# Patient Record
Sex: Female | Born: 1945
Health system: Southern US, Community
[De-identification: ages and names within clinical notes are randomized; demographics above are authoritative.]

## PROBLEM LIST (undated history)

## (undated) DIAGNOSIS — K648 Other hemorrhoids: Secondary | ICD-10-CM

## (undated) DIAGNOSIS — I739 Peripheral vascular disease, unspecified: Secondary | ICD-10-CM

## (undated) DIAGNOSIS — Z87442 Personal history of urinary calculi: Secondary | ICD-10-CM

## (undated) DIAGNOSIS — Z86010 Personal history of colonic polyps: Secondary | ICD-10-CM

## (undated) DIAGNOSIS — K644 Residual hemorrhoidal skin tags: Secondary | ICD-10-CM

## (undated) DIAGNOSIS — N390 Urinary tract infection, site not specified: Secondary | ICD-10-CM

## (undated) DIAGNOSIS — K6389 Other specified diseases of intestine: Secondary | ICD-10-CM

## (undated) DIAGNOSIS — F4310 Post-traumatic stress disorder, unspecified: Secondary | ICD-10-CM

## (undated) DIAGNOSIS — F0390 Unspecified dementia without behavioral disturbance: Secondary | ICD-10-CM

## (undated) DIAGNOSIS — K589 Irritable bowel syndrome without diarrhea: Secondary | ICD-10-CM

## (undated) DIAGNOSIS — K219 Gastro-esophageal reflux disease without esophagitis: Secondary | ICD-10-CM

## (undated) DIAGNOSIS — I209 Angina pectoris, unspecified: Secondary | ICD-10-CM

## (undated) DIAGNOSIS — T4145XA Adverse effect of unspecified anesthetic, initial encounter: Secondary | ICD-10-CM

## (undated) DIAGNOSIS — M1990 Unspecified osteoarthritis, unspecified site: Secondary | ICD-10-CM

## (undated) DIAGNOSIS — Q782 Osteopetrosis: Secondary | ICD-10-CM

## (undated) DIAGNOSIS — K602 Anal fissure, unspecified: Secondary | ICD-10-CM

## (undated) DIAGNOSIS — G43909 Migraine, unspecified, not intractable, without status migrainosus: Secondary | ICD-10-CM

## (undated) DIAGNOSIS — M797 Fibromyalgia: Secondary | ICD-10-CM

## (undated) DIAGNOSIS — F329 Major depressive disorder, single episode, unspecified: Secondary | ICD-10-CM

## (undated) DIAGNOSIS — Z8639 Personal history of other endocrine, nutritional and metabolic disease: Secondary | ICD-10-CM

## (undated) DIAGNOSIS — T7840XA Allergy, unspecified, initial encounter: Secondary | ICD-10-CM

## (undated) DIAGNOSIS — I499 Cardiac arrhythmia, unspecified: Secondary | ICD-10-CM

## (undated) DIAGNOSIS — I10 Essential (primary) hypertension: Secondary | ICD-10-CM

## (undated) DIAGNOSIS — H269 Unspecified cataract: Secondary | ICD-10-CM

## (undated) DIAGNOSIS — E161 Other hypoglycemia: Secondary | ICD-10-CM

## (undated) DIAGNOSIS — IMO0002 Reserved for concepts with insufficient information to code with codable children: Secondary | ICD-10-CM

## (undated) DIAGNOSIS — F419 Anxiety disorder, unspecified: Secondary | ICD-10-CM

## (undated) DIAGNOSIS — D649 Anemia, unspecified: Secondary | ICD-10-CM

## (undated) DIAGNOSIS — N200 Calculus of kidney: Secondary | ICD-10-CM

## (undated) DIAGNOSIS — T8859XA Other complications of anesthesia, initial encounter: Secondary | ICD-10-CM

## (undated) DIAGNOSIS — K449 Diaphragmatic hernia without obstruction or gangrene: Secondary | ICD-10-CM

## (undated) DIAGNOSIS — F32A Depression, unspecified: Secondary | ICD-10-CM

## (undated) DIAGNOSIS — R51 Headache: Secondary | ICD-10-CM

## (undated) DIAGNOSIS — I340 Nonrheumatic mitral (valve) insufficiency: Secondary | ICD-10-CM

## (undated) DIAGNOSIS — E162 Hypoglycemia, unspecified: Secondary | ICD-10-CM

## (undated) DIAGNOSIS — R011 Cardiac murmur, unspecified: Secondary | ICD-10-CM

## (undated) DIAGNOSIS — H3530 Unspecified macular degeneration: Secondary | ICD-10-CM

## (undated) DIAGNOSIS — K2270 Barrett's esophagus without dysplasia: Secondary | ICD-10-CM

## (undated) DIAGNOSIS — N189 Chronic kidney disease, unspecified: Secondary | ICD-10-CM

## (undated) DIAGNOSIS — E119 Type 2 diabetes mellitus without complications: Secondary | ICD-10-CM

## (undated) DIAGNOSIS — R5383 Other fatigue: Secondary | ICD-10-CM

## (undated) HISTORY — DX: Anxiety disorder, unspecified: F41.9

## (undated) HISTORY — DX: Irritable bowel syndrome, unspecified: K58.9

## (undated) HISTORY — DX: Calculus of kidney: N20.0

## (undated) HISTORY — PX: CHOLECYSTECTOMY: SHX55

## (undated) HISTORY — DX: Unspecified macular degeneration: H35.30

## (undated) HISTORY — DX: Allergy, unspecified, initial encounter: T78.40XA

## (undated) HISTORY — PX: APPENDECTOMY: SHX54

## (undated) HISTORY — DX: Osteopetrosis: Q78.2

## (undated) HISTORY — DX: Depression, unspecified: F32.A

## (undated) HISTORY — DX: Hypoglycemia, unspecified: E16.2

## (undated) HISTORY — PX: ABDOMINAL HYSTERECTOMY: SHX81

## (undated) HISTORY — PX: EYE SURGERY: SHX253

## (undated) HISTORY — PX: LASIK: SHX215

## (undated) HISTORY — DX: Unspecified osteoarthritis, unspecified site: M19.90

## (undated) HISTORY — DX: Fibromyalgia: M79.7

## (undated) HISTORY — DX: Unspecified cataract: H26.9

## (undated) HISTORY — PX: OTHER SURGICAL HISTORY: SHX169

## (undated) HISTORY — DX: Personal history of colonic polyps: Z86.010

## (undated) HISTORY — PX: TUBAL LIGATION: SHX77

## (undated) HISTORY — DX: Diaphragmatic hernia without obstruction or gangrene: K44.9

## (undated) HISTORY — DX: Unspecified dementia, unspecified severity, without behavioral disturbance, psychotic disturbance, mood disturbance, and anxiety: F03.90

## (undated) HISTORY — DX: Essential (primary) hypertension: I10

## (undated) HISTORY — DX: Barrett's esophagus without dysplasia: K22.70

## (undated) HISTORY — DX: Other specified diseases of intestine: K63.89

## (undated) HISTORY — DX: Reserved for concepts with insufficient information to code with codable children: IMO0002

## (undated) HISTORY — DX: Personal history of urinary calculi: Z87.442

## (undated) HISTORY — DX: Gastro-esophageal reflux disease without esophagitis: K21.9

## (undated) HISTORY — DX: Nonrheumatic mitral (valve) insufficiency: I34.0

## (undated) HISTORY — PX: ESOPHAGUS SURGERY: SHX626

## (undated) HISTORY — DX: Other hemorrhoids: K64.8

## (undated) HISTORY — DX: Major depressive disorder, single episode, unspecified: F32.9

## (undated) HISTORY — DX: Urinary tract infection, site not specified: N39.0

## (undated) HISTORY — DX: Anal fissure, unspecified: K60.2

## (undated) HISTORY — DX: Residual hemorrhoidal skin tags: K64.4

## (undated) HISTORY — PX: DILATION AND CURETTAGE OF UTERUS: SHX78

## (undated) HISTORY — DX: Type 2 diabetes mellitus without complications: E11.9

## (undated) HISTORY — PX: TONSILLECTOMY: SUR1361

## (undated) HISTORY — DX: Cardiac murmur, unspecified: R01.1

## (undated) HISTORY — PX: SPINAL CORD STIMULATOR IMPLANT: SHX2422

## (undated) HISTORY — DX: Migraine, unspecified, not intractable, without status migrainosus: G43.909

## (undated) HISTORY — DX: Other fatigue: R53.83

## (undated) HISTORY — DX: Post-traumatic stress disorder, unspecified: F43.10

---

## 1989-01-14 HISTORY — PX: GASTRIC RESTRICTION SURGERY: SHX653

## 1989-01-14 HISTORY — PX: OTHER SURGICAL HISTORY: SHX169

## 1991-01-15 HISTORY — PX: EXPLORATORY LAPAROTOMY: SUR591

## 1991-03-22 HISTORY — PX: CARDIAC CATHETERIZATION: SHX172

## 1997-01-14 HISTORY — PX: GASTRIC BYPASS: SHX52

## 1997-08-07 ENCOUNTER — Emergency Department (HOSPITAL_COMMUNITY): Admission: EM | Admit: 1997-08-07 | Discharge: 1997-08-07 | Payer: Self-pay | Admitting: Emergency Medicine

## 1998-03-22 ENCOUNTER — Encounter: Payer: Self-pay | Admitting: Internal Medicine

## 1998-03-22 ENCOUNTER — Ambulatory Visit (HOSPITAL_COMMUNITY): Admission: RE | Admit: 1998-03-22 | Discharge: 1998-03-22 | Payer: Self-pay | Admitting: Internal Medicine

## 1999-02-21 ENCOUNTER — Emergency Department (HOSPITAL_COMMUNITY): Admission: EM | Admit: 1999-02-21 | Discharge: 1999-02-21 | Payer: Self-pay | Admitting: Emergency Medicine

## 1999-05-31 ENCOUNTER — Other Ambulatory Visit: Admission: RE | Admit: 1999-05-31 | Discharge: 1999-05-31 | Payer: Self-pay | Admitting: Obstetrics and Gynecology

## 1999-06-11 ENCOUNTER — Ambulatory Visit (HOSPITAL_COMMUNITY): Admission: RE | Admit: 1999-06-11 | Discharge: 1999-06-11 | Payer: Self-pay | Admitting: Gastroenterology

## 1999-06-11 ENCOUNTER — Encounter: Payer: Self-pay | Admitting: Gastroenterology

## 1999-08-09 ENCOUNTER — Other Ambulatory Visit: Admission: RE | Admit: 1999-08-09 | Discharge: 1999-08-09 | Payer: Self-pay | Admitting: Gastroenterology

## 1999-08-09 ENCOUNTER — Encounter (INDEPENDENT_AMBULATORY_CARE_PROVIDER_SITE_OTHER): Payer: Self-pay | Admitting: Specialist

## 1999-08-10 ENCOUNTER — Encounter: Payer: Self-pay | Admitting: Gastroenterology

## 1999-08-10 ENCOUNTER — Ambulatory Visit (HOSPITAL_COMMUNITY): Admission: RE | Admit: 1999-08-10 | Discharge: 1999-08-10 | Payer: Self-pay | Admitting: Gastroenterology

## 1999-12-14 ENCOUNTER — Ambulatory Visit (HOSPITAL_COMMUNITY): Admission: RE | Admit: 1999-12-14 | Discharge: 1999-12-14 | Payer: Self-pay | Admitting: Gastroenterology

## 1999-12-14 ENCOUNTER — Encounter: Payer: Self-pay | Admitting: Gastroenterology

## 2000-04-21 ENCOUNTER — Encounter: Admission: RE | Admit: 2000-04-21 | Discharge: 2000-07-20 | Payer: Self-pay | Admitting: Internal Medicine

## 2000-05-13 ENCOUNTER — Ambulatory Visit (HOSPITAL_COMMUNITY): Admission: RE | Admit: 2000-05-13 | Discharge: 2000-05-13 | Payer: Self-pay | Admitting: Internal Medicine

## 2000-05-13 ENCOUNTER — Encounter: Payer: Self-pay | Admitting: Internal Medicine

## 2001-03-18 ENCOUNTER — Other Ambulatory Visit: Admission: RE | Admit: 2001-03-18 | Discharge: 2001-03-18 | Payer: Self-pay | Admitting: Obstetrics and Gynecology

## 2001-04-16 ENCOUNTER — Encounter: Payer: Self-pay | Admitting: Internal Medicine

## 2001-04-16 ENCOUNTER — Encounter (INDEPENDENT_AMBULATORY_CARE_PROVIDER_SITE_OTHER): Payer: Self-pay | Admitting: *Deleted

## 2001-04-16 ENCOUNTER — Ambulatory Visit (HOSPITAL_COMMUNITY): Admission: RE | Admit: 2001-04-16 | Discharge: 2001-04-16 | Payer: Self-pay | Admitting: Gastroenterology

## 2002-12-08 ENCOUNTER — Ambulatory Visit (HOSPITAL_COMMUNITY): Admission: RE | Admit: 2002-12-08 | Discharge: 2002-12-08 | Payer: Self-pay | Admitting: Obstetrics and Gynecology

## 2002-12-31 ENCOUNTER — Encounter: Payer: Self-pay | Admitting: Internal Medicine

## 2003-01-27 ENCOUNTER — Encounter (INDEPENDENT_AMBULATORY_CARE_PROVIDER_SITE_OTHER): Payer: Self-pay | Admitting: Gastroenterology

## 2003-04-03 ENCOUNTER — Emergency Department (HOSPITAL_COMMUNITY): Admission: EM | Admit: 2003-04-03 | Discharge: 2003-04-03 | Payer: Self-pay | Admitting: Emergency Medicine

## 2003-08-30 HISTORY — PX: US ECHOCARDIOGRAPHY: HXRAD669

## 2003-10-28 ENCOUNTER — Encounter: Admission: RE | Admit: 2003-10-28 | Discharge: 2004-01-26 | Payer: Self-pay | Admitting: Neurology

## 2003-10-28 ENCOUNTER — Ambulatory Visit: Payer: Self-pay | Admitting: Psychology

## 2004-04-19 ENCOUNTER — Ambulatory Visit (HOSPITAL_COMMUNITY): Admission: RE | Admit: 2004-04-19 | Discharge: 2004-04-19 | Payer: Self-pay | Admitting: Internal Medicine

## 2004-05-01 ENCOUNTER — Encounter: Admission: RE | Admit: 2004-05-01 | Discharge: 2004-05-01 | Payer: Self-pay | Admitting: Internal Medicine

## 2005-03-27 ENCOUNTER — Ambulatory Visit: Payer: Self-pay | Admitting: Gastroenterology

## 2005-04-22 ENCOUNTER — Encounter: Admission: RE | Admit: 2005-04-22 | Discharge: 2005-04-22 | Payer: Self-pay | Admitting: Internal Medicine

## 2005-04-22 ENCOUNTER — Ambulatory Visit (HOSPITAL_COMMUNITY): Admission: RE | Admit: 2005-04-22 | Discharge: 2005-04-22 | Payer: Self-pay | Admitting: Gastroenterology

## 2005-04-25 ENCOUNTER — Ambulatory Visit: Payer: Self-pay | Admitting: Gastroenterology

## 2005-04-25 ENCOUNTER — Encounter: Payer: Self-pay | Admitting: Internal Medicine

## 2005-04-25 ENCOUNTER — Encounter (INDEPENDENT_AMBULATORY_CARE_PROVIDER_SITE_OTHER): Payer: Self-pay | Admitting: Specialist

## 2005-05-08 ENCOUNTER — Ambulatory Visit (HOSPITAL_COMMUNITY): Admission: RE | Admit: 2005-05-08 | Discharge: 2005-05-08 | Payer: Self-pay | Admitting: Internal Medicine

## 2006-05-08 ENCOUNTER — Encounter: Admission: RE | Admit: 2006-05-08 | Discharge: 2006-05-08 | Payer: Self-pay | Admitting: Internal Medicine

## 2006-06-13 ENCOUNTER — Ambulatory Visit: Payer: Self-pay | Admitting: Gastroenterology

## 2006-06-13 LAB — CONVERTED CEMR LAB
BUN: 17 mg/dL
Creatinine, Ser: 0.9 mg/dL

## 2006-06-16 ENCOUNTER — Ambulatory Visit (HOSPITAL_COMMUNITY): Admission: RE | Admit: 2006-06-16 | Discharge: 2006-06-16 | Payer: Self-pay | Admitting: Gastroenterology

## 2006-06-24 ENCOUNTER — Ambulatory Visit: Payer: Self-pay | Admitting: Internal Medicine

## 2006-09-26 ENCOUNTER — Emergency Department (HOSPITAL_COMMUNITY): Admission: EM | Admit: 2006-09-26 | Discharge: 2006-09-26 | Payer: Self-pay | Admitting: Emergency Medicine

## 2006-10-03 HISTORY — PX: CARDIOVASCULAR STRESS TEST: SHX262

## 2006-10-08 ENCOUNTER — Ambulatory Visit (HOSPITAL_COMMUNITY): Admission: RE | Admit: 2006-10-08 | Discharge: 2006-10-08 | Payer: Self-pay | Admitting: Orthopaedic Surgery

## 2006-10-23 ENCOUNTER — Encounter
Admission: RE | Admit: 2006-10-23 | Discharge: 2006-10-23 | Payer: Self-pay | Admitting: Physical Medicine and Rehabilitation

## 2007-01-15 HISTORY — PX: OTHER SURGICAL HISTORY: SHX169

## 2007-01-21 HISTORY — PX: US ECHOCARDIOGRAPHY: HXRAD669

## 2007-02-04 ENCOUNTER — Ambulatory Visit: Payer: Self-pay | Admitting: Internal Medicine

## 2007-03-02 ENCOUNTER — Ambulatory Visit (HOSPITAL_COMMUNITY): Admission: RE | Admit: 2007-03-02 | Discharge: 2007-03-02 | Payer: Self-pay | Admitting: Orthopaedic Surgery

## 2007-03-10 ENCOUNTER — Ambulatory Visit (HOSPITAL_BASED_OUTPATIENT_CLINIC_OR_DEPARTMENT_OTHER): Admission: RE | Admit: 2007-03-10 | Discharge: 2007-03-10 | Payer: Self-pay | Admitting: Orthopaedic Surgery

## 2007-03-18 DIAGNOSIS — M1990 Unspecified osteoarthritis, unspecified site: Secondary | ICD-10-CM | POA: Insufficient documentation

## 2007-03-18 DIAGNOSIS — F329 Major depressive disorder, single episode, unspecified: Secondary | ICD-10-CM | POA: Insufficient documentation

## 2007-03-18 DIAGNOSIS — IMO0001 Reserved for inherently not codable concepts without codable children: Secondary | ICD-10-CM | POA: Insufficient documentation

## 2007-03-18 DIAGNOSIS — M5137 Other intervertebral disc degeneration, lumbosacral region: Secondary | ICD-10-CM | POA: Insufficient documentation

## 2007-03-18 DIAGNOSIS — M5030 Other cervical disc degeneration, unspecified cervical region: Secondary | ICD-10-CM

## 2007-03-18 DIAGNOSIS — K449 Diaphragmatic hernia without obstruction or gangrene: Secondary | ICD-10-CM | POA: Insufficient documentation

## 2007-03-18 DIAGNOSIS — M791 Myalgia, unspecified site: Secondary | ICD-10-CM

## 2007-03-18 DIAGNOSIS — F411 Generalized anxiety disorder: Secondary | ICD-10-CM | POA: Insufficient documentation

## 2007-03-18 DIAGNOSIS — M51379 Other intervertebral disc degeneration, lumbosacral region without mention of lumbar back pain or lower extremity pain: Secondary | ICD-10-CM

## 2007-03-18 DIAGNOSIS — F4321 Adjustment disorder with depressed mood: Secondary | ICD-10-CM

## 2007-03-18 DIAGNOSIS — K219 Gastro-esophageal reflux disease without esophagitis: Secondary | ICD-10-CM

## 2007-03-18 HISTORY — DX: Diaphragmatic hernia without obstruction or gangrene: K44.9

## 2007-03-18 HISTORY — DX: Adjustment disorder with depressed mood: F43.21

## 2007-03-18 HISTORY — DX: Myalgia, unspecified site: M79.10

## 2007-03-18 HISTORY — DX: Other intervertebral disc degeneration, lumbosacral region without mention of lumbar back pain or lower extremity pain: M51.379

## 2007-03-18 HISTORY — DX: Gastro-esophageal reflux disease without esophagitis: K21.9

## 2007-03-18 HISTORY — DX: Generalized anxiety disorder: F41.1

## 2007-03-18 HISTORY — DX: Other cervical disc degeneration, unspecified cervical region: M50.30

## 2007-04-06 ENCOUNTER — Ambulatory Visit: Payer: Self-pay | Admitting: Internal Medicine

## 2007-06-10 ENCOUNTER — Encounter: Admission: RE | Admit: 2007-06-10 | Discharge: 2007-06-10 | Payer: Self-pay | Admitting: Internal Medicine

## 2007-06-26 ENCOUNTER — Telehealth: Payer: Self-pay | Admitting: Internal Medicine

## 2007-07-01 ENCOUNTER — Ambulatory Visit (HOSPITAL_COMMUNITY): Admission: RE | Admit: 2007-07-01 | Discharge: 2007-07-01 | Payer: Self-pay | Admitting: Internal Medicine

## 2007-07-01 ENCOUNTER — Telehealth: Payer: Self-pay | Admitting: Internal Medicine

## 2007-07-03 ENCOUNTER — Ambulatory Visit (HOSPITAL_COMMUNITY): Admission: RE | Admit: 2007-07-03 | Discharge: 2007-07-03 | Payer: Self-pay | Admitting: Internal Medicine

## 2007-08-14 ENCOUNTER — Ambulatory Visit: Payer: Self-pay | Admitting: Internal Medicine

## 2007-08-21 ENCOUNTER — Telehealth: Payer: Self-pay | Admitting: Internal Medicine

## 2007-10-30 ENCOUNTER — Telehealth: Payer: Self-pay | Admitting: Internal Medicine

## 2007-10-30 ENCOUNTER — Emergency Department (HOSPITAL_COMMUNITY): Admission: EM | Admit: 2007-10-30 | Discharge: 2007-10-30 | Payer: Self-pay | Admitting: Emergency Medicine

## 2007-11-04 ENCOUNTER — Encounter: Payer: Self-pay | Admitting: Internal Medicine

## 2007-11-04 ENCOUNTER — Ambulatory Visit: Payer: Self-pay | Admitting: Hematology

## 2007-11-13 ENCOUNTER — Telehealth: Payer: Self-pay | Admitting: Internal Medicine

## 2007-11-13 ENCOUNTER — Encounter: Payer: Self-pay | Admitting: Internal Medicine

## 2007-12-28 ENCOUNTER — Encounter: Payer: Self-pay | Admitting: Internal Medicine

## 2008-01-03 ENCOUNTER — Emergency Department (HOSPITAL_COMMUNITY): Admission: EM | Admit: 2008-01-03 | Discharge: 2008-01-03 | Payer: Self-pay | Admitting: Emergency Medicine

## 2008-01-06 ENCOUNTER — Other Ambulatory Visit: Payer: Self-pay | Admitting: Orthopedic Surgery

## 2008-01-06 ENCOUNTER — Inpatient Hospital Stay (HOSPITAL_COMMUNITY): Admission: AD | Admit: 2008-01-06 | Discharge: 2008-01-07 | Payer: Self-pay | Admitting: Orthopedic Surgery

## 2008-01-06 ENCOUNTER — Ambulatory Visit: Payer: Self-pay | Admitting: Family Medicine

## 2008-04-13 ENCOUNTER — Encounter (INDEPENDENT_AMBULATORY_CARE_PROVIDER_SITE_OTHER): Payer: Self-pay | Admitting: *Deleted

## 2008-05-30 ENCOUNTER — Ambulatory Visit: Payer: Self-pay | Admitting: Internal Medicine

## 2008-06-01 ENCOUNTER — Ambulatory Visit: Payer: Self-pay | Admitting: Internal Medicine

## 2008-06-01 DIAGNOSIS — Z9283 Personal history of failed moderate sedation: Secondary | ICD-10-CM | POA: Insufficient documentation

## 2008-06-02 ENCOUNTER — Telehealth: Payer: Self-pay | Admitting: Internal Medicine

## 2008-06-10 ENCOUNTER — Encounter: Admission: RE | Admit: 2008-06-10 | Discharge: 2008-06-10 | Payer: Self-pay | Admitting: Internal Medicine

## 2008-06-23 ENCOUNTER — Ambulatory Visit: Payer: Self-pay | Admitting: Internal Medicine

## 2008-06-23 ENCOUNTER — Encounter: Payer: Self-pay | Admitting: Internal Medicine

## 2008-06-23 ENCOUNTER — Ambulatory Visit (HOSPITAL_COMMUNITY): Admission: RE | Admit: 2008-06-23 | Discharge: 2008-06-23 | Payer: Self-pay | Admitting: Internal Medicine

## 2008-06-23 HISTORY — PX: UPPER GASTROINTESTINAL ENDOSCOPY: SHX188

## 2008-06-23 HISTORY — PX: COLONOSCOPY: SHX5424

## 2008-06-24 ENCOUNTER — Ambulatory Visit (HOSPITAL_BASED_OUTPATIENT_CLINIC_OR_DEPARTMENT_OTHER): Admission: RE | Admit: 2008-06-24 | Discharge: 2008-06-24 | Payer: Self-pay | Admitting: Urology

## 2008-06-24 ENCOUNTER — Encounter (INDEPENDENT_AMBULATORY_CARE_PROVIDER_SITE_OTHER): Payer: Self-pay | Admitting: Urology

## 2008-07-05 ENCOUNTER — Encounter: Payer: Self-pay | Admitting: Internal Medicine

## 2008-08-19 ENCOUNTER — Telehealth: Payer: Self-pay | Admitting: Internal Medicine

## 2008-09-16 ENCOUNTER — Emergency Department (HOSPITAL_COMMUNITY): Admission: EM | Admit: 2008-09-16 | Discharge: 2008-09-16 | Payer: Self-pay | Admitting: Emergency Medicine

## 2009-03-27 ENCOUNTER — Encounter: Admission: RE | Admit: 2009-03-27 | Discharge: 2009-03-27 | Payer: Self-pay | Admitting: Internal Medicine

## 2009-07-03 ENCOUNTER — Encounter: Payer: Self-pay | Admitting: Internal Medicine

## 2009-07-03 ENCOUNTER — Emergency Department (HOSPITAL_COMMUNITY): Admission: EM | Admit: 2009-07-03 | Discharge: 2009-07-03 | Payer: Self-pay | Admitting: Emergency Medicine

## 2009-07-19 ENCOUNTER — Observation Stay (HOSPITAL_COMMUNITY): Admission: AD | Admit: 2009-07-19 | Discharge: 2009-07-20 | Payer: Self-pay | Admitting: Urology

## 2009-07-20 ENCOUNTER — Encounter: Payer: Self-pay | Admitting: Internal Medicine

## 2009-09-15 ENCOUNTER — Ambulatory Visit: Payer: Self-pay | Admitting: Cardiovascular Disease

## 2009-11-07 ENCOUNTER — Encounter: Payer: Self-pay | Admitting: Internal Medicine

## 2009-11-23 ENCOUNTER — Encounter: Payer: Self-pay | Admitting: Internal Medicine

## 2009-11-27 ENCOUNTER — Ambulatory Visit: Payer: Self-pay | Admitting: Internal Medicine

## 2009-11-27 DIAGNOSIS — R634 Abnormal weight loss: Secondary | ICD-10-CM | POA: Insufficient documentation

## 2009-11-27 DIAGNOSIS — R110 Nausea: Secondary | ICD-10-CM | POA: Insufficient documentation

## 2010-01-14 HISTORY — PX: STONE EXTRACTION WITH BASKET: SHX5318

## 2010-02-03 ENCOUNTER — Encounter: Payer: Self-pay | Admitting: Obstetrics and Gynecology

## 2010-02-04 ENCOUNTER — Encounter: Payer: Self-pay | Admitting: Internal Medicine

## 2010-02-04 ENCOUNTER — Encounter: Payer: Self-pay | Admitting: Gastroenterology

## 2010-02-06 ENCOUNTER — Encounter: Payer: Self-pay | Admitting: Internal Medicine

## 2010-02-07 ENCOUNTER — Telehealth: Payer: Self-pay | Admitting: Internal Medicine

## 2010-02-09 ENCOUNTER — Ambulatory Visit: Admit: 2010-02-09 | Payer: Self-pay | Admitting: Internal Medicine

## 2010-02-15 NOTE — Letter (Signed)
Summary: Bunnie Pion MD/[facility]  Bunnie Pion MD/[facility]   Imported By: Bubba Hales 12/14/2009 09:45:19  _____________________________________________________________________  External Attachment:    Type:   Image     Comment:   External Document

## 2010-02-15 NOTE — Letter (Signed)
Summary: Colonoscopy Date Change Letter  [facility]  [address]   [place], [place] [address]   Phone: [phone]  Fax: [phone]      February 06, 2010 MRN: [id number]   [facility] [place], [place]  [address]   Dear Ms. Beegle,   Previously you were recommended to have a repeat colonoscopy around this time. Your chart was recently reviewed by Dr. Carlean Purl of [facility]. Follow up colonoscopy is now recommended in June 2015. This revised recommendation is based on current, nationally recognized guidelines for colorectal cancer screening and polyp surveillance. These guidelines are endorsed by the [place] Task Force on Colorectal Cancer as well as numerous other major medical organizations.  Please understand that our recommendation assumes that you do not have any new symptoms such as bleeding, a change in bowel habits, anemia, or significant abdominal discomfort. If you do have any concerning GI symptoms or want to discuss the guideline recommendations, please call to arrange an office visit at your earliest convenience. Otherwise we will keep you in our reminder system and contact you 1-2 months prior to the date listed above to schedule your next colonoscopy.  Thank you,   Silvano Rusk, M.D.  [facility] [phone]

## 2010-02-15 NOTE — Assessment & Plan Note (Signed)
Summary: weight loss--ch.    History of Present Illness Visit Type: Follow-up Visit Primary GI MD: Silvano Rusk MD [facility] Primary Provider:  Tami Lin, MD Chief Complaint: Pt c/o weight loss and nausea x several months. History of Present Illness:   65 yo ww with 4 months of intermittent nausea, urgent post-prandial stools and weight loss. This began after she had interventions for kidney stones. She has recently began to increase carbohydrates to help with weight but her blood sugars have been high at times due to this diet change. "I think this is pushing ne over into diabetes"  She was 120# but is regaining some.    GI Review of Systems    Reports abdominal pain, acid reflux, belching, bloating, dysphagia with liquids, dysphagia with solids, heartburn, loss of appetite, nausea, and  weight loss.     Location of  Abdominal pain: generalized. Weight loss of 35  pounds over 4 months.   Denies chest pain, vomiting, vomiting blood, and  weight gain.      Reports constipation, diarrhea, hemorrhoids, and  rectal pain.     Denies anal fissure, black tarry stools, change in bowel habit, diverticulosis, fecal incontinence, heme positive stool, irritable bowel syndrome, jaundice, light color stool, liver problems, and  rectal bleeding. Preventive Screening-Counseling & Management  Caffeine-Diet-Exercise     Does Patient Exercise: no    Current Medications (verified): 1)  Clonazepam 1 Mg  Tabs (Clonazepam) .... Take 1 Tablet By Mouth Two-Three Times A Day 2)  Imitrex 100 Mg  Tabs (Sumatriptan Succinate) .... Take 1 Tab Twice Weely As Needed 3)  Lidoderm 5 % Ptch (Lidocaine) .... Apply 1 Patch Transdermally Daily 4)  Nasacort Aq 55 Mcg/act  Aers (Triamcinolone Acetonide(Nasal)) .... 2 Sprays Each Nostril Daily 5)  Neurontin 300 Mg  Caps (Gabapentin) .... Take 1 Tablet By Mouth Three Times A Day 6)  Nexium 40 Mg  Cpdr (Esomeprazole Magnesium) .... Take 1 Tablet By Mouth Once A Day 7)   Propranolol-Hctz 40-25 Mg  Tabs (Propranolol-Hctz) .... Take 1 Tablet By Mouth As Needed 8)  Methocarbamol 750 Mg Tabs (Methocarbamol) .... Take 1 Tablet By Mouth At Bedtime As Needed 9)  Vicodin 5-500 Mg Tabs (Hydrocodone-Acetaminophen) .... Take 1-2 Tablets By Mouth Every 4-6 Hours As Needed 10)  Thorazine 25 Mg .... Take 1 Tablet By Mouth As Needed Migraine 11)  Fentanyl 50 Mcg/hr Pt72 (Fentanyl) .... Apply 1 Patch Transdermally Every 3 Days 12)  Vitamin D3 1000 Unit Caps (Cholecalciferol) .... Take 1 Capsule By Mouth Two Times A Day 13)  Miralax   Powd (Polyethylene Glycol 3350) .Marland KitchenMarland KitchenMarland Kitchen 17 G Dissolved in 8oz of Liquid Once Daily 14)  Trazodone Hcl 50 Mg Tabs (Trazodone Hcl) .... Take 1 Tablet By Mouth At Bedtime 15)  Estrace 0.1 Mg/gm Crea (Estradiol) .... Apply Vaginally Once Daily 16)  Pyridium 200 Mg Tabs (Phenazopyridine Hcl) .... Take 1 Tablet By Mouth Three Times A Day As Needed  Allergies (verified): 1)  ! * Ambien 2)  ! Tylox 3)  ! * Tylenol #3 4)  ! * Loricet Plus 5)  ! * Percidan 6)  ! Percocet  Past History:  Past Medical History: Reviewed history from 08/14/2007 and no changes required. DISC DISEASE, LUMBAR (ICD-722.52) DISC DISEASE, CERVICAL (ICD-722.4) FIBROMYALGIA (ICD-729.1) DEPRESSION (ICD-311) MITRAL VALVE PROLAPSE (ICD-424.0) OSTEOARTHRITIS (ICD-715.90) HYPERTENSION (ICD-401.9) ANXIETY (ICD-300.00) IRRITABLE BOWEL SYNDROME (ICD-564.1) GERD (ICD-530.81) HIATAL HERNIA (ICD-553.3) REFLUX ESOPHAGITIS (ICD-530.11) BARRETTS ESOPHAGUS (ICD-530.85) Kidney Stones Urinary Tract Infection  Past Surgical History:  Appendectomy Cholecystectomy Hysterectomy Gastric stapling 1991 Gastrojejunostomy (Roux-En-Y Procedure) 1999, reoperation for obstruction Tubal Ligation Right Knee Arthoscopy Arm surgery-right arm Kidney Stone Removed  Family History: Reviewed history from 06/01/2008 and no changes required. Says 73 immediate (parents, siblings, half-sibs 1 sister  pancreatic cancer mother, father, sister lung cancer multiple half-sibs had cancers grandparents, aunts and uncles all with cancers, total of 67 family members with cancer 84 had colon cancer (2 half-sisters, 3 aunts, 4 uncles, 3 cousins, 2 grandmothers, 1 grandfather) Family History of Uterine Cancer: Aunt Family History of Clotting disorder: Sister Family History of Heart Disease: Sister x 2, Mother,Father, Grandmother Family History of Kidney Disease: Father, Sister x 2 Family History of Breast Cancer: Sister  Social History: Occupation: Medically Retired Patient has never smoked.  Alcohol Use - no Daily Caffeine Use- 1 cup daily Illicit Drug Use - no Patient does not get regular exercise.  Does Patient Exercise:  no  Vital Signs:  Patient profile:   65 year old female Height:      65 inches Weight:      125.38 pounds BMI:     20.94 BSA:     1.62 Pulse rate:   84 / minute Pulse rhythm:   regular BP sitting:   98 / 68  (right arm)  Vitals Entered By: Madlyn Frankel CMA Deborra Medina) (November 27, 2009 11:43 AM)  Physical Exam  General:  thin, NAD Lungs:  Clear throughout to auscultation. Heart:  Regular rate and rhythm; no murmurs, rubs,  or bruits. Abdomen:  soft and nontender without mass BS+ surgical scars present   Impression & Recommendations:  Problem # 1:  WEIGHT LOSS (ICD-783.21) Assessment New Possible causes include hyperglycemia, diarrhea, anorexia/altered eating (most likely). Will see waht metronidazole does.  Problem # 2:  DIARRHEA (ICD-787.91) Assessment: New ? Small intestinal bacterial ovrgrowth ? dumpng try metronidazole for possible bacterial overgrowth - her anatomy (post-gastrectomy) and recent antibiotics increase the likelihood of this  Problem # 3:  NAUSEA (ICD-787.02) Assessment: New could be Calcium, PTH but with normalization does not seem likely functional, small bowel overgrowth all possible.  Problem # 4:  HYPERGLYCEMIA  (ICD-790.29) Assessment: Unchanged #s indicate DM but per Dr. Raye Sorrow she claims swings from 60-400 but not substantiaed  Problem # 5:  ? of HYPERPARATHYROIDISM UNSPECIFIED (ICD-252.00) Assessment: New will confer with [name] - have done so and he indicates PTH studies came back to normal He has told her he doubted an endocrinologist would be able to do anything more but that she could see one if desired  Problem # 6:  ? of CHRONIC FACTITIOUS ILLNESS W/PHYSICAL SYMPTOMS (ICD-301.51) Assessment: Comment Only Over the years she has claimed problems that have not been substantiated by testing or other methods of documenting (family history issues, blood sugars, memory disturbances) this makes it difficult to interpret her problems   Patient Instructions: 1)  Please pick up your medications at your pharmacy.  2)  Let us know if the Metronidazole is not working.  It will take about two weeks for you to see benefits. 3)  We will request labs from Dr. [name] and call you with further follow up after reviewing. 4)  Please follow up with Dr. [name] and your endocrinologist regarding your blood sugars and calcium issues. 5)  Copy sent to : Tami Lin, MD 6)  The medication list was reviewed and reconciled.  All changed / newly prescribed medications were explained.  A complete medication list was provided to the patient /  caregiver. Prescriptions: METRONIDAZOLE 250 MG TABS (METRONIDAZOLE) 1 by mouth three times a day for 10 days  #30 x 0   Entered and Authorized by:   Gatha Mayer MD, [facility]   Signed by:   Gatha Mayer MD, FACG on 11/27/2009   Method used:   Electronically to        [id number] (retail)       [address]       [place], [place]  [address]       Ph: [phone]       Fax: [phone]   RxID:   [id number]   Appended Document: weight loss--ch. cc Dr. Irine Seal and Dr. Armandina Gemma

## 2010-02-15 NOTE — Letter (Signed)
Summary: Nani Skillern PAC/[facility]  Nani Skillern PAC/[facility]   Imported By: Bubba Hales 12/14/2009 09:54:06  _____________________________________________________________________  External Attachment:    Type:   Image     Comment:   External Document

## 2010-02-15 NOTE — Progress Notes (Signed)
Summary: Received Recall letter   Phone Note Call from Patient Call back at Home Phone [phone]   Call For: Dr Carlean Purl Reason for Call: Talk to Nurse Summary of Call: Received a Recall letter and feels that her Procedure should not have been changed to 06-2013. All her problems have come back with a vengance. Her throat is closing up and is having trouble swallowing. Has constipation. Upper & lower Abd Pain & cramping. Severe weight loss, went from 156 to 120pounds.Her Rectum doesnt close so when she has a BM she ends up with a big mess. Feels that it would be benefical to her health if she were to have an ECL Initial call taken by: Irwin Brakeman [facility],  February 07, 2010 11:03 AM  Follow-up for Phone Call        patient will come in on Friday at 4:00 to further discuss. Follow-up by: Barb Merino RN, [place],  February 07, 2010 3:27 PM

## 2010-02-15 NOTE — Discharge Summary (Signed)
Summary: 7/6-07/20/09    NAME:  Kayla [name], Kayla [name] NO.:  [id number]      MEDICAL RECORD NO.:  [id number]          PATIENT TYPE:  OBV      LOCATION:  [place]                         FACILITY:  [facility]      PHYSICIAN:  Marshall Cork. Jeffie Pollock, M.D.    DATE OF BIRTH:  [birth year]      DATE OF ADMISSION:  07/19/2009   DATE OF DISCHARGE:  07/20/2009                                  DISCHARGE SUMMARY      Briefly, Kayla [name] is a 65 year old female who has had a several-day history   of right flank pain.  CT urogram demonstrated multiple ureteral stones,   including possible 3-mm right ureteral stone.  She also has an elevated   PTH with a calcium of 9.3 and is to have a surgical consultation.      PAST HISTORY:  Her past history is pertinent for:   1. Anxiety.   2. Arthritis.   3. Barrett esophagitis.   4. Cerebral artery aneurysm.   5. Fibromyalgia.   6. Heartburn.   7. Migraine headache.   8. Mitral valve disorder.   9. Osteoporosis.   10.Peptic ulcer.   11.Post-traumatic stress disorder.   12.Rhythm disorders.      SURGICAL HISTORY:  Surgical history is significant for an appendectomy,   prior cystoscopy with biopsy, prior cystoscopy with hydrodistention,   cholecystectomy, hysterectomy, tonsillectomy, and tubal ligation.      ADMISSION MEDICATIONS:  Include Celebrex, chlorpromazine, clindamycin,   clonazepam, fentanyl, Fioricet , Fosamax, hydrocodone, ibuprofen,   Imitrex, Lidoderm, Nasonex, Neurontin, Nexium, propranolol, Robaxin, and   trazodone.      ALLERGIES:  She has allergies to:   1. AMBIEN.   2. CODEINE.   3. LORCET.   4. PERCOCET.   5. TYLENOL.   6. TYLOX.      For additional details of the history and physical, see the office H and   P.  Her urinalysis was negative.  The CT scan in our office on the day   of admission revealed 2 obstructing right ureteral calculi with moderate   right hydroureter and hydronephrosis as well as right nephrolithiasis.   Because  of her pain, felt to require ureteroscopy and stent placement.      HOSPITAL COURSE:  On the day of admission, she was taken to the   operating room, where she underwent left retrograde pyelogram with   interpretation left ureteroscopic stone extraction, placement of a left   double-J stent, only the single stone was found during the procedure,   the 2 additional stones were not noted despite ureteroscopy to the   kidney.  Postoperatively, she did well, and on July 7 she was felt to be   ready for discharge home with final diagnosis for right ureteral stone.   There were no complications.  Her discharge medications include Vicodin,   Pyridium, and home medications.  She was instructed to follow up in 1   week.  She has a stent, and cystoscopy will be performed at followup.  DISPOSITION:  To home.      CONDITION:  Improved.               Marshall Cork. Jeffie Pollock, M.D.            JJW/MEDQ  D:  08/07/2009  T:  08/07/2009  Job:  [id number]      Electronically Signed by Irine Seal M.D. on 08/17/2009 12:52:19 PM

## 2010-02-21 ENCOUNTER — Other Ambulatory Visit: Payer: Self-pay | Admitting: Internal Medicine

## 2010-02-21 DIAGNOSIS — Z1231 Encounter for screening mammogram for malignant neoplasm of breast: Secondary | ICD-10-CM

## 2010-03-29 ENCOUNTER — Ambulatory Visit: Payer: Self-pay

## 2010-04-01 LAB — URINALYSIS, ROUTINE W REFLEX MICROSCOPIC
Nitrite: NEGATIVE
Protein, ur: NEGATIVE mg/dL
Specific Gravity, Urine: 1.012 (ref 1.005–1.030)
Urobilinogen, UA: 0.2 mg/dL (ref 0.0–1.0)

## 2010-04-09 ENCOUNTER — Ambulatory Visit
Admission: RE | Admit: 2010-04-09 | Discharge: 2010-04-09 | Disposition: A | Discharge status: Home / Self Care | Payer: Medicare Other | Source: Ambulatory Visit | Attending: Internal Medicine | Admitting: Internal Medicine

## 2010-04-09 DIAGNOSIS — Z1231 Encounter for screening mammogram for malignant neoplasm of breast: Secondary | ICD-10-CM

## 2010-04-23 LAB — URINALYSIS, ROUTINE W REFLEX MICROSCOPIC
Bilirubin Urine: NEGATIVE
Ketones, ur: NEGATIVE mg/dL
Nitrite: NEGATIVE
Protein, ur: NEGATIVE mg/dL
pH: 5 (ref 5.0–8.0)

## 2010-04-23 LAB — DIFFERENTIAL
Basophils Absolute: 0 10*3/uL (ref 0.0–0.1)
Basophils Relative: 0 % (ref 0–1)
Eosinophils Absolute: 0.5 10*3/uL (ref 0.0–0.7)
Eosinophils Relative: 7 % — ABNORMAL HIGH (ref 0–5)
Neutrophils Relative %: 56 % (ref 43–77)

## 2010-04-23 LAB — CBC
HCT: 40.1 % (ref 36.0–46.0)
MCV: 91.7 fL (ref 78.0–100.0)
Platelets: 226 10*3/uL (ref 150–400)
RDW: 13.1 % (ref 11.5–15.5)
WBC: 6.9 10*3/uL (ref 4.0–10.5)

## 2010-04-23 LAB — BASIC METABOLIC PANEL
BUN: 6 mg/dL (ref 6–23)
Chloride: 107 mEq/L (ref 96–112)
Creatinine, Ser: 0.56 mg/dL (ref 0.4–1.2)
Glucose, Bld: 102 mg/dL — ABNORMAL HIGH (ref 70–99)
Potassium: 3.7 mEq/L (ref 3.5–5.1)

## 2010-04-23 LAB — GLUCOSE, CAPILLARY: Glucose-Capillary: 99 mg/dL (ref 70–99)

## 2010-05-16 ENCOUNTER — Ambulatory Visit: Payer: Self-pay

## 2010-05-29 NOTE — Op Note (Signed)
Kayla [name], Kayla [name]              ACCOUNT NO.:  [id number]   MEDICAL RECORD NO.:  [id number]          PATIENT TYPE:  AMB   LOCATION:  ENDO                         FACILITY:  [facility]   PHYSICIAN:  Ronald L. Rosana Hoes, M.D.  DATE OF BIRTH:  12/10/1945   DATE OF PROCEDURE:  06/24/2008  DATE OF DISCHARGE:                               OPERATIVE REPORT   DIAGNOSIS:  Questionable interstitial cystitis.   OPERATIVE PROCEDURE:  Cystourethroscopy, hydraulic bladder distention  and bladder biopsy.   SURGEON:  Dr. Tresa Endo.   ANESTHESIA:  LMA.   BLOOD LOSS:  Negligible.   TUBES:  None.   COMPLICATIONS:  None.   FINDINGS:  A 1000 mL capacity bladder at 80 cm of water pressure and no  glomerulations were noted.   INDICATIONS FOR PROCEDURE:  [name] is a lovely 65 year old white  female who has had chronic recurrent urinary tract infections.  She  notes that the infections are better while she is on prophylaxis,  although it does not completely eliminate them.  She also has chronic  urgency, frequency, pelvic pain and burning.  With this symptomatology,  we felt that she could possibly have interstitial cystitis and felt like  this procedure was indicated from both a diagnostic and therapeutic  approach.  She understands the risks, benefits and alternatives and  wishes to proceed.   PROCEDURE IN DETAIL:  The patient was placed in supine position after  proper LMA anesthesia and was placed in the dorsal lithotomy position  and prepped and draped with Betadine in a sterile fashion.  Cystourethroscopy was performed with a 22.5 [place] Olympus panendoscope.  Utilizing the 12 and 70 lenses, the bladder was carefully inspected and  noted be without lesions.  Efflux of clear urine was noted from the  normally placed ureteral orifices bilaterally.  Hydraulic bladder  distention was then performed to 80 cm of water pressure with sterile  water.  She was noted to have exactly 1000 mL  capacity bladder and it  was relieved, there were really no cracks and no glomerulations and  certainly no Hunter's ulcers noted.  A biopsy was then obtained from the  posterior midline with  the cold cup biopsy forceps, submitted to pathology and the base was  cauterized with Bugbee coagulation cautery.  There were no other lesions  noted.  The bladder was drained.  The panendoscope was removed and the  patient was taken to the recovery room stable.      [place] Rosana Hoes, M.D.  Electronically Signed     RLD/MEDQ  D:  06/24/2008  T:  06/24/2008  Job:  [id number]

## 2010-05-29 NOTE — Assessment & Plan Note (Signed)
[place] OFFICE NOTE   NAME:Kayla [name], Kayla [name]                     MRN:          [id number]  DATE:06/13/2006                            DOB:          [birth year]    Kayla [name] comes in on May 30, says she needs her annual endo for Barrett's  esophagus, and she is having more dysphagia, having increased reflux,  right upper quadrant pain occasionally, as well.  She also noted some  odynophagia, some pain in the same place she had prior to her  cholecystectomy.  She also describes some problems with her  hypoglycemias.  She is very anxious because she says 30 family members  have died in the past several years, and the majority of these were  secondary to malignancies.   Ivalene is on a number of medications including clorazepan, Nexium 1 a  day, Effexor 75, Fioricet, Darvocet, Soma compound, Imitrex, DayPro,  __________, propranolol, Topamax, Relpax, Lidoderm patches, Nasacort,  Risperdal.   Kayla [name] has known IBS with a history of megacolon.  Has GERD and is status  post Roux-en-Y, cholecystectomy, has diabetes, short segment Barrett's,  has mild osteoarthritis, mitral valve prolapse, anxiety/depression,  especially anxiety.  It is obvious today.  Her last endoscopy was in  2007, revealed a 4-cm hiatal hernia with a short segment Barrett's, and  she was dilated with a #54 Maloney dilator.  Last colonoscopic  examination was in 2005 and was essentially normal.  She is scheduled  for a routine followup in 7 years which would be 2012.   PHYSICAL EXAMINATION:  Weight was 149, blood pressure 106/60, pulse 60  and regular.  Oropharynx was negative.  Neck was negative.  Heart revealed a regular rhythm, there was slightly decreased heart  sounds.  Lungs were clear to auscultation.  Supraclavicular area and thyroid were normal to palpation.  Inguinal  area was negative to palpation.  Abdomen was soft, no masses or  organomegaly, nontender except possibly  in the right upper quadrant epigastric area.   IMPRESSION:  As above plus abdominal pains.   RECOMMENDATIONS:  1. Get CT scan of the abdomen and pelvis, and a barium swallow with a      tablet to be complete.  2. I suspect this is somewhat of a motility disorder.  She does have a      significant hiatal hernia which is post Roux-en-Y, and she has this      abdominal pain which is difficult to define.  That      is why I recommended that she have a CT scan which has not been      done in quite some      time, and she is so anxious about cancer, most likely cancer phobia      because of family members.  I thought this would certainly be      indicated.     Clarene Reamer, MD  Electronically Signed    SML/MedQ  DD: 06/13/2006  DT: 06/13/2006  Job #: [id number]

## 2010-05-29 NOTE — Assessment & Plan Note (Signed)
[facility] OFFICE NOTE   NAME:Kayla [name], Kayla [name]                     MRN:          [id number]  DATE:02/04/2007                            DOB:          28-Mar-1945    REQUESTING PHYSICIAN:  Fenton Malling. Linna Darner, MD.  Note that the patient wrote  Dr. [name], who was her former physician, but Dr. Linna Darner referred  her.  Dr. [name] is no longer with [facility].   REASON FOR CONSULTATION:  Abdominal pain.   ASSESSMENT:  63. A 65 year old, white woman, with known irritable bowel syndrome and      chronic, recurrent abdominal pain some of which could be from      adhesions.  I think that that is likely what is going on.  She had      a computerized tomography scan last summer, she had a colonoscopy      in 2005.  I think she has a multifactorial, chronic abdominal pain      problem.  She is not tender on abdominal exam today.  2. She also has Barrett's esophagus and a previous gastrojejunostomy      procedure, which probably predisposes her to persistent bile      reflux.  Barrett's esophagus was biopsied in 2007 and showed no      dysplasia.   PLANS:  1. I do not think any endoscopic evaluation or other investigations      are needed right now.  2. A trial of Align daily.  3. MiraLax daily, she says this is too expensive and I will prescribe      it to see if that helps as she gets her medicines for free through      [facility].  4. Postgastrectomy syndrome diet handout was given to the patient.  5. She is reassured at this time.  6. Consider antibiotic therapy for possible small bowel bacterial      overgrowth.  7. Regarding her reflux, she complains of aspiration and persistent      reflux problems where she chokes and strangles on fluid and has to      sit up at night.  She says it burns quite a bit.  I do not think      she has had pneumonia from this.  This is a real problem it sounds  like, and I think if she moves her bowels somewhat better perhaps      she would have less of this, i.e., less pressure phenomenon.  We      will see how she responds to the daily MiraLax.  Repeat surgery for      her known hiatal hernia could have some effect here, though.  We      should try to use that as a last resort given her overall      constellation of problems.  8. Cancer phobia, she has had numerous relatives die of cancer, which      has spooked her into thinking she could have cancer.  She is      reassured about this today.  9. Repeat the EGD in 2010.  10.Repeat a colonoscopy perhaps in 2012.  She has a long, redundant      colon and a CT colonoscopy may be appropriate.  Propofol sedation      may be necessary as well.  Investigate signs or symptoms if there      is a real change sooner than that, but I really think she is having      an exacerbation of chronic problems.   Please see my medical history form for full details of her symptoms at  this time.   PAST MEDICAL HISTORY:  1. Barrett's esophagus.  2. Hiatal hernia.  3. Prior gastrojejunostomy procedure.  4. Gastroesophageal reflux disease.  5. Recent UTI, December 2008, treated.  6. Irritable bowel syndrome with chronic constipation.  7. Anxiety.  8. Hypertension.  9. Osteoarthritis.  10.Mitral valve prolapse.  11.Depression.  12.Prior cholecystectomy.  13.Note, her gastrojejunostomy is a Roux-En-Y procedure.  14.History of megacolon.  Dr. Velora Heckler thought she had a motility      disturbance.  15.Chronic reflux and aspiration problems, but I do not think that      this has led to pneumonia problems.  16.Fibromyalgia.  17.She does suffer from some hypoglycemia at times.  18.Prior tubal ligation.  19.Prior appendectomy.  20.Prior hysterectomy.  21.Cervical spine disease.  22.Lumbar spine disease.   FAMILY HISTORY:  Numerous relatives have died of ovarian cancers,  pancreatic cancer, breast cancer, lung  cancer, and this has left a  profound effect on her and scared her quite a bit.  No colon cancer  reported in first-degree relatives though apparently aunts and uncles  did.   SOCIAL HISTORY:  She is married, she is medically retired from  [employer], she has two adopted granddaughters she is  caring for, and she has her husband at home, and finances are tight.  No  alcohol, tobacco, or drugs.   REVIEW OF SYSTEMS:  See my medical history form for full details.   PHYSICAL EXAMINATION:  GENERAL:  An overtly anxious white woman, who  perseverates and talks in circles.  VITAL SIGNS:  Weight 158 pounds, pulse 82, blood pressure 94/62.  EYES:  Anicteric.  LUNGS:  Clear.  HEART:  S1 S2, no murmurs, rubs, or gallops, I do not hear a click  today.  ABDOMEN:  Soft and nontender without organomegaly or mass, there are  surgical scars present.  PSYCH:  She is alert and oriented x3, she is easy to reassure though she  is talkative and anxious.   I appreciate the opportunity to care for this patient.   Please see my handwritten worksheet in the office chart for full  details.     Gatha Mayer, MD,FACG  Electronically Signed    CEG/MedQ  DD: 02/04/2007  DT: 02/04/2007  Job #: [id number]   cc:   Posey Boyer, MD

## 2010-05-29 NOTE — Op Note (Signed)
Kayla [name], EID NO.:  [id number]   MEDICAL RECORD NO.:  [id number]          PATIENT TYPE:  INP   LOCATION:  5034                         FACILITY:  [facility]   PHYSICIAN:  Daryll Brod, M.D.       DATE OF BIRTH:  19-Jul-1945   DATE OF PROCEDURE:  01/06/2008  DATE OF DISCHARGE:                               OPERATIVE REPORT   PREOPERATIVE DIAGNOSIS:  Comminuted intra-articular fracture, right  distal radius.   POSTOPERATIVE DIAGNOSIS:  Comminuted intra-articular fracture, right  distal radius.   OPERATION:  Open reduction and internal fixation, right distal radius  fracture with distal volar radius plate, hand innovation, standard right  plate.   SURGEON:  Daryll Brod, MD   ASSISTANT:  Odessa Fleming, RN   ANESTHESIA:  Axillary general.   ANESTHESIOLOGIST:  Glynda Jaeger, MD   HISTORY:  The patient is a 65 year old female who suffered a fall,  suffering with comminuted intra-articular fracture of her right distal  radius.  She was seen in emergency room where this was splinted,  referred to Dr. [name], who has referred her for continued care.  X-  rays reveal a comminuted intra-articular fracture and displaced right  distal radius.  Plan is for open reduction and internal fixation.  She  is aware of risks and complications including infection, recurrence of  injury to arteries, nerves, tendons, incomplete relief of symptoms,  dystrophy, possibility of carpal tunnel syndrome, and possibility of  nonunion.  The patient is seen in the preoperative area, the extremity  was marked by both the patient and surgeon, the antibiotic given.   PROCEDURE:  The patient was brought to the operating room where an  axillary block was carried out without difficulty.  She was also given a  LMA under the direction of Dr. Linna Caprice, general anesthetic was then  instituted.  She was prepped using a DuraPrep, supine position, right  arm free.  A time-out was taken.  A  manipulation of the distal radius  was performed.  X-rays reveal reduction in both AP and lateral direction  with reconstitution of normal length, tilt, and inclination.  The limb  was exsanguinated with an Esmarch bandage.  Tourniquet placed high and  the arm was inflated to 250 mmHg, a volar radial incision was made for  application of distal volar radial plate.  This was carried down through  subcutaneous tissue.  The dissection carried through the flexor carpi  radialis tendon sheath.  The radial artery was identified and protected.  The FPL muscle and pronator quadratus were identified.  The pronator  quadratus was then incised on its radial aspect.  The periosteum was  then elevated along with the pronator quadratus and fracture was  immediately apparent.  The brachial radialis was then step-cut.  The  origin of the flexor pollicis longus was then elevated off from the  radius, this allowed visualization of the plate.  A standard right DVR  plate was then selected.  This was placed, positioned just proximal to  the watershed area on the distal radius and fixed with a single 12-mm  standard screw in the gliding hole after drilling with 2.5-mm drill bit,  this fixed it proximally.  The distal margin was then fixed distally  with a K-wire.  X-rays confirmed that this was position well, but  slightly proximal.  This was then relieved and pushed slightly distally.  Again, pinned x-rays revealed the plate and pin lied in good position.  The 4/5 K-wire was placed through the radial styloid pin tract fixing  the fracture in both AP and lateral direction.  The ulnar screws were  then placed, these measured 20 mm.  These were fully threaded to fix the  ulnar fragments of the distal radius.  This firmly fixed, these x-rays  are confirmed that these were not in the joint.  The remaining central  fixed angled components were placed.  These all measured approximately  20-22 mm and were placed as  non-threaded pegs.  The radial 2 screws were  placed.  These were fully threaded.  All were locking.  X-rays, AP,  lateral, and oblique confirmed that none were in the joint.  The  fracture was firmly fixed distally with restoration of length, tilt, and  inclination.  The remaining proximal fixation screws were placed into  the radial diaphysis.  These, each measured 14-12 mm.  This firmly fixed  the fracture in position, confirmed with OEC image intensification, the  wound was copiously irrigated with saline.  The pronator quadratus was  then repaired with figure-of-eight 4-0 Vicryl sutures as was the FPL.  The subcutaneous tissue was closed with interrupted 4-0 Vicryl and the  skin with interrupted 5-0 Vicryl Rapide.  Hemostasis was assured with  bipolar cauterization, the radial artery was protected throughout the  procedure.  A dorsal palmar splint was applied.  The patient tolerated  the procedure well.  On deflation of the tourniquet, all fingers  immediately pinked.  She was taken to the recovery room for observation  in satisfactory condition.  She will be admitted for overnight stay for  pain control and that she has a long history of pain problems.  She will  be discharged to home on Vicodin and her standard medicines.           ______________________________  Daryll Brod, M.D.     GK/MEDQ  D:  01/06/2008  T:  01/07/2008  Job:  [id number]   cc:   Linton Ham. [name], M.D.

## 2010-05-29 NOTE — Consult Note (Signed)
Kayla [name], Kayla [name] NO.:  [id number]   MEDICAL RECORD NO.:  [id number]          PATIENT TYPE:  INP   LOCATION:  5034                         FACILITY:  [facility]   PHYSICIAN:  Cherene Altes, M.D.DATE OF BIRTH:  Nov 18, 1945   DATE OF CONSULTATION:  DATE OF DISCHARGE:                                 CONSULTATION   REFERRING PHYSICIAN:  Dr. Fredna Dow.   PRIMARY CARE PHYSICIAN:  Robert P. [name], M.D.   CHIEF COMPLAINT:  Status post wrist fracture with corrective surgery  today in the setting of multiple chronic medical problems.   HISTORY OF PRESENT ILLNESS:  Ms. Kayla [name] is a pleasant 65-year-  old female with a multitude of chronic medical problems.  She underwent  a surgery correction for a distal radial and ulnar fracture today per  Dr. Fredna Dow.  I was contacted by Dr. Levell July office to assist with  perioperative medical management of her chronic medical problems during  her hospital stay.  At the time of my evaluation, the patient has been  transferred from the surgical center over to [facility] [address].  She  is resting comfortably in her bed with her right arm suspended and  dressed in her postoperative dressings.  At the present time, the  patient has no specific complaints.  She confirms that her fall on  January 03, 2008, which resulted in her fractures was actually a fall  and was not a presyncopal spell or a full episode of syncope.   REVIEW OF SYSTEMS:  At the present time, the patient has no specific  complaints.  She does relate multiple chronic medical problems which are  detailed in the past medical history below.   PAST MEDICAL HISTORY:  1. Mitral valve prolapse with only mild trace mitral valve regurg via      echocardiogram in January 2009, per Dr. Liam Rogers.  2. Hypertension.  3. Barrett's esophagus with severe gastroesophageal reflux disease.  4. Fibromyalgia.  5. History of cholelithiasis, status post cholecystectomy.  6.  Obesity, status post gastric bypass with a Roux-en-Y in 1999.  7. Mild diastolic dysfunction via echocardiogram in January 2009.  8. Normal nuclear medicine stress test in September 2008.  9. Migraine headaches - reported as debilitating.  10.Short and long-term memory deficits per patient's report.  11.Chronic constipation.  12.Impaired glucose tolerance with propensity for severe hypoglycemia.  13.Anxiety disorder.  14.Status post total abdominal hysterectomy/bilateral salpingo-      oophorectomy.  15.Status post tonsillectomy and adenoidectomy.  16.Status post appendectomy.  17.PTSD.  18.Osteoarthritis with osteoporosis.   OUTPATIENT MEDICATIONS:  1. Inderal p.r.n. chest pain.  2. Klonopin 1 mg b.i.d. p.r.n.  3. Fentanyl 50 mcg q.72 h.  4. Drisdol vitamin D q. week.  5. Estrace cream q. week directly to vaginal wall.  6. Fioricet 40 mg t.i.d. p.r.n.  7. Macrodantin nightly.  8. Fosamax 70 mg q. Sunday.  9. Neurontin 40 mg daily.  10.Imitrex 100 mg two times a week.  11.Neurontin 300 mg t.i.d.  12.Lidoderm patch 5% strength to be used as needed.  13.Nasacort 2 sprays in each  nostril p.r.n.   ALLERGIES:  1. CODEINE.  2. AMBIEN.   FAMILY HISTORY:  The patient's father had carotid disease and  hypertension.  The patient's mother had lung cancer.   SOCIAL HISTORY:  The patient does not smoke, she does not drink.  She  lives in the [place] area.  She is retired.   DATA REVIEW:  No labs are presently available.   PHYSICAL EXAMINATION:  VITAL SIGNS:  Temperature 97.5, blood pressure  128/78, heart rate 101, respiratory rate 20.  O2 sats 96% on room air.  GENERAL:  A well-developed, well-nourished female in no acute  respiratory distress.  LUNGS:  Clear to auscultation bilaterally with exception of very mild  bibasilar crackles without wheezes or rhonchi.  CARDIOVASCULAR:  Regular rate and rhythm without gallop or rub with  normal S1-S2.  ABDOMEN:  Soft.  Bowel sounds  hypoactive but present.  No organomegaly,  no rebound, no ascites.  EXTREMITIES:  Trace bilateral lower extremity edema, but no significant  cyanosis or clubbing.  NEUROLOGIC:  Alert and oriented x4.  Cranial nerves II through XII  intact bilaterally.  Intact sensation and touch throughout.   RECOMMENDATIONS:  1. Hypertension - the patient carries a diagnosis of hypertension, but      presently her blood pressure is well controlled.  She does not      appear to be on any standing antihypertensives in the outpatient      setting.  We will follow her blood pressure trend and titrate      medication as indicated.  2. Barrett's esophagus - we will continue proton pump inhibitor      therapy and follow the patient symptomatically.  3. Migraines - Fioricet will be ordered and continued on a t.i.d.      basis p.r.n.  4. Impaired glucose tolerance with hyperglycemia - we will check CBGs      on a q.a.c. basis.  We will avoid sliding scale insulin as the      patient reports that she develops severe hypoglycemia with even the      slightest medication administration in regard to hyperglycemia.      If, however, persistent hyperglycemia is noted, we will readdress      this issue.   Thank you very much for your consultation on this pleasant and complex  patient.  After my full review of records and evaluation of the patient,  I have discovered that her primary care physician is in fact Dr. Tami Lin.  The [facility] provides  ongoing inpatient care for patients of Dr. [name].  I have taken to  the liberty to contact the [facility], who will  come by to see the patient tonight on a social visit and for  introductory reasons and will assume ongoing consultative medical care  with the patient in the morning.      Cherene Altes, M.D.  Electronically Signed     JTM/MEDQ  D:  01/06/2008  T:  01/06/2008  Job:  [id number]    cc:   Linton Ham. [name], M.D.

## 2010-05-29 NOTE — Op Note (Signed)
Kayla [name], Kayla [name]              ACCOUNT NO.:  [id number]   MEDICAL RECORD NO.:  [id number]          PATIENT TYPE:  AMB   LOCATION:  DSC                          FACILITY:  [facility]   PHYSICIAN:  Monico Blitz. Dalldorf, M.D.DATE OF BIRTH:  [birth year]   DATE OF PROCEDURE:  03/10/2007  DATE OF DISCHARGE:                               OPERATIVE REPORT   PREOPERATIVE DIAGNOSES:  1. Right knee torn lateral meniscus.  2. Left knee chondromalacia of the patella.  3. Left knee chondromalacia.   POSTOPERATIVE DIAGNOSES:  1. Right knee torn medial and torn lateral meniscus.  2. Right knee chondromalacia of the patella.  3. Left knee chondromalacia.   PROCEDURES:  1. Right knee partial medial and partial lateral meniscectomies.  2. Right knee abrasion chondroplasty of patellofemoral.  3. Left knee intra-articular injection.   ANESTHESIA:  General.   ATTENDING SURGEON:  Monico Blitz. Rhona Raider, M.D.   INDICATIONS FOR PROCEDURE:  The patient is a 65 year old woman with many  months of right greater than left knee pain.  This has persisted despite  oral anti-inflammatories and activity restriction.  By MRI scan she has  things suspicious for meniscus injury.  She has pain which limits her  ability to rest and remain active, and she is offered an arthroscopy.  At her request we are also going to perform an intra-articular injection  of the opposite knee under anesthesia.   SUMMARY FINDINGS AND PROCEDURE:  Under general anesthesia, an  arthroscopy of the right knee was performed.  The suprapatellar pouch  was benign while the patellofemoral joint exhibited some focal  chondromalacia at the apex of the patella and the intertrochlear groove.  A thorough chondroplasty was done, with abrasion to bleeding bone in one  tiny location in the intertrochlear groove.  The medial compartment  showed a small posterior horn medial meniscus tear, addressed with about  a 5% partial medial meniscectomy.  There were  no significant  degenerative changes in this compartment.  ACL appeared intact.  Lateral  compartment did exhibit a radial tear of the middle horn, which required  about a 10% partial lateral meniscectomy (done with basket and shavers);  there were no degenerative changes here.  We then injected the left knee  with Depo-Medrol and lidocaine, and placed a Band-Aid.   DESCRIPTION OF PROCEDURE:  The patient went to the operating suite,  where general anesthetic was applied without difficulty.  She was  positioned supine, and prepped and draped in normal sterile fashion.  After the administration with  IV Kefzol, an arthroscopy of the right  knee was performed through a total of 2 portals.  Findings were as noted  above.  The procedure consisted of the partial medial and partial  lateral meniscectomies, followed by abrasion chondroplasty.  Adaptic was  applied, followed by dry gauze and a loose Ace wrap.  We then performed  an injection on the opposite knee with Depo-Medrol and lidocaine after  sterile prep.  A Band-Aid was applied post injection. Estimated blood  loss and fluids can be obtained from anesthesia records.   DISPOSITION:  The patient  was extubated in the operating room and taken  to the recovery room in stable addition.  She was good to go home the  same-day.  Will follow up in the office in one week.  I will contact her  by phone tonight.      Monico Blitz Rhona Raider, M.D.  Electronically Signed     PGD/MEDQ  D:  03/10/2007  T:  03/11/2007  Job:  [id number]

## 2010-07-26 ENCOUNTER — Other Ambulatory Visit: Payer: Self-pay | Admitting: Internal Medicine

## 2010-07-26 MED ORDER — POLYETHYLENE GLYCOL 3350 17 GM/SCOOP PO POWD: 17.0000 g | Freq: Every day | ORAL | Status: DC

## 2010-07-26 NOTE — Telephone Encounter (Signed)
Refilled medication

## 2010-08-22 ENCOUNTER — Telehealth: Payer: Self-pay | Admitting: Internal Medicine

## 2010-08-22 NOTE — Telephone Encounter (Signed)
Patient calling to report she is having constipation so bad that sometimes she may go 3 weeks without having a bowel movement. States she then takes Exlax and uses enemas and still has to use her fingers to "dig out the stool."  States her abdomen is distended, she has nausea, stomach cramps and her hemorrhoids get so bad the stool cannot pass. Patient states she is having surgery tomorrow at [facility]. She reports they are going to inject cortisone in her neck and shoulder. When questioned patient states she is taking pain medication for the neck pain. She states she is not using Miralax because it causes her to have gas and she does not want to have gas when having her surgery tomorrow. Explained to patient the need to get on a bowel regimen to help regulate her bowels since she is taking pain medication. She agreed to start stool softeners in AM and PM and Miralax daily after her surgery and Prep H for hemorrhoids.  She will also notify her pain management MD about these symptoms. She will call us back if these measures do not help.

## 2010-08-22 NOTE — Telephone Encounter (Signed)
Agree 

## 2010-09-04 ENCOUNTER — Encounter: Payer: Self-pay | Admitting: Cardiovascular Disease

## 2010-09-05 ENCOUNTER — Encounter: Payer: Self-pay | Admitting: Cardiovascular Disease

## 2010-09-05 ENCOUNTER — Ambulatory Visit: Payer: Medicare Other | Admitting: Nurse Practitioner

## 2010-10-03 ENCOUNTER — Telehealth: Payer: Self-pay | Admitting: Internal Medicine

## 2010-10-03 NOTE — Telephone Encounter (Signed)
Patient reports she is still having severe problems with constipation.  She reports weeks between BM.  She reports using enemas every few weeks to help with BM.  She is taking a stool softener BID and Miralax daily.  She is till taking narcotic pain meds due to shoulder and neck pain.  She is asked to increase her Miralax up to TID.  She will come in and see Tye Savoy RNP tomorrow at 1:30

## 2010-10-04 ENCOUNTER — Ambulatory Visit (INDEPENDENT_AMBULATORY_CARE_PROVIDER_SITE_OTHER): Payer: Medicare Other | Admitting: Nurse Practitioner

## 2010-10-04 ENCOUNTER — Encounter: Payer: Self-pay | Admitting: Nurse Practitioner

## 2010-10-04 DIAGNOSIS — K5909 Other constipation: Secondary | ICD-10-CM

## 2010-10-04 DIAGNOSIS — K5900 Constipation, unspecified: Secondary | ICD-10-CM

## 2010-10-04 DIAGNOSIS — F411 Generalized anxiety disorder: Secondary | ICD-10-CM

## 2010-10-04 DIAGNOSIS — K2270 Barrett's esophagus without dysplasia: Secondary | ICD-10-CM

## 2010-10-04 DIAGNOSIS — R634 Abnormal weight loss: Secondary | ICD-10-CM

## 2010-10-04 HISTORY — DX: Other constipation: K59.09

## 2010-10-04 NOTE — Progress Notes (Addendum)
Kayla [name] [id number] Jul 01, 1945   HISTORY OR PRESENT ILLNESS :  Patient is a 65 year old female known to Dr. Carlean Purl for history of Barrett's esophagus, chronic abdominal pain, family history of colon cancer. Patient comes in today for evaluation of constipation. She hasn't had a bowel movement in a month and feels impacted.  Yesterday we increased her Miralax over the telephone to three times a day. Patient is due for her repeat screening colonoscopy in 2015 but feels having one sooner would help her feel better. Her weight is down 9 more pounds since November 2011. No significant nausea, no dysphagia.   Current Medications, Allergies, Past Medical History, Past Surgical History, Family History and Social History were reviewed in [employer] record.  PHYSICAL EXAMINATION : General: Thin white female in no acute distress Head: Normocephalic and atraumatic Eyes:  sclerae anicteric,conjunctive pink. Ears: Normal auditory acuity Mouth: No deformity or lesions Neck: Supple, no masses.  Lungs: Clear throughout to auscultation Heart: Regular rate and rhythm; no murmurs heard Abdomen: Soft, nondistended, nontender. No masses or hepatomegaly noted. Normal bowel sounds Rectal: small amount of formed, heme negative stool in vault Musculoskeletal: Symmetrical with no gross deformities  Skin: No lesions on visible extremities Extremities: No edema or deformities noted Neurological: Alert oriented x 4, grossly nonfocal Cervical Nodes:  No significant cervical adenopathy Psychological:  Alert and cooperative. Normal mood and affect  ASSESSMENT AND PLAN :   Reviewed and agree with management.  Pt with constipation and family hx of colon cancer.  Pt up to date of CRC screening with normal colonoscopy in 2010. CT scan from 06/2009 showed possible rectal fecal impaction, which also fits with chronic constipation. No impaction by exam at this visit.  If unexplained wt loss  continues, could consider repeat imaging.

## 2010-10-04 NOTE — Patient Instructions (Signed)
Take 17 grams of Miralax  In 8 oz if water daily, do 3 times daily until you feel your bowels moving regularly.  Get yourself some dulcolax suppositories or  any Over the Counter brand and use 1 daily for now.  Drink 7-8 glasses of water daily. Call us back in a week with a progress report. [phone].  Make an appointment to see Dr. Carlean Purl in one month.

## 2010-10-08 LAB — POCT HEMOGLOBIN-HEMACUE: Hemoglobin: 13.2

## 2010-10-09 ENCOUNTER — Encounter: Payer: Self-pay | Admitting: Nurse Practitioner

## 2010-10-09 NOTE — Assessment & Plan Note (Signed)
Significant anxiety around possibility of ever being diagnosed with cancer. She has had several family members pass away with various cancers. She is up to date on colon cancer screening. Reassurance given.

## 2010-10-09 NOTE — Assessment & Plan Note (Addendum)
Formed but not hard stool in vault, no impaction. Continue Miralax three times a day until several good bowel movements then decrease to twice daily or once daily depending on need. May use Dulcolax suppositories as needed to facilitate bowel movements. If no improvement may consider Amitiza.

## 2010-10-09 NOTE — Assessment & Plan Note (Addendum)
Endoscopically appeared to have Barrett's on June 2010 EGD but biopsies negative. In 2004 and 2007 biopsies showed intestinal metaplasia so she is on for recall EGD in 2013. Continue daily PPI.

## 2010-10-09 NOTE — Assessment & Plan Note (Signed)
In Nov. 2011 she was 125 pounds, today 116. No significant nausea, dysphagia or other GI symptoms except constipation. Is this possibly related to anxiety / depression?

## 2010-10-19 LAB — GLUCOSE, CAPILLARY

## 2010-10-19 LAB — POCT HEMOGLOBIN-HEMACUE: Hemoglobin: 13 g/dL (ref 12.0–15.0)

## 2010-11-12 ENCOUNTER — Ambulatory Visit: Payer: Medicare Other | Admitting: Internal Medicine

## 2010-11-22 ENCOUNTER — Ambulatory Visit: Payer: Medicare Other | Admitting: Cardiovascular Disease

## 2010-11-22 ENCOUNTER — Other Ambulatory Visit: Payer: Self-pay | Admitting: Urology

## 2010-11-29 ENCOUNTER — Encounter (HOSPITAL_COMMUNITY): Payer: Self-pay | Admitting: *Deleted

## 2010-11-29 NOTE — Pre-Procedure Instructions (Signed)
Coming in for an EKG 12-04-10 at 11 am

## 2010-11-29 NOTE — Pre-Procedure Instructions (Signed)
Asked to take laxative,eat light dinner 12-09-10. No Aspirin,Advil or Toradol 72 hours prior to surgery

## 2010-12-04 ENCOUNTER — Other Ambulatory Visit: Payer: Self-pay

## 2010-12-04 ENCOUNTER — Ambulatory Visit (HOSPITAL_COMMUNITY)
Admission: RE | Admit: 2010-12-04 | Discharge: 2010-12-04 | Disposition: A | Discharge status: Home / Self Care | Payer: Medicare Other | Source: Ambulatory Visit | Attending: Urology | Admitting: Urology

## 2010-12-04 DIAGNOSIS — Z01810 Encounter for preprocedural cardiovascular examination: Secondary | ICD-10-CM | POA: Insufficient documentation

## 2010-12-04 DIAGNOSIS — N200 Calculus of kidney: Secondary | ICD-10-CM | POA: Insufficient documentation

## 2010-12-10 ENCOUNTER — Encounter (HOSPITAL_COMMUNITY): Admission: RE | Payer: Self-pay | Source: Ambulatory Visit

## 2010-12-10 ENCOUNTER — Ambulatory Visit (HOSPITAL_COMMUNITY): Admission: RE | Admit: 2010-12-10 | Payer: Medicare Other | Source: Ambulatory Visit | Admitting: Urology

## 2010-12-10 SURGERY — LITHOTRIPSY, ESWL
Anesthesia: Monitor Anesthesia Care

## 2010-12-11 ENCOUNTER — Other Ambulatory Visit: Payer: Self-pay | Admitting: Urology

## 2010-12-12 ENCOUNTER — Other Ambulatory Visit: Payer: Self-pay | Admitting: Urology

## 2010-12-20 ENCOUNTER — Encounter: Payer: Self-pay | Admitting: Cardiovascular Disease

## 2010-12-20 ENCOUNTER — Ambulatory Visit (INDEPENDENT_AMBULATORY_CARE_PROVIDER_SITE_OTHER): Payer: Medicare Other | Admitting: Cardiovascular Disease

## 2010-12-20 DIAGNOSIS — R079 Chest pain, unspecified: Secondary | ICD-10-CM

## 2010-12-20 NOTE — Patient Instructions (Addendum)
I recommend Lucita Ferrara as a Teacher, early years/pre.  [phone].   Your physician wants you to follow-up in: 6 months  You will receive a reminder letter in the mail two months in advance. If you don't receive a letter, please call our office to schedule the follow-up appointment.

## 2010-12-20 NOTE — Progress Notes (Signed)
[name] Date of Birth  Oct 07, 1945 [facility] [address]    [place], [place]  [address] [phone]  Fax  [phone]  History of Present Illness:  Kayla [name] is a 65 year old female with a history of mitral valve prolapse syndrome. She has a history of chest pains in the past. She's had a negative cardiac catheterization and has had several negative stress test.  She presents today for further worsening of these episodes of chest pain.  These episodes of CP occur with stressful situations and not with exercise.    Current Outpatient Prescriptions on File Prior to Visit  Medication Sig Dispense Refill  . buPROPion (WELLBUTRIN) 75 MG tablet Take 75 mg by mouth daily.        . butalbital-acetaminophen-caffeine (FIORICET, ESGIC) 50-325-40 MG per tablet Take 1 tablet by mouth 3 (three) times daily.        . Cholecalciferol (VITAMIN D) 2000 UNITS tablet Take 2,000 Units by mouth daily.        . clonazePAM (KLONOPIN) 1 MG tablet Take 1 mg by mouth 3 (three) times daily.        Marland Kitchen esomeprazole (NEXIUM) 40 MG capsule Take 40 mg by mouth daily before breakfast.        . fentaNYL (DURAGESIC - DOSED MCG/HR) 75 MCG/HR Place 1 patch onto the skin every 3 (three) days. Taking 25mg . Due to change wednesday      . HYDROcodone-acetaminophen (VICODIN) 5-500 MG per tablet Take 1 tablet by mouth every 6 (six) hours as needed.       . lidocaine (LIDODERM) 5 % Place 1 patch onto the skin daily. Remove & Discard patch within 12 hours or as directed by MD       . propranolol (INDERAL) 20 MG tablet Take 20 mg by mouth as needed. For angina       . SUMAtriptan (IMITREX) 100 MG tablet Take 100 mg by mouth every 3 (three) months.       . traZODone (DESYREL) 100 MG tablet Take 100 mg by mouth at bedtime.          Allergies  Allergen Reactions  . Codeine Anaphylaxis  . Oxycodone-Acetaminophen Shortness Of Breath  . Food Swelling    Peanuts-wheat-  . Tylenol-Codeine Other (See  Comments)    Shaky-chest feels heavy  . Zolpidem Tartrate Other (See Comments)    Makes her go crazy    Past Medical History  Diagnosis Date  . Hypertension   . MVP (mitral valve prolapse)   . Barrett esophagus   . Fibromyalgia   . History of kidney stones   . DDD (degenerative disc disease)     cervical and lumbar  . Depression   . Osteoarthritis   . Anxiety   . IBS (irritable bowel syndrome)   . GERD (gastroesophageal reflux disease)   . Hiatal hernia     Past Surgical History  Procedure Date  . Cardiac catheterization 03/22/1991    EF 61%  . US echocardiography 01/21/2007    EF 55-60%  . US echocardiography 08/30/2003    EF 55-60%  . Cardiovascular stress test 10/03/2006  . Esophagus surgery   . Gastric bypass 1999  . Appendectomy   . Cholecystectomy   . Abdominal hysterectomy   . Gastric restriction surgery 1991  . Tubal ligation   . Right knee arthroscopy   . Right arm surgery   . Upper gastrointestinal endoscopy 06/23/2008    Barrett's esophagus  .  Colonoscopy 06/23/2008    normal  . Tonsillectomy     History  Smoking status  . Never Smoker   Smokeless tobacco  . Never Used    History  Alcohol Use No    Family History  Problem Relation Age of Onset  . Stroke Mother   . Hypertension Father   . Stroke Father   . Pancreatic cancer Sister   . Lung cancer Sister   . Lung cancer Father   . Lung cancer Mother   . Colon cancer Maternal Aunt     12 relatives  . Uterine cancer      aunt  . Clotting disorder Sister   . Heart disease      grandmother  . Heart disease Father   . Heart disease Mother   . Heart disease Sister     x 2  . Kidney disease Sister     x 2  . Kidney disease Father   . Breast cancer Sister   . Diabetes Mother   . Seizures Father     epilepsy, and sisters x 2    Reviw of Systems:  Reviewed in the HPI.  All other systems are negative.  Physical Exam: BP 100/80  Pulse 88  Ht 5\' 5"  (1.651 m)  Wt 113 lb 12.8 oz (51.619  kg)  BMI 18.94 kg/m2 The patient is alert and oriented x 3.  The mood and affect are normal.   Skin: warm and dry.  Color is normal.    HEENT:   Normocephalic/atraumatic.  Neck is supple. Her mucous membranes are moist.  Lungs: Lungs are clear to auscultation.   Heart: RR, soft systolic murmur    Abdomen: Abdominal exam is soft. She has good bowel sounds.  Extremities:  No clubbing cyanosis or edema.  Neuro:  There exam is nonfocal. Her gait is normal.    ECG:   Assessment / Plan:

## 2010-12-21 ENCOUNTER — Encounter: Payer: Self-pay | Admitting: Cardiovascular Disease

## 2010-12-21 NOTE — Assessment & Plan Note (Signed)
The patient presents with some episodes of chest pain. These typically occur when she's under a lot of stress dealing with her family. I don't think that any of these are due to coronary artery disease. She's had a negative heart catheterization in the past and has had several negative stress test.  I've given her the name of a counselor Lucita Ferrara) he is to talk to about dealing with her adopted teenage grandson who is causing her a lot of problems.  LC her again in several months.

## 2011-01-16 ENCOUNTER — Encounter (HOSPITAL_COMMUNITY): Payer: Self-pay | Admitting: Pharmacy Technician

## 2011-01-17 ENCOUNTER — Telehealth: Payer: Self-pay | Admitting: Internal Medicine

## 2011-01-17 ENCOUNTER — Ambulatory Visit: Payer: Medicare Other | Admitting: Internal Medicine

## 2011-01-17 ENCOUNTER — Encounter (HOSPITAL_COMMUNITY): Payer: Self-pay | Admitting: *Deleted

## 2011-01-17 NOTE — Telephone Encounter (Signed)
No Charge per Dr. Carlean Purl.

## 2011-01-17 NOTE — Progress Notes (Signed)
Instructed no aspirin products 3 days prior to procedure and to take laxative 01/23/11 pm. She will call Dr, Junious Silk office re when and if to remove  Fentanyl patch

## 2011-01-22 ENCOUNTER — Ambulatory Visit (INDEPENDENT_AMBULATORY_CARE_PROVIDER_SITE_OTHER): Payer: Medicare Other | Admitting: Internal Medicine

## 2011-01-22 ENCOUNTER — Encounter: Payer: Self-pay | Admitting: Internal Medicine

## 2011-01-22 VITALS — BP 110/64 | HR 80 | Ht 65.0 in | Wt 109.0 lb

## 2011-01-22 DIAGNOSIS — R634 Abnormal weight loss: Secondary | ICD-10-CM | POA: Diagnosis not present

## 2011-01-22 DIAGNOSIS — K5909 Other constipation: Secondary | ICD-10-CM

## 2011-01-22 DIAGNOSIS — R0789 Other chest pain: Secondary | ICD-10-CM | POA: Diagnosis not present

## 2011-01-22 DIAGNOSIS — K2270 Barrett's esophagus without dysplasia: Secondary | ICD-10-CM | POA: Diagnosis not present

## 2011-01-22 DIAGNOSIS — R109 Unspecified abdominal pain: Secondary | ICD-10-CM

## 2011-01-22 DIAGNOSIS — R1319 Other dysphagia: Secondary | ICD-10-CM | POA: Diagnosis not present

## 2011-01-22 DIAGNOSIS — K5900 Constipation, unspecified: Secondary | ICD-10-CM

## 2011-01-22 DIAGNOSIS — R1030 Lower abdominal pain, unspecified: Secondary | ICD-10-CM

## 2011-01-22 DIAGNOSIS — Z9283 Personal history of failed moderate sedation: Secondary | ICD-10-CM

## 2011-01-22 NOTE — Progress Notes (Signed)
Subjective:    Patient ID: Kayla [name], female    DOB: May 25, 1945, 66 y.o.   MRN: [id number]  HPI this 66 year old white woman is known to me from previous evaluations. She has findings of Barrett's esophagus status post Roux-en-Y gastric surgery for obesity. She's had chronic recurrent abdominal symptoms. She saw the nurse practitioner 66  months ago with chronic constipation and weight loss. MiraLax every 3 days to provide a good regular bowel movements at this time. She continues to lose weight. She is due for lithotripsy of a very large right renal stone, and she has had recurrent urinary tract infections. She is trying to eat a special 10 but she regurgitates and spits up and she is frequently nauseous after she eats. She has intermittent chest pain, often at night. She saw cardiology who felt this was not cardiac. There may be some dysphagia at times. She is having recurrent lower quadrant abdominal pain as well. Bilateral lower quadrants. Episodic and sporadic she's also had some anal or rectal pain at times. She's not had any recent rectal bleeding. She manually disimpacted [facility] and some blood earlier in the year but none since then.  Social situation and history is notable for significant strife with her daughter and himself as well as her husband and she says that's much better now. She also says that she's had for cousins Diane last few months and a half brother is dying.  Allergies  Allergen Reactions  . Codeine Anaphylaxis  . Oxycodone-Acetaminophen Shortness Of Breath  . Food Swelling    Peanuts-wheat-  . Tylenol-Codeine Other (See Comments)    Shaky-chest feels heavy  . Zolpidem Tartrate Other (See Comments)    Makes her go crazy   Outpatient Prescriptions Prior to Visit  Medication Sig Dispense Refill  . buPROPion (WELLBUTRIN) 75 MG tablet Take 75 mg by mouth daily after breakfast.       . butalbital-acetaminophen-caffeine (FIORICET, ESGIC) 50-325-40 MG per tablet  Take 1 tablet by mouth every 6 (six) hours as needed. Headache       . clonazePAM (KLONOPIN) 1 MG tablet Take 0.5-1 mg by mouth 2 (two) times daily as needed.       Marland Kitchen erythromycin ophthalmic ointment Place 0.5 application into both eyes at bedtime.        Marland Kitchen esomeprazole (NEXIUM) 40 MG capsule Take 40 mg by mouth daily before breakfast.        . fentaNYL (DURAGESIC - DOSED MCG/HR) 25 MCG/HR Place 1 patch onto the skin every 3 (three) days.        Marland Kitchen HYDROcodone-acetaminophen (NORCO) 7.5-325 MG per tablet Take 1 tablet by mouth every 6 (six) hours as needed. Pain        . lidocaine (LIDODERM) 5 % Place 1 patch onto the skin as needed. Remove & Discard patch within 12 hours or as directed by MD. Pain       . polyethylene glycol powder (MIRALAX) powder Take 17 g by mouth every other day.       . propranolol (INDERAL) 20 MG tablet Take 20 mg by mouth as needed. For angina       . SUMAtriptan (IMITREX) 100 MG tablet Take 100 mg by mouth every 3 (three) days as needed. Headache       . traZODone (DESYREL) 100 MG tablet Take 50 mg by mouth at bedtime.       . triamcinolone (NASACORT) 55 MCG/ACT nasal inhaler Place 2 sprays into the nose daily as needed.  Allergies        Past Medical History  Diagnosis Date  . MVP (mitral valve prolapse)   . Barrett esophagus   . History of kidney stones   . Anxiety   . IBS (irritable bowel syndrome)   . Complication of anesthesia     either  BP or pulse dropped with last surgery  . Angina     takes Propanolol prn and it stops her chest  . Chronic kidney disease     kidney stones and UTI  . Cancer 1999    pre cancer in esophagus gone now  . Depression     post traumatic stress  disorder  . Hypertension     3 small brain aneurysms  . GERD (gastroesophageal reflux disease)   . DDD (degenerative disc disease)     cervical and lumbar  . Osteoarthritis     all over  . Headache     migraines  . Dysrhythmia     brought on by stress  . Peripheral vascular  disease     superficial phlebitis in left calf  . Diabetes mellitus     sugar goes up and down diet controlled  . Anemia     anemia in the past  . Fibromyalgia     neuropathy knees ankles and toes  . Hiatal hernia    Past Surgical History  Procedure Date  . US echocardiography 01/21/2007    EF 55-60%  . US echocardiography 08/30/2003    EF 55-60%  . Cardiovascular stress test 10/03/2006  . Esophagus surgery   . Gastric bypass 1999  . Appendectomy   . Cholecystectomy   . Abdominal hysterectomy   . Gastric restriction surgery 1991  . Tubal ligation   . Right knee arthroscopy   . Right arm surgery   . Upper gastrointestinal endoscopy 06/23/2008    w/Dil, Barrett's esophagus  . Colonoscopy 06/23/2008    normal  . Tonsillectomy   . Cardiac catheterization 03/22/1991    EF 61%  . Stomach stappeling 1991  . Dilation and curettage of uterus     x2  . Exploratory laparotomy 1993  . Stone extraction with basket 2012  . Rt wrist fx 2009   History   Social History  . Marital Status: Married    Spouse Name: N/A    Number of Children: 2  . Years of Education: N/A   Occupational History  . Retired    Social History Main Topics  . Smoking status: Never Smoker   . Smokeless tobacco: Never Used  . Alcohol Use: No  . Drug Use: No  . Sexually Active: None   Other Topics Concern  . None   Social History Narrative  . None   Family History  Problem Relation Age of Onset  . Stroke Mother   . Hypertension Father   . Stroke Father   . Pancreatic cancer Sister   . Lung cancer Sister   . Lung cancer Father   . Lung cancer Mother   . Colon cancer Maternal Aunt     12 relatives  . Uterine cancer      aunt  . Clotting disorder Sister   . Heart disease      grandmother  . Heart disease Father   . Heart disease Mother   . Heart disease Sister     x 2  . Kidney disease Sister     x 2  . Kidney disease Father   . Breast  cancer Sister   . Diabetes Mother   . Seizures  Father     epilepsy, and sisters x 2          Review of Systems As above    Objective:   Physical Exam General:  NAD - thin somewhat gaunt Eyes:   anicteric Lungs:  clear Heart:  S1S2 no rubs, murmurs or gallops Abdomen:  soft, BS+, mildly tender in the lower quadrants. There is loose skin. There is no hepatosplenomegaly. The ribs    are not tender. Ext:   no edema    Data Reviewed: CT scan from urology shows post cholecystectomy biliary dilation, a 1.2 x 6 cm right renal pelvis stone, granulomas in the spleen   Wt Readings from Last 3 Encounters:  01/22/11 109 lb (49.442 kg)  12/20/10 113 lb 12.8 oz (51.619 kg)  10/04/10 116 lb (52.617 kg)         Assessment & Plan:   1. Loss of weight   2. Chest pain, atypical   3. Other dysphagia   4. Barrett's esophagus   5. Personal history of failed moderate sedation   6. Constipation, chronic   7. Lower abdominal pain     She has multiple chronic recurrent symptoms. These are not all new. The weight loss is overly multifactorial. She has had significant family strife and anxiety. She is not eating well. She is regurgitating. I suspect most of her problems are functional.  Given the overall history, I think an EGD with possible esophageal dilation as appropriate. She has a personal history of failed moderate sedation so propofol will be used for her procedure. I did explain the risks benefits and indications she understands and agrees to proceed. Followup on her Barrett's esophagus at that time as well.  She was asking about a colonoscopy. I don't think that which show [place] anything, and I would prefer to wait until her lithotripsy is accomplished before we pursue that. He responded nicely to MiraLax, she had a colonoscopy in the past few years I think it's unlikely would learn anything new. This lady clearly has health-related anxiety issues and is frequently concerned about possible problems that are unlikely. Note that when  she was sent for genetic counselor in the past, we could not completely verify her family history of multiple cancers. I'm not convinced this is accurate.

## 2011-01-22 NOTE — Patient Instructions (Signed)
You have been scheduled for an Endoscopy with separate instructions given.

## 2011-01-23 ENCOUNTER — Encounter: Payer: Self-pay | Admitting: Cardiovascular Disease

## 2011-01-23 NOTE — H&P (Addendum)
History of Present Illness                          Today she is well. She continues to have frequency q 1-2 hrs, severe urgency and incontinence. She wears a pad every day which is not wet, but about 3 days a week she will wet her pants. She also leaks with cough sneeze and strain. Nocturia x 5-6. She has had a Hx and NSVD x 5. She had urethra "dilated" in past. No dysuria or hematuria. She has no flank pain. The patient states she was diagnosed with a rectocele and a cystocele in the past. She has fenatnyl patch.   She is s/p cystoscopy with ureteroscopy and ureteral stone extraction of a 74mm stone 07/2009 -- cystoscopy reports no tumors in bladder. Pt also had cystoscopy in 2009 which was negative.  She underwent CT Hematuria Nov 2012 - I reviewed all images. This shows a 1.2 cm right UPJ stone with no hydronephrosis. There are two other calcification near the  right ureter. Delayed images confirms these are outside the ureter (this was also cofirmed on right URS/RGP note July 2011).    Past Medical History Problems  1. History of  Anxiety (Symptom) 300.00 2. History of  Arthritis V13.4 3. History of  Barrett's Esophagus 530.85 4. History of  Cerebral Artery Aneurysm 437.3 5. History of  Fibromyalgia 729.1 6. History of  Heartburn 787.1 7. History of  Migraine Headache 346.90 8. History of  Mitral Valve Disorder 424.0 9. History of  Osteoporosis 733.00 10. History of  Peptic Ulcer V12.71 11. History of  Post-traumatic Stress Disorder 309.81 12. History of  Rhythm Disorder 427.9  Surgical History Problems  1. History of  Appendectomy 2. History of  Cystoscopy With Biopsy 3. History of  Cystoscopy With Dilation Of Bladder 4. History of  Cystoscopy With Insertion Of Ureteral Stent Left 5. History of  Cystoscopy With Ureteroscopy With Removal Of Calculus 6. History of  Gallbladder Surgery 7. History of  Hysterectomy V45.77 8. History of  Tonsillectomy 9. History of  Tubal Ligation  V25.2  Current Meds 1. ClonazePAM 1 MG Oral Tablet; Therapy: (Recorded:19Dec2008) to 2. FentaNYL 25 MCG/HR Transdermal Patch 72 Hour; APPLY 1 PATCH EVER 3 DAYS; Therapy:  20Nov2012 to 3. Fioricet TABS; Therapy: (Recorded:19Dec2008) to 4. Flaxseed Oil 1000 MG Oral Capsule; Therapy: (Recorded:15Oct2012) to 5. Hydrocodone-Acetaminophen 7.5-325 MG Oral Tablet; Therapy: (Recorded:27Dec2012) to 6. Imitrex 100 MG Oral Tablet; Therapy: (Recorded:15Oct2012) to 7. Lidoderm PTCH; Therapy: (Recorded:06Aug2009) to 8. MiraLax Oral Powder; Therapy: (Recorded:15Oct2012) to 9. Nasonex 50 MCG/ACT Nasal Suspension; Therapy: (Recorded:06Aug2009) to 10. Neurontin 300 MG Oral Capsule; Therapy: (Recorded:19Dec2008) to 11. NexIUM 40 MG Oral Capsule Delayed Release; Therapy: (Recorded:19Dec2008) to 12. Nitrofurantoin Macrocrystal 50 MG Oral Capsule; TAKE  ONE CAPSULE BY MOUTH   NIGHTLY AT   BEDTIME; Therapy: [id number] to (Last Rx:28Nov2012)  Requested for: [id number] 13. Phenazopyridine HCl 200 MG Oral Tablet; Take one tab bid to tid prn burning on urination;   Therapy: 06Sep2011 to (Last Rx:06Sep2011) 14. Propranolol HCl 20 MG Oral Tablet; Therapy: (Recorded:15Oct2012) to 15. Robaxin 500 MG Oral Tablet; Therapy: (Recorded:29Apr2010) to 57. Sertraline HCl 50 MG Oral Tablet; TAKE 1 TABLET BY MOUTH DAILY; Therapy: 07Sep2012 to 17. TraZODone HCl 50 MG Oral Tablet; Therapy: (Recorded:15Oct2012) to 18. Valerian Root 450 MG Oral Capsule; Therapy: (Recorded:15Oct2012) to 19. Vitamin B-6 100 MG Oral Tablet; Therapy: (Recorded:15Oct2012) to 20. Vitamin D3 2000 UNIT Oral Tablet; Therapy: (  Recorded:15Oct2012) to  Allergies Medication  1. Ambien TABS 2. Codeine Derivatives 3. Lorcet Plus TABS 4. Percocet TABS 5. Tylenol TABS 6. Tylox CAPS  Family History Problems  1. Paternal history of  Death In The Family Father age 13; kidneys 2. Maternal history of  Death In The Family Mother age 71; lung cancer 3. Family  history of  Family Health Status Number Of Children 1 son; 1 daughter  Social History Problems  1. Caffeine Use 1 a day 2. Marital History - Currently Married 3. Never A Smoker 4. Occupation: retired Producer, television/film/video  5. Alcohol Use 6. Tobacco Use  Review of Systems Genitourinary, constitutional, skin, eye, otolaryngeal, hematologic/lymphatic, cardiovascular, pulmonary, endocrine, musculoskeletal, gastrointestinal, neurological and psychiatric system(s) were reviewed and pertinent findings if present are noted.  Musculoskeletal: back pain, joint pain and limb pain.    Vitals Vital Signs [Data Includes: Last 1 Day]  27Dec2012 10:28AM  Blood Pressure: 104 / 63 Temperature: 98.2 F Heart Rate: 82  Physical Exam Constitutional: Well nourished and well developed . No acute distress.  Pulmonary: No respiratory distress and normal respiratory rhythm and effort.  Cardiovascular: Heart rate and rhythm are normal . No peripheral edema.  Neuro/Psych:. Mood and affect are appropriate.    Results/Data Urine [Data Includes: Last 1 Day]   27Dec2012  COLOR YELLOW   APPEARANCE CLEAR   SPECIFIC GRAVITY 1.025   pH 5.0   GLUCOSE NEG mg/dL  BILIRUBIN NEG   KETONE NEG mg/dL  BLOOD TRACE   PROTEIN NEG mg/dL  UROBILINOGEN 0.2 mg/dL  NITRITE NEG   LEUKOCYTE ESTERASE NEG   SQUAMOUS EPITHELIAL/HPF RARE   WBC NONE SEEN WBC/hpf  RBC 0-3 RBC/hpf  BACTERIA NONE SEEN   CRYSTALS NONE SEEN   CASTS NONE SEEN    Assessment Assessed  1. Nephrolithiasis 592.0 2. Urge And Stress Incontinence 788.33   Increasing right UPJ stone. Mixed incontinence   Plan Health Maintenance (V70.0)  1. UA With REFLEX  Done: [id number] 10:08AM Nephrolithiasis (592.0)  2. Follow-up Schedule Surgery Office  Follow-up  Requested for: 27Dec2012   Right ESWL Cysto and UDS once stone clear   Discussion/Summary  Discussed plan with patient. We discussed nature, R/B of ESWL including alternatives such as continued  surveillance or URS/laser/stent. All questions answered. She elects to proceed with ESWL. We discussed risks of bleeding (requiring transfusion or embolization), renal and ureteral trauma (and long term consequences), infection/severe infection, failure to fragment stone, failure to pass fragments and obstruction, need for multiple procedures/staged approach/possible stent among others. I discussed with the patient the likelihood of achieving the goals of the procedure and potential problems that might occur during the procedure or recuperation.  H&P update - pt seen and examined. No change in H&P.

## 2011-01-24 ENCOUNTER — Ambulatory Visit (HOSPITAL_COMMUNITY)
Admission: RE | Admit: 2011-01-24 | Discharge: 2011-01-24 | Disposition: A | Discharge status: Home / Self Care | Payer: Medicare Other | Source: Ambulatory Visit | Attending: Urology | Admitting: Urology

## 2011-01-24 ENCOUNTER — Encounter (HOSPITAL_COMMUNITY): Payer: Self-pay | Admitting: Anesthesiology

## 2011-01-24 ENCOUNTER — Ambulatory Visit (HOSPITAL_COMMUNITY): Payer: Medicare Other

## 2011-01-24 ENCOUNTER — Encounter (HOSPITAL_COMMUNITY)
Admission: RE | Disposition: A | Discharge status: Home / Self Care | Payer: Self-pay | Source: Ambulatory Visit | Attending: Urology

## 2011-01-24 ENCOUNTER — Ambulatory Visit (HOSPITAL_COMMUNITY): Payer: Medicare Other | Admitting: Anesthesiology

## 2011-01-24 DIAGNOSIS — N200 Calculus of kidney: Secondary | ICD-10-CM | POA: Insufficient documentation

## 2011-01-24 DIAGNOSIS — N189 Chronic kidney disease, unspecified: Secondary | ICD-10-CM | POA: Diagnosis not present

## 2011-01-24 DIAGNOSIS — K219 Gastro-esophageal reflux disease without esophagitis: Secondary | ICD-10-CM | POA: Diagnosis not present

## 2011-01-24 DIAGNOSIS — F4310 Post-traumatic stress disorder, unspecified: Secondary | ICD-10-CM | POA: Insufficient documentation

## 2011-01-24 DIAGNOSIS — IMO0001 Reserved for inherently not codable concepts without codable children: Secondary | ICD-10-CM | POA: Insufficient documentation

## 2011-01-24 DIAGNOSIS — I798 Other disorders of arteries, arterioles and capillaries in diseases classified elsewhere: Secondary | ICD-10-CM | POA: Diagnosis not present

## 2011-01-24 DIAGNOSIS — N135 Crossing vessel and stricture of ureter without hydronephrosis: Secondary | ICD-10-CM | POA: Diagnosis not present

## 2011-01-24 HISTORY — DX: Chronic kidney disease, unspecified: N18.9

## 2011-01-24 HISTORY — DX: Peripheral vascular disease, unspecified: I73.9

## 2011-01-24 HISTORY — DX: Other complications of anesthesia, initial encounter: T88.59XA

## 2011-01-24 HISTORY — DX: Angina pectoris, unspecified: I20.9

## 2011-01-24 HISTORY — DX: Adverse effect of unspecified anesthetic, initial encounter: T41.45XA

## 2011-01-24 HISTORY — DX: Headache: R51

## 2011-01-24 HISTORY — DX: Cardiac arrhythmia, unspecified: I49.9

## 2011-01-24 HISTORY — DX: Anemia, unspecified: D64.9

## 2011-01-24 SURGERY — LITHOTRIPSY, ESWL
Anesthesia: Monitor Anesthesia Care | Laterality: Right

## 2011-01-24 MED ORDER — CIPROFLOXACIN HCL 500 MG PO TABS
500.0000 mg | ORAL_TABLET | ORAL | Status: AC
  Administered 2011-01-24: 500 mg via ORAL

## 2011-01-24 MED ORDER — DEXTROSE-NACL 5-0.45 % IV SOLN: INTRAVENOUS | Status: DC

## 2011-01-24 MED ORDER — LACTATED RINGERS IV SOLN
INTRAVENOUS | Status: DC
  Administered 2011-01-24: 07:00:00 via INTRAVENOUS

## 2011-01-24 MED ORDER — CIPROFLOXACIN HCL 250 MG PO TABS: 250.0000 mg | ORAL_TABLET | Freq: Two times a day (BID) | ORAL | Status: AC

## 2011-01-24 MED ORDER — HYDROCODONE-ACETAMINOPHEN 7.5-500 MG PO TABS: 1.0000 | ORAL_TABLET | Freq: Four times a day (QID) | ORAL | Status: AC | PRN

## 2011-01-24 NOTE — Preoperative (Signed)
Beta Blockers   Reason not to administer Beta Blockers:Not Applicable 

## 2011-01-24 NOTE — Brief Op Note (Signed)
01/24/2011  8:10 AM  PATIENT:  Kayla [name]  66 y.o. female  PRE-OPERATIVE DIAGNOSIS:  Right Ureteral Pelvic Junction Stone  POST-OPERATIVE DIAGNOSIS: Right Nephrolithiasis  PROCEDURE:  Procedure(s): EXTRACORPOREAL SHOCK WAVE LITHOTRIPSY (ESWL) RIGHT  SURGEON:  Surgeon(s): Fredricka Bonine, MD   ANESTHESIA:   IV sedation  EBL:   0  BLOOD ADMINISTERED:none  DRAINS: none   LOCAL MEDICATIONS USED:  NONE  SPECIMEN:  No Specimen  DISPOSITION OF SPECIMEN:  N/A  PLAN OF CARE: Discharge to home after PACU  PATIENT DISPOSITION:  PACU - hemodynamically stable.  Dictation: Please see scanned ESWL OP NOTE

## 2011-01-24 NOTE — Progress Notes (Signed)
[facility] CRNA at pts side for pre litho assessment.  [facility] CRNA is aware that pt has fentenyl patch on rt lower abd and that she took vicodin and fiorcet at 04:30 this am.    Eulas Post CRNA is aware pt is not receiving benadryl and valium pre-op d/t her his given her MAC anesthesia for lithotripsy.

## 2011-01-24 NOTE — Progress Notes (Signed)
Pt. Arrived from [place] truck A& ) x 3. Vital signs stable. See doc flowsheets for documentation.  Rosario Adie, CRNA, returned with pt. Kayla [name] confirmed all information about pt.

## 2011-01-24 NOTE — Progress Notes (Signed)
Pt states she took two pink ex-lax tablets with no results.  Pt denies taking aspirin, ibuprofen or toradol products in the last 3 days.

## 2011-01-24 NOTE — Anesthesia Preprocedure Evaluation (Deleted)
Anesthesia Evaluation  Patient identified by MRN, date of birth, ID band Patient awake    Reviewed: Allergy & Precautions, H&P , NPO status , Patient's Chart, lab work & pertinent test results  Airway Mallampati: II TM Distance: >3 FB Neck ROM: Full    Dental No notable dental hx.    Pulmonary neg pulmonary ROS,  clear to auscultation  Pulmonary exam normal       Cardiovascular hypertension, + angina neg cardio ROS + dysrhythmias Regular Normal    Neuro/Psych  Headaches, PSYCHIATRIC DISORDERS Anxiety Depression  Neuromuscular disease Negative Neurological ROS  Negative Psych ROS   GI/Hepatic negative GI ROS, Neg liver ROS, hiatal hernia, GERD-  ,  Endo/Other  Negative Endocrine ROSDiabetes mellitus-  Renal/GU negative Renal ROS  Genitourinary negative   Musculoskeletal negative musculoskeletal ROS (+) Fibromyalgia -  Abdominal   Peds negative pediatric ROS (+)  Hematology negative hematology ROS (+)   Anesthesia Other Findings   Reproductive/Obstetrics negative OB ROS                           Anesthesia Physical Anesthesia Plan  ASA: III  Anesthesia Plan: MAC   Post-op Pain Management:    Induction: Intravenous  Airway Management Planned: Mask  Additional Equipment:   Intra-op Plan:   Post-operative Plan: Extubation in OR  Informed Consent: I have reviewed the patients History and Physical, chart, labs and discussed the procedure including the risks, benefits and alternatives for the proposed anesthesia with the patient or authorized representative who has indicated his/her understanding and acceptance.   Dental advisory given  Plan Discussed with: CRNA  Anesthesia Plan Comments:         Anesthesia Quick Evaluation

## 2011-01-28 DIAGNOSIS — IMO0001 Reserved for inherently not codable concepts without codable children: Secondary | ICD-10-CM | POA: Diagnosis not present

## 2011-01-28 DIAGNOSIS — M542 Cervicalgia: Secondary | ICD-10-CM | POA: Diagnosis not present

## 2011-01-28 DIAGNOSIS — G8929 Other chronic pain: Secondary | ICD-10-CM | POA: Diagnosis not present

## 2011-01-28 DIAGNOSIS — M5030 Other cervical disc degeneration, unspecified cervical region: Secondary | ICD-10-CM | POA: Diagnosis not present

## 2011-02-07 ENCOUNTER — Encounter: Payer: Self-pay | Admitting: Internal Medicine

## 2011-02-07 ENCOUNTER — Ambulatory Visit (AMBULATORY_SURGERY_CENTER): Payer: Medicare Other | Admitting: Internal Medicine

## 2011-02-07 VITALS — BP 117/68 | HR 73 | Temp 99.4°F | Resp 20 | Ht 65.0 in | Wt 109.0 lb

## 2011-02-07 DIAGNOSIS — R634 Abnormal weight loss: Secondary | ICD-10-CM

## 2011-02-07 DIAGNOSIS — K2100 Gastro-esophageal reflux disease with esophagitis, without bleeding: Secondary | ICD-10-CM

## 2011-02-07 DIAGNOSIS — R0789 Other chest pain: Secondary | ICD-10-CM

## 2011-02-07 DIAGNOSIS — R1319 Other dysphagia: Secondary | ICD-10-CM | POA: Diagnosis not present

## 2011-02-07 DIAGNOSIS — K2270 Barrett's esophagus without dysplasia: Secondary | ICD-10-CM

## 2011-02-07 MED ORDER — SODIUM CHLORIDE 0.9 % IV SOLN: 500.0000 mL | INTRAVENOUS | Status: DC

## 2011-02-07 NOTE — Progress Notes (Signed)
No complaints noted in the recovery room. Maw  Patient did not experience any of the following events: a burn prior to discharge; a fall within the facility; wrong site/side/patient/procedure/implant event; or a hospital transfer or hospital admission upon discharge from the facility. (G8907) Patient did not have preoperative order for IV antibiotic SSI prophylaxis. (G8918)  

## 2011-02-07 NOTE — Progress Notes (Signed)
Propofol administered per s camp crna. See scanned intra procedure report. ewm

## 2011-02-07 NOTE — Op Note (Signed)
[facility] [employer] [place], [place]  [id number]  ENDOSCOPY PROCEDURE REPORT  PATIENT:  Kayla [name], Kayla [name]  MR#:  [id number] BIRTHDATE:  27-Dec-1945, 65 yrs. old  GENDER:  female  ENDOSCOPIST:  Gatha Mayer, MD, [facility]  PROCEDURE DATE:  02/07/2011 PROCEDURE:  EGD with biopsy, 43239, Venia Minks Dilation of Esophagus ASA CLASS:  Class III INDICATIONS:  dysphagia, weight loss, chest pain  MEDICATIONS:   MAC sedation, administered by CRNA, propofol (Diprivan) 200 mg IV TOPICAL ANESTHETIC:  none  DESCRIPTION OF PROCEDURE:   After the risks benefits and alternatives of the procedure were thoroughly explained, informed consent was obtained.  The LB [id number]-[id number] endoscope was introduced through the mouth and advanced to the proximal jejunum, without limitations.  The instrument was slowly withdrawn as the mucosa was fully examined. <<PROCEDUREIMAGES>>  The examination showed some columnar islands in the distal esophagus. Biopsies were taken. Otherwise looked normal s/p gastric bypass and roux-en-Y revision.  Retroflexed views revealed no abnormalities.    The scope was then withdrawn from the patient, a Dotsero passed without much resistance and no heme,  and the procedure completed.  COMPLICATIONS:  None  ENDOSCOPIC IMPRESSION:  1) Columnar changes in distal esophagus- biopsied (? Barrett's) 2) Otherwise normal s/p gastric bypass and Roux-en-Y 3) Maloney dilation performed for dysphagia (has helped in past)  RECOMMENDATIONS: post-dilation diet follow-up as needed will notify re: biopsies  Gatha Mayer, MD, [facility]  CC:  Tami Lin, MD The Patient  n. eSIGNED:   Gatha Mayer at 02/07/2011 03:23 PM  Dwyane Dee, [id number]

## 2011-02-07 NOTE — Patient Instructions (Addendum)
The exam looked the same as before. I biopsied the esophagus to see if you do have Barrett's (last biopsies were ok) and dilated the esophagus like you have had done in the past to help the swallowing. Please follow the special diet instructions. See me as needed. Gatha Mayer, MD, [facility]   See the picture page for your findings from your exam today.  Follow the green and blue discharge instruction sheets the rest of the day.  Resume your prior medications today. Please call if any questions or concerns.

## 2011-02-08 ENCOUNTER — Telehealth: Payer: Self-pay | Admitting: *Deleted

## 2011-02-08 NOTE — Telephone Encounter (Signed)
NO ANSWER, MESSAGE LEFT FOR THE PATIENT. 

## 2011-02-11 DIAGNOSIS — N200 Calculus of kidney: Secondary | ICD-10-CM | POA: Diagnosis not present

## 2011-02-12 ENCOUNTER — Encounter: Payer: Self-pay | Admitting: Internal Medicine

## 2011-02-12 NOTE — Progress Notes (Signed)
Quick Note:  No Barrett's No recall ______

## 2011-02-25 ENCOUNTER — Other Ambulatory Visit: Payer: Self-pay | Admitting: Internal Medicine

## 2011-03-25 ENCOUNTER — Telehealth: Payer: Self-pay

## 2011-03-25 NOTE — Telephone Encounter (Signed)
.  [facility] PT WOULD LIKE [place] TO LOOK IN HER CHART AND SEND ALL HER MEDICINE FOR REFILLS TO THE [facility]. WILL MAKE AN APPT NEXT WEEK WITH DR Beverly Sessions CALL PT AT [phone]

## 2011-03-26 DIAGNOSIS — IMO0001 Reserved for inherently not codable concepts without codable children: Secondary | ICD-10-CM | POA: Diagnosis not present

## 2011-03-26 DIAGNOSIS — M542 Cervicalgia: Secondary | ICD-10-CM | POA: Diagnosis not present

## 2011-03-26 DIAGNOSIS — G8929 Other chronic pain: Secondary | ICD-10-CM | POA: Diagnosis not present

## 2011-03-26 MED ORDER — BUTALBITAL-APAP-CAFFEINE 50-325-40 MG PO TABS: 1.0000 | ORAL_TABLET | Freq: Four times a day (QID) | ORAL | Status: DC | PRN

## 2011-03-26 NOTE — Telephone Encounter (Signed)
Patient notified msg and was put on Kayla [name] schedule for 3/27.  Patient will run out of Fiorecet before then, and asks that we rx locally to [facility].  To MD..Marland Kitchen

## 2011-03-26 NOTE — Telephone Encounter (Signed)
Patient notified rx sent in.

## 2011-03-26 NOTE — Telephone Encounter (Signed)
Too numerous to count meds???? this will be very difficult and will need to wait until she comes in and we can see what she is still taking and what needs changing/she may need help getting in at 104, but could put her in a PA slot on wed aft 27th to help me get it done

## 2011-03-26 NOTE — Telephone Encounter (Signed)
Can we rx?

## 2011-04-01 DIAGNOSIS — R3915 Urgency of urination: Secondary | ICD-10-CM | POA: Diagnosis not present

## 2011-04-01 DIAGNOSIS — N3946 Mixed incontinence: Secondary | ICD-10-CM | POA: Diagnosis not present

## 2011-04-01 DIAGNOSIS — R350 Frequency of micturition: Secondary | ICD-10-CM | POA: Diagnosis not present

## 2011-04-01 DIAGNOSIS — N200 Calculus of kidney: Secondary | ICD-10-CM | POA: Diagnosis not present

## 2011-04-02 DIAGNOSIS — R599 Enlarged lymph nodes, unspecified: Secondary | ICD-10-CM | POA: Diagnosis not present

## 2011-04-02 DIAGNOSIS — N200 Calculus of kidney: Secondary | ICD-10-CM | POA: Diagnosis not present

## 2011-04-10 ENCOUNTER — Ambulatory Visit (INDEPENDENT_AMBULATORY_CARE_PROVIDER_SITE_OTHER): Payer: Medicare Other | Admitting: Internal Medicine

## 2011-04-10 ENCOUNTER — Encounter: Payer: Self-pay | Admitting: Physician Assistant

## 2011-04-10 VITALS — BP 93/53 | HR 77 | Temp 98.2°F | Resp 16 | Ht 65.0 in | Wt 115.0 lb

## 2011-04-10 DIAGNOSIS — G43009 Migraine without aura, not intractable, without status migrainosus: Secondary | ICD-10-CM | POA: Diagnosis not present

## 2011-04-10 DIAGNOSIS — G629 Polyneuropathy, unspecified: Secondary | ICD-10-CM

## 2011-04-10 DIAGNOSIS — F411 Generalized anxiety disorder: Secondary | ICD-10-CM

## 2011-04-10 DIAGNOSIS — G43909 Migraine, unspecified, not intractable, without status migrainosus: Secondary | ICD-10-CM

## 2011-04-10 DIAGNOSIS — F410 Panic disorder [episodic paroxysmal anxiety] without agoraphobia: Secondary | ICD-10-CM

## 2011-04-10 DIAGNOSIS — G479 Sleep disorder, unspecified: Secondary | ICD-10-CM

## 2011-04-10 MED ORDER — CLONAZEPAM 1 MG PO TABS: ORAL_TABLET | ORAL | Status: DC

## 2011-04-10 MED ORDER — GABAPENTIN 100 MG PO CAPS
300.0000 mg | ORAL_CAPSULE | Freq: Three times a day (TID) | ORAL | Status: DC
Start: 2011-04-10 — End: 2011-05-13

## 2011-04-10 MED ORDER — TRAZODONE HCL 100 MG PO TABS: 50.0000 mg | ORAL_TABLET | Freq: Every day | ORAL | Status: DC

## 2011-04-10 MED ORDER — TRIAMCINOLONE ACETONIDE(NASAL) 55 MCG/ACT NA INHA: 2.0000 | Freq: Every day | NASAL | Status: DC | PRN

## 2011-04-10 MED ORDER — ALENDRONATE SODIUM 70 MG PO TABS: 70.0000 mg | ORAL_TABLET | ORAL | Status: DC

## 2011-04-10 MED ORDER — SUMATRIPTAN SUCCINATE 100 MG PO TABS: 100.0000 mg | ORAL_TABLET | ORAL | Status: DC | PRN

## 2011-04-10 MED ORDER — BUTALBITAL-APAP-CAFFEINE 50-325-40 MG PO TABS: 1.0000 | ORAL_TABLET | Freq: Four times a day (QID) | ORAL | Status: DC | PRN

## 2011-04-10 MED ORDER — LIDOCAINE 5 % EX PTCH: 1.0000 | MEDICATED_PATCH | CUTANEOUS | Status: DC | PRN

## 2011-04-10 MED ORDER — PROPRANOLOL HCL 20 MG PO TABS: 20.0000 mg | ORAL_TABLET | ORAL | Status: DC | PRN

## 2011-04-10 MED ORDER — ESOMEPRAZOLE MAGNESIUM 40 MG PO CPDR: 40.0000 mg | DELAYED_RELEASE_CAPSULE | Freq: Every day | ORAL | Status: DC

## 2011-04-10 NOTE — Progress Notes (Signed)
  Subjective:    Patient ID: Kayla [name], female    DOB: [birth year], 66 y.o.   MRN: [id number]  HPI  Dr [name] scheduled patient to see me for a medication review and update med list.  She is enlisted in pain management w/ Dr Vilinda Flake.  He prescribes her hydrocodone and fentanyl.  Kayla [name] has a list of her meds.  Also multiple issues. 1) had lithotripsy last month for a 1.2cm kidney stone.  The urologist has recommended she have surgery for bladder tack/urinary incontinence.  She wishes to hold off on this for now. 2)chronic constipation-usu takes miralax qod 3)wants to know if Dr [name] thinks she should have cervical fusion and spur removal. 4)wants to try osteoporosis meds 5)Stopped her Wellbutrin about 3 months ago.  She has had less anxiety bc she has made her 56 yr old daughter move out. 6)She has been experiencing peripheral neuropathy again and restarted neurontin.  Needs rx  Review of Systems     Objective:   Physical Exam  Constitutional: She is oriented to person, place, and time.       Thin female  HENT:  Head: Normocephalic and atraumatic.  Cardiovascular: Normal rate and regular rhythm.   Pulmonary/Chest: Effort normal and breath sounds normal.  Neurological: She is alert and oriented to person, place, and time.  Skin: Skin is warm and dry.          Assessment & Plan:  Anxiety, depression, migraines, high risk medications,osteoporosis, neuropathy- all meds refilled.  Patient will schedule her MMG which she is due for and she is UTD on her colonoscopy.  Rxs mailed w/ cover sheet  Spent >50 mins face to face with patient.

## 2011-04-23 ENCOUNTER — Encounter: Payer: Self-pay | Admitting: Internal Medicine

## 2011-04-25 ENCOUNTER — Other Ambulatory Visit: Payer: Self-pay | Admitting: Internal Medicine

## 2011-04-25 DIAGNOSIS — Z1231 Encounter for screening mammogram for malignant neoplasm of breast: Secondary | ICD-10-CM

## 2011-04-30 ENCOUNTER — Ambulatory Visit (HOSPITAL_COMMUNITY)
Admission: RE | Admit: 2011-04-30 | Discharge: 2011-04-30 | Disposition: A | Discharge status: Home / Self Care | Payer: Medicare Other | Source: Ambulatory Visit | Attending: Internal Medicine | Admitting: Internal Medicine

## 2011-04-30 DIAGNOSIS — Z1231 Encounter for screening mammogram for malignant neoplasm of breast: Secondary | ICD-10-CM

## 2011-05-08 ENCOUNTER — Ambulatory Visit (INDEPENDENT_AMBULATORY_CARE_PROVIDER_SITE_OTHER): Payer: Medicare Other | Admitting: Internal Medicine

## 2011-05-08 ENCOUNTER — Encounter: Payer: Self-pay | Admitting: Internal Medicine

## 2011-05-08 VITALS — BP 93/58 | HR 79 | Temp 97.1°F | Resp 16 | Ht 65.5 in | Wt 114.4 lb

## 2011-05-08 DIAGNOSIS — G4700 Insomnia, unspecified: Secondary | ICD-10-CM | POA: Diagnosis not present

## 2011-05-08 DIAGNOSIS — R141 Gas pain: Secondary | ICD-10-CM | POA: Diagnosis not present

## 2011-05-08 DIAGNOSIS — F329 Major depressive disorder, single episode, unspecified: Secondary | ICD-10-CM | POA: Diagnosis not present

## 2011-05-08 DIAGNOSIS — R51 Headache: Secondary | ICD-10-CM

## 2011-05-08 DIAGNOSIS — R143 Flatulence: Secondary | ICD-10-CM | POA: Diagnosis not present

## 2011-05-08 DIAGNOSIS — F411 Generalized anxiety disorder: Secondary | ICD-10-CM

## 2011-05-08 DIAGNOSIS — I10 Essential (primary) hypertension: Secondary | ICD-10-CM

## 2011-05-08 DIAGNOSIS — R140 Abdominal distension (gaseous): Secondary | ICD-10-CM

## 2011-05-08 DIAGNOSIS — R413 Other amnesia: Secondary | ICD-10-CM

## 2011-05-08 DIAGNOSIS — R202 Paresthesia of skin: Secondary | ICD-10-CM

## 2011-05-08 DIAGNOSIS — F3289 Other specified depressive episodes: Secondary | ICD-10-CM

## 2011-05-08 NOTE — Patient Instructions (Signed)
Add magnesium around 300mg  a day to help avoid problems from nexium Gluten Free Trial for 3-4 weeks

## 2011-05-08 NOTE — Progress Notes (Signed)
Subjective:    Patient ID: Kayla [name], female    DOB: 1945-06-08, 66 y.o.   MRN: [id number]  HPIHere for followup of multiple problems/her main complaints today relate to abdominal bloating pain with swelling, a recent change in her headaches/concern about her memory loss/and fear that she is developing a MS  She has recently started experiencing sharp shooting firey pains in L frontal area that is unaffecterd by all pain meds/Will occur on and off throughout the day/No visual changes/no nausea and vomiting This headache is in contrast with past migraines and with past occipital headaches secondary to her neck arthritis She also describes recent memory issues-comments by family/Loss of organization/still able to drive/describes being able to remember things later  See hx abn MRI 2005-Eventually felt by neurologist Dr. Glennon Mac and Dr. Catalina Gravel to reveal nothing of concern/She still worries about whether this was an early sign of a ms-in recent months she describes paresthesias of the lower extremities that seem improved when she is on Neurontin/she tried to discontinue Neurontin and felt like she had increased pain in her lower extremities and some curling of her toes  Dr Tananis-injections/pain meds(Fentanyl 50 patch)--cerv arthritis/constant pain  Trying to stay active/working round the house/tired at the end of day-sore--increases pain in arthritic areas  Dr Clovia Cuff gone! But She has bloating after meals/cramps/then diarrhea/staying hungry All the time-complains that hypoglycemia is inactive problem and can be precipitated by various foods including one fourth glass of wine  Off wellbutrin Tried to decrease the number of medicines she takes and=OK Tried to d/c neurontin but lower extrem pain plus paresthesias returned/  Her anxiety is improved and she is down to one Klonopin a day because of changes in her living arrangements  Soc Hx: greatly improved-She finally convinced  her husband to send all of his children and their children to live somewhere else/she no longer has to care for multiple grandchildren many of whom are hooked on drugs and totally disrespectful   Review of SystemsRecent urology workup revealed kidney stones in the renal pelvis but no disease to be concerned about Recent cardiac evaluation is stable Dr. Carlean Purl saw no need to repeat her colonoscopy She has had no further weight loss Her constipation is stable     Objective:   Physical Exam Vital signs are stable with blood pressure 93/58 Pupils equal round reactive to light and accommodation/EOMs conjugate No thyromegaly or nodules Neurological is intact Affect is good Judgment is somewhat overwhelmed by all her illnesses and their potential liabilities        Assessment & Plan:  Problem #1 postprandial bloating, diarrhea, and a long history of symptoms in the gastrointestinal with no clear etiology-- Handout given for gluten-free diet to try for the next 3-4 weeks She is post cholecystectomy and it is important to note that on her CT of the abdomen and pelvis done by urology in November 2012 a mild to moderate intrahepatic biliary dilation of 1.1 cm was noted  Problem #2 concerns about potential memory loss/headaches of 2 types/prior inconclusive workup/paresthesias in the extremities/family history of MS She'll like to repeat her MRI because of the question of small aneurysms in 2005 Review of those scans by Nani Skillern, PAC During a headache evaluation reveals no reason for repeating the study at that point. Since that was 2 years ago and she has new complaints I will schedule a neurological reevaluation  Problem #3 chronic pain management per Dr. Vilinda Flake She has chronic neck arthritis, has been diagnosed  with degenerative disease of the cervical spine by MRI in 2008, has osteoporosis by DEXA scan.  Problem #4 depression with anxiety-more stable over recent weeks with a  decreasing need for anti-anxiety medication  Plan-as above  Her medicines  Were recently refilled/She has decreased trazodone to 25 mg successfully  Current outpatient prescriptions:alendronate (FOSAMAX) 70 MG tablet, Take 1 tablet (70 mg total) by mouth every 7 (seven) days. Take with a full glass of water on an empty stomach., Disp: 12 tablet, Rfl: 3;  butalbital-acetaminophen-caffeine (FIORICET, ESGIC) 50-325-40 MG per tablet, Take 1 tablet by mouth every 6 (six) hours as needed. Headache, Disp: 270 tablet, Rfl: 1 clonazePAM (KLONOPIN) 1 MG tablet, 1/2-1 tabs bid prn anxiety/panic attacks, Disp: 180 tablet, Rfl: 1;  erythromycin ophthalmic ointment, Place 0.5 application into both eyes at bedtime.  , Disp: , Rfl: ;  esomeprazole (NEXIUM) 40 MG capsule, Take 1 capsule (40 mg total) by mouth daily before breakfast., Disp: 90 capsule, Rfl: 3;  fentaNYL (DURAGESIC - DOSED MCG/HR) 25 MCG/HR, 1 patch every 3 (three) days. , Disp: , Rfl:  gabapentin (NEURONTIN) 100 MG capsule, Take 3 capsules (300 mg total) by mouth 3 (three) times daily., Disp: 270 capsule, Rfl: 3;  HYDROcodone-acetaminophen (NORCO) 7.5-325 MG per tablet, Take 1 tablet by mouth every 6 (six) hours as needed. Pain , Disp: , Rfl: ;  lidocaine (LIDODERM) 5 %, Place 1 patch onto the skin as needed. Remove & Discard patch within 12 hours or as directed by MD. Pain, Disp: 90 patch, Rfl: 3 polyethylene glycol powder (GLYCOLAX/MIRALAX) powder, TAKE 17 GRAMS BY MOUTH DAILY IN JUICE, Disp: 527 g, Rfl: 2;  propranolol (INDERAL) 20 MG tablet, Take 1 tablet (20 mg total) by mouth as needed. For angina, Disp: 90 tablet, Rfl: 3;  SUMAtriptan (IMITREX) 100 MG tablet, Take 1 tablet (100 mg total) by mouth every 3 (three) days as needed. Headache, Disp: 48 tablet, Rfl: 3 traZODone (DESYREL) 100 MG tablet, Take 0.5 tablets (50 mg total) by mouth at bedtime., Disp: 90 tablet, Rfl: 3;  triamcinolone (NASACORT) 55 MCG/ACT nasal inhaler, Place 2 sprays into the  nose daily as needed. Allergies, Disp: 3 Inhaler, Rfl: 3;  DISCONTD: buPROPion (WELLBUTRIN) 75 MG tablet, Take 75 mg by mouth daily after breakfast. , Disp: , Rfl:

## 2011-05-09 ENCOUNTER — Telehealth: Payer: Self-pay

## 2011-05-09 NOTE — Telephone Encounter (Signed)
Patient called regarding medication issues.  Patient states her Rx's for Fioricet,Estravidal,Trazadone, and [name] are incorrect dosages, etc.  Please call patient at [phone].  Patient also would like to know what types or brands of bread are gluten free.

## 2011-05-09 NOTE — Telephone Encounter (Signed)
Patient will have to look at the labels of bread themselves. Please get detail of the doses of her meds.

## 2011-05-10 NOTE — Telephone Encounter (Signed)
LMOM to call back

## 2011-05-13 MED ORDER — GABAPENTIN 300 MG PO CAPS: 300.0000 mg | ORAL_CAPSULE | Freq: Three times a day (TID) | ORAL | Status: DC

## 2011-05-13 MED ORDER — CARISOPRODOL 350 MG PO TABS: 350.0000 mg | ORAL_TABLET | Freq: Three times a day (TID) | ORAL | Status: AC | PRN

## 2011-05-13 NOTE — Telephone Encounter (Signed)
Pt rx's for Neurontin and Soma faxed to [facility].  Pt states that she would like her Fiorcet written correctly and sent to [facility].  Pt states that she is taking it 3 times a day.  Tried to advise pt that she should have enough for 3 months but she would like it written correctly.  [facility] [phone].

## 2011-05-13 NOTE — Telephone Encounter (Signed)
Neurontin was changed to 300 mg tablets so she can take 13 times a day and prescription was updated Fioricet was in fact written as if she was taking 3 a day and she was given 270 tablets to cover a 3 month period with 3 refills She has enough trazodone 100 mg tablets to take half a tablet at bedtime and have plenty left over Kayla [name] was written as one 3 times a day when necessary for spasm/This cannot be sent and has to be picked up

## 2011-05-13 NOTE — Telephone Encounter (Signed)
Patient stated that trazadone 25mg  did not work so she went back to 50mg . Her Neurontin was for 100mg  and she states that she takes 300mg .  Her firocet was written for 2 a day and she takes 3 a day and she only got a two month supply.  She did not get a prescription for her estravidal.  And she needs her soma refilled if possible.

## 2011-05-17 MED ORDER — BUTALBITAL-APAP-CAFFEINE 50-325-40 MG PO TABS: 1.0000 | ORAL_TABLET | Freq: Three times a day (TID) | ORAL | Status: DC

## 2011-05-17 NOTE — Telephone Encounter (Signed)
LMOM that Rx was corrected and faxed to [facility]

## 2011-05-17 NOTE — Telephone Encounter (Signed)
order given to fax to [facility]

## 2011-05-24 DIAGNOSIS — M5030 Other cervical disc degeneration, unspecified cervical region: Secondary | ICD-10-CM | POA: Diagnosis not present

## 2011-05-24 DIAGNOSIS — M542 Cervicalgia: Secondary | ICD-10-CM | POA: Diagnosis not present

## 2011-05-24 DIAGNOSIS — Z5181 Encounter for therapeutic drug level monitoring: Secondary | ICD-10-CM | POA: Diagnosis not present

## 2011-05-24 DIAGNOSIS — IMO0001 Reserved for inherently not codable concepts without codable children: Secondary | ICD-10-CM | POA: Diagnosis not present

## 2011-05-24 DIAGNOSIS — Z79899 Other long term (current) drug therapy: Secondary | ICD-10-CM | POA: Diagnosis not present

## 2011-06-17 DIAGNOSIS — R82998 Other abnormal findings in urine: Secondary | ICD-10-CM | POA: Diagnosis not present

## 2011-06-20 DIAGNOSIS — R350 Frequency of micturition: Secondary | ICD-10-CM | POA: Diagnosis not present

## 2011-06-20 DIAGNOSIS — M2550 Pain in unspecified joint: Secondary | ICD-10-CM | POA: Diagnosis not present

## 2011-06-20 DIAGNOSIS — R3915 Urgency of urination: Secondary | ICD-10-CM | POA: Diagnosis not present

## 2011-06-20 DIAGNOSIS — N200 Calculus of kidney: Secondary | ICD-10-CM | POA: Diagnosis not present

## 2011-06-20 DIAGNOSIS — M542 Cervicalgia: Secondary | ICD-10-CM | POA: Diagnosis not present

## 2011-07-03 DIAGNOSIS — N3010 Interstitial cystitis (chronic) without hematuria: Secondary | ICD-10-CM | POA: Diagnosis not present

## 2011-07-03 DIAGNOSIS — N3020 Other chronic cystitis without hematuria: Secondary | ICD-10-CM | POA: Diagnosis not present

## 2011-07-03 DIAGNOSIS — N200 Calculus of kidney: Secondary | ICD-10-CM | POA: Diagnosis not present

## 2011-07-03 DIAGNOSIS — R319 Hematuria, unspecified: Secondary | ICD-10-CM | POA: Diagnosis not present

## 2011-07-03 DIAGNOSIS — R310 Gross hematuria: Secondary | ICD-10-CM | POA: Diagnosis not present

## 2011-07-12 DIAGNOSIS — R413 Other amnesia: Secondary | ICD-10-CM | POA: Diagnosis not present

## 2011-07-12 DIAGNOSIS — G43119 Migraine with aura, intractable, without status migrainosus: Secondary | ICD-10-CM | POA: Diagnosis not present

## 2011-07-12 DIAGNOSIS — R930 Abnormal findings on diagnostic imaging of skull and head, not elsewhere classified: Secondary | ICD-10-CM | POA: Diagnosis not present

## 2011-07-22 ENCOUNTER — Encounter: Payer: Self-pay | Admitting: Cardiovascular Disease

## 2011-07-22 ENCOUNTER — Ambulatory Visit (INDEPENDENT_AMBULATORY_CARE_PROVIDER_SITE_OTHER): Payer: Medicare Other | Admitting: Cardiovascular Disease

## 2011-07-22 VITALS — BP 90/61 | HR 81 | Ht 65.5 in | Wt 119.8 lb

## 2011-07-22 DIAGNOSIS — R55 Syncope and collapse: Secondary | ICD-10-CM

## 2011-07-22 NOTE — Patient Instructions (Addendum)
Your physician has requested that you have an echocardiogram. Echocardiography is a painless test that uses sound waves to create images of your heart. It provides your doctor with information about the size and shape of your heart and how well your heart's chambers and valves are working. This procedure takes approximately one hour. There are no restrictions for this procedure.  Your physician wants you to follow-up in: 6 months unless something found on echo,  You will receive a reminder letter in the mail two months in advance. If you don't receive a letter, please call our office to schedule the follow-up appointment.

## 2011-07-22 NOTE — Assessment & Plan Note (Signed)
[place] continues to have episodes of chest discomfort. We've performed multiple stress test on her in the past and she is not been found to have any coronary artery disease. We'll continue with her current plan. I'll see her again in 6 months for followup office visit.

## 2011-07-22 NOTE — Progress Notes (Signed)
Kayla [name] Date of Birth  [birth year]       [facility] Office [address], [place], [place]  [address]   [place],   [address] [phone]     [phone]   Fax  [phone]    Fax [phone]  Problem List: 1. Mitral valve prolapse-mitral regurgitation 2. Hypertension 3. Barrett's esophagus 4. Fibromyalgia 5. History chest pain-she had a normal heart catheterization in 1993. Stress Myoview study in 2008 was normal. 6. Brain aneurism -  7. S/p Roux-en-Y surgery  for Barretts esophagus  History of Present Illness: Kayla [name] continues to have chest pain.  She takes PRN propranolol which seems to help.  It typically occurs in the afternoon.  Almost always with rest.  She does not get any regular exercise.  She has had lots of bladder infections due to kidney stones recently that have not permitted her to exercise.  She's had lots of problems with anxiety.   Current Outpatient Prescriptions on File Prior to Visit  Medication Sig Dispense Refill  . butalbital-acetaminophen-caffeine (FIORICET, ESGIC) 50-325-40 MG per tablet Take 1 tablet by mouth 3 (three) times daily. Headache  270 tablet  1  . clonazePAM (KLONOPIN) 1 MG tablet 1/2-1 tabs bid prn anxiety/panic attacks  180 tablet  1  . erythromycin ophthalmic ointment Place 0.5 application into both eyes at bedtime.       Marland Kitchen esomeprazole (NEXIUM) 40 MG capsule Take 1 capsule (40 mg total) by mouth daily before breakfast.  90 capsule  3  . fentaNYL (DURAGESIC - DOSED MCG/HR) 25 MCG/HR 1 patch every 3 (three) days.       Marland Kitchen gabapentin (NEURONTIN) 300 MG capsule Take 1 capsule (300 mg total) by mouth 3 (three) times daily.  270 capsule  3  . HYDROcodone-acetaminophen (NORCO) 7.5-325 MG per tablet Take 1 tablet by mouth every 6 (six) hours as needed. Pain       . lidocaine (LIDODERM) 5 % Place 1 patch onto the skin as needed. Remove & Discard patch within 12 hours  or as directed by MD. Pain  90 patch  3  . polyethylene glycol powder (GLYCOLAX/MIRALAX) powder TAKE 17 GRAMS BY MOUTH DAILY IN JUICE  527 g  2  . propranolol (INDERAL) 20 MG tablet Take 1 tablet (20 mg total) by mouth as needed. For angina  90 tablet  3  . solifenacin (VESICARE) 5 MG tablet Take 5 mg by mouth daily.      . SUMAtriptan (IMITREX) 100 MG tablet Take 1 tablet (100 mg total) by mouth every 3 (three) days as needed. Headache  48 tablet  3  . triamcinolone (NASACORT) 55 MCG/ACT nasal inhaler Place 2 sprays into the nose daily as needed. Allergies  3 Inhaler  3  . DISCONTD: traZODone (DESYREL) 100 MG tablet Take 0.5 tablets (50 mg total) by mouth at bedtime.  90 tablet  3  . alendronate (FOSAMAX) 70 MG tablet Take 1 tablet (70 mg total) by mouth every 7 (seven) days. Take with a full glass of water on an empty stomach.  12 tablet  3  . DISCONTD: buPROPion (WELLBUTRIN) 75 MG tablet Take 75 mg by mouth daily after breakfast.         Allergies  Allergen Reactions  . Codeine Anaphylaxis  . Oxycodone-Acetaminophen Shortness Of Breath  . Acetaminophen-Codeine Other (See Comments)    Shaky-chest feels heavy  . Darvocet (Propoxyphene-Acetaminophen)   .  Food Swelling    Peanuts-wheat-  . Lorcet (Hydrocodone-Acetaminophen)   . Opium     hallucinations  . Oxycodone-Acetaminophen Other (See Comments)    Chest pain , nausea , pass out  . Zolpidem Tartrate Other (See Comments)    Makes her go crazy  . Zolpidem Tartrate     hallucinations    Past Medical History  Diagnosis Date  . MVP (mitral valve prolapse)   . Barrett esophagus     not consistently present  . History of kidney stones   . Anxiety   . IBS (irritable bowel syndrome)   . Complication of anesthesia     either  BP or pulse dropped with last surgery  . Angina     takes Propanolol prn and it stops her chest  . Chronic kidney disease     kidney stones and UTI  . Depression     post traumatic stress  disorder  .  Hypertension     3 small brain aneurysms  . GERD (gastroesophageal reflux disease)   . DDD (degenerative disc disease)     cervical and lumbar  . Osteoarthritis     all over  . Headache     migraines  . Dysrhythmia     brought on by stress  . Peripheral vascular disease     superficial phlebitis in left calf  . Diabetes mellitus     sugar goes up and down diet controlled  . Anemia     anemia in the past  . Fibromyalgia     neuropathy knees ankles and toes  . Hiatal hernia     Past Surgical History  Procedure Date  . US echocardiography 01/21/2007    EF 55-60%  . US echocardiography 08/30/2003    EF 55-60%  . Cardiovascular stress test 10/03/2006  . Esophagus surgery   . Gastric bypass 1999  . Appendectomy   . Cholecystectomy   . Abdominal hysterectomy   . Gastric restriction surgery 1991  . Tubal ligation   . Right knee arthroscopy   . Right arm surgery   . Upper gastrointestinal endoscopy 06/23/2008    w/Dil, Barrett's esophagus  . Colonoscopy 06/23/2008    normal  . Tonsillectomy   . Cardiac catheterization 03/22/1991    EF 61%  . Stomach stappeling 1991  . Dilation and curettage of uterus     x2  . Exploratory laparotomy 1993  . Stone extraction with basket 2012  . Rt wrist fx 2009    History  Smoking status  . Never Smoker   Smokeless tobacco  . Never Used    History  Alcohol Use No    Family History  Problem Relation Age of Onset  . Stroke Mother   . Lung cancer Mother   . Heart disease Mother   . Diabetes Mother   . Hypertension Father   . Stroke Father   . Lung cancer Father   . Heart disease Father   . Kidney disease Father   . Seizures Father     epilepsy, and sisters x 2  . Pancreatic cancer Sister   . Lung cancer Sister   . Colon cancer Maternal Aunt     12 relatives  . Uterine cancer      aunt  . Clotting disorder Sister   . Heart disease      grandmother  . Heart disease Sister     x 2  . Kidney disease Sister     x  2  .  Breast cancer Sister   . Colon cancer Paternal Aunt     Reviw of Systems:  Reviewed in the HPI.  All other systems are negative.  Physical Exam: Blood pressure 90/61, pulse 81, height 5' 5.5" (1.664 m), weight 119 lb 12.8 oz (54.341 kg). General: Well developed, well nourished, in no acute distress.  Head: Normocephalic, atraumatic, sclera non-icteric, mucus membranes are moist,   Neck: Supple. Carotids are 2 + without bruits. No JVD  Lungs: Clear bilaterally to auscultation.  Heart: regular rate.  normal  S1 S2. She has a very soft systolic murmur.   Abdomen: Soft, non-tender, non-distended with normal bowel sounds. No hepatomegaly. No rebound/guarding. No masses.  Msk:  Strength and tone are normal  Extremities: No clubbing or cyanosis. No edema.  Distal pedal pulses are 2+ and equal bilaterally.  Neuro: Alert and oriented X 3. Moves all extremities spontaneously.  Psych:  Responds to questions appropriately with a normal affect.  ECG:  Assessment / Plan:

## 2011-07-23 DIAGNOSIS — R930 Abnormal findings on diagnostic imaging of skull and head, not elsewhere classified: Secondary | ICD-10-CM | POA: Diagnosis not present

## 2011-07-23 DIAGNOSIS — G43909 Migraine, unspecified, not intractable, without status migrainosus: Secondary | ICD-10-CM | POA: Diagnosis not present

## 2011-07-23 DIAGNOSIS — R413 Other amnesia: Secondary | ICD-10-CM | POA: Diagnosis not present

## 2011-07-25 ENCOUNTER — Ambulatory Visit (HOSPITAL_COMMUNITY): Payer: Medicare Other | Attending: Internal Medicine

## 2011-07-25 DIAGNOSIS — I739 Peripheral vascular disease, unspecified: Secondary | ICD-10-CM | POA: Insufficient documentation

## 2011-07-25 DIAGNOSIS — I059 Rheumatic mitral valve disease, unspecified: Secondary | ICD-10-CM | POA: Diagnosis not present

## 2011-07-25 DIAGNOSIS — I10 Essential (primary) hypertension: Secondary | ICD-10-CM | POA: Diagnosis not present

## 2011-07-25 DIAGNOSIS — R072 Precordial pain: Secondary | ICD-10-CM | POA: Diagnosis not present

## 2011-07-25 DIAGNOSIS — E119 Type 2 diabetes mellitus without complications: Secondary | ICD-10-CM | POA: Insufficient documentation

## 2011-07-25 DIAGNOSIS — R55 Syncope and collapse: Secondary | ICD-10-CM

## 2011-07-25 DIAGNOSIS — K2270 Barrett's esophagus without dysplasia: Secondary | ICD-10-CM | POA: Diagnosis not present

## 2011-07-25 DIAGNOSIS — I671 Cerebral aneurysm, nonruptured: Secondary | ICD-10-CM | POA: Insufficient documentation

## 2011-07-25 NOTE — Progress Notes (Signed)
Echocardiogram performed.  

## 2011-07-26 ENCOUNTER — Telehealth: Payer: Self-pay | Admitting: Cardiovascular Disease

## 2011-07-26 NOTE — Telephone Encounter (Signed)
Pt called with results

## 2011-07-26 NOTE — Telephone Encounter (Signed)
Pt calling for echo results, pls call

## 2011-07-31 DIAGNOSIS — F39 Unspecified mood [affective] disorder: Secondary | ICD-10-CM | POA: Diagnosis not present

## 2011-07-31 DIAGNOSIS — F339 Major depressive disorder, recurrent, unspecified: Secondary | ICD-10-CM | POA: Diagnosis not present

## 2011-08-07 ENCOUNTER — Ambulatory Visit: Payer: Medicare Other | Admitting: Internal Medicine

## 2011-08-22 DIAGNOSIS — F39 Unspecified mood [affective] disorder: Secondary | ICD-10-CM | POA: Diagnosis not present

## 2011-08-29 DIAGNOSIS — M2550 Pain in unspecified joint: Secondary | ICD-10-CM | POA: Diagnosis not present

## 2011-08-29 DIAGNOSIS — F329 Major depressive disorder, single episode, unspecified: Secondary | ICD-10-CM | POA: Diagnosis not present

## 2011-08-29 DIAGNOSIS — M542 Cervicalgia: Secondary | ICD-10-CM | POA: Diagnosis not present

## 2011-08-29 DIAGNOSIS — IMO0001 Reserved for inherently not codable concepts without codable children: Secondary | ICD-10-CM | POA: Diagnosis not present

## 2011-09-03 ENCOUNTER — Telehealth: Payer: Self-pay

## 2011-09-03 NOTE — Telephone Encounter (Signed)
Pt states that she needs all of her med refills faxed to [facility] and that the butalbital acetaminophen she needs it to be faxed as well but since it takes a month to get meds she needs [facility] [address]  Because she will be out on 09/05/11.  Best number [phone]

## 2011-09-04 ENCOUNTER — Other Ambulatory Visit: Payer: Self-pay | Admitting: Internal Medicine

## 2011-09-04 DIAGNOSIS — F339 Major depressive disorder, recurrent, unspecified: Secondary | ICD-10-CM | POA: Diagnosis not present

## 2011-09-04 DIAGNOSIS — F39 Unspecified mood [affective] disorder: Secondary | ICD-10-CM | POA: Diagnosis not present

## 2011-09-04 NOTE — Telephone Encounter (Signed)
Go to medications in 1st bar and see many meds with refills X3 ? Why refills now /which ones are actually due?

## 2011-09-04 NOTE — Telephone Encounter (Signed)
Please advise patient to contact her pharmacy and have them send [place] a refill request. That is how we handle refills.

## 2011-09-04 NOTE — Telephone Encounter (Signed)
Have left message for her to call me back about this.

## 2011-09-04 NOTE — Telephone Encounter (Signed)
Pt CB and asked for 1 mos of [facility] and [facility] mos to [facility]. I have asked [facility] to have [facility] 30 days of butal acet per Ryan's request. All of her other Rxs she wants sent for 3 mos to [place]. Pt requests that Dr [name] increase her Trazadone from 100 mg to 150 mg Qhs d/t her waking up 3 -5 x night and getting awake at 3-4:00 and not being able to get back to sleep. Dr [name], do you want to make this change? I have checked strengths/doses of all meds w/pt and have put them into meds and orders in phone message for Dr [name] to review. I have them all set to PRINT so that we can mail the to [facility] (order form is in pt's chart [id number])  Dr [name], I have not put in Trazadone bc I wasn't sure what strength you wanted to RX. Also when going over list w/pt I asked her if she takes Volaren QD and she said "yes", but EPIC shows last time filled was for BID, so I put it in as BID, but please check that that is what you want pt on. Also, I didn't give her any RFs since she was here 3 mos ago, so you may want to add RFs if appropriate.

## 2011-09-05 ENCOUNTER — Other Ambulatory Visit: Payer: Self-pay | Admitting: Internal Medicine

## 2011-09-05 NOTE — Telephone Encounter (Signed)
LMOM for pt to CB. Has she checked w/[facility] for RFs? She should have RFs on file for most because the [facility] Rxs [facility] (I called and checked and the only Rx they have RFs on is Vesicare which was Rxd by hospitalist), so I assume the lastest Rxs went to [facility] w/ RFs left on all except 1. Klonopin which she should need 10/11/11, 2. Miralax needed now, 3. Trazadone (at new dose?), 4.Vesicare (pt has 8 RFs left at [facility]), 5.Voltaren ? - hasn't been ordered in EPIC.

## 2011-09-05 NOTE — Telephone Encounter (Signed)
Patient still has 1 refill left.  Deny.

## 2011-09-06 MED ORDER — CLONAZEPAM 1 MG PO TABS: ORAL_TABLET | ORAL | Status: DC

## 2011-09-06 MED ORDER — TRAZODONE HCL 150 MG PO TABS: 150.0000 mg | ORAL_TABLET | Freq: Every day | ORAL | Status: DC

## 2011-09-06 MED ORDER — GABAPENTIN 300 MG PO CAPS: 300.0000 mg | ORAL_CAPSULE | Freq: Three times a day (TID) | ORAL | Status: DC

## 2011-09-06 MED ORDER — BUTALBITAL-APAP-CAFFEINE 50-325-40 MG PO TABS: 1.0000 | ORAL_TABLET | Freq: Three times a day (TID) | ORAL | Status: DC

## 2011-09-06 MED ORDER — DICLOFENAC SODIUM 25 MG PO TBEC: 25.0000 mg | DELAYED_RELEASE_TABLET | Freq: Two times a day (BID) | ORAL | Status: DC

## 2011-09-06 MED ORDER — SOLIFENACIN SUCCINATE 5 MG PO TABS: 5.0000 mg | ORAL_TABLET | Freq: Every day | ORAL | Status: DC

## 2011-09-06 NOTE — Addendum Note (Signed)
Addended by: Leandrew Koyanagi on: 09/06/2011 12:18 PM   Modules accepted: Orders

## 2011-09-06 NOTE — Telephone Encounter (Signed)
I called [facility] and got a list of meds pt needs RFs on (all others she has two 90 day RFs left). Pt needs: Fioricet, klonopin, voltaren, vesicare, and she only has one 30 day RF for gabapentin on file. I will take others off list. [facility] and [facility]Fs, spoke w/pt again and gave her the list of meds that she already has RFs on file so that she can get those ordered.  Pt still would like for a new Rx for Trazadone to be sent in to [facility] if possible at increased strength of 150 mg if Dr [name] agrees.

## 2011-09-06 NOTE — Telephone Encounter (Addendum)
Meds ordered this encounter  Medications  . butalbital-acetaminophen-caffeine (FIORICET, ESGIC) 50-325-40 MG per tablet    Sig: Take 1 tablet by mouth 3 (three) times daily. Headache    Dispense:  270 tablet    Refill:  1    Fax to [facility] [phone]  . clonazePAM (KLONOPIN) 1 MG tablet    Sig: 1/2-1 tabs bid prn anxiety/panic attacks    Dispense:  180 tablet    Refill:  1  . diclofenac (VOLTAREN) 25 MG EC tablet    Sig: Take 1 tablet (25 mg total) by mouth 2 (two) times daily.    Dispense:  180 tablet    Refill:  3  . gabapentin (NEURONTIN) 300 MG capsule    Sig: Take 1 capsule (300 mg total) by mouth 3 (three) times daily.    Dispense:  270 capsule    Refill:  3  . solifenacin (VESICARE) 5 MG tablet    Sig: Take 1 tablet (5 mg total) by mouth daily.    Dispense:  90 tablet    Refill:  3    -  trazadone increased to 150mg  #90 1 hs All of these were faxed to [facility]

## 2011-09-24 DIAGNOSIS — F339 Major depressive disorder, recurrent, unspecified: Secondary | ICD-10-CM | POA: Diagnosis not present

## 2011-09-24 DIAGNOSIS — F39 Unspecified mood [affective] disorder: Secondary | ICD-10-CM | POA: Diagnosis not present

## 2011-10-02 ENCOUNTER — Ambulatory Visit (INDEPENDENT_AMBULATORY_CARE_PROVIDER_SITE_OTHER): Payer: Medicare Other | Admitting: Internal Medicine

## 2011-10-02 ENCOUNTER — Encounter: Payer: Self-pay | Admitting: Internal Medicine

## 2011-10-02 VITALS — BP 96/62 | HR 95 | Temp 98.8°F | Resp 16 | Ht 65.0 in | Wt 114.4 lb

## 2011-10-02 DIAGNOSIS — N3941 Urge incontinence: Secondary | ICD-10-CM

## 2011-10-02 DIAGNOSIS — R82998 Other abnormal findings in urine: Secondary | ICD-10-CM | POA: Diagnosis not present

## 2011-10-02 DIAGNOSIS — R8281 Pyuria: Secondary | ICD-10-CM

## 2011-10-02 DIAGNOSIS — Z23 Encounter for immunization: Secondary | ICD-10-CM

## 2011-10-02 LAB — POCT UA - MICROSCOPIC ONLY
Casts, Ur, LPF, POC: NEGATIVE
Crystals, Ur, HPF, POC: NEGATIVE
Epithelial cells, urine per micros: NEGATIVE

## 2011-10-02 LAB — POCT URINALYSIS DIPSTICK
Protein, UA: NEGATIVE
Spec Grav, UA: 1.02
Urobilinogen, UA: 0.2
pH, UA: 5

## 2011-10-02 MED ORDER — NITROFURANTOIN MONOHYD MACRO 100 MG PO CAPS: 100.0000 mg | ORAL_CAPSULE | Freq: Two times a day (BID) | ORAL | Status: DC
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
## 2011-10-03 ENCOUNTER — Telehealth: Payer: Self-pay | Admitting: Radiology

## 2011-10-03 NOTE — Telephone Encounter (Signed)
I have called patient to advise and she is not feeling better yet, but feels like she will improve once she gets more antibiotics on board for her kidneys. She will let us know if she needs anything else.

## 2011-10-03 NOTE — Telephone Encounter (Signed)
Message copied by Candice Camp on Thu Oct 03, 2011  8:41 AM ------      Message from: Leandrew Koyanagi      Created: Wed Oct 02, 2011 10:53 PM       Please call and tell her that the brain MRI MRA was all normal

## 2011-10-09 MED ORDER — CEPHALEXIN 500 MG PO CAPS: 500.0000 mg | ORAL_CAPSULE | Freq: Three times a day (TID) | ORAL | Status: DC

## 2011-10-09 NOTE — Addendum Note (Signed)
Addended by: Leandrew Koyanagi on: 10/09/2011 07:07 PM   Modules accepted: Orders

## 2011-10-10 ENCOUNTER — Telehealth: Payer: Self-pay

## 2011-10-10 NOTE — Telephone Encounter (Signed)
Pt c/o sore throat for couple weeks now feels swollen. She is asking if her husbands Keflex would help her throat. I advised Keflex does not cover strep throat, and she really needs to be evaluated if she has sore throat she will try to come in tomorrow for this.
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
## 2011-10-16 ENCOUNTER — Encounter: Payer: Self-pay | Admitting: *Deleted

## 2011-10-16 DIAGNOSIS — N3010 Interstitial cystitis (chronic) without hematuria: Secondary | ICD-10-CM

## 2011-10-16 HISTORY — DX: Interstitial cystitis (chronic) without hematuria: N30.10

## 2011-10-17 ENCOUNTER — Encounter: Payer: Self-pay | Admitting: *Deleted

## 2011-10-17 ENCOUNTER — Telehealth: Payer: Self-pay | Admitting: Cardiovascular Disease

## 2011-10-17 NOTE — Telephone Encounter (Signed)
See letter,

## 2011-10-17 NOTE — Telephone Encounter (Signed)
Call pt Monday regarding insurance and dx of htn. Pt informed I will call her then, she agreed to plan.

## 2011-10-17 NOTE — Telephone Encounter (Signed)
Pt return call to patient at hm# regarding ltr for life insurance company saying she doesn't have mitro valve prolapse.

## 2011-10-22 NOTE — Telephone Encounter (Signed)
Told pt we can not remove her dx of htn even though she has never been hypertensive in the office, she came to use from pcp with that dx and is on her pt questionnaire, told her to taslk with pcp, she agreed to plan. Will mail out letter to resolve MVP.

## 2011-10-24 DIAGNOSIS — F329 Major depressive disorder, single episode, unspecified: Secondary | ICD-10-CM | POA: Diagnosis not present

## 2011-10-24 DIAGNOSIS — F339 Major depressive disorder, recurrent, unspecified: Secondary | ICD-10-CM | POA: Diagnosis not present

## 2011-10-24 DIAGNOSIS — F39 Unspecified mood [affective] disorder: Secondary | ICD-10-CM | POA: Diagnosis not present

## 2011-10-24 DIAGNOSIS — M2550 Pain in unspecified joint: Secondary | ICD-10-CM | POA: Diagnosis not present

## 2011-11-29 ENCOUNTER — Encounter: Payer: Self-pay | Admitting: Cardiovascular Disease

## 2011-12-02 ENCOUNTER — Ambulatory Visit (INDEPENDENT_AMBULATORY_CARE_PROVIDER_SITE_OTHER): Payer: Medicare Other | Admitting: Internal Medicine

## 2011-12-02 ENCOUNTER — Encounter: Payer: Self-pay | Admitting: Cardiovascular Disease

## 2011-12-02 VITALS — BP 97/61 | HR 87 | Temp 98.2°F | Resp 16 | Ht 65.3 in | Wt 116.0 lb

## 2011-12-02 DIAGNOSIS — R32 Unspecified urinary incontinence: Secondary | ICD-10-CM

## 2011-12-02 DIAGNOSIS — R350 Frequency of micturition: Secondary | ICD-10-CM

## 2011-12-02 DIAGNOSIS — R109 Unspecified abdominal pain: Secondary | ICD-10-CM

## 2011-12-02 DIAGNOSIS — G4700 Insomnia, unspecified: Secondary | ICD-10-CM

## 2011-12-02 DIAGNOSIS — R6884 Jaw pain: Secondary | ICD-10-CM

## 2011-12-02 DIAGNOSIS — J019 Acute sinusitis, unspecified: Secondary | ICD-10-CM | POA: Diagnosis not present

## 2011-12-02 DIAGNOSIS — IMO0001 Reserved for inherently not codable concepts without codable children: Secondary | ICD-10-CM

## 2011-12-02 DIAGNOSIS — R110 Nausea: Secondary | ICD-10-CM

## 2011-12-02 LAB — POCT UA - MICROSCOPIC ONLY
Casts, Ur, LPF, POC: NEGATIVE
Mucus, UA: NEGATIVE

## 2011-12-02 LAB — POCT URINALYSIS DIPSTICK
Bilirubin, UA: NEGATIVE
Ketones, UA: NEGATIVE
Leukocytes, UA: NEGATIVE
Spec Grav, UA: 1.02
pH, UA: 5.5

## 2011-12-02 MED ORDER — AMOXICILLIN 875 MG PO TABS: 875.0000 mg | ORAL_TABLET | Freq: Two times a day (BID) | ORAL | Status: DC

## 2011-12-02 MED ORDER — DICYCLOMINE HCL 10 MG PO CAPS: ORAL_CAPSULE | ORAL | Status: DC

## 2011-12-02 NOTE — Patient Instructions (Signed)
Use amoxicillin for sinusitis Take probiotics with the antibiotics See your dentist about the facial pain

## 2011-12-02 NOTE — Progress Notes (Signed)
Subjective:    Patient ID: Kayla [name], female    DOB: Jun 27, 1945, 66 y.o.   MRN: [id number]  HPI Patient presents with multiple complaints, has continued urinary frequency, but is better. Continues w/ Kayla Urol eval. Nocturia x 3.   also has sore throat nasal congestion, states "always has sinus problems" pain across sinuses, pressure.Present 3-4 weeks and getting worse Yellow d/c in AM   States has had ?cyst near left TMJ, painful present for about a year.No pain w/ chew, just w/ pressure   She states she would like to discontinue the Trazodone, does not feel like this medication is effective for insomnia anymore, just feels drugged when wakes for urination. ....... She goes to sleep within 5 minutes of taking it, but wakes up several hours later, states she feels as if her body needs more and more of this medication. She asks for advise of the best way to discontinue this medication.   Also with complaints of abdominal cramping after meals, worse after a "greasy" meal. States she has seen Dr Kayla [name] for this in the past. Wants a referral back to him. She denies any fever with this cramping. She does have some nausea at times with the cramping. States cramping worse in the evening, but occurs with any meal. Last endoscopy by Dr. Carlean [name] ruled out Barrett's esophagus which she thought she had for years.   Patient states she has noticed memory problems recently also. She is being evaluated by [name] neurological and has had a recent repeat MRI of the brain which is normal. She continues with sharp shooting pains throughout the head frequently and no known precipitators no associated vision changes or syncope. She states she is happy her neurology work up did not show any severe findings, but she does have some complaints of headaches, and is very concerned about her memory, has noticed getting worse recently. We have discussed this in the past several times and she will get mental status  exam as part of her followup with neurology.  PMH Patient Active Problem List  Diagnosis  . ANXIETY  . DEPRESSION  . GERD  . HIATAL HERNIA  . OSTEOARTHRITIS  . DISC DISEASE, CERVICAL  . DISC DISEASE, LUMBAR  . FIBROMYALGIA  . WEIGHT LOSS  . PERSONAL HISTORY OF FAILED MODERATE SEDATION  . Chronic constipation  . Chest pain  . Chronic interstitial cystitis   although her relationship with husband is better she continues with marked family dysfunction particularly with her daughter  Review of Systems Weight continues to vary between 100 and 115 pounds    Often has night sweats but no fever No vision changes  No chest pain or palpitations  No diarrhea particularly no hematochezia or melena  Objective:   Physical Exam Filed Vitals:   12/02/11 1641  BP: 97/61  Pulse: 87  Temp: 98.2 F (36.8 C)  Resp: 16   weight 116 pounds Pupils equal round reactive to light and accommodation/EOMs conjugate TMs clear Nares with purulent discharge Tender maxillary and frontal sinuses to percussion Throat clear No nodes or thyromegaly Has tenderness over her left maxilla but not in TMJ, does not appear swollen, and no cyst or lymphadenopathy in this area .there is no parotid gland hypertrophy . No abscess molar  No wheeze, lungs clear Heart rate regular without murmur or gallop Abdomen is soft and nondistended/bowel sounds are present /no organomegaly Liver is nontender/generally tender in the peri Umbilical and right lower quadrant areas without mass  Cranial  nerves II through XII intact Gait normal Oriented to person place Remembers both near and far advanced Language is appropriate Mood stable/affect is not depressed    Results for orders placed in visit on 12/02/11  POCT URINALYSIS DIPSTICK      Component Value Range   Color, UA yellow     Clarity, UA clear     Glucose, UA neg     Bilirubin, UA neg     Ketones, UA neg     Spec Grav, UA 1.020     Blood, UA trace-intact      pH, UA 5.5     Protein, UA neg     Urobilinogen, UA 0.2     Nitrite, UA neg     Leukocytes, UA Negative    POCT UA - MICROSCOPIC ONLY      Component Value Range   WBC, Ur, HPF, POC 0-2     RBC, urine, microscopic 0-1     Bacteria, U Microscopic trace     Mucus, UA neg     Epithelial cells, urine per micros 0-2     Crystals, Ur, HPF, POC neg     Casts, Ur, LPF, POC neg     Yeast, UA neg        Assessment & Plan:   problem #1 chronic sinusitisAmoxicillin875 mg  for 10 days for sinusitis  problem #2 insomnia-Tapering schedule given for the Trazodone take 100 mg for 1 week, then take 50 mg for 1  week, then discontinue this medication/resolution of insomnia may relate to resolving her nocturia   problem #3 pain in the left malar area without swelling -To dentist for  evaluation problem #4 abdominal pain of uncertain etiology /? IBS versus other pathologic diagnosis /she would like to return  To Dr Kayla [name] for cramping of abdomen. Bowel suggest a trial of Bentyl 10 mgat 5 PM in the afternoon   when her symptoms seem to be most pronounced (prior to his evaluation ) ,Rx #10  problem #5 urgency and frequency --Reassurance given about urinary tract, does not appear to be urinary tract  infection Has follow up scheduled with Kayla [name] for bladder appears to be from interstitial cystitis, will  discuss with [name].  P#6 neurological complaints-to continue [name] - both headaches and memory Kayla [name]    her other problems continued to present trouble throat pain and mobility standpoint and these include cervical and lumbar disc disease as well as fibromyalgia. She is in chronic pain management and on fentanyl  Meds ordered this encounter  . amoxicillin (AMOXIL) 875 MG tablet    Sig: Take 1 tablet (875 mg total) by mouth 2 (two) times daily.    Dispense:  20 tablet    Refill:  0  . dicyclomine (BENTYL) 10 MG capsule    Sig: One tablet at 5pm, daily to help with  abdominal cramping    Dispense:  10 capsule    Refill:  0   45 minute office visit

## 2011-12-04 DIAGNOSIS — F339 Major depressive disorder, recurrent, unspecified: Secondary | ICD-10-CM | POA: Diagnosis not present

## 2011-12-04 DIAGNOSIS — F39 Unspecified mood [affective] disorder: Secondary | ICD-10-CM | POA: Diagnosis not present

## 2011-12-06 DIAGNOSIS — G43909 Migraine, unspecified, not intractable, without status migrainosus: Secondary | ICD-10-CM | POA: Diagnosis not present

## 2011-12-06 DIAGNOSIS — G43119 Migraine with aura, intractable, without status migrainosus: Secondary | ICD-10-CM | POA: Diagnosis not present

## 2011-12-06 DIAGNOSIS — R51 Headache: Secondary | ICD-10-CM | POA: Diagnosis not present

## 2011-12-06 DIAGNOSIS — R413 Other amnesia: Secondary | ICD-10-CM | POA: Diagnosis not present

## 2011-12-10 ENCOUNTER — Telehealth: Payer: Self-pay

## 2011-12-10 MED ORDER — LEVOFLOXACIN 500 MG PO TABS: 500.0000 mg | ORAL_TABLET | Freq: Every day | ORAL | Status: DC

## 2011-12-10 NOTE — Telephone Encounter (Signed)
Patient advised.

## 2011-12-10 NOTE — Telephone Encounter (Signed)
Spoke with pt, she states her head and face is hurting and she has been in bed all day. She is taking the amoxicillin but she states she doesn't feel better. She would like to know hat we could do for her. I advised her Dr [name] will not be in until next week. She would like someone else to review her chart and advise.

## 2011-12-10 NOTE — Telephone Encounter (Signed)
I have sent in another abx.  She should use mucinex.

## 2011-12-24 ENCOUNTER — Ambulatory Visit: Payer: Medicare Other | Admitting: Internal Medicine

## 2012-01-01 DIAGNOSIS — F339 Major depressive disorder, recurrent, unspecified: Secondary | ICD-10-CM | POA: Diagnosis not present

## 2012-01-01 DIAGNOSIS — M2550 Pain in unspecified joint: Secondary | ICD-10-CM | POA: Diagnosis not present

## 2012-01-01 DIAGNOSIS — F39 Unspecified mood [affective] disorder: Secondary | ICD-10-CM | POA: Diagnosis not present

## 2012-01-03 DIAGNOSIS — S93609A Unspecified sprain of unspecified foot, initial encounter: Secondary | ICD-10-CM | POA: Diagnosis not present

## 2012-01-16 DIAGNOSIS — F339 Major depressive disorder, recurrent, unspecified: Secondary | ICD-10-CM | POA: Diagnosis not present

## 2012-01-16 DIAGNOSIS — F39 Unspecified mood [affective] disorder: Secondary | ICD-10-CM | POA: Diagnosis not present

## 2012-01-19 ENCOUNTER — Telehealth: Payer: Self-pay

## 2012-01-19 NOTE — Telephone Encounter (Signed)
Ms. Kreuz would like someone to review the medications that need to be refilled now.  She is out of the Tylenol - Hydrocodone  [phone] (H

## 2012-01-21 NOTE — Telephone Encounter (Signed)
because she gets her chronic pain medications from a chronic pain clinic, refilling her hydrocodone may be a violation of their policy Ask her why she needs this/call the clinic for Dr. Joaquin Courts point, to be sure it is ok My review of her other medicines suggest nothing runs out til February, but she should check the bottles that she has to see the ones that have no refill on them for February

## 2012-01-22 ENCOUNTER — Other Ambulatory Visit: Payer: Self-pay | Admitting: Physician Assistant

## 2012-01-22 NOTE — Telephone Encounter (Signed)
I called patient to advise, her request may be a violation of her pain contract. I have left message for her to call me back. I have been unable to locate a phone number for Dr Vilinda Flake, do you know which office he is in?

## 2012-01-22 NOTE — Telephone Encounter (Signed)
Hollice Espy tananis [phone]

## 2012-01-22 NOTE — Telephone Encounter (Signed)
PT WOULD LIKE TO SPEAK WITH SOMEONE REGARDING HER MEDS THAT SHE GET FROM THE [facility]. STATES THEY HAVEN'T HEARD FROM ANYONE HERE PLEASE CALL [phone] OR HER CELL AT [phone]

## 2012-01-22 NOTE — Telephone Encounter (Signed)
Tabitha CMA for DR Vilinda Flake has advised she does get the Hydrocodone from them at [facility]. She is not due for a refill until Feb 15th. She will advise the Dr of the requested medication from here. Laney has still not called me back. FYI

## 2012-01-22 NOTE — Telephone Encounter (Signed)
Thanks. I have called his office to advise. I have left message for Tabitha to advise.

## 2012-01-23 ENCOUNTER — Other Ambulatory Visit: Payer: Self-pay | Admitting: Physician Assistant

## 2012-01-23 DIAGNOSIS — R519 Headache, unspecified: Secondary | ICD-10-CM

## 2012-01-23 NOTE — Telephone Encounter (Signed)
Now have gotten request for Fioricet. Have already called her pain management about Vicodin. FYI only

## 2012-01-23 NOTE — Telephone Encounter (Signed)
PATIENT CALLED IN REGARDS TO REFILL ON ANTIBIOTIC AND FURASET (FOR HEADACHES ) BECAUSE HER MUCUS IS WORSE AND WOULD LIKE TO EITHER GET THIS REFILLED IF POSSIBLE BY DR. [name]. PATIENT SAYS  SHE HAS PUT IN THIS REQUEST EARLIER THIS WEEK AND SPOKE TO AMY. PATIENT SAYS SHE WAS MISUNDERSTOOD AND DOES NOT WANT A REFILL ON HYDROCODONE BUT FOR BOTH RX'S ABOVE. [place] YOU! BEST NUMBER IS [phone].Marland Kitchen

## 2012-01-23 NOTE — Telephone Encounter (Signed)
I have called patient about her meds. She is requesting Rx for her Fioricet. She is also requesting a refill for her Levaquin, for her sinuses, states she is still having headaches from her sinuses. Please advise.

## 2012-01-24 ENCOUNTER — Other Ambulatory Visit: Payer: Self-pay | Admitting: Internal Medicine

## 2012-01-24 ENCOUNTER — Telehealth: Payer: Self-pay | Admitting: Radiology

## 2012-01-24 DIAGNOSIS — J329 Chronic sinusitis, unspecified: Secondary | ICD-10-CM

## 2012-01-24 MED ORDER — BUTALBITAL-APAP-CAFFEINE 50-325-40 MG PO TABS: 1.0000 | ORAL_TABLET | Freq: Three times a day (TID) | ORAL | Status: DC

## 2012-01-24 MED ORDER — AMOXICILLIN-POT CLAVULANATE 875-125 MG PO TABS: 1.0000 | ORAL_TABLET | Freq: Two times a day (BID) | ORAL | Status: DC

## 2012-01-24 NOTE — Telephone Encounter (Signed)
Requesting fiorcet///does she need refill thru mailorder or at pharmacy or both?? Meds ordered this encounter  Medications  . butalbital-acetaminophen-caffeine (FIORICET, ESGIC) 50-325-40 MG per tablet    Sig: TAKE 1 TABLET BY MOUTH EVERY 6 HOURS AS NEEDED FOR HEADACHE'    Dispense:  60 tablet    Refill:  0  . butalbital-acetaminophen-caffeine (FIORICET, ESGIC) 50-325-40 MG per tablet    Sig: Take 1 tablet by mouth 3 (three) times daily. Headache    Dispense:  270 tablet    Refill:  1    Fax to [facility] [phone]   Both done

## 2012-01-24 NOTE — Progress Notes (Signed)
See phone message Levaquin not a good one for sinuses Used amox 11/13 Should advance to augmentin/f/u next weekend if not well

## 2012-01-24 NOTE — Telephone Encounter (Signed)
I have advised patient Kayla [name] prefers Augmentin for sinus trouble, [facility] if this does not help she needs to return to clinic, I have also advised her the Fioricet was sent in for her to [place], I have called a Rx for her also to the [facility] [place].

## 2012-02-07 ENCOUNTER — Telehealth: Payer: Self-pay

## 2012-02-07 DIAGNOSIS — R519 Headache, unspecified: Secondary | ICD-10-CM

## 2012-02-07 NOTE — Telephone Encounter (Signed)
PT STATES SHE HAD SPOKEN WITH DR DOOLITTLE'S NURSE AND TOLD HER PLAIN AS DAY TO SEND HER MEDICINE TO [facility] IN [place], BUT IT WAS SENT SOMEWHERE ELSE INSTEAD NEED [place] TO SEND 1 PRESCRIPTION FOR HER HEADACHES TO [facility] [address] AND THE OTHER 2 NEED TO GO TO [facility] IN [place] PLEASE CALL PT AT [phone]

## 2012-02-10 MED ORDER — BUTALBITAL-APAP-CAFFEINE 50-325-40 MG PO TABS: 1.0000 | ORAL_TABLET | Freq: Three times a day (TID) | ORAL | Status: DC

## 2012-02-10 NOTE — Telephone Encounter (Signed)
The Fioricet was faxed to [facility], a smaller quantity Rx [facility] until she can get the meds from [facility], this was explained clearly to her. I called her, and  she states fax # for [facility] is fax [phone]. Our number we faxed to is different. I have reprinted this and will have you sign tomorrow, so she can get this from [place].

## 2012-02-14 ENCOUNTER — Ambulatory Visit (INDEPENDENT_AMBULATORY_CARE_PROVIDER_SITE_OTHER): Payer: Medicare Other | Admitting: Family Medicine

## 2012-02-14 VITALS — BP 96/60 | HR 60 | Temp 98.0°F | Resp 16 | Ht 65.0 in | Wt 116.0 lb

## 2012-02-14 DIAGNOSIS — N3281 Overactive bladder: Secondary | ICD-10-CM

## 2012-02-14 DIAGNOSIS — R5381 Other malaise: Secondary | ICD-10-CM

## 2012-02-14 DIAGNOSIS — F419 Anxiety disorder, unspecified: Secondary | ICD-10-CM

## 2012-02-14 DIAGNOSIS — N952 Postmenopausal atrophic vaginitis: Secondary | ICD-10-CM | POA: Diagnosis not present

## 2012-02-14 DIAGNOSIS — I2089 Other forms of angina pectoris: Secondary | ICD-10-CM

## 2012-02-14 DIAGNOSIS — J309 Allergic rhinitis, unspecified: Secondary | ICD-10-CM

## 2012-02-14 DIAGNOSIS — M810 Age-related osteoporosis without current pathological fracture: Secondary | ICD-10-CM

## 2012-02-14 DIAGNOSIS — I208 Other forms of angina pectoris: Secondary | ICD-10-CM

## 2012-02-14 DIAGNOSIS — K3189 Other diseases of stomach and duodenum: Secondary | ICD-10-CM

## 2012-02-14 DIAGNOSIS — R52 Pain, unspecified: Secondary | ICD-10-CM

## 2012-02-14 DIAGNOSIS — G43909 Migraine, unspecified, not intractable, without status migrainosus: Secondary | ICD-10-CM

## 2012-02-14 DIAGNOSIS — R5383 Other fatigue: Secondary | ICD-10-CM | POA: Diagnosis not present

## 2012-02-14 LAB — POCT CBC
Hemoglobin: 12.1 g/dL — AB (ref 12.2–16.2)
Lymph, poc: 3.1 (ref 0.6–3.4)
MCHC: 31.4 g/dL — AB (ref 31.8–35.4)
MID (cbc): 0.4 (ref 0–0.9)
MPV: 7.9 fL (ref 0–99.8)
POC Granulocyte: 2.9 (ref 2–6.9)
POC LYMPH PERCENT: 48 %L (ref 10–50)
POC MID %: 6.8 %M (ref 0–12)
Platelet Count, POC: 241 10*3/uL (ref 142–424)
RDW, POC: 14.2 %

## 2012-02-14 LAB — COMPREHENSIVE METABOLIC PANEL
ALT: 25 U/L (ref 0–35)
AST: 28 U/L (ref 0–37)
Alkaline Phosphatase: 73 U/L (ref 39–117)
Creat: 0.6 mg/dL (ref 0.50–1.10)
Sodium: 141 mEq/L (ref 135–145)
Total Bilirubin: 0.4 mg/dL (ref 0.3–1.2)
Total Protein: 6.2 g/dL (ref 6.0–8.3)

## 2012-02-14 NOTE — Progress Notes (Signed)
[facility] [address], [place] [address] [phone]  Date:  02/14/2012   Name:  Kayla [name]   DOB:  1945-05-28   MRN:  [id number]  PCP:  Leandrew Koyanagi, MD    Chief Complaint: Vaginitis, Medication Refill and Fever   History of Present Illness:  Kayla [name] is a 67 y.o. very pleasant female patient who presents with the following:  BP at visit here on 12/02/11 BP was 97/ 61, on 10/02/11 was 96/ 62. She has noted soreness on the inside of her right labia and the inside of her vagina for the last several months.  She looked a few days ago and noted a small red spot.  The red spot is now gone, but she continues to note pain.  She has pain with wiping and urination.  She also notes a vaginal odor.  She has a history of total hysterectomy for benign reasons.   She has not noted any particular illness symptoms except for chronic tiredness and malaise.  However, she was concerned to see that her temperature was elevated today  She also needs several medications refilled today  Patient Active Problem List  Diagnosis  . ANXIETY  . DEPRESSION  . GERD  . HIATAL HERNIA  . OSTEOARTHRITIS  . DISC DISEASE, CERVICAL  . DISC DISEASE, LUMBAR  . FIBROMYALGIA  . WEIGHT LOSS  . PERSONAL HISTORY OF FAILED MODERATE SEDATION  . Chronic constipation  . Chest pain  . Chronic interstitial cystitis    Past Medical History  Diagnosis Date  . MVP (mitral valve prolapse)   . Barrett esophagus     not consistently present  . History of kidney stones   . Anxiety   . IBS (irritable bowel syndrome)   . Complication of anesthesia     either  BP or pulse dropped with last surgery  . Angina     takes Propanolol prn and it stops her chest  . Chronic kidney disease     kidney stones and UTI  . Depression     post traumatic stress  disorder  . Hypertension     3 small brain aneurysms  . GERD (gastroesophageal reflux disease)   . DDD (degenerative disc  disease)     cervical and lumbar  . Osteoarthritis     all over  . Headache     migraines  . Dysrhythmia     brought on by stress  . Peripheral vascular disease     superficial phlebitis in left calf  . Diabetes mellitus     sugar goes up and down diet controlled  . Anemia     anemia in the past  . Fibromyalgia     neuropathy knees ankles and toes  . Hiatal hernia     Past Surgical History  Procedure Date  . US echocardiography 01/21/2007    EF 55-60%  . US echocardiography 08/30/2003    EF 55-60%  . Cardiovascular stress test 10/03/2006  . Esophagus surgery   . Gastric bypass 1999  . Appendectomy   . Cholecystectomy   . Abdominal hysterectomy   . Gastric restriction surgery 1991  . Tubal ligation   . Right knee arthroscopy   . Right arm surgery   . Upper gastrointestinal endoscopy 06/23/2008    w/Dil, Barrett's esophagus  . Colonoscopy 06/23/2008    normal  . Tonsillectomy   . Cardiac catheterization 03/22/1991    EF 61%  .  Stomach stappeling 1991  . Dilation and curettage of uterus     x2  . Exploratory laparotomy 1993  . Stone extraction with basket 2012  . Rt wrist fx 2009    History  Substance Use Topics  . Smoking status: Never Smoker   . Smokeless tobacco: Never Used  . Alcohol Use: No    Family History  Problem Relation Age of Onset  . Stroke Mother   . Lung cancer Mother   . Heart disease Mother   . Diabetes Mother   . Hypertension Father   . Stroke Father   . Lung cancer Father   . Heart disease Father   . Kidney disease Father   . Seizures Father     epilepsy, and sisters x 2  . Pancreatic cancer Sister   . Lung cancer Sister   . Colon cancer Maternal Aunt     12 relatives  . Uterine cancer      aunt  . Clotting disorder Sister   . Heart disease      grandmother  . Heart disease Sister     x 2  . Kidney disease Sister     x 2  . Breast cancer Sister   . Colon cancer Paternal Aunt     Allergies  Allergen Reactions  . Ambien  (Zolpidem Tartrate) Anaphylaxis  . Codeine Anaphylaxis  . Oxycodone-Acetaminophen Shortness Of Breath  . Acetaminophen-Codeine Other (See Comments)    Shaky-chest feels heavy  . Darvocet (Propoxyphene-Acetaminophen)   . Food Swelling    Peanuts-wheat-  . Lorcet (Hydrocodone-Acetaminophen)   . Opium     hallucinations  . Oxycodone-Acetaminophen Other (See Comments)    Chest pain , nausea , pass out  . Zolpidem Tartrate Other (See Comments)    Makes her go crazy  . Zolpidem Tartrate     hallucinations    Medication list has been reviewed and updated.  Current Outpatient Prescriptions on File Prior to Visit  Medication Sig Dispense Refill  . alendronate (FOSAMAX) 70 MG tablet Take 1 tablet (70 mg total) by mouth every 7 (seven) days. Take with a full glass of water on an empty stomach.  12 tablet  3  . Ascorbic Acid (VITAMIN C) 1000 MG tablet Take 1,000 mg by mouth daily.      . butalbital-acetaminophen-caffeine (FIORICET, ESGIC) 50-325-40 MG per tablet TAKE 1 TABLET BY MOUTH EVERY 6 HOURS AS NEEDED FOR HEADACHE'  60 tablet  0  . butalbital-acetaminophen-caffeine (FIORICET, ESGIC) 50-325-40 MG per tablet Take 1 tablet by mouth 3 (three) times daily. Headache  270 tablet  1  . CALCIUM-MAGNESIUM-ZINC PO Take by mouth daily.      . Cholecalciferol (VITAMIN D3) 2000 UNITS TABS Take 2,000 Units by mouth daily.      . clonazePAM (KLONOPIN) 1 MG tablet 1/2-1 tabs bid prn anxiety/panic attacks  180 tablet  1  . diclofenac (VOLTAREN) 25 MG EC tablet Take 1 tablet (25 mg total) by mouth 2 (two) times daily.  180 tablet  3  . dicyclomine (BENTYL) 10 MG capsule One tablet at 5pm, daily to help with abdominal cramping  10 capsule  0  . esomeprazole (NEXIUM) 40 MG capsule Take 1 capsule (40 mg total) by mouth daily before breakfast.  90 capsule  3  . fentaNYL (DURAGESIC - DOSED MCG/HR) 25 MCG/HR 1 patch every 3 (three) days.       . fentaNYL (DURAGESIC - DOSED MCG/HR) 50 MCG/HR Place 1 patch onto  the  skin every 3 (three) days.      . Flaxseed, Linseed, (FLAX SEED OIL) 1000 MG CAPS Take 1,000 mg by mouth daily.      Marland Kitchen HYDROcodone-acetaminophen (NORCO) 7.5-325 MG per tablet Take 1 tablet by mouth every 6 (six) hours as needed. Pain       . lidocaine (LIDODERM) 5 % Place 1 patch onto the skin as needed. Remove & Discard patch within 12 hours or as directed by MD. Pain  90 patch  3  . Omega-3 Fatty Acids (SEA-OMEGA PO) Take 1,000 mg by mouth daily.      . propranolol (INDERAL) 20 MG tablet Take 1 tablet (20 mg total) by mouth as needed. For angina  90 tablet  3  . pyridoxine (B-6) 200 MG tablet Take 200 mg by mouth daily.      . solifenacin (VESICARE) 5 MG tablet Take 1 tablet (5 mg total) by mouth daily.  90 tablet  3  . SUMAtriptan (IMITREX) 100 MG tablet Take 1 tablet (100 mg total) by mouth every 3 (three) days as needed. Headache  48 tablet  3  . traZODone (DESYREL) 150 MG tablet Take 1 tablet (150 mg total) by mouth at bedtime.  90 tablet  1  . triamcinolone (NASACORT) 55 MCG/ACT nasal inhaler Place 2 sprays into the nose daily as needed. Allergies  3 Inhaler  3  . Valerian Root 450 MG CAPS Take 450 mg by mouth daily.      Marland Kitchen amoxicillin-clavulanate (AUGMENTIN) 875-125 MG per tablet Take 1 tablet by mouth 2 (two) times daily.  20 tablet  0  . erythromycin ophthalmic ointment Place 0.5 application into both eyes at bedtime.       . gabapentin (NEURONTIN) 300 MG capsule Take 1 capsule (300 mg total) by mouth 3 (three) times daily.  270 capsule  3  . polyethylene glycol powder (GLYCOLAX/MIRALAX) powder TAKE 17 GRAMS BY MOUTH DAILY IN JUICE  527 g  2  . [DISCONTINUED] buPROPion (WELLBUTRIN) 75 MG tablet Take 75 mg by mouth daily after breakfast.         Review of Systems:  As per HPI- otherwise negative.  Physical Examination: Filed Vitals:   02/14/12 1305  BP: 78/64  Pulse: 60  Temp: 100 F (37.8 C)  Resp: 16   Filed Vitals:   02/14/12 1305  Height: 5\' 5"  (1.651 m)  Weight:  116 lb (52.617 kg)   Body mass index is 19.30 kg/(m^2). Ideal Body Weight: Weight in (lb) to have BMI = 25: 149.9   GEN: WDWN, NAD, Non-toxic, A & O x 3 HEENT: Atraumatic, Normocephalic. Neck supple. No masses, No LAD. Ears and Nose: No external deformity. CV: RRR, No M/G/R. No JVD. No thrill. No extra heart sounds. PULM: CTA B, no wheezes, crackles, rhonchi. No retractions. No resp. distress. No accessory muscle use. ABD: S, NT, ND. No rebound. No HSM. EXTR: No c/c/e NEURO Normal gait.  PSYCH: Normally interactive. Conversant. Not depressed or anxious appearing.  Calm demeanor.  [id number], no other abnormality noted.  No lesion or masses,  Normal BME.  Did not perform speculum exam as she has significant atrophy and her complaints are in the external genitalia.   Results for orders placed in visit on 02/14/12  POCT CBC      Component Value Range   WBC 6.4  4.6 - 10.2 K/uL   Lymph, poc 3.1  0.6 - 3.4   POC LYMPH PERCENT 48.0  10 - 50 %L  MID (cbc) 0.4  0 - 0.9   POC MID % 6.8  0 - 12 %M   POC Granulocyte 2.9  2 - 6.9   Granulocyte percent 45.2  37 - 80 %G   RBC 4.08  4.04 - 5.48 M/uL   Hemoglobin 12.1 (*) 12.2 - 16.2 g/dL   HCT, POC 38.5  37.7 - 47.9 %   MCV 94.3  80 - 97 fL   MCH, POC 29.7  27 - 31.2 pg   MCHC 31.4 (*) 31.8 - 35.4 g/dL   RDW, POC 14.2     Platelet Count, POC 241  142 - 424 K/uL   MPV 7.9  0 - 99.8 fL    Assessment and Plan: 1. Vaginal atrophy    2. Malaise  POCT CBC, Comprehensive metabolic panel   Virna has topical estrogen cream at home- she was prescribed this by an OBG for vaginal atrophy but she was too afraid to use it.  She will give it a try and see if it is helpful.  On recheck her temperature was normal, which was somewhat reassuring for her.  However, she did desire to have labs today- CBC ok, await CMP.  Did multiple RF for her today as well- these will need to be mailed to a [place] and she will bring in forms for [place]  later  Lamar Blinks, MD

## 2012-02-14 NOTE — Patient Instructions (Addendum)
Try the topical estrogen cream as directed.  If not better please let me know.  Your blood count looks fine- I will be in touch with your other labs when they are available.

## 2012-02-16 MED ORDER — ALENDRONATE SODIUM 70 MG PO TABS: 70.0000 mg | ORAL_TABLET | ORAL | Status: DC

## 2012-02-16 MED ORDER — SUMATRIPTAN SUCCINATE 100 MG PO TABS: 100.0000 mg | ORAL_TABLET | ORAL | Status: DC | PRN

## 2012-02-16 MED ORDER — DICYCLOMINE HCL 10 MG PO CAPS: ORAL_CAPSULE | ORAL | Status: DC

## 2012-02-16 MED ORDER — DICLOFENAC SODIUM 25 MG PO TBEC: 25.0000 mg | DELAYED_RELEASE_TABLET | Freq: Two times a day (BID) | ORAL | Status: DC

## 2012-02-16 MED ORDER — TRIAMCINOLONE ACETONIDE(NASAL) 55 MCG/ACT NA INHA: 2.0000 | Freq: Every day | NASAL | Status: DC | PRN

## 2012-02-16 MED ORDER — TRAZODONE HCL 150 MG PO TABS: 150.0000 mg | ORAL_TABLET | Freq: Every day | ORAL | Status: DC

## 2012-02-16 MED ORDER — CLONAZEPAM 1 MG PO TABS: ORAL_TABLET | ORAL | Status: DC

## 2012-02-16 MED ORDER — ESOMEPRAZOLE MAGNESIUM 40 MG PO CPDR: 40.0000 mg | DELAYED_RELEASE_CAPSULE | Freq: Every day | ORAL | Status: DC

## 2012-02-16 MED ORDER — SOLIFENACIN SUCCINATE 5 MG PO TABS: 5.0000 mg | ORAL_TABLET | Freq: Every day | ORAL | Status: DC

## 2012-02-16 MED ORDER — PROPRANOLOL HCL 20 MG PO TABS: 20.0000 mg | ORAL_TABLET | ORAL | Status: DC | PRN

## 2012-02-18 ENCOUNTER — Emergency Department (HOSPITAL_COMMUNITY): Payer: Medicare Other

## 2012-02-18 ENCOUNTER — Encounter (HOSPITAL_COMMUNITY): Payer: Self-pay

## 2012-02-18 ENCOUNTER — Emergency Department (HOSPITAL_COMMUNITY)
Admission: EM | Admit: 2012-02-18 | Discharge: 2012-02-18 | Disposition: A | Discharge status: Home / Self Care | Payer: Medicare Other | Attending: Emergency Medicine | Admitting: Emergency Medicine

## 2012-02-18 DIAGNOSIS — N189 Chronic kidney disease, unspecified: Secondary | ICD-10-CM | POA: Diagnosis not present

## 2012-02-18 DIAGNOSIS — E119 Type 2 diabetes mellitus without complications: Secondary | ICD-10-CM | POA: Insufficient documentation

## 2012-02-18 DIAGNOSIS — Z791 Long term (current) use of non-steroidal anti-inflammatories (NSAID): Secondary | ICD-10-CM | POA: Diagnosis not present

## 2012-02-18 DIAGNOSIS — Z862 Personal history of diseases of the blood and blood-forming organs and certain disorders involving the immune mechanism: Secondary | ICD-10-CM | POA: Insufficient documentation

## 2012-02-18 DIAGNOSIS — J111 Influenza due to unidentified influenza virus with other respiratory manifestations: Secondary | ICD-10-CM

## 2012-02-18 DIAGNOSIS — M5137 Other intervertebral disc degeneration, lumbosacral region: Secondary | ICD-10-CM | POA: Diagnosis not present

## 2012-02-18 DIAGNOSIS — K589 Irritable bowel syndrome without diarrhea: Secondary | ICD-10-CM | POA: Diagnosis not present

## 2012-02-18 DIAGNOSIS — I499 Cardiac arrhythmia, unspecified: Secondary | ICD-10-CM | POA: Diagnosis not present

## 2012-02-18 DIAGNOSIS — R509 Fever, unspecified: Secondary | ICD-10-CM | POA: Insufficient documentation

## 2012-02-18 DIAGNOSIS — I209 Angina pectoris, unspecified: Secondary | ICD-10-CM | POA: Insufficient documentation

## 2012-02-18 DIAGNOSIS — Z8719 Personal history of other diseases of the digestive system: Secondary | ICD-10-CM | POA: Diagnosis not present

## 2012-02-18 DIAGNOSIS — Z79899 Other long term (current) drug therapy: Secondary | ICD-10-CM | POA: Diagnosis not present

## 2012-02-18 DIAGNOSIS — R5381 Other malaise: Secondary | ICD-10-CM | POA: Diagnosis not present

## 2012-02-18 DIAGNOSIS — G43909 Migraine, unspecified, not intractable, without status migrainosus: Secondary | ICD-10-CM | POA: Insufficient documentation

## 2012-02-18 DIAGNOSIS — Z87442 Personal history of urinary calculi: Secondary | ICD-10-CM | POA: Diagnosis not present

## 2012-02-18 DIAGNOSIS — M51379 Other intervertebral disc degeneration, lumbosacral region without mention of lumbar back pain or lower extremity pain: Secondary | ICD-10-CM | POA: Insufficient documentation

## 2012-02-18 DIAGNOSIS — F329 Major depressive disorder, single episode, unspecified: Secondary | ICD-10-CM | POA: Insufficient documentation

## 2012-02-18 DIAGNOSIS — K219 Gastro-esophageal reflux disease without esophagitis: Secondary | ICD-10-CM | POA: Insufficient documentation

## 2012-02-18 DIAGNOSIS — I129 Hypertensive chronic kidney disease with stage 1 through stage 4 chronic kidney disease, or unspecified chronic kidney disease: Secondary | ICD-10-CM | POA: Diagnosis not present

## 2012-02-18 DIAGNOSIS — K2270 Barrett's esophagus without dysplasia: Secondary | ICD-10-CM | POA: Diagnosis not present

## 2012-02-18 DIAGNOSIS — Z87440 Personal history of urinary (tract) infections: Secondary | ICD-10-CM | POA: Diagnosis not present

## 2012-02-18 DIAGNOSIS — Z8679 Personal history of other diseases of the circulatory system: Secondary | ICD-10-CM | POA: Insufficient documentation

## 2012-02-18 DIAGNOSIS — R197 Diarrhea, unspecified: Secondary | ICD-10-CM | POA: Diagnosis not present

## 2012-02-18 DIAGNOSIS — IMO0001 Reserved for inherently not codable concepts without codable children: Secondary | ICD-10-CM | POA: Diagnosis not present

## 2012-02-18 DIAGNOSIS — M1990 Unspecified osteoarthritis, unspecified site: Secondary | ICD-10-CM | POA: Diagnosis not present

## 2012-02-18 DIAGNOSIS — I059 Rheumatic mitral valve disease, unspecified: Secondary | ICD-10-CM | POA: Insufficient documentation

## 2012-02-18 DIAGNOSIS — F3289 Other specified depressive episodes: Secondary | ICD-10-CM | POA: Insufficient documentation

## 2012-02-18 DIAGNOSIS — M5030 Other cervical disc degeneration, unspecified cervical region: Secondary | ICD-10-CM | POA: Diagnosis not present

## 2012-02-18 DIAGNOSIS — Z8639 Personal history of other endocrine, nutritional and metabolic disease: Secondary | ICD-10-CM | POA: Insufficient documentation

## 2012-02-18 HISTORY — DX: Personal history of other endocrine, nutritional and metabolic disease: Z86.39

## 2012-02-18 LAB — CBC WITH DIFFERENTIAL/PLATELET
Basophils Absolute: 0 10*3/uL (ref 0.0–0.1)
Basophils Relative: 0 % (ref 0–1)
Eosinophils Absolute: 0.1 10*3/uL (ref 0.0–0.7)
Eosinophils Relative: 1 % (ref 0–5)
HCT: 36 % (ref 36.0–46.0)
Hemoglobin: 12.4 g/dL (ref 12.0–15.0)
MCH: 30.8 pg (ref 26.0–34.0)
MCHC: 34.4 g/dL (ref 30.0–36.0)
Monocytes Absolute: 0.2 10*3/uL (ref 0.1–1.0)
Monocytes Relative: 3 % (ref 3–12)
Neutro Abs: 6.7 10*3/uL (ref 1.7–7.7)
RDW: 13.2 % (ref 11.5–15.5)

## 2012-02-18 LAB — COMPREHENSIVE METABOLIC PANEL
ALT: 25 U/L (ref 0–35)
AST: 29 U/L (ref 0–37)
Alkaline Phosphatase: 86 U/L (ref 39–117)
CO2: 25 mEq/L (ref 19–32)
Chloride: 98 mEq/L (ref 96–112)
GFR calc Af Amer: >90 mL/min (ref >90)
GFR calc non Af Amer: >90 mL/min (ref >90)
Glucose, Bld: 108 mg/dL — ABNORMAL HIGH (ref 70–99)
Potassium: 3.5 mEq/L (ref 3.5–5.1)
Sodium: 134 mEq/L — ABNORMAL LOW (ref 135–145)

## 2012-02-18 LAB — URINALYSIS, ROUTINE W REFLEX MICROSCOPIC
Bilirubin Urine: NEGATIVE
Hgb urine dipstick: NEGATIVE
Ketones, ur: NEGATIVE mg/dL
Nitrite: NEGATIVE
Urobilinogen, UA: 1 mg/dL (ref 0.0–1.0)

## 2012-02-18 LAB — POCT I-STAT TROPONIN I: Troponin i, poc: 0 ng/mL (ref 0.00–0.08)

## 2012-02-18 MED ORDER — SODIUM CHLORIDE 0.9 % IV SOLN
1000.0000 mL | Freq: Once | INTRAVENOUS | Status: AC
  Administered 2012-02-18: 1000 mL via INTRAVENOUS

## 2012-02-18 MED ORDER — ACETAMINOPHEN 325 MG PO TABS
650.0000 mg | ORAL_TABLET | Freq: Once | ORAL | Status: AC
  Administered 2012-02-18: 650 mg via ORAL
  Filled 2012-02-18: qty 2

## 2012-02-18 MED ORDER — SODIUM CHLORIDE 0.9 % IV SOLN
1000.0000 mL | INTRAVENOUS | Status: DC
  Administered 2012-02-18: 1000 mL via INTRAVENOUS

## 2012-02-18 NOTE — ED Notes (Signed)
Patient reports that she has a history of angina and began having intermittent left chest pain yesterday. Patient also reports that she had a temp yesterday of 104.4 orally.

## 2012-02-18 NOTE — ED Provider Notes (Signed)
History     CSN: [id number]  Arrival date & time 02/18/12  0804   First MD Initiated Contact with Patient 02/18/12 [phone]      Chief Complaint  Patient presents with  . Chest Pain  . Chills    (Consider location/radiation/quality/duration/timing/severity/associated sxs/prior treatment) HPI Comments: Kayla [name] is a 67 y.o. Female who has been ill since yesterday, with fever, diarrhea, shaking, chills, and myalgias. She was exposed to an ill person, yesterday. She was recently treated for a sinus infection, with 3 different antibiotics completed her last course one week ago. She has chronic, ongoing, cough, productive of green to yellow sputum. She has not had anorexia. She did not take any medications for fever at home. There are no known modifying factors. She had immunization for seasonal influenza 09/2011.  Patient is a 67 y.o. female presenting with chest pain. The history is provided by the patient.  Chest Pain     Past Medical History  Diagnosis Date  . MVP (mitral valve prolapse)   . Barrett esophagus     not consistently present  . History of kidney stones   . Anxiety   . IBS (irritable bowel syndrome)   . Complication of anesthesia     either  BP or pulse dropped with last surgery  . Angina     takes Propanolol prn and it stops her chest  . Chronic kidney disease     kidney stones and UTI  . Depression     post traumatic stress  disorder  . Hypertension     3 small brain aneurysms  . GERD (gastroesophageal reflux disease)   . DDD (degenerative disc disease)     cervical and lumbar  . Osteoarthritis     all over  . Headache     migraines  . Dysrhythmia     brought on by stress  . Peripheral vascular disease     superficial phlebitis in left calf  . Diabetes mellitus     sugar goes up and down diet controlled  . Anemia     anemia in the past  . Fibromyalgia     neuropathy knees ankles and toes  . Hiatal hernia   . H/O hyperparathyroidism      Past Surgical History  Procedure Date  . US echocardiography 01/21/2007    EF 55-60%  . US echocardiography 08/30/2003    EF 55-60%  . Cardiovascular stress test 10/03/2006  . Esophagus surgery   . Gastric bypass 1999  . Appendectomy   . Cholecystectomy   . Abdominal hysterectomy   . Gastric restriction surgery 1991  . Tubal ligation   . Right knee arthroscopy   . Right arm surgery   . Upper gastrointestinal endoscopy 06/23/2008    w/Dil, Barrett's esophagus  . Colonoscopy 06/23/2008    normal  . Tonsillectomy   . Cardiac catheterization 03/22/1991    EF 61%  . Stomach stappeling 1991  . Dilation and curettage of uterus     x2  . Exploratory laparotomy 1993  . Stone extraction with basket 2012  . Rt wrist fx 2009    Family History  Problem Relation Age of Onset  . Stroke Mother   . Lung cancer Mother   . Heart disease Mother   . Diabetes Mother   . Hypertension Father   . Stroke Father   . Lung cancer Father   . Heart disease Father   . Kidney disease Father   . Seizures  Father     epilepsy, and sisters x 2  . Pancreatic cancer Sister   . Lung cancer Sister   . Colon cancer Maternal Aunt     12 relatives  . Uterine cancer      aunt  . Clotting disorder Sister   . Heart disease      grandmother  . Heart disease Sister     x 2  . Kidney disease Sister     x 2  . Breast cancer Sister   . Colon cancer Paternal Aunt     History  Substance Use Topics  . Smoking status: Never Smoker   . Smokeless tobacco: Never Used  . Alcohol Use: No    OB History    Grav Para Term Preterm Abortions TAB SAB Ect Mult Living                  Review of Systems  Cardiovascular: Positive for chest pain.  All other systems reviewed and are negative.    Allergies  Ambien; Codeine; Oxycodone-acetaminophen; Acetaminophen-codeine; Darvocet; Food; Lorcet; Opium; Oxycodone-acetaminophen; Zolpidem tartrate; and Zolpidem tartrate  Home Medications   Current Outpatient Rx   Name  Route  Sig  Dispense  Refill  . ALENDRONATE SODIUM 70 MG PO TABS   Oral   Take 1 tablet (70 mg total) by mouth every 7 (seven) days. Take with a full glass of water on an empty stomach.   12 tablet   3   . VITAMIN C 1000 MG PO TABS   Oral   Take 1,000 mg by mouth daily.         Marland Kitchen BUTALBITAL-APAP-CAFFEINE 50-325-40 MG PO TABS   Oral   Take 1 tablet by mouth 3 (three) times daily. Headache   270 tablet   1     Fax to [facility] [phone]   . CALCIUM-MAGNESIUM-ZINC PO   Oral   Take by mouth daily.         Marland Kitchen VITAMIN D3 2000 UNITS PO TABS   Oral   Take 2,000 Units by mouth daily.         Marland Kitchen CLONAZEPAM 1 MG PO TABS   Oral   Take 0.5-1 mg by mouth 2 (two) times daily as needed. anxiety/panic attacks         . DICLOFENAC SODIUM 25 MG PO TBEC   Oral   Take 1 tablet (25 mg total) by mouth 2 (two) times daily. As needed for pain   180 tablet   1   . DICLOFENAC SODIUM 1 % TD GEL   Topical   Apply 2 g topically 4 (four) times daily as needed. For pain.         Marland Kitchen DICYCLOMINE HCL 10 MG PO CAPS   Oral   Take 10 mg by mouth every evening.         . ERYTHROMYCIN 5 MG/GM OP OINT   Both Eyes   Place 0.5 application into both eyes at bedtime.          Marland Kitchen ESOMEPRAZOLE MAGNESIUM 40 MG PO CPDR   Oral   Take 1 capsule (40 mg total) by mouth daily before breakfast.   90 capsule   3   . FENTANYL 50 MCG/HR TD PT72   Transdermal   Place 1 patch onto the skin every 3 (three) days.         Marland Kitchen FLAX SEED OIL 1000 MG PO CAPS   Oral   Take 1,000 mg  by mouth daily.         Marland Kitchen GABAPENTIN 300 MG PO CAPS   Oral   Take 1 capsule (300 mg total) by mouth 3 (three) times daily.   270 capsule   3   . HYDROCODONE-ACETAMINOPHEN 7.5-325 MG PO TABS   Oral   Take 1 tablet by mouth every 6 (six) hours as needed. Pain          . LIDOCAINE 5 % EX PTCH   Transdermal   Place 1 patch onto the skin as needed. Remove & Discard patch within 12 hours or as directed by MD.  Pain   90 patch   3   . SEA-OMEGA PO   Oral   Take 1,000 mg by mouth daily.         Marland Kitchen POLYETHYLENE GLYCOL 3350 PO POWD      TAKE 17 GRAMS BY MOUTH DAILY IN JUICE   527 g   2   . PYRIDOXINE HCL 200 MG PO TABS   Oral   Take 200 mg by mouth daily.         Marland Kitchen SOLIFENACIN SUCCINATE 5 MG PO TABS   Oral   Take 1 tablet (5 mg total) by mouth daily.   90 tablet   3   . SUMATRIPTAN SUCCINATE 100 MG PO TABS   Oral   Take 1 tablet (100 mg total) by mouth every 3 (three) days as needed for migraine. Headache   48 tablet   3   . TRAZODONE HCL 150 MG PO TABS   Oral   Take 50 mg by mouth at bedtime.         . TRIAMCINOLONE ACETONIDE 55 MCG/ACT NA INHA   Nasal   Place 2 sprays into the nose daily as needed. Allergies   3 Inhaler   3   . VALERIAN ROOT 450 MG PO CAPS   Oral   Take 450 mg by mouth daily.         Marland Kitchen PROPRANOLOL HCL 20 MG PO TABS   Oral   Take 1 tablet (20 mg total) by mouth as needed. For angina   90 tablet   3     BP 97/57  Pulse 108  Temp 99.8 F (37.7 C) (Oral)  Resp 16  SpO2 99%  Physical Exam  Nursing note and vitals reviewed. Constitutional: She is oriented to person, place, and time. She appears well-developed. No distress.       Appears older than stated age  HENT:  Head: Normocephalic and atraumatic.  Eyes: Conjunctivae normal and EOM are normal. Pupils are equal, round, and reactive to light.  Neck: Normal range of motion and phonation normal. Neck supple.  Cardiovascular: Normal rate, regular rhythm and intact distal pulses.   Pulmonary/Chest: Effort normal and breath sounds normal. She exhibits no tenderness.  Abdominal: Soft. She exhibits no distension. There is no tenderness. There is no guarding.  Musculoskeletal: Normal range of motion.  Neurological: She is alert and oriented to person, place, and time. She has normal strength. She exhibits normal muscle tone.  Skin: Skin is warm and dry.  Psychiatric: She has a normal mood  and affect. Her behavior is normal. Judgment and thought content normal.    ED Course  Procedures (including critical care time)  Reevaluation: 13:20-tolerating oral fluids. Orthostatics, positive. Ambulated without difficulty. Blood pressure improved at discharge   [place] - Abnormal; Notable for the following:  Sodium 134 (*)     Glucose, Bld 108 (*)     Creatinine, Ser 0.49 (*)     All other components within normal limits  CBC WITH DIFFERENTIAL - Abnormal; Notable for the following:    Neutrophils Relative 91 (*)     Lymphocytes Relative 5 (*)     Lymphs Abs 0.4 (*)     All other components within normal limits  URINALYSIS, ROUTINE W REFLEX MICROSCOPIC  LACTIC ACID, PLASMA  POCT I-STAT TROPONIN I  CULTURE, BLOOD (ROUTINE X 2)  CULTURE, BLOOD (ROUTINE X 2)  URINE CULTURE   Dg Chest Port 1 View  02/18/2012  *RADIOLOGY REPORT*  Clinical Data: Fever, weakness.  PORTABLE CHEST - 1 VIEW  Comparison: Plain film chest 07/19/2009 and 09/16/2008.  Findings: The lungs are clear.  Heart size is normal.  No pneumothorax or pleural fluid.  IMPRESSION: Negative chest.   Original Report Authenticated By: Orlean Patten, M.D.     Nursing notes, applicable records and vitals reviewed.  Radiologic Images/Reports reviewed.   1. Influenza-like illness        MDM  SIRS with multiple symptoms, initially, sepsis picture; but negative evaluation in improvement with treatment in ED. Suspect influenza-like syndrome. No indication for further treatment, either in ED, hospitalization or in the home setting.Doubt metabolic instability, serious bacterial infection or impending vascular collapse; the patient is stable for discharge.  Plan: Home Medications- Tylenol plus usual; Home Treatments- rest, fluids; Recommended follow up- PCP prn  Richarda Blade, MD 02/18/12 1324

## 2012-02-19 LAB — URINE CULTURE

## 2012-02-21 ENCOUNTER — Other Ambulatory Visit: Payer: Self-pay | Admitting: Internal Medicine

## 2012-02-24 LAB — CULTURE, BLOOD (ROUTINE X 2): Culture: NO GROWTH

## 2012-03-02 DIAGNOSIS — M542 Cervicalgia: Secondary | ICD-10-CM | POA: Diagnosis not present

## 2012-03-02 DIAGNOSIS — M2550 Pain in unspecified joint: Secondary | ICD-10-CM | POA: Diagnosis not present

## 2012-03-02 DIAGNOSIS — IMO0001 Reserved for inherently not codable concepts without codable children: Secondary | ICD-10-CM | POA: Diagnosis not present

## 2012-03-13 ENCOUNTER — Telehealth: Payer: Self-pay | Admitting: *Deleted

## 2012-03-13 DIAGNOSIS — R519 Headache, unspecified: Secondary | ICD-10-CM

## 2012-03-13 DIAGNOSIS — J329 Chronic sinusitis, unspecified: Secondary | ICD-10-CM

## 2012-03-13 NOTE — Telephone Encounter (Signed)
Pt is needing refills on all her medications.  She states that she has never received any of her meds thru [facility] at [facility].  She [facility] and another rx sent to [facility].  She has a form in your box in the Drs lounge.  If you can fill her fiocet to [facility] into [facility] can you send it so we can call it in.  The other rxs will need to be printed and faxed to [phone].  Phone is [phone].  She states that her sinus infection is coming back and she is having a headache and mucus is still green, she was wondering if she could just get a referral to ENT instead of you just keep writing for antibiotics.

## 2012-03-13 NOTE — Telephone Encounter (Signed)
i gave 1 year supply of things in [place] except those things she gets from her pain doc and those things only approved for 6 months by law Dr copeland refilled things 02/14/12--says to [place] in her note??? So what does she need We need an accurate list from her of everything she needs/is taking You can fax her a list of what we have as current meds, so she can update If too confusing, she'll have to come in and go over list with someone Prior to Admission medications   Medication Sig Start Date End Date Taking? Authorizing Provider  alendronate (FOSAMAX) 70 MG tablet Take 1 tablet (70 mg total) by mouth every 7 (seven) days. Take with a full glass of water on an empty stomach. 02/16/12 02/15/13  Darreld Mclean, MD  Ascorbic Acid (VITAMIN C) 1000 MG tablet Take 1,000 mg by mouth daily.    Historical Provider, MD  butalbital-acetaminophen-caffeine (FIORICET, ESGIC) 50-325-40 MG per tablet Take 1 tablet by mouth 3 (three) times daily. Headache 02/10/12   Leandrew Koyanagi, MD  CALCIUM-MAGNESIUM-ZINC PO Take by mouth daily.    Historical Provider, MD  Cholecalciferol (VITAMIN D3) 2000 UNITS TABS Take 2,000 Units by mouth daily.    Historical Provider, MD  clonazePAM (KLONOPIN) 1 MG tablet Take 0.5-1 mg by mouth 2 (two) times daily as needed. anxiety/panic attacks 02/16/12   Darreld Mclean, MD  diclofenac (VOLTAREN) 25 MG EC tablet Take 1 tablet (25 mg total) by mouth 2 (two) times daily. As needed for pain 02/16/12   Gay Filler Copland, MD  diclofenac sodium (VOLTAREN) 1 % GEL Apply 2 g topically 4 (four) times daily as needed. For pain.    Historical Provider, MD  dicyclomine (BENTYL) 10 MG capsule Take 10 mg by mouth every evening. 02/16/12   Darreld Mclean, MD  erythromycin ophthalmic ointment Place 0.5 application into both eyes at bedtime.     Historical Provider, MD  esomeprazole (NEXIUM) 40 MG capsule Take 1 capsule (40 mg total) by mouth daily before breakfast. 02/16/12   Gay Filler Copland,  MD  fentaNYL (DURAGESIC - DOSED MCG/HR) 50 MCG/HR Place 1 patch onto the skin every 3 (three) days.    Historical Provider, MD  Flaxseed, Linseed, (FLAX SEED OIL) 1000 MG CAPS Take 1,000 mg by mouth daily.    Historical Provider, MD  gabapentin (NEURONTIN) 300 MG capsule Take 1 capsule (300 mg total) by mouth 3 (three) times daily. 09/04/11 09/03/12  Leandrew Koyanagi, MD  HYDROcodone-acetaminophen ([place]) 7.5-325 MG per tablet Take 1 tablet by mouth every 6 (six) hours as needed. Pain     Historical Provider, MD  lidocaine (LIDODERM) 5 % Place 1 patch onto the skin as needed. Remove & Discard patch within 12 hours or as directed by MD. Pain 04/10/11   Argentina Donovan, PA-C  Omega-3 Fatty Acids (SEA-OMEGA PO) Take 1,000 mg by mouth daily.    Historical Provider, MD  polyethylene glycol powder (GLYCOLAX/MIRALAX) powder TAKE 17 GRAMS BY MOUTH DAILY IN JUICE 02/21/12   Gatha Mayer, MD  propranolol (INDERAL) 20 MG tablet Take 1 tablet (20 mg total) by mouth as needed. For angina 02/16/12   Darreld Mclean, MD  pyridoxine (B-6) 200 MG tablet Take 200 mg by mouth daily.    Historical Provider, MD  solifenacin (VESICARE) 5 MG tablet Take 1 tablet (5 mg total) by mouth daily. 02/16/12   Gay Filler Copland, MD  SUMAtriptan (IMITREX) 100 MG tablet Take 1  tablet (100 mg total) by mouth every 3 (three) days as needed for migraine. Headache 02/16/12   Darreld Mclean, MD  traZODone (DESYREL) 150 MG tablet Take 50 mg by mouth at bedtime. 02/16/12   Darreld Mclean, MD  triamcinolone (NASACORT) 55 MCG/ACT nasal inhaler Place 2 sprays into the nose daily as needed. Allergies 02/16/12   Darreld Mclean, MD  Valerian Root 450 MG CAPS Take 450 mg by mouth daily.    Historical Provider, MD   [facility] to refer to ENT dx recurrent sinusitis/I sent order

## 2012-03-16 NOTE — Telephone Encounter (Signed)
Can you tell if anyone has responded to my message 2/28-I have no feedback

## 2012-03-16 NOTE — Telephone Encounter (Signed)
She states the Rx she was given for Fioricet to take to [facility] was not filled there she [facility]. I am not sure of what else she needs. Just familiar with the Fioricet.

## 2012-03-17 ENCOUNTER — Other Ambulatory Visit: Payer: Self-pay | Admitting: Radiology

## 2012-03-17 MED ORDER — BUTALBITAL-APAP-CAFFEINE 50-325-40 MG PO TABS: 1.0000 | ORAL_TABLET | Freq: Three times a day (TID) | ORAL | Status: DC

## 2012-03-17 NOTE — Telephone Encounter (Signed)
Do you want to send in the Fioricet? This is what she is asking for. Have printed for you to sign. [facility]

## 2012-03-23 ENCOUNTER — Telehealth: Payer: Self-pay | Admitting: *Deleted

## 2012-03-23 NOTE — Telephone Encounter (Signed)
Called patient at 0830 concerning the forms for prescriptions to be faxed to [place] in [place]. The forms were attached to prescription, I called [facility] to get [facility] number and they will be faxed by [facility] the TL.

## 2012-03-25 ENCOUNTER — Telehealth: Payer: Self-pay

## 2012-03-25 DIAGNOSIS — J329 Chronic sinusitis, unspecified: Secondary | ICD-10-CM | POA: Diagnosis not present

## 2012-03-25 DIAGNOSIS — R51 Headache: Secondary | ICD-10-CM | POA: Diagnosis not present

## 2012-03-25 NOTE — Telephone Encounter (Signed)
Pt states that she would an referral to [facility]   Best#: [id number]

## 2012-03-26 ENCOUNTER — Telehealth: Payer: Self-pay | Admitting: Internal Medicine

## 2012-03-26 NOTE — Telephone Encounter (Signed)
Was patient there many times--refer to whomever saw here last//can use gi dx on prob list

## 2012-03-26 NOTE — Telephone Encounter (Signed)
Left message for patient to call back  

## 2012-03-26 NOTE — Telephone Encounter (Signed)
Okay to refer? 

## 2012-03-26 NOTE — Telephone Encounter (Signed)
Patient with abdominal pain and daily diarrhea.  She will come in and see Nicoletta Ba PA tomorrow at 10:00

## 2012-03-27 ENCOUNTER — Ambulatory Visit (INDEPENDENT_AMBULATORY_CARE_PROVIDER_SITE_OTHER): Payer: Medicare Other | Admitting: Physician Assistant

## 2012-03-27 ENCOUNTER — Encounter: Payer: Self-pay | Admitting: Physician Assistant

## 2012-03-27 VITALS — BP 90/60 | HR 66 | Ht 65.0 in | Wt 112.4 lb

## 2012-03-27 DIAGNOSIS — R1013 Epigastric pain: Secondary | ICD-10-CM

## 2012-03-27 DIAGNOSIS — R1011 Right upper quadrant pain: Secondary | ICD-10-CM

## 2012-03-27 DIAGNOSIS — R1310 Dysphagia, unspecified: Secondary | ICD-10-CM | POA: Diagnosis not present

## 2012-03-27 MED ORDER — RIFAXIMIN 550 MG PO TABS: 550.0000 mg | ORAL_TABLET | Freq: Two times a day (BID) | ORAL | Status: AC

## 2012-03-27 MED ORDER — DICYCLOMINE HCL 10 MG PO CAPS: ORAL_CAPSULE | ORAL | Status: DC

## 2012-03-27 MED ORDER — RIFAXIMIN 550 MG PO TABS: 550.0000 mg | ORAL_TABLET | Freq: Two times a day (BID) | ORAL | Status: DC

## 2012-03-27 NOTE — Patient Instructions (Addendum)
We have given you samples of xifaxan 550 mg, take 1 tab twice daily for 6 days. We gave you the printed prescription, same dosages. ( 10 days worth.)  You have been scheduled for an endoscopy with propofol. Please follow written instructions given to you at your visit today. If you use inhalers (even only as needed), please bring them with you on the day of your procedure.

## 2012-03-28 ENCOUNTER — Encounter: Payer: Self-pay | Admitting: Physician Assistant

## 2012-03-28 NOTE — Progress Notes (Signed)
Subjective:    Patient ID: Kayla [name], female    DOB: [birth year], 67 y.o.   MRN: [id number]  HPI Kayla [name] is a 67 year old white female known to Dr. Carlean [name] who has history of Barrett's esophagus and is status post Roux-en-Y gastric bypass done for obesity. She also is status post cholecystectomy, hysterectomy, and appendectomy has had lithotripsy for stones and history of fibromyalgia and depression. She had had a very long history of constipation but now over the past 6 months she has had problems with diarrhea. Last EGD was done in January of 2013 did show Barrett's esophagus but no dysplasia and an otherwise negative exam. She comes in today stating that she's been having severe abdominal pain over the past year primarily in the right upper quadrant and states that it is constant. She also has intermittent lower Donald cramping which seems to be exacerbated by eating. She also complains of dysphagia and states she often can't finish her meal and has to get up and regurgitate. This happens if she eats too fast in more often with meat or rice. She relates that she's had multiple infections over the past 5 years. Also that she has no energy. She says generally she'll have 3 or 4 bowel movements each morning and then may have smaller amounts of diarrhea throughout the day. She is currently taking been still just at bedtime for bladder spasms had recently started this. She is on Nexium chronically as well.    Review of Systems  Constitutional: Positive for appetite change, fatigue and unexpected weight change.  HENT: Positive for trouble swallowing.   Eyes: Negative.   Respiratory: Negative.   Cardiovascular: Negative.   Gastrointestinal: Positive for nausea, abdominal pain and diarrhea.  Endocrine: Negative.   Genitourinary: Negative.   Allergic/Immunologic: Negative.   Neurological: Negative.   Hematological: Negative.   Psychiatric/Behavioral: Negative.    Outpatient Prescriptions Prior  to Visit  Medication Sig Dispense Refill  . alendronate (FOSAMAX) 70 MG tablet Take 1 tablet (70 mg total) by mouth every 7 (seven) days. Take with a full glass of water on an empty stomach.  12 tablet  3  . Ascorbic Acid (VITAMIN C) 1000 MG tablet Take 1,000 mg by mouth daily.      . butalbital-acetaminophen-caffeine (FIORICET, ESGIC) 50-325-40 MG per tablet Take 1 tablet by mouth 3 (three) times daily. Headache  270 tablet  1  . CALCIUM-MAGNESIUM-ZINC PO Take by mouth daily.      . Cholecalciferol (VITAMIN D3) 2000 UNITS TABS Take 2,000 Units by mouth daily.      . clonazePAM (KLONOPIN) 1 MG tablet Take 0.5-1 mg by mouth 2 (two) times daily as needed. anxiety/panic attacks      . diclofenac (VOLTAREN) 25 MG EC tablet Take 1 tablet (25 mg total) by mouth 2 (two) times daily. As needed for pain  180 tablet  1  . diclofenac sodium (VOLTAREN) 1 % GEL Apply 2 g topically 4 (four) times daily as needed. For pain.      Marland Kitchen erythromycin ophthalmic ointment Place 0.5 application into both eyes at bedtime.       Marland Kitchen esomeprazole (NEXIUM) 40 MG capsule Take 1 capsule (40 mg total) by mouth daily before breakfast.  90 capsule  3  . fentaNYL (DURAGESIC - DOSED MCG/HR) 50 MCG/HR Place 1 patch onto the skin every 3 (three) days.      . Flaxseed, Linseed, (FLAX SEED OIL) 1000 MG CAPS Take 1,000 mg by mouth daily.      Marland Kitchen  gabapentin (NEURONTIN) 300 MG capsule Take 1 capsule (300 mg total) by mouth 3 (three) times daily.  270 capsule  3  . HYDROcodone-acetaminophen (NORCO) 7.5-325 MG per tablet Take 1 tablet by mouth every 6 (six) hours as needed. Pain       . lidocaine (LIDODERM) 5 % Place 1 patch onto the skin as needed. Remove & Discard patch within 12 hours or as directed by MD. Pain  90 patch  3  . Omega-3 Fatty Acids (SEA-OMEGA PO) Take 1,000 mg by mouth daily.      . polyethylene glycol powder (GLYCOLAX/MIRALAX) powder TAKE 17 GRAMS BY MOUTH DAILY IN JUICE  527 g  2  . propranolol (INDERAL) 20 MG tablet Take 1  tablet (20 mg total) by mouth as needed. For angina  90 tablet  3  . pyridoxine (B-6) 200 MG tablet Take 200 mg by mouth daily.      . solifenacin (VESICARE) 5 MG tablet Take 1 tablet (5 mg total) by mouth daily.  90 tablet  3  . SUMAtriptan (IMITREX) 100 MG tablet Take 1 tablet (100 mg total) by mouth every 3 (three) days as needed for migraine. Headache  48 tablet  3  . triamcinolone (NASACORT) 55 MCG/ACT nasal inhaler Place 2 sprays into the nose daily as needed. Allergies  3 Inhaler  3  . Valerian Root 450 MG CAPS Take 450 mg by mouth daily.      Marland Kitchen dicyclomine (BENTYL) 10 MG capsule Take 10 mg by mouth every evening.      . traZODone (DESYREL) 150 MG tablet Take 50 mg by mouth at bedtime.       No facility-administered medications prior to visit.   Allergies  Allergen Reactions  . Ambien (Zolpidem Tartrate) Anaphylaxis  . Codeine Anaphylaxis  . Oxycodone-Acetaminophen Shortness Of Breath  . Acetaminophen-Codeine Other (See Comments)    Shaky-chest feels heavy  . Darvocet (Propoxyphene-Acetaminophen)   . Food Swelling    Peanuts-wheat-  . Lorcet (Hydrocodone-Acetaminophen)   . Opium     hallucinations  . Oxycodone-Acetaminophen Other (See Comments)    Chest pain , nausea , pass out  . Tylox (Oxycodone-Acetaminophen)     Chest pain  . Zolpidem Tartrate Other (See Comments)    Makes her go crazy  . Zolpidem Tartrate     hallucinations   Patient Active Problem List  Diagnosis  . ANXIETY  . DEPRESSION  . GERD  . HIATAL HERNIA  . OSTEOARTHRITIS  . DISC DISEASE, CERVICAL  . DISC DISEASE, LUMBAR  . FIBROMYALGIA  . WEIGHT LOSS  . PERSONAL HISTORY OF FAILED MODERATE SEDATION  . Chronic constipation  . Chest pain  . Chronic interstitial cystitis   History  Substance Use Topics  . Smoking status: Never Smoker   . Smokeless tobacco: Never Used  . Alcohol Use: No   family history includes Breast cancer in her sister; Clotting disorder in her sister; Colon cancer in her  maternal aunt and paternal aunt; Diabetes in her mother; Heart disease in her father, mother, sister, and unspecified family member; Hypertension in her father; Kidney disease in her father and sister; Lung cancer in her father, mother, and sister; Pancreatic cancer in her sister; Seizures in her father; Stroke in her father and mother; and Uterine cancer in an unspecified family member.     Objective:   Physical Exam well-developed thin chronically ill-appearing white female in no acute distress but pressure 90/60 pulse 66 height 5 foot 5 weight 112  BMI of 18. HEENT, nontraumatic normocephalic EOMI PERRLA sclera anicteric, Supple no JVD, Cardiovascular, regular rate and rhythm with S1-S2 no murmur or gallop, Pulmonary, clear bilaterally, Abdomen, flat soft mildly tender in the right upper quadrant multiple incisional scars no palpable mass or hepatosplenomegaly no succussion splash bowel sounds are present, Rectal, exam not done, Extremities ,no clubbing cyanosis or edema skin warm and dry, Psych, mood and affect flat, appropriate.        Assessment & Plan:  #46  67 year old female with Barrett's esophagus-last screening January 2013 no dysplasia #2 one-year history of right upper quadrant pain and postprandial lower abdominal cramping and bloating as well as persistent diarrhea. She is status post cholecystectomy. Inpatient status post Roux-en-Y wonder if she may have a component of bacterial over growth. Also need to rule out ulcer disease. #3 solid food dysphasia-rule out stricture #4 status post Roux-en-Y gastric bypass for obesity #5 interstitial cystitis #6 depression  Plan; for now will continue Nexium 40 mg by mouth daily Will give her a trial of Xifaxan 550 mg by mouth twice daily x10 days-we'll use a 550 mg at as we have samples. Increase Bentyl to 10 mg by mouth 3 times daily Schedule for upper endoscopy with possible esophageal dilation with Dr. Harold Barban was discussed in  detail with the patient and she is agreeable to proceed

## 2012-03-31 NOTE — Progress Notes (Signed)
Agree with Ms. Esterwood's assessment and plan. Carl E. Gessner, MD, FACG   

## 2012-04-06 ENCOUNTER — Other Ambulatory Visit: Payer: Self-pay | Admitting: Family Medicine

## 2012-04-06 DIAGNOSIS — I208 Other forms of angina pectoris: Secondary | ICD-10-CM

## 2012-04-06 MED ORDER — PROPRANOLOL HCL 20 MG PO TABS: 20.0000 mg | ORAL_TABLET | Freq: Every day | ORAL | Status: DC | PRN

## 2012-04-07 ENCOUNTER — Encounter: Payer: Self-pay | Admitting: *Deleted

## 2012-04-07 ENCOUNTER — Other Ambulatory Visit: Payer: Self-pay | Admitting: Internal Medicine

## 2012-04-07 ENCOUNTER — Encounter: Payer: Self-pay | Admitting: Internal Medicine

## 2012-04-07 ENCOUNTER — Ambulatory Visit (AMBULATORY_SURGERY_CENTER): Payer: Medicare Other | Admitting: Internal Medicine

## 2012-04-07 VITALS — BP 114/75 | HR 83 | Temp 97.1°F | Resp 65 | Ht 65.0 in | Wt 112.0 lb

## 2012-04-07 DIAGNOSIS — R1013 Epigastric pain: Secondary | ICD-10-CM

## 2012-04-07 DIAGNOSIS — R1310 Dysphagia, unspecified: Secondary | ICD-10-CM | POA: Diagnosis not present

## 2012-04-07 DIAGNOSIS — Z79899 Other long term (current) drug therapy: Secondary | ICD-10-CM | POA: Diagnosis not present

## 2012-04-07 DIAGNOSIS — R1011 Right upper quadrant pain: Secondary | ICD-10-CM | POA: Diagnosis not present

## 2012-04-07 DIAGNOSIS — K2270 Barrett's esophagus without dysplasia: Secondary | ICD-10-CM | POA: Diagnosis not present

## 2012-04-07 DIAGNOSIS — R634 Abnormal weight loss: Secondary | ICD-10-CM | POA: Diagnosis not present

## 2012-04-07 MED ORDER — SODIUM CHLORIDE 0.9 % IV SOLN: 500.0000 mL | INTRAVENOUS | Status: DC

## 2012-04-07 NOTE — Progress Notes (Signed)
Lidocaine-40mg IV prior to Propofol InductionPropofol given over incremental dosages 

## 2012-04-07 NOTE — Patient Instructions (Addendum)
The exam looked ok - given that dilation helps your swallowing I dilated the esophagus again today. There was some irritation to the mouth and throat that caused a little bleeding from this - so you will see some bleeding from your mouth today but that should resove in 1-2 days. Please follow the special diet instructions after the dilation.  Please schedule a follow-up appointment by calling the office and we can review your situation and see what can be done to make you feel better.  Make appointment for 1-2 weeks with Dr. Carlean Purl.  Thank you for choosing me and [facility].  Gatha Mayer, MD, FACG    YOU HAD AN ENDOSCOPIC PROCEDURE TODAY AT [facility]: Refer to the procedure report that was given to you for any specific questions about what was found during the examination.  If the procedure report does not answer your questions, please call your gastroenterologist to clarify.  If you requested that your care partner not be given the details of your procedure findings, then the procedure report has been included in a sealed envelope for you to review at your convenience later.  YOU SHOULD EXPECT: Some feelings of bloating in the abdomen. Passage of more gas than usual.  Walking can help get rid of the air that was put into your GI tract during the procedure and reduce the bloating. If you had a lower endoscopy (such as a colonoscopy or flexible sigmoidoscopy) you may notice spotting of blood in your stool or on the toilet paper. If you underwent a bowel prep for your procedure, then you may not have a normal bowel movement for a few days.  DIET:    Drink plenty of fluids but you should avoid alcoholic beverages for 24 hours.  Please follow the dilatation diet the rest of the day.  ACTIVITY: Your care partner should take you home directly after the procedure.  You should plan to take it easy, moving slowly for the rest of the day.  You can resume normal activity  the day after the procedure however you should NOT DRIVE or use heavy machinery for 24 hours (because of the sedation medicines used during the test).    SYMPTOMS TO REPORT IMMEDIATELY: A gastroenterologist can be reached at any hour.  During normal business hours, 8:30 AM to 5:00 PM Monday through Friday, call [phone].  After hours and on weekends, please call the GI answering service at [phone] who will take a message and have the physician on call contact you.     Following upper endoscopy (EGD)  Vomiting of blood or coffee ground material  New chest pain or pain under the shoulder blades  Painful or persistently difficult swallowing  New shortness of breath  Fever of 100F or higher  Black, tarry-looking stools  FOLLOW UP: If any biopsies were taken you will be contacted by phone or by letter within the next 1-3 weeks.  Call your gastroenterologist if you have not heard about the biopsies in 3 weeks.  Our staff will call the home number listed on your records the next business day following your procedure to check on you and address any questions or concerns that you may have at that time regarding the information given to you following your procedure. This is a courtesy call and so if there is no answer at the home number and we have not heard from you through the emergency physician on call, we will assume that you  have returned to your regular daily activities without incident.  SIGNATURES/CONFIDENTIALITY: You and/or your care partner have signed paperwork which will be entered into your electronic medical record.  These signatures attest to the fact that that the information above on your After Visit Summary has been reviewed and is understood.  Full responsibility of the confidentiality of this discharge information lies with you and/or your care-partner.

## 2012-04-07 NOTE — Progress Notes (Signed)
Patient did not experience any of the following events: a burn prior to discharge; a fall within the facility; wrong site/side/patient/procedure/implant event; or a hospital transfer or hospital admission upon discharge from the facility. (G8907) Patient did not have preoperative order for IV antibiotic SSI prophylaxis. (G8918)  

## 2012-04-07 NOTE — Op Note (Signed)
[place]  [address] [place], [address]   ENDOSCOPY PROCEDURE REPORT  PATIENT: Deserie, Tong  MR#: [id number] BIRTHDATE: 1945/10/29 , 36  yrs. old GENDER: Female ENDOSCOPIST: Gatha Mayer, MD, [facility] PROCEDURE DATE:  04/07/2012 PROCEDURE:  EGD, diagnostic and Maloney dilation of esophagus ASA CLASS:     Class III INDICATIONS:  Epigastric pain.   Dysphagia. MEDICATIONS: propofol (Diprivan) 200mg  IV, MAC sedation, administered by CRNA, and These medications were titrated to patient response per physician's verbal order TOPICAL ANESTHETIC: none  DESCRIPTION OF PROCEDURE: After the risks benefits and alternatives of the procedure were thoroughly explained, informed consent was obtained.  The LB-GIF Q180 [id number] endoscope was introduced through the mouth and advanced to the proximal jejunum. Without limitations.  The instrument was slowly withdrawn as the mucosa was fully examined.        ESOPHAGUS: The mucosa of the esophagus appeared normal.   Z-line at 36 cm (irregular)  STOMACH: There was evidence of a gastro-entrostomy (Roux-en-Y) with gastric puch. The anastomosis was at 42 cm.  JEJUNUM: The exam showed no abnormalities in the jejunum - Roux limbs.  Retroflexed views in gastric pouch revealed no abnormalities.     The scope was then withdrawn from the patient, a 4 [place] passed with mild esophageal resistance (some difficulty advancing from pharynx to esophagus produced some heme - inspected w/ gastroscope and no significant trauma) and the procedure completed.  COMPLICATIONS: There were no complications. ENDOSCOPIC IMPRESSION: 1.   The mucosa of the esophagus appeared normal 2.   There was a prior Roux-en-Y gastroenterostomy - normal. 3.   The exam showed no abnormalities otherwise - a 81 [place] Maloney dilation was performed.  RECOMMENDATIONS: 1.  Clear liquids until 130 PM, then soft foods rest of day.  Resume prior diet  tomorrow. 2.  Call for office visit to be seen in 1-2weeks eSigned:  Gatha Mayer, MD, [facility] 04/07/2012 11:27 AM CC:The Patient

## 2012-04-07 NOTE — Progress Notes (Signed)
No complaints noted in the recovery room. Maw   

## 2012-04-07 NOTE — Progress Notes (Signed)
Called to room to assist during endoscopic procedure.  Patient ID and intended procedure confirmed with present staff. Received instructions for my participation in the procedure from the performing physician.  

## 2012-04-08 ENCOUNTER — Telehealth: Payer: Self-pay

## 2012-04-08 NOTE — Telephone Encounter (Signed)
Left message

## 2012-04-14 ENCOUNTER — Telehealth: Payer: Self-pay | Admitting: Internal Medicine

## 2012-04-14 NOTE — Telephone Encounter (Signed)
Spoke with patient and she states after her procedure last week, she had some bright red blood when she coughed up mucous. She continued to have mucous with blood that gradually changed to brown in color. This stopped yesterday. Today she states she is having bad throat pain. It had not hurt at all until today. Denies fever. Please, advise.

## 2012-04-14 NOTE — Telephone Encounter (Signed)
I spoke to her - pain is in neck and throat. Sore with swallowing. Has been around grandchildren with URI, etc. No fever.  This does not sound like a problem from EGD and dilation - 1 week out. She was ok immediately after that.  I have advised her to see PCP/[facility] for evaluation of suspected URI problem.

## 2012-04-21 ENCOUNTER — Ambulatory Visit (INDEPENDENT_AMBULATORY_CARE_PROVIDER_SITE_OTHER): Payer: Medicare Other | Admitting: Internal Medicine

## 2012-04-21 ENCOUNTER — Encounter: Payer: Self-pay | Admitting: Internal Medicine

## 2012-04-21 VITALS — BP 110/70 | HR 86 | Ht 65.0 in | Wt 117.0 lb

## 2012-04-21 DIAGNOSIS — R1319 Other dysphagia: Secondary | ICD-10-CM

## 2012-04-21 DIAGNOSIS — K589 Irritable bowel syndrome without diarrhea: Secondary | ICD-10-CM | POA: Diagnosis not present

## 2012-04-21 DIAGNOSIS — J029 Acute pharyngitis, unspecified: Secondary | ICD-10-CM

## 2012-04-21 DIAGNOSIS — R1310 Dysphagia, unspecified: Secondary | ICD-10-CM

## 2012-04-21 DIAGNOSIS — R1314 Dysphagia, pharyngoesophageal phase: Secondary | ICD-10-CM | POA: Diagnosis not present

## 2012-04-21 NOTE — Patient Instructions (Addendum)
Today you have been given a Dysphagia diet handout to read and follow level 5.  If you have problems with that level move down to level 4 and then level 3.  If your unable to do level 3 call us back.  Continue the dicyclomine.  Follow up with [place] as needed.  Your colonoscopy is due next year and we will notify you of that.  Thank you for choosing me and [facility].  Gatha Mayer, M.D., [facility]

## 2012-04-21 NOTE — Progress Notes (Signed)
  Subjective:    Patient ID: Kayla [name], female    DOB: [birth year], 67 y.o.   MRN: [id number]  HPI Had EGD and esophageal dilation late March. A few days after developed throat soreness on right side. That is getting better. No fever but sick great-grandchildren contacts. Dysphagia is improved but has had 1-2 episodes of problems with chicken.  No diarrhea and less abdominal pain with dicyclomine, 1 each day.  Overall better. Medications, allergies, past medical history, past surgical history, family history and social history are reviewed and updated in the EMR.  Review of Systems As above    Objective:   Physical Exam General:  NAD Pharynx: Clear Neck:  Supple, no mass and nontender     Assessment & Plan:  Esophageal dysphagia-improved  IBS (irritable bowel syndrome)-improved  Sore throat-improving  1. Diet modification fo dysphagia - try step 5 first - handout given 2. Continue dicyclomine - it helps at qd 3. Observe for continued improvement in sore throat 4. Routine screening colonoscopy 2015 5. See me prn otherwise

## 2012-05-18 ENCOUNTER — Telehealth: Payer: Self-pay

## 2012-05-18 DIAGNOSIS — R519 Headache, unspecified: Secondary | ICD-10-CM

## 2012-05-18 NOTE — Telephone Encounter (Signed)
Patient would like a refill on her trazodone and her headache medication if possible to [facility] please call patient at [phone]

## 2012-05-20 DIAGNOSIS — G8929 Other chronic pain: Secondary | ICD-10-CM | POA: Diagnosis not present

## 2012-05-20 DIAGNOSIS — IMO0001 Reserved for inherently not codable concepts without codable children: Secondary | ICD-10-CM | POA: Diagnosis not present

## 2012-05-20 DIAGNOSIS — M2550 Pain in unspecified joint: Secondary | ICD-10-CM | POA: Diagnosis not present

## 2012-05-20 DIAGNOSIS — M5380 Other specified dorsopathies, site unspecified: Secondary | ICD-10-CM | POA: Diagnosis not present

## 2012-05-20 NOTE — Telephone Encounter (Signed)
Last traz apparently Dr Edilia Bo 1/my last ov says down to 25mg  at hs//is this correct dose? Which Ha med???

## 2012-05-21 NOTE — Telephone Encounter (Signed)
Called pt, she is currently back on 150 dose, tried to decrease this but was unable. She is requesting Fioricet.

## 2012-05-31 MED ORDER — BUTALBITAL-APAP-CAFFEINE 50-325-40 MG PO TABS: 1.0000 | ORAL_TABLET | Freq: Three times a day (TID) | ORAL | Status: DC

## 2012-05-31 NOTE — Telephone Encounter (Signed)
Meds ordered this encounter  Medications  . butalbital-acetaminophen-caffeine (FIORICET, ESGIC) 50-325-40 MG per tablet    Sig: Take 1 tablet by mouth 3 (three) times daily. Headache    Dispense:  270 tablet    Refill:  1    [facility] [address]   I cannot find a prescription for trazodone on her active medication list I see a prescription refill in January by Dr. Edilia Bo for 90 tablets with one or 2 refills which should be plenty -although I'm not sure why this does not show up on her active medication list I am at a loss for what to do

## 2012-06-01 MED ORDER — TRAZODONE HCL 150 MG PO TABS: 150.0000 mg | ORAL_TABLET | Freq: Every day | ORAL | Status: DC

## 2012-06-01 MED ORDER — BUTALBITAL-APAP-CAFFEINE 50-325-40 MG PO TABS: 1.0000 | ORAL_TABLET | Freq: Three times a day (TID) | ORAL | Status: DC

## 2012-06-01 NOTE — Telephone Encounter (Signed)
Thanks. Called her to advise. Rx sent in for her.

## 2012-06-01 NOTE — Telephone Encounter (Signed)
[facility] Can call in 1 mo supply and have the other sent to mail order

## 2012-06-01 NOTE — Telephone Encounter (Signed)
Pt will call us back when she get back in town about trazodone.  She would like a month rx of Fioricet sent into local pharmacy [facility] [address] since it will take her a month to get the 3 month rx from [place].    Rx for Fioricet for 63mos faxed to [facility].

## 2012-06-18 ENCOUNTER — Telehealth: Payer: Self-pay | Admitting: Internal Medicine

## 2012-06-18 NOTE — Telephone Encounter (Signed)
Patient notes continued weight loss and nausea.  I have scheduled her for an office visit for 06/29/12

## 2012-06-29 ENCOUNTER — Encounter: Payer: Self-pay | Admitting: Internal Medicine

## 2012-06-29 ENCOUNTER — Ambulatory Visit (INDEPENDENT_AMBULATORY_CARE_PROVIDER_SITE_OTHER): Payer: Medicare Other | Admitting: Internal Medicine

## 2012-06-29 VITALS — BP 90/48 | HR 84 | Ht 65.0 in | Wt 112.2 lb

## 2012-06-29 DIAGNOSIS — R110 Nausea: Secondary | ICD-10-CM

## 2012-06-29 DIAGNOSIS — K911 Postgastric surgery syndromes: Secondary | ICD-10-CM | POA: Diagnosis not present

## 2012-06-29 MED ORDER — ONDANSETRON 8 MG PO TBDP: 8.0000 mg | ORAL_TABLET | Freq: Three times a day (TID) | ORAL | Status: DC | PRN

## 2012-06-29 NOTE — Progress Notes (Signed)
  Subjective:    Patient ID: Kayla [name], female    DOB: 1945/06/13, 67 y.o.   MRN: [id number]  HPI Patient presents in followup. She is complaining of fatigue, fluctuating weight and fluctuating blood sugars low or high. She eats about 2 meals a day and says she cannot follow a dumping syndrome diet at least in terms of small frequent meals because she's chronically nauseated. She is able to do some work and then has to go back to bed because of severe fatigue. She tells me she is not consistently taking her medications because she is concerned about side effects were some of them. She keeps a headache. Basically these are symptoms and she's had chronically for years. I last saw her a few months ago at which point she underwent an EGD with dilation for complaints of dysphagia after having that procedure arranged by one of our physician assistants. She does report that dicyclomine helps some of her abdominal cramps when she uses it. Medications, allergies, past medical history, past surgical history, family history and social history are reviewed and updated in the EMR.   Review of Systems As above.    Objective:   Physical Exam General:  NAD Eyes:   anicteric Lungs:  clear Heart:  S1S2 no rubs, murmurs or gallops Abdomen:  soft and nontender, BS+ Ext:  Palmar creases are red, she has good capillary refill and pink nailbeds  Data Reviewed:   Wt Readings from Last 3 Encounters:  06/29/12 112 lb 4 oz (50.916 kg)  04/21/12 117 lb (53.071 kg)  04/07/12 112 lb (50.803 kg)      Assessment & Plan:   1. Postsurgical dumping syndrome   2. Chronic nausea    Is a complicated patient with chronic problem status post more than one abdominal surgery she now has a gastroenterostomy. At least some if not most of her symptoms could be related to dumping syndrome but early in late.  I believe we didn't do this before but I am giving her a dumping syndrome diet again today and asking her to  follow that and to eat several small meals a day. I don't think there is anything seriously objectively wrong here her weight does fluctuate but she's had multiple investigations over the years and I don't think further imaging or endoscopic evaluation is necessary right now.  I've also prescribed Zofran 8 mg centigrade tablet to use for her nausea to see if that provides some relief.  I've asked her to followup with Dr. [name] regarding her indication side effect concerns. Some of her symptoms could also be from medications, she has many complaints at times and an effort to help her I know medications are prescribed as as I did that for Zofran today, hopefully that will cause any new problems for her.   Would not be surprised there is not some element of depression or dysthymic disturbance associated.  Is instructed to see me as needed.   CC: DOOLITTLE, Linton Ham, MD

## 2012-06-29 NOTE — Patient Instructions (Addendum)
Today you have been given information to read and follow on Dumping Syndrome.  Try to eat several small meals a day.  We have sent medications to your pharmacy for you to pick up at your convenience.   I appreciate the opportunity to care for you.

## 2012-07-13 ENCOUNTER — Telehealth: Payer: Self-pay | Admitting: Internal Medicine

## 2012-07-13 DIAGNOSIS — R110 Nausea: Secondary | ICD-10-CM

## 2012-07-13 NOTE — Telephone Encounter (Signed)
Called the [facility] at [phone] and per Simona Huh in customer service we can now e-scribe rx's to the [facility] by putting in [facility] as the pharmacy name.  We are not to fax them the form the patient faxed me.  That form according to Simona Huh is to be mailed by the patient if she is mailing in the rx's herself.  Either way it can take up to 21 days for patient to get their rx.

## 2012-07-13 NOTE — Telephone Encounter (Signed)
Spoke to patient and she is going to fax a form to me to get her medicine thru [facility].  She is now requesting the zofran and the generic Bentyl come from there since it is free.  She can get 90 day per rx with up to a year refills and then they send her one month at a time.  Please advise how much you want me to rx, thank you Sir.

## 2012-07-13 NOTE — Telephone Encounter (Signed)
Please advise , don't think we can call in to [facility].  May we give her last office note so they may rx?

## 2012-07-13 NOTE — Telephone Encounter (Signed)
Can print an rx she may take

## 2012-07-14 MED ORDER — DICYCLOMINE HCL 10 MG PO CAPS: ORAL_CAPSULE | ORAL | Status: DC

## 2012-07-14 MED ORDER — ONDANSETRON 8 MG PO TBDP: 8.0000 mg | ORAL_TABLET | Freq: Three times a day (TID) | ORAL | Status: DC | PRN

## 2012-07-14 NOTE — Telephone Encounter (Signed)
Per a verbal ok from Dr. Carlean Purl the zofran and generic bentyl have been refilled thru [facility].  Left message on patient's voice mail that these were sent in and that I was mailing her the forms back to her that she faxed me yesterday.

## 2012-07-15 ENCOUNTER — Other Ambulatory Visit: Payer: Self-pay | Admitting: Internal Medicine

## 2012-07-15 DIAGNOSIS — M542 Cervicalgia: Secondary | ICD-10-CM | POA: Diagnosis not present

## 2012-07-15 DIAGNOSIS — M5380 Other specified dorsopathies, site unspecified: Secondary | ICD-10-CM | POA: Diagnosis not present

## 2012-07-15 DIAGNOSIS — M2550 Pain in unspecified joint: Secondary | ICD-10-CM | POA: Diagnosis not present

## 2012-07-15 DIAGNOSIS — R52 Pain, unspecified: Secondary | ICD-10-CM | POA: Diagnosis not present

## 2012-07-21 ENCOUNTER — Telehealth: Payer: Self-pay

## 2012-07-21 NOTE — Telephone Encounter (Signed)
Pt is needing to get a rx for trazodone please call patient at [phone] she is needing a few called into a pharmacy and the rest sent to mail order

## 2012-07-22 MED ORDER — TRAZODONE HCL 150 MG PO TABS: 150.0000 mg | ORAL_TABLET | Freq: Every day | ORAL | Status: DC

## 2012-07-22 NOTE — Telephone Encounter (Signed)
Ok to send 1 month supply to pharmacy of choice then pt due for OV as last seen Jan 2014

## 2012-07-22 NOTE — Telephone Encounter (Signed)
Left message for her to call back with pharmacy information.

## 2012-07-26 ENCOUNTER — Ambulatory Visit (INDEPENDENT_AMBULATORY_CARE_PROVIDER_SITE_OTHER): Payer: Medicare Other | Admitting: Internal Medicine

## 2012-07-26 VITALS — BP 96/60 | HR 73 | Temp 98.0°F | Resp 18 | Wt 112.0 lb

## 2012-07-26 DIAGNOSIS — R634 Abnormal weight loss: Secondary | ICD-10-CM

## 2012-07-26 DIAGNOSIS — M25559 Pain in unspecified hip: Secondary | ICD-10-CM

## 2012-07-26 DIAGNOSIS — J019 Acute sinusitis, unspecified: Secondary | ICD-10-CM

## 2012-07-26 DIAGNOSIS — R531 Weakness: Secondary | ICD-10-CM

## 2012-07-26 DIAGNOSIS — M6250 Muscle wasting and atrophy, not elsewhere classified, unspecified site: Secondary | ICD-10-CM

## 2012-07-26 DIAGNOSIS — R5381 Other malaise: Secondary | ICD-10-CM | POA: Diagnosis not present

## 2012-07-26 DIAGNOSIS — IMO0001 Reserved for inherently not codable concepts without codable children: Secondary | ICD-10-CM

## 2012-07-26 DIAGNOSIS — M810 Age-related osteoporosis without current pathological fracture: Secondary | ICD-10-CM

## 2012-07-26 DIAGNOSIS — R519 Headache, unspecified: Secondary | ICD-10-CM

## 2012-07-26 DIAGNOSIS — N200 Calculus of kidney: Secondary | ICD-10-CM

## 2012-07-26 DIAGNOSIS — M797 Fibromyalgia: Secondary | ICD-10-CM | POA: Insufficient documentation

## 2012-07-26 DIAGNOSIS — R5383 Other fatigue: Secondary | ICD-10-CM | POA: Diagnosis not present

## 2012-07-26 DIAGNOSIS — E559 Vitamin D deficiency, unspecified: Secondary | ICD-10-CM

## 2012-07-26 DIAGNOSIS — R29898 Other symptoms and signs involving the musculoskeletal system: Secondary | ICD-10-CM

## 2012-07-26 MED ORDER — TRAZODONE HCL 150 MG PO TABS: 150.0000 mg | ORAL_TABLET | Freq: Every day | ORAL | Status: DC

## 2012-07-26 MED ORDER — AMOXICILLIN 875 MG PO TABS: 875.0000 mg | ORAL_TABLET | Freq: Two times a day (BID) | ORAL | Status: DC

## 2012-07-26 NOTE — Progress Notes (Deleted)
  Subjective:    Patient ID: Kayla [name], female    DOB: [birth year], 67 y.o.   MRN: [id number]  HPI    Review of Systems     Objective:   Physical Exam        Assessment & Plan:

## 2012-07-26 NOTE — Progress Notes (Signed)
Subjective:    Patient ID: Kayla [name], female    DOB: Feb 04, 1945, 67 y.o.   MRN: [id number]  HPI Kayla [name] is a 67 y.o. female presenting for:  1)  Wants diagnostic mammogram to include ultrasound - left breast & lymph nodes have been painful x 15 years.  FH of breast cancer - sister, aunt, cousin and more.    2)  Weight loss - 102-111 lbs, 111lb = constipation, 102 lb = cleaned out.  Unable to eat without being nauseated.  Tried gluten free diet and "dumping" diet but nothing seems to help---bad appetite as well  3)  6 month hx of sinus pain, pressure, congestion; nasal congestion; productive cough with green sputum; ear pain; - fever, sneezing, SOB, chest pain.  Gotten worse.  Hx of deviated septum.  4)  Pressure point spots.  "boney."  Painful.  Wants rx for honeycomb foam pads from medical supply. Mainly both hip points and knees/elbows . She thinks rubbing sores  5)  Fatigue - Only leaves house for doctor, church and grocery shopping.  In bed 90% of her day due to fatigue.  Wants rx for wheelchair ramp from [facility].    Other issues= Patient Active Problem List   Diagnosis Date Noted  . Fibromyalgia 07/26/2012  . Chronic interstitial cystitis 10/16/2011  . Chest pain 12/21/2010  . Chronic constipation 10/04/2010  . WEIGHT LOSS 11/27/2009  . PERSONAL HISTORY OF FAILED MODERATE SEDATION 06/01/2008  . ANXIETY 03/18/2007  . DEPRESSION 03/18/2007  . GERD 03/18/2007  . HIATAL HERNIA 03/18/2007  . OSTEOARTHRITIS 03/18/2007  . [place] DISEASE, CERVICAL 03/18/2007  . [place] DISEASE, LUMBAR 03/18/2007  . xhronic pain treatment 03/18/2007  Current outpatient prescriptions:alendronate (FOSAMAX) 70 MG tablet, Take 1 tablet (70 mg total) by mouth every 7 (seven) days.  Ascorbic Acid (VITAMIN C) 1000 MG tablet, Take 1,000 mg by mouth daily., Disp: , Rfl: ;  butalbital-acetaminophen-caffeine (FIORICET, ESGIC) 50-325-40 MG per tablet, Take 1 tablet by mouth 3 (three) times  daily. Headache, Disp: 90 tablet, Rfl: 0 CALCIUM-MAGNESIUM-ZINC PO, Take by mouth daily., Disp: , Rfl: ;   clonazePAM (KLONOPIN) 1 MG tablet, Take 0.5-1 mg by mouth 2 (two) times daily as needed. anxiety/panic attacks, Disp: , Rfl: ;   diclofenac (VOLTAREN) 25 MG EC tablet, Take 1 tablet (25 mg total) by mouth 2 (two) times daily. As needed for pain, Disp: 180 tablet, Rfl: 1;   dicyclomine (BENTYL) 10 MG capsule, Take 1 tab 3 times daily as needed for spasms and cramping., Disp: 270 capsule, Rfl: 1;   erythromycin ophthalmic ointment, Place 0.5 application into both eyes at bedtime. , Disp: , Rfl: ;  esomeprazole (NEXIUM) 40 MG capsule, Take 1 capsule (40 mg total) by mouth daily before breakfast., Disp: 90 capsule, Rfl: 3 fentaNYL (DURAGESIC - DOSED MCG/HR) 75 MCG/HR, Place 1 patch onto the skin every 3 (three) days., Disp: , Rfl: ;   Flaxseed, Linseed, (FLAX SEED OIL) 1000 MG CAPS, Take 1,000 mg by mouth daily., Disp: , Rfl: ;  gabapentin (NEURONTIN) 300 MG capsule, Take 1 capsule (300 mg total) by mouth 3 (three) times daily., Disp: 270 capsule, Rfl: 3 HYDROcodone-acetaminophen (NORCO) 7.5-325 MG per tablet, Take 1 tablet by mouth every 6 (six) hours as needed. Pain , Disp: , Rfl: ;  lidocaine (LIDODERM) 5 %, Place 1 patch onto the skin as needed. Remove & Discard patch within 12 hours or as directed by MD. Pain, Disp: 90 patch, Rfl: 3;  Omega-3 Fatty Acids (SEA-OMEGA PO), Take 1,000 mg by mouth daily., Disp: , Rfl:  ondansetron (ZOFRAN-ODT) 8 MG disintegrating tablet, Take 1 tablet (8 mg total) by mouth every 8 (eight) hours as needed for nausea., Disp: 90 tablet, Rfl: 1;  polyethylene glycol powder (GLYCOLAX/MIRALAX) powder, TAKE 17 GRAMS BY MOUTH DAILY IN JUICE, Disp: 527 g, Rfl: 2;  propranolol (INDERAL) 20 MG tablet, Take 1 tablet (20 mg total) by mouth daily as needed. For angina, Disp: 90 tablet, Rfl: 3 pyridoxine (B-6) 200 MG tablet, Take 200 mg by mouth daily., Disp: , Rfl: ;   solifenacin  (VESICARE) 5 MG tablet, Take 1 tablet (5 mg total) by mouth daily., Disp: 90 tablet, Rfl: 3;  SUMAtriptan (IMITREX) 100 MG tablet, Take 1 tablet (100 mg total) by mouth every 3 (three) days as needed for migraine. Headache, Disp: 48 tablet, Rfl: 3;  traZODone (DESYREL) 150 MG tablet, Take 1 tablet (150 mg total) by mouth at bedtime., Disp: 90 tablet, Rfl: 3 triamcinolone (NASACORT) 55 MCG/ACT nasal inhaler, Place 2 sprays into the nose daily as needed. Allergies, Disp: 3 Inhaler, Rfl: 3;   Cholecalciferol (VITAMIN D3) 2000 UNITS TABS, Take 2,000 Units by mouth daily., Disp: , Rfl:   Needs some refills No chg in pain protocol No change in psych ststus   Review of Systems As stated in HPI - otherwise negative. No fever chills sweats Note recent full eval per Kayla [name]    Objective:   Physical Exam Filed Vitals:   07/26/12 1317  BP: 96/60  Pulse: 73  Temp: 98 F (36.7 C)  TempSrc: Oral  Resp: 18  Weight: 112 lb (50.803 kg)   General:  emaciated  female in no acute distress. Skin:  Soft, warm skin with good turgor.  No bruising or lesions present. No decubitii HEENT:   Head - normocephalic, no visible or palpable masses.  Eyes - conjunctivae clear, sclera white, PERRL.    Ears - EACs clear.  TMs obscured by wax.  Hearing intact.   Nose - Patent.  Nasal mucosa boggy w/ purulence.  Septum midline.   Mouth/Throat - Mucous membranes are dry and pink.  No obvious periodontal disease.   No tonsillar hypertrophy or exudate.   Neck - supple without lymphadenopathy or thyromegaly . Cardiovascular: S1 and S2 distinct with no murmurs, rubs or gallops. Respiratory:  CTA with no adventitial sounds. Hip rom ok with no pain on palp No muscle weakness asymetrical--weak in general     67min w/ OV Assessment & Plan:  1. Sinusitis, acute Prescribed Amoxicillin (AMOXIL) 875 MG tablet; Take 1 tablet (875 mg total) by mouth 2 (two) times daily.  Dispense: 20 tablet; Refill: 0.  Call office if  symptoms do not steadily improve or any acute worsening.  2. Fibromyalgia 3. Loss of weight 4. Weakness 5. Muscular deconditioning Diagnoses 2 through 5 are all related.  Referral for nutritionist and physical therapy. We need to prevent move to wheelchair and total isolation/giving up   6. Pain, hip, unspecified laterality--both due to bony prominence pressure Rx for Honeycomb Mattress Pad given.    Diagnostic mammogram not necessary--done 3 yr ago.  Continue yearly screening.    PAS [facility] [place] involved for educ reasons

## 2012-07-27 ENCOUNTER — Telehealth: Payer: Self-pay

## 2012-07-27 NOTE — Telephone Encounter (Signed)
Do you want her to have a hospital bed? I am not sure she qualifies.

## 2012-07-27 NOTE — Telephone Encounter (Signed)
PT STATES MEDICARE WON'T PAY FOR THE HONEYCONE MATTRESS PAD BECAUSE OF THE CODES. WOULD LIKE DR DOOLITTLE TO CALL MEDICARE TO SEE IF THEY WOULD AUTHORIZED A HOSPITAL BED BECAUSE SHE IS GETTING PRESSURE POINTS ON HER BODY. THEY NEED HER SSN WHICH IS [id number] WHEN WE CALL MEDICARE PLEASE CALL PT AT [phone]

## 2012-07-28 DIAGNOSIS — M810 Age-related osteoporosis without current pathological fracture: Secondary | ICD-10-CM

## 2012-07-28 DIAGNOSIS — E559 Vitamin D deficiency, unspecified: Secondary | ICD-10-CM | POA: Insufficient documentation

## 2012-07-28 DIAGNOSIS — R519 Headache, unspecified: Secondary | ICD-10-CM

## 2012-07-28 DIAGNOSIS — N200 Calculus of kidney: Secondary | ICD-10-CM | POA: Insufficient documentation

## 2012-07-28 HISTORY — DX: Headache, unspecified: R51.9

## 2012-07-28 HISTORY — DX: Age-related osteoporosis without current pathological fracture: M81.0

## 2012-07-28 HISTORY — DX: Calculus of kidney: N20.0

## 2012-07-28 NOTE — Telephone Encounter (Signed)
Patient advised she does not qualify for the bed nor the pad. She is advised to purchase the foam support at a retail store.

## 2012-07-28 NOTE — Telephone Encounter (Signed)
I guess she would need active decub ulcer to qualify for pad///won't qualify for Bed at this point either(no neuro impairment)-would advise getting some foam support of some type but may need to buy without medicare

## 2012-07-30 ENCOUNTER — Ambulatory Visit: Payer: Medicare Other | Attending: Internal Medicine

## 2012-07-30 DIAGNOSIS — M6281 Muscle weakness (generalized): Secondary | ICD-10-CM | POA: Diagnosis not present

## 2012-07-30 DIAGNOSIS — R5381 Other malaise: Secondary | ICD-10-CM | POA: Insufficient documentation

## 2012-07-30 DIAGNOSIS — IMO0001 Reserved for inherently not codable concepts without codable children: Secondary | ICD-10-CM | POA: Insufficient documentation

## 2012-07-31 ENCOUNTER — Telehealth: Payer: Self-pay

## 2012-07-31 NOTE — Telephone Encounter (Signed)
Pt is calling to see if anyone has had any luck with finding out if Medicare will cover hospital bed Call back number is [phone]

## 2012-07-31 NOTE — Telephone Encounter (Signed)
Pt advised again she does not qualify for a hospital bed. There was a bad connection and I could not hear her . I am unsure if she has heard me.

## 2012-08-05 ENCOUNTER — Ambulatory Visit: Payer: Medicare Other

## 2012-08-05 DIAGNOSIS — R5381 Other malaise: Secondary | ICD-10-CM | POA: Diagnosis not present

## 2012-08-05 DIAGNOSIS — M6281 Muscle weakness (generalized): Secondary | ICD-10-CM | POA: Diagnosis not present

## 2012-08-05 DIAGNOSIS — IMO0001 Reserved for inherently not codable concepts without codable children: Secondary | ICD-10-CM | POA: Diagnosis not present

## 2012-08-07 ENCOUNTER — Ambulatory Visit: Payer: Medicare Other

## 2012-08-10 ENCOUNTER — Ambulatory Visit: Payer: Medicare Other | Admitting: Physical Therapy

## 2012-08-11 ENCOUNTER — Ambulatory Visit: Payer: Medicare Other | Admitting: Physical Therapy

## 2012-08-13 ENCOUNTER — Ambulatory Visit: Payer: Medicare Other

## 2012-08-14 ENCOUNTER — Ambulatory Visit: Payer: Medicare Other | Admitting: Physical Therapy

## 2012-08-17 ENCOUNTER — Ambulatory Visit: Payer: Medicare Other | Attending: Internal Medicine

## 2012-08-17 ENCOUNTER — Other Ambulatory Visit: Payer: Self-pay | Admitting: Internal Medicine

## 2012-08-17 DIAGNOSIS — R5381 Other malaise: Secondary | ICD-10-CM | POA: Diagnosis not present

## 2012-08-17 DIAGNOSIS — IMO0001 Reserved for inherently not codable concepts without codable children: Secondary | ICD-10-CM | POA: Insufficient documentation

## 2012-08-17 DIAGNOSIS — M6281 Muscle weakness (generalized): Secondary | ICD-10-CM | POA: Diagnosis not present

## 2012-08-17 DIAGNOSIS — Z1231 Encounter for screening mammogram for malignant neoplasm of breast: Secondary | ICD-10-CM

## 2012-08-18 ENCOUNTER — Ambulatory Visit: Payer: Medicare Other

## 2012-08-24 ENCOUNTER — Ambulatory Visit: Payer: Medicare Other

## 2012-08-25 ENCOUNTER — Ambulatory Visit: Payer: Medicare Other | Admitting: Occupational Therapy

## 2012-08-25 ENCOUNTER — Ambulatory Visit: Payer: Medicare Other

## 2012-08-27 ENCOUNTER — Ambulatory Visit: Payer: Medicare Other | Admitting: *Deleted

## 2012-08-27 ENCOUNTER — Ambulatory Visit: Payer: Medicare Other

## 2012-09-01 ENCOUNTER — Ambulatory Visit (HOSPITAL_COMMUNITY): Payer: Medicare Other

## 2012-09-01 ENCOUNTER — Telehealth: Payer: Self-pay

## 2012-09-01 NOTE — Telephone Encounter (Signed)
PT STATES SHE WOULD LIKE DR DOOLITTLE TO SEND HER SOMEWHERE SO SHE CAN GET OFF THE DRUGS SHE IS, STATES ALL SHE DOES IS LAY IN BED AND SLEEP PLEASE CALL [phone] AND SHE ONLY WANT DR DOOLITTLE TO CALL HER BACK

## 2012-09-02 ENCOUNTER — Ambulatory Visit: Payer: Medicare Other

## 2012-09-02 ENCOUNTER — Telehealth: Payer: Self-pay

## 2012-09-02 NOTE — Telephone Encounter (Signed)
Patient states that her husband will be helping to ween her off of her meds on a weekly basis as discussed with Dr. [name] in a previous visit. Patient states that she and her husband would like to come in and speak with Dr. [name] through a scheduled appt. If possible. [phone]

## 2012-09-04 ENCOUNTER — Ambulatory Visit: Payer: Medicare Other

## 2012-09-04 NOTE — Telephone Encounter (Signed)
i could add them at 10 am on 9/3

## 2012-09-04 NOTE — Telephone Encounter (Signed)
Dr [name] your next available appointment is not until October.  I was wanting to know if you would like her to come to the walk-in clinic or if you want me to over book you.

## 2012-09-07 ENCOUNTER — Ambulatory Visit: Payer: Medicare Other

## 2012-09-07 ENCOUNTER — Ambulatory Visit (HOSPITAL_COMMUNITY)
Admission: RE | Admit: 2012-09-07 | Discharge: 2012-09-07 | Disposition: A | Discharge status: Home / Self Care | Payer: Medicare Other | Source: Ambulatory Visit | Attending: Internal Medicine | Admitting: Internal Medicine

## 2012-09-07 ENCOUNTER — Ambulatory Visit: Payer: Medicare Other | Admitting: Occupational Therapy

## 2012-09-07 DIAGNOSIS — Z1231 Encounter for screening mammogram for malignant neoplasm of breast: Secondary | ICD-10-CM | POA: Insufficient documentation

## 2012-09-07 NOTE — Telephone Encounter (Signed)
I have scheduled appointment with patient and made her aware of the appointment date and time.

## 2012-09-08 ENCOUNTER — Ambulatory Visit (HOSPITAL_COMMUNITY): Payer: Medicare Other

## 2012-09-09 ENCOUNTER — Ambulatory Visit: Payer: Medicare Other

## 2012-09-09 ENCOUNTER — Encounter: Payer: Medicare Other | Admitting: Occupational Therapy

## 2012-09-10 ENCOUNTER — Ambulatory Visit: Payer: Medicare Other | Admitting: Physical Therapy

## 2012-09-15 ENCOUNTER — Ambulatory Visit: Payer: Medicare Other

## 2012-09-15 ENCOUNTER — Encounter: Payer: Medicare Other | Admitting: Occupational Therapy

## 2012-09-16 ENCOUNTER — Encounter: Payer: Self-pay | Admitting: Internal Medicine

## 2012-09-16 ENCOUNTER — Ambulatory Visit (INDEPENDENT_AMBULATORY_CARE_PROVIDER_SITE_OTHER): Payer: Medicare Other | Admitting: Internal Medicine

## 2012-09-16 ENCOUNTER — Telehealth: Payer: Self-pay | Admitting: *Deleted

## 2012-09-16 ENCOUNTER — Ambulatory Visit (HOSPITAL_COMMUNITY): Admission: RE | Admit: 2012-09-16 | Payer: Medicare Other | Source: Ambulatory Visit

## 2012-09-16 VITALS — BP 95/63 | HR 78 | Temp 98.6°F | Resp 16 | Ht 65.5 in | Wt 112.4 lb

## 2012-09-16 DIAGNOSIS — F3289 Other specified depressive episodes: Secondary | ICD-10-CM

## 2012-09-16 DIAGNOSIS — M5137 Other intervertebral disc degeneration, lumbosacral region: Secondary | ICD-10-CM

## 2012-09-16 DIAGNOSIS — F411 Generalized anxiety disorder: Secondary | ICD-10-CM

## 2012-09-16 DIAGNOSIS — M51379 Other intervertebral disc degeneration, lumbosacral region without mention of lumbar back pain or lower extremity pain: Secondary | ICD-10-CM

## 2012-09-16 DIAGNOSIS — M810 Age-related osteoporosis without current pathological fracture: Secondary | ICD-10-CM

## 2012-09-16 DIAGNOSIS — R51 Headache: Secondary | ICD-10-CM

## 2012-09-16 DIAGNOSIS — K219 Gastro-esophageal reflux disease without esophagitis: Secondary | ICD-10-CM

## 2012-09-16 DIAGNOSIS — F329 Major depressive disorder, single episode, unspecified: Secondary | ICD-10-CM

## 2012-09-16 DIAGNOSIS — M5030 Other cervical disc degeneration, unspecified cervical region: Secondary | ICD-10-CM | POA: Diagnosis not present

## 2012-09-16 DIAGNOSIS — IMO0001 Reserved for inherently not codable concepts without codable children: Secondary | ICD-10-CM

## 2012-09-16 DIAGNOSIS — R519 Headache, unspecified: Secondary | ICD-10-CM

## 2012-09-16 MED ORDER — TRAZODONE HCL 150 MG PO TABS: 150.0000 mg | ORAL_TABLET | Freq: Every day | ORAL | Status: DC

## 2012-09-16 MED ORDER — CLONAZEPAM 1 MG PO TABS: 0.5000 mg | ORAL_TABLET | Freq: Two times a day (BID) | ORAL | Status: DC | PRN

## 2012-09-16 MED ORDER — DULOXETINE HCL 30 MG PO CPEP: ORAL_CAPSULE | ORAL | Status: DC

## 2012-09-16 MED ORDER — GABAPENTIN 300 MG PO CAPS: 300.0000 mg | ORAL_CAPSULE | Freq: Three times a day (TID) | ORAL | Status: DC

## 2012-09-16 MED ORDER — BUTALBITAL-APAP-CAFFEINE 50-325-40 MG PO TABS: 1.0000 | ORAL_TABLET | Freq: Three times a day (TID) | ORAL | Status: DC

## 2012-09-16 NOTE — Progress Notes (Signed)
Subjective:    Patient ID: Kayla [name], female    DOB: [birth year], 67 y.o.   MRN: [id number]  HPIhere w/ husband to make serious life changes--is tired of being obtunded all the time. Living from med to med. In bed most of the year-pain and depression stays so depressed/has weaned antidepr out of fear for kidneys(family deaths)---but now crying all the time Sleeping to avoid feelings for last 3 years Still terrible relat w/ kids---great stress//has improved relat w/ husband Decided not to go to detox for withdrawal, but wants to with draw pain meds   Patient Active Problem List   Diagnosis Date Noted  . HA (headache)-chronic/tension type---continues c/ daily meds 07/28/2012  . Osteoporosis, unspecified 07/28/2012  . Unspecified vitamin D deficiency-2009 07/28/2012  . Nephrolithiasis--none recent 07/28/2012  . Fibromyalgia---continues to be in constant pain--was ref to PT recently but too deconditioned to do workouts so now on walking program 07/26/2012  . Chronic interstitial cystitis 10/16/2011  . Chest pain--see card eval 12/21/2010  . Chronic constipation 10/04/2010  . WEIGHT LOSS 11/27/2009  . PERSONAL HISTORY OF FAILED MODERATE SEDATION 06/01/2008  . ANXIETY 03/18/2007  . DEPRESSION 03/18/2007  . GERD--note recent endo no barretts 03/18/2007  . HIATAL HERNIA--symptomatic 03/18/2007  . OSTEOARTHRITIS 03/18/2007  . [place] DISEASE, CERVICAL 03/18/2007  . DISC DISEASE, LUMBAR 03/18/2007   Dr Donato Schultz Therapist moved Dr Betsey Amen 3/14/believes dumping syndr possible  Current outpatient prescriptions:Ascorbic Acid (VITAMIN C) 1000 MG tablet, Take 1,000 mg by mouth daily., Disp: , Rfl: ;   butalbital-acetaminophen-caffeine (FIORICET, ESGIC) 50-325-40 MG per tablet, Take 1 tablet by mouth 3 (three) times daily. Headache-is taking 4 a day not 3  CALCIUM-MAGNESIUM-ZINC PO, Take by mouth daily., Disp: , Rfl: ;   Cholecalciferol (VITAMIN D3) 2000 UNITS TABS, Take 2,000 Units  by mouth daily., Disp: , Rfl:  clonazePAM (KLONOPIN) 1 MG tablet, Take 0.5-1 mg by mouth 2 (two) times daily as needed. anxiety/panic attacks,    diclofenac sodium (VOLTAREN) 1 % GEL, Apply 2 g topically 4 (four) times daily as needed. For pain OFF., Disp: , Rfl: ;   dicyclomine (BENTYL) 10 MG capsule, Take 1 tab 3 times daily as needed for spasms and cramping., Disp: 270 capsule, Rfl: 1 esomeprazole (NEXIUM) 40 MG capsule, Take 1 capsule (40 mg total) by mouth daily before breakfast., Disp: 90 capsule, Rfl: 3;   fentaNYL (DURAGESIC - DOSED MCG/HR) 75 MCG/HR, Place 1 patch onto the skin every 3 (three) days.---worst pain neck and L shoulder/all over from fibromyalgia--cancelled appt for injections cause last ones hurt Dr Darene Lamer.  Flaxseed, Linseed, (FLAX SEED OIL) 1000 MG CAPS, Take 1,000 mg by mouth daily., Disp: , Rfl:  HYDROcodone-acetaminophen (NORCO) 7.5-325 MG per tablet, Take 1 tablet by mouth every 6 (six) hours as needed.- down to 2 a day ;   lidocaine (LIDODERM) 5 %, Place 1 patch onto the skin as needed. Remove & Discard patch within 12 hours or as directed by MD. Pain---only one or less per day   Omega-3 Fatty Acids (SEA-OMEGA PO), Take 1,000 mg by mouth daily., Disp: , Rfl:  ondansetron (ZOFRAN-ODT) 8 MG disintegrating tablet, Take 1 tablet (8 mg total) by mouth every 8 (eight) hours as needed for nausea., Disp: 90 tablet, Rfl: 1;   propranolol (INDERAL) 20 MG tablet, Take 1 tablet (20 mg total) by mouth daily as needed. For angina, Disp: 90 tablet, Rfl: 3;   pyridoxine (B-6) 200 MG tablet, Take 200 mg by mouth daily., Disp: , Rfl:  SUMAtriptan (IMITREX) 100 MG tablet, Take 1 tablet (100 mg total) by mouth every 3 (three) days as needed for migraine. Headache-migr only once every 2 mos  traZODone (DESYREL) 150 MG tablet, Take 1 tablet (150 mg total) by mouth at bedtime.,---may use more cause can't sleep til 5am then can't wake up  triamcinolone (NASACORT) 55 MCG/ACT nasal inhaler, Place 2  sprays into the nose daily as needed. Allergies, Disp: 3 Inhaler, Rfl: 3 alendronate (FOSAMAX) 70 MG tablet, Take 1 tablet (70 mg total) by mouth every 7 (seven) days. Take with a full glass of water on an empty stomach., Disp: 12 tablet, Rfl: 3;  erythromycin ophthalmic ointment, Place 0.5 application into both eyes at bedtime. , Disp: , Rfl: ;   gabapentin (NEURONTIN) 300 MG capsule, Take 1 capsule (300 mg total) by mouth 3 (three) times daily., Disp: 270 capsule, Rfl: 3----off of this polyethylene glycol powder (GLYCOLAX/MIRALAX) powder, TAKE 17 GRAMS BY MOUTH DAILY IN JUICE, Disp: 527 g, Rfl: 2;  solifenacin (VESICARE) 5 MG tablet, Take 1 tablet (5 mg total) by mouth daily., Disp: 90 tablet, Rfl: 3 [DISCONTINUED] buPROPion (WELLBUTRIN) 75 MG tablet, Take 75 mg by mouth daily after breakfast. , Disp: , Rfl: ;  [DISCONTINUED] solifenacin (VESICARE) 5 MG tablet, Take 1 tablet (5 mg total) by mouth daily., Disp: 90 tablet, Rfl: 3  Review of Systems  Constitutional: Positive for appetite change and fatigue. Negative for fever.       Has gained weight with better diet  HENT: Positive for neck pain, neck stiffness and tinnitus. Negative for hearing loss, ear pain, congestion, facial swelling, trouble swallowing and voice change.        Continues w/ intermitt tin  Eyes: Negative for photophobia and visual disturbance.  Respiratory: Negative for cough, chest tightness and shortness of breath.   Cardiovascular: Negative for chest pain, palpitations and leg swelling.  Gastrointestinal: Negative for blood in stool and rectal pain.  Genitourinary:       Urinary sxt stable recently  Musculoskeletal: Negative for joint swelling.  Skin: Negative for rash.  Neurological: Positive for weakness. Negative for tremors, speech difficulty and light-headedness.  Hematological: Negative for adenopathy. Does not bruise/bleed easily.  Psychiatric/Behavioral: Positive for sleep disturbance, dysphoric mood, decreased  concentration and agitation. Negative for suicidal ideas. The patient is nervous/anxious.        Sleeps is interupted by pain/trouble at initiation req meds incr traz       Objective:   Physical Exam  Constitutional: She is oriented to person, place, and time. No distress.  Frail appearing  Eyes: Conjunctivae and EOM are normal. Pupils are equal, round, and reactive to light.  Neck: No thyromegaly present.  Cardiovascular: Normal rate and regular rhythm.   Pulmonary/Chest: Effort normal.  Lymphadenopathy:    She has no cervical adenopathy.  Neurological: She is alert and oriented to person, place, and time. No cranial nerve deficit.  Skin: No rash noted.  Psychiatric:  Mood is improved over usual total pessimism          Assessment & Plan:  HA (headache)   ANXIETY  DEPRESSION  DISC DISEASE, CERVICAL  DISC DISEASE, LUMBAR  FIBROMYALGIA  GERD  Osteoporosis, unspecified  PLAN: Refer to [facility] Add cymbalta Restart neurontin Cont traz but take w/ klonopin hs Stay off wellb Dr T--?fentanyl withdrawal Wean fioricet Wean HC Walk-reume PT when better nutr when available Ortho and possibly rheum support needed traz plus clono at hs  60 min ov w/  she and husband  Meds ordered this encounter  Medications  . butalbital-acetaminophen-caffeine (FIORICET, ESGIC) 50-325-40 MG per tablet    Sig: Take 1 tablet by mouth 3 (three) times daily. Headache    Dispense:  90 tablet    Refill:  0    To [facility] [address]  . gabapentin (NEURONTIN) 300 MG capsule    Sig: Take 1 capsule (300 mg total) by mouth 3 (three) times daily.    Dispense:  270 capsule    Refill:  3  . clonazePAM (KLONOPIN) 1 MG tablet    Sig: Take 0.5-1 tablets (0.5-1 mg total) by mouth 2 (two) times daily as needed. anxiety/panic attacks    Dispense:  180 tablet    Refill:  1  . traZODone (DESYREL) 150 MG tablet    Sig: Take 1 tablet (150 mg total) by mouth at bedtime.    Dispense:  90 tablet     Refill:  3  . butalbital-acetaminophen-caffeine (FIORICET) 50-325-40 MG per tablet    Sig: Take 1 tablet by mouth 3 (three) times daily. Prn HA    Dispense:  270 tablet    Refill:  0    To [facility]  . traZODone (DESYREL) 150 MG tablet    Sig: Take 1 tablet (150 mg total) by mouth at bedtime.    Dispense:  30 tablet    Refill:  0    -  cymbalta 30 daily F/u 6 w ?immuniz-esp Pnvax

## 2012-09-16 NOTE — Telephone Encounter (Signed)
Called prescriptions to pharmacy [phone]) left RX FIORICET,ESGIC and trazodone on voice mail.

## 2012-09-17 ENCOUNTER — Ambulatory Visit: Payer: Medicare Other

## 2012-09-17 ENCOUNTER — Encounter: Payer: Medicare Other | Admitting: Occupational Therapy

## 2012-09-17 ENCOUNTER — Other Ambulatory Visit: Payer: Self-pay

## 2012-09-17 DIAGNOSIS — R519 Headache, unspecified: Secondary | ICD-10-CM

## 2012-09-17 MED ORDER — CLONAZEPAM 1 MG PO TABS: 0.5000 mg | ORAL_TABLET | Freq: Two times a day (BID) | ORAL | Status: DC | PRN

## 2012-09-17 MED ORDER — TRAZODONE HCL 150 MG PO TABS: 150.0000 mg | ORAL_TABLET | Freq: Every day | ORAL | Status: DC

## 2012-09-17 MED ORDER — GABAPENTIN 300 MG PO CAPS: 300.0000 mg | ORAL_CAPSULE | Freq: Three times a day (TID) | ORAL | Status: DC

## 2012-09-17 MED ORDER — BUTALBITAL-APAP-CAFFEINE 50-325-40 MG PO TABS: 1.0000 | ORAL_TABLET | Freq: Three times a day (TID) | ORAL | Status: DC

## 2012-09-17 NOTE — Telephone Encounter (Signed)
Rxs faxed yesterday but fax failed. Reprinted Rxs for Dr [name] to sign and refax.

## 2012-09-22 ENCOUNTER — Ambulatory Visit: Payer: Medicare Other

## 2012-09-22 ENCOUNTER — Encounter: Payer: Medicare Other | Admitting: Occupational Therapy

## 2012-09-24 ENCOUNTER — Encounter: Payer: Medicare Other | Admitting: Occupational Therapy

## 2012-09-24 ENCOUNTER — Ambulatory Visit: Payer: Medicare Other

## 2012-09-24 DIAGNOSIS — IMO0001 Reserved for inherently not codable concepts without codable children: Secondary | ICD-10-CM | POA: Diagnosis not present

## 2012-09-24 DIAGNOSIS — F329 Major depressive disorder, single episode, unspecified: Secondary | ICD-10-CM | POA: Diagnosis not present

## 2012-09-24 DIAGNOSIS — M542 Cervicalgia: Secondary | ICD-10-CM | POA: Diagnosis not present

## 2012-09-25 DIAGNOSIS — H16219 Exposure keratoconjunctivitis, unspecified eye: Secondary | ICD-10-CM | POA: Diagnosis not present

## 2012-09-29 ENCOUNTER — Ambulatory Visit: Payer: Medicare Other

## 2012-10-01 ENCOUNTER — Ambulatory Visit: Payer: Medicare Other

## 2012-10-06 ENCOUNTER — Ambulatory Visit: Payer: Medicare Other

## 2012-10-08 ENCOUNTER — Ambulatory Visit: Payer: Medicare Other

## 2012-10-12 ENCOUNTER — Ambulatory Visit: Payer: Medicare Other | Admitting: Dietician

## 2012-10-13 ENCOUNTER — Telehealth: Payer: Self-pay

## 2012-10-13 NOTE — Telephone Encounter (Signed)
PT WANTED DR DOOLITTLE TO KNOW SHE FELL GETTING OUT OF THE BATHTUB AND HURT HERSELF. WILL BE IN TO SEE HIM ON Friday AND WANTED TO MAKE SURE HE KNEW THAT IT WILL TAKE MORE THAN 15 MINUTES SINCE SHE HAVE TO HAVE A LOT OF XRAYS. YOU MAY REACH HER AT [phone] IF NEEDED

## 2012-10-22 DIAGNOSIS — M25539 Pain in unspecified wrist: Secondary | ICD-10-CM | POA: Diagnosis not present

## 2012-10-22 DIAGNOSIS — R079 Chest pain, unspecified: Secondary | ICD-10-CM | POA: Diagnosis not present

## 2012-10-22 DIAGNOSIS — S7000XA Contusion of unspecified hip, initial encounter: Secondary | ICD-10-CM | POA: Diagnosis not present

## 2012-10-29 DIAGNOSIS — M25539 Pain in unspecified wrist: Secondary | ICD-10-CM | POA: Diagnosis not present

## 2012-11-04 ENCOUNTER — Ambulatory Visit: Admitting: Internal Medicine

## 2012-11-06 ENCOUNTER — Telehealth: Payer: Self-pay

## 2012-11-06 MED ORDER — LIDOCAINE 5 % EX PTCH: 1.0000 | MEDICATED_PATCH | CUTANEOUS | Status: DC | PRN

## 2012-11-06 NOTE — Telephone Encounter (Signed)
lidocaine (LIDODERM) 5 % Refills  [facility] in [place]

## 2012-11-06 NOTE — Telephone Encounter (Signed)
[facility]ants [facility] remaining [facility] to [place]  [phone]

## 2012-11-06 NOTE — Telephone Encounter (Signed)
Sent!

## 2012-11-10 ENCOUNTER — Encounter: Payer: Medicare Other | Attending: Internal Medicine | Admitting: Dietician

## 2012-11-10 ENCOUNTER — Encounter: Payer: Self-pay | Admitting: Dietician

## 2012-11-10 VITALS — Ht 65.0 in | Wt 110.7 lb

## 2012-11-10 DIAGNOSIS — R634 Abnormal weight loss: Secondary | ICD-10-CM | POA: Insufficient documentation

## 2012-11-10 DIAGNOSIS — Z713 Dietary counseling and surveillance: Secondary | ICD-10-CM | POA: Insufficient documentation

## 2012-11-10 NOTE — Patient Instructions (Addendum)
Try to eat or drink a protein drink every 3-5 hours that you are awake.  Look for Glucerna or Boost Glucose Control (or any other protein drink you can tolerate with out affecting your blood sugar) and have about 3 per day. Add as much fat to foods that you can tolerate such as butter, whole milk, cream, oil, salad dressing, mayonannaise, nuts, peanut butter. Eat protein foods first, then add vegetables. Try proteins such as eggs, any kind of fish, chicken, [place], deli meat, pork, ground meat, whole milk milk yogurt, and beans. Try to cook meats with a lot of moisture or sauces to help with swallowing.  Try to focus on chewing really thoroughly, about 30 chews per bite of food. Avoid drinking anything 15 minutes before eating, during a meal, or 30 minutes a meal.

## 2012-11-10 NOTE — Progress Notes (Signed)
Medical Nutrition Therapy:  Appt start time: 1130 end time:  1230.   Assessment:  Primary concerns today: Kayla [name] is here today since she has a lot of chronic health problems and weight loss as a result of RYGB in 1999. Appears very thin and has noticeable muscle wasting. Had a hx of fibromyalgia and GI problems before RYGB. Diagnosed with osteoporosis after hysterectomy in early 1990s. 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   Had her stomach stapled in 1990 when she weighed 230 lbs and lost weight but regained it. States she only had the RYGB because she was told that it would prevent esophageal cancer. States she has a strong fear of cancer d/t family history.  Weight around 170 pounds before the RYGB and went down to 112 lbs within 2 years and then gained weight back until 156 lbs in another year. In the past year she has lost a lot of weight (typical weight has been around 100 pounds). States she is very weak and has trouble hold up her body weight, is falling down, and breaking bones. Has very low energy level. Stomach cramps have been worse in the past year, need "lots" of laxatives to have bowel movement, and needs to have esophagus dilated every few years. Has trouble swallowing big pieces of food. Has struggled with hypoglycemia in the past and avoids having too many carbs since makes blood sugar go up high. Not officially diagnosed with diabetes. Tests bloods sugar at home if suspects high or low.   Husband Laverna Peace primarily prepares food. Medically retired in 2005. Weight goal is 135 pounds.   Preferred Learning Style:   No preference indicated   Learning Readiness:   Ready  MEDICATIONS: see list   DIETARY INTAKE:  Avoided foods include big pieces of food, sweets, limits carbs, meats that are are hard to digest, high fat hurts stomach   24-hr recall:  B ( AM): egg and toast (doesn't finish it) with bacon or tomato  with water and 1/2 cup coffee Snk ( AM): none L ( 2-3PM): cheese toast with ham with ice water or Gatorade  Snk ( PM): none D ( PM): TV dinner or loaded baked potato or [employer] or steak house with water Snk ( PM): nuts with water or Gatorade and fruit Beverages: water or Gatorade  Usual physical activity: stretches in bed  Estimated energy needs: 1600 calories 180 g carbohydrates 120 g protein 44 g fat  Progress Towards Goal(s):  In progress.   Nutritional Diagnosis:  [place]-3.1 Underweight As related to hx of RYGB and inadequate protein and energy intake.  As evidenced by BMI of 18.4.    Intervention:  Nutrition counseling provided. Recommended that Mai increase her protein to at least 60 g per day with foods and nutrition supplements (Ensure or Boost). Recommended adding fats to foods as tolerated to increase calories. Recommended small frequent meals. Discussed the importance of thoroughly chewing foods to help with tolerance and avoiding drinking beverages around meal times so she doesn't fill up on liquids.   Plan:  Try to eat or drink a protein drink every 3-5 hours that you are awake.  Look for Glucerna or Boost Glucose Control (or any other protein drink you can tolerate with out affecting your blood sugar) and have about 3 per day. Add as much fat to foods that you can tolerate such as butter, whole milk, cream, oil, salad dressing, mayonannaise, nuts,  peanut butter. Eat protein foods first, then add vegetables. Try proteins such as eggs, any kind of fish, chicken, [place], deli meat, pork, ground meat, whole milk milk yogurt, and beans. Try to cook meats with a lot of moisture or sauces to help with swallowing.  Try to focus on chewing really thoroughly, about 30 chews per bite of food. Avoid drinking anything 15 minutes before eating, during a meal, or 30 minutes a meal.    Teaching Method Utilized:  Visual Auditory   Handouts given during visit include:  Phase IIIA  Protein List  Pre-Op goals  Barriers to learning/adherence to lifestyle change: low energy and many chronic health conditions  Demonstrated degree of understanding via:  Teach Back   Monitoring/Evaluation:  Dietary intake, exercise, and body weight in 4 week(s).

## 2012-11-19 ENCOUNTER — Other Ambulatory Visit: Payer: Self-pay

## 2012-11-21 ENCOUNTER — Telehealth: Payer: Self-pay

## 2012-11-21 DIAGNOSIS — J019 Acute sinusitis, unspecified: Secondary | ICD-10-CM

## 2012-11-21 NOTE — Telephone Encounter (Signed)
PATIENT WOULD LIKE DR. DOOLITTLE TO CALL HER SOMETHING INTO THE PHARMACY FOR A SINUS INFECTION. SHE HAS HAD IT FOR OVER 1 WEEK. SHE SAID IT IS WELL DOCUMENTED THAT SHE HAS A HISTORY OF GETTING THEM. SHE IS OUT OF TOWN IN [place]. SHE HAS TRIED MUCINEX, BUT IT IS NOT HELPING. SHE SAID SHE HAS A DEVIATED SEPTUM ALSO. BEST PHONE [phone] (CELL)  OR  [phone] (THIS IS HER MOTHER-N-LAWS PHONE) JUST ASK FOR Kayla [name].   [place]

## 2012-11-22 NOTE — Telephone Encounter (Signed)
Ok to call in amox [phone] bid #20 if not allergic and if we have # for pharmacy

## 2012-11-23 MED ORDER — AMOXICILLIN 875 MG PO TABS: 875.0000 mg | ORAL_TABLET | Freq: Two times a day (BID) | ORAL | Status: DC

## 2012-11-23 NOTE — Telephone Encounter (Signed)
Patient advised, if not better with this, have told her to return to clinic

## 2012-12-01 DIAGNOSIS — M542 Cervicalgia: Secondary | ICD-10-CM | POA: Diagnosis not present

## 2012-12-01 DIAGNOSIS — M2550 Pain in unspecified joint: Secondary | ICD-10-CM | POA: Diagnosis not present

## 2012-12-01 DIAGNOSIS — M5380 Other specified dorsopathies, site unspecified: Secondary | ICD-10-CM | POA: Diagnosis not present

## 2012-12-01 DIAGNOSIS — IMO0001 Reserved for inherently not codable concepts without codable children: Secondary | ICD-10-CM | POA: Diagnosis not present

## 2012-12-21 ENCOUNTER — Ambulatory Visit: Payer: Medicare Other | Admitting: Dietician

## 2013-02-02 ENCOUNTER — Other Ambulatory Visit: Payer: Self-pay

## 2013-02-02 DIAGNOSIS — G43909 Migraine, unspecified, not intractable, without status migrainosus: Secondary | ICD-10-CM

## 2013-02-02 DIAGNOSIS — J309 Allergic rhinitis, unspecified: Secondary | ICD-10-CM

## 2013-02-02 MED ORDER — DULOXETINE HCL 30 MG PO CPEP: ORAL_CAPSULE | ORAL | Status: DC

## 2013-02-02 MED ORDER — SUMATRIPTAN SUCCINATE 100 MG PO TABS
100.0000 mg | ORAL_TABLET | ORAL | Status: DC | PRN
Start: 2013-02-02 — End: 2014-11-04

## 2013-02-02 MED ORDER — TRIAMCINOLONE ACETONIDE 55 MCG/ACT NA AERO: 2.0000 | INHALATION_SPRAY | Freq: Every day | NASAL | Status: DC

## 2013-02-02 MED ORDER — DICYCLOMINE HCL 10 MG PO CAPS: ORAL_CAPSULE | ORAL | Status: DC

## 2013-02-02 NOTE — Telephone Encounter (Signed)
Ready to be faxed.

## 2013-02-02 NOTE — Telephone Encounter (Signed)
Dr [name], pt brought by Rxs she wants RFs for. Fax to [facility] [phone]. I have pended them. Please review because only one was specifically addressed at last OV, and wanted to make sure you wanted to RF.

## 2013-02-02 NOTE — Telephone Encounter (Signed)
Faxed Rxs to [facility].

## 2013-02-08 ENCOUNTER — Ambulatory Visit (INDEPENDENT_AMBULATORY_CARE_PROVIDER_SITE_OTHER): Payer: Medicare Other | Admitting: Internal Medicine

## 2013-02-08 VITALS — BP 112/60 | HR 99 | Temp 98.5°F | Resp 20 | Ht 65.0 in | Wt 108.8 lb

## 2013-02-08 DIAGNOSIS — F411 Generalized anxiety disorder: Secondary | ICD-10-CM

## 2013-02-08 DIAGNOSIS — Z189 Retained foreign body fragments, unspecified material: Secondary | ICD-10-CM

## 2013-02-08 DIAGNOSIS — Z23 Encounter for immunization: Secondary | ICD-10-CM

## 2013-02-08 DIAGNOSIS — M1990 Unspecified osteoarthritis, unspecified site: Secondary | ICD-10-CM | POA: Diagnosis not present

## 2013-02-08 DIAGNOSIS — R51 Headache: Secondary | ICD-10-CM | POA: Diagnosis not present

## 2013-02-08 DIAGNOSIS — M5137 Other intervertebral disc degeneration, lumbosacral region: Secondary | ICD-10-CM

## 2013-02-08 DIAGNOSIS — M797 Fibromyalgia: Secondary | ICD-10-CM

## 2013-02-08 DIAGNOSIS — K5909 Other constipation: Secondary | ICD-10-CM

## 2013-02-08 DIAGNOSIS — K319 Disease of stomach and duodenum, unspecified: Secondary | ICD-10-CM

## 2013-02-08 DIAGNOSIS — R519 Headache, unspecified: Secondary | ICD-10-CM

## 2013-02-08 DIAGNOSIS — K3189 Other diseases of stomach and duodenum: Secondary | ICD-10-CM

## 2013-02-08 DIAGNOSIS — K219 Gastro-esophageal reflux disease without esophagitis: Secondary | ICD-10-CM

## 2013-02-08 DIAGNOSIS — T8189XA Other complications of procedures, not elsewhere classified, initial encounter: Secondary | ICD-10-CM

## 2013-02-08 DIAGNOSIS — IMO0001 Reserved for inherently not codable concepts without codable children: Secondary | ICD-10-CM

## 2013-02-08 DIAGNOSIS — K449 Diaphragmatic hernia without obstruction or gangrene: Secondary | ICD-10-CM | POA: Diagnosis not present

## 2013-02-08 DIAGNOSIS — F329 Major depressive disorder, single episode, unspecified: Secondary | ICD-10-CM

## 2013-02-08 DIAGNOSIS — J309 Allergic rhinitis, unspecified: Secondary | ICD-10-CM

## 2013-02-08 DIAGNOSIS — IMO0002 Reserved for concepts with insufficient information to code with codable children: Secondary | ICD-10-CM

## 2013-02-08 DIAGNOSIS — M5030 Other cervical disc degeneration, unspecified cervical region: Secondary | ICD-10-CM

## 2013-02-08 DIAGNOSIS — K5900 Constipation, unspecified: Secondary | ICD-10-CM

## 2013-02-08 DIAGNOSIS — F3289 Other specified depressive episodes: Secondary | ICD-10-CM

## 2013-02-08 DIAGNOSIS — M51379 Other intervertebral disc degeneration, lumbosacral region without mention of lumbar back pain or lower extremity pain: Secondary | ICD-10-CM

## 2013-02-08 MED ORDER — FLUTICASONE PROPIONATE 50 MCG/ACT NA SUSP: NASAL | Status: DC

## 2013-02-08 MED ORDER — CLONAZEPAM 1 MG PO TABS: 0.5000 mg | ORAL_TABLET | Freq: Two times a day (BID) | ORAL | Status: DC | PRN

## 2013-02-08 MED ORDER — TRAZODONE HCL 150 MG PO TABS: 150.0000 mg | ORAL_TABLET | Freq: Every day | ORAL | Status: DC

## 2013-02-08 MED ORDER — DICYCLOMINE HCL 10 MG PO CAPS: ORAL_CAPSULE | ORAL | Status: DC

## 2013-02-08 MED ORDER — ESOMEPRAZOLE MAGNESIUM 40 MG PO CPDR: 40.0000 mg | DELAYED_RELEASE_CAPSULE | Freq: Every day | ORAL | Status: DC

## 2013-02-08 MED ORDER — DICLOFENAC SODIUM 1 % TD GEL: 2.0000 g | Freq: Four times a day (QID) | TRANSDERMAL | Status: DC | PRN

## 2013-02-08 MED ORDER — ZOSTER VACCINE LIVE 19400 UNT/0.65ML ~~LOC~~ SOLR: 0.6500 mL | Freq: Once | SUBCUTANEOUS | Status: DC

## 2013-02-08 MED ORDER — BUTALBITAL-APAP-CAFFEINE 50-325-40 MG PO TABS: 1.0000 | ORAL_TABLET | Freq: Three times a day (TID) | ORAL | Status: DC

## 2013-02-08 MED ORDER — LIDOCAINE 5 % EX PTCH: 1.0000 | MEDICATED_PATCH | CUTANEOUS | Status: DC | PRN

## 2013-02-08 NOTE — Progress Notes (Addendum)
Subjective:    Patient ID: Kayla [name], female    DOB: 05/20/1945, 68 y.o.   MRN: [id number] This chart was scribed for Kayla Lin, MD by Vernell Barrier, Medical Scribe. This patient's care was started at 7:05 PM.  HPI HPI Comments: [name] is a 68 y.o. female who presents to the [facility] for medication refill.   Patient Active Problem List   Diagnosis Date Noted  . HA (headache)-chronic/tension type 07/28/2012  . Osteoporosis, unspecified 07/28/2012  . Unspecified vitamin D deficiency-2009 07/28/2012  . Nephrolithiasis 07/28/2012  . Fibromyalgia 07/26/2012  . Chronic interstitial cystitis 10/16/2011  . Chest pain 12/21/2010  . Chronic constipation 10/04/2010  . WEIGHT LOSS 11/27/2009  . PERSONAL HISTORY OF FAILED MODERATE SEDATION 06/01/2008  . ANXIETY 03/18/2007  . DEPRESSION 03/18/2007  . GERD 03/18/2007  . HIATAL HERNIA- pus and odor coming from new navel 03/18/2007  . OSTEOARTHRITIS 03/18/2007  . [place] DISEASE, CERVICAL 03/18/2007  . [place] DISEASE, LUMBAR 03/18/2007  . FIBROMYALGIA 03/18/2007   HIATAL HERNIA: Pt had surgery in 1999 that resulted in the creation of a new navel. States there is now pus coming out of new naval with melodious odorous discharge/off-and-on for several years but she forgets to mention.  States she still chokes on rice, dried meats, and raw vegetables. Lots of GERD off of medication  Also reports intermittent sharp pain in RUQ. Random but mostly postprandial. Does not have a gallbladder.  Pt had a flu shot this year. Was inquiring whether it was too late to receive a pneumonia and shingles shot. States she believes she received some correspondence from [facility] letting her know to come back in and get pneumonia shot.   MEDICATION FIORICET: Was told Firoicet causes rebound HA in which she has been experiencing. Stopped taking a month ago and states the HA's have not gone away. Pt states she is no longer  nauseous when she eats and has no real use for Zofran.  KLONOPIN: Still taking .5 pill 2x/day at 1 mg  VESICARE: Not taking  CYMBALTA: Not taking  TRAZODONE: Taking at bedtime  VOLTAREN CREAM: States this is really helping  FENTANYL PATCHES: Still taking  VICODIN: Cut way down but still using. 1-2 per day. States if its a really bad day she will take 2-4 per day.   NEXIUM: Takes from time to take. Says she was going to discontinue but reflux has returned worse than before.  LIDODERM PATCHES: Still taking  FOSOMAX: Not taking  PROPRANOLOL: Not taking  BENTYL: Takes once in a while. States it does work. Says within 5 minutes of eating she begins having abdominal cramps and cannot finish her meal.  NEURONTIN: Stopped taking  IMITREX: States she is still taking and it works but does make her nauseous. Last time taking was this morning.   NASACORT: Still taking for dry nose along with humidifier.  Review of Systems  Constitutional: Positive for fatigue. Negative for fever, activity change, appetite change and unexpected weight change.  Eyes: Negative.   Respiratory: Negative.   Cardiovascular: Negative.   Gastrointestinal:       Occasional sharp right upper quadrant pains that may be related to postprandial processes  Endocrine: Negative.   Genitourinary: Negative.   Neurological: Positive for headaches.   All systems negative except those listed above in the HPI.  Objective:   Physical Exam  Nursing note and vitals reviewed. Constitutional: She is oriented to person, place, and time. She appears  well-developed and well-nourished. No distress.  HENT:  Head: Normocephalic and atraumatic.  Eyes: Conjunctivae and EOM are normal. Pupils are equal, round, and reactive to light.  Neck: Neck supple. No thyromegaly present.  Cardiovascular: Normal rate and regular rhythm.   Pulmonary/Chest: Effort normal. No respiratory distress.  Abdominal: She exhibits no mass. There is no  guarding.  In the umbilicus is a small white stitch Protruding from the center lower aspect with a slight wetness but not specifically pus. This area is tender but there is little inflammation.  Musculoskeletal: Normal range of motion. She exhibits no edema.  Lymphadenopathy:    She has no cervical adenopathy.  Neurological: She is alert and oriented to person, place, and time. No cranial nerve deficit.  Skin: Skin is warm and dry.  Psychiatric: She has a normal mood and affect. Her behavior is normal. Judgment and thought content normal.  This is as positive as she has been in many months despite her chronic problems   Assessment & Plan:  Pt will have her medications refilled. Switching from Nasacort to Flonase.   I have completed the patient encounter in its entirety as documented by the scribe, with editing by me where necessary. Rowen Wilmer P. [name], M.D.  HA (headache) - Plan: butalbital-acetaminophen-caffeine (FIORICET, ESGIC) 50-325-40 MG per tablet//  She was off this medication for over a month with no appreciable difference in the frequency of her headaches and so it's less likely that she has analgesic rebound headaches from this drug  This seems to be the only medicine that gives her some relief from headache so we'll restart it until she has her evaluation with neurology  Spasm of GI tract - Plan: esomeprazole (NEXIUM) 40 MG capsule, dicyclomine (BENTYL) 10 MG capsule  OSTEOARTHRITIS - Plan: diclofenac sodium (VOLTAREN) 1 % GEL  HIATAL HERNIA - Plan: esomeprazole (NEXIUM) 40 MG capsule  GERD - Plan: esomeprazole (NEXIUM) 40 MG capsule  Fibromyalgia--- to continue chronic pain management  DISC DISEASE, LUMBAR - Plan: lidocaine (LIDODERM) 5 %  DISC DISEASE, CERVICAL  DEPRESSION--- no change in medications  Chronic constipation  ANXIETY - Plan: clonazePAM (KLONOPIN) 1 MG tablet, traZODone (DESYREL) 150 MG tablet  Allergic rhinitis  Retained suture - Plan: Ambulatory  referral to General Surgery  Need for shingles vaccine-rx given  Pneumovax given   Meds ordered this encounter  Medications  . butalbital-acetaminophen-caffeine (FIORICET, ESGIC) 50-325-40 MG per tablet    Sig: Take 1 tablet by mouth 3 (three) times daily. Headache    Dispense:  90 tablet    Refill:  2    To [facility] [address]  . clonazePAM (KLONOPIN) 1 MG tablet    Sig: Take 0.5-1 tablets (0.5-1 mg total) by mouth 2 (two) times daily as needed. anxiety/panic attacks    Dispense:  180 tablet    Refill:  1  . traZODone (DESYREL) 150 MG tablet    Sig: Take 1 tablet (150 mg total) by mouth at bedtime.    Dispense:  90 tablet    Refill:  3  . diclofenac sodium (VOLTAREN) 1 % GEL    Sig: Apply 2 g topically 4 (four) times daily as needed. For pain.    Dispense:  100 g    Refill:  6  . esomeprazole (NEXIUM) 40 MG capsule    Sig: Take 1 capsule (40 mg total) by mouth daily before breakfast.    Dispense:  90 capsule    Refill:  3  . lidocaine (LIDODERM) 5 %  Sig: Place 1 patch onto the skin as needed. Remove & Discard patch within 12 hours. May dispense as 3 month supply    Dispense:  30 patch    Refill:  11  . dicyclomine (BENTYL) 10 MG capsule    Sig: Take 1-2 tab 3 times daily as needed for spasms and cramping.    Dispense:  270 capsule    Refill:  3  . fluticasone (FLONASE) 50 MCG/ACT nasal spray    Sig: 2 sprays each nostril hs. May do 3 months at a time    Dispense:  16 g    Refill:  11  . zoster vaccine live, PF, (ZOSTAVAX) 29562 UNT/0.65ML injection    Sig: Inject 19,400 Units into the skin once.    Dispense:  1 each    Refill:  0   Use  [employer] mail order and sent for Fioricet Referral to neurology from pain management to evaluate better options for headache

## 2013-02-09 NOTE — Progress Notes (Signed)
Faxed in Rxs to [facility].

## 2013-02-26 ENCOUNTER — Ambulatory Visit (INDEPENDENT_AMBULATORY_CARE_PROVIDER_SITE_OTHER): Payer: Medicare Other | Admitting: Family Medicine

## 2013-02-26 VITALS — BP 90/68 | HR 83 | Temp 98.5°F | Resp 16 | Ht 65.5 in | Wt 109.2 lb

## 2013-02-26 DIAGNOSIS — R300 Dysuria: Secondary | ICD-10-CM

## 2013-02-26 DIAGNOSIS — R350 Frequency of micturition: Secondary | ICD-10-CM | POA: Diagnosis not present

## 2013-02-26 DIAGNOSIS — R309 Painful micturition, unspecified: Secondary | ICD-10-CM

## 2013-02-26 LAB — POCT WET PREP WITH KOH
Clue Cells Wet Prep HPF POC: NEGATIVE
KOH Prep POC: NEGATIVE
TRICHOMONAS UA: NEGATIVE
WBC Wet Prep HPF POC: NEGATIVE
Yeast Wet Prep HPF POC: NEGATIVE

## 2013-02-26 LAB — POCT URINALYSIS DIPSTICK
Blood, UA: NEGATIVE
Ketones, UA: 15
Leukocytes, UA: NEGATIVE
Nitrite, UA: NEGATIVE
Protein, UA: NEGATIVE
Spec Grav, UA: 1.025
Urobilinogen, UA: 0.2
pH, UA: 5

## 2013-02-26 LAB — POCT UA - MICROSCOPIC ONLY
Casts, Ur, LPF, POC: NEGATIVE
Crystals, Ur, HPF, POC: NEGATIVE
Mucus, UA: POSITIVE
RBC, urine, microscopic: NEGATIVE
Yeast, UA: NEGATIVE

## 2013-02-26 MED ORDER — CIPROFLOXACIN HCL 500 MG PO TABS: 500.0000 mg | ORAL_TABLET | Freq: Two times a day (BID) | ORAL | Status: DC

## 2013-02-26 NOTE — Patient Instructions (Signed)
Urinary Tract Infection  Urinary tract infections (UTIs) can develop anywhere along your urinary tract. Your urinary tract is your body's drainage system for removing wastes and extra water. Your urinary tract includes two kidneys, two ureters, a bladder, and a urethra. Your kidneys are a pair of bean-shaped organs. Each kidney is about the size of your fist. They are located below your ribs, one on each side of your spine.  CAUSES  Infections are caused by microbes, which are microscopic organisms, including fungi, viruses, and bacteria. These organisms are so small that they can only be seen through a microscope. Bacteria are the microbes that most commonly cause UTIs.  SYMPTOMS   Symptoms of UTIs may vary by age and gender of the patient and by the location of the infection. Symptoms in young women typically include a frequent and intense urge to urinate and a painful, burning feeling in the bladder or urethra during urination. Older women and men are more likely to be tired, shaky, and weak and have muscle aches and abdominal pain. A fever may mean the infection is in your kidneys. Other symptoms of a kidney infection include pain in your back or sides below the ribs, nausea, and vomiting.  DIAGNOSIS  To diagnose a UTI, your caregiver will ask you about your symptoms. Your caregiver also will ask to provide a urine sample. The urine sample will be tested for bacteria and white blood cells. White blood cells are made by your body to help fight infection.  TREATMENT   Typically, UTIs can be treated with medication. Because most UTIs are caused by a bacterial infection, they usually can be treated with the use of antibiotics. The choice of antibiotic and length of treatment depend on your symptoms and the type of bacteria causing your infection.  HOME CARE INSTRUCTIONS   If you were prescribed antibiotics, take them exactly as your caregiver instructs you. Finish the medication even if you feel better after you  have only taken some of the medication.   Drink enough water and fluids to keep your urine clear or pale yellow.   Avoid caffeine, tea, and carbonated beverages. They tend to irritate your bladder.   Empty your bladder often. Avoid holding urine for long periods of time.   Empty your bladder before and after sexual intercourse.   After a bowel movement, women should cleanse from front to back. Use each tissue only once.  SEEK MEDICAL CARE IF:    You have back pain.   You develop a fever.   Your symptoms do not begin to resolve within 3 days.  SEEK IMMEDIATE MEDICAL CARE IF:    You have severe back pain or lower abdominal pain.   You develop chills.   You have nausea or vomiting.   You have continued burning or discomfort with urination.  MAKE SURE YOU:    Understand these instructions.   Will watch your condition.   Will get help right away if you are not doing well or get worse.  Document Released: 10/10/2004 Document Revised: 07/02/2011 Document Reviewed: 02/08/2011  [employer] Patient Information 2014 [employer]

## 2013-02-26 NOTE — Progress Notes (Addendum)
Subjective:    Patient ID: Kayla [name], female    DOB: 27-May-1945, 68 y.o.   MRN: [id number] This chart was scribed for Wardell Honour, MD by Anastasia Pall, ED Scribe. This patient was seen in room 10 and the patient's care was started at 6:35 PM.  Chief Complaint  Patient presents with  . Painful Urination    X 1 week  . Increased frequency urination    X 1 week  . Back Pain    Lower, X 1 week  . Vaginal Odor    X 2 weeks   HPI Kayla [name] is a 68 y.o. female Pt reports intermittent, bilateral pelvic pain, lower back pain, and urinary frequency, onset 1 week ago. She also reports a vaginal odor onset 2 weeks ago. She reports there have been a few times where she noticed her urine was pink, but denies seeing blood. She denies burning with urination, vaginal discharge. She does not know if her symptoms are from renal problem, her h/o UTI, or her h/o multiple surgeries. She reports having 22 surgeries over her abdomen. Pt had surgery in 1999 for new navel, had complications with surgery. Pt has h/o cholecystectomy, hysterectomy, hiatal hernia surgery. Pt states she saw Dr. [name] week or 2 ago for medication refill. She sees Dr. Darylene Price.  PCP - Leandrew Koyanagi, MD  Patient Active Problem List   Diagnosis Date Noted  . Chronic maxillary sinusitis 09/15/2013  . Personal history of colonic polyps 07/22/2013  . HA (headache)-chronic/tension type 07/28/2012  . Osteoporosis, unspecified 07/28/2012  . Unspecified vitamin D deficiency-2009 07/28/2012  . Nephrolithiasis 07/28/2012  . Fibromyalgia 07/26/2012  . Chronic interstitial cystitis 10/16/2011  . Chest pain 12/21/2010  . Chronic constipation 10/04/2010  . WEIGHT LOSS 11/27/2009  . PERSONAL HISTORY OF FAILED MODERATE SEDATION 06/01/2008  . ANXIETY 03/18/2007  . DEPRESSION 03/18/2007  . GERD 03/18/2007  . HIATAL HERNIA 03/18/2007  . OSTEOARTHRITIS 03/18/2007  . [place] DISEASE, CERVICAL 03/18/2007  . [place] DISEASE,  LUMBAR 03/18/2007  . FIBROMYALGIA 03/18/2007   Past Medical History  Diagnosis Date  . MVP (mitral valve prolapse)   . Barrett esophagus     not consistently present  . History of kidney stones   . Anxiety   . IBS (irritable bowel syndrome)   . Complication of anesthesia     either  BP or pulse dropped with last surgery  . Angina     takes Propanolol prn and it stops her chest  . Chronic kidney disease     kidney stones and UTI  . Depression     post traumatic stress  disorder  . Hypertension     3 small brain aneurysms  . GERD (gastroesophageal reflux disease)   . DDD (degenerative disc disease)     cervical and lumbar  . Osteoarthritis     all over  . Headache(784.0)     migraines  . Dysrhythmia     brought on by stress  . Peripheral vascular disease     superficial phlebitis in left calf  . Diabetes mellitus     sugar goes up and down diet controlled  . Anemia     anemia in the past  . Fibromyalgia     neuropathy knees ankles and toes, bladder  . Hiatal hernia   . H/O hyperparathyroidism   . Osteopetrosis   . Allergy   . Osteoporosis   . Dementia   . Macular degeneration   .  Fatigue   . Headache   . Migraines   . Tachycardia   . Personal history of colonic polyps 07/22/2013    07/2013 - 3 small adenomas - repeat colonoscopy 2018   Past Surgical History  Procedure Laterality Date  . US echocardiography  01/21/2007    EF 55-60%  . US echocardiography  08/30/2003    EF 55-60%  . Cardiovascular stress test  10/03/2006  . Esophagus surgery    . Gastric bypass  1999  . Appendectomy    . Cholecystectomy    . Abdominal hysterectomy    . Gastric restriction surgery  1991  . Tubal ligation    . Right knee arthroscopy    . Right arm surgery    . Upper gastrointestinal endoscopy  06/23/2008    w/Dil, Barrett's esophagus  . Colonoscopy  06/23/2008    normal  . Tonsillectomy    . Cardiac catheterization  03/22/1991    EF 61%  . Stomach stappeling  1991  .  Dilation and curettage of uterus      x2  . Exploratory laparotomy  1993  . Stone extraction with basket  2012  . Rt wrist fx  2009   Allergies  Allergen Reactions  . Codeine Anaphylaxis  . Oxycodone-Acetaminophen Shortness Of Breath  . Tylox [Oxycodone-Acetaminophen] Anaphylaxis    Chest pain  . Darvocet [Propoxyphene N-Acetaminophen]     Chest pain  . Darvon [Propoxyphene] Itching  . Food Swelling    Peanuts-wheat-  . Lorcet [Hydrocodone-Acetaminophen]     Chest pain  . Opium     hallucinations  . Wheat Bran   . Zolpidem Tartrate Other (See Comments)    Makes her go crazy  . Zolpidem Tartrate     hallucinations   Prior to Admission medications   Medication Sig Start Date End Date Taking? Authorizing Provider  Ascorbic Acid (VITAMIN C) 1000 MG tablet Take 1,000 mg by mouth daily.   Yes Historical Provider, MD  butalbital-acetaminophen-caffeine (FIORICET, ESGIC) 50-325-40 MG per tablet Take 1 tablet by mouth 3 (three) times daily. Headache 02/08/13  Yes Leandrew Koyanagi, MD  CALCIUM-MAGNESIUM-ZINC PO Take by mouth daily.   Yes Historical Provider, MD  Cholecalciferol (VITAMIN D3) 2000 UNITS TABS Take 2,000 Units by mouth daily.   Yes Historical Provider, MD  clonazePAM (KLONOPIN) 1 MG tablet Take 0.5-1 tablets (0.5-1 mg total) by mouth 2 (two) times daily as needed. anxiety/panic attacks 02/08/13  Yes Leandrew Koyanagi, MD  diclofenac (VOLTAREN) 25 MG EC tablet Take 1 tablet (25 mg total) by mouth 2 (two) times daily. As needed for pain 02/16/12  Yes Gay Filler Copland, MD  diclofenac sodium (VOLTAREN) 1 % GEL Apply 2 g topically 4 (four) times daily as needed. For pain. 02/08/13  Yes Leandrew Koyanagi, MD  dicyclomine (BENTYL) 10 MG capsule Take 1-2 tab 3 times daily as needed for spasms and cramping. 02/08/13  Yes Leandrew Koyanagi, MD  erythromycin ophthalmic ointment Place 0.5 application into both eyes at bedtime.    Yes Historical Provider, MD  esomeprazole (NEXIUM) 40 MG  capsule Take 1 capsule (40 mg total) by mouth daily before breakfast. 02/08/13  Yes Leandrew Koyanagi, MD  fentaNYL (DURAGESIC - DOSED MCG/HR) 75 MCG/HR Place 1 patch onto the skin every 3 (three) days.   Yes Historical Provider, MD  Flaxseed, Linseed, (FLAX SEED OIL) 1000 MG CAPS Take 1,000 mg by mouth daily.   Yes Historical Provider, MD  fluticasone (FLONASE) 50 MCG/ACT  nasal spray 2 sprays each nostril hs. May do 3 months at a time 02/08/13  Yes Leandrew Koyanagi, MD  HYDROcodone-acetaminophen ([place]) 7.5-325 MG per tablet Take 1 tablet by mouth every 6 (six) hours as needed. Pain    Yes Historical Provider, MD  lidocaine (LIDODERM) 5 % Place 1 patch onto the skin as needed. Remove & Discard patch within 12 hours. May dispense as 3 month supply 02/08/13  Yes Leandrew Koyanagi, MD  Omega-3 Fatty Acids (SEA-OMEGA PO) Take 1,000 mg by mouth daily.   Yes Historical Provider, MD  polyethylene glycol powder (GLYCOLAX/MIRALAX) powder TAKE 17 GRAMS BY MOUTH DAILY IN JUICE 02/21/12  Yes Gatha Mayer, MD  pyridoxine (B-6) 200 MG tablet Take 200 mg by mouth daily.   Yes Historical Provider, MD  traZODone (DESYREL) 150 MG tablet Take 1 tablet (150 mg total) by mouth at bedtime. 02/08/13  Yes Leandrew Koyanagi, MD  zoster vaccine live, PF, (ZOSTAVAX) 09811 UNT/0.65ML injection Inject 19,400 Units into the skin once. 02/08/13  Yes Leandrew Koyanagi, MD   History   Social History  . Marital Status: Married    Spouse Name: Rushie Chestnut.    Number of Children: 2  . Years of Education: N/A   Occupational History  . Retired   .     Social History Main Topics  . Smoking status: Never Smoker   . Smokeless tobacco: Never Used  . Alcohol Use: No  . Drug Use: No  . Sexual Activity: Not Currently   Other Topics Concern  . Not on file   Social History Narrative  . No narrative on file    Review of Systems  Constitutional: Negative for fever, chills, diaphoresis and fatigue.  Gastrointestinal: Negative  for nausea, vomiting, abdominal pain, diarrhea, constipation, blood in stool, abdominal distention, anal bleeding and rectal pain.  Genitourinary: Positive for frequency, hematuria and pelvic pain (bilateral). Negative for dysuria, urgency, flank pain, vaginal bleeding, vaginal discharge and vaginal pain.       + for vaginal odor  Musculoskeletal: Positive for back pain (lower).      Objective:   Physical Exam  Constitutional: She is oriented to person, place, and time. She appears well-developed and well-nourished. No distress.  HENT:  Head: Normocephalic and atraumatic.  Eyes: Conjunctivae and EOM are normal. Pupils are equal, round, and reactive to light.  Neck: Normal range of motion. Neck supple.  Cardiovascular: Normal rate, regular rhythm and normal heart sounds.  Exam reveals no gallop and no friction rub.   No murmur heard. Pulmonary/Chest: Effort normal and breath sounds normal. No respiratory distress. She has no wheezes. She has no rales.  Abdominal: Soft. Bowel sounds are normal. She exhibits no distension and no mass. There is no tenderness. There is no rebound and no guarding.  Genitourinary: There is no rash, tenderness or lesion on the right labia. There is no rash, tenderness or lesion on the left labia. No erythema or bleeding in the vagina. No foreign body around the vagina. No signs of injury around the vagina. Vaginal discharge found.  Musculoskeletal: Normal range of motion.  Neurological: She is alert and oriented to person, place, and time.  Skin: Skin is warm and dry. She is not diaphoretic.  Psychiatric: She has a normal mood and affect. Her behavior is normal.  Nursing note and vitals reviewed.  BP 90/68  Pulse 83  Temp(Src) 98.5 F (36.9 C) (Oral)  Resp 16  Ht 5' 5.5" (1.664 m)  Wt 109 lb 3.2 oz (49.533 kg)  BMI 17.89 kg/m2  SpO2 98%   Results for orders placed or performed in visit on 02/26/13  Urine culture  Result Value Ref Range   Colony Count NO  GROWTH    Organism ID, Bacteria NO GROWTH   POCT UA - Microscopic Only  Result Value Ref Range   WBC, Ur, HPF, POC 0-4    RBC, urine, microscopic Neg    Bacteria, U Microscopic 4+    Mucus, UA Pos    Epithelial cells, urine per micros TNTC    Crystals, Ur, HPF, POC neg    Casts, Ur, LPF, POC neg    Yeast, UA neg   POCT urinalysis dipstick  Result Value Ref Range   Color, UA yellow    Clarity, UA hazy    Glucose, UA eng    Bilirubin, UA small    Ketones, UA 15    Spec Grav, UA 1.025    Blood, UA neg    pH, UA 5.0    Protein, UA neg    Urobilinogen, UA 0.2    Nitrite, UA neg    Leukocytes, UA Negative   POCT Wet Prep with KOH  Result Value Ref Range   Trichomonas, UA Negative    Clue Cells Wet Prep HPF POC neg    Epithelial Wet Prep HPF POC 0-4    Yeast Wet Prep HPF POC neg    Bacteria Wet Prep HPF POC 1+    RBC Wet Prep HPF POC 2-4    WBC Wet Prep HPF POC neg    KOH Prep POC Negative        Assessment & Plan:   1. Painful urination   2. Increased frequency of urination     1. Dysuria: New.  Send urine culture; treat empirically with Cipro.  RTC fever, vomiting, flank pain.   Meds ordered this encounter  Medications  . DISCONTD: ciprofloxacin (CIPRO) 500 MG tablet    Sig: Take 1 tablet (500 mg total) by mouth 2 (two) times daily.    Dispense:  14 tablet    Refill:  0    I personally performed the services described in this documentation, which was scribed in my presence.  The recorded information has been reviewed and is accurate.  Reginia Forts, M.D.  [facility] [address] [place], [place]  [address] [phone] phone [phone] fax

## 2013-02-28 LAB — URINE CULTURE
Colony Count: NO GROWTH
Organism ID, Bacteria: NO GROWTH

## 2013-03-09 ENCOUNTER — Encounter: Payer: Self-pay | Admitting: Family Medicine

## 2013-03-29 ENCOUNTER — Ambulatory Visit (INDEPENDENT_AMBULATORY_CARE_PROVIDER_SITE_OTHER): Payer: Medicare Other | Admitting: Surgery

## 2013-04-07 DIAGNOSIS — M5380 Other specified dorsopathies, site unspecified: Secondary | ICD-10-CM | POA: Diagnosis not present

## 2013-04-07 DIAGNOSIS — IMO0001 Reserved for inherently not codable concepts without codable children: Secondary | ICD-10-CM | POA: Diagnosis not present

## 2013-04-07 DIAGNOSIS — Z5181 Encounter for therapeutic drug level monitoring: Secondary | ICD-10-CM | POA: Diagnosis not present

## 2013-04-07 DIAGNOSIS — Z79899 Other long term (current) drug therapy: Secondary | ICD-10-CM | POA: Diagnosis not present

## 2013-04-20 ENCOUNTER — Telehealth: Payer: Self-pay

## 2013-04-20 DIAGNOSIS — R519 Headache, unspecified: Secondary | ICD-10-CM

## 2013-04-20 DIAGNOSIS — R51 Headache: Principal | ICD-10-CM

## 2013-04-20 NOTE — Telephone Encounter (Signed)
Pt requesting refill on headache meds and for any other refills that might be due?? Please send to [facility] phone [phone]

## 2013-04-20 NOTE — Telephone Encounter (Signed)
Pt has an appt with Dr. [name] 5/6 Pt needs Fiorcet for sure, but as far as everything else, she will call us in the morning to let us know for sure what she doesn't have enough of

## 2013-04-21 ENCOUNTER — Telehealth: Payer: Self-pay

## 2013-04-21 DIAGNOSIS — R519 Headache, unspecified: Secondary | ICD-10-CM

## 2013-04-21 DIAGNOSIS — R51 Headache: Principal | ICD-10-CM

## 2013-04-21 MED ORDER — BUTALBITAL-APAP-CAFFEINE 50-325-40 MG PO TABS: 1.0000 | ORAL_TABLET | Freq: Three times a day (TID) | ORAL | Status: DC

## 2013-04-21 NOTE — Telephone Encounter (Signed)
6mo supply ok

## 2013-04-21 NOTE — Telephone Encounter (Signed)
Patient called stated she need urgent medical to send her refills to [facility], Fosamax, Vesacre, Fioricet 50-325 40 mg

## 2013-04-23 ENCOUNTER — Telehealth: Payer: Self-pay

## 2013-04-23 DIAGNOSIS — G43909 Migraine, unspecified, not intractable, without status migrainosus: Secondary | ICD-10-CM | POA: Diagnosis not present

## 2013-04-23 DIAGNOSIS — G43119 Migraine with aura, intractable, without status migrainosus: Secondary | ICD-10-CM | POA: Diagnosis not present

## 2013-04-23 DIAGNOSIS — R413 Other amnesia: Secondary | ICD-10-CM | POA: Diagnosis not present

## 2013-04-23 DIAGNOSIS — R930 Abnormal findings on diagnostic imaging of skull and head, not elsewhere classified: Secondary | ICD-10-CM | POA: Diagnosis not present

## 2013-04-23 NOTE — Telephone Encounter (Signed)
Patient states that her glucose levels are "going crazy" again. She states that a couple of nights ago her glucose dropped down into the 40's. She wants to know if it will be ok for her to get an epi-pen from her pharmacy to use in case of an emergency. Please return call and advise. She also wants to know if Dr. [name] can prescribe diet pills for her daughter. Patient states she will be bringing her daughter in to see him in a few weeks and if he cannot prescribe the diet pills she would like to find another doctor to take her daughter to.

## 2013-04-23 NOTE — Telephone Encounter (Signed)
I do not see any prev Rx for Fosamax. I have pended Rx as normally dosed but LMOM for CB to verify w/pt before sending Rx.

## 2013-04-25 NOTE — Telephone Encounter (Signed)
1) If sugars are going that low patient needs to be evaluated.   2) Epi Pen has nothing to do with blood sugars. Patient needs to discuss this with her PCP. Please give them ER precautions.   3) I cannot speak for Dr. [name], but I do not believe any of the providers here write for diet pills. I suggest the patient look into healthy ways of losing weight such as eating right and exercise. We can set them up with a nutritionist.

## 2013-04-25 NOTE — Telephone Encounter (Signed)
Pt understand she does not need an Epi Pen. She is going to come in on Tuesday to see Dr. [name].  She states she is having trouble with the diet control for her "diabetes" She eats and her sugar is around 460 then a few hours later it will drop to 40-60. She becomes lethargic and dizzy. She sometimes does not have memory of where she is or memory of what had happened after.    Her daughter is going to come in to see Dr. [name] on Friday. She is actually looking for medication to help with her compulsive disorder.

## 2013-04-26 NOTE — Telephone Encounter (Signed)
LMOM again for pt CB.

## 2013-04-30 ENCOUNTER — Ambulatory Visit (INDEPENDENT_AMBULATORY_CARE_PROVIDER_SITE_OTHER): Payer: Medicare Other | Admitting: Internal Medicine

## 2013-04-30 VITALS — BP 104/60 | HR 83 | Temp 99.3°F | Resp 16 | Wt 108.0 lb

## 2013-04-30 DIAGNOSIS — R51 Headache: Principal | ICD-10-CM

## 2013-04-30 DIAGNOSIS — F3289 Other specified depressive episodes: Secondary | ICD-10-CM | POA: Diagnosis not present

## 2013-04-30 DIAGNOSIS — R634 Abnormal weight loss: Secondary | ICD-10-CM | POA: Diagnosis not present

## 2013-04-30 DIAGNOSIS — IMO0001 Reserved for inherently not codable concepts without codable children: Secondary | ICD-10-CM | POA: Diagnosis not present

## 2013-04-30 DIAGNOSIS — R519 Headache, unspecified: Secondary | ICD-10-CM

## 2013-04-30 DIAGNOSIS — F329 Major depressive disorder, single episode, unspecified: Secondary | ICD-10-CM

## 2013-04-30 DIAGNOSIS — F411 Generalized anxiety disorder: Secondary | ICD-10-CM | POA: Diagnosis not present

## 2013-04-30 MED ORDER — BUTALBITAL-APAP-CAFFEINE 50-325-40 MG PO TABS: 1.0000 | ORAL_TABLET | Freq: Three times a day (TID) | ORAL | Status: DC

## 2013-04-30 MED ORDER — CICLOPIROX 8 % EX SOLN: Freq: Every day | CUTANEOUS | Status: DC

## 2013-04-30 MED ORDER — DESIPRAMINE HCL 75 MG PO TABS: ORAL_TABLET | ORAL | Status: DC

## 2013-04-30 MED ORDER — TRAZODONE HCL 150 MG PO TABS: ORAL_TABLET | ORAL | Status: DC

## 2013-04-30 NOTE — Progress Notes (Signed)
Subjective:     Patient ID: Kayla [name], female   DOB: 1945-02-02, 68 y.o.   MRN: [id number]  Headache  Pertinent negatives include no coughing, fever or seizures.  Hyperglycemia Associated symptoms include arthralgias, congestion, fatigue and headaches. Pertinent negatives include no coughing or fever.   actually her sugar related problems have never been specifically identified. Certainly no diabetes. No evidence for insulinoma. Perhaps reactive hypoglycemia is a possibility. Hyperglycemia has never been identified with Labs except with her home glucose checks.  68 yr female here for follow-up on memory problems and headaches.  States last week she tried to buy her own car at a lot the other week.  Did not remember her own car.  Is constantly getting lost.  Does not remember conversations with people.   Sees [name] in neurology did not change medications  MRI is waiting to be scheduled .  Needs refill of fiorecet.  Dr. Kerry Hough will no longer see her because she has canceled so many apts.  Was scared to go incase she was dx with alzhemiers and didn't want to be put in a home.   Dr. [name] sent her to see nutritionist.  Still continues to lose weight and cannot afford the diet suggested.    Daily glucose measurements range from 400-40.  Diabetic doctor did not offer must. Now has glucose gel and tablets incase of lows due to pharmacist advice.   Would like to go over medications.   Trazadone is not lasting through the night. waking in the middle of the night and needs   Stopped taking antidepressants (wellbutrin) 4 mths ago because it was making her cry all the time.    Has a fungal infection on her left pinky fingernail.      Review of Systems  Constitutional: Positive for appetite change, fatigue and unexpected weight change. Negative for fever.  HENT: Positive for congestion. Negative for nosebleeds and sneezing.   Eyes: Positive for itching.  Respiratory: Negative for cough  and shortness of breath.   Musculoskeletal: Positive for arthralgias. Negative for gait problem.  Neurological: Positive for headaches. Negative for seizures and syncope.  Psychiatric/Behavioral: Positive for confusion and sleep disturbance.       Objective:   Physical Exam  Constitutional: She is oriented to person, place, and time. She appears cachectic. She has a sickly appearance ( But this is unchanged from the past). She appears distressed.  HENT:  Head: Normocephalic and atraumatic.  Neck: Normal range of motion.  Cardiovascular: Normal rate and regular rhythm.  Exam reveals no gallop and no friction rub.   No murmur heard. Pulmonary/Chest: Effort normal. No respiratory distress. She has no wheezes. She has no rales.  Abdominal: Soft. Bowel sounds are normal.  Lymphadenopathy:    She has no cervical adenopathy.  Neurological: She is alert and oriented to person, place, and time. No cranial nerve deficit.  Skin: Skin is warm and dry.  Left fifth fingernail with extensive fungus       Assessment:  HA (headache) - Plan: butalbital-acetaminophen-caffeine (FIORICET, ESGIC) 50-325-40 MG per tablet  WEIGHT LOSS  FIBROMYALGIA  DEPRESSION  ANXIETY  Meds ordered this encounter  Medications  . ciclopirox (PENLAC) 8 % solution    Sig: Apply topically at bedtime. Apply over nail and surrounding skin. Apply daily over previous coat. After seven (7) days, may remove with alcohol and continue cycle.    Dispense:  6.6 mL    Refill:  2    Send  3 mo supply  . butalbital-acetaminophen-caffeine (FIORICET, ESGIC) 50-325-40 MG per tablet    Sig: Take 1 tablet by mouth 3 (three) times daily. Headache    Dispense:  90 tablet    Refill:  2    To [facility] [address]  . DISCONTD: desipramine (NORPRAMIN) 75 MG tablet    Sig: 1/2 tab at 4am to add to regular dose at bedtime    Dispense:  45 tablet    Refill:  3  . traZODone (DESYREL) 150 MG tablet    Sig: 1/2 tab at 4am prn    Dispense:  45  tablet    Refill:  3       Plan:  1. meds are refilled that needed refilling by mail-order and locally  2. she will continue followup with her specialists

## 2013-05-10 ENCOUNTER — Telehealth: Payer: Self-pay

## 2013-05-10 NOTE — Telephone Encounter (Signed)
Pt LM on my VM reporting that she has been using Penlac solution as prescribed and the area surrounding skin has become quite red and a little swollen. Pt just wanted to check to see if this is normal or if she should stop application. Checked w/Heather who advised that it is listed as a common reaction. LMOM for pt that I will send message to Dr [name] for instr's and advised that she may stop application until she hears back from [place].

## 2013-05-11 ENCOUNTER — Encounter: Payer: Self-pay | Admitting: Internal Medicine

## 2013-05-11 NOTE — Telephone Encounter (Signed)
PT CALLED AND STATED SHE MISSED OUR CALL. PLEASE CALL BACK AT [phone]

## 2013-05-11 NOTE — Telephone Encounter (Signed)
correct 

## 2013-05-12 NOTE — Telephone Encounter (Signed)
Spoke with patient.  Advised her that some redness and swelling is normal but should not be painful.  She states that it is not hurting her today and that she will come in to see him if symptoms worsen.

## 2013-05-12 NOTE — Telephone Encounter (Signed)
Verified w/Dr [name] that he advises that some redness/swelling normal w/treatment, but if worsens or if it is painful she should DC and let us know.

## 2013-05-19 ENCOUNTER — Ambulatory Visit: Payer: Medicare Other | Admitting: Internal Medicine

## 2013-05-20 ENCOUNTER — Ambulatory Visit (INDEPENDENT_AMBULATORY_CARE_PROVIDER_SITE_OTHER): Payer: Medicare Other | Admitting: Emergency Medicine

## 2013-05-20 VITALS — BP 100/68 | HR 91 | Temp 98.6°F | Resp 16 | Ht 65.0 in | Wt 106.4 lb

## 2013-05-20 DIAGNOSIS — N3000 Acute cystitis without hematuria: Secondary | ICD-10-CM | POA: Diagnosis not present

## 2013-05-20 DIAGNOSIS — R350 Frequency of micturition: Secondary | ICD-10-CM | POA: Diagnosis not present

## 2013-05-20 LAB — POCT URINALYSIS DIPSTICK
BILIRUBIN UA: NEGATIVE
Glucose, UA: NEGATIVE
Nitrite, UA: NEGATIVE
SPEC GRAV UA: 1.02
UROBILINOGEN UA: 0.2
pH, UA: 5

## 2013-05-20 LAB — POCT UA - MICROSCOPIC ONLY
Casts, Ur, LPF, POC: NEGATIVE
MUCUS UA: NEGATIVE
Yeast, UA: NEGATIVE

## 2013-05-20 MED ORDER — CIPROFLOXACIN HCL 500 MG PO TABS: 500.0000 mg | ORAL_TABLET | Freq: Two times a day (BID) | ORAL | Status: DC

## 2013-05-20 MED ORDER — PHENAZOPYRIDINE HCL 200 MG PO TABS: 200.0000 mg | ORAL_TABLET | Freq: Three times a day (TID) | ORAL | Status: DC | PRN

## 2013-05-20 NOTE — Patient Instructions (Signed)
Urinary Tract Infection  Urinary tract infections (UTIs) can develop anywhere along your urinary tract. Your urinary tract is your body's drainage system for removing wastes and extra water. Your urinary tract includes two kidneys, two ureters, a bladder, and a urethra. Your kidneys are a pair of bean-shaped organs. Each kidney is about the size of your fist. They are located below your ribs, one on each side of your spine.  CAUSES  Infections are caused by microbes, which are microscopic organisms, including fungi, viruses, and bacteria. These organisms are so small that they can only be seen through a microscope. Bacteria are the microbes that most commonly cause UTIs.  SYMPTOMS   Symptoms of UTIs may vary by age and gender of the patient and by the location of the infection. Symptoms in young women typically include a frequent and intense urge to urinate and a painful, burning feeling in the bladder or urethra during urination. Older women and men are more likely to be tired, shaky, and weak and have muscle aches and abdominal pain. A fever may mean the infection is in your kidneys. Other symptoms of a kidney infection include pain in your back or sides below the ribs, nausea, and vomiting.  DIAGNOSIS  To diagnose a UTI, your caregiver will ask you about your symptoms. Your caregiver also will ask to provide a urine sample. The urine sample will be tested for bacteria and white blood cells. White blood cells are made by your body to help fight infection.  TREATMENT   Typically, UTIs can be treated with medication. Because most UTIs are caused by a bacterial infection, they usually can be treated with the use of antibiotics. The choice of antibiotic and length of treatment depend on your symptoms and the type of bacteria causing your infection.  HOME CARE INSTRUCTIONS   If you were prescribed antibiotics, take them exactly as your caregiver instructs you. Finish the medication even if you feel better after you  have only taken some of the medication.   Drink enough water and fluids to keep your urine clear or pale yellow.   Avoid caffeine, tea, and carbonated beverages. They tend to irritate your bladder.   Empty your bladder often. Avoid holding urine for long periods of time.   Empty your bladder before and after sexual intercourse.   After a bowel movement, women should cleanse from front to back. Use each tissue only once.  SEEK MEDICAL CARE IF:    You have back pain.   You develop a fever.   Your symptoms do not begin to resolve within 3 days.  SEEK IMMEDIATE MEDICAL CARE IF:    You have severe back pain or lower abdominal pain.   You develop chills.   You have nausea or vomiting.   You have continued burning or discomfort with urination.  MAKE SURE YOU:    Understand these instructions.   Will watch your condition.   Will get help right away if you are not doing well or get worse.  Document Released: 10/10/2004 Document Revised: 07/02/2011 Document Reviewed: 02/08/2011  [employer] Patient Information 2014 [employer]

## 2013-05-20 NOTE — Progress Notes (Signed)
[facility] [address], [place] [address] [phone]  Date:  05/20/2013   Name:  Kayla [name]   DOB:  09/10/1945   MRN:  [id number]  PCP:  Leandrew Koyanagi, MD    Chief Complaint: Urinary Frequency   History of Present Illness:  Kayla [name] is a 68 y.o. very pleasant female patient who presents with the following:  History of dysuria, urgency and frequency.  Has hematuria last day or so. No fever or chills.  No abdominal pain or flank pain.  Frequent UTI"s in past.  No improvement with over the counter medications or other home remedies. Denies other complaint or health concern today.   Patient Active Problem List   Diagnosis Date Noted  . HA (headache)-chronic/tension type 07/28/2012  . Osteoporosis, unspecified 07/28/2012  . Unspecified vitamin D deficiency-2009 07/28/2012  . Nephrolithiasis 07/28/2012  . Fibromyalgia 07/26/2012  . Chronic interstitial cystitis 10/16/2011  . Chest pain 12/21/2010  . Chronic constipation 10/04/2010  . WEIGHT LOSS 11/27/2009  . PERSONAL HISTORY OF FAILED MODERATE SEDATION 06/01/2008  . ANXIETY 03/18/2007  . DEPRESSION 03/18/2007  . GERD 03/18/2007  . HIATAL HERNIA 03/18/2007  . OSTEOARTHRITIS 03/18/2007  . [place] DISEASE, CERVICAL 03/18/2007  . [place] DISEASE, LUMBAR 03/18/2007  . FIBROMYALGIA 03/18/2007    Past Medical History  Diagnosis Date  . MVP (mitral valve prolapse)   . Barrett esophagus     not consistently present  . History of kidney stones   . Anxiety   . IBS (irritable bowel syndrome)   . Complication of anesthesia     either  BP or pulse dropped with last surgery  . Angina     takes Propanolol prn and it stops her chest  . Chronic kidney disease     kidney stones and UTI  . Depression     post traumatic stress  disorder  . Hypertension     3 small brain aneurysms  . GERD (gastroesophageal reflux disease)   . DDD (degenerative disc disease)     cervical and lumbar  .  Osteoarthritis     all over  . Headache(784.0)     migraines  . Dysrhythmia     brought on by stress  . Peripheral vascular disease     superficial phlebitis in left calf  . Diabetes mellitus     sugar goes up and down diet controlled  . Anemia     anemia in the past  . Fibromyalgia     neuropathy knees ankles and toes, bladder  . Hiatal hernia   . H/O hyperparathyroidism   . Osteopetrosis     Past Surgical History  Procedure Laterality Date  . US echocardiography  01/21/2007    EF 55-60%  . US echocardiography  08/30/2003    EF 55-60%  . Cardiovascular stress test  10/03/2006  . Esophagus surgery    . Gastric bypass  1999  . Appendectomy    . Cholecystectomy    . Abdominal hysterectomy    . Gastric restriction surgery  1991  . Tubal ligation    . Right knee arthroscopy    . Right arm surgery    . Upper gastrointestinal endoscopy  06/23/2008    w/Dil, Barrett's esophagus  . Colonoscopy  06/23/2008    normal  . Tonsillectomy    . Cardiac catheterization  03/22/1991    EF 61%  . Stomach stappeling  1991  . Dilation and curettage of uterus  x2  . Exploratory laparotomy  1993  . Stone extraction with basket  2012  . Rt wrist fx  2009    History  Substance Use Topics  . Smoking status: Never Smoker   . Smokeless tobacco: Never Used  . Alcohol Use: No    Family History  Problem Relation Age of Onset  . Stroke Mother   . Lung cancer Mother   . Heart disease Mother   . Diabetes Mother   . Hypertension Father   . Stroke Father   . Lung cancer Father   . Heart disease Father   . Kidney disease Father   . Seizures Father     epilepsy, and sisters x 2  . Pancreatic cancer Sister   . Lung cancer Sister   . Colon cancer Maternal Aunt     12 relatives  . Uterine cancer      aunt  . Heart disease      grandmother  . Clotting disorder Sister   . Heart disease Sister     x 2  . Kidney disease Sister     x 2  . Breast cancer Sister   . Colon cancer  Paternal Aunt     Allergies  Allergen Reactions  . Ambien [Zolpidem Tartrate] Anaphylaxis  . Codeine Anaphylaxis  . Oxycodone-Acetaminophen Shortness Of Breath  . Acetaminophen-Codeine Other (See Comments)    Shaky-chest feels heavy  . Darvocet [Propoxyphene N-Acetaminophen]   . Food Swelling    Peanuts-wheat-  . Lorcet [Hydrocodone-Acetaminophen]   . Opium     hallucinations  . Oxycodone-Acetaminophen Other (See Comments)    Chest pain , nausea , pass out  . Tylox [Oxycodone-Acetaminophen]     Chest pain  . Zolpidem Tartrate Other (See Comments)    Makes her go crazy  . Zolpidem Tartrate     hallucinations    Medication list has been reviewed and updated.  Current Outpatient Prescriptions on File Prior to Visit  Medication Sig Dispense Refill  . Ascorbic Acid (VITAMIN C) 1000 MG tablet Take 1,000 mg by mouth daily.      . butalbital-acetaminophen-caffeine (FIORICET, ESGIC) 50-325-40 MG per tablet Take 1 tablet by mouth 3 (three) times daily. Headache  90 tablet  2  . CALCIUM-MAGNESIUM-ZINC PO Take by mouth daily.      . Cholecalciferol (VITAMIN D3) 2000 UNITS TABS Take 2,000 Units by mouth daily.      . ciclopirox (PENLAC) 8 % solution Apply topically at bedtime. Apply over nail and surrounding skin. Apply daily over previous coat. After seven (7) days, may remove with alcohol and continue cycle.  6.6 mL  2  . ciprofloxacin (CIPRO) 500 MG tablet Take 1 tablet (500 mg total) by mouth 2 (two) times daily.  14 tablet  0  . clonazePAM (KLONOPIN) 1 MG tablet Take 0.5-1 tablets (0.5-1 mg total) by mouth 2 (two) times daily as needed. anxiety/panic attacks  180 tablet  1  . diclofenac (VOLTAREN) 25 MG EC tablet Take 1 tablet (25 mg total) by mouth 2 (two) times daily. As needed for pain  180 tablet  1  . diclofenac sodium (VOLTAREN) 1 % GEL Apply 2 g topically 4 (four) times daily as needed. For pain.  100 g  6  . dicyclomine (BENTYL) 10 MG capsule Take 1-2 tab 3 times daily as  needed for spasms and cramping.  270 capsule  3  . erythromycin ophthalmic ointment Place 0.5 application into both eyes at bedtime.       Marland Kitchen  esomeprazole (NEXIUM) 40 MG capsule Take 1 capsule (40 mg total) by mouth daily before breakfast.  90 capsule  3  . fentaNYL (DURAGESIC - DOSED MCG/HR) 75 MCG/HR Place 1 patch onto the skin every 3 (three) days.      . Flaxseed, Linseed, (FLAX SEED OIL) 1000 MG CAPS Take 1,000 mg by mouth daily.      . fluticasone (FLONASE) 50 MCG/ACT nasal spray 2 sprays each nostril hs. May do 3 months at a time  16 g  11  . HYDROcodone-acetaminophen (NORCO) 7.5-325 MG per tablet Take 1 tablet by mouth every 6 (six) hours as needed. Pain       . lidocaine (LIDODERM) 5 % Place 1 patch onto the skin as needed. Remove & Discard patch within 12 hours. May dispense as 3 month supply  30 patch  11  . Omega-3 Fatty Acids (SEA-OMEGA PO) Take 1,000 mg by mouth daily.      . polyethylene glycol powder (GLYCOLAX/MIRALAX) powder TAKE 17 GRAMS BY MOUTH DAILY IN JUICE  527 g  2  . pyridoxine (B-6) 200 MG tablet Take 200 mg by mouth daily.      . traZODone (DESYREL) 150 MG tablet Take 1 tablet (150 mg total) by mouth at bedtime.  90 tablet  3  . traZODone (DESYREL) 150 MG tablet 1/2 tab at 4am prn  45 tablet  3  . zoster vaccine live, PF, (ZOSTAVAX) [address] UNT/0.65ML injection Inject 19,400 Units into the skin once.  1 each  0  . [DISCONTINUED] buPROPion (WELLBUTRIN) 75 MG tablet Take 75 mg by mouth daily after breakfast.       . [DISCONTINUED] solifenacin (VESICARE) 5 MG tablet Take 1 tablet (5 mg total) by mouth daily.  90 tablet  3   No current facility-administered medications on file prior to visit.    Review of Systems:  As per HPI, otherwise negative.    Physical Examination: Filed Vitals:   05/20/13 1552  BP: 100/68  Pulse: 91  Temp: 98.6 F (37 C)  Resp: 16   Filed Vitals:   05/20/13 1552  Height: 5\' 5"  (1.651 m)  Weight: 106 lb 6.4 oz (48.263 kg)   Body mass  index is 17.71 kg/(m^2). Ideal Body Weight: Weight in (lb) to have BMI = 25: 149.9  GEN: WDWN, NAD, Non-toxic, A & O x 3 HEENT: Atraumatic, Normocephalic. Neck supple. No masses, No LAD. Ears and Nose: No external deformity. CV: RRR, No M/G/R. No JVD. No thrill. No extra heart sounds. PULM: CTA B, no wheezes, crackles, rhonchi. No retractions. No resp. distress. No accessory muscle use. ABD: S, NT, ND, +BS. No rebound. No HSM. EXTR: No c/c/e NEURO Normal gait.  PSYCH: Normally interactive. Conversant. Not depressed or anxious appearing.  Calm demeanor.    Assessment and Plan: Acute cystitis  Pyridium cipro  Signed,  Ellison Carwin, MD  Results for orders placed in visit on 05/20/13  POCT URINALYSIS DIPSTICK      Result Value Ref Range   Color, UA yellow     Clarity, UA cloudy     Glucose, UA neg     Bilirubin, UA neg     Ketones, UA trace     Spec Grav, UA 1.020     Blood, UA large     pH, UA 5.0     Protein, UA trace     Urobilinogen, UA 0.2     Nitrite, UA neg     Leukocytes, UA moderate (2+)  POCT UA - MICROSCOPIC ONLY      Result Value Ref Range   WBC, Ur, HPF, POC tntc     RBC, urine, microscopic 10-15     Bacteria, U Microscopic trace     Mucus, UA neg     Epithelial cells, urine per micros 0-1     Crystals, Ur, HPF, POC calcium oxalate     Casts, Ur, LPF, POC neg     Yeast, UA neg

## 2013-05-25 DIAGNOSIS — F4310 Post-traumatic stress disorder, unspecified: Secondary | ICD-10-CM | POA: Diagnosis not present

## 2013-05-25 DIAGNOSIS — J329 Chronic sinusitis, unspecified: Secondary | ICD-10-CM | POA: Diagnosis not present

## 2013-05-25 DIAGNOSIS — G3184 Mild cognitive impairment, so stated: Secondary | ICD-10-CM | POA: Diagnosis not present

## 2013-05-25 DIAGNOSIS — R413 Other amnesia: Secondary | ICD-10-CM | POA: Diagnosis not present

## 2013-06-16 DIAGNOSIS — G3184 Mild cognitive impairment, so stated: Secondary | ICD-10-CM | POA: Diagnosis not present

## 2013-06-22 DIAGNOSIS — G3184 Mild cognitive impairment, so stated: Secondary | ICD-10-CM | POA: Diagnosis not present

## 2013-06-23 ENCOUNTER — Encounter: Payer: Medicare Other | Admitting: Internal Medicine

## 2013-06-25 ENCOUNTER — Telehealth: Payer: Self-pay

## 2013-06-25 NOTE — Telephone Encounter (Signed)
Correct. She has 150 mg tablets to take one at bedtime and half tablet when necessary if she wakes early. If she were to take one tablet at bedtime and 1 tablet at 4 AM this would be too much with this medication

## 2013-06-25 NOTE — Telephone Encounter (Signed)
Fax from [facility] asking for new Rx w/new directions and qty to "take 1 at 4 am and 1 tab at bedtime" #180. Dr [name], the only mention I see of this med in EPIC is at last OV 04/30/13 you Rxd it for pt at 1/2 tab at 4am to add to regular dose at bedtime. Please advise.

## 2013-06-28 ENCOUNTER — Telehealth: Payer: Self-pay

## 2013-06-28 ENCOUNTER — Encounter: Payer: Self-pay | Admitting: Internal Medicine

## 2013-06-28 ENCOUNTER — Ambulatory Visit (INDEPENDENT_AMBULATORY_CARE_PROVIDER_SITE_OTHER): Payer: Medicare Other | Admitting: Internal Medicine

## 2013-06-28 VITALS — HR 92 | Temp 98.1°F | Resp 18 | Ht 64.5 in | Wt 105.0 lb

## 2013-06-28 DIAGNOSIS — K449 Diaphragmatic hernia without obstruction or gangrene: Secondary | ICD-10-CM

## 2013-06-28 DIAGNOSIS — H5710 Ocular pain, unspecified eye: Secondary | ICD-10-CM

## 2013-06-28 DIAGNOSIS — R634 Abnormal weight loss: Secondary | ICD-10-CM

## 2013-06-28 DIAGNOSIS — IMO0001 Reserved for inherently not codable concepts without codable children: Secondary | ICD-10-CM

## 2013-06-28 DIAGNOSIS — R519 Headache, unspecified: Secondary | ICD-10-CM

## 2013-06-28 DIAGNOSIS — B351 Tinea unguium: Secondary | ICD-10-CM

## 2013-06-28 DIAGNOSIS — M5137 Other intervertebral disc degeneration, lumbosacral region: Secondary | ICD-10-CM

## 2013-06-28 DIAGNOSIS — R300 Dysuria: Secondary | ICD-10-CM

## 2013-06-28 DIAGNOSIS — F411 Generalized anxiety disorder: Secondary | ICD-10-CM

## 2013-06-28 DIAGNOSIS — M810 Age-related osteoporosis without current pathological fracture: Secondary | ICD-10-CM

## 2013-06-28 DIAGNOSIS — M5030 Other cervical disc degeneration, unspecified cervical region: Secondary | ICD-10-CM

## 2013-06-28 DIAGNOSIS — F3289 Other specified depressive episodes: Secondary | ICD-10-CM

## 2013-06-28 DIAGNOSIS — R51 Headache: Secondary | ICD-10-CM

## 2013-06-28 DIAGNOSIS — F329 Major depressive disorder, single episode, unspecified: Secondary | ICD-10-CM

## 2013-06-28 DIAGNOSIS — K219 Gastro-esophageal reflux disease without esophagitis: Secondary | ICD-10-CM

## 2013-06-28 DIAGNOSIS — N3010 Interstitial cystitis (chronic) without hematuria: Secondary | ICD-10-CM

## 2013-06-28 DIAGNOSIS — N390 Urinary tract infection, site not specified: Secondary | ICD-10-CM

## 2013-06-28 DIAGNOSIS — J329 Chronic sinusitis, unspecified: Secondary | ICD-10-CM

## 2013-06-28 DIAGNOSIS — M1990 Unspecified osteoarthritis, unspecified site: Secondary | ICD-10-CM

## 2013-06-28 DIAGNOSIS — R413 Other amnesia: Secondary | ICD-10-CM

## 2013-06-28 LAB — POCT UA - MICROSCOPIC ONLY
CASTS, UR, LPF, POC: NEGATIVE
CRYSTALS, UR, HPF, POC: NEGATIVE
Epithelial cells, urine per micros: NEGATIVE
Mucus, UA: NEGATIVE
Yeast, UA: NEGATIVE

## 2013-06-28 LAB — POCT URINALYSIS DIPSTICK
Glucose, UA: NEGATIVE
Leukocytes, UA: NEGATIVE
Nitrite, UA: NEGATIVE
Protein, UA: NEGATIVE
Spec Grav, UA: 1.015
UROBILINOGEN UA: 1
pH, UA: 5.5

## 2013-06-28 MED ORDER — CIPROFLOXACIN HCL 500 MG PO TABS: 500.0000 mg | ORAL_TABLET | Freq: Two times a day (BID) | ORAL | Status: DC

## 2013-06-28 NOTE — Telephone Encounter (Signed)
Pt returned call from [facility], I read message left for her, Pt said she understands, and taking 1/2 a tab as needed will be fine for her, she will be in to see Dr.Doolittle today
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
## 2013-06-28 NOTE — Telephone Encounter (Signed)
Notified pharm of the correct dosage. Also LMOM for Pt that she is not to take another whole tablet at 4 am in addition to the one at bedtime, may only take another 1/2 tab if needed.

## 2013-06-28 NOTE — Patient Instructions (Signed)
[facility] [place], [place] [address]  Dr Yehuda Budd   [phone]

## 2013-06-29 ENCOUNTER — Telehealth: Payer: Self-pay

## 2013-06-29 DIAGNOSIS — H04129 Dry eye syndrome of unspecified lacrimal gland: Secondary | ICD-10-CM | POA: Diagnosis not present

## 2013-06-29 DIAGNOSIS — H02059 Trichiasis without entropian unspecified eye, unspecified eyelid: Secondary | ICD-10-CM | POA: Diagnosis not present

## 2013-06-29 MED ORDER — CLONAZEPAM 1 MG PO TABS: 0.5000 mg | ORAL_TABLET | Freq: Two times a day (BID) | ORAL | Status: DC | PRN

## 2013-06-29 MED ORDER — TRAZODONE HCL 150 MG PO TABS: 150.0000 mg | ORAL_TABLET | Freq: Every day | ORAL | Status: DC

## 2013-06-29 NOTE — Telephone Encounter (Signed)
DOOLITTLE - Pt saw you yesterday and you referred her.  The number on the referral sheet is a private number for the doctor and she will call again tomorrow after she finds the number on the intrenet.  Pt found out at eye doctor that she has macular degeneration so bad that she will eventually go blind.  She also has to have cataract surgery due to the cataracts having grown so large.  Her colonoscopy is scheduled next week.  She just wanted to update you

## 2013-06-30 ENCOUNTER — Telehealth: Payer: Self-pay

## 2013-06-30 DIAGNOSIS — F3289 Other specified depressive episodes: Secondary | ICD-10-CM | POA: Diagnosis not present

## 2013-06-30 DIAGNOSIS — R519 Headache, unspecified: Secondary | ICD-10-CM

## 2013-06-30 DIAGNOSIS — F329 Major depressive disorder, single episode, unspecified: Secondary | ICD-10-CM | POA: Diagnosis not present

## 2013-06-30 DIAGNOSIS — M5030 Other cervical disc degeneration, unspecified cervical region: Secondary | ICD-10-CM | POA: Diagnosis not present

## 2013-06-30 DIAGNOSIS — R51 Headache: Principal | ICD-10-CM

## 2013-06-30 DIAGNOSIS — IMO0001 Reserved for inherently not codable concepts without codable children: Secondary | ICD-10-CM | POA: Diagnosis not present

## 2013-06-30 NOTE — Telephone Encounter (Signed)
Message copied by Dallas Schimke on Wed Jun 30, 2013  4:46 PM ------      Message from: Leandrew Koyanagi      Created: Tue Jun 29, 2013  1:16 PM       Please call--she requested fioricet refill and [facility] be [facility] ------

## 2013-06-30 NOTE — Telephone Encounter (Signed)
Pt stated she wants the fioricet also sent to [place]. She only gets one sent locally if she doesn't have [place] to get through [facility].   Pt wanted to update Dr [name]: pt saw her pain MD, Dr Joellen Jersey (sp?) and he doesn't feel it's safe for her to drive anymore. He suspended license for 2 mos and will recheck, may take permanently at that time. He also Rxd a shower chair and walker to prevent falls. Dietician he referred to set up appt for end of July, but has her on waiting list for sooner appt if possible. Pt will have colonoscopy next week and will have those results and MRI results sent to you.

## 2013-07-01 ENCOUNTER — Ambulatory Visit (AMBULATORY_SURGERY_CENTER): Payer: Self-pay | Admitting: *Deleted

## 2013-07-01 ENCOUNTER — Telehealth: Payer: Self-pay | Admitting: *Deleted

## 2013-07-01 VITALS — Ht 65.0 in | Wt 107.0 lb

## 2013-07-01 DIAGNOSIS — Z1211 Encounter for screening for malignant neoplasm of colon: Secondary | ICD-10-CM

## 2013-07-01 MED ORDER — NA SULFATE-K SULFATE-MG SULF 17.5-3.13-1.6 GM/177ML PO SOLN: 1.0000 | Freq: Once | ORAL | Status: DC

## 2013-07-01 MED ORDER — BUTALBITAL-APAP-CAFFEINE 50-325-40 MG PO TABS: 1.0000 | ORAL_TABLET | Freq: Three times a day (TID) | ORAL | Status: DC

## 2013-07-01 NOTE — Telephone Encounter (Signed)
OK for direct  Dysphagia is chronic  Have her take 4 dulcolax and 4 glasses of MiraLax day before prep day  Thanks

## 2013-07-01 NOTE — Telephone Encounter (Signed)
Dr Carlean Purl:  Pt is scheduled for direct screening colonoscopy 07/13/13.  Last colonoscopy 2005 with Dr Inocente Salles.  Several problems were talked about in [place]:  1.  Pt is having dysphagia with food and pills daily; does she need EGD? 2.  Pt says that she is having rectal pain and abdominal pain.  (May have been addressed in [place] with you 06/18/12.) 3.  Chronic constipation: has to manually remove stool with BM.  (Does she need 2 day prep?) 4.  New diagnosis of dementia  (husband present at [facility])  Is she okay for direct colonoscopy or do you want OV before scheduling colonoscopy?  Thanks, Juliann Pulse

## 2013-07-01 NOTE — Progress Notes (Signed)
No allergies to eggs or soy. No problems with anesthesia.  Pt given Emmi instructions for colonoscopy  No oxygen use  No diet drug use  

## 2013-07-01 NOTE — Telephone Encounter (Signed)
Ordered Meds ordered this encounter  Medications  . butalbital-acetaminophen-caffeine (FIORICET, ESGIC) 50-325-40 MG per tablet    Sig: Take 1 tablet by mouth 3 (three) times daily. Headache    Dispense:  90 tablet    Refill:  3

## 2013-07-02 NOTE — Telephone Encounter (Signed)
Rx faxed

## 2013-07-05 NOTE — Telephone Encounter (Signed)
Pt instructed to take 4 Dulcolax tablets and 4 glasses of Miralax 2 days before procedure. Pt also aware that only colonoscopy needed at this time.

## 2013-07-07 ENCOUNTER — Ambulatory Visit: Payer: Medicare Other | Admitting: Dietician

## 2013-07-12 ENCOUNTER — Telehealth: Payer: Self-pay | Admitting: Internal Medicine

## 2013-07-12 NOTE — Telephone Encounter (Signed)
Follow through with Suprep today as planned

## 2013-07-12 NOTE — Telephone Encounter (Addendum)
Patient has been doing mirilax and dulculax prep as ordered.  Patient has only gone once during the night.  Patient stated that there was not a lot.  States that she only eats one meal per day.  She stated that she had a gastric bypass that didn't work.  Stated that she could only eat a little bit, and then she got cramps.    She is having a colonoscopy tomorrow.  She finished her first prep and will take the next at 6pm tonight and then in the am.  Dr. Carlean Purl stated to do the Suprep as directed.   I called the patient and she agreed. I told her that if she hadn't gone by 10pm to call the doctor on call to see what he/she wanted her to do at that time just in case.  She agreed.

## 2013-07-13 ENCOUNTER — Encounter: Payer: Self-pay | Admitting: Internal Medicine

## 2013-07-13 ENCOUNTER — Ambulatory Visit (AMBULATORY_SURGERY_CENTER): Payer: Medicare Other | Admitting: Internal Medicine

## 2013-07-13 VITALS — BP 137/67 | HR 87 | Temp 98.7°F | Resp 16 | Ht 65.0 in | Wt 107.0 lb

## 2013-07-13 DIAGNOSIS — Z1211 Encounter for screening for malignant neoplasm of colon: Secondary | ICD-10-CM | POA: Diagnosis not present

## 2013-07-13 DIAGNOSIS — Z800 Family history of malignant neoplasm of digestive organs: Secondary | ICD-10-CM | POA: Diagnosis not present

## 2013-07-13 DIAGNOSIS — D126 Benign neoplasm of colon, unspecified: Secondary | ICD-10-CM

## 2013-07-13 LAB — GLUCOSE, CAPILLARY: Glucose-Capillary: 105 mg/dL — ABNORMAL HIGH (ref 70–99)

## 2013-07-13 MED ORDER — SODIUM CHLORIDE 0.9 % IV SOLN: 500.0000 mL | INTRAVENOUS | Status: DC

## 2013-07-13 NOTE — Progress Notes (Signed)
A/ox3 pleased with MAC, report to Karen RN 

## 2013-07-13 NOTE — Op Note (Signed)
[place]  [address] [place], [address]   COLONOSCOPY PROCEDURE REPORT  PATIENT: Kayla [name], Kayla [name]  MR#: [id number] BIRTHDATE: [birth year] , 65  yrs. old GENDER: Female ENDOSCOPIST: Gatha Mayer, MD, [facility] PROCEDURE DATE:  07/13/2013 PROCEDURE:   Colonoscopy with snare polypectomy First Screening Colonoscopy - Avg.  risk and is 50 yrs.  old or older - No.  Prior Negative Screening - Now for repeat screening. Above average risk  History of Adenoma - Now for follow-up colonoscopy & has been > or = to 3 yrs.  N/A  Polyps Removed Today? Yes. ASA CLASS:   Class II INDICATIONS:elevated risk screening and Patient's immediate family history of colon cancer. MEDICATIONS: propofol (Diprivan) 300mg  IV, MAC sedation, administered by CRNA, and These medications were titrated to patient response per physician's verbal order  DESCRIPTION OF PROCEDURE:   After the risks benefits and alternatives of the procedure were thoroughly explained, informed consent was obtained.  A digital rectal exam revealed no abnormalities of the rectum.   The LB [id number]-H190 [id number]  endoscope was introduced through the anus and advanced to the cecum, which was identified by both the appendix and ileocecal valve. No adverse events experienced.   The quality of the prep was 2 day prep Suprep good  The instrument was then slowly withdrawn as the colon was fully examined.  COLON FINDINGS: Three sessile polyps measuring 4, 5 and 6 mm in size were found at the cecum, in the transverse colon, and sigmoid colon.  A polypectomy was performed with a cold snare.  The resection was complete and the polyp tissue was completely retrieved.   The colon was redundant.  Manual abdominal counter-pressure was used to reach the cecum.   The colon mucosa was otherwise normal.  Retroflexed views revealed internal hemorrhoids. The time to cecum=5 minutes 22 seconds.  Withdrawal time=13 minutes 26 seconds.  The scope  was withdrawn and the procedure completed. COMPLICATIONS: There were no complications.  ENDOSCOPIC IMPRESSION: 1.   Three sessile polyps measuring 4, 5 and 6 mm in size were found at the cecum, in the transverse colon, and sigmoid colon; polypectomy was performed with a cold snare 2.   The colon was redundant 3.   The colon mucosa was otherwise normal - good prep  RECOMMENDATIONS: Timing of repeat colonoscopy will be determined by pathology findings.   eSigned:  Gatha Mayer, MD, [facility] 07/13/2013 3:04 PM   cc: The Patient

## 2013-07-13 NOTE — Patient Instructions (Addendum)
I found and removed 3 polyps that look benign.  I will let you know pathology results and when to have another routine colonoscopy by mail.  I could not find a high protein diet but suggest you try protein powder with your foods and drinking Boost/Ensure or Instant Breakfast  I appreciate the opportunity to care for you. Gatha Mayer, MD, FACG  YOU HAD AN ENDOSCOPIC PROCEDURE TODAY AT [facility]: Refer to the procedure report that was given to you for any specific questions about what was found during the examination.  If the procedure report does not answer your questions, please call your gastroenterologist to clarify.  If you requested that your care partner not be given the details of your procedure findings, then the procedure report has been included in a sealed envelope for you to review at your convenience later.  YOU SHOULD EXPECT: Some feelings of bloating in the abdomen. Passage of more gas than usual.  Walking can help get rid of the air that was put into your GI tract during the procedure and reduce the bloating. If you had a lower endoscopy (such as a colonoscopy or flexible sigmoidoscopy) you may notice spotting of blood in your stool or on the toilet paper. If you underwent a bowel prep for your procedure, then you may not have a normal bowel movement for a few days.  DIET: Your first meal following the procedure should be a light meal and then it is ok to progress to your normal diet.  A half-sandwich or bowl of soup is an example of a good first meal.  Heavy or fried foods are harder to digest and may make you feel nauseous or bloated.  Likewise meals heavy in dairy and vegetables can cause extra gas to form and this can also increase the bloating.  Drink plenty of fluids but you should avoid alcoholic beverages for 24 hours.  ACTIVITY: Your care partner should take you home directly after the procedure.  You should plan to take it easy, moving slowly for the  rest of the day.  You can resume normal activity the day after the procedure however you should NOT DRIVE or use heavy machinery for 24 hours (because of the sedation medicines used during the test).    SYMPTOMS TO REPORT IMMEDIATELY: A gastroenterologist can be reached at any hour.  During normal business hours, 8:30 AM to 5:00 PM Monday through Friday, call [phone].  After hours and on weekends, please call the GI answering service at [phone] who will take a message and have the physician on call contact you.   Following lower endoscopy (colonoscopy or flexible sigmoidoscopy):  Excessive amounts of blood in the stool  Significant tenderness or worsening of abdominal pains  Swelling of the abdomen that is new, acute  Fever of 100F or higher  FOLLOW UP: If any biopsies were taken you will be contacted by phone or by letter within the next 1-3 weeks.  Call your gastroenterologist if you have not heard about the biopsies in 3 weeks.  Our staff will call the home number listed on your records the next business day following your procedure to check on you and address any questions or concerns that you may have at that time regarding the information given to you following your procedure. This is a courtesy call and so if there is no answer at the home number and we have not heard from you through the emergency physician on  call, we will assume that you have returned to your regular daily activities without incident.  SIGNATURES/CONFIDENTIALITY: You and/or your care partner have signed paperwork which will be entered into your electronic medical record.  These signatures attest to the fact that that the information above on your After Visit Summary has been reviewed and is understood.  Full responsibility of the confidentiality of this discharge information lies with you and/or your care-partner.

## 2013-07-13 NOTE — Progress Notes (Signed)
Called to room to assist during endoscopic procedure.  Patient ID and intended procedure confirmed with present staff. Received instructions for my participation in the procedure from the performing physician.  

## 2013-07-14 ENCOUNTER — Telehealth: Payer: Self-pay | Admitting: *Deleted

## 2013-07-14 NOTE — Telephone Encounter (Signed)
No answer, left message to call if questions or concerns. 

## 2013-07-15 ENCOUNTER — Telehealth: Payer: Self-pay

## 2013-07-15 NOTE — Telephone Encounter (Signed)
Patient called and wants Dr. [name] to know 1) after her last appt with him, she went to her pain managament Doctor in [facility] and he took her license from her and made her give it to her husband. 2) She also went to her eye doctor and was diagnosed with moderate macular degeneration 3) She was also diagnosed with pseduodimensia. She has several issues to speak with Dr. [name] about and wants to know if there is another extensive center where she can receive psychiatric counseling and testing for ADD/ADHD, etc that accepts insurance. Please return call and advise. Thank you.

## 2013-07-16 NOTE — Telephone Encounter (Signed)
Pt has already called Dr. Barbie Banner and they do not accept her insurance. She is going to call [facility] today to find out who she may go to.

## 2013-07-16 NOTE — Telephone Encounter (Signed)
Dr Tamala Ser for his office in [place]---he also has one in [place] If they don't take her insurance she can call [facility] for directions about who in community would do this for her

## 2013-07-20 DIAGNOSIS — H04129 Dry eye syndrome of unspecified lacrimal gland: Secondary | ICD-10-CM | POA: Diagnosis not present

## 2013-07-22 ENCOUNTER — Encounter: Payer: Self-pay | Admitting: Internal Medicine

## 2013-07-22 ENCOUNTER — Other Ambulatory Visit: Payer: Self-pay | Admitting: Internal Medicine

## 2013-07-22 DIAGNOSIS — Z860100 Personal history of colon polyps, unspecified: Secondary | ICD-10-CM

## 2013-07-22 HISTORY — DX: Personal history of colonic polyps: Z86.010

## 2013-07-22 HISTORY — DX: Personal history of colon polyps, unspecified: Z86.0100

## 2013-07-22 NOTE — Progress Notes (Signed)
Quick Note:  3 small adenomas Repeat colonoscopy 2018 ______

## 2013-08-02 ENCOUNTER — Telehealth: Payer: Self-pay

## 2013-08-02 DIAGNOSIS — Z1211 Encounter for screening for malignant neoplasm of colon: Secondary | ICD-10-CM

## 2013-08-02 NOTE — Telephone Encounter (Signed)
Lost her license  And [facility] is to far away.  Husband won't take her .   Needs a female Proofreader.   Colonoscopy took off three pileups - precancerous  Repeat three years - don't want to wait - she wants another one sooner. (Every year)  [phone]  Dr [name]

## 2013-08-03 NOTE — Telephone Encounter (Signed)
LM for rtn call-  Reviewed path report- states the polyps were benign. She should follow up per Dr. Celesta Aver advice. (3 years)   Names of counselors to give patient: Joelene Millin- [phone] [facility] [id number] Darra Lis [phone] Marya Amsler- [phone]

## 2013-08-04 NOTE — Telephone Encounter (Signed)
lmom for pt to cb

## 2013-08-04 NOTE — Telephone Encounter (Signed)
Pt CB and stated that she is just not comfortable w/waiting 3 yrs for f/up colonoscopy w/her hx of precancerous polyps and extensive fam hx of cancer. She would like a referral for a second opinion to another GI, Dr Gracelyn Nurse in [phone], fx [phone] to verify whether they agree that she doesn't need more often. I am starting referral for pt. Gave pt names of counselors and she agreed to call them. Dr [name], Jul[facility] Rainier.

## 2013-08-05 ENCOUNTER — Other Ambulatory Visit: Payer: Self-pay | Admitting: Internal Medicine

## 2013-08-05 DIAGNOSIS — Z803 Family history of malignant neoplasm of breast: Secondary | ICD-10-CM

## 2013-08-05 DIAGNOSIS — Z1231 Encounter for screening mammogram for malignant neoplasm of breast: Secondary | ICD-10-CM

## 2013-08-06 NOTE — Telephone Encounter (Signed)
Ok to refer for second opinion to her location of choice.

## 2013-08-11 ENCOUNTER — Ambulatory Visit: Payer: Medicare Other | Admitting: Dietician

## 2013-08-11 NOTE — Telephone Encounter (Signed)
Pt called in and wants to let Dr [name] know that she has a second opinion consultation scheduled for Aug 25 with Dr Liane Comber in [place]. She will update [place] after appt. She was also wanting [place] to call and get her an appt for a counselor due to the fact that she called four different ones and no one has gotten back to her. She needs to speak with someone and since she has not had any luck thought we could call and make the appt. She wants it to see someone who is compassionate. She also wants to know if she can have a script for a laxative to take on a regular basis. The Mirilax in not working she is still only going once a month. She has a relative that takes on on a regular basis and she thinks it starts with an A. Thank you

## 2013-08-13 MED ORDER — POLYETHYLENE GLYCOL 3350 17 GM/SCOOP PO POWD: ORAL | Status: DC

## 2013-08-13 NOTE — Telephone Encounter (Signed)
Pt called back to say the Miralax is finally working! Please send in a refill to the [facility]

## 2013-08-13 NOTE — Telephone Encounter (Signed)
She should call [facility]--Julie Whitt altho they may prefer for her to call, we can call to be sure insurance fits

## 2013-08-13 NOTE — Telephone Encounter (Signed)
Refill has been sent in.  

## 2013-08-16 ENCOUNTER — Telehealth: Payer: Self-pay

## 2013-08-16 MED ORDER — POLYETHYLENE GLYCOL 3350 17 GM/SCOOP PO POWD: ORAL | Status: DC

## 2013-08-16 NOTE — Telephone Encounter (Signed)
Pt called to have prescription for polyethylene glycol sent to [facility]

## 2013-08-16 NOTE — Addendum Note (Signed)
Addended by: Jethro Bolus A on: 08/16/2013 02:27 PM   Modules accepted: Orders

## 2013-08-16 NOTE — Telephone Encounter (Signed)
Advised pt we could put coupons in the mail for Mega Red. I have mailed these. Transferred pt to scheduling to make an appt.

## 2013-08-16 NOTE — Telephone Encounter (Signed)
Dr. [name],   Patient weights 98 to 100 pounds, has no energy.  Stays in bed most of the day.  Wants to know if she can put [facility] (salesclerk at mall suggested) in her protein drink.  Also, looking for coupons for fish oil Mega Red Kell (not sure of spelling)  It was advertised on TV over the weekend.  [phone]

## 2013-08-17 DIAGNOSIS — H04129 Dry eye syndrome of unspecified lacrimal gland: Secondary | ICD-10-CM | POA: Diagnosis not present

## 2013-08-17 DIAGNOSIS — R51 Headache: Secondary | ICD-10-CM | POA: Diagnosis not present

## 2013-08-17 DIAGNOSIS — H532 Diplopia: Secondary | ICD-10-CM | POA: Diagnosis not present

## 2013-08-17 DIAGNOSIS — H2510 Age-related nuclear cataract, unspecified eye: Secondary | ICD-10-CM | POA: Diagnosis not present

## 2013-08-17 DIAGNOSIS — H02059 Trichiasis without entropian unspecified eye, unspecified eyelid: Secondary | ICD-10-CM | POA: Diagnosis not present

## 2013-08-18 ENCOUNTER — Telehealth: Payer: Self-pay

## 2013-08-18 DIAGNOSIS — F411 Generalized anxiety disorder: Secondary | ICD-10-CM

## 2013-08-18 DIAGNOSIS — K2100 Gastro-esophageal reflux disease with esophagitis, without bleeding: Secondary | ICD-10-CM

## 2013-08-18 DIAGNOSIS — G44009 Cluster headache syndrome, unspecified, not intractable: Secondary | ICD-10-CM

## 2013-08-18 DIAGNOSIS — K449 Diaphragmatic hernia without obstruction or gangrene: Secondary | ICD-10-CM

## 2013-08-18 DIAGNOSIS — K3189 Other diseases of stomach and duodenum: Secondary | ICD-10-CM

## 2013-08-18 DIAGNOSIS — M1990 Unspecified osteoarthritis, unspecified site: Secondary | ICD-10-CM

## 2013-08-18 NOTE — Telephone Encounter (Signed)
PT STATES SHE NEED HER MEDS TO BE SENT TO [facility], NEED A 90 DAY OR 1 YEAR SUPPLY ONE IS THE CYMBALTA 30MG S AND VOLTAREN 1% GEL AND 2 MORE MEDS PLEASE CALL [phone]

## 2013-08-19 MED ORDER — BUTALBITAL-APAP-CAFFEINE 50-325-40 MG PO TABS: 1.0000 | ORAL_TABLET | Freq: Three times a day (TID) | ORAL | Status: DC

## 2013-08-19 MED ORDER — ESOMEPRAZOLE MAGNESIUM 40 MG PO CPDR: 40.0000 mg | DELAYED_RELEASE_CAPSULE | Freq: Every day | ORAL | Status: DC

## 2013-08-19 MED ORDER — DICYCLOMINE HCL 10 MG PO CAPS: ORAL_CAPSULE | ORAL | Status: DC

## 2013-08-19 MED ORDER — FLUTICASONE PROPIONATE 50 MCG/ACT NA SUSP: NASAL | Status: DC

## 2013-08-19 MED ORDER — DULOXETINE HCL 30 MG PO CPEP: 30.0000 mg | ORAL_CAPSULE | Freq: Every day | ORAL | Status: DC

## 2013-08-19 MED ORDER — GABAPENTIN 300 MG PO CAPS: 300.0000 mg | ORAL_CAPSULE | Freq: Three times a day (TID) | ORAL | Status: DC

## 2013-08-19 MED ORDER — TRAZODONE HCL 150 MG PO TABS: 150.0000 mg | ORAL_TABLET | Freq: Every day | ORAL | Status: DC

## 2013-08-19 MED ORDER — DICLOFENAC SODIUM 1 % TD GEL: 2.0000 g | Freq: Four times a day (QID) | TRANSDERMAL | Status: DC | PRN

## 2013-08-19 NOTE — Telephone Encounter (Signed)
Notified pt Rxs were ready to be faxed and verified Rxs being sent. Pt stated that she also needs a RF of her Voltaren gel. She also reported that Dr Derwood Kaplan wanted her back on the citalopram and gave her 1 mos Rx to cover her until Dr [name] could send in for the year to [facility]. We have Rxd as recently as earlier this year, but wasn't sure if OK to refill? Pended both.

## 2013-08-19 NOTE — Telephone Encounter (Signed)
Pt needs 90 day supply with 3 refills sent to her new pharamcy- [facility]. Pt states she has lost her drivers license due to new dx of macular degeneration and glaucoma.  Pt has an appt in September for follow up with Dr. [name].

## 2013-08-19 NOTE — Telephone Encounter (Signed)
Sent in meds 

## 2013-08-20 MED ORDER — DULOXETINE HCL 30 MG PO CPEP: 30.0000 mg | ORAL_CAPSULE | Freq: Every day | ORAL | Status: DC

## 2013-08-20 MED ORDER — DICLOFENAC SODIUM 1 % TD GEL: 2.0000 g | Freq: Four times a day (QID) | TRANSDERMAL | Status: DC | PRN

## 2013-08-20 NOTE — Addendum Note (Signed)
Addended by: Elwyn Reach A on: 08/20/2013 12:16 PM   Modules accepted: Orders

## 2013-08-20 NOTE — Telephone Encounter (Signed)
Ok for both

## 2013-08-20 NOTE — Telephone Encounter (Signed)
Reprinted both cymbalta and voltaren gel in Heather's name so they can be faxed to [facility].

## 2013-08-20 NOTE — Telephone Encounter (Signed)
Heather signed and I faxed to [facility]

## 2013-08-23 ENCOUNTER — Encounter: Payer: Medicare Other | Attending: Internal Medicine | Admitting: Dietician

## 2013-08-23 ENCOUNTER — Encounter: Payer: Self-pay | Admitting: Dietician

## 2013-08-23 VITALS — Ht 65.0 in | Wt 105.7 lb

## 2013-08-23 DIAGNOSIS — R634 Abnormal weight loss: Secondary | ICD-10-CM | POA: Diagnosis not present

## 2013-08-23 DIAGNOSIS — Z681 Body mass index (BMI) 19 or less, adult: Secondary | ICD-10-CM | POA: Diagnosis not present

## 2013-08-23 DIAGNOSIS — Z713 Dietary counseling and surveillance: Secondary | ICD-10-CM | POA: Diagnosis not present

## 2013-08-23 DIAGNOSIS — K912 Postsurgical malabsorption, not elsewhere classified: Secondary | ICD-10-CM | POA: Insufficient documentation

## 2013-08-23 NOTE — Patient Instructions (Addendum)
-  Continue to keep glucose tablets or juice box on hand at all times  -Take 2 tablets, wait 15 minutes. If you still feel shaky take 2 more tablets.  -When you feel better, have a snack or meal with protein  -Have a protein food with every meal and snack  -Meat, boiled eggs, peanut butter, nuts and seeds, milk, full-fat yogurt   -Avoid skipping meals  -Try to have something to eat every 3 hours  -Healthy fats  -Nuts, seeds, nut butters, olive oil, salmon, canola oil, hummus  -Foods to help with Macular Degeneration  -Sweet potato, pumpkin, orange bell pepper, carrots, cantaloupe, squash (have these foods with some type of fat)  -Dark green leafy veggies  -Get a sublingual form of vitamin B12

## 2013-08-23 NOTE — Progress Notes (Signed)
  Medical Nutrition Therapy:  Appt start time: 1400 end time:  1500.   Assessment:  Primary concerns today: Kayla [name] is here today as she was referred for weight loss. She had a RYGB in 1999 (per patient, for Barrett's esophagus) and it "tangled her intestines." Was sent home with a feeding tube and drainage tube. She later underwent emergency surgery to repair her intestines. She reports that her MD wants her to be 135 lbs. She currently weighs 105 lbs. Kayla [name] reports that she has a very supportive husband. In the past she has tried Ensure, and it did not work. Just started whey protein shakes 2 weeks ago, cannot remember the name. Reports very little energy and frequently has difficulty getting out of bed. Does not have dumping syndrome or diarrhea. However, sometimes can go 1 month without a bowel movement. Severe hypoglycemia, per patient due to too many carbs at one time. "Chronically dehydrated." Sometimes skips meals.   Preferred Learning Style:  No preference indicated   Learning Readiness:   Ready  MEDICATIONS: see list   DIETARY INTAKE:  Avoided foods include red meat, most cheese, alcohol, artificial sweeteners, shellfish.    24-hr recall:  B ( AM): bagel with bacon and scrambled eggs  Snk ( AM):   L ( PM): sometimes skips Snk ( PM): 2 protein shakes with fruit and whole milk D ( PM): loaded baked potato or salad with chicken and boiled eggs or TV dinner Snk ( PM): pretzels or peanuts or almonds  Beverages: coffee, Gatorade, 2 Pepsis a day, Sprite, V8 juice, milk, water  Usual physical activity: none  Estimated energy needs: 1800 calories 200 g carbohydrates 135 g protein 50 g fat  Progress Towards Goal(s):  In progress.   Nutritional Diagnosis:  Unintentional weight loss As related to "botched" RYGB in 1999 causing malabsortion as evidenced by BMI 17.6 and difficulty gaining weight per patient report.     Intervention:  Nutrition counseling provided. -Continue to  keep glucose tablets or juice box on hand at all times  -Take 2 tablets, wait 15 minutes. If you still feel shaky take 2 more tablets.  -When you feel better, have a snack or meal with protein -Have a protein food with every meal and snack  -Meat, boiled eggs, peanut butter, nuts and seeds, milk, full-fat yogurt  -Avoid skipping meals  -Try to have something to eat every 3 hours -Healthy fats  -Nuts, seeds, nut butters, olive oil, salmon, canola oil, hummus -Foods to help with Macular Degeneration  -Sweet potato, pumpkin, orange bell pepper, carrots, cantaloupe, squash (have these foods with some type of fat)  -Dark green leafy veggies -Get a sublingual form of vitamin B12  Teaching Method Utilized:  Visual Auditory  Handouts given during visit include:  15g CHO + protein snacks  Barriers to learning/adherence to lifestyle change: extensive medical history   Demonstrated degree of understanding via:  Teach Back   Monitoring/Evaluation:  Dietary intake, exercise, and body weight in 6 week(s).

## 2013-08-24 DIAGNOSIS — M5030 Other cervical disc degeneration, unspecified cervical region: Secondary | ICD-10-CM | POA: Diagnosis not present

## 2013-08-24 DIAGNOSIS — M1990 Unspecified osteoarthritis, unspecified site: Secondary | ICD-10-CM | POA: Diagnosis not present

## 2013-08-24 DIAGNOSIS — IMO0001 Reserved for inherently not codable concepts without codable children: Secondary | ICD-10-CM | POA: Diagnosis not present

## 2013-08-26 ENCOUNTER — Ambulatory Visit (INDEPENDENT_AMBULATORY_CARE_PROVIDER_SITE_OTHER): Payer: Medicare Other | Admitting: Psychiatry

## 2013-08-26 DIAGNOSIS — F331 Major depressive disorder, recurrent, moderate: Secondary | ICD-10-CM

## 2013-08-30 DIAGNOSIS — G3184 Mild cognitive impairment, so stated: Secondary | ICD-10-CM | POA: Diagnosis not present

## 2013-08-30 DIAGNOSIS — G43009 Migraine without aura, not intractable, without status migrainosus: Secondary | ICD-10-CM | POA: Diagnosis not present

## 2013-08-30 DIAGNOSIS — G43109 Migraine with aura, not intractable, without status migrainosus: Secondary | ICD-10-CM | POA: Diagnosis not present

## 2013-08-30 DIAGNOSIS — R413 Other amnesia: Secondary | ICD-10-CM | POA: Diagnosis not present

## 2013-08-31 ENCOUNTER — Ambulatory Visit: Payer: Medicare Other | Admitting: Psychiatry

## 2013-09-06 DIAGNOSIS — H5210 Myopia, unspecified eye: Secondary | ICD-10-CM | POA: Diagnosis not present

## 2013-09-06 DIAGNOSIS — H2510 Age-related nuclear cataract, unspecified eye: Secondary | ICD-10-CM | POA: Diagnosis not present

## 2013-09-07 DIAGNOSIS — Z86010 Personal history of colonic polyps: Secondary | ICD-10-CM | POA: Diagnosis not present

## 2013-09-07 DIAGNOSIS — K2100 Gastro-esophageal reflux disease with esophagitis, without bleeding: Secondary | ICD-10-CM | POA: Diagnosis not present

## 2013-09-07 DIAGNOSIS — K2270 Barrett's esophagus without dysplasia: Secondary | ICD-10-CM | POA: Diagnosis not present

## 2013-09-07 DIAGNOSIS — R1310 Dysphagia, unspecified: Secondary | ICD-10-CM | POA: Diagnosis not present

## 2013-09-08 ENCOUNTER — Ambulatory Visit (INDEPENDENT_AMBULATORY_CARE_PROVIDER_SITE_OTHER): Payer: Medicare Other | Admitting: Psychiatry

## 2013-09-08 DIAGNOSIS — F331 Major depressive disorder, recurrent, moderate: Secondary | ICD-10-CM

## 2013-09-13 ENCOUNTER — Ambulatory Visit (HOSPITAL_COMMUNITY): Payer: Medicare Other

## 2013-09-13 DIAGNOSIS — K222 Esophageal obstruction: Secondary | ICD-10-CM | POA: Diagnosis not present

## 2013-09-13 DIAGNOSIS — K208 Other esophagitis without bleeding: Secondary | ICD-10-CM | POA: Diagnosis not present

## 2013-09-13 DIAGNOSIS — K449 Diaphragmatic hernia without obstruction or gangrene: Secondary | ICD-10-CM | POA: Diagnosis not present

## 2013-09-13 DIAGNOSIS — K2100 Gastro-esophageal reflux disease with esophagitis, without bleeding: Secondary | ICD-10-CM | POA: Diagnosis not present

## 2013-09-13 DIAGNOSIS — Z9884 Bariatric surgery status: Secondary | ICD-10-CM | POA: Diagnosis not present

## 2013-09-13 DIAGNOSIS — R1310 Dysphagia, unspecified: Secondary | ICD-10-CM | POA: Diagnosis not present

## 2013-09-13 DIAGNOSIS — R1013 Epigastric pain: Secondary | ICD-10-CM | POA: Diagnosis not present

## 2013-09-15 ENCOUNTER — Encounter: Payer: Self-pay | Admitting: Internal Medicine

## 2013-09-15 ENCOUNTER — Ambulatory Visit (INDEPENDENT_AMBULATORY_CARE_PROVIDER_SITE_OTHER): Payer: Medicare Other | Admitting: Internal Medicine

## 2013-09-15 VITALS — BP 113/65 | HR 89 | Temp 98.4°F | Resp 16 | Ht 65.5 in | Wt 108.0 lb

## 2013-09-15 DIAGNOSIS — F411 Generalized anxiety disorder: Secondary | ICD-10-CM

## 2013-09-15 DIAGNOSIS — J320 Chronic maxillary sinusitis: Secondary | ICD-10-CM | POA: Insufficient documentation

## 2013-09-15 DIAGNOSIS — F329 Major depressive disorder, single episode, unspecified: Secondary | ICD-10-CM

## 2013-09-15 DIAGNOSIS — F3289 Other specified depressive episodes: Secondary | ICD-10-CM

## 2013-09-15 HISTORY — DX: Chronic maxillary sinusitis: J32.0

## 2013-09-15 NOTE — Progress Notes (Signed)
   Subjective:    Patient ID: Kayla [name], female    DOB: 1945-10-27, 68 y.o.   MRN: [id number]  HPIhere for f/u Feels better in general  Neurology sent to psychiatry ---and now w/ Kinnie Scales at [facility] who will consid ADD(will test eventually do testing)/Mood disorders/?dementia--doing therapy and sent to chair yoga for deconditioning  Opthal-Patel=cataracts and blepharitis/corneal scratches/?macular degen(mom,gm,4 aunts also have this) so will start braille---Dr Leonarda Salon will do cataracts  Dr Lorine Bears mgmt--restricted driving til eyes fixed and gave cane to prevent falls//shower chair  Dr Zenia Resides- agreed w/ freq colonos q 44mos//endosc--did bx to r/o recur of barrett's//started linzess mammo next mo  Sports drinks plus milk and fruit helping energy and ? Weight(saw nutr- says eat 6 timesaday and add B vits--she can't manage the volume so will try ensure)  MRI Neuro [facility]=nonspecific///chronic sinusitis---see frequent treatments here--cont to have sxt with pain, pressure and green discharge///hx of this since childhood and she now feels this is causing some of her chronic HAs Started her on Vayacog for brain food  Dr Walden Field for many yrs--? Says HTN as well but we have never documented this here--never HTN here  Review of Systems  Constitutional: Negative for unexpected weight change.  HENT: Positive for postnasal drip, rhinorrhea and sinus pressure. Negative for hearing loss.   Eyes: Positive for visual disturbance.  Respiratory: Negative for shortness of breath.   Cardiovascular: Negative for chest pain, palpitations and leg swelling.  Gastrointestinal: Positive for constipation.   Extensive fam turmoil continues tho better with recent changes    Objective:   Physical Exam Wt Readings from Last 3 Encounters:  09/15/13 108 lb (48.988 kg)  08/23/13 105 lb 11.2 oz (47.945 kg)  07/13/13 107 lb (48.535 kg)   BP 113/65  Pulse 89  Temp(Src) 98.4 F (36.9 C)   Resp 16  Ht 5' 5.5" (1.664 m)  Wt 108 lb (48.988 kg)  BMI 17.69 kg/m2  SpO2 99% PERRLA/EOMs conjugate Tender maxillary to percussion No thyromegaly Heart regular Good peripheral pulses/no edema Mood is good/thought content normal/responds easily to questions       Assessment & Plan:  ANXIETY/DEPRESSION/PTSD etc  Beginning to respond to therapy  Chronic headaches--not much change  Degenerative spine disease/fibromyalgia-chronic pain management   Chronic maxillary sinusitis by CT and history - Plan: Ambulatory referral to ENT--? Would surgery possibly have an effect on chronic headaches  Weight loss-emaciation  Stable at this point and possibly improving  Hiatal hernia/esophagitis/chronic constipation/reflux--tolerable on medication  No medication changes at this point and all meds should be current for her mail order Followup 3 months

## 2013-09-16 ENCOUNTER — Ambulatory Visit (INDEPENDENT_AMBULATORY_CARE_PROVIDER_SITE_OTHER): Payer: Medicare Other | Admitting: Psychiatry

## 2013-09-16 DIAGNOSIS — F331 Major depressive disorder, recurrent, moderate: Secondary | ICD-10-CM

## 2013-09-21 ENCOUNTER — Ambulatory Visit: Payer: Medicare Other | Admitting: Psychiatry

## 2013-09-24 ENCOUNTER — Ambulatory Visit (HOSPITAL_COMMUNITY)
Admission: RE | Admit: 2013-09-24 | Discharge: 2013-09-24 | Disposition: A | Discharge status: Home / Self Care | Payer: Medicare Other | Source: Ambulatory Visit | Attending: Internal Medicine | Admitting: Internal Medicine

## 2013-09-24 DIAGNOSIS — Z1231 Encounter for screening mammogram for malignant neoplasm of breast: Secondary | ICD-10-CM | POA: Insufficient documentation

## 2013-09-27 ENCOUNTER — Telehealth: Payer: Self-pay | Admitting: *Deleted

## 2013-09-27 NOTE — Telephone Encounter (Signed)
Pt called back and asked me to mail her PPW to her. Verified address and put in mail outbox.

## 2013-09-27 NOTE — Telephone Encounter (Signed)
Left a message. Patient's forms are available for to pick up per Dr. [name]. Forms are left with the front office.

## 2013-09-28 ENCOUNTER — Other Ambulatory Visit: Payer: Self-pay | Admitting: Internal Medicine

## 2013-09-28 DIAGNOSIS — R928 Other abnormal and inconclusive findings on diagnostic imaging of breast: Secondary | ICD-10-CM

## 2013-09-30 ENCOUNTER — Other Ambulatory Visit: Payer: Self-pay | Admitting: Internal Medicine

## 2013-09-30 DIAGNOSIS — R928 Other abnormal and inconclusive findings on diagnostic imaging of breast: Secondary | ICD-10-CM

## 2013-10-01 ENCOUNTER — Other Ambulatory Visit: Payer: Self-pay

## 2013-10-01 ENCOUNTER — Other Ambulatory Visit: Payer: Self-pay | Admitting: Internal Medicine

## 2013-10-01 DIAGNOSIS — R928 Other abnormal and inconclusive findings on diagnostic imaging of breast: Secondary | ICD-10-CM

## 2013-10-02 ENCOUNTER — Telehealth: Payer: Self-pay

## 2013-10-02 NOTE — Telephone Encounter (Signed)
Kayla [name] received a call with her results from her mammogram. She says they found something in her left breast and wants her to have another mammogram and echogram on 10/06/13. Kayla [name] wants to ask [name] if she is strong enough to undergo surgery if necessary.

## 2013-10-04 ENCOUNTER — Ambulatory Visit: Payer: Medicare Other | Admitting: Dietician

## 2013-10-04 NOTE — Telephone Encounter (Signed)
Spoke with pt. Let her know that Dr. [name] didn't think she'd need surgery.

## 2013-10-04 NOTE — Telephone Encounter (Signed)
Tried to call pt. No VM no answer.

## 2013-10-04 NOTE — Telephone Encounter (Signed)
Yes tho I thi nk she won't need surgery

## 2013-10-06 ENCOUNTER — Ambulatory Visit
Admission: RE | Admit: 2013-10-06 | Discharge: 2013-10-06 | Disposition: A | Discharge status: Home / Self Care | Payer: Medicare Other | Source: Ambulatory Visit | Attending: Internal Medicine | Admitting: Internal Medicine

## 2013-10-06 DIAGNOSIS — N6459 Other signs and symptoms in breast: Secondary | ICD-10-CM | POA: Diagnosis not present

## 2013-10-06 DIAGNOSIS — R928 Other abnormal and inconclusive findings on diagnostic imaging of breast: Secondary | ICD-10-CM

## 2013-10-14 DIAGNOSIS — M150 Primary generalized (osteo)arthritis: Secondary | ICD-10-CM | POA: Diagnosis not present

## 2013-10-14 DIAGNOSIS — M797 Fibromyalgia: Secondary | ICD-10-CM | POA: Diagnosis not present

## 2013-10-14 DIAGNOSIS — M542 Cervicalgia: Secondary | ICD-10-CM | POA: Diagnosis not present

## 2013-10-19 ENCOUNTER — Ambulatory Visit: Payer: Medicare Other | Admitting: Psychiatry

## 2013-10-21 ENCOUNTER — Ambulatory Visit: Payer: Medicare Other | Admitting: Dietician

## 2013-10-21 ENCOUNTER — Other Ambulatory Visit: Payer: Self-pay | Admitting: Internal Medicine

## 2013-10-28 ENCOUNTER — Ambulatory Visit (INDEPENDENT_AMBULATORY_CARE_PROVIDER_SITE_OTHER): Payer: Medicare Other | Admitting: Psychiatry

## 2013-10-28 DIAGNOSIS — F332 Major depressive disorder, recurrent severe without psychotic features: Secondary | ICD-10-CM | POA: Diagnosis not present

## 2013-11-03 ENCOUNTER — Ambulatory Visit: Payer: Medicare Other | Admitting: Psychiatry

## 2013-11-05 DIAGNOSIS — H40013 Open angle with borderline findings, low risk, bilateral: Secondary | ICD-10-CM | POA: Diagnosis not present

## 2013-11-05 DIAGNOSIS — H04123 Dry eye syndrome of bilateral lacrimal glands: Secondary | ICD-10-CM | POA: Diagnosis not present

## 2013-11-05 DIAGNOSIS — D2311 Other benign neoplasm of skin of right eyelid, including canthus: Secondary | ICD-10-CM | POA: Diagnosis not present

## 2013-11-05 DIAGNOSIS — H2512 Age-related nuclear cataract, left eye: Secondary | ICD-10-CM | POA: Diagnosis not present

## 2013-11-05 DIAGNOSIS — H02052 Trichiasis without entropian right lower eyelid: Secondary | ICD-10-CM | POA: Diagnosis not present

## 2013-11-05 DIAGNOSIS — H35363 Drusen (degenerative) of macula, bilateral: Secondary | ICD-10-CM | POA: Diagnosis not present

## 2013-11-05 DIAGNOSIS — H2513 Age-related nuclear cataract, bilateral: Secondary | ICD-10-CM | POA: Diagnosis not present

## 2013-11-18 ENCOUNTER — Ambulatory Visit (INDEPENDENT_AMBULATORY_CARE_PROVIDER_SITE_OTHER): Payer: Medicare Other | Admitting: Psychiatry

## 2013-11-18 DIAGNOSIS — F332 Major depressive disorder, recurrent severe without psychotic features: Secondary | ICD-10-CM | POA: Diagnosis not present

## 2013-11-29 DIAGNOSIS — G43009 Migraine without aura, not intractable, without status migrainosus: Secondary | ICD-10-CM | POA: Diagnosis not present

## 2013-11-29 DIAGNOSIS — G3184 Mild cognitive impairment, so stated: Secondary | ICD-10-CM | POA: Diagnosis not present

## 2013-11-29 DIAGNOSIS — G43109 Migraine with aura, not intractable, without status migrainosus: Secondary | ICD-10-CM | POA: Diagnosis not present

## 2013-11-29 DIAGNOSIS — R413 Other amnesia: Secondary | ICD-10-CM | POA: Diagnosis not present

## 2013-11-30 ENCOUNTER — Ambulatory Visit (INDEPENDENT_AMBULATORY_CARE_PROVIDER_SITE_OTHER): Payer: Medicare Other | Admitting: Psychiatry

## 2013-11-30 ENCOUNTER — Ambulatory Visit: Payer: Medicare Other | Admitting: Psychiatry

## 2013-11-30 DIAGNOSIS — F332 Major depressive disorder, recurrent severe without psychotic features: Secondary | ICD-10-CM | POA: Diagnosis not present

## 2013-12-07 ENCOUNTER — Ambulatory Visit: Payer: Medicare Other | Admitting: Psychiatry

## 2013-12-15 ENCOUNTER — Ambulatory Visit (INDEPENDENT_AMBULATORY_CARE_PROVIDER_SITE_OTHER): Payer: Medicare Other | Admitting: Internal Medicine

## 2013-12-15 ENCOUNTER — Encounter: Payer: Self-pay | Admitting: Internal Medicine

## 2013-12-15 VITALS — BP 110/60 | HR 97 | Temp 98.5°F | Resp 16 | Ht 65.0 in | Wt 105.0 lb

## 2013-12-15 DIAGNOSIS — M47812 Spondylosis without myelopathy or radiculopathy, cervical region: Secondary | ICD-10-CM

## 2013-12-15 DIAGNOSIS — Z23 Encounter for immunization: Secondary | ICD-10-CM | POA: Diagnosis not present

## 2013-12-15 DIAGNOSIS — H0289 Other specified disorders of eyelid: Secondary | ICD-10-CM | POA: Diagnosis not present

## 2013-12-15 DIAGNOSIS — H35363 Drusen (degenerative) of macula, bilateral: Secondary | ICD-10-CM | POA: Diagnosis not present

## 2013-12-15 DIAGNOSIS — H2513 Age-related nuclear cataract, bilateral: Secondary | ICD-10-CM | POA: Diagnosis not present

## 2013-12-15 DIAGNOSIS — R490 Dysphonia: Secondary | ICD-10-CM | POA: Diagnosis not present

## 2013-12-15 DIAGNOSIS — M545 Low back pain, unspecified: Secondary | ICD-10-CM

## 2013-12-15 DIAGNOSIS — H40013 Open angle with borderline findings, low risk, bilateral: Secondary | ICD-10-CM | POA: Diagnosis not present

## 2013-12-15 MED ORDER — DICLOFENAC SODIUM 1 % TD GEL: 2.0000 g | Freq: Four times a day (QID) | TRANSDERMAL | Status: DC | PRN

## 2013-12-15 MED ORDER — IPRATROPIUM BROMIDE 0.06 % NA SOLN: 2.0000 | Freq: Two times a day (BID) | NASAL | Status: DC

## 2013-12-15 MED ORDER — ZOSTER VACCINE LIVE 19400 UNT/0.65ML ~~LOC~~ SOLR: 0.6500 mL | Freq: Once | SUBCUTANEOUS | Status: DC

## 2013-12-15 MED ORDER — GABAPENTIN 300 MG PO CAPS: 300.0000 mg | ORAL_CAPSULE | Freq: Three times a day (TID) | ORAL | Status: DC

## 2013-12-15 MED ORDER — DICLOFENAC SODIUM 1 % TD GEL
2.0000 g | Freq: Four times a day (QID) | TRANSDERMAL | Status: DC | PRN
Start: 2013-12-15 — End: 2013-12-16

## 2013-12-15 NOTE — Progress Notes (Signed)
Subjective:    Patient ID: Kayla [name], female    DOB: Aug 09, 1945, 68 y.o.   MRN: [id number]  HPI Prior to Admission medications   Medication Sig Start Date End Date Taking? Authorizing Provider  Ascorbic Acid (VITAMIN C) 1000 MG tablet Take 1,000 mg by mouth daily.   Yes Historical Provider, MD  beta carotene w/minerals (OCUVITE) tablet Take 1 tablet by mouth 2 (two) times daily.   Yes Historical Provider, MD  butalbital-acetaminophen-caffeine (FIORICET, ESGIC) 50-325-40 MG per tablet Take 1 tablet by mouth 3 (three) times daily. Headache 08/19/13  Yes Mancel Bale, PA-C  CALCIUM-MAGNESIUM-ZINC PO Take by mouth daily.   Yes Historical Provider, MD  Cholecalciferol (VITAMIN D3) 2000 UNITS TABS Take 2,000 Units by mouth daily.   Yes Historical Provider, MD  clonazePAM (KLONOPIN) 1 MG tablet Take 0.5-1 tablets (0.5-1 mg total) by mouth 2 (two) times daily as needed. anxiety/panic attacks 06/29/13  Yes Leandrew Koyanagi, MD  cycloSPORINE (RESTASIS) 0.05 % ophthalmic emulsion 1 drop 2 (two) times daily.   Yes Historical Provider, MD  diclofenac sodium (VOLTAREN) 1 % GEL Apply 2 g topically 4 (four) times daily as needed. For pain. 08/20/13  Yes Heather M Marte, PA-C  dicyclomine (BENTYL) 10 MG capsule Take 1-2 tab 3 times daily as needed for spasms and cramping. 08/19/13  Yes Mancel Bale, PA-C  docusate sodium (COLACE) 250 MG capsule Take 250 mg by mouth daily.   Yes Historical Provider, MD  DULoxetine (CYMBALTA) 30 MG capsule Take 1 capsule (30 mg total) by mouth daily. 08/20/13  Yes Heather M Marte, PA-C  esomeprazole (NEXIUM) 40 MG capsule Take 1 capsule (40 mg total) by mouth daily before breakfast. 08/19/13  Yes Mancel Bale, PA-C  fentaNYL (DURAGESIC - DOSED MCG/HR) 75 MCG/HR Place 1 patch onto the skin every 3 (three) days.   Yes Historical Provider, MD  Flaxseed, Linseed, (FLAX SEED OIL) 1000 MG CAPS Take 1,000 mg by mouth daily.   Yes Historical Provider, MD  gabapentin (NEURONTIN) 300 MG  capsule Take 1 capsule (300 mg total) by mouth 3 (three) times daily. 08/19/13  Yes Mancel Bale, PA-C  HYDROcodone-acetaminophen (NORCO) 7.5-325 MG per tablet Take 1 tablet by mouth every 6 (six) hours as needed. Pain    Yes Historical Provider, MD  lidocaine (LIDODERM) 5 % Place 1 patch onto the skin as needed. Remove & Discard patch within 12 hours. May dispense as 3 month supply 02/08/13  Yes Leandrew Koyanagi, MD  Linaclotide [facility]) 145 MCG CAPS capsule Take 145 mcg by mouth daily.   Yes Historical Provider, MD  Omega-3 Fatty Acids (SEA-OMEGA PO) Take 1,000 mg by mouth daily.   Yes Historical Provider, MD  phenazopyridine (PYRIDIUM) 200 MG tablet Take 1 tablet (200 mg total) by mouth 3 (three) times daily as needed for pain. 05/20/13  Yes Roselee Culver, MD  Phosphatidylserine-DHA-EPA (VAYACOG PO) Take 310 mg by mouth.   Yes Historical Provider, MD  Polyethyl Glycol-Propyl Glycol (SYSTANE) 0.4-0.3 % GEL Apply to eye.   Yes Historical Provider, MD  polyethylene glycol powder (GLYCOLAX/MIRALAX) powder TAKE 17 GRAMS BY MOUTH DAILY IN JUICE 08/16/13  Yes Mancel Bale, PA-C  pyridoxine (B-6) 200 MG tablet Take 200 mg by mouth daily.   Yes Historical Provider, MD  sucralfate (CARAFATE) 1 GM/10ML suspension Take 1 g by mouth 4 (four) times daily -  with meals and at bedtime.   Yes Historical Provider, MD  traZODone (DESYREL) 150  MG tablet Take 1 tablet (150 mg total) by mouth at bedtime. And 1/2 -1 if wakes at 4am. 08/19/13  Yes Mancel Bale, PA-C  zoster vaccine live, PF, (ZOSTAVAX) 29562 UNT/0.65ML injection Inject 19,400 Units into the skin once. 12/15/13  Yes Leandrew Koyanagi, MD  fluticasone (FLONASE) 50 MCG/ACT nasal spray 2 sprays each nostril hs. May do 3 months at a time 08/19/13   Mancel Bale, PA-C   Pain cspine-rad lshoulder and now into L carotid--volta gel only help Dr Vilinda Flake wants to inject [facility]-neuro consult--HAs maybe med related but saw nothing else to do except what we are  doing Sinus issues frequent Waking with palpitations reflux/PND/Larynx----see extensive GI evaluation  She is afraid she is injured her larynx with vigorous cleaning of the debris that catches in her tonsils from postnasal draining  She is noted hoarseness increased recently Review of Systems No fever chills or night sweats No further weight loss Appetite is much improved after starting liquid supplements No chest pain No syncope She has noticed skin changes again with a slight scaly rash around her nose and mouth    Objective:   Physical Exam BP 110/60 mmHg  Pulse 97  Temp(Src) 98.5 F (36.9 C)  Resp 16  Ht 5\' 5"  (1.651 m)  Wt 105 lb (47.628 kg)  BMI 17.47 kg/m2  SpO2 98% Wt Readings from Last 3 Encounters:  12/15/13 105 lb (47.628 kg)  09/15/13 108 lb (48.988 kg)  08/23/13 105 lb 11.2 oz (47.945 kg)   in good spirits HEENT clear include no thyromegaly or nodules Heart regular without murmur Lungs clear Good peripheral pulses No edema Straight leg raise negative to 90 bilaterally  Mildly tender around paraspinal muscles in the thoracic and lumbar area  Tender paracervical muscles as well  Neck range of motion fairly good  Cranial nerves II through XII intact Skin-changes of perioral dermatitis mild       Assessment & Plan:  Perioral dermatitis  Topica;l treatment first Hoarseness Frequent sinus problems  ENT consult next  Midline low back pain without sciatica - Plan: gabapentin (NEURONTIN) 300 MG capsule, exercises  Osteoarthritis cervical spine - Plan: Range of motion/massage/full tearing gel  Flu vaccine Meds ordered this encounter  Medications  . zoster vaccine live, PF, (ZOSTAVAX) 13086 UNT/0.65ML injection    Sig: Inject 19,400 Units into the skin once.    Dispense:  1 each    Refill:  0  . gabapentin (NEURONTIN) 300 MG capsule    Sig: Take 1 capsule (300 mg total) by mouth 3 (three) times daily.    Dispense:  270 capsule    Refill:  0  .  ipratropium (ATROVENT) 0.06 % nasal spray    Sig: Place 2 sprays into both nostrils 2 (two) times daily.    Dispense:  15 mL    Refill:  12  Diclofenac written for gel at local pharmacy and for mail-order Follow-up 3 months

## 2013-12-16 ENCOUNTER — Ambulatory Visit (INDEPENDENT_AMBULATORY_CARE_PROVIDER_SITE_OTHER): Payer: Medicare Other | Admitting: Psychiatry

## 2013-12-16 ENCOUNTER — Telehealth: Payer: Self-pay | Admitting: Radiology

## 2013-12-16 DIAGNOSIS — M545 Low back pain, unspecified: Secondary | ICD-10-CM

## 2013-12-16 DIAGNOSIS — F332 Major depressive disorder, recurrent severe without psychotic features: Secondary | ICD-10-CM

## 2013-12-16 MED ORDER — DICLOFENAC SODIUM 1 % TD GEL: 2.0000 g | Freq: Four times a day (QID) | TRANSDERMAL | Status: DC | PRN

## 2013-12-16 NOTE — Telephone Encounter (Signed)
Patient called me she wants the Voltaren Gel sent to [facility]/ done

## 2013-12-20 ENCOUNTER — Telehealth: Payer: Self-pay

## 2013-12-20 DIAGNOSIS — F411 Generalized anxiety disorder: Secondary | ICD-10-CM

## 2013-12-20 DIAGNOSIS — G44009 Cluster headache syndrome, unspecified, not intractable: Secondary | ICD-10-CM

## 2013-12-20 NOTE — Telephone Encounter (Signed)
Patient states someone did in fact call her today and she was disconnected. She says it was someone clinical. Patient is needing a rx [facility]ill [facility] They are no longer electronic and patient has to call every month requesting refills to be mailed to her house through [place]. She would like: klonozapam, and headache medication, flonase and amatrex.   Lurena Joiner fax: [phone] Best: [phone]

## 2013-12-21 ENCOUNTER — Ambulatory Visit: Payer: Medicare Other | Admitting: Psychiatry

## 2013-12-21 MED ORDER — BUTALBITAL-APAP-CAFFEINE 50-325-40 MG PO TABS: 1.0000 | ORAL_TABLET | Freq: Three times a day (TID) | ORAL | Status: DC

## 2013-12-21 MED ORDER — CLONAZEPAM 1 MG PO TABS: 0.5000 mg | ORAL_TABLET | Freq: Two times a day (BID) | ORAL | Status: DC | PRN

## 2013-12-21 MED ORDER — FLUTICASONE PROPIONATE 50 MCG/ACT NA SUSP: NASAL | Status: DC

## 2013-12-22 MED ORDER — FLUTICASONE PROPIONATE 50 MCG/ACT NA SUSP: NASAL | Status: DC

## 2013-12-22 NOTE — Addendum Note (Signed)
Addended by: Elwyn Reach A on: 12/22/2013 09:59 AM   Modules accepted: Orders

## 2013-12-22 NOTE — Telephone Encounter (Signed)
Faxed in clonazepam, fioicet and flonase. Neither Dr [name] or I could find Imitrex on pt's med list. [facility] for pt to [facility] for details.

## 2013-12-22 NOTE — Addendum Note (Signed)
Addended by: Elwyn Reach A on: 12/22/2013 10:42 AM   Modules accepted: Orders

## 2013-12-22 NOTE — Telephone Encounter (Addendum)
Pt CB and reported that Dr [name] Rxd Imitrex years ago (before EPIC) to use only for breakthrough migraines that Fioricet doesn't work for. She stated that she rarely uses it bc it makes her sick in her stomach, but would like to have it on hand for emergencies. Rx was for 100mg , take 1 tablet Q 3 days prn migraines (this is to go to [facility]) I didn't know what # you would want to Rx. Pended. She also asked if a Rx for clonazepam could be faxed to [facility] on [address] for her to use this month bc it takes [facility] a month to get her Rxs to her. Pended this also.  Pt reported that her husband has been Dxd w/PNA and she has gotten sick now as well. She reported that she has had a lot of diarrhea, congestion and now has moved to her chest. Reported she feels very sick and weak and that she has lost down to 98 lbs this morning. She asked for an Abx to be sent in bc she said she is too sick to come in. I advised her that she needs eval to check for PNA also, and that she is probably dehydrated and, if so, getting fluids would make her feel better quickly. Pt agreed to come in later if she can get here.

## 2013-12-24 ENCOUNTER — Telehealth: Payer: Self-pay

## 2013-12-24 NOTE — Telephone Encounter (Signed)
Pt calling back to speak with Elwyn Reach. Cb # [phone]

## 2013-12-25 ENCOUNTER — Ambulatory Visit (INDEPENDENT_AMBULATORY_CARE_PROVIDER_SITE_OTHER): Payer: Medicare Other

## 2013-12-25 ENCOUNTER — Ambulatory Visit (INDEPENDENT_AMBULATORY_CARE_PROVIDER_SITE_OTHER): Payer: Medicare Other | Admitting: Family Medicine

## 2013-12-25 VITALS — BP 100/70 | HR 88 | Temp 98.5°F | Resp 16 | Ht 65.5 in | Wt 100.5 lb

## 2013-12-25 DIAGNOSIS — R059 Cough, unspecified: Secondary | ICD-10-CM

## 2013-12-25 DIAGNOSIS — J0110 Acute frontal sinusitis, unspecified: Secondary | ICD-10-CM

## 2013-12-25 DIAGNOSIS — R05 Cough: Secondary | ICD-10-CM | POA: Diagnosis not present

## 2013-12-25 DIAGNOSIS — F411 Generalized anxiety disorder: Secondary | ICD-10-CM

## 2013-12-25 MED ORDER — CLONAZEPAM 1 MG PO TABS: 0.5000 mg | ORAL_TABLET | Freq: Two times a day (BID) | ORAL | Status: DC | PRN

## 2013-12-25 MED ORDER — AMOXICILLIN 875 MG PO TABS: 875.0000 mg | ORAL_TABLET | Freq: Two times a day (BID) | ORAL | Status: DC

## 2013-12-25 NOTE — Progress Notes (Signed)
[facility] [address], [place] [address] [phone]  Date:  12/25/2013   Name:  Kayla [name]   DOB:  October 01, 1945   MRN:  [id number]  PCP:  Leandrew Koyanagi, MD    Chief Complaint: Anorexia; Cough; Chest Pain; and Sinus Problem   History of Present Illness:  Kayla [name] is a 68 y.o. very pleasant female patient who presents with the following:  Here today with illness.  She has been sick for about 3 weeks.  She was in to see Dr. [name] on 12/2.  However at that time her sx were non specific.    Her husband has been ill as well but he is getting better with abx.   "I know that my sinueses are infected, they stay infected. I was supposed to be seen at [facility] but I canceled the appointment because I was so sick."  States that she is having "dumping syndrome" and having diarrhea, and that she has lost 5 lbs She states that she "may have damaged the back of my throat by taking a long tweezer to pull out clumps of mucus and I scratched my throat.'   She would like the "rule out pneumoia,and hopefully get antibiotics for whatever infection."   Also, she is out of her klonpin which she uses for panic attacks. She had her mail away pharm rx done, but needs a local rx as well while she waits on the mail order supply She has not measured a fever at home- she has checked her temp and been ok  BP Readings from Last 3 Encounters:  12/25/13 100/70  12/15/13 110/60  09/15/13 113/65    Wt Readings from Last 3 Encounters:  12/25/13 100 lb 8 oz (45.587 kg)  12/15/13 105 lb (47.628 kg)  09/15/13 108 lb (48.988 kg)     Patient Active Problem List   Diagnosis Date Noted  . Chronic maxillary sinusitis 09/15/2013  . Personal history of colonic polyps 07/22/2013  . HA (headache)-chronic/tension type 07/28/2012  . Osteoporosis, unspecified 07/28/2012  . Unspecified vitamin D deficiency-2009 07/28/2012  . Nephrolithiasis 07/28/2012  .  Fibromyalgia 07/26/2012  . Chronic interstitial cystitis 10/16/2011  . Chest pain 12/21/2010  . Chronic constipation 10/04/2010  . WEIGHT LOSS 11/27/2009  . PERSONAL HISTORY OF FAILED MODERATE SEDATION 06/01/2008  . ANXIETY 03/18/2007  . DEPRESSION 03/18/2007  . GERD 03/18/2007  . HIATAL HERNIA 03/18/2007  . OSTEOARTHRITIS 03/18/2007  . [place] DISEASE, CERVICAL 03/18/2007  . [place] DISEASE, LUMBAR 03/18/2007  . FIBROMYALGIA 03/18/2007    Past Medical History  Diagnosis Date  . MVP (mitral valve prolapse)   . Barrett esophagus     not consistently present  . History of kidney stones   . Anxiety   . IBS (irritable bowel syndrome)   . Complication of anesthesia     either  BP or pulse dropped with last surgery  . Angina     takes Propanolol prn and it stops her chest  . Chronic kidney disease     kidney stones and UTI  . Depression     post traumatic stress  disorder  . Hypertension     3 small brain aneurysms  . GERD (gastroesophageal reflux disease)   . DDD (degenerative disc disease)     cervical and lumbar  . Osteoarthritis     all over  . Headache(784.0)     migraines  . Dysrhythmia     brought on  by stress  . Peripheral vascular disease     superficial phlebitis in left calf  . Diabetes mellitus     sugar goes up and down diet controlled  . Anemia     anemia in the past  . Fibromyalgia     neuropathy knees ankles and toes, bladder  . Hiatal hernia   . H/O hyperparathyroidism   . Osteopetrosis   . Allergy   . Osteoporosis   . Dementia   . Macular degeneration   . Fatigue   . Headache   . Migraines   . Tachycardia   . Personal history of colonic polyps 07/22/2013    07/2013 - 3 small adenomas - repeat colonoscopy 2018    Past Surgical History  Procedure Laterality Date  . US echocardiography  01/21/2007    EF 55-60%  . US echocardiography  08/30/2003    EF 55-60%  . Cardiovascular stress test  10/03/2006  . Esophagus surgery    . Gastric bypass  1999   . Appendectomy    . Cholecystectomy    . Abdominal hysterectomy    . Gastric restriction surgery  1991  . Tubal ligation    . Right knee arthroscopy    . Right arm surgery    . Upper gastrointestinal endoscopy  06/23/2008    w/Dil, Barrett's esophagus  . Colonoscopy  06/23/2008    normal  . Tonsillectomy    . Cardiac catheterization  03/22/1991    EF 61%  . Stomach stappeling  1991  . Dilation and curettage of uterus      x2  . Exploratory laparotomy  1993  . Stone extraction with basket  2012  . Rt wrist fx  2009    History  Substance Use Topics  . Smoking status: Never Smoker   . Smokeless tobacco: Never Used  . Alcohol Use: No    Family History  Problem Relation Age of Onset  . Stroke Mother   . Lung cancer Mother   . Heart disease Mother   . Diabetes Mother   . Hypertension Father   . Stroke Father   . Lung cancer Father   . Heart disease Father   . Kidney disease Father   . Seizures Father     epilepsy, and sisters x 2  . Pancreatic cancer Sister   . Lung cancer Sister   . Colon cancer Maternal Aunt     12 relatives  . Uterine cancer      aunt  . Heart disease      grandmother  . Clotting disorder Sister   . Heart disease Sister     x 2  . Kidney disease Sister     x 2  . Breast cancer Sister   . Colon cancer Paternal Aunt   . Colon cancer Paternal Aunt     Allergies  Allergen Reactions  . Codeine Anaphylaxis  . Oxycodone-Acetaminophen Shortness Of Breath  . Tylox [Oxycodone-Acetaminophen] Anaphylaxis    Chest pain  . Darvocet [Propoxyphene N-Acetaminophen]     Chest pain  . Darvon [Propoxyphene] Itching  . Food Swelling    Peanuts-wheat-  . Lorcet [Hydrocodone-Acetaminophen]     Chest pain  . Opium     hallucinations  . Wheat Bran   . Zolpidem Tartrate Other (See Comments)    Makes her go crazy  . Zolpidem Tartrate     hallucinations    Medication list has been reviewed and updated.  Current Outpatient Prescriptions on File  Prior  to Visit  Medication Sig Dispense Refill  . Ascorbic Acid (VITAMIN C) 1000 MG tablet Take 1,000 mg by mouth daily.    . beta carotene w/minerals (OCUVITE) tablet Take 1 tablet by mouth 2 (two) times daily.    . butalbital-acetaminophen-caffeine (FIORICET, ESGIC) 50-325-40 MG per tablet Take 1 tablet by mouth 3 (three) times daily. Headache 270 tablet 3  . CALCIUM-MAGNESIUM-ZINC PO Take by mouth daily.    . Cholecalciferol (VITAMIN D3) 2000 UNITS TABS Take 2,000 Units by mouth daily.    . clonazePAM (KLONOPIN) 1 MG tablet Take 0.5-1 tablets (0.5-1 mg total) by mouth 2 (two) times daily as needed. anxiety/panic attacks 180 tablet 1  . cycloSPORINE (RESTASIS) 0.05 % ophthalmic emulsion 1 drop 2 (two) times daily.    . diclofenac sodium (VOLTAREN) 1 % GEL Apply 2 g topically 4 (four) times daily as needed. For pain. 100 g 3  . dicyclomine (BENTYL) 10 MG capsule Take 1-2 tab 3 times daily as needed for spasms and cramping. 270 capsule 3  . docusate sodium (COLACE) 250 MG capsule Take 250 mg by mouth daily.    . DULoxetine (CYMBALTA) 30 MG capsule Take 1 capsule (30 mg total) by mouth daily. 90 capsule 3  . esomeprazole (NEXIUM) 40 MG capsule Take 1 capsule (40 mg total) by mouth daily before breakfast. 90 capsule 3  . fentaNYL (DURAGESIC - DOSED MCG/HR) 75 MCG/HR Place 1 patch onto the skin every 3 (three) days.    . Flaxseed, Linseed, (FLAX SEED OIL) 1000 MG CAPS Take 1,000 mg by mouth daily.    . fluticasone (FLONASE) 50 MCG/ACT nasal spray 2 sprays each nostril hs. May do 3 months at a time 16 g 11  . gabapentin (NEURONTIN) 300 MG capsule Take 1 capsule (300 mg total) by mouth 3 (three) times daily. 270 capsule 0  . HYDROcodone-acetaminophen (NORCO) 7.5-325 MG per tablet Take 1 tablet by mouth every 6 (six) hours as needed. Pain     . ipratropium (ATROVENT) 0.06 % nasal spray Place 2 sprays into both nostrils 2 (two) times daily. 15 mL 12  . lidocaine (LIDODERM) 5 % Place 1 patch onto the skin as  needed. Remove & Discard patch within 12 hours. May dispense as 3 month supply 30 patch 11  . Linaclotide (LINZESS) 145 MCG CAPS capsule Take 145 mcg by mouth daily.    . Omega-3 Fatty Acids (SEA-OMEGA PO) Take 1,000 mg by mouth daily.    . Phosphatidylserine-DHA-EPA (VAYACOG PO) Take 310 mg by mouth.    . polyethylene glycol powder (GLYCOLAX/MIRALAX) powder TAKE 17 GRAMS BY MOUTH DAILY IN JUICE 527 g 2  . pyridoxine (B-6) 200 MG tablet Take 200 mg by mouth daily.    . sucralfate (CARAFATE) 1 GM/10ML suspension Take 1 g by mouth 4 (four) times daily -  with meals and at bedtime.    . traZODone (DESYREL) 150 MG tablet Take 1 tablet (150 mg total) by mouth at bedtime. And 1/2 -1 if wakes at 4am. 180 tablet 3  . zoster vaccine live, PF, (ZOSTAVAX) 09811 UNT/0.65ML injection Inject 19,400 Units into the skin once. 1 each 0  . [DISCONTINUED] buPROPion (WELLBUTRIN) 75 MG tablet Take 75 mg by mouth daily after breakfast.     . [DISCONTINUED] solifenacin (VESICARE) 5 MG tablet Take 1 tablet (5 mg total) by mouth daily. 90 tablet 3   No current facility-administered medications on file prior to visit.    Review of Systems:  As  per HPI- otherwise negative.   Physical Examination: Filed Vitals:   12/25/13 0915  BP: 100/70  Pulse: 88  Temp: 98.5 F (36.9 C)  Resp: 16   Filed Vitals:   12/25/13 0915  Height: 5' 5.5" (1.664 m)  Weight: 100 lb 8 oz (45.587 kg)   Body mass index is 16.46 kg/(m^2). Ideal Body Weight: Weight in (lb) to have BMI = 25: 152.2  GEN: WDWN, NAD, Non-toxic, A & O x 3, very thin, anxious HEENT: Atraumatic, Normocephalic. Neck supple. No masses, No LAD.  Bilateral TM wnl, oropharynx normal.  PEERL,EOMI.  No visible injury to her throat Ears and Nose: No external deformity. CV: RRR, No M/G/R. No JVD. No thrill. No extra heart sounds. PULM: CTA B, no wheezes, crackles, rhonchi. No retractions. No resp. distress. No accessory muscle use. ABD: S, NT, ND, +BS. No rebound.  No HSM. EXTR: No c/c/e NEURO Normal gait.  PSYCH: Normally interactive. Conversant. Not depressed or anxious appearing.  Calm demeanor.   [facility] reading (PRIMARY) by  Dr. Lorelei Pont. CXR:  Negative CHEST 2 VIEW  COMPARISON: None.  FINDINGS: Cardiac and mediastinal contours are within normal limits. There is no focal airspace consolidation, pulmonary edema, pleural effusion or pneumothorax. No suspicious pulmonary mass or nodule. Mild hyperinflation. Surgical clips noted in the region of the gastroesophageal junction. Osseous structures are intact and unremarkable.  IMPRESSION: 1. Mild pulmonary hyperinflation. Otherwise, negative chest x-ray.   Assessment and Plan: Acute frontal sinusitis, recurrence not specified - Plan: amoxicillin (AMOXIL) 875 MG tablet  Anxiety state - Plan: clonazePAM (KLONOPIN) 1 MG tablet  Cough - Plan: DG Chest 2 View  Amoxicillin for what she thinks is a recurrent sinus infection.  She will follow-up with ENT Called in an rx for her klonopin to use as needed for anxiety  See patient instructions for more details.    Signed Lamar Blinks, MD

## 2013-12-25 NOTE — Patient Instructions (Signed)
I do not see any sign of pneumonia.  Use the amoxicillin as directed for your sinuses. Be sure to follow-up with ENT when you can Let me know if you do not feel better soon and please work on gaining back the weight that you have lost.  Let's recheck your weight in 6-8 weeks  I also refilled your klonopin to use as needed for anxiety

## 2013-12-25 NOTE — Telephone Encounter (Signed)
Called in Rx for clonazepam as directed by Dr. Lorelei Pont.

## 2013-12-27 DIAGNOSIS — M5382 Other specified dorsopathies, cervical region: Secondary | ICD-10-CM | POA: Diagnosis not present

## 2013-12-27 DIAGNOSIS — M797 Fibromyalgia: Secondary | ICD-10-CM | POA: Diagnosis not present

## 2013-12-28 ENCOUNTER — Ambulatory Visit (INDEPENDENT_AMBULATORY_CARE_PROVIDER_SITE_OTHER): Payer: Medicare Other | Admitting: Psychiatry

## 2013-12-28 DIAGNOSIS — F332 Major depressive disorder, recurrent severe without psychotic features: Secondary | ICD-10-CM | POA: Diagnosis not present

## 2014-01-04 ENCOUNTER — Ambulatory Visit (INDEPENDENT_AMBULATORY_CARE_PROVIDER_SITE_OTHER): Payer: Medicare Other | Admitting: Psychiatry

## 2014-01-04 DIAGNOSIS — F332 Major depressive disorder, recurrent severe without psychotic features: Secondary | ICD-10-CM | POA: Diagnosis not present

## 2014-01-11 ENCOUNTER — Ambulatory Visit (INDEPENDENT_AMBULATORY_CARE_PROVIDER_SITE_OTHER): Payer: Medicare Other | Admitting: Psychiatry

## 2014-01-11 DIAGNOSIS — F332 Major depressive disorder, recurrent severe without psychotic features: Secondary | ICD-10-CM

## 2014-01-17 DIAGNOSIS — G43909 Migraine, unspecified, not intractable, without status migrainosus: Secondary | ICD-10-CM | POA: Diagnosis not present

## 2014-01-17 DIAGNOSIS — J329 Chronic sinusitis, unspecified: Secondary | ICD-10-CM | POA: Diagnosis not present

## 2014-01-18 ENCOUNTER — Ambulatory Visit (INDEPENDENT_AMBULATORY_CARE_PROVIDER_SITE_OTHER): Payer: Medicare Other | Admitting: Psychiatry

## 2014-01-18 DIAGNOSIS — F332 Major depressive disorder, recurrent severe without psychotic features: Secondary | ICD-10-CM | POA: Diagnosis not present

## 2014-01-19 ENCOUNTER — Telehealth: Payer: Self-pay

## 2014-01-19 DIAGNOSIS — F411 Generalized anxiety disorder: Secondary | ICD-10-CM

## 2014-01-19 NOTE — Telephone Encounter (Signed)
This she mean neurosurgical evaluation to do something about her neck orders she mean further neurological evaluation to decide what the diagnosis actually is---[facility] does not do neurosurgery

## 2014-01-19 NOTE — Telephone Encounter (Signed)
Patient was referred to [facility] in [place] for her severe neck pain. Patient has recently spoken to her phycologist and they had recommended [facility]. Patient is requesting Dr [name] to refer her to [facility] for a 2nd opinion and then she will decide which place to have the procedure done at. Patients call back number is [phone]

## 2014-01-22 ENCOUNTER — Telehealth: Payer: Self-pay

## 2014-01-22 NOTE — Telephone Encounter (Signed)
Pt was calling back after she missed her call. Her aunt is in ICU at [facility], that's why she missed her call. Please advise at [phone]

## 2014-01-22 NOTE — Telephone Encounter (Signed)
Spoke with pt. She is just wanting to know if you could write her a rx for Xanax in a higher dose. Her neuro only gave her 0.5mg  which she doesn't think is going to be enough to get her through the procedures that she has to have done.

## 2014-01-22 NOTE — Telephone Encounter (Signed)
Left message on machine to call back  

## 2014-01-22 NOTE — Telephone Encounter (Signed)
See other phone message  

## 2014-01-24 MED ORDER — ALPRAZOLAM 1 MG PO TABS: ORAL_TABLET | ORAL | Status: DC

## 2014-01-24 NOTE — Telephone Encounter (Signed)
Fine--let me know date of procedures and I can arrange premedication

## 2014-01-24 NOTE — Telephone Encounter (Signed)
Spoke to pt she is going to have her procedures on 1/27 2pm she has to take a pill 30 minutes before the procedure.

## 2014-01-24 NOTE — Telephone Encounter (Signed)
Faxed and notified pt

## 2014-01-24 NOTE — Telephone Encounter (Signed)
Realized Rx wasn't signed yet, so called it in instead.

## 2014-01-25 ENCOUNTER — Ambulatory Visit: Payer: Medicare Other | Admitting: Psychiatry

## 2014-02-01 ENCOUNTER — Ambulatory Visit (INDEPENDENT_AMBULATORY_CARE_PROVIDER_SITE_OTHER): Payer: Medicare Other | Admitting: Psychiatry

## 2014-02-01 DIAGNOSIS — F332 Major depressive disorder, recurrent severe without psychotic features: Secondary | ICD-10-CM | POA: Diagnosis not present

## 2014-02-08 ENCOUNTER — Ambulatory Visit (INDEPENDENT_AMBULATORY_CARE_PROVIDER_SITE_OTHER): Payer: Medicare Other | Admitting: Psychiatry

## 2014-02-08 DIAGNOSIS — F332 Major depressive disorder, recurrent severe without psychotic features: Secondary | ICD-10-CM | POA: Diagnosis not present

## 2014-02-09 DIAGNOSIS — M5382 Other specified dorsopathies, cervical region: Secondary | ICD-10-CM | POA: Diagnosis not present

## 2014-02-11 ENCOUNTER — Ambulatory Visit (INDEPENDENT_AMBULATORY_CARE_PROVIDER_SITE_OTHER): Payer: Medicare Other | Admitting: Physician Assistant

## 2014-02-11 VITALS — BP 100/60 | HR 83 | Temp 98.7°F | Resp 18 | Ht 65.0 in | Wt 106.0 lb

## 2014-02-11 DIAGNOSIS — N3000 Acute cystitis without hematuria: Secondary | ICD-10-CM

## 2014-02-11 DIAGNOSIS — N898 Other specified noninflammatory disorders of vagina: Secondary | ICD-10-CM | POA: Diagnosis not present

## 2014-02-11 DIAGNOSIS — Z8719 Personal history of other diseases of the digestive system: Secondary | ICD-10-CM | POA: Diagnosis not present

## 2014-02-11 DIAGNOSIS — K602 Anal fissure, unspecified: Secondary | ICD-10-CM | POA: Diagnosis not present

## 2014-02-11 DIAGNOSIS — Z9884 Bariatric surgery status: Secondary | ICD-10-CM | POA: Diagnosis not present

## 2014-02-11 DIAGNOSIS — N23 Unspecified renal colic: Secondary | ICD-10-CM | POA: Diagnosis not present

## 2014-02-11 DIAGNOSIS — R300 Dysuria: Secondary | ICD-10-CM | POA: Diagnosis not present

## 2014-02-11 HISTORY — DX: Bariatric surgery status: Z98.84

## 2014-02-11 LAB — POCT URINALYSIS DIPSTICK
BILIRUBIN UA: NEGATIVE
Glucose, UA: NEGATIVE
NITRITE UA: NEGATIVE
PH UA: 5
SPEC GRAV UA: 1.025
Urobilinogen, UA: 0.2

## 2014-02-11 LAB — POCT WET PREP WITH KOH
Clue Cells Wet Prep HPF POC: NEGATIVE
KOH PREP POC: NEGATIVE
Trichomonas, UA: NEGATIVE
Yeast Wet Prep HPF POC: NEGATIVE

## 2014-02-11 LAB — POCT UA - MICROSCOPIC ONLY
CASTS, UR, LPF, POC: NEGATIVE
CRYSTALS, UR, HPF, POC: NEGATIVE
Mucus, UA: NEGATIVE
Yeast, UA: NEGATIVE

## 2014-02-11 MED ORDER — LIDOCAINE (ANORECTAL) 5 % EX GEL: 1.0000 "application " | Freq: Three times a day (TID) | CUTANEOUS | Status: DC

## 2014-02-11 MED ORDER — DILTIAZEM GEL 2 %: 1.0000 "application " | Freq: Two times a day (BID) | CUTANEOUS | Status: DC

## 2014-02-11 MED ORDER — SULFAMETHOXAZOLE-TRIMETHOPRIM 800-160 MG PO TABS: 1.0000 | ORAL_TABLET | Freq: Two times a day (BID) | ORAL | Status: DC

## 2014-02-11 NOTE — Progress Notes (Signed)
Subjective:    Patient ID: Kayla [name], female    DOB: 1945/02/26, 69 y.o.   MRN: [id number]  HPI Pt presents to clinic with vaginal irritation for the last several months to years.  She is worried about cancer even though she has hyst years ago.  She is having some hot flashes at night but is unsure why because she was told her ovaries were removed with the hyst.  She has estrace cream but does not use it because she is afraid of getting cancer.  She feels like she has a tear in her vagina or anus (she has to push near her perineum to have a BM due to enlarged and decrease muscle tone in her rectum.  She wonders if she cut herself by accident.  She has not had sex since 1999.  She feels like there is a vaginal odor which bothers her but she has stopped douching because she was told it was bad for her.  She is having dysuria and urinary urgency and frequency.  Her urine has an odor. She is having some bilateral back pain and abd pressure.  Review of Systems  Constitutional: Negative for fever and chills.  Gastrointestinal: Negative for nausea and vomiting.  Genitourinary: Positive for dysuria, urgency, frequency, vaginal discharge (thick white in her vagina but not on her underwear) and vaginal pain (dryness). Negative for hematuria and pelvic pain.  Musculoskeletal: Positive for back pain (R>L).   Patient Active Problem List   Diagnosis Date Noted  . History of Barrett's esophagus 02/11/2014  . H/O gastric bypass 02/11/2014  . Chronic maxillary sinusitis 09/15/2013  . Personal history of colonic polyps 07/22/2013  . HA (headache)-chronic/tension type 07/28/2012  . Osteoporosis, unspecified 07/28/2012  . Unspecified vitamin D deficiency-2009 07/28/2012  . Nephrolithiasis 07/28/2012  . Fibromyalgia 07/26/2012  . Chronic interstitial cystitis 10/16/2011  . Chest pain 12/21/2010  . Chronic constipation 10/04/2010  . WEIGHT LOSS 11/27/2009  . PERSONAL HISTORY OF FAILED MODERATE  SEDATION 06/01/2008  . ANXIETY 03/18/2007  . DEPRESSION 03/18/2007  . GERD 03/18/2007  . HIATAL HERNIA 03/18/2007  . OSTEOARTHRITIS 03/18/2007  . [place] DISEASE, CERVICAL 03/18/2007  . [place] DISEASE, LUMBAR 03/18/2007  . FIBROMYALGIA 03/18/2007   Prior to Admission medications   Medication Sig Start Date End Date Taking? Authorizing Provider  ALPRAZolam Duanne Moron) 1 MG tablet Take 1 tablet before procedure 01/24/14  Yes Leandrew Koyanagi, MD  Ascorbic Acid (VITAMIN C) 1000 MG tablet Take 1,000 mg by mouth daily.   Yes Historical Provider, MD  beta carotene w/minerals (OCUVITE) tablet Take 1 tablet by mouth 2 (two) times daily.   Yes Historical Provider, MD  butalbital-acetaminophen-caffeine (FIORICET, ESGIC) 50-325-40 MG per tablet Take 1 tablet by mouth 3 (three) times daily. Headache 12/21/13  Yes Leandrew Koyanagi, MD  CALCIUM-MAGNESIUM-ZINC PO Take by mouth daily.   Yes Historical Provider, MD  Cholecalciferol (VITAMIN D3) 2000 UNITS TABS Take 2,000 Units by mouth daily.   Yes Historical Provider, MD  clonazePAM (KLONOPIN) 1 MG tablet Take 0.5-1 tablets (0.5-1 mg total) by mouth 2 (two) times daily as needed. anxiety/panic attacks 12/25/13  Yes Gay Filler Copland, MD  cycloSPORINE (RESTASIS) 0.05 % ophthalmic emulsion 1 drop 2 (two) times daily.   Yes Historical Provider, MD  diclofenac sodium (VOLTAREN) 1 % GEL Apply 2 g topically 4 (four) times daily as needed. For pain. 12/16/13  Yes Leandrew Koyanagi, MD  dicyclomine (BENTYL) 10 MG capsule Take 1-2 tab 3 times daily  as needed for spasms and cramping. 08/19/13  Yes Mancel Bale, PA-C  docusate sodium (COLACE) 250 MG capsule Take 250 mg by mouth daily.   Yes Historical Provider, MD  DULoxetine (CYMBALTA) 30 MG capsule Take 1 capsule (30 mg total) by mouth daily. 08/20/13  Yes Heather M Marte, PA-C  esomeprazole (NEXIUM) 40 MG capsule Take 1 capsule (40 mg total) by mouth daily before breakfast. 08/19/13  Yes Mancel Bale, PA-C  fentaNYL  (DURAGESIC - DOSED MCG/HR) 75 MCG/HR Place 1 patch onto the skin every 3 (three) days.   Yes Historical Provider, MD  Flaxseed, Linseed, (FLAX SEED OIL) 1000 MG CAPS Take 1,000 mg by mouth daily.   Yes Historical Provider, MD  fluticasone (FLONASE) 50 MCG/ACT nasal spray 2 sprays each nostril hs. May do 3 months at a time 12/22/13  Yes Leandrew Koyanagi, MD  gabapentin (NEURONTIN) 300 MG capsule Take 1 capsule (300 mg total) by mouth 3 (three) times daily. 12/15/13  Yes Leandrew Koyanagi, MD  HYDROcodone-acetaminophen ([place]) 7.5-325 MG per tablet Take 1 tablet by mouth every 6 (six) hours as needed. Pain    Yes Historical Provider, MD  ipratropium (ATROVENT) 0.06 % nasal spray Place 2 sprays into both nostrils 2 (two) times daily. 12/15/13  Yes Leandrew Koyanagi, MD  lidocaine (LIDODERM) 5 % Place 1 patch onto the skin as needed. Remove & Discard patch within 12 hours. May dispense as 3 month supply 02/08/13  Yes Leandrew Koyanagi, MD  Linaclotide [facility]) 145 MCG CAPS capsule Take 145 mcg by mouth daily.   Yes Historical Provider, MD  Omega-3 Fatty Acids (SEA-OMEGA PO) Take 1,000 mg by mouth daily.   Yes Historical Provider, MD  Phosphatidylserine-DHA-EPA (VAYACOG PO) Take 310 mg by mouth.   Yes Historical Provider, MD  polyethylene glycol powder (GLYCOLAX/MIRALAX) powder TAKE 17 GRAMS BY MOUTH DAILY IN JUICE 08/16/13  Yes Mancel Bale, PA-C  pyridoxine (B-6) 200 MG tablet Take 200 mg by mouth daily.   Yes Historical Provider, MD  sucralfate (CARAFATE) 1 GM/10ML suspension Take 1 g by mouth 4 (four) times daily -  with meals and at bedtime.   Yes Historical Provider, MD  traZODone (DESYREL) 150 MG tablet Take 1 tablet (150 mg total) by mouth at bedtime. And 1/2 -1 if wakes at 4am. 08/19/13  Yes Mancel Bale, PA-C  zoster vaccine live, PF, (ZOSTAVAX) 57846 UNT/0.65ML injection Inject 19,400 Units into the skin once. 12/15/13  Yes Leandrew Koyanagi, MD   Allergies  Allergen Reactions  . Codeine  Anaphylaxis  . Oxycodone-Acetaminophen Shortness Of Breath  . Tylox [Oxycodone-Acetaminophen] Anaphylaxis    Chest pain  . Darvocet [Propoxyphene N-Acetaminophen]     Chest pain  . Darvon [Propoxyphene] Itching  . Food Swelling    Peanuts-wheat-  . Lorcet [Hydrocodone-Acetaminophen]     Chest pain  . Opium     hallucinations  . Wheat Bran   . Zolpidem Tartrate Other (See Comments)    Makes her go crazy  . Zolpidem Tartrate     hallucinations    Medications, allergies, past medical history, surgical history, family history, social history and problem list reviewed and updated.     Objective:   Physical Exam  Constitutional: She is oriented to person, place, and time. She appears well-developed and well-nourished.  BP 100/60 mmHg  Pulse 83  Temp(Src) 98.7 F (37.1 C) (Oral)  Resp 18  Ht 5\' 5"  (1.651 m)  Wt 106 lb (48.081 kg)  BMI 17.64 kg/m2  SpO2 97%   HENT:  Head: Normocephalic and atraumatic.  Right Ear: External ear normal.  Left Ear: External ear normal.  Eyes: Conjunctivae are normal.  Cardiovascular: Normal rate, regular rhythm and normal heart sounds.   No murmur heard. Pulmonary/Chest: Effort normal and breath sounds normal. She has no wheezes.  Abdominal: Soft.  Genitourinary: Rectal exam shows fissure. Pelvic exam was performed with patient supine. No erythema, tenderness or bleeding in the vagina. No foreign body around the vagina. There are signs of injury in the vagina. No vaginal discharge found.  Atrophic vaginal mucosa - very pale  Erythematous macular area with central ulcerated appears on the right inner aspect of her labia minor Small fissure at 12 o'clock of anus. No cervix present.   Neurological: She is alert and oriented to person, place, and time.  Skin: Skin is warm and dry.  Psychiatric: She has a normal mood and affect. Her behavior is normal. Judgment and thought content normal.   Results for orders placed or performed in visit on  02/11/14  POCT urinalysis dipstick  Result Value Ref Range   Color, UA yellow    Clarity, UA clear    Glucose, UA neg    Bilirubin, UA neg    Ketones, UA trace    Spec Grav, UA 1.025    Blood, UA trace-lysed    pH, UA 5.0    Protein, UA trace    Urobilinogen, UA 0.2    Nitrite, UA neg    Leukocytes, UA small (1+)   POCT UA - Microscopic Only  Result Value Ref Range   WBC, Ur, HPF, POC 6-8    RBC, urine, microscopic 3-5    Bacteria, U Microscopic trace    Mucus, UA neg    Epithelial cells, urine per micros 0-1    Crystals, Ur, HPF, POC neg    Casts, Ur, LPF, POC neg    Yeast, UA neg    Renal tubular cells 1-3   POCT Wet Prep with KOH  Result Value Ref Range   Trichomonas, UA Negative    Clue Cells Wet Prep HPF POC neg    Epithelial Wet Prep HPF POC 0-3    Yeast Wet Prep HPF POC neg    Bacteria Wet Prep HPF POC 1+    RBC Wet Prep HPF POC 0-2    WBC Wet Prep HPF POC 4-6    KOH Prep POC Negative        Assessment & Plan:  Burning with urination - Plan: POCT urinalysis dipstick, POCT UA - Microscopic Only, Urine culture  Vaginal atrophy - d/w pt ways to lubricate and decrease the dryness without use of estrogen cream  Vaginal irritation - Plan: POCT Wet Prep with KOH  Acute cystitis without hematuria - increase water intake as best as possible after gastric bypass - Plan: sulfamethoxazole-trimethoprim (BACTRIM DS,SEPTRA DS) 800-160 MG per tablet  Anal fissure - Plan: diltiazem 2 % GEL, Lidocaine, Anorectal, 5 % GEL  Patient Instructions  Some type of lubricant in your vagina like KY jelly that can sometimes be thin but it will help with the friction feeling that you are feeling -- you can also use aquaphor on the outside which is thicker or a diaper rash ointment will also do the same thing  Windell Hummingbird PA-C  [facility] 02/11/2014 5:04 PM

## 2014-02-11 NOTE — Patient Instructions (Signed)
Some type of lubricant in your vagina like KY jelly that can sometimes be thin but it will help with the friction feeling that you are feeling -- you can also use aquaphor on the outside which is thicker or a diaper rash ointment will also do the same thing

## 2014-02-14 LAB — URINE CULTURE

## 2014-02-15 ENCOUNTER — Ambulatory Visit (INDEPENDENT_AMBULATORY_CARE_PROVIDER_SITE_OTHER): Payer: Medicare Other | Admitting: Psychiatry

## 2014-02-15 DIAGNOSIS — F332 Major depressive disorder, recurrent severe without psychotic features: Secondary | ICD-10-CM | POA: Diagnosis not present

## 2014-02-16 DIAGNOSIS — M5382 Other specified dorsopathies, cervical region: Secondary | ICD-10-CM | POA: Diagnosis not present

## 2014-02-16 DIAGNOSIS — M797 Fibromyalgia: Secondary | ICD-10-CM | POA: Diagnosis not present

## 2014-02-17 DIAGNOSIS — H25812 Combined forms of age-related cataract, left eye: Secondary | ICD-10-CM | POA: Diagnosis not present

## 2014-02-17 DIAGNOSIS — H2512 Age-related nuclear cataract, left eye: Secondary | ICD-10-CM | POA: Diagnosis not present

## 2014-02-19 ENCOUNTER — Telehealth: Payer: Self-pay

## 2014-02-19 NOTE — Telephone Encounter (Signed)
Patient says she is on the last pill and still experiencing urinary incontinence and vaginal issues. She wants to know if anything else can be rx

## 2014-02-21 NOTE — Telephone Encounter (Signed)
Kayla [name] did you want to have pt RTC?

## 2014-02-22 ENCOUNTER — Ambulatory Visit (INDEPENDENT_AMBULATORY_CARE_PROVIDER_SITE_OTHER): Payer: Medicare Other | Admitting: Psychiatry

## 2014-02-22 DIAGNOSIS — F332 Major depressive disorder, recurrent severe without psychotic features: Secondary | ICD-10-CM | POA: Diagnosis not present

## 2014-02-22 NOTE — Telephone Encounter (Signed)
I think a referral might be best either to a gynourologist.  If she has a gyn getting an appt with them would be a good start - if she does not have one I am more than happy to refer her but I am not sure what else I can do for the patient.

## 2014-02-23 DIAGNOSIS — M5382 Other specified dorsopathies, cervical region: Secondary | ICD-10-CM | POA: Diagnosis not present

## 2014-02-23 DIAGNOSIS — M542 Cervicalgia: Secondary | ICD-10-CM | POA: Diagnosis not present

## 2014-02-23 NOTE — Telephone Encounter (Signed)
Left detailed message on machine explaining message from Judson Roch. Left message for pt to call back.

## 2014-03-01 ENCOUNTER — Ambulatory Visit (INDEPENDENT_AMBULATORY_CARE_PROVIDER_SITE_OTHER): Payer: Medicare Other | Admitting: Psychiatry

## 2014-03-01 DIAGNOSIS — F332 Major depressive disorder, recurrent severe without psychotic features: Secondary | ICD-10-CM | POA: Diagnosis not present

## 2014-03-02 DIAGNOSIS — H2511 Age-related nuclear cataract, right eye: Secondary | ICD-10-CM | POA: Diagnosis not present

## 2014-03-08 ENCOUNTER — Ambulatory Visit (INDEPENDENT_AMBULATORY_CARE_PROVIDER_SITE_OTHER): Payer: Medicare Other | Admitting: Psychiatry

## 2014-03-08 DIAGNOSIS — F332 Major depressive disorder, recurrent severe without psychotic features: Secondary | ICD-10-CM | POA: Diagnosis not present

## 2014-03-09 ENCOUNTER — Ambulatory Visit (INDEPENDENT_AMBULATORY_CARE_PROVIDER_SITE_OTHER): Payer: Medicare Other | Admitting: Psychology

## 2014-03-09 DIAGNOSIS — F4323 Adjustment disorder with mixed anxiety and depressed mood: Secondary | ICD-10-CM | POA: Diagnosis not present

## 2014-03-15 ENCOUNTER — Ambulatory Visit: Payer: Medicare Other | Admitting: Psychiatry

## 2014-03-16 ENCOUNTER — Encounter: Payer: Self-pay | Admitting: Internal Medicine

## 2014-03-16 ENCOUNTER — Ambulatory Visit (INDEPENDENT_AMBULATORY_CARE_PROVIDER_SITE_OTHER): Payer: Medicare Other | Admitting: Internal Medicine

## 2014-03-16 DIAGNOSIS — R490 Dysphonia: Secondary | ICD-10-CM

## 2014-03-16 DIAGNOSIS — R1310 Dysphagia, unspecified: Secondary | ICD-10-CM

## 2014-03-16 DIAGNOSIS — N189 Chronic kidney disease, unspecified: Secondary | ICD-10-CM | POA: Insufficient documentation

## 2014-03-16 DIAGNOSIS — M545 Low back pain, unspecified: Secondary | ICD-10-CM

## 2014-03-16 MED ORDER — DICLOFENAC SODIUM 1 % TD GEL: 2.0000 g | Freq: Four times a day (QID) | TRANSDERMAL | Status: DC

## 2014-03-16 MED ORDER — DICLOFENAC SODIUM 1 % TD GEL: 2.0000 g | Freq: Four times a day (QID) | TRANSDERMAL | Status: DC | PRN

## 2014-03-16 MED ORDER — GABAPENTIN 300 MG PO CAPS: 300.0000 mg | ORAL_CAPSULE | Freq: Three times a day (TID) | ORAL | Status: DC

## 2014-03-16 NOTE — Progress Notes (Signed)
Subjective:    Patient ID: Kayla [name], female    DOB: May 23, 1945, 69 y.o.   MRN: [id number]  HPI here for follow-up----- Cataract R 21 and L next week--better but not 20/20 Nerve blocks--ablation cspine next--Dr reiter Dr Lorine Bears mgmt took away license w dx pseudodementia neuropsych June 2015 [facility] says early dementia not pseudo--- dr Lurline Hare to retest--dr lisc is in the balance Neck pain is driving pain treatment--if ablation works then may get off fentanyl lidoderm and voltaren help Lots of stress resolved--is in a very good phase in her relationship with her husband. Issues surrounding his children all resolved. His prior substance use is resolved  Still with intermittent hoarseness and trouble swallowing--husband notes voice change worsening See eval Dr Anthonette Legato [place]   Patient Active Problem List   Diagnosis Date Noted  . History of Barrett's esophagus-- 02/11/2014  . H/O gastric bypass 02/11/2014  . Personal history of colonic polyps-----she is very worried about the possibility of cancer and is compared to different gastroenterology interpretations of her current conditions. She prefers to follow-up with Dr. Liane Comber in [place] who wants to do yearly colonoscopies at this point  07/22/2013  . HA (headache)-chronic/tension type 07/28/2012  . Osteoporosis, unspecified 07/28/2012  . Nephrolithiasis-hx 07/28/2012  . Fibromyalgia---energy much impr w/ protein drinks BUT can't gain weight 07/26/2012  . Chronic interstitial cystitis 10/16/2011  . Chest pain 12/21/2010  . Chronic constipation/polyps precancer plus fam hx-Dr Liane Comber wants yearly F/U while Dr Darnell Level wants 5 yr f/u so she'll decide 10/04/2010  . WEIGHT LOSS----stabilized  11/27/2009  . ANXIETY 03/18/2007  . DEPRESSION 03/18/2007  . GERD 03/18/2007  . HIATAL HERNIA 03/18/2007  . OSTEOARTHRITIS 03/18/2007  . [place] DISEASE, CERVICAL 03/18/2007  . [place] DISEASE, LUMBAR 03/18/2007  ---   ?memory issues  Prior to Admission medications   Medication Sig Start Date End Date Taking? Authorizing Provider  ALPRAZolam Duanne Moron) 1 MG tablet Take 1 tablet before procedure 01/24/14  Yes Leandrew Koyanagi, MD  Ascorbic Acid (VITAMIN C) 1000 MG tablet Take 1,000 mg by mouth daily.   Yes Historical Provider, MD  beta carotene w/minerals (OCUVITE) tablet Take 1 tablet by mouth 2 (two) times daily.   Yes Historical Provider, MD  butalbital-acetaminophen-caffeine (FIORICET, ESGIC) 50-325-40 MG per tablet Take 1 tablet by mouth 3 (three) times daily. Headache 12/21/13  Yes Leandrew Koyanagi, MD  CALCIUM-MAGNESIUM-ZINC PO Take by mouth daily.   Yes Historical Provider, MD  Cholecalciferol (VITAMIN D3) 2000 UNITS TABS Take 2,000 Units by mouth daily.   Yes Historical Provider, MD  clonazePAM (KLONOPIN) 1 MG tablet Take 0.5-1 tablets (0.5-1 mg total) by mouth 2 (two) times daily as needed. anxiety/panic attacks 12/25/13  Yes Gay Filler Copland, MD  cycloSPORINE (RESTASIS) 0.05 % ophthalmic emulsion 1 drop 2 (two) times daily.   Yes Historical Provider, MD  diclofenac sodium (VOLTAREN) 1 % GEL Apply 2 g topically 4 (four) times daily as needed. For pain. 12/16/13  Yes Leandrew Koyanagi, MD  dicyclomine (BENTYL) 10 MG capsule Take 1-2 tab 3 times daily as needed for spasms and cramping. 08/19/13  Yes Mancel Bale, PA-C  diltiazem 2 % GEL Apply 1 application topically 2 (two) times daily. 02/11/14  Yes Mancel Bale, PA-C  docusate sodium (COLACE) 250 MG capsule Take 250 mg by mouth daily.   Yes Historical Provider, MD  DULoxetine (CYMBALTA) 30 MG capsule Take 1 capsule (30 mg total) by mouth daily. 08/20/13  Yes Heather  M Marte, PA-C  esomeprazole (NEXIUM) 40 MG capsule Take 1 capsule (40 mg total) by mouth daily before breakfast. 08/19/13  Yes Mancel Bale, PA-C  fentaNYL (DURAGESIC - DOSED MCG/HR) 75 MCG/HR Place 1 patch onto the skin every 3 (three) days.   Yes Historical Provider, MD  Flaxseed, Linseed,  (FLAX SEED OIL) 1000 MG CAPS Take 1,000 mg by mouth daily.   Yes Historical Provider, MD  fluticasone (FLONASE) 50 MCG/ACT nasal spray 2 sprays each nostril hs. May do 3 months at a time 12/22/13  Yes Leandrew Koyanagi, MD  gabapentin (NEURONTIN) 300 MG capsule Take 1 capsule (300 mg total) by mouth 3 (three) times daily. 12/15/13  Yes Leandrew Koyanagi, MD  HYDROcodone-acetaminophen ([place]) 7.5-325 MG per tablet Take 1 tablet by mouth every 6 (six) hours as needed. Pain    Yes Historical Provider, MD  ipratropium (ATROVENT) 0.06 % nasal spray Place 2 sprays into both nostrils 2 (two) times daily. 12/15/13  Yes Leandrew Koyanagi, MD  lidocaine (LIDODERM) 5 % Place 1 patch onto the skin as needed. Remove & Discard patch within 12 hours. May dispense as 3 month supply 02/08/13  Yes Leandrew Koyanagi, MD  Lidocaine, Anorectal, 5 % GEL Apply 1 application topically 3 (three) times daily. 02/11/14  Yes Sarah Alleen Borne, PA-C  Linaclotide (LINZESS) 145 MCG CAPS capsule Take 145 mcg by mouth daily.   Yes Historical Provider, MD  Omega-3 Fatty Acids (SEA-OMEGA PO) Take 1,000 mg by mouth daily.   Yes Historical Provider, MD  Phosphatidylserine-DHA-EPA (VAYACOG PO) Take 310 mg by mouth.   Yes Historical Provider, MD  polyethylene glycol powder (GLYCOLAX/MIRALAX) powder TAKE 17 GRAMS BY MOUTH DAILY IN JUICE 08/16/13  Yes Mancel Bale, PA-C  pyridoxine (B-6) 200 MG tablet Take 200 mg by mouth daily.   Yes Historical Provider, MD  sucralfate (CARAFATE) 1 GM/10ML suspension Take 1 g by mouth 4 (four) times daily -  with meals and at bedtime.   Yes Historical Provider, MD  traZODone (DESYREL) 150 MG tablet Take 1 tablet (150 mg total) by mouth at bedtime. And 1/2 -1 if wakes at 4am. 08/19/13  Yes Mancel Bale, PA-C  zoster vaccine live, PF, (ZOSTAVAX) 09811 UNT/0.65ML injection Inject 19,400 Units into the skin once. 12/15/13  Yes Leandrew Koyanagi, MD     Review of Systems No fever chills or night sweats No recent  weight loss No active reflux recently No postnasal drip No shortness of breath or wheezing No chest pain or palpitations No peripheral edema Recent GU problems resolved with treatment    Objective:   Physical Exam BP 92/56 mmHg  Pulse 80  Temp(Src) 98.3 F (36.8 C) (Oral)  Resp 16  Ht 5' 5.6" (1.666 m)  Wt 101 lb 3.2 oz (45.904 kg)  BMI 16.54 kg/m2  SpO2 97% HEENT clear No thyromegaly or lymphadenopathy Heart regular No peripheral edema Mood good and affect appropriate Thought content normal       Assessment & Plan:  Hoarseness of voice - Plan: Ambulatory referral to ENT  Dysphagia - Plan: Ambulatory referral to ENT  Midline low back pain without sciatica - Plan: diclofenac sodium (VOLTAREN) 1 % GEL, gabapentin (NEURONTIN) 300 MG capsule  Continue other follow-ups with specialist/return here 3 months  Meds ordered this encounter  Medications  . diclofenac sodium (VOLTAREN) 1 % GEL    Sig: Apply 2 g topically 4 (four) times daily.    Dispense:  100 g  Refill:  3  . diclofenac sodium (VOLTAREN) 1 % GEL    Sig: Apply 2 g topically 4 (four) times daily as needed. For pain.    Dispense:  100 g    Refill:  3  . gabapentin (NEURONTIN) 300 MG capsule    Sig: Take 1 capsule (300 mg total) by mouth 3 (three) times daily.    Dispense:  270 capsule    Refill:  0

## 2014-03-17 DIAGNOSIS — H25811 Combined forms of age-related cataract, right eye: Secondary | ICD-10-CM | POA: Diagnosis not present

## 2014-03-17 DIAGNOSIS — T8131XA Disruption of external operation (surgical) wound, not elsewhere classified, initial encounter: Secondary | ICD-10-CM | POA: Diagnosis not present

## 2014-03-17 DIAGNOSIS — H2511 Age-related nuclear cataract, right eye: Secondary | ICD-10-CM | POA: Diagnosis not present

## 2014-03-22 ENCOUNTER — Ambulatory Visit: Payer: Medicare Other | Admitting: Psychology

## 2014-03-22 ENCOUNTER — Ambulatory Visit (INDEPENDENT_AMBULATORY_CARE_PROVIDER_SITE_OTHER): Payer: Medicare Other | Admitting: Psychiatry

## 2014-03-22 DIAGNOSIS — F332 Major depressive disorder, recurrent severe without psychotic features: Secondary | ICD-10-CM | POA: Diagnosis not present

## 2014-03-23 DIAGNOSIS — H59021 Cataract (lens) fragments in eye following cataract surgery, right eye: Secondary | ICD-10-CM | POA: Diagnosis not present

## 2014-03-25 ENCOUNTER — Telehealth: Payer: Self-pay

## 2014-03-25 DIAGNOSIS — R490 Dysphonia: Secondary | ICD-10-CM | POA: Diagnosis not present

## 2014-03-25 DIAGNOSIS — R1313 Dysphagia, pharyngeal phase: Secondary | ICD-10-CM | POA: Diagnosis not present

## 2014-03-25 NOTE — Telephone Encounter (Signed)
Pt wants Dr. Sonia Baller to know-her L cataract surgery went well. Her R cataract surgery went bad, so bad she is being sent to a retinal surgeon. She saw her nasal specialist and there is no polyps. She is being sent to a speech therapist because of her problems with swallowing and her raspy voice. She would also like her AVS from 03/16/14 mailed to her. Please advise at  [phone]

## 2014-03-28 DIAGNOSIS — M47817 Spondylosis without myelopathy or radiculopathy, lumbosacral region: Secondary | ICD-10-CM | POA: Diagnosis not present

## 2014-03-28 NOTE — Telephone Encounter (Signed)
AVS mailed Dr. [name], [facility]

## 2014-03-29 ENCOUNTER — Ambulatory Visit (INDEPENDENT_AMBULATORY_CARE_PROVIDER_SITE_OTHER): Payer: Medicare Other | Admitting: Psychiatry

## 2014-03-29 ENCOUNTER — Other Ambulatory Visit (HOSPITAL_COMMUNITY): Payer: Self-pay | Admitting: Otolaryngology

## 2014-03-29 DIAGNOSIS — F332 Major depressive disorder, recurrent severe without psychotic features: Secondary | ICD-10-CM | POA: Diagnosis not present

## 2014-03-29 DIAGNOSIS — R1310 Dysphagia, unspecified: Secondary | ICD-10-CM

## 2014-03-30 DIAGNOSIS — H43813 Vitreous degeneration, bilateral: Secondary | ICD-10-CM | POA: Diagnosis not present

## 2014-03-30 DIAGNOSIS — H43391 Other vitreous opacities, right eye: Secondary | ICD-10-CM | POA: Diagnosis not present

## 2014-03-31 ENCOUNTER — Ambulatory Visit (HOSPITAL_COMMUNITY): Admission: RE | Admit: 2014-03-31 | Payer: Self-pay | Source: Ambulatory Visit

## 2014-03-31 ENCOUNTER — Ambulatory Visit (HOSPITAL_COMMUNITY): Admission: RE | Admit: 2014-03-31 | Payer: Medicare Other | Source: Ambulatory Visit

## 2014-03-31 DIAGNOSIS — H59021 Cataract (lens) fragments in eye following cataract surgery, right eye: Secondary | ICD-10-CM | POA: Diagnosis not present

## 2014-04-05 ENCOUNTER — Ambulatory Visit (INDEPENDENT_AMBULATORY_CARE_PROVIDER_SITE_OTHER): Payer: Medicare Other | Admitting: Psychiatry

## 2014-04-05 DIAGNOSIS — F332 Major depressive disorder, recurrent severe without psychotic features: Secondary | ICD-10-CM

## 2014-04-06 ENCOUNTER — Ambulatory Visit (INDEPENDENT_AMBULATORY_CARE_PROVIDER_SITE_OTHER): Payer: Medicare Other | Admitting: Psychology

## 2014-04-06 DIAGNOSIS — F438 Other reactions to severe stress: Secondary | ICD-10-CM

## 2014-04-12 ENCOUNTER — Ambulatory Visit (INDEPENDENT_AMBULATORY_CARE_PROVIDER_SITE_OTHER): Payer: Medicare Other | Admitting: Psychiatry

## 2014-04-12 DIAGNOSIS — F332 Major depressive disorder, recurrent severe without psychotic features: Secondary | ICD-10-CM

## 2014-04-18 DIAGNOSIS — M797 Fibromyalgia: Secondary | ICD-10-CM | POA: Diagnosis not present

## 2014-04-18 DIAGNOSIS — M542 Cervicalgia: Secondary | ICD-10-CM | POA: Diagnosis not present

## 2014-04-19 ENCOUNTER — Ambulatory Visit (INDEPENDENT_AMBULATORY_CARE_PROVIDER_SITE_OTHER): Payer: Medicare Other | Admitting: Psychiatry

## 2014-04-19 DIAGNOSIS — F332 Major depressive disorder, recurrent severe without psychotic features: Secondary | ICD-10-CM

## 2014-04-20 ENCOUNTER — Encounter: Payer: Self-pay | Admitting: Internal Medicine

## 2014-04-20 ENCOUNTER — Ambulatory Visit (INDEPENDENT_AMBULATORY_CARE_PROVIDER_SITE_OTHER): Payer: Medicare Other | Admitting: Internal Medicine

## 2014-04-20 VITALS — BP 101/64 | HR 85 | Temp 98.2°F | Resp 16 | Ht 65.0 in | Wt 101.0 lb

## 2014-04-20 DIAGNOSIS — G894 Chronic pain syndrome: Secondary | ICD-10-CM | POA: Diagnosis not present

## 2014-04-20 DIAGNOSIS — M5137 Other intervertebral disc degeneration, lumbosacral region: Secondary | ICD-10-CM | POA: Diagnosis not present

## 2014-04-20 DIAGNOSIS — R3919 Other difficulties with micturition: Secondary | ICD-10-CM

## 2014-04-20 DIAGNOSIS — R39198 Other difficulties with micturition: Secondary | ICD-10-CM

## 2014-04-20 DIAGNOSIS — N3941 Urge incontinence: Secondary | ICD-10-CM

## 2014-04-20 DIAGNOSIS — M5030 Other cervical disc degeneration, unspecified cervical region: Secondary | ICD-10-CM

## 2014-04-20 DIAGNOSIS — R5382 Chronic fatigue, unspecified: Secondary | ICD-10-CM

## 2014-04-20 LAB — POCT URINALYSIS DIPSTICK
Bilirubin, UA: NEGATIVE
Blood, UA: NEGATIVE
Glucose, UA: NEGATIVE
LEUKOCYTES UA: NEGATIVE
NITRITE UA: NEGATIVE
PH UA: 5.5
PROTEIN UA: NEGATIVE
Spec Grav, UA: 1.025
Urobilinogen, UA: 0.2

## 2014-04-20 LAB — POCT UA - MICROSCOPIC ONLY
Casts, Ur, LPF, POC: NEGATIVE
Crystals, Ur, HPF, POC: NEGATIVE
Mucus, UA: POSITIVE
Yeast, UA: NEGATIVE

## 2014-04-20 NOTE — Progress Notes (Signed)
Subjective:    Patient ID: Kayla [name], female    DOB: 22-Apr-1945, 69 y.o.   MRN: [id number]  HPIsuprapubic pressure, flank tenderness, nocturia x2-3, incontinence occasionally 2-3 months Hx rec utis//has seen urol  Neuro cog eval dr altabet recent=Great!!!!-no cog decline--should be able to resume driving  Dr Randye Lobo GI--lots of polyps --wants to repeat at 3 yrs Dr Carlean Purl has seeen as well  L cataract perfect after surgery but then R catac surg dr Leonarda Salon at [facility] 3-4 w ago--got upset with "her moving"--implant didn't fit and then ? Disrupted globe--  Now has seen [facility]--sent to ret surg ctr to see dr Tye Savoy for immed surgery=vitrectomy the next  day >2weeks ago--now slow improvement-to f/u next week  Chr pain lum/cerv--unhappy with fentanyl and Dr Brett Fairy won't change--frustrated---wants new eval  Review of Systems HAs every am still//also a little hungover from [place] but much more active!!    Dr Redmond Baseman sched [facility]--hoarseness and hearing investig No other positives today  Objective:   Physical Exam  Constitutional: She is oriented to person, place, and time. She appears well-developed and well-nourished. No distress.  Much better than last yr  HENT:  Head: Normocephalic and atraumatic.  Eyes: Pupils are equal, round, and reactive to light.  Neck: Normal range of motion.  Cardiovascular: Normal rate and regular rhythm.   Pulmonary/Chest: Effort normal. No respiratory distress.  Abdominal: There is no tenderness.  No cva tend  Musculoskeletal: Normal range of motion. She exhibits no edema.  Neurological: She is alert and oriented to person, place, and time. No cranial nerve deficit.  Skin: Skin is warm and dry.  Psychiatric: She has a normal mood and affect. Her behavior is normal. Thought content normal.  Nursing note and vitals reviewed.  BP 101/64 mmHg  Pulse 85  Temp(Src) 98.2 F (36.8 C)  Resp 16  Ht _0  (1.651 m)  Wt 101 lb (45.813 kg)   BMI 16.81 kg/m2  SpO2 97%  Addend 4/8 Results for orders placed or performed in visit on 04/20/14  Urine culture  Result Value Ref Range   Preliminary Report Culture reincubated for better growth   CBC with Differential/Platelet  Result Value Ref Range   WBC 6.4 4.0 - 10.5 K/uL   RBC 4.31 3.87 - 5.11 MIL/uL   Hemoglobin 12.6 12.0 - 15.0 g/dL   HCT 38.0 36.0 - 46.0 %   MCV 88.2 78.0 - 100.0 fL   MCH 29.2 26.0 - 34.0 pg   MCHC 33.2 30.0 - 36.0 g/dL   RDW 14.5 11.5 - 15.5 %   Platelets 222 150 - 400 K/uL   MPV 10.0 8.6 - 12.4 fL   Neutrophils Relative % 45 43 - 77 %   Neutro Abs 2.9 1.7 - 7.7 K/uL   Lymphocytes Relative 44 12 - 46 %   Lymphs Abs 2.8 0.7 - 4.0 K/uL   Monocytes Relative 7 3 - 12 %   Monocytes Absolute 0.4 0.1 - 1.0 K/uL   Eosinophils Relative 4 0 - 5 %   Eosinophils Absolute 0.3 0.0 - 0.7 K/uL   Basophils Relative 0 0 - 1 %   Basophils Absolute 0.0 0.0 - 0.1 K/uL   Smear Review Criteria for review not met   Comprehensive metabolic panel  Result Value Ref Range   Sodium 140 135 - 145 mEq/L   Potassium 4.0 3.5 - 5.3 mEq/L   Chloride 103 96 - 112 mEq/L   CO2 29 19 -  32 mEq/L   Glucose, Bld 82 70 - 99 mg/dL   BUN 15 6 - 23 mg/dL   Creat 0.62 0.50 - 1.10 mg/dL   Total Bilirubin 0.4 0.2 - 1.2 mg/dL   Alkaline Phosphatase 100 39 - 117 U/L   AST 23 0 - 37 U/L   ALT 19 0 - 35 U/L   Total Protein 6.7 6.0 - 8.3 g/dL   Albumin 4.1 3.5 - 5.2 g/dL   Calcium 9.1 8.4 - 10.5 mg/dL  TSH  Result Value Ref Range   TSH 1.410 0.350 - 4.500 uIU/mL  POCT UA - Microscopic Only  Result Value Ref Range   WBC, Ur, HPF, POC 0-1    RBC, urine, microscopic 10-14  Hx stones    Bacteria, U Microscopic 1+    Mucus, UA pos    Epithelial cells, urine per micros 0-1    Crystals, Ur, HPF, POC neg    Casts, Ur, LPF, POC neg    Yeast, UA neg   POCT urinalysis dipstick  Result Value Ref Range   Color, UA yellow    Clarity, UA clear    Glucose, UA neg    Bilirubin, UA neg     Ketones, UA trace    Spec Grav, UA 1.025    Blood, UA neg    pH, UA 5.5    Protein, UA neg    Urobilinogen, UA 0.2    Nitrite, UA neg    Leukocytes, UA Negative          Assessment & Plan:  Chronic fatigue - Plan: CBC with Differential/Platelet, Comprehensive metabolic panel, TSH  Likely deconditioning--will slowly incr exer  Abnormal urination - Plan: POCT UA - Microscopic Only, POCT urinalysis dipstick, Urine culture  Due to incont will have urol eval  Chronic pain--DDD lumbar and Cervical  requesting change in pain management as she would like to get off fentanyl  No meds needed today At next OV will need tadp,zostavax--review of paper chart re Pvax F/u dexa

## 2014-04-21 LAB — CBC WITH DIFFERENTIAL/PLATELET
BASOS ABS: 0 10*3/uL (ref 0.0–0.1)
BASOS PCT: 0 % (ref 0–1)
Eosinophils Absolute: 0.3 10*3/uL (ref 0.0–0.7)
Eosinophils Relative: 4 % (ref 0–5)
HCT: 38 % (ref 36.0–46.0)
Hemoglobin: 12.6 g/dL (ref 12.0–15.0)
Lymphocytes Relative: 44 % (ref 12–46)
Lymphs Abs: 2.8 10*3/uL (ref 0.7–4.0)
MCH: 29.2 pg (ref 26.0–34.0)
MCHC: 33.2 g/dL (ref 30.0–36.0)
MCV: 88.2 fL (ref 78.0–100.0)
MONO ABS: 0.4 10*3/uL (ref 0.1–1.0)
MPV: 10 fL (ref 8.6–12.4)
Monocytes Relative: 7 % (ref 3–12)
NEUTROS ABS: 2.9 10*3/uL (ref 1.7–7.7)
Neutrophils Relative %: 45 % (ref 43–77)
PLATELETS: 222 10*3/uL (ref 150–400)
RBC: 4.31 MIL/uL (ref 3.87–5.11)
RDW: 14.5 % (ref 11.5–15.5)
WBC: 6.4 10*3/uL (ref 4.0–10.5)

## 2014-04-21 LAB — COMPREHENSIVE METABOLIC PANEL
ALK PHOS: 100 U/L (ref 39–117)
ALT: 19 U/L (ref 0–35)
AST: 23 U/L (ref 0–37)
Albumin: 4.1 g/dL (ref 3.5–5.2)
BUN: 15 mg/dL (ref 6–23)
CHLORIDE: 103 meq/L (ref 96–112)
CO2: 29 meq/L (ref 19–32)
CREATININE: 0.62 mg/dL (ref 0.50–1.10)
Calcium: 9.1 mg/dL (ref 8.4–10.5)
Glucose, Bld: 82 mg/dL (ref 70–99)
Potassium: 4 mEq/L (ref 3.5–5.3)
SODIUM: 140 meq/L (ref 135–145)
Total Bilirubin: 0.4 mg/dL (ref 0.2–1.2)
Total Protein: 6.7 g/dL (ref 6.0–8.3)

## 2014-04-21 LAB — TSH: TSH: 1.41 u[IU]/mL (ref 0.350–4.500)

## 2014-04-23 LAB — URINE CULTURE: Colony Count: >=100000

## 2014-04-25 ENCOUNTER — Other Ambulatory Visit (HOSPITAL_COMMUNITY): Payer: Self-pay | Admitting: Otolaryngology

## 2014-04-25 DIAGNOSIS — R1310 Dysphagia, unspecified: Secondary | ICD-10-CM

## 2014-04-26 ENCOUNTER — Ambulatory Visit (INDEPENDENT_AMBULATORY_CARE_PROVIDER_SITE_OTHER): Payer: Medicare Other | Admitting: Psychiatry

## 2014-04-26 DIAGNOSIS — F332 Major depressive disorder, recurrent severe without psychotic features: Secondary | ICD-10-CM | POA: Diagnosis not present

## 2014-04-27 ENCOUNTER — Ambulatory Visit (HOSPITAL_COMMUNITY)
Admission: RE | Admit: 2014-04-27 | Discharge: 2014-04-27 | Disposition: A | Discharge status: Home / Self Care | Payer: Medicare Other | Source: Ambulatory Visit | Attending: Otolaryngology | Admitting: Otolaryngology

## 2014-04-27 DIAGNOSIS — R1310 Dysphagia, unspecified: Secondary | ICD-10-CM

## 2014-04-27 DIAGNOSIS — N189 Chronic kidney disease, unspecified: Secondary | ICD-10-CM | POA: Diagnosis not present

## 2014-04-27 DIAGNOSIS — R1312 Dysphagia, oropharyngeal phase: Secondary | ICD-10-CM | POA: Diagnosis not present

## 2014-04-27 DIAGNOSIS — M810 Age-related osteoporosis without current pathological fracture: Secondary | ICD-10-CM | POA: Diagnosis not present

## 2014-04-27 DIAGNOSIS — E119 Type 2 diabetes mellitus without complications: Secondary | ICD-10-CM | POA: Insufficient documentation

## 2014-04-27 DIAGNOSIS — K219 Gastro-esophageal reflux disease without esophagitis: Secondary | ICD-10-CM | POA: Insufficient documentation

## 2014-04-27 DIAGNOSIS — H3530 Unspecified macular degeneration: Secondary | ICD-10-CM | POA: Insufficient documentation

## 2014-04-27 DIAGNOSIS — I129 Hypertensive chronic kidney disease with stage 1 through stage 4 chronic kidney disease, or unspecified chronic kidney disease: Secondary | ICD-10-CM | POA: Diagnosis not present

## 2014-04-27 DIAGNOSIS — I739 Peripheral vascular disease, unspecified: Secondary | ICD-10-CM | POA: Insufficient documentation

## 2014-04-27 DIAGNOSIS — F0390 Unspecified dementia without behavioral disturbance: Secondary | ICD-10-CM | POA: Insufficient documentation

## 2014-04-27 DIAGNOSIS — T17900A Unspecified foreign body in respiratory tract, part unspecified causing asphyxiation, initial encounter: Secondary | ICD-10-CM | POA: Diagnosis not present

## 2014-04-27 NOTE — Progress Notes (Signed)
MBSS complete. Full report located under chart review in imaging section.  Shaneya Taketa Paiewonsky, M.A. CCC-SLP [phone]  

## 2014-05-03 ENCOUNTER — Ambulatory Visit: Payer: Medicare Other | Admitting: Psychiatry

## 2014-05-04 ENCOUNTER — Other Ambulatory Visit: Payer: Self-pay

## 2014-05-04 DIAGNOSIS — M545 Low back pain, unspecified: Secondary | ICD-10-CM

## 2014-05-04 DIAGNOSIS — M5137 Other intervertebral disc degeneration, lumbosacral region: Secondary | ICD-10-CM

## 2014-05-04 DIAGNOSIS — F411 Generalized anxiety disorder: Secondary | ICD-10-CM

## 2014-05-04 NOTE — Telephone Encounter (Signed)
Needs refills sent to [facility] these are the Rx she needs clonazePAM (KLONOPIN) 1 MG tablet, lidocaine (LIDODERM) 5 %, gabapentin (NEURONTIN) 300 MG capsule, Linaclotide (LINZESS) 145 MCG CAPS capsule, and sucralfate (CARAFATE) 1 GM/10ML suspension.  She also wanted to give Dr. [name] an update on her health:  Had catacrotic on left eye it went good the one on the right side didn't not go well, doctor found a lot of debris and everything, and may have to have it again, is having to wait 3 weeks to see if it fixes itself first. Also had 2nd neuro-eval and she does not have dementia just an anxiety disorder,and that it is ok for her to drive again. Also wanted to touch base about getting her into a new pain management clinic. Also wanted to know about some type of medication for her rash on her face, stated she brought it up to him in her last visit and she thought he was going to give her something for it but he didn't.

## 2014-05-05 NOTE — Telephone Encounter (Signed)
Patient is calling because she forgot to mention that she has a rash under both breast and middle of upper back. She wonder if she has shingles

## 2014-05-05 NOTE — Telephone Encounter (Signed)
Dr [name] please review. I'm not sure that you have Rxd all of these meds for pt before (carafate, Linzess?). I pended voltaren gel w/2 tubes per 90 days (don't know if ins will cover or not?).

## 2014-05-05 NOTE — Telephone Encounter (Signed)
Rx's pended. 

## 2014-05-05 NOTE — Telephone Encounter (Signed)
Patient is calling to let Dr. [name] know that she needs 2 tubes of voltran. Patient states that she as told 1 tube was supposed to last 3 months but it doesn't last nearly that long.

## 2014-05-06 ENCOUNTER — Ambulatory Visit (INDEPENDENT_AMBULATORY_CARE_PROVIDER_SITE_OTHER): Payer: Medicare Other | Admitting: Internal Medicine

## 2014-05-06 VITALS — BP 120/70 | HR 78 | Temp 98.1°F | Resp 15 | Ht 65.0 in | Wt 101.0 lb

## 2014-05-06 DIAGNOSIS — M5030 Other cervical disc degeneration, unspecified cervical region: Secondary | ICD-10-CM

## 2014-05-06 DIAGNOSIS — M791 Myalgia: Secondary | ICD-10-CM

## 2014-05-06 DIAGNOSIS — K2100 Gastro-esophageal reflux disease with esophagitis, without bleeding: Secondary | ICD-10-CM

## 2014-05-06 DIAGNOSIS — F411 Generalized anxiety disorder: Secondary | ICD-10-CM

## 2014-05-06 DIAGNOSIS — L304 Erythema intertrigo: Secondary | ICD-10-CM | POA: Diagnosis not present

## 2014-05-06 DIAGNOSIS — K5900 Constipation, unspecified: Secondary | ICD-10-CM | POA: Diagnosis not present

## 2014-05-06 DIAGNOSIS — M609 Myositis, unspecified: Secondary | ICD-10-CM | POA: Diagnosis not present

## 2014-05-06 DIAGNOSIS — IMO0001 Reserved for inherently not codable concepts without codable children: Secondary | ICD-10-CM

## 2014-05-06 DIAGNOSIS — K5909 Other constipation: Secondary | ICD-10-CM

## 2014-05-06 DIAGNOSIS — H359 Unspecified retinal disorder: Secondary | ICD-10-CM

## 2014-05-06 DIAGNOSIS — L219 Seborrheic dermatitis, unspecified: Secondary | ICD-10-CM | POA: Diagnosis not present

## 2014-05-06 MED ORDER — LIDOCAINE 5 % EX PTCH: 1.0000 | MEDICATED_PATCH | CUTANEOUS | Status: DC | PRN

## 2014-05-06 MED ORDER — DICLOFENAC SODIUM 1 % TD GEL: 2.0000 g | Freq: Four times a day (QID) | TRANSDERMAL | Status: DC

## 2014-05-06 MED ORDER — GABAPENTIN 300 MG PO CAPS: 300.0000 mg | ORAL_CAPSULE | Freq: Three times a day (TID) | ORAL | Status: DC

## 2014-05-06 MED ORDER — CLONAZEPAM 1 MG PO TABS: 0.5000 mg | ORAL_TABLET | Freq: Two times a day (BID) | ORAL | Status: DC | PRN

## 2014-05-06 MED ORDER — ZOSTER VACCINE LIVE 19400 UNT/0.65ML ~~LOC~~ SOLR: 0.6500 mL | Freq: Once | SUBCUTANEOUS | Status: DC

## 2014-05-06 MED ORDER — KETOCONAZOLE 2 % EX CREA: 1.0000 "application " | TOPICAL_CREAM | Freq: Every day | CUTANEOUS | Status: DC

## 2014-05-06 MED ORDER — LINACLOTIDE 145 MCG PO CAPS: 145.0000 ug | ORAL_CAPSULE | Freq: Every day | ORAL | Status: DC

## 2014-05-06 MED ORDER — SUCRALFATE 1 GM/10ML PO SUSP: 1.0000 g | Freq: Three times a day (TID) | ORAL | Status: DC

## 2014-05-06 NOTE — Progress Notes (Addendum)
Subjective:    Patient ID: Kayla [name], female    DOB: 1945/06/05, 69 y.o.   MRN: [id number]   This chart was scribed for Leandrew Koyanagi, MD by Stephania Fragmin, ED Scribe. This patient was seen in room 12 and the patient's care was started at 3:31 PM.   HPI .  Chief Complaint  Patient presents with  . Medication Refill    HPI Comments: Kayla [name] is a 69 y.o. female who presents to the [facility] for a medication refill.  Patient has been using Voltaren gel, which has been effective in alleviating her chronic pain (see last note by me).  Patient thinks she may have shingles as she has pruritic rashes underneath her bilateral breasts, on her left thigh, and on her back--the itchiest area is on her back. She has been using hydrocortisone cream with some relief.  Patient has gotten a referral to [facility]. See last office visit  Patient also complains of brown, stringy looking floaters in her eye. She had already had a cataract surgery that removed the first floater, but on Friday after, 2 floaters had re-appeared. Patient has already had a vitrectomy, but her ophthalmologist Dr. Tye Savoy recommends that she gets a second vitrectomy. She prefers not to do this, but she has not yet gotten a second opinion. Patient is unhappy because her first 2 eye surgeries have been ineffective in alleviating her problems, and she is distressed by the appearance of the 2 floaters, as she feels like she has "bugs in [her] eye."  Review of Systems  Constitutional: Negative for appetite change and unexpected weight change.  Eyes: Positive for visual disturbance.  Respiratory: Negative for shortness of breath.   Cardiovascular: Negative for chest pain and palpitations.  Gastrointestinal:       GERD symptoms controlled by Carafate  Musculoskeletal: Positive for neck pain.  Skin: Positive for rash.  Psychiatric/Behavioral: Negative for behavioral problems and sleep  disturbance. The patient is not nervous/anxious.        Objective:   Physical Exam  Constitutional: She is oriented to person, place, and time. She appears well-developed and well-nourished. No distress.  Eyes: Conjunctivae and EOM are normal. Pupils are equal, round, and reactive to light.  The left eyelid appears normal while the right has a slight droop There seems to be less volume about the right globe compared to the left but pupils are equal round reactive to light and accommodation extraocular movements conjugate  Neck:  Stiff movement tender left scapula and trapezius  Cardiovascular: Normal rate.   Pulmonary/Chest: Effort normal.  Neurological: She is alert and oriented to person, place, and time. No cranial nerve deficit.  Skin:  She has areas of dry skin on the back and left inner thigh. She has erythematous macular changes underneath her breasts.   Psychiatric: She has a normal mood and affect. Thought content normal.  Nursing note and vitals reviewed. BP 120/70 mmHg  Pulse 78  Temp(Src) 98.1 F (36.7 C) (Oral)  Resp 15  Ht 5\' 5"  (1.651 m)  Wt 101 lb (45.813 kg)  BMI 16.81 kg/m2  SpO2 98%      Assessment & Plan:  Retina disorder, right (complications of vitrectomy and recent cataract surgery)- Plan: Ambulatory referral to Ophthalmology [facility]  Anxiety state----stable for now also can refill Klonopin  Gastroesophageal reflux disease with esophagitis----Carafate has made a big difference so we'll continue this  DISC DISEASE, CERVICAL----recent injection didn't work so we'll  continue topical Voltaren which gives her significant relief  Myalgia and myositis  Chronic constipation---linzess working well  Seborrheic dermatitis face----ketoconazole  Intertrigo--[place] is all  Meds ordered this encounter  Medications  . ketoconazole (NIZORAL) 2 % cream    Sig: Apply 1 application topically daily. Under breasts    Dispense:  15 g    Refill:  0  . zoster vaccine  live, PF, (ZOSTAVAX) 91478 UNT/0.65ML injection    Sig: Inject 19,400 Units into the skin once. Administer at pharmacy    Dispense:  1 each    Refill:  0  . diclofenac sodium (VOLTAREN) 1 % GEL    Sig: Apply 2 g topically 4 (four) times daily.    Dispense:  100 g    Refill:  1-----go ahead and fill now   . diclofenac sodium (VOLTAREN) 1 % GEL    Sig: Apply 2 g topically 4 (four) times daily.    Dispense:  200 g    Refill:  3--- for mail order   For mail order prescriptions we also reordered: -Sucralfate lenziss Lidoderm Clonopin and gabapentin  I have completed the patient encounter in its entirety as documented by the scribe, with editing by me where necessary. Blythe Hartshorn P. [name], M.D.

## 2014-05-07 ENCOUNTER — Telehealth: Payer: Self-pay

## 2014-05-07 NOTE — Telephone Encounter (Signed)
RX REFILL SOLIFENACIN SUCCINATE 5 MG

## 2014-05-09 MED ORDER — SOLIFENACIN SUCCINATE 5 MG PO TABS: 5.0000 mg | ORAL_TABLET | Freq: Every day | ORAL | Status: DC

## 2014-05-09 NOTE — Telephone Encounter (Signed)
Pt notified that rx called in.

## 2014-05-09 NOTE — Telephone Encounter (Signed)
Meds ordered this encounter  Medications  . solifenacin (VESICARE) 5 MG tablet    Sig: Take 1 tablet (5 mg total) by mouth daily.    Dispense:  90 tablet    Refill:  3

## 2014-05-09 NOTE — Telephone Encounter (Signed)
Im not sure what this medication is.

## 2014-05-10 ENCOUNTER — Other Ambulatory Visit: Payer: Self-pay | Admitting: Radiology

## 2014-05-10 ENCOUNTER — Ambulatory Visit (INDEPENDENT_AMBULATORY_CARE_PROVIDER_SITE_OTHER): Payer: Medicare Other | Admitting: Psychiatry

## 2014-05-10 DIAGNOSIS — F332 Major depressive disorder, recurrent severe without psychotic features: Secondary | ICD-10-CM | POA: Diagnosis not present

## 2014-05-10 MED ORDER — SOLIFENACIN SUCCINATE 5 MG PO TABS: 5.0000 mg | ORAL_TABLET | Freq: Every day | ORAL | Status: DC

## 2014-05-17 ENCOUNTER — Ambulatory Visit (INDEPENDENT_AMBULATORY_CARE_PROVIDER_SITE_OTHER): Payer: Medicare Other | Admitting: Psychiatry

## 2014-05-17 DIAGNOSIS — F332 Major depressive disorder, recurrent severe without psychotic features: Secondary | ICD-10-CM | POA: Diagnosis not present

## 2014-05-24 ENCOUNTER — Ambulatory Visit (INDEPENDENT_AMBULATORY_CARE_PROVIDER_SITE_OTHER): Payer: Medicare Other | Admitting: Psychiatry

## 2014-05-24 DIAGNOSIS — F332 Major depressive disorder, recurrent severe without psychotic features: Secondary | ICD-10-CM | POA: Diagnosis not present

## 2014-05-27 ENCOUNTER — Encounter: Payer: Self-pay | Admitting: Gastroenterology

## 2014-05-31 ENCOUNTER — Ambulatory Visit (INDEPENDENT_AMBULATORY_CARE_PROVIDER_SITE_OTHER): Payer: Medicare Other | Admitting: Psychiatry

## 2014-05-31 DIAGNOSIS — F332 Major depressive disorder, recurrent severe without psychotic features: Secondary | ICD-10-CM

## 2014-06-09 ENCOUNTER — Ambulatory Visit: Payer: Medicare Other | Admitting: Psychiatry

## 2014-06-14 ENCOUNTER — Ambulatory Visit (INDEPENDENT_AMBULATORY_CARE_PROVIDER_SITE_OTHER): Payer: Medicare Other | Admitting: Psychiatry

## 2014-06-14 DIAGNOSIS — F332 Major depressive disorder, recurrent severe without psychotic features: Secondary | ICD-10-CM

## 2014-06-15 DIAGNOSIS — M542 Cervicalgia: Secondary | ICD-10-CM | POA: Diagnosis not present

## 2014-06-15 DIAGNOSIS — M797 Fibromyalgia: Secondary | ICD-10-CM | POA: Diagnosis not present

## 2014-06-21 ENCOUNTER — Encounter: Payer: Self-pay | Admitting: Physical Medicine & Rehabilitation

## 2014-06-21 ENCOUNTER — Ambulatory Visit (INDEPENDENT_AMBULATORY_CARE_PROVIDER_SITE_OTHER): Payer: Medicare Other | Admitting: Psychiatry

## 2014-06-21 DIAGNOSIS — F332 Major depressive disorder, recurrent severe without psychotic features: Secondary | ICD-10-CM | POA: Diagnosis not present

## 2014-06-28 ENCOUNTER — Ambulatory Visit (INDEPENDENT_AMBULATORY_CARE_PROVIDER_SITE_OTHER): Payer: Medicare Other | Admitting: Psychiatry

## 2014-06-28 DIAGNOSIS — F332 Major depressive disorder, recurrent severe without psychotic features: Secondary | ICD-10-CM

## 2014-07-05 ENCOUNTER — Ambulatory Visit: Payer: Medicare Other | Admitting: Psychiatry

## 2014-07-08 ENCOUNTER — Ambulatory Visit (INDEPENDENT_AMBULATORY_CARE_PROVIDER_SITE_OTHER): Payer: Medicare Other | Admitting: Internal Medicine

## 2014-07-08 ENCOUNTER — Other Ambulatory Visit: Payer: Self-pay

## 2014-07-08 VITALS — BP 90/62 | HR 78 | Temp 98.7°F | Resp 16 | Ht 65.0 in | Wt 103.0 lb

## 2014-07-08 DIAGNOSIS — K319 Disease of stomach and duodenum, unspecified: Secondary | ICD-10-CM | POA: Diagnosis not present

## 2014-07-08 DIAGNOSIS — K3189 Other diseases of stomach and duodenum: Secondary | ICD-10-CM

## 2014-07-08 DIAGNOSIS — R079 Chest pain, unspecified: Secondary | ICD-10-CM

## 2014-07-08 DIAGNOSIS — F411 Generalized anxiety disorder: Secondary | ICD-10-CM

## 2014-07-08 DIAGNOSIS — K2100 Gastro-esophageal reflux disease with esophagitis, without bleeding: Secondary | ICD-10-CM

## 2014-07-08 DIAGNOSIS — K449 Diaphragmatic hernia without obstruction or gangrene: Secondary | ICD-10-CM | POA: Diagnosis not present

## 2014-07-08 DIAGNOSIS — R002 Palpitations: Secondary | ICD-10-CM | POA: Diagnosis not present

## 2014-07-08 MED ORDER — DICLOFENAC SODIUM 1 % TD GEL: 2.0000 g | Freq: Four times a day (QID) | TRANSDERMAL | Status: DC

## 2014-07-08 MED ORDER — CELECOXIB 200 MG PO CAPS: 200.0000 mg | ORAL_CAPSULE | Freq: Every day | ORAL | Status: DC

## 2014-07-08 MED ORDER — DULOXETINE HCL 30 MG PO CPEP: 30.0000 mg | ORAL_CAPSULE | Freq: Every day | ORAL | Status: DC

## 2014-07-08 MED ORDER — TRAZODONE HCL 150 MG PO TABS: 150.0000 mg | ORAL_TABLET | Freq: Every day | ORAL | Status: DC

## 2014-07-08 MED ORDER — ATENOLOL 50 MG PO TABS: 50.0000 mg | ORAL_TABLET | Freq: Every day | ORAL | Status: DC

## 2014-07-08 MED ORDER — ESOMEPRAZOLE MAGNESIUM 40 MG PO CPDR: 40.0000 mg | DELAYED_RELEASE_CAPSULE | Freq: Every day | ORAL | Status: DC

## 2014-07-08 MED ORDER — CYCLOBENZAPRINE HCL 10 MG PO TABS: 10.0000 mg | ORAL_TABLET | Freq: Three times a day (TID) | ORAL | Status: DC | PRN

## 2014-07-08 NOTE — Progress Notes (Signed)
Subjective:  This chart was scribed for Kayla Lin, MD by Leandra Kern, Medical Scribe. This patient was seen in Room 9 and the patient's care was started at 1:27 PM.   Patient ID: Kayla [name], female    DOB: 07/30/1945, 69 y.o.   MRN: [id number]  HPI HPI Comments: Kayla [name] is a 69 y.o. female who presents to [facility] complaining of chest pain. She reports that she has been having chest pain for a while now, however, it has been getting worse recently. Pt notes that she has a waxing and waning cardiac palpitations episodes that last for a while. She indicates that these episodes may wake her up at night. She notes associative symptoms of tremors (worse than normal), and shortness of breath in addition to the chest pain.   She has a history of Roger valve prolapse identified 40+ years ago. She is no longer on beta blockers but used this medication for a number of years. She denies having diaphoresis or  Nausea.  Pt notes taking fentanyl patch per Dr Vilinda Flake for her neck pain. The pt does reports interest in stopping using the patches, however she is concerned that the pain would become even more severe without the patch. She had a problem with her patch today as it got wet and she is concerned about over absorption of medicine if she uses it.   Pt expressed the need for a few med refills for her ongoing problems.  Nexium, trazodone, Cymbalta, Celebrex, and diclofenac gel.   Review of Systems  Constitutional: Negative for diaphoresis.  Respiratory: Positive for shortness of breath.   Cardiovascular: Positive for chest pain and palpitations.  Gastrointestinal: Negative for nausea.  Neurological: Positive for tremors.       Objective:   Physical Exam  Constitutional: She is oriented to person, place, and time. She appears well-developed. No distress.  Very thin as in the past  HENT:  Head: Normocephalic and atraumatic.  Mouth/Throat: Oropharynx  is clear and moist.  Eyes: EOM are normal. Pupils are equal, round, and reactive to light.  Neck: Neck supple. No thyromegaly present.  Cardiovascular: Normal rate, regular rhythm, normal heart sounds and intact distal pulses.   No murmur heard. No click  Pulmonary/Chest: Effort normal and breath sounds normal. No respiratory distress. She has no wheezes. She exhibits no tenderness.  Abdominal: Bowel sounds are normal. There is no tenderness.  Lymphadenopathy:    She has no cervical adenopathy.  Neurological: She is alert and oriented to person, place, and time. No cranial nerve deficit.  Skin: Skin is warm and dry.  Psychiatric: She has a normal mood and affect. Her behavior is normal.  Nursing note and vitals reviewed.  BP 90/62 mmHg  Pulse 78  Temp(Src) 98.7 F (37.1 C) (Oral)  Resp 16  Ht 5\' 5"  (1.651 m)  Wt 103 lb (46.72 kg)  BMI 17.14 kg/m2  SpO2 98%   EKG within normal limits    Assessment & Plan:  I have completed the patient encounter in its entirety as documented by the scribe, with editing by me where necessary. Boykin Baetz P. [name], M.D.  Problem #1 palpitations with episodic chest pain sounding like paroxysmal atrial tachycardia She has been evaluated in the past by Dr.Nahser--will establish referral so she can have Holter monitor and further workup as indicated. Start atenolol for now.  Problem #2 chronic pain-neck and other areas -Question about use of fentanyl patch////I advised her there  will be no over absorption and that she can use this patch. If she has problems with underabsorption then she can take  Flexeril over the next 72 hours for additional pain control. At the advice of Dr Vilinda Flake we will restart Cymbalta and trazodone which she had chosen to discontinue. Also diclofenac gel refilled and Celebrex reordered.  Problem #3 GERD-fill Nexium  Meds ordered this encounter  Medications  . traZODone (DESYREL) 150 MG tablet    Sig: Take 1 tablet (150 mg  total) by mouth at bedtime. And 1/2 -1 if wakes at 4am.    Dispense:  180 tablet    Refill:  3  . esomeprazole (NEXIUM) 40 MG capsule    Sig: Take 1 capsule (40 mg total) by mouth daily before breakfast.    Dispense:  90 capsule    Refill:  3  . diclofenac sodium (VOLTAREN) 1 % GEL    Sig: Apply 2 g topically 4 (four) times daily.    Dispense:  200 g    Refill:  3  . DULoxetine (CYMBALTA) 30 MG capsule    Sig: Take 1 capsule (30 mg total) by mouth daily.    Dispense:  90 capsule    Refill:  3  . celecoxib (CELEBREX) 200 MG capsule    Sig: Take 1 capsule (200 mg total) by mouth daily.    Dispense:  90 capsule    Refill:  3  . atenolol (TENORMIN) 50 MG tablet    Sig: Take 1 tablet (50 mg total) by mouth daily.    Dispense:  30 tablet    Refill:  1  . cyclobenzaprine (FLEXERIL) 10 MG tablet    Sig: Take 1 tablet (10 mg total) by mouth 3 (three) times daily as needed for muscle spasms.    Dispense:  21 tablet    Refill:  0

## 2014-07-11 ENCOUNTER — Other Ambulatory Visit: Payer: Self-pay

## 2014-07-12 ENCOUNTER — Ambulatory Visit: Payer: Self-pay | Admitting: Cardiovascular Disease

## 2014-07-12 DIAGNOSIS — H524 Presbyopia: Secondary | ICD-10-CM | POA: Diagnosis not present

## 2014-07-12 DIAGNOSIS — H26492 Other secondary cataract, left eye: Secondary | ICD-10-CM | POA: Diagnosis not present

## 2014-07-13 DIAGNOSIS — M542 Cervicalgia: Secondary | ICD-10-CM | POA: Diagnosis not present

## 2014-07-13 DIAGNOSIS — M797 Fibromyalgia: Secondary | ICD-10-CM | POA: Diagnosis not present

## 2014-07-15 ENCOUNTER — Ambulatory Visit (INDEPENDENT_AMBULATORY_CARE_PROVIDER_SITE_OTHER): Payer: Medicare Other

## 2014-07-15 ENCOUNTER — Encounter: Payer: Self-pay | Admitting: Cardiovascular Disease

## 2014-07-15 ENCOUNTER — Telehealth: Payer: Self-pay

## 2014-07-15 ENCOUNTER — Ambulatory Visit (INDEPENDENT_AMBULATORY_CARE_PROVIDER_SITE_OTHER): Payer: Medicare Other | Admitting: Cardiovascular Disease

## 2014-07-15 ENCOUNTER — Other Ambulatory Visit: Payer: Self-pay

## 2014-07-15 ENCOUNTER — Ambulatory Visit (HOSPITAL_COMMUNITY): Payer: Medicare Other | Attending: Internal Medicine

## 2014-07-15 VITALS — BP 74/56 | HR 71 | Ht 65.0 in | Wt 101.8 lb

## 2014-07-15 DIAGNOSIS — R079 Chest pain, unspecified: Secondary | ICD-10-CM

## 2014-07-15 DIAGNOSIS — E440 Moderate protein-calorie malnutrition: Secondary | ICD-10-CM

## 2014-07-15 DIAGNOSIS — R002 Palpitations: Secondary | ICD-10-CM

## 2014-07-15 NOTE — Telephone Encounter (Signed)
Ok to ref to nutrition

## 2014-07-15 NOTE — Progress Notes (Signed)
Kayla [name] Date of Birth  [birth year]       [facility] Office [address], [address], [place], [place]  [address]   [place], [place]  [address] [phone]     [phone]   Fax  [phone]    Fax [phone]  Problem List: 1. Mitral valve prolapse-mitral regurgitation 2. Hypertension 3. Barrett's esophagus 4. Fibromyalgia 5. History chest pain-she had a normal heart catheterization in 1993. Stress Myoview study in 2008 was normal. 6. Brain aneurism -  7. S/p Roux-en-Y surgery  for Barretts esophagus  History of Present Illness: Kayla [name] continues to have chest pain.  She takes PRN propranolol which seems to help.  It typically occurs in the afternoon.  Almost always with rest.  She does not get any regular exercise.  She has had lots of bladder infections due to kidney stones recently that have not permitted her to exercise.  She's had lots of problems with anxiety.  July 15, 2014:  Kayla [name] presents for evaluation Her BP has been very low - possibly due to the various medications .  She has had left sided chest pain for years.  Has had a normal cath in the remote past and had a normal myoview  Several years ago .   She was recently seen by Dr. Newman Pies for some palpitations The palpitations would come and go .  Off and on for hours.  Was started on atenolol which has helped the palpitations. She had taken atenolol for years ( many years ago) but was off atenolol for several years.  Her palpitations have resolved after starting the atenolol .    These episodes of tachycardia are at times associated with episodes of CP . Has lost a lot of weight over the past several years. She had a Roux -in - Y gastric bypass ( supposedly to prevent esophageal cancer from Barretts esophagutis. Is not able to eat much.  Can eat 1/4 cup of food at a time .     Current Outpatient Prescriptions on File Prior to Visit  Medication Sig  Dispense Refill  . Ascorbic Acid (VITAMIN C) 1000 MG tablet Take 1,000 mg by mouth daily.    Marland Kitchen atenolol (TENORMIN) 50 MG tablet Take 1 tablet (50 mg total) by mouth daily. 30 tablet 1  . beta carotene w/minerals (OCUVITE) tablet Take 1 tablet by mouth 2 (two) times daily.    . butalbital-acetaminophen-caffeine (FIORICET, ESGIC) 50-325-40 MG per tablet Take 1 tablet by mouth 3 (three) times daily. Headache 270 tablet 3  . CALCIUM-MAGNESIUM-ZINC PO Take by mouth daily.    . celecoxib (CELEBREX) 200 MG capsule Take 1 capsule (200 mg total) by mouth daily. 90 capsule 3  . Cholecalciferol (VITAMIN D3) 2000 UNITS TABS Take 2,000 Units by mouth daily.    . clonazePAM (KLONOPIN) 1 MG tablet Take 0.5-1 tablets (0.5-1 mg total) by mouth 2 (two) times daily as needed. anxiety/panic attacks 180 tablet 3  . cyclobenzaprine (FLEXERIL) 10 MG tablet Take 1 tablet (10 mg total) by mouth 3 (three) times daily as needed for muscle spasms. 21 tablet 0  . diclofenac sodium (VOLTAREN) 1 % GEL Apply 2 g topically 4 (four) times daily. 100 g 1  . dicyclomine (BENTYL) 10 MG capsule Take 1-2 tab 3 times daily as needed for spasms and cramping. 270 capsule 3  . diltiazem 2 % GEL Apply 1 application topically 2 (two) times daily.  30 g 0  . docusate sodium (COLACE) 250 MG capsule Take 250 mg by mouth daily.    . DULoxetine (CYMBALTA) 30 MG capsule Take 1 capsule (30 mg total) by mouth daily. 90 capsule 3  . esomeprazole (NEXIUM) 40 MG capsule Take 1 capsule (40 mg total) by mouth daily before breakfast. 90 capsule 3  . fentaNYL (DURAGESIC - DOSED MCG/HR) 75 MCG/HR Place 1 patch onto the skin every 3 (three) days.    . Flaxseed, Linseed, (FLAX SEED OIL) 1000 MG CAPS Take 1,000 mg by mouth daily.    Marland Kitchen gabapentin (NEURONTIN) 300 MG capsule Take 1 capsule (300 mg total) by mouth 3 (three) times daily. 270 capsule 3  . HYDROcodone-acetaminophen (NORCO) 7.5-325 MG per tablet Take 1 tablet by mouth every 6 (six) hours as needed.  Pain     . ipratropium (ATROVENT) 0.06 % nasal spray Place 2 sprays into both nostrils 2 (two) times daily. 15 mL 12  . ketoconazole (NIZORAL) 2 % cream Apply 1 application topically daily. Under breasts 15 g 0  . lidocaine (LIDODERM) 5 % Place 1 patch onto the skin as needed. Remove & Discard patch within 12 hours. May dispense as 3 month supply 90 patch 3  . Linaclotide (LINZESS) 145 MCG CAPS capsule Take 1 capsule (145 mcg total) by mouth daily. 90 capsule 3  . Omega-3 Fatty Acids (SEA-OMEGA PO) Take 1,000 mg by mouth daily.    Marland Kitchen pyridoxine (B-6) 200 MG tablet Take 200 mg by mouth daily.    . solifenacin (VESICARE) 5 MG tablet Take 1 tablet (5 mg total) by mouth daily. 90 tablet 3  . sucralfate (CARAFATE) 1 GM/10ML suspension Take 10 mLs (1 g total) by mouth 4 (four) times daily -  with meals and at bedtime. 420 mL 3  . traZODone (DESYREL) 150 MG tablet Take 1 tablet (150 mg total) by mouth at bedtime. And 1/2 -1 if wakes at 4am. 180 tablet 3  . zoster vaccine live, PF, (ZOSTAVAX) 36644 UNT/0.65ML injection Inject 19,400 Units into the skin once. Administer at pharmacy 1 each 0  . [DISCONTINUED] buPROPion (WELLBUTRIN) 75 MG tablet Take 75 mg by mouth daily after breakfast.      No current facility-administered medications on file prior to visit.    Allergies  Allergen Reactions  . Codeine Anaphylaxis  . Oxycodone-Acetaminophen Shortness Of Breath  . Tylox [Oxycodone-Acetaminophen] Anaphylaxis    Chest pain  . Darvocet [Propoxyphene N-Acetaminophen]     Chest pain  . Darvon [Propoxyphene] Itching  . Food Swelling    Peanuts-wheat-  . Lorcet [Hydrocodone-Acetaminophen]     Chest pain  . Opium     hallucinations  . Wheat Bran   . Zolpidem Tartrate Other (See Comments)    Makes her go crazy  . Zolpidem Tartrate     hallucinations  . Amoxicillin Nausea And Vomiting    Reaction:  Migraine headache  . Penicillins Nausea And Vomiting    Migraine Headaches    Past Medical History   Diagnosis Date  . Barrett esophagus     not consistently present  . History of kidney stones   . Anxiety   . IBS (irritable bowel syndrome)   . Complication of anesthesia     either  BP or pulse dropped with last surgery  . Angina     takes Propanolol prn and it stops her chest  . Chronic kidney disease     kidney stones and UTI  . Depression  post traumatic stress  disorder  . Hypertension     3 small brain aneurysms  . GERD (gastroesophageal reflux disease)   . DDD (degenerative disc disease)     cervical and lumbar  . Osteoarthritis     all over  . Headache(784.0)     migraines  . Dysrhythmia     brought on by stress  . Peripheral vascular disease     superficial phlebitis in left calf  . Diabetes mellitus     sugar goes up and down diet controlled  . Anemia     anemia in the past  . Fibromyalgia     neuropathy knees ankles and toes, bladder  . Hiatal hernia   . H/O hyperparathyroidism   . Osteopetrosis   . Allergy   . Osteoporosis   . Dementia   . Macular degeneration   . Fatigue   . Headache   . Migraines   . Personal history of colonic polyps 07/22/2013    07/2013 - 3 small adenomas - repeat colonoscopy 2018    Past Surgical History  Procedure Laterality Date  . US echocardiography  01/21/2007    EF 55-60%  . US echocardiography  08/30/2003    EF 55-60%  . Cardiovascular stress test  10/03/2006  . Esophagus surgery    . Gastric bypass  1999  . Appendectomy    . Cholecystectomy    . Abdominal hysterectomy    . Gastric restriction surgery  1991  . Tubal ligation    . Right knee arthroscopy    . Right arm surgery    . Upper gastrointestinal endoscopy  06/23/2008    w/Dil, Barrett's esophagus  . Colonoscopy  06/23/2008    normal  . Tonsillectomy    . Cardiac catheterization  03/22/1991    EF 61%  . Stomach stappeling  1991  . Dilation and curettage of uterus      x2  . Exploratory laparotomy  1993  . Stone extraction with basket  2012  . Rt wrist  fx  2009    History  Smoking status  . Never Smoker   Smokeless tobacco  . Never Used    History  Alcohol Use No    Family History  Problem Relation Age of Onset  . Stroke Mother   . Lung cancer Mother   . Heart disease Mother   . Diabetes Mother   . Hypertension Father   . Stroke Father   . Lung cancer Father   . Heart disease Father   . Kidney disease Father   . Seizures Father     epilepsy, and sisters x 2  . Pancreatic cancer Sister   . Lung cancer Sister   . Colon cancer Maternal Aunt     12 relatives  . Uterine cancer      aunt  . Heart disease      grandmother  . Clotting disorder Sister   . Heart disease Sister     x 2  . Kidney disease Sister     x 2  . Breast cancer Sister   . Colon cancer Paternal Aunt   . Colon cancer Paternal Aunt     Reviw of Systems:  Reviewed in the HPI.  All other systems are negative.  Physical Exam: Blood pressure 74/56, pulse 71, height 5\' 5"  (1.651 m), weight 46.176 kg (101 lb 12.8 oz). General: Well developed, well nourished, in no acute distress.  Head: Normocephalic, atraumatic, sclera non-icteric, mucus membranes  are moist,   Neck: Supple. Carotids are 2 + without bruits. No JVD  Lungs: Clear bilaterally to auscultation.  Heart: regular rate.  normal  S1 S2. She has a very soft systolic murmur.   Abdomen: Soft, non-tender, non-distended with normal bowel sounds. No hepatomegaly. No rebound/guarding. No masses.  Msk:  Strength and tone are normal  Extremities: No clubbing or cyanosis. No edema.  Distal pedal pulses are 2+ and equal bilaterally.  Neuro: Alert and oriented X 3. Moves all extremities spontaneously.  Psych:  Responds to questions appropriately with a normal affect.  ECG: 07/08/14:   NSR , no ST or T Wave changes.   Assessment / Plan:   1.  Chest pain: The patient is a episodes of chest pain that are associated with her palpitations.  She had an echocardiogram that reveals: Left ventricle:  The cavity size was normal. Wall thickness was normal. Systolic function was normal. The estimated ejection fraction was in the range of 55% to 60%. Wall motion was normal; there were no regional wall motion abnormalities. Left ventricular diastolic function parameters were normal. - Mitral valve: Mildly thickened leaflets . There was trivial regurgitation. - Left atrium: The atrium was normal in size  Her symptoms do not sound like angina.  These episodes of pain seem to occur when she has palpitations. I think that her primary issues severe malnutrition and volume depletion.  2. Hypotension: I suspect that she is volume  depleted. She admits that her dietary intake is fairly poor.  3. Palpitations:  Will place a 30 day event monitor on her.   Her echo is normal .     Annalina Needles, Wonda Cheng, MD  07/15/2014 5:10 PM    [address],  [place], [place]  [address] Pager [phone] Phone: [phone]; Fax: [phone]   [facility]  [address] [place], [place]  [address] [phone]    Fax [phone]

## 2014-07-15 NOTE — Patient Instructions (Signed)
Medication Instructions:  Your physician recommends that you continue on your current medications as directed. Please refer to the Current Medication list given to you today.   Labwork: None Ordered   Testing/Procedures: Your physician has recommended that you wear an event monitor. Event monitors are medical devices that record the heart's electrical activity. Doctors most often [place] these monitors to diagnose arrhythmias. Arrhythmias are problems with the speed or rhythm of the heartbeat. The monitor is a small, portable device. You can wear one while you do your normal daily activities. This is usually used to diagnose what is causing palpitations/syncope (passing out).  Your physician has requested that you have an echocardiogram. Echocardiography is a painless test that uses sound waves to create images of your heart. It provides your doctor with information about the size and shape of your heart and how well your heart's chambers and valves are working. This procedure takes approximately one hour. There are no restrictions for this procedure.   Follow-Up: Your physician recommends that you schedule a follow-up appointment in: 3 months with Dr. Acie Fredrickson.

## 2014-07-15 NOTE — Telephone Encounter (Signed)
Pt. Would like to let Dr. [name] know that Dr. Acie Fredrickson thinks that the pt's heart problems are in relation to her malnutrition; he wants her to see a nutritionist again.

## 2014-07-18 NOTE — Telephone Encounter (Signed)
Put in referral/ called patient to advise. Left message.

## 2014-07-26 ENCOUNTER — Ambulatory Visit (INDEPENDENT_AMBULATORY_CARE_PROVIDER_SITE_OTHER): Payer: Medicare Other | Admitting: Psychiatry

## 2014-07-26 ENCOUNTER — Encounter: Payer: Self-pay | Admitting: Genetic Counselor

## 2014-07-26 DIAGNOSIS — F332 Major depressive disorder, recurrent severe without psychotic features: Secondary | ICD-10-CM

## 2014-08-01 ENCOUNTER — Encounter: Payer: Self-pay | Admitting: Genetic Counselor

## 2014-08-02 ENCOUNTER — Ambulatory Visit (INDEPENDENT_AMBULATORY_CARE_PROVIDER_SITE_OTHER): Payer: Medicare Other | Admitting: Psychiatry

## 2014-08-02 DIAGNOSIS — F332 Major depressive disorder, recurrent severe without psychotic features: Secondary | ICD-10-CM

## 2014-08-04 ENCOUNTER — Encounter: Payer: Self-pay | Admitting: Physical Medicine & Rehabilitation

## 2014-08-04 ENCOUNTER — Encounter: Payer: Self-pay | Admitting: Skilled Nursing Facility1

## 2014-08-04 ENCOUNTER — Encounter: Payer: Medicare Other | Attending: Internal Medicine | Admitting: Skilled Nursing Facility1

## 2014-08-04 VITALS — Ht 65.0 in | Wt 101.0 lb

## 2014-08-04 DIAGNOSIS — Z713 Dietary counseling and surveillance: Secondary | ICD-10-CM | POA: Insufficient documentation

## 2014-08-04 DIAGNOSIS — R634 Abnormal weight loss: Secondary | ICD-10-CM | POA: Diagnosis not present

## 2014-08-04 NOTE — Patient Instructions (Addendum)
-  Make an appointment with your GI doctor; discuss Nexium and the fact it gives you headaches -Moisten foods: gravies, whole milk, creames, olive oil, and sauces -Set an alarm every 3 hours to remind you to eat -Forget Diabetes! -Make your food pretty like when you catered  -Think about the energy you used to have; the energy neccessary to do the things you love to do -Try Kefir-in the yogurt

## 2014-08-04 NOTE — Progress Notes (Signed)
  Medical Nutrition Therapy:  Appt start time: 2:00 end time: 3:00  Assessment:  Primary concerns today referred for malnutrition: Previously worked with Roxan Hockey RD,LDN (2015) and Duffy Rhody MS, RD,LDN (2014). Gastric bypass in 1999, botched according to the pt which caused twisted intestines. Medically retired in 2005. Pt states due to a hyatal hernia her food gets stuck (points to her chest): rice, some meats, spagetti,  dry foods so she forces herself to vomit so she can breath again. Pt states she saw a GI specialist one year ago. Pt states within the first few bites her stomach destends like she is 4 months pregnant with stabbing pains throughout her intestines and lower stomach; goes a month without a bowel movement so she does enemas. Pt states she has a "mega colon." Pt states stains in her underwear from anus not closing. Pt states her psychologist is the only physicaian that has spoken about surgery for her hernias and anus. Pt states she Pulls out her own stool and has ripped her own vagina attempting this. Pt states she gets Migrans from nexium. Pt states she Does not eat carbhohrdates due to her diabetes and states she fears them and is afraid of them. Pt states she is afraid of eating because she fears gaining wt. Pts Apetite: none, can go all day without eating. Pt narrows in on amount of protein. Whey protein shake with protein powder, 2 cups of strawberries, 2 cups of whole milk-35 grams of protein Pt was severely wasted and emaciated (fat and muscle wasting).   Preferred Learning Style:   No preference indicated   Learning Readiness:  Not ready  MEDICATIONS: See List   DIETARY INTAKE:  24-hr recall:  B ( AM): shake Snk ( AM): sandwhich-mustard and 1 piece of bread-mostly a half L ( PM): shake Snk ( PM): small potato, meat, and vegetable D ( PM): shake Snk ( PM): none Beverages: water  Usual physical activity: none  Estimated energy needs: 1600  calories 180 g carbohydrates 120 g protein 44 g fat  Progress Towards Goal(s):  In progress.   Nutritional Diagnosis:  [place]-3.1 Underweight As related to lack of consumption of calories.  As evidenced by fear of haveing out of control diabetes, gaining wt, and 16.81.    Intervention:  Nutrition counseling for malnutrition. Dietitian focused the appointment around eating in general and not focusing on carbohydrates or protein. Dietitian also educated the pt on the importance of eating and her bodies reaction to a lack of calories/nutrients.   Goals:  -Make an appointment with your GI doctor; discuss Nexium and the fact it gives you headaches -Moisten foods: gravies, whole milk, creames, olive oil, and sauces -Set an alarm every 3 hours to remind you to eat -Forget Diabetes! -Make your food pretty like when you catered  -Think about the energy you used to have; the energy neccessary to do the things you love to do -Try Kefir-in the yogurt   Barriers to learning/adherence to lifestyle change: mental barriers  Demonstrated degree of understanding via:  Teach Back   Monitoring/Evaluation:  Dietary intake and body weight in 1 month(s).

## 2014-08-09 ENCOUNTER — Ambulatory Visit: Payer: Medicare Other | Admitting: Psychiatry

## 2014-08-18 ENCOUNTER — Telehealth: Payer: Self-pay | Admitting: Nurse Practitioner

## 2014-08-18 MED ORDER — METOPROLOL TARTRATE 25 MG PO TABS: 25.0000 mg | ORAL_TABLET | Freq: Two times a day (BID) | ORAL | Status: DC

## 2014-08-18 MED ORDER — PROPRANOLOL HCL 10 MG PO TABS: 10.0000 mg | ORAL_TABLET | Freq: Four times a day (QID) | ORAL | Status: DC

## 2014-08-18 NOTE — Telephone Encounter (Signed)
-----   Message from Thayer Headings, MD sent at 08/18/2014 11:14 AM EDT ----- NSR ,  No significant arrhythmias

## 2014-08-18 NOTE — Telephone Encounter (Signed)
Called patient to review cardiac monitor results.  Patient reports she continues to have palpitations and fast heart rate.  Reports heart rate has been >110 bpm on occasion and she notices the increased palpitations and not feeling well during these times.  States her BP has been [id number] mmHg systolic and [id number] mmHg diastolic which is high for her.  I reviewed the monitor report and advised her of average heart rate primarily in the 80's bpm; only 10% of time was heart rate > 100 bpm throughout the 30 days.  I reviewed her medications and advised that Dr. Acie Fredrickson may want to have her d/c Atenolol and take a different beta blocker.  I spoke with Dr. Acie Fredrickson and he advised that patient may stop Atenolol and start Lopressor 25 mg BID and may take Propranolol 10 mg up to 4 times per day as needed for palpitations; he recommends she decrease Lopressor to 12.5 mg BID if she experiences dizziness.  I advised that I will send a 30 day supply to her local pharmacy and if it is determined at her follow-up appointment on 9/14, that 90 day supply will be sent to [facility] per her request.  She verbalized understanding and agreement with plan.

## 2014-08-23 ENCOUNTER — Ambulatory Visit: Payer: Medicare Other | Admitting: Psychiatry

## 2014-08-29 ENCOUNTER — Encounter: Payer: Medicare Other | Admitting: Physical Medicine & Rehabilitation

## 2014-08-31 ENCOUNTER — Ambulatory Visit: Payer: Self-pay | Admitting: Skilled Nursing Facility1

## 2014-09-05 ENCOUNTER — Ambulatory Visit (INDEPENDENT_AMBULATORY_CARE_PROVIDER_SITE_OTHER): Payer: Medicare Other | Admitting: Internal Medicine

## 2014-09-05 VITALS — BP 94/62 | HR 71 | Temp 98.4°F | Resp 14 | Ht 65.0 in | Wt 102.6 lb

## 2014-09-05 DIAGNOSIS — F411 Generalized anxiety disorder: Secondary | ICD-10-CM | POA: Diagnosis not present

## 2014-09-05 DIAGNOSIS — F329 Major depressive disorder, single episode, unspecified: Secondary | ICD-10-CM

## 2014-09-05 DIAGNOSIS — K2100 Gastro-esophageal reflux disease with esophagitis, without bleeding: Secondary | ICD-10-CM

## 2014-09-05 DIAGNOSIS — R634 Abnormal weight loss: Secondary | ICD-10-CM

## 2014-09-05 DIAGNOSIS — Z23 Encounter for immunization: Secondary | ICD-10-CM

## 2014-09-05 DIAGNOSIS — M797 Fibromyalgia: Secondary | ICD-10-CM

## 2014-09-05 DIAGNOSIS — F341 Dysthymic disorder: Secondary | ICD-10-CM

## 2014-09-05 MED ORDER — ZOSTER VACCINE LIVE 19400 UNT/0.65ML ~~LOC~~ SOLR: 0.6500 mL | Freq: Once | SUBCUTANEOUS | Status: DC

## 2014-09-05 NOTE — Progress Notes (Signed)
   Subjective:    Patient ID: Kayla [name], female    DOB: 04/26/1945, 69 y.o.   MRN: [id number]  HPI  Chief Complaint  Patient presents with  . Follow-up    Cardiology report, Nutrition,  . Medication Refill    Cetrizine, efudex cream,     Needs jury duty letter Nutr eval--see chart-still no wt gain---and sick from all the cargs and sugars--has both hypo and hypergly episodes with this eating Dr Rachael Darby wnl will use Bblocker Dr Emelda Brothers bout same stuff all the time ?immuniz--didn't get zostavax Dr Austin-09/07/13:   1.Chronic gastroesophageal reflux disease-on Nexium 2.Barrett's esophagus 3. Dysphagia-possible recurrent esophageal stricture 4. Positive family history for colon cancer 5. History of multiple adenomatous colon polyps 6. Severe chronic constipation 7. Status post sleeve gastrectomy for treatment of obesity  Dr Lonia Farber mgmt questioning her 2nd opinion  Review of Systems [place]    Objective:   Physical Exam  Constitutional: She is oriented to person, place, and time. She appears well-developed and well-nourished. No distress.  HENT:  Head: Normocephalic and atraumatic.  Eyes: Pupils are equal, round, and reactive to light.  Neck: Normal range of motion.  Cardiovascular: Normal rate and regular rhythm.   Pulmonary/Chest: Effort normal. No respiratory distress.  Musculoskeletal: Normal range of motion.  Neurological: She is alert and oriented to person, place, and time.  Skin: Skin is warm and dry.  Psychiatric: She has a normal mood and affect. Her behavior is normal.  Nursing note and vitals reviewed.  BP 94/62 mmHg  Pulse 71  Temp(Src) 98.4 F (36.9 C) (Oral)  Resp 14  Ht 5\' 5"  (1.651 m)  Wt 102 lb 9.6 oz (46.539 kg)  BMI 17.07 kg/m2  SpO2 98%        Assessment & Plan:  Need for prophylactic vaccination against Streptococcus pneumoniae (pneumococcus) - Plan: Pneumococcal conjugate vaccine 13-valent IM  WEIGHT  LOSS  Gastroesophageal reflux disease with esophagitis  Fibromyalgia  Reactive depression  Anxiety state  Meds ordered this encounter  Medications  . zoster vaccine live, PF, (ZOSTAVAX) 29562 UNT/0.65ML injection    Sig: Inject 19,400 Units into the skin once. Administer at pharmacy    Dispense:  1 each    Refill:  0   She'll call about other refills  Whole 30 Letter to Dr Liane Comber Walking program(? Add water aerobics) 36yr plan!!!! Look to the future--?stop counsel for now

## 2014-09-06 ENCOUNTER — Ambulatory Visit (INDEPENDENT_AMBULATORY_CARE_PROVIDER_SITE_OTHER): Payer: Medicare Other | Admitting: Psychiatry

## 2014-09-06 DIAGNOSIS — F332 Major depressive disorder, recurrent severe without psychotic features: Secondary | ICD-10-CM

## 2014-09-12 ENCOUNTER — Other Ambulatory Visit: Payer: Self-pay | Admitting: Internal Medicine

## 2014-09-12 DIAGNOSIS — Z1231 Encounter for screening mammogram for malignant neoplasm of breast: Secondary | ICD-10-CM

## 2014-09-13 ENCOUNTER — Ambulatory Visit: Payer: Medicare Other | Admitting: Psychiatry

## 2014-09-14 DIAGNOSIS — G894 Chronic pain syndrome: Secondary | ICD-10-CM | POA: Diagnosis not present

## 2014-09-26 ENCOUNTER — Ambulatory Visit (HOSPITAL_COMMUNITY)
Admission: RE | Admit: 2014-09-26 | Discharge: 2014-09-26 | Disposition: A | Discharge status: Home / Self Care | Payer: Medicare Other | Source: Ambulatory Visit | Attending: Internal Medicine | Admitting: Internal Medicine

## 2014-09-26 DIAGNOSIS — Z1231 Encounter for screening mammogram for malignant neoplasm of breast: Secondary | ICD-10-CM | POA: Insufficient documentation

## 2014-09-28 ENCOUNTER — Ambulatory Visit: Payer: Self-pay | Admitting: Cardiovascular Disease

## 2014-10-03 ENCOUNTER — Encounter: Payer: Self-pay | Admitting: Cardiovascular Disease

## 2014-10-03 ENCOUNTER — Ambulatory Visit (INDEPENDENT_AMBULATORY_CARE_PROVIDER_SITE_OTHER): Payer: Medicare Other | Admitting: Cardiovascular Disease

## 2014-10-03 VITALS — BP 90/66 | HR 81 | Ht 65.0 in | Wt 104.6 lb

## 2014-10-03 DIAGNOSIS — R002 Palpitations: Secondary | ICD-10-CM | POA: Diagnosis not present

## 2014-10-03 MED ORDER — PROPRANOLOL HCL 10 MG PO TABS: 10.0000 mg | ORAL_TABLET | Freq: Four times a day (QID) | ORAL | Status: DC

## 2014-10-03 NOTE — Progress Notes (Signed)
Kayla [name] Date of Birth  [birth year]       [facility] Office [address], [place], [place]  [address]   [place], [place]  [address] [phone]     [phone]   Fax  [phone]    Fax [phone]  Problem List: 1. Mitral valve prolapse-mitral regurgitation 2. Hypertension 3. Barrett's esophagus 4. Fibromyalgia 5. History chest pain-she had a normal heart catheterization in 1993. Stress Myoview study in 2008 was normal. 6. Brain aneurism -  7. S/p Roux-en-Y surgery  for Barretts esophagus  History of Present Illness: Kayla [name] continues to have chest pain.  She takes PRN propranolol which seems to help.  It typically occurs in the afternoon.  Almost always with rest.  She does not get any regular exercise.  She has had lots of bladder infections due to kidney stones recently that have not permitted her to exercise.  She's had lots of problems with anxiety.  July 15, 2014:  Kayla [name] presents for evaluation Her BP has been very low - possibly due to the various medications .  She has had left sided chest pain for years.  Has had a normal cath in the remote past and had a normal myoview  Several years ago .   She was recently seen by Dr. Newman Pies for some palpitations The palpitations would come and go .  Off and on for hours.  Was started on atenolol which has helped the palpitations. She had taken atenolol for years ( many years ago) but was off atenolol for several years.  Her palpitations have resolved after starting the atenolol .    These episodes of tachycardia are at times associated with episodes of CP . Has lost a lot of weight over the past several years. She had a Roux -in - Y gastric bypass ( supposedly to prevent esophageal cancer from Barretts esophagutis. Is not able to eat much.  Can eat 1/4 cup of food at a time .    Sept. 19, 2016:  Continues to have palpitations  - improve with  propranolol Continues to have poor appetite.  Is generally very weak Continues to see her primary medical doctor and her pain management doctor .    Current Outpatient Prescriptions on File Prior to Visit  Medication Sig Dispense Refill  . Ascorbic Acid (VITAMIN C) 1000 MG tablet Take 1,000 mg by mouth daily.    . beta carotene w/minerals (OCUVITE) tablet Take 1 tablet by mouth 2 (two) times daily.    . butalbital-acetaminophen-caffeine (FIORICET, ESGIC) 50-325-40 MG per tablet Take 1 tablet by mouth 3 (three) times daily. Headache 270 tablet 3  . CALCIUM-MAGNESIUM-ZINC PO Take by mouth daily.    . celecoxib (CELEBREX) 200 MG capsule Take 1 capsule (200 mg total) by mouth daily. 90 capsule 3  . cetirizine (ZYRTEC) 10 MG tablet Take 10 mg by mouth daily.    . Cholecalciferol (VITAMIN D3) 2000 UNITS TABS Take 2,000 Units by mouth daily.    . clonazePAM (KLONOPIN) 1 MG tablet Take 0.5-1 tablets (0.5-1 mg total) by mouth 2 (two) times daily as needed. anxiety/panic attacks 180 tablet 3  . Cyanocobalamin (VITAMIN B-12) 1000 MCG SUBL Place 1 drop under the tongue daily.    . cyclobenzaprine (FLEXERIL) 10 MG tablet Take 1 tablet (10 mg total) by mouth 3 (three) times daily as needed for muscle spasms. 21 tablet 0  . diclofenac sodium (  VOLTAREN) 1 % GEL Apply 2 g topically 4 (four) times daily. 100 g 1  . dicyclomine (BENTYL) 10 MG capsule Take 1-2 tab 3 times daily as needed for spasms and cramping. 270 capsule 3  . diltiazem 2 % GEL Apply 1 application topically 2 (two) times daily. 30 g 0  . docusate sodium (COLACE) 250 MG capsule Take 250 mg by mouth daily.    . DULoxetine (CYMBALTA) 30 MG capsule Take 1 capsule (30 mg total) by mouth daily. 90 capsule 3  . esomeprazole (NEXIUM) 40 MG capsule Take 1 capsule (40 mg total) by mouth daily before breakfast. 90 capsule 3  . fentaNYL (DURAGESIC - DOSED MCG/HR) 75 MCG/HR Place 1 patch onto the skin every 3 (three) days.    . Flaxseed, Linseed, (FLAX  SEED OIL) 1000 MG CAPS Take 1,000 mg by mouth daily.    . fluorouracil (EFUDEX) 5 % cream Apply 1 application topically 2 (two) times daily.    Kayla [name] gabapentin (NEURONTIN) 300 MG capsule Take 1 capsule (300 mg total) by mouth 3 (three) times daily. 270 capsule 3  . HYDROcodone-acetaminophen (NORCO) 7.5-325 MG per tablet Take 1 tablet by mouth every 6 (six) hours as needed. Pain     . hypromellose (SYSTANE OVERNIGHT THERAPY) 0.3 % GEL ophthalmic ointment Place 1 drop into both eyes at bedtime.    Kayla [name] ipratropium (ATROVENT) 0.06 % nasal spray Place 2 sprays into both nostrils 2 (two) times daily. 15 mL 12  . ketoconazole (NIZORAL) 2 % cream Apply 1 application topically daily. Under breasts 15 g 0  . lidocaine (LIDODERM) 5 % Place 1 patch onto the skin as needed. Remove & Discard patch within 12 hours. May dispense as 3 month supply 90 patch 3  . Linaclotide (LINZESS) 145 MCG CAPS capsule Take 1 capsule (145 mcg total) by mouth daily. 90 capsule 3  . Loteprednol Etabonate (LOTEMAX) 0.5 % GEL Place 1 drop into both eyes 2 (two) times daily.    . metoprolol tartrate (LOPRESSOR) 25 MG tablet Take 1 tablet (25 mg total) by mouth 2 (two) times daily. 60 tablet 1  . Omega-3 Fatty Acids (SEA-OMEGA PO) Take 1,000 mg by mouth daily.    Kayla [name] omeprazole (PRILOSEC OTC) 20 MG tablet Take 20 mg by mouth daily.    . polyethylene glycol powder (GLYCOLAX/MIRALAX) powder Take 17 g by mouth 2 (two) times daily.    . propranolol (INDERAL) 10 MG tablet Take 1 tablet (10 mg total) by mouth 4 (four) times daily. 60 tablet 1  . pyridoxine (B-6) 200 MG tablet Take 200 mg by mouth daily.    . solifenacin (VESICARE) 5 MG tablet Take 1 tablet (5 mg total) by mouth daily. 90 tablet 3  . sucralfate (CARAFATE) 1 GM/10ML suspension Take 10 mLs (1 g total) by mouth 4 (four) times daily -  with meals and at bedtime. 420 mL 3  . traZODone (DESYREL) 150 MG tablet Take 1 tablet (150 mg total) by mouth at bedtime. And 1/2 -1 if wakes at 4am.  180 tablet 3  . triamcinolone (NASACORT) 55 MCG/ACT AERO nasal inhaler Place 2 sprays into the nose daily.    Kayla [name] Root ([facility] VAL[facility]IAN) 450 MG CAPS Take 1 capsule by mouth daily.    Kayla [name] zoster vaccine live, PF, (ZOSTAVAX) 36644 UNT/0.65ML injection Inject 19,400 Units into the skin once. Administer at pharmacy 1 each 0  . [DISCONTINUED] buPROPion (WELLBUTRIN) 75 MG tablet Take 75 mg by mouth daily after breakfast.  No current facility-administered medications on file prior to visit.    Allergies  Allergen Reactions  . Codeine Anaphylaxis  . Oxycodone-Acetaminophen Shortness Of Breath  . Tylox [Oxycodone-Acetaminophen] Anaphylaxis    Chest pain  . Darvocet [Propoxyphene N-Acetaminophen]     Chest pain  . Darvon [Propoxyphene] Itching  . Food Swelling    Peanuts-wheat-  . Lorcet [Hydrocodone-Acetaminophen]     Chest pain  . Opium     hallucinations  . Wheat Bran   . Zolpidem Tartrate Other (See Comments)    Makes her go crazy  . Zolpidem Tartrate     hallucinations  . Amoxicillin Nausea And Vomiting    Reaction:  Migraine headache  . Penicillins Nausea And Vomiting    Migraine Headaches    Past Medical History  Diagnosis Date  . Barrett esophagus     not consistently present  . History of kidney stones   . Anxiety   . IBS (irritable bowel syndrome)   . Complication of anesthesia     either  BP or pulse dropped with last surgery  . Angina     takes Propanolol prn and it stops her chest  . Chronic kidney disease     kidney stones and UTI  . Depression     post traumatic stress  disorder  . Hypertension     3 small brain aneurysms  . GERD (gastroesophageal reflux disease)   . DDD (degenerative disc disease)     cervical and lumbar  . Osteoarthritis     all over  . Headache(784.0)     migraines  . Dysrhythmia     brought on by stress  . Peripheral vascular disease     superficial phlebitis in left calf  . Diabetes mellitus     sugar goes up and down  diet controlled  . Anemia     anemia in the past  . Fibromyalgia     neuropathy knees ankles and toes, bladder  . Hiatal hernia   . H/O hyperparathyroidism   . Osteopetrosis   . Allergy   . Osteoporosis   . Dementia   . Macular degeneration   . Fatigue   . Headache   . Migraines   . Personal history of colonic polyps 07/22/2013    07/2013 - 3 small adenomas - repeat colonoscopy 2018    Past Surgical History  Procedure Laterality Date  . US echocardiography  01/21/2007    EF 55-60%  . US echocardiography  08/30/2003    EF 55-60%  . Cardiovascular stress test  10/03/2006  . Esophagus surgery    . Gastric bypass  1999  . Appendectomy    . Cholecystectomy    . Abdominal hysterectomy    . Gastric restriction surgery  1991  . Tubal ligation    . Right knee arthroscopy    . Right arm surgery    . Upper gastrointestinal endoscopy  06/23/2008    w/Dil, Barrett's esophagus  . Colonoscopy  06/23/2008    normal  . Tonsillectomy    . Cardiac catheterization  03/22/1991    EF 61%  . Stomach stappeling  1991  . Dilation and curettage of uterus      x2  . Exploratory laparotomy  1993  . Stone extraction with basket  2012  . Rt wrist fx  2009    History  Smoking status  . Never Smoker   Smokeless tobacco  . Never Used    History  Alcohol Use  No    Family History  Problem Relation Age of Onset  . Stroke Mother   . Lung cancer Mother   . Heart disease Mother   . Diabetes Mother   . Hypertension Father   . Stroke Father   . Lung cancer Father   . Heart disease Father   . Kidney disease Father   . Seizures Father     epilepsy, and sisters x 2  . Pancreatic cancer Sister   . Lung cancer Sister   . Colon cancer Maternal Aunt     12 relatives  . Uterine cancer      aunt  . Heart disease      grandmother  . Clotting disorder Sister   . Heart disease Sister     x 2  . Kidney disease Sister     x 2  . Breast cancer Sister   . Colon cancer Paternal Aunt   . Colon  cancer Paternal Aunt     Reviw of Systems:  Reviewed in the HPI.  All other systems are negative.  Physical Exam: Blood pressure 90/66, pulse 81, height 5\' 5"  (1.651 m), weight 47.446 kg (104 lb 9.6 oz). General: Well developed, well nourished, in no acute distress.  Head: Normocephalic, atraumatic, sclera non-icteric, mucus membranes are moist,   Neck: Supple. Carotids are 2 + without bruits. No JVD  Lungs: Clear bilaterally to auscultation.  Heart: regular rate.  normal  S1 S2. She has a very soft systolic murmur.   Abdomen: Soft, non-tender, non-distended with normal bowel sounds. No hepatomegaly. No rebound/guarding. No masses.  Msk:  Strength and tone are normal  Extremities: No clubbing or cyanosis. No edema.  Distal pedal pulses are 2+ and equal bilaterally.  Neuro: Alert and oriented X 3. Moves all extremities spontaneously.  Psych:  Responds to questions appropriately with a normal affect.  ECG: 07/08/14:   NSR , no ST or T Wave changes.   Assessment / Plan:   1.  Chest pain: The patient is a episodes of chest pain that are associated with her palpitations.  She had an echocardiogram that reveals: Left ventricle: The cavity size was normal. Wall thickness was normal. Systolic function was normal. The estimated ejection fraction was in the range of 55% to 60%. Wall motion was normal; there were no regional wall motion abnormalities. Left ventricular diastolic function parameters were normal. - Mitral valve: Mildly thickened leaflets . There was trivial regurgitation. - Left atrium: The atrium was normal in size  Her symptoms do not sound like angina.  These episodes of pain seem to occur when she has palpitations. I think that her primary issues severe malnutrition and volume depletion.  2. Hypotension: I suspect that she is volume  depleted. She admits that her dietary intake is fairly poor.  3. Palpitations:  Improved with propranolol. Continue to  use PRN.     Nahser, Wonda Cheng, MD  10/03/2014 12:12 PM    [address],  [place], [place]  [address] Pager [phone] Phone: [phone]; Fax: [phone]   [facility]  [address] [place], [place]  [address] [phone]    Fax [phone]

## 2014-10-03 NOTE — Patient Instructions (Signed)
Medication Instructions:  Your physician recommends that you continue on your current medications as directed. Please refer to the Current Medication list given to you today.   Labwork: None Ordered   Testing/Procedures: None Ordered   Follow-Up: Your physician wants you to follow-up in: 1 year with Dr. Nahser.  You will receive a reminder letter in the mail two months in advance. If you don't receive a letter, please call our office to schedule the follow-up appointment.      

## 2014-10-04 ENCOUNTER — Ambulatory Visit: Payer: Medicare Other | Admitting: Psychiatry

## 2014-10-11 ENCOUNTER — Ambulatory Visit: Payer: Medicare Other | Admitting: Psychiatry

## 2014-10-14 ENCOUNTER — Ambulatory Visit: Payer: Self-pay | Admitting: Cardiovascular Disease

## 2014-10-14 ENCOUNTER — Encounter: Payer: Medicare Other | Admitting: Physical Medicine & Rehabilitation

## 2014-10-25 ENCOUNTER — Ambulatory Visit (INDEPENDENT_AMBULATORY_CARE_PROVIDER_SITE_OTHER): Payer: Medicare Other | Admitting: Psychiatry

## 2014-10-25 DIAGNOSIS — F332 Major depressive disorder, recurrent severe without psychotic features: Secondary | ICD-10-CM

## 2014-11-01 ENCOUNTER — Telehealth: Payer: Self-pay

## 2014-11-01 DIAGNOSIS — F411 Generalized anxiety disorder: Secondary | ICD-10-CM

## 2014-11-01 DIAGNOSIS — G43C0 Periodic headache syndromes in child or adult, not intractable: Secondary | ICD-10-CM

## 2014-11-01 NOTE — Telephone Encounter (Signed)
[facility] - Patient needs refills on :  NASAL INHALER, DICLOFENAC, SUCRAFATE, [place], IMITREX

## 2014-11-02 NOTE — Telephone Encounter (Signed)
Advise on Clonazepam.

## 2014-11-04 MED ORDER — TRIAMCINOLONE ACETONIDE 55 MCG/ACT NA AERO: 2.0000 | INHALATION_SPRAY | Freq: Every day | NASAL | Status: DC

## 2014-11-04 MED ORDER — SUMATRIPTAN SUCCINATE 100 MG PO TABS: 100.0000 mg | ORAL_TABLET | ORAL | Status: DC | PRN

## 2014-11-04 MED ORDER — CLONAZEPAM 1 MG PO TABS: 0.5000 mg | ORAL_TABLET | Freq: Two times a day (BID) | ORAL | Status: DC | PRN

## 2014-11-04 MED ORDER — DICLOFENAC SODIUM 1 % TD GEL: 2.0000 g | Freq: Four times a day (QID) | TRANSDERMAL | Status: DC

## 2014-11-04 MED ORDER — SUCRALFATE 1 GM/10ML PO SUSP: 1.0000 g | Freq: Three times a day (TID) | ORAL | Status: DC

## 2014-11-04 NOTE — Telephone Encounter (Signed)
Meds ordered this encounter  Medications  . SUMAtriptan (IMITREX) 100 MG tablet    Sig: Take 1 tablet (100 mg total) by mouth every 3 (three) days as needed for migraine. Headache    Dispense:  10 tablet    Refill:  5  . clonazePAM (KLONOPIN) 1 MG tablet    Sig: Take 0.5-1 tablets (0.5-1 mg total) by mouth 2 (two) times daily as needed. anxiety/panic attacks    Dispense:  180 tablet    Refill:  3  . triamcinolone (NASACORT) 55 MCG/ACT AERO nasal inhaler    Sig: Place 2 sprays into the nose daily.    Dispense:  1 Inhaler    Refill:  10  . sucralfate (CARAFATE) 1 GM/10ML suspension    Sig: Take 10 mLs (1 g total) by mouth 4 (four) times daily -  with meals and at bedtime.    Dispense:  420 mL    Refill:  3

## 2014-11-04 NOTE — Telephone Encounter (Signed)
The gel

## 2014-11-10 ENCOUNTER — Ambulatory Visit (INDEPENDENT_AMBULATORY_CARE_PROVIDER_SITE_OTHER): Payer: Medicare Other | Admitting: Psychiatry

## 2014-11-10 DIAGNOSIS — F332 Major depressive disorder, recurrent severe without psychotic features: Secondary | ICD-10-CM | POA: Diagnosis not present

## 2014-11-17 DIAGNOSIS — M797 Fibromyalgia: Secondary | ICD-10-CM | POA: Diagnosis not present

## 2014-11-17 DIAGNOSIS — M542 Cervicalgia: Secondary | ICD-10-CM | POA: Diagnosis not present

## 2014-11-20 ENCOUNTER — Other Ambulatory Visit: Payer: Self-pay | Admitting: Physician Assistant

## 2014-11-20 ENCOUNTER — Other Ambulatory Visit: Payer: Self-pay | Admitting: Cardiovascular Disease

## 2014-11-22 ENCOUNTER — Ambulatory Visit: Payer: Medicare Other | Admitting: Psychiatry

## 2014-11-23 ENCOUNTER — Ambulatory Visit (INDEPENDENT_AMBULATORY_CARE_PROVIDER_SITE_OTHER): Payer: Medicare Other | Admitting: Internal Medicine

## 2014-11-23 ENCOUNTER — Encounter: Payer: Self-pay | Admitting: Internal Medicine

## 2014-11-23 VITALS — BP 97/60 | HR 80 | Temp 98.3°F | Resp 16 | Ht 65.0 in | Wt 105.0 lb

## 2014-11-23 DIAGNOSIS — Z23 Encounter for immunization: Secondary | ICD-10-CM | POA: Diagnosis not present

## 2014-11-23 DIAGNOSIS — F411 Generalized anxiety disorder: Secondary | ICD-10-CM

## 2014-11-23 DIAGNOSIS — K3189 Other diseases of stomach and duodenum: Secondary | ICD-10-CM

## 2014-11-23 DIAGNOSIS — G43C0 Periodic headache syndromes in child or adult, not intractable: Secondary | ICD-10-CM

## 2014-11-23 DIAGNOSIS — K319 Disease of stomach and duodenum, unspecified: Secondary | ICD-10-CM | POA: Diagnosis not present

## 2014-11-23 DIAGNOSIS — R350 Frequency of micturition: Secondary | ICD-10-CM | POA: Diagnosis not present

## 2014-11-23 LAB — POC MICROSCOPIC URINALYSIS (UMFC): Mucus: ABSENT

## 2014-11-23 LAB — POCT URINALYSIS DIP (MANUAL ENTRY)
BILIRUBIN UA: NEGATIVE
Blood, UA: NEGATIVE
Glucose, UA: NEGATIVE
Ketones, POC UA: NEGATIVE
LEUKOCYTES UA: NEGATIVE
NITRITE UA: NEGATIVE
PH UA: 7
Protein Ur, POC: NEGATIVE
Spec Grav, UA: 1.015
Urobilinogen, UA: 2

## 2014-11-23 MED ORDER — SUMATRIPTAN SUCCINATE 100 MG PO TABS: 100.0000 mg | ORAL_TABLET | ORAL | Status: DC | PRN

## 2014-11-23 MED ORDER — CLONAZEPAM 1 MG PO TABS: 0.5000 mg | ORAL_TABLET | Freq: Two times a day (BID) | ORAL | Status: DC | PRN

## 2014-11-23 MED ORDER — DICLOFENAC SODIUM 1 % TD GEL: 2.0000 g | Freq: Four times a day (QID) | TRANSDERMAL | Status: DC

## 2014-11-23 MED ORDER — TRIAMCINOLONE ACETONIDE 55 MCG/ACT NA AERO: 2.0000 | INHALATION_SPRAY | Freq: Every day | NASAL | Status: DC

## 2014-11-23 MED ORDER — DICYCLOMINE HCL 10 MG PO CAPS: ORAL_CAPSULE | ORAL | Status: DC

## 2014-11-23 NOTE — Patient Instructions (Signed)
Patient Active Problem List   Diagnosis Date Noted  . H/O gastric bypass 02/11/2014  . Chronic maxillary sinusitis 09/15/2013  . Personal history of colonic polyps 07/22/2013  . HA (headache)-chronic/tension type 07/28/2012  . Osteoporosis, unspecified 07/28/2012  . Unspecified vitamin D deficiency-2009 07/28/2012  . Nephrolithiasis 07/28/2012  . Fibromyalgia 07/26/2012  . Chronic interstitial cystitis 10/16/2011  . Chronic constipation 10/04/2010  . WEIGHT LOSS 11/27/2009  . PERSONAL HISTORY OF FAILED MODERATE SEDATION 06/01/2008  . Anxiety state 03/18/2007  . Reactive depression 03/18/2007  . GERD 03/18/2007  . Diaphragmatic hernia 03/18/2007  . OSTEOARTHRITIS 03/18/2007  . [place] DISEASE, CERVICAL 03/18/2007  . [place] DISEASE, LUMBAR 03/18/2007  . Myalgia and myositis 03/18/2007

## 2014-11-25 NOTE — Progress Notes (Signed)
Fu Chief Complaint  Patient presents with  . Urinary Frequency--occas stress incont/hx IC  . Medication Refill  . Hot Flashes--daily and nightly/hyst 30y ago//gaining weight not losing finally No fever/nl tsh recent  . Referral    Colonoscopy--Dr Carlean Purl ///Last in [place]   Patient Active Problem List   Diagnosis Date Noted  . H/O gastric bypass 02/11/2014  . Personal history of colonic polyps 07/22/2013  . HA (headache)-chronic/tension type---still helpful Fioricet 07/28/2012  . Osteoporosis, unspecified 07/28/2012  . Unspecified vitamin D deficiency-2009 07/28/2012  . Nephrolithiasis 07/28/2012  . Fibromyalgia 07/26/2012  . Chronic interstitial cystitis 10/16/2011  . Chronic constipation 10/04/2010  . Anxiety state---better/family more stable 03/18/2007  . Reactive depression 03/18/2007  . GERD and IBS sxt helped with bentyl 03/18/2007  . Diaphragmatic hernia 03/18/2007  . OSTEOARTHRITIS 03/18/2007  . [place] DISEASE, CERVICAL 03/18/2007  . DISC DISEASE, LUMBAR 03/18/2007   Chr pain management  dr Derwood Kaplan  EXAM BP 97/60 mmHg  Pulse 80  Temp(Src) 98.3 F (36.8 C)  Resp 16  Ht 5\' 5"  (1.651 m)  Wt 105 lb (47.628 kg)  BMI 17.47 kg/m2 HEENT clear Ht Reg Mood good today  Results for orders placed or performed in visit on 11/23/14  POCT urinalysis dipstick  Result Value Ref Range   Color, UA other (A) yellow   Clarity, UA hazy (A) clear   Glucose, UA negative negative   Bilirubin, UA negative negative   Ketones, POC UA negative negative   Spec Grav, UA 1.015    Blood, UA negative negative   pH, UA 7.0    Protein Ur, POC negative negative   Urobilinogen, UA 2.0    Nitrite, UA Negative Negative   Leukocytes, UA Negative Negative  POCT Microscopic Urinalysis ([facility])  Result Value Ref Range   WBC,UR,HPF,POC None None WBC/hpf   RBC,UR,HPF,POC None None RBC/hpf   Bacteria None None, Too numerous to count   Mucus Absent Absent   Epithelial Cells, UR Per Microscopy  None None, Too numerous to count cells/hpf   IMP Need for prophylactic vaccination and inoculation against influenza - Plan: Flu Vaccine QUAD 36+ mos IM  Frequency of urination - Plan: POCT urinalysis dipstick, POCT Microscopic Urinalysis ([facility]), CANCELED: POCT Microscopic Urinalysis ([facility]), CANCELED: POCT urinalysis dipstick  Spasm of GI tract - Plan: dicyclomine (BENTYL) 10 MG capsule  Periodic headache syndrome, not intractable - Plan: SUMAtriptan (IMITREX) 100 MG tablet  Anxiety state - Plan: clonazePAM (KLONOPIN) 1 MG tablet  Meds ordered this encounter  Medications  . fluticasone (FLONASE) 50 MCG/ACT nasal spray    Sig: Place into both nostrils daily.  Marland Kitchen dicyclomine (BENTYL) 10 MG capsule    Sig: Take 1-2 tab 3 times daily AC as needed for spasms and cramping.    Dispense:  270 capsule    Refill:  3  . SUMAtriptan (IMITREX) 100 MG tablet    Sig: Take 1 tablet (100 mg total) by mouth every 3 (three) days as needed for migraine. Headache    Dispense:  10 tablet    Refill:  5  . clonazePAM (KLONOPIN) 1 MG tablet    Sig: Take 0.5-1 tablets (0.5-1 mg total) by mouth 2 (two) times daily as needed. anxiety/panic attacks    Dispense:  180 tablet    Refill:  3  . diclofenac sodium (VOLTAREN) 1 % GEL    Sig: Apply 2 g topically 4 (four) times daily.    Dispense:  100 g    Refill:  1  .  triamcinolone (NASACORT) 55 MCG/ACT AERO nasal inhaler    Sig: Place 2 sprays into the nose daily.    Dispense:  1 Inhaler    Refill:  10   ?urology next

## 2014-12-01 ENCOUNTER — Telehealth: Payer: Self-pay

## 2014-12-01 DIAGNOSIS — G44009 Cluster headache syndrome, unspecified, not intractable: Secondary | ICD-10-CM

## 2014-12-01 NOTE — Telephone Encounter (Signed)
Marcellus needs a refill on medication sent  in to [facility]. butalbital-acetaminophen-caffeine (FIORICET, ESGIC) 50-325-40 MG per tablet [[id number]] and the diclofenac sodium (VOLTAREN) 1 % GEL [id number]

## 2014-12-06 ENCOUNTER — Ambulatory Visit: Payer: Medicare Other | Admitting: Psychiatry

## 2014-12-09 MED ORDER — BUTALBITAL-APAP-CAFFEINE 50-325-40 MG PO TABS: 1.0000 | ORAL_TABLET | Freq: Three times a day (TID) | ORAL | Status: DC

## 2014-12-09 NOTE — Telephone Encounter (Signed)
Gel done 11/6??? Sent fioicet Meds ordered this encounter  Medications  . butalbital-acetaminophen-caffeine (FIORICET, ESGIC) 50-325-40 MG tablet    Sig: Take 1 tablet by mouth 3 (three) times daily. Headache    Dispense:  270 tablet    Refill:  3

## 2014-12-10 NOTE — Telephone Encounter (Signed)
Faxed

## 2014-12-12 ENCOUNTER — Encounter: Payer: Self-pay | Admitting: Internal Medicine

## 2014-12-14 DIAGNOSIS — H01001 Unspecified blepharitis right upper eyelid: Secondary | ICD-10-CM | POA: Diagnosis not present

## 2014-12-14 DIAGNOSIS — H04123 Dry eye syndrome of bilateral lacrimal glands: Secondary | ICD-10-CM | POA: Diagnosis not present

## 2014-12-14 DIAGNOSIS — H01004 Unspecified blepharitis left upper eyelid: Secondary | ICD-10-CM | POA: Diagnosis not present

## 2014-12-14 DIAGNOSIS — H01002 Unspecified blepharitis right lower eyelid: Secondary | ICD-10-CM | POA: Diagnosis not present

## 2014-12-14 DIAGNOSIS — H01005 Unspecified blepharitis left lower eyelid: Secondary | ICD-10-CM | POA: Diagnosis not present

## 2014-12-16 ENCOUNTER — Other Ambulatory Visit: Payer: Self-pay | Admitting: Physician Assistant

## 2014-12-16 DIAGNOSIS — K602 Anal fissure, unspecified: Secondary | ICD-10-CM

## 2014-12-19 NOTE — Telephone Encounter (Signed)
Dr [name], [facility] wrote this for pt 02/11/14 for anal fissure w/out RFs. You saw pt for check up recently but don't see this discussed. OK to RF?

## 2014-12-20 ENCOUNTER — Ambulatory Visit: Payer: Medicare Other | Admitting: Psychiatry

## 2015-01-16 ENCOUNTER — Other Ambulatory Visit: Payer: Self-pay

## 2015-01-16 DIAGNOSIS — G44009 Cluster headache syndrome, unspecified, not intractable: Secondary | ICD-10-CM

## 2015-01-16 MED ORDER — CYCLOBENZAPRINE HCL 10 MG PO TABS: 10.0000 mg | ORAL_TABLET | Freq: Three times a day (TID) | ORAL | Status: DC | PRN

## 2015-01-16 NOTE — Telephone Encounter (Signed)
Pt needs refills sent to [facility] for her Cyclobenazprine 10 mg and her Acetaminophen 325(she wants an increase of Acetaminophen up to 6 daily)   Best phone for pt is [phone]

## 2015-01-16 NOTE — Telephone Encounter (Signed)
Meds ordered this encounter  Medications  . cyclobenzaprine (FLEXERIL) 10 MG tablet    Sig: Take 1 tablet (10 mg total) by mouth 3 (three) times daily as needed for muscle spasms.    Dispense:  90 tablet    Refill:  1   ??acetomenophen??

## 2015-01-17 NOTE — Telephone Encounter (Signed)
Called pt who clarified that she is asking for the generic Fioricet which has the tylenol in it. She stated that she read the info w/ the med and it says that it can be taken up to 6 tabs daily and not cause injury to the liver. Reported that sometimes when she gets her HAs, she would like to be able to use more than the 3 currently Rxd. Pt realizes that Vicodin also has tylenol in it, and she could either not take the Vicodin when she is needing the Fioricet, or is willing to drop the Vicodin altogether if Dr [name] is more comfortable with that. Please advise. Pended current Rx.

## 2015-01-17 NOTE — Telephone Encounter (Signed)
Ok to take up to 6 a day but not everyday--would need another med instead if having frequent hAs requiring daily fioricet 6 a day She could use up to 15 a week and still be safe Would that be enough for her or do we need to find an additional way to treat HAs??

## 2015-01-18 MED ORDER — BUTALBITAL-APAP-CAFFEINE 50-325-40 MG PO TABS: 1.0000 | ORAL_TABLET | Freq: Four times a day (QID) | ORAL | Status: DC | PRN

## 2015-01-18 NOTE — Telephone Encounter (Signed)
Faxed flexeril to [facility] because pt wants the fioricet sent locally anyway, so will not wait for that. Dr. [name], pls see below.

## 2015-01-18 NOTE — Telephone Encounter (Signed)
Pt stated that #15 a week will not be enough to treat her HAs. Reported that she wakes up during the night and in the mornings with HAs, and the fioricet will give her a couple of hours reprieve, but that's it. Pt wonders if she needs another MRI? I advised she will need to discuss new treatment possibilities with Dr [name] and she agreed and plans to come in this weekend. Pt requested a regular RF of fioricet to be sent to [place] to use until OV and I have pended it. Not sure whether you want to give her 90 days as usual, or just 30 days if you plan to change therapy?

## 2015-01-18 NOTE — Telephone Encounter (Signed)
[place] Call in Meds ordered this encounter  Medications  . butalbital-acetaminophen-caffeine (FIORICET, ESGIC) 50-325-40 MG tablet    Sig: Take 1 tablet by mouth every 6 (six) hours as needed for headache. Headache    Dispense:  120 tablet    Refill:  0  And I'll see her this weekend

## 2015-01-19 DIAGNOSIS — H524 Presbyopia: Secondary | ICD-10-CM | POA: Diagnosis not present

## 2015-01-19 DIAGNOSIS — H26491 Other secondary cataract, right eye: Secondary | ICD-10-CM | POA: Diagnosis not present

## 2015-01-19 DIAGNOSIS — H59033 Cystoid macular edema following cataract surgery, bilateral: Secondary | ICD-10-CM | POA: Diagnosis not present

## 2015-01-20 NOTE — Telephone Encounter (Signed)
Called in and notified pt. Pt asked me to move RF to [facility] because it is snowing and [facility] now. Done.

## 2015-01-26 DIAGNOSIS — H26491 Other secondary cataract, right eye: Secondary | ICD-10-CM | POA: Diagnosis not present

## 2015-02-09 ENCOUNTER — Telehealth: Payer: Self-pay | Admitting: Family Medicine

## 2015-02-09 NOTE — Telephone Encounter (Signed)
Left a message for patient to return call.  Need to schedule patient an annual exam

## 2015-02-09 NOTE — Telephone Encounter (Signed)
Patient returned phone call and she will be coming in on March 08, 2015 at 11:45 to have a CPE with Dr. [name].

## 2015-02-11 ENCOUNTER — Ambulatory Visit (INDEPENDENT_AMBULATORY_CARE_PROVIDER_SITE_OTHER): Payer: Medicare Other | Admitting: Urgent Care

## 2015-02-11 VITALS — BP 110/68 | HR 72 | Temp 98.7°F | Resp 20 | Ht 66.0 in | Wt 107.6 lb

## 2015-02-11 DIAGNOSIS — G894 Chronic pain syndrome: Secondary | ICD-10-CM

## 2015-02-11 DIAGNOSIS — Z86010 Personal history of colonic polyps: Secondary | ICD-10-CM | POA: Diagnosis not present

## 2015-02-11 DIAGNOSIS — G8929 Other chronic pain: Secondary | ICD-10-CM

## 2015-02-11 DIAGNOSIS — N3090 Cystitis, unspecified without hematuria: Secondary | ICD-10-CM

## 2015-02-11 DIAGNOSIS — R350 Frequency of micturition: Secondary | ICD-10-CM

## 2015-02-11 DIAGNOSIS — R519 Headache, unspecified: Secondary | ICD-10-CM

## 2015-02-11 DIAGNOSIS — R51 Headache: Secondary | ICD-10-CM | POA: Diagnosis not present

## 2015-02-11 DIAGNOSIS — M5030 Other cervical disc degeneration, unspecified cervical region: Secondary | ICD-10-CM | POA: Diagnosis not present

## 2015-02-11 LAB — POCT URINALYSIS DIP (MANUAL ENTRY)
Blood, UA: NEGATIVE
Glucose, UA: NEGATIVE
NITRITE UA: NEGATIVE
PH UA: 5
Spec Grav, UA: 1.02
Urobilinogen, UA: 1

## 2015-02-11 LAB — POC MICROSCOPIC URINALYSIS (UMFC)

## 2015-02-11 MED ORDER — AMOXICILLIN-POT CLAVULANATE 875-125 MG PO TABS: 1.0000 | ORAL_TABLET | Freq: Two times a day (BID) | ORAL | Status: DC

## 2015-02-11 MED ORDER — BUTALBITAL-APAP-CAFFEINE 50-325-40 MG PO TABS: 1.0000 | ORAL_TABLET | Freq: Four times a day (QID) | ORAL | Status: DC | PRN

## 2015-02-11 NOTE — Patient Instructions (Addendum)
Urinary Tract Infection Urinary tract infections (UTIs) can develop anywhere along your urinary tract. Your urinary tract is your body's drainage system for removing wastes and extra water. Your urinary tract includes two kidneys, two ureters, a bladder, and a urethra. Your kidneys are a pair of bean-shaped organs. Each kidney is about the size of your fist. They are located below your ribs, one on each side of your spine. CAUSES Infections are caused by microbes, which are microscopic organisms, including fungi, viruses, and bacteria. These organisms are so small that they can only be seen through a microscope. Bacteria are the microbes that most commonly cause UTIs. SYMPTOMS  Symptoms of UTIs may vary by age and gender of the patient and by the location of the infection. Symptoms in young women typically include a frequent and intense urge to urinate and a painful, burning feeling in the bladder or urethra during urination. Older women and men are more likely to be tired, shaky, and weak and have muscle aches and abdominal pain. A fever may mean the infection is in your kidneys. Other symptoms of a kidney infection include pain in your back or sides below the ribs, nausea, and vomiting. DIAGNOSIS To diagnose a UTI, your caregiver will ask you about your symptoms. Your caregiver will also ask you to provide a urine sample. The urine sample will be tested for bacteria and white blood cells. White blood cells are made by your body to help fight infection. TREATMENT  Typically, UTIs can be treated with medication. Because most UTIs are caused by a bacterial infection, they usually can be treated with the use of antibiotics. The choice of antibiotic and length of treatment depend on your symptoms and the type of bacteria causing your infection. HOME CARE INSTRUCTIONS  If you were prescribed antibiotics, take them exactly as your caregiver instructs you. Finish the medication even if you feel better after  you have only taken some of the medication.  Drink enough water and fluids to keep your urine clear or pale yellow.  Avoid caffeine, tea, and carbonated beverages. They tend to irritate your bladder.  Empty your bladder often. Avoid holding urine for long periods of time.  Empty your bladder before and after sexual intercourse.  After a bowel movement, women should cleanse from front to back. Use each tissue only once. SEEK MEDICAL CARE IF:   You have back pain.  You develop a fever.  Your symptoms do not begin to resolve within 3 days. SEEK IMMEDIATE MEDICAL CARE IF:   You have severe back pain or lower abdominal pain.  You develop chills.  You have nausea or vomiting.  You have continued burning or discomfort with urination. MAKE SURE YOU:   Understand these instructions.  Will watch your condition.  Will get help right away if you are not doing well or get worse.   This information is not intended to replace advice given to you by your health care provider. Make sure you discuss any questions you have with your health care provider.   Document Released: 10/10/2004 Document Revised: 09/21/2014 Document Reviewed: 02/08/2011 Elsevier Interactive Patient Education 2016 Elsevier Inc.   Acetaminophen; Butalbital; Caffeine tablets or capsules What is this medicine? ACETAMINOPHEN; BUTALBITAL; CAFFEINE (a set a MEE noe fen; byoo TAL bi tal; KAF een) is a pain reliever. It is used to treat tension headaches. This medicine may be used for other purposes; ask your health care provider or pharmacist if you have questions. What should I tell  my health care provider before I take this medicine? They need to know if you have any of these conditions: -drug abuse or addiction -heart or circulation problems -if you often drink alcohol -kidney disease or problems going to the bathroom -liver disease -lung disease, asthma, or breathing problems -porphyria -an unusual or allergic  reaction to acetaminophen, butalbital or other barbiturates, caffeine, other medicines, foods, dyes, or preservatives -pregnant or trying to get pregnant -breast-feeding How should I use this medicine? Take this medicine by mouth with a full glass of water. Follow the directions on the prescription label. If the medicine upsets your stomach, take the medicine with food or milk. Do not take more than you are told to take. Talk to your pediatrician regarding the use of this medicine in children. Special care may be needed. Overdosage: If you think you have taken too much of this medicine contact a poison control center or emergency room at once. NOTE: This medicine is only for you. Do not share this medicine with others. What if I miss a dose? If you miss a dose, take it as soon as you can. If it is almost time for your next dose, take only that dose. Do not take double or extra doses. What may interact with this medicine? -alcohol or medicines that contain alcohol -antidepressants, especially MAOIs like isocarboxazid, phenelzine, tranylcypromine, and selegiline -antihistamines -benzodiazepines -carbamazepine -isoniazid -medicines for pain like pentazocine, buprenorphine, butorphanol, nalbuphine, tramadol, and propoxyphene -muscle relaxants -naltrexone -phenobarbital, phenytoin, and fosphenytoin -phenothiazines like perphenazine, thioridazine, chlorpromazine, mesoridazine, fluphenazine, prochlorperazine, promazine, and trifluoperazine -voriconazole This list may not describe all possible interactions. Give your health care provider a list of all the medicines, herbs, non-prescription drugs, or dietary supplements you use. Also tell them if you smoke, drink alcohol, or use illegal drugs. Some items may interact with your medicine. What should I watch for while using this medicine? Tell your doctor or health care professional if your pain does not go away, if it gets worse, or if you have new or  a different type of pain. You may develop tolerance to the medicine. Tolerance means that you will need a higher dose of the medicine for pain relief. Tolerance is normal and is expected if you take the medicine for a long time. Do not suddenly stop taking your medicine because you may develop a severe reaction. Your body becomes used to the medicine. This does NOT mean you are addicted. Addiction is a behavior related to getting and using a drug for a non-medical reason. If you have pain, you have a medical reason to take pain medicine. Your doctor will tell you how much medicine to take. If your doctor wants you to stop the medicine, the dose will be slowly lowered over time to avoid any side effects. You may get drowsy or dizzy when you first start taking the medicine or change doses. Do not drive, use machinery, or do anything that may be dangerous until you know how the medicine affects you. Stand or sit up slowly. Do not take other medicines that contain acetaminophen with this medicine. Always read labels carefully. If you have questions, ask your doctor or pharmacist. If you take too much acetaminophen get medical help right away. Too much acetaminophen can be very dangerous and cause liver damage. Even if you do not have symptoms, it is important to get help right away. What side effects may I notice from receiving this medicine? Side effects that you should report to your doctor  or health care professional as soon as possible: -allergic reactions like skin rash, itching or hives, swelling of the face, lips, or tongue -breathing problems -confusion -feeling faint or lightheaded, falls -redness, blistering, peeling or loosening of the skin, including inside the mouth -seizure -stomach pain -yellowing of the eyes or skin Side effects that usually do not require medical attention (report to your doctor or health care professional if they continue or are bothersome): -constipation -nausea,  vomiting This list may not describe all possible side effects. Call your doctor for medical advice about side effects. You may report side effects to FDA at 1-800-FDA-1088. Where should I keep my medicine? Keep out of the reach of children. This medicine can be abused. Keep your medicine in a safe place to protect it from theft. Do not share this medicine with anyone. Selling or giving away this medicine is dangerous and against the law. This medicine may cause accidental overdose and death if it taken by other adults, children, or pets. Mix any unused medicine with a substance like cat litter or coffee grounds. Then throw the medicine away in a sealed container like a sealed bag or a coffee can with a lid. Do not use the medicine after the expiration date. Store at room temperature between 15 and 30 degrees C (59 and 86 degrees F). NOTE: This sheet is a summary. It may not cover all possible information. If you have questions about this medicine, talk to your doctor, pharmacist, or health care provider.    2016, Elsevier/Gold Standard. (2013-02-26 15:00:25)

## 2015-02-11 NOTE — Progress Notes (Signed)
MRN: [id number] DOB: 12/28/1945  Subjective:   Kayla [name] is a 70 y.o. female presenting for chief complaint of Urinary Tract Infection; Abrasion; Headache; and Medication Refill  UTI - Reports ~2 week history of urinary frequency, urinary urgency, pelvic pain, left flank pain. Has a history of renal stones, has had to undergo shockwave lithotripsy. Patient has taken Vesicare with minimal relief. Denies fever, dysuria, hematuria, cloudy urine, genital rashes.  Migraines - reports daily headaches, typically occur in the morning. Headaches are frontal and temporal. They are throbbing in nature, sharp, feels pressure behind her eyes. She is very concerned that 2 weeks ago, she had a severe "thunderous" headache while bearing down on the toilet. States that she felt/heard "something pop". Denies loss of consciousness, dizziness, confusion, difficulty speaking, slurred speech. She has a longstanding history of weakness due to her malnutrition which is secondary to her multiple abdominal surgeries. She reports that she has undergone multiple MRIs and CTs and one physician might have said that she has an aneurysm. She does have a neurologist and has not been back to see them. Currently, she takes Fioricet which helps with her headaches and would like a refill.  [name] has a current medication list which includes the following prescription(s): vitamin c, beta carotene w/minerals, butalbital-acetaminophen-caffeine, calcium-magnesium-zinc, celecoxib, cetirizine, vitamin d3, clonazepam, vitamin b-12, cyclobenzaprine, diclofenac sodium, dicyclomine, diltiazem, diltiazem hcl, docusate sodium, duloxetine, esomeprazole, fentanyl, fluorouracil, fluticasone, gabapentin, hydrocodone-acetaminophen, lidocaine, linaclotide, omega-3 fatty acids, polyethylene glycol powder, propranolol, pyridoxine, solifenacin, sucralfate, sumatriptan, triamcinolone, zoster vaccine live (pf), hypromellose, ketoconazole, loteprednol  etabonate, and trazodone. Also is allergic to codeine; oxycodone-acetaminophen; tylox; darvocet; darvon; food; lorcet; opium; wheat bran; zolpidem tartrate; zolpidem tartrate; amoxicillin; and penicillins.  Kayla [name]  has a past medical history of Barrett esophagus; History of kidney stones; Anxiety; IBS (irritable bowel syndrome); Complication of anesthesia; Angina; Chronic kidney disease; Depression; Hypertension; GERD (gastroesophageal reflux disease); DDD (degenerative disc disease); Osteoarthritis; Headache(784.0); Dysrhythmia; Peripheral vascular disease ([place]); Diabetes mellitus; Anemia; Fibromyalgia; Hiatal hernia; H/O hyperparathyroidism; Osteopetrosis; Allergy; Osteoporosis; Dementia; Macular degeneration; Fatigue; Headache; Migraines; Personal history of colonic polyps (07/22/2013); and Cataract. Also  has past surgical history that includes US ECHOCARDIOGRAPHY (01/21/2007); US ECHOCARDIOGRAPHY (08/30/2003); Cardiovascular stress test (10/03/2006); Esophagus surgery; Gastric bypass (1999); Appendectomy; Cholecystectomy; Abdominal hysterectomy; Gastric restriction surgery (1991); Tubal ligation; Right Knee Arthroscopy; Right Arm Surgery; Upper gastrointestinal endoscopy (06/23/2008); Colonoscopy (06/23/2008); Tonsillectomy; Cardiac catheterization (03/22/1991); stomach stappeling (1991); Dilation and curettage of uterus; Exploratory laparotomy [id number]); Stone extraction with basket (2012); and Rt wrist fx (2009).  Objective:   Vitals: BP 110/68 mmHg  Pulse 72  Temp(Src) 98.7 F (37.1 C) (Oral)  Resp 20  Ht 5\' 6"  (1.676 m)  Wt 107 lb 9.6 oz (48.807 kg)  BMI 17.38 kg/m2  SpO2 98%  Physical Exam  Constitutional: She is oriented to person, place, and time. She appears well-developed and well-nourished.  Patient appears cachectic.   HENT:  Mouth/Throat: Oropharynx is clear and moist.  Eyes: No scleral icterus.  Cardiovascular: Normal rate, regular rhythm and intact distal pulses.  Exam reveals no gallop  and no friction rub.   No murmur heard. Pulmonary/Chest: No respiratory distress. She has no wheezes. She has no rales.  Abdominal: Soft. Bowel sounds are normal. She exhibits no distension and no mass. There is no tenderness.  No CVA tenderness.  Musculoskeletal: She exhibits no edema.  Neurological: She is alert and oriented to person, place, and time. She has normal reflexes. No cranial nerve deficit. Coordination normal.  Skin:  Skin is warm and dry. There is pallor.  Psychiatric: She has a normal mood and affect.   Results for orders placed or performed in visit on 02/11/15 (from the past 24 hour(s))  POCT urinalysis dipstick     Status: Abnormal   Collection Time: 02/11/15  4:35 PM  Result Value Ref Range   Color, UA yellow yellow   Clarity, UA clear clear   Glucose, UA negative negative   Bilirubin, UA small (A) negative   Ketones, POC UA small (15) (A) negative   Spec Grav, UA 1.020    Blood, UA negative negative   pH, UA 5.0    Protein Ur, POC trace (A) negative   Urobilinogen, UA 1.0    Nitrite, UA Negative Negative   Leukocytes, UA small (1+) (A) Negative  POCT Microscopic Urinalysis ([facility])     Status: Abnormal   Collection Time: 02/11/15  4:35 PM  Result Value Ref Range   WBC,UR,HPF,POC Few (A) None WBC/hpf   RBC,UR,HPF,POC None None RBC/hpf   Bacteria Few (A) None, Too numerous to count   Mucus Present (A) Absent   Epithelial Cells, UR Per Microscopy Few (A) None, Too numerous to count cells/hpf   Assessment and Plan :   This case was precepted with Dr. [name].  1. Cystitis 2. Urinary frequency - Will start Augmentin for her UTI based off previous results.  - Urine culture pending  3. DISC DISEASE, CERVICAL 4. Chronic pain syndrome 5. Chronic nonintractable headache, unspecified headache type - Multifactorial, refilled her Fioricet, counseled her on the risks of using this medication. Referral back to neuro pending. I am reassured with her Neuro exam  today. Discussed case with Dr. [name] who has seen this patient for the past 20 years.  6. Personal history of colonic polyps - Requested referral for follow up with her new GI doctor who recommended she get yearly f/u. - Ambulatory referral to Gastroenterology   Jaynee Eagles, PA-C [facility] [phone] 02/11/2015 4:29 PM

## 2015-02-13 ENCOUNTER — Telehealth: Payer: Self-pay | Admitting: Urgent Care

## 2015-02-13 DIAGNOSIS — N390 Urinary tract infection, site not specified: Secondary | ICD-10-CM

## 2015-02-13 DIAGNOSIS — R32 Unspecified urinary incontinence: Secondary | ICD-10-CM

## 2015-02-13 LAB — URINE CULTURE

## 2015-02-13 NOTE — Telephone Encounter (Signed)
Discussed results with patient, she is improved. Will continue antibiotic course to completion. If she does not achieve complete resolution of symptoms, consider recheck or Keflex. Patient is also requesting referral to [facility] for consult of frequent UTIs and incontinence.

## 2015-02-18 ENCOUNTER — Telehealth: Payer: Self-pay

## 2015-02-18 NOTE — Telephone Encounter (Signed)
Pt states that she is going to need a stronger antibodic   Please call

## 2015-02-19 NOTE — Telephone Encounter (Signed)
Please check phone call  from 02/18/15. Pt was seen on 1/28 by [facility] for Cystitis - Primary. She has finished her amoxicillin-clavulanate (AUGMENTIN) 875-125 MG tablet [id number]. She still has lots of pain. She would like to be put on a stronger antibiotic. She said she spoke with Dr. [name] about this issue? Pharmacy:  [id number] [place], [place] - [address]. CB # [phone]

## 2015-02-20 ENCOUNTER — Telehealth: Payer: Self-pay

## 2015-02-20 MED ORDER — CEPHALEXIN 500 MG PO CAPS: 500.0000 mg | ORAL_CAPSULE | Freq: Two times a day (BID) | ORAL | Status: DC

## 2015-02-20 MED ORDER — FLUCONAZOLE 150 MG PO TABS: 150.0000 mg | ORAL_TABLET | Freq: Once | ORAL | Status: DC

## 2015-02-20 NOTE — Telephone Encounter (Signed)
Patient has done well with Keflex in the past. She plans on coming in for recheck and urine recollection tomorrow. She also has redness and itching of the vagina. Will send script for diflucan to cover for antibiotic-associated yeast infection.

## 2015-02-20 NOTE — Telephone Encounter (Signed)
Pt is now needing a rx for a yeast infection due to the first round of antibodics   Best number [phone]

## 2015-02-20 NOTE — Telephone Encounter (Signed)
Spoke to patient.  She said that [facility] told her that he would call her in a stronger antibiotic if her sx persisted.  She states that she is still having frequency and discomfort and may also have a yeast infection from the abx.  She states that she will come in tomorrow to have a female doctor check this but would like the abx called in today by 1:00 if possible.  I told her that may not happen because there is a 24 urn around on phone calls but we would do the best that we can.  She was very grateful.

## 2015-02-20 NOTE — Telephone Encounter (Signed)
This script was sent already. Please review previous telephone encounter.

## 2015-02-20 NOTE — Telephone Encounter (Signed)
Defer to Kayla [name], as he is in the office today.

## 2015-02-23 ENCOUNTER — Other Ambulatory Visit: Payer: Self-pay

## 2015-02-23 DIAGNOSIS — F411 Generalized anxiety disorder: Secondary | ICD-10-CM

## 2015-02-23 DIAGNOSIS — M542 Cervicalgia: Secondary | ICD-10-CM | POA: Diagnosis not present

## 2015-02-23 DIAGNOSIS — M797 Fibromyalgia: Secondary | ICD-10-CM | POA: Diagnosis not present

## 2015-02-23 NOTE — Telephone Encounter (Signed)
Pt called requesting refills for   Original Order:  diclofenac sodium (VOLTAREN) 1 % GEL [id number] 2 request       #1 local Pharmacy: [facility]  #2 Pharmacy:  [place], [place] Order:  clonazePAM (KLONOPIN) 1 MG tablet [id number]         Pharmacy:  [place], [place]          [phone] VM okay

## 2015-02-24 MED ORDER — DICLOFENAC SODIUM 1 % TD GEL: 2.0000 g | Freq: Four times a day (QID) | TRANSDERMAL | Status: DC

## 2015-02-24 MED ORDER — CLONAZEPAM 1 MG PO TABS: 0.5000 mg | ORAL_TABLET | Freq: Two times a day (BID) | ORAL | Status: DC | PRN

## 2015-02-24 NOTE — Telephone Encounter (Signed)
Dr [name], I spoke to pt who stated that [facility] sent her refills of these recently, but she needs to get the next refills on file there because it takes them a while to get them to her. She stated that they will not send refills out until they are due. Discussed with pt that she would like her RFs in 90 day supplies and asked if she would like 3 tubes of voltaren gel ordered at a time. She stated she uses this several places on her body and really uses more like 2 tubes a month. I calculated quantity for dosage Rxd and it is actually 240 g /month. I will pend for 700 g (for 3 mos supply which will be 7 tubes).

## 2015-02-27 NOTE — Telephone Encounter (Signed)
These need to be faxed. I will put in Dr Doolittle's box for signature.

## 2015-02-28 ENCOUNTER — Ambulatory Visit: Payer: Medicare Other | Admitting: Psychiatry

## 2015-03-01 NOTE — Telephone Encounter (Signed)
Faxed Rxs to [facility] and notified pt.

## 2015-03-07 DIAGNOSIS — K648 Other hemorrhoids: Secondary | ICD-10-CM | POA: Diagnosis not present

## 2015-03-07 DIAGNOSIS — Z1211 Encounter for screening for malignant neoplasm of colon: Secondary | ICD-10-CM | POA: Diagnosis not present

## 2015-03-07 DIAGNOSIS — R1010 Upper abdominal pain, unspecified: Secondary | ICD-10-CM | POA: Diagnosis not present

## 2015-03-07 DIAGNOSIS — Z800 Family history of malignant neoplasm of digestive organs: Secondary | ICD-10-CM | POA: Diagnosis not present

## 2015-03-07 DIAGNOSIS — D122 Benign neoplasm of ascending colon: Secondary | ICD-10-CM | POA: Diagnosis not present

## 2015-03-07 DIAGNOSIS — R634 Abnormal weight loss: Secondary | ICD-10-CM | POA: Diagnosis not present

## 2015-03-08 ENCOUNTER — Ambulatory Visit (INDEPENDENT_AMBULATORY_CARE_PROVIDER_SITE_OTHER): Payer: Medicare Other | Admitting: Internal Medicine

## 2015-03-08 ENCOUNTER — Encounter: Payer: Self-pay | Admitting: Internal Medicine

## 2015-03-08 VITALS — BP 102/56 | HR 82 | Temp 98.5°F | Resp 16 | Ht 65.5 in | Wt 102.0 lb

## 2015-03-08 DIAGNOSIS — N390 Urinary tract infection, site not specified: Secondary | ICD-10-CM

## 2015-03-08 DIAGNOSIS — K5900 Constipation, unspecified: Secondary | ICD-10-CM | POA: Diagnosis not present

## 2015-03-08 DIAGNOSIS — R634 Abnormal weight loss: Secondary | ICD-10-CM | POA: Diagnosis not present

## 2015-03-08 DIAGNOSIS — L989 Disorder of the skin and subcutaneous tissue, unspecified: Secondary | ICD-10-CM

## 2015-03-08 DIAGNOSIS — F329 Major depressive disorder, single episode, unspecified: Secondary | ICD-10-CM

## 2015-03-08 DIAGNOSIS — K2100 Gastro-esophageal reflux disease with esophagitis, without bleeding: Secondary | ICD-10-CM

## 2015-03-08 DIAGNOSIS — F411 Generalized anxiety disorder: Secondary | ICD-10-CM | POA: Diagnosis not present

## 2015-03-08 DIAGNOSIS — Z87442 Personal history of urinary calculi: Secondary | ICD-10-CM | POA: Diagnosis not present

## 2015-03-08 DIAGNOSIS — N3010 Interstitial cystitis (chronic) without hematuria: Secondary | ICD-10-CM

## 2015-03-08 DIAGNOSIS — K5909 Other constipation: Secondary | ICD-10-CM

## 2015-03-08 DIAGNOSIS — E559 Vitamin D deficiency, unspecified: Secondary | ICD-10-CM | POA: Insufficient documentation

## 2015-03-08 LAB — CBC WITH DIFFERENTIAL/PLATELET
BASOS ABS: 0.1 10*3/uL (ref 0.0–0.1)
BASOS PCT: 1 % (ref 0–1)
EOS ABS: 0.3 10*3/uL (ref 0.0–0.7)
EOS PCT: 6 % — AB (ref 0–5)
HCT: 40.1 % (ref 36.0–46.0)
Hemoglobin: 13.4 g/dL (ref 12.0–15.0)
Lymphocytes Relative: 36 % (ref 12–46)
Lymphs Abs: 1.9 10*3/uL (ref 0.7–4.0)
MCH: 29.1 pg (ref 26.0–34.0)
MCHC: 33.4 g/dL (ref 30.0–36.0)
MCV: 87.2 fL (ref 78.0–100.0)
MPV: 10 fL (ref 8.6–12.4)
Monocytes Absolute: 0.4 10*3/uL (ref 0.1–1.0)
Monocytes Relative: 8 % (ref 3–12)
NEUTROS PCT: 49 % (ref 43–77)
Neutro Abs: 2.6 10*3/uL (ref 1.7–7.7)
PLATELETS: 207 10*3/uL (ref 150–400)
RBC: 4.6 MIL/uL (ref 3.87–5.11)
RDW: 14.2 % (ref 11.5–15.5)
WBC: 5.3 10*3/uL (ref 4.0–10.5)

## 2015-03-08 MED ORDER — DULOXETINE HCL 30 MG PO CPEP: 30.0000 mg | ORAL_CAPSULE | Freq: Every day | ORAL | Status: DC

## 2015-03-08 NOTE — Patient Instructions (Addendum)
Current outpatient prescriptions:  .  Ascorbic Acid (VITAMIN C) 1000 MG tablet, Take 1,000 mg by mouth daily., Disp: , Rfl:  .  beta carotene w/minerals (OCUVITE) tablet, Take 1 tablet by mouth 2 (two) times daily., Disp: , Rfl:   .  butalbital-acetaminophen-caffeine (FIORICET, ESGIC) 50-325-40 MG tablet, Take 1 tablet by mouth every 6 (six) hours as needed for headache. Headache, Disp: 120 tablet, Rfl: 0////actually uses qid and this seems to work well to prevent full blown migraines///long term use///also works for daily tension HAs  .  CALCIUM-MAGNESIUM-ZINC PO, Take by mouth daily  .  celecoxib (CELEBREX) 200 MG capsule, Take 1 capsule (200 mg total) by mouth daily., Disp: 90 capsule, Rfl: 3: ---hurts so much all over she can't tell if this is working///will stop as test  .  cetirizine (ZYRTEC) 10 MG tablet, Take 10 mg by mouth daily., Disp: , Rfl  .  Cholecalciferol (VITAMIN D3) 2000 UNITS TABS, Take 2,000 Units by mouth daily., Disp: , Rfl:   .  clonazePAM (KLONOPIN) 1 MG tablet, Take 0.5-1 tablets (0.5-1 mg total) by mouth 2 (two) times daily as needed. anxiety/panic attacks,--uses twice a day for agitation and it works  .  Cyanocobalamin (VITAMIN B-12) 1000 MCG SUBL, Place 5,000 mcg under the tongue daily  .  cyclobenzaprine (FLEXERIL) 10 MG tablet, Take 1 tablet (10 mg total) by mouth 3 (three) times daily as needed for muscle spasms.--Not taking now  .  diclofenac sodium (VOLTAREN) 1 % GEL, Apply 2 g topically 4 (four) times daily., Disp: 700 g, Rfl: 1  .  dicyclomine (BENTYL) 10 MG capsule, Take 1-2 tab 3 times daily AC as needed for spasms and cramping., Disp: 270 capsule, Rfl: 3 uses daily and helpful  .  diltiazem 2 % GEL, Apply 1 application topically 2 (two) times daily., Disp: 30 g, Rfl: 0  .  docusate sodium (COLACE) 250 MG capsule, Take 250 mg by mouth daily., Disp: , Rfl:   .  DULoxetine (CYMBALTA) 30 MG capsule, Take 1 capsule (30 mg total) by mouth daily.---quit  cause was better and now is crying all the time and wants to restart//worries about kin all dying//saw Ala Dach cousel but took break--now needs restart  .  esomeprazole (NEXIUM) 40 MG capsule, Take 1 capsule (40 mg total) by mouth daily before breakfast.---daily but may stop to see effect on HA  .  fentaNYL (DURAGESIC - DOSED MCG/HR) 75 MCG/HR, Place 1 patch onto the skin every 3 (three) days., Disp: , Rfl:   .  gabapentin (NEURONTIN) 300 MG capsule, Take 1 capsule (300 mg total) by mouth 3 (three) times daily., Disp: 270 capsule, Rfl: 3---uses prn for occas inflamm in legs .  HYDROcodone-acetaminophen (NORCO) 7.5-325 MG per tablet, Take 1 tablet by mouth every 6 (six) hours as needed. Pain , Disp: , Rfl: ----uses 1-2 per daily///dr Tananis  .  lidocaine (LIDODERM) 5 %, Place 1 patch onto the skin as needed. Remove & Discard patch within 12 hours. May dispense as 3 month supply, Disp: 90 patch, Rfl: 3---knees/post surgery--helpful .  Linaclotide (LINZESS) 145 MCG CAPS capsule, Take 1 capsule (145 mcg total) by mouth daily., Disp: 90 capsule, Rfl: 3  .  Loteprednol Etabonate (LOTEMAX) 0.5 % GEL, Place 1 drop into both eyes 2 (two) times daily. Reported on 02/11/2015,OFF now  .  Omega-3 Fatty Acids (SEA-OMEGA PO), Take 1,000 mg by mouth daily., Disp: , Rfl:   .  propranolol (INDERAL) 10 MG tablet, TAKE  1 TABLET BY MOUTH 4 TIMES A DAY, Disp: 60 tablet, Rfl: 10  .  pyridoxine (B-6) 200 MG tablet, Take 200 mg by mouth daily., Disp: , Rfl:  .  solifenacin (VESICARE) 5 MG tablet, Take 1 tablet (5 mg total) by mouth daily.,reduces nocturia to once from 3  To stop all vitamins for 1 month  .  SUMAtriptan (IMITREX) 100 MG tablet, Take 1 tablet (100 mg total) by mouth every 3 (three) days as needed for migraine. Headache, Disp: 10 tablet, Rfl: 5////works for migraines happening 1-2 per week///has appt with [facility] in march to consider further w/u--has been seen by C Rose at [facility] neuro in past and [facility]  .  traZODone (DESYREL) 150 MG tablet, Take 1 tablet (150 mg total) by mouth at bedtime. And 1/2 -1 if wakes at 4am. Taking 1/2 hs and works but wakes early///feels a bit drugged in am so will cut to 1/4 and try to quit  .  polyethylene glycol powder (GLYCOLAX/MIRALAX) powder, Take 17 g by mouth 2 (two) times daily. Constip due to meds? .  zoster vaccine live, PF, (ZOSTAVAX) 44034 UNT/0.65ML injection, Inject 19,400 Units into the skin once. Administer at pharmacy (Patient not taking: Reported on 03/08/2015), Disp: 1 each, Rfl: 0    Dr Margaretmary Eddy

## 2015-03-08 NOTE — Progress Notes (Signed)
Subjective:    Patient ID: Kayla [name], female    DOB: [birth year], 70 y.o.   MRN: [id number]  HPI she is here for medication refills, and evaluations of her current problems rather than her annual exam. Patient Active Problem List   Diagnosis Date Noted  . H/O gastric bypass--many yrs ago 02/11/2014  . Chronic maxillary sinusitis 09/15/2013  . Personal history of colonic polyps 07/22/2013  . HA (headache)-chronic/tension type 07/28/2012  . Osteoporosis, unspecified 07/28/2012  . Unspecified vitamin D deficiency-2009 07/28/2012  . Nephrolithiasis---last one  long ago---lithotrip 07/28/2012  . Fibromyalgia 07/26/2012  . Chronic interstitial cystitis---recent pains unclear etio--see rec utis///would like to go back to urology for eval 10/16/2011  . Chronic constipation 10/04/2010  . Anxiety state 03/18/2007  . Reactive depression 03/18/2007  . GERD 03/18/2007  . OSTEOARTHRITIS 03/18/2007  . [place] DISEASE, CERVICAL 03/18/2007  . DISC DISEASE, LUMBAR 03/18/2007   See endo/col[facility] Dr Liane Comber this week--OK--did bx intest to r/o sprue anesthesio said heart irregular at times Dr Acie Fredrickson says OK--cont propran  Dr Vilinda Flake pain mgmt= has agreed to decrease the fentanyl and possibly stop//voltaren gel is best of her meds   Current outpatient prescriptions:  .  Ascorbic Acid (VITAMIN C) 1000 MG tablet, Take 1,000 mg by mouth daily., Disp: , Rfl:  .  beta carotene w/minerals (OCUVITE) tablet, Take 1 tablet by mouth 2 (two) times daily., Disp: , Rfl:   .  butalbital-acetaminophen-caffeine (FIORICET, ESGIC) 50-325-40 MG tablet, Take 1 tablet by mouth every 6 (six) hours as needed for headache. Headache, Disp: 120 tablet, Rfl: 0////actually uses qid and this seems to work well to prevent full blown migraines///long term use///also works for daily tension HAs  .  CALCIUM-MAGNESIUM-ZINC PO, Take by mouth daily  .  celecoxib (CELEBREX) 200 MG capsule, Take 1 capsule (200 mg total) by mouth daily.,  Disp: 90 capsule, Rfl: 3: ---hurts so much all over she can't tell if this is working///will stop as test  .  cetirizine (ZYRTEC) 10 MG tablet, Take 10 mg by mouth daily., Disp: , Rfl  .  Cholecalciferol (VITAMIN D3) 2000 UNITS TABS, Take 2,000 Units by mouth daily., Disp: , Rfl:   .  clonazePAM (KLONOPIN) 1 MG tablet, Take 0.5-1 tablets (0.5-1 mg total) by mouth 2 (two) times daily as needed. anxiety/panic attacks,--uses twice a day for agitation and it works  .  Cyanocobalamin (VITAMIN B-12) 1000 MCG SUBL, Place 5,000 mcg under the tongue daily  .  cyclobenzaprine (FLEXERIL) 10 MG tablet, Take 1 tablet (10 mg total) by mouth 3 (three) times daily as needed for muscle spasms.--Not taking now  .  diclofenac sodium (VOLTAREN) 1 % GEL, Apply 2 g topically 4 (four) times daily., Disp: 700 g, Rfl: 1  .  dicyclomine (BENTYL) 10 MG capsule, Take 1-2 tab 3 times daily AC as needed for spasms and cramping., Disp: 270 capsule, Rfl: 3 uses daily and helpful  .  diltiazem 2 % GEL, Apply 1 application topically 2 (two) times daily., Disp: 30 g, Rfl: 0  .  docusate sodium (COLACE) 250 MG capsule, Take 250 mg by mouth daily., Disp: , Rfl:   .  DULoxetine (CYMBALTA) 30 MG capsule, Take 1 capsule (30 mg total) by mouth daily.---quit cause was better and now is crying all the time and wants to restart//worries about kin all dying//saw Ala Dach cousel but took break--now needs restart  .  esomeprazole (NEXIUM) 40 MG capsule, Take 1 capsule (40 mg total) by  mouth daily before breakfast.---daily but may stop to see effect on HA  .  fentaNYL (DURAGESIC - DOSED MCG/HR) 75 MCG/HR, Place 1 patch onto the skin every 3 (three) days., Disp: , Rfl:   .  gabapentin (NEURONTIN) 300 MG capsule, Take 1 capsule (300 mg total) by mouth 3 (three) times daily., Disp: 270 capsule, Rfl: 3---uses prn for occas inflamm in legs .  HYDROcodone-acetaminophen (NORCO) 7.5-325 MG per tablet, Take 1 tablet by mouth every 6 (six) hours  as needed. Pain , Disp: , Rfl: ----uses 1-2 per daily///dr Tananis  .  lidocaine (LIDODERM) 5 %, Place 1 patch onto the skin as needed. Remove & Discard patch within 12 hours. May dispense as 3 month supply, Disp: 90 patch, Rfl: 3---knees/post surgery--helpful .  Linaclotide (LINZESS) 145 MCG CAPS capsule, Take 1 capsule (145 mcg total) by mouth daily., Disp: 90 capsule, Rfl: 3  .  Loteprednol Etabonate (LOTEMAX) 0.5 % GEL, Place 1 drop into both eyes 2 (two) times daily. Reported on 02/11/2015,OFF now  .  Omega-3 Fatty Acids (SEA-OMEGA PO), Take 1,000 mg by mouth daily., Disp: , Rfl:   .  propranolol (INDERAL) 10 MG tablet, TAKE 1 TABLET BY MOUTH 4 TIMES A DAY, Disp: 60 tablet, Rfl: 10  .  pyridoxine (B-6) 200 MG tablet, Take 200 mg by mouth daily., Disp: , Rfl:  .  solifenacin (VESICARE) 5 MG tablet, Take 1 tablet (5 mg total) by mouth daily.,reduces nocturia to once from 3  To stop all vitamins for 1 month  .  SUMAtriptan (IMITREX) 100 MG tablet, Take 1 tablet (100 mg total) by mouth every 3 (three) days as needed for migraine. Headache, Disp: 10 tablet, Rfl: 5////works for migraines happening 1-2 per week///has appt with [facility] in march to consider further w/u--has been seen by C Rose at [facility] neuro in past and [facility]  .  traZODone (DESYREL) 150 MG tablet, Take 1 tablet (150 mg total) by mouth at bedtime. And 1/2 -1 if wakes at 4am. Taking 1/2 hs and works but wakes early///feels a bit drugged in am so will cut to 1/4 and try to quit  .  polyethylene glycol powder (GLYCOLAX/MIRALAX) powder, Take 17 g by mouth 2 (two) times daily. Constip due to meds? .  zoster vaccine live, PF, (ZOSTAVAX) 37342 UNT/0.65ML injection, Inject 19,400 Units into the skin once. Administer at pharmacy (Patient not taking: Reported on 03/08/2015), Disp: 1 each, Rfl: 0  Doesn't leave home often/socially isolated/feels bad most of time--worries constantly  Wants to stop as many meds as possible  Review of  Systems  Constitutional: Positive for fatigue. Negative for activity change and appetite change.       Slight wt loss?  HENT: Negative for hearing loss and voice change.   Eyes: Negative for photophobia and visual disturbance.  Respiratory: Negative for cough, chest tightness and wheezing.   Cardiovascular: Negative for chest pain, palpitations and leg swelling.  Gastrointestinal:       Frequent GI complaints under eval Dr Liane Comber  Genitourinary: Positive for dysuria, urgency, frequency and difficulty urinating.  Musculoskeletal:       Hurts all over in muscles bones and joints without swelling or redness or paresthesias  Neurological: Positive for tremors, weakness, light-headedness and headaches. Negative for syncope and speech difficulty.  Hematological: Negative for adenopathy. Does not bruise/bleed easily.  Psychiatric/Behavioral:       Sleep irregular   No energy and little appetite    Objective:   Physical  Exam  Constitutional: She is oriented to person, place, and time. She appears well-developed and well-nourished. No distress.  HENT:  Head: Normocephalic and atraumatic.  Eyes: Pupils are equal, round, and reactive to light.  Neck: Normal range of motion.  Cardiovascular: Normal rate and regular rhythm.   Pulmonary/Chest: Effort normal. No respiratory distress.  Musculoskeletal: Normal range of motion.  Neurological: She is alert and oriented to person, place, and time.  Skin: Skin is warm and dry.  R cheek with small scab in center of macular blush  Psychiatric: She has a normal mood and affect. Her behavior is normal.  Nursing note and vitals reviewed.  BP 102/56 mmHg  Pulse 82  Temp(Src) 98.5 F (36.9 C) (Oral)  Resp 16  Ht 5' 5.5" (1.664 m)  Wt 102 lb (46.267 kg)  BMI 16.71 kg/m2  SpO2 97% Wt Readings from Last 3 Encounters:  03/08/15 102 lb (46.267 kg)  02/11/15 107 lb 9.6 oz (48.807 kg)  11/23/14 105 lb (47.628 kg)       Assessment & Plan:  Recurrent  UTI - Plan: Urine culture Chronic interstitial cystitis Hx of urinary stone  --, Ambulatory referral to Urology  Reactive depression Anxiety state  Gastroesophageal reflux disease with esophagitis - WEIGHT LOSS - Chronic constipation  Chronic headache syndrome --She is currently involved in a new neurological evaluation with upcoming scan  Chronic pain syndrome --Dr Derwood Kaplan in [facility]  Skin lesion of face - Plan: Ambulatory referral to Dermatology  Vitamin D deficiency -continue supplements   Now that I am retiring in May, she would like to change to Dr Dennard Schaumann right near her house --so will try to establish there  Meds ordered this encounter  Medications  . DULoxetine (CYMBALTA) 30 MG capsule    Sig: Take 1 capsule (30 mg total) by mouth daily.    Dispense:  90 capsule    Refill:  3  she wanted to restart this for depression anxiety and chronic pain  Labs this visit Results for orders placed or performed in visit on 03/08/15  Urine culture  Result Value Ref Range   Colony Count NO GROWTH    Organism ID, Bacteria NO GROWTH   CBC with Differential/Platelet  Result Value Ref Range   WBC 5.3 4.0 - 10.5 K/uL   RBC 4.60 3.87 - 5.11 MIL/uL   Hemoglobin 13.4 12.0 - 15.0 g/dL   HCT 40.1 36.0 - 46.0 %   MCV 87.2 78.0 - 100.0 fL   MCH 29.1 26.0 - 34.0 pg   MCHC 33.4 30.0 - 36.0 g/dL   RDW 14.2 11.5 - 15.5 %   Platelets 207 150 - 400 K/uL   MPV 10.0 8.6 - 12.4 fL   Neutrophils Relative % 49 43 - 77 %   Neutro Abs 2.6 1.7 - 7.7 K/uL   Lymphocytes Relative 36 12 - 46 %   Lymphs Abs 1.9 0.7 - 4.0 K/uL   Monocytes Relative 8 3 - 12 %   Monocytes Absolute 0.4 0.1 - 1.0 K/uL   Eosinophils Relative 6 (H) 0 - 5 %   Eosinophils Absolute 0.3 0.0 - 0.7 K/uL   Basophils Relative 1 0 - 1 %   Basophils Absolute 0.1 0.0 - 0.1 K/uL   Smear Review Criteria for review not met   Comprehensive metabolic panel  Result Value Ref Range   Sodium 142 135 - 146 mmol/L   Potassium 4.3  3.5 - 5.3 mmol/L   Chloride 102 98 -  110 mmol/L   CO2 26 20 - 31 mmol/L   Glucose, Bld 97 65 - 99 mg/dL   BUN 10 7 - 25 mg/dL   Creat 0.61 0.50 - 0.99 mg/dL   Total Bilirubin 0.4 0.2 - 1.2 mg/dL   Alkaline Phosphatase 94 33 - 130 U/L   AST 24 10 - 35 U/L   ALT 19 6 - 29 U/L   Total Protein 6.9 6.1 - 8.1 g/dL   Albumin 4.2 3.6 - 5.1 g/dL   Calcium 9.4 8.6 - 10.4 mg/dL  TSH  Result Value Ref Range   TSH 1.47 mIU/L  Hepatitis C antibody  Result Value Ref Range   HCV Ab NEGATIVE NEGATIVE

## 2015-03-09 LAB — COMPREHENSIVE METABOLIC PANEL
ALT: 19 U/L (ref 6–29)
AST: 24 U/L (ref 10–35)
Albumin: 4.2 g/dL (ref 3.6–5.1)
Alkaline Phosphatase: 94 U/L (ref 33–130)
BILIRUBIN TOTAL: 0.4 mg/dL (ref 0.2–1.2)
BUN: 10 mg/dL (ref 7–25)
CO2: 26 mmol/L (ref 20–31)
CREATININE: 0.61 mg/dL (ref 0.50–0.99)
Calcium: 9.4 mg/dL (ref 8.6–10.4)
Chloride: 102 mmol/L (ref 98–110)
Glucose, Bld: 97 mg/dL (ref 65–99)
POTASSIUM: 4.3 mmol/L (ref 3.5–5.3)
SODIUM: 142 mmol/L (ref 135–146)
TOTAL PROTEIN: 6.9 g/dL (ref 6.1–8.1)

## 2015-03-09 LAB — HEPATITIS C ANTIBODY: HCV AB: NEGATIVE

## 2015-03-09 LAB — URINE CULTURE
COLONY COUNT: NO GROWTH
ORGANISM ID, BACTERIA: NO GROWTH

## 2015-03-09 LAB — TSH: TSH: 1.47 m[IU]/L

## 2015-03-13 ENCOUNTER — Ambulatory Visit: Payer: Medicare Other | Admitting: Neurology

## 2015-03-13 ENCOUNTER — Telehealth: Payer: Self-pay

## 2015-03-13 DIAGNOSIS — K602 Anal fissure, unspecified: Secondary | ICD-10-CM

## 2015-03-13 NOTE — Telephone Encounter (Signed)
PT IS REQUESTING A REFILL OF diltiazem 2 % GEL. WE PRESCRIBED THIS TO HER ONE YEAR AGO, YET THE PROBLEM HAS REOCCURRED.

## 2015-03-14 NOTE — Telephone Encounter (Signed)
Anal fissure - Plan: diltiazem 2 % GEL, Lidocaine, Anorectal, 5 % GEL  Seen by Windell Hummingbird 02/11/2014

## 2015-03-15 ENCOUNTER — Telehealth: Payer: Self-pay

## 2015-03-15 NOTE — Telephone Encounter (Signed)
Dr. Doolittle  Please see previous message 

## 2015-03-15 NOTE — Telephone Encounter (Signed)
Pt wants Dr. [name] to know that [place] doesn't accept Medicare so she will need to be referred to another practice please. Dr. Gracelyn Nurse in [place] states that she doesn't have Celiac disease So Dr. [name] is just going to have to work harder on helping her gain weight.

## 2015-03-15 NOTE — Telephone Encounter (Signed)
Pt would like to know results of her lab results because she is still using the restroom more frequently as before. Please call [phone]

## 2015-03-15 NOTE — Telephone Encounter (Signed)
LM to RTC for RX.

## 2015-03-15 NOTE — Telephone Encounter (Signed)
Patient needs to RTC since we have not seen her for this issue in 1 year.

## 2015-03-23 DIAGNOSIS — M797 Fibromyalgia: Secondary | ICD-10-CM | POA: Diagnosis not present

## 2015-03-23 DIAGNOSIS — M542 Cervicalgia: Secondary | ICD-10-CM | POA: Diagnosis not present

## 2015-04-06 ENCOUNTER — Telehealth: Payer: Self-pay

## 2015-04-06 ENCOUNTER — Ambulatory Visit: Payer: Medicare Other | Admitting: Neurology

## 2015-04-06 NOTE — Telephone Encounter (Signed)
Pt did not show for their appt with Dr. Dohmeier today.  

## 2015-04-06 NOTE — Addendum Note (Signed)
Addended by: Wyatt Haste on: 04/06/2015 09:31 AM   Modules accepted: Miquel Dunn

## 2015-04-07 ENCOUNTER — Encounter: Payer: Self-pay | Admitting: Neurology

## 2015-05-05 DIAGNOSIS — L821 Other seborrheic keratosis: Secondary | ICD-10-CM | POA: Diagnosis not present

## 2015-05-05 DIAGNOSIS — I789 Disease of capillaries, unspecified: Secondary | ICD-10-CM | POA: Diagnosis not present

## 2015-05-05 DIAGNOSIS — L814 Other melanin hyperpigmentation: Secondary | ICD-10-CM | POA: Diagnosis not present

## 2015-05-08 NOTE — Telephone Encounter (Signed)
Which you please search for practicing accepting Medicare patients that is not too far from her home which I believe is out near [facility]. Kayla [name] is an option but at last check was slow to accept Medicare If close enough you might try [facility] [place] Or the [facility] in [place]

## 2015-05-09 ENCOUNTER — Telehealth: Payer: Self-pay

## 2015-05-09 NOTE — Telephone Encounter (Signed)
I checked on the practices listed.  The closest for the patient is [facility], and they are accepting new Medicare patients.  They said they could get her in with a nurse practitioner as soon as two weeks, if desired.  I spoke to the patient to give her the information for this facility, and she seemed agreeable to this location.

## 2015-05-09 NOTE — Telephone Encounter (Signed)
Excellent

## 2015-05-11 NOTE — Telephone Encounter (Signed)
error 

## 2015-05-18 ENCOUNTER — Other Ambulatory Visit: Payer: Self-pay

## 2015-05-18 DIAGNOSIS — K3189 Other diseases of stomach and duodenum: Secondary | ICD-10-CM

## 2015-05-18 DIAGNOSIS — G43C0 Periodic headache syndromes in child or adult, not intractable: Secondary | ICD-10-CM

## 2015-05-18 DIAGNOSIS — M545 Low back pain, unspecified: Secondary | ICD-10-CM

## 2015-05-18 DIAGNOSIS — K2100 Gastro-esophageal reflux disease with esophagitis, without bleeding: Secondary | ICD-10-CM

## 2015-05-18 DIAGNOSIS — K449 Diaphragmatic hernia without obstruction or gangrene: Secondary | ICD-10-CM

## 2015-05-18 NOTE — Telephone Encounter (Signed)
Pt is checking on status of her refill request of meds that should have been sent to her mail order last week   She needs vasacort, anti depressant , voltaren , lidocaine patches, neurontin cymbalta, nexium, migraine meds, flonase or nasacort she was not sure which one she is currently using   Please call [phone]

## 2015-05-18 NOTE — Telephone Encounter (Signed)
Dr [name], do you want to OK 6 mos of RFs of these meds for pt from [place]? She has contacted the new PCP office you recommended and they can not see pt until some time in Aug, and one 90 day RF will not be enough. You have not Rxd Miralax for pt before, but I didn't think you would mind Rxing it so I pended it also.

## 2015-05-20 MED ORDER — PROPRANOLOL HCL 10 MG PO TABS: 10.0000 mg | ORAL_TABLET | Freq: Four times a day (QID) | ORAL | Status: DC

## 2015-05-20 MED ORDER — LINACLOTIDE 145 MCG PO CAPS: 145.0000 ug | ORAL_CAPSULE | Freq: Every day | ORAL | Status: DC

## 2015-05-20 MED ORDER — POLYETHYLENE GLYCOL 3350 17 GM/SCOOP PO POWD: 17.0000 g | Freq: Two times a day (BID) | ORAL | Status: DC

## 2015-05-20 MED ORDER — GABAPENTIN 300 MG PO CAPS: 300.0000 mg | ORAL_CAPSULE | Freq: Three times a day (TID) | ORAL | Status: DC

## 2015-05-20 MED ORDER — DULOXETINE HCL 30 MG PO CPEP: 30.0000 mg | ORAL_CAPSULE | Freq: Every day | ORAL | Status: DC

## 2015-05-20 MED ORDER — SUMATRIPTAN SUCCINATE 100 MG PO TABS: 100.0000 mg | ORAL_TABLET | ORAL | Status: DC | PRN

## 2015-05-20 MED ORDER — DICLOFENAC SODIUM 1 % TD GEL: 2.0000 g | Freq: Four times a day (QID) | TRANSDERMAL | Status: DC

## 2015-05-20 MED ORDER — TRIAMCINOLONE ACETONIDE 55 MCG/ACT NA AERO: 2.0000 | INHALATION_SPRAY | Freq: Every day | NASAL | Status: DC

## 2015-05-20 MED ORDER — SOLIFENACIN SUCCINATE 5 MG PO TABS: 5.0000 mg | ORAL_TABLET | Freq: Every day | ORAL | Status: DC

## 2015-05-20 MED ORDER — ESOMEPRAZOLE MAGNESIUM 40 MG PO CPDR: 40.0000 mg | DELAYED_RELEASE_CAPSULE | Freq: Every day | ORAL | Status: DC

## 2015-05-22 NOTE — Telephone Encounter (Signed)
Faxed Rxs. Pt aware that we are sending in RFs.

## 2015-06-16 DIAGNOSIS — G894 Chronic pain syndrome: Secondary | ICD-10-CM | POA: Diagnosis not present

## 2015-07-07 ENCOUNTER — Encounter: Payer: Self-pay | Admitting: Genetic Counselor

## 2015-07-26 ENCOUNTER — Telehealth: Payer: Self-pay | Admitting: Neurology

## 2015-07-26 ENCOUNTER — Ambulatory Visit: Payer: Medicare Other | Admitting: Neurology

## 2015-07-26 NOTE — Telephone Encounter (Signed)
Patient called 7/12 9:49am to cancel same day New Patient appointment with Dr. Brett Fairy, states husband is at [facility]. And won't be back in time. (patient cx's < 24 hrs for 04/06/15 New Patient appointment, patient cancelled 03/13/15 New Patient appointment).

## 2015-07-26 NOTE — Telephone Encounter (Signed)
Dismiss

## 2015-07-26 NOTE — Telephone Encounter (Signed)
This constitutes 2 no shows for this pt. She is a new patient. Per [facility] policy, this is grounds for dismissal.  Ok to dismiss?  Same day cancel/no show-04/06/2015 Same day cancel/no show- 07/26/2015

## 2015-07-27 ENCOUNTER — Encounter: Payer: Self-pay | Admitting: Neurology

## 2015-08-16 DIAGNOSIS — M797 Fibromyalgia: Secondary | ICD-10-CM | POA: Diagnosis not present

## 2015-08-16 DIAGNOSIS — M542 Cervicalgia: Secondary | ICD-10-CM | POA: Diagnosis not present

## 2015-08-19 ENCOUNTER — Ambulatory Visit (HOSPITAL_COMMUNITY)
Admission: EM | Admit: 2015-08-19 | Discharge: 2015-08-19 | Disposition: A | Discharge status: Home / Self Care | Payer: Medicare Other | Attending: Surgery | Admitting: Surgery

## 2015-08-19 ENCOUNTER — Encounter (HOSPITAL_COMMUNITY): Payer: Self-pay | Admitting: *Deleted

## 2015-08-19 DIAGNOSIS — Z880 Allergy status to penicillin: Secondary | ICD-10-CM | POA: Insufficient documentation

## 2015-08-19 DIAGNOSIS — N390 Urinary tract infection, site not specified: Secondary | ICD-10-CM | POA: Diagnosis present

## 2015-08-19 DIAGNOSIS — N3000 Acute cystitis without hematuria: Secondary | ICD-10-CM | POA: Insufficient documentation

## 2015-08-19 DIAGNOSIS — Z79899 Other long term (current) drug therapy: Secondary | ICD-10-CM | POA: Insufficient documentation

## 2015-08-19 DIAGNOSIS — Z888 Allergy status to other drugs, medicaments and biological substances status: Secondary | ICD-10-CM | POA: Insufficient documentation

## 2015-08-19 DIAGNOSIS — Z885 Allergy status to narcotic agent status: Secondary | ICD-10-CM | POA: Diagnosis not present

## 2015-08-19 LAB — POCT URINALYSIS DIP (DEVICE)
Glucose, UA: 500 mg/dL — AB
KETONES UR: 15 mg/dL — AB
Nitrite: POSITIVE — AB
PH: 5 (ref 5.0–8.0)
Protein, ur: 100 mg/dL — AB
Specific Gravity, Urine: 1.01 (ref 1.005–1.030)
Urobilinogen, UA: >=8 mg/dL (ref 0.0–1.0)

## 2015-08-19 MED ORDER — CEPHALEXIN 500 MG PO CAPS: 500.0000 mg | ORAL_CAPSULE | Freq: Two times a day (BID) | ORAL | 0 refills | Status: DC

## 2015-08-19 NOTE — ED Notes (Signed)
Updated on wait.  Comfort measures provided.

## 2015-08-19 NOTE — ED Provider Notes (Signed)
[place]    CSN: [id number] Arrival date & time: 08/19/15  1312  First Provider Contact:  First MD Initiated Contact with Patient 08/19/15 1535        History   Chief Complaint Chief Complaint  Patient presents with  . Urinary Tract Infection  . Medication Refill    HPI Kayla [name] is a 70 y.o. female.   HPI Patient comes in today with c/o increased urinary incontinence x 1 week and dysuria that started today.  Hx of chronic interstitial cystitis and has been seen by [facility].  Last treated for uti January.  Denies hematuria.  Some nausea today.  No vomiting.  Some abd cramping.  Denies constipation, diarrhea, bloody stools, vaginal discharge. Has had bladder tack procedure and was told that she will likely need another procedure for her incontinence.  Past Medical History:  Diagnosis Date  . Allergy   . Anemia    anemia in the past  . Angina    takes Propanolol prn and it stops her chest  . Anxiety   . Barrett esophagus    not consistently present  . Cataract   . Chronic kidney disease    kidney stones and UTI  . Complication of anesthesia    either  BP or pulse dropped with last surgery  . DDD (degenerative disc disease)    cervical and lumbar  . Dementia   . Depression    post traumatic stress  disorder  . Diabetes mellitus    sugar goes up and down diet controlled  . Dysrhythmia    brought on by stress  . Fatigue   . Fibromyalgia    neuropathy knees ankles and toes, bladder  . GERD (gastroesophageal reflux disease)   . H/O hyperparathyroidism   . Headache   . Headache(784.0)    migraines  . Hiatal hernia   . History of kidney stones   . Hypertension    3 small brain aneurysms  . IBS (irritable bowel syndrome)   . Macular degeneration   . Migraines   . Osteoarthritis    all over  . Osteopetrosis   . Osteoporosis   . Peripheral vascular disease (HCC)    superficial phlebitis in left calf  . Personal history of colonic  polyps 07/22/2013   07/2013 - 3 small adenomas - repeat colonoscopy 2018    Patient Active Problem List   Diagnosis Date Noted  . Vitamin D deficiency 03/08/2015  . H/O gastric bypass 02/11/2014  . Chronic maxillary sinusitis 09/15/2013  . Personal history of colonic polyps 07/22/2013  . HA (headache)-chronic/tension type 07/28/2012  . Osteoporosis, unspecified 07/28/2012  . Unspecified vitamin D deficiency-2009 07/28/2012  . Nephrolithiasis 07/28/2012  . Fibromyalgia 07/26/2012  . Chronic interstitial cystitis 10/16/2011  . Chronic constipation 10/04/2010  . WEIGHT LOSS 11/27/2009  . PERSONAL HISTORY OF FAILED MODERATE SEDATION 06/01/2008  . Anxiety state 03/18/2007  . Reactive depression 03/18/2007  . GERD 03/18/2007  . Diaphragmatic hernia 03/18/2007  . Osteoarthritis 03/18/2007  . [place] DISEASE, CERVICAL 03/18/2007  . [place] DISEASE, LUMBAR 03/18/2007  . Myalgia and myositis 03/18/2007    Past Surgical History:  Procedure Laterality Date  . ABDOMINAL HYSTERECTOMY    . APPENDECTOMY    . CARDIAC CATHETERIZATION  03/22/1991   EF 61%  . CARDIOVASCULAR STRESS TEST  10/03/2006  . CHOLECYSTECTOMY    . COLONOSCOPY  06/23/2008   normal  . DILATION AND CURETTAGE OF UTERUS     x2  .  ESOPHAGUS SURGERY    . EXPLORATORY LAPAROTOMY  1993  . GASTRIC BYPASS  1999  . GASTRIC RESTRICTION SURGERY  1991  . Right Arm Surgery    . Right Knee Arthroscopy    . Rt wrist fx  2009  . stomach stappeling  1991  . STONE EXTRACTION WITH BASKET  2012  . TONSILLECTOMY    . TUBAL LIGATION    . UPPER GASTROINTESTINAL ENDOSCOPY  06/23/2008   w/Dil, Barrett's esophagus  . US ECHOCARDIOGRAPHY  01/21/2007   EF 55-60%  . US ECHOCARDIOGRAPHY  08/30/2003   EF 55-60%    OB History    No data available       Home Medications    Prior to Admission medications   Medication Sig Start Date End Date Taking? Authorizing Provider  Ascorbic Acid (VITAMIN C) 1000 MG tablet Take 1,000 mg by mouth daily.   Yes  Historical Provider, MD  beta carotene w/minerals (OCUVITE) tablet Take 1 tablet by mouth 2 (two) times daily.   Yes Historical Provider, MD  butalbital-acetaminophen-caffeine (FIORICET, ESGIC) 50-325-40 MG tablet Take 1 tablet by mouth every 6 (six) hours as needed for headache. Headache 02/11/15  Yes Jaynee Eagles, PA-C  CALCIUM-MAGNESIUM-ZINC PO Take by mouth daily.   Yes Historical Provider, MD  celecoxib (CELEBREX) 200 MG capsule Take 1 capsule (200 mg total) by mouth daily. 07/08/14  Yes Leandrew Koyanagi, MD  cetirizine (ZYRTEC) 10 MG tablet Take 10 mg by mouth daily.   Yes Historical Provider, MD  Cholecalciferol (VITAMIN D3) 2000 UNITS TABS Take 2,000 Units by mouth daily.   Yes Historical Provider, MD  clonazePAM (KLONOPIN) 1 MG tablet Take 0.5-1 tablets (0.5-1 mg total) by mouth 2 (two) times daily as needed. anxiety/panic attacks 02/24/15  Yes Leandrew Koyanagi, MD  Cyanocobalamin (VITAMIN B-12) 1000 MCG SUBL Place 5,000 mcg under the tongue daily.    Yes Historical Provider, MD  cyclobenzaprine (FLEXERIL) 10 MG tablet Take 1 tablet (10 mg total) by mouth 3 (three) times daily as needed for muscle spasms. 01/16/15  Yes Leandrew Koyanagi, MD  diclofenac sodium (VOLTAREN) 1 % GEL Apply 2 g topically 4 (four) times daily. 05/20/15  Yes Leandrew Koyanagi, MD  dicyclomine (BENTYL) 10 MG capsule Take 1-2 tab 3 times daily AC as needed for spasms and cramping. 11/23/14  Yes Leandrew Koyanagi, MD  diltiazem 2 % GEL Apply 1 application topically 2 (two) times daily. 02/11/14  Yes Mancel Bale, PA-C  docusate sodium (COLACE) 250 MG capsule Take 250 mg by mouth daily.   Yes Historical Provider, MD  DULoxetine (CYMBALTA) 30 MG capsule Take 1 capsule (30 mg total) by mouth daily. 05/20/15  Yes Leandrew Koyanagi, MD  esomeprazole (NEXIUM) 40 MG capsule Take 1 capsule (40 mg total) by mouth daily before breakfast. 05/20/15  Yes Leandrew Koyanagi, MD  fentaNYL (DURAGESIC - DOSED MCG/HR) 75 MCG/HR Place 1 patch  onto the skin every 3 (three) days.   Yes Historical Provider, MD  gabapentin (NEURONTIN) 300 MG capsule Take 1 capsule (300 mg total) by mouth 3 (three) times daily. 05/20/15  Yes Leandrew Koyanagi, MD  hypromellose (SYSTANE OVERNIGHT THERAPY) 0.3 % GEL ophthalmic ointment Place 1 drop into both eyes at bedtime. Reported on 02/11/2015   Yes Historical Provider, MD  lidocaine (LIDODERM) 5 % Place 1 patch onto the skin as needed. Remove & Discard patch within 12 hours. May dispense as 3 month supply 05/06/14  Yes Robert P  [name], MD  Loteprednol Etabonate (LOTEMAX) 0.5 % GEL Place 1 drop into both eyes 2 (two) times daily. Reported on 02/11/2015 07/21/13  Yes Historical Provider, MD  Omega-3 Fatty Acids (SEA-OMEGA PO) Take 1,000 mg by mouth daily.   Yes Historical Provider, MD  polyethylene glycol powder (GLYCOLAX/MIRALAX) powder Take 17 g by mouth 2 (two) times daily. Reported on 03/08/2015 05/20/15  Yes Leandrew Koyanagi, MD  propranolol (INDERAL) 10 MG tablet Take 1 tablet (10 mg total) by mouth 4 (four) times daily. 05/20/15  Yes Leandrew Koyanagi, MD  pyridoxine (B-6) 200 MG tablet Take 200 mg by mouth daily.   Yes Historical Provider, MD  solifenacin (VESICARE) 5 MG tablet Take 1 tablet (5 mg total) by mouth daily. 05/20/15  Yes Leandrew Koyanagi, MD  sucralfate (CARAFATE) 1 GM/10ML suspension Take 10 mLs (1 g total) by mouth 4 (four) times daily -  with meals and at bedtime. 11/04/14  Yes Leandrew Koyanagi, MD  SUMAtriptan (IMITREX) 100 MG tablet Take 1 tablet (100 mg total) by mouth every 3 (three) days as needed for migraine. Headache 05/20/15  Yes Leandrew Koyanagi, MD  traZODone (DESYREL) 150 MG tablet Take 1 tablet (150 mg total) by mouth at bedtime. And 1/2 -1 if wakes at 4am. 07/08/14  Yes Leandrew Koyanagi, MD  triamcinolone (NASACORT) 55 MCG/ACT AERO nasal inhaler Place 2 sprays into the nose daily. 05/20/15  Yes Leandrew Koyanagi, MD  cephALEXin (KEFLEX) 500 MG capsule Take 1 capsule (500 mg  total) by mouth 2 (two) times daily. 08/19/15   Lanae Crumbly, PA-C  fluconazole (DIFLUCAN) 150 MG tablet Take 1 tablet (150 mg total) by mouth once. Repeat in 72 hours if needed 02/20/15   Jaynee Eagles, PA-C  fluorouracil (EFUDEX) 5 % cream Apply 1 application topically 2 (two) times daily. Reported on 03/08/2015 02/11/14   Historical Provider, MD  HYDROcodone-acetaminophen (NORCO) 7.5-325 MG per tablet Take 1 tablet by mouth every 6 (six) hours as needed. Pain     Historical Provider, MD  ketoconazole (NIZORAL) 2 % cream Apply 1 application topically daily. Under breasts Patient not taking: Reported on 02/11/2015 05/06/14   Leandrew Koyanagi, MD  linaclotide [facility]) 145 MCG CAPS capsule Take 1 capsule (145 mcg total) by mouth daily. 05/20/15   Leandrew Koyanagi, MD  zoster vaccine live, PF, (ZOSTAVAX) 16109 UNT/0.65ML injection Inject 19,400 Units into the skin once. Administer at pharmacy Patient not taking: Reported on 03/08/2015 09/05/14   Leandrew Koyanagi, MD    Family History Family History  Problem Relation Age of Onset  . Stroke Mother   . Lung cancer Mother   . Heart disease Mother   . Diabetes Mother   . Hypertension Father   . Stroke Father   . Lung cancer Father   . Heart disease Father   . Kidney disease Father   . Seizures Father     epilepsy, and sisters x 2  . Pancreatic cancer Sister   . Lung cancer Sister   . Colon cancer Maternal Aunt     12 relatives  . Uterine cancer      aunt  . Heart disease      grandmother  . Clotting disorder Sister   . Heart disease Sister     x 2  . Kidney disease Sister     x 2  . Breast cancer Sister   . Colon cancer Paternal Aunt   . Colon cancer Paternal Aunt  Social History Social History  Substance Use Topics  . Smoking status: Never Smoker  . Smokeless tobacco: Never Used  . Alcohol use No     Allergies   Ambien [zolpidem tartrate]; Codeine; Oxycodone-acetaminophen; Tylox [oxycodone-acetaminophen]; Darvocet  [propoxyphene n-acetaminophen]; Darvon [propoxyphene]; Food; Lorcet [hydrocodone-acetaminophen]; Opium; Wheat bran; Zolpidem tartrate; Zolpidem tartrate; Amoxicillin; and Penicillins   Review of Systems Review of Systems  Constitutional: Negative.   HENT: Negative.   Respiratory: Negative.   Cardiovascular: Negative.   Gastrointestinal: Negative.   Genitourinary: Positive for dysuria, pelvic pain and urgency. Negative for vaginal bleeding, vaginal discharge and vaginal pain.  Musculoskeletal: Negative.   Skin: Negative.   Neurological: Negative.   Hematological: Negative.   Psychiatric/Behavioral: Negative.      Physical Exam Triage Vital Signs ED Triage Vitals  Enc Vitals Group     BP 08/19/15 1451 106/69     Pulse Rate 08/19/15 1451 77     Resp 08/19/15 1451 18     Temp 08/19/15 1451 97.9 F (36.6 C)     Temp Source 08/19/15 1451 Oral     SpO2 08/19/15 1451 97 %     Weight --      Height --      Head Circumference --      Peak Flow --      Pain Score 08/19/15 1453 4     Pain Loc --      Pain Edu? --      Excl. in [place]? --    No data found.   Updated Vital Signs BP 106/69 (BP Location: Right Arm)   Pulse 77   Temp 97.9 F (36.6 C) (Oral)   Resp 18   SpO2 97%   Visual Acuity Right Eye Distance:   Left Eye Distance:   Bilateral Distance:    Right Eye Near:   Left Eye Near:    Bilateral Near:     Physical Exam  Constitutional: She is oriented to person, place, and time. No distress.  HENT:  Head: Normocephalic and atraumatic.  Eyes: EOM are normal.  Neck: Normal range of motion.  Cardiovascular: Normal rate.   Pulmonary/Chest: Effort normal and breath sounds normal.  Abdominal: Soft. Bowel sounds are normal. She exhibits no distension. There is no tenderness.  Musculoskeletal: Normal range of motion.  Neurological: She is alert and oriented to person, place, and time.  Skin: Skin is warm and dry.  Psychiatric: She has a normal mood and affect.      UC Treatments / Results  Labs (all labs ordered are listed, but only abnormal results are displayed) Labs Reviewed  POCT URINALYSIS DIP (DEVICE) - Abnormal; Notable for the following:       Result Value   Glucose, UA 500 (*)    Bilirubin Urine MODERATE (*)    Ketones, ur 15 (*)    Hgb urine dipstick MODERATE (*)    Protein, ur 100 (*)    Nitrite POSITIVE (*)    Leukocytes, UA LARGE (*)    All other components within normal limits  URINE CULTURE    EKG  EKG Interpretation None       Radiology No results found.  Procedures Procedures (including critical care time)  Medications Ordered in UC Medications - No data to display   Initial Impression / Assessment and Plan / UC Course  I have reviewed the triage vital signs and the nursing notes.  Pertinent labs & imaging results that were available during my care of the  patient were reviewed by me and considered in my medical decision making (see chart for details).  Clinical Course     Final Clinical Impressions(s) / UC Diagnoses   Final diagnoses:  Acute cystitis without hematuria    New Prescriptions New Prescriptions   CEPHALEXIN (KEFLEX) 500 MG CAPSULE    Take 1 capsule (500 mg total) by mouth 2 (two) times daily.  treatment discussed with patient.  Stressed to her that she needs to follow up with [facility] as we discussed today.  This was also recommended by Dr Sonia Baller several months ago.  All questions answered.     Lanae Crumbly, PA-C 08/19/15 1616

## 2015-08-19 NOTE — ED Triage Notes (Signed)
Also requesting multiple med refills.

## 2015-08-19 NOTE — ED Triage Notes (Signed)
C/O dysuria x 2 wks.  Now having bilat flank pain, abd bloating.  Unsure if fevers.  Has been taking AZO.

## 2015-08-21 LAB — URINE CULTURE

## 2015-09-19 ENCOUNTER — Telehealth: Payer: Self-pay

## 2015-09-19 NOTE — Telephone Encounter (Signed)
Patient request a referral to [facility]. Patient also need a referral to Dr.Zachery Tessa Lerner at pain management. Patient also need a refill on all medications. Butalbital-Acetamiopen 50-325, Clonzepam 1 MG, Voltaren 1%, Bentyl 1 MG, Vocusate Sodium 200 MG, dULOXEXINE. Nexum 40 MG, Neurontin 300 MG, Lidocaine 5%, Trazodone 150 MG

## 2015-09-20 NOTE — Telephone Encounter (Signed)
The last time this patient was seen was 2/27.  Do you want her to RTC?

## 2015-09-20 NOTE — Telephone Encounter (Signed)
Please review this message with her in detail. She does need to come in.  Please advise that Dr. Naaman Plummer will most likely not accept her case if she is taking Klonopin due to the interaction with opioids and the risk of respiratory depression.  This will need to be stopped.  We can give her those referrals and all of her other meds, however per Dr. Ninfa Meeker last note she was going to seek care elsewhere after he retired. Philis Fendt, MS, PA-C 1:12 PM, 09/20/2015

## 2015-09-21 ENCOUNTER — Ambulatory Visit (HOSPITAL_COMMUNITY)
Admission: EM | Admit: 2015-09-21 | Discharge: 2015-09-21 | Disposition: A | Discharge status: Home / Self Care | Payer: Medicare Other | Attending: Internal Medicine | Admitting: Internal Medicine

## 2015-09-21 ENCOUNTER — Telehealth: Payer: Self-pay | Admitting: Emergency Medicine

## 2015-09-21 ENCOUNTER — Encounter (HOSPITAL_COMMUNITY): Payer: Self-pay | Admitting: Emergency Medicine

## 2015-09-21 DIAGNOSIS — N390 Urinary tract infection, site not specified: Secondary | ICD-10-CM | POA: Diagnosis not present

## 2015-09-21 LAB — POCT URINALYSIS DIP (DEVICE)
GLUCOSE, UA: 250 mg/dL — AB
Nitrite: POSITIVE — AB
PROTEIN: 100 mg/dL — AB
SPECIFIC GRAVITY, URINE: 1.02 (ref 1.005–1.030)
Urobilinogen, UA: 4 mg/dL — ABNORMAL HIGH (ref 0.0–1.0)
pH: 5 (ref 5.0–8.0)

## 2015-09-21 MED ORDER — SULFAMETHOXAZOLE-TRIMETHOPRIM 800-160 MG PO TABS: 1.0000 | ORAL_TABLET | Freq: Two times a day (BID) | ORAL | 0 refills | Status: AC

## 2015-09-21 NOTE — Discharge Instructions (Signed)
Recommend start Bactrim (antibiotic) twice a day for 10 days. We sent your urine for a culture and will notify you regarding culture results. Please follow-up with your Urologist for further evaluation.

## 2015-09-21 NOTE — ED Notes (Signed)
Provided warm blankets and readjusted thermostat in treatment room

## 2015-09-21 NOTE — Telephone Encounter (Signed)
Left message to return call regarding med refill

## 2015-09-21 NOTE — ED Triage Notes (Signed)
receintly treated for uti 8/5.  Reports symptoms reoccurred last night.  Getting up multiple times during the night.  Urgency, frequency and burning.  Patient reviewed paper work from previous visit and saw where she needed to contact urology.  Patient has her pcp working on this-getting an appointment

## 2015-09-22 LAB — URINE CULTURE
Culture: NO GROWTH
Special Requests: NORMAL

## 2015-09-22 NOTE — ED Provider Notes (Signed)
CSN: [id number]     Arrival date & time 09/21/15  1253 History   First MD Initiated Contact with Patient 09/21/15 1423     Chief Complaint  Patient presents with  . Urinary Tract Infection   (Consider location/radiation/quality/duration/timing/severity/associated sxs/prior Treatment) 70 year old female presents with dysuria, frequency, urgency and incontinence that started last evening. She denies any fever, nausea or vomiting. She does have abdominal pain but has chronic gastrointestinal issues with occasional anal leakage and urinary incontinence that can lead to frequent UTI's. Her last UTI was about 1 month ago. She was seen here and placed on Keflex. Her urine culture grew E.coli and was resistant to Cipro, Ampicillin and Nitrofurantoin but sensitive to Bactrim and Rocephin. Her symptoms completely resolved with Keflex but returned last night. She is in the process of contacting her Urologist for an appointment for further evaluation.       Past Medical History:  Diagnosis Date  . Allergy   . Anemia    anemia in the past  . Angina    takes Propanolol prn and it stops her chest  . Anxiety   . Barrett esophagus    not consistently present  . Cataract   . Chronic kidney disease    kidney stones and UTI  . Complication of anesthesia    either  BP or pulse dropped with last surgery  . DDD (degenerative disc disease)    cervical and lumbar  . Dementia   . Depression    post traumatic stress  disorder  . Diabetes mellitus    sugar goes up and down diet controlled  . Dysrhythmia    brought on by stress  . Fatigue   . Fibromyalgia    neuropathy knees ankles and toes, bladder  . GERD (gastroesophageal reflux disease)   . H/O hyperparathyroidism   . Headache   . Headache(784.0)    migraines  . Hiatal hernia   . History of kidney stones   . Hypertension    3 small brain aneurysms  . IBS (irritable bowel syndrome)   . Macular degeneration   . Migraines   . Osteoarthritis     all over  . Osteopetrosis   . Osteoporosis   . Peripheral vascular disease (HCC)    superficial phlebitis in left calf  . Personal history of colonic polyps 07/22/2013   07/2013 - 3 small adenomas - repeat colonoscopy 2018   Past Surgical History:  Procedure Laterality Date  . ABDOMINAL HYSTERECTOMY    . APPENDECTOMY    . CARDIAC CATHETERIZATION  03/22/1991   EF 61%  . CARDIOVASCULAR STRESS TEST  10/03/2006  . CHOLECYSTECTOMY    . COLONOSCOPY  06/23/2008   normal  . DILATION AND CURETTAGE OF UTERUS     x2  . ESOPHAGUS SURGERY    . EXPLORATORY LAPAROTOMY  1993  . GASTRIC BYPASS  1999  . GASTRIC RESTRICTION SURGERY  1991  . Right Arm Surgery    . Right Knee Arthroscopy    . Rt wrist fx  2009  . stomach stappeling  1991  . STONE EXTRACTION WITH BASKET  2012  . TONSILLECTOMY    . TUBAL LIGATION    . UPPER GASTROINTESTINAL ENDOSCOPY  06/23/2008   w/Dil, Barrett's esophagus  . US ECHOCARDIOGRAPHY  01/21/2007   EF 55-60%  . US ECHOCARDIOGRAPHY  08/30/2003   EF 55-60%   Family History  Problem Relation Age of Onset  . Stroke Mother   . Lung cancer Mother   .  Heart disease Mother   . Diabetes Mother   . Hypertension Father   . Stroke Father   . Lung cancer Father   . Heart disease Father   . Kidney disease Father   . Seizures Father     epilepsy, and sisters x 2  . Pancreatic cancer Sister   . Lung cancer Sister   . Colon cancer Maternal Aunt     12 relatives  . Uterine cancer      aunt  . Heart disease      grandmother  . Clotting disorder Sister   . Heart disease Sister     x 2  . Kidney disease Sister     x 2  . Breast cancer Sister   . Colon cancer Paternal Aunt   . Colon cancer Paternal Aunt    Social History  Substance Use Topics  . Smoking status: Never Smoker  . Smokeless tobacco: Never Used  . Alcohol use No   OB History    No data available     Review of Systems  Constitutional: Positive for fatigue. Negative for chills and fever.   Gastrointestinal: Positive for abdominal pain (lower ). Negative for nausea and vomiting. Diarrhea: intermittent.  Genitourinary: Positive for decreased urine volume, dysuria, frequency and urgency. Negative for flank pain, hematuria, pelvic pain, vaginal discharge and vaginal pain.  Neurological: Negative for headaches.    Allergies  Ambien [zolpidem tartrate]; Codeine; Oxycodone-acetaminophen; Tylox [oxycodone-acetaminophen]; Darvocet [propoxyphene n-acetaminophen]; Darvon [propoxyphene]; Food; Lorcet [hydrocodone-acetaminophen]; Opium; Wheat bran; Zolpidem tartrate; Zolpidem tartrate; Amoxicillin; and Penicillins  Home Medications   Prior to Admission medications   Medication Sig Start Date End Date Taking? Authorizing Provider  Ascorbic Acid (VITAMIN C) 1000 MG tablet Take 1,000 mg by mouth daily.    Historical Provider, MD  beta carotene w/minerals (OCUVITE) tablet Take 1 tablet by mouth 2 (two) times daily.    Historical Provider, MD  butalbital-acetaminophen-caffeine (FIORICET, ESGIC) 50-325-40 MG tablet Take 1 tablet by mouth every 6 (six) hours as needed for headache. Headache 02/11/15   Jaynee Eagles, PA-C  CALCIUM-MAGNESIUM-ZINC PO Take by mouth daily.    Historical Provider, MD  celecoxib (CELEBREX) 200 MG capsule Take 1 capsule (200 mg total) by mouth daily. 07/08/14   Leandrew Koyanagi, MD  cetirizine (ZYRTEC) 10 MG tablet Take 10 mg by mouth daily.    Historical Provider, MD  Cholecalciferol (VITAMIN D3) 2000 UNITS TABS Take 2,000 Units by mouth daily.    Historical Provider, MD  clonazePAM (KLONOPIN) 1 MG tablet Take 0.5-1 tablets (0.5-1 mg total) by mouth 2 (two) times daily as needed. anxiety/panic attacks 02/24/15   Leandrew Koyanagi, MD  Cyanocobalamin (VITAMIN B-12) 1000 MCG SUBL Place 5,000 mcg under the tongue daily.     Historical Provider, MD  cyclobenzaprine (FLEXERIL) 10 MG tablet Take 1 tablet (10 mg total) by mouth 3 (three) times daily as needed for muscle spasms.  01/16/15   Leandrew Koyanagi, MD  diclofenac sodium (VOLTAREN) 1 % GEL Apply 2 g topically 4 (four) times daily. 05/20/15   Leandrew Koyanagi, MD  dicyclomine (BENTYL) 10 MG capsule Take 1-2 tab 3 times daily AC as needed for spasms and cramping. 11/23/14   Leandrew Koyanagi, MD  diltiazem 2 % GEL Apply 1 application topically 2 (two) times daily. 02/11/14   Mancel Bale, PA-C  docusate sodium (COLACE) 250 MG capsule Take 250 mg by mouth daily.    Historical Provider, MD  DULoxetine (CYMBALTA) 30  MG capsule Take 1 capsule (30 mg total) by mouth daily. 05/20/15   Leandrew Koyanagi, MD  esomeprazole (NEXIUM) 40 MG capsule Take 1 capsule (40 mg total) by mouth daily before breakfast. 05/20/15   Leandrew Koyanagi, MD  fentaNYL (DURAGESIC - DOSED MCG/HR) 75 MCG/HR Place 1 patch onto the skin every 3 (three) days.    Historical Provider, MD  fluconazole (DIFLUCAN) 150 MG tablet Take 1 tablet (150 mg total) by mouth once. Repeat in 72 hours if needed 02/20/15   Jaynee Eagles, PA-C  fluorouracil (EFUDEX) 5 % cream Apply 1 application topically 2 (two) times daily. Reported on 03/08/2015 02/11/14   Historical Provider, MD  gabapentin (NEURONTIN) 300 MG capsule Take 1 capsule (300 mg total) by mouth 3 (three) times daily. 05/20/15   Leandrew Koyanagi, MD  HYDROcodone-acetaminophen (Maybee) 7.5-325 MG per tablet Take 1 tablet by mouth every 6 (six) hours as needed. Pain     Historical Provider, MD  hypromellose (SYSTANE OVERNIGHT THERAPY) 0.3 % GEL ophthalmic ointment Place 1 drop into both eyes at bedtime. Reported on 02/11/2015    Historical Provider, MD  ketoconazole (NIZORAL) 2 % cream Apply 1 application topically daily. Under breasts Patient not taking: Reported on 02/11/2015 05/06/14   Leandrew Koyanagi, MD  lidocaine (LIDODERM) 5 % Place 1 patch onto the skin as needed. Remove & Discard patch within 12 hours. May dispense as 3 month supply 05/06/14   Leandrew Koyanagi, MD  linaclotide [facility]) 145 MCG CAPS capsule  Take 1 capsule (145 mcg total) by mouth daily. 05/20/15   Leandrew Koyanagi, MD  Loteprednol Etabonate (LOTEMAX) 0.5 % GEL Place 1 drop into both eyes 2 (two) times daily. Reported on 02/11/2015 07/21/13   Historical Provider, MD  Omega-3 Fatty Acids (SEA-OMEGA PO) Take 1,000 mg by mouth daily.    Historical Provider, MD  polyethylene glycol powder (GLYCOLAX/MIRALAX) powder Take 17 g by mouth 2 (two) times daily. Reported on 03/08/2015 05/20/15   Leandrew Koyanagi, MD  propranolol (INDERAL) 10 MG tablet Take 1 tablet (10 mg total) by mouth 4 (four) times daily. 05/20/15   Leandrew Koyanagi, MD  pyridoxine (B-6) 200 MG tablet Take 200 mg by mouth daily.    Historical Provider, MD  solifenacin (VESICARE) 5 MG tablet Take 1 tablet (5 mg total) by mouth daily. 05/20/15   Leandrew Koyanagi, MD  sucralfate (CARAFATE) 1 GM/10ML suspension Take 10 mLs (1 g total) by mouth 4 (four) times daily -  with meals and at bedtime. 11/04/14   Leandrew Koyanagi, MD  sulfamethoxazole-trimethoprim (BACTRIM DS,SEPTRA DS) 800-160 MG tablet Take 1 tablet by mouth 2 (two) times daily. 09/21/15 10/01/15  Katy Apo, NP  SUMAtriptan (IMITREX) 100 MG tablet Take 1 tablet (100 mg total) by mouth every 3 (three) days as needed for migraine. Headache 05/20/15   Leandrew Koyanagi, MD  traZODone (DESYREL) 150 MG tablet Take 1 tablet (150 mg total) by mouth at bedtime. And 1/2 -1 if wakes at 4am. 07/08/14   Leandrew Koyanagi, MD  triamcinolone (NASACORT) 55 MCG/ACT AERO nasal inhaler Place 2 sprays into the nose daily. 05/20/15   Leandrew Koyanagi, MD  zoster vaccine live, PF, (ZOSTAVAX) 25852 UNT/0.65ML injection Inject 19,400 Units into the skin once. Administer at pharmacy Patient not taking: Reported on 03/08/2015 09/05/14   Leandrew Koyanagi, MD   Meds Ordered and Administered this Visit  Medications - No data to display  BP 104/62 (BP  Location: Left Arm)   Pulse 76   Temp 97.9 F (36.6 C) (Oral)   Resp 14   SpO2 98%  No data  found.   Physical Exam  Constitutional: She is oriented to person, place, and time. She appears well-developed. She appears cachectic. She is cooperative. No distress.  Cardiovascular: Normal rate, regular rhythm and normal heart sounds.   Pulmonary/Chest: Effort normal and breath sounds normal.  Abdominal: Soft. Bowel sounds are normal. She exhibits no mass. There is tenderness in the suprapubic area. There is no rigidity, no rebound, no guarding and no CVA tenderness.  Neurological: She is alert and oriented to person, place, and time.  Skin: Skin is warm and dry. Capillary refill takes less than 2 seconds.  Psychiatric: She has a normal mood and affect. Her speech is normal and behavior is normal. Judgment and thought content normal.    Urgent Care Course   Clinical Course    Procedures (including critical care time)  Labs Review Labs Reviewed  POCT URINALYSIS DIP (DEVICE) - Abnormal; Notable for the following:       Result Value   Glucose, UA 250 (*)    Bilirubin Urine SMALL (*)    Ketones, ur TRACE (*)    Hgb urine dipstick LARGE (*)    Protein, ur 100 (*)    Urobilinogen, UA 4.0 (*)    Nitrite POSITIVE (*)    Leukocytes, UA LARGE (*)    All other components within normal limits  URINE CULTURE    Imaging Review No results found.   Visual Acuity Review  Right Eye Distance:   Left Eye Distance:   Bilateral Distance:    Right Eye Near:   Left Eye Near:    Bilateral Near:         MDM   1. UTI (lower urinary tract infection)    Reviewed current urinalysis results which support a dx of UTI. Discussed previous urine culture results 1 month ago. Recommend trial Bactrim DS twice a day for 10 days. Urine sent for culture. Discussed that patient needs to follow-up with her Urologist for further evaluation, especially due to her other urinary issues. Will notify patient regarding urine culture results. Follow up as planned with her Urologist.     Katy Apo,  NP 09/22/15 [phone]

## 2015-09-22 NOTE — Telephone Encounter (Signed)
Pt CB and I gave her message. She advised that she really doesn't want to change practices and agreed to RTC to est care w/new provider and discuss meds and referrals. Transferred to operator to sch appt.

## 2015-09-27 ENCOUNTER — Telehealth: Payer: Self-pay | Admitting: *Deleted

## 2015-09-27 ENCOUNTER — Ambulatory Visit (INDEPENDENT_AMBULATORY_CARE_PROVIDER_SITE_OTHER): Payer: Medicare Other | Admitting: Family Medicine

## 2015-09-27 VITALS — BP 112/64 | HR 77 | Temp 98.0°F | Resp 18 | Ht 65.5 in | Wt 108.2 lb

## 2015-09-27 DIAGNOSIS — F329 Major depressive disorder, single episode, unspecified: Secondary | ICD-10-CM | POA: Diagnosis not present

## 2015-09-27 DIAGNOSIS — M5137 Other intervertebral disc degeneration, lumbosacral region: Secondary | ICD-10-CM

## 2015-09-27 DIAGNOSIS — M810 Age-related osteoporosis without current pathological fracture: Secondary | ICD-10-CM

## 2015-09-27 DIAGNOSIS — K319 Disease of stomach and duodenum, unspecified: Secondary | ICD-10-CM

## 2015-09-27 DIAGNOSIS — K2100 Gastro-esophageal reflux disease with esophagitis, without bleeding: Secondary | ICD-10-CM

## 2015-09-27 DIAGNOSIS — R51 Headache: Secondary | ICD-10-CM | POA: Diagnosis not present

## 2015-09-27 DIAGNOSIS — K449 Diaphragmatic hernia without obstruction or gangrene: Secondary | ICD-10-CM

## 2015-09-27 DIAGNOSIS — M545 Low back pain, unspecified: Secondary | ICD-10-CM

## 2015-09-27 DIAGNOSIS — Z23 Encounter for immunization: Secondary | ICD-10-CM

## 2015-09-27 DIAGNOSIS — K3189 Other diseases of stomach and duodenum: Secondary | ICD-10-CM

## 2015-09-27 DIAGNOSIS — R519 Headache, unspecified: Secondary | ICD-10-CM

## 2015-09-27 DIAGNOSIS — N390 Urinary tract infection, site not specified: Secondary | ICD-10-CM

## 2015-09-27 DIAGNOSIS — F411 Generalized anxiety disorder: Secondary | ICD-10-CM | POA: Diagnosis not present

## 2015-09-27 DIAGNOSIS — M797 Fibromyalgia: Secondary | ICD-10-CM

## 2015-09-27 LAB — POCT URINALYSIS DIP (MANUAL ENTRY)
Bilirubin, UA: NEGATIVE
Blood, UA: NEGATIVE
GLUCOSE UA: NEGATIVE
Ketones, POC UA: NEGATIVE
LEUKOCYTES UA: NEGATIVE
NITRITE UA: NEGATIVE
PROTEIN UA: NEGATIVE
SPEC GRAV UA: 1.015
UROBILINOGEN UA: 1
pH, UA: 6

## 2015-09-27 LAB — POC MICROSCOPIC URINALYSIS (UMFC): MUCUS RE: ABSENT

## 2015-09-27 MED ORDER — DULOXETINE HCL 60 MG PO CPEP: 60.0000 mg | ORAL_CAPSULE | Freq: Every day | ORAL | 1 refills | Status: DC

## 2015-09-27 MED ORDER — DICYCLOMINE HCL 10 MG PO CAPS: ORAL_CAPSULE | ORAL | 1 refills | Status: DC

## 2015-09-27 MED ORDER — CELECOXIB 200 MG PO CAPS: 200.0000 mg | ORAL_CAPSULE | Freq: Every day | ORAL | 1 refills | Status: DC | PRN

## 2015-09-27 MED ORDER — TRAZODONE HCL 100 MG PO TABS: 50.0000 mg | ORAL_TABLET | Freq: Every evening | ORAL | 1 refills | Status: DC | PRN

## 2015-09-27 MED ORDER — BUTALBITAL-APAP-CAFFEINE 50-325-40 MG PO TABS: 1.0000 | ORAL_TABLET | Freq: Four times a day (QID) | ORAL | 0 refills | Status: DC | PRN

## 2015-09-27 MED ORDER — ESOMEPRAZOLE MAGNESIUM 40 MG PO CPDR: 40.0000 mg | DELAYED_RELEASE_CAPSULE | Freq: Every day | ORAL | 1 refills | Status: DC

## 2015-09-27 MED ORDER — DULOXETINE HCL 30 MG PO CPEP: 30.0000 mg | ORAL_CAPSULE | Freq: Every day | ORAL | 1 refills | Status: DC

## 2015-09-27 MED ORDER — CLONAZEPAM 1 MG PO TABS: 0.5000 mg | ORAL_TABLET | Freq: Two times a day (BID) | ORAL | 1 refills | Status: DC | PRN

## 2015-09-27 MED ORDER — CYCLOBENZAPRINE HCL 10 MG PO TABS: 10.0000 mg | ORAL_TABLET | Freq: Three times a day (TID) | ORAL | 1 refills | Status: DC | PRN

## 2015-09-27 MED ORDER — LIDOCAINE 5 % EX PTCH: 1.0000 | MEDICATED_PATCH | CUTANEOUS | 3 refills | Status: DC | PRN

## 2015-09-27 MED ORDER — GABAPENTIN 300 MG PO CAPS: 300.0000 mg | ORAL_CAPSULE | Freq: Three times a day (TID) | ORAL | 1 refills | Status: DC

## 2015-09-27 MED ORDER — PROPRANOLOL HCL 10 MG PO TABS: 10.0000 mg | ORAL_TABLET | Freq: Four times a day (QID) | ORAL | 1 refills | Status: DC

## 2015-09-27 MED ORDER — DICLOFENAC SODIUM 1 % TD GEL: 2.0000 g | Freq: Four times a day (QID) | TRANSDERMAL | 1 refills | Status: DC

## 2015-09-27 NOTE — Progress Notes (Signed)
By signing my name below I, Kayla [name], attest that this documentation has been prepared under the direction and in the presence of Kayla Cheadle, MD. Electonically Signed. Kayla [name], Scribe 09/27/2015 at 3:28 PM   Subjective:    Patient ID: Kayla [name], female    DOB: 1945/02/23, 70 y.o.   MRN: [id number]  Chief Complaint  Patient presents with  . Medication Refill    all meds     HPI [name] is a 70 y.o. female who presents to the [facility] to establish Kayla Kayla [name] as her PCP. Pt is prior pt of Kayla Kayla [name]. Kayla Kayla [name] has been treating mood disorder with clonopin 0.5 to 1mg  bid. At last visit 7 months ago pt was restarted on zymbalta 30mg  at last visit. Pt was weening herself off trazodone at the time.   Pt reports that she has been seeing a therapist off and on since 1980. Pt states that recently she has had a lot of breakthroughs with her recent therapist at [name]. Pt reports that her anxiety and panic attacks came from working with her daughter due to her daughter having anger management issues. Pt reports a lot of increased stress when her daughter is staying with her. Pt states she was not using her mood medications often until 6 months ago when her daughter moved back in with her. Pt states that she has been weaning herself off of trazodone and only takes 100mg  trazodone. Pt reports that her depression has been from death of siblings and parents in the past and is doing better. Pt states that her marriage is also doing much better after working with Kayla [name]. Pt is on Cymbalta which she would like doubled in order to help control pain.  Seen several times in ED for UTIs. Last visit at [facility] was 7 months prior. Pt reports that she was taking keflex recently for UTI and because her sharp stabbing pain with urination had not cleared, she was reevaluated and then started on a sulfa antibiotic which she is still  taking. Pt has 2 more days of her current antibiotic. Pt states that her dysuria has resolved. Pt also reports having urinary incontinence from history of multiple abd surgeries, pregnancies, urethral dilations, and UTIs. Pt has next appointment with urology October 31st to evaluate incontinence.   Pt has history of anxiety and social isolation and agoraphobia in addition to depression and fibromyalgia and irregular sleep.   Pt sees Kayla. Vilinda [name] for pain management. Pt is being weaned off fentanyl and is using Voltaren gel. Pt  States that Kayla Kayla [name]' office has closed. Pt reports she stopped taking her Vicodin because it was causing CP, so she was put on 50 mcg/hr patches. Pt states she wants to get completely off her fentanyl patches. Was suggested to go to Kayla Kayla [name].   Pt reports she was straining during a BM 6 months ago and had sudden onset of a sharp HA. Pt followed up with neurology at the time and was scheduled for an MRI that she scheduled due to already having had multiple head MRIs. Last MRI of her brain was in 2013. Radiology read is in [place]. Pt stopped going to see Neurology after that. Pt still reports she has intermittent sharp HAs daily. Pt wants a new neurology appointment because she has no showed for neurology appointment. Pt reports that she has been taking Fioricet every 6 hrs to control HAs. Pt reports that  she still has HAs while on the Fioricet. Pt stopped taking the Fioricet for 3 months and HAs were unchanged.   Pt does not take her Celebrex for her neck pain everyday because she is also on flexeril and pt was not sure if they interacted or if they were the same drug.  Pt reports that she also does not take her flexeril everyday because it makes her tired and she is worried about taking to many medications. Flexeril is prescribed for neck pain and fibromyalgia.   Pt is seen by urology for chronic interstitial cystitis kidney stones and recurrent UTIs. Pt follows with neurology  for chronic HAs. And [name] for IBS and takes bentyl to control her symptoms. Pt also takes nexium to control GERD.   Pt sees Kayla [name] for cardiology and is prescribed propranolol to take 4 times daily. Pt states that she only takes it as needed whenever she has episodes of CP which occurs twice a day. Pt has an appointment with Kayla [name] next week.   Pt quit taking the zyrtec and started taking mucinex as needed instead.   Patient Active Problem List   Diagnosis Date Noted  . Vitamin D deficiency 03/08/2015  . H/O gastric bypass 02/11/2014  . Chronic maxillary sinusitis 09/15/2013  . Personal history of colonic polyps 07/22/2013  . HA (headache)-chronic/tension type 07/28/2012  . Osteoporosis, unspecified 07/28/2012  . Unspecified vitamin D deficiency-2009 07/28/2012  . Nephrolithiasis 07/28/2012  . Fibromyalgia 07/26/2012  . Chronic interstitial cystitis 10/16/2011  . Chronic constipation 10/04/2010  . WEIGHT LOSS 11/27/2009  . PERSONAL HISTORY OF FAILED MODERATE SEDATION 06/01/2008  . Anxiety state 03/18/2007  . Reactive depression 03/18/2007  . GERD 03/18/2007  . Diaphragmatic hernia 03/18/2007  . Osteoarthritis 03/18/2007  . [place] DISEASE, CERVICAL 03/18/2007  . [place] DISEASE, LUMBAR 03/18/2007  . Myalgia and myositis 03/18/2007    Current Outpatient Prescriptions on File Prior to Visit  Medication Sig Dispense Refill  . Ascorbic Acid (VITAMIN C) 1000 MG tablet Take 1,000 mg by mouth daily.    . beta carotene w/minerals (OCUVITE) tablet Take 1 tablet by mouth 2 (two) times daily.    . butalbital-acetaminophen-caffeine (FIORICET, ESGIC) 50-325-40 MG tablet Take 1 tablet by mouth every 6 (six) hours as needed for headache. Headache 120 tablet 0  . CALCIUM-MAGNESIUM-ZINC PO Take by mouth daily.    . celecoxib (CELEBREX) 200 MG capsule Take 1 capsule (200 mg total) by mouth daily. 90 capsule 3  . cetirizine (ZYRTEC) 10 MG tablet Take 10 mg by mouth daily.    .  Cholecalciferol (VITAMIN D3) 2000 UNITS TABS Take 2,000 Units by mouth daily.    . clonazePAM (KLONOPIN) 1 MG tablet Take 0.5-1 tablets (0.5-1 mg total) by mouth 2 (two) times daily as needed. anxiety/panic attacks 180 tablet 1  . Cyanocobalamin (VITAMIN B-12) 1000 MCG SUBL Place 5,000 mcg under the tongue daily.     . cyclobenzaprine (FLEXERIL) 10 MG tablet Take 1 tablet (10 mg total) by mouth 3 (three) times daily as needed for muscle spasms. 90 tablet 1  . diclofenac sodium (VOLTAREN) 1 % GEL Apply 2 g topically 4 (four) times daily. 700 g 1  . dicyclomine (BENTYL) 10 MG capsule Take 1-2 tab 3 times daily AC as needed for spasms and cramping. 270 capsule 3  . diltiazem 2 % GEL Apply 1 application topically 2 (two) times daily. 30 g 0  . docusate sodium (COLACE) 250 MG capsule Take 250 mg by  mouth daily.    . DULoxetine (CYMBALTA) 30 MG capsule Take 1 capsule (30 mg total) by mouth daily. 90 capsule 1  . esomeprazole (NEXIUM) 40 MG capsule Take 1 capsule (40 mg total) by mouth daily before breakfast. 90 capsule 1  . fluorouracil (EFUDEX) 5 % cream Apply 1 application topically 2 (two) times daily. Reported on 03/08/2015    . gabapentin (NEURONTIN) 300 MG capsule Take 1 capsule (300 mg total) by mouth 3 (three) times daily. 270 capsule 1  . hypromellose (SYSTANE OVERNIGHT THERAPY) 0.3 % GEL ophthalmic ointment Place 1 drop into both eyes at bedtime. Reported on 02/11/2015    . lidocaine (LIDODERM) 5 % Place 1 patch onto the skin as needed. Remove & Discard patch within 12 hours. May dispense as 3 month supply 90 patch 3  . Loteprednol Etabonate (LOTEMAX) 0.5 % GEL Place 1 drop into both eyes 2 (two) times daily. Reported on 02/11/2015    . Omega-3 Fatty Acids (SEA-OMEGA PO) Take 1,000 mg by mouth daily.    . polyethylene glycol powder (GLYCOLAX/MIRALAX) powder Take 17 g by mouth 2 (two) times daily. Reported on 03/08/2015 850 g 1  . propranolol (INDERAL) 10 MG tablet Take 1 tablet (10 mg total) by  mouth 4 (four) times daily. 360 tablet 1  . pyridoxine (B-6) 200 MG tablet Take 200 mg by mouth daily.    . solifenacin (VESICARE) 5 MG tablet Take 1 tablet (5 mg total) by mouth daily. 90 tablet 1  . sucralfate (CARAFATE) 1 GM/10ML suspension Take 10 mLs (1 g total) by mouth 4 (four) times daily -  with meals and at bedtime. 420 mL 3  . sulfamethoxazole-trimethoprim (BACTRIM DS,SEPTRA DS) 800-160 MG tablet Take 1 tablet by mouth 2 (two) times daily. 20 tablet 0  . SUMAtriptan (IMITREX) 100 MG tablet Take 1 tablet (100 mg total) by mouth every 3 (three) days as needed for migraine. Headache 30 tablet 1  . traZODone (DESYREL) 150 MG tablet Take 1 tablet (150 mg total) by mouth at bedtime. And 1/2 -1 if wakes at 4am. 180 tablet 3  . triamcinolone (NASACORT) 55 MCG/ACT AERO nasal inhaler Place 2 sprays into the nose daily. 3 Inhaler 1  . fentaNYL (DURAGESIC - DOSED MCG/HR) 75 MCG/HR Place 1 patch onto the skin every 3 (three) days.    . fluconazole (DIFLUCAN) 150 MG tablet Take 1 tablet (150 mg total) by mouth once. Repeat in 72 hours if needed (Patient not taking: Reported on 09/27/2015) 2 tablet 0  . HYDROcodone-acetaminophen (NORCO) 7.5-325 MG per tablet Take 1 tablet by mouth every 6 (six) hours as needed. Pain     . ketoconazole (NIZORAL) 2 % cream Apply 1 application topically daily. Under breasts (Patient not taking: Reported on 09/27/2015) 15 g 0  . linaclotide (LINZESS) 145 MCG CAPS capsule Take 1 capsule (145 mcg total) by mouth daily. (Patient not taking: Reported on 09/27/2015) 90 capsule 1  . zoster vaccine live, PF, (ZOSTAVAX) 85277 UNT/0.65ML injection Inject 19,400 Units into the skin once. Administer at pharmacy (Patient not taking: Reported on 09/27/2015) 1 each 0  . [DISCONTINUED] buPROPion (WELLBUTRIN) 75 MG tablet Take 75 mg by mouth daily after breakfast.      No current facility-administered medications on file prior to visit.     Allergies  Allergen Reactions  . Ambien  [Zolpidem Tartrate]     Hallucinations  . Codeine Anaphylaxis  . Oxycodone-Acetaminophen Shortness Of Breath  . Tylox [Oxycodone-Acetaminophen] Anaphylaxis  Chest pain  . Darvocet [Propoxyphene N-Acetaminophen]     Chest pain  . Darvon [Propoxyphene] Itching  . Food Swelling    Peanuts-wheat-  . Lorcet [Hydrocodone-Acetaminophen]     Chest pain  . Opium     hallucinations  . Wheat Bran   . Zolpidem Tartrate Other (See Comments)    Makes her go crazy  . Zolpidem Tartrate     hallucinations  . Amoxicillin Nausea And Vomiting    Reaction:  Migraine headache  . Penicillins Nausea And Vomiting    Migraine Headaches    Depression screen [facility] 2/9 09/27/2015 03/08/2015 02/11/2015 11/23/2014 09/05/2014  Decreased Interest 0 3 0 0 0  Down, Depressed, Hopeless 0 3 0 0 0  PHQ - 2 Score 0 6 0 0 0  Altered sleeping - 3 - - -  Tired, decreased energy - 3 - - -  Change in appetite - 3 - - -  Feeling bad or failure about yourself  - 3 - - -  Trouble concentrating - 3 - - -  Moving slowly or fidgety/restless - 3 - - -  Suicidal thoughts - 0 - - -  PHQ-9 Score - 24 - - -  Difficult doing work/chores - Very difficult - - -  Some recent data might be hidden    Past Medical History:  Diagnosis Date  . Allergy   . Anemia    anemia in the past  . Angina    takes Propanolol prn and it stops her chest  . Anxiety   . Barrett esophagus    not consistently present  . Cataract   . Chronic kidney disease    kidney stones and UTI  . Complication of anesthesia    either  BP or [name] dropped with last surgery  . DDD (degenerative disc disease)    cervical and lumbar  . Dementia   . Depression    post traumatic stress  disorder  . Diabetes mellitus    sugar goes up and down diet controlled  . Dysrhythmia    brought on by stress  . Fatigue   . Fibromyalgia    neuropathy knees ankles and toes, bladder  . GERD (gastroesophageal reflux disease)   . H/O hyperparathyroidism   . Headache     . Headache(784.0)    migraines  . Hiatal hernia   . History of kidney stones   . Hypertension    3 small brain aneurysms  . IBS (irritable bowel syndrome)   . Macular degeneration   . Migraines   . Osteoarthritis    all over  . Osteopetrosis   . Osteoporosis   . Peripheral vascular disease (HCC)    superficial phlebitis in left calf  . Personal history of colonic polyps 07/22/2013   07/2013 - 3 small adenomas - repeat colonoscopy 2018   Past Surgical History:  Procedure Laterality Date  . ABDOMINAL HYSTERECTOMY    . APPENDECTOMY    . CARDIAC CATHETERIZATION  03/22/1991   EF 61%  . CARDIOVASCULAR STRESS TEST  10/03/2006  . CHOLECYSTECTOMY    . COLONOSCOPY  06/23/2008   normal  . DILATION AND CURETTAGE OF UTERUS     x2  . ESOPHAGUS SURGERY    . EXPLORATORY LAPAROTOMY  1993  . GASTRIC BYPASS  1999  . GASTRIC RESTRICTION SURGERY  1991  . Right Arm Surgery    . Right Knee Arthroscopy    . Rt wrist fx  2009  . stomach stappeling  [place] EXTRACTION WITH BASKET  2012  . TONSILLECTOMY    . TUBAL LIGATION    . UPPER GASTROINTESTINAL ENDOSCOPY  06/23/2008   w/Dil, Barrett's esophagus  . US ECHOCARDIOGRAPHY  01/21/2007   EF 55-60%  . US ECHOCARDIOGRAPHY  08/30/2003   EF 55-60%   Family History  Problem Relation Age of Onset  . Stroke Mother   . Lung cancer Mother   . Heart disease Mother   . Diabetes Mother   . Hypertension Father   . Stroke Father   . Lung cancer Father   . Heart disease Father   . Kidney disease Father   . Seizures Father     epilepsy, and sisters x 2  . Pancreatic cancer Sister   . Lung cancer Sister   . Colon cancer Maternal Aunt     12 relatives  . Uterine cancer      aunt  . Heart disease      grandmother  . Clotting disorder Sister   . Heart disease Sister     x 2  . Kidney disease Sister     x 2  . Breast cancer Sister   . Colon cancer Paternal Aunt   . Colon cancer Paternal Aunt    Social History   Social History  . Marital  status: Married    Spouse name: Rushie Chestnut.  . Number of children: 2  . Years of education: N/A   Occupational History  . Retired   .  Retired   Social History Main Topics  . Smoking status: Never Smoker  . Smokeless tobacco: Never Used  . Alcohol use No  . Drug use: No  . Sexual activity: Not Currently   Other Topics Concern  . None   Social History Narrative  . None      Review of Systems  Constitutional: Negative for fever.  HENT: Negative for congestion.   Eyes: Negative for visual disturbance.  Respiratory: Negative for shortness of breath.   Cardiovascular: Positive for chest pain (resolves with propranolol).  Gastrointestinal: Negative for abdominal pain.  Genitourinary: Negative for dysuria (resolved).  Musculoskeletal: Positive for myalgias (chronic fibromyalgias) and neck pain (chronic).  Skin: Negative for color change.  Neurological: Positive for headaches.  Hematological: Negative for adenopathy.  Psychiatric/Behavioral: The patient is nervous/anxious (from daughter living with her).        Objective:   Physical Exam  Constitutional: She is oriented to person, place, and time. She appears well-developed and well-nourished. No distress.  HENT:  Head: Normocephalic and atraumatic.  Eyes: Conjunctivae are normal. Pupils are equal, round, and reactive to light.  Neck: Neck supple.  Cardiovascular: Normal rate, regular rhythm, S1 normal and S2 normal.  Exam reveals no gallop and no friction rub.   No murmur heard. Pulmonary/Chest: Effort normal and breath sounds normal. She has no decreased breath sounds. She has no wheezes. She has no rhonchi. She has no rales.  Musculoskeletal: Normal range of motion.  Neurological: She is alert and oriented to person, place, and time.  Skin: Skin is warm and dry.  Psychiatric: Her behavior is normal. Her mood appears anxious.  Perseverates on her medical issues.   Nursing note and vitals reviewed.   BP 112/64    [name] 77   Temp 98 F (36.7 C) (Oral)   Resp 18   Ht 5' 5.5" (1.664 m)   Wt 108 lb 3.2 oz (49.1 kg)   SpO2 99%   BMI 17.73  kg/m        Assessment & Plan:  Will hold on refilling pt's prescription for vesicare due to upcoming appointment with urology.   1. Fibromyalgia   2. Osteoporosis   3. Anxiety state   4. Reactive depression   5. Chronic daily headache   6. Spasm of GI tract   7. Diaphragmatic hernia without obstruction and without gangrene   8. Gastroesophageal reflux disease with esophagitis   9. DISC DISEASE, LUMBAR   10. Midline low back pain without sciatica   11. Chronic nonintractable headache, unspecified headache type   12. Recurrent UTI   13. Need for prophylactic vaccination and inoculation against influenza     Orders Placed This Encounter  Procedures  . Urine culture  . Flu Vaccine QUAD 36+ mos IM  . Ambulatory referral to Neurology    Referral Priority:   Routine    Referral Type:   Consultation    Referral Reason:   Specialty Services Required    Requested Specialty:   Neurology    Number of Visits Requested:   1  . Care order/instruction:    AVS - please print after vaccine is given  . POCT urinalysis dipstick  . POCT Microscopic Urinalysis ([facility])   Make appt for wellness visit in 3 mos - fasting labs then.  Meds ordered this encounter  Medications  . fentaNYL (DURAGESIC - DOSED MCG/HR) 50 MCG/HR    Sig: Place 50 mcg onto the skin every 3 (three) days.  . fentaNYL (DURAGESIC - DOSED MCG/HR) 12 MCG/HR    Sig: Place 12.5 mcg onto the skin every 3 (three) days.  . celecoxib (CELEBREX) 200 MG capsule    Sig: Take 1 capsule (200 mg total) by mouth daily as needed.    Dispense:  90 capsule    Refill:  1  . clonazePAM (KLONOPIN) 1 MG tablet    Sig: Take 0.5-1 tablets (0.5-1 mg total) by mouth 2 (two) times daily as needed. anxiety/panic attacks    Dispense:  180 tablet    Refill:  1  . cyclobenzaprine (FLEXERIL) 10 MG tablet    Sig: Take 1  tablet (10 mg total) by mouth 3 (three) times daily as needed for muscle spasms.    Dispense:  90 tablet    Refill:  1  . diclofenac sodium (VOLTAREN) 1 % GEL    Sig: Apply 2 g topically 4 (four) times daily.    Dispense:  700 g    Refill:  1    700 grams for 3 mos supply  . dicyclomine (BENTYL) 10 MG capsule    Sig: Take 1-2 tab 3 times daily AC as needed for spasms and cramping.    Dispense:  270 capsule    Refill:  1  . DISCONTD: DULoxetine (CYMBALTA) 30 MG capsule    Sig: Take 1 capsule (30 mg total) by mouth daily.    Dispense:  90 capsule    Refill:  1  . esomeprazole (NEXIUM) 40 MG capsule    Sig: Take 1 capsule (40 mg total) by mouth daily before breakfast.    Dispense:  90 capsule    Refill:  1  . lidocaine (LIDODERM) 5 %    Sig: Place 1 patch onto the skin as needed. Remove & Discard patch within 12 hours. May dispense as 3 month supply    Dispense:  90 patch    Refill:  3  . propranolol (INDERAL) 10 MG tablet    Sig:  Take 1 tablet (10 mg total) by mouth 4 (four) times daily.    Dispense:  360 tablet    Refill:  1  . traZODone (DESYREL) 100 MG tablet    Sig: Take 0.5-1 tablets (50-100 mg total) by mouth at bedtime as needed for sleep. And 1/2 -1 if wakes at 4am.    Dispense:  90 tablet    Refill:  1  . gabapentin (NEURONTIN) 300 MG capsule    Sig: Take 1 capsule (300 mg total) by mouth 3 (three) times daily.    Dispense:  270 capsule    Refill:  1  . butalbital-acetaminophen-caffeine (FIORICET, ESGIC) 50-325-40 MG tablet    Sig: Take 1 tablet by mouth every 6 (six) hours as needed for headache. Headache    Dispense:  360 tablet    Refill:  0  . DULoxetine (CYMBALTA) 60 MG capsule    Sig: Take 1 capsule (60 mg total) by mouth daily.    Dispense:  90 capsule    Refill:  1   Over 40 min spent in face-to-face evaluation of and consultation with patient and coordination of care.  Over 50% of this time was spent counseling this patient.  I personally performed the  services described in this documentation, which was scribed in my presence. The recorded information has been reviewed and considered, and addended by me as needed.   Kayla [name], M.D.  [facility] [address] [place], [place] [address] [phone] phone [phone] fax  10/14/15 9:58 PM

## 2015-09-27 NOTE — Telephone Encounter (Signed)
[facility] in [place] for fax number, RXs Klonopin and Fioricet were faxed to pharmacy. Confirmation page was received at 4:05 pm.

## 2015-09-27 NOTE — Patient Instructions (Addendum)
Please make an appointment for a wellness visit.  In about 3 months - we can get all your labs then and have more time to get to know your history.   IF you received an x-ray today, you will receive an invoice from [facility]. Please contact [facility] at [phone] with questions or concerns regarding your invoice.   IF you received labwork today, you will receive an invoice from [employer]. Please contact [facility] at [phone] with questions or concerns regarding your invoice.   Our billing staff will not be able to assist you with questions regarding bills from these companies.  You will be contacted with the lab results as soon as they are available. The fastest way to get your results is to activate your My Chart account. Instructions are located on the last page of this paperwork. If you have not heard from [place] regarding the results in 2 weeks, please contact this office.    [facility] Policy for Prescribing Controlled Substances (Revised 11/2011) 1. Prescriptions for controlled substances will be filled by ONE provider at [facility] with whom you have established and developed a plan for your care, including follow-up. 2. You are encouraged to schedule an appointment with your prescriber at our appointment center for follow-up visits whenever possible. 3. If you request a prescription for the controlled substance while at [facility] for an acute problem (with someone other than your regular prescriber), you MAY be given a ONE-TIME prescription for a 30-day supply of the controlled substance, to allow time for you to return to see your regular prescriber for additional prescriptions.  Analgesic Rebound Headaches An analgesic rebound headache is a headache that returns after pain medicine (analgesic) that was taken to treat the initial headache wears off. People who suffer from tension, migraine, or cluster headaches are at risk for developing rebound  headaches. Any type of primary headache can return as a rebound headache if you regularly take analgesics more than three times a week. If the cycle of rebound headaches continues, they become chronic daily headaches.  CAUSES Analgesics frequently associated with this problem include common over-the-counter medicines like aspirin, ibuprofen, acetaminophen, sinus relief medicines, and other medicines that contain caffeine. Narcotic pain medicines are also a common cause of rebound headaches.  SIGNS AND SYMPTOMS The symptoms of rebound headaches are the same as the symptoms of your initial headache. Symptoms of specific types of headaches include: Tension headache  Pressure around the head.  Dull, aching head pain.  Pain felt over the front and sides of the head.  Tenderness in the muscles of the head, neck and shoulders. Migraine Headache  Pulsing or throbbing pain on one or both sides of the head.  Severe pain that interferes with daily activities.  Pain that is worsened by physical activity.  Nausea, vomiting, or both.  Pain with exposure to bright light, loud noises, or strong smells.  General sensitivity to bright light, loud noises, or strong smells.  Visual changes.  Numbness of one or both arms. Cluster Headaches  Severe pain that begins in or around one eye or temple.  Redness in the eye on the same side as the pain.  Droopy or swollen eyelid.  One-sided head pain.  Nausea.  Runny nose.  Sweaty, pale facial skin.  Restlessness. DIAGNOSIS  Analgesic rebound headaches are diagnosed by reviewing your medical history. This includes the nature of your initial headaches, as well as the type of pain medicines you have been using to treat  your headaches and how often you take them. TREATMENT Discontinuing frequent use of the analgesic medicine will typically reduce the frequency of the rebound episodes. This may initially worsen your headaches but eventually the  pain should become more manageable, less frequent, and less severe.  Seeing a headache specialists may helpful. He or she may be able to help you manage your headaches and to make sure there is not another cause of the headaches. Alternative methods of stress relief such as acupuncture, counseling, biofeedback, and massage may also be helpful. Talk with your health care provider about which alternative treatments might be good for you. HOME CARE INSTRUCTIONS Stopping the regular use of pain medicine can be difficult. Follow your health care provider's instructions carefully. Keep all of your appointments. Avoid triggers that are known to cause your primary headaches. SEEK MEDICAL CARE IF: You continue to experience headaches after following your health care provider's recommended treatments. SEEK IMMEDIATE MEDICAL CARE IF:  You develop new headache pain.  You develop headache pain that is different than what you have experienced in the past.  You develop numbness or tingling in your arms or legs.  You develop changes in your speech or vision. MAKE SURE YOU:  Understand these instructions.  Will watch your child's condition.  Will get help right away if your child is not doing well or gets worse.   This information is not intended to replace advice given to you by your health care provider. Make sure you discuss any questions you have with your health care provider.   Document Released: 03/23/2003 Document Revised: 01/21/2014 Document Reviewed: 07/16/2012 Elsevier Interactive Patient Education [employer].

## 2015-09-27 NOTE — Telephone Encounter (Signed)
Entered in error

## 2015-09-28 LAB — URINE CULTURE: ORGANISM ID, BACTERIA: NO GROWTH

## 2015-10-17 DIAGNOSIS — R51 Headache: Secondary | ICD-10-CM | POA: Diagnosis not present

## 2015-10-17 DIAGNOSIS — M1288 Other specific arthropathies, not elsewhere classified, other specified site: Secondary | ICD-10-CM | POA: Diagnosis not present

## 2015-10-17 DIAGNOSIS — G894 Chronic pain syndrome: Secondary | ICD-10-CM | POA: Diagnosis not present

## 2015-11-07 ENCOUNTER — Ambulatory Visit: Payer: Medicare Other | Admitting: Family Medicine

## 2015-11-07 DIAGNOSIS — Z0289 Encounter for other administrative examinations: Secondary | ICD-10-CM

## 2015-11-08 ENCOUNTER — Ambulatory Visit: Payer: Self-pay | Admitting: Cardiovascular Disease

## 2015-11-22 ENCOUNTER — Ambulatory Visit: Payer: Medicare Other

## 2015-11-24 ENCOUNTER — Ambulatory Visit (INDEPENDENT_AMBULATORY_CARE_PROVIDER_SITE_OTHER): Payer: Medicare Other | Admitting: Family Medicine

## 2015-11-24 VITALS — BP 98/70 | HR 90 | Temp 98.6°F | Ht 65.5 in | Wt 108.0 lb

## 2015-11-24 DIAGNOSIS — R300 Dysuria: Secondary | ICD-10-CM | POA: Diagnosis not present

## 2015-11-24 DIAGNOSIS — N390 Urinary tract infection, site not specified: Secondary | ICD-10-CM

## 2015-11-24 DIAGNOSIS — R319 Hematuria, unspecified: Secondary | ICD-10-CM | POA: Diagnosis not present

## 2015-11-24 DIAGNOSIS — N8110 Cystocele, unspecified: Secondary | ICD-10-CM

## 2015-11-24 DIAGNOSIS — N816 Rectocele: Secondary | ICD-10-CM | POA: Diagnosis not present

## 2015-11-24 DIAGNOSIS — R197 Diarrhea, unspecified: Secondary | ICD-10-CM | POA: Diagnosis not present

## 2015-11-24 DIAGNOSIS — N3011 Interstitial cystitis (chronic) with hematuria: Secondary | ICD-10-CM | POA: Diagnosis not present

## 2015-11-24 LAB — POC MICROSCOPIC URINALYSIS (UMFC): Mucus: ABSENT

## 2015-11-24 LAB — POCT URINALYSIS DIP (MANUAL ENTRY)
GLUCOSE UA: NEGATIVE
NITRITE UA: POSITIVE — AB
Protein Ur, POC: 100 — AB
Spec Grav, UA: 1.02
Urobilinogen, UA: 1
pH, UA: 5

## 2015-11-24 MED ORDER — URIBEL 118 MG PO CAPS: 1.0000 | ORAL_CAPSULE | Freq: Four times a day (QID) | ORAL | 0 refills | Status: DC | PRN

## 2015-11-24 MED ORDER — SOLIFENACIN SUCCINATE 5 MG PO TABS: 10.0000 mg | ORAL_TABLET | Freq: Every day | ORAL | 1 refills | Status: DC

## 2015-11-24 MED ORDER — MIRABEGRON ER 25 MG PO TB24: 25.0000 mg | ORAL_TABLET | Freq: Every day | ORAL | 2 refills | Status: DC

## 2015-11-24 NOTE — Patient Instructions (Addendum)
Meds ordered this encounter  Medications  . DISCONTD: solifenacin (VESICARE) 5 MG tablet    Sig: Take 2 tablets (10 mg total) by mouth daily.    Dispense:  90 tablet    Refill:  1  . mirabegron ER (MYRBETRIQ) 25 MG TB24 tablet    Sig: Take 1 tablet (25 mg total) by mouth daily.    Dispense:  30 tablet    Refill:  2  . Meth-Hyo-M Bl-Na Phos-Ph Sal (URIBEL) 118 MG CAPS    Sig: Take 1 capsule (118 mg total) by mouth 4 (four) times daily as needed. For irritative bladder symptoms    Dispense:  120 capsule    Refill:  0    Double up on your vesicare until you get the myrbetriq sent to you - then stop the vesicare and try that instead. Try the uribel rather than the AZO (pyridium) bot the urethral stabbing pain symptoms.  I have referred you back to Dr. Liane Comber for evaluation of your new 6 months of diarrhea and rectocele. I have referred you to urology in [facility] - they will call you to schedule an appointment.   IF you received an x-ray today, you will receive an invoice from [facility]. Please contact [facility] at [phone] with questions or concerns regarding your invoice.   IF you received labwork today, you will receive an invoice from [employer]. Please contact [facility] at [phone] with questions or concerns regarding your invoice.   Our billing staff will not be able to assist you with questions regarding bills from these companies.  You will be contacted with the lab results as soon as they are available. The fastest way to get your results is to activate your My Chart account. Instructions are located on the last page of this paperwork. If you have not heard from [place] regarding the results in 2 weeks, please contact this office.     Interstitial Cystitis Interstitial cystitis is a condition that causes inflammation of the bladder. The bladder is a hollow organ in the lower part of your abdomen. It stores urine after the  urine is made by your kidneys. With interstitial cystitis, you may have pain in the bladder area. You may also have a frequent and urgent need to urinate. The severity of interstitial cystitis can vary from person to person. You may have flare-ups of the condition, and then it may go away for a while. For many people who have this condition, it becomes a long-term problem. CAUSES The cause of this condition is not known. RISK FACTORS This condition is more likely to develop in women. SYMPTOMS Symptoms of interstitial cystitis vary, and they can change over time. Symptoms may include:  Discomfort or pain in the bladder area. This can range from mild to severe. The pain may change in intensity as the bladder fills with urine or as it empties.  Pelvic pain.  An urgent need to urinate.  Frequent urination.  Pain during sexual intercourse.  Pinpoint bleeding on the bladder wall. For women, the symptoms often get worse during menstruation. DIAGNOSIS This condition is diagnosed by evaluating your symptoms and ruling out other causes. A physical exam will be done. Various tests may be done to rule out other conditions. Common tests include:  Urine tests.  Cystoscopy. In this test, a tool that is like a very thin telescope is used to look into your bladder.  Biopsy. This involves taking a sample of tissue from the  bladder wall to be examined under a microscope. TREATMENT There is no cure for interstitial cystitis, but treatment methods are available to control your symptoms. Work closely with your health care provider to find the treatments that will be most effective for you. Treatment options may include:  Medicines to relieve pain and to help reduce the number of times that you feel the need to urinate.  Bladder training. This involves learning ways to control when you urinate, such as:  Urinating at scheduled times.  Training yourself to delay urination.  Doing exercises (Kegel  exercises) to strengthen the muscles that control urine flow.  Lifestyle changes, such as changing your diet or taking steps to control stress.  Use of a device that provides electrical stimulation in order to reduce pain.  A procedure that stretches your bladder by filling it with air or fluid.  Surgery. This is rare. It is only done for extreme cases if other treatments do not help. HOME CARE INSTRUCTIONS  Take medicines only as directed by your health care provider.  Use bladder training techniques as directed.  Keep a bladder diary to find out which foods, liquids, or activities make your symptoms worse.  Use your bladder diary to schedule bathroom trips. If you are away from home, plan to be near a bathroom at each of your scheduled times.  Make sure you urinate just before you leave the house and just before you go to bed.  Do Kegel exercises as directed by your health care provider.  Do not drink alcohol.  Do not use any tobacco products, including cigarettes, chewing tobacco, or electronic cigarettes. If you need help quitting, ask your health care provider.  Make dietary changes as directed by your health care provider. You may need to avoid spicy foods and foods that contain a high amount of potassium.  Limit your drinking of beverages that stimulate urination. These include soda, coffee, and tea.  Keep all follow-up visits as directed by your health care provider. This is important. SEEK MEDICAL CARE IF:  Your symptoms do not get better after treatment.  Your pain and discomfort are getting worse.  You have more frequent urges to urinate.  You have a fever. SEEK IMMEDIATE MEDICAL CARE IF:  You are not able to control your bladder at all.   This information is not intended to replace advice given to you by your health care provider. Make sure you discuss any questions you have with your health care provider.   Document Released: 09/01/2003 Document Revised:  01/21/2014 Document Reviewed: 09/07/2013 Elsevier Interactive Patient Education [employer].

## 2015-11-24 NOTE — Progress Notes (Signed)
Subjective:  By signing my name below, I, [name], attest that this documentation has been prepared under the direction and in the presence of [name], MD Electronically Signed: [name], ED Scribe 11/24/2015 at 12:04 PM.   Patient ID: Kayla [name], female    DOB: 1945-11-05, 70 y.o.   MRN: [id number]  Chief Complaint  Patient presents with  . Dysuria    x 1 week   . Hematuria   HPI HPI Comments: Kayla [name] is a 70 y.o. female who presents to the [facility] complaining of UTI symptoms for the past week. Pt has a h/o recurrent UTIs. Last saw pt 2 months prior. At that time she had been on Keflex for sharp, stabbing pain while urinating followed by a sulfa antibiotics. She has a baseline of urinary incontinence for multiple abdominal surgeries, pregnancies and UTI. Had an appointment with [facility] on 10/31. H/o chronic interstitial cystitis and kidney stones. Also has IBS and fibromyagia.   Today, pt reports ongoing dysuria for the past week. Pt reports associated hematuria and pelvic pain as well. She states that symptoms never completley resolved from her last UTI 2 months ago. Pt also reports similar pelvic pain in the past when she had a kidney stone. She states that [facility] cancelled and never sent her a letter. She can not see her until December. Pt states that she used to be grossly constipated and not experience a BM in approximately 1 month, but now she is experiencing diarrhea. She denies fevers. Pt has pyridium at home which she has been taking with very mild relief. She wants to increase her Vesicare which she takes prior to bed; states that she is still getting up 5-6 times at night to urinate.   Pt was sent to dermatology by Dr. [name] earlier in 2017 for a rash on her right face. Pt states that the rash gets worse when she becomes upset. She states that dermatology, Dr. Garrison Columbus at [place], ruled out skin CA and states  that the dilated blood vessels could use laser treatments. Pt has stopped wearing makeup do to cataracts and has not tried any treatments.   Past Medical History:  Diagnosis Date  . Allergy   . Anemia    anemia in the past  . Angina    takes Propanolol prn and it stops her chest  . Anxiety   . Barrett esophagus    not consistently present  . Cataract   . Chronic kidney disease    kidney stones and UTI  . Complication of anesthesia    either  BP or pulse dropped with last surgery  . DDD (degenerative disc disease)    cervical and lumbar  . Dementia   . Depression    post traumatic stress  disorder  . Diabetes mellitus    sugar goes up and down diet controlled  . Dysrhythmia    brought on by stress  . Fatigue   . Fibromyalgia    neuropathy knees ankles and toes, bladder  . GERD (gastroesophageal reflux disease)   . H/O hyperparathyroidism   . Headache   . Headache(784.0)    migraines  . Hiatal hernia   . History of kidney stones   . Hypertension    3 small brain aneurysms  . IBS (irritable bowel syndrome)   . Macular degeneration   . Migraines   . Osteoarthritis    all over  . Osteopetrosis   .  Osteoporosis   . Peripheral vascular disease (HCC)    superficial phlebitis in left calf  . Personal history of colonic polyps 07/22/2013   07/2013 - 3 small adenomas - repeat colonoscopy 2018   Current Outpatient Prescriptions on File Prior to Visit  Medication Sig Dispense Refill  . Ascorbic Acid (VITAMIN C) 1000 MG tablet Take 1,000 mg by mouth daily.    . beta carotene w/minerals (OCUVITE) tablet Take 1 tablet by mouth 2 (two) times daily.    . butalbital-acetaminophen-caffeine (FIORICET, ESGIC) 50-325-40 MG tablet Take 1 tablet by mouth every 6 (six) hours as needed for headache. Headache 360 tablet 0  . CALCIUM-MAGNESIUM-ZINC PO Take by mouth daily.    . celecoxib (CELEBREX) 200 MG capsule Take 1 capsule (200 mg total) by mouth daily as needed. 90 capsule 1  .  cetirizine (ZYRTEC) 10 MG tablet Take 10 mg by mouth daily.    . Cholecalciferol (VITAMIN D3) 2000 UNITS TABS Take 2,000 Units by mouth daily.    . clonazePAM (KLONOPIN) 1 MG tablet Take 0.5-1 tablets (0.5-1 mg total) by mouth 2 (two) times daily as needed. anxiety/panic attacks 180 tablet 1  . Cyanocobalamin (VITAMIN B-12) 1000 MCG SUBL Place 5,000 mcg under the tongue daily.     . cyclobenzaprine (FLEXERIL) 10 MG tablet Take 1 tablet (10 mg total) by mouth 3 (three) times daily as needed for muscle spasms. 90 tablet 1  . diclofenac sodium (VOLTAREN) 1 % GEL Apply 2 g topically 4 (four) times daily. 700 g 1  . dicyclomine (BENTYL) 10 MG capsule Take 1-2 tab 3 times daily AC as needed for spasms and cramping. 270 capsule 1  . diltiazem 2 % GEL Apply 1 application topically 2 (two) times daily. 30 g 0  . docusate sodium (COLACE) 250 MG capsule Take 250 mg by mouth daily.    . DULoxetine (CYMBALTA) 60 MG capsule Take 1 capsule (60 mg total) by mouth daily. 90 capsule 1  . esomeprazole (NEXIUM) 40 MG capsule Take 1 capsule (40 mg total) by mouth daily before breakfast. 90 capsule 1  . fentaNYL (DURAGESIC - DOSED MCG/HR) 12 MCG/HR Place 12.5 mcg onto the skin every 3 (three) days.    . fentaNYL (DURAGESIC - DOSED MCG/HR) 50 MCG/HR Place 50 mcg onto the skin every 3 (three) days.    . fluconazole (DIFLUCAN) 150 MG tablet Take 1 tablet (150 mg total) by mouth once. Repeat in 72 hours if needed 2 tablet 0  . fluorouracil (EFUDEX) 5 % cream Apply 1 application topically 2 (two) times daily. Reported on 03/08/2015    . gabapentin (NEURONTIN) 300 MG capsule Take 1 capsule (300 mg total) by mouth 3 (three) times daily. 270 capsule 1  . hypromellose (SYSTANE OVERNIGHT THERAPY) 0.3 % GEL ophthalmic ointment Place 1 drop into both eyes at bedtime. Reported on 02/11/2015    . ketoconazole (NIZORAL) 2 % cream Apply 1 application topically daily. Under breasts 15 g 0  . lidocaine (LIDODERM) 5 % Place 1 patch onto the  skin as needed. Remove & Discard patch within 12 hours. May dispense as 3 month supply 90 patch 3  . Loteprednol Etabonate (LOTEMAX) 0.5 % GEL Place 1 drop into both eyes 2 (two) times daily. Reported on 02/11/2015    . Omega-3 Fatty Acids (SEA-OMEGA PO) Take 1,000 mg by mouth daily.    . polyethylene glycol powder (GLYCOLAX/MIRALAX) powder Take 17 g by mouth 2 (two) times daily. Reported on 03/08/2015 850 g 1  .  propranolol (INDERAL) 10 MG tablet Take 1 tablet (10 mg total) by mouth 4 (four) times daily. 360 tablet 1  . pyridoxine (B-6) 200 MG tablet Take 200 mg by mouth daily.    . solifenacin (VESICARE) 5 MG tablet Take 1 tablet (5 mg total) by mouth daily. 90 tablet 1  . sucralfate (CARAFATE) 1 GM/10ML suspension Take 10 mLs (1 g total) by mouth 4 (four) times daily -  with meals and at bedtime. 420 mL 3  . SUMAtriptan (IMITREX) 100 MG tablet Take 1 tablet (100 mg total) by mouth every 3 (three) days as needed for migraine. Headache 30 tablet 1  . traZODone (DESYREL) 100 MG tablet Take 0.5-1 tablets (50-100 mg total) by mouth at bedtime as needed for sleep. And 1/2 -1 if wakes at 4am. 90 tablet 1  . triamcinolone (NASACORT) 55 MCG/ACT AERO nasal inhaler Place 2 sprays into the nose daily. 3 Inhaler 1  . zoster vaccine live, PF, (ZOSTAVAX) 16109 UNT/0.65ML injection Inject 19,400 Units into the skin once. Administer at pharmacy 1 each 0  . fentaNYL (DURAGESIC - DOSED MCG/HR) 75 MCG/HR Place 1 patch onto the skin every 3 (three) days.    Marland Kitchen HYDROcodone-acetaminophen (NORCO) 7.5-325 MG per tablet Take 1 tablet by mouth every 6 (six) hours as needed. Pain     . [DISCONTINUED] buPROPion (WELLBUTRIN) 75 MG tablet Take 75 mg by mouth daily after breakfast.      No current facility-administered medications on file prior to visit.    Allergies  Allergen Reactions  . Ambien [Zolpidem Tartrate]     Hallucinations  . Codeine Anaphylaxis  . Oxycodone-Acetaminophen Shortness Of Breath  . Tylox  [Oxycodone-Acetaminophen] Anaphylaxis    Chest pain  . Darvocet [Propoxyphene N-Acetaminophen]     Chest pain  . Darvon [Propoxyphene] Itching  . Food Swelling    Peanuts-wheat-  . Lorcet [Hydrocodone-Acetaminophen]     Chest pain  . Opium     hallucinations  . Vicodin [Hydrocodone-Acetaminophen] Other (See Comments)    Chest Pain   . Wheat Bran   . Zolpidem Tartrate Other (See Comments)    Makes her go crazy  . Zolpidem Tartrate     hallucinations  . Amoxicillin Nausea And Vomiting    Reaction:  Migraine headache  . Penicillins Nausea And Vomiting    Migraine Headaches   Review of Systems  Genitourinary: Positive for dysuria, hematuria and pelvic pain.  Skin: Positive for rash.   BP 98/70   Pulse 90   Temp 98.6 F (37 C) (Oral)   Ht 5' 5.5" (1.664 m)   Wt 108 lb (49 kg)   SpO2 98%   BMI 17.70 kg/m     Objective:   Physical Exam  Constitutional: She is oriented to person, place, and time. She appears well-developed and well-nourished. No distress.  HENT:  Head: Normocephalic and atraumatic.  Eyes: Conjunctivae and EOM are normal.  Neck: Neck supple. No tracheal deviation present.  Cardiovascular: Normal rate.   Pulmonary/Chest: Effort normal. No respiratory distress.  Musculoskeletal: Normal range of motion.  Neurological: She is alert and oriented to person, place, and time.  Skin: Skin is warm and dry.  Psychiatric: She has a normal mood and affect. Her behavior is normal.  Nursing note and vitals reviewed.  Results for orders placed or performed in visit on 11/24/15  POCT urinalysis dipstick  Result Value Ref Range   Color, UA yellow yellow   Clarity, UA clear clear   Glucose, UA  negative negative   Bilirubin, UA small (A) negative   Ketones, POC UA trace (5) (A) negative   Spec Grav, UA 1.020    Blood, UA small (A) negative   pH, UA 5.0    Protein Ur, POC =100 (A) negative   Urobilinogen, UA 1.0    Nitrite, UA Positive (A) Negative   Leukocytes,  UA small (1+) (A) Negative  POCT Microscopic Urinalysis ([facility])  Result Value Ref Range   WBC,UR,HPF,POC Too numerous to count  (A) None WBC/hpf   RBC,UR,HPF,POC Many (A) None RBC/hpf   Bacteria None None, Too numerous to count   Mucus Absent Absent   Epithelial Cells, UR Per Microscopy Few (A) None, Too numerous to count cells/hpf      Assessment & Plan:   1. Dysuria   2. Cystocele with rectocele - pt would like referral to urology in [facility]  3. Diarrhea, unspecified type - needs to f/u with her GI Dr Liane Comber due to 6 mos of diarrhea w/o prob prior  4. Urinary tract infection with hematuria, site unspecified   5. Interstitial cystitis (chronic) with hematuria    Try to increase vesicare from 5 to 10mg . If sxs not controlled, stop vesicare and try myrbetriq instead. Azo not helpful so try uribel instead.  Orders Placed This Encounter  Procedures  . Urine culture  . Ambulatory referral to Gastroenterology    Referral Priority:   Routine    Referral Type:   Consultation    Referral Reason:   Specialty Services Required    Number of Visits Requested:   1  . Ambulatory referral to Urology    Referral Priority:   Routine    Referral Type:   Consultation    Referral Reason:   Specialty Services Required    Requested Specialty:   Urology    Number of Visits Requested:   1  . POCT urinalysis dipstick  . POCT Microscopic Urinalysis ([facility])    Meds ordered this encounter  Medications  . DISCONTD: solifenacin (VESICARE) 5 MG tablet    Sig: Take 2 tablets (10 mg total) by mouth daily.    Dispense:  90 tablet    Refill:  1  . mirabegron ER (MYRBETRIQ) 25 MG TB24 tablet    Sig: Take 1 tablet (25 mg total) by mouth daily.    Dispense:  30 tablet    Refill:  2  . Meth-Hyo-M Bl-Na Phos-Ph Sal (URIBEL) 118 MG CAPS    Sig: Take 1 capsule (118 mg total) by mouth 4 (four) times daily as needed. For irritative bladder symptoms    Dispense:  120 capsule    Refill:  0    I personally  performed the services described in this documentation, which was scribed in my presence. The recorded information has been reviewed and considered, and addended by me as needed.   [name], M.D.  [facility] [address] [place], [place] [address] [phone] phone [phone] fax  12/03/15 1:45 AM

## 2015-11-26 LAB — URINE CULTURE: ORGANISM ID, BACTERIA: NO GROWTH

## 2015-11-27 ENCOUNTER — Ambulatory Visit (INDEPENDENT_AMBULATORY_CARE_PROVIDER_SITE_OTHER): Payer: Medicare Other | Admitting: Neurology

## 2015-11-27 ENCOUNTER — Encounter: Payer: Self-pay | Admitting: Neurology

## 2015-11-27 ENCOUNTER — Telehealth: Payer: Self-pay | Admitting: Neurology

## 2015-11-27 VITALS — BP 104/62 | HR 96 | Ht 65.0 in | Wt 110.0 lb

## 2015-11-27 DIAGNOSIS — G43709 Chronic migraine without aura, not intractable, without status migrainosus: Secondary | ICD-10-CM | POA: Diagnosis not present

## 2015-11-27 DIAGNOSIS — F32A Depression, unspecified: Secondary | ICD-10-CM

## 2015-11-27 DIAGNOSIS — F329 Major depressive disorder, single episode, unspecified: Secondary | ICD-10-CM | POA: Diagnosis not present

## 2015-11-27 DIAGNOSIS — G4440 Drug-induced headache, not elsewhere classified, not intractable: Secondary | ICD-10-CM

## 2015-11-27 MED ORDER — GABAPENTIN 300 MG PO CAPS: 300.0000 mg | ORAL_CAPSULE | Freq: Two times a day (BID) | ORAL | 2 refills | Status: DC

## 2015-11-27 MED ORDER — PREDNISONE 10 MG PO TABS: ORAL_TABLET | ORAL | 0 refills | Status: DC

## 2015-11-27 NOTE — Telephone Encounter (Signed)
[facility] called and they do not take Medicare.  The patient would like to go some place that takes her insurance, she can't afford to pay out of pocket.

## 2015-11-27 NOTE — Telephone Encounter (Signed)
I would recommend referral to Dr. Hulan Saas for neck pain and headache

## 2015-11-27 NOTE — Progress Notes (Signed)
NEUROLOGY CONSULTATION NOTE  Kayla [name] MRN: [id number] DOB: Nov 13, 1945  Referring provider: Dr. Brigitte Pulse Primary care provider: Dr. Brigitte Pulse  Reason for consult:  headache  HISTORY OF PRESENT ILLNESS: Kayla [name] is a 70 year old woman with fibromyalgia, anxiety, degenerative disc disease of cervical and lumbar spine, and history of nephrolithiasis who presents for headache.  Onset:  Since 1970, after her twin newborns passed away.  She has lived with life-long family-related stress Location:  Migraines are unilateral/parietal or bilateral retro-orbital, chronic tension type (bifrontal) Quality:  Severe crushing Intensity:  10/10 Aura:  no Prodrome:  no Associated symptoms:  Photophobia, phonophobia.  No nausea, vomiting or visual disturbance Duration:  Migraines 1 hour with sumatriptan, other headaches 15 minutes with Fioricet Frequency:  Daily (3 to 4 days a month are severe migraine) Triggers/exacerbating factors:  Stress, Nexium Relieving factors:  Clonazepam, Fioricet, sumatriptan Activity:  Cannot function when severe (3-4 days per month)  Past NSAIDS:  Ibuprofen, naproxen Past analgesics:  Cafergot, Excedrin, Tylenol, Tramadol Past abortive triptans:  no Past muscle relaxants:  no Past anti-emetic:  Zofran ODT 8mg  Past antihypertensive medications:  Lopressor Past antidepressant medications:  Elavil, Effexor, Zoloft Past anticonvulsant medications:  Topiramate Past vitamins/Herbal/Supplements:  no Other past therapies:  yoga  Current NSAIDS:  Celebrex Current analgesics:  Fioricet (daily), Lidoderm, voltaren 1% gel, Fentanyl patch Current triptans:  sumatriptan 100mg  Current anti-emetic:  no Current muscle relaxants:  Flexeril Current anti-anxiolytic:  clonazepam Current sleep aide:  trazodone Current Antihypertensive medications:  propranolol 10mg  four times daily Current Antidepressant medications:  Cymbalta 60mg  Current Anticonvulsant medications:   gabapentin 300mg  once daily Current Vitamins/Herbal/Supplements:  Coenzyme q10 Other therapy:  no  Caffeine:  2 cups of coffee daily Alcohol:  no Smoker:  no Diet:  poor Exercise:  no Depression/stress:  Family-related stress Sleep hygiene:  poor Family history of headache:  Father, sister, brother  MRI and MRA of brain from 07/23/11 were personally reviewed and unremarkable.  PAST MEDICAL HISTORY: Past Medical History:  Diagnosis Date  . Allergy   . Anemia    anemia in the past  . Angina    takes Propanolol prn and it stops her chest  . Anxiety   . Barrett esophagus    not consistently present  . Cataract   . Chronic kidney disease    kidney stones and UTI  . Complication of anesthesia    either  BP or pulse dropped with last surgery  . DDD (degenerative disc disease)    cervical and lumbar  . Dementia   . Depression    post traumatic stress  disorder  . Diabetes mellitus    sugar goes up and down diet controlled  . Dysrhythmia    brought on by stress  . Fatigue   . Fibromyalgia    neuropathy knees ankles and toes, bladder  . GERD (gastroesophageal reflux disease)   . H/O hyperparathyroidism   . Headache   . Headache(784.0)    migraines  . Hiatal hernia   . History of kidney stones   . Hypertension    3 small brain aneurysms  . IBS (irritable bowel syndrome)   . Macular degeneration   . Migraines   . Osteoarthritis    all over  . Osteopetrosis   . Osteoporosis   . Peripheral vascular disease (HCC)    superficial phlebitis in left calf  . Personal history of colonic polyps 07/22/2013   07/2013 - 3 small adenomas - repeat colonoscopy 2018  PAST SURGICAL HISTORY: Past Surgical History:  Procedure Laterality Date  . ABDOMINAL HYSTERECTOMY    . APPENDECTOMY    . CARDIAC CATHETERIZATION  03/22/1991   EF 61%  . CARDIOVASCULAR STRESS TEST  10/03/2006  . CHOLECYSTECTOMY    . COLONOSCOPY  06/23/2008   normal  . DILATION AND CURETTAGE OF UTERUS     x2  .  ESOPHAGUS SURGERY    . EXPLORATORY LAPAROTOMY  1993  . GASTRIC BYPASS  1999  . GASTRIC RESTRICTION SURGERY  1991  . Right Arm Surgery    . Right Knee Arthroscopy    . Rt wrist fx  2009  . stomach stappeling  1991  . STONE EXTRACTION WITH BASKET  2012  . TONSILLECTOMY    . TUBAL LIGATION    . UPPER GASTROINTESTINAL ENDOSCOPY  06/23/2008   w/Dil, Barrett's esophagus  . US ECHOCARDIOGRAPHY  01/21/2007   EF 55-60%  . US ECHOCARDIOGRAPHY  08/30/2003   EF 55-60%    MEDICATIONS: Current Outpatient Prescriptions on File Prior to Visit  Medication Sig Dispense Refill  . Ascorbic Acid (VITAMIN C) 1000 MG tablet Take 1,000 mg by mouth daily.    . beta carotene w/minerals (OCUVITE) tablet Take 1 tablet by mouth 2 (two) times daily.    Marland Kitchen CALCIUM-MAGNESIUM-ZINC PO Take by mouth daily.    . celecoxib (CELEBREX) 200 MG capsule Take 1 capsule (200 mg total) by mouth daily as needed. 90 capsule 1  . cetirizine (ZYRTEC) 10 MG tablet Take 10 mg by mouth daily.    . Cholecalciferol (VITAMIN D3) 2000 UNITS TABS Take 2,000 Units by mouth daily.    . clonazePAM (KLONOPIN) 1 MG tablet Take 0.5-1 tablets (0.5-1 mg total) by mouth 2 (two) times daily as needed. anxiety/panic attacks 180 tablet 1  . Cyanocobalamin (VITAMIN B-12) 1000 MCG SUBL Place 5,000 mcg under the tongue daily.     . cyclobenzaprine (FLEXERIL) 10 MG tablet Take 1 tablet (10 mg total) by mouth 3 (three) times daily as needed for muscle spasms. 90 tablet 1  . diclofenac sodium (VOLTAREN) 1 % GEL Apply 2 g topically 4 (four) times daily. 700 g 1  . dicyclomine (BENTYL) 10 MG capsule Take 1-2 tab 3 times daily AC as needed for spasms and cramping. 270 capsule 1  . diltiazem 2 % GEL Apply 1 application topically 2 (two) times daily. 30 g 0  . docusate sodium (COLACE) 250 MG capsule Take 250 mg by mouth daily.    . DULoxetine (CYMBALTA) 60 MG capsule Take 1 capsule (60 mg total) by mouth daily. 90 capsule 1  . esomeprazole (NEXIUM) 40 MG capsule  Take 1 capsule (40 mg total) by mouth daily before breakfast. 90 capsule 1  . fentaNYL (DURAGESIC - DOSED MCG/HR) 12 MCG/HR Place 12.5 mcg onto the skin every 3 (three) days.    . fentaNYL (DURAGESIC - DOSED MCG/HR) 50 MCG/HR Place 50 mcg onto the skin every 3 (three) days.    . fluconazole (DIFLUCAN) 150 MG tablet Take 1 tablet (150 mg total) by mouth once. Repeat in 72 hours if needed 2 tablet 0  . fluorouracil (EFUDEX) 5 % cream Apply 1 application topically 2 (two) times daily. Reported on 03/08/2015    . hypromellose (SYSTANE OVERNIGHT THERAPY) 0.3 % GEL ophthalmic ointment Place 1 drop into both eyes at bedtime. Reported on 02/11/2015    . ketoconazole (NIZORAL) 2 % cream Apply 1 application topically daily. Under breasts 15 g 0  . lidocaine (LIDODERM)  5 % Place 1 patch onto the skin as needed. Remove & Discard patch within 12 hours. May dispense as 3 month supply 90 patch 3  . Loteprednol Etabonate (LOTEMAX) 0.5 % GEL Place 1 drop into both eyes 2 (two) times daily. Reported on 02/11/2015    . Meth-Hyo-M Bl-Na Phos-Ph Sal (URIBEL) 118 MG CAPS Take 1 capsule (118 mg total) by mouth 4 (four) times daily as needed. For irritative bladder symptoms 120 capsule 0  . mirabegron ER (MYRBETRIQ) 25 MG TB24 tablet Take 1 tablet (25 mg total) by mouth daily. 30 tablet 2  . Omega-3 Fatty Acids (SEA-OMEGA PO) Take 1,000 mg by mouth daily.    . polyethylene glycol powder (GLYCOLAX/MIRALAX) powder Take 17 g by mouth 2 (two) times daily. Reported on 03/08/2015 850 g 1  . propranolol (INDERAL) 10 MG tablet Take 1 tablet (10 mg total) by mouth 4 (four) times daily. 360 tablet 1  . pyridoxine (B-6) 200 MG tablet Take 200 mg by mouth daily.    . sucralfate (CARAFATE) 1 GM/10ML suspension Take 10 mLs (1 g total) by mouth 4 (four) times daily -  with meals and at bedtime. 420 mL 3  . SUMAtriptan (IMITREX) 100 MG tablet Take 1 tablet (100 mg total) by mouth every 3 (three) days as needed for migraine. Headache 30 tablet  1  . traZODone (DESYREL) 100 MG tablet Take 0.5-1 tablets (50-100 mg total) by mouth at bedtime as needed for sleep. And 1/2 -1 if wakes at 4am. 90 tablet 1  . triamcinolone (NASACORT) 55 MCG/ACT AERO nasal inhaler Place 2 sprays into the nose daily. 3 Inhaler 1  . zoster vaccine live, PF, (ZOSTAVAX) 24097 UNT/0.65ML injection Inject 19,400 Units into the skin once. Administer at pharmacy 1 each 0  . [DISCONTINUED] buPROPion (WELLBUTRIN) 75 MG tablet Take 75 mg by mouth daily after breakfast.      No current facility-administered medications on file prior to visit.     ALLERGIES: Allergies  Allergen Reactions  . Ambien [Zolpidem Tartrate]     Hallucinations  . Codeine Anaphylaxis  . Oxycodone-Acetaminophen Shortness Of Breath  . Tylox [Oxycodone-Acetaminophen] Anaphylaxis    Chest pain  . Darvocet [Propoxyphene N-Acetaminophen]     Chest pain  . Darvon [Propoxyphene] Itching  . Food Swelling    Peanuts-wheat-  . Lorcet [Hydrocodone-Acetaminophen]     Chest pain  . Opium     hallucinations  . Vicodin [Hydrocodone-Acetaminophen] Other (See Comments)    Chest Pain   . Wheat Bran   . Zolpidem Tartrate Other (See Comments)    Makes her go crazy  . Zolpidem Tartrate     hallucinations  . Amoxicillin Nausea And Vomiting    Reaction:  Migraine headache  . Penicillins Nausea And Vomiting    Migraine Headaches    FAMILY HISTORY: Family History  Problem Relation Age of Onset  . Stroke Mother   . Lung cancer Mother   . Heart disease Mother   . Diabetes Mother   . Hypertension Father   . Stroke Father   . Lung cancer Father   . Heart disease Father   . Kidney disease Father   . Seizures Father     epilepsy, and sisters x 2  . Pancreatic cancer Sister   . Lung cancer Sister   . Colon cancer Maternal Aunt     12 relatives  . Uterine cancer      aunt  . Heart disease  grandmother  . Clotting disorder Sister   . Heart disease Sister     x 2  . Kidney disease Sister      x 2  . Breast cancer Sister   . Colon cancer Paternal Aunt   . Colon cancer Paternal Aunt     SOCIAL HISTORY: Social History   Social History  . Marital status: Married    Spouse name: Rushie Chestnut.  . Number of children: 2  . Years of education: N/A   Occupational History  . Retired   .  Retired   Social History Main Topics  . Smoking status: Never Smoker  . Smokeless tobacco: Never Used  . Alcohol use No  . Drug use: No  . Sexual activity: Not Currently   Other Topics Concern  . Not on file   Social History Narrative  . No narrative on file    REVIEW OF SYSTEMS: Constitutional: No fevers, chills, or sweats, no generalized fatigue, change in appetite Eyes: blurred vision due to complication of cataract surgery Ear, nose and throat: No hearing loss, ear pain, nasal congestion, sore throat Cardiovascular: No chest pain, palpitations Respiratory:  No shortness of breath at rest or with exertion, wheezes GastrointestinaI: No nausea, vomiting, diarrhea, abdominal pain, fecal incontinence Genitourinary:  No dysuria, urinary retention or frequency Musculoskeletal:  Neck pain, back pain Integumentary: No rash, pruritus, skin lesions Neurological: as above Psychiatric: depression, insomnia, anxiety Endocrine: No palpitations, fatigue, diaphoresis, mood swings, change in appetite, change in weight, increased thirst Hematologic/Lymphatic:  No purpura, petechiae. Allergic/Immunologic: no itchy/runny eyes, nasal congestion, recent allergic reactions, rashes  PHYSICAL EXAM: Vitals:   11/27/15 0807  BP: 104/62  Pulse: 96   General: No acute distress.  Patient appears well-groomed.  Head:  Normocephalic/atraumatic Eyes:  fundi examined but not visualized Neck: supple, paraspinal tenderness, full range of motion Heart: regular rate and rhythm Lungs: Clear to auscultation bilaterally. Vascular: No carotid bruits. Neurological Exam: Mental status: alert and oriented to  person, place, and time, recent and remote memory intact, fund of knowledge intact, attention and concentration intact, speech fluent and not dysarthric, language intact. Cranial nerves: CN I: not tested CN II: pupils equal, round and reactive to light, visual fields intact CN III, IV, VI:  full range of motion, no nystagmus, no ptosis CN V: facial sensation intact CN VII: upper and lower face symmetric CN VIII: hearing intact CN IX, X: gag intact, uvula midline CN XI: sternocleidomastoid and trapezius muscles intact CN XII: tongue midline Bulk & Tone: normal, no fasciculations. Motor:  5/5 throughout  Sensation: temperature and vibration sensation intact. Deep Tendon Reflexes:  2+ throughout, toes downgoing.  Finger to nose testing:  Without dysmetria.  Heel to shin:  Without dysmetria.  Gait:  Normal station and stride.  Able to turn and tandem walk. Romberg negative.  IMPRESSION: Chronic migraine Medication overuse headache Depression   PLAN: 1.  Increase gabapentin to 300mg  twice daily 2.  Taper off Fioricet and bridge with prednisone taper 3.  Refer to [facility] (may benefit from massage, dry needling, biofeedback, etc) 4.  Sumatriptan 100mg  for abortive therapy  5.  Stop caffeine 6.  Follow up in 3 months.   Thank you for allowing me to take part in the care of this patient.  Metta Clines, DO  CC:  [name], MD

## 2015-11-27 NOTE — Patient Instructions (Signed)
Migraine Recommendations: 1.  Increase gabapentin to 300mg  twice daily.  Call in 4 weeks with update and we can adjust dose if needed. 2.  Take sumatriptan at earliest onset of headache.  May repeat dose once in 2 hours if needed.  Do not exceed two tablets in 24 hours. 3.  Limit use of pain relievers to no more than 2 days out of the week.  We must slowly get off of Fioricet:  Take Fioricet only for 6 days out of the week for 1 week  Then only 5 days out of the week for 1 week  Then only 4 days out of the week for 1 week  Then only 3 days out of the week for 1 week  Then only 2 days out of the week for 1 week  Then only 1 day out of the week for 1 week  Then STOP. 4.  Be aware of common food triggers such as processed sweets, processed foods with nitrites (such as deli meat, hot dogs, sausages), foods with MSG, alcohol (such as wine), chocolate, certain cheeses, certain fruits (dried fruits, some citrus fruit), vinegar, diet soda. 4.  Avoid caffeine 5.  Routine exercise 6.  Proper sleep hygiene 7.  Stay adequately hydrated with water 8.  Keep a headache diary. 9.  Maintain proper stress management. 10.  Do not skip meals. 11.  Consider supplements:  Magnesium citrate 400mg  to 600mg  daily, riboflavin 400mg , Coenzyme Q 10 100mg  three times daily 12.  We will refer you to [facility] 13.  Follow up in 3 months.   Migraine Headache A migraine headache is an intense, throbbing pain on one or both sides of your head. A migraine can last for 30 minutes to several hours. CAUSES  The exact cause of a migraine headache is not always known. However, a migraine may be caused when nerves in the brain become irritated and release chemicals that cause inflammation. This causes pain. Certain things may also trigger migraines, such as:  Alcohol.  Smoking.  Stress.  Menstruation.  Aged cheeses.  Foods or drinks that contain nitrates, glutamate, aspartame, or tyramine.  Lack of  sleep.  Chocolate.  Caffeine.  Hunger.  Physical exertion.  Fatigue.  Medicines used to treat chest pain (nitroglycerine), birth control pills, estrogen, and some blood pressure medicines. SIGNS AND SYMPTOMS  Pain on one or both sides of your head.  Pulsating or throbbing pain.  Severe pain that prevents daily activities.  Pain that is aggravated by any physical activity.  Nausea, vomiting, or both.  Dizziness.  Pain with exposure to bright lights, loud noises, or activity.  General sensitivity to bright lights, loud noises, or smells. Before you get a migraine, you may get warning signs that a migraine is coming (aura). An aura may include:  Seeing flashing lights.  Seeing bright spots, halos, or zigzag lines.  Having tunnel vision or blurred vision.  Having feelings of numbness or tingling.  Having trouble talking.  Having muscle weakness. DIAGNOSIS  A migraine headache is often diagnosed based on:  Symptoms.  Physical exam.  A CT scan or MRI of your head. These imaging tests cannot diagnose migraines, but they can help rule out other causes of headaches. TREATMENT Medicines may be given for pain and nausea. Medicines can also be given to help prevent recurrent migraines.  HOME CARE INSTRUCTIONS  Only take over-the-counter or prescription medicines for pain or discomfort as directed by your health care provider. The use of long-term  narcotics is not recommended.  Lie down in a dark, quiet room when you have a migraine.  Keep a journal to find out what may trigger your migraine headaches. For example, write down:  What you eat and drink.  How much sleep you get.  Any change to your diet or medicines.  Limit alcohol consumption.  Quit smoking if you smoke.  Get 7-9 hours of sleep, or as recommended by your health care provider.  Limit stress.  Keep lights dim if bright lights bother you and make your migraines worse. SEEK IMMEDIATE MEDICAL  CARE IF:   Your migraine becomes severe.  You have a fever.  You have a stiff neck.  You have vision loss.  You have muscular weakness or loss of muscle control.  You start losing your balance or have trouble walking.  You feel faint or pass out.  You have severe symptoms that are different from your first symptoms. MAKE SURE YOU:   Understand these instructions.  Will watch your condition.  Will get help right away if you are not doing well or get worse.   This information is not intended to replace advice given to you by your health care provider. Make sure you discuss any questions you have with your health care provider.   Document Released: 12/31/2004 Document Revised: 01/21/2014 Document Reviewed: 09/07/2012 Elsevier Interactive Patient Education [employer].

## 2015-11-27 NOTE — Progress Notes (Signed)
Referral faxed to:  [facility] Phone number:           [phone] Fax Number:     [phone] Address:     [address], [place] [address]

## 2015-11-27 NOTE — Telephone Encounter (Signed)
Please advise. Next preferred location for referral?

## 2015-11-27 NOTE — Telephone Encounter (Signed)
Referral placed. Mychart message sent to pt.

## 2015-11-28 ENCOUNTER — Telehealth: Payer: Self-pay | Admitting: Neurology

## 2015-11-28 DIAGNOSIS — G43C0 Periodic headache syndromes in child or adult, not intractable: Secondary | ICD-10-CM

## 2015-11-28 MED ORDER — SUMATRIPTAN SUCCINATE 100 MG PO TABS: ORAL_TABLET | ORAL | 1 refills | Status: DC

## 2015-11-28 NOTE — Telephone Encounter (Signed)
Patient needs to talk to someone about medication and physical therapy Please call [phone]

## 2015-11-28 NOTE — Telephone Encounter (Signed)
Prescribe her sumatriptan 100mg  tablets.  Take 1 tablet at earliest onset of headache and may repeat once after 2 hours if needed (not to exceed 2 tablets in 24 hours)

## 2015-11-28 NOTE — Telephone Encounter (Signed)
RX sent in

## 2015-11-28 NOTE — Telephone Encounter (Signed)
Pt called to ask about a abortive therapy medication. Would like one sent to [facility]. Please advise.

## 2015-12-05 DIAGNOSIS — N3946 Mixed incontinence: Secondary | ICD-10-CM | POA: Diagnosis not present

## 2015-12-05 DIAGNOSIS — R32 Unspecified urinary incontinence: Secondary | ICD-10-CM | POA: Diagnosis not present

## 2015-12-05 DIAGNOSIS — Z87440 Personal history of urinary (tract) infections: Secondary | ICD-10-CM | POA: Diagnosis not present

## 2015-12-11 ENCOUNTER — Encounter: Payer: Self-pay | Admitting: Cardiovascular Disease

## 2015-12-11 ENCOUNTER — Ambulatory Visit (INDEPENDENT_AMBULATORY_CARE_PROVIDER_SITE_OTHER): Payer: Medicare Other | Admitting: Cardiovascular Disease

## 2015-12-11 VITALS — BP 96/60 | HR 68 | Ht 65.0 in | Wt 111.8 lb

## 2015-12-11 DIAGNOSIS — I340 Nonrheumatic mitral (valve) insufficiency: Secondary | ICD-10-CM

## 2015-12-11 DIAGNOSIS — R0789 Other chest pain: Secondary | ICD-10-CM

## 2015-12-11 DIAGNOSIS — R002 Palpitations: Secondary | ICD-10-CM

## 2015-12-11 DIAGNOSIS — R079 Chest pain, unspecified: Secondary | ICD-10-CM | POA: Insufficient documentation

## 2015-12-11 HISTORY — DX: Nonrheumatic mitral (valve) insufficiency: I34.0

## 2015-12-11 NOTE — Patient Instructions (Signed)
Medication Instructions:  Your physician recommends that you continue on your current medications as directed. Please refer to the Current Medication list given to you today.   Labwork: None Ordered   Testing/Procedures: None Ordered   Follow-Up: Your physician wants you to follow-up in: 1 year with Dr. Nahser.  You will receive a reminder letter in the mail two months in advance. If you don't receive a letter, please call our office to schedule the follow-up appointment.   If you need a refill on your cardiac medications before your next appointment, please call your pharmacy.   Thank you for choosing [facility]! Alder Murri, RN [phone]    

## 2015-12-11 NOTE — Progress Notes (Signed)
Kayla [name] Date of Birth  12/25/1945       [facility] Office [address], [address], [place], [place]  [address]   Kayla [name], Kayla [name]  [phone]     [phone]   Fax  [phone]    Fax [phone]  Problem List: 1. Mitral valve prolapse-mitral regurgitation 2. Hypertension 3. Barrett's esophagus 4. Fibromyalgia 5. History chest pain-she had a normal heart catheterization in 1993. Stress Myoview study in 2008 was normal. 6. Brain aneurism -  7. S/p Roux-en-Y surgery  for Barretts esophagus  History of Present Illness: Kayla [name] continues to have chest pain.  She takes PRN propranolol which seems to help.  It typically occurs in the afternoon.  Almost always with rest.  She does not get any regular exercise.  She has had lots of bladder infections due to kidney stones recently that have not permitted her to exercise.  She's had lots of problems with anxiety.  July 15, 2014:  Kayla [name] presents for evaluation Her BP has been very low - possibly due to the various medications .  She has had left sided chest pain for years.  Has had a normal cath in the remote past and had a normal myoview  Several years ago .   She was recently seen by Dr. Newman Pies for some palpitations The palpitations would come and go .  Off and on for hours.  Was started on atenolol which has helped the palpitations. She had taken atenolol for years ( many years ago) but was off atenolol for several years.  Her palpitations have resolved after starting the atenolol .    These episodes of tachycardia are at times associated with episodes of CP . Has lost a lot of weight over the past several years. She had a Roux -in - Y gastric bypass ( supposedly to prevent esophageal cancer from Barretts esophagutis. Is not able to eat much.  Can eat 1/4 cup of food at a time .    Sept. 19, 2016:  Continues to have palpitations  - improve with  propranolol Continues to have poor appetite.  Is generally very weak Continues to see her primary medical doctor and her pain management doctor .   Nov. 27,2017:  Seems to be doing well from a cardiac standpoint. Having urology issues.  May need a bladder tack.  Current Outpatient Prescriptions on File Prior to Visit  Medication Sig Dispense Refill  . Ascorbic Acid (VITAMIN C) 1000 MG tablet Take 1,000 mg by mouth daily.    . beta carotene w/minerals (OCUVITE) tablet Take 1 tablet by mouth 2 (two) times daily.    Marland Kitchen CALCIUM-MAGNESIUM-ZINC PO Take by mouth daily.    . celecoxib (CELEBREX) 200 MG capsule Take 1 capsule (200 mg total) by mouth daily as needed. 90 capsule 1  . cetirizine (ZYRTEC) 10 MG tablet Take 10 mg by mouth daily.    . Cholecalciferol (VITAMIN D3) 2000 UNITS TABS Take 2,000 Units by mouth daily.    . clonazePAM (KLONOPIN) 1 MG tablet Take 0.5-1 tablets (0.5-1 mg total) by mouth 2 (two) times daily as needed. anxiety/panic attacks 180 tablet 1  . Cyanocobalamin (VITAMIN B-12) 1000 MCG SUBL Place 5,000 mcg under the tongue daily.     . cyclobenzaprine (FLEXERIL) 10 MG tablet Take 1 tablet (10 mg total) by mouth 3 (three) times daily as needed for muscle spasms. 90 tablet 1  .  diclofenac sodium (VOLTAREN) 1 % GEL Apply 2 g topically 4 (four) times daily. 700 g 1  . dicyclomine (BENTYL) 10 MG capsule Take 1-2 tab 3 times daily AC as needed for spasms and cramping. 270 capsule 1  . diltiazem 2 % GEL Apply 1 application topically 2 (two) times daily. 30 g 0  . docusate sodium (COLACE) 250 MG capsule Take 250 mg by mouth daily.    . DULoxetine (CYMBALTA) 60 MG capsule Take 1 capsule (60 mg total) by mouth daily. 90 capsule 1  . esomeprazole (NEXIUM) 40 MG capsule Take 1 capsule (40 mg total) by mouth daily before breakfast. 90 capsule 1  . fentaNYL (DURAGESIC - DOSED MCG/HR) 12 MCG/HR Place 12.5 mcg onto the skin every 3 (three) days.    . fentaNYL (DURAGESIC - DOSED MCG/HR) 50  MCG/HR Place 50 mcg onto the skin every 3 (three) days.    . fluconazole (DIFLUCAN) 150 MG tablet Take 1 tablet (150 mg total) by mouth once. Repeat in 72 hours if needed 2 tablet 0  . fluorouracil (EFUDEX) 5 % cream Apply 1 application topically 2 (two) times daily. Reported on 03/08/2015    . gabapentin (NEURONTIN) 300 MG capsule Take 1 capsule (300 mg total) by mouth 2 (two) times daily. 60 capsule 2  . hypromellose (SYSTANE OVERNIGHT THERAPY) 0.3 % GEL ophthalmic ointment Place 1 drop into both eyes at bedtime. Reported on 02/11/2015    . ketoconazole (NIZORAL) 2 % cream Apply 1 application topically daily. Under breasts 15 g 0  . lidocaine (LIDODERM) 5 % Place 1 patch onto the skin as needed. Remove & Discard patch within 12 hours. May dispense as 3 month supply 90 patch 3  . Loteprednol Etabonate (LOTEMAX) 0.5 % GEL Place 1 drop into both eyes 2 (two) times daily. Reported on 02/11/2015    . Meth-Hyo-M Bl-Na Phos-Ph Sal (URIBEL) 118 MG CAPS Take 1 capsule (118 mg total) by mouth 4 (four) times daily as needed. For irritative bladder symptoms 120 capsule 0  . mirabegron ER (MYRBETRIQ) 25 MG TB24 tablet Take 1 tablet (25 mg total) by mouth daily. 30 tablet 2  . Omega-3 Fatty Acids (SEA-OMEGA PO) Take 1,000 mg by mouth daily.    . polyethylene glycol powder (GLYCOLAX/MIRALAX) powder Take 17 g by mouth 2 (two) times daily. Reported on 03/08/2015 850 g 1  . predniSONE (DELTASONE) 10 MG tablet Take 6tabs x1day, then 5tabs x1day, then 4tabs x1day, then 3tabs x1day, then 2tabs x1day, then 1tab x1day, then STOP 21 tablet 0  . propranolol (INDERAL) 10 MG tablet Take 1 tablet (10 mg total) by mouth 4 (four) times daily. 360 tablet 1  . pyridoxine (B-6) 200 MG tablet Take 200 mg by mouth daily.    . sucralfate (CARAFATE) 1 GM/10ML suspension Take 10 mLs (1 g total) by mouth 4 (four) times daily -  with meals and at bedtime. 420 mL 3  . SUMAtriptan (IMITREX) 100 MG tablet Take 1 tablet at earliest onset of  headache, may repeat in 2 hours if headache persists or reoccurs. 30 tablet 1  . traZODone (DESYREL) 100 MG tablet Take 0.5-1 tablets (50-100 mg total) by mouth at bedtime as needed for sleep. And 1/2 -1 if wakes at 4am. 90 tablet 1  . triamcinolone (NASACORT) 55 MCG/ACT AERO nasal inhaler Place 2 sprays into the nose daily. 3 Inhaler 1  . zoster vaccine live, PF, (ZOSTAVAX) 16109 UNT/0.65ML injection Inject 19,400 Units into the skin once. Administer at  pharmacy 1 each 0  . [DISCONTINUED] buPROPion (WELLBUTRIN) 75 MG tablet Take 75 mg by mouth daily after breakfast.      No current facility-administered medications on file prior to visit.     Allergies  Allergen Reactions  . Ambien [Zolpidem Tartrate]     Hallucinations  . Codeine Anaphylaxis  . Oxycodone-Acetaminophen Shortness Of Breath  . Tylox [Oxycodone-Acetaminophen] Anaphylaxis    Chest pain  . Darvocet [Propoxyphene N-Acetaminophen]     Chest pain  . Darvon [Propoxyphene] Itching  . Food Swelling    Peanuts-wheat-  . Lorcet [Hydrocodone-Acetaminophen]     Chest pain  . Opium     hallucinations  . Vicodin [Hydrocodone-Acetaminophen] Other (See Comments)    Chest Pain   . Wheat Bran   . Zolpidem Tartrate Other (See Comments)    Makes her go crazy  . Zolpidem Tartrate     hallucinations  . Amoxicillin Nausea And Vomiting    Reaction:  Migraine headache  . Penicillins Nausea And Vomiting    Migraine Headaches    Past Medical History:  Diagnosis Date  . Allergy   . Anemia    anemia in the past  . Angina    takes Propanolol prn and it stops her chest  . Anxiety   . Barrett esophagus    not consistently present  . Cataract   . Chronic kidney disease    kidney stones and UTI  . Complication of anesthesia    either  BP or pulse dropped with last surgery  . DDD (degenerative disc disease)    cervical and lumbar  . Dementia   . Depression    post traumatic stress  disorder  . Diabetes mellitus    sugar goes  up and down diet controlled  . Dysrhythmia    brought on by stress  . Fatigue   . Fibromyalgia    neuropathy knees ankles and toes, bladder  . GERD (gastroesophageal reflux disease)   . H/O hyperparathyroidism   . Headache   . Headache(784.0)    migraines  . Hiatal hernia   . History of kidney stones   . Hypertension    3 small brain aneurysms  . IBS (irritable bowel syndrome)   . Macular degeneration   . Migraines   . Osteoarthritis    all over  . Osteopetrosis   . Osteoporosis   . Peripheral vascular disease (HCC)    superficial phlebitis in left calf  . Personal history of colonic polyps 07/22/2013   07/2013 - 3 small adenomas - repeat colonoscopy 2018    Past Surgical History:  Procedure Laterality Date  . ABDOMINAL HYSTERECTOMY    . APPENDECTOMY    . CARDIAC CATHETERIZATION  03/22/1991   EF 61%  . CARDIOVASCULAR STRESS TEST  10/03/2006  . CHOLECYSTECTOMY    . COLONOSCOPY  06/23/2008   normal  . DILATION AND CURETTAGE OF UTERUS     x2  . ESOPHAGUS SURGERY    . EXPLORATORY LAPAROTOMY  1993  . GASTRIC BYPASS  1999  . GASTRIC RESTRICTION SURGERY  1991  . Right Arm Surgery    . Right Knee Arthroscopy    . Rt wrist fx  2009  . stomach stappeling  1991  . STONE EXTRACTION WITH BASKET  2012  . TONSILLECTOMY    . TUBAL LIGATION    . UPPER GASTROINTESTINAL ENDOSCOPY  06/23/2008   w/Dil, Barrett's esophagus  . US ECHOCARDIOGRAPHY  01/21/2007   EF 55-60%  . US  ECHOCARDIOGRAPHY  08/30/2003   EF 55-60%    History  Smoking Status  . Never Smoker  Smokeless Tobacco  . Never Used    History  Alcohol Use No    Family History  Problem Relation Age of Onset  . Stroke Mother   . Lung cancer Mother   . Heart disease Mother   . Diabetes Mother   . Hypertension Father   . Stroke Father   . Lung cancer Father   . Heart disease Father   . Kidney disease Father   . Seizures Father     epilepsy, and sisters x 2  . Pancreatic cancer Sister   . Lung cancer Sister   .  Colon cancer Maternal Aunt     12 relatives  . Uterine cancer      aunt  . Heart disease      grandmother  . Clotting disorder Sister   . Heart disease Sister     x 2  . Kidney disease Sister     x 2  . Breast cancer Sister   . Colon cancer Paternal Aunt   . Colon cancer Paternal Aunt     Reviw of Systems:  Reviewed in the HPI.  All other systems are negative.  Physical Exam: Blood pressure 96/60, pulse 68, height 5\' 5"  (1.651 m), weight 111 lb 12.8 oz (50.7 kg). General: Well developed, well nourished, in no acute distress.  Head: Normocephalic, atraumatic, sclera non-icteric, mucus membranes are moist,   Neck: Supple. Carotids are 2 + without bruits. No JVD  Lungs: Clear bilaterally to auscultation.  Heart: regular rate.  normal  S1 S2. She has a very soft systolic murmur.   Abdomen: Soft, non-tender, non-distended with normal bowel sounds. No hepatomegaly. No rebound/guarding. No masses.  Msk:  Strength and tone are normal  Extremities: No clubbing or cyanosis. No edema.  Distal pedal pulses are 2+ and equal bilaterally.  Neuro: Alert and oriented X 3. Moves all extremities spontaneously.  Psych:  Responds to questions appropriately with a normal affect.  ECG: Nov. 27, 2017:   NSR at 70.   Normal ECG    Assessment / Plan:   1.  Chest pain: The patient is a episodes of chest pain that are associated with her palpitations.  Has had a normal cath many years ago and has had 2 normal myoviews.  These episodes of chest pain are unchanged from her previous episodes. I do not think that she needs any additional workup. She's had them for many years. I've reassured her.  2. Palpitations:  Improved with propranolol. Continue to use PRN.   3.  Bladder issues.  May need to have a bladder tack. She is at low risk from a cardiac standpoint .    Mertie Moores, MD  12/11/2015 10:04 AM    [address],  [place], [place]   [address] Pager [phone] Phone: [phone]; Fax: [phone]

## 2015-12-13 DIAGNOSIS — M1288 Other specific arthropathies, not elsewhere classified, other specified site: Secondary | ICD-10-CM | POA: Diagnosis not present

## 2015-12-13 DIAGNOSIS — R51 Headache: Secondary | ICD-10-CM | POA: Diagnosis not present

## 2015-12-13 DIAGNOSIS — G894 Chronic pain syndrome: Secondary | ICD-10-CM | POA: Diagnosis not present

## 2015-12-13 DIAGNOSIS — M797 Fibromyalgia: Secondary | ICD-10-CM | POA: Diagnosis not present

## 2015-12-19 DIAGNOSIS — R5383 Other fatigue: Secondary | ICD-10-CM | POA: Diagnosis not present

## 2015-12-19 DIAGNOSIS — Z800 Family history of malignant neoplasm of digestive organs: Secondary | ICD-10-CM | POA: Diagnosis not present

## 2015-12-19 DIAGNOSIS — R140 Abdominal distension (gaseous): Secondary | ICD-10-CM | POA: Diagnosis not present

## 2015-12-19 DIAGNOSIS — M6289 Other specified disorders of muscle: Secondary | ICD-10-CM | POA: Diagnosis not present

## 2015-12-29 DIAGNOSIS — Z87440 Personal history of urinary (tract) infections: Secondary | ICD-10-CM | POA: Diagnosis not present

## 2015-12-29 DIAGNOSIS — N3946 Mixed incontinence: Secondary | ICD-10-CM | POA: Diagnosis not present

## 2016-01-18 DIAGNOSIS — H16103 Unspecified superficial keratitis, bilateral: Secondary | ICD-10-CM | POA: Diagnosis not present

## 2016-01-18 DIAGNOSIS — H40023 Open angle with borderline findings, high risk, bilateral: Secondary | ICD-10-CM | POA: Diagnosis not present

## 2016-01-18 DIAGNOSIS — Z961 Presence of intraocular lens: Secondary | ICD-10-CM | POA: Diagnosis not present

## 2016-01-18 DIAGNOSIS — H02202 Unspecified lagophthalmos right lower eyelid: Secondary | ICD-10-CM | POA: Diagnosis not present

## 2016-01-18 DIAGNOSIS — H353131 Nonexudative age-related macular degeneration, bilateral, early dry stage: Secondary | ICD-10-CM | POA: Diagnosis not present

## 2016-01-18 DIAGNOSIS — H02204 Unspecified lagophthalmos left upper eyelid: Secondary | ICD-10-CM | POA: Diagnosis not present

## 2016-01-18 DIAGNOSIS — H04123 Dry eye syndrome of bilateral lacrimal glands: Secondary | ICD-10-CM | POA: Diagnosis not present

## 2016-01-18 DIAGNOSIS — H02205 Unspecified lagophthalmos left lower eyelid: Secondary | ICD-10-CM | POA: Diagnosis not present

## 2016-01-18 DIAGNOSIS — H02201 Unspecified lagophthalmos right upper eyelid: Secondary | ICD-10-CM | POA: Diagnosis not present

## 2016-01-22 ENCOUNTER — Encounter: Payer: Self-pay | Admitting: Family Medicine

## 2016-01-22 ENCOUNTER — Ambulatory Visit (INDEPENDENT_AMBULATORY_CARE_PROVIDER_SITE_OTHER): Payer: Medicare Other | Admitting: Family Medicine

## 2016-01-22 VITALS — BP 97/57 | HR 81 | Temp 98.8°F | Resp 16 | Ht 65.5 in | Wt 112.0 lb

## 2016-01-22 DIAGNOSIS — K5909 Other constipation: Secondary | ICD-10-CM | POA: Diagnosis not present

## 2016-01-22 DIAGNOSIS — I10 Essential (primary) hypertension: Secondary | ICD-10-CM | POA: Diagnosis not present

## 2016-01-22 DIAGNOSIS — R7309 Other abnormal glucose: Secondary | ICD-10-CM | POA: Diagnosis not present

## 2016-01-22 DIAGNOSIS — N3010 Interstitial cystitis (chronic) without hematuria: Secondary | ICD-10-CM

## 2016-01-22 DIAGNOSIS — Z130 Encounter for screening for diseases of the blood and blood-forming organs and certain disorders involving the immune mechanism: Secondary | ICD-10-CM

## 2016-01-22 DIAGNOSIS — Z1329 Encounter for screening for other suspected endocrine disorder: Secondary | ICD-10-CM | POA: Diagnosis not present

## 2016-01-22 DIAGNOSIS — Z23 Encounter for immunization: Secondary | ICD-10-CM | POA: Diagnosis not present

## 2016-01-22 DIAGNOSIS — R519 Headache, unspecified: Secondary | ICD-10-CM

## 2016-01-22 DIAGNOSIS — H547 Unspecified visual loss: Secondary | ICD-10-CM

## 2016-01-22 DIAGNOSIS — F411 Generalized anxiety disorder: Secondary | ICD-10-CM

## 2016-01-22 DIAGNOSIS — Z136 Encounter for screening for cardiovascular disorders: Secondary | ICD-10-CM

## 2016-01-22 DIAGNOSIS — G8929 Other chronic pain: Secondary | ICD-10-CM

## 2016-01-22 DIAGNOSIS — Z8639 Personal history of other endocrine, nutritional and metabolic disease: Secondary | ICD-10-CM

## 2016-01-22 DIAGNOSIS — I739 Peripheral vascular disease, unspecified: Secondary | ICD-10-CM | POA: Insufficient documentation

## 2016-01-22 DIAGNOSIS — M1990 Unspecified osteoarthritis, unspecified site: Secondary | ICD-10-CM

## 2016-01-22 DIAGNOSIS — R109 Unspecified abdominal pain: Secondary | ICD-10-CM

## 2016-01-22 DIAGNOSIS — Z1231 Encounter for screening mammogram for malignant neoplasm of breast: Secondary | ICD-10-CM | POA: Diagnosis not present

## 2016-01-22 DIAGNOSIS — Z113 Encounter for screening for infections with a predominantly sexual mode of transmission: Secondary | ICD-10-CM | POA: Diagnosis not present

## 2016-01-22 DIAGNOSIS — L987 Excessive and redundant skin and subcutaneous tissue: Secondary | ICD-10-CM

## 2016-01-22 DIAGNOSIS — Z9889 Other specified postprocedural states: Secondary | ICD-10-CM

## 2016-01-22 DIAGNOSIS — R7303 Prediabetes: Secondary | ICD-10-CM | POA: Insufficient documentation

## 2016-01-22 DIAGNOSIS — R51 Headache: Secondary | ICD-10-CM | POA: Diagnosis not present

## 2016-01-22 DIAGNOSIS — M797 Fibromyalgia: Secondary | ICD-10-CM

## 2016-01-22 DIAGNOSIS — F329 Major depressive disorder, single episode, unspecified: Secondary | ICD-10-CM

## 2016-01-22 DIAGNOSIS — Z9884 Bariatric surgery status: Secondary | ICD-10-CM

## 2016-01-22 DIAGNOSIS — M810 Age-related osteoporosis without current pathological fracture: Secondary | ICD-10-CM | POA: Diagnosis not present

## 2016-01-22 DIAGNOSIS — Z1383 Encounter for screening for respiratory disorder NEC: Secondary | ICD-10-CM

## 2016-01-22 DIAGNOSIS — Z1389 Encounter for screening for other disorder: Secondary | ICD-10-CM

## 2016-01-22 DIAGNOSIS — Z1212 Encounter for screening for malignant neoplasm of rectum: Secondary | ICD-10-CM

## 2016-01-22 DIAGNOSIS — Z1211 Encounter for screening for malignant neoplasm of colon: Secondary | ICD-10-CM

## 2016-01-22 DIAGNOSIS — E559 Vitamin D deficiency, unspecified: Secondary | ICD-10-CM

## 2016-01-22 HISTORY — DX: Personal history of other endocrine, nutritional and metabolic disease: Z86.39

## 2016-01-22 LAB — POCT URINALYSIS DIP (MANUAL ENTRY)
Bilirubin, UA: NEGATIVE
Blood, UA: NEGATIVE
GLUCOSE UA: NEGATIVE
Ketones, POC UA: NEGATIVE
LEUKOCYTES UA: NEGATIVE
Nitrite, UA: NEGATIVE
Protein Ur, POC: NEGATIVE
SPEC GRAV UA: 1.015
UROBILINOGEN UA: 1
pH, UA: 7.5

## 2016-01-22 NOTE — Patient Instructions (Signed)
You need to schedule you mammogram and your bone scan.  Please call the breast center at  [place] at [phone] to schedule

## 2016-01-22 NOTE — Progress Notes (Signed)
Subjective:   Chief Complaint  Patient presents with  . Annual Exam    pt would like some blood work done for lupus, A1C,EFR, hormone levels, and to test for breast cancer     Kayla [name] is a 71 y.o. female who presents for Medicare Annual/Subsequent preventive examination. We were unable to complete her Medicare exam today due to necessary review of her chronic medical conditions.  I will need to see Kayla [name] back in 6 weeks for medication refills. She is welcome to reschedule a date to complete her Medicare wellness exam at her convenience but M unable to do at the same time as a med refill. I met Kayla [name] several months prior.   Preventive Screening-Counseling & Management  Tobacco History  Smoking Status  . Never Smoker  Smokeless Tobacco  . Never Used     Problems Prior to Visit 1. Cystocele with rectocele - had been referred to urology in [facility] after she had prob scheduling with [facility] scheduling but [facility] was sched 3 mo out so tried [facility] and was seen by Dr. Felipa Eth 2.  New onset Diarrhea > 8 mos, IBS - pt was supposed to follow-up with Dr. Cyndi Bender [facility] of the [place]. Prn bentyl. Nexium for GERD 3.  Chronic interstitial cystitis with hematuria - last visit we increased her vesicare from 5 to '10mg'$  but this wasn't helping so we tried myrbetriq instead which didn't help.  Azo did not help so we tried pt on uribel.  Has to wear depends and a Kotex so going to get a bladder shock.  4.  Fibromyalgia: Followed by Dr. ____. She was weaning off fetanyl 50 mcg/hr patches since her prior pain management office with Dr. Vilinda Flake closed. Hydrocodone had caused chest pain. Voltaren gel helps. Also on celebrex and flexeril. 5.  Chronic migraine: Also with intermittent sharp HAs daily requiring fioricet every 6 hours to just dull the HAs, HAs did not improve when she went off of the Fioricet for several months. MRI 2017. She was referred to Dr. Charlann Boxer at [place] for this by [facility] neurologist Dr. Tomi Likens 6.  Chest pain - was caused by hydrocodone. Follows with cardiology Dr. Acie Fredrickson on propranolol qid which pt takes prn (about 2x/d with CP). 7.  Mood d/o With a history of anxiety, social isolation, agoraphobia, insomnia, and depression - klonopin 0.5-'1mg'$  bid, cymbalta 60 (increased from 30 at her last visit), and weaned down on trazodone to '100mg'$ . Following with therapist at [place]. Pt has panic Attacks and anxiety from working with her daughter who has anger management issues. When patient's daughter lives with her, patient's use of mood medication increases substantially.  I rx'ed pt a 6 mo supply of klonopin 09/27/15 so will be due for a refill 03/27/15. 8.  Osteoporosis: Last DEXA scan 2007. I do not see results in chart. Also noted history of hyperparathyroidism. She states she stopped her fosamax at some point as she was trying to get off of that. She thinks that perhaps she restarted it several months ago by her pain physician. 9.  History of diabetes mellitus. I suspect that this predated her gastric bypass. However there is no hemoglobin A1c on chart. Highest CBG was 108 in 2014.  She does check her blood sugar at home and her sugar will vary between 40 and 560 and she has symptomatic episodes where she can't tell if she is to high and to low.  That will happen  3-4x/mo but worked closely with a dietician and endocrine who are pushing her to eat more carbs and sugar to gain weight but this is exacerbating these sudden episodes of hyperglycemia.   10.  Worried about vaginal dryness and dyspareunia.  She had a total complete hysterectomy and was wondering about screening for vaginal cancer.  However, she is unwilling to do any hormonal bloodwork. 11.  She was told to get the macular degeneration and glaucoma was worsening.  SEvere chornic dry eye, seen by Arnell Sieving PA at [place] 3d prior and told to get inflammatory levels.  Sees cancer  geneticist tomorrow.  Refer to rheum Very complimentary of husband.   Current Problems (verified) Patient Active Problem List   Diagnosis Date Noted  . Chest pain 12/11/2015  . Mitral regurgitation 12/11/2015  . Vitamin D deficiency 03/08/2015  . H/O gastric bypass 02/11/2014  . Chronic maxillary sinusitis 09/15/2013  . Personal history of colonic polyps 07/22/2013  . HA (headache)-chronic/tension type 07/28/2012  . Osteoporosis 07/28/2012  . Unspecified vitamin D deficiency-2009 07/28/2012  . Nephrolithiasis 07/28/2012  . Fibromyalgia 07/26/2012  . Chronic interstitial cystitis 10/16/2011  . Chronic constipation 10/04/2010  . WEIGHT LOSS 11/27/2009  . PERSONAL HISTORY OF FAILED MODERATE SEDATION 06/01/2008  . Anxiety state 03/18/2007  . Reactive depression 03/18/2007  . GERD 03/18/2007  . Diaphragmatic hernia 03/18/2007  . Osteoarthritis 03/18/2007  . [place] DISEASE, CERVICAL 03/18/2007  . [place] DISEASE, LUMBAR 03/18/2007  . Myalgia and myositis 03/18/2007    Medications Prior to Visit Current Outpatient Prescriptions on File Prior to Visit  Medication Sig Dispense Refill  . Ascorbic Acid (VITAMIN C) 1000 MG tablet Take 1,000 mg by mouth daily.    . beta carotene w/minerals (OCUVITE) tablet Take 1 tablet by mouth 2 (two) times daily.    Marland Kitchen CALCIUM-MAGNESIUM-ZINC PO Take by mouth daily.    . celecoxib (CELEBREX) 200 MG capsule Take 1 capsule (200 mg total) by mouth daily as needed. 90 capsule 1  . cetirizine (ZYRTEC) 10 MG tablet Take 10 mg by mouth daily.    . Cholecalciferol (VITAMIN D3) 2000 UNITS TABS Take 2,000 Units by mouth daily.    . clonazePAM (KLONOPIN) 1 MG tablet Take 0.5-1 tablets (0.5-1 mg total) by mouth 2 (two) times daily as needed. anxiety/panic attacks 180 tablet 1  . Cyanocobalamin (VITAMIN B-12) 1000 MCG SUBL Place 5,000 mcg under the tongue daily.     . cyclobenzaprine (FLEXERIL) 10 MG tablet Take 1 tablet (10 mg total) by mouth 3 (three) times daily as  needed for muscle spasms. 90 tablet 1  . diclofenac sodium (VOLTAREN) 1 % GEL Apply 2 g topically 4 (four) times daily. 700 g 1  . dicyclomine (BENTYL) 10 MG capsule Take 1-2 tab 3 times daily AC as needed for spasms and cramping. 270 capsule 1  . diltiazem 2 % GEL Apply 1 application topically 2 (two) times daily. 30 g 0  . docusate sodium (COLACE) 250 MG capsule Take 250 mg by mouth daily.    . DULoxetine (CYMBALTA) 60 MG capsule Take 1 capsule (60 mg total) by mouth daily. 90 capsule 1  . esomeprazole (NEXIUM) 40 MG capsule Take 1 capsule (40 mg total) by mouth daily before breakfast. 90 capsule 1  . fentaNYL (DURAGESIC - DOSED MCG/HR) 12 MCG/HR Place 12.5 mcg onto the skin every 3 (three) days.    . fentaNYL (DURAGESIC - DOSED MCG/HR) 50 MCG/HR Place 50 mcg onto the skin every 3 (three) days.    Marland Kitchen  fluconazole (DIFLUCAN) 150 MG tablet Take 1 tablet (150 mg total) by mouth once. Repeat in 72 hours if needed 2 tablet 0  . fluorouracil (EFUDEX) 5 % cream Apply 1 application topically 2 (two) times daily. Reported on 03/08/2015    . gabapentin (NEURONTIN) 300 MG capsule Take 1 capsule (300 mg total) by mouth 2 (two) times daily. 60 capsule 2  . hypromellose (SYSTANE OVERNIGHT THERAPY) 0.3 % GEL ophthalmic ointment Place 1 drop into both eyes at bedtime. Reported on 02/11/2015    . ketoconazole (NIZORAL) 2 % cream Apply 1 application topically daily. Under breasts 15 g 0  . lidocaine (LIDODERM) 5 % Place 1 patch onto the skin as needed. Remove & Discard patch within 12 hours. May dispense as 3 month supply 90 patch 3  . Loteprednol Etabonate (LOTEMAX) 0.5 % GEL Place 1 drop into both eyes 2 (two) times daily. Reported on 02/11/2015    . Meth-Hyo-M Bl-Na Phos-Ph Sal (URIBEL) 118 MG CAPS Take 1 capsule (118 mg total) by mouth 4 (four) times daily as needed. For irritative bladder symptoms 120 capsule 0  . mirabegron ER (MYRBETRIQ) 25 MG TB24 tablet Take 1 tablet (25 mg total) by mouth daily. 30 tablet 2   . Omega-3 Fatty Acids (SEA-OMEGA PO) Take 1,000 mg by mouth daily.    . polyethylene glycol powder (GLYCOLAX/MIRALAX) powder Take 17 g by mouth 2 (two) times daily. Reported on 03/08/2015 850 g 1  . predniSONE (DELTASONE) 10 MG tablet Take 6tabs x1day, then 5tabs x1day, then 4tabs x1day, then 3tabs x1day, then 2tabs x1day, then 1tab x1day, then STOP 21 tablet 0  . propranolol (INDERAL) 10 MG tablet Take 1 tablet (10 mg total) by mouth 4 (four) times daily. 360 tablet 1  . pyridoxine (B-6) 200 MG tablet Take 200 mg by mouth daily.    . sucralfate (CARAFATE) 1 GM/10ML suspension Take 10 mLs (1 g total) by mouth 4 (four) times daily -  with meals and at bedtime. 420 mL 3  . SUMAtriptan (IMITREX) 100 MG tablet Take 1 tablet at earliest onset of headache, may repeat in 2 hours if headache persists or reoccurs. 30 tablet 1  . traZODone (DESYREL) 100 MG tablet Take 0.5-1 tablets (50-100 mg total) by mouth at bedtime as needed for sleep. And 1/2 -1 if wakes at 4am. 90 tablet 1  . triamcinolone (NASACORT) 55 MCG/ACT AERO nasal inhaler Place 2 sprays into the nose daily. 3 Inhaler 1  . zoster vaccine live, PF, (ZOSTAVAX) 53664 UNT/0.65ML injection Inject 19,400 Units into the skin once. Administer at pharmacy 1 each 0  . [DISCONTINUED] buPROPion (WELLBUTRIN) 75 MG tablet Take 75 mg by mouth daily after breakfast.      No current facility-administered medications on file prior to visit.     Current Medications (verified) Current Outpatient Prescriptions  Medication Sig Dispense Refill  . Ascorbic Acid (VITAMIN C) 1000 MG tablet Take 1,000 mg by mouth daily.    . beta carotene w/minerals (OCUVITE) tablet Take 1 tablet by mouth 2 (two) times daily.    Marland Kitchen CALCIUM-MAGNESIUM-ZINC PO Take by mouth daily.    . celecoxib (CELEBREX) 200 MG capsule Take 1 capsule (200 mg total) by mouth daily as needed. 90 capsule 1  . cetirizine (ZYRTEC) 10 MG tablet Take 10 mg by mouth daily.    . Cholecalciferol (VITAMIN D3)  2000 UNITS TABS Take 2,000 Units by mouth daily.    . clonazePAM (KLONOPIN) 1 MG tablet Take 0.5-1 tablets (0.5-1  mg total) by mouth 2 (two) times daily as needed. anxiety/panic attacks 180 tablet 1  . Cyanocobalamin (VITAMIN B-12) 1000 MCG SUBL Place 5,000 mcg under the tongue daily.     . cyclobenzaprine (FLEXERIL) 10 MG tablet Take 1 tablet (10 mg total) by mouth 3 (three) times daily as needed for muscle spasms. 90 tablet 1  . diclofenac sodium (VOLTAREN) 1 % GEL Apply 2 g topically 4 (four) times daily. 700 g 1  . dicyclomine (BENTYL) 10 MG capsule Take 1-2 tab 3 times daily AC as needed for spasms and cramping. 270 capsule 1  . diltiazem 2 % GEL Apply 1 application topically 2 (two) times daily. 30 g 0  . docusate sodium (COLACE) 250 MG capsule Take 250 mg by mouth daily.    . DULoxetine (CYMBALTA) 60 MG capsule Take 1 capsule (60 mg total) by mouth daily. 90 capsule 1  . esomeprazole (NEXIUM) 40 MG capsule Take 1 capsule (40 mg total) by mouth daily before breakfast. 90 capsule 1  . fentaNYL (DURAGESIC - DOSED MCG/HR) 12 MCG/HR Place 12.5 mcg onto the skin every 3 (three) days.    . fentaNYL (DURAGESIC - DOSED MCG/HR) 50 MCG/HR Place 50 mcg onto the skin every 3 (three) days.    . fluconazole (DIFLUCAN) 150 MG tablet Take 1 tablet (150 mg total) by mouth once. Repeat in 72 hours if needed 2 tablet 0  . fluorouracil (EFUDEX) 5 % cream Apply 1 application topically 2 (two) times daily. Reported on 03/08/2015    . gabapentin (NEURONTIN) 300 MG capsule Take 1 capsule (300 mg total) by mouth 2 (two) times daily. 60 capsule 2  . hypromellose (SYSTANE OVERNIGHT THERAPY) 0.3 % GEL ophthalmic ointment Place 1 drop into both eyes at bedtime. Reported on 02/11/2015    . ketoconazole (NIZORAL) 2 % cream Apply 1 application topically daily. Under breasts 15 g 0  . lidocaine (LIDODERM) 5 % Place 1 patch onto the skin as needed. Remove & Discard patch within 12 hours. May dispense as 3 month supply 90 patch 3   . Loteprednol Etabonate (LOTEMAX) 0.5 % GEL Place 1 drop into both eyes 2 (two) times daily. Reported on 02/11/2015    . Meth-Hyo-M Bl-Na Phos-Ph Sal (URIBEL) 118 MG CAPS Take 1 capsule (118 mg total) by mouth 4 (four) times daily as needed. For irritative bladder symptoms 120 capsule 0  . mirabegron ER (MYRBETRIQ) 25 MG TB24 tablet Take 1 tablet (25 mg total) by mouth daily. 30 tablet 2  . Omega-3 Fatty Acids (SEA-OMEGA PO) Take 1,000 mg by mouth daily.    . polyethylene glycol powder (GLYCOLAX/MIRALAX) powder Take 17 g by mouth 2 (two) times daily. Reported on 03/08/2015 850 g 1  . predniSONE (DELTASONE) 10 MG tablet Take 6tabs x1day, then 5tabs x1day, then 4tabs x1day, then 3tabs x1day, then 2tabs x1day, then 1tab x1day, then STOP 21 tablet 0  . propranolol (INDERAL) 10 MG tablet Take 1 tablet (10 mg total) by mouth 4 (four) times daily. 360 tablet 1  . pyridoxine (B-6) 200 MG tablet Take 200 mg by mouth daily.    . sucralfate (CARAFATE) 1 GM/10ML suspension Take 10 mLs (1 g total) by mouth 4 (four) times daily -  with meals and at bedtime. 420 mL 3  . SUMAtriptan (IMITREX) 100 MG tablet Take 1 tablet at earliest onset of headache, may repeat in 2 hours if headache persists or reoccurs. 30 tablet 1  . traZODone (DESYREL) 100 MG tablet Take  0.5-1 tablets (50-100 mg total) by mouth at bedtime as needed for sleep. And 1/2 -1 if wakes at 4am. 90 tablet 1  . triamcinolone (NASACORT) 55 MCG/ACT AERO nasal inhaler Place 2 sprays into the nose daily. 3 Inhaler 1  . zoster vaccine live, PF, (ZOSTAVAX) 09326 UNT/0.65ML injection Inject 19,400 Units into the skin once. Administer at pharmacy 1 each 0   No current facility-administered medications for this visit.      Allergies (verified) Ambien [zolpidem tartrate]; Codeine; Oxycodone-acetaminophen; Tylox [oxycodone-acetaminophen]; Darvocet [propoxyphene n-acetaminophen]; Darvon [propoxyphene]; Food; Lorcet [hydrocodone-acetaminophen]; Opium; Vicodin  [hydrocodone-acetaminophen]; Wheat bran; Zolpidem tartrate; Zolpidem tartrate; Amoxicillin; and Penicillins   PAST HISTORY  Family History Family History  Problem Relation Age of Onset  . Stroke Mother   . Lung cancer Mother   . Heart disease Mother   . Diabetes Mother   . Hypertension Father   . Stroke Father   . Lung cancer Father   . Heart disease Father   . Kidney disease Father   . Seizures Father     epilepsy, and sisters x 2  . Pancreatic cancer Sister   . Lung cancer Sister   . Colon cancer Maternal Aunt     12 relatives  . Uterine cancer      aunt  . Heart disease      grandmother  . Clotting disorder Sister   . Heart disease Sister     x 2  . Kidney disease Sister     x 2  . Breast cancer Sister   . Colon cancer Paternal Aunt   . Colon cancer Paternal Aunt     Social History Social History  Substance Use Topics  . Smoking status: Never Smoker  . Smokeless tobacco: Never Used  . Alcohol use No     Are there smokers in your home (other than you)? No  Risk Factors Current exercise habits: Home exercise routine includes walking.  Dietary issues discussed: heart healthy   Cardiac risk factors: advanced age (older than 80 for men, 3 for women).  Depression Screen Depression screen [facility] 2/9 01/22/2016 11/24/2015 09/27/2015 03/08/2015 02/11/2015  Decreased Interest 0 0 0 3 0  Down, Depressed, Hopeless - 0 0 3 0  PHQ - 2 Score 0 0 0 6 0  Altered sleeping - - - 3 -  Tired, decreased energy - - - 3 -  Change in appetite - - - 3 -  Feeling bad or failure about yourself  - - - 3 -  Trouble concentrating - - - 3 -  Moving slowly or fidgety/restless - - - 3 -  Suicidal thoughts - - - 0 -  PHQ-9 Score - - - 24 -  Difficult doing work/chores - - - Very difficult -  Some recent data might be hidden     Activities of Daily Living In your present state of health, do you have any difficulty performing the following activities?:  Driving? No Managing money?   No Feeding yourself? No Getting from bed to chair? No Climbing a flight of stairs? No Preparing food and eating?: No Bathing or showering? No Getting dressed: No Getting to the toilet? No Using the toilet:No Moving around from place to place: No In the past year have you fallen or had a near fall?:No   Are you sexually active?  No  Do you have more than one partner?  No  Hearing Difficulties: No Do you often ask people to speak up or repeat themselves?  No Do you experience ringing or noises in your ears? No Do you have difficulty understanding soft or whispered voices? No   Do you feel that you have a problem with memory? No  Do you often misplace items? No  Do you feel safe at home?  Yes  Cognitive Testing  Alert? Yes  Normal Appearance?Yes  Oriented to person? Yes  Place? Yes   Time? Yes  Recall of three objects?  Yes  Can perform simple calculations? Yes  Displays appropriate judgment?Yes  Can read the correct time from a watch face?Yes   Advanced Directives have been discussed with the patient? Yes  List the Names of Other Physician/Practitioners you currently use: 1.  Dermatology Kayla [name] at [place] 2.  GI Dr. Liane Comber 3.  Pain - Mr. Rochele Pages 4.  Neuro - Dr. Loretta Plume 5.  Sports Med - Dr. Tamala Julian 6.  Urology - Dr. Felipa Eth 7.  Cardiology Dr. Acie Fredrickson  Indicate any recent Medical Services you may have received from other than [facility] providers in the past year (date may be approximate).  Immunization History  Administered Date(s) Administered  . Influenza Split 10/02/2011  . Influenza Whole 08/21/2007  . Influenza,inj,Quad PF,36+ Mos 12/15/2013, 11/23/2014, 09/27/2015  . Pneumococcal Conjugate-13 09/05/2014  . Pneumococcal Polysaccharide-23 10/02/2011, 02/08/2013  . Td 01/14/2006    Screening Tests Health Maintenance  Topic Date Due  . ZOSTAVAX  08/26/2007  . TETANUS/TDAP  01/15/2016  . MAMMOGRAM  09/25/2016  . COLONOSCOPY  03/06/2018  . INFLUENZA  VACCINE  Completed  . DEXA SCAN  Completed  . Hepatitis C Screening  Completed  . PNA vac Low Risk Adult  Completed    All answers were reviewed with the patient and necessary referrals were made:  SHAW,EVA, MD   01/22/2016   History reviewed: allergies, current medications, past family history, past medical history, past social history, past surgical history and problem list  Review of Systems Pertinent items are noted in HPI.    Objective:  BP (!) 97/57   Pulse 81   Temp 98.8 F (37.1 C) (Oral)   Resp 16   Ht 5' 5.5" (1.664 m)   Wt 112 lb (50.8 kg)   SpO2 98%   BMI 18.35 kg/m   Visual Acuity Screening   Right eye Left eye Both eyes  Without correction:     With correction: 20/20-1 20/20-1 20/20    BP (!) 97/57   Pulse 81   Temp 98.8 F (37.1 C) (Oral)   Resp 16   Ht 5' 5.5" (1.664 m)   Wt 112 lb (50.8 kg)   SpO2 98%   BMI 18.35 kg/m   General Appearance:    Alert, cooperative, no distress, appears stated age  Head:    Normocephalic, without obvious abnormality, atraumatic  Eyes:    PERRL, conjunctiva/corneas clear, EOM's intact, fundi    benign, both eyes  Ears:    Normal TM's and external ear canals, both ears  Nose:   Nares normal, septum midline, mucosa normal, no drainage    or sinus tenderness  Throat:   Lips, mucosa, and tongue normal; teeth and gums normal  Neck:   Supple, symmetrical, trachea midline, no adenopathy;    thyroid:  no enlargement/tenderness/nodules; no carotid   bruit or JVD  Back:     Symmetric, no curvature, ROM normal, no CVA tenderness  Lungs:     Clear to auscultation bilaterally, respirations unlabored  Chest Wall:    No tenderness or  deformity   Heart:    Regular rate and rhythm, S1 and S2 normal, no murmur, rub   or gallop  Breast Exam:    No tenderness, masses, or nipple abnormality  Abdomen:     Soft, non-tender, bowel sounds active all four quadrants,    no masses, no organomegaly  Genitalia:    Normal female without lesion,  discharge or tenderness  Rectal:    Normal tone, normal prostate, no masses or tenderness;   guaiac negative stool  Extremities:   Extremities normal, atraumatic, no cyanosis or edema  Pulses:   2+ and symmetric all extremities  Skin:   Skin color, texture, turgor normal, no rashes or lesions  Lymph nodes:   Cervical, supraclavicular, and axillary nodes normal  Neurologic:   CNII-XII intact, normal strength, sensation and reflexes    throughout       Assessment:     1. Osteoporosis without current pathological fracture, unspecified osteoporosis type   2. Routine screening for STI (sexually transmitted infection)   3. Encounter for screening mammogram for breast cancer   4. Screening for cardiovascular, respiratory, and genitourinary diseases   5. Screening for colorectal cancer   6. Screening for deficiency anemia   7. Screening for thyroid disorder   8. Peripheral vascular disease ([place])   9. Hypertension, unspecified type   10. Chronic constipation   11. Chronic interstitial cystitis   12. Osteoarthritis, unspecified osteoarthritis type, unspecified site   13. Vitamin D deficiency   14. Reactive depression   15. Fibromyalgia   16. H/O gastric bypass   17. H/O hyperparathyroidism   18. Chronic intractable headache, unspecified headache type   19. Anxiety state   20. Elevated glucose   21. Vision loss   22. Abdominal spasms   23. Excess skin         Plan:     During the course of the visit the patient was educated and counseled about appropriate screening and preventive services including:      Diet review for nutrition referral? Yes ____  Not Indicated x____  Need to do DM stuff at next visit: foot exam, optho exam,  Needs shingles vaccine, needs tetanus (2008),  Mammograms done annuallyh at the breast center in Sept but not done this past year (last 09/2014 nml) Colonoscopy done 02/2015 results scanned in (need to view DEXA 2007 - results not visible  Lipid,  ua, cmp, vitamin D (h/o def)  Patient Instructions (the written plan) was given to the patient.  We'll have patient return to clinic within 2 months for refills of her chronic medications. Cautious with the interaction of Cymbalta 60, Flexeril 10, trazodone 100 for serotonin OD.  Does bentyl, gabapentin, fioricet interact?   Orders Placed This Encounter  Procedures  . DG Bone Density    PF:  2009, PT DOESN'T REMEMBER WHERE/ PT TO STOP SUPP/ NO NEEDS/ EPIC ORDER INS:  MEDICARE & [facility] JTB/PT    Standing Status:   Future    Standing Expiration Date:   03/21/2017    Order Specific Question:   Reason for Exam (SYMPTOM  OR DIAGNOSIS REQUIRED)    Answer:   estrogen deficiency, h/o osteoporosis diagnosis    Order Specific Question:   Preferred imaging location?    Answer:   [facility]  . Tdap vaccine greater than or equal to 7yo IM  . CBC  . Comprehensive metabolic panel    Order Specific Question:   Has the patient fasted?  Answer:   Yes  . Lipid panel    Order Specific Question:   Has the patient fasted?    Answer:   Yes  . TSH  . VITAMIN D 25 Hydroxy (Vit-D Deficiency, Fractures)  . Hemoglobin A1c  . Sedimentation Rate  . C-reactive protein  . ANA w/Reflex if Positive  . Insulin and C-Peptide  . POCT urinalysis dipstick    Meds ordered this encounter  Medications  . Lifitegrast (XIIDRA) 5 % SOLN    Sig: Apply to eye.    [name], M.D.  [facility] [address] [place],  [address] [phone] phone [phone] fax  02/16/16 3:14 AM  Medicare Attestation I have personally reviewed: The patient's medical and social history Their use of alcohol, tobacco or illicit drugs Their current medications and supplements The patient's functional ability including ADLs,fall risks, home safety risks, cognitive, and hearing and visual impairment Diet and physical activities Evidence for depression or mood disorders  The  patient's weight, height, BMI, and visual acuity have been recorded in the chart.  I have made referrals, counseling, and provided education to the patient based on review of the above and I have provided the patient with a written personalized care plan for preventive services.     [name], MD   01/22/2016    NEEDS DEXA

## 2016-01-23 ENCOUNTER — Other Ambulatory Visit: Payer: Self-pay | Admitting: Family Medicine

## 2016-01-23 DIAGNOSIS — K635 Polyp of colon: Secondary | ICD-10-CM | POA: Diagnosis not present

## 2016-01-23 DIAGNOSIS — Z1231 Encounter for screening mammogram for malignant neoplasm of breast: Secondary | ICD-10-CM

## 2016-01-23 DIAGNOSIS — Z803 Family history of malignant neoplasm of breast: Secondary | ICD-10-CM | POA: Diagnosis not present

## 2016-01-23 DIAGNOSIS — Z800 Family history of malignant neoplasm of digestive organs: Secondary | ICD-10-CM | POA: Diagnosis not present

## 2016-01-23 LAB — LIPID PANEL
CHOL/HDL RATIO: 2.3 ratio (ref 0.0–4.4)
Cholesterol, Total: 221 mg/dL — ABNORMAL HIGH (ref 100–199)
HDL: 95 mg/dL (ref >39)
LDL Calculated: 108 mg/dL — ABNORMAL HIGH (ref 0–99)
Triglycerides: 88 mg/dL (ref 0–149)
VLDL Cholesterol Cal: 18 mg/dL (ref 5–40)

## 2016-01-23 LAB — HEMOGLOBIN A1C
Est. average glucose Bld gHb Est-mCnc: 105 mg/dL
HEMOGLOBIN A1C: 5.3 % (ref 4.8–5.6)

## 2016-01-23 LAB — COMPREHENSIVE METABOLIC PANEL
A/G RATIO: 2 (ref 1.2–2.2)
ALBUMIN: 4.9 g/dL — AB (ref 3.6–4.8)
ALT: 25 IU/L (ref 0–32)
AST: 27 IU/L (ref 0–40)
Alkaline Phosphatase: 123 IU/L — ABNORMAL HIGH (ref 39–117)
BILIRUBIN TOTAL: 0.3 mg/dL (ref 0.0–1.2)
BUN / CREAT RATIO: 35 — AB (ref 12–28)
BUN: 18 mg/dL (ref 8–27)
CHLORIDE: 98 mmol/L (ref 96–106)
CO2: 28 mmol/L (ref 18–29)
Calcium: 9.5 mg/dL (ref 8.7–10.3)
Creatinine, Ser: 0.52 mg/dL — ABNORMAL LOW (ref 0.57–1.00)
GFR calc Af Amer: 114 mL/min/{1.73_m2} (ref >59)
GFR, EST NON AFRICAN AMERICAN: 98 mL/min/{1.73_m2} (ref >59)
GLOBULIN, TOTAL: 2.4 g/dL (ref 1.5–4.5)
Glucose: 91 mg/dL (ref 65–99)
POTASSIUM: 3.9 mmol/L (ref 3.5–5.2)
SODIUM: 142 mmol/L (ref 134–144)
TOTAL PROTEIN: 7.3 g/dL (ref 6.0–8.5)

## 2016-01-23 LAB — C-REACTIVE PROTEIN: CRP: 0.4 mg/L (ref 0.0–4.9)

## 2016-01-23 LAB — SEDIMENTATION RATE: SED RATE: 2 mm/h (ref 0–40)

## 2016-01-23 LAB — CBC
HEMATOCRIT: 39.4 % (ref 34.0–46.6)
Hemoglobin: 13.1 g/dL (ref 11.1–15.9)
MCH: 30 pg (ref 26.6–33.0)
MCHC: 33.2 g/dL (ref 31.5–35.7)
MCV: 90 fL (ref 79–97)
PLATELETS: 246 10*3/uL (ref 150–379)
RBC: 4.36 x10E6/uL (ref 3.77–5.28)
RDW: 14.4 % (ref 12.3–15.4)
WBC: 7 10*3/uL (ref 3.4–10.8)

## 2016-01-23 LAB — VITAMIN D 25 HYDROXY (VIT D DEFICIENCY, FRACTURES): VIT D 25 HYDROXY: 34.4 ng/mL (ref 30.0–100.0)

## 2016-01-23 LAB — INSULIN AND C-PEPTIDE, SERUM
C PEPTIDE: 1.6 ng/mL (ref 1.1–4.4)
INSULIN: 5.6 u[IU]/mL (ref 2.6–24.9)

## 2016-01-23 LAB — ANA W/REFLEX IF POSITIVE: ANA: NEGATIVE

## 2016-01-23 LAB — TSH: TSH: 1.7 u[IU]/mL (ref 0.450–4.500)

## 2016-01-25 ENCOUNTER — Telehealth: Payer: Self-pay | Admitting: *Deleted

## 2016-01-25 NOTE — Telephone Encounter (Signed)
Patient called stating she needs a 3D mammogram and she needs a bone density done, patient is requesting a referral.

## 2016-01-25 NOTE — Telephone Encounter (Signed)
Left message both orders were placed by Dr. Brigitte Pulse

## 2016-02-02 DIAGNOSIS — Z87898 Personal history of other specified conditions: Secondary | ICD-10-CM | POA: Insufficient documentation

## 2016-02-06 ENCOUNTER — Ambulatory Visit (INDEPENDENT_AMBULATORY_CARE_PROVIDER_SITE_OTHER): Payer: Medicare Other | Admitting: Psychiatry

## 2016-02-06 DIAGNOSIS — F331 Major depressive disorder, recurrent, moderate: Secondary | ICD-10-CM

## 2016-02-16 ENCOUNTER — Ambulatory Visit
Admission: RE | Admit: 2016-02-16 | Discharge: 2016-02-16 | Disposition: A | Discharge status: Home / Self Care | Payer: Medicare Other | Source: Ambulatory Visit | Attending: Family Medicine | Admitting: Family Medicine

## 2016-02-16 DIAGNOSIS — M810 Age-related osteoporosis without current pathological fracture: Secondary | ICD-10-CM

## 2016-02-16 DIAGNOSIS — Z8639 Personal history of other endocrine, nutritional and metabolic disease: Secondary | ICD-10-CM

## 2016-02-16 DIAGNOSIS — Z780 Asymptomatic menopausal state: Secondary | ICD-10-CM | POA: Diagnosis not present

## 2016-02-16 DIAGNOSIS — Z1231 Encounter for screening mammogram for malignant neoplasm of breast: Secondary | ICD-10-CM | POA: Diagnosis not present

## 2016-02-20 ENCOUNTER — Ambulatory Visit (INDEPENDENT_AMBULATORY_CARE_PROVIDER_SITE_OTHER): Payer: Medicare Other | Admitting: Psychiatry

## 2016-02-20 DIAGNOSIS — F331 Major depressive disorder, recurrent, moderate: Secondary | ICD-10-CM

## 2016-02-25 NOTE — Progress Notes (Signed)
Subjective:   No chief complaint on file.    Kayla [name] is a 71 y.o. female who presents for Medicare Annual/Subsequent preventive examination. SHe was scheduled for this last month but we were unable to complete it at that time due to necessary review of her chronic medical conditions.  I will need to see Kayla [name] back in 6 weeks for medication refills. She is welcome to reschedule a date to complete her Medicare wellness exam at her convenience but M unable to do at the same time as a med refill. I met Kayla [name] several months prior.   Preventive Screening-Counseling & Management  Tobacco History  Smoking Status  . Never Smoker  Smokeless Tobacco  . Never Used     Problems Prior to Visit 1. Cystocele with rectocele - had been referred to urology in [facility] after she had prob scheduling with [facility] scheduling but [facility] was sched 3 mo out so tried [facility] and was seen by Dr. Felipa Eth 2.  New onset Diarrhea > 8 mos, IBS - pt was supposed to follow-up with Dr. Cyndi Bender [facility] [place]. Prn bentyl. Nexium for GERD 3.  Chronic interstitial cystitis with hematuria - last visit we increased her vesicare from 5 to 84m but this wasn't helping so we tried myrbetriq instead which didn't help.  Azo did not help so we tried pt on uribel.  Has to wear depends and a Kotex so going to get a bladder shock.  4.  Fibromyalgia: Followed by Dr. ____. She was weaning off fetanyl 50 mcg/hr patches since her prior pain management office with Dr. TVilinda Flakeclosed. Hydrocodone had caused chest pain. Voltaren gel helps. Also on celebrex and flexeril. 5.  Chronic migraine: Also with intermittent sharp HAs daily requiring fioricet every 6 hours to just dull the HAs, HAs did not improve when she went off of the Fioricet for several months. MRI 2017. [place] [facility] J[facility]i Likens6.  Chest pain - was caused by hydrocodone. Follows  with cardiology Dr. NAcie Fredricksonon propranolol qid which pt takes prn (about 2x/d with CP). 7.  Mood d/o With a history of anxiety, social isolation, agoraphobia, insomnia, and depression - klonopin 0.5-110mbid, cymbalta 60 (increased from 30 at her last visit), and weaned down on trazodone to 10082mFollowing with [facility] has panic Attacks and anxiety from working with her daughter who has anger management issues. When patient's daughter lives with her, patient's use of mood medication increases substantially.  I rx'ed pt a 6 mo supply of klonopin 09/27/15 so will be due for a refill 03/27/15. 8.  Osteoporosis: Last DEXA scan 2007. I do not see results in chart. Also noted history of hyperparathyroidism. She states she stopped her fosamax at some point as she was trying to get off of that. She thinks that perhaps she restarted it several months ago by her pain physician. 9.  History of diabetes mellitus. I suspect that this predated her gastric bypass. However there is no hemoglobin A1c on chart. Highest CBG was 108 in 2014.  She does check her blood sugar at home and her sugar will vary between 40 and 560 and she has symptomatic episodes where she can't tell if she is to high and to low.  That will happen 3-4x/mo but worked closely with a dietician and endocrine who are pushing her to eat more carbs and sugar  to gain weight but this is exacerbating these sudden episodes of hyperglycemia.   10.  Worried about vaginal dryness and dyspareunia.  She had a total complete hysterectomy and was wondering about screening for vaginal cancer.  However, she is unwilling to do any hormonal bloodwork. 11.  She was told to get the macular degeneration and glaucoma was worsening.  SEvere chornic dry eye, seen by Arnell Sieving PA at [facility] 3d prior and told to get inflammatory levels.  Sees cancer geneticist tomorrow.  Refer to rheum Very complimentary of husband.   Current Problems  (verified) Patient Active Problem List   Diagnosis Date Noted  . Peripheral vascular disease ([place]) 01/22/2016  . Hypertension 01/22/2016  . Diabetes mellitus 01/22/2016  . H/O hyperparathyroidism 01/22/2016  . Chest pain 12/11/2015  . Mitral regurgitation 12/11/2015  . Vitamin D deficiency 03/08/2015  . H/O gastric bypass 02/11/2014  . Chronic maxillary sinusitis 09/15/2013  . Personal history of colonic polyps 07/22/2013  . HA (headache)-chronic/tension type 07/28/2012  . Osteoporosis 07/28/2012  . Unspecified vitamin D deficiency-2009 07/28/2012  . Nephrolithiasis 07/28/2012  . Fibromyalgia 07/26/2012  . Chronic interstitial cystitis 10/16/2011  . Chronic constipation 10/04/2010  . WEIGHT LOSS 11/27/2009  . PERSONAL HISTORY OF FAILED MODERATE SEDATION 06/01/2008  . Anxiety state 03/18/2007  . Reactive depression 03/18/2007  . GERD 03/18/2007  . Diaphragmatic hernia 03/18/2007  . Osteoarthritis 03/18/2007  . [place] DISEASE, CERVICAL 03/18/2007  . [place] DISEASE, LUMBAR 03/18/2007  . Myalgia and myositis 03/18/2007    Medications Prior to Visit Current Outpatient Prescriptions on File Prior to Visit  Medication Sig Dispense Refill  . Ascorbic Acid (VITAMIN C) 1000 MG tablet Take 1,000 mg by mouth daily.    . beta carotene w/minerals (OCUVITE) tablet Take 1 tablet by mouth 2 (two) times daily.    Marland Kitchen CALCIUM-MAGNESIUM-ZINC PO Take by mouth daily.    . celecoxib (CELEBREX) 200 MG capsule Take 1 capsule (200 mg total) by mouth daily as needed. 90 capsule 1  . cetirizine (ZYRTEC) 10 MG tablet Take 10 mg by mouth daily.    . Cholecalciferol (VITAMIN D3) 2000 UNITS TABS Take 2,000 Units by mouth daily.    . clonazePAM (KLONOPIN) 1 MG tablet Take 0.5-1 tablets (0.5-1 mg total) by mouth 2 (two) times daily as needed. anxiety/panic attacks 180 tablet 1  . Cyanocobalamin (VITAMIN B-12) 1000 MCG SUBL Place 5,000 mcg under the tongue daily.     . cyclobenzaprine (FLEXERIL) 10 MG tablet Take  1 tablet (10 mg total) by mouth 3 (three) times daily as needed for muscle spasms. 90 tablet 1  . diclofenac sodium (VOLTAREN) 1 % GEL Apply 2 g topically 4 (four) times daily. 700 g 1  . dicyclomine (BENTYL) 10 MG capsule Take 1-2 tab 3 times daily AC as needed for spasms and cramping. 270 capsule 1  . diltiazem 2 % GEL Apply 1 application topically 2 (two) times daily. 30 g 0  . docusate sodium (COLACE) 250 MG capsule Take 250 mg by mouth daily.    . DULoxetine (CYMBALTA) 60 MG capsule Take 1 capsule (60 mg total) by mouth daily. 90 capsule 1  . esomeprazole (NEXIUM) 40 MG capsule Take 1 capsule (40 mg total) by mouth daily before breakfast. 90 capsule 1  . fentaNYL (DURAGESIC - DOSED MCG/HR) 12 MCG/HR Place 12.5 mcg onto the skin every 3 (three) days.    . fentaNYL (DURAGESIC - DOSED MCG/HR) 50 MCG/HR Place 50 mcg onto the skin every 3 (three) days.    Marland Kitchen  fluconazole (DIFLUCAN) 150 MG tablet Take 1 tablet (150 mg total) by mouth once. Repeat in 72 hours if needed 2 tablet 0  . fluorouracil (EFUDEX) 5 % cream Apply 1 application topically 2 (two) times daily. Reported on 03/08/2015    . gabapentin (NEURONTIN) 300 MG capsule Take 1 capsule (300 mg total) by mouth 2 (two) times daily. 60 capsule 2  . hypromellose (SYSTANE OVERNIGHT THERAPY) 0.3 % GEL ophthalmic ointment Place 1 drop into both eyes at bedtime. Reported on 02/11/2015    . ketoconazole (NIZORAL) 2 % cream Apply 1 application topically daily. Under breasts 15 g 0  . lidocaine (LIDODERM) 5 % Place 1 patch onto the skin as needed. Remove & Discard patch within 12 hours. May dispense as 3 month supply 90 patch 3  . Lifitegrast (XIIDRA) 5 % SOLN Apply to eye.    . Loteprednol Etabonate (LOTEMAX) 0.5 % GEL Place 1 drop into both eyes 2 (two) times daily. Reported on 02/11/2015    . Meth-Hyo-M Bl-Na Phos-Ph Sal (URIBEL) 118 MG CAPS Take 1 capsule (118 mg total) by mouth 4 (four) times daily as needed. For irritative bladder symptoms 120 capsule 0   . mirabegron ER (MYRBETRIQ) 25 MG TB24 tablet Take 1 tablet (25 mg total) by mouth daily. 30 tablet 2  . Omega-3 Fatty Acids (SEA-OMEGA PO) Take 1,000 mg by mouth daily.    . polyethylene glycol powder (GLYCOLAX/MIRALAX) powder Take 17 g by mouth 2 (two) times daily. Reported on 03/08/2015 850 g 1  . predniSONE (DELTASONE) 10 MG tablet Take 6tabs x1day, then 5tabs x1day, then 4tabs x1day, then 3tabs x1day, then 2tabs x1day, then 1tab x1day, then STOP 21 tablet 0  . propranolol (INDERAL) 10 MG tablet Take 1 tablet (10 mg total) by mouth 4 (four) times daily. 360 tablet 1  . pyridoxine (B-6) 200 MG tablet Take 200 mg by mouth daily.    . sucralfate (CARAFATE) 1 GM/10ML suspension Take 10 mLs (1 g total) by mouth 4 (four) times daily -  with meals and at bedtime. 420 mL 3  . SUMAtriptan (IMITREX) 100 MG tablet Take 1 tablet at earliest onset of headache, may repeat in 2 hours if headache persists or reoccurs. 30 tablet 1  . traZODone (DESYREL) 100 MG tablet Take 0.5-1 tablets (50-100 mg total) by mouth at bedtime as needed for sleep. And 1/2 -1 if wakes at 4am. 90 tablet 1  . triamcinolone (NASACORT) 55 MCG/ACT AERO nasal inhaler Place 2 sprays into the nose daily. 3 Inhaler 1  . zoster vaccine live, PF, (ZOSTAVAX) 38182 UNT/0.65ML injection Inject 19,400 Units into the skin once. Administer at pharmacy 1 each 0  . [DISCONTINUED] buPROPion (WELLBUTRIN) 75 MG tablet Take 75 mg by mouth daily after breakfast.      No current facility-administered medications on file prior to visit.     Current Medications (verified) Current Outpatient Prescriptions  Medication Sig Dispense Refill  . Ascorbic Acid (VITAMIN C) 1000 MG tablet Take 1,000 mg by mouth daily.    . beta carotene w/minerals (OCUVITE) tablet Take 1 tablet by mouth 2 (two) times daily.    Marland Kitchen CALCIUM-MAGNESIUM-ZINC PO Take by mouth daily.    . celecoxib (CELEBREX) 200 MG capsule Take 1 capsule (200 mg total) by mouth daily as needed. 90 capsule  1  . cetirizine (ZYRTEC) 10 MG tablet Take 10 mg by mouth daily.    . Cholecalciferol (VITAMIN D3) 2000 UNITS TABS Take 2,000 Units by mouth daily.    Marland Kitchen  clonazePAM (KLONOPIN) 1 MG tablet Take 0.5-1 tablets (0.5-1 mg total) by mouth 2 (two) times daily as needed. anxiety/panic attacks 180 tablet 1  . Cyanocobalamin (VITAMIN B-12) 1000 MCG SUBL Place 5,000 mcg under the tongue daily.     . cyclobenzaprine (FLEXERIL) 10 MG tablet Take 1 tablet (10 mg total) by mouth 3 (three) times daily as needed for muscle spasms. 90 tablet 1  . diclofenac sodium (VOLTAREN) 1 % GEL Apply 2 g topically 4 (four) times daily. 700 g 1  . dicyclomine (BENTYL) 10 MG capsule Take 1-2 tab 3 times daily AC as needed for spasms and cramping. 270 capsule 1  . diltiazem 2 % GEL Apply 1 application topically 2 (two) times daily. 30 g 0  . docusate sodium (COLACE) 250 MG capsule Take 250 mg by mouth daily.    . DULoxetine (CYMBALTA) 60 MG capsule Take 1 capsule (60 mg total) by mouth daily. 90 capsule 1  . esomeprazole (NEXIUM) 40 MG capsule Take 1 capsule (40 mg total) by mouth daily before breakfast. 90 capsule 1  . fentaNYL (DURAGESIC - DOSED MCG/HR) 12 MCG/HR Place 12.5 mcg onto the skin every 3 (three) days.    . fentaNYL (DURAGESIC - DOSED MCG/HR) 50 MCG/HR Place 50 mcg onto the skin every 3 (three) days.    . fluconazole (DIFLUCAN) 150 MG tablet Take 1 tablet (150 mg total) by mouth once. Repeat in 72 hours if needed 2 tablet 0  . fluorouracil (EFUDEX) 5 % cream Apply 1 application topically 2 (two) times daily. Reported on 03/08/2015    . gabapentin (NEURONTIN) 300 MG capsule Take 1 capsule (300 mg total) by mouth 2 (two) times daily. 60 capsule 2  . hypromellose (SYSTANE OVERNIGHT THERAPY) 0.3 % GEL ophthalmic ointment Place 1 drop into both eyes at bedtime. Reported on 02/11/2015    . ketoconazole (NIZORAL) 2 % cream Apply 1 application topically daily. Under breasts 15 g 0  . lidocaine (LIDODERM) 5 % Place 1 patch onto  the skin as needed. Remove & Discard patch within 12 hours. May dispense as 3 month supply 90 patch 3  . Lifitegrast (XIIDRA) 5 % SOLN Apply to eye.    . Loteprednol Etabonate (LOTEMAX) 0.5 % GEL Place 1 drop into both eyes 2 (two) times daily. Reported on 02/11/2015    . Meth-Hyo-M Bl-Na Phos-Ph Sal (URIBEL) 118 MG CAPS Take 1 capsule (118 mg total) by mouth 4 (four) times daily as needed. For irritative bladder symptoms 120 capsule 0  . mirabegron ER (MYRBETRIQ) 25 MG TB24 tablet Take 1 tablet (25 mg total) by mouth daily. 30 tablet 2  . Omega-3 Fatty Acids (SEA-OMEGA PO) Take 1,000 mg by mouth daily.    . polyethylene glycol powder (GLYCOLAX/MIRALAX) powder Take 17 g by mouth 2 (two) times daily. Reported on 03/08/2015 850 g 1  . predniSONE (DELTASONE) 10 MG tablet Take 6tabs x1day, then 5tabs x1day, then 4tabs x1day, then 3tabs x1day, then 2tabs x1day, then 1tab x1day, then STOP 21 tablet 0  . propranolol (INDERAL) 10 MG tablet Take 1 tablet (10 mg total) by mouth 4 (four) times daily. 360 tablet 1  . pyridoxine (B-6) 200 MG tablet Take 200 mg by mouth daily.    . sucralfate (CARAFATE) 1 GM/10ML suspension Take 10 mLs (1 g total) by mouth 4 (four) times daily -  with meals and at bedtime. 420 mL 3  . SUMAtriptan (IMITREX) 100 MG tablet Take 1 tablet at earliest onset of headache,  may repeat in 2 hours if headache persists or reoccurs. 30 tablet 1  . traZODone (DESYREL) 100 MG tablet Take 0.5-1 tablets (50-100 mg total) by mouth at bedtime as needed for sleep. And 1/2 -1 if wakes at 4am. 90 tablet 1  . triamcinolone (NASACORT) 55 MCG/ACT AERO nasal inhaler Place 2 sprays into the nose daily. 3 Inhaler 1  . zoster vaccine live, PF, (ZOSTAVAX) 56979 UNT/0.65ML injection Inject 19,400 Units into the skin once. Administer at pharmacy 1 each 0   No current facility-administered medications for this visit.      Allergies (verified) Ambien [zolpidem tartrate]; Codeine; Oxycodone-acetaminophen; Tylox  [oxycodone-acetaminophen]; Darvocet [propoxyphene n-acetaminophen]; Darvon [propoxyphene]; Food; Lorcet [hydrocodone-acetaminophen]; Opium; Vicodin [hydrocodone-acetaminophen]; Wheat bran; Zolpidem tartrate; Zolpidem tartrate; Amoxicillin; and Penicillins   PAST HISTORY  Family History Family History  Problem Relation Age of Onset  . Stroke Mother   . Lung cancer Mother   . Heart disease Mother   . Diabetes Mother   . Hypertension Father   . Stroke Father   . Lung cancer Father   . Heart disease Father   . Kidney disease Father   . Seizures Father     epilepsy, and sisters x 2  . Pancreatic cancer Sister   . Lung cancer Sister   . Colon cancer Maternal Aunt     12 relatives  . Uterine cancer      aunt  . Heart disease      grandmother  . Clotting disorder Sister   . Heart disease Sister     x 2  . Kidney disease Sister     x 2  . Breast cancer Sister   . Colon cancer Paternal Aunt   . Colon cancer Paternal Aunt     Social History Social History  Substance Use Topics  . Smoking status: Never Smoker  . Smokeless tobacco: Never Used  . Alcohol use No     Are there smokers in your home (other than you)? No  Risk Factors Current exercise habits: Home exercise routine includes walking.  Dietary issues discussed: heart healthy   Cardiac risk factors: advanced age (older than 8 for men, 21 for women).  Depression Screen Depression screen [facility] 2/9 01/22/2016 11/24/2015 09/27/2015 03/08/2015 02/11/2015  Decreased Interest 0 0 0 3 0  Down, Depressed, Hopeless - 0 0 3 0  PHQ - 2 Score 0 0 0 6 0  Altered sleeping - - - 3 -  Tired, decreased energy - - - 3 -  Change in appetite - - - 3 -  Feeling bad or failure about yourself  - - - 3 -  Trouble concentrating - - - 3 -  Moving slowly or fidgety/restless - - - 3 -  Suicidal thoughts - - - 0 -  PHQ-9 Score - - - 24 -  Difficult doing work/chores - - - Very difficult -  Some recent data might be hidden     Activities  of Daily Living In your present state of health, do you have any difficulty performing the following activities?:  Driving? No Managing money?  No Feeding yourself? No Getting from bed to chair? No Climbing a flight of stairs? No Preparing food and eating?: No Bathing or showering? No Getting dressed: No Getting to the toilet? No Using the toilet:No Moving around from place to place: No In the past year have you fallen or had a near fall?:No   Are you sexually active?  No  Do you have  more than one partner?  No  Hearing Difficulties: No Do you often ask people to speak up or repeat themselves? No Do you experience ringing or noises in your ears? No Do you have difficulty understanding soft or whispered voices? No   Do you feel that you have a problem with memory? No  Do you often misplace items? No  Do you feel safe at home?  Yes  Cognitive Testing  Alert? Yes  Normal Appearance?Yes  Oriented to person? Yes  Place? Yes   Time? Yes  Recall of three objects?  Yes  Can perform simple calculations? Yes  Displays appropriate judgment?Yes  Can read the correct time from a watch face?Yes   Advanced Directives have been discussed with the patient? Yes  List the Names of Other Physician/Practitioners you currently use: 1.  Dermatology Ms. Lamar at [place] 2.  GI Dr. Liane Comber 3.  Pain - Mr. Rochele Pages 4.  Neuro - Dr. Loretta Plume 5.  Sports Med - Dr. Tamala Julian 6.  Urology - Dr. Felipa Eth 7.  Cardiology Dr. Acie Fredrickson  Indicate any recent Medical Services you may have received from other than [facility] providers in the past year (date may be approximate).  Immunization History  Administered Date(s) Administered  . Influenza Split 10/02/2011  . Influenza Whole 08/21/2007  . Influenza,inj,Quad PF,36+ Mos 12/15/2013, 11/23/2014, 09/27/2015  . Pneumococcal Conjugate-13 09/05/2014  . Pneumococcal Polysaccharide-23 10/02/2011, 02/08/2013  . Td 01/14/2006  . Tdap 01/22/2016     Screening Tests Health Maintenance  Topic Date Due  . FOOT EXAM  08/25/1955  . OPHTHALMOLOGY EXAM  [birth year]/25/1955  . URINE MICROALBUMIN  08/25/1957  . ZOSTAVAX  08/26/2007  . HEMOGLOBIN A1C  07/21/2016  . MAMMOGRAM  02/15/2018  . COLONOSCOPY  03/06/2018  . TETANUS/TDAP  01/21/2026  . INFLUENZA VACCINE  Completed  . DEXA SCAN  Completed  . Hepatitis C Screening  Completed  . PNA vac Low Risk Adult  Completed    All answers were reviewed with the patient and necessary referrals were made:  SHAW,EVA, MD   02/25/2016   History reviewed: allergies, current medications, past family history, past medical history, past social history, past surgical history and problem list  Review of Systems Pertinent items are noted in HPI.    Objective:  There were no vitals taken for this visit. No exam data present  There were no vitals taken for this visit.  General Appearance:    Alert, cooperative, no distress, appears stated age  Head:    Normocephalic, without obvious abnormality, atraumatic  Eyes:    PERRL, conjunctiva/corneas clear, EOM's intact, fundi    benign, both eyes  Ears:    Normal TM's and external ear canals, both ears  Nose:   Nares normal, septum midline, mucosa normal, no drainage    or sinus tenderness  Throat:   Lips, mucosa, and tongue normal; teeth and gums normal  Neck:   Supple, symmetrical, trachea midline, no adenopathy;    thyroid:  no enlargement/tenderness/nodules; no carotid   bruit or JVD  Back:     Symmetric, no curvature, ROM normal, no CVA tenderness  Lungs:     Clear to auscultation bilaterally, respirations unlabored  Chest Wall:    No tenderness or deformity   Heart:    Regular rate and rhythm, S1 and S2 normal, no murmur, rub   or gallop  Breast Exam:    No tenderness, masses, or nipple abnormality  Abdomen:     Soft, non-tender, bowel  sounds active all four quadrants,    no masses, no organomegaly  Genitalia:    Normal female without lesion,  discharge or tenderness  Rectal:    Normal tone, normal prostate, no masses or tenderness;   guaiac negative stool  Extremities:   Extremities normal, atraumatic, no cyanosis or edema  Pulses:   2+ and symmetric all extremities  Skin:   Skin color, texture, turgor normal, no rashes or lesions  Lymph nodes:   Cervical, supraclavicular, and axillary nodes normal  Neurologic:   CNII-XII intact, normal strength, sensation and reflexes    throughout       Assessment:     1. Osteoporosis without current pathological fracture, unspecified osteoporosis type   2. Routine screening for STI (sexually transmitted infection)   3. Encounter for screening mammogram for breast cancer   4. Screening for cardiovascular, respiratory, and genitourinary diseases   5. Screening for colorectal cancer   6. Screening for deficiency anemia   7. Screening for thyroid disorder   8. Peripheral vascular disease ([place])   9. Hypertension, unspecified type   10. Chronic constipation   11. Chronic interstitial cystitis   12. Osteoarthritis, unspecified osteoarthritis type, unspecified site   13. Vitamin D deficiency   14. Reactive depression   15. Fibromyalgia   16. H/O gastric bypass   17. H/O hyperparathyroidism   18. Chronic intractable headache, unspecified headache type   19. Anxiety state   20. Elevated glucose   21. Vision loss   22. Abdominal spasms   23. Excess skin         Plan:     During the course of the visit the patient was educated and counseled about appropriate screening and preventive services including:      Diet review for nutrition referral? Yes ____  Not Indicated x____  Need to do DM stuff at next visit: foot exam, optho exam,  Needs shingles vaccine, needs tetanus (2008),  Mammograms done annuallyh at the [facility] center in Sept but not done this past year (last 09/2014 nml) Colonoscopy done 02/2015 results scanned in (need to view DEXA 2007 - results not visible  Lipid,  ua, cmp, vitamin D (h/o def)  Patient Instructions (the written plan) was given to the patient.  We'll have patient return to clinic within 2 months for refills of her chronic medications. Cautious with the interaction of Cymbalta 60, Flexeril 10, trazodone 100 for serotonin OD.  Does bentyl, gabapentin, fioricet interact?   No orders of the defined types were placed in this encounter.   No orders of the defined types were placed in this encounter.   [name], M.D.  [facility] [address] [place], [place] [address] [phone] phone [phone] fax  02/25/16 12:17 PM  Medicare Attestation I have personally reviewed: The patient's medical and social history Their use of alcohol, tobacco or illicit drugs Their current medications and supplements The patient's functional ability including ADLs,fall risks, home safety risks, cognitive, and hearing and visual impairment Diet and physical activities Evidence for depression or mood disorders  The patient's weight, height, BMI, and visual acuity have been recorded in the chart.  I have made referrals, counseling, and provided education to the patient based on review of the above and I have provided the patient with a written personalized care plan for preventive services.     [name], MD   02/25/2016    NEEDS DEXA  Patient ID: [name],  female   DOB: 25-Sep-1945, 71 y.o.   MRN: [id number]

## 2016-02-26 ENCOUNTER — Ambulatory Visit (INDEPENDENT_AMBULATORY_CARE_PROVIDER_SITE_OTHER): Payer: Medicare Other | Admitting: Family Medicine

## 2016-02-26 ENCOUNTER — Encounter: Payer: Self-pay | Admitting: Family Medicine

## 2016-02-26 VITALS — BP 102/62 | HR 99 | Temp 98.6°F | Resp 16 | Ht 65.5 in | Wt 116.4 lb

## 2016-02-26 DIAGNOSIS — F411 Generalized anxiety disorder: Secondary | ICD-10-CM | POA: Diagnosis not present

## 2016-02-26 DIAGNOSIS — K2100 Gastro-esophageal reflux disease with esophagitis, without bleeding: Secondary | ICD-10-CM

## 2016-02-26 DIAGNOSIS — G43C0 Periodic headache syndromes in child or adult, not intractable: Secondary | ICD-10-CM | POA: Diagnosis not present

## 2016-02-26 DIAGNOSIS — K449 Diaphragmatic hernia without obstruction or gangrene: Secondary | ICD-10-CM

## 2016-02-26 DIAGNOSIS — K3189 Other diseases of stomach and duodenum: Secondary | ICD-10-CM

## 2016-02-26 DIAGNOSIS — M5137 Other intervertebral disc degeneration, lumbosacral region: Secondary | ICD-10-CM

## 2016-02-26 DIAGNOSIS — K602 Anal fissure, unspecified: Secondary | ICD-10-CM

## 2016-02-26 DIAGNOSIS — I10 Essential (primary) hypertension: Secondary | ICD-10-CM | POA: Diagnosis not present

## 2016-02-26 MED ORDER — ZOSTER VACCINE LIVE 19400 UNT/0.65ML ~~LOC~~ SUSR: 0.6500 mL | Freq: Once | SUBCUTANEOUS | 0 refills | Status: AC

## 2016-02-26 MED ORDER — SUMATRIPTAN SUCCINATE 100 MG PO TABS: ORAL_TABLET | ORAL | 1 refills | Status: DC

## 2016-02-26 MED ORDER — MIRABEGRON ER 25 MG PO TB24: 25.0000 mg | ORAL_TABLET | Freq: Every day | ORAL | 1 refills | Status: DC

## 2016-02-26 MED ORDER — GABAPENTIN 300 MG PO CAPS: 300.0000 mg | ORAL_CAPSULE | Freq: Three times a day (TID) | ORAL | 1 refills | Status: DC

## 2016-02-26 MED ORDER — LIDOCAINE 5 % EX PTCH: 1.0000 | MEDICATED_PATCH | CUTANEOUS | 3 refills | Status: DC | PRN

## 2016-02-26 MED ORDER — DULOXETINE HCL 60 MG PO CPEP: 60.0000 mg | ORAL_CAPSULE | Freq: Every day | ORAL | 1 refills | Status: DC

## 2016-02-26 MED ORDER — PROPRANOLOL HCL 10 MG PO TABS: 10.0000 mg | ORAL_TABLET | Freq: Four times a day (QID) | ORAL | 1 refills | Status: DC

## 2016-02-26 MED ORDER — CELECOXIB 200 MG PO CAPS: 200.0000 mg | ORAL_CAPSULE | Freq: Every day | ORAL | 1 refills | Status: DC | PRN

## 2016-02-26 MED ORDER — DICLOFENAC SODIUM 1 % TD GEL: 2.0000 g | Freq: Four times a day (QID) | TRANSDERMAL | 1 refills | Status: DC

## 2016-02-26 MED ORDER — DICYCLOMINE HCL 10 MG PO CAPS: ORAL_CAPSULE | ORAL | 1 refills | Status: DC

## 2016-02-26 MED ORDER — TRAZODONE HCL 100 MG PO TABS: 50.0000 mg | ORAL_TABLET | Freq: Every evening | ORAL | 1 refills | Status: DC | PRN

## 2016-02-26 MED ORDER — SUCRALFATE 1 GM/10ML PO SUSP: 1.0000 g | Freq: Three times a day (TID) | ORAL | 3 refills | Status: DC

## 2016-02-26 MED ORDER — CYCLOBENZAPRINE HCL 10 MG PO TABS: 10.0000 mg | ORAL_TABLET | Freq: Three times a day (TID) | ORAL | 1 refills | Status: DC | PRN

## 2016-02-26 MED ORDER — DILTIAZEM GEL 2 %: 1.0000 "application " | Freq: Two times a day (BID) | CUTANEOUS | 0 refills | Status: DC

## 2016-02-26 MED ORDER — CLONAZEPAM 1 MG PO TABS: 0.5000 mg | ORAL_TABLET | Freq: Two times a day (BID) | ORAL | 1 refills | Status: DC | PRN

## 2016-02-26 MED ORDER — ESOMEPRAZOLE MAGNESIUM 40 MG PO CPDR: 40.0000 mg | DELAYED_RELEASE_CAPSULE | Freq: Every day | ORAL | 1 refills | Status: DC

## 2016-02-26 MED ORDER — ZOSTER VACCINE LIVE 19400 UNT/0.65ML ~~LOC~~ SUSR: 0.6500 mL | Freq: Once | SUBCUTANEOUS | 0 refills | Status: DC

## 2016-02-26 NOTE — Progress Notes (Signed)
Subjective:    Patient ID: Kayla [name], female    DOB: [birth year], 71 y.o.   MRN: [id number] Chief Complaint  Patient presents with  . Medication Refill    all her medication  . Shingles vaccine    states she needs it according to the [employer]  . other    both big toes/toenails are curving in.    HPI 1. Cystocele with rectocele - had been referred to urology in [facility] after she had prob scheduling with [facility] scheduling but [facility] was sched 3 mo out so tried [facility] and was seen by Dr. Felipa Eth 2.  New onset Diarrhea > 8 mos, IBS - pt was supposed to follow-up with Dr. Cyndi Bender [facility] of the [place]. Prn bentyl. Nexium for GERD. She had to reschedule her GI appt.  3.  Chronic interstitial cystitis with hematuria - last visit we increased her vesicare from 5 to 10mg  but this wasn't helping so we tried myrbetriq instead which didn't help.  Azo did not help so we tried pt on uribel.  Has to wear depends and a Kotex so going to get a bladder shock. Sees Dr. Garen Lah 4.  Fibromyalgia: Followed by [facility] Rochele [facility]. She was weaning off fetanyl 50 mcg/hr patches since her prior pain management office with Dr. Vilinda Flake closed. Hydrocodone had caused chest pain. Voltaren gel helps. Also on celebrex qd and flexeril. 5.  Chronic migraine: Also with intermittent sharp HAs daily requiring fioricet every 6 hours to just dull the HAs, HAs did not improve when she went off of the Fioricet for several months. MRI 2017. She was referred to Dr. Charlann Boxer at [facility] for this by [facility] neurologist Dr. Tomi Likens 6.  Chest pain - was caused by hydrocodone. Follows with cardiology Dr. Acie Fredrickson on propranolol qid which pt takes prn (about 2x/d with CP). 7.  Mood d/o With a history of anxiety, social isolation, agoraphobia, insomnia, and depression - klonopin 0.5-1mg  bid, cymbalta 60 (increased from 30 at her last visit), and weaned down on trazodone to 100mg . Following with  therapist at [facility]. Pt has panic Attacks and anxiety from working with her daughter who has anger management issues. When patient's daughter lives with her, patient's use of mood medication increases substantially.  I rx'ed pt a 6 mo supply of klonopin 09/27/15 so will be due for a refill 03/27/15.  She uses the klonopin just for panic attacks but she gernally does have a pnic attack about ttwice a day though she keeps hoping her family will move out and she will need it a little less 8.  Osteoporosis: Last DEXA scan 2007. I do not see results in chart. Also noted history of hyperparathyroidism. She states she stopped her fosamax at some point as she was trying to get off of that. She thinks that perhaps she restarted it several months ago by her pain physician.  We referred her to [facility] last wk to treat as 02/16/16 T score -3.3 and with her young age I think she could benefit from bone building treatments (reclast, prolia). 9.  History of diabetes mellitus. I suspect that this predated her gastric bypass. However there is no hemoglobin A1c on chart. Highest CBG was 108 in 2014.  She does check her blood sugar at home and her sugar will vary between 40 and 560 and she has symptomatic episodes where she can't tell if she is to high and to low.  That will  happen 3-4x/mo but worked closely with a dietician and endocrine who are pushing her to eat more carbs and sugar to gain weight but this is exacerbating these sudden episodes of hyperglycemia.  Her last hemoglobina1c 5.3 10.  Worried about vaginal dryness and dyspareunia.  She had a total complete hysterectomy and was wondering about screening for vaginal cancer.  However, she is unwilling to do any hormonal bloodwork. 11.  She was told to get the macular degeneration and glaucoma was worsening.  SEvere chornic dry eye, seen by Arnell Sieving PA at [place] [facility] prior and told to get inflammatory levels.  Saw cancer geneticist   She  resumed seeing therapist - Dr. Kyra Leyland - so she was able to go to the funerals again.  Neurologist Dr. Tomi Likens took her off of the fioricet. She was told to increase her gabapentin if needed for headaches. Ok to take excedrin migraine for headaches.  Pt was asking for refill at trazodone - was on 150mg  initially but she found no matter how low she goes on that that she still wakes up at 4 am - she wants to try trazodone 100mg  1/2 before bed and 1/2 at night and she wants to gradually wean off and  Try other sleep hygeine things. Reminded pt that klonopin is not as safe as trazodone.   Sinuses very dry  She gets frequent vaginal tears as she has to put vaginal pressure and her fingernails tear the vagincal mucoa and the tissue keeps tearing. Severe constipaiton and recurrent hemorrhoids When she eats her lower abdomen descends and gains weight. And now her upper abdomen is distending and cramping as well.  Depression screen [facility] 2/9 02/26/2016 01/22/2016 11/24/2015 09/27/2015 03/08/2015  Decreased Interest 0 0 0 0 3  Down, Depressed, Hopeless 0 - 0 0 3  PHQ - 2 Score 0 0 0 0 6  Altered sleeping - - - - 3  Tired, decreased energy - - - - 3  Change in appetite - - - - 3  Feeling bad or failure about yourself  - - - - 3  Trouble concentrating - - - - 3  Moving slowly or fidgety/restless - - - - 3  Suicidal thoughts - - - - 0  PHQ-9 Score - - - - 24  Difficult doing work/chores - - - - Very difficult  Some recent data might be hidden   Past Medical History:  Diagnosis Date  . Allergy   . Anemia    anemia in the past  . Angina    takes Propanolol prn and it stops her chest  . Anxiety   . Barrett esophagus    not consistently present  . Cataract   . Chronic kidney disease    kidney stones and UTI  . Complication of anesthesia    either  BP or pulse dropped with last surgery  . DDD (degenerative disc disease)    cervical and lumbar  . Dementia   . Depression    post traumatic  stress  disorder  . Diabetes mellitus    sugar goes up and down diet controlled  . Dysrhythmia    brought on by stress  . Fatigue   . Fibromyalgia    neuropathy knees ankles and toes, bladder  . GERD (gastroesophageal reflux disease)   . H/O hyperparathyroidism   . Headache   . Headache(784.0)    migraines  . Hiatal hernia   . History of kidney stones   . Hypertension  3 small brain aneurysms  . IBS (irritable bowel syndrome)   . Macular degeneration   . Migraines   . Osteoarthritis    all over  . Osteopetrosis   . Osteoporosis   . Peripheral vascular disease (HCC)    superficial phlebitis in left calf  . Personal history of colonic polyps 07/22/2013   07/2013 - 3 small adenomas - repeat colonoscopy 2018   Past Surgical History:  Procedure Laterality Date  . ABDOMINAL HYSTERECTOMY    . APPENDECTOMY    . CARDIAC CATHETERIZATION  03/22/1991   EF 61%  . CARDIOVASCULAR STRESS TEST  10/03/2006  . CHOLECYSTECTOMY    . COLONOSCOPY  06/23/2008   normal  . DILATION AND CURETTAGE OF UTERUS     x2  . ESOPHAGUS SURGERY    . EXPLORATORY LAPAROTOMY  1993  . GASTRIC BYPASS  1999  . GASTRIC RESTRICTION SURGERY  1991  . Right Arm Surgery    . Right Knee Arthroscopy    . Rt wrist fx  2009  . stomach stappeling  1991  . STONE EXTRACTION WITH BASKET  2012  . TONSILLECTOMY    . TUBAL LIGATION    . UPPER GASTROINTESTINAL ENDOSCOPY  06/23/2008   w/Dil, Barrett's esophagus  . US ECHOCARDIOGRAPHY  01/21/2007   EF 55-60%  . US ECHOCARDIOGRAPHY  08/30/2003   EF 55-60%   Current Outpatient Prescriptions on File Prior to Visit  Medication Sig Dispense Refill  . Ascorbic Acid (VITAMIN C) 1000 MG tablet Take 1,000 mg by mouth daily.    . beta carotene w/minerals (OCUVITE) tablet Take 1 tablet by mouth 2 (two) times daily.    Marland Kitchen CALCIUM-MAGNESIUM-ZINC PO Take by mouth daily.    . Cholecalciferol (VITAMIN D3) 2000 UNITS TABS Take 2,000 Units by mouth daily.    . Cyanocobalamin (VITAMIN B-12)  1000 MCG SUBL Place 5,000 mcg under the tongue daily.     Marland Kitchen docusate sodium (COLACE) 250 MG capsule Take 250 mg by mouth daily.    . fentaNYL (DURAGESIC - DOSED MCG/HR) 12 MCG/HR Place 12.5 mcg onto the skin every 3 (three) days.    . fentaNYL (DURAGESIC - DOSED MCG/HR) 50 MCG/HR Place 50 mcg onto the skin every 3 (three) days.    . hypromellose (SYSTANE OVERNIGHT THERAPY) 0.3 % GEL ophthalmic ointment Place 1 drop into both eyes at bedtime. Reported on 02/11/2015    . Lifitegrast (XIIDRA) 5 % SOLN Apply to eye.    . Loteprednol Etabonate (LOTEMAX) 0.5 % GEL Place 1 drop into both eyes 2 (two) times daily. Reported on 02/11/2015    . Omega-3 Fatty Acids (SEA-OMEGA PO) Take 1,000 mg by mouth daily.    . polyethylene glycol powder (GLYCOLAX/MIRALAX) powder Take 17 g by mouth 2 (two) times daily. Reported on 03/08/2015 850 g 1  . pyridoxine (B-6) 200 MG tablet Take 200 mg by mouth daily.    . [DISCONTINUED] buPROPion (WELLBUTRIN) 75 MG tablet Take 75 mg by mouth daily after breakfast.      No current facility-administered medications on file prior to visit.    Allergies  Allergen Reactions  . Ambien [Zolpidem Tartrate]     Hallucinations  . Codeine Anaphylaxis  . Oxycodone-Acetaminophen Shortness Of Breath  . Tylox [Oxycodone-Acetaminophen] Anaphylaxis    Chest pain  . Darvocet [Propoxyphene N-Acetaminophen]     Chest pain  . Darvon [Propoxyphene] Itching  . Food Swelling    Peanuts-wheat-  . Lorcet [Hydrocodone-Acetaminophen]     Chest pain  .  Opium     hallucinations  . Vicodin [Hydrocodone-Acetaminophen] Other (See Comments)    Chest Pain   . Wheat Bran   . Zolpidem Tartrate Other (See Comments)    Makes her go crazy  . Zolpidem Tartrate     hallucinations  . Amoxicillin Nausea And Vomiting    Reaction:  Migraine headache  . Penicillins Nausea And Vomiting    Migraine Headaches   Family History  Problem Relation Age of Onset  . Stroke Mother   . Lung cancer Mother   .  Heart disease Mother   . Diabetes Mother   . Hypertension Father   . Stroke Father   . Lung cancer Father   . Heart disease Father   . Kidney disease Father   . Seizures Father     epilepsy, and sisters x 2  . Pancreatic cancer Sister   . Lung cancer Sister   . Colon cancer Maternal Aunt     12 relatives  . Uterine cancer      aunt  . Heart disease      grandmother  . Clotting disorder Sister   . Heart disease Sister     x 2  . Kidney disease Sister     x 2  . Breast cancer Sister   . Colon cancer Paternal Aunt   . Colon cancer Paternal Aunt    Social History   Social History  . Marital status: Married    Spouse name: Rushie Chestnut.  . Number of children: 2  . Years of education: N/A   Occupational History  . Retired   .  Retired   Social History Main Topics  . Smoking status: Never Smoker  . Smokeless tobacco: Never Used  . Alcohol use No  . Drug use: No  . Sexual activity: Not Currently   Other Topics Concern  . None   Social History Narrative  . None    Review of Systems See hpi    Objective:   Physical Exam  Constitutional: She is oriented to person, place, and time. She appears well-developed and well-nourished. No distress.  HENT:  Head: Normocephalic and atraumatic.  Right Ear: External ear normal.  Left Ear: External ear normal.  Eyes: Conjunctivae are normal. No scleral icterus.  Neck: Normal range of motion. Neck supple. No thyromegaly present.  Cardiovascular: Normal rate, regular rhythm, normal heart sounds and intact distal pulses.   Pulmonary/Chest: Effort normal and breath sounds normal. No respiratory distress.  Musculoskeletal: She exhibits no edema.  Lymphadenopathy:    She has no cervical adenopathy.  Neurological: She is alert and oriented to person, place, and time.  Skin: Skin is warm and dry. She is not diaphoretic. No erythema.  Psychiatric: She has a normal mood and affect. Her behavior is normal.     BP 102/62 (BP  Location: Right Arm, Patient Position: Sitting, Cuff Size: Normal)   Pulse 99   Temp 98.6 F (37 C) (Oral)   Resp 16   Ht 5' 5.5" (1.664 m)   Wt 116 lb 6.4 oz (52.8 kg)   SpO2 95%   BMI 19.08 kg/m       Assessment & Plan:   1. Hypertension, unspecified type   2. Periodic headache syndrome, not intractable   3. Spasm of GI tract   4. Diaphragmatic hernia without obstruction and without gangrene   5. Gastroesophageal reflux disease with esophagitis   6. Anxiety state   7. DISC DISEASE, LUMBAR  8. Anal fissure    Needs foot exam at next visit?  Pt is not diabetic - she has pre-diabetes so resmove from health maintanence  Orders Placed This Encounter  Procedures  . Microalbumin/Creatinine Ratio, Urine  . HM DIABETES FOOT EXAM    Meds ordered this encounter  Medications  . DISCONTD: Zoster Vaccine Live, PF, (ZOSTAVAX) 63149 UNT/0.65ML injection    Sig: Inject 19,400 Units into the skin once.    Dispense:  1 vial    Refill:  0  . Zoster Vaccine Live, PF, (ZOSTAVAX) 70263 UNT/0.65ML injection    Sig: Inject 19,400 Units into the skin once.    Dispense:  1 vial    Refill:  0  . SUMAtriptan (IMITREX) 100 MG tablet    Sig: Take 1 tablet at earliest onset of headache, may repeat in 2 hours if headache persists or reoccurs.    Dispense:  30 tablet    Refill:  1    This is a 90 day supply  . esomeprazole (NEXIUM) 40 MG capsule    Sig: Take 1 capsule (40 mg total) by mouth daily before breakfast.    Dispense:  90 capsule    Refill:  1  . traZODone (DESYREL) 100 MG tablet    Sig: Take 0.5-1 tablets (50-100 mg total) by mouth at bedtime as needed for sleep. And 1/2 -1 if wakes at 4am.    Dispense:  90 tablet    Refill:  1  . diclofenac sodium (VOLTAREN) 1 % GEL    Sig: Apply 2 g topically 4 (four) times daily.    Dispense:  700 g    Refill:  1    700 grams for 3 mos supply  . propranolol (INDERAL) 10 MG tablet    Sig: Take 1 tablet (10 mg total) by mouth 4 (four) times  daily.    Dispense:  360 tablet    Refill:  1  . cyclobenzaprine (FLEXERIL) 10 MG tablet    Sig: Take 1 tablet (10 mg total) by mouth 3 (three) times daily as needed for muscle spasms.    Dispense:  270 tablet    Refill:  1  . dicyclomine (BENTYL) 10 MG capsule    Sig: Take 1-2 tab 3 times daily AC as needed for spasms and cramping.    Dispense:  270 capsule    Refill:  1  . gabapentin (NEURONTIN) 300 MG capsule    Sig: Take 1 capsule (300 mg total) by mouth 3 (three) times daily.    Dispense:  270 capsule    Refill:  1  . lidocaine (LIDODERM) 5 %    Sig: Place 1 patch onto the skin as needed. Remove & Discard patch within 12 hours. May dispense as 3 month supply    Dispense:  90 patch    Refill:  3  . DULoxetine (CYMBALTA) 60 MG capsule    Sig: Take 1 capsule (60 mg total) by mouth daily.    Dispense:  90 capsule    Refill:  1  . mirabegron ER (MYRBETRIQ) 25 MG TB24 tablet    Sig: Take 1 tablet (25 mg total) by mouth daily.    Dispense:  90 tablet    Refill:  1  . celecoxib (CELEBREX) 200 MG capsule    Sig: Take 1 capsule (200 mg total) by mouth daily as needed.    Dispense:  90 capsule    Refill:  1  . clonazePAM (KLONOPIN) 1 MG tablet  Sig: Take 0.5-1 tablets (0.5-1 mg total) by mouth 2 (two) times daily as needed. anxiety/panic attacks. May fill on or after March 2018. This is a 3 month supply    Dispense:  180 tablet    Refill:  1  . diltiazem 2 % GEL    Sig: Apply 1 application topically 2 (two) times daily.    Dispense:  30 g    Refill:  0  . sucralfate (CARAFATE) 1 GM/10ML suspension    Sig: Take 10 mLs (1 g total) by mouth 4 (four) times daily -  with meals and at bedtime.    Dispense:  420 mL    Refill:  3    I personally performed the services described in this documentation, which was scribed in my presence. The recorded information has been reviewed and considered, and addended by me as needed.   [name], M.D.  Primary Care at [facility] [place], [place] [address] [phone] phone [phone] fax  03/13/16 7:42 AM  Over 40 min spent in face-to-face evaluation of and consultation with patient and coordination of care.  Over 50% of this time was spent counseling this patient.

## 2016-02-26 NOTE — Patient Instructions (Signed)
     IF you received an x-ray today, you will receive an invoice from [facility]. Please contact [facility] at [phone] with questions or concerns regarding your invoice.   IF you received labwork today, you will receive an invoice from [facility]. Please contact [facility] at [phone] with questions or concerns regarding your invoice.   Our billing staff will not be able to assist you with questions regarding bills from these companies.  You will be contacted with the lab results as soon as they are available. The fastest way to get your results is to activate your My Chart account. Instructions are located on the last page of this paperwork. If you have not heard from us regarding the results in 2 weeks, please contact this office.     

## 2016-02-27 DIAGNOSIS — H02201 Unspecified lagophthalmos right upper eyelid: Secondary | ICD-10-CM | POA: Diagnosis not present

## 2016-02-27 DIAGNOSIS — H40023 Open angle with borderline findings, high risk, bilateral: Secondary | ICD-10-CM | POA: Diagnosis not present

## 2016-02-27 DIAGNOSIS — Z961 Presence of intraocular lens: Secondary | ICD-10-CM | POA: Diagnosis not present

## 2016-02-27 DIAGNOSIS — H02202 Unspecified lagophthalmos right lower eyelid: Secondary | ICD-10-CM | POA: Diagnosis not present

## 2016-02-27 DIAGNOSIS — H353131 Nonexudative age-related macular degeneration, bilateral, early dry stage: Secondary | ICD-10-CM | POA: Diagnosis not present

## 2016-02-27 DIAGNOSIS — D2311 Other benign neoplasm of skin of right eyelid, including canthus: Secondary | ICD-10-CM | POA: Diagnosis not present

## 2016-02-27 DIAGNOSIS — H04123 Dry eye syndrome of bilateral lacrimal glands: Secondary | ICD-10-CM | POA: Diagnosis not present

## 2016-02-27 DIAGNOSIS — H02204 Unspecified lagophthalmos left upper eyelid: Secondary | ICD-10-CM | POA: Diagnosis not present

## 2016-02-27 LAB — MICROALBUMIN / CREATININE URINE RATIO
CREATININE, UR: 126.9 mg/dL
Microalb/Creat Ratio: 4.7 mg/g creat (ref 0.0–30.0)
Microalbumin, Urine: 6 ug/mL

## 2016-03-04 ENCOUNTER — Ambulatory Visit: Payer: Medicare Other | Admitting: Family Medicine

## 2016-03-05 ENCOUNTER — Ambulatory Visit (INDEPENDENT_AMBULATORY_CARE_PROVIDER_SITE_OTHER): Payer: Medicare Other | Admitting: Psychiatry

## 2016-03-05 DIAGNOSIS — F331 Major depressive disorder, recurrent, moderate: Secondary | ICD-10-CM | POA: Diagnosis not present

## 2016-03-07 ENCOUNTER — Ambulatory Visit: Payer: Self-pay | Admitting: Neurology

## 2016-03-19 ENCOUNTER — Ambulatory Visit: Payer: Medicare Other | Admitting: Psychiatry

## 2016-03-20 ENCOUNTER — Telehealth: Payer: Self-pay | Admitting: Family Medicine

## 2016-03-20 MED ORDER — NALOXONE HCL 0.4 MG/0.4ML IJ SOAJ: INTRAMUSCULAR | 0 refills | Status: DC

## 2016-03-20 NOTE — Telephone Encounter (Addendum)
PATIENT WOULD LIKE THIS MESSAGE TO GO TO DR. SHAW: 1. SHE NEEDS A REFILL ON CLONAZEPAM 1 MG. 2. SHE HAS A QUESTION ABOUT TAKING THE CLONAZEPAM WITH TRAZODONE? 3. SHE HAS HAD NAUSEA FOR 4 DAYS - DOES DR. SHAW THINK SHE SHOULD JUST RIDE IT OUT BECAUSE SHE DOES NOT FEEL LIKE COMING INTO THE OFFICE IF SHE DOES NOT NEED TO? BEST PHONE [phone] (CELL) [place] PATIENT CALLED BACK AGAIN TO LET DR. SHAW KNOW THAT SHE RECEIVED A LETTER FROM THE [employer] [employer] [facility] THAT THEY WOULD SEND HER A FREE NALOXONE RESCUE KIT BECAUSE SHE USES [employer] PATCHES IF DR. SHAW WOULD REQUEST IT.

## 2016-03-20 NOTE — Telephone Encounter (Signed)
Pt is due for a refill on clonazepam on 3/13 or after so at her visit last mo we faxed a 6 mo supply of clonazepam to her mail order pharmacy [facility] with the note that it was not to be dispensed until March. I would recommend pt contact her pharmacy - they likely have this rx being held on file for her.  Yes, good idea to have naloxone rescue kit. Does she want to pick up the rx or fax it in somewhere?  She should listen to her body and trust her instincts - if this nausea is atypical for her and persisting without known etiology/source and has not resolved on its own after a few days, then she should likely be seen.   She has been on clonazepam and trazodone for a long time and we have reduced her dose of trazodone already.  If she would like to reduce her dose of clonazepam that would be wonderful as she really needs to worry about the clonazepam/fentanyl interaction - [place] more dangerous than clonazepam/trazodone interaction, esp when we just lowered her trazodone.  However, if she does not think she is getting any benefit from the trazodone then I suppose she can lower her dose further but should definitely NOT do this at the price of being more dependent on the clonazepam instead.

## 2016-03-22 NOTE — Telephone Encounter (Signed)
Narcan sent in

## 2016-03-22 NOTE — Telephone Encounter (Signed)
Pt sees GI Monday for stomach And is back seeing her therapist every other week

## 2016-03-22 NOTE — Telephone Encounter (Signed)
Pt advised of md note.  clonazepam is used 0-2 times per day for panic  Would like narcan rx sent to [facility]

## 2016-04-01 DIAGNOSIS — M1288 Other specific arthropathies, not elsewhere classified, other specified site: Secondary | ICD-10-CM | POA: Diagnosis not present

## 2016-04-01 DIAGNOSIS — G894 Chronic pain syndrome: Secondary | ICD-10-CM | POA: Diagnosis not present

## 2016-04-01 DIAGNOSIS — M797 Fibromyalgia: Secondary | ICD-10-CM | POA: Diagnosis not present

## 2016-04-02 ENCOUNTER — Ambulatory Visit (INDEPENDENT_AMBULATORY_CARE_PROVIDER_SITE_OTHER): Payer: Medicare Other | Admitting: Psychiatry

## 2016-04-02 DIAGNOSIS — F331 Major depressive disorder, recurrent, moderate: Secondary | ICD-10-CM | POA: Diagnosis not present

## 2016-04-09 DIAGNOSIS — H02201 Unspecified lagophthalmos right upper eyelid: Secondary | ICD-10-CM | POA: Diagnosis not present

## 2016-04-09 DIAGNOSIS — H16103 Unspecified superficial keratitis, bilateral: Secondary | ICD-10-CM | POA: Diagnosis not present

## 2016-04-09 DIAGNOSIS — H02202 Unspecified lagophthalmos right lower eyelid: Secondary | ICD-10-CM | POA: Diagnosis not present

## 2016-04-09 DIAGNOSIS — H04123 Dry eye syndrome of bilateral lacrimal glands: Secondary | ICD-10-CM | POA: Diagnosis not present

## 2016-04-09 NOTE — Progress Notes (Deleted)
Office Visit Note  Patient: Kayla [name]             Date of Birth: 09-21-1945           MRN: [id number]             PCP: [name], MD Referring: Shawnee Knapp, MD Visit Date: 04/10/2016 Occupation: '@GUAROCC' @    Subjective:  No chief complaint on file.   History of Present Illness: Kayla [name] is a 71 y.o. female ***   Activities of Daily Living:  Patient reports morning stiffness for *** {minute/hour:19697}.   Patient {ACTIONS;DENIES/REPORTS:21021675::"Denies"} nocturnal pain.  Difficulty dressing/grooming: {ACTIONS;DENIES/REPORTS:21021675::"Denies"} Difficulty climbing stairs: {ACTIONS;DENIES/REPORTS:21021675::"Denies"} Difficulty getting out of chair: {ACTIONS;DENIES/REPORTS:21021675::"Denies"} Difficulty using hands for taps, buttons, cutlery, and/or writing: {ACTIONS;DENIES/REPORTS:21021675::"Denies"}   No Rheumatology ROS completed.   PMFS History:  Patient Active Problem List   Diagnosis Date Noted  . Peripheral vascular disease ([place]) 01/22/2016  . Hypertension 01/22/2016  . Diabetes mellitus 01/22/2016  . H/O hyperparathyroidism 01/22/2016  . Chest pain 12/11/2015  . Mitral regurgitation 12/11/2015  . Vitamin D deficiency 03/08/2015  . H/O gastric bypass 02/11/2014  . Chronic maxillary sinusitis 09/15/2013  . Personal history of colonic polyps 07/22/2013  . HA (headache)-chronic/tension type 07/28/2012  . Osteoporosis 07/28/2012  . Unspecified vitamin D deficiency-2009 07/28/2012  . Nephrolithiasis 07/28/2012  . Fibromyalgia 07/26/2012  . Chronic interstitial cystitis 10/16/2011  . Chronic constipation 10/04/2010  . WEIGHT LOSS 11/27/2009  . PERSONAL HISTORY OF FAILED MODERATE SEDATION 06/01/2008  . Anxiety state 03/18/2007  . Reactive depression 03/18/2007  . GERD 03/18/2007  . Diaphragmatic hernia 03/18/2007  . Osteoarthritis 03/18/2007  . [place] DISEASE, CERVICAL 03/18/2007  . [place] DISEASE, LUMBAR 03/18/2007  . Myalgia and myositis 03/18/2007     Past Medical History:  Diagnosis Date  . Allergy   . Anemia    anemia in the past  . Angina    takes Propanolol prn and it stops her chest  . Anxiety   . Barrett esophagus    not consistently present  . Cataract   . Chronic kidney disease    kidney stones and UTI  . Complication of anesthesia    either  BP or pulse dropped with last surgery  . DDD (degenerative disc disease)    cervical and lumbar  . Dementia   . Depression    post traumatic stress  disorder  . Diabetes mellitus    sugar goes up and down diet controlled  . Dysrhythmia    brought on by stress  . Fatigue   . Fibromyalgia    neuropathy knees ankles and toes, bladder  . GERD (gastroesophageal reflux disease)   . H/O hyperparathyroidism   . Headache   . Headache(784.0)    migraines  . Hiatal hernia   . History of kidney stones   . Hypertension    3 small brain aneurysms  . IBS (irritable bowel syndrome)   . Macular degeneration   . Migraines   . Osteoarthritis    all over  . Osteopetrosis   . Osteoporosis   . Peripheral vascular disease (H[facility])    superficial phlebitis in left calf  . Personal history of colonic polyps 07/22/2013   07/2013 - 3 small adenomas - repeat colonoscopy 2018    Family History  Problem Relation Age of Onset  . Stroke Mother   . Lung cancer Mother   . Heart disease Mother   . Diabetes Mother   . Hypertension Father   .  Stroke Father   . Lung cancer Father   . Heart disease Father   . Kidney disease Father   . Seizures Father     epilepsy, and sisters x 2  . Pancreatic cancer Sister   . Lung cancer Sister   . Colon cancer Maternal Aunt     12 relatives  . Uterine cancer      aunt  . Heart disease      grandmother  . Clotting disorder Sister   . Heart disease Sister     x 2  . Kidney disease Sister     x 2  . Breast cancer Sister   . Colon cancer Paternal Aunt   . Colon cancer Paternal Aunt    Past Surgical History:  Procedure Laterality Date  .  ABDOMINAL HYSTERECTOMY    . APPENDECTOMY    . CARDIAC CATHETERIZATION  03/22/1991   EF 61%  . CARDIOVASCULAR STRESS TEST  10/03/2006  . CHOLECYSTECTOMY    . COLONOSCOPY  06/23/2008   normal  . DILATION AND CURETTAGE OF UTERUS     x2  . ESOPHAGUS SURGERY    . EXPLORATORY LAPAROTOMY  1993  . GASTRIC BYPASS  1999  . GASTRIC RESTRICTION SURGERY  1991  . Right Arm Surgery    . Right Knee Arthroscopy    . Rt wrist fx  2009  . stomach stappeling  1991  . STONE EXTRACTION WITH BASKET  2012  . TONSILLECTOMY    . TUBAL LIGATION    . UPPER GASTROINTESTINAL ENDOSCOPY  06/23/2008   w/Dil, Barrett's esophagus  . US ECHOCARDIOGRAPHY  01/21/2007   EF 55-60%  . US ECHOCARDIOGRAPHY  08/30/2003   EF 55-60%   Social History   Social History Narrative  . No narrative on file     Objective: Vital Signs: There were no vitals taken for this visit.   Physical Exam   Musculoskeletal Exam: ***  CDAI Exam: No CDAI exam completed.    Investigation: Findings:  01/22/2015 CBC normal, CMP normal, UA normal, lipid panel LDL 108, TSH normal, hemoglobin A1c 5.3, vitamin D 34.4, ESR normal, CRP normal, ANA negative, 03/08/2015 hepatitis C antibody negative    Imaging: No results found.  Speciality Comments: No specialty comments available.    Procedures:  No procedures performed Allergies: Ambien [zolpidem tartrate]; Codeine; Oxycodone-acetaminophen; Tylox [oxycodone-acetaminophen]; Darvocet [propoxyphene n-acetaminophen]; Darvon [propoxyphene]; Food; Lorcet [hydrocodone-acetaminophen]; Opium; Vicodin [hydrocodone-acetaminophen]; Wheat bran; Zolpidem tartrate; Zolpidem tartrate; Amoxicillin; and Penicillins   Assessment / Plan:     Visit Diagnoses: Other type of osteoarthritis, unspecified site  DISC DISEASE, CERVICAL  DISC DISEASE, LUMBAR  Age-related osteoporosis without current pathological fracture  Fibromyalgia  H/O gastric bypass  Vitamin D deficiency  Peripheral vascular  disease ([place])  Essential hypertension  History of diabetes mellitus  Anxiety state  Reactive depression    Orders: No orders of the defined types were placed in this encounter.  No orders of the defined types were placed in this encounter.   Face-to-face time spent with patient was *** minutes. 50% of time was spent in counseling and coordination of care.  Follow-Up Instructions: No Follow-up on file.   Bo Merino, MD  Note - This record has been created using Editor, commissioning.  Chart creation errors have been sought, but may not always  have been located. Such creation errors do not reflect on  the standard of medical care.

## 2016-04-10 ENCOUNTER — Ambulatory Visit: Payer: Medicare Other | Admitting: Rheumatology

## 2016-04-16 ENCOUNTER — Ambulatory Visit (INDEPENDENT_AMBULATORY_CARE_PROVIDER_SITE_OTHER): Payer: Medicare Other | Admitting: Psychiatry

## 2016-04-16 DIAGNOSIS — F331 Major depressive disorder, recurrent, moderate: Secondary | ICD-10-CM | POA: Diagnosis not present

## 2016-04-19 ENCOUNTER — Telehealth: Payer: Self-pay | Admitting: Family Medicine

## 2016-04-19 DIAGNOSIS — R519 Headache, unspecified: Secondary | ICD-10-CM

## 2016-04-19 DIAGNOSIS — R51 Headache: Secondary | ICD-10-CM

## 2016-04-19 DIAGNOSIS — M797 Fibromyalgia: Secondary | ICD-10-CM

## 2016-04-19 DIAGNOSIS — G8929 Other chronic pain: Secondary | ICD-10-CM

## 2016-04-19 DIAGNOSIS — M5030 Other cervical disc degeneration, unspecified cervical region: Secondary | ICD-10-CM

## 2016-04-19 NOTE — Telephone Encounter (Signed)
Please place and resend referral if appropriate

## 2016-04-19 NOTE — Telephone Encounter (Signed)
[place] [facility] called pt a few days ago to set up appt from referral but pt mistook this for a plastic surgery appt and canceled it. She realized this was for osteoporsis and did not mean to cancel appt. Pt wondering if she could be referred again or what she could do. Pt also wanted to let Dr. Brigitte Pulse know she saw pain management doctor and she returns to get a knee brace on 4/12. This doctor also referred her to chiropractor for neck and back and sees Dr. Jola Baptist on 4/18. Also wants pt to see physical therapist and needs referral. Best pt callback number [phone].

## 2016-04-19 NOTE — Telephone Encounter (Signed)
Thanks for the update. Does Dr. Jimmye Norman or her pain mngmnt doctor have any recommendations on the type or nature of the PT or who/where/? Does pt have preference?  We do not need to place a new referral to rheum - pt can just call rheum back directly and they will be happy to reschedule her. I'm sure they will understand the miscommunication.

## 2016-04-25 ENCOUNTER — Telehealth: Payer: Self-pay | Admitting: Neurology

## 2016-04-25 NOTE — Telephone Encounter (Signed)
Caller: Kayla [name]  Urgent? No  Reason for the call: Had to cancel MRI that was scheduled due to husbands health. Would like to reschedule it. Also, has a question about a couple messages in her My Chart. Thanks

## 2016-04-25 NOTE — Telephone Encounter (Signed)
Sounds great. Thanks for the info. Referral placed.

## 2016-04-25 NOTE — Telephone Encounter (Signed)
Last ov 02/2016

## 2016-04-25 NOTE — Telephone Encounter (Signed)
Returned call. No answer.  

## 2016-04-25 NOTE — Telephone Encounter (Signed)
Called pt and left vm that she should be able to call and schedule again with Rheum. Also asked pt to give [place] a callback so we could get more information about Physical Therapy.

## 2016-04-25 NOTE — Telephone Encounter (Signed)
Pain Management Doctor Rainwater requested pt to have Physical Therapy. [place] and Medicare will pay for Physical Therapy as long as it is said to be medically necessary by Dr. Brigitte Pulse. Pt used to go to [facility] but they do not accept [facility] or Medicare. Doctor Rainwater at [facility] wanted PT for neck.. Pt also has appt with Rheum on 5/1 at 1:15pm. Pt also says she has an appt on 5/9 to [facility] to consider taking growth off of eyelids and possible surgery for eyes due to pt not being able to close them at night.

## 2016-04-27 ENCOUNTER — Ambulatory Visit: Payer: Medicare Other

## 2016-04-30 ENCOUNTER — Ambulatory Visit (INDEPENDENT_AMBULATORY_CARE_PROVIDER_SITE_OTHER): Payer: Medicare Other | Admitting: Psychiatry

## 2016-04-30 DIAGNOSIS — F331 Major depressive disorder, recurrent, moderate: Secondary | ICD-10-CM

## 2016-04-30 NOTE — Telephone Encounter (Signed)
Spoke to patient. She said she was in [place] and she would call back later.

## 2016-05-06 ENCOUNTER — Ambulatory Visit: Payer: Medicare Other | Attending: Family Medicine

## 2016-05-06 DIAGNOSIS — M436 Torticollis: Secondary | ICD-10-CM | POA: Diagnosis not present

## 2016-05-06 DIAGNOSIS — G8929 Other chronic pain: Secondary | ICD-10-CM | POA: Diagnosis not present

## 2016-05-06 DIAGNOSIS — M5090 Cervical disc disorder, unspecified, unspecified cervical region: Secondary | ICD-10-CM | POA: Insufficient documentation

## 2016-05-06 DIAGNOSIS — M6281 Muscle weakness (generalized): Secondary | ICD-10-CM | POA: Diagnosis not present

## 2016-05-06 DIAGNOSIS — R252 Cramp and spasm: Secondary | ICD-10-CM | POA: Diagnosis not present

## 2016-05-06 DIAGNOSIS — M542 Cervicalgia: Secondary | ICD-10-CM | POA: Diagnosis not present

## 2016-05-06 DIAGNOSIS — R293 Abnormal posture: Secondary | ICD-10-CM | POA: Diagnosis not present

## 2016-05-06 NOTE — Patient Instructions (Signed)
Issued dry needle handout and cross postural syndrome handout and neck retraction and scapula retraction exercises 2-10x/day 2-10 reps with 3-5 sec hold

## 2016-05-06 NOTE — Therapy (Signed)
[place], [place], [address] Phone: [phone]   Fax:  [phone]  Physical Therapy Evaluation  Patient Details  Name: Kayla [name] MRN: [id number] Date of Birth: Jun 23, 1945 Referring Provider: [name], MD  Encounter Date: 05/06/2016      PT End of Session - 05/06/16 1335    Visit Number 1   Number of Visits 12   Date for PT Re-Evaluation 06/14/16   Authorization Type Medicare   PT Start Time 0120   PT Stop Time 0210   PT Time Calculation (min) 50 min   Activity Tolerance Patient tolerated treatment well;No increased pain   Behavior During Therapy WFL for tasks assessed/performed      Past Medical History:  Diagnosis Date  . Allergy   . Anemia    anemia in the past  . Angina    takes Propanolol prn and it stops her chest  . Anxiety   . Barrett esophagus    not consistently present  . Cataract   . Chronic kidney disease    kidney stones and UTI  . Complication of anesthesia    either  BP or pulse dropped with last surgery  . DDD (degenerative disc disease)    cervical and lumbar  . Dementia   . Depression    post traumatic stress  disorder  . Diabetes mellitus    sugar goes up and down diet controlled  . Dysrhythmia    brought on by stress  . Fatigue   . Fibromyalgia    neuropathy knees ankles and toes, bladder  . GERD (gastroesophageal reflux disease)   . H/O hyperparathyroidism   . Headache   . Headache(784.0)    migraines  . Hiatal hernia   . History of kidney stones   . Hypertension    3 small brain aneurysms  . IBS (irritable bowel syndrome)   . Macular degeneration   . Migraines   . Osteoarthritis    all over  . Osteopetrosis   . Osteoporosis   . Peripheral vascular disease (H[facility])    superficial phlebitis in left calf  . Personal history of colonic polyps 07/22/2013   07/2013 - 3 small adenomas - repeat colonoscopy 2018    Past Surgical History:  Procedure  Laterality Date  . ABDOMINAL HYSTERECTOMY    . APPENDECTOMY    . CARDIAC CATHETERIZATION  03/22/1991   EF 61%  . CARDIOVASCULAR STRESS TEST  10/03/2006  . CHOLECYSTECTOMY    . COLONOSCOPY  06/23/2008   normal  . DILATION AND CURETTAGE OF UTERUS     x2  . ESOPHAGUS SURGERY    . EXPLORATORY LAPAROTOMY  1993  . GASTRIC BYPASS  1999  . GASTRIC RESTRICTION SURGERY  1991  . Right Arm Surgery    . Right Knee Arthroscopy    . Rt wrist fx  2009  . stomach stappeling  1991  . STONE EXTRACTION WITH BASKET  2012  . TONSILLECTOMY    . TUBAL LIGATION    . UPPER GASTROINTESTINAL ENDOSCOPY  06/23/2008   w/Dil, Barrett's esophagus  . US ECHOCARDIOGRAPHY  01/21/2007   EF 55-60%  . US ECHOCARDIOGRAPHY  08/30/2003   EF 55-60%    There were no vitals filed for this visit.       Subjective Assessment - 05/06/16 1325    Subjective She reports neck pain with spurring , DDD. Pinched nerves , Treatment with cortisone and nerve ablation . With min to  no benefit. . She has not had treatment  in past with neck. .    She uses TENS and volatren gel help most.     Pertinent History FM   Limitations Lifting;House hold activities  turning neck    Patient Stated Goals Decrease pain and improve motion   Currently in Pain? Yes   Pain Score 6    Pain Location Neck   Pain Orientation Left;Posterior   Pain Descriptors / Indicators Aching;Sharp;Dull   Pain Type Chronic pain   Pain Onset More than a month ago   Pain Frequency Constant   Aggravating Factors  Moving neck,    Pain Relieving Factors TENS, volatren  gel   Multiple Pain Sites --  yes but not addressed in Pt now            [facility] PT Assessment - 05/06/16 0001      Assessment   Medical Diagnosis Cervical DDD   Referring Provider Kayla Cheadle, MD   Onset Date/Surgical Date --  5-10 years ago   Hand Dominance Right   Next MD Visit 05/2016    Prior Therapy Not to neck     Precautions   Precautions None     Restrictions   Weight Bearing  Restrictions No     Balance Screen   Has the patient fallen in the past 6 months No   Has the patient had a decrease in activity level because of a fear of falling?  No   Is the patient reluctant to leave their home because of a fear of falling?  No     Prior Function   Level of Independence --  Asseist from spouse with heavy tasks     Cognition   Overall Cognitive Status Within Functional Limits for tasks assessed     Observation/Other Assessments   Focus on Therapeutic Outcomes (FOTO)  63% limited     Posture/Postural Control   Posture Comments Forward head      ROM / Strength   AROM / PROM / Strength AROM;Strength     AROM   Overall AROM Comments Able to retract scapula and retract neck with tactile cues.  Thoracic rotation  35-40 degres bilaterally     AROM Assessment Site Cervical   Cervical Flexion 40   Cervical Extension 40   Cervical - Right Side Bend 25   Cervical - Left Side Bend 20   Cervical - Right Rotation 48   Cervical - Left Rotation 40     Strength   Overall Strength Comments WFL 4+/5 both shoulders     Palpation   Palpation comment Tender LT > RT but RT increased with palpation . Manual traction felt good but painful.  Ropy muscle tender on LT     Ambulation/Gait   Gait Comments Independent                            PT Education - 05/06/16 1405    Education provided Yes   Education Details POC , HEP and DN info   Person(s) Educated Patient   Methods Explanation;Tactile cues;Verbal cues;Handout   Comprehension Verbalized understanding;Returned demonstration          PT Short Term Goals - 05/06/16 1408      PT SHORT TERM GOAL #1   Title she will be independent with initial HEP   Time 3   Period Weeks   Status New     PT  SHORT TERM GOAL #2   Title She will improve cervical rotation to 55 degree bilaterally   Time 3   Period Weeks   Status New     PT SHORT TERM GOAL #3   Title she will report pain decreased 25% or  more with normal activity during day   Time 3   Period Weeks   Status New     PT SHORT TERM GOAL #4   Title She will be able to demo good sitting posture   Time 3   Period Weeks   Status New           PT Long Term Goals - 05/06/16 1409      PT LONG TERM GOAL #1   Title She will be independent with all HEP issued.    Time 6   Period Weeks   Status New     PT LONG TERM GOAL #2   Title she will report pain decreased 50% during normal daily activity   Time 6   Period Weeks   Status New     PT LONG TERM GOAL #3   Title She will improve cervical rotation to 60 degrees or more bilateral to be able to see traffic better with less pain   Time 6   Period Weeks   Status New     PT LONG TERM GOAL #4   Title FOTO score will improve 10 points to demo functional improvement   Time 6   Period Weeks   Status New               Plan - 05/06/16 1406    Clinical Impression Statement Ms Khiev presents for moderate complexity neck eval with chronic neck pain with decreased ROM , spasm , foreward head posture , UE weakness.    Rehab Potential Fair   PT Frequency 2x / week   PT Duration 6 weeks   PT Treatment/Interventions Iontophoresis 4mg /ml Dexamethasone;Moist Heat;Passive range of motion;Patient/family education;Manual techniques;Taping;Dry needling;Therapeutic exercise   PT Next Visit Plan REview posture exer,  stab exercises, heat , STW , manual traction, posture ed   PT Home Exercise Plan cervical and scapula retraction   Consulted and Agree with Plan of Care Patient      Patient will benefit from skilled therapeutic intervention in order to improve the following deficits and impairments:  Decreased range of motion, Pain, Decreased activity tolerance, Decreased strength, Postural dysfunction, Increased muscle spasms  Visit Diagnosis: Cervical disc disease - Plan: PT plan of care cert/re-cert  Stiffness of cervical spine - Plan: PT plan of care  cert/re-cert  Cramp and spasm - Plan: PT plan of care cert/re-cert  Abnormal posture - Plan: PT plan of care cert/re-cert  Muscle weakness (generalized) - Plan: PT plan of care cert/re-cert  Chronic neck pain - Plan: PT plan of care cert/re-cert      G-Codes - 86/57/84 1441    Functional Assessment Tool Used (Outpatient Only) FOTO 63% limited   Functional Limitation Changing and maintaining body position   Changing and Maintaining Body Position Current Status (O9629) At least 60 percent but less than 80 percent impaired, limited or restricted   Changing and Maintaining Body Position Goal Status (B2841) At least 40 percent but less than 60 percent impaired, limited or restricted       Problem List Patient Active Problem List   Diagnosis Date Noted  . Peripheral vascular disease ([place]) 01/22/2016  . Hypertension 01/22/2016  . Diabetes mellitus 01/22/2016  .  H/O hyperparathyroidism 01/22/2016  . Chest pain 12/11/2015  . Mitral regurgitation 12/11/2015  . Vitamin D deficiency 03/08/2015  . H/O gastric bypass 02/11/2014  . Chronic maxillary sinusitis 09/15/2013  . Personal history of colonic polyps 07/22/2013  . HA (headache)-chronic/tension type 07/28/2012  . Osteoporosis 07/28/2012  . Unspecified vitamin D deficiency-2009 07/28/2012  . Nephrolithiasis 07/28/2012  . Fibromyalgia 07/26/2012  . Chronic interstitial cystitis 10/16/2011  . Chronic constipation 10/04/2010  . WEIGHT LOSS 11/27/2009  . PERSONAL HISTORY OF FAILED MODERATE SEDATION 06/01/2008  . Anxiety state 03/18/2007  . Reactive depression 03/18/2007  . GERD 03/18/2007  . Diaphragmatic hernia 03/18/2007  . Osteoarthritis 03/18/2007  . [place] DISEASE, CERVICAL 03/18/2007  . [place] DISEASE, LUMBAR 03/18/2007  . Myalgia and myositis 03/18/2007    Darrel Hoover  PT 05/06/2016, 2:46 PM  [facility] [address] [place], [place], [address] Phone: [phone]    Fax:  [phone]  Name: Kayla [name] MRN: [id number] Date of Birth: Nov 06, 1945

## 2016-05-08 ENCOUNTER — Ambulatory Visit: Payer: Medicare Other | Admitting: Physical Therapy

## 2016-05-08 DIAGNOSIS — M6281 Muscle weakness (generalized): Secondary | ICD-10-CM | POA: Diagnosis not present

## 2016-05-08 DIAGNOSIS — M436 Torticollis: Secondary | ICD-10-CM | POA: Diagnosis not present

## 2016-05-08 DIAGNOSIS — M5090 Cervical disc disorder, unspecified, unspecified cervical region: Secondary | ICD-10-CM

## 2016-05-08 DIAGNOSIS — R293 Abnormal posture: Secondary | ICD-10-CM | POA: Diagnosis not present

## 2016-05-08 DIAGNOSIS — R252 Cramp and spasm: Secondary | ICD-10-CM | POA: Diagnosis not present

## 2016-05-08 DIAGNOSIS — M542 Cervicalgia: Secondary | ICD-10-CM

## 2016-05-08 DIAGNOSIS — G8929 Other chronic pain: Secondary | ICD-10-CM

## 2016-05-08 NOTE — Therapy (Addendum)
[place], [place], [address] Phone: [phone]   Fax:  [phone]  Physical Therapy Treatment/Discharge  Patient Details  Name: QUANTAVIA FRITH MRN: [id number] Date of Birth: Jan 03, 1946 Referring Provider: [name], MD  Encounter Date: 05/08/2016      PT End of Session - 05/08/16 1056    Visit Number 2   Number of Visits 12   Date for PT Re-Evaluation 06/14/16   Authorization Type Medicare   PT Start Time 1030  15 minutes late   PT Stop Time 1058   PT Time Calculation (min) 28 min   Activity Tolerance Patient tolerated treatment well;No increased pain   Behavior During Therapy WFL for tasks assessed/performed      Past Medical History:  Diagnosis Date  . Allergy   . Anemia    anemia in the past  . Angina    takes Propanolol prn and it stops her chest  . Anxiety   . Barrett esophagus    not consistently present  . Cataract   . Chronic kidney disease    kidney stones and UTI  . Complication of anesthesia    either  BP or pulse dropped with last surgery  . DDD (degenerative disc disease)    cervical and lumbar  . Dementia   . Depression    post traumatic stress  disorder  . Diabetes mellitus    sugar goes up and down diet controlled  . Dysrhythmia    brought on by stress  . Fatigue   . Fibromyalgia    neuropathy knees ankles and toes, bladder  . GERD (gastroesophageal reflux disease)   . H/O hyperparathyroidism   . Headache   . Headache(784.0)    migraines  . Hiatal hernia   . History of kidney stones   . Hypertension    3 small brain aneurysms  . IBS (irritable bowel syndrome)   . Macular degeneration   . Migraines   . Osteoarthritis    all over  . Osteopetrosis   . Osteoporosis   . Peripheral vascular disease (HCC)    superficial phlebitis in left calf  . Personal history of colonic polyps 07/22/2013   07/2013 - 3 small adenomas - repeat colonoscopy 2018    Past  Surgical History:  Procedure Laterality Date  . ABDOMINAL HYSTERECTOMY    . APPENDECTOMY    . CARDIAC CATHETERIZATION  03/22/1991   EF 61%  . CARDIOVASCULAR STRESS TEST  10/03/2006  . CHOLECYSTECTOMY    . COLONOSCOPY  06/23/2008   normal  . DILATION AND CURETTAGE OF UTERUS     x2  . ESOPHAGUS SURGERY    . EXPLORATORY LAPAROTOMY  1993  . GASTRIC BYPASS  1999  . GASTRIC RESTRICTION SURGERY  1991  . Right Arm Surgery    . Right Knee Arthroscopy    . Rt wrist fx  2009  . stomach stappeling  1991  . STONE EXTRACTION WITH BASKET  2012  . TONSILLECTOMY    . TUBAL LIGATION    . UPPER GASTROINTESTINAL ENDOSCOPY  06/23/2008   w/Dil, Barrett's esophagus  . US ECHOCARDIOGRAPHY  01/21/2007   EF 55-60%  . US ECHOCARDIOGRAPHY  08/30/2003   EF 55-60%    There were no vitals filed for this visit.      Subjective Assessment - 05/08/16 1041    Subjective Patient states that her resting pain is always a 6/10. It can get to an 8/10 or 9/10 when  doing things that aggravate it. She stated that she responses to pain in a quiet way; she is not into screaming or yelling.   Currently in Pain? Yes   Pain Score 6    Pain Location Neck   Pain Orientation Left;Posterior   Pain Type Chronic pain                         [facility] Adult PT Treatment/Exercise - 05/08/16 0001      Neck Exercises: Seated   Neck Retraction 10 reps   Neck Retraction Limitations verbal cues for proper execution   Cervical Rotation --   Other Seated Exercise cervical flexion and extension 5 x each   Other Seated Exercise Cervical AROM x 5 reps, 5 seconds each      Shoulder Exercises: Supine   Horizontal ABduction Strengthening;Both;10 reps   Theraband Level (Shoulder Horizontal ABduction) Level 1 (Yellow)   External Rotation Strengthening;Both;10 reps   External Rotation Limitations pt needs pillow under sacrum for comfort.    Flexion --   Flexion Limitations --   Other Supine Exercises Thoracic extension  over towel roll, with shoulder flexion stretch over head, also pec strech with arms at 90/90 1 minute each      Shoulder Exercises: Seated   Retraction Both;10 reps   Retraction Limitations tactile cues required     Neck Exercises: Stretches   Upper Trapezius Stretch 3 reps;10 seconds   Levator Stretch 3 reps;10 seconds   Corner Stretch 3 reps;10 seconds                  PT Short Term Goals - 05/06/16 1408      PT SHORT TERM GOAL #1   Title she will be independent with initial HEP   Time 3   Period Weeks   Status New     PT SHORT TERM GOAL #2   Title She will improve cervical rotation to 55 degree bilaterally   Time 3   Period Weeks   Status New     PT SHORT TERM GOAL #3   Title she will report pain decreased 25% or more with normal activity during day   Time 3   Period Weeks   Status New     PT SHORT TERM GOAL #4   Title She will be able to demo good sitting posture   Time 3   Period Weeks   Status New           PT Long Term Goals - 05/06/16 1409      PT LONG TERM GOAL #1   Title She will be independent with all HEP issued.    Time 6   Period Weeks   Status New     PT LONG TERM GOAL #2   Title she will report pain decreased 50% during normal daily activity   Time 6   Period Weeks   Status New     PT LONG TERM GOAL #3   Title She will improve cervical rotation to 60 degrees or more bilateral to be able to see traffic better with less pain   Time 6   Period Weeks   Status New     PT LONG TERM GOAL #4   Title FOTO score will improve 10 points to demo functional improvement   Time 6   Period Weeks   Status New  Plan - 05/08/16 1040    Clinical Impression Statement Patient was 15 minutes late due to writing down the wrong appointment time. Reviewed scapular retraction and chin tucks. She reports increased pain with scapular retraction along thoracic spine.  Required cues to keep shoulders down and to pull shoulder  blades together.  Patient is ready to get the most out of PT and wants to continue despite pain. Added other exercises from Cross Postural Syndrome HEP. Cues to keep in a comfortable ROM. Pt reports she will have a chiropractor consult soon. She was not referred to Chiropractor for adjustments due to osteoporosis however she thinks it is for deep muscle treatment? She will bring referral for clarification. She has researched dry needling and would like to avoid trigger pont therapy due to the pain she experiences during massage from husband sometimes.    Rehab Potential Fair   PT Frequency 2x / week   PT Duration 6 weeks   PT Treatment/Interventions Iontophoresis 69m/ml Dexamethasone;Moist Heat;Passive range of motion;Patient/family education;Manual techniques;Taping;Dry needling;Therapeutic exercise   PT Next Visit Plan REview posture exer,  stab exercises, heat , STW , manual traction, posture ed; did she bring chiropractor referral (? deep tissue work y cRisk manager)   PT Home Exercise Plan cervical and scapula retraction, upper trap stretch, levator stretch, pec stretch, all in comfortable ROM    Consulted and Agree with Plan of Care Patient      Patient will benefit from skilled therapeutic intervention in order to improve the following deficits and impairments:  Decreased range of motion, Pain, Decreased activity tolerance, Decreased strength, Postural dysfunction, Increased muscle spasms  Visit Diagnosis: Cervical disc disease  Stiffness of cervical spine  Abnormal posture  Cramp and spasm  Muscle weakness (generalized)  Chronic neck pain     Problem List Patient Active Problem List   Diagnosis Date Noted  . Peripheral vascular disease ([place] 01/22/2016  . Hypertension 01/22/2016  . Diabetes mellitus 01/22/2016  . H/O hyperparathyroidism 01/22/2016  . Chest pain 12/11/2015  . Mitral regurgitation 12/11/2015  . Vitamin D deficiency 03/08/2015  . H/O gastric bypass 02/11/2014  .  Chronic maxillary sinusitis 09/15/2013  . Personal history of colonic polyps 07/22/2013  . HA (headache)-chronic/tension type 07/28/2012  . Osteoporosis 07/28/2012  . Unspecified vitamin D deficiency-2009 07/28/2012  . Nephrolithiasis 07/28/2012  . Fibromyalgia 07/26/2012  . Chronic interstitial cystitis 10/16/2011  . Chronic constipation 10/04/2010  . WEIGHT LOSS 11/27/2009  . PERSONAL HISTORY OF FAILED MODERATE SEDATION 06/01/2008  . Anxiety state 03/18/2007  . Reactive depression 03/18/2007  . GERD 03/18/2007  . Diaphragmatic hernia 03/18/2007  . Osteoarthritis 03/18/2007  . [place]EASE, CERVICAL 03/18/2007  . [place]EASE, LUMBAR 03/18/2007  . Myalgia and myositis 03/18/2007    DDorene Ar PDelaware4/25/2018, 11:26 AM  [place] [address]Phone: [phone]  Fax:  [phone] Name: KRICKAYLA WIELANDMRN: [id number] of Birth: 801-12-1945 PHYSICAL THERAPY DISCHARGE SUMMARY  Visits from Start of Care: 2  Current functional level related to goals / functional outcomes: She canceled after this visit reporting possible shingles. She did not return   Remaining deficits: Unknown   Education / Equipment: NA Plan:  Patient goals were not met. Patient is being discharged due to the patient's request.  ?????   Noralee Stain, PT 05/23/16   12:12 PM

## 2016-05-13 ENCOUNTER — Telehealth: Payer: Self-pay

## 2016-05-13 ENCOUNTER — Ambulatory Visit: Payer: Medicare Other

## 2016-05-13 DIAGNOSIS — H04123 Dry eye syndrome of bilateral lacrimal glands: Secondary | ICD-10-CM | POA: Diagnosis not present

## 2016-05-13 NOTE — Telephone Encounter (Signed)
Message left reminding of missed appointment  at 12:30 PM today and next appointment on 05/17/16 and she should call if she is not able to attend

## 2016-05-14 ENCOUNTER — Ambulatory Visit: Payer: Medicare Other | Admitting: Rheumatology

## 2016-05-14 ENCOUNTER — Ambulatory Visit (INDEPENDENT_AMBULATORY_CARE_PROVIDER_SITE_OTHER): Payer: Medicare Other | Admitting: Psychiatry

## 2016-05-14 DIAGNOSIS — F331 Major depressive disorder, recurrent, moderate: Secondary | ICD-10-CM | POA: Diagnosis not present

## 2016-05-15 DIAGNOSIS — Z87440 Personal history of urinary (tract) infections: Secondary | ICD-10-CM | POA: Diagnosis not present

## 2016-05-15 DIAGNOSIS — N3946 Mixed incontinence: Secondary | ICD-10-CM | POA: Diagnosis not present

## 2016-05-17 ENCOUNTER — Ambulatory Visit: Payer: Medicare Other | Attending: Family Medicine | Admitting: Physical Therapy

## 2016-05-17 ENCOUNTER — Telehealth: Payer: Self-pay | Admitting: Physical Therapy

## 2016-05-17 NOTE — Telephone Encounter (Signed)
Attempted to reach patient regarding her second no-show. Left message informing her that all of her appointments have been canceled and that she will need to call to get rescheduled if she would like to continue Physical Therapy.

## 2016-05-20 ENCOUNTER — Ambulatory Visit: Payer: Medicare Other | Admitting: Physical Therapy

## 2016-05-22 DIAGNOSIS — H04129 Dry eye syndrome of unspecified lacrimal gland: Secondary | ICD-10-CM | POA: Diagnosis not present

## 2016-05-22 DIAGNOSIS — H029 Unspecified disorder of eyelid: Secondary | ICD-10-CM | POA: Diagnosis not present

## 2016-05-24 ENCOUNTER — Ambulatory Visit: Payer: Medicare Other | Admitting: Physical Therapy

## 2016-05-27 ENCOUNTER — Ambulatory Visit: Payer: Medicare Other | Admitting: Family Medicine

## 2016-05-29 ENCOUNTER — Ambulatory Visit: Payer: Medicare Other | Admitting: Physical Therapy

## 2016-05-31 ENCOUNTER — Ambulatory Visit: Payer: Medicare Other

## 2016-06-13 ENCOUNTER — Ambulatory Visit: Payer: Self-pay | Admitting: Neurology

## 2016-06-17 ENCOUNTER — Encounter: Payer: Self-pay | Admitting: Neurology

## 2016-06-24 ENCOUNTER — Ambulatory Visit: Payer: Medicare Other | Admitting: Family Medicine

## 2016-06-25 ENCOUNTER — Ambulatory Visit: Payer: Medicare Other | Admitting: Psychiatry

## 2016-06-26 ENCOUNTER — Ambulatory Visit: Payer: Medicare Other | Admitting: Clinical

## 2016-06-26 ENCOUNTER — Ambulatory Visit: Payer: Self-pay | Admitting: Clinical

## 2016-06-26 DIAGNOSIS — M5011 Cervical disc disorder with radiculopathy,  high cervical region: Secondary | ICD-10-CM | POA: Diagnosis not present

## 2016-06-26 DIAGNOSIS — M9971 Connective tissue and disc stenosis of intervertebral foramina of cervical region: Secondary | ICD-10-CM | POA: Diagnosis not present

## 2016-06-26 DIAGNOSIS — M4692 Unspecified inflammatory spondylopathy, cervical region: Secondary | ICD-10-CM | POA: Diagnosis not present

## 2016-06-26 DIAGNOSIS — M5033 Other cervical disc degeneration, cervicothoracic region: Secondary | ICD-10-CM | POA: Diagnosis not present

## 2016-06-26 NOTE — Telephone Encounter (Signed)
error 

## 2016-06-27 ENCOUNTER — Ambulatory Visit (INDEPENDENT_AMBULATORY_CARE_PROVIDER_SITE_OTHER): Payer: Medicare Other | Admitting: Family Medicine

## 2016-06-27 ENCOUNTER — Encounter: Payer: Self-pay | Admitting: Family Medicine

## 2016-06-27 VITALS — BP 100/59 | HR 76 | Temp 98.4°F | Resp 16 | Ht 65.5 in | Wt 109.0 lb

## 2016-06-27 DIAGNOSIS — R197 Diarrhea, unspecified: Secondary | ICD-10-CM | POA: Diagnosis not present

## 2016-06-27 DIAGNOSIS — F1190 Opioid use, unspecified, uncomplicated: Secondary | ICD-10-CM

## 2016-06-27 DIAGNOSIS — R7309 Other abnormal glucose: Secondary | ICD-10-CM | POA: Diagnosis not present

## 2016-06-27 DIAGNOSIS — I10 Essential (primary) hypertension: Secondary | ICD-10-CM | POA: Diagnosis not present

## 2016-06-27 DIAGNOSIS — R110 Nausea: Secondary | ICD-10-CM | POA: Diagnosis not present

## 2016-06-27 DIAGNOSIS — Z9884 Bariatric surgery status: Secondary | ICD-10-CM | POA: Diagnosis not present

## 2016-06-27 LAB — POCT CBC
Granulocyte percent: 50.6 %G (ref 37–80)
HCT, POC: 36.4 % — AB (ref 37.7–47.9)
HEMOGLOBIN: 13 g/dL (ref 12.2–16.2)
Lymph, poc: 2.6 (ref 0.6–3.4)
MCH: 31.3 pg — AB (ref 27–31.2)
MCHC: 35.6 g/dL — AB (ref 31.8–35.4)
MCV: 87.7 fL (ref 80–97)
MID (cbc): 0.4 (ref 0–0.9)
MPV: 6.7 fL (ref 0–99.8)
POC Granulocyte: 3.1 (ref 2–6.9)
POC LYMPH PERCENT: 42.3 %L (ref 10–50)
POC MID %: 7.1 % (ref 0–12)
Platelet Count, POC: 247 10*3/uL (ref 142–424)
RBC: 4.15 M/uL (ref 4.04–5.48)
RDW, POC: 14.1 %
WBC: 6.1 10*3/uL (ref 4.6–10.2)

## 2016-06-27 LAB — COMPREHENSIVE METABOLIC PANEL
A/G RATIO: 1.8 (ref 1.2–2.2)
ALK PHOS: 123 IU/L — AB (ref 39–117)
ALT: 35 IU/L — ABNORMAL HIGH (ref 0–32)
AST: 24 IU/L (ref 0–40)
Albumin: 3.9 g/dL (ref 3.6–4.8)
BUN/Creatinine Ratio: 25 (ref 12–28)
BUN: 16 mg/dL (ref 8–27)
Bilirubin Total: 0.4 mg/dL (ref 0.0–1.2)
CO2: 29 mmol/L (ref 20–29)
Calcium: 9.9 mg/dL (ref 8.7–10.3)
Chloride: 101 mmol/L (ref 96–106)
Creatinine, Ser: 0.63 mg/dL (ref 0.57–1.00)
GFR calc Af Amer: 107 mL/min/{1.73_m2} (ref >59)
GFR calc non Af Amer: 92 mL/min/{1.73_m2} (ref >59)
GLOBULIN, TOTAL: 2.2 g/dL (ref 1.5–4.5)
Glucose: 95 mg/dL (ref 65–99)
POTASSIUM: 5.1 mmol/L (ref 3.5–5.2)
SODIUM: 141 mmol/L (ref 134–144)
Total Protein: 6.1 g/dL (ref 6.0–8.5)

## 2016-06-27 LAB — POCT URINALYSIS DIP (MANUAL ENTRY)
BILIRUBIN UA: NEGATIVE mg/dL
Bilirubin, UA: NEGATIVE
Glucose, UA: NEGATIVE mg/dL
Leukocytes, UA: NEGATIVE
NITRITE UA: NEGATIVE
PH UA: 7 (ref 5.0–8.0)
PROTEIN UA: NEGATIVE mg/dL
RBC UA: NEGATIVE
Spec Grav, UA: 1.02 (ref 1.010–1.025)
Urobilinogen, UA: 2 E.U./dL — AB

## 2016-06-27 LAB — GLUCOSE, POCT (MANUAL RESULT ENTRY): POC Glucose: 111 mg/dl — AB (ref 70–99)

## 2016-06-27 MED ORDER — ONDANSETRON HCL 8 MG PO TABS: 8.0000 mg | ORAL_TABLET | Freq: Three times a day (TID) | ORAL | 0 refills | Status: DC | PRN

## 2016-06-27 NOTE — Patient Instructions (Addendum)
IF you received an x-ray today, you will receive an invoice from [facility]. Please contact [facility] at [phone] with questions or concerns regarding your invoice.   IF you received labwork today, you will receive an invoice from [place]. Please contact [facility] at [phone] with questions or concerns regarding your invoice.   Our billing staff will not be able to assist you with questions regarding bills from these companies.  You will be contacted with the lab results as soon as they are available. The fastest way to get your results is to activate your My Chart account. Instructions are located on the last page of this paperwork. If you have not heard from [place] regarding the results in 2 weeks, please contact this office.      Nausea, Adult Nausea is the feeling of an upset stomach or having to vomit. Nausea on its own is not usually a serious concern, but it may be an early sign of a more serious medical problem. As nausea gets worse, it can lead to vomiting. If vomiting develops, or if you are not able to drink enough fluids, you are at risk of becoming dehydrated. Dehydration can make you tired and thirsty, cause you to have a dry mouth, and decrease how often you urinate. Older adults and people with other diseases or a weak immune system are at higher risk for dehydration. The main goals of treating your nausea are:  To limit repeated nausea episodes.  To prevent vomiting and dehydration.  Follow these instructions at home: Follow instructions from your health care provider about how to care for yourself at home. Eating and drinking Follow these recommendations as told by your health care provider:  Take an oral rehydration solution (ORS). This is a drink that is sold at pharmacies and retail stores.  Drink clear fluids in small amounts as you are able. Clear fluids include water, ice chips, diluted fruit juice, and low-calorie sports  drinks.  Eat bland, easy-to-digest foods in small amounts as you are able. These foods include bananas, applesauce, rice, lean meats, toast, and crackers.  Avoid drinking fluids that contain a lot of sugar or caffeine, such as energy drinks, sports drinks, and soda.  Avoid alcohol.  Avoid spicy or fatty foods.  General instructions  Drink enough fluid to keep your urine clear or pale yellow.  Wash your hands often. If soap and water are not available, use hand sanitizer.  Make sure that all people in your household wash their hands well and often.  Rest at home while you recover.  Take over-the-counter and prescription medicines only as told by your health care provider.  Breathe slowly and deeply when you feel nauseous.  Watch your condition for any changes.  Keep all follow-up visits as told by your health care provider. This is important. Contact a health care provider if:  You have a headache.  You have new symptoms.  Your nausea gets worse.  You have a fever.  You feel light-headed or dizzy.  You vomit.  You cannot keep fluids down. Get help right away if:  You have pain in your chest, neck, arm, or jaw.  You feel extremely weak or you faint.  You have vomit that is bright red or looks like coffee grounds.  You have bloody or black stools or stools that look like tar.  You have a severe headache, a stiff neck, or both.  You have severe pain, cramping, or bloating in your abdomen.  You have a rash.  You have difficulty breathing or are breathing very quickly.  Your heart is beating very quickly.  Your skin feels cold and clammy.  You feel confused.  You have pain when you urinate.  You have signs of dehydration, such as: ? Dark urine, very little, or no urine. ? Cracked lips. ? Dry mouth. ? Sunken eyes. ? Sleepiness. ? Weakness. These symptoms may represent a serious problem that is an emergency. Do not wait to see if the symptoms will go  away. Get medical help right away. Call your local emergency services (911 in the [place] Do not drive yourself to the hospital. This information is not intended to replace advice given to you by your health care provider. Make sure you discuss any questions you have with your health care provider. Document Released: 02/08/2004 Document Revised: 06/05/2015 Document Reviewed: 09/06/2014 Elsevier Interactive Patient Education  2017 [employer].

## 2016-06-27 NOTE — Progress Notes (Signed)
Chief Complaint  Patient presents with  . Nausea    Pt having severe nausea   . Tachycardia    States she has episodes of tachycardia unrelated to activity     HPI  Nausea and Abdominal Distention Pt reports that she has been having a month of nausea and is getting more severe She has a Roux En Y bypass to prevent Barrett's esophagus becoming cancer She reports that she always has issues with her stomach She now reports that everytime she tries to eat she gets even more a nausea This is leading to difficulty with maintenance of weight She reports that she has lost 3 pounds over the past week She reports that with eating carbohydrates she gets bloating of the abdomen.  Tachycardia She was diagnosed with mitral valve prolapse-mitral regurgitation. Her cardiologist is Dr. Acie Fredrickson.  She also has a history of hypertension and angina. She reports that she 139/90s She reports that her pulse was 70-80s. Her bp is 130s/80s. She took her propranolol due to mild angina and some increase in heart rate.   Chronic opioid use She uses fentanyl patches She is wondering if her decreased weight might make her dose too high She typically has constipation and now is having diarrhea  Past Medical History:  Diagnosis Date  . Allergy   . Anemia    anemia in the past  . Angina    takes Propanolol prn and it stops her chest  . Anxiety   . Barrett esophagus    not consistently present  . Cataract   . Chronic kidney disease    kidney stones and UTI  . Complication of anesthesia    either  BP or pulse dropped with last surgery  . DDD (degenerative disc disease)    cervical and lumbar  . Dementia   . Depression    post traumatic stress  disorder  . Diabetes mellitus    sugar goes up and down diet controlled  . Dysrhythmia    brought on by stress  . Fatigue   . Fibromyalgia    neuropathy knees ankles and toes, bladder  . GERD (gastroesophageal reflux disease)   . H/O hyperparathyroidism    . Headache   . Headache(784.0)    migraines  . Hiatal hernia   . History of kidney stones   . Hypertension    3 small brain aneurysms  . IBS (irritable bowel syndrome)   . Macular degeneration   . Migraines   . Osteoarthritis    all over  . Osteopetrosis   . Osteoporosis   . Peripheral vascular disease (HCC)    superficial phlebitis in left calf  . Personal history of colonic polyps 07/22/2013   07/2013 - 3 small adenomas - repeat colonoscopy 2018    Current Outpatient Prescriptions  Medication Sig Dispense Refill  . Ascorbic Acid (VITAMIN C) 1000 MG tablet Take 1,000 mg by mouth daily.    . beta carotene w/minerals (OCUVITE) tablet Take 1 tablet by mouth 2 (two) times daily.    Marland Kitchen CALCIUM-MAGNESIUM-ZINC PO Take by mouth daily.    . celecoxib (CELEBREX) 200 MG capsule Take 1 capsule (200 mg total) by mouth daily as needed. 90 capsule 1  . Cholecalciferol (VITAMIN D3) 2000 UNITS TABS Take 2,000 Units by mouth daily.    . clonazePAM (KLONOPIN) 1 MG tablet Take 0.5-1 tablets (0.5-1 mg total) by mouth 2 (two) times daily as needed. anxiety/panic attacks. May fill on or after March 2018. This is  a 3 month supply 180 tablet 1  . Cyanocobalamin (VITAMIN B-12) 1000 MCG SUBL Place 5,000 mcg under the tongue daily.     . cyclobenzaprine (FLEXERIL) 10 MG tablet Take 1 tablet (10 mg total) by mouth 3 (three) times daily as needed for muscle spasms. 270 tablet 1  . diclofenac sodium (VOLTAREN) 1 % GEL Apply 2 g topically 4 (four) times daily. 700 g 1  . dicyclomine (BENTYL) 10 MG capsule Take 1-2 tab 3 times daily AC as needed for spasms and cramping. 270 capsule 1  . diltiazem 2 % GEL Apply 1 application topically 2 (two) times daily. 30 g 0  . docusate sodium (COLACE) 250 MG capsule Take 250 mg by mouth daily.    . DULoxetine (CYMBALTA) 60 MG capsule Take 1 capsule (60 mg total) by mouth daily. 90 capsule 1  . esomeprazole (NEXIUM) 40 MG capsule Take 1 capsule (40 mg total) by mouth daily  before breakfast. 90 capsule 1  . fentaNYL (DURAGESIC - DOSED MCG/HR) 12 MCG/HR Place 12.5 mcg onto the skin every 3 (three) days.    . fentaNYL (DURAGESIC - DOSED MCG/HR) 50 MCG/HR Place 50 mcg onto the skin every 3 (three) days.    Marland Kitchen gabapentin (NEURONTIN) 300 MG capsule Take 1 capsule (300 mg total) by mouth 3 (three) times daily. 270 capsule 1  . hypromellose (SYSTANE OVERNIGHT THERAPY) 0.3 % GEL ophthalmic ointment Place 1 drop into both eyes at bedtime. Reported on 02/11/2015    . lidocaine (LIDODERM) 5 % Place 1 patch onto the skin as needed. Remove & Discard patch within 12 hours. May dispense as 3 month supply 90 patch 3  . Lifitegrast (XIIDRA) 5 % SOLN Apply to eye.    . Loteprednol Etabonate (LOTEMAX) 0.5 % GEL Place 1 drop into both eyes 2 (two) times daily. Reported on 02/11/2015    . mirabegron ER (MYRBETRIQ) 25 MG TB24 tablet Take 1 tablet (25 mg total) by mouth daily. 90 tablet 1  . Naloxone HCl 0.4 MG/0.4ML SOAJ Administer 0.4mg  [place] at first sign of opioid overdose and repeat every 2 minutes as needed for resuscitation. Call 911 immediately 5 Package 0  . Omega-3 Fatty Acids (SEA-OMEGA PO) Take 1,000 mg by mouth daily.    . polyethylene glycol powder (GLYCOLAX/MIRALAX) powder Take 17 g by mouth 2 (two) times daily. Reported on 03/08/2015 850 g 1  . propranolol (INDERAL) 10 MG tablet Take 1 tablet (10 mg total) by mouth 4 (four) times daily. 360 tablet 1  . pyridoxine (B-6) 200 MG tablet Take 200 mg by mouth daily.    . sucralfate (CARAFATE) 1 GM/10ML suspension Take 10 mLs (1 g total) by mouth 4 (four) times daily -  with meals and at bedtime. 420 mL 3  . SUMAtriptan (IMITREX) 100 MG tablet Take 1 tablet at earliest onset of headache, may repeat in 2 hours if headache persists or reoccurs. 30 tablet 1  . traZODone (DESYREL) 100 MG tablet Take 0.5-1 tablets (50-100 mg total) by mouth at bedtime as needed for sleep. And 1/2 -1 if wakes at 4am. 90 tablet 1  . ondansetron (ZOFRAN) 8 MG  tablet Take 1 tablet (8 mg total) by mouth every 8 (eight) hours as needed for nausea or vomiting. 20 tablet 0   No current facility-administered medications for this visit.     Allergies:  Allergies  Allergen Reactions  . Ambien [Zolpidem Tartrate]     Hallucinations  . Codeine Anaphylaxis  . Oxycodone-Acetaminophen  Shortness Of Breath  . Tylox [Oxycodone-Acetaminophen] Anaphylaxis    Chest pain  . Darvocet [Propoxyphene N-Acetaminophen]     Chest pain  . Darvon [Propoxyphene] Itching  . Food Swelling    Peanuts-wheat-  . Lorcet [Hydrocodone-Acetaminophen]     Chest pain  . Opium     hallucinations  . Vicodin [Hydrocodone-Acetaminophen] Other (See Comments)    Chest Pain   . Wheat Bran   . Zolpidem Tartrate Other (See Comments)    Makes her go crazy  . Zolpidem Tartrate     hallucinations  . Amoxicillin Nausea And Vomiting    Reaction:  Migraine headache  . Penicillins Nausea And Vomiting    Migraine Headaches    Past Surgical History:  Procedure Laterality Date  . ABDOMINAL HYSTERECTOMY    . APPENDECTOMY    . CARDIAC CATHETERIZATION  03/22/1991   EF 61%  . CARDIOVASCULAR STRESS TEST  10/03/2006  . CHOLECYSTECTOMY    . COLONOSCOPY  06/23/2008   normal  . DILATION AND CURETTAGE OF UTERUS     x2  . ESOPHAGUS SURGERY    . EXPLORATORY LAPAROTOMY  1993  . GASTRIC BYPASS  1999  . GASTRIC RESTRICTION SURGERY  1991  . Right Arm Surgery    . Right Knee Arthroscopy    . Rt wrist fx  2009  . stomach stappeling  1991  . STONE EXTRACTION WITH BASKET  2012  . TONSILLECTOMY    . TUBAL LIGATION    . UPPER GASTROINTESTINAL ENDOSCOPY  06/23/2008   w/Dil, Barrett's esophagus  . US ECHOCARDIOGRAPHY  01/21/2007   EF 55-60%  . US ECHOCARDIOGRAPHY  08/30/2003   EF 55-60%    Social History   Social History  . Marital status: Married    Spouse name: Rushie Chestnut.  . Number of children: 2  . Years of education: N/A   Occupational History  . Retired   .  Retired   Social  History Main Topics  . Smoking status: Never Smoker  . Smokeless tobacco: Never Used  . Alcohol use No  . Drug use: No  . Sexual activity: Not Currently   Other Topics Concern  . None   Social History Narrative  . None    Review of Systems  Constitutional: Positive for malaise/fatigue and weight loss. Negative for chills and fever.  HENT: Negative for congestion and tinnitus.   Eyes: Negative for blurred vision and double vision.  Respiratory: Negative for cough, shortness of breath and wheezing.   Cardiovascular: Positive for palpitations.       See hpi  Gastrointestinal:       See hpi   Genitourinary: Negative for dysuria and urgency.  Musculoskeletal: Negative for joint pain and myalgias.  Skin: Negative for itching and rash.  Neurological: Negative for dizziness, tingling and headaches.  Psychiatric/Behavioral: The patient is nervous/anxious and has insomnia.     Objective: Vitals:   06/27/16 1028  BP: (!) 100/59  Pulse: 76  Resp: 16  Temp: 98.4 F (36.9 C)  TempSrc: Oral  SpO2: 98%  Weight: 109 lb (49.4 kg)  Height: 5' 5.5" (1.664 m)    Physical Exam  Constitutional: She is oriented to person, place, and time. She appears cachectic.  HENT:  Head: Normocephalic and atraumatic.  Right Ear: External ear normal.  Left Ear: External ear normal.  Nose: Nose normal.  Mouth/Throat: Oropharynx is clear and moist.  Eyes: Conjunctivae and EOM are normal.  Cardiovascular: Normal rate, regular rhythm and normal heart  sounds.   Pulmonary/Chest: Effort normal and breath sounds normal. No respiratory distress. She has no wheezes.  Abdominal: Soft. Bowel sounds are normal. She exhibits no distension and no mass. There is no tenderness. There is no guarding.  Neurological: She is alert and oriented to person, place, and time.  Skin: Skin is warm. Capillary refill takes less than 2 seconds.     ECG NSR, no st elevation or twi   Assessment and Plan Kayla [name] was seen  today for nausea and tachycardia.  Diagnoses and all orders for this visit:  Nausea without vomiting- no concerning vitals signs or ECG changes, no evidence of dehydration Gave zofran for nausea Pt to discuss her fentanlyl dose with pain clinic Follow up with PCP already scheduled by pt for 4 days from now -     POCT glucose (manual entry) -     POCT CBC -     POCT urinalysis dipstick -     Comprehensive metabolic panel -     EKG 42-LTRV  Diarrhea, unspecified type- unclear etiology, could be anxiety or medications side effect So systemic sign of illness -     POCT CBC -     POCT urinalysis dipstick  H/O gastric bypass- discussed that her nausea could be a consequence of her surgery  Essential hypertension- bp in good range -     Comprehensive metabolic panel -     EKG 20-EBXI  Impaired glucose metabolism- normal glucose today Advised pt to take her carbohydrates with a protein source -     POCT glucose (manual entry)  Chronic, continuous use of opioids- nausea could be a consequence of opiates  Other orders -     ondansetron (ZOFRAN) 8 MG tablet; Take 1 tablet (8 mg total) by mouth every 8 (eight) hours as needed for nausea or vomiting.   A total of 40 minutes were spent face-to-face with the patient during this encounter and over half of that time was spent on counseling and coordination of care.   [place]

## 2016-07-01 ENCOUNTER — Ambulatory Visit (INDEPENDENT_AMBULATORY_CARE_PROVIDER_SITE_OTHER): Payer: Medicare Other | Admitting: Family Medicine

## 2016-07-01 ENCOUNTER — Encounter: Payer: Self-pay | Admitting: Family Medicine

## 2016-07-01 VITALS — BP 109/63 | HR 89 | Temp 98.3°F | Resp 18 | Ht 65.5 in | Wt 109.8 lb

## 2016-07-01 DIAGNOSIS — R233 Spontaneous ecchymoses: Secondary | ICD-10-CM

## 2016-07-01 DIAGNOSIS — R1084 Generalized abdominal pain: Secondary | ICD-10-CM

## 2016-07-01 DIAGNOSIS — D7389 Other diseases of spleen: Secondary | ICD-10-CM

## 2016-07-01 DIAGNOSIS — R238 Other skin changes: Secondary | ICD-10-CM

## 2016-07-01 DIAGNOSIS — R61 Generalized hyperhidrosis: Secondary | ICD-10-CM

## 2016-07-01 DIAGNOSIS — L659 Nonscarring hair loss, unspecified: Secondary | ICD-10-CM

## 2016-07-01 DIAGNOSIS — M810 Age-related osteoporosis without current pathological fracture: Secondary | ICD-10-CM

## 2016-07-01 DIAGNOSIS — R140 Abdominal distension (gaseous): Secondary | ICD-10-CM

## 2016-07-01 DIAGNOSIS — E538 Deficiency of other specified B group vitamins: Secondary | ICD-10-CM

## 2016-07-01 DIAGNOSIS — R7401 Elevation of levels of liver transaminase levels: Secondary | ICD-10-CM

## 2016-07-01 DIAGNOSIS — R740 Nonspecific elevation of levels of transaminase and lactic acid dehydrogenase [LDH]: Secondary | ICD-10-CM | POA: Diagnosis not present

## 2016-07-01 DIAGNOSIS — R5382 Chronic fatigue, unspecified: Secondary | ICD-10-CM

## 2016-07-01 MED ORDER — CYANOCOBALAMIN 1000 MCG/ML IJ SOLN
1000.0000 ug | INTRAMUSCULAR | Status: DC
  Administered 2016-07-01: 1000 ug via INTRAMUSCULAR

## 2016-07-01 NOTE — Patient Instructions (Signed)
     IF you received an x-ray today, you will receive an invoice from [facility]. Please contact [facility] at [phone] with questions or concerns regarding your invoice.   IF you received labwork today, you will receive an invoice from [facility]. Please contact [facility] at [phone] with questions or concerns regarding your invoice.   Our billing staff will not be able to assist you with questions regarding bills from these companies.  You will be contacted with the lab results as soon as they are available. The fastest way to get your results is to activate your My Chart account. Instructions are located on the last page of this paperwork. If you have not heard from us regarding the results in 2 weeks, please contact this office.     

## 2016-07-01 NOTE — Progress Notes (Signed)
Subjective:    Patient ID: Kayla [name], female    DOB: Jul 15, 1945, 71 y.o.   MRN: [id number] Chief Complaint  Patient presents with  . Hormonal problem    HPI 1. Cystocele with rectocele - had been referred to urology in [facility] after she had prob scheduling with [facility] scheduling but [facility] was sched 3 mo out so tried [facility] and was seen by Dr. Felipa Eth 2. New onset Diarrhea >8 mos, IBS - pt was supposed to follow-up with Dr. Cyndi Bender [facility] of the [place]. Prn bentyl uses about tid. She has a Roux En Y bypass to prevent Barrett's esophagus becoming cancer and saw him one of my partners several days ago for worsening nausea and abdominal distention over the past month. She has been having involuntary weight loss due to early satiety and nausea as well as significant bloating with carbohydrates. On Nexium 40 qd for GERD with prn liquid carafate also used frequently. She had to reschedule her GI appt.  When she eats her lower abdomen descends and gains weight. And now her upper abdomen is distending and cramping as well. Severe constipaiton and recurrent hemorrhoids. Chronic anal fissure so trying topical dilt gel rectally 3. Chronic interstitial cystitis with hematuria - last visit we increased her vesicare from 5 to 10mg  but this wasn't helping so we tried myrbetriq instead which didn't help. Azo did not help so we tried pt on uribel. Has to wear depends and a Kotex so going to get a bladder shock. Sees Dr. Garen Lah 4. Fibromyalgia/lumbar DDD: [facility]. She was weaning off fetanyl 50 mcg/hr patches since her prior pain management office with Dr. Vilinda Flake closed. Hydrocodone had caused chest pain. Voltaren gel helps. Also on cymbalta 60 qd, celebrex 200 qd, gabapentin 300 tid, and flexeril 10 tid. Also uses topical lidoderm patch daily. 5. Chronic migraine: Also with intermittent sharp HAs daily requiring fioricet every 6 hours to just dull  the HAs, HAs did not improve when she went off of the Fioricet for several months. MRI 2017. She was referred to Dr. Charlann Boxer at [place] for this by [facility] neurologist Dr. Tomi Likens. Neurologist Dr. Tomi Likens took her off of the fioricet. She was told to increase her gabapentin 300 tid if needed for headaches. Ok to take excedrin migraine for headaches.  Using when necessary Imitrex? 6. Cardiac: History of MVP/MR. History of hypertension. Complains of episodic Chest pain - was caused by hydrocodone. Also complaining of episodic tachycardia at rest. Follows with cardiology Dr. Acie Fredrickson on propranolol qid which pt takes prn (about 2x/d with CP). 7. Mood d/o With a history of anxiety, social isolation, agoraphobia, insomnia,and depression- klonopin 0.5-1mg  bid, cymbalta 60 (increased from 30 at her last visit), and weaned down on trazodone to 100mg . Following with therapist at [place] -Dr. Kyra Leyland - so she was able to go to the funerals again. Pt has panic Attacks and anxiety from working with her daughter who has anger management issues. When patient's daughter lives with her, patient's use of mood medication increases substantially. I rx'ed pt a 6 mo supply of klonopin March 2017 which was sev wks early so should not need another refill for about 3 months.  She uses the klonopin just for panic attacks but she gernally does have a pnic attack about ttwice a day though she keeps hoping her family will move out and she will need it a little less. Pt was asking for refill  at trazodone - was on 150mg  initially but she found no matter how low she goes on that that she still wakes up at 4 am - she wants to try trazodone 100mg  1/2 before bed and 1/2 at night and she wants to gradually wean off and  Try other sleep hygeine things. Reminded pt that klonopin is not as safe as trazodone.  8. Osteoporosis: DEXA scan 2007 was repeated 02/16/2016. We referred her to [facility] - appt is  next wk - to treat as 02/16/16 T score -3.3 and with her young age I think she could benefit from bone building treatments (reclast, prolia).  Also noted history of hyperparathyroidism. She states she stopped her fosamax at some point as she was trying to get off of that. She thinks that perhaps she restarted it several months ago by her pain physician.  9. History of diabetes mellitus. I suspect that this predated her gastric bypass. However there is no hemoglobin A1c on chart. Highest CBG was 108 in 2014. She does check her blood sugar at home and her sugar will vary between 40 and 560 and she has symptomatic episodes where she can't tell if she is to high and to low. That will happen 3-4x/mo but worked closely with a dietician and endocrine who are pushing her to eat more carbs and sugar to gain weight but this is exacerbating these sudden episodes of hyperglycemia. Her last hemoglobina1c 5.3 Lab Results  Component Value Date   HGBA1C 5.3 01/22/2016   10. Worried about vaginal dryness and dyspareunia. She had a total complete hysterectomy and was wondering about screening for vaginal cancer. However, she is unwilling to do any hormonal bloodwork.  She gets frequent vaginal tears as she has to put vaginal pressure and her fingernails tear the vagincal mucoa and the tissue keeps tearing.  11. She was told to get the macular degeneration and glaucoma was worsening. SEvere chornic dry eye, seen by Arnell Sieving PA at [place] 3d prior and told to get inflammatory levels.  Saw cancer geneticist   Sever sweats for the past 2 mos. Having nightsweats where she has to get up and shower and sheets. Skin very dry, hair falling out Cut trazodone down to 75mg  but still wakes up 2-3x/night with pain though then is able to go back to bed.  She had a total hysterectomy with BSO in her 100s and she quickly went off  Severe nausea Bloating  Terrible abdominal pain - does get RUQ abd pain - has had this  intermittently for eyars Unintentional weight loss She can see the distention with 5-6 mos pregnant.  She is due for colonoscopy an dupper endoscopy. She was having -scopies - trying to transfer back to Dr. Nicole Kindred as Started falling asleep driving home in the afternoon and [place] is to far to drive  For the last month she has had diarrhea after many decades of chronic constipation (dumpting syndrome about 5 years after roux-en-y done 1999 and cons since.) Can see ankles swell at night and can't feel 4 toes on each side until she touches them - then they feel tingling - pins and needles  Propranolol 10mg  for angina lowers her pulse and bp to much imitrex causes angina Saw cardiology about 6 mos ago and stable. Feels like food gets stuck in lower esophagus due to stenosis and diaphragmatic hernia. Eating a ton of salt - she was eating a lot of veggies abut was wondering if this was causing to much gas  MRI of the cervical spine as excrutiated IMPRESSION: Disc degeneration and facet degeneration is most prominent on the left at C4-5, C5-6, and C6-7 causing mild left foraminal narrowing at these levels.  Past Medical History:  Diagnosis Date  . Allergy   . Anemia    anemia in the past  . Angina    takes Propanolol prn and it stops her chest  . Anxiety   . Barrett esophagus    not consistently present  . Cataract   . Chronic kidney disease    kidney stones and UTI  . Complication of anesthesia    either  BP or pulse dropped with last surgery  . DDD (degenerative disc disease)    cervical and lumbar  . Dementia   . Depression    post traumatic stress  disorder  . Diabetes mellitus    sugar goes up and down diet controlled  . Dysrhythmia    brought on by stress  . Fatigue   . Fibromyalgia    neuropathy knees ankles and toes, bladder  . GERD (gastroesophageal reflux disease)   . H/O hyperparathyroidism   . Headache   . Headache(784.0)    migraines  . Hiatal hernia   .  History of kidney stones   . Hypertension    3 small brain aneurysms  . IBS (irritable bowel syndrome)   . Macular degeneration   . Migraines   . Osteoarthritis    all over  . Osteopetrosis   . Osteoporosis   . Peripheral vascular disease (HCC)    superficial phlebitis in left calf  . Personal history of colonic polyps 07/22/2013   07/2013 - 3 small adenomas - repeat colonoscopy 2018   Past Surgical History:  Procedure Laterality Date  . ABDOMINAL HYSTERECTOMY    . APPENDECTOMY    . CARDIAC CATHETERIZATION  03/22/1991   EF 61%  . CARDIOVASCULAR STRESS TEST  10/03/2006  . CHOLECYSTECTOMY    . COLONOSCOPY  06/23/2008   normal  . DILATION AND CURETTAGE OF UTERUS     x2  . ESOPHAGUS SURGERY    . EXPLORATORY LAPAROTOMY  1993  . GASTRIC BYPASS  1999  . GASTRIC RESTRICTION SURGERY  1991  . Right Arm Surgery    . Right Knee Arthroscopy    . Rt wrist fx  2009  . stomach stappeling  1991  . STONE EXTRACTION WITH BASKET  2012  . TONSILLECTOMY    . TUBAL LIGATION    . UPPER GASTROINTESTINAL ENDOSCOPY  06/23/2008   w/Dil, Barrett's esophagus  . US ECHOCARDIOGRAPHY  01/21/2007   EF 55-60%  . US ECHOCARDIOGRAPHY  08/30/2003   EF 55-60%   Current Outpatient Prescriptions on File Prior to Visit  Medication Sig Dispense Refill  . Ascorbic Acid (VITAMIN C) 1000 MG tablet Take 1,000 mg by mouth daily.    . beta carotene w/minerals (OCUVITE) tablet Take 1 tablet by mouth 2 (two) times daily.    Marland Kitchen CALCIUM-MAGNESIUM-ZINC PO Take by mouth daily.    . celecoxib (CELEBREX) 200 MG capsule Take 1 capsule (200 mg total) by mouth daily as needed. 90 capsule 1  . Cholecalciferol (VITAMIN D3) 2000 UNITS TABS Take 2,000 Units by mouth daily.    . clonazePAM (KLONOPIN) 1 MG tablet Take 0.5-1 tablets (0.5-1 mg total) by mouth 2 (two) times daily as needed. anxiety/panic attacks. May fill on or after March 2018. This is a 3 month supply 180 tablet 1  . Cyanocobalamin (VITAMIN B-12) 1000 MCG  SUBL Place 5,000  mcg under the tongue daily.     . cyclobenzaprine (FLEXERIL) 10 MG tablet Take 1 tablet (10 mg total) by mouth 3 (three) times daily as needed for muscle spasms. 270 tablet 1  . diclofenac sodium (VOLTAREN) 1 % GEL Apply 2 g topically 4 (four) times daily. 700 g 1  . dicyclomine (BENTYL) 10 MG capsule Take 1-2 tab 3 times daily AC as needed for spasms and cramping. 270 capsule 1  . diltiazem 2 % GEL Apply 1 application topically 2 (two) times daily. 30 g 0  . docusate sodium (COLACE) 250 MG capsule Take 250 mg by mouth daily.    . DULoxetine (CYMBALTA) 60 MG capsule Take 1 capsule (60 mg total) by mouth daily. 90 capsule 1  . esomeprazole (NEXIUM) 40 MG capsule Take 1 capsule (40 mg total) by mouth daily before breakfast. 90 capsule 1  . fentaNYL (DURAGESIC - DOSED MCG/HR) 12 MCG/HR Place 12.5 mcg onto the skin every 3 (three) days.    . fentaNYL (DURAGESIC - DOSED MCG/HR) 50 MCG/HR Place 50 mcg onto the skin every 3 (three) days.    Marland Kitchen gabapentin (NEURONTIN) 300 MG capsule Take 1 capsule (300 mg total) by mouth 3 (three) times daily. 270 capsule 1  . hypromellose (SYSTANE OVERNIGHT THERAPY) 0.3 % GEL ophthalmic ointment Place 1 drop into both eyes at bedtime. Reported on 02/11/2015    . lidocaine (LIDODERM) 5 % Place 1 patch onto the skin as needed. Remove & Discard patch within 12 hours. May dispense as 3 month supply 90 patch 3  . Lifitegrast (XIIDRA) 5 % SOLN Apply to eye.    . Loteprednol Etabonate (LOTEMAX) 0.5 % GEL Place 1 drop into both eyes 2 (two) times daily. Reported on 02/11/2015    . mirabegron ER (MYRBETRIQ) 25 MG TB24 tablet Take 1 tablet (25 mg total) by mouth daily. 90 tablet 1  . Naloxone HCl 0.4 MG/0.4ML SOAJ Administer 0.4mg   at first sign of opioid overdose and repeat every 2 minutes as needed for resuscitation. Call 911 immediately 5 Package 0  . Omega-3 Fatty Acids (SEA-OMEGA PO) Take 1,000 mg by mouth daily.    . ondansetron (ZOFRAN) 8 MG tablet Take 1 tablet (8 mg total)  by mouth every 8 (eight) hours as needed for nausea or vomiting. 20 tablet 0  . polyethylene glycol powder (GLYCOLAX/MIRALAX) powder Take 17 g by mouth 2 (two) times daily. Reported on 03/08/2015 850 g 1  . propranolol (INDERAL) 10 MG tablet Take 1 tablet (10 mg total) by mouth 4 (four) times daily. 360 tablet 1  . pyridoxine (B-6) 200 MG tablet Take 200 mg by mouth daily.    . sucralfate (CARAFATE) 1 GM/10ML suspension Take 10 mLs (1 g total) by mouth 4 (four) times daily -  with meals and at bedtime. 420 mL 3  . SUMAtriptan (IMITREX) 100 MG tablet Take 1 tablet at earliest onset of headache, may repeat in 2 hours if headache persists or reoccurs. 30 tablet 1  . traZODone (DESYREL) 100 MG tablet Take 0.5-1 tablets (50-100 mg total) by mouth at bedtime as needed for sleep. And 1/2 -1 if wakes at 4am. 90 tablet 1  . [DISCONTINUED] buPROPion (WELLBUTRIN) 75 MG tablet Take 75 mg by mouth daily after breakfast.      No current facility-administered medications on file prior to visit.    Allergies  Allergen Reactions  . Ambien [Zolpidem Tartrate]     Hallucinations  . Codeine  Anaphylaxis  . Oxycodone-Acetaminophen Shortness Of Breath  . Tylox [Oxycodone-Acetaminophen] Anaphylaxis    Chest pain  . Darvocet [Propoxyphene N-Acetaminophen]     Chest pain  . Darvon [Propoxyphene] Itching  . Food Swelling    Peanuts-wheat-  . Lorcet [Hydrocodone-Acetaminophen]     Chest pain  . Opium     hallucinations  . Vicodin [Hydrocodone-Acetaminophen] Other (See Comments)    Chest Pain   . Wheat Bran   . Zolpidem Tartrate Other (See Comments)    Makes her go crazy  . Zolpidem Tartrate     hallucinations  . Amoxicillin Nausea And Vomiting    Reaction:  Migraine headache  . Penicillins Nausea And Vomiting    Migraine Headaches   Family History  Problem Relation Age of Onset  . Stroke Mother   . Lung cancer Mother   . Heart disease Mother   . Diabetes Mother   . Hypertension Father   . Stroke  Father   . Lung cancer Father   . Heart disease Father   . Kidney disease Father   . Seizures Father        epilepsy, and sisters x 2  . Pancreatic cancer Sister   . Lung cancer Sister   . Colon cancer Maternal Aunt        12 relatives  . Uterine cancer Unknown        aunt  . Heart disease Unknown        grandmother  . Clotting disorder Sister   . Heart disease Sister        x 2  . Kidney disease Sister        x 2  . Breast cancer Sister   . Colon cancer Paternal Aunt   . Colon cancer Paternal Aunt    Social History   Social History  . Marital status: Married    Spouse name: Rushie Chestnut.  . Number of children: 2  . Years of education: N/A   Occupational History  . Retired   .  Retired   Social History Main Topics  . Smoking status: Never Smoker  . Smokeless tobacco: Never Used  . Alcohol use No  . Drug use: No  . Sexual activity: Not Currently   Other Topics Concern  . None   Social History Narrative  . None   Depression screen [facility] 2/9 07/01/2016 06/27/2016 02/26/2016 01/22/2016 11/24/2015  Decreased Interest 0 0 0 0 0  Down, Depressed, Hopeless 0 0 0 - 0  PHQ - 2 Score 0 0 0 0 0  Altered sleeping - - - - -  Tired, decreased energy - - - - -  Change in appetite - - - - -  Feeling bad or failure about yourself  - - - - -  Trouble concentrating - - - - -  Moving slowly or fidgety/restless - - - - -  Suicidal thoughts - - - - -  PHQ-9 Score - - - - -  Difficult doing work/chores - - - - -  Some recent data might be hidden    Review of Systems See hpi    Objective:   Physical Exam  Constitutional: She is oriented to person, place, and time. She appears well-developed and well-nourished. No distress.  HENT:  Head: Normocephalic and atraumatic.  Right Ear: External ear normal.  Left Ear: External ear normal.  Eyes: Conjunctivae are normal. No scleral icterus.  Neck: Normal range of motion. Neck supple. No thyromegaly present.  Cardiovascular: Normal rate,  regular rhythm, normal heart sounds and intact distal pulses.   Pulmonary/Chest: Effort normal and breath sounds normal. No respiratory distress.  Musculoskeletal: She exhibits no edema.  Lymphadenopathy:    She has no cervical adenopathy.  Neurological: She is alert and oriented to person, place, and time.  Skin: Skin is warm and dry. She is not diaphoretic. No erythema.  Psychiatric: She has a normal mood and affect. Her behavior is normal.          Assessment & Plan:  Did she get Zostavax - did not get. Over 40 min spent in face-to-face evaluation of and consultation with patient and coordination of care.  Over 50% of this time was spent counseling this patient.  1. Diaphoresis   2. Generalized abdominal pain   3. Abdominal distension (gaseous)   4. Calcification of spleen   5. Hair loss   6. Night sweats   7. Transaminitis   8. Chronic fatigue   9. Osteoporosis without current pathological fracture, unspecified osteoporosis type   10. Vitamin B12 deficiency   11. Easy bruising     Orders Placed This Encounter  Procedures  . Thyroid Panel With TSH  . Comprehensive metabolic panel  . Gamma GT  . Lipase  . Sedimentation rate  . C-reactive protein  . Lactate Dehydrogenase  . H. pylori breath test  . Vitamin B12  . CBC with Differential/Platelet  . Pathologist smear review  . Protime-INR  . APTT  . Ferritin  . Pathologist smear review  . Specimen status report    Meds ordered this encounter  Medications  . cyanocobalamin ((VITAMIN B-12)) injection 1,000 mcg    [name], M.D.  Primary Care at [facility] [address] [name],  [address] [phone] phone [phone] fax  07/03/16 10:21 PM

## 2016-07-02 ENCOUNTER — Telehealth: Payer: Self-pay | Admitting: Cardiovascular Disease

## 2016-07-02 LAB — THYROID PANEL WITH TSH
Free Thyroxine Index: 1.5 (ref 1.2–4.9)
T3 Uptake Ratio: 26 % (ref 24–39)
T4 TOTAL: 5.7 ug/dL (ref 4.5–12.0)
TSH: 2.43 u[IU]/mL (ref 0.450–4.500)

## 2016-07-02 LAB — CBC WITH DIFFERENTIAL/PLATELET
BASOS ABS: 0 10*3/uL (ref 0.0–0.2)
Basos: 0 %
EOS (ABSOLUTE): 0.2 10*3/uL (ref 0.0–0.4)
EOS: 3 %
HEMATOCRIT: 40.3 % (ref 34.0–46.6)
HEMOGLOBIN: 13.4 g/dL (ref 11.1–15.9)
IMMATURE GRANULOCYTES: 0 %
Immature Grans (Abs): 0 10*3/uL (ref 0.0–0.1)
LYMPHS ABS: 3.1 10*3/uL (ref 0.7–3.1)
LYMPHS: 42 %
MCH: 29.5 pg (ref 26.6–33.0)
MCHC: 33.3 g/dL (ref 31.5–35.7)
MCV: 89 fL (ref 79–97)
MONOCYTES: 7 %
Monocytes Absolute: 0.5 10*3/uL (ref 0.1–0.9)
NEUTROS PCT: 48 %
Neutrophils Absolute: 3.6 10*3/uL (ref 1.4–7.0)
Platelets: 262 10*3/uL (ref 150–379)
RBC: 4.54 x10E6/uL (ref 3.77–5.28)
RDW: 14.1 % (ref 12.3–15.4)
WBC: 7.4 10*3/uL (ref 3.4–10.8)

## 2016-07-02 LAB — COMPREHENSIVE METABOLIC PANEL
A/G RATIO: 1.8 (ref 1.2–2.2)
ALBUMIN: 4.6 g/dL (ref 3.6–4.8)
ALK PHOS: 123 IU/L — AB (ref 39–117)
ALT: 30 IU/L (ref 0–32)
AST: 33 IU/L (ref 0–40)
BILIRUBIN TOTAL: 0.4 mg/dL (ref 0.0–1.2)
BUN / CREAT RATIO: 33 — AB (ref 12–28)
BUN: 22 mg/dL (ref 8–27)
CHLORIDE: 100 mmol/L (ref 96–106)
CO2: 25 mmol/L (ref 20–29)
Calcium: 9.7 mg/dL (ref 8.7–10.3)
Creatinine, Ser: 0.67 mg/dL (ref 0.57–1.00)
GFR calc non Af Amer: 91 mL/min/{1.73_m2} (ref >59)
GFR, EST AFRICAN AMERICAN: 104 mL/min/{1.73_m2} (ref >59)
GLOBULIN, TOTAL: 2.5 g/dL (ref 1.5–4.5)
GLUCOSE: 93 mg/dL (ref 65–99)
POTASSIUM: 4.4 mmol/L (ref 3.5–5.2)
SODIUM: 140 mmol/L (ref 134–144)
TOTAL PROTEIN: 7.1 g/dL (ref 6.0–8.5)

## 2016-07-02 LAB — C-REACTIVE PROTEIN: CRP: <0.3 mg/L (ref 0.0–4.9)

## 2016-07-02 LAB — PATHOLOGIST SMEAR REVIEW

## 2016-07-02 LAB — GAMMA GT: GGT: 18 IU/L (ref 0–60)

## 2016-07-02 LAB — PROTIME-INR
INR: 1 (ref 0.8–1.2)
Prothrombin Time: 10.3 s (ref 9.1–12.0)

## 2016-07-02 LAB — VITAMIN B12

## 2016-07-02 LAB — APTT: aPTT: 26 s (ref 24–33)

## 2016-07-02 LAB — FERRITIN: FERRITIN: 21 ng/mL (ref 15–150)

## 2016-07-02 LAB — LIPASE: Lipase: 34 U/L (ref 14–72)

## 2016-07-02 LAB — H. PYLORI BREATH TEST: H pylori Breath Test: NEGATIVE

## 2016-07-02 LAB — SEDIMENTATION RATE: Sed Rate: 2 mm/hr (ref 0–40)

## 2016-07-02 LAB — LACTATE DEHYDROGENASE: LDH: 227 IU/L — ABNORMAL HIGH (ref 119–226)

## 2016-07-04 DIAGNOSIS — M797 Fibromyalgia: Secondary | ICD-10-CM | POA: Diagnosis not present

## 2016-07-04 DIAGNOSIS — M4692 Unspecified inflammatory spondylopathy, cervical region: Secondary | ICD-10-CM | POA: Diagnosis not present

## 2016-07-04 DIAGNOSIS — G894 Chronic pain syndrome: Secondary | ICD-10-CM | POA: Diagnosis not present

## 2016-07-04 DIAGNOSIS — R51 Headache: Secondary | ICD-10-CM | POA: Diagnosis not present

## 2016-07-09 ENCOUNTER — Ambulatory Visit (INDEPENDENT_AMBULATORY_CARE_PROVIDER_SITE_OTHER): Payer: Medicare Other | Admitting: Psychiatry

## 2016-07-09 DIAGNOSIS — F331 Major depressive disorder, recurrent, moderate: Secondary | ICD-10-CM

## 2016-07-09 NOTE — Progress Notes (Signed)
Office Visit Note  Patient: Kayla [name]             Date of Birth: August 16, 1945           MRN: [id number]             PCP: Shawnee Knapp, MD Referring: Shawnee Knapp, MD Visit Date: 07/12/2016 Occupation: '@GUAROCC' @    Subjective: Osteoporosis and joint pain   History of Present Illness: Kayla [name] is a 71 y.o. female seen in consultation per request of her PCP for evaluation of osteoporosis and treatment. According to patient she was diagnosed with osteoporosis in her 26s. She had hysterectomy when she was 71 years old. She states she was offered hormonal therapy which she declined. She has had 3 DEXA as scans since then. According to her she's been on Fosamax for last 5 years. She has had DEXA scans on different machines so comparison is not available. She also gives history of fractures and different part of her body including right ankle joint, right knee joint, right wrist joint, refracture all related to falls and injuries. She was also diagnosed with osteoporosis in her 30s per patient. She states all her joints are painful she has lot discomfort in her C-spine. She has had nerve block, ablation without much results. Now she is seeing chiropractor and physical therapist. She's concerned that she might be developing rheumatoid arthritis which she's been having some discomfort in her right first and second MCP joint.  Activities of Daily Living:  Patient reports morning stiffness for 10 minutes.   Patient Reports nocturnal pain.  Difficulty dressing/grooming: Denies Difficulty climbing stairs: Reports Difficulty getting out of chair: Reports Difficulty using hands for taps, buttons, cutlery, and/or writing: Reports   Review of Systems  Constitutional: Positive for fatigue. Negative for night sweats, weight gain, weight loss and weakness.  HENT: Positive for mouth dryness. Negative for mouth sores, trouble swallowing, trouble swallowing and nose dryness.   Eyes: Positive for  dryness. Negative for pain, redness and visual disturbance.  Respiratory: Negative for cough, shortness of breath and difficulty breathing.   Cardiovascular: Positive for palpitations. Negative for chest pain, hypertension, irregular heartbeat and swelling in legs/feet.  Gastrointestinal: Positive for constipation and diarrhea. Negative for blood in stool.       History of IBS  Endocrine: Negative for increased urination.  Genitourinary: Negative for vaginal dryness.  Musculoskeletal: Positive for arthralgias, joint pain, myalgias, morning stiffness and myalgias. Negative for joint swelling, muscle weakness and muscle tenderness.  Skin: Positive for hair loss. Negative for color change, rash, skin tightness, ulcers and sensitivity to sunlight.  Allergic/Immunologic: Negative for susceptible to infections.  Neurological: Negative for dizziness, memory loss and night sweats.  Hematological: Negative for swollen glands.  Psychiatric/Behavioral: Positive for sleep disturbance. Negative for depressed mood. The patient is nervous/anxious.     PMFS History:  Patient Active Problem List   Diagnosis Date Noted  . Peripheral vascular disease ([place]) 01/22/2016  . Hypertension 01/22/2016  . Prediabetes 01/22/2016  . H/O hyperparathyroidism 01/22/2016  . Chest pain 12/11/2015  . Mitral regurgitation 12/11/2015  . Vitamin D deficiency 03/08/2015  . H/O gastric bypass 02/11/2014  . Chronic maxillary sinusitis 09/15/2013  . Personal history of colonic polyps 07/22/2013  . HA (headache)-chronic/tension type 07/28/2012  . Osteoporosis 07/28/2012  . Unspecified vitamin D deficiency-2009 07/28/2012  . Nephrolithiasis 07/28/2012  . Fibromyalgia 07/26/2012  . Chronic interstitial cystitis 10/16/2011  . Chronic constipation 10/04/2010  . WEIGHT LOSS  11/27/2009  . PERSONAL HISTORY OF FAILED MODERATE SEDATION 06/01/2008  . Anxiety state 03/18/2007  . Reactive depression 03/18/2007  . GERD 03/18/2007  .  Diaphragmatic hernia 03/18/2007  . Osteoarthritis 03/18/2007  . [place] DISEASE, CERVICAL 03/18/2007  . [place] DISEASE, LUMBAR 03/18/2007  . Myalgia and myositis 03/18/2007    Past Medical History:  Diagnosis Date  . Allergy   . Anemia    anemia in the past  . Angina    takes Propanolol prn and it stops her chest  . Anxiety   . Barrett esophagus    not consistently present  . Cataract   . Chronic kidney disease    kidney stones and UTI  . Complication of anesthesia    either  BP or pulse dropped with last surgery  . DDD (degenerative disc disease)    cervical and lumbar  . Dementia   . Depression    post traumatic stress  disorder  . Diabetes mellitus    sugar goes up and down diet controlled  . Dysrhythmia    brought on by stress  . Fatigue   . Fibromyalgia    neuropathy knees ankles and toes, bladder  . GERD (gastroesophageal reflux disease)   . H/O hyperparathyroidism   . Headache   . Headache(784.0)    migraines  . Hiatal hernia   . History of kidney stones   . Hypertension    3 small brain aneurysms  . IBS (irritable bowel syndrome)   . Macular degeneration   . Migraines   . Osteoarthritis    all over  . Osteopetrosis   . Osteoporosis   . Peripheral vascular disease ([facility])    superficial phlebitis in left calf  . Personal history of colonic polyps 07/22/2013   07/2013 - 3 small adenomas - repeat colonoscopy 2018    Family History  Problem Relation Age of Onset  . Stroke Mother   . Lung cancer Mother   . Heart disease Mother   . Diabetes Mother   . Hypertension Father   . Stroke Father   . Lung cancer Father   . Heart disease Father   . Kidney disease Father   . Seizures Father        epilepsy, and sisters x 2  . Pancreatic cancer Sister   . Lung cancer Sister   . Colon cancer Maternal Aunt        12 relatives  . Uterine cancer Unknown        aunt  . Heart disease Unknown        grandmother  . Clotting disorder Sister   . Heart disease Sister         x 2  . Kidney disease Sister        x 2  . Breast cancer Sister   . Colon cancer Paternal Aunt   . Colon cancer Paternal Aunt    Past Surgical History:  Procedure Laterality Date  . ABDOMINAL HYSTERECTOMY    . APPENDECTOMY    . CARDIAC CATHETERIZATION  03/22/1991   EF 61%  . CARDIOVASCULAR STRESS TEST  10/03/2006  . CHOLECYSTECTOMY    . COLONOSCOPY  06/23/2008   normal  . DILATION AND CURETTAGE OF UTERUS     x2  . ESOPHAGUS SURGERY    . EXPLORATORY LAPAROTOMY  1993  . GASTRIC BYPASS  1999  . GASTRIC RESTRICTION SURGERY  1991  . Right Arm Surgery    . Right Knee Arthroscopy    .  Rt wrist fx  2009  . stomach stappeling  1991  . STONE EXTRACTION WITH BASKET  2012  . TONSILLECTOMY    . TUBAL LIGATION    . UPPER GASTROINTESTINAL ENDOSCOPY  06/23/2008   w/Dil, Barrett's esophagus  . US ECHOCARDIOGRAPHY  01/21/2007   EF 55-60%  . US ECHOCARDIOGRAPHY  08/30/2003   EF 55-60%   Social History   Social History Narrative  . No narrative on file     Objective: Vital Signs: BP 90/60   Pulse 62   Resp 14   Ht '5\' 5"'  (1.651 m)   Wt 110 lb (49.9 kg)   BMI 18.30 kg/m    Physical Exam  Constitutional: She is oriented to person, place, and time. She appears well-developed and well-nourished.  HENT:  Head: Normocephalic and atraumatic.  Eyes: Conjunctivae and EOM are normal.  Neck: Normal range of motion.  Cardiovascular: Normal rate, regular rhythm, normal heart sounds and intact distal pulses.   Pulmonary/Chest: Effort normal and breath sounds normal.  Abdominal: Soft. Bowel sounds are normal.  Lymphadenopathy:    She has no cervical adenopathy.  Neurological: She is alert and oriented to person, place, and time.  Skin: Skin is warm and dry. Capillary refill takes less than 2 seconds.  Psychiatric: She has a normal mood and affect. Her behavior is normal.  Nursing note and vitals reviewed.    Musculoskeletal Exam: She has limited range of motion of her C-spine with  discomfort on left lateral rotation. Thoracic and lumbar spine good range of motion. Shoulder joints elbow joints wrist joints are good range of motion. She has no MCP PIP/DIP thickening or synovitis. Hip joints knee joints ankles MTPs PIPs DIPs were good range of motion with no synovitis.  CDAI Exam: No CDAI exam completed.    Investigation: Findings:  02/16/16 The BMD measured at Femur Neck Left is 0.585 g/cm2 with a T-score of -3.3.  07/01/2016 CBC normal, CMP normal, TSH normal, ESR 2 01/22/2016 UA negative, vitamin D 34.4, ANA negative  Imaging: No results found.  Speciality Comments: No specialty comments available.    Procedures:  No procedures performed Allergies: Ambien [zolpidem tartrate]; Codeine; Oxycodone-acetaminophen; Tylox [oxycodone-acetaminophen]; Darvocet [propoxyphene n-acetaminophen]; Darvon [propoxyphene]; Food; Lorcet [hydrocodone-acetaminophen]; Opium; Vicodin [hydrocodone-acetaminophen]; Wheat bran; Zolpidem tartrate; Zolpidem tartrate; Amoxicillin; and Penicillins   Assessment / Plan:     Visit Diagnoses: Age-related osteoporosis without current pathological fracture - T -3.3 left femoral neck. We had detailed discussion regarding osteoporosis. She believes she's been on Fosamax for the last 5-10 years. Her most recent bone density does not have any comparison study. It is difficult to assess if there was any improvement in her bone mass density. Different treatment options and their side effects were discussed at length. I believe she will do better with Forteo or Tymlos . Indications side effects contraindications were discussed at length. We will apply for the medication.  Vitamin D deficiency: Need for taking vitamin D supplement on regular basis was discussed.  H/O gastric bypass: Patient states history of chronic weight loss due to that.  DISC DISEASE, CERVICAL: She is chronic discomfort in her C-spine.  DISC DISEASE, LUMBAR: Chronic pain  Pain hands:  I did not notice any synovitis on examination today. I do not think that autoimmune workup is necessary.  H/O hyperparathyroidism: I'm uncertain if it is primary or secondary to vitamin D deficiency.  WEIGHT LOSS: Secondary to gastric bypass  Her other medical problems are listed as follows:  Nephrolithiasis  Chronic interstitial cystitis  Fibromyalgia  Anxiety state  Essential hypertension  Prediabetes  Peripheral vascular disease ([place])    Orders: Orders Placed This Encounter  Procedures  . Serum protein electrophoresis with reflex  . VITAMIN D 25 Hydroxy (Vit-D Deficiency, Fractures)  . PTH, Intact and Calcium  . Magnesium  . Tissue Transglutaminase Abs,IgG,IgA  . Gliadin Antibodies, Serum   No orders of the defined types were placed in this encounter.   Face-to-face time spent with patient was 50 minutes. 50% of time was spent in counseling and coordination of care.  Follow-Up Instructions: Return in about 2 months (around 09/11/2016) for Osteoporosis, Osteoarthritis.   Bo Merino, MD  Note - This record has been created using Editor, commissioning.  Chart creation errors have been sought, but may not always  have been located. Such creation errors do not reflect on  the standard of medical care.

## 2016-07-11 LAB — PATHOLOGIST SMEAR REVIEW
Basophils Absolute: 0 10*3/uL (ref 0.0–0.2)
Basos: 0 %
EOS (ABSOLUTE): 0.2 10*3/uL (ref 0.0–0.4)
Eos: 3 %
HEMOGLOBIN: 13.3 g/dL (ref 11.1–15.9)
Hematocrit: 41.6 % (ref 34.0–46.6)
IMMATURE GRANS (ABS): 0 10*3/uL (ref 0.0–0.1)
IMMATURE GRANULOCYTES: 0 %
LYMPHS: 42 %
Lymphocytes Absolute: 3.2 10*3/uL — ABNORMAL HIGH (ref 0.7–3.1)
MCH: 29.9 pg (ref 26.6–33.0)
MCHC: 32 g/dL (ref 31.5–35.7)
MCV: 94 fL (ref 79–97)
MONOCYTES: 7 %
MONOS ABS: 0.5 10*3/uL (ref 0.1–0.9)
NEUTROS PCT: 48 %
Neutrophils Absolute: 3.6 10*3/uL (ref 1.4–7.0)
Path Rev PLTs: NORMAL
Path Rev RBC: NORMAL
Platelets: 265 10*3/uL (ref 150–379)
RBC: 4.45 x10E6/uL (ref 3.77–5.28)
RDW: 14.2 % (ref 12.3–15.4)
WBC: 7.6 10*3/uL (ref 3.4–10.8)

## 2016-07-11 LAB — SPECIMEN STATUS REPORT

## 2016-07-12 ENCOUNTER — Ambulatory Visit (INDEPENDENT_AMBULATORY_CARE_PROVIDER_SITE_OTHER): Payer: Medicare Other | Admitting: Rheumatology

## 2016-07-12 ENCOUNTER — Encounter: Payer: Self-pay | Admitting: Rheumatology

## 2016-07-12 VITALS — BP 90/60 | HR 62 | Resp 14 | Ht 65.0 in | Wt 110.0 lb

## 2016-07-12 DIAGNOSIS — E559 Vitamin D deficiency, unspecified: Secondary | ICD-10-CM

## 2016-07-12 DIAGNOSIS — M5030 Other cervical disc degeneration, unspecified cervical region: Secondary | ICD-10-CM | POA: Diagnosis not present

## 2016-07-12 DIAGNOSIS — M5137 Other intervertebral disc degeneration, lumbosacral region: Secondary | ICD-10-CM | POA: Diagnosis not present

## 2016-07-12 DIAGNOSIS — M810 Age-related osteoporosis without current pathological fracture: Secondary | ICD-10-CM | POA: Diagnosis not present

## 2016-07-12 DIAGNOSIS — M51379 Other intervertebral disc degeneration, lumbosacral region without mention of lumbar back pain or lower extremity pain: Secondary | ICD-10-CM

## 2016-07-12 DIAGNOSIS — H04123 Dry eye syndrome of bilateral lacrimal glands: Secondary | ICD-10-CM | POA: Diagnosis not present

## 2016-07-12 DIAGNOSIS — M797 Fibromyalgia: Secondary | ICD-10-CM

## 2016-07-12 DIAGNOSIS — I10 Essential (primary) hypertension: Secondary | ICD-10-CM

## 2016-07-12 DIAGNOSIS — R7303 Prediabetes: Secondary | ICD-10-CM

## 2016-07-12 DIAGNOSIS — F411 Generalized anxiety disorder: Secondary | ICD-10-CM | POA: Diagnosis not present

## 2016-07-12 DIAGNOSIS — H02052 Trichiasis without entropian right lower eyelid: Secondary | ICD-10-CM | POA: Diagnosis not present

## 2016-07-12 DIAGNOSIS — R634 Abnormal weight loss: Secondary | ICD-10-CM | POA: Diagnosis not present

## 2016-07-12 DIAGNOSIS — N3010 Interstitial cystitis (chronic) without hematuria: Secondary | ICD-10-CM

## 2016-07-12 DIAGNOSIS — Z9884 Bariatric surgery status: Secondary | ICD-10-CM | POA: Diagnosis not present

## 2016-07-12 DIAGNOSIS — N200 Calculus of kidney: Secondary | ICD-10-CM | POA: Diagnosis not present

## 2016-07-12 DIAGNOSIS — H01001 Unspecified blepharitis right upper eyelid: Secondary | ICD-10-CM | POA: Diagnosis not present

## 2016-07-12 DIAGNOSIS — D2311 Other benign neoplasm of skin of right eyelid, including canthus: Secondary | ICD-10-CM | POA: Diagnosis not present

## 2016-07-12 DIAGNOSIS — Z8639 Personal history of other endocrine, nutritional and metabolic disease: Secondary | ICD-10-CM

## 2016-07-12 DIAGNOSIS — I739 Peripheral vascular disease, unspecified: Secondary | ICD-10-CM

## 2016-07-12 DIAGNOSIS — H01004 Unspecified blepharitis left upper eyelid: Secondary | ICD-10-CM | POA: Diagnosis not present

## 2016-07-12 NOTE — Progress Notes (Signed)
Pharmacy Note  Subjective: Patient presents today to the [facility] to see Dr. [name].  Patient is currently taking alendronate.  I was asked to see the patient regarding initiation of parathyroid hormone analog.  Patient's insurance was reviewed.  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  I called her prescription insurance plan and spoke to [employer].  She ran a test claim for Tymlos and reports it is covered with no prior authorization.  Copay is $8.10.  Patient seen by the pharmacist for counseling on Tymlos.    Objective: CMP     Component Value Date/Time   NA 140 07/01/2016 1711   K 4.4 07/01/2016 1711   CL 100 07/01/2016 1711   CO2 25 07/01/2016 1711   GLUCOSE 93 07/01/2016 1711   GLUCOSE 97 03/08/2015 1328   BUN 22 07/01/2016 1711   CREATININE 0.67 07/01/2016 1711   CREATININE 0.61 03/08/2015 1328   CALCIUM 9.7 07/01/2016 1711   PROT 7.1 07/01/2016 1711   ALBUMIN 4.6 07/01/2016 1711   AST 33 07/01/2016 1711   ALT 30 07/01/2016 1711   ALKPHOS 123 (H) 07/01/2016 1711   BILITOT 0.4 07/01/2016 1711   GFRNONAA 91 07/01/2016 1711   GFRAA 104 07/01/2016 1711   Assessment/Plan: Had detailed discussion of osteoporosis and goals of therapy.  Counseled patient on the purpose, proper use, and adverse effects of Tymlos.  Discussed maximum use of parathyroid hormone analog of two years and then patient would need to start another osteoporosis medication.  Patient voiced understanding.  Patient denies any questions or concerns at this time.  Will plan to start Tymlos after lab results return.  Patient reports [facility] takes about a month to process prescriptoins.  Will try to get sample of Tymlos to start patient on while [facility] processes the prescription.    Elisabeth Most, Pharm.D., BCPS, CPP Clinical Pharmacist Pager: [phone] Phone: [phone] 07/12/2016 10:45 AM

## 2016-07-12 NOTE — Patient Instructions (Signed)
Abaloparatide injection What is this medicine? ABALOPARATIDE (a ball oh PAR a tide) increases bone mass and strength. It helps make healthy bone and to slow bone loss. This medicine is used to prevent bone fracture. This medicine may be used for other purposes; ask your health care provider or pharmacist if you have questions. COMMON BRAND NAME(S): TYMLOS What should I tell my health care provider before I take this medicine? They need to know if you have any of these conditions: -bone disease other than osteoporosis -high levels of calcium in the blood -high levels of an enzyme called alkaline phosphatase in the blood -history of cancer in the bone -kidney stone -Paget's disease -parathyroid disease -receiving radiation therapy -an unusual or allergic reaction to abaloparatide, other medicines, foods, dyes, or preservatives -pregnant or trying to get pregnant -breast-feeding How should I use this medicine? This medicine is for injection under the skin. You will be taught how to prepare and give this medicine. Use exactly as directed. Take your medicine at regular intervals. Do not take your medicine more often than directed. It is important that you put your used needles and pens in a special sharps container. Do not put them in a trash can. If you do not have a sharps container, call your pharmacist or health care provider to get one. A special MedGuide will be given to you by the pharmacist with each prescription and refill. Be sure to read this information carefully each time. Talk to your pediatrician regarding the use of this medicine in children. Special care may be needed. Overdosage: If you think you have taken too much of this medicine contact a poison control center or emergency room at once. NOTE: This medicine is only for you. Do not share this medicine with others. What if I miss a dose? If you miss a dose, take it as soon as you can. If it is almost time for your next dose,  take only that dose. Do not take double or extra doses. What may interact with this medicine? Interactions have not been studied. This list may not describe all possible interactions. Give your health care provider a list of all the medicines, herbs, non-prescription drugs, or dietary supplements you use. Also tell them if you smoke, drink alcohol, or use illegal drugs. Some items may interact with your medicine. What should I watch for while using this medicine? Visit your doctor or health care professional for regular checks on your progress. You may need blood work done while you are taking this medicine. You may get dizzy. Do not drive, use machinery, or do anything that needs mental alertness until you know how this medicine affects you. Do not stand or sit up quickly, especially if you are an older patient. This reduces the risk of dizzy or fainting spells. Avoid alcoholic drinks; they can make you more dizzy. You should make sure that you get enough calcium and vitamin D while you are taking this medicine. Discuss the foods you eat and the vitamins you take with your health care professional. Talk to your doctor about your risk of cancer. You may be more at risk for certain types of cancers if you take this medicine. What side effects may I notice from receiving this medicine? Side effects that you should report to your doctor or health care professional as soon as possible: -allergic reactions like skin rash, itching or hives, swelling of the face, lips, or tongue -blood in the urine; pain in the lower back  or side; pain when urinating -signs and symptoms of low blood pressure like dizziness; feeling faint or lightheaded, falls; unusually weak or tired -signs and symptoms of increased calcium like nausea; vomiting; constipation; low energy; or muscle weakness Side effects that usually do not require medical attention (report these to your doctor or health care professional if they continue or  are bothersome): -headache -fast, irregular heartbeat -nausea -pain, redness, irritation or swelling at the injection site -stomach upset or pain -tiredness This list may not describe all possible side effects. Call your doctor for medical advice about side effects. You may report side effects to FDA at 1-800-FDA-1088. Where should I keep my medicine? Keep out of the reach of children. Store unopened pens in the refrigerator between 2 and 8 degrees C (36 and 46 degrees F). Do not freeze. After first use, store your pen for up to 30 days at room temperature between 20 and 25 degrees C (68 and 77 degrees F). Avoid exposure to heat. Throw away any unused medicine after the expiration date on the label. NOTE: This sheet is a summary. It may not cover all possible information. If you have questions about this medicine, talk to your doctor, pharmacist, or health care provider.  2018 Elsevier/Gold Standard (2015-05-22 10:03:05)

## 2016-07-13 LAB — VITAMIN D 25 HYDROXY (VIT D DEFICIENCY, FRACTURES): Vit D, 25-Hydroxy: 23 ng/mL — ABNORMAL LOW (ref 30–100)

## 2016-07-13 LAB — MAGNESIUM: MAGNESIUM: 2.1 mg/dL (ref 1.5–2.5)

## 2016-07-15 LAB — PTH, INTACT AND CALCIUM
CALCIUM: 9.3 mg/dL (ref 8.6–10.4)
PTH: 83 pg/mL — ABNORMAL HIGH (ref 14–64)

## 2016-07-16 LAB — PROTEIN ELECTROPHORESIS, SERUM, WITH REFLEX
ALBUMIN ELP: 3.9 g/dL (ref 3.8–4.8)
ALPHA-1-GLOBULIN: 0.2 g/dL (ref 0.2–0.3)
ALPHA-2-GLOBULIN: 0.5 g/dL (ref 0.5–0.9)
BETA GLOBULIN: 0.4 g/dL (ref 0.4–0.6)
Beta 2: 0.2 g/dL (ref 0.2–0.5)
Gamma Globulin: 0.9 g/dL (ref 0.8–1.7)
Total Protein, Serum Electrophoresis: 6.1 g/dL (ref 6.1–8.1)

## 2016-07-16 LAB — GLIADIN ANTIBODIES, SERUM
GLIADIN IGG: 3 U (ref <20)
Gliadin IgA: 4 Units (ref <20)

## 2016-07-16 LAB — TISSUE TRANSGLUTAMINASE ABS,IGG,IGA
Tissue Transglut Ab: 3 U/mL (ref <6)
Tissue Transglutaminase Ab, IgA: 1 U/mL (ref <4)

## 2016-07-16 NOTE — Progress Notes (Signed)
Call and vitamin D 50,000 units once a week total 90 days repeat vitamin D level in 3 months. PTH is high because of low vitamin D

## 2016-07-19 ENCOUNTER — Ambulatory Visit (INDEPENDENT_AMBULATORY_CARE_PROVIDER_SITE_OTHER): Payer: Medicare Other | Admitting: Family Medicine

## 2016-07-19 ENCOUNTER — Telehealth: Payer: Self-pay | Admitting: *Deleted

## 2016-07-19 ENCOUNTER — Encounter: Payer: Self-pay | Admitting: Family Medicine

## 2016-07-19 VITALS — BP 99/64 | HR 86 | Temp 98.3°F | Resp 18 | Ht 65.0 in | Wt 111.6 lb

## 2016-07-19 DIAGNOSIS — E559 Vitamin D deficiency, unspecified: Secondary | ICD-10-CM

## 2016-07-19 DIAGNOSIS — F5081 Binge eating disorder: Secondary | ICD-10-CM | POA: Diagnosis not present

## 2016-07-19 DIAGNOSIS — E611 Iron deficiency: Secondary | ICD-10-CM | POA: Diagnosis not present

## 2016-07-19 DIAGNOSIS — I950 Idiopathic hypotension: Secondary | ICD-10-CM

## 2016-07-19 NOTE — Patient Instructions (Addendum)
IF you received an x-ray today, you will receive an invoice from [facility]. Please contact [facility] at [phone] with questions or concerns regarding your invoice.   IF you received labwork today, you will receive an invoice from [place] [facility]. Please contact [facility] at [phone] with questions or concerns regarding your invoice.   Our billing staff will not be able to assist you with questions regarding bills from these companies.  You will be contacted with the lab results as soon as they are available. The fastest way to get your results is to activate your My Chart account. Instructions are located on the last page of this paperwork. If you have not heard from [place] regarding the results in 2 weeks, please contact this office.     Binge-Eating Disorder Binge-eating disorder is an eating disorder that involves repeated episodes of binge eating. Binge eating refers to eating a larger-than-normal amount of food in a short period of time. People with binge-eating disorder feel unable to control their eating. Although they feel bad about overeating, they usually do not try to make up for overeating. They usually do not make themselves vomit, use laxatives, starve themselves, or exercise too much. Binge-eating disorder usually starts in the teenage years or early 62s. It often gets worse with stress. What are the causes? The cause is unknown. What increases the risk? Risk factors include:  Being a teenager or in your early 77s.  Being female. Binge-eating disorder can affect males, but it is more common in females.  Being overweight or obese.  Having a mental health disorder, such as depression or anxiety.  Having a substance use disorder, such as alcohol use disorder.  What are the signs or symptoms? Symptoms may include:  Eating much more quickly than normal.  Eating to the point of feeling physically uncomfortable.  Eating large amounts of food when  you are not hungry.  Eating alone because you are embarrassed by how much you are eating.  Feeling disgusted, depressed, or guilty after overeating.  How is this diagnosed? The diagnosis of binge-eating disorder is made through an assessment by your health care provider. You may be diagnosed with the disorder if you:  Binge eat at least once a week on average for three months or longer.  Have three or more of the symptoms of the disorder.  Once you have been diagnosed, your level of binge-eating disorder will be rated from mild to severe. The rating is based on how often you binge eat. How is this treated? Treatment is usually provided by mental health professionals, such as psychologists, psychiatrists, licensed professional counselors, and clinical social workers. The following treatment options are available:  Cognitive behavioral therapy. This is a form of talk therapy that helps you recognize the thoughts, beliefs, and emotions that contribute to overeating and helps you change them.  Interpersonal psychotherapy. This is a form of talk therapy that focuses on fixing relationship problems that trigger binge-eating episodes.  Dialectical behavioral therapy. This is a form of talk therapy that helps you learn skills to control your emotions and tolerate distress without binge eating.  Medicine.  Weight-loss programs. These can be important if you are overweight. Losing excess weight can improve physical health and the way you feel about yourself.  Follow these instructions at home:  Keep all follow-up visits as directed by your health care provider. This is important.  Take medicines only as directed by your health care provider. Where to find more information:  National Eating Disorders Association (NEDA): www.nationaleatingdisorders.org Contact a health care provider if:  Your symptoms get worse.  You start having new symptoms.  You start compensating for eating binges with  harmful behaviors, such as: ? Making yourself vomit. ? Exercising too much. ? Using laxatives. Get help right away if: You have serious thoughts about hurting yourself or someone else. This information is not intended to replace advice given to you by your health care provider. Make sure you discuss any questions you have with your health care provider. Document Released: 05/23/2010 Document Revised: 06/08/2015 Document Reviewed: 04/12/2013 Elsevier Interactive Patient Education  2018 [employer].

## 2016-07-19 NOTE — Telephone Encounter (Signed)
Attempted to contact the patient and left message for patient to call the office.  

## 2016-07-19 NOTE — Telephone Encounter (Signed)
-----   Message from Bo Merino, MD sent at 07/16/2016  4:51 PM EDT ----- Call and vitamin D 50,000 units once a week total 90 days repeat vitamin D level in 3 months. PTH is high because of low vitamin D

## 2016-07-19 NOTE — Progress Notes (Signed)
   Subjective:    Patient ID: Kayla [name], female    DOB: [birth year], 71 y.o.   MRN: [id number] Chief Complaint  Patient presents with  . Lab results  . Medication Refill    Restasis 0.05%, Bentyl 10 MG, Diltiazem 2 %, Colace 250 MG, Zofran 8 MG    HPI  Sxs are unchanged.  She has not started on the iron supp but will do so today.  She will eat a lot of comfort foods with pasta, potatoes, soups in large quantities until her get abd discomfort. She eats until she is in pain. She gaines 230 lbs. Then 5-10 yrs ago she go tsick and lost her appetite. Now she wants to eat again but now she can' stop and then it causes bloating, and abdominal pain. Wonders if it is emotional. Situation with daughter and son is so stressful and husband gets upset which is her worset stress trigger.  No longer cring all the time which she was so stopped her antidepressant.  She does have the clonazepam which she can't take whenever she gets upset as would be taking it constantly. Keeps the clonazepam to two a day at most and sometimes none. Does not take when she has to leave the house. Does not eat less when she was onit. Has propranaolol for angina.   She needs BP cuff as hypotensive during angina.   Rheumatology wants to put her on daily injections for 2 years Tymos to build bone - Dr. [name].  She was told she was going to die every year. Laser treatment with the chiropractor - $1200 for 3-4 mos of treatment. Medicare will not cover. [place] will consider if medically necessary. She will do this if it gets covered. Dr. Redmond Pulling Husband Laverna Peace.    Review of Systems     Objective:   Physical Exam       BP 99/64 (BP Location: Right Arm, Patient Position: Sitting, Cuff Size: Small)   Pulse 86   Temp 98.3 F (36.8 C) (Oral)   Resp 18   Ht 5\' 5"  (1.651 m)   Wt 111 lb 9.6 oz (50.6 kg)   SpO2 99%   BMI 18.57 kg/m   Assessment & Plan:   1. Idiopathic hypotension   2. Recurrent binge eating   3.  Iron deficiency      [name], M.D.  Primary Care at [facility] [address] [place], [place] [address] [phone] phone [phone] fax  07/22/16 8:04 AM

## 2016-07-22 MED ORDER — VITAMIN D (ERGOCALCIFEROL) 1.25 MG (50000 UNIT) PO CAPS: 50000.0000 [IU] | ORAL_CAPSULE | ORAL | 0 refills | Status: DC

## 2016-07-22 NOTE — Telephone Encounter (Signed)
Patient advised we will start Tymlos after Vitamin D deficiency is treated. Patient will pick up prescription today and start taking. Patient will return in 3 months to recheck level. Patient states she saw her PCP and was advised her Hgb is low and has been placed on iron supplements as well.

## 2016-07-22 NOTE — Telephone Encounter (Signed)
Patient advised of lab results and verbalized understanding.  At visit on 07/12/16 plan was to start patient on Tymlos after labs resulted. Do you still want to continue with this plan?

## 2016-07-22 NOTE — Telephone Encounter (Signed)
Treat vitamin D prior to starting on Tymlos

## 2016-07-23 ENCOUNTER — Telehealth: Payer: Self-pay | Admitting: Family Medicine

## 2016-07-23 ENCOUNTER — Telehealth: Payer: Self-pay

## 2016-07-23 ENCOUNTER — Ambulatory Visit (INDEPENDENT_AMBULATORY_CARE_PROVIDER_SITE_OTHER): Payer: Medicare Other | Admitting: Psychiatry

## 2016-07-23 DIAGNOSIS — F331 Major depressive disorder, recurrent, moderate: Secondary | ICD-10-CM | POA: Diagnosis not present

## 2016-07-23 NOTE — Telephone Encounter (Signed)
Fax sent to pharmacy with dx.

## 2016-07-23 NOTE — Telephone Encounter (Signed)
PA Started for BP monitor  Your PA has been faxed to the plan as a paper copy. Please contact the plan directly if you haven't received a determination in a typical timeframe.  You will be notified of the determination via fax.

## 2016-07-23 NOTE — Telephone Encounter (Signed)
DR Brigitte Pulse PT STATES THAT THE RX THAT YOU WROTE FOR A BLOOD PRESSURE CUFF [facility] [address] WANT FILL IT WITHOUT A DX CODE PLEASE CALL PHARMACY

## 2016-07-25 ENCOUNTER — Telehealth: Payer: Self-pay | Admitting: Internal Medicine

## 2016-07-25 ENCOUNTER — Telehealth: Payer: Self-pay | Admitting: Family Medicine

## 2016-07-25 NOTE — Telephone Encounter (Signed)
ROI faxed to Dr. Gracelyn Nurse [facility]

## 2016-07-25 NOTE — Telephone Encounter (Signed)
Dr. Brigitte Pulse, please see this message. Thanks.

## 2016-07-25 NOTE — Telephone Encounter (Signed)
PATIENT WOULD LIKE TO ASK DR. SHAW IF TOO MUCH SALT CAN CAUSE WATER RETENTION THAT MAKES HER LEGS HURT BADLY? BEST PHONE [phone] (CELL) [place]

## 2016-07-26 NOTE — Telephone Encounter (Signed)
Yes absolutely it can. And happens MUCH more quickly in this type of heat. Treatment is to drink a lot of water to flush the salt out of her system and whenever sitting or laying to have legs elevated (ideally above hip) and whenever upright be walking and moving rather than standing still.  Can wrap legs or wear compression socks during the day if needed.

## 2016-07-29 ENCOUNTER — Telehealth: Payer: Self-pay | Admitting: Internal Medicine

## 2016-07-29 NOTE — Telephone Encounter (Signed)
Patient was given message.  She said thank you very much for your help and she will try those suggestions.

## 2016-08-05 ENCOUNTER — Ambulatory Visit: Payer: Medicare Other | Admitting: Family Medicine

## 2016-08-05 ENCOUNTER — Encounter: Payer: Self-pay | Admitting: Internal Medicine

## 2016-08-06 ENCOUNTER — Ambulatory Visit (INDEPENDENT_AMBULATORY_CARE_PROVIDER_SITE_OTHER): Payer: Medicare Other | Admitting: Psychiatry

## 2016-08-06 DIAGNOSIS — F331 Major depressive disorder, recurrent, moderate: Secondary | ICD-10-CM

## 2016-08-12 ENCOUNTER — Ambulatory Visit: Payer: Medicare Other | Admitting: Rheumatology

## 2016-08-19 ENCOUNTER — Ambulatory Visit (INDEPENDENT_AMBULATORY_CARE_PROVIDER_SITE_OTHER): Payer: Medicare Other | Admitting: Family Medicine

## 2016-08-19 ENCOUNTER — Encounter: Payer: Self-pay | Admitting: Family Medicine

## 2016-08-19 ENCOUNTER — Ambulatory Visit (INDEPENDENT_AMBULATORY_CARE_PROVIDER_SITE_OTHER): Payer: Medicare Other

## 2016-08-19 VITALS — BP 100/57 | HR 84 | Temp 98.6°F | Resp 18 | Ht 65.0 in | Wt 112.8 lb

## 2016-08-19 DIAGNOSIS — R6889 Other general symptoms and signs: Secondary | ICD-10-CM | POA: Diagnosis not present

## 2016-08-19 DIAGNOSIS — R0681 Apnea, not elsewhere classified: Secondary | ICD-10-CM | POA: Diagnosis not present

## 2016-08-19 DIAGNOSIS — R5382 Chronic fatigue, unspecified: Secondary | ICD-10-CM | POA: Diagnosis not present

## 2016-08-19 DIAGNOSIS — R609 Edema, unspecified: Secondary | ICD-10-CM

## 2016-08-19 DIAGNOSIS — R55 Syncope and collapse: Secondary | ICD-10-CM

## 2016-08-19 DIAGNOSIS — I739 Peripheral vascular disease, unspecified: Secondary | ICD-10-CM | POA: Diagnosis not present

## 2016-08-19 DIAGNOSIS — R42 Dizziness and giddiness: Secondary | ICD-10-CM | POA: Diagnosis not present

## 2016-08-19 DIAGNOSIS — R0602 Shortness of breath: Secondary | ICD-10-CM | POA: Diagnosis not present

## 2016-08-19 DIAGNOSIS — R600 Localized edema: Secondary | ICD-10-CM

## 2016-08-19 LAB — POCT URINALYSIS DIP (MANUAL ENTRY)
BILIRUBIN UA: NEGATIVE
GLUCOSE UA: NEGATIVE mg/dL
Ketones, POC UA: NEGATIVE mg/dL
Leukocytes, UA: NEGATIVE
Nitrite, UA: NEGATIVE
PROTEIN UA: NEGATIVE mg/dL
RBC UA: NEGATIVE
SPEC GRAV UA: 1.02 (ref 1.010–1.025)
UROBILINOGEN UA: 0.2 U/dL
pH, UA: 5.5 (ref 5.0–8.0)

## 2016-08-19 MED ORDER — FUROSEMIDE 40 MG PO TABS: 40.0000 mg | ORAL_TABLET | Freq: Every day | ORAL | 0 refills | Status: DC | PRN

## 2016-08-19 MED ORDER — POTASSIUM CHLORIDE CRYS ER 20 MEQ PO TBCR: 20.0000 meq | EXTENDED_RELEASE_TABLET | Freq: Every day | ORAL | 0 refills | Status: DC | PRN

## 2016-08-19 NOTE — Patient Instructions (Addendum)
Please try stopping the gabapentin. You may want to consider weaning down by 1 tablet a every 2-3 days. If you are off the gabapentin for 1-2 weeks and have no improvement in your symptoms, then you may restart it.  Start trying to take one of the fluid pills furosemide along with a prescription potassium pill every morning. He will notice increased urination for the next 3-4 hours. You may stop taking this as soon as the edema resolves. Make sure you are sticking to a very high protein diet and drinking plenty of water. If you have any muscle cramping, please give extra potassium foods in your diet.    IF you received an x-ray today, you will receive an invoice from [facility]. Please contact [facility] at [phone] with questions or concerns regarding your invoice.   IF you received labwork today, you will receive an invoice from [place]. Please contact [facility] at [phone] with questions or concerns regarding your invoice.   Our billing staff will not be able to assist you with questions regarding bills from these companies.  You will be contacted with the lab results as soon as they are available. The fastest way to get your results is to activate your My Chart account. Instructions are located on the last page of this paperwork. If you have not heard from [place] regarding the results in 2 weeks, please contact this office.     Edema Edema is an abnormal buildup of fluids in your bodytissues. Edema is somewhatdependent on gravity to pull the fluid to the lowest place in your body. That makes the condition more common in the legs and thighs (lower extremities). Painless swelling of the feet and ankles is common and becomes more likely as you get older. It is also common in looser tissues, like around your eyes. When the affected area is squeezed, the fluid may move out of that spot and leave a dent for a few moments. This dent is called pitting. What are the  causes? There are many possible causes of edema. Eating too much salt and being on your feet or sitting for a long time can cause edema in your legs and ankles. Hot weather may make edema worse. Common medical causes of edema include:  Heart failure.  Liver disease.  Kidney disease.  Weak blood vessels in your legs.  Cancer.  An injury.  Pregnancy.  Some medications.  Obesity.  What are the signs or symptoms? Edema is usually painless.Your skin may look swollen or shiny. How is this diagnosed? Your health care provider may be able to diagnose edema by asking about your medical history and doing a physical exam. You may need to have tests such as X-rays, an electrocardiogram, or blood tests to check for medical conditions that may cause edema. How is this treated? Edema treatment depends on the cause. If you have heart, liver, or kidney disease, you need the treatment appropriate for these conditions. General treatment may include:  Elevation of the affected body part above the level of your heart.  Compression of the affected body part. Pressure from elastic bandages or support stockings squeezes the tissues and forces fluid back into the blood vessels. This keeps fluid from entering the tissues.  Restriction of fluid and salt intake.  Use of a water pill (diuretic). These medications are appropriate only for some types of edema. They pull fluid out of your body and make you urinate more often. This gets rid of fluid and reduces swelling,  but diuretics can have side effects. Only use diuretics as directed by your health care provider.  Follow these instructions at home:  Keep the affected body part above the level of your heart when you are lying down.  Do not sit still or stand for prolonged periods.  Do not put anything directly under your knees when lying down.  Do not wear constricting clothing or garters on your upper legs.  Exercise your legs to work the fluid  back into your blood vessels. This may help the swelling go down.  Wear elastic bandages or support stockings to reduce ankle swelling as directed by your health care provider.  Eat a low-salt diet to reduce fluid if your health care provider recommends it.  Only take medicines as directed by your health care provider. Contact a health care provider if:  Your edema is not responding to treatment.  You have heart, liver, or kidney disease and notice symptoms of edema.  You have edema in your legs that does not improve after elevating them.  You have sudden and unexplained weight gain. Get help right away if:  You develop shortness of breath or chest pain.  You cannot breathe when you lie down.  You develop pain, redness, or warmth in the swollen areas.  You have heart, liver, or kidney disease and suddenly get edema.  You have a fever and your symptoms suddenly get worse. This information is not intended to replace advice given to you by your health care provider. Make sure you discuss any questions you have with your health care provider. Document Released: 12/31/2004 Document Revised: 06/08/2015 Document Reviewed: 10/23/2012 Elsevier Interactive Patient Education  2017 [place]. Potassium Content of Foods Potassium is a mineral found in many foods and drinks. It helps keep fluids and minerals balanced in your body and affects how steadily your heart beats. Potassium also helps control your blood pressure and keep your muscles and nervous system healthy. Certain health conditions and medicines may change the balance of potassium in your body. When this happens, you can help balance your level of potassium through the foods that you do or do not eat. Your health care provider or dietitian may recommend an amount of potassium that you should have each day. The following lists of foods provide the amount of potassium (in parentheses) per serving in each item. High in potassium The  following foods and beverages have 200 mg or more of potassium per serving:  Apricots, 2 raw or 5 dry (200 mg).  Artichoke, 1 medium (345 mg).  Avocado, raw,  each (245 mg).  Banana, 1 medium (425 mg).  Beans, lima, or baked beans, canned,  cup (280 mg).  Beans, white, canned,  cup (595 mg).  Beef roast, 3 oz (320 mg).  Beef, ground, 3 oz (270 mg).  Beets, raw or cooked,  cup (260 mg).  Bran muffin, 2 oz (300 mg).  Broccoli,  cup (230 mg).  Brussels sprouts,  cup (250 mg).  Cantaloupe,  cup (215 mg).  Cereal, 100% bran,  cup (200-400 mg).  Cheeseburger, single, fast food, 1 each (225-400 mg).  Chicken, 3 oz (220 mg).  Clams, canned, 3 oz (535 mg).  Crab, 3 oz (225 mg).  Dates, 5 each (270 mg).  Dried beans and peas,  cup (300-475 mg).  Figs, dried, 2 each (260 mg).  Fish: halibut, tuna, cod, snapper, 3 oz (480 mg).  Fish: salmon, haddock, swordfish, perch, 3 oz (300 mg).  Fish, tuna,  canned 3 oz (200 mg).  [place] fries, fast food, 3 oz (470 mg).  Granola with fruit and nuts,  cup (200 mg).  Grapefruit juice,  cup (200 mg).  Greens, beet,  cup (655 mg).  Honeydew melon,  cup (200 mg).  Kale, raw, 1 cup (300 mg).  Kiwi, 1 medium (240 mg).  Kohlrabi, rutabaga, parsnips,  cup (280 mg).  Lentils,  cup (365 mg).  Mango, 1 each (325 mg).  Milk, chocolate, 1 cup (420 mg).  Milk: nonfat, low-fat, whole, buttermilk, 1 cup (350-380 mg).  Molasses, 1 Tbsp (295 mg).  Mushrooms,  cup (280) mg.  Nectarine, 1 each (275 mg).  Nuts: almonds, peanuts, hazelnuts, [place], cashew, mixed, 1 oz (200 mg).  Nuts, pistachios, 1 oz (295 mg).  Orange, 1 each (240 mg).  Orange juice,  cup (235 mg).  Papaya, medium,  fruit (390 mg).  Peanut butter, chunky, 2 Tbsp (240 mg).  Peanut butter, smooth, 2 Tbsp (210 mg).  Pear, 1 medium (200 mg).  Pomegranate, 1 whole (400 mg).  Pomegranate juice,  cup (215 mg).  Pork, 3 oz (350  mg).  Potato chips, salted, 1 oz (465 mg).  Potato, baked with skin, 1 medium (925 mg).  Potatoes, boiled,  cup (255 mg).  Potatoes, mashed,  cup (330 mg).  Prune juice,  cup (370 mg).  Prunes, 5 each (305 mg).  Pudding, chocolate,  cup (230 mg).  Pumpkin, canned,  cup (250 mg).  Raisins, seedless,  cup (270 mg).  Seeds, sunflower or pumpkin, 1 oz (240 mg).  Soy milk, 1 cup (300 mg).  Spinach,  cup (420 mg).  Spinach, canned,  cup (370 mg).  Sweet potato, baked with skin, 1 medium (450 mg).  Swiss chard,  cup (480 mg).  Tomato or vegetable juice,  cup (275 mg).  Tomato sauce or puree,  cup (400-550 mg).  Tomato, raw, 1 medium (290 mg).  Tomatoes, canned,  cup (200-300 mg).  [place], 3 oz (250 mg).  Wheat germ, 1 oz (250 mg).  Winter squash,  cup (250 mg).  Yogurt, plain or fruited, 6 oz (260-435 mg).  Zucchini,  cup (220 mg).  Moderate in potassium The following foods and beverages have 50-200 mg of potassium per serving:  Apple, 1 each (150 mg).  Apple juice,  cup (150 mg).  Applesauce,  cup (90 mg).  Apricot nectar,  cup (140 mg).  Asparagus, small spears,  cup or 6 spears (155 mg).  Bagel, cinnamon raisin, 1 each (130 mg).  Bagel, egg or plain, 4 in., 1 each (70 mg).  Beans, green,  cup (90 mg).  Beans, yellow,  cup (190 mg).  Beer, regular, 12 oz (100 mg).  Beets, canned,  cup (125 mg).  Blackberries,  cup (115 mg).  Blueberries,  cup (60 mg).  Bread, whole wheat, 1 slice (70 mg).  Broccoli, raw,  cup (145 mg).  Cabbage,  cup (150 mg).  Carrots, cooked or raw,  cup (180 mg).  Cauliflower, raw,  cup (150 mg).  Celery, raw,  cup (155 mg).  Cereal, bran flakes, cup (120-150 mg).  Cheese, cottage,  cup (110 mg).  Cherries, 10 each (150 mg).  Chocolate, 1 oz bar (165 mg).  Coffee, brewed 6 oz (90 mg).  Corn,  cup or 1 ear (195 mg).  Cucumbers,  cup (80 mg).  Egg, large, 1 each (60  mg).  Eggplant,  cup (60 mg).  Endive, raw, cup (80 mg).  English muffin,  1 each (65 mg).  Fish, orange roughy, 3 oz (150 mg).  Frankfurter, beef or pork, 1 each (75 mg).  Fruit cocktail,  cup (115 mg).  Grape juice,  cup (170 mg).  Grapefruit,  fruit (175 mg).  Grapes,  cup (155 mg).  Greens: kale, turnip, collard,  cup (110-150 mg).  Ice cream or frozen yogurt, chocolate,  cup (175 mg).  Ice cream or frozen yogurt, vanilla,  cup (120-150 mg).  Lemons, limes, 1 each (80 mg).  Lettuce, all types, 1 cup (100 mg).  Mixed vegetables,  cup (150 mg).  Mushrooms, raw,  cup (110 mg).  Nuts: walnuts, pecans, or macadamia, 1 oz (125 mg).  Oatmeal,  cup (80 mg).  Okra,  cup (110 mg).  Onions, raw,  cup (120 mg).  Peach, 1 each (185 mg).  Peaches, canned,  cup (120 mg).  Pears, canned,  cup (120 mg).  Peas, green, frozen,  cup (90 mg).  Peppers, green,  cup (130 mg).  Peppers, red,  cup (160 mg).  Pineapple juice,  cup (165 mg).  Pineapple, fresh or canned,  cup (100 mg).  Plums, 1 each (105 mg).  Pudding, vanilla,  cup (150 mg).  Raspberries,  cup (90 mg).  Rhubarb,  cup (115 mg).  Rice, wild,  cup (80 mg).  Shrimp, 3 oz (155 mg).  Spinach, raw, 1 cup (170 mg).  Strawberries,  cup (125 mg).  Summer squash  cup (175-200 mg).  Swiss chard, raw, 1 cup (135 mg).  Tangerines, 1 each (140 mg).  Tea, brewed, 6 oz (65 mg).  Turnips,  cup (140 mg).  Watermelon,  cup (85 mg).  Wine, red, table, 5 oz (180 mg).  Wine, white, table, 5 oz (100 mg).  Low in potassium The following foods and beverages have less than 50 mg of potassium per serving.  Bread, white, 1 slice (30 mg).  Carbonated beverages, 12 oz (less than 5 mg).  Cheese, 1 oz (20-30 mg).  Cranberries,  cup (45 mg).  Cranberry juice cocktail,  cup (20 mg).  Fats and oils, 1 Tbsp (less than 5 mg).  Hummus, 1 Tbsp (32 mg).  Nectar: papaya, mango,  or pear,  cup (35 mg).  Rice, white or brown,  cup (50 mg).  Spaghetti or macaroni,  cup cooked (30 mg).  Tortilla, flour or corn, 1 each (50 mg).  Waffle, 4 in., 1 each (50 mg).  Water chestnuts,  cup (40 mg).  This information is not intended to replace advice given to you by your health care provider. Make sure you discuss any questions you have with your health care provider. Document Released: 08/14/2004 Document Revised: 06/08/2015 Document Reviewed: 11/27/2012 Elsevier Interactive Patient Education  [employer].

## 2016-08-19 NOTE — Progress Notes (Signed)
Subjective:    Patient ID: Kayla [name], female    DOB: 09/27/1945, 71 y.o.   MRN: [id number] Chief Complaint  Patient presents with  . Edema    bilateral lower extremeties     HPI Kayla [name] is having throbbing, itching, toes aching and tingling.  Her calves and ankles are really hurting while walking - can even radiate up to her thighs.  Ached all night long, getting worse. Having a difficult time putting shoes on.   No SHoB, does have angina worse at night. Does states she might have some slight DOE.  Has a bed that can elevate so has been putting up feet overnight.   + lightheadedness and dizziness more - worse with ambulating. SHe becomes presyncopal while going up the stairs. No falls but she does lose her balance and bounce of the walls due to this.   Dr. Rochele Pages reluctantly started her on hydrocodone 1/2 tab bid last mo for her severe neck pain with bone spurs but was told she would have to come off.   Osteoporosis: Seen by rheum  Husband has noticed that she stops breathing o/n.  Past Medical History:  Diagnosis Date  . Allergy   . Anemia    anemia in the past  . Angina    takes Propanolol prn and it stops her chest  . Anxiety   . Barrett esophagus    not consistently present  . Cataract   . Chronic kidney disease    kidney stones and UTI  . Complication of anesthesia    either  BP or pulse dropped with last surgery  . DDD (degenerative disc disease)    cervical and lumbar  . Dementia   . Depression    post traumatic stress  disorder  . Diabetes mellitus    sugar goes up and down diet controlled  . Dysrhythmia    brought on by stress  . Fatigue   . Fibromyalgia    neuropathy knees ankles and toes, bladder  . GERD (gastroesophageal reflux disease)   . H/O hyperparathyroidism   . Headache   . Headache(784.0)    migraines  . Hiatal hernia   . History of kidney stones   . Hypertension    3 small brain aneurysms  . IBS (irritable bowel syndrome)   .  Macular degeneration   . Migraines   . Osteoarthritis    all over  . Osteopetrosis   . Osteoporosis   . Peripheral vascular disease (HCC)    superficial phlebitis in left calf  . Personal history of colonic polyps 07/22/2013   07/2013 - 3 small adenomas - repeat colonoscopy 2018   Past Surgical History:  Procedure Laterality Date  . ABDOMINAL HYSTERECTOMY    . APPENDECTOMY    . CARDIAC CATHETERIZATION  03/22/1991   EF 61%  . CARDIOVASCULAR STRESS TEST  10/03/2006  . CHOLECYSTECTOMY    . COLONOSCOPY  06/23/2008   normal  . DILATION AND CURETTAGE OF UTERUS     x2  . ESOPHAGUS SURGERY    . EXPLORATORY LAPAROTOMY  1993  . GASTRIC BYPASS  1999  . GASTRIC RESTRICTION SURGERY  1991  . Right Arm Surgery    . Right Knee Arthroscopy    . Rt wrist fx  2009  . stomach stappeling  1991  . STONE EXTRACTION WITH BASKET  2012  . TONSILLECTOMY    . TUBAL LIGATION    . UPPER GASTROINTESTINAL ENDOSCOPY  06/23/2008   w/Dil, Barrett's  esophagus  . US ECHOCARDIOGRAPHY  01/21/2007   EF 55-60%  . US ECHOCARDIOGRAPHY  08/30/2003   EF 55-60%   Current Outpatient Prescriptions on File Prior to Visit  Medication Sig Dispense Refill  . Ascorbic Acid (VITAMIN C) 1000 MG tablet Take 1,000 mg by mouth daily.    . beta carotene w/minerals (OCUVITE) tablet Take 1 tablet by mouth 2 (two) times daily.    Marland Kitchen CALCIUM-MAGNESIUM-ZINC PO Take by mouth daily.    . celecoxib (CELEBREX) 200 MG capsule Take 1 capsule (200 mg total) by mouth daily as needed. 90 capsule 1  . Cholecalciferol (VITAMIN D3) 2000 UNITS TABS Take 2,000 Units by mouth daily.    . clonazePAM (KLONOPIN) 1 MG tablet Take 0.5-1 tablets (0.5-1 mg total) by mouth 2 (two) times daily as needed. anxiety/panic attacks. May fill on or after March 2018. This is a 3 month supply 180 tablet 1  . Cyanocobalamin (VITAMIN B-12) 1000 MCG SUBL Place 5,000 mcg under the tongue daily.     . cyclobenzaprine (FLEXERIL) 10 MG tablet Take 1 tablet (10 mg total) by mouth 3  (three) times daily as needed for muscle spasms. 270 tablet 1  . cycloSPORINE (RESTASIS) 0.05 % ophthalmic emulsion 2 (two) times daily.    . diclofenac sodium (VOLTAREN) 1 % GEL Apply 2 g topically 4 (four) times daily. 700 g 1  . dicyclomine (BENTYL) 10 MG capsule Take 1-2 tab 3 times daily AC as needed for spasms and cramping. 270 capsule 1  . diltiazem 2 % GEL Apply 1 application topically 2 (two) times daily. 30 g 0  . docusate sodium (COLACE) 250 MG capsule Take 250 mg by mouth daily.    . DULoxetine (CYMBALTA) 60 MG capsule Take 1 capsule (60 mg total) by mouth daily. 90 capsule 1  . esomeprazole (NEXIUM) 40 MG capsule Take 1 capsule (40 mg total) by mouth daily before breakfast. 90 capsule 1  . fentaNYL (DURAGESIC - DOSED MCG/HR) 12 MCG/HR Place 12.5 mcg onto the skin every 3 (three) days.    . fentaNYL (DURAGESIC - DOSED MCG/HR) 50 MCG/HR Place 50 mcg onto the skin every 3 (three) days.    Marland Kitchen gabapentin (NEURONTIN) 300 MG capsule Take 1 capsule (300 mg total) by mouth 3 (three) times daily. 270 capsule 1  . hypromellose (SYSTANE OVERNIGHT THERAPY) 0.3 % GEL ophthalmic ointment Place 1 drop into both eyes at bedtime. Reported on 02/11/2015    . lidocaine (LIDODERM) 5 % Place 1 patch onto the skin as needed. Remove & Discard patch within 12 hours. May dispense as 3 month supply 90 patch 3  . Lifitegrast (XIIDRA) 5 % SOLN Apply to eye.    . Loteprednol Etabonate (LOTEMAX) 0.5 % GEL Place 1 drop into both eyes 2 (two) times daily. Reported on 02/11/2015    . mirabegron ER (MYRBETRIQ) 25 MG TB24 tablet Take 1 tablet (25 mg total) by mouth daily. 90 tablet 1  . Naloxone HCl 0.4 MG/0.4ML SOAJ Administer 0.4mg  [place] at first sign of opioid overdose and repeat every 2 minutes as needed for resuscitation. Call 911 immediately 5 Package 0  . Omega-3 Fatty Acids (SEA-OMEGA PO) Take 1,000 mg by mouth daily.    . ondansetron (ZOFRAN) 8 MG tablet Take 1 tablet (8 mg total) by mouth every 8 (eight) hours as  needed for nausea or vomiting. 20 tablet 0  . polyethylene glycol powder (GLYCOLAX/MIRALAX) powder Take 17 g by mouth 2 (two) times daily. Reported on 03/08/2015  850 g 1  . propranolol (INDERAL) 10 MG tablet Take 1 tablet (10 mg total) by mouth 4 (four) times daily. 360 tablet 1  . pyridoxine (B-6) 200 MG tablet Take 200 mg by mouth daily.    . sucralfate (CARAFATE) 1 GM/10ML suspension Take 10 mLs (1 g total) by mouth 4 (four) times daily -  with meals and at bedtime. 420 mL 3  . SUMAtriptan (IMITREX) 100 MG tablet Take 1 tablet at earliest onset of headache, may repeat in 2 hours if headache persists or reoccurs. 30 tablet 1  . traZODone (DESYREL) 100 MG tablet Take 0.5-1 tablets (50-100 mg total) by mouth at bedtime as needed for sleep. And 1/2 -1 if wakes at 4am. 90 tablet 1  . Vitamin D, Ergocalciferol, (DRISDOL) 50000 units CAPS capsule Take 1 capsule (50,000 Units total) by mouth every 7 (seven) days. 12 capsule 0  . [DISCONTINUED] buPROPion (WELLBUTRIN) 75 MG tablet Take 75 mg by mouth daily after breakfast.      Current Facility-Administered Medications on File Prior to Visit  Medication Dose Route Frequency Provider Last Rate Last Dose  . cyanocobalamin ((VITAMIN B-12)) injection 1,000 mcg  1,000 mcg Intramuscular Q30 days Shawnee Knapp, MD   1,000 mcg at 07/01/16 1646   Allergies  Allergen Reactions  . Ambien [Zolpidem Tartrate]     Hallucinations  . Codeine Anaphylaxis  . Oxycodone-Acetaminophen Shortness Of Breath  . Tylox [Oxycodone-Acetaminophen] Anaphylaxis    Chest pain  . Darvocet [Propoxyphene N-Acetaminophen]     Chest pain  . Darvon [Propoxyphene] Itching  . Food Swelling    Peanuts-wheat-  . Lorcet [Hydrocodone-Acetaminophen]     Chest pain  . Opium     hallucinations  . Vicodin [Hydrocodone-Acetaminophen] Other (See Comments)    Chest Pain   . Wheat Bran   . Zolpidem Tartrate Other (See Comments)    Makes her go crazy  . Zolpidem Tartrate     hallucinations    . Amoxicillin Nausea And Vomiting    Reaction:  Migraine headache  . Penicillins Nausea And Vomiting    Migraine Headaches   Family History  Problem Relation Age of Onset  . Stroke Mother   . Lung cancer Mother   . Heart disease Mother   . Diabetes Mother   . Hypertension Father   . Stroke Father   . Lung cancer Father   . Heart disease Father   . Kidney disease Father   . Seizures Father        epilepsy, and sisters x 2  . Pancreatic cancer Sister   . Lung cancer Sister   . Colon cancer Maternal Aunt        12 relatives  . Uterine cancer Unknown        aunt  . Heart disease Unknown        grandmother  . Clotting disorder Sister   . Heart disease Sister        x 2  . Kidney disease Sister        x 2  . Breast cancer Sister   . Colon cancer Paternal Aunt   . Colon cancer Paternal Aunt    Social History   Social History  . Marital status: Married    Spouse name: Rushie Chestnut.  . Number of children: 2  . Years of education: N/A   Occupational History  . Retired   .  Retired   Social History Main Topics  . Smoking status: Never  Smoker  . Smokeless tobacco: Never Used  . Alcohol use No  . Drug use: No  . Sexual activity: Not Currently   Other Topics Concern  . None   Social History Narrative  . None   Depression screen [facility] 2/9 08/19/2016 07/19/2016 07/01/2016 06/27/2016 02/26/2016  Decreased Interest 0 0 0 0 0  Down, Depressed, Hopeless 0 0 0 0 0  PHQ - 2 Score 0 0 0 0 0  Altered sleeping - - - - -  Tired, decreased energy - - - - -  Change in appetite - - - - -  Feeling bad or failure about yourself  - - - - -  Trouble concentrating - - - - -  Moving slowly or fidgety/restless - - - - -  Suicidal thoughts - - - - -  PHQ-9 Score - - - - -  Difficult doing work/chores - - - - -  Some recent data might be hidden     Review of Systems See hpi    Objective:   Physical Exam  Constitutional: She is oriented to person, place, and time. She appears  well-developed and well-nourished. No distress.  HENT:  Head: Normocephalic and atraumatic.  Right Ear: External ear normal.  Left Ear: External ear normal.  Eyes: Conjunctivae are normal. No scleral icterus.  Neck: Normal range of motion. Neck supple. No thyromegaly present.  Cardiovascular: Normal rate, regular rhythm, normal heart sounds and intact distal pulses.   Pulmonary/Chest: Effort normal and breath sounds normal. No respiratory distress.  Musculoskeletal: She exhibits no edema.  Lymphadenopathy:    She has no cervical adenopathy.  Neurological: She is alert and oriented to person, place, and time.  Skin: Skin is warm and dry. She is not diaphoretic. No erythema.  Psychiatric: She has a normal mood and affect. Her behavior is normal.      BP (!) 100/57   Pulse 84   Temp 98.6 F (37 C) (Oral)   Resp 18   Ht 5\' 5"  (1.651 m)   Wt 112 lb 12.8 oz (51.2 kg)   SpO2 98%   BMI 18.77 kg/m     Negative orthostatics [facility] reading (PRIMARY) by  Dr. Brigitte Pulse. EKG: NSR, no ischemic changes  Dg Chest 2 View  Result Date: 08/19/2016 CLINICAL DATA:  Shortness of breath on exertion, angina, worsening pedal edema, presyncope. EXAM: CHEST  2 VIEW COMPARISON:  12/25/2013. FINDINGS: Trachea is midline. Heart size normal. Lungs are hyperinflated but clear. No pleural fluid. Postsurgical changes in the epigastric region. IMPRESSION: Hyperinflation without acute finding. Electronically Signed   By: Lorin Picket M.D.   On: 08/19/2016 15:15    Assessment & Plan:  Would like rx to [place] for bp cuff.   1. Claudication ([place]) - check abi  2. Peripheral edema - start prn lasix w/ k  3. Postural dizziness with presyncope   4. Lightheadedness   5. Exercise intolerance   6. Witnessed episode of apnea   7. Chronic fatigue     Orders Placed This Encounter  Procedures  . DG Chest 2 View    Standing Status:   Future    Number of Occurrences:   1    Standing Expiration Date:    08/19/2017    Order Specific Question:   Reason for Exam (SYMPTOM  OR DIAGNOSIS REQUIRED)    Answer:   DOE, angina, worsening pedal edema, presyncope    Order Specific Question:   Preferred imaging location?  Answer:   External  . Comprehensive metabolic panel  . CBC  . Ambulatory referral to Sleep Studies    Referral Priority:   Routine    Referral Type:   Consultation    Referral Reason:   Specialty Services Required    Number of Visits Requested:   1  . Orthostatic vital signs  . POCT urinalysis dipstick  . EKG 12-Lead    Meds ordered this encounter  Medications  . furosemide (LASIX) 40 MG tablet    Sig: Take 1 tablet (40 mg total) by mouth daily as needed for fluid or edema.    Dispense:  30 tablet    Refill:  0  . potassium chloride SA (K-DUR,KLOR-CON) 20 MEQ tablet    Sig: Take 1 tablet (20 mEq total) by mouth daily as needed (always take along with the lasix).    Dispense:  30 tablet    Refill:  0    [name], M.D.  Primary Care at [facility] [place], [place] [address] [phone] phone [phone] fax  08/21/16 7:16 PM  Results for orders placed or performed in visit on 08/19/16  Comprehensive metabolic panel  Result Value Ref Range   Glucose 78 65 - 99 mg/dL   BUN 20 8 - 27 mg/dL   Creatinine, Ser 0.55 (L) 0.57 - 1.00 mg/dL   GFR calc non Af Amer 97 >59 mL/min/1.73   GFR calc Af Amer 111 >59 mL/min/1.73   BUN/Creatinine Ratio 36 (H) 12 - 28   Sodium 143 134 - 144 mmol/L   Potassium 5.2 3.5 - 5.2 mmol/L   Chloride 102 96 - 106 mmol/L   CO2 27 20 - 29 mmol/L   Calcium 9.2 8.7 - 10.3 mg/dL   Total Protein 5.9 (L) 6.0 - 8.5 g/dL   Albumin 3.9 3.6 - 4.8 g/dL   Globulin, Total 2.0 1.5 - 4.5 g/dL   Albumin/Globulin Ratio 2.0 1.2 - 2.2   Bilirubin Total 0.3 0.0 - 1.2 mg/dL   Alkaline Phosphatase 114 39 - 117 IU/L   AST 30 0 - 40 IU/L   ALT 27 0 - 32 IU/L  CBC  Result Value Ref Range   WBC 5.4 3.4 - 10.8 x10E3/uL   RBC 4.01  3.77 - 5.28 x10E6/uL   Hemoglobin 12.0 11.1 - 15.9 g/dL   Hematocrit 36.9 34.0 - 46.6 %   MCV 92 79 - 97 fL   MCH 29.9 26.6 - 33.0 pg   MCHC 32.5 31.5 - 35.7 g/dL   RDW 14.6 12.3 - 15.4 %   Platelets 234 150 - 379 x10E3/uL  POCT urinalysis dipstick  Result Value Ref Range   Color, UA yellow yellow   Clarity, UA clear clear   Glucose, UA negative negative mg/dL   Bilirubin, UA negative negative   Ketones, POC UA negative negative mg/dL   Spec Grav, UA 1.020 1.010 - 1.025   Blood, UA negative negative   pH, UA 5.5 5.0 - 8.0   Protein Ur, POC negative negative mg/dL   Urobilinogen, UA 0.2 0.2 or 1.0 E.U./dL   Nitrite, UA Negative Negative   Leukocytes, UA Negative Negative

## 2016-08-19 NOTE — Telephone Encounter (Signed)
Telephone call to patient inquiring about whether or not she has received her bp monitor.  She stated that she has not heard anything about it.  I told her I would call them again and find out what the status is. I advised her that I would call her back today or tomorrow. Number for [facility] is [phone].  When I called there was a 24 minute wait time to reach a rep.  I will call back later today or tomorrow.

## 2016-08-19 NOTE — Progress Notes (Deleted)
   Subjective:    Patient ID: Kayla [name], female    DOB: 30-Jan-1945, 71 y.o.   MRN: [id number]  HPI    Osteoporosis: Seen by rheum  Review of Systems     Objective:   Physical Exam        Assessment & Plan:   1. Claudication ([place])   2. Peripheral edema   3. Postural dizziness with presyncope   4. Lightheadedness   5. Exercise intolerance   6. Witnessed episode of apnea   7. Chronic fatigue     Orders Placed This Encounter  Procedures  . DG Chest 2 View    Standing Status:   Future    Number of Occurrences:   1    Standing Expiration Date:   08/19/2017    Order Specific Question:   Reason for Exam (SYMPTOM  OR DIAGNOSIS REQUIRED)    Answer:   DOE, angina, worsening pedal edema, presyncope    Order Specific Question:   Preferred imaging location?    Answer:   External  . Comprehensive metabolic panel  . CBC  . Ambulatory referral to Sleep Studies    Referral Priority:   Routine    Referral Type:   Consultation    Referral Reason:   Specialty Services Required    Number of Visits Requested:   1  . Orthostatic vital signs  . POCT urinalysis dipstick  . EKG 12-Lead    Meds ordered this encounter  Medications  . furosemide (LASIX) 40 MG tablet    Sig: Take 1 tablet (40 mg total) by mouth daily as needed for fluid or edema.    Dispense:  30 tablet    Refill:  0  . potassium chloride SA (K-DUR,KLOR-CON) 20 MEQ tablet    Sig: Take 1 tablet (20 mEq total) by mouth daily as needed (always take along with the lasix).    Dispense:  30 tablet    Refill:  0     [name], M.D.  Primary Care at [facility] [address] [place], [place] [address] [phone] phone [phone] fax  08/21/16 7:15 PM

## 2016-08-20 ENCOUNTER — Ambulatory Visit (HOSPITAL_COMMUNITY)
Admission: RE | Admit: 2016-08-20 | Discharge: 2016-08-20 | Disposition: A | Discharge status: Home / Self Care | Payer: Medicare Other | Source: Ambulatory Visit | Attending: Vascular Surgery | Admitting: Vascular Surgery

## 2016-08-20 ENCOUNTER — Ambulatory Visit (INDEPENDENT_AMBULATORY_CARE_PROVIDER_SITE_OTHER): Payer: Medicare Other | Admitting: Psychiatry

## 2016-08-20 DIAGNOSIS — R609 Edema, unspecified: Secondary | ICD-10-CM | POA: Insufficient documentation

## 2016-08-20 DIAGNOSIS — F331 Major depressive disorder, recurrent, moderate: Secondary | ICD-10-CM

## 2016-08-20 DIAGNOSIS — I739 Peripheral vascular disease, unspecified: Secondary | ICD-10-CM | POA: Insufficient documentation

## 2016-08-20 DIAGNOSIS — R6889 Other general symptoms and signs: Secondary | ICD-10-CM | POA: Diagnosis not present

## 2016-08-20 LAB — CBC
HEMATOCRIT: 36.9 % (ref 34.0–46.6)
HEMOGLOBIN: 12 g/dL (ref 11.1–15.9)
MCH: 29.9 pg (ref 26.6–33.0)
MCHC: 32.5 g/dL (ref 31.5–35.7)
MCV: 92 fL (ref 79–97)
Platelets: 234 10*3/uL (ref 150–379)
RBC: 4.01 x10E6/uL (ref 3.77–5.28)
RDW: 14.6 % (ref 12.3–15.4)
WBC: 5.4 10*3/uL (ref 3.4–10.8)

## 2016-08-20 LAB — COMPREHENSIVE METABOLIC PANEL
A/G RATIO: 2 (ref 1.2–2.2)
ALBUMIN: 3.9 g/dL (ref 3.6–4.8)
ALT: 27 IU/L (ref 0–32)
AST: 30 IU/L (ref 0–40)
Alkaline Phosphatase: 114 IU/L (ref 39–117)
BUN/Creatinine Ratio: 36 — ABNORMAL HIGH (ref 12–28)
BUN: 20 mg/dL (ref 8–27)
Bilirubin Total: 0.3 mg/dL (ref 0.0–1.2)
CALCIUM: 9.2 mg/dL (ref 8.7–10.3)
CHLORIDE: 102 mmol/L (ref 96–106)
CO2: 27 mmol/L (ref 20–29)
Creatinine, Ser: 0.55 mg/dL — ABNORMAL LOW (ref 0.57–1.00)
GFR calc Af Amer: 111 mL/min/{1.73_m2} (ref >59)
GFR calc non Af Amer: 97 mL/min/{1.73_m2} (ref >59)
Globulin, Total: 2 g/dL (ref 1.5–4.5)
Glucose: 78 mg/dL (ref 65–99)
Potassium: 5.2 mmol/L (ref 3.5–5.2)
Sodium: 143 mmol/L (ref 134–144)
TOTAL PROTEIN: 5.9 g/dL — AB (ref 6.0–8.5)

## 2016-08-20 NOTE — Telephone Encounter (Signed)
[place] regarding BP monitor and was told that PA is only required for claims over $2000. I LMVM notifying patient.

## 2016-08-21 ENCOUNTER — Telehealth: Payer: Self-pay | Admitting: Family Medicine

## 2016-08-21 ENCOUNTER — Telehealth: Payer: Self-pay

## 2016-08-21 ENCOUNTER — Encounter: Payer: Self-pay | Admitting: Family Medicine

## 2016-08-21 NOTE — Telephone Encounter (Signed)
Perfect. Thanks.

## 2016-08-21 NOTE — Telephone Encounter (Signed)
Per patient request, order for BP cuff and machine was faxed to [facility] because they will send her one for free.

## 2016-08-21 NOTE — Telephone Encounter (Signed)
i'm glad the medication worked so quickly. She does not need to take  Further if the swelling is resolved. If it recurs, she should try 1/2 tab of lasix and take a second tab of the potassium later in the day if she has the cramping again.  Will refer for a sleep study.

## 2016-08-21 NOTE — Telephone Encounter (Signed)
See note below. Please advise.  

## 2016-08-21 NOTE — Telephone Encounter (Signed)
Pt wanted to give message to Dr. Brigitte Pulse pt Was put on furosemide (LASIX) 40 MG tablet [[id number]] and swelling went down pt stated she cramped all night and pt stated that she is not going to continue to take meds because the swelling is gone.. Pt husband stated that pt stop breathing at night and she needs to know what streps to take.Marland Kitchen  Please advise: [phone]

## 2016-08-22 NOTE — Telephone Encounter (Signed)
Testing

## 2016-08-22 NOTE — Telephone Encounter (Signed)
Pt stated that she has some swelling today but its not as bad as it was the other day. Advised patient to take a half pill of lasix and a second potassium pill later if she had cramping. Patient verbalized understanding. Patient was also advised that if she has any other questions or concerns to give [place] a call back.

## 2016-08-26 ENCOUNTER — Encounter: Payer: Self-pay | Admitting: Family Medicine

## 2016-09-03 ENCOUNTER — Ambulatory Visit: Payer: Medicare Other | Admitting: Psychiatry

## 2016-09-05 NOTE — Progress Notes (Signed)
Office Visit Note  Patient: Kayla [name]             Date of Birth: 1945-01-24           MRN: [id number]             PCP: Shawnee Knapp, MD Referring: Shawnee Knapp, MD Visit Date: 09/10/2016 Occupation: @GUAROCC @    Subjective:  Osteoporosis.   History of Present Illness: Kayla [name] is a 71 y.o. female history of osteoporosis, this disease. She has been taking vitamin D now for vitamin D deficiency. The plan is to start her on Tymlos after her vitamin D levels are normal. Patient states that she's been having increased fluid retention recently. She was taken off the gabapentin and Celebrex by her PCP about a month ago. She has not noticed any improvement in the fluid retention. She's also taking Lasix which is not helping. She continues to have some generalized pain from fibromyalgia syndrome. She also describes nocturnal pain and stiffness lasting all day.  Activities of Daily Living:  Patient reports morning stiffness for all day minutes.   Patient Reports nocturnal pain.  Difficulty dressing/grooming: Denies Difficulty climbing stairs: Reports Difficulty getting out of chair: Reports Difficulty using hands for taps, buttons, cutlery, and/or writing: Denies   Review of Systems  Constitutional: Positive for fatigue. Negative for night sweats, weight gain, weight loss and weakness.  HENT: Positive for mouth dryness. Negative for mouth sores, trouble swallowing, trouble swallowing and nose dryness.   Eyes: Negative for pain, redness, visual disturbance and dryness.  Respiratory: Negative for cough, shortness of breath and difficulty breathing.   Cardiovascular: Negative for chest pain, palpitations, irregular heartbeat and swelling in legs/feet.  Gastrointestinal: Negative for blood in stool, constipation and diarrhea.  Endocrine: Negative for increased urination.  Genitourinary: Negative for vaginal dryness.  Musculoskeletal: Positive for arthralgias, joint pain, myalgias,  morning stiffness and myalgias. Negative for joint swelling, muscle weakness and muscle tenderness.  Skin: Negative for color change, rash, hair loss, skin tightness, ulcers and sensitivity to sunlight.  Allergic/Immunologic: Negative for susceptible to infections.  Neurological: Negative for dizziness, memory loss and night sweats.  Hematological: Negative for swollen glands.  Psychiatric/Behavioral: Positive for sleep disturbance. Negative for depressed mood. The patient is nervous/anxious.     PMFS History:  Patient Active Problem List   Diagnosis Date Noted  . Peripheral vascular disease ([place]) 01/22/2016  . Hypertension 01/22/2016  . Prediabetes 01/22/2016  . H/O hyperparathyroidism 01/22/2016  . Chest pain 12/11/2015  . Mitral regurgitation 12/11/2015  . Vitamin D deficiency 03/08/2015  . H/O gastric bypass 02/11/2014  . Chronic maxillary sinusitis 09/15/2013  . Personal history of colonic polyps 07/22/2013  . HA (headache)-chronic/tension type 07/28/2012  . Osteoporosis 07/28/2012  . Unspecified vitamin D deficiency-2009 07/28/2012  . Nephrolithiasis 07/28/2012  . Fibromyalgia 07/26/2012  . Chronic interstitial cystitis 10/16/2011  . Chronic constipation 10/04/2010  . WEIGHT LOSS 11/27/2009  . PERSONAL HISTORY OF FAILED MODERATE SEDATION 06/01/2008  . Anxiety state 03/18/2007  . Reactive depression 03/18/2007  . GERD 03/18/2007  . Diaphragmatic hernia 03/18/2007  . Osteoarthritis 03/18/2007  . [place] DISEASE, CERVICAL 03/18/2007  . [place] DISEASE, LUMBAR 03/18/2007  . Myalgia and myositis 03/18/2007    Past Medical History:  Diagnosis Date  . Allergy   . Anemia    anemia in the past  . Angina    takes Propanolol prn and it stops her chest  . Anxiety   . Barrett esophagus  not consistently present  . Cataract   . Chronic kidney disease    kidney stones and UTI  . Complication of anesthesia    either  BP or pulse dropped with last surgery  . DDD (degenerative  disc disease)    cervical and lumbar  . Dementia   . Depression    post traumatic stress  disorder  . Diabetes mellitus    sugar goes up and down diet controlled  . Dysrhythmia    brought on by stress  . Fatigue   . Fibromyalgia    neuropathy knees ankles and toes, bladder  . GERD (gastroesophageal reflux disease)   . H/O hyperparathyroidism   . Headache   . Headache(784.0)    migraines  . Hiatal hernia   . History of kidney stones   . Hypertension    3 small brain aneurysms  . IBS (irritable bowel syndrome)   . Macular degeneration   . Migraines   . Osteoarthritis    all over  . Osteopetrosis   . Osteoporosis   . Peripheral vascular disease ([facility])    superficial phlebitis in left calf  . Personal history of colonic polyps 07/22/2013   07/2013 - 3 small adenomas - repeat colonoscopy 2018    Family History  Problem Relation Age of Onset  . Stroke Mother   . Lung cancer Mother   . Heart disease Mother   . Diabetes Mother   . Hypertension Father   . Stroke Father   . Lung cancer Father   . Heart disease Father   . Kidney disease Father   . Seizures Father        epilepsy, and sisters x 2  . Pancreatic cancer Sister   . Lung cancer Sister   . Colon cancer Maternal Aunt        12 relatives  . Uterine cancer Unknown        aunt  . Heart disease Unknown        grandmother  . Clotting disorder Sister   . Heart disease Sister        x 2  . Kidney disease Sister        x 2  . Breast cancer Sister   . Colon cancer Paternal Aunt   . Colon cancer Paternal Aunt    Past Surgical History:  Procedure Laterality Date  . ABDOMINAL HYSTERECTOMY    . APPENDECTOMY    . CARDIAC CATHETERIZATION  03/22/1991   EF 61%  . CARDIOVASCULAR STRESS TEST  10/03/2006  . CHOLECYSTECTOMY    . COLONOSCOPY  06/23/2008   normal  . DILATION AND CURETTAGE OF UTERUS     x2  . ESOPHAGUS SURGERY    . EXPLORATORY LAPAROTOMY  1993  . GASTRIC BYPASS  1999  . GASTRIC RESTRICTION SURGERY  1991    . Right Arm Surgery    . Right Knee Arthroscopy    . Rt wrist fx  2009  . stomach stappeling  1991  . STONE EXTRACTION WITH BASKET  2012  . TONSILLECTOMY    . TUBAL LIGATION    . UPPER GASTROINTESTINAL ENDOSCOPY  06/23/2008   w/Dil, Barrett's esophagus  . US ECHOCARDIOGRAPHY  01/21/2007   EF 55-60%  . US ECHOCARDIOGRAPHY  08/30/2003   EF 55-60%   Social History   Social History Narrative  . No narrative on file     Objective: Vital Signs: BP 128/62   Pulse 84   Resp 14   Ht  5\' 5"  (1.651 m)   Wt 112 lb (50.8 kg)   BMI 18.64 kg/m    Physical Exam  Constitutional: She is oriented to person, place, and time. She appears well-developed and well-nourished.  HENT:  Head: Normocephalic and atraumatic.  Eyes: Conjunctivae and EOM are normal.  Neck: Normal range of motion.  Cardiovascular: Normal rate, regular rhythm, normal heart sounds and intact distal pulses.   Pulmonary/Chest: Effort normal and breath sounds normal.  Abdominal: Soft. Bowel sounds are normal.  Lymphadenopathy:    She has no cervical adenopathy.  Neurological: She is alert and oriented to person, place, and time.  Skin: Skin is warm and dry. Capillary refill takes less than 2 seconds.  Psychiatric: She has a normal mood and affect. Her behavior is normal.  Nursing note and vitals reviewed.    Musculoskeletal Exam: C-spine and thoracic lumbar spine limited range of motion with the stiffness. Shoulder joints elbow joints wrist joint MCPs PIPs DIPs with good range of motion with no synovitis. Hip joints knee joints ankles MTPs PIPs DIPs were good range of motion with no synovitis. Although she had generalized hyperalgesia and positive tender points.  CDAI Exam: No CDAI exam completed.    Investigation: No additional findings.   Imaging: Dg Chest 2 View  Result Date: 08/19/2016 CLINICAL DATA:  Shortness of breath on exertion, angina, worsening pedal edema, presyncope. EXAM: CHEST  2 VIEW COMPARISON:   12/25/2013. FINDINGS: Trachea is midline. Heart size normal. Lungs are hyperinflated but clear. No pleural fluid. Postsurgical changes in the epigastric region. IMPRESSION: Hyperinflation without acute finding. Electronically Signed   By: Lorin Picket M.D.   On: 08/19/2016 15:15    Speciality Comments: No specialty comments available.    Procedures:  No procedures performed Allergies: Ambien [zolpidem tartrate]; Codeine; Oxycodone-acetaminophen; Tylox [oxycodone-acetaminophen]; Darvocet [propoxyphene n-acetaminophen]; Darvon [propoxyphene]; Food; Lorcet [hydrocodone-acetaminophen]; Opium; Vicodin [hydrocodone-acetaminophen]; Wheat bran; Zolpidem tartrate; Zolpidem tartrate; Amoxicillin; and Penicillins   Assessment / Plan:     Visit Diagnoses: Age-related osteoporosis without current pathological fracture - Tymlos consent on file, ready to start  after Vitamin D def is resolved 02/16/16 The BMD measured at [facility] Neck Left is 0.585 g/cm2 with a T-score of -3.3. High plan to start her on Tymlos after vitamin D levels available. We will check her vitamin D level today. We will contact her once the vitamin D results are back to start her on Tymlos.  Vitamin D deficiency - Plan: VITAMIN D 25 Hydroxy (Vit-D Deficiency, Fractures) the colon is to keep her vitamin D between 65 and 60.  H/O hyperparathyroidism - Plan: PTH, Intact and Calcium  Other fatigue - Plan: BASIC METABOLIC PANEL WITH GFR. She's been taking Lasix with potassium. She has no edema on examination today. She wanted to have her BMP checked we'll check that today.  H/O gastric bypass  DDD (degenerative disc disease), cervical: Chronic pain   DDD (degenerative disc disease), lumbar: Chronic pain she's been going to pain management.  History of depression  History of fibromyalgia - On Cymbalta, fentanyl  History of gastroesophageal reflux (GERD)    Orders: Orders Placed This Encounter  Procedures  . PTH, Intact and Calcium    . VITAMIN D 25 Hydroxy (Vit-D Deficiency, Fractures)  . BASIC METABOLIC PANEL WITH GFR   No orders of the defined types were placed in this encounter.     Follow-Up Instructions: Return in about 6 months (around 03/13/2017) for Osteoporosis,DDD, FMS.   Bo Merino, MD  Note - This  record has been created using [employer].  Chart creation errors have been sought, but may not always  have been located. Such creation errors do not reflect on  the standard of medical care.

## 2016-09-08 ENCOUNTER — Encounter: Payer: Self-pay | Admitting: Family Medicine

## 2016-09-10 ENCOUNTER — Ambulatory Visit (INDEPENDENT_AMBULATORY_CARE_PROVIDER_SITE_OTHER): Payer: Medicare Other | Admitting: Rheumatology

## 2016-09-10 ENCOUNTER — Encounter: Payer: Self-pay | Admitting: Rheumatology

## 2016-09-10 ENCOUNTER — Telehealth: Payer: Self-pay | Admitting: Radiology

## 2016-09-10 VITALS — BP 128/62 | HR 84 | Resp 14 | Ht 65.0 in | Wt 112.0 lb

## 2016-09-10 DIAGNOSIS — E559 Vitamin D deficiency, unspecified: Secondary | ICD-10-CM

## 2016-09-10 DIAGNOSIS — Z8719 Personal history of other diseases of the digestive system: Secondary | ICD-10-CM | POA: Diagnosis not present

## 2016-09-10 DIAGNOSIS — Z8739 Personal history of other diseases of the musculoskeletal system and connective tissue: Secondary | ICD-10-CM | POA: Diagnosis not present

## 2016-09-10 DIAGNOSIS — Z8659 Personal history of other mental and behavioral disorders: Secondary | ICD-10-CM

## 2016-09-10 DIAGNOSIS — M5136 Other intervertebral disc degeneration, lumbar region: Secondary | ICD-10-CM | POA: Diagnosis not present

## 2016-09-10 DIAGNOSIS — M5030 Other cervical disc degeneration, unspecified cervical region: Secondary | ICD-10-CM | POA: Diagnosis not present

## 2016-09-10 DIAGNOSIS — Z9884 Bariatric surgery status: Secondary | ICD-10-CM

## 2016-09-10 DIAGNOSIS — R5383 Other fatigue: Secondary | ICD-10-CM | POA: Diagnosis not present

## 2016-09-10 DIAGNOSIS — Z8639 Personal history of other endocrine, nutritional and metabolic disease: Secondary | ICD-10-CM | POA: Diagnosis not present

## 2016-09-10 DIAGNOSIS — M810 Age-related osteoporosis without current pathological fracture: Secondary | ICD-10-CM

## 2016-09-10 NOTE — Telephone Encounter (Signed)
Once Vitamin D is normal plan is to send in [facility] for patient.

## 2016-09-11 ENCOUNTER — Telehealth: Payer: Self-pay | Admitting: Cardiovascular Disease

## 2016-09-11 LAB — BASIC METABOLIC PANEL WITH GFR
BUN: 18 mg/dL (ref 7–25)
CO2: 26 mmol/L (ref 20–32)
Calcium: 9.5 mg/dL (ref 8.6–10.4)
Chloride: 104 mmol/L (ref 98–110)
Creat: 0.74 mg/dL (ref 0.50–0.99)
GFR, EST NON AFRICAN AMERICAN: 83 mL/min (ref >=60)
Glucose, Bld: 110 mg/dL — ABNORMAL HIGH (ref 65–99)
POTASSIUM: 4.8 mmol/L (ref 3.5–5.3)
Sodium: 141 mmol/L (ref 135–146)

## 2016-09-11 LAB — PTH, INTACT AND CALCIUM
CALCIUM: 9.5 mg/dL (ref 8.6–10.4)
PTH: 45 pg/mL (ref 14–64)

## 2016-09-11 LAB — VITAMIN D 25 HYDROXY (VIT D DEFICIENCY, FRACTURES): VIT D 25 HYDROXY: 44 ng/mL (ref 30–100)

## 2016-09-11 NOTE — Telephone Encounter (Signed)
Agree with note from Christen Bame, RN

## 2016-09-11 NOTE — Telephone Encounter (Signed)
New Message  Pt c/o swelling: STAT is pt has developed SOB within 24 hours  1. How long have you been experiencing swelling? Few weeks   2. Where is the swelling located? Feet and ankles   3.  Are you currently taking a "fluid pill"?   4.  Are you currently SOB? no  5.  Have you traveled recently? no

## 2016-09-11 NOTE — Telephone Encounter (Signed)
Sticky note made in patient's chart

## 2016-09-11 NOTE — Progress Notes (Signed)
Ok to start [employer]

## 2016-09-11 NOTE — Telephone Encounter (Signed)
Add epic problem during previous note, please disregard Spoke with patient who states she has had lower extremity swelling over the past few weeks. She states she saw Dr. Brigitte Pulse, her PCP, approximately 6 weeks ago and had a lot of tests done and no abnormalities showed up. She was prescribed 40 mg lasix daily as needed for swelling and she has been taking this on occasion, but has not tolerated the full 40 mg. She states her vascular u/s of her lower extremities was normal. She reports having some chest discomfort which is relieved by propanolol 5 mg (she breaks the 10 mg tablet in half). Reports BP @ 0600 today was 95/50 mmHg and pulse was 60 bpm. She states she is also having problems with her glucose level as well. She admits to eating a high salt diet and states she is working to reduce it. She states she drinks water all day. I advised her to elevate her legs and to try to take the lasix 40 mg daily for a few days to see if this helps. Last EF on echo last year was 55-60%. She states she has an appointment in 1 week with Dr. Acie Fredrickson and is requesting additional advice. She states appointment can be cancelled if he just wants her to work on the things listed above. I advised her I will call back with his advice. She verbalized understanding and agreement and thanked me for the call.

## 2016-09-12 ENCOUNTER — Encounter (HOSPITAL_COMMUNITY): Payer: Self-pay | Admitting: Emergency Medicine

## 2016-09-12 ENCOUNTER — Emergency Department (HOSPITAL_COMMUNITY)
Admission: EM | Admit: 2016-09-12 | Discharge: 2016-09-12 | Disposition: A | Discharge status: Home / Self Care | Payer: Medicare Other | Attending: Emergency Medicine | Admitting: Emergency Medicine

## 2016-09-12 ENCOUNTER — Other Ambulatory Visit: Payer: Self-pay

## 2016-09-12 ENCOUNTER — Emergency Department (HOSPITAL_COMMUNITY): Payer: Medicare Other

## 2016-09-12 DIAGNOSIS — R072 Precordial pain: Secondary | ICD-10-CM

## 2016-09-12 DIAGNOSIS — M79661 Pain in right lower leg: Secondary | ICD-10-CM | POA: Diagnosis not present

## 2016-09-12 DIAGNOSIS — N189 Chronic kidney disease, unspecified: Secondary | ICD-10-CM | POA: Diagnosis not present

## 2016-09-12 DIAGNOSIS — Z79899 Other long term (current) drug therapy: Secondary | ICD-10-CM | POA: Diagnosis not present

## 2016-09-12 DIAGNOSIS — R202 Paresthesia of skin: Secondary | ICD-10-CM

## 2016-09-12 DIAGNOSIS — Z7982 Long term (current) use of aspirin: Secondary | ICD-10-CM | POA: Diagnosis not present

## 2016-09-12 DIAGNOSIS — M79662 Pain in left lower leg: Secondary | ICD-10-CM | POA: Insufficient documentation

## 2016-09-12 DIAGNOSIS — I129 Hypertensive chronic kidney disease with stage 1 through stage 4 chronic kidney disease, or unspecified chronic kidney disease: Secondary | ICD-10-CM | POA: Insufficient documentation

## 2016-09-12 DIAGNOSIS — E119 Type 2 diabetes mellitus without complications: Secondary | ICD-10-CM | POA: Diagnosis not present

## 2016-09-12 DIAGNOSIS — R079 Chest pain, unspecified: Secondary | ICD-10-CM | POA: Diagnosis not present

## 2016-09-12 LAB — BASIC METABOLIC PANEL
ANION GAP: 11 (ref 5–15)
BUN: 26 mg/dL — AB (ref 6–20)
CHLORIDE: 97 mmol/L — AB (ref 101–111)
CO2: 31 mmol/L (ref 22–32)
Calcium: 10.2 mg/dL (ref 8.9–10.3)
Creatinine, Ser: 0.7 mg/dL (ref 0.44–1.00)
GFR calc Af Amer: >60 mL/min (ref >60)
GFR calc non Af Amer: >60 mL/min (ref >60)
GLUCOSE: 101 mg/dL — AB (ref 65–99)
POTASSIUM: 4.8 mmol/L (ref 3.5–5.1)
Sodium: 139 mmol/L (ref 135–145)

## 2016-09-12 LAB — I-STAT TROPONIN, ED
TROPONIN I, POC: 0 ng/mL (ref 0.00–0.08)
Troponin i, poc: 0.01 ng/mL (ref 0.00–0.08)

## 2016-09-12 LAB — CBC
HEMATOCRIT: 41.8 % (ref 36.0–46.0)
HEMOGLOBIN: 13.6 g/dL (ref 12.0–15.0)
MCH: 29.6 pg (ref 26.0–34.0)
MCHC: 32.5 g/dL (ref 30.0–36.0)
MCV: 90.9 fL (ref 78.0–100.0)
Platelets: 231 10*3/uL (ref 150–400)
RBC: 4.6 MIL/uL (ref 3.87–5.11)
RDW: 13.6 % (ref 11.5–15.5)
WBC: 8.7 10*3/uL (ref 4.0–10.5)

## 2016-09-12 LAB — BRAIN NATRIURETIC PEPTIDE: B NATRIURETIC PEPTIDE 5: 22.6 pg/mL (ref 0.0–100.0)

## 2016-09-12 NOTE — ED Triage Notes (Signed)
Pt c/o chest pain that began 1.5 hours PTA. Pt c/o tingling to bilateral hands and feet, hx fluid on her legs, taking lasix and potassium, states fluid has increased, denies shortness of breath, nausea, diaphoresis. C/o headache, states she has prescription for propanolol that she can take for angina, but did not do so today.

## 2016-09-12 NOTE — ED Notes (Signed)
Pt states she took "half a propranalol" (5mg ) after getting the EKG here, and her angina resolved. Chief c/c is now pins & needles pain and swelling to bilat LEs. Pt calm and in NAD.

## 2016-09-12 NOTE — Discharge Instructions (Signed)

## 2016-09-12 NOTE — ED Provider Notes (Signed)
Emergency Department Provider Note   I have reviewed the triage vital signs and the nursing notes.   HISTORY  Chief Complaint Chest Pain and Leg Pain   HPI Kayla [name] is a 71 y.o. female with PMH of CKD, Angina, DM, GERD, presents to the emergency department for evaluation of chest discomfort with associated tingling in bilateral lower extremities. She describes some associated calf pain that is intermittently very severe. Patient took a Propranolol for her CP and symptoms resolved. She continues to have intermittent sharp pain in the legs and feet. No unilateral swelling. No fever or chills. No active CP or SOB. Occasionally leg pain will radiate to the hands.   Past Medical History:  Diagnosis Date  . Allergy   . Anemia    anemia in the past  . Angina    takes Propanolol prn and it stops her chest  . Anxiety   . Barrett esophagus    not consistently present  . Cataract   . Chronic kidney disease    kidney stones and UTI  . Complication of anesthesia    either  BP or pulse dropped with last surgery  . DDD (degenerative disc disease)    cervical and lumbar  . Dementia   . Depression    post traumatic stress  disorder  . Diabetes mellitus    sugar goes up and down diet controlled  . Dysrhythmia    brought on by stress  . Fatigue   . Fibromyalgia    neuropathy knees ankles and toes, bladder  . GERD (gastroesophageal reflux disease)   . H/O hyperparathyroidism   . Headache   . Headache(784.0)    migraines  . Hiatal hernia   . History of kidney stones   . Hypertension    3 small brain aneurysms  . IBS (irritable bowel syndrome)   . Macular degeneration   . Migraines   . Osteoarthritis    all over  . Osteopetrosis   . Osteoporosis   . Peripheral vascular disease (HCC)    superficial phlebitis in left calf  . Personal history of colonic polyps 07/22/2013   07/2013 - 3 small adenomas - repeat colonoscopy 2018    Patient Active Problem List   Diagnosis  Date Noted  . Peripheral vascular disease ([place]) 01/22/2016  . Hypertension 01/22/2016  . Prediabetes 01/22/2016  . H/O hyperparathyroidism 01/22/2016  . Chest pain 12/11/2015  . Mitral regurgitation 12/11/2015  . Vitamin D deficiency 03/08/2015  . H/O gastric bypass 02/11/2014  . Chronic maxillary sinusitis 09/15/2013  . Personal history of colonic polyps 07/22/2013  . HA (headache)-chronic/tension type 07/28/2012  . Osteoporosis 07/28/2012  . Unspecified vitamin D deficiency-2009 07/28/2012  . Nephrolithiasis 07/28/2012  . Fibromyalgia 07/26/2012  . Chronic interstitial cystitis 10/16/2011  . Chronic constipation 10/04/2010  . WEIGHT LOSS 11/27/2009  . PERSONAL HISTORY OF FAILED MODERATE SEDATION 06/01/2008  . Anxiety state 03/18/2007  . Reactive depression 03/18/2007  . GERD 03/18/2007  . Diaphragmatic hernia 03/18/2007  . Osteoarthritis 03/18/2007  . [place] DISEASE, CERVICAL 03/18/2007  . [place] DISEASE, LUMBAR 03/18/2007  . Myalgia and myositis 03/18/2007    Past Surgical History:  Procedure Laterality Date  . ABDOMINAL HYSTERECTOMY    . APPENDECTOMY    . CARDIAC CATHETERIZATION  03/22/1991   EF 61%  . CARDIOVASCULAR STRESS TEST  10/03/2006  . CHOLECYSTECTOMY    . COLONOSCOPY  06/23/2008   normal  . DILATION AND CURETTAGE OF UTERUS     x2  .  ESOPHAGUS SURGERY    . EXPLORATORY LAPAROTOMY  1993  . GASTRIC BYPASS  1999  . GASTRIC RESTRICTION SURGERY  1991  . Right Arm Surgery    . Right Knee Arthroscopy    . Rt wrist fx  2009  . stomach stappeling  1991  . STONE EXTRACTION WITH BASKET  2012  . TONSILLECTOMY    . TUBAL LIGATION    . UPPER GASTROINTESTINAL ENDOSCOPY  06/23/2008   w/Dil, Barrett's esophagus  . US ECHOCARDIOGRAPHY  01/21/2007   EF 55-60%  . US ECHOCARDIOGRAPHY  08/30/2003   EF 55-60%    Current Outpatient Rx  . Order #: [id number] Class: Historical Med  . Order #: [id number] Class: Historical Med  . Order #: [id number] Class: Historical Med  . Order #:  [id number] Class: Historical Med  . Order #: [id number] Class: Historical Med  . Order #: [id number] Class: Print  . Order #: [id number] Class: Historical Med  . Order #: [id number] Class: Historical Med  . Order #: [id number] Class: Normal  . Order #: [id number] Class: Normal  . Order #: [id number] Class: Print  . Order #: [id number] Class: Historical Med  . Order #: [id number] Class: Normal  . Order #: [id number] Class: Historical Med  . Order #: [id number] Class: Historical Med  . Order #: [id number] Class: Normal  . Order #: [id number] Class: Historical Med  . Order #: [id number] Class: Normal  . Order #: [id number] Class: Historical Med  . Order #: [id number] Class: Historical Med  . Order #: [id number] Class: Normal  . Order #: [id number] Class: Historical Med  . Order #: [id number] Class: Normal  . Order #: [id number] Class: Print  . Order #: [id number] Class: Normal  . Order #: [id number] Class: Normal  . Order #: [id number] Class: Historical Med  . Order #: [id number] Class: Print  . Order #: [id number] Class: Normal  . Order #: [id number] Class: Normal  . Order #: [id number] Class: Normal  . Order #: [id number] Class: Normal  . Order #: [id number] Class: Normal  . Order #: [id number] Class: Normal    Allergies Ambien [zolpidem tartrate]; Codeine; Oxycodone-acetaminophen; Tylox [oxycodone-acetaminophen]; Darvocet [propoxyphene n-acetaminophen]; Darvon [propoxyphene]; Food; Lorcet [hydrocodone-acetaminophen]; Opium; Vicodin [hydrocodone-acetaminophen]; Wheat bran; Zolpidem tartrate; Zolpidem tartrate; Amoxicillin; and Penicillins  Family History  Problem Relation Age of Onset  . Stroke Mother   . Lung cancer Mother   . Heart disease Mother   . Diabetes Mother   . Hypertension Father   . Stroke Father   . Lung cancer Father   . Heart disease Father   . Kidney disease Father   . Seizures Father        epilepsy, and sisters x 2  . Pancreatic cancer Sister   . Lung cancer Sister   . Colon cancer Maternal Aunt        12  relatives  . Uterine cancer Unknown        aunt  . Heart disease Unknown        grandmother  . Clotting disorder Sister   . Heart disease Sister        x 2  . Kidney disease Sister        x 2  . Breast cancer Sister   . Colon cancer Paternal Aunt   . Colon cancer Paternal Aunt     Social History Social History  Substance Use Topics  . Smoking status: Never Smoker  . Smokeless tobacco: Never Used  . Alcohol use No    Review of Systems  Constitutional: No fever/chills Eyes: No visual changes. ENT: No sore throat. Cardiovascular: Positive chest pain. Respiratory: Denies shortness of breath. Gastrointestinal: No abdominal pain.  No nausea, no vomiting.  No diarrhea.  No constipation. Genitourinary: Negative for dysuria. Musculoskeletal: Negative for back pain. Positive bilateral LE pain.  Skin: Negative for rash. Neurological: Negative for headaches, focal weakness or numbness.   10-point ROS otherwise negative.  ____________________________________________   PHYSICAL EXAM:  VITAL SIGNS: ED Triage Vitals  Enc Vitals Group     BP 09/12/16 1446 (!) 117/56     Pulse Rate 09/12/16 1446 (!) 118     Resp 09/12/16 1446 18     Temp 09/12/16 1446 98.7 F (37.1 C)     Temp Source 09/12/16 1446 Oral     SpO2 09/12/16 1446 97 %     Weight 09/12/16 1515 111 lb (50.3 kg)     Height 09/12/16 1515 5\' 5"  (1.651 m)     Pain Score 09/12/16 1515 5   Constitutional: Alert and oriented. Well appearing and in no acute distress. Eyes: Conjunctivae are normal. Head: Atraumatic. Nose: No congestion/rhinnorhea. Mouth/Throat: Mucous membranes are moist.  Oropharynx non-erythematous. Neck: No stridor.   Cardiovascular: Normal rate, regular rhythm. Good peripheral circulation. Grossly normal heart sounds.   Respiratory: Normal respiratory effort.  No retractions. Lungs CTAB. Gastrointestinal: Soft and nontender. No distention.  Musculoskeletal: No lower extremity tenderness nor edema.  No gross deformities of extremities. Neurologic:  Normal speech and language. No gross focal neurologic deficits are appreciated.  Skin:  Skin is warm, dry and intact. No rash noted.  ____________________________________________   LABS (all labs ordered are listed, but only abnormal results are displayed)  Labs Reviewed  BASIC METABOLIC PANEL - Abnormal; Notable for the following:       Result Value   Chloride 97 (*)    Glucose, Bld 101 (*)    BUN 26 (*)    All other components within normal limits  CBC  BRAIN NATRIURETIC PEPTIDE  I-STAT TROPONIN, ED  I-STAT TROPONIN, ED   ____________________________________________  EKG   EKG Interpretation  Date/Time:  Thursday September 12 2016 14:55:35 EDT Ventricular Rate:  101 PR Interval:  148 QRS Duration: 72 QT Interval:  330 QTC Calculation: 427 R Axis:   51 Text Interpretation:  Sinus tachycardia Possible Left atrial enlargement Borderline ECG No STEMI.  Confirmed by Nanda Quinton [phone]) on 09/12/2016 7:23:55 PM       ____________________________________________  RADIOLOGY  Dg Chest 2 View  Result Date: 09/12/2016 CLINICAL DATA:  Left-sided chest pain. EXAM: CHEST  2 VIEW COMPARISON:  08/19/2016 FINDINGS: Cardiomediastinal silhouette is normal. Mediastinal contours appear intact. Tortuosity of the aorta. There is no evidence of focal airspace consolidation, pleural effusion or pneumothorax. Mild hyperinflation of the lungs with suspected upper lobe predominant emphysematous changes. Osseous structures are without acute abnormality. Soft tissues are grossly normal. Postsurgical changes in the left upper abdomen. IMPRESSION: Mild hyperinflation of the lungs with suspected upper lobe predominant emphysematous changes. Electronically Signed   By: Fidela Salisbury M.D.   On: 09/12/2016 16:28    ____________________________________________   PROCEDURES  Procedure(s) performed:    Procedures  None ____________________________________________   INITIAL IMPRESSION / ASSESSMENT AND PLAN / ED COURSE  Pertinent labs & imaging results that were available during my care of the patient were reviewed by me and considered in my medical decision making (see chart for details).  Patient presents to the ED for evaluation of chest pain and atypical LE pain. No evidence of DVT on exam. Plan for serial troponin and reassess. No active pain. Patient with similar pain in the past. Followed  by Cardiology.   10:01 PM Repeat troponin negative. BNP normal. Unclear course of migratory sharp pain and tingling. No active CP now. Plan for discharge with Cardiology follow up.   At this time, I do not feel there is any life-threatening condition present. I have reviewed and discussed all results (EKG, imaging, lab, urine as appropriate), exam findings with patient. I have reviewed nursing notes and appropriate previous records.  I feel the patient is safe to be discharged home without further emergent workup. Discussed usual and customary return precautions. Patient and family (if present) verbalize understanding and are comfortable with this plan.  Patient will follow-up with their primary care provider. If they do not have a primary care provider, information for follow-up has been provided to them. All questions have been answered.  ____________________________________________  FINAL CLINICAL IMPRESSION(S) / ED DIAGNOSES  Final diagnoses:  Precordial chest pain  Paresthesias     MEDICATIONS GIVEN DURING THIS VISIT:  None  NEW OUTPATIENT MEDICATIONS STARTED DURING THIS VISIT:  None   Note:  This document was prepared using Dragon voice recognition software and may include unintentional dictation errors.  Nanda Quinton, MD Emergency Medicine    Rotha Cassels, [facility], MD 09/12/16 [phone]

## 2016-09-16 ENCOUNTER — Other Ambulatory Visit: Payer: Self-pay | Admitting: Family Medicine

## 2016-09-17 ENCOUNTER — Ambulatory Visit (INDEPENDENT_AMBULATORY_CARE_PROVIDER_SITE_OTHER): Payer: Medicare Other | Admitting: Psychiatry

## 2016-09-17 DIAGNOSIS — F331 Major depressive disorder, recurrent, moderate: Secondary | ICD-10-CM

## 2016-09-19 ENCOUNTER — Ambulatory Visit: Payer: Medicare Other | Admitting: Family Medicine

## 2016-09-19 NOTE — Progress Notes (Signed)
Cardiology Office Note    Date:  09/20/2016   ID:  Kayla [name], DOB 08-17-1945, MRN [id number]  PCP:  Shawnee Knapp, MD  Cardiologist: Dr. Acie Fredrickson  Chief Complaint: Chest pain and LE pain   History of Present Illness:   Kayla [name] is a 71 y.o. female with hx of mitral valve prolapse, MR, HTN, brain aneurism, fibromyalgia, OA  and s/p Roux-en-Y surgery  for Barretts esophagus presents for evaluation of chest pain and LE pain/edema.   She had a normal heart catheterization in 1993. Stress Myoview study in 2008 was normal. Prior palpitation improved on propranolol.  Last echo 07/2014 showed LVEF of 55-60%, no wm abnormality. Last seen by Dr. Acie Fredrickson 11/2015.  Recently dealing with calf and ankle pain. Seen by PCP 08/19/16. Trial of PRN lasix.  No evidence of PAD on  ABI 08/21/16. Seen in ER 09/12/16 for chest pain and leg pain. Work up reassuring (personally reviewed) and send home.   Here today for follow up. The patient is under a lot of stress taking care of her ill husband and 4 grandchildren. Patient has a intermittent episode of palpitation along with chest pain described as angina. Mostly during stressful situation. Relieves with when necessary propranolol 5 mg. She has diffuse intermittent bilateral lower extremity pain. Workup negative as above. Seen by her rheumatologist and discontinued Lasix. She denies orthopnea, PND, syncope, melena or blood in her stool or urine.  Past Medical History:  Diagnosis Date  . Allergy   . Anemia    anemia in the past  . Angina    takes Propanolol prn and it stops her chest  . Anxiety   . Barrett esophagus    not consistently present  . Cataract   . Chronic kidney disease    kidney stones and UTI  . Complication of anesthesia    either  BP or pulse dropped with last surgery  . DDD (degenerative disc disease)    cervical and lumbar  . Dementia   . Depression    post traumatic stress  disorder  . Diabetes mellitus    sugar goes up and  down diet controlled  . Dysrhythmia    brought on by stress  . External hemorrhoids   . Fatigue   . Fibromyalgia    neuropathy knees ankles and toes, bladder  . GERD (gastroesophageal reflux disease)   . H/O hyperparathyroidism   . Headache   . Headache(784.0)    migraines  . Hiatal hernia   . History of kidney stones   . Hypertension    3 small brain aneurysms  . IBS (irritable bowel syndrome)   . Internal hemorrhoids   . Macular degeneration   . Migraines   . Osteoarthritis    all over  . Osteopetrosis   . Osteoporosis   . Peripheral vascular disease (HCC)    superficial phlebitis in left calf  . Personal history of colonic polyps 07/22/2013   07/2013 - 3 small adenomas - repeat colonoscopy 2018    Past Surgical History:  Procedure Laterality Date  . ABDOMINAL HYSTERECTOMY    . APPENDECTOMY    . CARDIAC CATHETERIZATION  03/22/1991   EF 61%  . CARDIOVASCULAR STRESS TEST  10/03/2006  . CHOLECYSTECTOMY    . COLONOSCOPY  06/23/2008   normal  . DILATION AND CURETTAGE OF UTERUS     x2  . ESOPHAGUS SURGERY    . EXPLORATORY LAPAROTOMY  1993  . GASTRIC BYPASS  1999  .  GASTRIC RESTRICTION SURGERY  1991  . Right Arm Surgery    . Right Knee Arthroscopy    . Rt wrist fx  2009  . stomach stappeling  1991  . STONE EXTRACTION WITH BASKET  2012  . TONSILLECTOMY    . TUBAL LIGATION    . UPPER GASTROINTESTINAL ENDOSCOPY  06/23/2008   w/Dil, Barrett's esophagus  . US ECHOCARDIOGRAPHY  01/21/2007   EF 55-60%  . US ECHOCARDIOGRAPHY  08/30/2003   EF 55-60%    Current Medications:  Prior to Admission medications   Medication Sig Start Date End Date Taking? Authorizing Provider  Ascorbic Acid (VITAMIN C) 1000 MG tablet Take 1,000 mg by mouth daily.    [provider]  aspirin-acetaminophen-caffeine (EXCEDRIN MIGRAINE) [phone] MG tablet Take 2 tablets by mouth 2 (two) times daily.    [provider]  beta carotene w/minerals (OCUVITE) tablet Take 1 tablet by mouth  2 (two) times daily.    [provider]  CALCIUM-MAGNESIUM-ZINC PO Take by mouth daily.    [provider]  Cholecalciferol (VITAMIN D3) 2000 UNITS TABS Take 2,000 Units by mouth daily.    [provider]  clonazePAM (KLONOPIN) 1 MG tablet Take 0.5-1 tablets (0.5-1 mg total) by mouth 2 (two) times daily as needed. anxiety/panic attacks. May fill on or after March 2018. This is a 3 month supply 02/26/16   Shawnee Knapp, MD  Cyanocobalamin (VITAMIN B-12) 1000 MCG SUBL Place 5,000 mcg under the tongue daily.     [provider]  cyclobenzaprine (FLEXERIL) 10 MG tablet Take 1 tablet (10 mg total) by mouth 3 (three) times daily as needed for muscle spasms. Patient not taking: Reported on 09/12/2016 02/26/16   Shawnee Knapp, MD  cycloSPORINE (RESTASIS) 0.05 % ophthalmic emulsion 2 (two) times daily. 07/21/13   [provider]  diclofenac sodium (VOLTAREN) 1 % GEL Apply 2 g topically 4 (four) times daily. Patient taking differently: Apply 2 g topically as needed (pain).  02/26/16   Shawnee Knapp, MD  dicyclomine (BENTYL) 10 MG capsule Take 1-2 tab 3 times daily AC as needed for spasms and cramping. 02/26/16   Shawnee Knapp, MD  diltiazem 2 % GEL Apply 1 application topically 2 (two) times daily. Patient taking differently: Apply 1 application topically as needed (pain).  02/26/16   Shawnee Knapp, MD  docusate sodium (COLACE) 250 MG capsule Take 250 mg by mouth daily.    [provider]  esomeprazole (NEXIUM) 40 MG capsule Take 1 capsule (40 mg total) by mouth daily before breakfast. 02/26/16   Shawnee Knapp, MD  fentaNYL (DURAGESIC - DOSED MCG/HR) 12 MCG/HR Place 12.5 mcg onto the skin every 3 (three) days.    [provider]  fentaNYL (DURAGESIC - DOSED MCG/HR) 50 MCG/HR Place 50 mcg onto the skin every 3 (three) days.    [provider]  hypromellose (SYSTANE OVERNIGHT THERAPY) 0.3 % GEL ophthalmic ointment Place 1 drop into both eyes at bedtime.  Reported on 02/11/2015    [provider]  lidocaine (LIDODERM) 5 % Place 1 patch onto the skin as needed. Remove & Discard patch within 12 hours. May dispense as 3 month supply 02/26/16   Shawnee Knapp, MD  Lifitegrast Shirley [place]ar) 5 % SOLN Place 1 drop into both eyes daily.     [provider]  Loteprednol Etabonate (LOTEMAX) 0.5 % GEL Place 1 drop into both eyes 2 (two) times daily. Reported on 02/11/2015 07/21/13  [provider]  mirabegron ER (MYRBETRIQ) 25 MG TB24 tablet Take 1 tablet (25 mg total) by mouth daily. 02/26/16   Shawnee Knapp, MD  Naloxone HCl 0.4 MG/0.4ML SOAJ Administer 0.4mg  [place] at first sign of opioid overdose and repeat every 2 minutes as needed for resuscitation. Call 911 immediately Patient not taking: Reported on 09/10/2016 03/20/16   Shawnee Knapp, MD  Omega-3 Fatty Acids (SEA-OMEGA PO) Take 1,000 mg by mouth daily.    [provider]  ondansetron (ZOFRAN) 8 MG tablet Take 1 tablet (8 mg total) by mouth every 8 (eight) hours as needed for nausea or vomiting. 06/27/16   Forrest Moron, MD  pyridoxine (B-6) 200 MG tablet Take 200 mg by mouth daily.    [provider]  SUMAtriptan (IMITREX) 100 MG tablet Take 1 tablet at earliest onset of headache, may repeat in 2 hours if headache persists or reoccurs. Patient taking differently: Take 100 mg by mouth every 3 (three) days.  02/26/16   Shawnee Knapp, MD  traZODone (DESYREL) 100 MG tablet Take 0.5-1 tablets (50-100 mg total) by mouth at bedtime as needed for sleep. And 1/2 -1 if wakes at 4am. Patient taking differently: Take 25 mg by mouth at bedtime.  02/26/16   Shawnee Knapp, MD  Vitamin D, Ergocalciferol, (DRISDOL) 50000 units CAPS capsule Take 1 capsule (50,000 Units total) by mouth every 7 (seven) days. 07/22/16   Bo Merino, MD  Allergies:   Ambien [zolpidem tartrate]; Codeine; Oxycodone-acetaminophen; Tylox [oxycodone-acetaminophen]; Darvocet [propoxyphene n-acetaminophen]; Darvon  [propoxyphene]; Food; Lorcet [hydrocodone-acetaminophen]; Opium; Vicodin [hydrocodone-acetaminophen]; Wheat bran; Zolpidem tartrate; Zolpidem tartrate; Amoxicillin; and Penicillins   Social History   Social History  . Marital status: Married    Spouse name: Rushie Chestnut.  . Number of children: 2  . Years of education: N/A   Occupational History  . Retired   .  Retired   Social History Main Topics  . Smoking status: Never Smoker  . Smokeless tobacco: Never Used  . Alcohol use No  . Drug use: No  . Sexual activity: Not Currently   Other Topics Concern  . None   Social History Narrative  . None     Family History:  The patient's family history includes Breast cancer in her sister; Clotting disorder in her sister; Colon cancer in her maternal aunt, paternal aunt, and paternal aunt; Diabetes in her mother; Heart disease in her father, mother, sister, and unknown relative; Hypertension in her father; Kidney disease in her father and sister; Lung cancer in her father, mother, and sister; Pancreatic cancer in her sister; Seizures in her father; Stroke in her father and mother; Uterine cancer in her unknown relative.   ROS:   Please see the history of present illness.    ROS All other systems reviewed and are negative.   PHYSICAL EXAM:   VS:  BP 100/60   Pulse 76   Ht 5\' 5"  (1.651 m)   Wt 110 lb 12.8 oz (50.3 kg)   BMI 18.44 kg/m    GEN: Well nourished, well developed, in no acute distress  HEENT: normal  Neck: no JVD, carotid bruits, or masses Cardiac: RRR; no murmurs, rubs, or gallops,no edema  Respiratory:  clear to auscultation bilaterally, normal work of breathing GI: soft, nontender, nondistended, + BS MS: no deformity or atrophy  Skin: warm and dry, no rash Neuro:  Alert and Oriented x 3, Strength and sensation are intact Psych: euthymic mood, full affect  Wt  Readings from Last 3 Encounters:  09/20/16 110 lb 12.8 oz (50.3 kg)  09/12/16 111 lb (50.3 kg)  09/10/16 112  lb (50.8 kg)      Studies/Labs Reviewed:   EKG:  EKG is not  ordered today.    Recent Labs: 07/01/2016: TSH 2.430 07/12/2016: Magnesium 2.1 08/19/2016: ALT 27 09/12/2016: B Natriuretic Peptide 22.6; BUN 26; Creatinine, Ser 0.70; Hemoglobin 13.6; Platelets 231; Potassium 4.8; Sodium 139   Lipid Panel    Component Value Date/Time   CHOL 221 (H) 01/22/2016 1425   TRIG 88 01/22/2016 1425   HDL 95 01/22/2016 1425   CHOLHDL 2.3 01/22/2016 1425   LDLCALC 108 (H) 01/22/2016 1425    Additional studies/ records that were reviewed today include:   As above   ASSESSMENT & PLAN:    1. Chest pain - Associated with the palpitation. No exertional chest tightness or dyspnea. She had a normal cath and Myoview to be asleep. Reassurance given. If worsening symptoms she will give [place] a call.  2. Palpitation - Related to anxiety and stress. Situation. She does not want to take Klonopin. Trial of low-dose propranolol 2.5 mg daily. She will discuss with her PCP tomorrow during follow-up for other option of anxiety.     Medication Adjustments/Labs and Tests Ordered: Current medicines are reviewed at length with the patient today.  Concerns regarding medicines are outlined above.  Medication changes, Labs and Tests ordered today are listed in the Patient Instructions below. Patient Instructions  Your physician has recommended you make the following change in your medication: DECREASE PROPANOLOL TO 2.5 MG DAILY  Your physician recommends that you schedule a follow-up appointment in: [place], [place]  09/20/2016 12:32 PM    [address], [place], [place]  [address] Phone: [phone]; Fax: [phone]

## 2016-09-20 ENCOUNTER — Telehealth: Payer: Self-pay

## 2016-09-20 ENCOUNTER — Encounter: Payer: Self-pay | Admitting: Physician Assistant

## 2016-09-20 ENCOUNTER — Ambulatory Visit (INDEPENDENT_AMBULATORY_CARE_PROVIDER_SITE_OTHER): Payer: Medicare Other | Admitting: Physician Assistant

## 2016-09-20 VITALS — BP 100/60 | HR 76 | Ht 65.0 in | Wt 110.8 lb

## 2016-09-20 DIAGNOSIS — R0789 Other chest pain: Secondary | ICD-10-CM | POA: Diagnosis not present

## 2016-09-20 DIAGNOSIS — R002 Palpitations: Secondary | ICD-10-CM

## 2016-09-20 MED ORDER — PROPRANOLOL HCL 10 MG PO TABS: 2.5000 mg | ORAL_TABLET | Freq: Every day | ORAL | Status: DC

## 2016-09-20 NOTE — Patient Instructions (Signed)
Your physician has recommended you make the following change in your medication: DECREASE PROPANOLOL TO 2.5 MG DAILY  Your physician recommends that you schedule a follow-up appointment in: [place] Acie Fredrickson

## 2016-09-20 NOTE — Telephone Encounter (Signed)
Called patient and advised that we received her results from her imaging and that the blood flow in your legs is normal and that everything else looked great. No other questions at this. Pt has apt with Brigitte Pulse on 09/21/16

## 2016-09-21 ENCOUNTER — Encounter: Payer: Self-pay | Admitting: Family Medicine

## 2016-09-21 ENCOUNTER — Ambulatory Visit (INDEPENDENT_AMBULATORY_CARE_PROVIDER_SITE_OTHER): Payer: Medicare Other | Admitting: Family Medicine

## 2016-09-21 VITALS — BP 96/56 | HR 78 | Temp 98.5°F | Resp 16 | Ht 65.0 in | Wt 111.2 lb

## 2016-09-21 DIAGNOSIS — G629 Polyneuropathy, unspecified: Secondary | ICD-10-CM

## 2016-09-21 DIAGNOSIS — R7309 Other abnormal glucose: Secondary | ICD-10-CM | POA: Diagnosis not present

## 2016-09-21 DIAGNOSIS — N898 Other specified noninflammatory disorders of vagina: Secondary | ICD-10-CM | POA: Diagnosis not present

## 2016-09-21 DIAGNOSIS — E161 Other hypoglycemia: Secondary | ICD-10-CM

## 2016-09-21 DIAGNOSIS — M2550 Pain in unspecified joint: Secondary | ICD-10-CM

## 2016-09-21 DIAGNOSIS — E538 Deficiency of other specified B group vitamins: Secondary | ICD-10-CM

## 2016-09-21 DIAGNOSIS — E162 Hypoglycemia, unspecified: Secondary | ICD-10-CM

## 2016-09-21 LAB — POC MICROSCOPIC URINALYSIS (UMFC)

## 2016-09-21 LAB — POCT URINALYSIS DIP (MANUAL ENTRY)
BILIRUBIN UA: NEGATIVE
Blood, UA: NEGATIVE
GLUCOSE UA: NEGATIVE mg/dL
Leukocytes, UA: NEGATIVE
NITRITE UA: NEGATIVE
Protein Ur, POC: NEGATIVE mg/dL
Spec Grav, UA: 1.025 (ref 1.010–1.025)
UROBILINOGEN UA: 1 U/dL
pH, UA: 6 (ref 5.0–8.0)

## 2016-09-21 LAB — POCT WET + KOH PREP
Trich by wet prep: ABSENT
YEAST BY KOH: ABSENT
Yeast by wet prep: ABSENT

## 2016-09-21 MED ORDER — CYANOCOBALAMIN 1000 MCG/ML IJ SOLN
1000.0000 ug | INTRAMUSCULAR | Status: DC
  Administered 2016-09-21: 1000 ug via INTRAMUSCULAR

## 2016-09-21 MED ORDER — GABAPENTIN 300 MG PO CAPS: 300.0000 mg | ORAL_CAPSULE | Freq: Two times a day (BID) | ORAL | 0 refills | Status: DC

## 2016-09-21 NOTE — Progress Notes (Signed)
Subjective:  By signing my name below, I, Kayla [name], attest that this documentation has been prepared under the direction and in the presence of Kayla Cheadle, MD Electronically Signed: [name], ED Scribe 09/21/2016 at 12:38 PM.   Patient ID: Kayla [name], female    DOB: [birth year], 71 y.o.   MRN: [id number]  Chief Complaint  Patient presents with  . Edema    bilateral legs  . Gynecologic Exam   HPI Kayla [name] is a 71 y.o. female who presents to Primary Care at [facility] complaining of leg swelling. Pt was seen in the ED for chest pain and leg pain 10 days ago. She had taken Propranolol and symptoms resolved. Troponin was negative, BNP was normal. Pt was advised to follow up with cardiology outpatient which she did yesterday. Cardiology attributed symptoms to anxiety and stress and changed propranolol from 10 to 2.5 start schedule daily.  Pt reports gradually worsening bilateral leg swelling starts right above her ankles. She has also noticed worsened burning, pins and needles sensation in her feet that is exacerbated with standing and has exacerbated since stopping Gabapentin. Pt has noticed that symptoms have also increased depending on her diet; stating her diet fluctuates depending on diet. She checks her blood sugar several times during the day; in the morning and after eating. States she has seen a rheumatologist for symptoms who advised her to stop Lasix.   Anxiety  She states she is still having panic attacks however she stopped Klonopin due to side-effects of "loopiness".   GU Pt also reports malodor and brown vaginal discharge.  Past Medical History:  Diagnosis Date  . Allergy   . Anemia    anemia in the past  . Angina    takes Propanolol prn and it stops her chest  . Anxiety   . Barrett esophagus    not consistently present  . Cataract   . Chronic kidney disease    kidney stones and UTI  . Complication of anesthesia    either  BP or pulse dropped with  last surgery  . DDD (degenerative disc disease)    cervical and lumbar  . Dementia   . Depression    post traumatic stress  disorder  . Diabetes mellitus    sugar goes up and down diet controlled  . Dysrhythmia    brought on by stress  . External hemorrhoids   . Fatigue   . Fibromyalgia    neuropathy knees ankles and toes, bladder  . GERD (gastroesophageal reflux disease)   . H/O hyperparathyroidism   . Headache   . Headache(784.0)    migraines  . Hiatal hernia   . History of kidney stones   . Hypertension    3 small brain aneurysms  . IBS (irritable bowel syndrome)   . Internal hemorrhoids   . Macular degeneration   . Migraines   . Osteoarthritis    all over  . Osteopetrosis   . Osteoporosis   . Peripheral vascular disease (HCC)    superficial phlebitis in left calf  . Personal history of colonic polyps 07/22/2013   07/2013 - 3 small adenomas - repeat colonoscopy 2018   Current Outpatient Prescriptions on File Prior to Visit  Medication Sig Dispense Refill  . Ascorbic Acid (VITAMIN C) 1000 MG tablet Take 1,000 mg by mouth daily.    Marland Kitchen aspirin-acetaminophen-caffeine (EXCEDRIN MIGRAINE) 250-250-65 MG tablet Take 2 tablets by mouth 2 (two) times daily.    . beta carotene  w/minerals (OCUVITE) tablet Take 1 tablet by mouth 2 (two) times daily.    Marland Kitchen CALCIUM-MAGNESIUM-ZINC PO Take by mouth daily.    . Cholecalciferol (VITAMIN D3) 2000 UNITS TABS Take 2,000 Units by mouth daily.    . clonazePAM (KLONOPIN) 1 MG tablet Take 0.5-1 tablets (0.5-1 mg total) by mouth 2 (two) times daily as needed. anxiety/panic attacks. May fill on or after March 2018. This is a 3 month supply 180 tablet 1  . Cyanocobalamin (VITAMIN B-12) 1000 MCG SUBL Place 5,000 mcg under the tongue daily.     . cyclobenzaprine (FLEXERIL) 10 MG tablet Take 1 tablet (10 mg total) by mouth 3 (three) times daily as needed for muscle spasms. 270 tablet 1  . cycloSPORINE (RESTASIS) 0.05 % ophthalmic emulsion 2 (two) times  daily.    . diclofenac sodium (VOLTAREN) 1 % GEL Apply 2 g topically 4 (four) times daily. (Patient taking differently: Apply 2 g topically as needed (pain). ) 700 g 1  . dicyclomine (BENTYL) 10 MG capsule Take 1-2 tab 3 times daily AC as needed for spasms and cramping. 270 capsule 1  . diltiazem 2 % GEL Apply 1 application topically 2 (two) times daily. (Patient taking differently: Apply 1 application topically as needed (pain). ) 30 g 0  . docusate sodium (COLACE) 250 MG capsule Take 250 mg by mouth daily.    Marland Kitchen esomeprazole (NEXIUM) 40 MG capsule Take 1 capsule (40 mg total) by mouth daily before breakfast. 90 capsule 1  . fentaNYL (DURAGESIC - DOSED MCG/HR) 12 MCG/HR Place 12.5 mcg onto the skin every 3 (three) days.    . fentaNYL (DURAGESIC - DOSED MCG/HR) 50 MCG/HR Place 50 mcg onto the skin every 3 (three) days.    . hypromellose (SYSTANE OVERNIGHT THERAPY) 0.3 % GEL ophthalmic ointment Place 1 drop into both eyes at bedtime. Reported on 02/11/2015    . lidocaine (LIDODERM) 5 % Place 1 patch onto the skin as needed. Remove & Discard patch within 12 hours. May dispense as 3 month supply 90 patch 3  . Lifitegrast (XIIDRA) 5 % SOLN Place 1 drop into both eyes daily.     . Loteprednol Etabonate (LOTEMAX) 0.5 % GEL Place 1 drop into both eyes 2 (two) times daily. Reported on 02/11/2015    . mirabegron ER (MYRBETRIQ) 25 MG TB24 tablet Take 1 tablet (25 mg total) by mouth daily. 90 tablet 1  . Naloxone HCl 0.4 MG/0.4ML SOAJ Administer 0.4mg  [place] at first sign of opioid overdose and repeat every 2 minutes as needed for resuscitation. Call 911 immediately 5 Package 0  . Omega-3 Fatty Acids (SEA-OMEGA PO) Take 1,000 mg by mouth daily.    . ondansetron (ZOFRAN) 8 MG tablet Take 1 tablet (8 mg total) by mouth every 8 (eight) hours as needed for nausea or vomiting. 20 tablet 0  . propranolol (INDERAL) 10 MG tablet Take 0.5 tablets (5 mg total) by mouth daily.    Marland Kitchen pyridoxine (B-6) 200 MG tablet Take 200 mg by  mouth daily.    . SUMAtriptan (IMITREX) 100 MG tablet Take 1 tablet at earliest onset of headache, may repeat in 2 hours if headache persists or reoccurs. (Patient taking differently: Take 100 mg by mouth every 3 (three) days. ) 30 tablet 1  . traZODone (DESYREL) 100 MG tablet Take 0.5-1 tablets (50-100 mg total) by mouth at bedtime as needed for sleep. And 1/2 -1 if wakes at 4am. (Patient taking differently: Take 25 mg by mouth at  bedtime. ) 90 tablet 1  . Vitamin D, Ergocalciferol, (DRISDOL) 50000 units CAPS capsule Take 1 capsule (50,000 Units total) by mouth every 7 (seven) days. 12 capsule 0  . [DISCONTINUED] buPROPion (WELLBUTRIN) 75 MG tablet Take 75 mg by mouth daily after breakfast.      Current Facility-Administered Medications on File Prior to Visit  Medication Dose Route Frequency Provider Last Rate Last Dose  . cyanocobalamin ((VITAMIN B-12)) injection 1,000 mcg  1,000 mcg Intramuscular Q30 days Shawnee Knapp, MD   1,000 mcg at 07/01/16 1646   Allergies  Allergen Reactions  . Ambien [Zolpidem Tartrate]     Hallucinations  . Codeine Anaphylaxis  . Oxycodone-Acetaminophen Shortness Of Breath  . Tylox [Oxycodone-Acetaminophen] Anaphylaxis    Chest pain  . Darvocet [Propoxyphene N-Acetaminophen]     Chest pain  . Darvon [Propoxyphene] Itching  . Food Swelling    Peanuts-wheat-  . Lorcet [Hydrocodone-Acetaminophen]     Chest pain  . Opium     hallucinations  . Vicodin [Hydrocodone-Acetaminophen] Other (See Comments)    Chest Pain   . Wheat Bran   . Zolpidem Tartrate Other (See Comments)    Makes her go crazy  . Zolpidem Tartrate     hallucinations  . Amoxicillin Nausea And Vomiting    Reaction:  Migraine headache  . Penicillins Nausea And Vomiting    Migraine Headaches   Past Surgical History:  Procedure Laterality Date  . ABDOMINAL HYSTERECTOMY    . APPENDECTOMY    . CARDIAC CATHETERIZATION  03/22/1991   EF 61%  . CARDIOVASCULAR STRESS TEST  10/03/2006  .  CHOLECYSTECTOMY    . COLONOSCOPY  06/23/2008   normal  . DILATION AND CURETTAGE OF UTERUS     x2  . ESOPHAGUS SURGERY    . EXPLORATORY LAPAROTOMY  1993  . GASTRIC BYPASS  1999  . GASTRIC RESTRICTION SURGERY  1991  . Right Arm Surgery    . Right Knee Arthroscopy    . Rt wrist fx  2009  . stomach stappeling  1991  . STONE EXTRACTION WITH BASKET  2012  . TONSILLECTOMY    . TUBAL LIGATION    . UPPER GASTROINTESTINAL ENDOSCOPY  06/23/2008   w/Dil, Barrett's esophagus  . US ECHOCARDIOGRAPHY  01/21/2007   EF 55-60%  . US ECHOCARDIOGRAPHY  08/30/2003   EF 55-60%   Family History  Problem Relation Age of Onset  . Stroke Mother   . Lung cancer Mother   . Heart disease Mother   . Diabetes Mother   . Hypertension Father   . Stroke Father   . Lung cancer Father   . Heart disease Father   . Kidney disease Father   . Seizures Father        epilepsy, and sisters x 2  . Pancreatic cancer Sister   . Lung cancer Sister   . Colon cancer Maternal Aunt        12 relatives  . Uterine cancer Unknown        aunt  . Heart disease Unknown        grandmother  . Clotting disorder Sister   . Heart disease Sister        x 2  . Kidney disease Sister        x 2  . Breast cancer Sister   . Colon cancer Paternal Aunt   . Colon cancer Paternal Aunt    Social History   Social History  . Marital status: Married  Spouse name: Rushie Chestnut.  . Number of children: 2  . Years of education: N/A   Occupational History  . Retired   .  Retired   Social History Main Topics  . Smoking status: Never Smoker  . Smokeless tobacco: Never Used  . Alcohol use No  . Drug use: No  . Sexual activity: Not Currently   Other Topics Concern  . None   Social History Narrative  . None   Depression screen [facility] 2/9 08/19/2016 07/19/2016 07/01/2016 06/27/2016 02/26/2016  Decreased Interest 0 0 0 0 0  Down, Depressed, Hopeless 0 0 0 0 0  PHQ - 2 Score 0 0 0 0 0  Altered sleeping - - - - -  Tired, decreased energy - -  - - -  Change in appetite - - - - -  Feeling bad or failure about yourself  - - - - -  Trouble concentrating - - - - -  Moving slowly or fidgety/restless - - - - -  Suicidal thoughts - - - - -  PHQ-9 Score - - - - -  Difficult doing work/chores - - - - -  Some recent data might be hidden    Review of Systems  Cardiovascular: Positive for leg swelling.  Genitourinary: Positive for vaginal discharge.  Neurological: Positive for numbness.      Objective:   Physical Exam  Constitutional: She is oriented to person, place, and time. She appears well-developed and well-nourished. No distress.  HENT:  Head: Normocephalic and atraumatic.  Eyes: Conjunctivae and EOM are normal.  Neck: Neck supple. No tracheal deviation present.  Cardiovascular: Normal rate.   Pulmonary/Chest: Effort normal. No respiratory distress.  Musculoskeletal: Normal range of motion.  Neurological: She is alert and oriented to person, place, and time.  Skin: Skin is warm and dry.  Psychiatric: She has a normal mood and affect. Her behavior is normal.  Nursing note and vitals reviewed.  BP (!) 96/56   Pulse 78   Temp 98.5 F (36.9 C) (Oral)   Resp 16   Ht 5\' 5"  (1.651 m)   Wt 111 lb 3.2 oz (50.4 kg)   SpO2 96%   BMI 18.50 kg/m     Results for orders placed or performed in visit on 09/21/16  POCT urinalysis dipstick  Result Value Ref Range   Color, UA yellow yellow   Clarity, UA clear clear   Glucose, UA negative negative mg/dL   Bilirubin, UA negative negative   Ketones, POC UA small (15) (A) negative mg/dL   Spec Grav, UA 1.025 1.010 - 1.025   Blood, UA negative negative   pH, UA 6.0 5.0 - 8.0   Protein Ur, POC negative negative mg/dL   Urobilinogen, UA 1.0 0.2 or 1.0 E.U./dL   Nitrite, UA Negative Negative   Leukocytes, UA Negative Negative  POCT Microscopic Urinalysis ([facility])  Result Value Ref Range   WBC,UR,HPF,POC None None WBC/hpf   RBC,UR,HPF,POC None None RBC/hpf   Bacteria None None,  Too numerous to count   Mucus Present (A) Absent   Epithelial Cells, UR Per Microscopy Few (A) None, Too numerous to count cells/hpf  POCT Wet + KOH Prep  Result Value Ref Range   Yeast by KOH Absent Absent   Yeast by wet prep Absent Absent   WBC by wet prep Few Few   Clue Cells Wet Prep HPF POC None None   Trich by wet prep Absent Absent   Bacteria Wet Prep HPF  POC Few Few   Epithelial Cells By [employer] Pref ([facility]) Moderate (A) None, Few, Too numerous to count   RBC,UR,HPF,POC None None RBC/hpf   Assessment & Plan:   1. Vaginal discharge - wet prep self-collected and nnml, RTC for pelvic if sxs cont  2. Peripheral polyneuropathy - sxs worsening for past sev mos but esp over the past mo since she stopped the gabapentin and celebrex.   3. Vitamin B12 deficiency   4. Labile blood glucose - pt with mult hypoglycemic episodes  5. Hypoglycemia   6. Fasting hypoglycemia   7. Arthralgia, unspecified joint     Orders Placed This Encounter  Procedures  . Vitamin B12  . RPR  . Homocysteine  . Methylmalonic Acid, Serum  . Fructosamine  . Methylmalonic acid, serum  . RPR  . Homocysteine  . Vitamin B12  . Ambulatory referral to Endocrinology    Referral Priority:   Routine    Referral Type:   Consultation    Referral Reason:   Specialty Services Required    Number of Visits Requested:   1  . Ambulatory referral to Rheumatology    Referral Priority:   Routine    Referral Type:   Consultation    Referral Reason:   Specialty Services Required    Referred to Provider:   Bo Merino, MD    Requested Specialty:   Rheumatology    Number of Visits Requested:   1  . Ambulatory referral to Neurology    Referral Priority:   Routine    Referral Type:   Consultation    Referral Reason:   Specialty Services Required    Referred to Provider:   Pieter Partridge, DO    Requested Specialty:   Neurology    Number of Visits Requested:   1  . POCT urinalysis dipstick  . POCT Microscopic  Urinalysis ([facility])  . POCT Wet + KOH Prep    Meds ordered this encounter  Medications  . cyanocobalamin ((VITAMIN B-12)) injection 1,000 mcg  . gabapentin (NEURONTIN) 300 MG capsule    Sig: Take 1 capsule (300 mg total) by mouth 2 (two) times daily.    Dispense:  180 capsule    Refill:  0    I personally performed the services described in this documentation, which was scribed in my presence. The recorded information has been reviewed and considered, and addended by me as needed.   Kayla [name], M.D.  Primary Care at [facility] [address] [place],  [address] [phone] phone [phone] fax  09/23/16 11:58 PM

## 2016-09-21 NOTE — Patient Instructions (Addendum)
IF you received an x-ray today, you will receive an invoice from [facility]. Please contact [facility] at [phone] with questions or concerns regarding your invoice.   IF you received labwork today, you will receive an invoice from [facility]. Please contact [facility] at [phone] with questions or concerns regarding your invoice.   Our billing staff will not be able to assist you with questions regarding bills from these companies.  You will be contacted with the lab results as soon as they are available. The fastest way to get your results is to activate your My Chart account. Instructions are located on the last page of this paperwork. If you have not heard from [place] regarding the results in 2 weeks, please contact this office.     Peripheral Neuropathy Peripheral neuropathy is a type of nerve damage. It affects nerves that carry signals between the spinal cord and other parts of the body. These are called peripheral nerves. With peripheral neuropathy, one nerve or a group of nerves may be damaged. What are the causes? Many things can damage peripheral nerves. For some people with peripheral neuropathy, the cause is unknown. Some causes include:  Diabetes. This is the most common cause of peripheral neuropathy.  Injury to a nerve.  Pressure or stress on a nerve that lasts a long time.  Too little vitamin B. Alcoholism can lead to this.  Infections.  Autoimmune diseases, such as multiple sclerosis and systemic lupus erythematosus.  Inherited nerve diseases.  Some medicines, such as cancer drugs.  Toxic substances, such as lead and mercury.  Too little blood flowing to the legs.  Kidney disease.  Thyroid disease.  What are the signs or symptoms? Different people have different symptoms. The symptoms you have will depend on which of your nerves is damaged. Common symptoms include:  Loss of feeling (numbness) in the feet and hands.  Tingling in  the feet and hands.  Pain that burns.  Very sensitive skin.  Weakness.  Not being able to move a part of the body (paralysis).  Muscle twitching.  Clumsiness or poor coordination.  Loss of balance.  Not being able to control your bladder.  Feeling dizzy.  Sexual problems.  How is this diagnosed? Peripheral neuropathy is a symptom, not a disease. Finding the cause of peripheral neuropathy can be hard. To figure that out, your health care provider will take a medical history and do a physical exam. A neurological exam will also be done. This involves checking things affected by your brain, spinal cord, and nerves (nervous system). For example, your health care provider will check your reflexes, how you move, and what you can feel. Other types of tests may also be ordered, such as:  Blood tests.  A test of the fluid in your spinal cord.  Imaging tests, such as CT scans or an MRI.  Electromyography (EMG). This test checks the nerves that control muscles.  Nerve conduction velocity tests. These tests check how fast messages pass through your nerves.  Nerve biopsy. A small piece of nerve is removed. It is then checked under a microscope.  How is this treated?  Medicine is often used to treat peripheral neuropathy. Medicines may include: ? Pain-relieving medicines. Prescription or over-the-counter medicine may be suggested. ? Antiseizure medicine. This may be used for pain. ? Antidepressants. These also may help ease pain from neuropathy. ? Lidocaine. This is a numbing medicine. You might wear a patch or be given a shot. ? Mexiletine.  This medicine is typically used to help control irregular heart rhythms.  Surgery. Surgery may be needed to relieve pressure on a nerve or to destroy a nerve that is causing pain.  Physical therapy to help movement.  Assistive devices to help movement. Follow these instructions at home:  Only take over-the-counter or prescription medicines  as directed by your health care provider. Follow the instructions carefully for any given medicines. Do not take any other medicines without first getting approval from your health care provider.  If you have diabetes, work closely with your health care provider to keep your blood sugar under control.  If you have numbness in your feet: ? Check every day for signs of injury or infection. Watch for redness, warmth, and swelling. ? Wear padded socks and comfortable shoes. These help protect your feet.  Do not do things that put pressure on your damaged nerve.  Do not smoke. Smoking keeps blood from getting to damaged nerves.  Avoid or limit alcohol. Too much alcohol can cause a lack of B vitamins. These vitamins are needed for healthy nerves.  Develop a good support system. Coping with peripheral neuropathy can be stressful. Talk to a mental health specialist or join a support group if you are struggling.  Follow up with your health care provider as directed. Contact a health care provider if:  You have new signs or symptoms of peripheral neuropathy.  You are struggling emotionally from dealing with peripheral neuropathy.  You have a fever. Get help right away if:  You have an injury or infection that is not healing.  You feel very dizzy or begin vomiting.  You have chest pain.  You have trouble breathing. This information is not intended to replace advice given to you by your health care provider. Make sure you discuss any questions you have with your health care provider. Document Released: 12/21/2001 Document Revised: 06/08/2015 Document Reviewed: 09/07/2012 Elsevier Interactive Patient Education  2017 [employer].

## 2016-09-24 LAB — METHYLMALONIC ACID, SERUM: Methylmalonic Acid: 209 nmol/L (ref 0–378)

## 2016-09-24 LAB — FRUCTOSAMINE: FRUCTOSAMINE: 238 umol/L (ref 0–285)

## 2016-09-24 LAB — RPR: RPR: NONREACTIVE

## 2016-09-24 LAB — HOMOCYSTEINE: HOMOCYSTEINE: 7.8 umol/L (ref 0.0–15.0)

## 2016-09-24 LAB — VITAMIN B12: Vitamin B-12: >2000 pg/mL — ABNORMAL HIGH (ref 232–1245)

## 2016-09-25 ENCOUNTER — Ambulatory Visit (INDEPENDENT_AMBULATORY_CARE_PROVIDER_SITE_OTHER): Payer: Medicare Other | Admitting: Internal Medicine

## 2016-09-25 ENCOUNTER — Encounter: Payer: Self-pay | Admitting: Internal Medicine

## 2016-09-25 VITALS — BP 104/64 | HR 80 | Ht 65.0 in | Wt 116.0 lb

## 2016-09-25 DIAGNOSIS — G478 Other sleep disorders: Secondary | ICD-10-CM | POA: Diagnosis not present

## 2016-09-25 DIAGNOSIS — R1012 Left upper quadrant pain: Secondary | ICD-10-CM | POA: Diagnosis not present

## 2016-09-25 DIAGNOSIS — K588 Other irritable bowel syndrome: Secondary | ICD-10-CM

## 2016-09-25 DIAGNOSIS — R0683 Snoring: Secondary | ICD-10-CM | POA: Diagnosis not present

## 2016-09-25 DIAGNOSIS — G4719 Other hypersomnia: Secondary | ICD-10-CM | POA: Diagnosis not present

## 2016-09-25 DIAGNOSIS — K911 Postgastric surgery syndromes: Secondary | ICD-10-CM | POA: Diagnosis not present

## 2016-09-25 DIAGNOSIS — K594 Anal spasm: Secondary | ICD-10-CM | POA: Diagnosis not present

## 2016-09-25 DIAGNOSIS — R0681 Apnea, not elsewhere classified: Secondary | ICD-10-CM | POA: Diagnosis not present

## 2016-09-25 DIAGNOSIS — G8929 Other chronic pain: Secondary | ICD-10-CM | POA: Diagnosis not present

## 2016-09-25 DIAGNOSIS — G2581 Restless legs syndrome: Secondary | ICD-10-CM | POA: Diagnosis not present

## 2016-09-25 NOTE — Progress Notes (Signed)
Kayla [name] 71 y.o. Apr 09, 1945 [id number]  Assessment & Plan:   Encounter Diagnoses  Name Primary?  . Other irritable bowel syndrome Yes  . Post gastrectomy syndrome   . Proctalgia fugax   . Chronic LUQ pain - burning      Lactulose hydrogen Breath test to test her small intestinal bacterial overgrowth IB Gard treatment for bloating and irritable bowel syndrome F/u after test results are in Brace upper surgical scar when bending Warm soaks - suppository for proctalgia fugax  Her situation is very complicated though I think she has a chronic functional disorder and disturbances related to previous surgery. She has a cancer phobia, I have consented to genetics here and she went to genetics and [facility] as well. I don't know she's never followed through with testing there is a long family history of numerous cancers by report.  At times I have wondered if there is not a Munchhausen's component to her situation. She certainly has at least a heightened concern for serious health problems in the setting of chronic recurrent symptoms that have received multiple evaluations that have not shown serious disease.  I appreciate the opportunity to care for this patient. CC: Shawnee Knapp, MD   Subjective:   Chief Complaint:Bloating I need another colonoscopy and upper endoscopy  HPI The patient returns after not being seen since 2014. She was last seen by GI in 2015 in 2017 in [place], by Dr. Liane Comber.  Records show 3 mm ascending colon polyp removed 03/07/2015 mixed hemorrhoids otherwise normal colonoscopy. Small hiatal hernia and patent Billroth II gastrojejunostomy and a normal exam and jejunum. Same date. EGD. Pathology results were adenoma and no evidence of celiac disease. She has chronic irritable bowel and bloating problems and is status post gastric bypass surgery and a revision with Billroth II anatomy. She has many years of bloating and abdominal distention and altered  bowel habits and abdominal pain.  Today her specific complaints are:  Chronic recurrent market abdominal distention and bloating  Chronic recurrent burning left upper quadrant pain  When she bends over she feels something at the lower sternum or high epigastrium pop and she has to stop because there is pain. There is no clear protrusion.  Intermittent painful crampy severe rectal pain 2 or 3 times a month at night, relieved by placement of a suppository  She is requesting another colonoscopy and endoscopy because she thinks it's been several years since she has had one.  She is also complaining of painful diffuse burning of the lower extremities and thinks they are swelling. Allergies  Allergen Reactions  . Ambien [Zolpidem Tartrate]     Hallucinations  . Codeine Anaphylaxis  . Oxycodone-Acetaminophen Shortness Of Breath  . Tylox [Oxycodone-Acetaminophen] Anaphylaxis    Chest pain  . Darvocet [Propoxyphene N-Acetaminophen]     Chest pain  . Darvon [Propoxyphene] Itching  . Food Swelling    Peanuts-wheat-  . Lorcet [Hydrocodone-Acetaminophen]     Chest pain  . Opium     hallucinations  . Vicodin [Hydrocodone-Acetaminophen] Other (See Comments)    Chest Pain   . Wheat Bran   . Zolpidem Tartrate Other (See Comments)    Makes her go crazy  . Zolpidem Tartrate     hallucinations  . Amoxicillin Nausea And Vomiting    Reaction:  Migraine headache  . Penicillins Nausea And Vomiting    Migraine Headaches   Current Meds  Medication Sig  . Ascorbic Acid (VITAMIN C) 1000 MG tablet Take  1,000 mg by mouth daily.  Marland Kitchen aspirin-acetaminophen-caffeine (EXCEDRIN MIGRAINE) 250-250-65 MG tablet Take 2 tablets by mouth 2 (two) times daily.  . beta carotene w/minerals (OCUVITE) tablet Take 1 tablet by mouth 2 (two) times daily.  Marland Kitchen CALCIUM-MAGNESIUM-ZINC PO Take by mouth daily.  . Cholecalciferol (VITAMIN D3) 2000 UNITS TABS Take 2,000 Units by mouth daily.  . clonazePAM (KLONOPIN) 1 MG  tablet Take 0.5-1 tablets (0.5-1 mg total) by mouth 2 (two) times daily as needed. anxiety/panic attacks. May fill on or after March 2018. This is a 3 month supply  . Cyanocobalamin (VITAMIN B-12) 1000 MCG SUBL Place 5,000 mcg under the tongue daily.   . cyclobenzaprine (FLEXERIL) 10 MG tablet Take 1 tablet (10 mg total) by mouth 3 (three) times daily as needed for muscle spasms.  . cycloSPORINE (RESTASIS) 0.05 % ophthalmic emulsion 2 (two) times daily.  . diclofenac sodium (VOLTAREN) 1 % GEL Apply 2 g topically 4 (four) times daily. (Patient taking differently: Apply 2 g topically as needed (pain). )  . dicyclomine (BENTYL) 10 MG capsule Take 1-2 tab 3 times daily AC as needed for spasms and cramping.  . diltiazem 2 % GEL Apply 1 application topically 2 (two) times daily. (Patient taking differently: Apply 1 application topically as needed (pain). )  . docusate sodium (COLACE) 250 MG capsule Take 250 mg by mouth daily.  Marland Kitchen esomeprazole (NEXIUM) 40 MG capsule Take 1 capsule (40 mg total) by mouth daily before breakfast.  . fentaNYL (DURAGESIC - DOSED MCG/HR) 12 MCG/HR Place 12.5 mcg onto the skin every 3 (three) days.  . fentaNYL (DURAGESIC - DOSED MCG/HR) 50 MCG/HR Place 50 mcg onto the skin every 3 (three) days.  Marland Kitchen gabapentin (NEURONTIN) 300 MG capsule Take 1 capsule (300 mg total) by mouth 2 (two) times daily.  . hypromellose (SYSTANE OVERNIGHT THERAPY) 0.3 % GEL ophthalmic ointment Place 1 drop into both eyes at bedtime. Reported on 02/11/2015  . lidocaine (LIDODERM) 5 % Place 1 patch onto the skin as needed. Remove & Discard patch within 12 hours. May dispense as 3 month supply  . Lifitegrast (XIIDRA) 5 % SOLN Place 1 drop into both eyes daily.   . Loteprednol Etabonate (LOTEMAX) 0.5 % GEL Place 1 drop into both eyes 2 (two) times daily. Reported on 02/11/2015  . mirabegron ER (MYRBETRIQ) 25 MG TB24 tablet Take 1 tablet (25 mg total) by mouth daily.  . Naloxone HCl 0.4 MG/0.4ML SOAJ Administer  0.4mg  [place] at first sign of opioid overdose and repeat every 2 minutes as needed for resuscitation. Call 911 immediately  . Omega-3 Fatty Acids (SEA-OMEGA PO) Take 1,000 mg by mouth daily.  . ondansetron (ZOFRAN) 8 MG tablet Take 1 tablet (8 mg total) by mouth every 8 (eight) hours as needed for nausea or vomiting.  . propranolol (INDERAL) 10 MG tablet Take 0.5 tablets (5 mg total) by mouth daily.  Marland Kitchen pyridoxine (B-6) 200 MG tablet Take 200 mg by mouth daily.  . SUMAtriptan (IMITREX) 100 MG tablet Take 1 tablet at earliest onset of headache, may repeat in 2 hours if headache persists or reoccurs. (Patient taking differently: Take 100 mg by mouth every 3 (three) days. )  . traZODone (DESYREL) 100 MG tablet Take 0.5-1 tablets (50-100 mg total) by mouth at bedtime as needed for sleep. And 1/2 -1 if wakes at 4am. (Patient taking differently: Take 25 mg by mouth at bedtime. )  . Vitamin D, Ergocalciferol, (DRISDOL) 50000 units CAPS capsule Take 1 capsule (50,000  Units total) by mouth every 7 (seven) days.   Current Facility-Administered Medications for the 09/25/16 encounter (Office Visit) with Gatha Mayer, MD  Medication  . cyanocobalamin ((VITAMIN B-12)) injection 1,000 mcg  . cyanocobalamin ((VITAMIN B-12)) injection 1,000 mcg   Past Medical History:  Diagnosis Date  . Allergy   . Anemia    anemia in the past  . Angina    takes Propanolol prn and it stops her chest  . Anxiety   . Barrett esophagus    not consistently present  . Cataract   . Chronic kidney disease    kidney stones and UTI  . Complication of anesthesia    either  BP or pulse dropped with last surgery  . DDD (degenerative disc disease)    cervical and lumbar  . Dementia   . Depression    post traumatic stress  disorder  . Diabetes mellitus    sugar goes up and down diet controlled  . Dysrhythmia    brought on by stress  . External hemorrhoids   . Fatigue   . Fibromyalgia    neuropathy knees ankles and toes,  bladder  . GERD (gastroesophageal reflux disease)   . H/O hyperparathyroidism   . Headache(784.0)    migraines  . Hiatal hernia   . History of kidney stones   . Hypertension    3 small brain aneurysms  . IBS (irritable bowel syndrome)   . Internal hemorrhoids   . Macular degeneration   . Migraines   . Osteoarthritis    all over  . Osteopetrosis   . Peripheral vascular disease (HCC)    superficial phlebitis in left calf  . Personal history of colonic polyps 07/22/2013   07/2013 - 3 small adenomas - repeat colonoscopy 2018   Past Surgical History:  Procedure Laterality Date  . ABDOMINAL HYSTERECTOMY    . APPENDECTOMY    . CARDIAC CATHETERIZATION  03/22/1991   EF 61%  . CARDIOVASCULAR STRESS TEST  10/03/2006  . CHOLECYSTECTOMY    . COLONOSCOPY  06/23/2008   normal  . DILATION AND CURETTAGE OF UTERUS     x2  . ESOPHAGUS SURGERY    . EXPLORATORY LAPAROTOMY  1993  . GASTRIC BYPASS  1999  . GASTRIC RESTRICTION SURGERY  1991  . Right Arm Surgery    . Right Knee Arthroscopy    . Rt wrist fx  2009  . stomach stappeling  1991  . STONE EXTRACTION WITH BASKET  2012  . TONSILLECTOMY    . TUBAL LIGATION    . UPPER GASTROINTESTINAL ENDOSCOPY  06/23/2008   w/Dil, Barrett's esophagus  . US ECHOCARDIOGRAPHY  01/21/2007   EF 55-60%  . US ECHOCARDIOGRAPHY  08/30/2003   EF 55-60%   Social History   Social History  . Marital status: Married    Spouse name: Rushie Chestnut.  . Number of children: 2  . Years of education: N/A   Occupational History  . Disability Retired   Social History Main Topics  . Smoking status: Never Smoker  . Smokeless tobacco: Never Used  . Alcohol use No  . Drug use: No  . Sexual activity: Not Currently    Social History Narrative   She retired on disability   Married   2 Children   family history includes Breast cancer in her sister; Clotting disorder in her sister; Colon cancer in her maternal aunt, paternal aunt, and paternal aunt; Diabetes in her mother;  Heart disease in her father, mother,  sister, and unknown relative; Hypertension in her father; Kidney disease in her father and sister; Lung cancer in her father, mother, and sister; Pancreatic cancer in her sister; Seizures in her father; Stroke in her father and mother; Uterine cancer in her unknown relative.   Review of Systems   Objective:   Physical Exam @BP  104/64 (BP Location: Left Arm, Patient Position: Sitting, Cuff Size: Normal)   Pulse 80   Ht 5\' 5"  (1.651 m) Comment: height measured without shoes  Wt 116 lb (52.6 kg)   BMI 19.30 kg/m @  General:  Well-developed, well-nourished and in no acute distress Eyes:  anicteric. ENT:   Mouth and posterior pharynx free of lesions.  Neck:   supple w/o thyromegaly or mass.  Lungs: Clear to auscultation bilaterally. Heart:  S1S2, no rubs, murmurs, gallops. Abdomen:  soft, non-tender, no hepatosplenomegaly, hernia, or mass and BS+. SCAR midline no hernia but staples present top part Rectal:  Patti [place], [place] present.  Digital rectal exam  NL anoderm, NL tone nontender soft brown stool in vault and no mass  Ansocopy Gr 1 internal hemorrhoids in all positions no inflammation  Lymph:  no cervical or supraclavicular adenopathy. Extremities:   no edema, cyanosis or clubbing Skin   no rash. Neuro:  A&O x 3.  Psych:  appropriate mood and  Affect.   Data Reviewed:  Extensive chart review from the past and recent past. Notably methylmalonic acid homocystine are all normal area B12 level is high. Complete blood counts back to June and longer are normal. Some trivial mild elect to light abnormalities from time to time. I've reviewed primary care and cardiology notes. Emergency room notes. Endoscopic photos are reviewed.

## 2016-09-25 NOTE — Patient Instructions (Signed)
  Brace yourself with bending to help the pain from your surgical scar.   We are giving you an IBgard coupon today. This is over the counter.   We are ordering a Breath test today, they will contact you about shipping this to you.    I appreciate the opportunity to care for you. Silvano Rusk, MD, [facility]

## 2016-09-26 ENCOUNTER — Encounter: Payer: Self-pay | Admitting: Internal Medicine

## 2016-09-30 ENCOUNTER — Ambulatory Visit: Payer: Medicare Other

## 2016-09-30 ENCOUNTER — Other Ambulatory Visit (HOSPITAL_BASED_OUTPATIENT_CLINIC_OR_DEPARTMENT_OTHER): Payer: Self-pay

## 2016-09-30 DIAGNOSIS — G4730 Sleep apnea, unspecified: Secondary | ICD-10-CM

## 2016-09-30 DIAGNOSIS — R5383 Other fatigue: Secondary | ICD-10-CM

## 2016-09-30 DIAGNOSIS — R0683 Snoring: Secondary | ICD-10-CM

## 2016-10-01 ENCOUNTER — Other Ambulatory Visit: Payer: Medicare Other

## 2016-10-01 ENCOUNTER — Encounter: Payer: Self-pay | Admitting: Neurology

## 2016-10-01 ENCOUNTER — Ambulatory Visit (INDEPENDENT_AMBULATORY_CARE_PROVIDER_SITE_OTHER): Payer: Medicare Other | Admitting: Neurology

## 2016-10-01 ENCOUNTER — Ambulatory Visit: Payer: Self-pay | Admitting: Psychiatry

## 2016-10-01 VITALS — BP 96/60 | HR 81 | Ht 65.0 in | Wt 113.7 lb

## 2016-10-01 DIAGNOSIS — G629 Polyneuropathy, unspecified: Secondary | ICD-10-CM | POA: Diagnosis not present

## 2016-10-01 DIAGNOSIS — R202 Paresthesia of skin: Secondary | ICD-10-CM | POA: Diagnosis not present

## 2016-10-01 MED ORDER — PREGABALIN 50 MG PO CAPS: 50.0000 mg | ORAL_CAPSULE | Freq: Three times a day (TID) | ORAL | 2 refills | Status: DC

## 2016-10-01 NOTE — Progress Notes (Addendum)
NEUROLOGY FOLLOW UP OFFICE NOTE  STEPHANE JUNKINS [id number]  HISTORY OF PRESENT ILLNESS: Kayla [name] is a 71 year old woman with fibromyalgia, anxiety, degenerative disc disease of cervical and lumbar spine, and history of nephrolithiasis and gastric bypass (1997) who whom I see for migraines presents today for neuropathy.  Since July, she has had increased swelling and burning numbness and tingling in the legs.  She had lower extremity vascular ABIs on 08/20/16, which was negative.  She has history of B12 deficiency but she takes injections and recent B12 level from 09/21/16 was over 2000.  Methylmalonic acid level was 209, RPR nonreactive, homocysteine 7.8.  Labs from 07/12/16 include normal SPEP, Sed Rate 2, CRP less than 0.3, and TSH 2.430 .  Vitamin D was 23 and she was advised to start supplementation.  Serum glucose has been 70s up to 110.  She also takes B6.  She also has history of weight loss.  She has fibromyalgia and was previously diagnosed with neuropathy by another neurologist.  She has been on gabapentin for many years.  It was briefly discontinued to see if it was contributing to the swelling.  She reported no significant change, so it was restarted (she takes 300mg  4 times daily), although she thinks it helped alittle bit.  PAST MEDICAL HISTORY: Past Medical History:  Diagnosis Date  . Allergy   . Anemia    anemia in the past  . Angina    takes Propanolol prn and it stops her chest  . Anxiety   . Barrett esophagus    not consistently present  . Cataract   . Chronic kidney disease    kidney stones and UTI  . Complication of anesthesia    either  BP or pulse dropped with last surgery  . DDD (degenerative disc disease)    cervical and lumbar  . Dementia   . Depression    post traumatic stress  disorder  . Diabetes mellitus    sugar goes up and down diet controlled  . Dysrhythmia    brought on by stress  . External hemorrhoids   . Fatigue   . Fibromyalgia    neuropathy knees ankles and toes, bladder  . GERD (gastroesophageal reflux disease)   . H/O hyperparathyroidism   . Headache(784.0)    migraines  . Hiatal hernia   . History of kidney stones   . Hypertension    3 small brain aneurysms  . IBS (irritable bowel syndrome)   . Internal hemorrhoids   . Macular degeneration   . Migraines   . Osteoarthritis    all over  . Osteopetrosis   . Peripheral vascular disease (HCC)    superficial phlebitis in left calf  . Personal history of colonic polyps 07/22/2013   07/2013 - 3 small adenomas - repeat colonoscopy 2018    MEDICATIONS: Current Outpatient Prescriptions on File Prior to Visit  Medication Sig Dispense Refill  . Ascorbic Acid (VITAMIN C) 1000 MG tablet Take 1,000 mg by mouth daily.    Marland Kitchen aspirin-acetaminophen-caffeine (EXCEDRIN MIGRAINE) 250-250-65 MG tablet Take 2 tablets by mouth 2 (two) times daily.    . beta carotene w/minerals (OCUVITE) tablet Take 1 tablet by mouth 2 (two) times daily.    Marland Kitchen CALCIUM-MAGNESIUM-ZINC PO Take by mouth daily.    . Cholecalciferol (VITAMIN D3) 2000 UNITS TABS Take 2,000 Units by mouth daily.    . clonazePAM (KLONOPIN) 1 MG tablet Take 0.5-1 tablets (0.5-1 mg total) by mouth 2 (two)  times daily as needed. anxiety/panic attacks. May fill on or after March 2018. This is a 3 month supply 180 tablet 1  . Cyanocobalamin (VITAMIN B-12) 1000 MCG SUBL Place 5,000 mcg under the tongue daily.     . cyclobenzaprine (FLEXERIL) 10 MG tablet Take 1 tablet (10 mg total) by mouth 3 (three) times daily as needed for muscle spasms. (Patient not taking: Reported on 10/01/2016) 270 tablet 1  . cycloSPORINE (RESTASIS) 0.05 % ophthalmic emulsion 2 (two) times daily.    . diclofenac sodium (VOLTAREN) 1 % GEL Apply 2 g topically 4 (four) times daily. (Patient taking differently: Apply 2 g topically as needed (pain). ) 700 g 1  . dicyclomine (BENTYL) 10 MG capsule Take 1-2 tab 3 times daily AC as needed for spasms and cramping. 270  capsule 1  . diltiazem 2 % GEL Apply 1 application topically 2 (two) times daily. (Patient taking differently: Apply 1 application topically as needed (pain). ) 30 g 0  . docusate sodium (COLACE) 250 MG capsule Take 250 mg by mouth daily.    Marland Kitchen esomeprazole (NEXIUM) 40 MG capsule Take 1 capsule (40 mg total) by mouth daily before breakfast. 90 capsule 1  . fentaNYL (DURAGESIC - DOSED MCG/HR) 12 MCG/HR Place 12.5 mcg onto the skin every 3 (three) days.    . fentaNYL (DURAGESIC - DOSED MCG/HR) 50 MCG/HR Place 50 mcg onto the skin every 3 (three) days.    Marland Kitchen gabapentin (NEURONTIN) 300 MG capsule Take 1 capsule (300 mg total) by mouth 2 (two) times daily. 180 capsule 0  . hypromellose (SYSTANE OVERNIGHT THERAPY) 0.3 % GEL ophthalmic ointment Place 1 drop into both eyes at bedtime. Reported on 02/11/2015    . lidocaine (LIDODERM) 5 % Place 1 patch onto the skin as needed. Remove & Discard patch within 12 hours. May dispense as 3 month supply 90 patch 3  . Lifitegrast (XIIDRA) 5 % SOLN Place 1 drop into both eyes daily.     . Loteprednol Etabonate (LOTEMAX) 0.5 % GEL Place 1 drop into both eyes 2 (two) times daily. Reported on 02/11/2015    . mirabegron ER (MYRBETRIQ) 25 MG TB24 tablet Take 1 tablet (25 mg total) by mouth daily. 90 tablet 1  . Naloxone HCl 0.4 MG/0.4ML SOAJ Administer 0.4mg   at first sign of opioid overdose and repeat every 2 minutes as needed for resuscitation. Call 911 immediately 5 Package 0  . Omega-3 Fatty Acids (SEA-OMEGA PO) Take 1,000 mg by mouth daily.    . ondansetron (ZOFRAN) 8 MG tablet Take 1 tablet (8 mg total) by mouth every 8 (eight) hours as needed for nausea or vomiting. 20 tablet 0  . propranolol (INDERAL) 10 MG tablet Take 0.5 tablets (5 mg total) by mouth daily.    Marland Kitchen pyridoxine (B-6) 200 MG tablet Take 200 mg by mouth daily.    . SUMAtriptan (IMITREX) 100 MG tablet Take 1 tablet at earliest onset of headache, may repeat in 2 hours if headache persists or reoccurs.  (Patient taking differently: Take 100 mg by mouth every 3 (three) days. ) 30 tablet 1  . traZODone (DESYREL) 100 MG tablet Take 0.5-1 tablets (50-100 mg total) by mouth at bedtime as needed for sleep. And 1/2 -1 if wakes at 4am. (Patient taking differently: Take 25 mg by mouth at bedtime. ) 90 tablet 1  . Vitamin D, Ergocalciferol, (DRISDOL) 50000 units CAPS capsule Take 1 capsule (50,000 Units total) by mouth every 7 (seven) days. 12 capsule  0  . [DISCONTINUED] buPROPion (WELLBUTRIN) 75 MG tablet Take 75 mg by mouth daily after breakfast.      Current Facility-Administered Medications on File Prior to Visit  Medication Dose Route Frequency Provider Last Rate Last Dose  . cyanocobalamin ((VITAMIN B-12)) injection 1,000 mcg  1,000 mcg Intramuscular Q30 days Shawnee Knapp, MD   1,000 mcg at 07/01/16 1646  . cyanocobalamin ((VITAMIN B-12)) injection 1,000 mcg  1,000 mcg Intramuscular Q30 days Shawnee Knapp, MD   1,000 mcg at 09/21/16 1348    ALLERGIES: Allergies  Allergen Reactions  . Ambien [Zolpidem Tartrate]     Hallucinations  . Codeine Anaphylaxis  . Oxycodone-Acetaminophen Shortness Of Breath  . Tylox [Oxycodone-Acetaminophen] Anaphylaxis    Chest pain  . Darvocet [Propoxyphene N-Acetaminophen]     Chest pain  . Darvon [Propoxyphene] Itching  . Food Swelling    Peanuts-wheat-  . Lorcet [Hydrocodone-Acetaminophen]     Chest pain  . Opium     hallucinations  . Vicodin [Hydrocodone-Acetaminophen] Other (See Comments)    Chest Pain   . Wheat Bran   . Zolpidem Tartrate Other (See Comments)    Makes her go crazy  . Zolpidem Tartrate     hallucinations  . Amoxicillin Nausea And Vomiting    Reaction:  Migraine headache  . Penicillins Nausea And Vomiting    Migraine Headaches    FAMILY HISTORY: Family History  Problem Relation Age of Onset  . Stroke Mother   . Lung cancer Mother   . Heart disease Mother   . Diabetes Mother   . Hypertension Father   . Stroke Father   . Lung  cancer Father   . Heart disease Father   . Kidney disease Father   . Seizures Father        epilepsy, and sisters x 2  . Pancreatic cancer Sister   . Lung cancer Sister   . Colon cancer Maternal Aunt        12 relatives  . Uterine cancer Unknown        aunt  . Heart disease Unknown        grandmother  . Clotting disorder Sister   . Heart disease Sister        x 2  . Kidney disease Sister        x 2  . Breast cancer Sister   . Colon cancer Paternal Aunt   . Colon cancer Paternal Aunt     SOCIAL HISTORY: Social History   Social History  . Marital status: Married    Spouse name: Rushie Chestnut.  . Number of children: 2  . Years of education: N/A   Occupational History  . Disability Retired   Social History Main Topics  . Smoking status: Never Smoker  . Smokeless tobacco: Never Used  . Alcohol use No  . Drug use: No  . Sexual activity: Not Currently   Other Topics Concern  . Not on file   Social History Narrative   She retired on disability   Married   2 Children    REVIEW OF SYSTEMS: Constitutional: No fevers, chills, or sweats, no generalized fatigue, change in appetite Eyes: No visual changes, double vision, eye pain Ear, nose and throat: No hearing loss, ear pain, nasal congestion, sore throat Cardiovascular: No chest pain, palpitations Respiratory:  No shortness of breath at rest or with exertion, wheezes GastrointestinaI: No nausea, vomiting, diarrhea, abdominal pain, fecal incontinence Genitourinary:  No dysuria, urinary retention or frequency  Musculoskeletal:  No neck pain, back pain Integumentary: No rash, pruritus, skin lesions Neurological: as above Psychiatric: No depression, insomnia, anxiety Endocrine: No palpitations, fatigue, diaphoresis, mood swings, change in appetite, change in weight, increased thirst Hematologic/Lymphatic:  No purpura, petechiae. Allergic/Immunologic: no itchy/runny eyes, nasal congestion, recent allergic reactions,  rashes  PHYSICAL EXAM: Vitals:   10/01/16 0937  BP: 96/60  Pulse: 81  SpO2: 98%   General: No acute distress.  Patient appears well-groomed.  normal body habitus. Head:  Normocephalic/atraumatic Eyes:  Fundi examined but not visualized Neck: supple, no paraspinal tenderness, full range of motion Heart:  Regular rate and rhythm Lungs:  Clear to auscultation bilaterally Back: No paraspinal tenderness Neurological Exam: alert and oriented to person, place, and time. Attention span and concentration intact, recent and remote memory intact, fund of knowledge intact.  Speech fluent and not dysarthric, language intact.  CN II-XII intact. Bulk and tone normal, muscle strength 5/5 throughout.  Sensation to pinprick intact, vibration reduced in toes  Deep tendon reflexes 2+ throughout, toes downgoing.  Finger to nose and heel to shin testing intact.  Gait normal, Romberg negative.  IMPRESSION: Neuropathy could explain the pain and paresthesias but not the swelling.  PLAN: 1.  Since she feels that the gabapentin mildly contributed to the swelling, instead of increasing gabapentin, we will start Lyrica 50mg  three times daily 2.  Will check B6 level 3.  Will order NCV-EMG of lower extremities 4.  Follow up after testing.  Metta Clines, DO  CC:  [name], MD

## 2016-10-01 NOTE — Patient Instructions (Signed)
1.  Start Lyrica 50mg  three times daily 2.  Check B6 level 3.  Nerve study of lower extremities 4.  Follow up after testing.

## 2016-10-03 DIAGNOSIS — M4692 Unspecified inflammatory spondylopathy, cervical region: Secondary | ICD-10-CM | POA: Diagnosis not present

## 2016-10-03 DIAGNOSIS — G894 Chronic pain syndrome: Secondary | ICD-10-CM | POA: Diagnosis not present

## 2016-10-03 DIAGNOSIS — M170 Bilateral primary osteoarthritis of knee: Secondary | ICD-10-CM | POA: Diagnosis not present

## 2016-10-03 DIAGNOSIS — M797 Fibromyalgia: Secondary | ICD-10-CM | POA: Diagnosis not present

## 2016-10-03 DIAGNOSIS — R51 Headache: Secondary | ICD-10-CM | POA: Diagnosis not present

## 2016-10-03 DIAGNOSIS — M5412 Radiculopathy, cervical region: Secondary | ICD-10-CM | POA: Diagnosis not present

## 2016-10-04 ENCOUNTER — Ambulatory Visit (INDEPENDENT_AMBULATORY_CARE_PROVIDER_SITE_OTHER): Payer: Medicare Other

## 2016-10-04 VITALS — BP 94/58 | HR 80 | Temp 97.9°F | Ht 65.0 in | Wt 111.0 lb

## 2016-10-04 DIAGNOSIS — Z23 Encounter for immunization: Secondary | ICD-10-CM

## 2016-10-04 DIAGNOSIS — Z0000 Encounter for general adult medical examination without abnormal findings: Secondary | ICD-10-CM

## 2016-10-04 LAB — VITAMIN B6: Vitamin B6: 10.7 ng/mL (ref 2.1–21.7)

## 2016-10-04 NOTE — Progress Notes (Signed)
Subjective:   Kayla [name] is a 71 y.o. female who presents for an Initial Medicare Annual Wellness Visit.  Review of Systems    N/A  Cardiac Risk Factors include: advanced age (>67men, >105 women);hypertension     Objective:    Today's Vitals   10/04/16 1000 10/04/16 1010  BP: (!) 94/58   Pulse: 80   Temp: 97.9 F (36.6 C)   TempSrc: Oral   Weight: 111 lb (50.3 kg)   Height: 5\' 5"  (1.651 m)   PainSc:  9    Body mass index is 18.47 kg/m.   Current Medications (verified) Outpatient Encounter Prescriptions as of 10/04/2016  Medication Sig  . Ascorbic Acid (VITAMIN C) 1000 MG tablet Take 1,000 mg by mouth daily.  Marland Kitchen aspirin-acetaminophen-caffeine (EXCEDRIN MIGRAINE) 250-250-65 MG tablet Take 2 tablets by mouth 2 (two) times daily.  . beta carotene w/minerals (OCUVITE) tablet Take 1 tablet by mouth 2 (two) times daily.  Marland Kitchen CALCIUM-MAGNESIUM-ZINC PO Take by mouth daily.  . Cholecalciferol (VITAMIN D3) 2000 UNITS TABS Take 2,000 Units by mouth daily.  . clonazePAM (KLONOPIN) 1 MG tablet Take 0.5-1 tablets (0.5-1 mg total) by mouth 2 (two) times daily as needed. anxiety/panic attacks. May fill on or after March 2018. This is a 3 month supply  . Cyanocobalamin (VITAMIN B-12) 1000 MCG SUBL Place 5,000 mcg under the tongue daily.   . cyclobenzaprine (FLEXERIL) 10 MG tablet Take 1 tablet (10 mg total) by mouth 3 (three) times daily as needed for muscle spasms.  . cycloSPORINE (RESTASIS) 0.05 % ophthalmic emulsion 2 (two) times daily.  . diclofenac sodium (VOLTAREN) 1 % GEL Apply 2 g topically 4 (four) times daily. (Patient taking differently: Apply 2 g topically as needed (pain). )  . dicyclomine (BENTYL) 10 MG capsule Take 1-2 tab 3 times daily AC as needed for spasms and cramping.  . diltiazem 2 % GEL Apply 1 application topically 2 (two) times daily. (Patient taking differently: Apply 1 application topically as needed (pain). )  . docusate sodium (COLACE) 250 MG capsule Take 250  mg by mouth daily.  Marland Kitchen esomeprazole (NEXIUM) 40 MG capsule Take 1 capsule (40 mg total) by mouth daily before breakfast.  . fentaNYL (DURAGESIC - DOSED MCG/HR) 12 MCG/HR Place 12.5 mcg onto the skin every 3 (three) days.  . fentaNYL (DURAGESIC - DOSED MCG/HR) 50 MCG/HR Place 50 mcg onto the skin every 3 (three) days.  . hypromellose (SYSTANE OVERNIGHT THERAPY) 0.3 % GEL ophthalmic ointment Place 1 drop into both eyes at bedtime. Reported on 02/11/2015  . lidocaine (LIDODERM) 5 % Place 1 patch onto the skin as needed. Remove & Discard patch within 12 hours. May dispense as 3 month supply  . Lifitegrast (XIIDRA) 5 % SOLN Place 1 drop into both eyes daily.   . Loteprednol Etabonate (LOTEMAX) 0.5 % GEL Place 1 drop into both eyes 2 (two) times daily. Reported on 02/11/2015  . mirabegron ER (MYRBETRIQ) 25 MG TB24 tablet Take 1 tablet (25 mg total) by mouth daily.  . Naloxone HCl 0.4 MG/0.4ML SOAJ Administer 0.4mg  [place] at first sign of opioid overdose and repeat every 2 minutes as needed for resuscitation. Call 911 immediately  . Omega-3 Fatty Acids (SEA-OMEGA PO) Take 1,000 mg by mouth daily.  . ondansetron (ZOFRAN) 8 MG tablet Take 1 tablet (8 mg total) by mouth every 8 (eight) hours as needed for nausea or vomiting.  . pregabalin (LYRICA) 50 MG capsule Take 1 capsule (50 mg total) by  mouth 3 (three) times daily.  . propranolol (INDERAL) 10 MG tablet Take 0.5 tablets (5 mg total) by mouth daily.  Marland Kitchen pyridoxine (B-6) 200 MG tablet Take 200 mg by mouth daily.  . SUMAtriptan (IMITREX) 100 MG tablet Take 1 tablet at earliest onset of headache, may repeat in 2 hours if headache persists or reoccurs. (Patient taking differently: Take 100 mg by mouth every 3 (three) days. )  . traZODone (DESYREL) 100 MG tablet Take 0.5-1 tablets (50-100 mg total) by mouth at bedtime as needed for sleep. And 1/2 -1 if wakes at 4am. (Patient taking differently: Take 25 mg by mouth at bedtime. )  . Vitamin D, Ergocalciferol, (DRISDOL)  50000 units CAPS capsule Take 1 capsule (50,000 Units total) by mouth every 7 (seven) days.  . [DISCONTINUED] gabapentin (NEURONTIN) 300 MG capsule Take 1 capsule (300 mg total) by mouth 2 (two) times daily.   Facility-Administered Encounter Medications as of 10/04/2016  Medication  . cyanocobalamin ((VITAMIN B-12)) injection 1,000 mcg  . cyanocobalamin ((VITAMIN B-12)) injection 1,000 mcg    Allergies (verified) Ambien [zolpidem tartrate]; Codeine; Oxycodone-acetaminophen; Tylox [oxycodone-acetaminophen]; Darvocet [propoxyphene n-acetaminophen]; Darvon [propoxyphene]; Food; Lorcet [hydrocodone-acetaminophen]; Opium; Vicodin [hydrocodone-acetaminophen]; Wheat bran; Zolpidem tartrate; Zolpidem tartrate; Amoxicillin; and Penicillins   History: Past Medical History:  Diagnosis Date  . Allergy   . Anemia    anemia in the past  . Angina    takes Propanolol prn and it stops her chest  . Anxiety   . Barrett esophagus    not consistently present  . Cataract   . Chronic kidney disease    kidney stones and UTI  . Complication of anesthesia    either  BP or pulse dropped with last surgery  . DDD (degenerative disc disease)    cervical and lumbar  . Dementia   . Depression    post traumatic stress  disorder  . Diabetes mellitus    sugar goes up and down diet controlled  . Dysrhythmia    brought on by stress  . External hemorrhoids   . Fatigue   . Fibromyalgia    neuropathy knees ankles and toes, bladder  . GERD (gastroesophageal reflux disease)   . H/O hyperparathyroidism   . Headache(784.0)    migraines  . Hiatal hernia   . History of kidney stones   . Hypertension    3 small brain aneurysms  . IBS (irritable bowel syndrome)   . Internal hemorrhoids   . Macular degeneration   . Migraines   . Osteoarthritis    all over  . Osteopetrosis   . Peripheral vascular disease (HCC)    superficial phlebitis in left calf  . Personal history of colonic polyps 07/22/2013   07/2013 - 3  small adenomas - repeat colonoscopy 2018   Past Surgical History:  Procedure Laterality Date  . ABDOMINAL HYSTERECTOMY    . APPENDECTOMY    . CARDIAC CATHETERIZATION  03/22/1991   EF 61%  . CARDIOVASCULAR STRESS TEST  10/03/2006  . CHOLECYSTECTOMY    . COLONOSCOPY  06/23/2008   normal  . DILATION AND CURETTAGE OF UTERUS     x2  . ESOPHAGUS SURGERY    . EXPLORATORY LAPAROTOMY  1993  . GASTRIC BYPASS  1999  . GASTRIC RESTRICTION SURGERY  1991  . Right Arm Surgery    . Right Knee Arthroscopy    . Rt wrist fx  2009  . stomach stappeling  1991  . STONE EXTRACTION WITH BASKET  2012  .  TONSILLECTOMY    . TUBAL LIGATION    . UPPER GASTROINTESTINAL ENDOSCOPY  06/23/2008   w/Dil, Barrett's esophagus  . US ECHOCARDIOGRAPHY  01/21/2007   EF 55-60%  . US ECHOCARDIOGRAPHY  08/30/2003   EF 55-60%   Family History  Problem Relation Age of Onset  . Stroke Mother   . Lung cancer Mother   . Heart disease Mother   . Diabetes Mother   . Hypertension Father   . Stroke Father   . Lung cancer Father   . Heart disease Father   . Kidney disease Father   . Seizures Father        epilepsy, and sisters x 2  . Pancreatic cancer Sister   . Lung cancer Sister   . Colon cancer Maternal Aunt        12 relatives  . Uterine cancer Unknown        aunt  . Heart disease Unknown        grandmother  . Clotting disorder Sister   . Heart disease Sister        x 2  . Kidney disease Sister        x 2  . Breast cancer Sister   . Colon cancer Paternal Aunt   . Colon cancer Paternal Aunt    Social History   Occupational History  . Disability Retired   Social History Main Topics  . Smoking status: Never Smoker  . Smokeless tobacco: Never Used  . Alcohol use No  . Drug use: No  . Sexual activity: Not Currently    Tobacco Counseling Counseling given: Not Answered   Activities of Daily Living In your present state of health, do you have any difficulty performing the following activities:  10/04/2016  Hearing? N  Vision? N  Difficulty concentrating or making decisions? Y  Comment Hard time remembering things  Walking or climbing stairs? Y  Comment patient has chronic pain and fluid retention in her legs and feet.  Dressing or bathing? N  Doing errands, shopping? N  Preparing Food and eating ? N  Using the Toilet? N  In the past six months, have you accidently leaked urine? Y  Comment Patient sees a urologist for this issue  Do you have problems with loss of bowel control? N  Managing your Medications? N  Managing your Finances? N  Housekeeping or managing your Housekeeping? N  Some recent data might be hidden    Immunizations and Health Maintenance Immunization History  Administered Date(s) Administered  . Influenza Split 10/02/2011  . Influenza Whole 08/21/2007  . Influenza,inj,Quad PF,6+ Mos 12/15/2013, 11/23/2014, 09/27/2015, 10/04/2016  . Pneumococcal Conjugate-13 09/05/2014  . Pneumococcal Polysaccharide-23 10/02/2011, 02/08/2013  . Td 01/14/2006  . Tdap 01/22/2016   There are no preventive care reminders to display for this patient.  Patient Care Team: Shawnee Knapp, MD as PCP - General (Family Medicine) Rainwater, Willey Blade, PA-C as Physician Assistant (Physician Assistant) Felipa Eth, Reece Leader., MD as Referring Physician (Urology) Nahser, Wonda Cheng, MD as Consulting Physician (Cardiology) Calvert Cantor, MD as Consulting Physician (Ophthalmology) Gatha Mayer, MD as Consulting Physician (Gastroenterology) Pieter Partridge, DO as Consulting Physician (Neurology) Bo Merino, MD as Consulting Physician (Rheumatology) Yaakov Guthrie, PsyD (Psychiatry) Richard Miu, DMD (Dentistry) Jola Baptist, [place] as Referring Physician (Chiropractic Medicine)  Indicate any recent Medical Services you may have received from other than [facility] providers in the past year (date may be approximate).     Assessment:   This  is a routine wellness examination for  Zonnie.   Hearing/Vision screen Vision Screening Comments: Patient sees Dr. Bing Plume for her regular eye exams every 3-5 months.   Dietary issues and exercise activities discussed: Current Exercise Habits: The patient does not participate in regular exercise at present, Exercise limited by: orthopedic condition(s)  Goals    . Exercise 2x per week (30 min per time)          Patient states that she wants to try yoga classes to help with her health conditions.       Depression Screen PHQ 2/9 Scores 10/04/2016 08/19/2016 07/19/2016 07/01/2016 06/27/2016 02/26/2016 01/22/2016  PHQ - 2 Score 0 0 0 0 0 0 0  PHQ- 9 Score 13 - - - - - -    Fall Risk Fall Risk  10/04/2016 10/01/2016 08/19/2016 07/19/2016 07/01/2016  Falls in the past year? No No No No No  Comment - - - per pt states she has had almost falls but has caught herself. -  Number falls in past yr: - - - - -  Injury with Fall? - - - - -  Risk Factor Category  - - - - -  Risk for fall due to : - - - - -  Risk for fall due to: Comment - - - - -    Cognitive Function:     6CIT Screen 10/04/2016  What Year? 0 points  What month? 0 points  What time? 0 points  Count back from 20 0 points  Months in reverse 0 points  Repeat phrase 0 points  Total Score 0    Screening Tests Health Maintenance  Topic Date Due  . MAMMOGRAM  02/15/2018  . COLONOSCOPY  03/06/2018  . TETANUS/TDAP  01/21/2026  . INFLUENZA VACCINE  Completed  . DEXA SCAN  Completed  . Hepatitis C Screening  Completed  . PNA vac Low Risk Adult  Completed      Plan:   I have personally reviewed and noted the following in the patient's chart:   . Medical and social history . Use of alcohol, tobacco or illicit drugs  . Current medications and supplements . Functional ability and status . Nutritional status . Physical activity . Advanced directives . List of other physicians . Hospitalizations, surgeries, and ER visits in previous 12 months . Vitals . Screenings to  include cognitive, depression, and falls . Referrals and appointments  In addition, I have reviewed and discussed with patient certain preventive protocols, quality metrics, and best practice recommendations. A written personalized care plan for preventive services as well as general preventive health recommendations were provided to patient.     Andrez Grime, LPN   7/37/1062

## 2016-10-04 NOTE — Patient Instructions (Addendum)
Kayla [name] , Thank you for taking time to come for your Medicare Wellness Visit. I appreciate your ongoing commitment to your health goals. Please review the following plan we discussed and let me know if I can assist you in the future.   Screening recommendations/referrals: Colonoscopy: up to date, next due 03/06/2025 Mammogram: up to date, next due 02/19/2018 Bone Density: up to date, next due 02/15/2021 Recommended yearly ophthalmology/optometry visit for glaucoma screening and checkup Recommended yearly dental visit for hygiene and checkup  Vaccinations: Influenza vaccine: administered today  Pneumococcal vaccine: up to date Tdap vaccine: up to date, next due 01/21/2026 Shingles vaccine: due, check with pharmacy about receiving this vaccine.  Advanced directives: copy in chart  Conditions/risks identified: Try yoga classes to help with your overall health conditions.  Next appointment: 10/05/16 @ 1:20 pm with Dr. Brigitte Pulse   Preventive Care 65 Years and Older, Female Preventive care refers to lifestyle choices and visits with your health care provider that can promote health and wellness. What does preventive care include?  A yearly physical exam. This is also called an annual well check.  Dental exams once or twice a year.  Routine eye exams. Ask your health care provider how often you should have your eyes checked.  Personal lifestyle choices, including:  Daily care of your teeth and gums.  Regular physical activity.  Eating a healthy diet.  Avoiding tobacco and drug use.  Limiting alcohol use.  Practicing safe sex.  Taking low-dose aspirin every day.  Taking vitamin and mineral supplements as recommended by your health care provider. What happens during an annual well check? The services and screenings done by your health care provider during your annual well check will depend on your age, overall health, lifestyle risk factors, and family history of  disease. Counseling  Your health care provider may ask you questions about your:  Alcohol use.  Tobacco use.  Drug use.  Emotional well-being.  Home and relationship well-being.  Sexual activity.  Eating habits.  History of falls.  Memory and ability to understand (cognition).  Work and work Statistician.  Reproductive health. Screening  You may have the following tests or measurements:  Height, weight, and BMI.  Blood pressure.  Lipid and cholesterol levels. These may be checked every 5 years, or more frequently if you are over 107 years old.  Skin check.  Lung cancer screening. You may have this screening every year starting at age 5 if you have a 30-pack-year history of smoking and currently smoke or have quit within the past 15 years.  Fecal occult blood test (FOBT) of the stool. You may have this test every year starting at age 27.  Flexible sigmoidoscopy or colonoscopy. You may have a sigmoidoscopy every 5 years or a colonoscopy every 10 years starting at age 81.  Hepatitis C blood test.  Hepatitis B blood test.  Sexually transmitted disease (STD) testing.  Diabetes screening. This is done by checking your blood sugar (glucose) after you have not eaten for a while (fasting). You may have this done every 1-3 years.  Bone density scan. This is done to screen for osteoporosis. You may have this done starting at age 1.  Mammogram. This may be done every 1-2 years. Talk to your health care provider about how often you should have regular mammograms. Talk with your health care provider about your test results, treatment options, and if necessary, the need for more tests. Vaccines  Your health care provider may recommend certain  vaccines, such as:  Influenza vaccine. This is recommended every year.  Tetanus, diphtheria, and acellular pertussis (Tdap, Td) vaccine. You may need a Td booster every 10 years.  Zoster vaccine. You may need this after age  59.  Pneumococcal 13-valent conjugate (PCV13) vaccine. One dose is recommended after age 59.  Pneumococcal polysaccharide (PPSV23) vaccine. One dose is recommended after age 21. Talk to your health care provider about which screenings and vaccines you need and how often you need them. This information is not intended to replace advice given to you by your health care provider. Make sure you discuss any questions you have with your health care provider. Document Released: 01/27/2015 Document Revised: 09/20/2015 Document Reviewed: 11/01/2014 Elsevier Interactive Patient Education  2017 [place] Prevention in the Home Falls can cause injuries. They can happen to people of all ages. There are many things you can do to make your home safe and to help prevent falls. What can I do on the outside of my home?  Regularly fix the edges of walkways and driveways and fix any cracks.  Remove anything that might make you trip as you walk through a door, such as a raised step or threshold.  Trim any bushes or trees on the path to your home.  Use bright outdoor lighting.  Clear any walking paths of anything that might make someone trip, such as rocks or tools.  Regularly check to see if handrails are loose or broken. Make sure that both sides of any steps have handrails.  Any raised decks and porches should have guardrails on the edges.  Have any leaves, snow, or ice cleared regularly.  Use sand or salt on walking paths during winter.  Clean up any spills in your garage right away. This includes oil or grease spills. What can I do in the bathroom?  Use night lights.  Install grab bars by the toilet and in the tub and shower. Do not use towel bars as grab bars.  Use non-skid mats or decals in the tub or shower.  If you need to sit down in the shower, use a plastic, non-slip stool.  Keep the floor dry. Clean up any water that spills on the floor as soon as it happens.  Remove  soap buildup in the tub or shower regularly.  Attach bath mats securely with double-sided non-slip rug tape.  Do not have throw rugs and other things on the floor that can make you trip. What can I do in the bedroom?  Use night lights.  Make sure that you have a light by your bed that is easy to reach.  Do not use any sheets or blankets that are too big for your bed. They should not hang down onto the floor.  Have a firm chair that has side arms. You can use this for support while you get dressed.  Do not have throw rugs and other things on the floor that can make you trip. What can I do in the kitchen?  Clean up any spills right away.  Avoid walking on wet floors.  Keep items that you use a lot in easy-to-reach places.  If you need to reach something above you, use a strong step stool that has a grab bar.  Keep electrical cords out of the way.  Do not use floor polish or wax that makes floors slippery. If you must use wax, use non-skid floor wax.  Do not have throw rugs and other things  on the floor that can make you trip. What can I do with my stairs?  Do not leave any items on the stairs.  Make sure that there are handrails on both sides of the stairs and use them. Fix handrails that are broken or loose. Make sure that handrails are as long as the stairways.  Check any carpeting to make sure that it is firmly attached to the stairs. Fix any carpet that is loose or worn.  Avoid having throw rugs at the top or bottom of the stairs. If you do have throw rugs, attach them to the floor with carpet tape.  Make sure that you have a light switch at the top of the stairs and the bottom of the stairs. If you do not have them, ask someone to add them for you. What else can I do to help prevent falls?  Wear shoes that:  Do not have high heels.  Have rubber bottoms.  Are comfortable and fit you well.  Are closed at the toe. Do not wear sandals.  If you use a  stepladder:  Make sure that it is fully opened. Do not climb a closed stepladder.  Make sure that both sides of the stepladder are locked into place.  Ask someone to hold it for you, if possible.  Clearly mark and make sure that you can see:  Any grab bars or handrails.  First and last steps.  Where the edge of each step is.  Use tools that help you move around (mobility aids) if they are needed. These include:  Canes.  Walkers.  Scooters.  Crutches.  Turn on the lights when you go into a dark area. Replace any light bulbs as soon as they burn out.  Set up your furniture so you have a clear path. Avoid moving your furniture around.  If any of your floors are uneven, fix them.  If there are any pets around you, be aware of where they are.  Review your medicines with your doctor. Some medicines can make you feel dizzy. This can increase your chance of falling. Ask your doctor what other things that you can do to help prevent falls. This information is not intended to replace advice given to you by your health care provider. Make sure you discuss any questions you have with your health care provider. Document Released: 10/27/2008 Document Revised: 06/08/2015 Document Reviewed: 02/04/2014 Elsevier Interactive Patient Education  2017 [employer].

## 2016-10-05 ENCOUNTER — Ambulatory Visit (INDEPENDENT_AMBULATORY_CARE_PROVIDER_SITE_OTHER): Payer: Medicare Other | Admitting: Family Medicine

## 2016-10-05 ENCOUNTER — Encounter: Payer: Self-pay | Admitting: Family Medicine

## 2016-10-05 ENCOUNTER — Ambulatory Visit (INDEPENDENT_AMBULATORY_CARE_PROVIDER_SITE_OTHER): Payer: Medicare Other

## 2016-10-05 VITALS — BP 106/65 | HR 78 | Temp 98.3°F | Resp 17 | Ht 65.0 in | Wt 113.0 lb

## 2016-10-05 DIAGNOSIS — M25532 Pain in left wrist: Secondary | ICD-10-CM | POA: Diagnosis not present

## 2016-10-05 DIAGNOSIS — G5692 Unspecified mononeuropathy of left upper limb: Secondary | ICD-10-CM | POA: Diagnosis not present

## 2016-10-05 DIAGNOSIS — K602 Anal fissure, unspecified: Secondary | ICD-10-CM | POA: Diagnosis not present

## 2016-10-05 DIAGNOSIS — M4692 Unspecified inflammatory spondylopathy, cervical region: Secondary | ICD-10-CM

## 2016-10-05 DIAGNOSIS — L600 Ingrowing nail: Secondary | ICD-10-CM | POA: Diagnosis not present

## 2016-10-05 DIAGNOSIS — M5030 Other cervical disc degeneration, unspecified cervical region: Secondary | ICD-10-CM

## 2016-10-05 DIAGNOSIS — M47812 Spondylosis without myelopathy or radiculopathy, cervical region: Secondary | ICD-10-CM

## 2016-10-05 MED ORDER — DILTIAZEM GEL 2 %: 1.0000 "application " | Freq: Two times a day (BID) | CUTANEOUS | 2 refills | Status: DC | PRN

## 2016-10-05 MED ORDER — MUPIROCIN 2 % EX OINT: 1.0000 "application " | TOPICAL_OINTMENT | Freq: Three times a day (TID) | CUTANEOUS | 1 refills | Status: DC

## 2016-10-05 MED ORDER — CEPHALEXIN 500 MG PO CAPS: 500.0000 mg | ORAL_CAPSULE | Freq: Three times a day (TID) | ORAL | 0 refills | Status: DC

## 2016-10-05 NOTE — Patient Instructions (Addendum)
Stop cutting the edges of your toenails down. But the edges grow out. Twice a day after soaking your foot in warm water peel back the edges from the nail. Once the nail starts to grow out you can wedge a thin wisp of cotton from a cotton ball underneath the corner to prevent it from taking back in. You could try putting tea tree oil into the water for a natural antibacterial effect.   You can massage the mupirocin around the cuticle to help treat the underlying infection. You should be able to stop this within 2 weeks.  If you continue to have any problems, don't hesitate to make an appointment with one of our physician assistants for toenail removal   IF you received an x-ray today, you will receive an invoice from [facility]. Please contact [facility] at [phone] with questions or concerns regarding your invoice.   IF you received labwork today, you will receive an invoice from [place]. Please contact [facility] at [phone] with questions or concerns regarding your invoice.   Our billing staff will not be able to assist you with questions regarding bills from these companies.  You will be contacted with the lab results as soon as they are available. The fastest way to get your results is to activate your My Chart account. Instructions are located on the last page of this paperwork. If you have not heard from [place] regarding the results in 2 weeks, please contact this office.     Ingrown Toenail An ingrown toenail occurs when the corner or sides of your toenail grow into the surrounding skin. The big toe is most commonly affected, but it can happen to any of your toes. If your ingrown toenail is not treated, you will be at risk for infection. What are the causes? This condition may be caused by:  Wearing shoes that are too small or tight.  Injury or trauma, such as stubbing your toe or having your toe stepped on.  Improper cutting or care of your toenails.  Being  born with (congenital) nail or foot abnormalities, such as having a nail that is too big for your toe.  What increases the risk? Risk factors for an ingrown toenail include:  Age. Your nails tend to thicken as you get older, so ingrown nails are more common in older people.  Diabetes.  Cutting your toenails incorrectly.  Blood circulation problems.  What are the signs or symptoms? Symptoms may include:  Pain, soreness, or tenderness.  Redness.  Swelling.  Hardening of the skin surrounding the toe.  Your ingrown toenail may be infected if there is fluid, pus, or drainage. How is this diagnosed? An ingrown toenail may be diagnosed by medical history and physical exam. If your toenail is infected, your health care provider may test a sample of the drainage. How is this treated? Treatment depends on the severity of your ingrown toenail. Some ingrown toenails may be treated at home. More severe or infected ingrown toenails may require surgery to remove all or part of the nail. Infected ingrown toenails may also be treated with antibiotic medicines. Follow these instructions at home:  If you were prescribed an antibiotic medicine, finish all of it even if you start to feel better.  Soak your foot in warm soapy water for 20 minutes, 3 times per day or as directed by your health care provider.  Carefully lift the edge of the nail away from the sore skin by wedging a small piece of  cotton under the corner of the nail. This may help with the pain. Be careful not to cause more injury to the area.  Wear shoes that fit well. If your ingrown toenail is causing you pain, try wearing sandals, if possible.  Trim your toenails regularly and carefully. Do not cut them in a curved shape. Cut your toenails straight across. This prevents injury to the skin at the corners of the toenail.  Keep your feet clean and dry.  If you are having trouble walking and are given crutches by your health care  provider, use them as directed.  Do not pick at your toenail or try to remove it yourself.  Take medicines only as directed by your health care provider.  Keep all follow-up visits as directed by your health care provider. This is important. Contact a health care provider if:  Your symptoms do not improve with treatment. Get help right away if:  You have red streaks that start at your foot and go up your leg.  You have a fever.  You have increased redness, swelling, or pain.  You have fluid, blood, or pus coming from your toenail. This information is not intended to replace advice given to you by your health care provider. Make sure you discuss any questions you have with your health care provider. Document Released: 12/29/1999 Document Revised: 06/02/2015 Document Reviewed: 11/24/2013 Elsevier Interactive Patient Education  [employer].

## 2016-10-05 NOTE — Progress Notes (Signed)
Subjective:    Patient ID: Kayla [name], female    DOB: [birth year], 71 y.o.   MRN: [id number] Chief Complaint  Patient presents with  . Follow-up    L wrist pain-sharp pain, Referral RA,   . Nail Problem    HPI  [facility] EMG/NCV sched for 10/17/16 at 1:30  Used to be just hypoglycemic - never more than 110-120 but for the past several years has been spiking up to 400s at times and then dropping down to 40s. Had a dietician tell her to gain weight and do high carb diet which wouldn't work for her.  Has appt with [facility] next week and sleep study is 24th.  When she gets constipated, has to put pressure above her anus and tears - gets fissures - used gel prior which worked well.  Left ingrown toenail got infected.   Has ingrown toenail on B large toes but worries that right is infected as has become very red, tender, and skin developed a hard dark place at the corner. Has been tryiing to cut the edges of her nails down more. Can't wear close toe shoes do to the pain.  Has not done anything/tried anything yet as didn't know what to do.  Pain mngmnt Dr - Sherilyn Cooter - telling her she needs to restart PT, massage, go to chiropractor (which is out of pocket cost, not ocvered by insurance) and restart yoga and other exercises so is going to do it as she was told she had to but worried about cost - already in debt $96K and still has more going out than coming in - reports he told her that if she is really  Motivated to improve her headache syndrome, than she will figure out a way to pay for this but she feels stuck and lost as to what exactly she is supposed to give up - fresh fruits and veggies? Has done those things prior and sometimes they help but has also had a few bad experiences so would like medical recommendations on who exactly would be good to work w/ her. Happy that her fetanyl patch was decreased - stopped the extra 12 mcg one so now will just be on the 50  mcg.  Restarted some prn hydrocodone which works better for her than the fetanyl. TENS unit isn't working as well - keeps going out. Has had it for a very long time. Did have recent cervical MRI - pain dr rec left arm EMG due to her severe Lt radiculopathy sxs which started 08/2016  Neurology Dr. Tana Felts took to pt off gabapentin and started her on lyrica which does seem to be a little better pt reports.  Review of Systems  Constitutional: Negative for activity change, appetite change, fever and unexpected weight change.  Cardiovascular: Positive for leg swelling.  Gastrointestinal: Positive for anal bleeding, constipation and rectal pain. Negative for blood in stool, diarrhea and vomiting.  Musculoskeletal: Positive for arthralgias, back pain, gait problem, joint swelling, myalgias, neck pain and neck stiffness.  Skin: Positive for color change. Negative for pallor and rash.  Neurological: Positive for headaches. Negative for weakness.   See hpi    Objective:   Physical Exam   Wearing TENS unit in office - attached to neck - appears to be quite old.    BP 106/65   Pulse 78   Temp 98.3 F (36.8 C) (Oral)   Resp 17   Ht '5\' 5"'  (1.651 m)   Wt  113 lb (51.3 kg)   SpO2 97%   BMI 18.80 kg/m   eGFR >60  Dg Chest 2 View  Result Date: 09/12/2016 CLINICAL DATA:  Left-sided chest pain. EXAM: CHEST  2 VIEW COMPARISON:  08/19/2016 FINDINGS: Cardiomediastinal silhouette is normal. Mediastinal contours appear intact. Tortuosity of the aorta. There is no evidence of focal airspace consolidation, pleural effusion or pneumothorax. Mild hyperinflation of the lungs with suspected upper lobe predominant emphysematous changes. Osseous structures are without acute abnormality. Soft tissues are grossly normal. Postsurgical changes in the left upper abdomen. IMPRESSION: Mild hyperinflation of the lungs with suspected upper lobe predominant emphysematous changes. Electronically Signed   By: Fidela Salisbury M.D.   On: 09/12/2016 16:28   Dg Wrist Complete Left  Result Date: 10/05/2016 CLINICAL DATA:  Left wrist pain for several weeks, no known injury, initial encounter EXAM: LEFT WRIST - COMPLETE 3+ VIEW COMPARISON:  None. FINDINGS: There is no evidence of fracture or dislocation. There is no evidence of arthropathy or other focal bone abnormality. Soft tissues are unremarkable. IMPRESSION: No acute abnormality noted. Electronically Signed   By: Inez Catalina M.D.   On: 10/05/2016 14:33    Imaging results from [facility]:   MRI cervical spine without contrast on October 08, 2006:  Normal cervical alignment. No fracture is seen. The cord has normal signal.  C2-3: Small central disc protrusion without cord deformity.  C3-4: Small central disc protrusion without cord deformity.  C4-5: Small central disc protrusion without spinal stenosis or cord deformity.  C5-6: Moderate disc degeneration and spondylosis. Mild spurring is effacing the ventral subarachnoid space but not causing significant cord deformity. There is mild left foraminal encroachment.  C6-7: Disc degeneration and spurring with mild biforaminal narrowing.  C7-T1: Negative.    MRI cervical spine June 2018 C4-5: Bilateral facet degeneration left greater than right. Mild disc degeneration. Left foraminal narrowing due to spurring. C5-6: Disc degeneration and spondylosis with diffuse uncinate spurring left greater than right. Bilateral facet hypertrophy. Mild left foraminal narrowing C6-7: Disc degeneration and spurring on the left causing mild left foraminal narrowing. C7-T1: Mild disc degeneration without stenosis CONCLUSION: Disc degeneration and facet degeneration is most prominent on the left at C4-5, C5-6, and C6-7 causing mild left foraminal narrowing at these levels.   Assessment & Plan:  Advised pt not to undergo chiropractor treatment due to concerns of poss spinal nerve compression.  Can't afford out  of pocket massage at this point so hold off.  Try yoga at home. Gave hard rx for new TENS unit. I think pt would highly benefit from craniosacral therapy but unfortunately the out of pocket cost is again a sig barrier.  EMG scheduled by neurologist for lower extremities and need to add left arm. severe left distal arm pain with onset august 2018 - primarily around radial aspect of Lt wrist - XR nml - will check labs for inflammatory arthropathy per pt request but no sxs of this and no signs of inflammation on exam, exam reveals sig pain but seems to have nml strength. Pt's pain mnmgnt provider who is through [facility] rec she have a EMG/NCV of her left upper ext for her complaints since recent c-spine MRI did show diffuse deg changes/foraminal narrowing/spurring etc most along Lt C4-7 but no definitive area of nerve impingement.     1. Pain in joint of left wrist - xray nml, no inflammatory sxs consistent w/ RA, placed in thumb spica splint due to worsening sxs and ttp  over anatomic snufbox and will check NCV/EMG per pain specialsist rec. If sxs cont after 1-2 wks of immobilization and rheum labs neg, rec referral to ortho for further eval.  2. Anal fissure - pt feels recurrence, refilled dilt get which has worked well for her prior  3. Ingrown toenail of left foot with infection - warm soaks sev x/d, pull back skin around nail, topical mupirocin, stop cutting nail edges. Complete course of oral keflex. If worsens, RTC for appt with PA for wedge resection.  4. DISC DISEASE, CERVICAL   5. Neuropathy, arm, left   6.      Cervical facet joint syndrome - w/ cervicogenic headaches - seeing Sherilyn Cooter at [facility] in [place] This Encounter  Procedures  . DG Wrist Complete Left    Standing Status:   Future    Number of Occurrences:   1    Standing Expiration Date:   10/05/2017    Order Specific Question:   Reason for Exam (SYMPTOM  OR DIAGNOSIS REQUIRED)    Answer:   ttp  over radial aspect and anatomic snuffbox worsening for sev wks, ?RA    Order Specific Question:   Preferred imaging location?    Answer:   External  . Sedimentation Rate  . Uric A+ANA+RA Qn+CRP+ASO  . CYCLIC CITRUL PEPTIDE ANTIBODY, IGG/IGA  . CYCLIC CITRUL PEPTIDE ANTIBODY, IGG/IGA  . NCV with EMG(electromyography)    Standing Status:   Future    Standing Expiration Date:   10/05/2017    Scheduling Instructions:     Please do at same time as the B LExt NCV/EMG pt is sched for at [facility] 10/17/16 at 1:30 pm which was ordered by Dr. Metta Clines, thanks!    Order Specific Question:   Where should this test be performed?    Answer:   [facility]    Meds ordered this encounter  Medications  . IRON PO    Sig: Take by mouth.  . diltiazem 2 % GEL    Sig: Apply 1 application topically 2 (two) times daily as needed (pain from fissure).    Dispense:  30 g    Refill:  2  . mupirocin ointment (BACTROBAN) 2 %    Sig: Apply 1 application topically 3 (three) times daily.    Dispense:  30 g    Refill:  1  . cephALEXin (KEFLEX) 500 MG capsule    Sig: Take 1 capsule (500 mg total) by mouth 3 (three) times daily.    Dispense:  30 capsule    Refill:  0     [name], M.D.  Primary Care at [facility] [address] [place],  [address] [phone] phone [phone] fax  10/08/16 12:01 PM

## 2016-10-07 ENCOUNTER — Telehealth: Payer: Self-pay

## 2016-10-07 LAB — URIC A+ANA+RA QN+CRP+ASO
ANA: NEGATIVE
ASO: 58 [IU]/mL (ref 0.0–200.0)
Uric Acid: 3.8 mg/dL (ref 2.5–7.1)

## 2016-10-07 LAB — CYCLIC CITRUL PEPTIDE ANTIBODY, IGG/IGA: CYCLIC CITRULLIN PEPTIDE AB: 6 U (ref 0–19)

## 2016-10-07 LAB — SEDIMENTATION RATE: Sed Rate: 2 mm/hr (ref 0–40)

## 2016-10-07 NOTE — Telephone Encounter (Signed)
Rec'd from [place] [facility] forwarded 24 pages to Silvano Rusk E MD

## 2016-10-07 NOTE — Telephone Encounter (Signed)
-----   Message from Pieter Partridge, DO sent at 10/04/2016  1:30 PM EDT ----- B6 is normal

## 2016-10-07 NOTE — Telephone Encounter (Signed)
Called and spoke with Pt advising her of normal labs.

## 2016-10-08 DIAGNOSIS — M47812 Spondylosis without myelopathy or radiculopathy, cervical region: Secondary | ICD-10-CM

## 2016-10-08 DIAGNOSIS — K602 Anal fissure, unspecified: Secondary | ICD-10-CM | POA: Insufficient documentation

## 2016-10-08 HISTORY — DX: Anal fissure, unspecified: K60.2

## 2016-10-08 HISTORY — DX: Spondylosis without myelopathy or radiculopathy, cervical region: M47.812

## 2016-10-15 ENCOUNTER — Ambulatory Visit: Payer: Medicare Other | Admitting: Psychiatry

## 2016-10-15 NOTE — Telephone Encounter (Signed)
Done

## 2016-10-17 ENCOUNTER — Ambulatory Visit (INDEPENDENT_AMBULATORY_CARE_PROVIDER_SITE_OTHER): Payer: Medicare Other | Admitting: Neurology

## 2016-10-17 DIAGNOSIS — G629 Polyneuropathy, unspecified: Secondary | ICD-10-CM

## 2016-10-17 NOTE — Procedures (Addendum)
[facility]  [address]  [place], [place] [address] Tel: [phone] Fax:  [phone] Test Date:  10/17/2016  Patient: Kayla [name] DOB: [birth year] Physician: Narda Amber, DO  Sex: Female Height: 5\' 5"  Ref Phys: Metta Clines, DO  ID#: [id number] Temp: 33.0C Technician:    Patient Complaints: This is a 71 year old female referred for evaluation of generalized numbness and tingling.  NCV & EMG Findings: Extensive electrodiagnostic testing of the left upper and lower extremity shows:  1. Left median sensory response shows mildly prolonged latency (4.0 ms) and normal amplitude. Left ulnar sensory response is within normal limits. 2. Left sural and superficial peroneal sensory responses are absent. 3. Left median and ulnar motor responses are within normal limits. 4. Left peroneal and tibial motor responses show mildly reduced amplitude and slowed conduction velocity. The peroneal motor response at the tibialis anterior is within normal limits. 5. Left tibial H reflex study is absent. 6. There is no evidence of active or chronic motor axon loss changes affecting any of the tested muscles. Motor unit configuration and recruitment pattern is within normal limits.  Impression: 1. The electrophysiologic findings are most consistent with a chronic sensorimotor polyneuropathy affecting the left lower extremity, predominantly axon loss in type. 2. Left median neuropathy at or distal to the wrist, consistent with the clinical diagnosis of carpal tunnel syndrome. Overall, these findings are mild in degree electrically.   ___________________________ Narda Amber, DO    Nerve Conduction Studies Anti Sensory Summary Table   Site NR Peak (ms) Norm Peak (ms) P-T Amp (V) Norm P-T Amp  Left Median Anti Sensory (2nd Digit)  Wrist    4.0 <3.8 16.7 >10  Left Sup Peroneal Anti Sensory (Ant Lat Mall)  12 cm NR  <4.6  >3  Left Sural Anti Sensory (Lat Mall)  Calf NR  <4.6   >3  Left Ulnar Anti Sensory (5th Digit)  Wrist    3.1 <3.2 13.5 >5   Motor Summary Table   Site NR Onset (ms) Norm Onset (ms) O-P Amp (mV) Norm O-P Amp Site1 Site2 Delta-0 (ms) Dist (cm) Vel (m/s) Norm Vel (m/s)  Left Median Motor (Abd Poll Brev)  Wrist    3.2 <4.0 9.5 >5 Elbow Wrist 5.6 28.0 50 >50  Elbow    8.8  8.1         Left Peroneal Motor (Ext Dig Brev)  Ankle    4.1 <6.0 1.3 >2.5 B Fib Ankle 9.5 37.0 39 >40  B Fib    13.6  1.2  Poplt B Fib 2.3 9.0 39 >40  Poplt    15.9  1.2         Left Peroneal TA Motor (Tib Ant)  Fib Head    3.8 <4.5 3.6 >3 Poplit Fib Head 1.4 8.0 57 >40  Poplit    5.2  3.5         Left Tibial Motor (Abd Hall Brev)  Ankle    5.1 <6.0 3.3 >4 Knee Ankle 10.3 38.0 37 >40  Knee    15.4  1.6         Left Ulnar Motor (Abd Dig Minimi)  Wrist    2.6 <3.1 7.5 >7 B Elbow Wrist 3.9 23.0 59 >50  B Elbow    6.5  6.9  A Elbow B Elbow 2.0 10.0 50 >50  A Elbow    8.5  6.6          H Reflex Studies  NR H-Lat (ms) Lat Norm (ms) L-R H-Lat (ms)  Left Tibial (Gastroc)  NR  <35    EMG   Side Muscle Ins Act Fibs Psw Fasc Number Recrt Dur Dur. Amp Amp. Poly Poly. Comment  Left AntTibialis Nml Nml Nml Nml Nml Nml Nml Nml Nml Nml Nml Nml N/A  Left Gastroc Nml Nml Nml Nml Nml Nml Nml Nml Nml Nml Nml Nml N/A  Left Flex Dig Long Nml Nml Nml Nml Nml Nml Nml Nml Nml Nml Nml Nml N/A  Left RectFemoris Nml Nml Nml Nml Nml Nml Nml Nml Nml Nml Nml Nml N/A  Left GluteusMed Nml Nml Nml Nml Nml Nml Nml Nml Nml Nml Nml Nml N/A  Left LumbarPSP Nml Nml Nml Nml Nml Nml Nml Nml Nml Nml Nml Nml N/A  Left BicepsFemS Nml Nml Nml Nml Nml Nml Nml Nml Nml Nml Nml Nml N/A  Left 1stDorInt Nml Nml Nml Nml Nml Nml Nml Nml Nml Nml Nml Nml N/A  Left Ext Indicis Nml Nml Nml Nml Nml Nml Nml Nml Nml Nml Nml Nml N/A  Left PronatorTeres Nml Nml Nml Nml Nml Nml Nml Nml Nml Nml Nml Nml N/A  Left Biceps Nml Nml Nml Nml Nml Nml Nml Nml Nml Nml Nml Nml N/A  Left Triceps Nml Nml Nml Nml Nml Nml Nml Nml Nml  Nml Nml Nml N/A  Left Deltoid Nml Nml Nml Nml Nml Nml Nml Nml Nml Nml Nml Nml N/A      Waveforms:

## 2016-10-21 ENCOUNTER — Telehealth: Payer: Self-pay | Admitting: Neurology

## 2016-10-21 NOTE — Telephone Encounter (Signed)
Called [facility], talked to [place], gave her Rx for Lyrica #90 1 TID w/3 refills

## 2016-10-21 NOTE — Telephone Encounter (Signed)
Pt called and said her prescription for [place] has not been called in

## 2016-10-22 ENCOUNTER — Telehealth: Payer: Self-pay | Admitting: Neurology

## 2016-10-22 NOTE — Telephone Encounter (Signed)
Kayla [name] called needing to see if Dr. Tomi Likens could call in a 1 month supply of her medication instead of 3 month. She said the 3 month is too expensive. Also could it be called in through mail order/ electronic trough [place]? Please Advise. Thanks

## 2016-10-23 ENCOUNTER — Other Ambulatory Visit: Payer: Self-pay

## 2016-10-23 MED ORDER — PREGABALIN 50 MG PO CAPS: 50.0000 mg | ORAL_CAPSULE | Freq: Three times a day (TID) | ORAL | 2 refills | Status: DC

## 2016-10-23 NOTE — Telephone Encounter (Signed)
Spoke with Pt, advsd her the Rx I sent in was for a 30 day supply. The amout of #90 was due to her taking TID. Pt rqstd I send further refills to [facility] order

## 2016-10-24 ENCOUNTER — Encounter: Payer: Self-pay | Admitting: Family Medicine

## 2016-10-24 ENCOUNTER — Ambulatory Visit (INDEPENDENT_AMBULATORY_CARE_PROVIDER_SITE_OTHER): Payer: Medicare Other | Admitting: Family Medicine

## 2016-10-24 VITALS — BP 90/70 | HR 80 | Temp 98.5°F | Resp 18 | Ht 65.0 in | Wt 115.0 lb

## 2016-10-24 DIAGNOSIS — G629 Polyneuropathy, unspecified: Secondary | ICD-10-CM

## 2016-10-24 DIAGNOSIS — E611 Iron deficiency: Secondary | ICD-10-CM | POA: Diagnosis not present

## 2016-10-24 DIAGNOSIS — G5602 Carpal tunnel syndrome, left upper limb: Secondary | ICD-10-CM

## 2016-10-24 DIAGNOSIS — I959 Hypotension, unspecified: Secondary | ICD-10-CM

## 2016-10-24 DIAGNOSIS — R110 Nausea: Secondary | ICD-10-CM | POA: Diagnosis not present

## 2016-10-24 DIAGNOSIS — R1110 Vomiting, unspecified: Secondary | ICD-10-CM

## 2016-10-24 MED ORDER — PREGABALIN 50 MG PO CAPS: 50.0000 mg | ORAL_CAPSULE | Freq: Three times a day (TID) | ORAL | 0 refills | Status: DC

## 2016-10-24 NOTE — Progress Notes (Addendum)
Subjective:    Patient ID: Kayla [name], female    DOB: 02-26-1945, 71 y.o.   MRN: [id number] Chief Complaint  Patient presents with  . Hypotension    pt states she her BP once in a while  . Iron  . Other    Pt states her appetite is fine but everytime she eats she feels nausous.  . Follow-up    3 month follow up, Pt would like samples of Lyrica    HPI  Had EMG/[facility] which showed Left lower ext chronic sensorimotor polyneuropathy - axon losstype. CTS in left median neuropathy - mild. She was placed in a thumb spica splint of the left due to ttp over anatomic snufbox/radial aspect.  Dr. Tomi Likens started her on lyrica tid rayther than gabapentin which is working better.  Ferritin checked 4 mos prior was low at 21. Pt was rec to start on an iron supp for 2-3 months.  Husband is a [place] vet and her babies were born premature and past. She went to [place] x 3 wk in April 1996 - she got Dengue fever x3wks after she brushed her teeth w/ the water.  Has been sick almost since.  Wonders if she could get Agent Orange exposure from exposure to her husband, could be exposed to chemical agents through contact.  Laverna Peace does go the [place].  She is wonders about getting skin tissue testing. He has "[employer]" and don't even treat. He does have an "Agent [employer] connection.   Endocrine visit is 10/22  Bilateral ankles, toes, turn with excrutiating pain and tingles/nerves.  Dr. Tomi Likens doesn't know what is causing it.   Thanks she might need to go back on the Dr. Orvan Seen low carb/high protein diet but will wait to after the endocrinology appt.    Ran out of lyrica samples sev d ago so temporarly resumed gabapentin.   Taking 2 tabs a day of iron and taking colon cleanse to keep bowels reg.   Has to get esophagus and UTI dilated every few yrs.  Feels like food is getting caught in it again - has to gag herself to throw up the cause food.     cymbalta stopped in 9/7 by pt preference.  Edema  continuing  Past Medical History:  Diagnosis Date  . Allergy   . Anemia    anemia in the past  . Angina    takes Propanolol prn and it stops her chest  . Anxiety   . Barrett esophagus    not consistently present  . Cataract   . Chronic kidney disease    kidney stones and UTI  . Complication of anesthesia    either  BP or pulse dropped with last surgery  . DDD (degenerative disc disease)    cervical and lumbar  . Dementia   . Depression    post traumatic stress  disorder  . Diabetes mellitus    sugar goes up and down diet controlled  . Dysrhythmia    brought on by stress  . External hemorrhoids   . Fatigue   . Fibromyalgia    neuropathy knees ankles and toes, bladder  . GERD (gastroesophageal reflux disease)   . H/O hyperparathyroidism   . Headache(784.0)    migraines  . Hiatal hernia   . History of kidney stones   . Hypertension    3 small brain aneurysms  . IBS (irritable bowel syndrome)   . Internal hemorrhoids   . Macular degeneration   .  Migraines   . Osteoarthritis    all over  . Osteopetrosis   . Peripheral vascular disease (HCC)    superficial phlebitis in left calf  . Personal history of colonic polyps 07/22/2013   07/2013 - 3 small adenomas - repeat colonoscopy 2018   Past Surgical History:  Procedure Laterality Date  . ABDOMINAL HYSTERECTOMY    . APPENDECTOMY    . CARDIAC CATHETERIZATION  03/22/1991   EF 61%  . CARDIOVASCULAR STRESS TEST  10/03/2006  . CHOLECYSTECTOMY    . COLONOSCOPY  06/23/2008   normal  . DILATION AND CURETTAGE OF UTERUS     x2  . ESOPHAGUS SURGERY    . EXPLORATORY LAPAROTOMY  1993  . GASTRIC BYPASS  1999  . GASTRIC RESTRICTION SURGERY  1991  . Right Arm Surgery    . Right Knee Arthroscopy    . Rt wrist fx  2009  . stomach stappeling  1991  . STONE EXTRACTION WITH BASKET  2012  . TONSILLECTOMY    . TUBAL LIGATION    . UPPER GASTROINTESTINAL ENDOSCOPY  06/23/2008   w/Dil, Barrett's esophagus  . US ECHOCARDIOGRAPHY   01/21/2007   EF 55-60%  . US ECHOCARDIOGRAPHY  08/30/2003   EF 55-60%   Current Outpatient Prescriptions on File Prior to Visit  Medication Sig Dispense Refill  . Ascorbic Acid (VITAMIN C) 1000 MG tablet Take 1,000 mg by mouth daily.    Marland Kitchen aspirin-acetaminophen-caffeine (EXCEDRIN MIGRAINE) 250-250-65 MG tablet Take 2 tablets by mouth 2 (two) times daily.    . beta carotene w/minerals (OCUVITE) tablet Take 1 tablet by mouth 2 (two) times daily.    Marland Kitchen CALCIUM-MAGNESIUM-ZINC PO Take by mouth daily.    . cephALEXin (KEFLEX) 500 MG capsule Take 1 capsule (500 mg total) by mouth 3 (three) times daily. 30 capsule 0  . Cholecalciferol (VITAMIN D3) 2000 UNITS TABS Take 2,000 Units by mouth daily.    . clonazePAM (KLONOPIN) 1 MG tablet Take 0.5-1 tablets (0.5-1 mg total) by mouth 2 (two) times daily as needed. anxiety/panic attacks. May fill on or after March 2018. This is a 3 month supply 180 tablet 1  . Cyanocobalamin (VITAMIN B-12) 1000 MCG SUBL Place 5,000 mcg under the tongue daily.     . cyclobenzaprine (FLEXERIL) 10 MG tablet Take 1 tablet (10 mg total) by mouth 3 (three) times daily as needed for muscle spasms. 270 tablet 1  . cycloSPORINE (RESTASIS) 0.05 % ophthalmic emulsion 2 (two) times daily.    . diclofenac sodium (VOLTAREN) 1 % GEL Apply 2 g topically 4 (four) times daily. (Patient taking differently: Apply 2 g topically as needed (pain). ) 700 g 1  . dicyclomine (BENTYL) 10 MG capsule Take 1-2 tab 3 times daily AC as needed for spasms and cramping. 270 capsule 1  . diltiazem 2 % GEL Apply 1 application topically 2 (two) times daily as needed (pain from fissure). 30 g 2  . docusate sodium (COLACE) 250 MG capsule Take 250 mg by mouth daily.    Marland Kitchen esomeprazole (NEXIUM) 40 MG capsule Take 1 capsule (40 mg total) by mouth daily before breakfast. 90 capsule 1  . fentaNYL (DURAGESIC - DOSED MCG/HR) 50 MCG/HR Place 50 mcg onto the skin every 3 (three) days.    . hypromellose (SYSTANE OVERNIGHT  THERAPY) 0.3 % GEL ophthalmic ointment Place 1 drop into both eyes at bedtime. Reported on 02/11/2015    . IRON PO Take by mouth.    . lidocaine (LIDODERM) 5 %  Place 1 patch onto the skin as needed. Remove & Discard patch within 12 hours. May dispense as 3 month supply 90 patch 3  . Lifitegrast (XIIDRA) 5 % SOLN Place 1 drop into both eyes daily.     . Loteprednol Etabonate (LOTEMAX) 0.5 % GEL Place 1 drop into both eyes 2 (two) times daily. Reported on 02/11/2015    . mirabegron ER (MYRBETRIQ) 25 MG TB24 tablet Take 1 tablet (25 mg total) by mouth daily. 90 tablet 1  . mupirocin ointment (BACTROBAN) 2 % Apply 1 application topically 3 (three) times daily. 30 g 1  . Naloxone HCl 0.4 MG/0.4ML SOAJ Administer 0.4mg  [place] at first sign of opioid overdose and repeat every 2 minutes as needed for resuscitation. Call 911 immediately 5 Package 0  . Omega-3 Fatty Acids (SEA-OMEGA PO) Take 1,000 mg by mouth daily.    . ondansetron (ZOFRAN) 8 MG tablet Take 1 tablet (8 mg total) by mouth every 8 (eight) hours as needed for nausea or vomiting. 20 tablet 0  . pregabalin (LYRICA) 50 MG capsule Take 1 capsule (50 mg total) by mouth 3 (three) times daily. 90 capsule 2  . propranolol (INDERAL) 10 MG tablet Take 0.5 tablets (5 mg total) by mouth daily.    Marland Kitchen pyridoxine (B-6) 200 MG tablet Take 200 mg by mouth daily.    . SUMAtriptan (IMITREX) 100 MG tablet Take 1 tablet at earliest onset of headache, may repeat in 2 hours if headache persists or reoccurs. (Patient taking differently: Take 100 mg by mouth every 3 (three) days. ) 30 tablet 1  . traZODone (DESYREL) 100 MG tablet Take 0.5-1 tablets (50-100 mg total) by mouth at bedtime as needed for sleep. And 1/2 -1 if wakes at 4am. (Patient taking differently: Take 25 mg by mouth at bedtime. ) 90 tablet 1  . Vitamin D, Ergocalciferol, (DRISDOL) 50000 units CAPS capsule Take 1 capsule (50,000 Units total) by mouth every 7 (seven) days. 12 capsule 0  . [DISCONTINUED] buPROPion  (WELLBUTRIN) 75 MG tablet Take 75 mg by mouth daily after breakfast.      No current facility-administered medications on file prior to visit.    Allergies  Allergen Reactions  . Ambien [Zolpidem Tartrate]     Hallucinations  . Codeine Anaphylaxis  . Oxycodone-Acetaminophen Shortness Of Breath  . Tylox [Oxycodone-Acetaminophen] Anaphylaxis    Chest pain  . Darvocet [Propoxyphene N-Acetaminophen]     Chest pain  . Darvon [Propoxyphene] Itching  . Food Swelling    Peanuts-wheat-  . Lorcet [Hydrocodone-Acetaminophen]     Chest pain  . Opium     hallucinations  . Vicodin [Hydrocodone-Acetaminophen] Other (See Comments)    Chest Pain   . Wheat Bran   . Amoxicillin Nausea And Vomiting    Reaction:  Migraine headache  . Penicillins Nausea And Vomiting    Migraine Headaches   Family History  Problem Relation Age of Onset  . Stroke Mother   . Lung cancer Mother   . Heart disease Mother   . Diabetes Mother   . Hypertension Father   . Stroke Father   . Lung cancer Father   . Heart disease Father   . Kidney disease Father   . Seizures Father        epilepsy, and sisters x 2  . Pancreatic cancer Sister   . Lung cancer Sister   . Colon cancer Maternal Aunt        12 relatives  . Uterine cancer Unknown  aunt  . Heart disease Unknown        grandmother  . Clotting disorder Sister   . Heart disease Sister        x 2  . Kidney disease Sister        x 2  . Breast cancer Sister   . Colon cancer Paternal Aunt   . Colon cancer Paternal Aunt    Social History   Social History  . Marital status: Married    Spouse name: Rushie Chestnut.  . Number of children: 2  . Years of education: N/A   Occupational History  . Disability Retired   Social History Main Topics  . Smoking status: Never Smoker  . Smokeless tobacco: Never Used  . Alcohol use No  . Drug use: No  . Sexual activity: Not Currently   Other Topics Concern  . None   Social History Narrative   She retired  on disability   Married   2 Children   Depression screen [facility] 2/9 10/24/2016 10/04/2016 08/19/2016 07/19/2016 07/01/2016  Decreased Interest 0 0 0 0 0  Down, Depressed, Hopeless 0 0 0 0 0  PHQ - 2 Score 0 0 0 0 0  Altered sleeping - 3 - - -  Tired, decreased energy - 3 - - -  Change in appetite - 3 - - -  Feeling bad or failure about yourself  - 1 - - -  Trouble concentrating - 3 - - -  Moving slowly or fidgety/restless - 0 - - -  Suicidal thoughts - 0 - - -  PHQ-9 Score - 13 - - -  Difficult doing work/chores - Somewhat difficult - - -  Some recent data might be hidden     Review of Systems See hpi    Objective:   Physical Exam  Constitutional: She is oriented to person, place, and time. She appears well-developed and well-nourished. No distress.  HENT:  Head: Normocephalic and atraumatic.  Right Ear: External ear normal.  Left Ear: External ear normal.  Eyes: Conjunctivae are normal. No scleral icterus.  Neck: Normal range of motion. Neck supple. No thyromegaly present.  Cardiovascular: Normal rate, regular rhythm, normal heart sounds and intact distal pulses.   Pulmonary/Chest: Effort normal and breath sounds normal. No respiratory distress.  Musculoskeletal: She exhibits no edema.  Lymphadenopathy:    She has no cervical adenopathy.  Neurological: She is alert and oriented to person, place, and time.  Skin: Skin is warm and dry. She is not diaphoretic. No erythema.  Psychiatric: She has a normal mood and affect. Her behavior is normal.      BP 90/70 (BP Location: Left Arm, Patient Position: Sitting, Cuff Size: Normal)   Pulse 80   Temp 98.5 F (36.9 C) (Oral)   Resp 18   Ht 5\' 5"  (1.651 m)   Wt 115 lb (52.2 kg)   SpO2 99%   BMI 19.14 kg/m   Orthostatic VS negative/normal. Lying 104/66 HR75 Sitting 99/64 HR 73 Standing at 0 min 104/66 HR 82 Assessment & Plan:  Exposure to chemical agents - oncology Ferritin w/ next labs.  Consider infectious disease referral  for dengue fever hx.    Greater than 50% of the 40 minute visit was spent in counseling/coordination of care regarding how we might eval potential agent orange etiology and whether it is possible that her wide variety of sxs could be due to agent orange exposure to pt through sexual intercourse with her husband after  he was exposed which pt reported cases of occurring prior.  1. Hypotension, unspecified hypotension type   2. Nausea without vomiting   3. Neuropathy   4. Left carpal tunnel syndrome   5. Regurgitation of food   6. Iron deficiency     Orders Placed This Encounter  Procedures  . DG Esophagus W/Water Sol CM    Standing Status:   Future    Standing Expiration Date:   12/24/2017    Order Specific Question:   Reason for Exam (SYMPTOM  OR DIAGNOSIS REQUIRED)    Answer:   food getting stuck, aspiration, regurgitations, h/o hiatal hernia and esophageal stricture    Order Specific Question:   Preferred imaging location?    Answer:   [id number]    Order Specific Question:   Radiology Contrast Protocol - do NOT remove file path    Answer:   [email or web address]  . Ferritin    Standing Status:   Future    Standing Expiration Date:   10/24/2017  . Orthostatic vital signs    Meds ordered this encounter  Medications  . pregabalin (LYRICA) 50 MG capsule    Sig: Take 1 capsule (50 mg total) by mouth 3 (three) times daily.    Dispense:  270 capsule    Refill:  0    Please fax to [facility]: [phone]    [name], M.D.  [facility] [address] [place], [place] [address] [phone] phone [phone] fax  10/26/16 5:02 AM

## 2016-10-24 NOTE — Patient Instructions (Signed)
     IF you received an x-ray today, you will receive an invoice from [facility]. Please contact [facility] at [phone] with questions or concerns regarding your invoice.   IF you received labwork today, you will receive an invoice from [facility]. Please contact [facility] at [phone] with questions or concerns regarding your invoice.   Our billing staff will not be able to assist you with questions regarding bills from these companies.  You will be contacted with the lab results as soon as they are available. The fastest way to get your results is to activate your My Chart account. Instructions are located on the last page of this paperwork. If you have not heard from us regarding the results in 2 weeks, please contact this office.     

## 2016-10-29 ENCOUNTER — Ambulatory Visit (INDEPENDENT_AMBULATORY_CARE_PROVIDER_SITE_OTHER): Payer: Medicare Other | Admitting: Psychiatry

## 2016-10-29 DIAGNOSIS — F331 Major depressive disorder, recurrent, moderate: Secondary | ICD-10-CM

## 2016-11-04 ENCOUNTER — Encounter: Payer: Self-pay | Admitting: Endocrinology

## 2016-11-04 ENCOUNTER — Ambulatory Visit (INDEPENDENT_AMBULATORY_CARE_PROVIDER_SITE_OTHER): Payer: Medicare Other | Admitting: Endocrinology

## 2016-11-04 DIAGNOSIS — R7303 Prediabetes: Secondary | ICD-10-CM

## 2016-11-04 LAB — POCT GLYCOSYLATED HEMOGLOBIN (HGB A1C): HEMOGLOBIN A1C: 5

## 2016-11-04 MED ORDER — PIOGLITAZONE HCL 15 MG PO TABS: 15.0000 mg | ORAL_TABLET | Freq: Every day | ORAL | 11 refills | Status: DC

## 2016-11-04 NOTE — Patient Instructions (Addendum)
I have sent a prescription to your pharmacy, for a pill to help the blood sugar and symptoms.   Please come back for a follow-up appointment in 1 month.       Hypoglycemia Hypoglycemia occurs when the level of sugar (glucose) in the blood is too low. Glucose is a type of sugar that provides the body's main source of energy. Certain hormones (insulin and glucagon) control the level of glucose in the blood. Insulin lowers blood glucose, and glucagon increases blood glucose. Hypoglycemia can result from having too much insulin in the bloodstream, or from not eating enough food that contains glucose. Hypoglycemia can happen in people who do or do not have diabetes. It can develop quickly, and it can be a medical emergency. What are the causes? Hypoglycemia occurs most often in people who have diabetes. If you have diabetes, hypoglycemia may be caused by:  Diabetes medicine.  Not eating enough, or not eating often enough.  Increased physical activity.  Drinking alcohol, especially when you have not eaten recently.  If you do not have diabetes, hypoglycemia may be caused by:  A tumor in the pancreas. The pancreas is the organ that makes insulin.  Not eating enough, or not eating for long periods at a time (fasting).  Severe infection or illness that affects the liver, heart, or kidneys.  Certain medicines.  You may also have reactive hypoglycemia. This condition causes hypoglycemia within 4 hours of eating a meal. This may occur after having stomach surgery. Sometimes, the cause of reactive hypoglycemia is not known. What increases the risk? Hypoglycemia is more likely to develop in:  People who have diabetes and take medicines to lower blood glucose.  People who abuse alcohol.  People who have a severe illness.  What are the signs or symptoms? Hypoglycemia may not cause any symptoms. If you have symptoms, they may include:  Hunger.  Anxiety.  Sweating and feeling  clammy.  Confusion.  Dizziness or feeling light-headed.  Sleepiness.  Nausea.  Increased heart rate.  Headache.  Blurry vision.  Seizure.  Nightmares.  Tingling or numbness around the mouth, lips, or tongue.  A change in speech.  Decreased ability to concentrate.  A change in coordination.  Restless sleep.  Tremors or shakes.  Fainting.  Irritability.  How is this diagnosed? Hypoglycemia is diagnosed with a blood test to measure your blood glucose level. This blood test is done while you are having symptoms. Your health care provider may also do a physical exam and review your medical history. If you do not have diabetes, other tests may be done to find the cause of your hypoglycemia. How is this treated? This condition can often be treated by immediately eating or drinking something that contains glucose, such as:  3-4 sugar tablets (glucose pills).  Glucose gel, 15-gram tube.  Fruit juice, 4 oz (120 mL).  Regular soda (not diet soda), 4 oz (120 mL).  Low-fat milk, 4 oz (120 mL).  Several pieces of hard candy.  Sugar or honey, 1 Tbsp.  Treating Hypoglycemia If You Have Diabetes  If you are alert and able to swallow safely, follow the 15:15 rule:  Take 15 grams of a rapid-acting carbohydrate. Rapid-acting options include: ? 1 tube of glucose gel. ? 3 glucose pills. ? 6-8 pieces of hard candy. ? 4 oz (120 mL) of fruit juice. ? 4 oz (120 ml) of regular (not diet) soda.  Check your blood glucose 15 minutes after you take the carbohydrate.  If the repeat blood glucose level is still at or below 70 mg/dL (3.9 mmol/L), take 15 grams of a carbohydrate again.  If your blood glucose level does not increase above 70 mg/dL (3.9 mmol/L) after 3 tries, seek emergency medical care.  After your blood glucose level returns to normal, eat a meal or a snack within 1 hour.  Treating Severe Hypoglycemia Severe hypoglycemia is when your blood glucose level is at  or below 54 mg/dL (3 mmol/L). Severe hypoglycemia is an emergency. Do not wait to see if the symptoms will go away. Get medical help right away. Call your local emergency services (911 in the [place] Do not drive yourself to the hospital. If you have severe hypoglycemia and you cannot eat or drink, you may need an injection of glucagon. A family member or close friend should learn how to check your blood glucose and how to give you a glucagon injection. Ask your health care provider if you need to have an emergency glucagon injection kit available. Severe hypoglycemia may need to be treated in a hospital. The treatment may include getting glucose through an IV tube. You may also need treatment for the cause of your hypoglycemia. Follow these instructions at home: General instructions  Avoid any diets that cause you to not eat enough food. Talk with your health care provider before you start any new diet.  Take over-the-counter and prescription medicines only as told by your health care provider.  Limit alcohol intake to no more than 1 drink per day for nonpregnant women and 2 drinks per day for men. One drink equals 12 oz of beer, 5 oz of wine, or 1 oz of hard liquor.  Keep all follow-up visits as told by your health care provider. This is important. If You Have Diabetes:   Make sure you know the symptoms of hypoglycemia.  Always have a rapid-acting carbohydrate snack with you to treat low blood sugar.  Follow your diabetes management plan, as told by your health care provider. Make sure you: ? Take your medicines as directed. ? Follow your exercise plan. ? Follow your meal plan. Eat on time, and do not skip meals. ? Check your blood glucose as often as directed. Make sure to check your blood glucose before and after exercise. If you exercise longer or in a different way than usual, check your blood glucose more often. ? Follow your sick day plan whenever you cannot eat or drink normally.  Make this plan in advance with your health care provider.  Share your diabetes management plan with people in your workplace, school, and household.  Check your urine for ketones when you are ill and as told by your health care provider.  Carry a medical alert card or wear medical alert jewelry. If You Have Reactive Hypoglycemia or Low Blood Sugar From Other Causes:  Monitor your blood glucose as told by your health care provider.  Follow instructions from your health care provider about eating or drinking restrictions. Contact a health care provider if:  You have problems keeping your blood glucose in your target range.  You have frequent episodes of hypoglycemia. Get help right away if:  You continue to have hypoglycemia symptoms after eating or drinking something containing glucose.  Your blood glucose is at or below 54 mg/dL (3 mmol/L).  You have a seizure.  You faint. These symptoms may represent a serious problem that is an emergency. Do not wait to see if the symptoms will go away.  Get medical help right away. Call your local emergency services (911 in the [place] Do not drive yourself to the hospital. This information is not intended to replace advice given to you by your health care provider. Make sure you discuss any questions you have with your health care provider. Document Released: 12/31/2004 Document Revised: 06/14/2015 Document Reviewed: 02/03/2015 Elsevier Interactive Patient Education  [employer].

## 2016-11-04 NOTE — Progress Notes (Signed)
Subjective:    Patient ID: Kayla [name], female    DOB: [birth year], 71 y.o.   MRN: [id number]  HPI Pt is referred by Dr Brigitte Pulse, for hypoglycemia.  Pt had gastric bypass surgery in 1999.  She reports intermittent hypoglycemia since 1979.  She says cbg's are as high as 400 just after eating.  It then goes as low as 40, as soon as 40 minutes after eating. Symptoms are relieved by eating or drinking sugar.  She takes no diabetes medication.  She has never had alcohol intake, adrenal problems, pituitary problems, liver problems, or kidney problems. When cbg is low, she has moderate palpitations in the chest, and assoc difficulty with concentration. Past Medical History:  Diagnosis Date  . Allergy   . Anemia    anemia in the past  . Angina    takes Propanolol prn and it stops her chest  . Anxiety   . Barrett esophagus    not consistently present  . Cataract   . Chronic kidney disease    kidney stones and UTI  . Complication of anesthesia    either  BP or pulse dropped with last surgery  . DDD (degenerative disc disease)    cervical and lumbar  . Dementia   . Depression    post traumatic stress  disorder  . Diabetes mellitus    sugar goes up and down diet controlled  . Dysrhythmia    brought on by stress  . External hemorrhoids   . Fatigue   . Fibromyalgia    neuropathy knees ankles and toes, bladder  . GERD (gastroesophageal reflux disease)   . H/O hyperparathyroidism   . Headache(784.0)    migraines  . Hiatal hernia   . History of kidney stones   . Hypertension    3 small brain aneurysms  . IBS (irritable bowel syndrome)   . Internal hemorrhoids   . Macular degeneration   . Migraines   . Osteoarthritis    all over  . Osteopetrosis   . Peripheral vascular disease (HCC)    superficial phlebitis in left calf  . Personal history of colonic polyps 07/22/2013   07/2013 - 3 small adenomas - repeat colonoscopy 2018    Past Surgical History:  Procedure Laterality Date  .  ABDOMINAL HYSTERECTOMY    . APPENDECTOMY    . CARDIAC CATHETERIZATION  03/22/1991   EF 61%  . CARDIOVASCULAR STRESS TEST  10/03/2006  . CHOLECYSTECTOMY    . COLONOSCOPY  06/23/2008   normal  . DILATION AND CURETTAGE OF UTERUS     x2  . ESOPHAGUS SURGERY    . EXPLORATORY LAPAROTOMY  1993  . GASTRIC BYPASS  1999  . GASTRIC RESTRICTION SURGERY  1991  . Right Arm Surgery    . Right Knee Arthroscopy    . Rt wrist fx  2009  . stomach stappeling  1991  . STONE EXTRACTION WITH BASKET  2012  . TONSILLECTOMY    . TUBAL LIGATION    . UPPER GASTROINTESTINAL ENDOSCOPY  06/23/2008   w/Dil, Barrett's esophagus  . US ECHOCARDIOGRAPHY  01/21/2007   EF 55-60%  . US ECHOCARDIOGRAPHY  08/30/2003   EF 55-60%    Social History   Social History  . Marital status: Married    Spouse name: Rushie Chestnut.  . Number of children: 2  . Years of education: N/A   Occupational History  . Disability Retired   Social History Main Topics  . Smoking status: Never Smoker  .  Smokeless tobacco: Never Used  . Alcohol use No  . Drug use: No  . Sexual activity: Not Currently   Other Topics Concern  . Not on file   Social History Narrative   She retired on disability   Married   2 Children    Current Outpatient Prescriptions on File Prior to Visit  Medication Sig Dispense Refill  . Ascorbic Acid (VITAMIN C) 1000 MG tablet Take 1,000 mg by mouth daily.    Marland Kitchen aspirin-acetaminophen-caffeine (EXCEDRIN MIGRAINE) 250-250-65 MG tablet Take 2 tablets by mouth 2 (two) times daily.    . beta carotene w/minerals (OCUVITE) tablet Take 1 tablet by mouth 2 (two) times daily.    Marland Kitchen CALCIUM-MAGNESIUM-ZINC PO Take by mouth daily.    . Cholecalciferol (VITAMIN D3) 2000 UNITS TABS Take 2,000 Units by mouth daily.    . clonazePAM (KLONOPIN) 1 MG tablet Take 0.5-1 tablets (0.5-1 mg total) by mouth 2 (two) times daily as needed. anxiety/panic attacks. May fill on or after March 2018. This is a 3 month supply 180 tablet 1  .  Cyanocobalamin (VITAMIN B-12) 1000 MCG SUBL Place 5,000 mcg under the tongue daily.     . cyclobenzaprine (FLEXERIL) 10 MG tablet Take 1 tablet (10 mg total) by mouth 3 (three) times daily as needed for muscle spasms. 270 tablet 1  . cycloSPORINE (RESTASIS) 0.05 % ophthalmic emulsion 2 (two) times daily.    . diclofenac sodium (VOLTAREN) 1 % GEL Apply 2 g topically 4 (four) times daily. (Patient taking differently: Apply 2 g topically as needed (pain). ) 700 g 1  . dicyclomine (BENTYL) 10 MG capsule Take 1-2 tab 3 times daily AC as needed for spasms and cramping. 270 capsule 1  . diltiazem 2 % GEL Apply 1 application topically 2 (two) times daily as needed (pain from fissure). 30 g 2  . docusate sodium (COLACE) 250 MG capsule Take 250 mg by mouth daily.    Marland Kitchen esomeprazole (NEXIUM) 40 MG capsule Take 1 capsule (40 mg total) by mouth daily before breakfast. 90 capsule 1  . fentaNYL (DURAGESIC - DOSED MCG/HR) 50 MCG/HR Place 50 mcg onto the skin every 3 (three) days.    . hypromellose (SYSTANE OVERNIGHT THERAPY) 0.3 % GEL ophthalmic ointment Place 1 drop into both eyes at bedtime. Reported on 02/11/2015    . IRON PO Take by mouth.    . lidocaine (LIDODERM) 5 % Place 1 patch onto the skin as needed. Remove & Discard patch within 12 hours. May dispense as 3 month supply 90 patch 3  . Lifitegrast (XIIDRA) 5 % SOLN Place 1 drop into both eyes daily.     . Loteprednol Etabonate (LOTEMAX) 0.5 % GEL Place 1 drop into both eyes 2 (two) times daily. Reported on 02/11/2015    . mirabegron ER (MYRBETRIQ) 25 MG TB24 tablet Take 1 tablet (25 mg total) by mouth daily. 90 tablet 1  . mupirocin ointment (BACTROBAN) 2 % Apply 1 application topically 3 (three) times daily. 30 g 1  . Naloxone HCl 0.4 MG/0.4ML SOAJ Administer 0.94m [place] at first sign of opioid overdose and repeat every 2 minutes as needed for resuscitation. Call 911 immediately 5 Package 0  . Omega-3 Fatty Acids (SEA-OMEGA PO) Take 1,000 mg by mouth daily.      . ondansetron (ZOFRAN) 8 MG tablet Take 1 tablet (8 mg total) by mouth every 8 (eight) hours as needed for nausea or vomiting. 20 tablet 0  . pregabalin (LYRICA) 50 MG  capsule Take 1 capsule (50 mg total) by mouth 3 (three) times daily. 90 capsule 2  . pregabalin (LYRICA) 50 MG capsule Take 1 capsule (50 mg total) by mouth 3 (three) times daily. 270 capsule 0  . propranolol (INDERAL) 10 MG tablet Take 0.5 tablets (5 mg total) by mouth daily.    Marland Kitchen pyridoxine (B-6) 200 MG tablet Take 200 mg by mouth daily.    . SUMAtriptan (IMITREX) 100 MG tablet Take 1 tablet at earliest onset of headache, may repeat in 2 hours if headache persists or reoccurs. (Patient taking differently: Take 100 mg by mouth every 3 (three) days. ) 30 tablet 1  . traZODone (DESYREL) 100 MG tablet Take 0.5-1 tablets (50-100 mg total) by mouth at bedtime as needed for sleep. And 1/2 -1 if wakes at 4am. (Patient taking differently: Take 25 mg by mouth at bedtime. ) 90 tablet 1  . Vitamin D, Ergocalciferol, (DRISDOL) 50000 units CAPS capsule Take 1 capsule (50,000 Units total) by mouth every 7 (seven) days. 12 capsule 0  . [DISCONTINUED] buPROPion (WELLBUTRIN) 75 MG tablet Take 75 mg by mouth daily after breakfast.      No current facility-administered medications on file prior to visit.     Allergies  Allergen Reactions  . Ambien [Zolpidem Tartrate]     Hallucinations  . Codeine Anaphylaxis  . Oxycodone-Acetaminophen Shortness Of Breath  . Tylox [Oxycodone-Acetaminophen] Anaphylaxis    Chest pain  . Darvocet [Propoxyphene N-Acetaminophen]     Chest pain  . Darvon [Propoxyphene] Itching  . Food Swelling    Peanuts-wheat-  . Lorcet [Hydrocodone-Acetaminophen]     Chest pain  . Opium     hallucinations  . Vicodin [Hydrocodone-Acetaminophen] Other (See Comments)    Chest Pain   . Wheat Bran   . Amoxicillin Nausea And Vomiting    Reaction:  Migraine headache  . Penicillins Nausea And Vomiting    Migraine Headaches     Family History  Problem Relation Age of Onset  . Stroke Mother   . Lung cancer Mother   . Heart disease Mother   . Diabetes Mother   . Hypertension Father   . Stroke Father   . Lung cancer Father   . Heart disease Father   . Kidney disease Father   . Seizures Father        epilepsy, and sisters x 2  . Pancreatic cancer Sister   . Lung cancer Sister   . Colon cancer Maternal Aunt        12 relatives  . Uterine cancer Unknown        aunt  . Heart disease Unknown        grandmother  . Clotting disorder Sister   . Heart disease Sister        x 2  . Kidney disease Sister        x 2  . Breast cancer Sister   . Colon cancer Paternal Aunt   . Colon cancer Paternal Aunt     BP 100/68 (BP Location: Left Arm, Patient Position: Sitting, Cuff Size: Normal)   Pulse 67   Temp 98.7 F (37.1 C) (Oral)   Ht '5\' 6"'  (1.676 m)   Wt 112 lb 8 oz (51 kg)   SpO2 97%   BMI 18.16 kg/m    Review of Systems denies recent weight change, fever, diarrhea, rash, visual loss, abdominal pain, sob, n/v, seizure, or LOC.  With hypoglycemia, she also has tremor and severe fatigue.  She has chronic headache, leg edema, foot numbness, urinary frequency, menopausal sxs, leg cramps, excessive diaphoresis, myalgias, muscle weakness, cold intolerance, easy bruising, rhinorrhea, and anxiety.       Objective:   Physical Exam VS: see vs page GEN: no distress HEAD: head: no deformity eyes: no periorbital swelling, no proptosis external nose and ears are normal mouth: no lesion seen NECK: supple, thyroid is not enlarged CHEST WALL: no deformity LUNGS: clear to auscultation CV: reg rate and rhythm, no murmur ABD: abdomen is soft, nontender.  no hepatosplenomegaly.  not distended.  no hernia.  Old healed surgical scars.  MUSCULOSKELETAL: muscle bulk and strength are grossly normal.  no obvious joint swelling.  gait is normal and steady EXTEMITIES: no deformity.  no ulcer on the feet.  feet are of  normal color and temp.  no edema PULSES: dorsalis pedis intact bilat.  no carotid bruit NEURO:  cn 2-12 grossly intact.   readily moves all 4's.  sensation is intact to touch on the feet, but decreased from normal.  No tremor. SKIN:  Normal texture and temperature.  No rash or suspicious lesion is visible.  Not diaphoretic. NODES:  None palpable at the neck PSYCH: alert, well-oriented.  Does not appear anxious nor depressed.     Lab Results  Component Value Date   HGBA1C 5.3 01/22/2016   Lab Results  Component Value Date   CREATININE 0.70 09/12/2016   BUN 26 (H) 09/12/2016   NA 139 09/12/2016   K 4.8 09/12/2016   CL 97 (L) 09/12/2016   CO2 31 09/12/2016   Lab Results  Component Value Date   ALT 27 08/19/2016   AST 30 08/19/2016   GGT 18 07/01/2016   ALKPHOS 114 08/19/2016   BILITOT 0.3 08/19/2016   I have reviewed outside records, and summarized: Pt was noted to have hypoglycemia, and referred here.  She was also noted to have hypotension, neuropathic pain, and several other probs.       Assessment & Plan:  Reactive hypoglycemia, new to me, apparently exac by gastric bypass surgery. Type 2 DM: this is dx'ed by glucose over 400.  Due to her lean body habitus, she is at risk for evolving type 1 DM.     Patient Instructions  I have sent a prescription to your pharmacy, for a pill to help the blood sugar and symptoms.   Please come back for a follow-up appointment in 1 month.       Hypoglycemia Hypoglycemia occurs when the level of sugar (glucose) in the blood is too low. Glucose is a type of sugar that provides the body's main source of energy. Certain hormones (insulin and glucagon) control the level of glucose in the blood. Insulin lowers blood glucose, and glucagon increases blood glucose. Hypoglycemia can result from having too much insulin in the bloodstream, or from not eating enough food that contains glucose. Hypoglycemia can happen in people who do or do not have  diabetes. It can develop quickly, and it can be a medical emergency. What are the causes? Hypoglycemia occurs most often in people who have diabetes. If you have diabetes, hypoglycemia may be caused by:  Diabetes medicine.  Not eating enough, or not eating often enough.  Increased physical activity.  Drinking alcohol, especially when you have not eaten recently.  If you do not have diabetes, hypoglycemia may be caused by:  A tumor in the pancreas. The pancreas is the organ that makes insulin.  Not eating enough, or  not eating for long periods at a time (fasting).  Severe infection or illness that affects the liver, heart, or kidneys.  Certain medicines.  You may also have reactive hypoglycemia. This condition causes hypoglycemia within 4 hours of eating a meal. This may occur after having stomach surgery. Sometimes, the cause of reactive hypoglycemia is not known. What increases the risk? Hypoglycemia is more likely to develop in:  People who have diabetes and take medicines to lower blood glucose.  People who abuse alcohol.  People who have a severe illness.  What are the signs or symptoms? Hypoglycemia may not cause any symptoms. If you have symptoms, they may include:  Hunger.  Anxiety.  Sweating and feeling clammy.  Confusion.  Dizziness or feeling light-headed.  Sleepiness.  Nausea.  Increased heart rate.  Headache.  Blurry vision.  Seizure.  Nightmares.  Tingling or numbness around the mouth, lips, or tongue.  A change in speech.  Decreased ability to concentrate.  A change in coordination.  Restless sleep.  Tremors or shakes.  Fainting.  Irritability.  How is this diagnosed? Hypoglycemia is diagnosed with a blood test to measure your blood glucose level. This blood test is done while you are having symptoms. Your health care provider may also do a physical exam and review your medical history. If you do not have diabetes, other tests  may be done to find the cause of your hypoglycemia. How is this treated? This condition can often be treated by immediately eating or drinking something that contains glucose, such as:  3-4 sugar tablets (glucose pills).  Glucose gel, 15-gram tube.  Fruit juice, 4 oz (120 mL).  Regular soda (not diet soda), 4 oz (120 mL).  Low-fat milk, 4 oz (120 mL).  Several pieces of hard candy.  Sugar or honey, 1 Tbsp.  Treating Hypoglycemia If You Have Diabetes  If you are alert and able to swallow safely, follow the 15:15 rule:  Take 15 grams of a rapid-acting carbohydrate. Rapid-acting options include: ? 1 tube of glucose gel. ? 3 glucose pills. ? 6-8 pieces of hard candy. ? 4 oz (120 mL) of fruit juice. ? 4 oz (120 ml) of regular (not diet) soda.  Check your blood glucose 15 minutes after you take the carbohydrate.  If the repeat blood glucose level is still at or below 70 mg/dL (3.9 mmol/L), take 15 grams of a carbohydrate again.  If your blood glucose level does not increase above 70 mg/dL (3.9 mmol/L) after 3 tries, seek emergency medical care.  After your blood glucose level returns to normal, eat a meal or a snack within 1 hour.  Treating Severe Hypoglycemia Severe hypoglycemia is when your blood glucose level is at or below 54 mg/dL (3 mmol/L). Severe hypoglycemia is an emergency. Do not wait to see if the symptoms will go away. Get medical help right away. Call your local emergency services (911 in the [place] Do not drive yourself to the hospital. If you have severe hypoglycemia and you cannot eat or drink, you may need an injection of glucagon. A family member or close friend should learn how to check your blood glucose and how to give you a glucagon injection. Ask your health care provider if you need to have an emergency glucagon injection kit available. Severe hypoglycemia may need to be treated in a hospital. The treatment may include getting glucose through an IV tube. You  may also need treatment for the cause of your hypoglycemia. Follow these instructions  at home: General instructions  Avoid any diets that cause you to not eat enough food. Talk with your health care provider before you start any new diet.  Take over-the-counter and prescription medicines only as told by your health care provider.  Limit alcohol intake to no more than 1 drink per day for nonpregnant women and 2 drinks per day for men. One drink equals 12 oz of beer, 5 oz of wine, or 1 oz of hard liquor.  Keep all follow-up visits as told by your health care provider. This is important. If You Have Diabetes:   Make sure you know the symptoms of hypoglycemia.  Always have a rapid-acting carbohydrate snack with you to treat low blood sugar.  Follow your diabetes management plan, as told by your health care provider. Make sure you: ? Take your medicines as directed. ? Follow your exercise plan. ? Follow your meal plan. Eat on time, and do not skip meals. ? Check your blood glucose as often as directed. Make sure to check your blood glucose before and after exercise. If you exercise longer or in a different way than usual, check your blood glucose more often. ? Follow your sick day plan whenever you cannot eat or drink normally. Make this plan in advance with your health care provider.  Share your diabetes management plan with people in your workplace, school, and household.  Check your urine for ketones when you are ill and as told by your health care provider.  Carry a medical alert card or wear medical alert jewelry. If You Have Reactive Hypoglycemia or Low Blood Sugar From Other Causes:  Monitor your blood glucose as told by your health care provider.  Follow instructions from your health care provider about eating or drinking restrictions. Contact a health care provider if:  You have problems keeping your blood glucose in your target range.  You have frequent episodes of  hypoglycemia. Get help right away if:  You continue to have hypoglycemia symptoms after eating or drinking something containing glucose.  Your blood glucose is at or below 54 mg/dL (3 mmol/L).  You have a seizure.  You faint. These symptoms may represent a serious problem that is an emergency. Do not wait to see if the symptoms will go away. Get medical help right away. Call your local emergency services (911 in the [place] Do not drive yourself to the hospital. This information is not intended to replace advice given to you by your health care provider. Make sure you discuss any questions you have with your health care provider. Document Released: 12/31/2004 Document Revised: 06/14/2015 Document Reviewed: 02/03/2015 Elsevier Interactive Patient Education  [employer].

## 2016-11-06 ENCOUNTER — Telehealth: Payer: Self-pay | Admitting: Endocrinology

## 2016-11-06 ENCOUNTER — Telehealth: Payer: Self-pay | Admitting: Family Medicine

## 2016-11-06 ENCOUNTER — Encounter (HOSPITAL_BASED_OUTPATIENT_CLINIC_OR_DEPARTMENT_OTHER): Payer: Self-pay

## 2016-11-06 DIAGNOSIS — E162 Hypoglycemia, unspecified: Secondary | ICD-10-CM

## 2016-11-06 DIAGNOSIS — K2100 Gastro-esophageal reflux disease with esophagitis, without bleeding: Secondary | ICD-10-CM

## 2016-11-06 DIAGNOSIS — M5137 Other intervertebral disc degeneration, lumbosacral region: Secondary | ICD-10-CM

## 2016-11-06 DIAGNOSIS — K3189 Other diseases of stomach and duodenum: Secondary | ICD-10-CM

## 2016-11-06 DIAGNOSIS — F411 Generalized anxiety disorder: Secondary | ICD-10-CM

## 2016-11-06 DIAGNOSIS — R7303 Prediabetes: Secondary | ICD-10-CM

## 2016-11-06 DIAGNOSIS — K449 Diaphragmatic hernia without obstruction or gangrene: Secondary | ICD-10-CM

## 2016-11-06 NOTE — Telephone Encounter (Signed)
Pt is wanting to let Dr. Brigitte Pulse know that she has seen the endocrinologist and was given actose to start and that she was supposed to have all her meds from Dr. Brigitte Pulse Refilled and sent to [facility] but the do not have anything and it takes 21 days to get the meds and she states she needs refills on all   Best number [phone]

## 2016-11-06 NOTE — Telephone Encounter (Signed)
Glucose levels have been 80-99 and with the new med it is dropping her bs too low, she is feeling like she needs to speak with a dietician

## 2016-11-07 ENCOUNTER — Telehealth: Payer: Self-pay | Admitting: Endocrinology

## 2016-11-07 MED ORDER — ACARBOSE 25 MG PO TABS: 25.0000 mg | ORAL_TABLET | Freq: Three times a day (TID) | ORAL | 5 refills | Status: DC

## 2016-11-07 NOTE — Telephone Encounter (Signed)
I called patient & she stated that because her blood sugars will constantly drop into the 50-60's that she stoppped taking mediation. Last night after she ate meatloaf & potatoes blood sugar reached 259 then dropped back into the 60's. She really wants to be referred to a dietician & I recommend making at appt. With Mickel Baas but her concern is the cost. She would like to be referred so she would know what to eat. Also patient said that Lyrica doesn't fully help her neuropathy & she can't imagine living this way forever. She was wondering other options? I will call patient tomorrow & she inform her that she can either stay on same medication & give it a chance to take effect, or try other medication you guys discussed.

## 2016-11-07 NOTE — Telephone Encounter (Signed)
please call patient: Madaline Brilliant, please change to the alternative medication.  I have sent a prescription to your pharmacy.  The lyrica is outside the scope of my practice, but I still hope you feel better soon.

## 2016-11-07 NOTE — Telephone Encounter (Signed)
Patient requests for you to call her. She has questions. If she doesn't answer the phone number on chart please call her at [phone]

## 2016-11-07 NOTE — Telephone Encounter (Signed)
This is an Hospital doctor patient.

## 2016-11-07 NOTE — Telephone Encounter (Signed)
Please reduce the pioglitizone to 1/2 pill per day

## 2016-11-08 ENCOUNTER — Encounter: Payer: Self-pay | Admitting: Family Medicine

## 2016-11-08 DIAGNOSIS — E162 Hypoglycemia, unspecified: Secondary | ICD-10-CM

## 2016-11-08 HISTORY — DX: Hypoglycemia, unspecified: E16.2

## 2016-11-08 MED ORDER — PROPRANOLOL HCL 10 MG PO TABS: ORAL_TABLET | ORAL | 1 refills | Status: DC

## 2016-11-08 MED ORDER — GLUCOSE BLOOD VI STRP: ORAL_STRIP | 5 refills | Status: DC

## 2016-11-08 MED ORDER — CYCLOBENZAPRINE HCL 10 MG PO TABS: 10.0000 mg | ORAL_TABLET | Freq: Three times a day (TID) | ORAL | 1 refills | Status: DC | PRN

## 2016-11-08 MED ORDER — DICLOFENAC SODIUM 1 % TD GEL: 2.0000 g | Freq: Four times a day (QID) | TRANSDERMAL | 3 refills | Status: DC

## 2016-11-08 MED ORDER — ONETOUCH LANCETS MISC: 5 refills | Status: DC

## 2016-11-08 MED ORDER — DOCUSATE SODIUM 250 MG PO CAPS: 250.0000 mg | ORAL_CAPSULE | Freq: Every day | ORAL | 3 refills | Status: DC

## 2016-11-08 MED ORDER — ONDANSETRON HCL 8 MG PO TABS: 8.0000 mg | ORAL_TABLET | Freq: Three times a day (TID) | ORAL | 3 refills | Status: DC | PRN

## 2016-11-08 MED ORDER — CYCLOSPORINE 0.05 % OP EMUL: 1.0000 [drp] | Freq: Two times a day (BID) | OPHTHALMIC | 5 refills | Status: AC

## 2016-11-08 MED ORDER — MIRABEGRON ER 25 MG PO TB24: 25.0000 mg | ORAL_TABLET | Freq: Every day | ORAL | 3 refills | Status: DC

## 2016-11-08 MED ORDER — LIDOCAINE 5 % EX PTCH: 1.0000 | MEDICATED_PATCH | CUTANEOUS | 3 refills | Status: DC | PRN

## 2016-11-08 MED ORDER — DICYCLOMINE HCL 10 MG PO CAPS: ORAL_CAPSULE | ORAL | 1 refills | Status: DC

## 2016-11-08 MED ORDER — CLONAZEPAM 1 MG PO TABS: 0.5000 mg | ORAL_TABLET | Freq: Two times a day (BID) | ORAL | 1 refills | Status: DC | PRN

## 2016-11-08 MED ORDER — ESOMEPRAZOLE MAGNESIUM 40 MG PO CPDR: 40.0000 mg | DELAYED_RELEASE_CAPSULE | Freq: Every day | ORAL | 3 refills | Status: DC

## 2016-11-08 MED ORDER — TRAZODONE HCL 100 MG PO TABS: 50.0000 mg | ORAL_TABLET | Freq: Every evening | ORAL | 1 refills | Status: DC | PRN

## 2016-11-08 NOTE — Telephone Encounter (Signed)
Pt advised.

## 2016-11-08 NOTE — Telephone Encounter (Signed)
Sent in

## 2016-11-08 NOTE — Telephone Encounter (Signed)
Pt is requesting refills on the following medications:  Clonazepam Flexeril Restasis Voltaren Gel Bentyl Colace Esomeprazole Lidiocaine 1% Mirabegron Ondansetron Propanolol  Trazodone One touch lifestyle test strips and lancets  She would like them all sent to her [facility] mail order pharmacy.

## 2016-11-12 ENCOUNTER — Ambulatory Visit: Payer: Self-pay | Admitting: Psychiatry

## 2016-11-13 ENCOUNTER — Other Ambulatory Visit: Payer: Self-pay

## 2016-11-13 NOTE — Telephone Encounter (Signed)
I called patient & she had not yet picked up the new diabetic medication. She stated that she would really like a referral to see a dietician, maybe Mickel Baas?

## 2016-11-13 NOTE — Telephone Encounter (Signed)
done

## 2016-11-14 ENCOUNTER — Telehealth: Payer: Self-pay | Admitting: Neurology

## 2016-11-14 DIAGNOSIS — G894 Chronic pain syndrome: Secondary | ICD-10-CM | POA: Diagnosis not present

## 2016-11-14 DIAGNOSIS — G5602 Carpal tunnel syndrome, left upper limb: Secondary | ICD-10-CM | POA: Diagnosis not present

## 2016-11-14 DIAGNOSIS — M47812 Spondylosis without myelopathy or radiculopathy, cervical region: Secondary | ICD-10-CM | POA: Diagnosis not present

## 2016-11-14 DIAGNOSIS — M170 Bilateral primary osteoarthritis of knee: Secondary | ICD-10-CM | POA: Diagnosis not present

## 2016-11-14 DIAGNOSIS — M797 Fibromyalgia: Secondary | ICD-10-CM | POA: Diagnosis not present

## 2016-11-14 DIAGNOSIS — R51 Headache: Secondary | ICD-10-CM | POA: Diagnosis not present

## 2016-11-14 NOTE — Telephone Encounter (Signed)
Patient called needing to have her NCS results faxed over to her Pain Management  Doctor. She is at that office now. They never received them. The fax # is [phone]. Thanks

## 2016-11-14 NOTE — Telephone Encounter (Signed)
Faxed EMG/NCV results

## 2016-11-18 ENCOUNTER — Other Ambulatory Visit: Payer: Self-pay | Admitting: Family Medicine

## 2016-11-18 ENCOUNTER — Other Ambulatory Visit: Payer: Self-pay | Admitting: Endocrinology

## 2016-11-18 DIAGNOSIS — E119 Type 2 diabetes mellitus without complications: Secondary | ICD-10-CM

## 2016-11-18 DIAGNOSIS — G609 Hereditary and idiopathic neuropathy, unspecified: Secondary | ICD-10-CM

## 2016-11-18 DIAGNOSIS — E11649 Type 2 diabetes mellitus with hypoglycemia without coma: Secondary | ICD-10-CM

## 2016-11-18 HISTORY — DX: Type 2 diabetes mellitus without complications: E11.9

## 2016-11-18 NOTE — Telephone Encounter (Signed)
Patient states she needs to get the freestyle test strips and lancets because the ones that were order the company does not have anymore. But they do have these kinds.   She wants to know if Dr Tamala Julian would consider her being put on some kind of meter that she doesn't have stick herself so many times during the day.  Her call back number is [phone]

## 2016-11-20 ENCOUNTER — Ambulatory Visit (INDEPENDENT_AMBULATORY_CARE_PROVIDER_SITE_OTHER): Payer: Medicare Other | Admitting: Cardiovascular Disease

## 2016-11-20 ENCOUNTER — Encounter: Payer: Self-pay | Admitting: Cardiovascular Disease

## 2016-11-20 VITALS — BP 80/52 | HR 76 | Resp 16 | Ht 65.0 in | Wt 116.4 lb

## 2016-11-20 DIAGNOSIS — I951 Orthostatic hypotension: Secondary | ICD-10-CM | POA: Diagnosis not present

## 2016-11-20 DIAGNOSIS — I340 Nonrheumatic mitral (valve) insufficiency: Secondary | ICD-10-CM

## 2016-11-20 HISTORY — DX: Orthostatic hypotension: I95.1

## 2016-11-20 NOTE — Telephone Encounter (Signed)
Please see pt's request below regarding changing diabetic supplies.  Please advise.

## 2016-11-20 NOTE — Patient Instructions (Signed)

## 2016-11-20 NOTE — Progress Notes (Signed)
Kayla [name] Date of Birth  Nov 25, 1945       [facility] Office [address], [address], [place], [place]  [address]   [place], [place]  [address] [phone]     [phone]   Fax  [phone]    Fax [phone]  Problem List: 1. Mitral valve prolapse-mitral regurgitation 2. Hypertension 3. Barrett's esophagus 4. Fibromyalgia 5. History chest pain-she had a normal heart catheterization in 1993. Stress Myoview study in 2008 was normal. 6. Brain aneurism -  7. S/p Roux-en-Y surgery  for Barretts esophagus  History of Present Illness: Kayla [name] continues to have chest pain.  She takes PRN propranolol which seems to help.  It typically occurs in the afternoon.  Almost always with rest.  She does not get any regular exercise.  She has had lots of bladder infections due to kidney stones recently that have not permitted her to exercise.  She's had lots of problems with anxiety.  July 15, 2014:  [name] presents for evaluation Her BP has been very low - possibly due to the various medications .  She has had left sided chest pain for years.  Has had a normal cath in the remote past and had a normal myoview  Several years ago .   She was recently seen by Dr. Newman Pies for some palpitations The palpitations would come and go .  Off and on for hours.  Was started on atenolol which has helped the palpitations. She had taken atenolol for years ( many years ago) but was off atenolol for several years.  Her palpitations have resolved after starting the atenolol .    These episodes of tachycardia are at times associated with episodes of CP . Has lost a lot of weight over the past several years. She had a Roux -in - Y gastric bypass ( supposedly to prevent esophageal cancer from Barretts esophagutis. Is not able to eat much.  Can eat 1/4 cup of food at a time .    Sept. 19, 2016:  Continues to have palpitations  - improve with  propranolol Continues to have poor appetite.  Is generally very weak Continues to see her primary medical doctor and her pain management doctor .   Nov. 27,2017:  Seems to be doing well from a cardiac standpoint. Having urology issues.  May need a bladder tack. Still has   Nov. 7, 2018 Has been diagnosed with DM and has diabetic neuropathy.   Lots of pain in her feet.   Still has some CP Still has some palpittations  .   Has lost 40 lbs over the past 10 years.  Admitted that she is not eating well.  Current Outpatient Medications on File Prior to Visit  Medication Sig Dispense Refill  . acarbose (PRECOSE) 25 MG tablet Take 1 tablet (25 mg total) by mouth 3 (three) times daily with meals. 90 tablet 5  . Ascorbic Acid (VITAMIN C) 1000 MG tablet Take 1,000 mg by mouth daily.    Marland Kitchen aspirin-acetaminophen-caffeine (EXCEDRIN MIGRAINE) 250-250-65 MG tablet Take 2 tablets by mouth 2 (two) times daily.    . beta carotene w/minerals (OCUVITE) tablet Take 1 tablet by mouth 2 (two) times daily.    Marland Kitchen CALCIUM-MAGNESIUM-ZINC PO Take by mouth daily.    . Cholecalciferol (VITAMIN D3) 2000 UNITS TABS Take 2,000 Units by mouth daily.    . clonazePAM (KLONOPIN) 1 MG tablet Take 0.5-1 tablets (0.5-1  mg total) by mouth 2 (two) times daily as needed for anxiety. 180 tablet 1  . Cyanocobalamin (VITAMIN B-12) 1000 MCG SUBL Place 5,000 mcg under the tongue daily.     . cyclobenzaprine (FLEXERIL) 10 MG tablet Take 1 tablet (10 mg total) by mouth 3 (three) times daily as needed for muscle spasms. 270 tablet 1  . cycloSPORINE (RESTASIS) 0.05 % ophthalmic emulsion Place 1 drop into both eyes 2 (two) times daily. 5.5 mL 5  . diclofenac sodium (VOLTAREN) 1 % GEL Apply 2 g topically 4 (four) times daily. 700 g 3  . dicyclomine (BENTYL) 10 MG capsule Take 1-2 tab 3 times daily AC as needed for spasms and cramping. 270 capsule 1  . diltiazem 2 % GEL Apply 1 application topically 2 (two) times daily as needed (pain from  fissure). 30 g 2  . docusate sodium (COLACE) 250 MG capsule Take 1 capsule (250 mg total) by mouth daily. 90 capsule 3  . esomeprazole (NEXIUM) 40 MG capsule Take 1 capsule (40 mg total) by mouth daily before breakfast. 90 capsule 3  . fentaNYL (DURAGESIC - DOSED MCG/HR) 50 MCG/HR Place 50 mcg onto the skin every 3 (three) days.    Marland Kitchen glucose blood test strip Use to check cbgs 6 times daily as directed by physician. Testing freq: hypoglycemia, hyperglycemia. ICD10: R73.03, E 16.2 600 each 5  . HYDROcodone-acetaminophen (NORCO) 7.5-325 MG tablet Take 1 tablet every 6 (six) hours as needed by mouth for moderate pain.    . hypromellose (SYSTANE OVERNIGHT THERAPY) 0.3 % GEL ophthalmic ointment Place 1 drop into both eyes at bedtime. Reported on 02/11/2015    . IRON PO Take by mouth.    Marland Kitchen L-Methylfolate-Algae-B12-B6 (METANX) 3-90.314-2-35 MG CAPS Take 1 capsule 2 (two) times daily by mouth.    . lidocaine (LIDODERM) 5 % Place 1 patch onto the skin as needed. Remove & Discard patch within 12 hours. May dispense as 3 month supply 90 patch 3  . Lifitegrast (XIIDRA) 5 % SOLN Place 1 drop into both eyes daily.     . mirabegron ER (MYRBETRIQ) 25 MG TB24 tablet Take 1 tablet (25 mg total) by mouth daily. 90 tablet 3  . mupirocin ointment (BACTROBAN) 2 % Apply 1 application topically 3 (three) times daily. 30 g 1  . Omega-3 Fatty Acids (SEA-OMEGA PO) Take 1,000 mg by mouth daily.    . ondansetron (ZOFRAN) 8 MG tablet Take 1 tablet (8 mg total) by mouth every 8 (eight) hours as needed for nausea or vomiting. 20 tablet 3  . ONE TOUCH LANCETS MISC Use to check cbgs 6 times daily as directed by physician. Testing freq: hypoglycemia, hyperglycemia. ICD10: R73.03, E 16.2 600 each 5  . pregabalin (LYRICA) 50 MG capsule Take 1 capsule (50 mg total) by mouth 3 (three) times daily. 90 capsule 2  . pregabalin (LYRICA) 50 MG capsule Take 1 capsule (50 mg total) by mouth 3 (three) times daily. 270 capsule 0  . propranolol  (INDERAL) 10 MG tablet Take 2.5 mg (1/4 tab) daily for palpitations and anxiety. 23 tablet 1  . pyridoxine (B-6) 200 MG tablet Take 200 mg by mouth daily.    . SUMAtriptan (IMITREX) 100 MG tablet Take 1 tablet at earliest onset of headache, may repeat in 2 hours if headache persists or reoccurs. (Patient taking differently: Take 100 mg by mouth every 3 (three) days. ) 30 tablet 1  . traZODone (DESYREL) 100 MG tablet Take 0.5-1 tablets (50-100 mg  total) by mouth at bedtime as needed for sleep. And 1/2 -1 if wakes at 4am. 90 tablet 1  . Vitamin D, Ergocalciferol, (DRISDOL) 50000 units CAPS capsule Take 1 capsule (50,000 Units total) by mouth every 7 (seven) days. 12 capsule 0  . Naloxone HCl 0.4 MG/0.4ML SOAJ Administer 0.4mg  [place] at first sign of opioid overdose and repeat every 2 minutes as needed for resuscitation. Call 911 immediately (Patient not taking: Reported on 11/20/2016) 5 Package 0  . [DISCONTINUED] buPROPion (WELLBUTRIN) 75 MG tablet Take 75 mg by mouth daily after breakfast.      No current facility-administered medications on file prior to visit.     Allergies  Allergen Reactions  . Ambien [Zolpidem Tartrate]     Hallucinations  . Codeine Anaphylaxis  . Oxycodone-Acetaminophen Shortness Of Breath  . Tylox [Oxycodone-Acetaminophen] Anaphylaxis    Chest pain  . Darvocet [Propoxyphene N-Acetaminophen]     Chest pain  . Darvon [Propoxyphene] Itching  . Food Swelling    Peanuts-wheat-  . Lorcet [Hydrocodone-Acetaminophen]     Chest pain  . Opium     hallucinations  . Vicodin [Hydrocodone-Acetaminophen] Other (See Comments)    Chest Pain   . Wheat Bran   . Amoxicillin Nausea And Vomiting    Reaction:  Migraine headache  . Penicillins Nausea And Vomiting    Migraine Headaches    Past Medical History:  Diagnosis Date  . Allergy   . Anemia    anemia in the past  . Angina    takes Propanolol prn and it stops her chest  . Anxiety   . Barrett esophagus    not consistently  present  . Cataract   . Chronic kidney disease    kidney stones and UTI  . Complication of anesthesia    either  BP or pulse dropped with last surgery  . DDD (degenerative disc disease)    cervical and lumbar  . Dementia   . Depression    post traumatic stress  disorder  . Diabetes mellitus    sugar goes up and down diet controlled  . Dysrhythmia    brought on by stress  . External hemorrhoids   . Fatigue   . Fibromyalgia    neuropathy knees ankles and toes, bladder  . GERD (gastroesophageal reflux disease)   . H/O hyperparathyroidism   . Headache(784.0)    migraines  . Hiatal hernia   . History of kidney stones   . Hypertension    3 small brain aneurysms  . IBS (irritable bowel syndrome)   . Internal hemorrhoids   . Macular degeneration   . Migraines   . Osteoarthritis    all over  . Osteopetrosis   . Peripheral vascular disease (HCC)    superficial phlebitis in left calf  . Personal history of colonic polyps 07/22/2013   07/2013 - 3 small adenomas - repeat colonoscopy 2018    Past Surgical History:  Procedure Laterality Date  . ABDOMINAL HYSTERECTOMY    . APPENDECTOMY    . CARDIAC CATHETERIZATION  03/22/1991   EF 61%  . CARDIOVASCULAR STRESS TEST  10/03/2006  . CHOLECYSTECTOMY    . COLONOSCOPY  06/23/2008   normal  . DILATION AND CURETTAGE OF UTERUS     x2  . ESOPHAGUS SURGERY    . EXPLORATORY LAPAROTOMY  1993  . GASTRIC BYPASS  1999  . GASTRIC RESTRICTION SURGERY  1991  . Right Arm Surgery    . Right Knee Arthroscopy    .  Rt wrist fx  2009  . stomach stappeling  1991  . STONE EXTRACTION WITH BASKET  2012  . TONSILLECTOMY    . TUBAL LIGATION    . UPPER GASTROINTESTINAL ENDOSCOPY  06/23/2008   w/Dil, Barrett's esophagus  . US ECHOCARDIOGRAPHY  01/21/2007   EF 55-60%  . US ECHOCARDIOGRAPHY  08/30/2003   EF 55-60%    Social History   Tobacco Use  Smoking Status Never Smoker  Smokeless Tobacco Never Used    Social History   Substance and Sexual  Activity  Alcohol Use No    Family History  Problem Relation Age of Onset  . Stroke Mother   . Lung cancer Mother   . Heart disease Mother   . Diabetes Mother   . Hypertension Father   . Stroke Father   . Lung cancer Father   . Heart disease Father   . Kidney disease Father   . Seizures Father        epilepsy, and sisters x 2  . Pancreatic cancer Sister   . Lung cancer Sister   . Colon cancer Maternal Aunt        12 relatives  . Uterine cancer Unknown        aunt  . Heart disease Unknown        grandmother  . Clotting disorder Sister   . Heart disease Sister        x 2  . Kidney disease Sister        x 2  . Breast cancer Sister   . Colon cancer Paternal Aunt   . Colon cancer Paternal Aunt     Reviw of Systems:  Reviewed in the HPI.  All other systems are negative.  Physical Exam: Blood pressure (!) 80/52, pulse 76, resp. rate 16, height 5\' 5"  (1.651 m), weight 116 lb 6.4 oz (52.8 kg), SpO2 97 %.  GEN:  Well nourished, well developed in no acute distress HEENT: Normal NECK: No JVD; No carotid bruits LYMPHATICS: No lymphadenopathy CARDIAC: RR, no murmurs, rubs, gallops RESPIRATORY:  Clear to auscultation without rales, wheezing or rhonchi  ABDOMEN: Soft, non-tender, non-distended MUSCULOSKELETAL:  No edema; No deformity  SKIN: Warm and dry NEUROLOGIC:  Alert and oriented x 3   ECG:   Assessment / Plan:   1.  Orthostatic hypotension: Tye Maryland presents with symptoms of orthostatic hypotension as well as palpitations. She has continued to lose weight and has lost about 40 pounds over the past 10 years.  She is not eating enough protein or salt.  Urged her to eat more protein and more salt.  Her blood pressure is quite low today.  I think this will also help with her palpitations.  2.  Chest pain: She is at episodes of chest discomfort for years.  These do not sound any different.  2. Palpitations: She continues to have occasional palpitations.  There is a  chance that she might have dysautonomia.  I have encouraged her to eat more salt and protein in order to normalize her blood pressure.    Mertie Moores, MD  11/20/2016 11:10 AM    [address],  [place], [place]  [address] Pager [phone] Phone: [phone]; Fax: [phone]

## 2016-11-22 NOTE — Telephone Encounter (Signed)
Dr. Brigitte Pulse patient; will forward.

## 2016-11-22 NOTE — Telephone Encounter (Signed)
There are several different options of continuous blood glucose monitoring - she should check with her insurance to see if they cover any and at what copay - like the [place] - if they do, I'll be happy to send in a rx to her pharmacy.    That's fine to get the freestyle - I just wrote a rx for generic test strips - does not specify type so why can't the company just use that? If not, fine to write rxs with the exact same sig - the reasons for testing, ICD-10 codes are all on the sig - just copy and paste. Dispense: 300 (or 5 billion or however many) with 50 trillion refills.  Or can hand write rx when I am in office next week.  Does she need a new meter sent in as well or already have a freestyle? Fine to rx prn.  Does she want them sent in to [facility]?

## 2016-11-26 ENCOUNTER — Ambulatory Visit: Payer: Self-pay | Admitting: Psychiatry

## 2016-11-29 ENCOUNTER — Encounter: Payer: Self-pay | Admitting: Dietician

## 2016-11-29 ENCOUNTER — Encounter: Payer: Medicare Other | Attending: Endocrinology | Admitting: Dietician

## 2016-11-29 ENCOUNTER — Telehealth: Payer: Self-pay | Admitting: Dietician

## 2016-11-29 DIAGNOSIS — E162 Hypoglycemia, unspecified: Secondary | ICD-10-CM

## 2016-11-29 DIAGNOSIS — Z713 Dietary counseling and surveillance: Secondary | ICD-10-CM | POA: Insufficient documentation

## 2016-11-29 DIAGNOSIS — R634 Abnormal weight loss: Secondary | ICD-10-CM

## 2016-11-29 DIAGNOSIS — E440 Moderate protein-calorie malnutrition: Secondary | ICD-10-CM

## 2016-11-29 DIAGNOSIS — E119 Type 2 diabetes mellitus without complications: Secondary | ICD-10-CM | POA: Diagnosis not present
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
## 2016-11-29 NOTE — Patient Instructions (Addendum)
With Acarbose, if you get a low, treat a low with  Dextrose tablets OR 1 cup of milk OR 1 Tablespoon Honey  Consider [place] or a rehabilitative yoga  Consider the gut brain connection.  Discuss with your counselor.  Eat in a calm environment.  Do things each day to help you relax and bring you job.  Consider alternatives to coffee. Consider Unjury protein shake ([place] by [facility] or online)  Your breakfast is tolerated well. Consider keeping a food journal to note what foods you react to.  Also, note your emotion during that time.  Try to eat every 3 hours (nutrition density and always with protein.). Consider limiting carbohydrates with your meals to 15-30 grams to avoid the spike then drop of blood sugar post meal. Be sure to chew your food very thoroughly.

## 2016-11-29 NOTE — Telephone Encounter (Signed)
Called patient to inform her that a prescription request for the [place] is being forwarded to Dr. Loanne Drilling. She request that this go to [facility].  Antonieta Iba, RD, LDN, CDE

## 2016-12-01 ENCOUNTER — Other Ambulatory Visit: Payer: Self-pay | Admitting: Endocrinology

## 2016-12-01 MED ORDER — FREESTYLE LIBRE 14 DAY READER DEVI: 1.0000 | 3 refills | Status: DC

## 2016-12-01 MED ORDER — FREESTYLE LIBRE READER DEVI: 1.0000 | Freq: Once | 0 refills | Status: DC

## 2016-12-01 MED ORDER — FREESTYLE LIBRE READER DEVI
1.0000 | Freq: Once | 0 refills | Status: DC
Start: 2016-12-01 — End: 2018-10-30

## 2016-12-06 NOTE — Telephone Encounter (Signed)
Pt is requesting you to call her regarding an option for insoles related to her diabetic neuropathy.  Also asking for refill of Zofran but I've advised her to f/u with her pharmacy as there should be refills available.

## 2016-12-06 NOTE — Telephone Encounter (Signed)
Copied from [place] [phone]. Topic: Quick Communication - See Telephone Encounter >> Dec 06, 2016 12:01 PM Patrice Paradise wrote: Patient is requesting a refill on the following Rx, ondansetron (ZOFRAN) 8 MG tablet. Patient is also requesting a referral to get inserts because her pain in her feet is getting worse, she stated that pain meds are not working and she has gone to pain mgmt. She stated that someone suggested getting inserts. Patient would like for Dr. Brigitte Pulse to call her to discuss.      CRM for notification. See Telephone encounter for: 12/06/16.

## 2016-12-06 NOTE — Telephone Encounter (Signed)
I don't know anything about insoles or inserts. When pt have foot pain that I think might be improved by better footware, we refer then to PT or OT who can usually make some or a better bet would be podiatry referral - insurance will often cover a pair of diabetic shoes every 2 yrs for pts w/ diabetic neuropathy so I send pts to the podiatrist for this.  Or she could go to a nice shoe store that offers free foot imaging and recommendations - like Museum/gallery exhibitions officer.   Let me know if she would like referral to PT for inserts/orthotics or podiatry for orthotics/DM shoes

## 2016-12-09 ENCOUNTER — Ambulatory Visit: Payer: Self-pay | Admitting: Endocrinology

## 2016-12-09 NOTE — Telephone Encounter (Signed)
Pt would like referral for podiatrist.

## 2016-12-10 ENCOUNTER — Ambulatory Visit: Payer: Self-pay | Admitting: Psychiatry

## 2016-12-12 NOTE — Telephone Encounter (Signed)
Referral placed.

## 2016-12-12 NOTE — Addendum Note (Signed)
Addended by: Shawnee Knapp on: 12/12/2016 10:38 AM   Modules accepted: Orders

## 2016-12-13 ENCOUNTER — Ambulatory Visit: Payer: Self-pay | Admitting: Dietician

## 2016-12-24 ENCOUNTER — Ambulatory Visit: Payer: Self-pay | Admitting: Psychiatry

## 2016-12-26 ENCOUNTER — Telehealth: Payer: Self-pay | Admitting: Neurology

## 2016-12-26 MED ORDER — PREGABALIN 50 MG PO CAPS: 50.0000 mg | ORAL_CAPSULE | Freq: Three times a day (TID) | ORAL | 3 refills | Status: DC

## 2016-12-26 NOTE — Telephone Encounter (Signed)
Pt called and needs a refill of her lyraca called in

## 2016-12-27 ENCOUNTER — Encounter: Payer: Self-pay | Admitting: Podiatry

## 2016-12-27 ENCOUNTER — Other Ambulatory Visit: Payer: Self-pay | Admitting: Family Medicine

## 2016-12-27 ENCOUNTER — Ambulatory Visit (INDEPENDENT_AMBULATORY_CARE_PROVIDER_SITE_OTHER): Payer: Medicare Other

## 2016-12-27 ENCOUNTER — Ambulatory Visit (INDEPENDENT_AMBULATORY_CARE_PROVIDER_SITE_OTHER): Payer: Medicare Other | Admitting: Podiatry

## 2016-12-27 VITALS — BP 100/59 | HR 81 | Ht 65.0 in | Wt 110.0 lb

## 2016-12-27 DIAGNOSIS — M129 Arthropathy, unspecified: Secondary | ICD-10-CM | POA: Diagnosis not present

## 2016-12-27 DIAGNOSIS — M722 Plantar fascial fibromatosis: Secondary | ICD-10-CM

## 2016-12-27 DIAGNOSIS — M205X9 Other deformities of toe(s) (acquired), unspecified foot: Secondary | ICD-10-CM

## 2016-12-27 DIAGNOSIS — E1342 Other specified diabetes mellitus with diabetic polyneuropathy: Secondary | ICD-10-CM

## 2016-12-27 DIAGNOSIS — M2020 Hallux rigidus, unspecified foot: Secondary | ICD-10-CM

## 2016-12-27 MED ORDER — MELOXICAM 15 MG PO TABS: 15.0000 mg | ORAL_TABLET | Freq: Every day | ORAL | 0 refills | Status: DC

## 2016-12-27 NOTE — Telephone Encounter (Signed)
Please advise 

## 2016-12-27 NOTE — Telephone Encounter (Addendum)
Copied from [place] [phone]. Topic: Quick Communication - See Telephone Encounter >> Dec 06, 2016 12:01 PM Patrice Paradise wrote: Patient is requesting a refill on the following Rx, ondansetron (ZOFRAN) 8 MG tablet. Patient is also requesting a referral to get inserts because her pain in her feet is getting worse, she stated that pain meds are not working and she has gone to pain mgmt. She stated that someone suggested getting inserts. Patient would like for Dr. Brigitte Pulse to call her to discuss.  PS:pt did call 11/23, but I do not see a telephone encounter from 11/23 , only a crm was created)

## 2016-12-27 NOTE — Telephone Encounter (Signed)
Copied from [place] [phone]. Topic: Quick Communication - See Telephone Encounter >> Dec 27, 2016  3:03 PM Kayla, [name] wrote: Pt called back because she had not heard anything. Pt states she never got the Rx for ondansetron (ZOFRAN) 8 MG tablet [facility]. But hoping to get a 30 day of [facility].  Pt states she is so nauseous today and needs asap. Pt will fu with [facility] and see what happened to the Rx

## 2016-12-27 NOTE — Telephone Encounter (Deleted)
Copied from [place] [phone]. Topic: Quick Communication - See Telephone Encounter >> Dec 06, 2016 12:01 PM Patrice Paradise wrote: Patient is requesting a refill on the following Rx, ondansetron (ZOFRAN) 8 MG tablet. Patient is also requesting a referral to get inserts because her pain in her feet is getting worse, she stated that pain meds are not working and she has gone to pain mgmt. She stated that someone suggested getting inserts. Patient would like for Dr. Brigitte Pulse to call her to discuss.      CRM for notification. See Telephone encounter for: 12/06/16.

## 2016-12-27 NOTE — Progress Notes (Signed)
   Subjective:    Patient ID: Kayla [name], female    DOB: 1945/07/03, 71 y.o.   MRN: [id number]  HPI this patient presents the office with chief complaint of severe pain noted in her left foot greater than her right foot.  She says that as the day goes by there is a heaviness that sets in her feet .  She says that it becomes extremely painful to walk without pain by the end of the day .  This patient does admit that she has diabetes and diabetic neuropathy.  She says she has taking Neurontin, Lyrica and is presently taking mentanx.  She says that her pain was under control initially but the pain in her feet has worsened over the last 6 months. She denies any history of trauma or injury to the foot.  Her problem list includes peripheral vascular disease and fibromyalgia.  She says she has not had any treatment nor sought any professional help for this condition.  She presents the office today for an evaluation and treatment of her painful feet. Chief Complaint  Patient presents with  . Diabetes  . Peripheral Neuropathy    controlled well until about 6 months ago. Neurontin stopped working. Takes Lyrica 2 to 3 times daily. Pain is shooting, burning, throbbing, tingling pain       Review of Systems  All other systems reviewed and are negative.      Objective:   Physical Exam General Appearance  Alert, conversant and in no acute stress.  Vascular  Dorsalis pedis and posterior pulses are palpable  bilaterally.  Capillary return is within normal limits  Bilaterally. Temperature is within normal limits  Bilaterally  Neurologic  Senn-Weinstein monofilament wire test within normal limits  bilaterally. Muscle power  Within normal limits bilaterally.  Nails normal nails with no evidence of any fungal or bacterial infection..  Orthopedic  No limitations of motion of motion feet bilaterally.  No crepitus or effusions noted.  A functional hallux limitus noted first MPJ of the left greater than the  right.  Pain along the medial band of the plantar fascia of the left foot.  Pain noted on the dorsum of the midfoot of her left foot. Patient has marked forefoot hypermobility.    Skin  normotropic skin with no porokeratosis noted bilaterally.  No signs of infections or ulcers noted.          Assessment & Plan:  Plantar fascitis secondary to hallux limitus 1st MPJ left greater than right.  Midfoot arthritis left foot.  IE  x-rays taken do reveal calcification at the insertion of the plantar fascia right foot.  Marked arthritic changes noted in the midfoot of the left foot.  Dorsal crowning noted at the first MPJ bilaterally. Discussed this condition with this patient.  Discussed arthritic changes in her left midfoot and her limited motion upon weight-bearing big toe joints both feet.  We are to dispense power step insoles for this patient to wear in her shoes.  Prescribed Mobic for this patient.  Patient to return the office in 2-4 weeks for follow-up with Dr. March Rummage.  . Told patient that if the power steps are effective than orthotics made through this office would be considered.   Gardiner Barefoot DPM

## 2016-12-27 NOTE — Addendum Note (Signed)
Addended byDeidre Ala, Bertine Schlottman L on: 12/27/2016 01:53 PM   Modules accepted: Orders

## 2016-12-28 MED ORDER — ONDANSETRON HCL 8 MG PO TABS: 8.0000 mg | ORAL_TABLET | Freq: Three times a day (TID) | ORAL | 0 refills | Status: DC | PRN

## 2016-12-28 MED ORDER — ONDANSETRON HCL 8 MG PO TABS: 8.0000 mg | ORAL_TABLET | Freq: Three times a day (TID) | ORAL | 1 refills | Status: DC | PRN

## 2016-12-28 NOTE — Telephone Encounter (Signed)
Sent [facility] and resent second zofran rx to [facility]

## 2016-12-30 ENCOUNTER — Telehealth: Payer: Self-pay

## 2016-12-30 ENCOUNTER — Other Ambulatory Visit: Payer: Self-pay

## 2016-12-30 MED ORDER — PREGABALIN 50 MG PO CAPS: 50.0000 mg | ORAL_CAPSULE | Freq: Three times a day (TID) | ORAL | 2 refills | Status: DC

## 2016-12-30 NOTE — Telephone Encounter (Signed)
Rcvd VM from PT, she had rqstd a refill of Lyrica last week, I sent it to th [facility] where she rqstd it sent before; however Pt stated in VM, she is out of Lyrica and needs an Rx sent [facility]. Called and LM on VM for Ot to advise [facility].

## 2017-01-21 ENCOUNTER — Ambulatory Visit: Payer: Self-pay | Admitting: Psychiatry

## 2017-01-22 ENCOUNTER — Ambulatory Visit (INDEPENDENT_AMBULATORY_CARE_PROVIDER_SITE_OTHER): Payer: Medicare Other | Admitting: Podiatry

## 2017-01-22 DIAGNOSIS — M792 Neuralgia and neuritis, unspecified: Secondary | ICD-10-CM

## 2017-01-23 ENCOUNTER — Encounter: Payer: Medicare Other | Attending: Endocrinology | Admitting: Dietician

## 2017-01-23 ENCOUNTER — Encounter: Payer: Self-pay | Admitting: Dietician

## 2017-01-23 DIAGNOSIS — E440 Moderate protein-calorie malnutrition: Secondary | ICD-10-CM

## 2017-01-23 DIAGNOSIS — E119 Type 2 diabetes mellitus without complications: Secondary | ICD-10-CM | POA: Diagnosis not present

## 2017-01-23 DIAGNOSIS — Z713 Dietary counseling and surveillance: Secondary | ICD-10-CM | POA: Insufficient documentation

## 2017-01-23 NOTE — Patient Instructions (Addendum)
Go through your vitamins to throw out what is expired.  Options to continue to consider include:   Metanx (Mthly folate B Vitamin)   Vitamin D   Calcium/magnesium   Iron   Probiotic   Omega-3   Tumeric (can help with inflammation)   Alpha Lipoic Acid (200 mg)  Consider Meal planning  Consider small frequent meals   Eat every 3 hours (density, limit carbohydrates to 15-30 grams per meal or snack)  Small amount of protein with each meal Be sure to chew your food very thoroughly.     Consider trying the [place] With Acarbose, if you get a low, treat a low with             Dextrose tablets OR 1 cup of milk OR 1 Tablespoon Honey  Consider [place] or a rehabilitative yoga  Consider the gut brain connection.  Discuss with your counselor.             Eat in a calm environment.  Consider discussing with your counselor need to have a better relationship with food.             Do things each day to help you relax and bring you joy

## 2017-01-24 ENCOUNTER — Encounter: Payer: Self-pay | Admitting: Dietician

## 2017-01-24 NOTE — Progress Notes (Signed)
Medical Nutrition Therapy:  Appt start time: 1115 end time:  1200.  Assessment:  Primary concerns today: Patient returns today for a follow-up related to diabetes and extreme swings in her blood sugar as well as underweight for age with BMI of 81.97.  She is post RYGB in 1999.    Other history includes GERD, CKD, anxiety and depression, GERD, HTN, IBS, Barrett's esophagus, neuropathy, fibromyalgia, intermittent hypoglycemia since 1979. She states that her esophagus is now narrowed and this has required dilation in the past.  Occasionally she chokes on her food. She states she does not always chew her food thoroughly.   She has a history of malnutrition and does meet criteria by appearance with severe depletion of muscle at temples, bones at shoHer ulders protrude.  She also notes decreased strength and increased pain from neuropathy.  Weight history 230 lbs prior to RYGB.  She had been on multiple diets with weight swings and was a big emotional eater.  She states that she continues to be an emotional eater. 156 lbs in 2005. Weight was as low as 97 lbs in the past and now stable at about 115 lbs.  Labs noted:  Vitamin B6, vitamin B-12 09/2016 are acceptable range  Patient lives with her husband.  She does the shopping and cooking.  See 11/29/16 note for more thorough social history.  Since her appointment 11/19/16, she has gained 2 lbs.  She has also started on the Acarbose which has stopped the extreme swings in her blood sugar.  She has an order for the [employer] but needs a letter of need and at this time is not having the extreme lows and is not checking her blood sugar.   She does not that she still has post meal issues of sweating, extreme exaustion and overall poor feeling.  Her meals are sometimes just protein and is fearful to eat at times.  Discussed needing to improve her relationship of food.  She used to meal plan and enjoyed it but currently has not been.  She cooks one good  meal per day.  Her husband has lost from 200 lbs to 170 lbs including muscle and patient is also concerned about his health although her husband feels that he is well.  Her daughter is still living there and they are going to get a travel trailer to put on the property for her and encourage increased independence.  She is overwhelmed at times and has cancelled several appointments with her counselor Claire Shown recently. She reports feeling that she is too thin and would like to gain weight.  MEDICATIONS: see list to include Metanx, Omega-3, probiotic, iron, vitamin D, vitamin C.  She has been inconsistent with her vitamin intake recently.  She has also been taking the Acarbose.  DIETARY INTAKE: Broccoli and cabbage cause increased gas.  Avoids sweets, sugar, alcohol. Allergy to peanuts and wheat but eats both of these. States that she eats too much at times as she is ravenous. 24-hr recall:  B (AM): 2 leftover pork chops this morning  Snk ( AM) :none  L (PM): leftovers  Snk (PM): none D (PM): meat, peas, vegetables  Snk (PM): almonds or walnuts, occasional chips Beverages: 2-3 cups coffee with 2 creamers, water, gatorade, 1/2 cup Pepsi occasionally per 11/2016 appointment  Recent physical activity: none  Estimated energy needs: 1600 calories 180 g carbohydrates 120 g protein 44 g fat  Progress Towards Goal(s):  In progress.   Nutritional Diagnosis:  NB-1.1  Food and nutrition-related knowledge deficit As related to balance of carbohydrate, protein, and fat.  As evidenced by hypo and hyperglycemia.    Intervention:  Nutrition counseling/education related to nutrition with aim for weight gain and increased comfort with food. Reviewed her supplement use and helped prioritize nutrients of most value that were not overlapping with other products.  Reviewed her eating habits and provided suggestions to create more balance in her meal plans. Suggested smaller more frequent meals to see if  this would help her poor feelings post eating.  Discussed benefits of meal planning which she has liked and found benefit in the past.  Go through your vitamins to throw out what is expired.  Options to continue to consider include:   Metanx (Mthly folate B Vitamin)   Vitamin D   Calcium/magnesium   Iron   Probiotic   Omega-3   Tumeric (can help with inflammation)   Alpha Lipoic Acid (200 mg)  Consider Meal planning  Consider small frequent meals   Eat every 3 hours (density, limit carbohydrates to 15-30 grams per meal or snack)  Small amount of protein with each meal Be sure to chew your food very thoroughly.     Consider trying the Unjury With Acarbose, if you get a low, treat a low with             Dextrose tablets OR 1 cup of milk OR 1 Tablespoon Honey  Consider [place] or a rehabilitative yoga  Consider the gut brain connection.  Discuss with your counselor.             Eat in a calm environment.  Consider discussing with your counselor need to have a better relationship with food.             Do things each day to help you relax and bring you joy   Handouts given during visit include:  Will mail patient no smoking class schedule per her request for her daughter  Monitoring/Evaluation:  Dietary intake, exercise, and body weight in 3 month(s).

## 2017-01-31 ENCOUNTER — Other Ambulatory Visit: Payer: Self-pay | Admitting: Podiatry

## 2017-02-04 ENCOUNTER — Ambulatory Visit: Payer: Self-pay | Admitting: Psychiatry

## 2017-02-07 ENCOUNTER — Other Ambulatory Visit: Payer: Self-pay | Admitting: Family Medicine

## 2017-02-07 NOTE — Telephone Encounter (Signed)
Copied from [place] [id number]. Topic: Quick Communication - Rx Refill/Question >> Feb 07, 2017  9:00 AM Waylan Rocher, Lumin L wrote: Medication: ferosimide 40mg , potassium klor-con m20 20mg  tab, diclofenac sodium (VOLTAREN) 1 % GEL    Has the patient contacted their pharmacy? No. (new pharm)   (Agent: If no, request that the patient contact the pharmacy for the refill.)   Preferred Pharmacy (with phone number or street name): [facility] - [place], [place] - [address] [place] [address] Phone: [phone] Fax: [phone]   Agent: Please be advised that RX refills may take up to 3 business days. We ask that you follow-up with your pharmacy.

## 2017-02-07 NOTE — Telephone Encounter (Signed)
furosemide 40 mg - not listed Potassium klor-con 67meq- not listed Voltaren 1% gel- provider review  LOV 10/24/2016  [place]  [phone]

## 2017-02-08 NOTE — Telephone Encounter (Signed)
Provider, please advise.  

## 2017-02-12 MED ORDER — POTASSIUM CHLORIDE CRYS ER 20 MEQ PO TBCR: 20.0000 meq | EXTENDED_RELEASE_TABLET | Freq: Every day | ORAL | 0 refills | Status: DC | PRN

## 2017-02-12 MED ORDER — FUROSEMIDE 40 MG PO TABS: 40.0000 mg | ORAL_TABLET | Freq: Every day | ORAL | 0 refills | Status: DC | PRN

## 2017-02-12 MED ORDER — DICLOFENAC SODIUM 1 % TD GEL: 2.0000 g | Freq: Four times a day (QID) | TRANSDERMAL | 3 refills | Status: DC

## 2017-02-13 NOTE — Patient Instructions (Addendum)

## 2017-02-13 NOTE — Progress Notes (Signed)
  Subjective:  Patient ID: Kayla [name], female    DOB: Feb 20, 1945,  MRN: [id number]  No chief complaint on file.  72 y.o. female returns for the above complaint.  Reports problems with her feet states that she feels alternating cold hot, tingling, pins and needles.  States that it all over her feet..  Has known diabetic neuropathy and has seen Dr. Prudence Davidson for this issue.  Has been taking Neurontin and Lyrica and Metanx.  States that this is still present.  Objective:  There were no vitals filed for this visit. General AA&O x3. Normal mood and affect.  Vascular Pedal pulses palpable.  Neurologic Epicritic sensation grossly diminished.  Dermatologic No open lesions. Skin normal texture and turgor.  Orthopedic: No pain to palpation either foot.   Assessment & Plan:  Patient was evaluated and treated and all questions answered.  Intractable neuropathy -Discussed limited available treatment options -Rx compound neuropathy pain -Should issues persist would consider referral to neurology  No Follow-up on file.

## 2017-02-14 DIAGNOSIS — M47812 Spondylosis without myelopathy or radiculopathy, cervical region: Secondary | ICD-10-CM | POA: Diagnosis not present

## 2017-02-14 DIAGNOSIS — M170 Bilateral primary osteoarthritis of knee: Secondary | ICD-10-CM | POA: Diagnosis not present

## 2017-02-14 DIAGNOSIS — G894 Chronic pain syndrome: Secondary | ICD-10-CM | POA: Diagnosis not present

## 2017-02-14 DIAGNOSIS — R51 Headache: Secondary | ICD-10-CM | POA: Diagnosis not present

## 2017-02-14 DIAGNOSIS — G5602 Carpal tunnel syndrome, left upper limb: Secondary | ICD-10-CM | POA: Diagnosis not present

## 2017-02-14 DIAGNOSIS — M797 Fibromyalgia: Secondary | ICD-10-CM | POA: Diagnosis not present

## 2017-02-18 ENCOUNTER — Ambulatory Visit: Payer: Self-pay | Admitting: Psychiatry

## 2017-03-04 ENCOUNTER — Ambulatory Visit: Payer: Self-pay | Admitting: Psychiatry

## 2017-03-06 ENCOUNTER — Encounter: Payer: Self-pay | Admitting: Family Medicine

## 2017-03-06 ENCOUNTER — Ambulatory Visit (INDEPENDENT_AMBULATORY_CARE_PROVIDER_SITE_OTHER): Payer: Medicare Other | Admitting: Family Medicine

## 2017-03-06 ENCOUNTER — Other Ambulatory Visit: Payer: Self-pay

## 2017-03-06 ENCOUNTER — Ambulatory Visit (HOSPITAL_COMMUNITY)
Admission: RE | Admit: 2017-03-06 | Discharge: 2017-03-06 | Disposition: A | Discharge status: Home / Self Care | Payer: Medicare Other | Source: Ambulatory Visit | Attending: Family Medicine | Admitting: Family Medicine

## 2017-03-06 VITALS — BP 95/54 | HR 79 | Temp 98.6°F | Resp 16 | Ht 65.0 in | Wt 112.6 lb

## 2017-03-06 DIAGNOSIS — G44329 Chronic post-traumatic headache, not intractable: Secondary | ICD-10-CM

## 2017-03-06 DIAGNOSIS — R4781 Slurred speech: Secondary | ICD-10-CM

## 2017-03-06 DIAGNOSIS — R29818 Other symptoms and signs involving the nervous system: Secondary | ICD-10-CM | POA: Diagnosis not present

## 2017-03-06 DIAGNOSIS — R9089 Other abnormal findings on diagnostic imaging of central nervous system: Secondary | ICD-10-CM | POA: Diagnosis not present

## 2017-03-06 DIAGNOSIS — R251 Tremor, unspecified: Secondary | ICD-10-CM

## 2017-03-06 DIAGNOSIS — E440 Moderate protein-calorie malnutrition: Secondary | ICD-10-CM

## 2017-03-06 DIAGNOSIS — E11649 Type 2 diabetes mellitus with hypoglycemia without coma: Secondary | ICD-10-CM

## 2017-03-06 DIAGNOSIS — I951 Orthostatic hypotension: Secondary | ICD-10-CM | POA: Diagnosis not present

## 2017-03-06 DIAGNOSIS — R42 Dizziness and giddiness: Secondary | ICD-10-CM | POA: Diagnosis not present

## 2017-03-06 DIAGNOSIS — Z820 Family history of epilepsy and other diseases of the nervous system: Secondary | ICD-10-CM

## 2017-03-06 DIAGNOSIS — M6240 Contracture of muscle, unspecified site: Secondary | ICD-10-CM

## 2017-03-06 LAB — POCT CBC
Granulocyte percent: 50.7 %G (ref 37–80)
HCT, POC: 40 % (ref 37.7–47.9)
Hemoglobin: 13.2 g/dL (ref 12.2–16.2)
LYMPH, POC: 3 (ref 0.6–3.4)
MCH, POC: 30.6 pg (ref 27–31.2)
MCHC: 33.1 g/dL (ref 31.8–35.4)
MCV: 92.4 fL (ref 80–97)
MID (cbc): 0.5 (ref 0–0.9)
MPV: 7.7 fL (ref 0–99.8)
PLATELET COUNT, POC: 210 10*3/uL (ref 142–424)
POC Granulocyte: 3.6 (ref 2–6.9)
POC LYMPH PERCENT: 42.1 %L (ref 10–50)
POC MID %: 7.2 %M (ref 0–12)
RBC: 4.33 M/uL (ref 4.04–5.48)
RDW, POC: 13.4 %
WBC: 7.1 10*3/uL (ref 4.6–10.2)

## 2017-03-06 LAB — POCT URINALYSIS DIP (MANUAL ENTRY)
BILIRUBIN UA: NEGATIVE
Blood, UA: NEGATIVE
GLUCOSE UA: NEGATIVE mg/dL
Ketones, POC UA: NEGATIVE mg/dL
LEUKOCYTES UA: NEGATIVE
NITRITE UA: NEGATIVE
Spec Grav, UA: 1.015 (ref 1.010–1.025)
UROBILINOGEN UA: 2 U/dL — AB
pH, UA: 7 (ref 5.0–8.0)

## 2017-03-06 LAB — POC MICROSCOPIC URINALYSIS (UMFC): MUCUS RE: ABSENT

## 2017-03-06 LAB — GLUCOSE, POCT (MANUAL RESULT ENTRY): POC GLUCOSE: 89 mg/dL (ref 70–99)

## 2017-03-06 LAB — POCT SEDIMENTATION RATE: POCT SED RATE: 6 mm/h (ref 0–22)

## 2017-03-06 LAB — POCT GLYCOSYLATED HEMOGLOBIN (HGB A1C): HEMOGLOBIN A1C: 5.9

## 2017-03-06 MED ORDER — GADOBENATE DIMEGLUMINE 529 MG/ML IV SOLN
10.0000 mL | Freq: Once | INTRAVENOUS | Status: AC | PRN
  Administered 2017-03-06: 10 mL via INTRAVENOUS

## 2017-03-06 NOTE — Progress Notes (Addendum)
Subjective:  By signing my name below, I, [name], attest that this documentation has been prepared under the direction and in the presence of [name], MD Electronically Signed: [name], ED Scribe 03/06/2017 at 2:10 PM.   Patient ID: Kayla [name], female    DOB: 18-Sep-1945, 72 y.o.   MRN: [id number]  Chief Complaint  Patient presents with  . Hand Problem    per patient shaky x 6 months   HPI Kayla [name] is a 72 y.o. female who presents to Primary Care at [facility] complaining of tremors in hands x 6 months, worsened over the last wk. Pt states her hands and head involuntary jerk, so it's difficult to type on her computer and her cell phone. She reports that she has dropped silverware, plates, magazines, pen, and a pan of grease this morning. She has also noticed lightheadedness and impaired thinking since yesterday, worse today. States she has had to hold onto walls to kept from falling recently. Pt is no longer taking Wellbutrin. She has not checked her blood glucose. Pt suspects that symptoms are from a recent increase in her Lyrica to 100 mg ~1 month ago for pain in feet, neuropathy in her hands as she occasionally experiences pins/needles sensations in her hands or Parkinson's since her father had it. Pt has only seen Dr. Tomi Likens once.  Pt has not seen an eye doctor in a while. She has noticed blurred vision, even with her glasses. States she has to use a flashlight to see. Denies seeing halos at night.  Past Medical History:  Diagnosis Date  . Allergy   . Anemia    anemia in the past  . Angina    takes Propanolol prn and it stops her chest  . Anxiety   . Barrett esophagus    not consistently present  . Cataract   . Chronic kidney disease    kidney stones and UTI  . Complication of anesthesia    either  BP or pulse dropped with last surgery  . DDD (degenerative disc disease)    cervical and lumbar  . Dementia   . Depression    post traumatic stress  disorder   . Diabetes mellitus    sugar goes up and down diet controlled  . Dysrhythmia    brought on by stress  . External hemorrhoids   . Fatigue   . Fibromyalgia    neuropathy knees ankles and toes, bladder  . GERD (gastroesophageal reflux disease)   . H/O hyperparathyroidism   . Headache(784.0)    migraines  . Hiatal hernia   . History of kidney stones   . Hypertension    3 small brain aneurysms  . IBS (irritable bowel syndrome)   . Internal hemorrhoids   . Macular degeneration   . Migraines   . Osteoarthritis    all over  . Osteopetrosis   . Peripheral vascular disease (HCC)    superficial phlebitis in left calf  . Personal history of colonic polyps 07/22/2013   07/2013 - 3 small adenomas - repeat colonoscopy 2018   Current Outpatient Medications on File Prior to Visit  Medication Sig Dispense Refill  . acarbose (PRECOSE) 25 MG tablet Take 1 tablet (25 mg total) by mouth 3 (three) times daily with meals. 90 tablet 5  . aspirin-acetaminophen-caffeine (EXCEDRIN MIGRAINE) 250-250-65 MG tablet Take 2 tablets by mouth 2 (two) times daily.    . beta carotene w/minerals (OCUVITE) tablet Take 1 tablet by mouth 2 (  two) times daily.    . Cholecalciferol (VITAMIN D3) 2000 UNITS TABS Take 2,000 Units by mouth daily.    . clonazePAM (KLONOPIN) 1 MG tablet Take 0.5-1 tablets (0.5-1 mg total) by mouth 2 (two) times daily as needed for anxiety. 180 tablet 1  . Cyanocobalamin (VITAMIN B-12) 1000 MCG SUBL Place 5,000 mcg under the tongue daily.     . cyclobenzaprine (FLEXERIL) 10 MG tablet Take 1 tablet (10 mg total) by mouth 3 (three) times daily as needed for muscle spasms. 270 tablet 1  . cycloSPORINE (RESTASIS) 0.05 % ophthalmic emulsion Place 1 drop into both eyes 2 (two) times daily. 5.5 mL 5  . diclofenac sodium (VOLTAREN) 1 % GEL Apply 2 g topically 4 (four) times daily. 700 g 3  . dicyclomine (BENTYL) 10 MG capsule Take 1-2 tab 3 times daily AC as needed for spasms and cramping. 270 capsule 1    . diltiazem 2 % GEL Apply 1 application topically 2 (two) times daily as needed (pain from fissure). 30 g 2  . docusate sodium (COLACE) 250 MG capsule Take 1 capsule (250 mg total) by mouth daily. 90 capsule 3  . esomeprazole (NEXIUM) 40 MG capsule Take 1 capsule (40 mg total) by mouth daily before breakfast. 90 capsule 3  . fentaNYL (DURAGESIC - DOSED MCG/HR) 50 MCG/HR Place 50 mcg onto the skin every 3 (three) days.    . furosemide (LASIX) 40 MG tablet Take 1 tablet (40 mg total) by mouth daily as needed for edema. 90 tablet 0  . HYDROcodone-acetaminophen (NORCO) 7.5-325 MG tablet Take 1 tablet every 6 (six) hours as needed by mouth for moderate pain.    . hypromellose (SYSTANE OVERNIGHT THERAPY) 0.3 % GEL ophthalmic ointment Place 1 drop into both eyes at bedtime. Reported on 02/11/2015    . IRON PO Take by mouth.    Marland Kitchen L-Methylfolate-Algae-B12-B6 (METANX) 3-90.314-2-35 MG CAPS Take 1 capsule 2 (two) times daily by mouth.    . lidocaine (LIDODERM) 5 % Place 1 patch onto the skin as needed. Remove & Discard patch within 12 hours. May dispense as 3 month supply 90 patch 3  . mirabegron ER (MYRBETRIQ) 25 MG TB24 tablet Take 1 tablet (25 mg total) by mouth daily. 90 tablet 3  . mupirocin ointment (BACTROBAN) 2 % Apply 1 application topically 3 (three) times daily. 30 g 1  . Naloxone HCl 0.4 MG/0.4ML SOAJ Administer 0.4mg  [place] at first sign of opioid overdose and repeat every 2 minutes as needed for resuscitation. Call 911 immediately 5 Package 0  . NON FORMULARY [facility]  Peripheral Neuropathy Cream- Bupivacaine 1%, Doxepin 3%, Gabapentin 6%, Pentoxifylline 3%, Topiramate 1% Apply 1-2 grams to affected area 3-4 times daily Qty. 120 gm 3 refills    . Omega-3 Fatty Acids (SEA-OMEGA PO) Take 1,000 mg by mouth daily.    . ondansetron (ZOFRAN) 8 MG tablet Take 1 tablet (8 mg total) by mouth every 8 (eight) hours as needed for nausea or vomiting. 60 tablet 1  . potassium chloride SA  (K-DUR,KLOR-CON) 20 MEQ tablet Take 1 tablet (20 mEq total) by mouth daily as needed (along with furosemide whenever it is used for edema). 90 tablet 0  . pregabalin (LYRICA) 50 MG capsule Take 1 capsule (50 mg total) by mouth 3 (three) times daily. 270 capsule 0  . Probiotic Product (PROBIOTIC-10 PO) Take by mouth.    . pyridoxine (B-6) 200 MG tablet Take 200 mg by mouth daily.    Marland Kitchen  SUMAtriptan (IMITREX) 100 MG tablet Take 1 tablet at earliest onset of headache, may repeat in 2 hours if headache persists or reoccurs. (Patient taking differently: Take 100 mg by mouth every 3 (three) days. ) 30 tablet 1  . traZODone (DESYREL) 100 MG tablet Take 0.5-1 tablets (50-100 mg total) by mouth at bedtime as needed for sleep. And 1/2 -1 if wakes at 4am. 90 tablet 1  . Vitamin D, Ergocalciferol, (DRISDOL) 50000 units CAPS capsule Take 1 capsule (50,000 Units total) by mouth every 7 (seven) days. 12 capsule 0  . Ascorbic Acid (VITAMIN C) 1000 MG tablet Take 1,000 mg by mouth daily.    Marland Kitchen CALCIUM-MAGNESIUM-ZINC PO Take by mouth daily.    . Continuous Blood Gluc Receiver (FREESTYLE LIBRE 14 DAY READER) DEVI 1 Device every 14 (fourteen) days by Does not apply route. (Patient not taking: Reported on 01/23/2017) 6 Device 3  . glucose blood test strip Use to check cbgs 6 times daily as directed by physician. Testing freq: hypoglycemia, hyperglycemia. ICD10: R73.03, E 16.2 600 each 5  . Lifitegrast (XIIDRA) 5 % SOLN Place 1 drop into both eyes daily.     . meloxicam (MOBIC) 15 MG tablet TAKE 1 TABLET BY MOUTH EVERY DAY (Patient not taking: Reported on 03/06/2017) 30 tablet 0  . ondansetron (ZOFRAN) 8 MG tablet Take 1 tablet (8 mg total) by mouth every 8 (eight) hours as needed for nausea or vomiting. 20 tablet 0  . ONE TOUCH LANCETS MISC Use to check cbgs 6 times daily as directed by physician. Testing freq: hypoglycemia, hyperglycemia. ICD10: R73.03, E 16.2 600 each 5  . pregabalin (LYRICA) 50 MG capsule Take 1 capsule (50  mg total) by mouth 3 (three) times daily. 90 capsule 2  . pregabalin (LYRICA) 50 MG capsule Take 1 capsule (50 mg total) by mouth 3 (three) times daily. 90 capsule 3  . pregabalin (LYRICA) 50 MG capsule Take 1 capsule (50 mg total) by mouth 3 (three) times daily. 90 capsule 2  . propranolol (INDERAL) 10 MG tablet Take 2.5 mg (1/4 tab) daily for palpitations and anxiety. (Patient not taking: Reported on 11/29/2016) 23 tablet 1  . thiamine (VITAMIN B-1) 100 MG tablet Take 100 mg daily by mouth.    . [DISCONTINUED] buPROPion (WELLBUTRIN) 75 MG tablet Take 75 mg by mouth daily after breakfast.      No current facility-administered medications on file prior to visit.    Allergies  Allergen Reactions  . Ambien [Zolpidem Tartrate]     Hallucinations  . Codeine Anaphylaxis  . Oxycodone-Acetaminophen Shortness Of Breath  . Tylox [Oxycodone-Acetaminophen] Anaphylaxis    Chest pain  . Darvocet [Propoxyphene N-Acetaminophen]     Chest pain  . Darvon [Propoxyphene] Itching  . Food Swelling    wheat-  . Lorcet [Hydrocodone-Acetaminophen]     Chest pain  . Opium     hallucinations  . Vicodin [Hydrocodone-Acetaminophen] Other (See Comments)    Chest Pain   . Wheat Bran   . Amoxicillin Nausea And Vomiting    Reaction:  Migraine headache  . Penicillins Nausea And Vomiting    Migraine Headaches   Past Surgical History:  Procedure Laterality Date  . ABDOMINAL HYSTERECTOMY    . APPENDECTOMY    . CARDIAC CATHETERIZATION  03/22/1991   EF 61%  . CARDIOVASCULAR STRESS TEST  10/03/2006  . CHOLECYSTECTOMY    . COLONOSCOPY  06/23/2008   normal  . DILATION AND CURETTAGE OF UTERUS     x2  .  ESOPHAGUS SURGERY    . EXPLORATORY LAPAROTOMY  1993  . GASTRIC BYPASS  1999  . GASTRIC RESTRICTION SURGERY  1991  . Right Arm Surgery    . Right Knee Arthroscopy    . Rt wrist fx  2009  . stomach stappeling  1991  . STONE EXTRACTION WITH BASKET  2012  . TONSILLECTOMY    . TUBAL LIGATION    . UPPER  GASTROINTESTINAL ENDOSCOPY  06/23/2008   w/Dil, Barrett's esophagus  . US ECHOCARDIOGRAPHY  01/21/2007   EF 55-60%  . US ECHOCARDIOGRAPHY  08/30/2003   EF 55-60%   Family History  Problem Relation Age of Onset  . Stroke Mother   . Lung cancer Mother   . Heart disease Mother   . Diabetes Mother   . Hypertension Father   . Stroke Father   . Lung cancer Father   . Heart disease Father   . Kidney disease Father   . Seizures Father        epilepsy, and sisters x 2  . Pancreatic cancer Sister   . Lung cancer Sister   . Colon cancer Maternal Aunt        12 relatives  . Uterine cancer Unknown        aunt  . Heart disease Unknown        grandmother  . Clotting disorder Sister   . Heart disease Sister        x 2  . Kidney disease Sister        x 2  . Breast cancer Sister   . Colon cancer Paternal Aunt   . Colon cancer Paternal Aunt    Social History   Socioeconomic History  . Marital status: Married    Spouse name: Rushie Chestnut.  . Number of children: 2  . Years of education: None  . Highest education level: None  Social Needs  . Financial resource strain: None  . Food insecurity - worry: None  . Food insecurity - inability: None  . Transportation needs - medical: None  . Transportation needs - non-medical: None  Occupational History  . Occupation: Disability    Employer: RETIRED  Tobacco Use  . Smoking status: Never Smoker  . Smokeless tobacco: Never Used  Substance and Sexual Activity  . Alcohol use: No  . Drug use: No  . Sexual activity: Not Currently  Other Topics Concern  . None  Social History Narrative   She retired on disability   Married   2 Children   Depression screen [facility] 2/9 03/06/2017 01/23/2017 11/29/2016 10/24/2016 10/04/2016  Decreased Interest 0 0 0 0 0  Down, Depressed, Hopeless 0 1 0 0 0  PHQ - 2 Score 0 1 0 0 0  Altered sleeping - - - - 3  Tired, decreased energy - - - - 3  Change in appetite - - - - 3  Feeling bad or failure about yourself  -  - - - 1  Trouble concentrating - - - - 3  Moving slowly or fidgety/restless - - - - 0  Suicidal thoughts - - - - 0  PHQ-9 Score - - - - 13  Difficult doing work/chores - - - - Somewhat difficult  Some recent data might be hidden    Review of Systems  Eyes: Positive for visual disturbance.  Neurological: Positive for tremors and light-headedness.  Psychiatric/Behavioral: Positive for confusion.      Objective:   Physical Exam  Constitutional: She is oriented  to person, place, and time. She appears well-developed and well-nourished. No distress.  HENT:  Head: Normocephalic and atraumatic.  Right Ear: Tympanic membrane normal.  Left Ear: Tympanic membrane normal. Left ear middle ear effusion: small.  Nose: Nose normal.  Mouth/Throat: Oropharynx is clear and moist and mucous membranes are normal.  Eyes: Conjunctivae and EOM are normal.  Normal funduscopic exam.  Neck: Neck supple. No tracheal deviation present.  Cardiovascular: Normal rate, regular rhythm and normal heart sounds.  Pulmonary/Chest: Effort normal and breath sounds normal. No respiratory distress.  Musculoskeletal: Normal range of motion.  Neurological: She is alert and oriented to person, place, and time. No cranial nerve deficit. She displays a negative Romberg sign.  Reflex Scores:      Tricep reflexes are 2+ on the right side and 2+ on the left side.      Bicep reflexes are 2+ on the right side and 2+ on the left side.      Patellar reflexes are 2+ on the right side and 2+ on the left side. Normal finger to nose. Normal rapid alternating movement. Shoulder girdle weakness 4+/5. Very mild pronator drift on the R. Tandem gait imbalance when dependent on R foot. Unable to hold one legged stance.  Skin: Skin is warm and dry.  Psychiatric: She has a normal mood and affect. Her behavior is normal.  Nursing note and vitals reviewed.  BP (!) 95/54 (BP Location: Right Arm, Patient Position: Sitting, Cuff Size: Normal)    Pulse 79   Temp 98.6 F (37 C) (Oral)   Resp 16   Ht 5\' 5"  (1.651 m)   Wt 112 lb 9.6 oz (51.1 kg)   SpO2 96%   BMI 18.74 kg/m     Orthostatic VS for the past 24 hrs:  BP- Lying Pulse- Lying BP- Sitting Pulse- Sitting BP- Standing at 0 minutes Pulse- Standing at 0 minutes  03/06/17 1433 103/65 73 99/63 73 96/62 76   Results for orders placed or performed in visit on 03/06/17  POCT urinalysis dipstick  Result Value Ref Range   Color, UA yellow yellow   Clarity, UA clear clear   Glucose, UA negative negative mg/dL   Bilirubin, UA negative negative   Ketones, POC UA negative negative mg/dL   Spec Grav, UA 1.015 1.010 - 1.025   Blood, UA negative negative   pH, UA 7.0 5.0 - 8.0   Protein Ur, POC trace (A) negative mg/dL   Urobilinogen, UA 2.0 (A) 0.2 or 1.0 E.U./dL   Nitrite, UA Negative Negative   Leukocytes, UA Negative Negative  POCT Microscopic Urinalysis ([facility])  Result Value Ref Range   WBC,UR,HPF,POC None None WBC/hpf   RBC,UR,HPF,POC None None RBC/hpf   Bacteria None None, Too numerous to count   Mucus Absent Absent   Epithelial Cells, UR Per Microscopy Few (A) None, Too numerous to count cells/hpf  POCT glucose (manual entry)  Result Value Ref Range   POC Glucose 89 70 - 99 mg/dl  POCT CBC  Result Value Ref Range   WBC 7.1 4.6 - 10.2 K/uL   Lymph, poc 3.0 0.6 - 3.4   POC LYMPH PERCENT 42.1 10 - 50 %L   MID (cbc) 0.5 0 - 0.9   POC MID % 7.2 0 - 12 %M   POC Granulocyte 3.6 2 - 6.9   Granulocyte percent 50.7 37 - 80 %G   RBC 4.33 4.04 - 5.48 M/uL   Hemoglobin 13.2 12.2 - 16.2 g/dL  HCT, POC 40.0 37.7 - 47.9 %   MCV 92.4 80 - 97 fL   MCH, POC 30.6 27 - 31.2 pg   MCHC 33.1 31.8 - 35.4 g/dL   RDW, POC 13.4 %   Platelet Count, POC 210 142 - 424 K/uL   MPV 7.7 0 - 99.8 fL  POCT glycosylated hemoglobin (Hb A1C)  Result Value Ref Range   Hemoglobin A1C 5.9    Assessment & Plan:  Lyrica can cause impaired coordination, abnormal thinking, dizziness, shaking and  pain.  1. Tremulousness   2. Involuntary muscle contractions   3. Shakiness   4. Slurred speech   5. Lightheaded   6. Type 2 diabetes mellitus with hypoglycemia without coma, without long-term current use of insulin ([place])   7. Chronic post-traumatic headache, not intractable   8. Other symptoms and signs involving the nervous system   9. Family history of Parkinson's disease   10. Orthostatic hypotension   11. Moderate malnutrition ([place])     Orders Placed This Encounter  Procedures  . MR Brain W Wo Contrast    Patient can leave after procedure per provider    Standing Status:   Future    Number of Occurrences:   1    Standing Expiration Date:   05/05/2018    Order Specific Question:   If indicated for the ordered procedure, I authorize the administration of contrast media per Radiology protocol    Answer:   Yes    Order Specific Question:   What is the patient's sedation requirement?    Answer:   No Sedation    Order Specific Question:   Does the patient have a pacemaker or implanted devices?    Answer:   No    Order Specific Question:   Radiology Contrast Protocol - do NOT remove file path    Answer:   [email or web address]    Order Specific Question:   Reason for Exam additional comments    Answer:   [phone]    Order Specific Question:   Preferred imaging location?    Answer:   [facility], [facility] (table limit 350lbs)  . Comprehensive metabolic panel  . TSH  . C-reactive protein  . CK  . Magnesium  . Phosphorus  . Phosphorus  . Magnesium  . Ambulatory referral to Neurology    Referral Priority:   Urgent    Referral Type:   Consultation    Referral Reason:   Specialty Services Required    Requested Specialty:   Neurology    Number of Visits Requested:   1  . Orthostatic vital signs  . POCT urinalysis dipstick  . POCT Microscopic Urinalysis ([facility])  . POCT glucose (manual entry)  . POCT CBC  . POCT glycosylated hemoglobin (Hb A1C)  .  POCT SEDIMENTATION RATE    No orders of the defined types were placed in this encounter.   I personally performed the services described in this documentation, which was scribed in my presence. The recorded information has been reviewed and considered, and addended by me as needed.   Over 40 min spent in face-to-face evaluation of and consultation with patient and coordination of care.  Over 50% of this time was spent counseling this patient regarding potential etiology of sxs - pt with severe concerns of parkinson's, brain aneurysm, stroke, etc that do not fit sxs etiology.  Also reviewed that no sign she had DM neuropathy and no findings on NCV/EMG to explain her peripheral neuropathy - discussed  that it could be from depression and poss of a psychosomatic component which pt is not quite yet ready to accept but was more receptive than I had anticipated.   [name], M.D.  Primary Care at [facility] [address] [place], [place] [address] [phone] phone [phone] fax  03/07/17 3:09 PM

## 2017-03-06 NOTE — Patient Instructions (Addendum)
Your MRI has been scheduled for 9pm tonight at [facility]. It is in the same building as [facility]. You can also follow the signs for MRI once you reach the hospital. The street address is:  [place], [place] [address]  IF you received an x-ray today, you will receive an invoice from [facility]. Please contact [facility] at [phone] with questions or concerns regarding your invoice.   IF you received labwork today, you will receive an invoice from [place]. Please contact [facility] at [phone] with questions or concerns regarding your invoice.   Our billing staff will not be able to assist you with questions regarding bills from these companies.  You will be contacted with the lab results as soon as they are available. The fastest way to get your results is to activate your My Chart account. Instructions are located on the last page of this paperwork. If you have not heard from [place] regarding the results in 2 weeks, please contact this office.     Orthostatic Hypotension Orthostatic hypotension is a sudden drop in blood pressure that happens when you quickly change positions, such as when you get up from a seated or lying position. Blood pressure is a measurement of how strongly, or weakly, your blood is pressing against the walls of your arteries. Arteries are blood vessels that carry blood from your heart throughout your body. When blood pressure is too low, you may not get enough blood to your brain or to the rest of your organs. This can cause weakness, light-headedness, rapid heartbeat, and fainting. This can last for just a few seconds or for up to a few minutes. Orthostatic hypotension is usually not a serious problem. However, if it happens frequently or gets worse, it may be a sign of something more serious. What are the causes? This condition may be caused by:  Sudden changes in posture, such as standing up quickly after you have  been sitting or lying down.  Blood loss.  Loss of body fluids (dehydration).  Heart problems.  Hormone (endocrine) problems.  Pregnancy.  Severe infection.  Lack of certain nutrients.  Severe allergic reactions (anaphylaxis).  Certain medicines, such as blood pressure medicine or medicines that make the body lose excess fluids (diuretics). Sometimes, this condition can be caused by not taking medicine as directed, such as taking too much of a certain medicine.  What increases the risk? Certain factors can make you more likely to develop orthostatic hypotension, including:  Age. Risk increases as you get older.  Conditions that affect the heart or the central nervous system.  Taking certain medicines, such as blood pressure medicine or diuretics.  Being pregnant.  What are the signs or symptoms? Symptoms of this condition may include:  Weakness.  Light-headedness.  Dizziness.  Blurred vision.  Fatigue.  Rapid heartbeat.  Fainting, in severe cases.  How is this diagnosed? This condition is diagnosed based on:  Your medical history.  Your symptoms.  Your blood pressure measurement. Your health care provider will check your blood pressure when you are: ? Lying down. ? Sitting. ? Standing.  A blood pressure reading is recorded as two numbers, such as "120 over 80" (or 120/80). The first ("top") number is called the systolic pressure. It is a measure of the pressure in your arteries as your heart beats. The second ("bottom") number is called the diastolic pressure. It is a measure of the pressure in your arteries when your heart relaxes between  beats. Blood pressure is measured in a unit called mm Hg. Healthy blood pressure for adults is 120/80. If your blood pressure is below 90/60, you may be diagnosed with hypotension. Other information or tests that may be used to diagnose orthostatic hypotension include:  Your other vital signs, such as your heart rate  and temperature.  Blood tests.  Tilt table test. For this test, you will be safely secured to a table that moves you from a lying position to an upright position. Your heart rhythm and blood pressure will be monitored during the test.  How is this treated? Treatment for this condition may include:  Changing your diet. This may involve eating more salt (sodium) or drinking more water.  Taking medicines to raise your blood pressure.  Changing the dosage of certain medicines you are taking that might be lowering your blood pressure.  Wearing compression stockings. These stockings help to prevent blood clots and reduce swelling in your legs.  In some cases, you may need to go to the hospital for:  Fluid replacement. This means you will receive fluids through an IV tube.  Blood replacement. This means you will receive donated blood through an IV tube (transfusion).  Treating an infection or heart problems, if this applies.  Monitoring. You may need to be monitored while medicines that you are taking wear off.  Follow these instructions at home: Eating and drinking   Drink enough fluid to keep your urine clear or pale yellow.  Eat a healthy diet and follow instructions from your health care provider about eating or drinking restrictions. A healthy diet includes: ? Fresh fruits and vegetables. ? Whole grains. ? Lean meats. ? Low-fat dairy products.  Eat extra salt only as directed. Do not add extra salt to your diet unless your health care provider told you to do that.  Eat frequent, small meals.  Avoid standing up suddenly after eating. Medicines  Take over-the-counter and prescription medicines only as told by your health care provider. ? Follow instructions from your health care provider about changing the dosage of your current medicines, if this applies. ? Do not stop or adjust any of your medicines on your own. General instructions  Wear compression stockings as told  by your health care provider.  Get up slowly from lying down or sitting positions. This gives your blood pressure a chance to adjust.  Avoid hot showers and excessive heat as directed by your health care provider.  Return to your normal activities as told by your health care provider. Ask your health care provider what activities are safe for you.  Do not use any products that contain nicotine or tobacco, such as cigarettes and e-cigarettes. If you need help quitting, ask your health care provider.  Keep all follow-up visits as told by your health care provider. This is important. Contact a health care provider if:  You vomit.  You have diarrhea.  You have a fever for more than 2-3 days.  You feel more thirsty than usual.  You feel weak and tired. Get help right away if:  You have chest pain.  You have a fast or irregular heartbeat.  You develop numbness in any part of your body.  You cannot move your arms or your legs.  You have trouble speaking.  You become sweaty or feel lightheaded.  You faint.  You feel short of breath.  You have trouble staying awake.  You feel confused. This information is not intended to replace advice  given to you by your health care provider. Make sure you discuss any questions you have with your health care provider. Document Released: 12/21/2001 Document Revised: 09/19/2015 Document Reviewed: 06/23/2015 Elsevier Interactive Patient Education  2018 [employer].

## 2017-03-07 ENCOUNTER — Telehealth: Payer: Self-pay | Admitting: Family Medicine

## 2017-03-07 LAB — POCT I-STAT CREATININE: Creatinine, Ser: 0.8 mg/dL (ref 0.44–1.00)

## 2017-03-07 LAB — COMPREHENSIVE METABOLIC PANEL
ALBUMIN: 4.4 g/dL (ref 3.6–4.8)
ALT: 33 IU/L — ABNORMAL HIGH (ref 0–32)
AST: 33 IU/L (ref 0–40)
Albumin/Globulin Ratio: 2 (ref 1.2–2.2)
Alkaline Phosphatase: 115 IU/L (ref 39–117)
BUN / CREAT RATIO: 28 (ref 12–28)
BUN: 24 mg/dL (ref 8–27)
Bilirubin Total: 0.3 mg/dL (ref 0.0–1.2)
CALCIUM: 9.5 mg/dL (ref 8.7–10.3)
CHLORIDE: 100 mmol/L (ref 96–106)
CO2: 29 mmol/L (ref 20–29)
Creatinine, Ser: 0.86 mg/dL (ref 0.57–1.00)
GFR, EST AFRICAN AMERICAN: 80 mL/min/{1.73_m2} (ref >59)
GFR, EST NON AFRICAN AMERICAN: 69 mL/min/{1.73_m2} (ref >59)
GLOBULIN, TOTAL: 2.2 g/dL (ref 1.5–4.5)
Glucose: 84 mg/dL (ref 65–99)
POTASSIUM: 4.4 mmol/L (ref 3.5–5.2)
Sodium: 143 mmol/L (ref 134–144)
TOTAL PROTEIN: 6.6 g/dL (ref 6.0–8.5)

## 2017-03-07 LAB — C-REACTIVE PROTEIN: CRP: <0.3 mg/L (ref 0.0–4.9)

## 2017-03-07 LAB — MAGNESIUM: Magnesium: 2.6 mg/dL — ABNORMAL HIGH (ref 1.6–2.3)

## 2017-03-07 LAB — TSH: TSH: 1.78 u[IU]/mL (ref 0.450–4.500)

## 2017-03-07 LAB — PHOSPHORUS: Phosphorus: 3.8 mg/dL (ref 2.5–4.5)

## 2017-03-07 LAB — CK: CK TOTAL: 197 U/L — AB (ref 24–173)

## 2017-03-07 NOTE — Addendum Note (Signed)
Addended by: Shawnee Knapp on: 03/07/2017 03:56 PM   Modules accepted: Orders

## 2017-03-07 NOTE — Telephone Encounter (Signed)
Copied from [place] [id number]. Topic: Quick Communication - See Telephone Encounter >> Mar 07, 2017  8:40 AM Bea Graff, NT wrote: CRM for notification. See Telephone encounter for: Pt would like a call with her MRI results. She is suppose to be leaving to go to [facility] tomorrow and wants to know if it is safe for her to go.   03/07/17.

## 2017-03-10 ENCOUNTER — Telehealth: Payer: Self-pay | Admitting: Family Medicine

## 2017-03-10 NOTE — Telephone Encounter (Signed)
Error in previous message. Appointment is 04/15/17 with Dr. Tomi Likens

## 2017-03-10 NOTE — Telephone Encounter (Signed)
Results were sent to pt on MyChart on 2/22 afternoon.

## 2017-03-10 NOTE — Telephone Encounter (Signed)
That should be fine. THanks.

## 2017-03-10 NOTE — Telephone Encounter (Signed)
Pt has urgent referral in for Neurology and it was routed to [facility] by provider on 2/21. They have scheduled the pt for 04/14/17. Is this appt okay or would you like [place] to try a different office?

## 2017-03-13 ENCOUNTER — Encounter: Payer: Self-pay | Admitting: Family Medicine

## 2017-03-13 ENCOUNTER — Other Ambulatory Visit: Payer: Self-pay

## 2017-03-13 ENCOUNTER — Ambulatory Visit (INDEPENDENT_AMBULATORY_CARE_PROVIDER_SITE_OTHER): Payer: Medicare Other | Admitting: Family Medicine

## 2017-03-13 VITALS — BP 96/58 | HR 93 | Temp 98.6°F | Resp 18 | Ht 65.0 in | Wt 115.2 lb

## 2017-03-13 DIAGNOSIS — R600 Localized edema: Secondary | ICD-10-CM | POA: Diagnosis not present

## 2017-03-13 DIAGNOSIS — R748 Abnormal levels of other serum enzymes: Secondary | ICD-10-CM | POA: Diagnosis not present

## 2017-03-13 DIAGNOSIS — I951 Orthostatic hypotension: Secondary | ICD-10-CM | POA: Diagnosis not present

## 2017-03-13 DIAGNOSIS — E161 Other hypoglycemia: Secondary | ICD-10-CM | POA: Diagnosis not present

## 2017-03-13 DIAGNOSIS — E11649 Type 2 diabetes mellitus with hypoglycemia without coma: Secondary | ICD-10-CM

## 2017-03-13 DIAGNOSIS — R7989 Other specified abnormal findings of blood chemistry: Secondary | ICD-10-CM | POA: Diagnosis not present

## 2017-03-13 DIAGNOSIS — E559 Vitamin D deficiency, unspecified: Secondary | ICD-10-CM

## 2017-03-13 DIAGNOSIS — M6240 Contracture of muscle, unspecified site: Secondary | ICD-10-CM | POA: Diagnosis not present

## 2017-03-13 DIAGNOSIS — R251 Tremor, unspecified: Secondary | ICD-10-CM | POA: Diagnosis not present

## 2017-03-13 DIAGNOSIS — R42 Dizziness and giddiness: Secondary | ICD-10-CM | POA: Diagnosis not present

## 2017-03-13 DIAGNOSIS — E440 Moderate protein-calorie malnutrition: Secondary | ICD-10-CM | POA: Diagnosis not present

## 2017-03-13 NOTE — Addendum Note (Signed)
Addended by: Gari Crown D on: 03/13/2017 09:10 AM   Modules accepted: Orders

## 2017-03-13 NOTE — Patient Instructions (Addendum)
Decrease your lyrica by 1 tab every 3 days until you are off of it. Lets leave you off for several weeks to see what your baseline is.    Lets have you stop the lasix for now. I am worried it is counteracting the high salt diet you are doing to increase your blood pressure.   High protein diet and plenty of water is equally as important or more so than the salt. If you continue to have low blood pressure or abnormal salts in the blood, lets have you see your endocrinologist for further eval.  Look at the [facility] ingredient list but for now I think that would be fine to continue.   Recheck in 1 week to see how you are doing coming off lasix and review lab results, low blood pressure, edema.       IF you received an x-ray today, you will receive an invoice from [facility]. Please contact [facility] at [phone] with questions or concerns regarding your invoice.   IF you received labwork today, you will receive an invoice from [place]. Please contact [facility] at [phone] with questions or concerns regarding your invoice.   Our billing staff will not be able to assist you with questions regarding bills from these companies.  You will be contacted with the lab results as soon as they are available. The fastest way to get your results is to activate your My Chart account. Instructions are located on the last page of this paperwork. If you have not heard from [place] regarding the results in 2 weeks, please contact this office.     Addison Disease Addison disease, which is also called primary adrenal insufficiency, is a condition in which the adrenal glands do not make enough of the hormones cortisol and aldosterone. The disease causes blood pressure to drop and causes potassium to build up to dangerous levels. If Addison disease is not treated, it can suddenly get worse and become a life-threatening condition. A sudden worsening of the disease is called an addisonian  crisis. What are the causes? This condition may be caused by:  A disease in which the body's own immune system damages the adrenal glands.  An infection of the adrenal glands.  Bleeding (hemorrhage) in the adrenal glands.  A tumor.  What are the signs or symptoms? Common symptoms of this condition include:  Severe fatigue.  Muscle weakness.  Loss of appetite.  Weight loss.  Darkening of the skin.  Drops in blood pressure.  Other symptoms include:  Nausea or vomiting.  Diarrhea.  Dizziness or fainting.  Irritability  Depression.  Salt cravings.  Low blood sugar (hypoglycemia).  Irregular or no menstrual periods.  Symptoms usually develop slowly and get worse gradually. How is this diagnosed? This condition may be diagnosed based on your medical history, symptoms, and lab test results. The lab tests include a measurement of your blood cortisol levels. You may also have a CT scan or MRI of the adrenal and pituitary glands. How is this treated? This condition cannot be cured, but it can be managed with medicines that replace cortisol and aldosterone. These medicines may need to be taken several times per day. If you become so sick that you are unable to take these medicines by mouth or you are unable to keep them down, you will need to get them through an injection. Illness, stress, and surgery can increase your body's need for cortisol. It is very important that you talk with your health care  provider and understand how to adjust your medicine dosages if you become ill or stressed or if you are to have surgery. Follow these instructions at home:  Take over-the-counter and prescription medicines only as told by your health care provider.  Know how to increase your medicine dosage during periods of stress or mild illness.  When you travel, carry a needle, a syringe, and an injectable form of cortisol in case of an emergency.  In case of an emergency, wear a  warning bracelet or neck chain so that others understand that you have Addison disease.  Carry an ID card that states that you have Addison disease. The card should include: ? Instructions to inject a certain amount of medicine if you are severely hurt or cannot respond. ? The name and phone number of your health care provider. ? The name and phone number of your closest relative. Contact a health care provider if:  You get sick with another illness.  You develop new symptoms. Get help right away if:  You have a severe infection or other illness.  You have severe vomiting or diarrhea.  You find it necessary to give yourself injectable medicine.  You have symptoms of an addisonian crisis. These include: ? Sudden, severe pain in the lower back, abdomen, or legs. ? Severe vomiting and diarrhea. ? Dehydration. ? Low blood pressure. ? Loss of consciousness. This information is not intended to replace advice given to you by your health care provider. Make sure you discuss any questions you have with your health care provider. Document Released: 12/31/2004 Document Revised: 07/21/2015 Document Reviewed: 02/24/2014 Elsevier Interactive Patient Education  2018 [employer].  Hypotension Hypotension, commonly called low blood pressure, is when the force of blood pumping through your arteries is too weak. Arteries are blood vessels that carry blood from the heart throughout the body. When blood pressure is too low, you may not get enough blood to your brain or to the rest of your organs. This can cause weakness, light-headedness, rapid heartbeat, and fainting. Depending on the cause and severity, hypotension may be harmless (benign) or cause serious problems (critical). What are the causes? Possible causes of hypotension include:  Blood loss.  Loss of body fluids (dehydration).  Heart problems.  Hormone (endocrine) problems.  Pregnancy.  Severe infection.  Lack of certain  nutrients.  Severe allergic reactions (anaphylaxis).  Certain medicines, such as blood pressure medicine or medicines that make the body lose excess fluids (diuretics). Sometimes, hypotension can be caused by not taking medicine as directed, such as taking too much of a certain medicine.  What increases the risk? Certain factors can make you more likely to develop hypotension, including:  Age. Risk increases as you get older.  Conditions that affect the heart or the central nervous system.  Taking certain medicines, such as blood pressure medicine or diuretics.  Being pregnant.  What are the signs or symptoms? Symptoms of this condition may include:  Weakness.  Light-headedness.  Dizziness.  Blurred vision.  Fatigue.  Rapid heartbeat.  Fainting, in severe cases.  How is this diagnosed? This condition is diagnosed based on:  Your medical history.  Your symptoms.  Your blood pressure measurement. Your health care provider will check your blood pressure when you are: ? Lying down. ? Sitting. ? Standing.  A blood pressure reading is recorded as two numbers, such as "120 over 80" (or 120/80). The first ("top") number is called the systolic pressure. It is a measure of the pressure  in your arteries as your heart beats. The second ("bottom") number is called the diastolic pressure. It is a measure of the pressure in your arteries when your heart relaxes between beats. Blood pressure is measured in a unit called mm Hg. Healthy blood pressure for adults is 120/80. If your blood pressure is below 90/60, you may be diagnosed with hypotension. Other information or tests that may be used to diagnose hypotension include:  Your other vital signs, such as your heart rate and temperature.  Blood tests.  Tilt table test. For this test, you will be safely secured to a table that moves you from a lying position to an upright position. Your heart rhythm and blood pressure will be  monitored during the test.  How is this treated? Treatment for this condition may include:  Changing your diet. This may involve eating more salt (sodium) or drinking more water.  Taking medicines to raise your blood pressure.  Changing the dosage of certain medicines you are taking that might be lowering your blood pressure.  Wearing compression stockings. These stockings help to prevent blood clots and reduce swelling in your legs.  In some cases, you may need to go to the hospital for:  Fluid replacement. This means you will receive fluids through an IV tube.  Blood replacement. This means you will receive donated blood through an IV tube (transfusion).  Treating an infection or heart problems, if this applies.  Monitoring. You may need to be monitored while medicines that you are taking wear off.  Follow these instructions at home: Eating and drinking   Drink enough fluid to keep your urine clear or pale yellow.  Eat a healthy diet and follow instructions from your health care provider about eating or drinking restrictions. A healthy diet includes: ? Fresh fruits and vegetables. ? Whole grains. ? Lean meats. ? Low-fat dairy products.  Eat extra salt only as directed. Do not add extra salt to your diet unless your health care provider told you to do that.  Eat frequent, small meals.  Avoid standing up suddenly after eating. Medicines  Take over-the-counter and prescription medicines only as told by your health care provider. ? Follow instructions from your health care provider about changing the dosage of your current medicines, if this applies. ? Do not stop or adjust any of your medicines on your own. General instructions  Wear compression stockings as told by your health care provider.  Get up slowly from lying down or sitting positions. This gives your blood pressure a chance to adjust.  Avoid hot showers and excessive heat as directed by your health care  provider.  Return to your normal activities as told by your health care provider. Ask your health care provider what activities are safe for you.  Do not use any products that contain nicotine or tobacco, such as cigarettes and e-cigarettes. If you need help quitting, ask your health care provider.  Keep all follow-up visits as told by your health care provider. This is important. Contact a health care provider if:  You vomit.  You have diarrhea.  You have a fever for more than 2-3 days.  You feel more thirsty than usual.  You feel weak and tired. Get help right away if:  You have chest pain.  You have a fast or irregular heartbeat.  You develop numbness in any part of your body.  You cannot move your arms or your legs.  You have trouble speaking.  You become sweaty  or feel light-headed.  You faint.  You feel short of breath.  You have trouble staying awake.  You feel confused. This information is not intended to replace advice given to you by your health care provider. Make sure you discuss any questions you have with your health care provider. Document Released: 12/31/2004 Document Revised: 07/21/2015 Document Reviewed: 06/22/2015 Elsevier Interactive Patient Education  2018 [employer].

## 2017-03-13 NOTE — Progress Notes (Signed)
Subjective:  By signing my name below, I, Kayla [name], attest that this documentation has been prepared under the direction and in the presence of Kayla Cheadle, MD Electronically Signed: [name], ED Scribe 03/13/2017 at 9:21 AM.   Patient ID: Kayla [name], female    DOB: 1945-08-07, 72 y.o.   MRN: [id number]  Chief Complaint  Patient presents with  . Tremors    Pt states tremors are better with the decreased Lyrica. Pt states she still has to move her hands back from the keyboard b/c hands/fingers hit random keys. Pt states she still has constant tremors.  . Confusion    Pt states confusion is not has bad with the decreased Lyrica. Pt states is able to make decisions and trusted herself to drive to her appt today.  . Weakness    Pt states weakness is not as bad with the decreased Lyrica but states she is dropping pots and pans.  . Follow-up   HPI Kayla [name] is a 72 y.o. female who presents to Primary Care at [facility] for f/u on tremulousness. H/o PTH due to low Vit D followed by Dr. Rosann Auerbach which resolved 6 months ago after replacement of D.  Pt has noticed improvement in tremors and confusion with decreasing to Lyrica 50 tid. She is still taking folic acid suggested by Dr. Rochele Pages for foot pain but states she has stopped everything else. She typically wears compression socks and support hose that tighten the more that she washes them.  Past Medical History:  Diagnosis Date  . Allergy   . Anemia    anemia in the past  . Angina    takes Propanolol prn and it stops her chest  . Anxiety   . Barrett esophagus    not consistently present  . Cataract   . Chronic kidney disease    kidney stones and UTI  . Complication of anesthesia    either  BP or pulse dropped with last surgery  . DDD (degenerative disc disease)    cervical and lumbar  . Dementia   . Depression    post traumatic stress  disorder  . Diabetes mellitus    sugar goes up and down diet controlled  .  Dysrhythmia    brought on by stress  . External hemorrhoids   . Fatigue   . Fibromyalgia    neuropathy knees ankles and toes, bladder  . GERD (gastroesophageal reflux disease)   . H/O hyperparathyroidism   . Headache(784.0)    migraines  . Hiatal hernia   . History of kidney stones   . Hypertension    3 small brain aneurysms  . IBS (irritable bowel syndrome)   . Internal hemorrhoids   . Macular degeneration   . Migraines   . Osteoarthritis    all over  . Osteopetrosis   . Peripheral vascular disease (HCC)    superficial phlebitis in left calf  . Personal history of colonic polyps 07/22/2013   07/2013 - 3 small adenomas - repeat colonoscopy 2018   Current Outpatient Medications on File Prior to Visit  Medication Sig Dispense Refill  . acarbose (PRECOSE) 25 MG tablet Take 1 tablet (25 mg total) by mouth 3 (three) times daily with meals. 90 tablet 5  . Ascorbic Acid (VITAMIN C) 1000 MG tablet Take 1,000 mg by mouth daily.    Marland Kitchen aspirin-acetaminophen-caffeine (EXCEDRIN MIGRAINE) 250-250-65 MG tablet Take 2 tablets by mouth 2 (two) times daily.    . beta carotene  w/minerals (OCUVITE) tablet Take 1 tablet by mouth 2 (two) times daily.    Marland Kitchen CALCIUM-MAGNESIUM-ZINC PO Take by mouth daily.    . Cholecalciferol (VITAMIN D3) 2000 UNITS TABS Take 2,000 Units by mouth daily.    . clonazePAM (KLONOPIN) 1 MG tablet Take 0.5-1 tablets (0.5-1 mg total) by mouth 2 (two) times daily as needed for anxiety. 180 tablet 1  . Continuous Blood Gluc Receiver (FREESTYLE LIBRE 14 DAY READER) DEVI 1 Device every 14 (fourteen) days by Does not apply route. 6 Device 3  . Cyanocobalamin (VITAMIN B-12) 1000 MCG SUBL Place 5,000 mcg under the tongue daily.     . cyclobenzaprine (FLEXERIL) 10 MG tablet Take 1 tablet (10 mg total) by mouth 3 (three) times daily as needed for muscle spasms. 270 tablet 1  . cycloSPORINE (RESTASIS) 0.05 % ophthalmic emulsion Place 1 drop into both eyes 2 (two) times daily. 5.5 mL 5  .  diclofenac sodium (VOLTAREN) 1 % GEL Apply 2 g topically 4 (four) times daily. 700 g 3  . dicyclomine (BENTYL) 10 MG capsule Take 1-2 tab 3 times daily AC as needed for spasms and cramping. 270 capsule 1  . diltiazem 2 % GEL Apply 1 application topically 2 (two) times daily as needed (pain from fissure). 30 g 2  . docusate sodium (COLACE) 250 MG capsule Take 1 capsule (250 mg total) by mouth daily. 90 capsule 3  . esomeprazole (NEXIUM) 40 MG capsule Take 1 capsule (40 mg total) by mouth daily before breakfast. 90 capsule 3  . fentaNYL (DURAGESIC - DOSED MCG/HR) 50 MCG/HR Place 50 mcg onto the skin every 3 (three) days.    . furosemide (LASIX) 40 MG tablet Take 1 tablet (40 mg total) by mouth daily as needed for edema. 90 tablet 0  . glucose blood test strip Use to check cbgs 6 times daily as directed by physician. Testing freq: hypoglycemia, hyperglycemia. ICD10: R73.03, E 16.2 600 each 5  . HYDROcodone-acetaminophen (NORCO) 7.5-325 MG tablet Take 1 tablet every 6 (six) hours as needed by mouth for moderate pain.    . hypromellose (SYSTANE OVERNIGHT THERAPY) 0.3 % GEL ophthalmic ointment Place 1 drop into both eyes at bedtime. Reported on 02/11/2015    . IRON PO Take by mouth.    Marland Kitchen L-Methylfolate-Algae-B12-B6 (METANX) 3-90.314-2-35 MG CAPS Take 1 capsule 2 (two) times daily by mouth.    . lidocaine (LIDODERM) 5 % Place 1 patch onto the skin as needed. Remove & Discard patch within 12 hours. May dispense as 3 month supply 90 patch 3  . Lifitegrast (XIIDRA) 5 % SOLN Place 1 drop into both eyes daily.     . meloxicam (MOBIC) 15 MG tablet TAKE 1 TABLET BY MOUTH EVERY DAY 30 tablet 0  . mirabegron ER (MYRBETRIQ) 25 MG TB24 tablet Take 1 tablet (25 mg total) by mouth daily. 90 tablet 3  . mupirocin ointment (BACTROBAN) 2 % Apply 1 application topically 3 (three) times daily. 30 g 1  . Naloxone HCl 0.4 MG/0.4ML SOAJ Administer 0.4mg  [place] at first sign of opioid overdose and repeat every 2 minutes as needed  for resuscitation. Call 911 immediately 5 Package 0  . NON FORMULARY [facility]  Peripheral Neuropathy Cream- Bupivacaine 1%, Doxepin 3%, Gabapentin 6%, Pentoxifylline 3%, Topiramate 1% Apply 1-2 grams to affected area 3-4 times daily Qty. 120 gm 3 refills    . Omega-3 Fatty Acids (SEA-OMEGA PO) Take 1,000 mg by mouth daily.    . ondansetron (ZOFRAN)  8 MG tablet Take 1 tablet (8 mg total) by mouth every 8 (eight) hours as needed for nausea or vomiting. 60 tablet 1  . ONE TOUCH LANCETS MISC Use to check cbgs 6 times daily as directed by physician. Testing freq: hypoglycemia, hyperglycemia. ICD10: R73.03, E 16.2 600 each 5  . potassium chloride SA (K-DUR,KLOR-CON) 20 MEQ tablet Take 1 tablet (20 mEq total) by mouth daily as needed (along with furosemide whenever it is used for edema). 90 tablet 0  . pregabalin (LYRICA) 50 MG capsule Take 1 capsule (50 mg total) by mouth 3 (three) times daily. 90 capsule 3  . Probiotic Product (PROBIOTIC-10 PO) Take by mouth.    . pyridoxine (B-6) 200 MG tablet Take 200 mg by mouth daily.    . SUMAtriptan (IMITREX) 100 MG tablet Take 1 tablet at earliest onset of headache, may repeat in 2 hours if headache persists or reoccurs. (Patient taking differently: Take 100 mg by mouth every 3 (three) days. ) 30 tablet 1  . thiamine (VITAMIN B-1) 100 MG tablet Take 100 mg daily by mouth.    . traZODone (DESYREL) 100 MG tablet Take 0.5-1 tablets (50-100 mg total) by mouth at bedtime as needed for sleep. And 1/2 -1 if wakes at 4am. 90 tablet 1  . Vitamin D, Ergocalciferol, (DRISDOL) 50000 units CAPS capsule Take 1 capsule (50,000 Units total) by mouth every 7 (seven) days. 12 capsule 0  . [DISCONTINUED] buPROPion (WELLBUTRIN) 75 MG tablet Take 75 mg by mouth daily after breakfast.      No current facility-administered medications on file prior to visit.    Allergies  Allergen Reactions  . Ambien [Zolpidem Tartrate]     Hallucinations  . Codeine Anaphylaxis  .  Oxycodone-Acetaminophen Shortness Of Breath  . Tylox [Oxycodone-Acetaminophen] Anaphylaxis    Chest pain  . Darvocet [Propoxyphene N-Acetaminophen]     Chest pain  . Darvon [Propoxyphene] Itching  . Food Swelling    wheat-  . Lorcet [Hydrocodone-Acetaminophen]     Chest pain  . Opium     hallucinations  . Vicodin [Hydrocodone-Acetaminophen] Other (See Comments)    Chest Pain   . Wheat Bran   . Amoxicillin Nausea And Vomiting    Reaction:  Migraine headache  . Penicillins Nausea And Vomiting    Migraine Headaches   Past Surgical History:  Procedure Laterality Date  . ABDOMINAL HYSTERECTOMY    . APPENDECTOMY    . CARDIAC CATHETERIZATION  03/22/1991   EF 61%  . CARDIOVASCULAR STRESS TEST  10/03/2006  . CHOLECYSTECTOMY    . COLONOSCOPY  06/23/2008   normal  . DILATION AND CURETTAGE OF UTERUS     x2  . ESOPHAGUS SURGERY    . EXPLORATORY LAPAROTOMY  1993  . GASTRIC BYPASS  1999  . GASTRIC RESTRICTION SURGERY  1991  . Right Arm Surgery    . Right Knee Arthroscopy    . Rt wrist fx  2009  . stomach stappeling  1991  . STONE EXTRACTION WITH BASKET  2012  . TONSILLECTOMY    . TUBAL LIGATION    . UPPER GASTROINTESTINAL ENDOSCOPY  06/23/2008   w/Dil, Barrett's esophagus  . US ECHOCARDIOGRAPHY  01/21/2007   EF 55-60%  . US ECHOCARDIOGRAPHY  08/30/2003   EF 55-60%   Family History  Problem Relation Age of Onset  . Stroke Mother   . Lung cancer Mother   . Heart disease Mother   . Diabetes Mother   . Hypertension Father   .  Stroke Father   . Lung cancer Father   . Heart disease Father   . Kidney disease Father   . Seizures Father        epilepsy, and sisters x 2  . Pancreatic cancer Sister   . Lung cancer Sister   . Colon cancer Maternal Aunt        12 relatives  . Uterine cancer Unknown        aunt  . Heart disease Unknown        grandmother  . Clotting disorder Sister   . Heart disease Sister        x 2  . Kidney disease Sister        x 2  . Breast cancer Sister     . Colon cancer Paternal Aunt   . Colon cancer Paternal Aunt    Social History   Socioeconomic History  . Marital status: Married    Spouse name: Rushie Chestnut.  . Number of children: 2  . Years of education: None  . Highest education level: None  Social Needs  . Financial resource strain: None  . Food insecurity - worry: None  . Food insecurity - inability: None  . Transportation needs - medical: None  . Transportation needs - non-medical: None  Occupational History  . Occupation: Disability    Employer: RETIRED  Tobacco Use  . Smoking status: Never Smoker  . Smokeless tobacco: Never Used  Substance and Sexual Activity  . Alcohol use: No  . Drug use: No  . Sexual activity: Not Currently  Other Topics Concern  . None  Social History Narrative   She retired on disability   Married   2 Children   Depression screen [facility] 2/9 03/13/2017 03/06/2017 01/23/2017 11/29/2016 10/24/2016  Decreased Interest 0 0 0 0 0  Down, Depressed, Hopeless 0 0 1 0 0  PHQ - 2 Score 0 0 1 0 0  Altered sleeping - - - - -  Tired, decreased energy - - - - -  Change in appetite - - - - -  Feeling bad or failure about yourself  - - - - -  Trouble concentrating - - - - -  Moving slowly or fidgety/restless - - - - -  Suicidal thoughts - - - - -  PHQ-9 Score - - - - -  Difficult doing work/chores - - - - -  Some recent data might be hidden    Review of Systems  Neurological: Positive for tremors (improved).  Psychiatric/Behavioral: Positive for confusion (improved).      Objective:   Physical Exam  Constitutional: She is oriented to person, place, and time. She appears well-developed and well-nourished. No distress.  HENT:  Head: Normocephalic and atraumatic.  Eyes: Conjunctivae and EOM are normal.  Neck: Neck supple. No tracheal deviation present.  Cardiovascular: Normal rate.  Pulmonary/Chest: Effort normal. No respiratory distress.  Musculoskeletal: Normal range of motion.  Neurological: She  is alert and oriented to person, place, and time.  Skin: Skin is warm and dry.  Psychiatric: She has a normal mood and affect. Her behavior is normal.  Nursing note and vitals reviewed.  BP (!) 96/58 (BP Location: Left Arm, Patient Position: Sitting, Cuff Size: Normal)   Pulse 93   Temp 98.6 F (37 C) (Oral)   Resp 18   Ht 5\' 5"  (1.651 m)   Wt 115 lb 3.2 oz (52.3 kg)   SpO2 97%   BMI 19.17 kg/m  Diabetic Foot Exam - Simple   Simple Foot Form Diabetic Foot exam was performed with the following findings:  Yes 03/13/2017  8:55 AM  Visual Inspection No deformities, no ulcerations, no other skin breakdown bilaterally:  Yes Sensation Testing Intact to touch and monofilament testing bilaterally:  Yes See comments:  Yes Pulse Check Posterior Tibialis and Dorsalis pulse intact bilaterally:  Yes Comments Pt states she had trouble with feeling sensation. Pt was unable to feel touch sensation on ball of both feet and had difficulty with touch sensation on the heels of both feet.    Assessment & Plan:   1. Type 2 diabetes mellitus with hypoglycemia without coma, without long-term current use of insulin ([place]) - seeing endocrine, consider asking them to eval pt for symptomatic hypotension w/ pedal edema and involuntary weight loss w/u as well though no electrolyte abnml as would almost always see if mineralocorticoid def was cause.  Working w/ nutrition closely.  2. Vitamin D deficiency   3. High serum magnesium - recheck now that she has been off her magnesium supp for sev d.  4. Elevated CK   5. Reactive hypoglycemia  6. Pedal edema - advised against diuretic as will just lower BP more and cause intravascular depletion without mobilizing the 3rd spaced edema as well so stop lasix as she is having to combat the hypotension with high salt diet which may actually be worsening edema - focus on compression, elevation, etc. Focus on increasing protein in diet.   Already seeing some sx  improvement since she has decreased lyrica dose back from 100mg  tid to 50mg  tid ~5d prior.  Now rec continuing to wean off by decreasing daily dose by 1 pill every 3d. Stay off for several weeks so she can get a good baseline picture of her neurologic functioning w/o med impairment - they could consider adding back in a low dose if she thought it was helpful for her lower ext neuropathy sxs (doesn't sound like it is though as she still describes excruciating pain)  Unsure what her lower ext "neuropathy" pain is though as NCV/EMG did not show any nerve damage that would explain her burning pain - she has been assuming it is DM neuropathy but I am doubtful.  I wonder if this could be a atypical complex regional sympathetic dystrophy syndrome?  Or very atypical fibromyalgia?  Encouraged pt to make f/u appt w/ neurology and pain management to discuss.  Could consider referral to PM&R for c/s.    Orders Placed This Encounter  Procedures  . Magnesium  . CK  . VITAMIN D 25 Hydroxy (Vit-D Deficiency, Fractures)  . PTH, Intact and Calcium  . Magnesium, urine  . Calcium, urine, random  . HM DIABETES FOOT EXAM    I personally performed the services described in this documentation, which was scribed in my presence. The recorded information has been reviewed and considered, and addended by me as needed.   Kayla [name], M.D.  Primary Care at [facility] [address] [place], [place] [address] [phone] phone [phone] fax  03/19/17 3:27 AM

## 2017-03-14 DIAGNOSIS — R7989 Other specified abnormal findings of blood chemistry: Secondary | ICD-10-CM | POA: Diagnosis not present

## 2017-03-14 LAB — VITAMIN D 25 HYDROXY (VIT D DEFICIENCY, FRACTURES): VIT D 25 HYDROXY: 21 ng/mL — AB (ref 30.0–100.0)

## 2017-03-14 LAB — PTH, INTACT AND CALCIUM
CALCIUM: 9.3 mg/dL (ref 8.7–10.3)
PTH: 49 pg/mL (ref 15–65)

## 2017-03-14 LAB — MAGNESIUM: Magnesium: 2.1 mg/dL (ref 1.6–2.3)

## 2017-03-14 LAB — CK: Total CK: 133 U/L (ref 24–173)

## 2017-03-15 LAB — MAGNESIUM, URINE: MAGNESIUM, U: 2.4 mg/dL

## 2017-03-15 LAB — CALCIUM, URINE, RANDOM: CALCIUM, URINE: 2.9 mg/dL

## 2017-03-18 ENCOUNTER — Ambulatory Visit: Payer: Self-pay | Admitting: Psychiatry

## 2017-03-18 ENCOUNTER — Ambulatory Visit: Payer: Medicare Other | Admitting: Psychiatry

## 2017-03-18 LAB — CORTISOL, SALIVARY
SALIVARY CORTISOL, MS: 0.317 ug/dL
SALIVARY CORTISOL, MS: 0.528 ug/dL

## 2017-03-19 ENCOUNTER — Ambulatory Visit (INDEPENDENT_AMBULATORY_CARE_PROVIDER_SITE_OTHER): Payer: Medicare Other | Admitting: Family Medicine

## 2017-03-19 ENCOUNTER — Other Ambulatory Visit: Payer: Self-pay

## 2017-03-19 ENCOUNTER — Encounter: Payer: Self-pay | Admitting: Family Medicine

## 2017-03-19 VITALS — BP 94/61 | HR 77 | Temp 98.3°F | Resp 16 | Ht 65.0 in | Wt 118.2 lb

## 2017-03-19 DIAGNOSIS — G894 Chronic pain syndrome: Secondary | ICD-10-CM

## 2017-03-19 DIAGNOSIS — B029 Zoster without complications: Secondary | ICD-10-CM | POA: Diagnosis not present

## 2017-03-19 DIAGNOSIS — R600 Localized edema: Secondary | ICD-10-CM

## 2017-03-19 DIAGNOSIS — G5793 Unspecified mononeuropathy of bilateral lower limbs: Secondary | ICD-10-CM

## 2017-03-19 DIAGNOSIS — L719 Rosacea, unspecified: Secondary | ICD-10-CM | POA: Diagnosis not present

## 2017-03-19 DIAGNOSIS — R5381 Other malaise: Secondary | ICD-10-CM

## 2017-03-19 DIAGNOSIS — R350 Frequency of micturition: Secondary | ICD-10-CM

## 2017-03-19 DIAGNOSIS — I950 Idiopathic hypotension: Secondary | ICD-10-CM

## 2017-03-19 LAB — POCT URINALYSIS DIP (MANUAL ENTRY)
Bilirubin, UA: NEGATIVE
GLUCOSE UA: NEGATIVE mg/dL
LEUKOCYTES UA: NEGATIVE
NITRITE UA: NEGATIVE
PH UA: 5.5 (ref 5.0–8.0)
Protein Ur, POC: NEGATIVE mg/dL
RBC UA: NEGATIVE
Spec Grav, UA: 1.025 (ref 1.010–1.025)
UROBILINOGEN UA: 0.2 U/dL

## 2017-03-19 LAB — POC MICROSCOPIC URINALYSIS (UMFC): MUCUS RE: ABSENT

## 2017-03-19 MED ORDER — VALACYCLOVIR HCL 1 G PO TABS: 1000.0000 mg | ORAL_TABLET | Freq: Three times a day (TID) | ORAL | 0 refills | Status: DC

## 2017-03-19 MED ORDER — METRONIDAZOLE 1 % EX GEL: Freq: Every day | CUTANEOUS | 0 refills | Status: DC

## 2017-03-19 NOTE — Progress Notes (Addendum)
Subjective:  By signing my name below, I, Kayla [name], attest that this documentation has been prepared under the direction and in the presence of [name], MD. Electronically Signed: Moises [name], Eva. 03/19/2017 , 3:24 PM .  Patient was seen in Room 2 .   Patient ID: Kayla [name], female    DOB: July 23, 1945, 72 y.o.   MRN: [id number] Chief Complaint  Patient presents with  . Follow-up    on labs from 03/06/17  . Back Pain    left of spine started today, "shooting pain in urethra"  . Medication Refill    Pt states she isn't sure which meds need refilled.   HPI Kayla [name] is a 72 y.o. female who presents to Primary Care at [facility] for follow up. Patient states she tried weaning off of her Lyrica, but her feet pain was more intense than it had been. She's currently taking 1 tablet at night, which somewhat dulls the pain. She describes her feet can turn to a darkish gray hue, with L>R, as well as feet feeling fiery hot or frostbitten cold. She's always elevating her feet when laying down. She notes legs holding a lot of fluid, feeling distended and swollen. She believes weight gain is from fluid and not necessarily from food. She mentions having regular bowels as long as she's on colon cleanse probiotics and no constipation while on it. She's stopped all vitamins except for iron supplement.   She's currently taking quarter tablet of trazodone at night, but would wake up every 2 hours. So over the last few nights, she's been taking half trazodone at night and would wake up every 4 hours.   She's been using fentanyl patch, as her neck and bones hurt really bad.   She also mentions an itching rash on left over left shoulder that started 3-4 days ago. She initially thought it was from her TENS unit.   She also complains of having left flank pain that radiates around anteriorly, and notes having some pain in her urethra. She mentions history of kidney stones.   Her BP has been  running fairly low. Patient last saw cardiologist, Dr. Acie Fredrickson, about 4-6 months ago. Patient states she was informed to eat more salt to bring her BP back up.  BP Readings from Last 3 Encounters:  03/19/17 94/61  03/13/17 (!) 96/58  03/06/17 (!) 95/54   She has an appointment with Dr. Tomi Likens on April 2nd. She is also followed by Sherilyn Cooter, PA-C for pain management.    Past Medical History:  Diagnosis Date  . Allergy   . Anemia    anemia in the past  . Angina    takes Propanolol prn and it stops her chest  . Anxiety   . Barrett esophagus    not consistently present  . Cataract   . Chronic kidney disease    kidney stones and UTI  . Complication of anesthesia    either  BP or pulse dropped with last surgery  . DDD (degenerative disc disease)    cervical and lumbar  . Dementia   . Depression    post traumatic stress  disorder  . Diabetes mellitus    sugar goes up and down diet controlled  . Dysrhythmia    brought on by stress  . External hemorrhoids   . Fatigue   . Fibromyalgia    neuropathy knees ankles and toes, bladder  . GERD (gastroesophageal reflux disease)   . H/O hyperparathyroidism   .  Headache(784.0)    migraines  . Hiatal hernia   . History of kidney stones   . Hypertension    3 small brain aneurysms  . IBS (irritable bowel syndrome)   . Internal hemorrhoids   . Macular degeneration   . Migraines   . Osteoarthritis    all over  . Osteopetrosis   . Peripheral vascular disease (HCC)    superficial phlebitis in left calf  . Personal history of colonic polyps 07/22/2013   07/2013 - 3 small adenomas - repeat colonoscopy 2018   Past Surgical History:  Procedure Laterality Date  . ABDOMINAL HYSTERECTOMY    . APPENDECTOMY    . CARDIAC CATHETERIZATION  03/22/1991   EF 61%  . CARDIOVASCULAR STRESS TEST  10/03/2006  . CHOLECYSTECTOMY    . COLONOSCOPY  06/23/2008   normal  . DILATION AND CURETTAGE OF UTERUS     x2  . ESOPHAGUS SURGERY    . EXPLORATORY  LAPAROTOMY  1993  . GASTRIC BYPASS  1999  . GASTRIC RESTRICTION SURGERY  1991  . Right Arm Surgery    . Right Knee Arthroscopy    . Rt wrist fx  2009  . stomach stappeling  1991  . STONE EXTRACTION WITH BASKET  2012  . TONSILLECTOMY    . TUBAL LIGATION    . UPPER GASTROINTESTINAL ENDOSCOPY  06/23/2008   w/Dil, Barrett's esophagus  . US ECHOCARDIOGRAPHY  01/21/2007   EF 55-60%  . US ECHOCARDIOGRAPHY  08/30/2003   EF 55-60%   Prior to Admission medications   Medication Sig Start Date End Date Taking? Authorizing Provider  acarbose (PRECOSE) 25 MG tablet Take 1 tablet (25 mg total) by mouth 3 (three) times daily with meals. 11/07/16   Renato Shin, MD  Ascorbic Acid (VITAMIN C) 1000 MG tablet Take 1,000 mg by mouth daily.    [provider]  aspirin-acetaminophen-caffeine (EXCEDRIN MIGRAINE) [phone] MG tablet Take 2 tablets by mouth 2 (two) times daily.    [provider]  beta carotene w/minerals (OCUVITE) tablet Take 1 tablet by mouth 2 (two) times daily.    [provider]  CALCIUM-MAGNESIUM-ZINC PO Take by mouth daily.    [provider]  Cholecalciferol (VITAMIN D3) 2000 UNITS TABS Take 2,000 Units by mouth daily.    [provider]  clonazePAM (KLONOPIN) 1 MG tablet Take 0.5-1 tablets (0.5-1 mg total) by mouth 2 (two) times daily as needed for anxiety. 11/08/16   Shawnee Knapp, MD  Continuous [name] Gluc Receiver (FREESTYLE LIBRE 14 DAY READER) DEVI 1 Device every 14 (fourteen) days by Does not apply route. 12/01/16   Renato Shin, MD  Cyanocobalamin (VITAMIN B-12) 1000 MCG SUBL Place 5,000 mcg under the tongue daily.     [provider]  cyclobenzaprine (FLEXERIL) 10 MG tablet Take 1 tablet (10 mg total) by mouth 3 (three) times daily as needed for muscle spasms. 11/08/16   Shawnee Knapp, MD  cycloSPORINE (RESTASIS) 0.05 % ophthalmic emulsion Place 1 drop into both eyes 2 (two) times daily. 11/08/16   Shawnee Knapp, MD  diclofenac  sodium (VOLTAREN) 1 % GEL Apply 2 g topically 4 (four) times daily. 02/12/17   Shawnee Knapp, MD  dicyclomine (BENTYL) 10 MG capsule Take 1-2 tab 3 times daily AC as needed for spasms and cramping. 11/08/16   Shawnee Knapp, MD  diltiazem 2 % GEL Apply 1 application topically 2 (two) times daily as needed (pain from fissure). 10/05/16   Brigitte Pulse,  Laurey Arrow, MD  docusate sodium (COLACE) 250 MG capsule Take 1 capsule (250 mg total) by mouth daily. 11/08/16   Shawnee Knapp, MD  esomeprazole (NEXIUM) 40 MG capsule Take 1 capsule (40 mg total) by mouth daily before breakfast. 11/08/16   Shawnee Knapp, MD  fentaNYL (DURAGESIC - DOSED MCG/HR) 50 MCG/HR Place 50 mcg onto the skin every 3 (three) days.    [provider]  furosemide (LASIX) 40 MG tablet Take 1 tablet (40 mg total) by mouth daily as needed for edema. 02/12/17   Shawnee Knapp, MD  glucose [name] test strip Use to check cbgs 6 times daily as directed by physician. Testing freq: hypoglycemia, hyperglycemia. ICD10: R73.03, E 16.2 11/08/16   Shawnee Knapp, MD  HYDROcodone-acetaminophen (NORCO) 7.5-325 MG tablet Take 1 tablet every 6 (six) hours as needed by mouth for moderate pain.    [provider]  hypromellose (SYSTANE OVERNIGHT THERAPY) 0.3 % GEL ophthalmic ointment Place 1 drop into both eyes at bedtime. Reported on 02/11/2015    [provider]  IRON PO Take by mouth.    [provider]  L-Methylfolate-Algae-B12-B6 Glade Stanford) 3-90.314-2-35 MG CAPS Take 1 capsule 2 (two) times daily by mouth.    [provider]  lidocaine (LIDODERM) 5 % Place 1 patch onto the skin as needed. Remove & Discard patch within 12 hours. May dispense as 3 month supply 11/08/16   Shawnee Knapp, MD  Lifitegrast Shirley Friar) 5 % SOLN Place 1 drop into both eyes daily.     [provider]  meloxicam (MOBIC) 15 MG tablet TAKE 1 TABLET BY MOUTH EVERY DAY 01/31/17   Gardiner Barefoot, DPM  mirabegron ER (MYRBETRIQ) 25 MG TB24 tablet Take 1 tablet (25 mg  total) by mouth daily. 11/08/16   Shawnee Knapp, MD  mupirocin ointment (BACTROBAN) 2 % Apply 1 application topically 3 (three) times daily. 10/05/16   Shawnee Knapp, MD  Naloxone HCl 0.4 MG/0.4ML SOAJ Administer 0.4mg  [place] at first sign of opioid overdose and repeat every 2 minutes as needed for resuscitation. Call 911 immediately 03/20/16   Shawnee Knapp, MD  NON [facility]  Peripheral Neuropathy Cream- Bupivacaine 1%, Doxepin 3%, Gabapentin 6%, Pentoxifylline 3%, Topiramate 1% Apply 1-2 grams to affected area 3-4 times daily Qty. 120 gm 3 refills    [provider]  Omega-3 Fatty Acids (SEA-OMEGA PO) Take 1,000 mg by mouth daily.    [provider]  ondansetron (ZOFRAN) 8 MG tablet Take 1 tablet (8 mg total) by mouth every 8 (eight) hours as needed for nausea or vomiting. 12/28/16   Shawnee Knapp, MD  ONE TOUCH LANCETS MISC Use to check cbgs 6 times daily as directed by physician. Testing freq: hypoglycemia, hyperglycemia. ICD10: R73.03, E 16.2 11/08/16   Shawnee Knapp, MD  potassium chloride SA (K-DUR,KLOR-CON) 20 MEQ tablet Take 1 tablet (20 mEq total) by mouth daily as needed (along with furosemide whenever it is used for edema). 02/12/17   Shawnee Knapp, MD  pregabalin (LYRICA) 50 MG capsule Take 1 capsule (50 mg total) by mouth 3 (three) times daily. 12/26/16   Pieter Partridge, DO  Probiotic Product (PROBIOTIC-10 PO) Take by mouth.    [provider]  pyridoxine (B-6) 200 MG tablet Take 200 mg by mouth daily.    [provider]  SUMAtriptan (IMITREX) 100 MG tablet Take 1 tablet at earliest onset of headache, may repeat in 2 hours if  headache persists or reoccurs. Patient taking differently: Take 100 mg by mouth every 3 (three) days.  02/26/16   Shawnee Knapp, MD  thiamine (VITAMIN B-1) 100 MG tablet Take 100 mg daily by mouth.    [provider]  traZODone (DESYREL) 100 MG tablet Take 0.5-1 tablets (50-100 mg total) by mouth at bedtime as needed for  sleep. And 1/2 -1 if wakes at 4am. 11/08/16   Shawnee Knapp, MD  Vitamin D, Ergocalciferol, (DRISDOL) 50000 units CAPS capsule Take 1 capsule (50,000 Units total) by mouth every 7 (seven) days. 07/22/16   Bo Merino, MD  buPROPion (WELLBUTRIN) 75 MG tablet Take 75 mg by mouth daily after breakfast.   04/10/11  [provider]   Allergies  Allergen Reactions  . Ambien [Zolpidem Tartrate]     Hallucinations  . Codeine Anaphylaxis  . Oxycodone-Acetaminophen Shortness Of Breath  . Tylox [Oxycodone-Acetaminophen] Anaphylaxis    Chest pain  . Darvocet [Propoxyphene N-Acetaminophen]     Chest pain  . Darvon [Propoxyphene] Itching  . Food Swelling    wheat-  . Lorcet [Hydrocodone-Acetaminophen]     Chest pain  . Opium     hallucinations  . Vicodin [Hydrocodone-Acetaminophen] Other (See Comments)    Chest Pain   . Wheat Bran   . Amoxicillin Nausea And Vomiting    Reaction:  Migraine headache  . Penicillins Nausea And Vomiting    Migraine Headaches   Family History  Problem Relation Age of Onset  . Stroke Mother   . Lung cancer Mother   . Heart disease Mother   . Diabetes Mother   . Hypertension Father   . Stroke Father   . Lung cancer Father   . Heart disease Father   . Kidney disease Father   . Seizures Father        epilepsy, and sisters x 2  . Pancreatic cancer Sister   . Lung cancer Sister   . Colon cancer Maternal Aunt        12 relatives  . Uterine cancer Unknown        aunt  . Heart disease Unknown        grandmother  . Clotting disorder Sister   . Heart disease Sister        x 2  . Kidney disease Sister        x 2  . Breast cancer Sister   . Colon cancer Paternal Aunt   . Colon cancer Paternal Aunt    Social History   Socioeconomic History  . Marital status: Married    Spouse name: Rushie Chestnut.  . Number of children: 2  . Years of education: None  . Highest education level: None  Social Needs  . Financial resource strain: None  . Food  insecurity - worry: None  . Food insecurity - inability: None  . Transportation needs - medical: None  . Transportation needs - non-medical: None  Occupational History  . Occupation: Disability    Employer: RETIRED  Tobacco Use  . Smoking status: Never Smoker  . Smokeless tobacco: Never Used  Substance and Sexual Activity  . Alcohol use: No  . Drug use: No  . Sexual activity: Not Currently  Other Topics Concern  . None  Social History Narrative   She retired on disability   Married   2 Children   Depression screen [facility] 2/9 03/19/2017 03/13/2017 03/06/2017 01/23/2017 11/29/2016  Decreased Interest 0 0 0 0 0  Down, Depressed, Hopeless  0 0 0 1 0  PHQ - 2 Score 0 0 0 1 0  Altered sleeping - - - - -  Tired, decreased energy - - - - -  Change in appetite - - - - -  Feeling bad or failure about yourself  - - - - -  Trouble concentrating - - - - -  Moving slowly or fidgety/restless - - - - -  Suicidal thoughts - - - - -  PHQ-9 Score - - - - -  Difficult doing work/chores - - - - -  Some recent data might be hidden    Review of Systems  Constitutional: Negative for chills, fatigue, fever and unexpected weight change.  Respiratory: Negative for cough.   Gastrointestinal: Negative for constipation, diarrhea, nausea and vomiting.  Genitourinary: Positive for flank pain.  Musculoskeletal: Positive for myalgias.  Skin: Positive for rash. Negative for wound.  Neurological: Negative for dizziness, weakness and headaches.       Objective:   Physical Exam  Constitutional: She is oriented to person, place, and time. She appears well-developed and well-nourished. No distress.  HENT:  Head: Normocephalic and atraumatic.  Eyes: EOM are normal. Pupils are equal, round, and reactive to light.  Neck: Neck supple.  Cardiovascular: Normal rate.  Pulses:      Dorsalis pedis pulses are 2+ on the right side, and 2+ on the left side.       Posterior tibial pulses are 2+ on the right side, and 3+  on the left side.  Pulmonary/Chest: Effort normal and breath sounds normal. No respiratory distress. She has no wheezes.  Abdominal: Bowel sounds are increased.  Musculoskeletal: Normal range of motion. She exhibits no edema (pitting).  Neurological: She is alert and oriented to person, place, and time.  Skin: Skin is warm and dry.  Psychiatric: She has a normal mood and affect. Her behavior is normal.  Nursing note and vitals reviewed.   BP 94/61   Pulse 77   Temp 98.3 F (36.8 C) (Oral)   Resp 16   Ht 5\' 5"  (1.651 m)   Wt 118 lb 3.2 oz (53.6 kg)   SpO2 95%   BMI 19.67 kg/m    Results for orders placed or performed in visit on 03/19/17  POCT Microscopic Urinalysis ([facility])  Result Value Ref Range   WBC,UR,HPF,POC None None WBC/hpf   RBC,UR,HPF,POC None None RBC/hpf   Bacteria None None, Too numerous to count   Mucus Absent Absent   Epithelial Cells, UR Per Microscopy Few (A) None, Too numerous to count cells/hpf  POCT urinalysis dipstick  Result Value Ref Range   Color, UA yellow yellow   Clarity, UA clear clear   Glucose, UA negative negative mg/dL   Bilirubin, UA negative negative   Ketones, POC UA trace (5) (A) negative mg/dL   Spec Grav, UA 1.025 1.010 - 1.025   [name], UA negative negative   pH, UA 5.5 5.0 - 8.0   Protein Ur, POC negative negative mg/dL   Urobilinogen, UA 0.2 0.2 or 1.0 E.U./dL   Nitrite, UA Negative Negative   Leukocytes, UA Negative Negative   Orthostatic VS for the past 24 hrs (Last 3 readings):  BP- Lying Pulse- Lying BP- Sitting Pulse- Sitting BP- Standing at 0 minutes Pulse- Standing at 0 minutes  03/19/17 1538 93/57 66 101/68 77 97/65 75            Assessment & Plan:  ABI's done 08/2016.   1. Urinary  frequency - ua nml  2. Idiopathic hypotension - orthostatic vitals today show that pt is hypotensive but is not orthostatic.  She has been trying to eat more salt in her diet to increase her BP but has just made her retain fluid and c/o  edema so ok to restart her lasix 40 w/ K 20 prn which she had stopped upon my request at last visit to see if that would help increase her BP which it did not.  Wonder if her endocrinologist Dr. Loanne Drilling (who pt sees for atypical pre-DM with reactive hypoglycemia) might be able to help with the w/u/eval of this worsening hypotension which is distressing pt  3. Rosacea - requests cream - try metrogel which has worked well for her in the past  4. Neuropathic pain of both legs - this seems HIGHLY atypical and quite rapidly severe and progressive to be DM neuropathy esp as didn't have any sig sign on EMG/NCV.  Unknown etiology but she does have an appointment w/ her neurologist Dr. Tomi Likens on 4/2 so hopefully can address further w/u for etiology then. Her sxs are so severe and rapidly progressive - I wonder if she could have something different than progressive neuropathy - ?complex regional pain syndrome - would also be very atypical presentation of that as well.  Will be interested in her neurologist Dr. Georgie Chard guidance. Consider referral for eval by PM&R dr. 6 mos prior B12 >2000 with normal homocysteine and methylmalonic acid Ok to cont on lyrica 50 bid - the memory problems, tremor, sudden muscle contractions and weakness, subjective slurred speech, etc with which she presented 2 wks prior 2/21 has sig resolved since she decreased her lyrica dose to this (sxs began shortly after increasing dose).  Increase trazodone back to 100 from 50 as was sleeping better on it.  Could consider changing it to other TCA that we more commonly use to treat chronic pain and nerve pain such as amytriptyline or nortriptyline - I do not know whether trazodone has benefit for pain like some others do.  Encouraged pt to discuss w/ her pain management provider Cottie Banda.   5. Herpes zoster without complication - see pics above - very soft call - more likely to be abrasion from 10u or non-specific passing rash but due to  pt's numerous medical sxs/complications and high stress levels there is a small possibility of zoster which would cause additional sig physical and mental stress to pt whose already very distressed by her health so will air on the side of caution and cover w/ valtrex. RTC if worsening.    Hypermagnesemia - resolved on recheck. Ok to resume the otc supp/vitamins that she wants other than the straight ca-mag-zinc supp - will likely stay off that permanently.  Consider having pt restart a different zinc supp since underweight??    Orders Placed This Encounter  Procedures  . Urine Culture  . Orthostatic vital signs  . POCT Microscopic Urinalysis ([facility])  . POCT urinalysis dipstick   Requests med refills be sent to [place] ordered this encounter  Medications  . valACYclovir (VALTREX) 1000 MG tablet    Sig: Take 1 tablet (1,000 mg total) by mouth 3 (three) times daily.    Dispense:  21 tablet    Refill:  0  . metroNIDAZOLE (METROGEL) 1 % gel    Sig: Apply topically daily.    Dispense:  45 g    Refill:  0    I personally performed the services described  in this documentation, which was scribed in my presence. The recorded information has been reviewed and considered, and addended by me as needed.   [name], M.D.  Primary Care at [facility] [address] [place],  [phone] phone [phone] fax  03/22/17 9:40 AM

## 2017-03-19 NOTE — Patient Instructions (Addendum)
Ok to resume your lasix if you need to do to swelling. Try to decrease salt and increase PROTEIN to hold the fluids in your system.  You can resume all of the supplements that you would like other than the calcium-magnesium-zinc supplement.  Restart the full dose trazodone as hopefully it will help with nerve pain and your body/brain/nerves.  We could consider changing the trazodone to one of its sister medicines amitryptiline and nortryptiline that we typically think of helping more with pain as well as sleep (but typically aren't as heavy as a sedative effect as the trazodone.)  Ok to continue the lyrica at night to help make your symptoms bearable.  I wonder if you could have something more on the spectrum of the complex regional pain syndrome (info included at the bottom). Let me know what you think. If we wanted to look into this more, we would probably ask Dr. Tomi Likens or a "physical medicine and rehabilitation (PM&R)" MD specialist to consult.   We could consider asking Dr. Loanne Drilling to weigh in his opinion as to potential causes/treatment of symptomatic hypotension.    Your urinalysis looks great.  IF you received an x-ray today, you will receive an invoice from [facility]. Please contact [facility] at [phone] with questions or concerns regarding your invoice.   IF you received labwork today, you will receive an invoice from [place]. Please contact [facility] at [phone] with questions or concerns regarding your invoice.   Our billing staff will not be able to assist you with questions regarding bills from these companies.  You will be contacted with the lab results as soon as they are available. The fastest way to get your results is to activate your My Chart account. Instructions are located on the last page of this paperwork. If you have not heard from [place] regarding the results in 2 weeks, please contact this office.     Neuropathic Pain Neuropathic pain is  pain caused by damage to the nerves that are responsible for certain sensations in your body (sensory nerves). The pain can be caused by damage to:  The sensory nerves that send signals to your spinal cord and brain (peripheral nervous system).  The sensory nerves in your brain or spinal cord (central nervous system).  Neuropathic pain can make you more sensitive to pain. What would be a minor sensation for most people may feel very painful if you have neuropathic pain. This is usually a long-term condition that can be difficult to treat. The type of pain can differ from person to person. It may start suddenly (acute), or it may develop slowly and last for a long time (chronic). Neuropathic pain may come and go as damaged nerves heal or may stay at the same level for years. It often causes emotional distress, loss of sleep, and a lower quality of life. What are the causes? The most common cause of damage to a sensory nerve is diabetes. Many other diseases and conditions can also cause neuropathic pain. Causes of neuropathic pain can be classified as:  Toxic. Many drugs and chemicals can cause toxic damage. The most common cause of toxic neuropathic pain is damage from drug treatment for cancer (chemotherapy).  Metabolic. This type of pain can happen when a disease causes imbalances that damage nerves. Diabetes is the most common of these diseases. Vitamin B deficiency caused by long-term alcohol abuse is another common cause.  Traumatic. Any injury that cuts, crushes, or stretches a nerve can cause damage  and pain. A common example is feeling pain after losing an arm or leg (phantom limb pain).  Compression-related. If a sensory nerve gets trapped or compressed for a long period of time, the blood supply to the nerve can be cut off.  Vascular. Many blood vessel diseases can cause neuropathic pain by decreasing blood supply and oxygen to nerves.  Autoimmune. This type of pain results from diseases  in which the body's defense system mistakenly attacks sensory nerves. Examples of autoimmune diseases that can cause neuropathic pain include lupus and multiple sclerosis.  Infectious. Many types of viral infections can damage sensory nerves and cause pain. Shingles infection is a common cause of this type of pain.  Inherited. Neuropathic pain can be a symptom of many diseases that are passed down through families (genetic).  What are the signs or symptoms? The main symptom is pain. Neuropathic pain is often described as:  Burning.  Shock-like.  Stinging.  Hot or cold.  Itching.  How is this diagnosed? No single test can diagnose neuropathic pain. Your health care provider will do a physical exam and ask you about your pain. You may use a pain scale to describe how bad your pain is. You may also have tests to see if you have a high sensitivity to pain and to help find the cause and location of any sensory nerve damage. These tests may include:  Imaging studies, such as: ? X-rays. ? CT scan. ? MRI.  Nerve conduction studies to test how well nerve signals travel through your sensory nerves (electrodiagnostic testing).  Stimulating your sensory nerves through electrodes on your skin and measuring the response in your spinal cord and brain (somatosensory evoked potentials).  How is this treated? Treatment for neuropathic pain may change over time. You may need to try different treatment options or a combination of treatments. Some options include:  Over-the-counter pain relievers.  Prescription medicines. Some medicines used to treat other conditions may also help neuropathic pain. These include medicines to: ? Control seizures (anticonvulsants). ? Relieve depression (antidepressants).  Prescription-strength pain relievers (narcotics). These are usually used when other pain relievers do not help.  Transcutaneous nerve stimulation (TENS). This uses electrical currents to block  painful nerve signals. The treatment is painless.  Topical and local anesthetics. These are medicines that numb the nerves. They can be injected as a nerve block or applied to the skin.  Alternative treatments, such as: ? Acupuncture. ? Meditation. ? Massage. ? Physical therapy. ? Pain management programs. ? Counseling.  Follow these instructions at home:  Learn as much as you can about your condition.  Take medicines only as directed by your health care provider.  Work closely with all your health care providers to find what works best for you.  Have a good support system at home.  Consider joining a chronic pain support group. Contact a health care provider if:  Your pain treatments are not helping.  You are having side effects from your medicines.  You are struggling with fatigue, mood changes, depression, or anxiety. This information is not intended to replace advice given to you by your health care provider. Make sure you discuss any questions you have with your health care provider. Document Released: 09/28/2003 Document Revised: 07/21/2015 Document Reviewed: 06/10/2013 Elsevier Interactive Patient Education  2018 [employer].   Complex Regional Pain Syndrome Complex regional pain syndrome (CRPS) is a nerve disorder that causes long-lasting (chronic) pain, usually in a hand, arm, leg, or foot. CRPS usually  follows an injury or trauma, such as a fracture or sprain. There are two types of CRPS:  Type 1. This type occurs after an injury or trauma with no known damage to a nerve.  Type 2. This type occurs after injury or trauma damages a nerve.  There are three stages of the condition:  Stage 1. This stage, called the acute stage, may last for three months.  Stage 2. This stage, called the dystrophic stage, may last for three to 12 months.  Stage 3. This stage, called the atrophic stage, may start after one year.  CRPS ranges from mild to severe. For most people  CRPS is mild and recovery happens over time. For others, CRPS lasts a very long time and is debilitating. What are the causes? The exact cause of CRPS is not known. What increases the risk? You may be at increased risk if:  You are a woman.  You are approximately 70 years of age.  You have any of the following: ? A family history of CRPS. ? An injury or surgery. ? An infection. ? Cancer. ? Neck problems. ? A stroke. ? A heart attack. ? Asthma.  What are the signs or symptoms? Signs and symptoms in the affected limb are different for each stage. Signs and symptoms of stage 1 include:  Burning pain.  A pins and needles sensation.  Extremely sensitive skin.  Swelling.  Joint stiffness.  Warmth and redness.  Excessive sweating.  Hair and nail growth that is faster than normal.  Signs and symptoms of stage 2 include:  Spreading of pain to the whole limb.  Increased skin sensitivity.  Increased swelling and stiffness.  Coolness of the skin.  Blue discoloration of skin.  Loss of skin wrinkles.  Brittle fingernails.  Signs and symptoms of stage 3 include:  Pain that spreads to other areas of the body but becomes less severe.  More stiffness, leading to loss of motion.  Skin that is pale, dry, shiny, and tightly stretched.  How is this diagnosed? There is no test to diagnose CRPS. Your health care provider will make a diagnosis based on your signs and symptoms and a physical exam. The exam may include tests to rule out other possible causes of your symptoms. Sometimes imaging tests are done, such as an MRI or bone scan. These tests check for bone changes that might indicate CRPS. How is this treated? Early treatment may prevent CRPS from advancing past stage 1. There is no one treatment that works for everyone. Treatment options may include:  Medicines, such as: ? Nonsteroidal-anti-inflammatory drugs (NSAIDS). ? Steroids. ? Blood pressure  drugs. ? Antidepressants. ? Anti-seizure drugs. ? Pain relievers.  Exercise.  Occupational and physical therapy.  Biofeedback.  Mental health counseling.  Numbing injections.  Spinal surgery to implant a spinal cord stimulator or a pain pump.  Follow these instructions at home:  Take medicines only as directed by your health care provider.  Follow an exercise program as directed by your health care provider.  Maintain a healthy weight.  Keep all follow-up visits as directed by your health care provider. This is important. Contact a health care provider if:  Your symptoms change.  Your symptoms get worse.  You develop anxiety or depression. This information is not intended to replace advice given to you by your health care provider. Make sure you discuss any questions you have with your health care provider. Document Released: 12/21/2001 Document Revised: 06/08/2015 Document Reviewed: 09/27/2013 Elsevier Interactive  Patient Education  [employer].

## 2017-03-20 LAB — URINE CULTURE

## 2017-03-21 LAB — CORTISOL, SALIVARY: SALIVARY CORTISOL, MS: 1.27 ug/dL

## 2017-03-24 ENCOUNTER — Telehealth: Payer: Self-pay | Admitting: Family Medicine

## 2017-03-24 DIAGNOSIS — F411 Generalized anxiety disorder: Secondary | ICD-10-CM

## 2017-03-24 NOTE — Telephone Encounter (Signed)
Copied from [place] [id number]. Topic: Quick Communication - Rx Refill/Question >> Mar 24, 2017  2:59 PM Lysle Morales L, NT wrote: Medication: clonazePAM (KLONOPIN) 1 MG tablet, cyclobenzaprine (FLEXERIL) 10 MG tablet,, furosemide (LASIX) 40 MG tablet , acarbose (PRECOSE) 25 MG tablet and also Cymbalta dose unknown ,ondansetron (ZOFRAN) 8 MG tablet, and also a potassium pill  Has the patient contacted their pharmacy?  No will not call in and request a refill PCP's office is aware of this   (Agent: If no, request that the patient contact the pharmacy for the refill. Preferred Pharm[facility] (with phone number or street name [facility] [place], [place] - [address] [phone] (Phone) [phone] (Fax Agent: Please be advised that RX refills may take up to 3 business days. We ask that you follow-up with your pharmacy. If any questions please call the patient at [phone]

## 2017-03-25 ENCOUNTER — Other Ambulatory Visit: Payer: Self-pay

## 2017-03-25 DIAGNOSIS — H524 Presbyopia: Secondary | ICD-10-CM | POA: Diagnosis not present

## 2017-03-25 DIAGNOSIS — D23111 Other benign neoplasm of skin of right upper eyelid, including canthus: Secondary | ICD-10-CM | POA: Diagnosis not present

## 2017-03-25 DIAGNOSIS — H353131 Nonexudative age-related macular degeneration, bilateral, early dry stage: Secondary | ICD-10-CM | POA: Diagnosis not present

## 2017-03-25 DIAGNOSIS — H16223 Keratoconjunctivitis sicca, not specified as Sjogren's, bilateral: Secondary | ICD-10-CM | POA: Diagnosis not present

## 2017-03-25 DIAGNOSIS — H04123 Dry eye syndrome of bilateral lacrimal glands: Secondary | ICD-10-CM | POA: Diagnosis not present

## 2017-03-25 LAB — HM DIABETES EYE EXAM

## 2017-03-25 MED ORDER — ONDANSETRON HCL 8 MG PO TABS: 8.0000 mg | ORAL_TABLET | Freq: Three times a day (TID) | ORAL | 0 refills | Status: DC | PRN

## 2017-03-25 NOTE — Telephone Encounter (Signed)
LOV 03/19/17 Dr. Brigitte Pulse [facility] by mail

## 2017-03-29 MED ORDER — CYCLOBENZAPRINE HCL 10 MG PO TABS: 10.0000 mg | ORAL_TABLET | Freq: Three times a day (TID) | ORAL | 1 refills | Status: DC | PRN

## 2017-03-29 MED ORDER — CLONAZEPAM 1 MG PO TABS: 0.5000 mg | ORAL_TABLET | Freq: Two times a day (BID) | ORAL | 1 refills | Status: DC | PRN

## 2017-03-29 MED ORDER — POTASSIUM CHLORIDE CRYS ER 20 MEQ PO TBCR: 20.0000 meq | EXTENDED_RELEASE_TABLET | Freq: Every day | ORAL | 0 refills | Status: DC | PRN

## 2017-03-29 MED ORDER — FUROSEMIDE 40 MG PO TABS: 40.0000 mg | ORAL_TABLET | Freq: Every day | ORAL | 0 refills | Status: DC | PRN

## 2017-03-29 NOTE — Telephone Encounter (Signed)
Needs to get the acarbose from Dr. Loanne Drilling. I though she went off the cymbalta 6 mos ago- she should have ran out of it 6 mos ago and she told the cardiologist she wasn't taking it so was d/c'd from med list.  Was this an error or does she want to restart it? All other meds sent in.

## 2017-03-31 MED ORDER — DULOXETINE HCL 30 MG PO CPEP: 30.0000 mg | ORAL_CAPSULE | Freq: Every day | ORAL | 0 refills | Status: DC

## 2017-03-31 NOTE — Telephone Encounter (Signed)
Ok to resume cymbalta - rx sent to [facility]

## 2017-03-31 NOTE — Telephone Encounter (Signed)
Patient states she thought you wanted her to go back on cymbalta.  She states she did feel better on it she states she is felling really depressed about her neuropathy.   She states she sent an IChart message today about the tingling in her legs and it is moving up her calf.

## 2017-04-01 ENCOUNTER — Ambulatory Visit: Payer: Medicare Other | Admitting: Psychiatry

## 2017-04-01 DIAGNOSIS — H16223 Keratoconjunctivitis sicca, not specified as Sjogren's, bilateral: Secondary | ICD-10-CM | POA: Diagnosis not present

## 2017-04-01 DIAGNOSIS — H40023 Open angle with borderline findings, high risk, bilateral: Secondary | ICD-10-CM | POA: Diagnosis not present

## 2017-04-01 DIAGNOSIS — H353131 Nonexudative age-related macular degeneration, bilateral, early dry stage: Secondary | ICD-10-CM | POA: Diagnosis not present

## 2017-04-01 DIAGNOSIS — D23111 Other benign neoplasm of skin of right upper eyelid, including canthus: Secondary | ICD-10-CM | POA: Diagnosis not present

## 2017-04-01 DIAGNOSIS — H524 Presbyopia: Secondary | ICD-10-CM | POA: Diagnosis not present

## 2017-04-01 DIAGNOSIS — E119 Type 2 diabetes mellitus without complications: Secondary | ICD-10-CM | POA: Diagnosis not present

## 2017-04-01 DIAGNOSIS — H04123 Dry eye syndrome of bilateral lacrimal glands: Secondary | ICD-10-CM | POA: Diagnosis not present

## 2017-04-03 ENCOUNTER — Other Ambulatory Visit: Payer: Self-pay

## 2017-04-03 ENCOUNTER — Ambulatory Visit (INDEPENDENT_AMBULATORY_CARE_PROVIDER_SITE_OTHER): Payer: Medicare Other | Admitting: Family Medicine

## 2017-04-03 ENCOUNTER — Ambulatory Visit (INDEPENDENT_AMBULATORY_CARE_PROVIDER_SITE_OTHER): Payer: Medicare Other

## 2017-04-03 VITALS — BP 96/60 | HR 86 | Temp 98.5°F | Resp 18 | Ht 65.0 in | Wt 115.8 lb

## 2017-04-03 DIAGNOSIS — M542 Cervicalgia: Secondary | ICD-10-CM

## 2017-04-03 DIAGNOSIS — M545 Low back pain: Secondary | ICD-10-CM | POA: Diagnosis not present

## 2017-04-03 DIAGNOSIS — G609 Hereditary and idiopathic neuropathy, unspecified: Secondary | ICD-10-CM | POA: Diagnosis not present

## 2017-04-03 DIAGNOSIS — G5793 Unspecified mononeuropathy of bilateral lower limbs: Secondary | ICD-10-CM

## 2017-04-03 DIAGNOSIS — M5030 Other cervical disc degeneration, unspecified cervical region: Secondary | ICD-10-CM

## 2017-04-03 DIAGNOSIS — I950 Idiopathic hypotension: Secondary | ICD-10-CM | POA: Diagnosis not present

## 2017-04-03 DIAGNOSIS — E11649 Type 2 diabetes mellitus with hypoglycemia without coma: Secondary | ICD-10-CM

## 2017-04-03 DIAGNOSIS — Z1159 Encounter for screening for other viral diseases: Secondary | ICD-10-CM

## 2017-04-03 DIAGNOSIS — M797 Fibromyalgia: Secondary | ICD-10-CM | POA: Diagnosis not present

## 2017-04-03 DIAGNOSIS — M5137 Other intervertebral disc degeneration, lumbosacral region: Secondary | ICD-10-CM | POA: Diagnosis not present

## 2017-04-03 DIAGNOSIS — M4316 Spondylolisthesis, lumbar region: Secondary | ICD-10-CM | POA: Diagnosis not present

## 2017-04-03 DIAGNOSIS — Z114 Encounter for screening for human immunodeficiency virus [HIV]: Secondary | ICD-10-CM | POA: Diagnosis not present

## 2017-04-03 DIAGNOSIS — E162 Hypoglycemia, unspecified: Secondary | ICD-10-CM

## 2017-04-03 DIAGNOSIS — G8929 Other chronic pain: Secondary | ICD-10-CM

## 2017-04-03 DIAGNOSIS — M47812 Spondylosis without myelopathy or radiculopathy, cervical region: Secondary | ICD-10-CM

## 2017-04-03 DIAGNOSIS — M50323 Other cervical disc degeneration at C6-C7 level: Secondary | ICD-10-CM | POA: Diagnosis not present

## 2017-04-03 DIAGNOSIS — I951 Orthostatic hypotension: Secondary | ICD-10-CM

## 2017-04-03 DIAGNOSIS — M50322 Other cervical disc degeneration at C5-C6 level: Secondary | ICD-10-CM | POA: Diagnosis not present

## 2017-04-03 NOTE — Progress Notes (Signed)
Subjective:  By signing my name below, I, [name], attest that this documentation has been prepared under the direction and in the presence of [name], MD Electronically Signed: [name], ED Scribe 04/03/2017 at 10:21 AM.   Patient ID: Kayla [name], female    DOB: Mar 13, 1945, 72 y.o.   MRN: [id number]  Chief Complaint  Patient presents with  . Peripheral Neuropathy    pt states she has gotten worse since last visit. Pt states she is "trapped" in bed. Pt states she wants to apply for assistance. Pt states she wants heavy duty testing to understand why her nuropathy is pregressing so fast.  . Follow-up   HPI Kayla [name] is a 72 y.o. female who presents to Primary Care at [facility] for f/u. Pt states that neuropathy has worsened significantly since last visit on 3/6. She is now having pain in her feet (L worse than R), calves, mid thighs, mid back pain and hands to her thumbs (R worse than L), which she states have been so severe that she's been in bed since last visit due to pain. Reports burning, tingling sensation with lying down, increased with bearing weight. She reports that pain is tolerable in the morning but worsens as the day progresses. She suspects that symptoms may be attributed to Metanx or Agent Orange exposure x 15 yrs. Pt states that she is bed ridden now due to severity of symptoms and would like assistance, including Meals on Wheels and a prescription for a wheelchair. She is taking Lyrica bid. Pt has not yet restarted Cymbalta.  HAs Pt reports that HAs have not resolved regardless of what she takes. States she is still waking with severe HAs. She does report that her fentanyl was decreased.  Past Medical History:  Diagnosis Date  . Allergy   . Anemia    anemia in the past  . Angina    takes Propanolol prn and it stops her chest  . Anxiety   . Barrett esophagus    not consistently present  . Cataract   . Chronic kidney disease    kidney stones and  UTI  . Complication of anesthesia    either  BP or pulse dropped with last surgery  . DDD (degenerative disc disease)    cervical and lumbar  . Dementia   . Depression    post traumatic stress  disorder  . Diabetes mellitus    sugar goes up and down diet controlled  . Dysrhythmia    brought on by stress  . External hemorrhoids   . Fatigue   . Fibromyalgia    neuropathy knees ankles and toes, bladder  . GERD (gastroesophageal reflux disease)   . H/O hyperparathyroidism   . Headache(784.0)    migraines  . Hiatal hernia   . History of kidney stones   . Hypertension    3 small brain aneurysms  . IBS (irritable bowel syndrome)   . Internal hemorrhoids   . Macular degeneration   . Migraines   . Osteoarthritis    all over  . Osteopetrosis   . Peripheral vascular disease (HCC)    superficial phlebitis in left calf  . Personal history of colonic polyps 07/22/2013   07/2013 - 3 small adenomas - repeat colonoscopy 2018   Current Outpatient Medications on File Prior to Visit  Medication Sig Dispense Refill  . acarbose (PRECOSE) 25 MG tablet Take 1 tablet (25 mg total) by mouth 3 (three) times daily with meals.  90 tablet 5  . Ascorbic Acid (VITAMIN C) 1000 MG tablet Take 1,000 mg by mouth daily.    Marland Kitchen aspirin-acetaminophen-caffeine (EXCEDRIN MIGRAINE) 250-250-65 MG tablet Take 2 tablets by mouth 2 (two) times daily.    . beta carotene w/minerals (OCUVITE) tablet Take 1 tablet by mouth 2 (two) times daily.    Marland Kitchen CALCIUM-MAGNESIUM-ZINC PO Take by mouth daily.    . Cholecalciferol (VITAMIN D3) 2000 UNITS TABS Take 2,000 Units by mouth daily.    . clonazePAM (KLONOPIN) 1 MG tablet Take 0.5-1 tablets (0.5-1 mg total) by mouth 2 (two) times daily as needed for anxiety. 180 tablet 1  . Continuous Blood Gluc Receiver (FREESTYLE LIBRE 14 DAY READER) DEVI 1 Device every 14 (fourteen) days by Does not apply route. 6 Device 3  . Cyanocobalamin (VITAMIN B-12) 1000 MCG SUBL Place 5,000 mcg under the  tongue daily.     . cyclobenzaprine (FLEXERIL) 10 MG tablet Take 1 tablet (10 mg total) by mouth 3 (three) times daily as needed for muscle spasms. 270 tablet 1  . cycloSPORINE (RESTASIS) 0.05 % ophthalmic emulsion Place 1 drop into both eyes 2 (two) times daily. 5.5 mL 5  . diclofenac sodium (VOLTAREN) 1 % GEL Apply 2 g topically 4 (four) times daily. 700 g 3  . dicyclomine (BENTYL) 10 MG capsule Take 1-2 tab 3 times daily AC as needed for spasms and cramping. 270 capsule 1  . diltiazem 2 % GEL Apply 1 application topically 2 (two) times daily as needed (pain from fissure). 30 g 2  . docusate sodium (COLACE) 250 MG capsule Take 1 capsule (250 mg total) by mouth daily. (Patient not taking: Reported on 03/19/2017) 90 capsule 3  . DULoxetine (CYMBALTA) 30 MG capsule Take 1 capsule (30 mg total) by mouth daily. X 2 weeks, then bid 180 capsule 0  . esomeprazole (NEXIUM) 40 MG capsule Take 1 capsule (40 mg total) by mouth daily before breakfast. 90 capsule 3  . fentaNYL (DURAGESIC - DOSED MCG/HR) 50 MCG/HR Place 50 mcg onto the skin every 3 (three) days.    . furosemide (LASIX) 40 MG tablet Take 1 tablet (40 mg total) by mouth daily as needed for edema. 90 tablet 0  . glucose blood test strip Use to check cbgs 6 times daily as directed by physician. Testing freq: hypoglycemia, hyperglycemia. ICD10: R73.03, E 16.2 600 each 5  . HYDROcodone-acetaminophen (NORCO) 7.5-325 MG tablet Take 1 tablet every 6 (six) hours as needed by mouth for moderate pain.    . hypromellose (SYSTANE OVERNIGHT THERAPY) 0.3 % GEL ophthalmic ointment Place 1 drop into both eyes at bedtime. Reported on 02/11/2015    . IRON PO Take by mouth.    Marland Kitchen L-Methylfolate-Algae-B12-B6 (METANX) 3-90.314-2-35 MG CAPS Take 1 capsule 2 (two) times daily by mouth.    . lidocaine (LIDODERM) 5 % Place 1 patch onto the skin as needed. Remove & Discard patch within 12 hours. May dispense as 3 month supply 90 patch 3  . Lifitegrast (XIIDRA) 5 % SOLN Place 1  drop into both eyes daily.     . meloxicam (MOBIC) 15 MG tablet TAKE 1 TABLET BY MOUTH EVERY DAY 30 tablet 0  . metroNIDAZOLE (METROGEL) 1 % gel Apply topically daily. 45 g 0  . mirabegron ER (MYRBETRIQ) 25 MG TB24 tablet Take 1 tablet (25 mg total) by mouth daily. 90 tablet 3  . mupirocin ointment (BACTROBAN) 2 % Apply 1 application topically 3 (three) times daily. 30 g 1  .  Naloxone HCl 0.4 MG/0.4ML SOAJ Administer 0.75m Sedillo at first sign of opioid overdose and repeat every 2 minutes as needed for resuscitation. Call 911 immediately 5 Package 0  . NON FORMULARY [facility]  Peripheral Neuropathy Cream- Bupivacaine 1%, Doxepin 3%, Gabapentin 6%, Pentoxifylline 3%, Topiramate 1% Apply 1-2 grams to affected area 3-4 times daily Qty. 120 gm 3 refills    . ondansetron (ZOFRAN) 8 MG tablet Take 1 tablet (8 mg total) by mouth every 8 (eight) hours as needed for nausea or vomiting. 60 tablet 0  . ONE TOUCH LANCETS MISC Use to check cbgs 6 times daily as directed by physician. Testing freq: hypoglycemia, hyperglycemia. ICD10: R73.03, E 16.2 600 each 5  . potassium chloride SA (K-DUR,KLOR-CON) 20 MEQ tablet Take 1 tablet (20 mEq total) by mouth daily as needed (only take along with the furosemide). 90 tablet 0  . pregabalin (LYRICA) 50 MG capsule Take 1 capsule (50 mg total) by mouth 3 (three) times daily. 90 capsule 3  . Probiotic Product (PROBIOTIC-10 PO) Take by mouth.    . pyridoxine (B-6) 200 MG tablet Take 200 mg by mouth daily.    . SUMAtriptan (IMITREX) 100 MG tablet Take 1 tablet at earliest onset of headache, may repeat in 2 hours if headache persists or reoccurs. (Patient taking differently: Take 100 mg by mouth every 3 (three) days. ) 30 tablet 1  . traZODone (DESYREL) 100 MG tablet Take 0.5-1 tablets (50-100 mg total) by mouth at bedtime as needed for sleep. And 1/2 -1 if wakes at 4am. 90 tablet 1  . valACYclovir (VALTREX) 1000 MG tablet Take 1 tablet (1,000 mg total) by mouth 3 (three)  times daily. 21 tablet 0  . Vitamin D, Ergocalciferol, (DRISDOL) 50000 units CAPS capsule Take 1 capsule (50,000 Units total) by mouth every 7 (seven) days. 12 capsule 0  . [DISCONTINUED] buPROPion (WELLBUTRIN) 75 MG tablet Take 75 mg by mouth daily after breakfast.      No current facility-administered medications on file prior to visit.    Allergies  Allergen Reactions  . Ambien [Zolpidem Tartrate]     Hallucinations  . Codeine Anaphylaxis  . Oxycodone-Acetaminophen Shortness Of Breath  . Tylox [Oxycodone-Acetaminophen] Anaphylaxis    Chest pain  . Darvocet [Propoxyphene N-Acetaminophen]     Chest pain  . Darvon [Propoxyphene] Itching  . Food Swelling    wheat-  . Lorcet [Hydrocodone-Acetaminophen]     Chest pain  . Opium     hallucinations  . Vicodin [Hydrocodone-Acetaminophen] Other (See Comments)    Chest Pain   . Wheat Bran   . Amoxicillin Nausea And Vomiting    Reaction:  Migraine headache  . Penicillins Nausea And Vomiting    Migraine Headaches   Review of Systems  Musculoskeletal: Positive for back pain and myalgias.  Neurological: Positive for headaches.      Objective:   Physical Exam  Constitutional: She is oriented to person, place, and time. She appears well-developed and well-nourished. No distress.  HENT:  Head: Normocephalic and atraumatic.  Eyes: Conjunctivae and EOM are normal.  Neck: Neck supple. No tracheal deviation present.  Cardiovascular: Normal rate.  Pulmonary/Chest: Effort normal. No respiratory distress.  Musculoskeletal: Normal range of motion.  Neurological: She is alert and oriented to person, place, and time.  Skin: Skin is warm and dry.  Psychiatric: She has a normal mood and affect. Her behavior is normal.  Nursing note and vitals reviewed.  Vitals:   04/03/17 1013  BP: 96/60  Pulse: 86  Resp: 18  Temp: 98.5 F (36.9 C)  TempSrc: Oral  SpO2: 98%  Weight: 115 lb 12.8 oz (52.5 kg)  Height: '5\' 5"'  (1.651 m)      Assessment  & Plan:   1. Cervicalgia   2. Idiopathic peripheral neuropathy   3. Chronic midline low back pain, with sciatica presence unspecified   4. Orthostatic hypotension   5. Hypoglycemia   6. DISC DISEASE, CERVICAL   7. DISC DISEASE, LUMBAR   8. Cervical facet joint syndrome   9. Fibromyalgia   10. Idiopathic hypotension   11. Neuropathic pain of both legs   12. Type 2 diabetes mellitus with hypoglycemia without coma, without long-term current use of insulin (HCC)   HGBA1c 2 mos ago Vit B12 >2000 6 mos ago with nomral homocysteine and methylmalonic acid tsh nml 1 mo ago ANA neg 6 mos ago ESR nml 6 mos ago Neg RF 6 mos ago  Consider tsting for urine/blood heavy metals and prophyrins???  Consider testing for Sjogren's syndrome with anti-Ro, anti-La antibodies Consider Anti-Hu antigodies Lyme?    Orders Placed This Encounter  Procedures  . DG Cervical Spine 2 or 3 views    Standing Status:   Future    Number of Occurrences:   1    Standing Expiration Date:   04/03/2018    Order Specific Question:   Reason for Exam (SYMPTOM  OR DIAGNOSIS REQUIRED)    Answer:   neck pain with Rt>Left fingers neuralgia, lumbago w/ neuralgia pain in Lt>Rt lower ext to thighs    Order Specific Question:   Preferred imaging location?    Answer:   External  . DG Lumbar Spine 2-3 Views    Standing Status:   Future    Number of Occurrences:   1    Standing Expiration Date:   04/03/2018    Order Specific Question:   Reason for Exam (SYMPTOM  OR DIAGNOSIS REQUIRED)    Answer:   neck pain with Rt>Left fingers neuralgia, lumbago w/ neuralgia pain in Lt>Rt lower ext to thighs    Order Specific Question:   Preferred imaging location?    Answer:   External  . Ambulatory referral to Home Health    Referral Priority:   Routine    Referral Type:   Home Health Care    Referral Reason:   Specialty Services Required    Requested Specialty:   [facility]    Number of Visits Requested:   1    No orders of  the defined types were placed in this encounter.   I personally performed the services described in this documentation, which was scribed in my presence. The recorded information has been reviewed and considered, and addended by me as needed.   [name], M.D.  Primary Care at [facility] [address] [place], [place] [address] [phone] phone [phone] fax  04/03/17 11:01 AM

## 2017-04-03 NOTE — Patient Instructions (Addendum)
IF you received an x-ray today, you will receive an invoice from [facility]. Please contact [facility] at [phone] with questions or concerns regarding your invoice.   IF you received labwork today, you will receive an invoice from [facility]. Please contact [facility] at [phone] with questions or concerns regarding your invoice.   Our billing staff will not be able to assist you with questions regarding bills from these companies.  You will be contacted with the lab results as soon as they are available. The fastest way to get your results is to activate your My Chart account. Instructions are located on the last page of this paperwork. If you have not heard from [place] regarding the results in 2 weeks, please contact this office.     Neuropathic Pain Neuropathic pain is pain caused by damage to the nerves that are responsible for certain sensations in your body (sensory nerves). The pain can be caused by damage to:  The sensory nerves that send signals to your spinal cord and brain (peripheral nervous system).  The sensory nerves in your brain or spinal cord (central nervous system).  Neuropathic pain can make you more sensitive to pain. What would be a minor sensation for most people may feel very painful if you have neuropathic pain. This is usually a long-term condition that can be difficult to treat. The type of pain can differ from person to person. It may start suddenly (acute), or it may develop slowly and last for a long time (chronic). Neuropathic pain may come and go as damaged nerves heal or may stay at the same level for years. It often causes emotional distress, loss of sleep, and a lower quality of life. What are the causes? The most common cause of damage to a sensory nerve is diabetes. Many other diseases and conditions can also cause neuropathic pain. Causes of neuropathic pain can be classified as:  Toxic. Many drugs and chemicals can cause toxic  damage. The most common cause of toxic neuropathic pain is damage from drug treatment for cancer (chemotherapy).  Metabolic. This type of pain can happen when a disease causes imbalances that damage nerves. Diabetes is the most common of these diseases. Vitamin B deficiency caused by long-term alcohol abuse is another common cause.  Traumatic. Any injury that cuts, crushes, or stretches a nerve can cause damage and pain. A common example is feeling pain after losing an arm or leg (phantom limb pain).  Compression-related. If a sensory nerve gets trapped or compressed for a long period of time, the blood supply to the nerve can be cut off.  Vascular. Many blood vessel diseases can cause neuropathic pain by decreasing blood supply and oxygen to nerves.  Autoimmune. This type of pain results from diseases in which the body's defense system mistakenly attacks sensory nerves. Examples of autoimmune diseases that can cause neuropathic pain include lupus and multiple sclerosis.  Infectious. Many types of viral infections can damage sensory nerves and cause pain. Shingles infection is a common cause of this type of pain.  Inherited. Neuropathic pain can be a symptom of many diseases that are passed down through families (genetic).  What are the signs or symptoms? The main symptom is pain. Neuropathic pain is often described as:  Burning.  Shock-like.  Stinging.  Hot or cold.  Itching.  How is this diagnosed? No single test can diagnose neuropathic pain. Your health care provider will do a physical exam and ask you about your pain.  You may use a pain scale to describe how bad your pain is. You may also have tests to see if you have a high sensitivity to pain and to help find the cause and location of any sensory nerve damage. These tests may include:  Imaging studies, such as: ? X-rays. ? CT scan. ? MRI.  Nerve conduction studies to test how well nerve signals travel through your sensory  nerves (electrodiagnostic testing).  Stimulating your sensory nerves through electrodes on your skin and measuring the response in your spinal cord and brain (somatosensory evoked potentials).  How is this treated? Treatment for neuropathic pain may change over time. You may need to try different treatment options or a combination of treatments. Some options include:  Over-the-counter pain relievers.  Prescription medicines. Some medicines used to treat other conditions may also help neuropathic pain. These include medicines to: ? Control seizures (anticonvulsants). ? Relieve depression (antidepressants).  Prescription-strength pain relievers (narcotics). These are usually used when other pain relievers do not help.  Transcutaneous nerve stimulation (TENS). This uses electrical currents to block painful nerve signals. The treatment is painless.  Topical and local anesthetics. These are medicines that numb the nerves. They can be injected as a nerve block or applied to the skin.  Alternative treatments, such as: ? Acupuncture. ? Meditation. ? Massage. ? Physical therapy. ? Pain management programs. ? Counseling.  Follow these instructions at home:  Learn as much as you can about your condition.  Take medicines only as directed by your health care provider.  Work closely with all your health care providers to find what works best for you.  Have a good support system at home.  Consider joining a chronic pain support group. Contact a health care provider if:  Your pain treatments are not helping.  You are having side effects from your medicines.  You are struggling with fatigue, mood changes, depression, or anxiety. This information is not intended to replace advice given to you by your health care provider. Make sure you discuss any questions you have with your health care provider. Document Released: 09/28/2003 Document Revised: 07/21/2015 Document Reviewed:  06/10/2013 Elsevier Interactive Patient Education  [employer].

## 2017-04-04 LAB — PROTEIN ELECTROPHORESIS, URINE REFLEX
ALBUMIN ELP UR: 23.6 %
ALPHA-1-GLOBULIN, U: 2.4 %
ALPHA-2-GLOBULIN, U: 11.4 %
Beta Globulin, U: 45.2 %
Gamma Globulin, U: 17.5 %
Protein, Ur: 22.4 mg/dL

## 2017-04-07 ENCOUNTER — Ambulatory Visit (INDEPENDENT_AMBULATORY_CARE_PROVIDER_SITE_OTHER): Payer: Medicare Other | Admitting: Neurology

## 2017-04-07 ENCOUNTER — Encounter: Payer: Self-pay | Admitting: Neurology

## 2017-04-07 VITALS — BP 94/54 | HR 88 | Resp 16 | Ht 66.0 in | Wt 117.8 lb

## 2017-04-07 DIAGNOSIS — G44229 Chronic tension-type headache, not intractable: Secondary | ICD-10-CM | POA: Diagnosis not present

## 2017-04-07 DIAGNOSIS — G629 Polyneuropathy, unspecified: Secondary | ICD-10-CM

## 2017-04-07 LAB — HCV INTERPRETATION

## 2017-04-07 LAB — MULTIPLE MYELOMA PANEL, SERUM
Albumin SerPl Elph-Mcnc: 3.8 g/dL (ref 2.9–4.4)
Albumin/Glob SerPl: 1.5 (ref 0.7–1.7)
Alpha 1: 0.1 g/dL (ref 0.0–0.4)
Alpha2 Glob SerPl Elph-Mcnc: 0.6 g/dL (ref 0.4–1.0)
B-Globulin SerPl Elph-Mcnc: 0.9 g/dL (ref 0.7–1.3)
Gamma Glob SerPl Elph-Mcnc: 0.9 g/dL (ref 0.4–1.8)
Globulin, Total: 2.6 g/dL (ref 2.2–3.9)
IGA/IMMUNOGLOBULIN A, SERUM: 143 mg/dL (ref 87–352)
IGG (IMMUNOGLOBIN G), SERUM: 884 mg/dL (ref 700–1600)
IGM (IMMUNOGLOBULIN M), SRM: 53 mg/dL (ref 26–217)
TOTAL PROTEIN: 6.4 g/dL (ref 6.0–8.5)

## 2017-04-07 LAB — HEPATITIS B SURFACE ANTIBODY, QUANTITATIVE

## 2017-04-07 LAB — HEPATITIS B SURFACE ANTIGEN: Hepatitis B Surface Ag: NEGATIVE

## 2017-04-07 LAB — HIV ANTIBODY (ROUTINE TESTING W REFLEX): HIV SCREEN 4TH GENERATION: NONREACTIVE

## 2017-04-07 LAB — HCV AB W/RFLX TO VERIFICATION

## 2017-04-07 LAB — SEDIMENTATION RATE: SED RATE: 2 mm/h (ref 0–40)

## 2017-04-07 LAB — VITAMIN B1: Thiamine: 146.1 nmol/L (ref 66.5–200.0)

## 2017-04-07 NOTE — Patient Instructions (Signed)
1.  Continue Cymbalta for now, since it treats nerve pain and headache 2.  Limit Excedrin to no more than 2 days out of week 3.  Stop coffee 4.  I will write letter for you 5.  I will refer you to [facility] to evaluate the neuropathy 6.  Follow up in 3 months.

## 2017-04-07 NOTE — Progress Notes (Signed)
NEUROLOGY FOLLOW UP OFFICE NOTE  Kayla [name] [id number]  HISTORY OF PRESENT ILLNESS: Kayla [name] is a 72 year old woman with fibromyalgia, anxiety, degenerative disc disease of cervical and lumbar spine, and history of nephrolithiasis and gastric bypass (1997) who follows up for idiopathic polyneuropathy.  UPDATE: NCV-EMG from 10/21/16 demonstrated chronic sensorimotor polyneuropathy of the predominantly axonal type as well as mild left median neuropathy at or distal to the wrist.  Labs since last visit:  B1 146.1, B6 10.7, Sed Rate 2, HIV negative, TSH 1.780, UPEP negative.  She is prescribed Lyrica 50mg  twice daily which helps.  She was recently started on Cymbalta 30mg  twice daily.  She feels that there neuropathy has gotten worse over the past several weeks.  It has spread up her legs.  She now has to ambulate with assistance.  She also notes tingling in her chest.  She does report some anxiety related to her daughter.  She is wondering if her neuropathy could be secondary to [employer] exposure.  Her husband was a [place] veteran and reports cases of spouses exposed to [employer] through their husband's semen.  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  Her neuropathic symptoms started shortly after her return.  She requests a letter for the [place] recommending biopsy testing for Agent Orange.  Migraines are well controlled.  However, for the past 6 weeks she reports a constant moderate non-throbbing headache across her temples and forehead.  She has been taking Excedrin twice daily.  For fibromyalgia, she takes Fentanyl patch.  Sleep is poor.  She drinks 1 cup of coffee daily.  HISTORY: Since July 2018, she has had increased swelling and burning numbness and tingling in the legs.  She had lower extremity vascular ABIs on 08/20/16, which was negative.  She has history of B12 deficiency but she takes injections and recent B12 level from 09/21/16 was over 2000.   Methylmalonic acid level was 209, RPR nonreactive, homocysteine 7.8.  Labs from 07/12/16 include normal SPEP, Sed Rate 2, CRP less than 0.3, and TSH 2.430 .  Vitamin D was 23 and she was advised to start supplementation.  Serum glucose has been 70s up to 110.  She also takes B6.  She also has history of weight loss.  She has fibromyalgia and was previously diagnosed with neuropathy by another neurologist.  She has been on gabapentin for many years.  It was briefly discontinued to see if it was contributing to the swelling.  She reported no significant change, so it was restarted (she takes 300mg  4 times daily), although she thinks it helped a little bit.  PAST MEDICAL HISTORY: Past Medical History:  Diagnosis Date  . Allergy   . Anemia    anemia in the past  . Angina    takes Propanolol prn and it stops her chest  . Anxiety   . Barrett esophagus    not consistently present  . Cataract   . Chronic kidney disease    kidney stones and UTI  . Complication of anesthesia    either  BP or pulse dropped with last surgery  . DDD (degenerative disc disease)    cervical and lumbar  . Dementia   . Depression    post traumatic stress  disorder  . Diabetes mellitus    sugar goes up and down diet controlled  . Dysrhythmia    brought on by stress  . External hemorrhoids   . Fatigue   .  Fibromyalgia    neuropathy knees ankles and toes, bladder  . GERD (gastroesophageal reflux disease)   . H/O hyperparathyroidism   . Headache(784.0)    migraines  . Hiatal hernia   . History of kidney stones   . Hypertension    3 small brain aneurysms  . IBS (irritable bowel syndrome)   . Internal hemorrhoids   . Macular degeneration   . Migraines   . Osteoarthritis    all over  . Osteopetrosis   . Peripheral vascular disease (HCC)    superficial phlebitis in left calf  . Personal history of colonic polyps 07/22/2013   07/2013 - 3 small adenomas - repeat colonoscopy 2018    MEDICATIONS: Current  Outpatient Medications on File Prior to Visit  Medication Sig Dispense Refill  . acarbose (PRECOSE) 25 MG tablet Take 1 tablet (25 mg total) by mouth 3 (three) times daily with meals. 90 tablet 5  . Ascorbic Acid (VITAMIN C) 1000 MG tablet Take 1,000 mg by mouth daily.    Marland Kitchen aspirin-acetaminophen-caffeine (EXCEDRIN MIGRAINE) 250-250-65 MG tablet Take 2 tablets by mouth 2 (two) times daily.    . beta carotene w/minerals (OCUVITE) tablet Take 1 tablet by mouth 2 (two) times daily.    Marland Kitchen CALCIUM-MAGNESIUM-ZINC PO Take by mouth daily.    . Cholecalciferol (VITAMIN D3) 2000 UNITS TABS Take 2,000 Units by mouth daily.    . clonazePAM (KLONOPIN) 1 MG tablet Take 0.5-1 tablets (0.5-1 mg total) by mouth 2 (two) times daily as needed for anxiety. 180 tablet 1  . Continuous Blood Gluc Receiver (FREESTYLE LIBRE 14 DAY READER) DEVI 1 Device every 14 (fourteen) days by Does not apply route. 6 Device 3  . Cyanocobalamin (VITAMIN B-12) 1000 MCG SUBL Place 5,000 mcg under the tongue daily.     . cyclobenzaprine (FLEXERIL) 10 MG tablet Take 1 tablet (10 mg total) by mouth 3 (three) times daily as needed for muscle spasms. 270 tablet 1  . cycloSPORINE (RESTASIS) 0.05 % ophthalmic emulsion Place 1 drop into both eyes 2 (two) times daily. 5.5 mL 5  . diclofenac sodium (VOLTAREN) 1 % GEL Apply 2 g topically 4 (four) times daily. 700 g 3  . dicyclomine (BENTYL) 10 MG capsule Take 1-2 tab 3 times daily AC as needed for spasms and cramping. 270 capsule 1  . diltiazem 2 % GEL Apply 1 application topically 2 (two) times daily as needed (pain from fissure). 30 g 2  . docusate sodium (COLACE) 250 MG capsule Take 1 capsule (250 mg total) by mouth daily. 90 capsule 3  . DULoxetine (CYMBALTA) 30 MG capsule Take 1 capsule (30 mg total) by mouth daily. X 2 weeks, then bid 180 capsule 0  . esomeprazole (NEXIUM) 40 MG capsule Take 1 capsule (40 mg total) by mouth daily before breakfast. 90 capsule 3  . fentaNYL (DURAGESIC - DOSED  MCG/HR) 50 MCG/HR Place 50 mcg onto the skin every 3 (three) days.    . furosemide (LASIX) 40 MG tablet Take 1 tablet (40 mg total) by mouth daily as needed for edema. 90 tablet 0  . glucose blood test strip Use to check cbgs 6 times daily as directed by physician. Testing freq: hypoglycemia, hyperglycemia. ICD10: R73.03, E 16.2 600 each 5  . HYDROcodone-acetaminophen (NORCO) 7.5-325 MG tablet Take 1 tablet every 6 (six) hours as needed by mouth for moderate pain.    . hypromellose (SYSTANE OVERNIGHT THERAPY) 0.3 % GEL ophthalmic ointment Place 1 drop into both eyes at  bedtime. Reported on 02/11/2015    . IRON PO Take by mouth.    Marland Kitchen L-Methylfolate-Algae-B12-B6 (METANX) 3-90.314-2-35 MG CAPS Take 1 capsule 2 (two) times daily by mouth.    . lidocaine (LIDODERM) 5 % Place 1 patch onto the skin as needed. Remove & Discard patch within 12 hours. May dispense as 3 month supply 90 patch 3  . Lifitegrast (XIIDRA) 5 % SOLN Place 1 drop into both eyes daily.     . meloxicam (MOBIC) 15 MG tablet TAKE 1 TABLET BY MOUTH EVERY DAY 30 tablet 0  . metroNIDAZOLE (METROGEL) 1 % gel Apply topically daily. 45 g 0  . mirabegron ER (MYRBETRIQ) 25 MG TB24 tablet Take 1 tablet (25 mg total) by mouth daily. 90 tablet 3  . mupirocin ointment (BACTROBAN) 2 % Apply 1 application topically 3 (three) times daily. 30 g 1  . Naloxone HCl 0.4 MG/0.4ML SOAJ Administer 0.4mg  [place] at first sign of opioid overdose and repeat every 2 minutes as needed for resuscitation. Call 911 immediately 5 Package 0  . NON FORMULARY [facility]  Peripheral Neuropathy Cream- Bupivacaine 1%, Doxepin 3%, Gabapentin 6%, Pentoxifylline 3%, Topiramate 1% Apply 1-2 grams to affected area 3-4 times daily Qty. 120 gm 3 refills    . ondansetron (ZOFRAN) 8 MG tablet Take 1 tablet (8 mg total) by mouth every 8 (eight) hours as needed for nausea or vomiting. 60 tablet 0  . ONE TOUCH LANCETS MISC Use to check cbgs 6 times daily as directed by physician.  Testing freq: hypoglycemia, hyperglycemia. ICD10: R73.03, E 16.2 600 each 5  . potassium chloride SA (K-DUR,KLOR-CON) 20 MEQ tablet Take 1 tablet (20 mEq total) by mouth daily as needed (only take along with the furosemide). 90 tablet 0  . pregabalin (LYRICA) 50 MG capsule Take 1 capsule (50 mg total) by mouth 3 (three) times daily. 90 capsule 3  . Probiotic Product (PROBIOTIC-10 PO) Take by mouth.    . pyridoxine (B-6) 200 MG tablet Take 200 mg by mouth daily.    . SUMAtriptan (IMITREX) 100 MG tablet Take 1 tablet at earliest onset of headache, may repeat in 2 hours if headache persists or reoccurs. (Patient taking differently: Take 100 mg by mouth every 3 (three) days. ) 30 tablet 1  . traZODone (DESYREL) 100 MG tablet Take 0.5-1 tablets (50-100 mg total) by mouth at bedtime as needed for sleep. And 1/2 -1 if wakes at 4am. 90 tablet 1  . valACYclovir (VALTREX) 1000 MG tablet Take 1 tablet (1,000 mg total) by mouth 3 (three) times daily. 21 tablet 0  . Vitamin D, Ergocalciferol, (DRISDOL) 50000 units CAPS capsule Take 1 capsule (50,000 Units total) by mouth every 7 (seven) days. 12 capsule 0  . [DISCONTINUED] buPROPion (WELLBUTRIN) 75 MG tablet Take 75 mg by mouth daily after breakfast.      No current facility-administered medications on file prior to visit.     ALLERGIES: Allergies  Allergen Reactions  . Ambien [Zolpidem Tartrate]     Hallucinations  . Codeine Anaphylaxis  . Oxycodone-Acetaminophen Shortness Of Breath  . Tylox [Oxycodone-Acetaminophen] Anaphylaxis    Chest pain  . Darvocet [Propoxyphene N-Acetaminophen]     Chest pain  . Darvon [Propoxyphene] Itching  . Food Swelling    wheat-  . Lorcet [Hydrocodone-Acetaminophen]     Chest pain  . Opium     hallucinations  . Vicodin [Hydrocodone-Acetaminophen] Other (See Comments)    Chest Pain   . Wheat Bran   .  Amoxicillin Nausea And Vomiting    Reaction:  Migraine headache  . Penicillins Nausea And Vomiting    Migraine  Headaches    FAMILY HISTORY: Family History  Problem Relation Age of Onset  . Stroke Mother   . Lung cancer Mother   . Heart disease Mother   . Diabetes Mother   . Hypertension Father   . Stroke Father   . Lung cancer Father   . Heart disease Father   . Kidney disease Father   . Seizures Father        epilepsy, and sisters x 2  . Pancreatic cancer Sister   . Lung cancer Sister   . Colon cancer Maternal Aunt        12 relatives  . Uterine cancer Unknown        aunt  . Heart disease Unknown        grandmother  . Clotting disorder Sister   . Heart disease Sister        x 2  . Kidney disease Sister        x 2  . Breast cancer Sister   . Colon cancer Paternal Aunt   . Colon cancer Paternal Aunt     SOCIAL HISTORY: Social History   Socioeconomic History  . Marital status: Married    Spouse name: Rushie Chestnut.  . Number of children: 2  . Years of education: Not on file  . Highest education level: Not on file  Occupational History  . Occupation: Disability    Employer: RETIRED  Social Needs  . Financial resource strain: Not on file  . Food insecurity:    Worry: Not on file    Inability: Not on file  . Transportation needs:    Medical: Not on file    Non-medical: Not on file  Tobacco Use  . Smoking status: Never Smoker  . Smokeless tobacco: Never Used  Substance and Sexual Activity  . Alcohol use: No  . Drug use: No  . Sexual activity: Not Currently  Lifestyle  . Physical activity:    Days per week: Not on file    Minutes per session: Not on file  . Stress: Not on file  Relationships  . Social connections:    Talks on phone: Not on file    Gets together: Not on file    Attends religious service: Not on file    Active member of club or organization: Not on file    Attends meetings of clubs or organizations: Not on file    Relationship status: Not on file  . Intimate partner violence:    Fear of current or ex partner: Not on file    Emotionally abused: Not  on file    Physically abused: Not on file    Forced sexual activity: Not on file  Other Topics Concern  . Not on file  Social History Narrative   She retired on disability   Married   2 Children    REVIEW OF SYSTEMS: Constitutional: No fevers, chills, or sweats, no generalized fatigue, change in appetite Eyes: No visual changes, double vision, eye pain Ear, nose and throat: No hearing loss, ear pain, nasal congestion, sore throat Cardiovascular: No chest pain, palpitations Respiratory:  No shortness of breath at rest or with exertion, wheezes GastrointestinaI: No nausea, vomiting, diarrhea, abdominal pain, fecal incontinence Genitourinary:  No dysuria, urinary retention or frequency Musculoskeletal:  No neck pain, back pain Integumentary: No rash, pruritus, skin lesions Neurological: as  above Psychiatric: No depression, insomnia, anxiety Endocrine: No palpitations, fatigue, diaphoresis, mood swings, change in appetite, change in weight, increased thirst Hematologic/Lymphatic:  No purpura, petechiae. Allergic/Immunologic: no itchy/runny eyes, nasal congestion, recent allergic reactions, rashes  PHYSICAL EXAM: Vitals:   04/07/17 1057  BP: (!) 94/54  Pulse: 88  Resp: 16  SpO2: 97%   General: No acute distress.  Patient appears well-groomed.   Head:  Normocephalic/atraumatic Eyes:  Fundi examined but not visualized Neck: supple, no paraspinal tenderness, full range of motion Heart:  Regular rate and rhythm Lungs:  Clear to auscultation bilaterally Back: No paraspinal tenderness Neurological Exam: alert and oriented to person, place, and time. Attention span and concentration intact, recent and remote memory intact, fund of knowledge intact.  Speech fluent and not dysarthric, language intact.  CN II-XII intact. Bulk and tone normal, muscle strength 5/5 throughout.  Sensation to pinprick intact, vibration sensation reduced in feet up to ankles.  Deep tendon reflexes 2+ throughout,  toes downgoing.  Finger to nose and heel to shin testing intact.  Gait normal, Romberg negative.  IMPRESSION: 1.  Idiopathic polyneuropathy.  Unlikely secondary to Agent Orange exposure via her husband, as Agent Orange-related neuropathy develops within a year of exposure.  She reports onset of symptoms shortly after trip to [place] in the late 90s.  I am not familiar enough with Agent Orange to know if exposure is still possible decades later.  If it is possible, then neuropathy may be due to [employer].  However, I don't think  2.  Chronic tension-type headache/medication-overuse  PLAN: 1.  Continue Cymbalta for now, since it treats nerve pain and headache 2.  Limit Excedrin to no more than 2 days out of week 3.  Stop coffee 4.  I consent to her request to provide letter recommending testing by [facility] for Agent Orange 5.  I will refer to [facility] to evaluate the neuropathy 6.  Follow up in 3 months.  25 minutes spent face to face with patient, over 50% spent discussing management.  Metta Clines, DO  CC:  Dr. Brigitte Pulse

## 2017-04-08 DIAGNOSIS — M47812 Spondylosis without myelopathy or radiculopathy, cervical region: Secondary | ICD-10-CM | POA: Diagnosis not present

## 2017-04-08 DIAGNOSIS — R51 Headache: Secondary | ICD-10-CM | POA: Diagnosis not present

## 2017-04-08 DIAGNOSIS — M170 Bilateral primary osteoarthritis of knee: Secondary | ICD-10-CM | POA: Diagnosis not present

## 2017-04-08 DIAGNOSIS — G894 Chronic pain syndrome: Secondary | ICD-10-CM | POA: Diagnosis not present

## 2017-04-08 DIAGNOSIS — I739 Peripheral vascular disease, unspecified: Secondary | ICD-10-CM | POA: Diagnosis not present

## 2017-04-08 DIAGNOSIS — G629 Polyneuropathy, unspecified: Secondary | ICD-10-CM | POA: Diagnosis not present

## 2017-04-09 NOTE — Addendum Note (Signed)
Addended by: Shawnee Knapp on: 04/09/2017 05:19 PM   Modules accepted: Orders

## 2017-04-10 ENCOUNTER — Other Ambulatory Visit: Payer: Self-pay | Admitting: Family Medicine

## 2017-04-10 ENCOUNTER — Telehealth: Payer: Self-pay | Admitting: Neurology

## 2017-04-10 DIAGNOSIS — Z1231 Encounter for screening mammogram for malignant neoplasm of breast: Secondary | ICD-10-CM

## 2017-04-10 DIAGNOSIS — G43709 Chronic migraine without aura, not intractable, without status migrainosus: Secondary | ICD-10-CM

## 2017-04-10 NOTE — Telephone Encounter (Signed)
Patient called and wanted to ask if Dr. Tomi Likens is familiar with CEFALY Electrodes? She said a friend is using one that he received from the [place] for headaches. She used one yesterday for a really bad headache and she said it was gone within 5 Minutes. She said the patch is placed on the forehead. Please Call. Thanks

## 2017-04-10 NOTE — Telephone Encounter (Signed)
Pt wants Rx sent electronically to [facility]

## 2017-04-11 ENCOUNTER — Telehealth: Payer: Self-pay

## 2017-04-11 ENCOUNTER — Telehealth: Payer: Self-pay | Admitting: Family Medicine

## 2017-04-11 MED ORDER — CEFALY KIT DEVI: 20.0000 min | 0 refills | Status: DC

## 2017-04-11 NOTE — Telephone Encounter (Signed)
Spoke with Pt, advsd her of web-site, Pt requests dual unit and to send to [place]. Sent Rx electronically.

## 2017-04-11 NOTE — Telephone Encounter (Signed)
I called Kayla [name] from [facility] back. She states that Kayla [name] declined home services and stated that her daughter was going to help her with the house work. Kayla [name] also stated that Kayla [name] said that she has a walker and wheelchair and she doesn't need further help and that if she does she will call [facility] at a later date if she requires more assistance. Please also see other telephone messages regarding [name].

## 2017-04-11 NOTE — Telephone Encounter (Signed)
Copied from [place] [id number]. Topic: Quick Communication - See Telephone Encounter >> Apr 11, 2017 11:01 AM Ahmed Prima L wrote: CRM for notification. See Telephone encounter for: 04/11/17.  Pt said her friend bought some vitamins at the health store and wants to know if they are ok to take if they with her with her neuropathy   Borage Gamma Linoleic acid 240mg   Alpha lipoic acid with biotin 260mg   Mega B-Stress with vitamin C long lasting veggie caps  Please call pt [phone]

## 2017-04-11 NOTE — Telephone Encounter (Signed)
Call to patient-she is using Freestyle Freedom Lite. She needs strips and lancets sent in for that. Call to her pharmacy and they can not change the order over the phone- they will need new Rx for both.  Please send in new Rx for supplies for patient.

## 2017-04-11 NOTE — Telephone Encounter (Signed)
Pt also made aware.

## 2017-04-11 NOTE — Telephone Encounter (Signed)
Copied from [place]. Topic: Inquiry >> Apr 11, 2017 11:06 AM Kayla [name] wrote: Reason for CRM: Pt said that Dr Brigitte Pulse was going to write her a letter to [employer] [facility] in regard to testing agent orange exposure. She is just following up on this. Please advise.

## 2017-04-11 NOTE — Telephone Encounter (Signed)
I don't know anything about the first one but the other 2 should be fine.  However, I would imaging the she is already getting more than sufficient doses of the super-B complex and C in her other supps so not sure there would be any point of adding in the last one but it likely wouldn't cause harm.

## 2017-04-11 NOTE — Telephone Encounter (Signed)
[place] they are correcting the existing Rx written on 11/08/16 by Brigitte Pulse to reflect Freestyle Freedom Lite.

## 2017-04-11 NOTE — Telephone Encounter (Signed)
She can try the Cefaly.  We can send her the information/website.  There is one indicated for treating acute attacks, one used daily as a preventative or there is one for both.  She should know that it is an out of pocket expense, however I believe there is a refund if not satisfied after 60 to 90 days (not sure exactly).

## 2017-04-11 NOTE — Telephone Encounter (Signed)
Sounds good

## 2017-04-11 NOTE — Telephone Encounter (Signed)
Message re: vitamins sent to Dr. Brigitte Pulse

## 2017-04-11 NOTE — Telephone Encounter (Signed)
Copied from [place]. Topic: Quick Communication - See Telephone Encounter >> Apr 11, 2017  1:36 PM Boyd Kerbs wrote: CRM for notification.   [facility] [phone] regarding referral. Patient has declined services.  See Telephone encounter for: 04/11/17.

## 2017-04-11 NOTE — Telephone Encounter (Signed)
Copied from [place] [phone]. Topic: Quick Communication - See Telephone Encounter >> Apr 11, 2017 11:07 AM Ahmed Prima L wrote: CRM for notification. See Telephone encounter for: 04/11/17.  Pt needs the correct test strips sent to [facility] - [place], [place] - [address]  Freestyle test strips

## 2017-04-12 NOTE — Telephone Encounter (Signed)
Spoke with pt, gave message per Dr. Brigitte Pulse re: vitamins

## 2017-04-15 ENCOUNTER — Ambulatory Visit: Payer: Self-pay | Admitting: Neurology

## 2017-04-16 ENCOUNTER — Ambulatory Visit
Admission: RE | Admit: 2017-04-16 | Discharge: 2017-04-16 | Disposition: A | Discharge status: Home / Self Care | Payer: Medicare Other | Source: Ambulatory Visit | Attending: Family Medicine | Admitting: Family Medicine

## 2017-04-16 DIAGNOSIS — G609 Hereditary and idiopathic neuropathy, unspecified: Secondary | ICD-10-CM

## 2017-04-16 DIAGNOSIS — M5030 Other cervical disc degeneration, unspecified cervical region: Secondary | ICD-10-CM

## 2017-04-16 DIAGNOSIS — M5137 Other intervertebral disc degeneration, lumbosacral region: Secondary | ICD-10-CM

## 2017-04-16 DIAGNOSIS — M542 Cervicalgia: Secondary | ICD-10-CM

## 2017-04-16 DIAGNOSIS — M4802 Spinal stenosis, cervical region: Secondary | ICD-10-CM | POA: Diagnosis not present

## 2017-04-16 DIAGNOSIS — G8929 Other chronic pain: Secondary | ICD-10-CM

## 2017-04-16 DIAGNOSIS — G5793 Unspecified mononeuropathy of bilateral lower limbs: Secondary | ICD-10-CM

## 2017-04-16 DIAGNOSIS — M4726 Other spondylosis with radiculopathy, lumbar region: Secondary | ICD-10-CM | POA: Diagnosis not present

## 2017-04-16 DIAGNOSIS — M545 Low back pain: Secondary | ICD-10-CM

## 2017-04-16 DIAGNOSIS — M47817 Spondylosis without myelopathy or radiculopathy, lumbosacral region: Secondary | ICD-10-CM | POA: Diagnosis not present

## 2017-04-21 ENCOUNTER — Ambulatory Visit (INDEPENDENT_AMBULATORY_CARE_PROVIDER_SITE_OTHER): Payer: Medicare Other | Admitting: Family Medicine

## 2017-04-21 ENCOUNTER — Encounter: Payer: Self-pay | Admitting: Family Medicine

## 2017-04-21 ENCOUNTER — Other Ambulatory Visit: Payer: Self-pay

## 2017-04-21 VITALS — BP 96/56 | HR 92 | Temp 98.0°F | Ht 65.0 in | Wt 116.2 lb

## 2017-04-21 DIAGNOSIS — R9389 Abnormal findings on diagnostic imaging of other specified body structures: Secondary | ICD-10-CM

## 2017-04-21 DIAGNOSIS — G603 Idiopathic progressive neuropathy: Secondary | ICD-10-CM | POA: Diagnosis not present

## 2017-04-21 DIAGNOSIS — Z23 Encounter for immunization: Secondary | ICD-10-CM | POA: Diagnosis not present

## 2017-04-21 MED ORDER — ALBUTEROL SULFATE (2.5 MG/3ML) 0.083% IN NEBU
2.5000 mg | INHALATION_SOLUTION | Freq: Once | RESPIRATORY_TRACT | Status: AC
  Administered 2017-04-21: 2.5 mg via RESPIRATORY_TRACT

## 2017-04-21 NOTE — Progress Notes (Signed)
Subjective:  By signing my name below, I, Kayla [name], attest that this documentation has been prepared under the direction and in the presence of Kayla Cheadle, MD. Electronically Signed: Moises [name], Kayla [name]. 04/21/2017 , 2:23 PM .  Patient was seen in Room 2 .   Patient ID: Kayla [name], female    DOB: [birth year], 72 y.o.   MRN: [id number] Chief Complaint  Patient presents with  . Results    Lab results and MRI results   HPI Kayla [name] is a 72 y.o. female who presents to Primary Care at [facility] for follow up and discussion of lab and MRI results. Patient was seen about 2.5 weeks ago, and did an extensive work up for causes of neuropathy with no abnormalities observed in any testing. Of note, she was not immune to Hep B. She also had MRI's of her lumbar and cervical spine 5 days prior, and both showed limited areas of mild vertebral degeneration and some wear and tear changes, but no cord or nerve impingement to explain her neuropathy. She had NCV-EMG which showed chronic sensorimotor polyneuropathy of the predominantly axonal type. She has been improving on Lyrica 67m BID and recently started on Cymbalta 367mBID. Patient saw neurologist, Dr. JaTomi Likens2 weeks prior who noted polyneuropathy idiopathic but unlikely secondary to Agent Orange. Recommended she continue Cymbalta to treat nerve pain [facility]. She was also prescribed CEFALY to treat migraines, per patient request. She also saw her pain management provider, Kayla [name], who continued patient on fentanyl 5073mith hydrocodone 7.5mg64m0 PRN.   Patient states [facility] [place] [facility] [place] [facility] Her friend used it on the patient and her headache resolved within a few minutes.   Patient's last echo was in 2016, which showed normal atrium size. Last EKG done in office on 08/19/16, showed normal atrium size as well. She has an appointment with cardiology, Dr.  NahsAcie [name] a few weeks. She was exposed to second-hand smoking when she was younger when living with her parents and 2 sisters who, she notes, were "3 pack a day smokers".   She agrees to Hep B immunization today.   Past Medical History:  Diagnosis Date  . Allergy   . Anemia    anemia in the past  . Angina    takes Propanolol prn and it stops her chest  . Anxiety   . Barrett esophagus    not consistently present  . Cataract   . Chronic kidney disease    kidney stones and UTI  . Complication of anesthesia    either  BP or pulse dropped with last surgery  . DDD (degenerative disc disease)    cervical and lumbar  . Dementia   . Depression    post traumatic stress  disorder  . Diabetes mellitus    sugar goes up and down diet controlled  . Dysrhythmia    brought on by stress  . External hemorrhoids   . Fatigue   . Fibromyalgia    neuropathy knees ankles and toes, bladder  . GERD (gastroesophageal reflux disease)   . H/O hyperparathyroidism   . Headache(784.0)    migraines  . Hiatal hernia   . History of kidney stones   . Hypertension    3 small brain aneurysms  . IBS (irritable bowel syndrome)   . Internal hemorrhoids   . Macular degeneration   . Migraines   .  Osteoarthritis    all over  . Osteopetrosis   . Peripheral vascular disease (HCC)    superficial phlebitis in left calf  . Personal history of colonic polyps 07/22/2013   07/2013 - 3 small adenomas - repeat colonoscopy 2018   Past Surgical History:  Procedure Laterality Date  . ABDOMINAL HYSTERECTOMY    . APPENDECTOMY    . CARDIAC CATHETERIZATION  03/22/1991   EF 61%  . CARDIOVASCULAR STRESS TEST  10/03/2006  . CHOLECYSTECTOMY    . COLONOSCOPY  06/23/2008   normal  . DILATION AND CURETTAGE OF UTERUS     x2  . ESOPHAGUS SURGERY    . EXPLORATORY LAPAROTOMY  1993  . GASTRIC BYPASS  1999  . GASTRIC RESTRICTION SURGERY  1991  . Right Arm Surgery    . Right Knee Arthroscopy    . Rt wrist fx  2009  . stomach  stappeling  1991  . STONE EXTRACTION WITH BASKET  2012  . TONSILLECTOMY    . TUBAL LIGATION    . UPPER GASTROINTESTINAL ENDOSCOPY  06/23/2008   w/Dil, Barrett's esophagus  . US ECHOCARDIOGRAPHY  01/21/2007   EF 55-60%  . US ECHOCARDIOGRAPHY  08/30/2003   EF 55-60%   Prior to Admission medications   Medication Sig Start Date End Date Taking? Authorizing Provider  acarbose (PRECOSE) 25 MG tablet Take 1 tablet (25 mg total) by mouth 3 (three) times daily with meals. 11/07/16   Renato Shin, MD  Ascorbic Acid (VITAMIN C) 1000 MG tablet Take 1,000 mg by mouth daily.    [provider]  aspirin-acetaminophen-caffeine (EXCEDRIN MIGRAINE) [phone] MG tablet Take 2 tablets by mouth 2 (two) times daily.    [provider]  beta carotene w/minerals (OCUVITE) tablet Take 1 tablet by mouth 2 (two) times daily.    [provider]  Cholecalciferol (VITAMIN D3) 2000 UNITS TABS Take 2,000 Units by mouth daily.    [provider]  clonazePAM (KLONOPIN) 1 MG tablet Take 0.5-1 tablets (0.5-1 mg total) by mouth 2 (two) times daily as needed for anxiety. 03/29/17   Shawnee Knapp, MD  Continuous [name] Gluc Receiver (FREESTYLE LIBRE 14 DAY READER) DEVI 1 Device every 14 (fourteen) days by Does not apply route. 12/01/16   Renato Shin, MD  Cyanocobalamin (VITAMIN B-12) 1000 MCG SUBL Place 5,000 mcg under the tongue daily.     [provider]  cyclobenzaprine (FLEXERIL) 10 MG tablet Take 1 tablet (10 mg total) by mouth 3 (three) times daily as needed for muscle spasms. Patient not taking: Reported on 04/09/2017 03/29/17   Shawnee Knapp, MD  cycloSPORINE (RESTASIS) 0.05 % ophthalmic emulsion Place 1 drop into both eyes 2 (two) times daily. 11/08/16   Shawnee Knapp, MD  diclofenac sodium (VOLTAREN) 1 % GEL Apply 2 g topically 4 (four) times daily. 02/12/17   Shawnee Knapp, MD  dicyclomine (BENTYL) 10 MG capsule Take 1-2 tab 3 times daily AC as needed for spasms and cramping. 11/08/16    Shawnee Knapp, MD  diltiazem 2 % GEL Apply 1 application topically 2 (two) times daily as needed (pain from fissure). 10/05/16   Shawnee Knapp, MD  docusate sodium (COLACE) 250 MG capsule Take 1 capsule (250 mg total) by mouth daily. 11/08/16   Shawnee Knapp, MD  DULoxetine (CYMBALTA) 30 MG capsule Take 1 capsule (30 mg total) by mouth daily. X 2 weeks, then bid 03/31/17   Shawnee Knapp, MD  esomeprazole ([place]) 40  MG capsule Take 1 capsule (40 mg total) by mouth daily before breakfast. 11/08/16   Shawnee Knapp, MD  fentaNYL (DURAGESIC - DOSED MCG/HR) 50 MCG/HR Place 50 mcg onto the skin every 3 (three) days.    [provider]  furosemide (LASIX) 40 MG tablet Take 1 tablet (40 mg total) by mouth daily as needed for edema. Patient not taking: Reported on 04/09/2017 03/29/17   Shawnee Knapp, MD  glucose [name] test strip Use to check cbgs 6 times daily as directed by physician. Testing freq: hypoglycemia, hyperglycemia. ICD10: R73.03, E 16.2 11/08/16   Shawnee Knapp, MD  HYDROcodone-acetaminophen (NORCO) 7.5-325 MG tablet Take 1 tablet every 6 (six) hours as needed by mouth for moderate pain.    [provider]  hypromellose (SYSTANE OVERNIGHT THERAPY) 0.3 % GEL ophthalmic ointment Place 1 drop into both eyes at bedtime. Reported on 02/11/2015    [provider]  IRON PO Take by mouth.    [provider]  L-Methylfolate-Algae-B12-B6 Glade Stanford) [phone] MG CAPS Take 1 capsule 2 (two) times daily by mouth.    [provider]  lidocaine (LIDODERM) 5 % Place 1 patch onto the skin as needed. Remove & Discard patch within 12 hours. May dispense as 3 month supply 11/08/16   Shawnee Knapp, MD  Lifitegrast Shirley Friar) 5 % SOLN Place 1 drop into both eyes daily.     [provider]  meloxicam (MOBIC) 15 MG tablet TAKE 1 TABLET BY MOUTH EVERY DAY 01/31/17   Gardiner Barefoot, DPM  metroNIDAZOLE (METROGEL) 1 % gel Apply topically daily. 03/19/17   Shawnee Knapp, MD  mirabegron ER  (MYRBETRIQ) 25 MG TB24 tablet Take 1 tablet (25 mg total) by mouth daily. 11/08/16   Shawnee Knapp, MD  mupirocin ointment (BACTROBAN) 2 % Apply 1 application topically 3 (three) times daily. 10/05/16   Shawnee Knapp, MD  Naloxone HCl 0.4 MG/0.4ML SOAJ Administer 0.21m [place] at first sign of opioid overdose and repeat every 2 minutes as needed for resuscitation. Call 911 immediately 03/20/16   SShawnee Knapp MD  Nerve Stimulator (CEFALY KIT) DEVI 20 Minutes by Does not apply route as directed. 04/11/17   JPieter Partridge DO  NON FORMULARY [facility]  Peripheral Neuropathy Cream- Bupivacaine 1%, Doxepin 3%, Gabapentin 6%, Pentoxifylline 3%, Topiramate 1% Apply 1-2 grams to affected area 3-4 times daily Qty. 120 gm 3 refills    [provider]  ondansetron (ZOFRAN) 8 MG tablet Take 1 tablet (8 mg total) by mouth every 8 (eight) hours as needed for nausea or vomiting. 03/25/17   SShawnee Knapp MD  ONE TOUCH LANCETS MISC Use to check cbgs 6 times daily as directed by physician. Testing freq: hypoglycemia, hyperglycemia. ICD10: R73.03, E 16.2 11/08/16   SShawnee Knapp MD  potassium chloride SA (K-DUR,KLOR-CON) 20 MEQ tablet Take 1 tablet (20 mEq total) by mouth daily as needed (only take along with the furosemide). Patient not taking: Reported on 04/09/2017 03/29/17   SShawnee Knapp MD  pregabalin (LYRICA) 50 MG capsule Take 1 capsule (50 mg total) by mouth 3 (three) times daily. Patient taking differently: Take 50 mg by mouth 2 (two) times daily.  12/26/16   JPieter Partridge DO  Probiotic Product (PROBIOTIC-10 PO) Take by mouth.     [provider]  pyridoxine (B-6) 200 MG tablet Take 200 mg by mouth daily.    [provider]  SUMAtriptan (IMITREX) 100 MG tablet Take  1 tablet at earliest onset of headache, may repeat in 2 hours if headache persists or reoccurs. Patient taking differently: Take 100 mg by mouth every 3 (three) days.  02/26/16   Shawnee Knapp, MD  traZODone (DESYREL) 100 MG tablet  Take 0.5-1 tablets (50-100 mg total) by mouth at bedtime as needed for sleep. And 1/2 -1 if wakes at 4am. 11/08/16   Shawnee Knapp, MD  valACYclovir (VALTREX) 1000 MG tablet Take 1 tablet (1,000 mg total) by mouth 3 (three) times daily. 03/19/17   Shawnee Knapp, MD  Vitamin D, Ergocalciferol, (DRISDOL) 50000 units CAPS capsule Take 1 capsule (50,000 Units total) by mouth every 7 (seven) days. Patient not taking: Reported on 04/09/2017 07/22/16   Bo Merino, MD  buPROPion [facility]) 75 MG tablet Take 75 mg by mouth daily after breakfast.   04/10/11  [provider]   Allergies  Allergen Reactions  . Ambien [Zolpidem Tartrate]     Hallucinations  . Codeine Anaphylaxis  . Oxycodone-Acetaminophen Shortness Of Breath  . Tylox [Oxycodone-Acetaminophen] Anaphylaxis    Chest pain  . Darvocet [Propoxyphene N-Acetaminophen]     Chest pain  . Darvon [Propoxyphene] Itching  . Lorcet [Hydrocodone-Acetaminophen]     Chest pain  . Opium     hallucinations  . Vicodin [Hydrocodone-Acetaminophen] Other (See Comments)    Chest Pain   . Wheat Bran   . Amoxicillin Nausea And Vomiting    Reaction:  Migraine headache  . Penicillins Nausea And Vomiting    Migraine Headaches   Family History  Problem Relation Age of Onset  . Stroke Mother   . Lung cancer Mother   . Heart disease Mother   . Diabetes Mother   . Hypertension Father   . Stroke Father   . Lung cancer Father   . Heart disease Father   . Kidney disease Father   . Seizures Father        epilepsy, and sisters x 2  . Pancreatic cancer Sister   . Lung cancer Sister   . Colon cancer Maternal Aunt        12 relatives  . Uterine cancer Unknown        aunt  . Heart disease Unknown        grandmother  . Clotting disorder Sister   . Heart disease Sister        x 2  . Kidney disease Sister        x 2  . Breast cancer Sister   . Colon cancer Paternal Aunt   . Colon cancer Paternal Aunt    Social History   Socioeconomic  History  . Marital status: Married    Spouse name: Rushie Chestnut.  . Number of children: 2  . Years of education: Not on file  . Highest education level: Not on file  Occupational History  . Occupation: Disability    Employer: RETIRED  Social Needs  . Financial resource strain: Not on file  . Food insecurity:    Worry: Not on file    Inability: Not on file  . Transportation needs:    Medical: Not on file    Non-medical: Not on file  Tobacco Use  . Smoking status: Never Smoker  . Smokeless tobacco: Never Used  Substance and Sexual Activity  . Alcohol use: No  . Drug use: No  . Sexual activity: Not Currently  Lifestyle  . Physical activity:    Days per week: Not on file  Minutes per session: Not on file  . Stress: Not on file  Relationships  . Social connections:    Talks on phone: Not on file    Gets together: Not on file    Attends religious service: Not on file    Active member of club or organization: Not on file    Attends meetings of clubs or organizations: Not on file    Relationship status: Not on file  Other Topics Concern  . Not on file  Social History Narrative   She retired on disability   Married   2 Children   Depression screen [facility] 2/9 04/21/2017 03/19/2017 03/13/2017 03/06/2017 01/23/2017  Decreased Interest 0 0 0 0 0  Down, Depressed, Hopeless 0 0 0 0 1  PHQ - 2 Score 0 0 0 0 1  Altered sleeping - - - - -  Tired, decreased energy - - - - -  Change in appetite - - - - -  Feeling bad or failure about yourself  - - - - -  Trouble concentrating - - - - -  Moving slowly or fidgety/restless - - - - -  Suicidal thoughts - - - - -  PHQ-9 Score - - - - -  Difficult doing work/chores - - - - -  Some recent data might be hidden    Review of Systems  Constitutional: Negative for chills, fatigue, fever and unexpected weight change.  Respiratory: Negative for cough.   Gastrointestinal: Negative for constipation, diarrhea, nausea and vomiting.  Skin: Negative for  rash and wound.  Neurological: Negative for dizziness, weakness and headaches.       Objective:   Physical Exam  Constitutional: She is oriented to person, place, and time. She appears well-developed and well-nourished. No distress.  HENT:  Head: Normocephalic and atraumatic.  Eyes: Pupils are equal, round, and reactive to light. EOM are normal.  Neck: Neck supple.  Cardiovascular: Normal rate.  Pulmonary/Chest: Effort normal. No respiratory distress.  Musculoskeletal: Normal range of motion.  Neurological: She is alert and oriented to person, place, and time.  Skin: Skin is warm and dry.  Psychiatric: She has a normal mood and affect. Her behavior is normal.  Nursing note and vitals reviewed.   BP (!) 96/56 (BP Location: Left Arm, Patient Position: Sitting, Cuff Size: Normal)   Pulse 92   Temp 98 F (36.7 C) (Oral)   Ht '5\' 5"'  (1.651 m)   Wt 116 lb 3.2 oz (52.7 kg)   SpO2 98%   BMI 19.34 kg/m      Assessment & Plan:   1. Idiopathic progressive polyneuropathy   2. Abnormal chest x-ray     Orders Placed This Encounter  Procedures  . Hepatitis B vaccine adult IM    #1  . Care order/instruction:    Scheduling Instructions:     Spirometry IF NEB IS ORDERED PLEASE DO A SPIROMETRY BEFORE AND AFTER.    Meds ordered this encounter  Medications  . albuterol (PROVENTIL) (2.5 MG/3ML) 0.083% nebulizer solution 2.5 mg    I personally performed the services described in this documentation, which was scribed in my presence. The recorded information has been reviewed and considered, and addended by me as needed.   Kayla [name], M.D.  Primary Care at [facility] [address] [place], [place] [address] [phone] phone [phone] fax  04/25/17 3:54 AM

## 2017-04-21 NOTE — Patient Instructions (Addendum)
I will write the letter for Agent Orange testing within the next 2d and notify you by MyChart when ready    IF you received an x-ray today, you will receive an invoice from [facility]. Please contact [facility] at [phone] with questions or concerns regarding your invoice.   IF you received labwork today, you will receive an invoice from [facility]. Please contact [facility] at [phone] with questions or concerns regarding your invoice.   Our billing staff will not be able to assist you with questions regarding bills from these companies.  You will be contacted with the lab results as soon as they are available. The fastest way to get your results is to activate your My Chart account. Instructions are located on the last page of this paperwork. If you have not heard from [place] regarding the results in 2 weeks, please contact this office.      Peripheral Neuropathy Peripheral neuropathy is a type of nerve damage. It affects nerves that carry signals between the spinal cord and other parts of the body. These are called peripheral nerves. With peripheral neuropathy, one nerve or a group of nerves may be damaged. What are the causes? Many things can damage peripheral nerves. For some people with peripheral neuropathy, the cause is unknown. Some causes include:  Diabetes. This is the most common cause of peripheral neuropathy.  Injury to a nerve.  Pressure or stress on a nerve that lasts a long time.  Too little vitamin B. Alcoholism can lead to this.  Infections.  Autoimmune diseases, such as multiple sclerosis and systemic lupus erythematosus.  Inherited nerve diseases.  Some medicines, such as cancer drugs.  Toxic substances, such as lead and mercury.  Too little blood flowing to the legs.  Kidney disease.  Thyroid disease.  What are the signs or symptoms? Different people have different symptoms. The symptoms you have will depend on which of your  nerves is damaged. Common symptoms include:  Loss of feeling (numbness) in the feet and hands.  Tingling in the feet and hands.  Pain that burns.  Very sensitive skin.  Weakness.  Not being able to move a part of the body (paralysis).  Muscle twitching.  Clumsiness or poor coordination.  Loss of balance.  Not being able to control your bladder.  Feeling dizzy.  Sexual problems.  How is this diagnosed? Peripheral neuropathy is a symptom, not a disease. Finding the cause of peripheral neuropathy can be hard. To figure that out, your health care provider will take a medical history and do a physical exam. A neurological exam will also be done. This involves checking things affected by your brain, spinal cord, and nerves (nervous system). For example, your health care provider will check your reflexes, how you move, and what you can feel. Other types of tests may also be ordered, such as:  Blood tests.  A test of the fluid in your spinal cord.  Imaging tests, such as CT scans or an MRI.  Electromyography (EMG). This test checks the nerves that control muscles.  Nerve conduction velocity tests. These tests check how fast messages pass through your nerves.  Nerve biopsy. A small piece of nerve is removed. It is then checked under a microscope.  How is this treated?  Medicine is often used to treat peripheral neuropathy. Medicines may include: ? Pain-relieving medicines. Prescription or over-the-counter medicine may be suggested. ? Antiseizure medicine. This may be used for pain. ? Antidepressants. These also may help ease pain  from neuropathy. ? Lidocaine. This is a numbing medicine. You might wear a patch or be given a shot. ? Mexiletine. This medicine is typically used to help control irregular heart rhythms.  Surgery. Surgery may be needed to relieve pressure on a nerve or to destroy a nerve that is causing pain.  Physical therapy to help movement.  Assistive  devices to help movement. Follow these instructions at home:  Only take over-the-counter or prescription medicines as directed by your health care provider. Follow the instructions carefully for any given medicines. Do not take any other medicines without first getting approval from your health care provider.  If you have diabetes, work closely with your health care provider to keep your blood sugar under control.  If you have numbness in your feet: ? Check every day for signs of injury or infection. Watch for redness, warmth, and swelling. ? Wear padded socks and comfortable shoes. These help protect your feet.  Do not do things that put pressure on your damaged nerve.  Do not smoke. Smoking keeps blood from getting to damaged nerves.  Avoid or limit alcohol. Too much alcohol can cause a lack of B vitamins. These vitamins are needed for healthy nerves.  Develop a good support system. Coping with peripheral neuropathy can be stressful. Talk to a mental health specialist or join a support group if you are struggling.  Follow up with your health care provider as directed. Contact a health care provider if:  You have new signs or symptoms of peripheral neuropathy.  You are struggling emotionally from dealing with peripheral neuropathy.  You have a fever. Get help right away if:  You have an injury or infection that is not healing.  You feel very dizzy or begin vomiting.  You have chest pain.  You have trouble breathing. This information is not intended to replace advice given to you by your health care provider. Make sure you discuss any questions you have with your health care provider. Document Released: 12/21/2001 Document Revised: 06/08/2015 Document Reviewed: 09/07/2012 Elsevier Interactive Patient Education  2017 [employer]  Chronic Obstructive Pulmonary Disease Chronic obstructive pulmonary disease (COPD) is a long-term (chronic) condition that affects the lungs. COPD  is a general term that can be used to describe many different lung problems that cause lung swelling (inflammation) and limit airflow, including chronic bronchitis and emphysema. If you have COPD, your lung function will probably never return to normal. In most cases, it gets worse over time. However, there are steps you can take to slow the progression of the disease and improve your quality of life. What are the causes? This condition may be caused by:  Smoking. This is the most common cause.  Certain genes passed down through families.  What increases the risk? The following factors may make you more likely to develop this condition:  Secondhand smoke from cigarettes, pipes, or cigars.  Exposure to chemicals and other irritants such as fumes and dust in the work environment.  Chronic lung conditions or infections.  What are the signs or symptoms? Symptoms of this condition include:  Shortness of breath, especially during physical activity.  Chronic cough with a large amount of thick mucus. Sometimes the cough may not have any mucus (dry cough).  Wheezing.  Rapid breaths.  Gray or bluish discoloration (cyanosis) of the skin, especially in your fingers, toes, or lips.  Feeling tired (fatigue).  Weight loss.  Chest tightness.  Frequent infections.  Episodes when breathing symptoms become much  worse (exacerbations).  Swelling in the ankles, feet, or legs. This may occur in later stages of the disease.  How is this diagnosed? This condition is diagnosed based on:  Your medical history.  A physical exam.  You may also have tests, including:  Lung (pulmonary) function tests. This may include a spirometry test, which measures your ability to exhale properly.  Chest X-ray.  CT scan.  Blood tests.  How is this treated? This condition may be treated with:  Medicines. These may include inhaled rescue medicines to treat acute exacerbations as well as long-term, or  maintenance, medicines to prevent flare-ups of COPD. ? Bronchodilators help treat COPD by dilating the airways to allow increased airflow and make your breathing more comfortable. ? Steroids can reduce airway inflammation and help prevent exacerbations.  Smoking cessation. If you smoke, your health care provider may ask you to quit, and may also recommend therapy or replacement products to help you quit.  Pulmonary rehabilitation. This may involve working with a team of health care providers and specialists, such as respiratory, occupational, and physical therapists.  Exercise and physical activity. These are beneficial for nearly all people with COPD.  Nutrition therapy to gain weight, if you are underweight.  Oxygen. Supplemental oxygen therapy is only helpful if you have a low oxygen level in your blood (hypoxemia).  Lung surgery or transplant.  Palliative care. This is to help people with COPD feel comfortable when treatment is no longer working.  Follow these instructions at home: Medicines  Take over-the-counter and prescription medicines (inhaled or pills) only as told by your health care provider.  Talk to your health care provider before taking any cough or allergy medicines. You may need to avoid certain medicines that dry out your airways. Lifestyle  If you are a smoker, the most important thing that you can do is to stop smoking. Do not use any products that contain nicotine or tobacco, such as cigarettes and e-cigarettes. If you need help quitting, ask your health care provider. Continuing to smoke will cause the disease to progress faster.  Avoid exposure to things that irritate your lungs, such as smoke, chemicals, and fumes.  Stay active, but balance activity with periods of rest. Exercise and physical activity will help you maintain your ability to do things you want to do.  Learn and use relaxation techniques to manage stress and to control your breathing.  Get the  right amount of sleep and get quality sleep. Most adults need 7 or more hours per night.  Eat healthy foods. Eating smaller, more frequent meals and resting before meals may help you maintain your strength. Controlled breathing Learn and use controlled breathing techniques as directed by your health care provider. Controlled breathing techniques include:  Pursed lip breathing. Start by breathing in (inhaling) through your nose for 1 second. Then, purse your lips as if you were going to whistle and breathe out (exhale) through the pursed lips for 2 seconds.  Diaphragmatic breathing. Start by putting one hand on your abdomen just above your waist. Inhale slowly through your nose. The hand on your abdomen should move out. Then purse your lips and exhale slowly. You should be able to feel the hand on your abdomen moving in as you exhale.  Controlled coughing Learn and use controlled coughing to clear mucus from your lungs. Controlled coughing is a series of short, progressive coughs. The steps of controlled coughing are: 1. Lean your head slightly forward. 2. Breathe in deeply using  diaphragmatic breathing. 3. Try to hold your breath for 3 seconds. 4. Keep your mouth slightly open while coughing twice. 5. Spit any mucus out into a tissue. 6. Rest and repeat the steps once or twice as needed.  General instructions  Make sure you receive all the vaccines that your health care provider recommends, especially the pneumococcal and influenza vaccines. Preventing infection and hospitalization is very important when you have COPD.  Use oxygen therapy and pulmonary rehabilitation if directed to by your health care provider. If you require home oxygen therapy, ask your health care provider whether you should purchase a pulse oximeter to measure your oxygen level at home.  Work with your health care provider to develop a COPD action plan. This will help you know what steps to take if your condition gets  worse.  Keep other chronic health conditions under control as told by your health care provider.  Avoid extreme temperature and humidity changes.  Avoid contact with people who have an illness that spreads from person to person (is contagious), such as viral infections or pneumonia.  Keep all follow-up visits as told by your health care provider. This is important. Contact a health care provider if:  You are coughing up more mucus than usual.  There is a change in the color or thickness of your mucus.  Your breathing is more labored than usual.  Your breathing is faster than usual.  You have difficulty sleeping.  You need to use your rescue medicines or inhalers more often than expected.  You have trouble doing routine activities such as getting dressed or walking around the house. Get help right away if:  You have shortness of breath while you are resting.  You have shortness of breath that prevents you from: ? Being able to talk. ? Performing your usual physical activities.  You have chest pain lasting longer than 5 minutes.  Your skin color is more blue (cyanotic) than usual.  You measure low oxygen saturations for longer than 5 minutes with a pulse oximeter.  You have a fever.  You feel too tired to breathe normally. Summary  Chronic obstructive pulmonary disease (COPD) is a long-term (chronic) condition that affects the lungs.  Your lung function will probably never return to normal. In most cases, it gets worse over time. However, there are steps you can take to slow the progression of the disease and improve your quality of life.  Treatment for COPD may include taking medicines, quitting smoking, pulmonary rehabilitation, and changes to diet and exercise. As the disease progresses, you may need oxygen therapy, a lung transplant, or palliative care.  To help manage your condition, do not smoke, avoid exposure to things that irritate your lungs, stay up to date  on all vaccines, and follow your health care provider's instructions for taking medicines. This information is not intended to replace advice given to you by your health care provider. Make sure you discuss any questions you have with your health care provider. Document Released: 10/10/2004 Document Revised: 02/05/2016 Document Reviewed: 02/05/2016 Elsevier Interactive Patient Education  [employer].

## 2017-04-30 ENCOUNTER — Ambulatory Visit
Admission: RE | Admit: 2017-04-30 | Discharge: 2017-04-30 | Disposition: A | Discharge status: Home / Self Care | Payer: Medicare Other | Source: Ambulatory Visit | Attending: Family Medicine | Admitting: Family Medicine

## 2017-04-30 DIAGNOSIS — Z1231 Encounter for screening mammogram for malignant neoplasm of breast: Secondary | ICD-10-CM

## 2017-05-06 ENCOUNTER — Telehealth: Payer: Self-pay | Admitting: Neurology

## 2017-05-06 NOTE — Telephone Encounter (Signed)
Pt left a VM message saying she has not heard anything from the neurology office in  [place] and wants to get in to see them to see if they can do anything for her, referral needed

## 2017-05-08 NOTE — Telephone Encounter (Signed)
[facility], spoke with April. She checked, the information was not rcvd I sent on 04/07/17. Was given another fax number, sending info again.  Called Pt, LMOVM advising of above, along with contact number for her to call them in a few days to check status.

## 2017-05-12 ENCOUNTER — Encounter: Payer: Self-pay | Admitting: Neurology

## 2017-05-15 ENCOUNTER — Encounter: Payer: Self-pay | Admitting: *Deleted

## 2017-05-22 ENCOUNTER — Telehealth: Payer: Self-pay | Admitting: Neurology

## 2017-05-22 NOTE — Telephone Encounter (Signed)
This patient called and is needing a referral Faxed to [facility],  Dr. Rozanna Boer- [place]. Thanks

## 2017-05-22 NOTE — Telephone Encounter (Signed)
Called and spoke with Pt, advsd her this has been faxed, was corrected on 05/08/17, confirmed the fax number. She asked I fax again.

## 2017-05-23 ENCOUNTER — Ambulatory Visit: Payer: Self-pay | Admitting: Dietician

## 2017-06-05 DIAGNOSIS — G629 Polyneuropathy, unspecified: Secondary | ICD-10-CM | POA: Diagnosis not present

## 2017-06-05 DIAGNOSIS — M170 Bilateral primary osteoarthritis of knee: Secondary | ICD-10-CM | POA: Diagnosis not present

## 2017-06-05 DIAGNOSIS — G894 Chronic pain syndrome: Secondary | ICD-10-CM | POA: Diagnosis not present

## 2017-06-05 DIAGNOSIS — I739 Peripheral vascular disease, unspecified: Secondary | ICD-10-CM | POA: Diagnosis not present

## 2017-06-05 DIAGNOSIS — M47812 Spondylosis without myelopathy or radiculopathy, cervical region: Secondary | ICD-10-CM | POA: Diagnosis not present

## 2017-06-05 DIAGNOSIS — G5602 Carpal tunnel syndrome, left upper limb: Secondary | ICD-10-CM | POA: Diagnosis not present

## 2017-06-10 ENCOUNTER — Encounter: Payer: Self-pay | Admitting: Family Medicine

## 2017-06-23 ENCOUNTER — Ambulatory Visit (INDEPENDENT_AMBULATORY_CARE_PROVIDER_SITE_OTHER): Payer: Medicare Other | Admitting: Family Medicine

## 2017-06-23 ENCOUNTER — Other Ambulatory Visit: Payer: Self-pay

## 2017-06-23 ENCOUNTER — Encounter: Payer: Self-pay | Admitting: Family Medicine

## 2017-06-23 VITALS — BP 108/62 | HR 88 | Temp 98.1°F | Resp 16 | Ht 65.0 in | Wt 113.0 lb

## 2017-06-23 DIAGNOSIS — I951 Orthostatic hypotension: Secondary | ICD-10-CM

## 2017-06-23 DIAGNOSIS — M47812 Spondylosis without myelopathy or radiculopathy, cervical region: Secondary | ICD-10-CM | POA: Diagnosis not present

## 2017-06-23 DIAGNOSIS — K5903 Drug induced constipation: Secondary | ICD-10-CM | POA: Diagnosis not present

## 2017-06-23 DIAGNOSIS — E559 Vitamin D deficiency, unspecified: Secondary | ICD-10-CM

## 2017-06-23 DIAGNOSIS — M810 Age-related osteoporosis without current pathological fracture: Secondary | ICD-10-CM | POA: Diagnosis not present

## 2017-06-23 DIAGNOSIS — T402X5A Adverse effect of other opioids, initial encounter: Secondary | ICD-10-CM

## 2017-06-23 DIAGNOSIS — K5909 Other constipation: Secondary | ICD-10-CM | POA: Diagnosis not present

## 2017-06-23 DIAGNOSIS — D509 Iron deficiency anemia, unspecified: Secondary | ICD-10-CM

## 2017-06-23 DIAGNOSIS — M5030 Other cervical disc degeneration, unspecified cervical region: Secondary | ICD-10-CM | POA: Diagnosis not present

## 2017-06-23 MED ORDER — NALOXEGOL OXALATE 25 MG PO TABS: 25.0000 mg | ORAL_TABLET | Freq: Every day | ORAL | 1 refills | Status: DC

## 2017-06-23 MED ORDER — FREESTYLE LIBRE 14 DAY SENSOR MISC: 1.0000 [IU] | 5 refills | Status: DC

## 2017-06-23 MED ORDER — FREESTYLE LIBRE 14 DAY READER DEVI: 1.0000 | 3 refills | Status: DC

## 2017-06-23 NOTE — Progress Notes (Signed)
Subjective:  By signing my name below, I, Kayla [name], attest that this documentation has been prepared under the direction and in the presence of [name], MD Electronically Signed: [name], ED Scribe 06/23/2017 at 11:08 AM.   Patient ID: Kayla [name], female    DOB: Feb 08, 1945, 72 y.o.   MRN: [id number]  Chief Complaint  Patient presents with  . Medication Recheck    questions about lasix and potassium   . Neuropathy    2 mth recheck    HPI Kayla [name] is a 72 y.o. female who presents to Primary Care at [facility] for f/u.  Neuropathy Pt states that her feet feel fine at the beginning on the day, but she is in "excrutiating" pain by the latter part of the day. States her feet feel like "thick concrete bricks" and she is unable to walk by the end of the day. Reports she either feels a burning or freezing pain, similar to frost bite from the soles and dorsum of her feet that radiates into the mid calves. States she has been taking Lasix around 3-5 PM for leg swelling, which causes urinary frequency. She does have an appointment scheduled with [facility] on 6/29 with Dr. Larkin [name].  DM Pt reports an "episode" while on vacation after eating bbq. States she took 2 glucose tabs, felt as if she was "drunk" and napped in the car until she felt better. Reports "episodes" includes diaphoresis after eating, sudden onset of abdominal distention, abdominal cramps, nausea, palpitations, tremor within a few minutes, within the first 1-2 bites. States symptoms last for ~1 hr, resolve with lying down, without BMs. States she checks her blood glucose during those times with readings of 110-115. States she can only eat a few bites at a time. Has noticed that symptoms are worse with eating foods containing sugar, however, she is able to drink a full bottle of Pepsi without any symptoms. Also reports that she had a TV dinner last night which included beef tips with potatoes without any symptoms.  Uses Zofran daily.  Choking Pt also states that she is still aspirating while sleeping and eating. States food gets stuck in her hiatal hernia while eating and she is unable to make herself throw the food up. States she coughs a lot while sleeping, is getting choked on her saliva and notes a burning sensation in her lungs when the saliva enters and causes her to wake. States she has been sleeping on an "airplane" pillow recently to elevate her head. She is taking Nexium qd.  Neck Pain Neck pain is worse with rotating to the L. Using Lidoderm patch and Voltaren gel with some relief.  Vit D Pt states that she was supposed to begin Vit D injections but never received a call. Last saw Dr. [name] 07/12/16. Wanted her to start Alendronate for osteo but asked to consult on parathyroid analog. Recommended her to start Tymlos. Last dexa 02/16/16.  Pt recently returned from an anniversary vacation with her husband where they renewed their vows after 14 yrs, went zip lining and horseback riding.  Past Medical History:  Diagnosis Date  . Allergy   . Anal fissure 10/08/2016  . Anemia    anemia in the past  . Angina    takes Propanolol prn and it stops her chest  . Anxiety   . Barrett esophagus    not consistently present  . Cataract   . Chronic kidney disease    kidney stones  and UTI  . Complication of anesthesia    either  BP or pulse dropped with last surgery  . DDD (degenerative disc disease)    cervical and lumbar  . Dementia   . Depression    post traumatic stress  disorder  . Diabetes ([place]) 11/18/2016  . Diabetes mellitus    sugar goes up and down diet controlled  . Dysrhythmia    brought on by stress  . External hemorrhoids   . Fatigue   . Fibromyalgia    neuropathy knees ankles and toes, bladder  . GERD (gastroesophageal reflux disease)   . H/O hyperparathyroidism   . Headache(784.0)    migraines  . Hiatal hernia   . History of kidney stones   . Hypertension    3 small  brain aneurysms  . Hypoglycemia 11/08/2016  . IBS (irritable bowel syndrome)   . Internal hemorrhoids   . Macular degeneration   . Migraines   . Mitral regurgitation 12/11/2015  . Osteoarthritis    all over  . Osteopetrosis   . Peripheral vascular disease (HCC)    superficial phlebitis in left calf  . Personal history of colonic polyps 07/22/2013   07/2013 - 3 small adenomas - repeat colonoscopy 2018   Current Outpatient Medications on File Prior to Visit  Medication Sig Dispense Refill  . acarbose (PRECOSE) 25 MG tablet Take 1 tablet (25 mg total) by mouth 3 (three) times daily with meals. 90 tablet 5  . Ascorbic Acid (VITAMIN C) 1000 MG tablet Take 1,000 mg by mouth daily.    Marland Kitchen aspirin-acetaminophen-caffeine (EXCEDRIN MIGRAINE) 250-250-65 MG tablet Take 2 tablets by mouth 2 (two) times daily.    . beta carotene w/minerals (OCUVITE) tablet Take 1 tablet by mouth 2 (two) times daily.    . Cholecalciferol (VITAMIN D3) 2000 UNITS TABS Take 2,000 Units by mouth daily.    . clonazePAM (KLONOPIN) 1 MG tablet Take 0.5-1 tablets (0.5-1 mg total) by mouth 2 (two) times daily as needed for anxiety. 180 tablet 1  . Continuous Blood Gluc Receiver (FREESTYLE LIBRE 14 DAY READER) DEVI 1 Device every 14 (fourteen) days by Does not apply route. 6 Device 3  . Cyanocobalamin (VITAMIN B-12) 1000 MCG SUBL Place 5,000 mcg under the tongue daily.     . cyclobenzaprine (FLEXERIL) 10 MG tablet Take 1 tablet (10 mg total) by mouth 3 (three) times daily as needed for muscle spasms. 270 tablet 1  . cycloSPORINE (RESTASIS) 0.05 % ophthalmic emulsion Place 1 drop into both eyes 2 (two) times daily. 5.5 mL 5  . diclofenac sodium (VOLTAREN) 1 % GEL Apply 2 g topically 4 (four) times daily. 700 g 3  . dicyclomine (BENTYL) 10 MG capsule Take 1-2 tab 3 times daily AC as needed for spasms and cramping. 270 capsule 1  . diltiazem 2 % GEL Apply 1 application topically 2 (two) times daily as needed (pain from fissure). 30 g 2    . DULoxetine (CYMBALTA) 30 MG capsule Take 1 capsule (30 mg total) by mouth daily. X 2 weeks, then bid 180 capsule 0  . esomeprazole (NEXIUM) 40 MG capsule Take 1 capsule (40 mg total) by mouth daily before breakfast. 90 capsule 3  . fentaNYL (DURAGESIC - DOSED MCG/HR) 50 MCG/HR Place 50 mcg onto the skin every 3 (three) days.    . furosemide (LASIX) 40 MG tablet Take 1 tablet (40 mg total) by mouth daily as needed for edema. 90 tablet 0  . glucose blood test  strip Use to check cbgs 6 times daily as directed by physician. Testing freq: hypoglycemia, hyperglycemia. ICD10: R73.03, E 16.2 600 each 5  . HYDROcodone-acetaminophen (NORCO) 7.5-325 MG tablet Take 1 tablet every 6 (six) hours as needed by mouth for moderate pain.    . hypromellose (SYSTANE OVERNIGHT THERAPY) 0.3 % GEL ophthalmic ointment Place 1 drop into both eyes at bedtime. Reported on 02/11/2015    . IRON PO Take by mouth.    Marland Kitchen L-Methylfolate-Algae-B12-B6 (METANX) 3-90.314-2-35 MG CAPS Take 1 capsule 2 (two) times daily by mouth.    . lidocaine (LIDODERM) 5 % Place 1 patch onto the skin as needed. Remove & Discard patch within 12 hours. May dispense as 3 month supply 90 patch 3  . Lifitegrast (XIIDRA) 5 % SOLN Place 1 drop into both eyes daily.     . meloxicam (MOBIC) 15 MG tablet TAKE 1 TABLET BY MOUTH EVERY DAY 30 tablet 0  . metroNIDAZOLE (METROGEL) 1 % gel Apply topically daily. 45 g 0  . mirabegron ER (MYRBETRIQ) 25 MG TB24 tablet Take 1 tablet (25 mg total) by mouth daily. 90 tablet 3  . mupirocin ointment (BACTROBAN) 2 % Apply 1 application topically 3 (three) times daily. 30 g 1  . Naloxone HCl 0.4 MG/0.4ML SOAJ Administer 0.87m [place] at first sign of opioid overdose and repeat every 2 minutes as needed for resuscitation. Call 911 immediately 5 Package 0  . Nerve Stimulator (CEFALY KIT) DEVI 20 Minutes by Does not apply route as directed. 1 Device 0  . NON FORMULARY [facility]  Peripheral Neuropathy Cream- Bupivacaine 1%,  Doxepin 3%, Gabapentin 6%, Pentoxifylline 3%, Topiramate 1% Apply 1-2 grams to affected area 3-4 times daily Qty. 120 gm 3 refills    . ondansetron (ZOFRAN) 8 MG tablet Take 1 tablet (8 mg total) by mouth every 8 (eight) hours as needed for nausea or vomiting. 60 tablet 0  . ONE TOUCH LANCETS MISC Use to check cbgs 6 times daily as directed by physician. Testing freq: hypoglycemia, hyperglycemia. ICD10: R73.03, E 16.2 600 each 5  . potassium chloride SA (K-DUR,KLOR-CON) 20 MEQ tablet Take 1 tablet (20 mEq total) by mouth daily as needed (only take along with the furosemide). 90 tablet 0  . pregabalin (LYRICA) 50 MG capsule Take 1 capsule (50 mg total) by mouth 3 (three) times daily. (Patient taking differently: Take 50 mg by mouth 2 (two) times daily. ) 90 capsule 3  . Probiotic Product (PROBIOTIC-10 PO) Take by mouth.     . pyridoxine (B-6) 200 MG tablet Take 200 mg by mouth daily.    . SUMAtriptan (IMITREX) 100 MG tablet Take 1 tablet at earliest onset of headache, may repeat in 2 hours if headache persists or reoccurs. (Patient taking differently: Take 100 mg by mouth every 3 (three) days. ) 30 tablet 1  . traZODone (DESYREL) 100 MG tablet Take 0.5-1 tablets (50-100 mg total) by mouth at bedtime as needed for sleep. And 1/2 -1 if wakes at 4am. 90 tablet 1  . valACYclovir (VALTREX) 1000 MG tablet Take 1 tablet (1,000 mg total) by mouth 3 (three) times daily. 21 tablet 0  . Vitamin D, Ergocalciferol, (DRISDOL) 50000 units CAPS capsule Take 1 capsule (50,000 Units total) by mouth every 7 (seven) days. 12 capsule 0  . docusate sodium (COLACE) 250 MG capsule Take 1 capsule (250 mg total) by mouth daily. (Patient not taking: Reported on 06/23/2017) 90 capsule 3  . [DISCONTINUED] buPROPion (WELLBUTRIN) 75 MG tablet  Take 75 mg by mouth daily after breakfast.      No current facility-administered medications on file prior to visit.    Allergies  Allergen Reactions  . Ambien [Zolpidem Tartrate]      Hallucinations  . Codeine Anaphylaxis  . Oxycodone-Acetaminophen Shortness Of Breath  . Tylox [Oxycodone-Acetaminophen] Anaphylaxis    Chest pain  . Darvocet [Propoxyphene N-Acetaminophen]     Chest pain  . Darvon [Propoxyphene] Itching  . Lorcet [Hydrocodone-Acetaminophen]     Chest pain  . Opium     hallucinations  . Vicodin [Hydrocodone-Acetaminophen] Other (See Comments)    Chest Pain   . Wheat Bran   . Amoxicillin Nausea And Vomiting    Reaction:  Migraine headache  . Penicillins Nausea And Vomiting    Migraine Headaches   Review of Systems  Constitutional: Positive for diaphoresis.  Respiratory: Positive for choking.   Cardiovascular: Positive for palpitations.  Gastrointestinal: Positive for abdominal distention, abdominal pain, constipation (chronic) and nausea.  Musculoskeletal: Positive for neck pain.  Neurological: Positive for tremors.      Objective:   Physical Exam  Constitutional: She is oriented to person, place, and time. She appears well-developed and well-nourished. No distress.  HENT:  Head: Normocephalic and atraumatic.  Eyes: Conjunctivae and EOM are normal.  Neck: Neck supple. No tracheal deviation present.  Cardiovascular: Normal rate and regular rhythm.  Pulmonary/Chest: Effort normal and breath sounds normal. No respiratory distress.  Musculoskeletal: Normal range of motion.  Neurological: She is alert and oriented to person, place, and time.  Skin: Skin is warm and dry.  Psychiatric: She has a normal mood and affect. Her behavior is normal.  Nursing note and vitals reviewed.  BP 108/62   Pulse 88   Temp 98.1 F (36.7 C)   Resp 16   Ht _0  (1.651 m)   Wt 113 lb (51.3 kg)   SpO2 95%   BMI 18.80 kg/m    Orthostatic VS for the past 24 hrs:  BP- Lying Pulse- Lying BP- Sitting Pulse- Sitting BP- Standing at 0 minutes Pulse- Standing at 0 minutes  06/23/17 1221 118/74 89 121/69 88 109/68 86      Assessment & Plan:   1. Iron  deficiency anemia, unspecified iron deficiency anemia type   2. Vitamin D deficiency   3. Orthostatic hypotension   4. Age-related osteoporosis without current pathological fracture   5. Chronic constipation   6. DISC DISEASE, CERVICAL   7. Cervical facet joint syndrome   8. Therapeutic opioid induced constipation     Orders Placed This Encounter  Procedures  . Comprehensive metabolic panel  . CBC with Differential/Platelet  . Iron, TIBC and Ferritin Panel  . VITAMIN D 25 Hydroxy (Vit-D Deficiency, Fractures)  . PTH, Intact and Calcium  . Orthostatic vital signs    Meds ordered this encounter  Medications  . DISCONTD: Continuous Blood Gluc Receiver (FREESTYLE LIBRE 14 DAY READER) DEVI    Sig: 1 Device by Does not apply route every 14 (fourteen) days.    Dispense:  6 Device    Refill:  3  . DISCONTD: Continuous Blood Gluc Sensor (FREESTYLE LIBRE 14 DAY SENSOR) MISC    Sig: 1 Units by Does not apply route every 14 (fourteen) days.    Dispense:  6 each    Refill:  5  . naloxegol oxalate (MOVANTIK) 25 MG TABS tablet    Sig: Take 1 tablet (25 mg total) by mouth daily. Take 1 hr before or  2 hrs after meal    Dispense:  90 tablet    Refill:  1   Over 40 min spent in face-to-face evaluation of and consultation with patient and coordination of care.  Over 50% of this time was spent counseling this patient regarding potential etiologies and treatment of her peripheral neuropathy. The idea of if it is possible that some of her somatic symptoms might be unexplainable or coming from her brain (aka pscyhe) as a way her body is expressing stress.  Over the [place] through contact to her husband when he returned and how she might be able to get tested for this - I will write letter on her behalf to ask for this.   I personally performed the services described in this documentation, which was scribed in my presence. The recorded information has been reviewed and  considered, and addended by me as needed.   [name], M.D.  Primary Care at [facility] [address] [place],  [address] [phone] phone [phone] fax  07/11/17 10:34 AM

## 2017-06-23 NOTE — Patient Instructions (Addendum)
Decrease your iron supplement to once a day.   IF you received an x-ray today, you will receive an invoice from [facility]. Please contact [facility] at [phone] with questions or concerns regarding your invoice.   IF you received labwork today, you will receive an invoice from [place]. Please contact [facility] at [phone] with questions or concerns regarding your invoice.   Our billing staff will not be able to assist you with questions regarding bills from these companies.  You will be contacted with the lab results as soon as they are available. The fastest way to get your results is to activate your My Chart account. Instructions are located on the last page of this paperwork. If you have not heard from [place] regarding the results in 2 weeks, please contact this office.     Pain Without a Known Cause Pain can occur in any part of the body and can range from mild to severe. Sometimes no cause can be found for why you are having pain. Some types of pain that can occur without a known cause include:  Headache.  Back pain.  Abdominal pain.  Neck pain.  How is this diagnosed? Your health care provider will try to find the cause of your pain. This may include:  Physical exam.  Medical history.  Blood tests.  Urine tests.  X-rays.  If no cause is found, your health care provider may diagnose you with pain without a known cause. How is this treated? Treatment depends on the kind of pain you have. Your health care provider may prescribe medicines to help relieve your pain. Follow these instructions at home:  Take medicines only as directed by your health care provider.  Stop any activities that cause pain. During periods of severe pain, bed rest may help.  Try to reduce your stress with activities such as yoga or meditation. Talk to your health care provider for other stress-reducing activity recommendations.  Exercise regularly, if approved by  your health care provider.  Eat a healthy diet that includes fruits and vegetables. This may improve pain. Talk to your health care provider if you have any questions about your diet. Contact a health care provider if: If you have a painful condition and no reason can be found for the pain or the pain gets worse, it is important to follow up with your health care provider. It may be necessary to repeat tests and look further for a possible cause. This information is not intended to replace advice given to you by your health care provider. Make sure you discuss any questions you have with your health care provider. Document Released: 09/25/2000 Document Revised: 06/08/2015 Document Reviewed: 05/18/2013 Elsevier Interactive Patient Education  2018 [employer].  Neuropathic Pain Neuropathic pain is pain caused by damage to the nerves that are responsible for certain sensations in your body (sensory nerves). The pain can be caused by damage to:  The sensory nerves that send signals to your spinal cord and brain (peripheral nervous system).  The sensory nerves in your brain or spinal cord (central nervous system).  Neuropathic pain can make you more sensitive to pain. What would be a minor sensation for most people may feel very painful if you have neuropathic pain. This is usually a long-term condition that can be difficult to treat. The type of pain can differ from person to person. It may start suddenly (acute), or it may develop slowly and last for a long time (chronic). Neuropathic pain may  come and go as damaged nerves heal or may stay at the same level for years. It often causes emotional distress, loss of sleep, and a lower quality of life. What are the causes? The most common cause of damage to a sensory nerve is diabetes. Many other diseases and conditions can also cause neuropathic pain. Causes of neuropathic pain can be classified as:  Toxic. Many drugs and chemicals can cause toxic  damage. The most common cause of toxic neuropathic pain is damage from drug treatment for cancer (chemotherapy).  Metabolic. This type of pain can happen when a disease causes imbalances that damage nerves. Diabetes is the most common of these diseases. Vitamin B deficiency caused by long-term alcohol abuse is another common cause.  Traumatic. Any injury that cuts, crushes, or stretches a nerve can cause damage and pain. A common example is feeling pain after losing an arm or leg (phantom limb pain).  Compression-related. If a sensory nerve gets trapped or compressed for a long period of time, the blood supply to the nerve can be cut off.  Vascular. Many blood vessel diseases can cause neuropathic pain by decreasing blood supply and oxygen to nerves.  Autoimmune. This type of pain results from diseases in which the body's defense system mistakenly attacks sensory nerves. Examples of autoimmune diseases that can cause neuropathic pain include lupus and multiple sclerosis.  Infectious. Many types of viral infections can damage sensory nerves and cause pain. Shingles infection is a common cause of this type of pain.  Inherited. Neuropathic pain can be a symptom of many diseases that are passed down through families (genetic).  What are the signs or symptoms? The main symptom is pain. Neuropathic pain is often described as:  Burning.  Shock-like.  Stinging.  Hot or cold.  Itching.  How is this diagnosed? No single test can diagnose neuropathic pain. Your health care provider will do a physical exam and ask you about your pain. You may use a pain scale to describe how bad your pain is. You may also have tests to see if you have a high sensitivity to pain and to help find the cause and location of any sensory nerve damage. These tests may include:  Imaging studies, such as: ? X-rays. ? CT scan. ? MRI.  Nerve conduction studies to test how well nerve signals travel through your sensory  nerves (electrodiagnostic testing).  Stimulating your sensory nerves through electrodes on your skin and measuring the response in your spinal cord and brain (somatosensory evoked potentials).  How is this treated? Treatment for neuropathic pain may change over time. You may need to try different treatment options or a combination of treatments. Some options include:  Over-the-counter pain relievers.  Prescription medicines. Some medicines used to treat other conditions may also help neuropathic pain. These include medicines to: ? Control seizures (anticonvulsants). ? Relieve depression (antidepressants).  Prescription-strength pain relievers (narcotics). These are usually used when other pain relievers do not help.  Transcutaneous nerve stimulation (TENS). This uses electrical currents to block painful nerve signals. The treatment is painless.  Topical and local anesthetics. These are medicines that numb the nerves. They can be injected as a nerve block or applied to the skin.  Alternative treatments, such as: ? Acupuncture. ? Meditation. ? Massage. ? Physical therapy. ? Pain management programs. ? Counseling.  Follow these instructions at home:  Learn as much as you can about your condition.  Take medicines only as directed by your health care provider.  Work closely with all your health care providers to find what works best for you.  Have a good support system at home.  Consider joining a chronic pain support group. Contact a health care provider if:  Your pain treatments are not helping.  You are having side effects from your medicines.  You are struggling with fatigue, mood changes, depression, or anxiety. This information is not intended to replace advice given to you by your health care provider. Make sure you discuss any questions you have with your health care provider. Document Released: 09/28/2003 Document Revised: 07/21/2015 Document Reviewed:  06/10/2013 Elsevier Interactive Patient Education  2018 [place].  Opioid Pain Medicine Information Opioids are powerful medicines that are used to treat moderate to severe pain. Opioids should be taken with the supervision of a trained health care provider. They should be taken for the shortest period of time as possible. This is because opioids can be addictive and the longer you take opioids, the greater your risk of addiction (opioid use disorder). What do opioids do? Opioids help to reduce or eliminate pain. When used for short periods of time, they can help you:  Sleep better.  Do better in physical or occupational therapy.  Feel better in the first few days after an injury.  Recover from surgery.  What is a pain treatment plan? A pain treatment plan is an agreement between you and your health care provider. Pain is unique to each person, and treatments vary depending on your condition. To manage your pain successfully, you and your health care provider need to understand each other and work together. To help you do this:  Discuss the goals of your treatment, including how much pain you might expect to have and how you will manage the pain.  Review the risks and benefits of taking opioid medicines for your condition.  Remember that a good treatment plan uses more than one approach and minimizes the chance of side effects.  Be honest about the amount of medicines you take, and about any drug or alcohol use.  Get pain medicine prescriptions from only one care provider.  Keep all follow-up visits as told by your health care provider. This is important.  What instructions should I follow while taking opioid pain medicine? While you are taking the medicine and for 8 hours after you stop taking the medicine, follow these instructions:  Do not drive.  Do not use machinery or power tools.  Do not sign legal documents.  Do not drink alcohol.  Do not take sleeping  pills.  Do not supervise children by yourself.  Do not participate in activities that require climbing or being in high places.  Do not enter a body of water-such as a lake, river, ocean, spa, or swimming pool-unless an adult is nearby who can monitor and help you.  What kinds of side effects can opioids cause? Opioids can cause side effects, such as:  Constipation.  Nausea.  Vomiting.  Drowsiness.  Confusion.  Opioid use disorder.  Breathing difficulties (respiratory depression).  Using opioid pain medicines for longer than 3 days increases your risk of these side effects. Taking opioid pain medicine for a long period of time can affect your ability to do daily tasks. It also puts you at risk for:  Motor vehicle accidents.  Depression.  Suicide.  Heart attack.  Overdose, which can sometimes lead to death.  What are alternative ways to manage pain? Pain can be managed with many types of alternative  treatments. Ask your health care provider to refer you to one or more specialists who can help you manage pain through:  Physical or occupational therapy.  Counseling (cognitive behavioral therapy).  Good nutrition.  Biofeedback.  Massage.  Meditation.  Non-opioid medicine.  Following a gentle exercise program.  How can I keep others safe while I am taking opioid pain medicine?  Keep pain medicine in a locked cabinet, or in a secure area where children cannot reach it.  Never share your pain medicine with anyone.  Do not save any leftover pills. If you have leftover medicine, you can: 1. Bring the medicine to a prescription take-back program. This is usually offered by the county or Event organiser. 2. Throw it out in the trash. To do this:  Mix the medicine with undesirable trash such as pet waste or food.  Put the mixture in a sealed container or plastic bag.  Throw it in the trash.  Destroy any personal information on the prescription bottle. How  do I stop taking opioids if I have been taking them for a long time? If you have been taking opioid medicine for more than a few weeks, you may need to slowly decrease (taper) how much you take until you stop completely. Tapering your use of opioids can decrease your chances of experiencing withdrawal symptoms, such as:  Pain and cramping in the abdomen.  Nausea.  Sweating.  Sleepiness.  Restlessness.  Uncontrollable shaking (tremors).  Cravings for the medicine.  Do not attempt to taper your use of opioids on your own. Talk with your health care provider about how to do this. Your health care provider may prescribe a step-down schedule based on how much medicine you are taking and how long you have been taking it. Where to find support: If you have been taking opioids for a long time, you may benefit from receiving support for quitting from a local support group or counselor. Ask your health care provider for a referral to these resources in your area. Where to find more information:  Centers for Disease Control and Prevention (CDC): [employer] Get help right away if: Seek medical care right away if you are taking opioids and you (or people close to you) notice any of the following:  Difficulty breathing.  Breathing that is slower or more shallow than normal.  A very slow heartbeat (pulse).  Severe confusion.  Unconsciousness.  Sleepiness.  Slurred speech.  Nausea and vomiting.  Cold, clammy skin.  Blue lips or fingernails.  Limpness.  Abnormally small pupils.  If you think that you or someone else may have taken too much of an opioid medicine, get medical help right away. Do not wait to see if the symptoms go away on their own.  If you ever feel like you may hurt yourself or others, or have thoughts about taking your own life, get help right away. You can go to your nearest emergency department or call:  Your local emergency  services (911 in the [place]  The hotline of the [facility] 631 248 0960 in the [place]  A suicide crisis helpline, such as the [place] at (785)384-1585. This is open 24 hours a day.  Summary  Opioid medicines can help you manage moderate to severe pain for a short period of time.  Discuss the goals of your treatment with your health care provider, including how much pain you might expect to have and how you will manage the pain.  A good  treatment plan uses more than one approach. Pain can be managed with many types of alternative treatments.  If you think that you or someone else may have taken too much of an opioid, get medical help right away. This information is not intended to replace advice given to you by your health care provider. Make sure you discuss any questions you have with your health care provider. Document Released: 01/27/2015 Document Revised: 04/19/2016 Document Reviewed: 08/12/2014 Elsevier Interactive Patient Education  [employer].

## 2017-06-24 LAB — COMPREHENSIVE METABOLIC PANEL
A/G RATIO: 2.4 — AB (ref 1.2–2.2)
ALBUMIN: 4.6 g/dL (ref 3.6–4.8)
ALK PHOS: 77 IU/L (ref 39–117)
ALT: 34 IU/L — ABNORMAL HIGH (ref 0–32)
AST: 32 IU/L (ref 0–40)
BILIRUBIN TOTAL: 0.3 mg/dL (ref 0.0–1.2)
BUN / CREAT RATIO: 32 — AB (ref 12–28)
BUN: 20 mg/dL (ref 8–27)
CHLORIDE: 104 mmol/L (ref 96–106)
CO2: 24 mmol/L (ref 20–29)
Calcium: 9.7 mg/dL (ref 8.7–10.3)
Creatinine, Ser: 0.63 mg/dL (ref 0.57–1.00)
GFR calc non Af Amer: 92 mL/min/{1.73_m2} (ref >59)
GFR, EST AFRICAN AMERICAN: 106 mL/min/{1.73_m2} (ref >59)
GLUCOSE: 93 mg/dL (ref 65–99)
Globulin, Total: 1.9 g/dL (ref 1.5–4.5)
POTASSIUM: 4.6 mmol/L (ref 3.5–5.2)
Sodium: 143 mmol/L (ref 134–144)
TOTAL PROTEIN: 6.5 g/dL (ref 6.0–8.5)

## 2017-06-24 LAB — CBC WITH DIFFERENTIAL/PLATELET
BASOS ABS: 0 10*3/uL (ref 0.0–0.2)
Basos: 1 %
EOS (ABSOLUTE): 0.2 10*3/uL (ref 0.0–0.4)
Eos: 4 %
Hematocrit: 38 % (ref 34.0–46.6)
Hemoglobin: 12.6 g/dL (ref 11.1–15.9)
Immature Grans (Abs): 0 10*3/uL (ref 0.0–0.1)
Immature Granulocytes: 0 %
LYMPHS ABS: 2.3 10*3/uL (ref 0.7–3.1)
Lymphs: 39 %
MCH: 30.2 pg (ref 26.6–33.0)
MCHC: 33.2 g/dL (ref 31.5–35.7)
MCV: 91 fL (ref 79–97)
MONOCYTES: 5 %
MONOS ABS: 0.3 10*3/uL (ref 0.1–0.9)
NEUTROS ABS: 3 10*3/uL (ref 1.4–7.0)
Neutrophils: 51 %
PLATELETS: 232 10*3/uL (ref 150–450)
RBC: 4.17 x10E6/uL (ref 3.77–5.28)
RDW: 14.1 % (ref 12.3–15.4)
WBC: 5.9 10*3/uL (ref 3.4–10.8)

## 2017-06-24 LAB — VITAMIN D 25 HYDROXY (VIT D DEFICIENCY, FRACTURES): Vit D, 25-Hydroxy: 26.3 ng/mL — ABNORMAL LOW (ref 30.0–100.0)

## 2017-06-24 LAB — PTH, INTACT AND CALCIUM: PTH: 35 pg/mL (ref 15–65)

## 2017-06-24 LAB — IRON,TIBC AND FERRITIN PANEL
FERRITIN: 38 ng/mL (ref 15–150)
Iron Saturation: 29 % (ref 15–55)
Iron: 94 ug/dL (ref 27–139)
TIBC: 326 ug/dL (ref 250–450)
UIBC: 232 ug/dL (ref 118–369)

## 2017-06-26 ENCOUNTER — Telehealth: Payer: Self-pay

## 2017-06-26 MED ORDER — FREESTYLE LIBRE 14 DAY SENSOR MISC: 1.0000 [IU] | 5 refills | Status: DC

## 2017-06-26 NOTE — Telephone Encounter (Signed)
Copied from [place] [id number]. Topic: General - Other >> Jun 25, 2017  1:43 PM Mcneil, Ja-Kwan wrote: Reason for CRM: Pt states she received a Rx for a soft vertical spine collar but she has been to several pharmacies and no one carries it. Pt wants to know if this is a specialty item that has to be ordered or if someone can inform her where to go to get it. Cb# [phone].  Returned call to pt.  LMOVM - Soft Cervical collars are available at [email or web address] and [email or web address].  Unsure if [facility] has in store.  [facility] on [address] Also may have them-gave pt the phone number for [facility].

## 2017-06-26 NOTE — Telephone Encounter (Signed)
Copied from [place] [id number]. Topic: General - Other >> Jun 25, 2017  1:33 PM Yvette Rack wrote: Reason for CRM: Pt states the pharmacy needs a diagnosis code for the Rx Continuous Blood Gluc Receiver (FREESTYLE LIBRE 14 DAY READER) [place] and Continuous Blood Gluc Sensor (FREESTYLE LIBRE [place]) [place]. Pt states the diagnosis code should be sent to [id number] - [place], [place] - [address] [phone] (Phone) [phone] (Fax)   Resent order electronically with the diagnosis code E11.9

## 2017-06-27 NOTE — Telephone Encounter (Signed)
That is absolutely perfect - thank you so much. If needed for future, the reason for increased testing is hypoglycemia

## 2017-06-28 ENCOUNTER — Telehealth: Payer: Self-pay | Admitting: Family Medicine

## 2017-06-28 DIAGNOSIS — R140 Abdominal distension (gaseous): Secondary | ICD-10-CM | POA: Diagnosis not present

## 2017-06-28 NOTE — Telephone Encounter (Signed)
Pt has been calling the pharmacy to check on the status of her Freestyle Libre sensor but the pharmacy states that they do not have this.  I see it was corrected by Kalman Shan on 6/13 and confirmed by pharmacy on that date at 7:59AM.  Please advise

## 2017-06-30 NOTE — Telephone Encounter (Signed)
Spoke with pharmacy patient has picked up

## 2017-07-02 ENCOUNTER — Ambulatory Visit (INDEPENDENT_AMBULATORY_CARE_PROVIDER_SITE_OTHER): Payer: Medicare Other | Admitting: Cardiovascular Disease

## 2017-07-02 ENCOUNTER — Encounter: Payer: Self-pay | Admitting: Cardiovascular Disease

## 2017-07-02 VITALS — BP 102/60 | HR 76 | Ht 65.0 in | Wt 110.0 lb

## 2017-07-02 DIAGNOSIS — R0789 Other chest pain: Secondary | ICD-10-CM | POA: Diagnosis not present

## 2017-07-02 DIAGNOSIS — I951 Orthostatic hypotension: Secondary | ICD-10-CM

## 2017-07-02 NOTE — Patient Instructions (Signed)
Medication Instructions:  Your physician recommends that you continue on your current medications as directed. Please refer to the Current Medication list given to you today.   Labwork: None Ordered   Testing/Procedures: None Ordered   Follow-Up: Your physician wants you to follow-up in: 1 year with Dr. Nahser.  You will receive a reminder letter in the mail two months in advance. If you don't receive a letter, please call our office to schedule the follow-up appointment.   If you need a refill on your cardiac medications before your next appointment, please call your pharmacy.   Thank you for choosing [facility]! Antwian Santaana, RN [phone]    

## 2017-07-02 NOTE — Progress Notes (Signed)
Kayla [name] Date of Birth  1945-09-11       [facility] Office [address], [place], [place]  [address]   [place], [place]  [address] [phone]     [phone]   Fax  [phone]    Fax [phone]  Problem List: 1. Mitral valve prolapse-mitral regurgitation 2. Hypertension 3. Barrett's esophagus 4. Fibromyalgia 5. History chest pain-Kayla [name] had a normal heart catheterization in 1993. Stress Myoview study in 2008 was normal. 6. Brain aneurism -  7. S/p Roux-en-Y surgery  for Barretts esophagus  History of Present Illness: Kayla [name] continues to have chest pain.  Kayla [name] takes PRN propranolol which seems to help.  It typically occurs in the afternoon.  Almost always with rest.  Kayla [name] does not get any regular exercise.  Kayla [name] has had lots of bladder infections due to kidney stones recently that have not permitted her to exercise.  Kayla [name]'s had lots of problems with anxiety.  July 15, 2014:  Kayla [name] presents for evaluation Her BP has been very low - possibly due to the various medications .  Kayla [name] has had left sided chest pain for years.  Has had a normal cath in the remote past and had a normal myoview  Several years ago .   Kayla [name] was recently seen by Dr. Newman Pies for some palpitations The palpitations would come and go .  Off and on for hours.  Was started on atenolol which has helped the palpitations. Kayla [name] had taken atenolol for years ( many years ago) but was off atenolol for several years.  Her palpitations have resolved after starting the atenolol .    These episodes of tachycardia are at times associated with episodes of CP . Has lost a lot of weight over the past several years. Kayla [name] had a Roux -in - Y gastric bypass ( supposedly to prevent esophageal cancer from Barretts esophagutis. Is not able to eat much.  Can eat 1/4 cup of food at a time .    Sept. 19, 2016:  Continues to have palpitations  - improve with  propranolol Continues to have poor appetite.  Is generally very weak Continues to see her primary medical doctor and her pain management doctor .   Nov. 27,2017:  Seems to be doing well from a cardiac standpoint. Having urology issues.  May need a bladder tack. Still has   Nov. 7, 2018 Has been diagnosed with DM and has diabetic neuropathy.   Lots of pain in her feet.   Still has some CP Still has some palpittations  .   Has lost 40 lbs over the past 10 years.  Admitted that Kayla [name] is not eating well.   July 02, 2017: Follow up for MVP and noncardiac CP  Hx of HTN but has been low for the past several years.   Doing well. Now has a Freestyle Libra glucose monitor  Doing well with that  Continues to lose weight Wt is 110 lbs. Down 6 lbs from last Nov.  Not eating well.   Needs to eat more protein      Current Outpatient Medications on File Prior to Visit  Medication Sig Dispense Refill  . acarbose (PRECOSE) 25 MG tablet Take 1 tablet (25 mg total) by mouth 3 (three) times daily with meals. 90 tablet 5  . Ascorbic Acid (VITAMIN C) 1000 MG tablet Take 1,000 mg by mouth daily.    Marland Kitchen aspirin-acetaminophen-caffeine (  EXCEDRIN MIGRAINE) 250-250-65 MG tablet Take 2 tablets by mouth 2 (two) times daily.    . beta carotene w/minerals (OCUVITE) tablet Take 1 tablet by mouth 2 (two) times daily.    . Cholecalciferol (VITAMIN D3) 2000 UNITS TABS Take 2,000 Units by mouth daily.    . clonazePAM (KLONOPIN) 1 MG tablet Take 0.5-1 tablets (0.5-1 mg total) by mouth 2 (two) times daily as needed for anxiety. 180 tablet 1  . Continuous Blood Gluc Sensor (FREESTYLE LIBRE 14 DAY SENSOR) MISC 1 Units by Does not apply route every 14 (fourteen) days. 6 each 5  . Cyanocobalamin (VITAMIN B-12) 1000 MCG SUBL Place 5,000 mcg under the tongue daily.     . cyclobenzaprine (FLEXERIL) 10 MG tablet Take 1 tablet (10 mg total) by mouth 3 (three) times daily as needed for muscle spasms. 270 tablet 1  .  cycloSPORINE (RESTASIS) 0.05 % ophthalmic emulsion Place 1 drop into both eyes 2 (two) times daily. 5.5 mL 5  . diclofenac sodium (VOLTAREN) 1 % GEL Apply 2 g topically 4 (four) times daily. 700 g 3  . dicyclomine (BENTYL) 10 MG capsule Take 1-2 tab 3 times daily AC as needed for spasms and cramping. 270 capsule 1  . diltiazem 2 % GEL Apply 1 application topically 2 (two) times daily as needed (pain from fissure). 30 g 2  . docusate sodium (COLACE) 250 MG capsule Take 1 capsule (250 mg total) by mouth daily. 90 capsule 3  . DULoxetine (CYMBALTA) 30 MG capsule Take 1 capsule (30 mg total) by mouth daily. X 2 weeks, then bid 180 capsule 0  . esomeprazole (NEXIUM) 40 MG capsule Take 1 capsule (40 mg total) by mouth daily before breakfast. 90 capsule 3  . fentaNYL (DURAGESIC - DOSED MCG/HR) 50 MCG/HR Place 50 mcg onto the skin every 3 (three) days.    . furosemide (LASIX) 40 MG tablet Take 1 tablet (40 mg total) by mouth daily as needed for edema. 90 tablet 0  . glucose blood test strip Use to check cbgs 6 times daily as directed by physician. Testing freq: hypoglycemia, hyperglycemia. ICD10: R73.03, E 16.2 600 each 5  . HYDROcodone-acetaminophen (NORCO) 7.5-325 MG tablet Take 1 tablet every 6 (six) hours as needed by mouth for moderate pain.    . hypromellose (SYSTANE OVERNIGHT THERAPY) 0.3 % GEL ophthalmic ointment Place 1 drop into both eyes at bedtime. Reported on 02/11/2015    . IRON PO Take by mouth.    Marland Kitchen L-Methylfolate-Algae-B12-B6 (METANX) 3-90.314-2-35 MG CAPS Take 1 capsule 2 (two) times daily by mouth.    . lidocaine (LIDODERM) 5 % Place 1 patch onto the skin as needed. Remove & Discard patch within 12 hours. May dispense as 3 month supply 90 patch 3  . Lifitegrast (XIIDRA) 5 % SOLN Place 1 drop into both eyes daily.     . meloxicam (MOBIC) 15 MG tablet TAKE 1 TABLET BY MOUTH EVERY DAY 30 tablet 0  . metroNIDAZOLE (METROGEL) 1 % gel Apply topically daily. 45 g 0  . mirabegron ER (MYRBETRIQ)  25 MG TB24 tablet Take 1 tablet (25 mg total) by mouth daily. 90 tablet 3  . mupirocin ointment (BACTROBAN) 2 % Apply 1 application topically 3 (three) times daily. 30 g 1  . naloxegol oxalate (MOVANTIK) 25 MG TABS tablet Take 1 tablet (25 mg total) by mouth daily. Take 1 hr before or 2 hrs after meal 90 tablet 1  . Naloxone HCl 0.4 MG/0.4ML SOAJ Administer 0.74m  Imogene at first sign of opioid overdose and repeat every 2 minutes as needed for resuscitation. Call 911 immediately 5 Package 0  . Nerve Stimulator (CEFALY KIT) DEVI 20 Minutes by Does not apply route as directed. 1 Device 0  . NON FORMULARY [facility]  Peripheral Neuropathy Cream- Bupivacaine 1%, Doxepin 3%, Gabapentin 6%, Pentoxifylline 3%, Topiramate 1% Apply 1-2 grams to affected area 3-4 times daily Qty. 120 gm 3 refills    . ondansetron (ZOFRAN) 8 MG tablet Take 1 tablet (8 mg total) by mouth every 8 (eight) hours as needed for nausea or vomiting. 60 tablet 0  . ONE TOUCH LANCETS MISC Use to check cbgs 6 times daily as directed by physician. Testing freq: hypoglycemia, hyperglycemia. ICD10: R73.03, E 16.2 600 each 5  . potassium chloride SA (K-DUR,KLOR-CON) 20 MEQ tablet Take 1 tablet (20 mEq total) by mouth daily as needed (only take along with the furosemide). 90 tablet 0  . pregabalin (LYRICA) 50 MG capsule Take 1 capsule (50 mg total) by mouth 3 (three) times daily. (Patient taking differently: Take 50 mg by mouth 2 (two) times daily. ) 90 capsule 3  . Probiotic Product (PROBIOTIC-10 PO) Take by mouth.     . pyridoxine (B-6) 200 MG tablet Take 200 mg by mouth daily.    . SUMAtriptan (IMITREX) 100 MG tablet Take 1 tablet at earliest onset of headache, may repeat in 2 hours if headache persists or reoccurs. (Patient taking differently: Take 100 mg by mouth every 3 (three) days. ) 30 tablet 1  . traZODone (DESYREL) 100 MG tablet Take 0.5-1 tablets (50-100 mg total) by mouth at bedtime as needed for sleep. And 1/2 -1 if wakes at  4am. 90 tablet 1  . valACYclovir (VALTREX) 1000 MG tablet Take 1 tablet (1,000 mg total) by mouth 3 (three) times daily. 21 tablet 0  . Vitamin D, Ergocalciferol, (DRISDOL) 50000 units CAPS capsule Take 1 capsule (50,000 Units total) by mouth every 7 (seven) days. 12 capsule 0  . [DISCONTINUED] buPROPion (WELLBUTRIN) 75 MG tablet Take 75 mg by mouth daily after breakfast.      No current facility-administered medications on file prior to visit.     Allergies  Allergen Reactions  . Ambien [Zolpidem Tartrate]     Hallucinations  . Codeine Anaphylaxis  . Oxycodone-Acetaminophen Shortness Of Breath  . Tylox [Oxycodone-Acetaminophen] Anaphylaxis    Chest pain  . Darvocet [Propoxyphene N-Acetaminophen]     Chest pain  . Darvon [Propoxyphene] Itching  . Lorcet [Hydrocodone-Acetaminophen]     Chest pain  . Opium     hallucinations  . Vicodin [Hydrocodone-Acetaminophen] Other (See Comments)    Chest Pain   . Wheat Bran   . Amoxicillin Nausea And Vomiting    Reaction:  Migraine headache  . Penicillins Nausea And Vomiting    Migraine Headaches    Past Medical History:  Diagnosis Date  . Allergy   . Anal fissure 10/08/2016  . Anemia    anemia in the past  . Angina    takes Propanolol prn and it stops her chest  . Anxiety   . Barrett esophagus    not consistently present  . Cataract   . Chronic kidney disease    kidney stones and UTI  . Complication of anesthesia    either  BP or pulse dropped with last surgery  . DDD (degenerative disc disease)    cervical and lumbar  . Dementia   . Depression    post  traumatic stress  disorder  . Diabetes ([place]) 11/18/2016  . Diabetes mellitus    sugar goes up and down diet controlled  . Dysrhythmia    brought on by stress  . External hemorrhoids   . Fatigue   . Fibromyalgia    neuropathy knees ankles and toes, bladder  . GERD (gastroesophageal reflux disease)   . H/O hyperparathyroidism   . Headache(784.0)    migraines  . Hiatal  hernia   . History of kidney stones   . Hypertension    3 small brain aneurysms  . Hypoglycemia 11/08/2016  . IBS (irritable bowel syndrome)   . Internal hemorrhoids   . Macular degeneration   . Migraines   . Mitral regurgitation 12/11/2015  . Osteoarthritis    all over  . Osteopetrosis   . Peripheral vascular disease (HCC)    superficial phlebitis in left calf  . Personal history of colonic polyps 07/22/2013   07/2013 - 3 small adenomas - repeat colonoscopy 2018    Past Surgical History:  Procedure Laterality Date  . ABDOMINAL HYSTERECTOMY    . APPENDECTOMY    . CARDIAC CATHETERIZATION  03/22/1991   EF 61%  . CARDIOVASCULAR STRESS TEST  10/03/2006  . CHOLECYSTECTOMY    . COLONOSCOPY  06/23/2008   normal  . DILATION AND CURETTAGE OF UTERUS     x2  . ESOPHAGUS SURGERY    . EXPLORATORY LAPAROTOMY  1993  . GASTRIC BYPASS  1999  . GASTRIC RESTRICTION SURGERY  1991  . Right Arm Surgery    . Right Knee Arthroscopy    . Rt wrist fx  2009  . stomach stappeling  1991  . STONE EXTRACTION WITH BASKET  2012  . TONSILLECTOMY    . TUBAL LIGATION    . UPPER GASTROINTESTINAL ENDOSCOPY  06/23/2008   w/Dil, Barrett's esophagus  . US ECHOCARDIOGRAPHY  01/21/2007   EF 55-60%  . US ECHOCARDIOGRAPHY  08/30/2003   EF 55-60%    Social History   Tobacco Use  Smoking Status Never Smoker  Smokeless Tobacco Never Used    Social History   Substance and Sexual Activity  Alcohol Use No    Family History  Problem Relation Age of Onset  . Stroke Mother   . Lung cancer Mother   . Heart disease Mother   . Diabetes Mother   . Hypertension Father   . Stroke Father   . Lung cancer Father   . Heart disease Father   . Kidney disease Father   . Seizures Father        epilepsy, and sisters x 2  . Pancreatic cancer Sister   . Lung cancer Sister   . Colon cancer Maternal Aunt        12 relatives  . Uterine cancer Unknown        aunt  . Heart disease Unknown        grandmother  . Clotting  disorder Sister   . Heart disease Sister        x 2  . Kidney disease Sister        x 2  . Breast cancer Sister   . Colon cancer Paternal Aunt   . Colon cancer Paternal Aunt     Reviw of Systems:  Reviewed in the HPI.  All other systems are negative.  Physical Exam: Blood pressure 102/60, pulse 76, height _0  (1.651 m), weight 110 lb (49.9 kg), SpO2 97 %.  GEN:  Well nourished, well  developed in no acute distress HEENT: Normal NECK: No JVD; No carotid bruits LYMPHATICS: No lymphadenopathy CARDIAC: RR, no murmurs, rubs, gallops RESPIRATORY:  Clear to auscultation without rales, wheezing or rhonchi  ABDOMEN: Soft, non-tender, non-distended MUSCULOSKELETAL:  No edema; No deformity  SKIN: Warm and dry NEUROLOGIC:  Alert and oriented x 3    ECG: July 02, 2017:   NSR at 38.   Normal ECG   Assessment / Plan:   1.  Orthostatic hypotension: Kayla [name] presents with symptoms of orthostatic hypotension as well as palpitations. Kayla [name] has continued to lose weight and has lost about 40 pounds over the past 10 years.  Kayla [name] is not eating enough protein or salt.  Urged her to eat more protein and more salt.  Her blood pressure is quite low today.  I think this will also help with her palpitations.  2.  Chest pain: Kayla [name] is at episodes of chest discomfort for years.  These do not sound any different.  2. Palpitations: Kayla [name] continues to have occasional palpitations.  There is a chance that Kayla [name] might have dysautonomia.  I have encouraged her to eat more salt and protein in order to normalize her blood pressure.    Mertie Moores, MD  07/02/2017 10:24 AM    [address],  [place], [place]  [address] Pager [phone] Phone: [phone]; Fax: [phone]

## 2017-07-08 ENCOUNTER — Telehealth: Payer: Self-pay | Admitting: Family Medicine

## 2017-07-08 NOTE — Telephone Encounter (Signed)
Copied from [place] [id number]. Topic: Quick Communication - Rx Refill/Question >> Jul 08, 2017  3:50 PM Margot Ables wrote: Medication: DULoxetine (CYMBALTA) 30 MG capsule - pt is out of this medication - she said that [facility] has contacted [place] and advised her that no refills were on last order  meloxicam (MOBIC) 15 MG tablet - was ordered by different doctor but said Dr. Brigitte Pulse would prefer to manage all medications.  Has the patient contacted their pharmacy? yes Preferred Pharmacy (with phone number or street name): [facility] - [place], [place] - [address] (Phone) [phone] (Fax)

## 2017-07-09 ENCOUNTER — Other Ambulatory Visit: Payer: Self-pay | Admitting: *Deleted

## 2017-07-09 MED ORDER — DULOXETINE HCL 30 MG PO CPEP: 30.0000 mg | ORAL_CAPSULE | Freq: Every day | ORAL | 0 refills | Status: DC

## 2017-07-09 NOTE — Telephone Encounter (Signed)
Refill of mobic by historical provider  LRF 01/31/17  #30  0 refills  LOV 06/23/17  Dr. Brigitte Pulse   [facility] [place], [place] - [address]       [phone] (Phone) [phone] (Fax)

## 2017-07-11 MED ORDER — DULOXETINE HCL 30 MG PO CPEP: 30.0000 mg | ORAL_CAPSULE | Freq: Two times a day (BID) | ORAL | 3 refills | Status: DC

## 2017-07-11 MED ORDER — MELOXICAM 15 MG PO TABS: 15.0000 mg | ORAL_TABLET | Freq: Every day | ORAL | 1 refills | Status: DC

## 2017-07-11 NOTE — Addendum Note (Signed)
Addended by: Shawnee Knapp on: 07/11/2017 10:33 AM   Modules accepted: Orders

## 2017-07-11 NOTE — Telephone Encounter (Signed)
meloxicam and cymbalta sent to [facility]

## 2017-07-15 ENCOUNTER — Encounter: Payer: Self-pay | Admitting: Neurology

## 2017-07-15 ENCOUNTER — Ambulatory Visit (INDEPENDENT_AMBULATORY_CARE_PROVIDER_SITE_OTHER): Payer: Medicare Other | Admitting: Neurology

## 2017-07-15 VITALS — BP 90/58 | HR 94 | Ht 65.0 in | Wt 110.0 lb

## 2017-07-15 DIAGNOSIS — G43009 Migraine without aura, not intractable, without status migrainosus: Secondary | ICD-10-CM

## 2017-07-15 DIAGNOSIS — G44229 Chronic tension-type headache, not intractable: Secondary | ICD-10-CM

## 2017-07-15 MED ORDER — TIZANIDINE HCL 2 MG PO TABS: ORAL_TABLET | ORAL | 0 refills | Status: DC

## 2017-07-15 MED ORDER — TIZANIDINE HCL 2 MG PO CAPS: 2.0000 mg | ORAL_CAPSULE | Freq: Three times a day (TID) | ORAL | 3 refills | Status: DC

## 2017-07-15 NOTE — Progress Notes (Signed)
NEUROLOGY FOLLOW UP OFFICE NOTE  Kayla [name] [id number]  HISTORY OF PRESENT ILLNESS: Kayla [name] is a 72 year old woman with fibromyalgia, anxiety, degenerative disc disease of cervical and lumbar spine, and history of nephrolithiasis and gastric bypass (1997) who follows up for idiopathic polyneuropathy and migraine.   UPDATE: Neuropathy:    Neuropathy is unchanged. She has appointment with [facility] for further evaluation.   Migraine:   She has had only one migraine over past 6 months.  However, she continues to have moderate daily tension-type headache Current NSAIDS:  Celebrex Current analgesics:  Lidoderm, voltaren 1% gel, Fentanyl patch Current triptans:  sumatriptan 124m Current anti-emetic:  no Current muscle relaxants:  Flexeril Current anti-anxiolytic:  clonazepam Current sleep aide:  trazodone Current Antihypertensive medications:  propranolol 145mfour times daily Current Antidepressant medications:  Cymbalta 3061mwice daily, Lyrica 56m58mice daily Current Anticonvulsant medications:   Current Vitamins/Herbal/Supplements:  Coenzyme q10 Other therapy:  physical therapy of neck.  Referred to [facility]   Caffeine:  2 cups of coffee daily Alcohol:  no Smoker:  no Diet:  poor Exercise:  no Depression/stress:  Family-related stress Sleep hygiene:  poor   HISTORY: I  Neuropathy:  Since July 2018, she has had increased swelling and burning numbness and tingling in the legs.  She had lower extremity vascular ABIs on 08/20/16, which was negative.  She has history of B12 deficiency but she takes injections and recent B12 level from 09/21/16 was over 2000.  Methylmalonic acid level was 209, RPR nonreactive, homocysteine 7.8.  Labs from 07/12/16 include normal SPEP, Sed Rate 2, CRP less than 0.3, and TSH 2.430 .  Vitamin D was 23 and she was advised to start supplementation.  Serum glucose has been 70s up to 110.  She also takes B6.  She also has history of weight  loss.  She has fibromyalgia and was previously diagnosed with neuropathy by another neurologist.  She has been on gabapentin for many years.  It was briefly discontinued to see if it was contributing to the swelling.  She reported no significant change, so it was restarted (she takes 300mg9mimes daily), although she thinks it helped a little bit.  NCV-EMG from 10/21/16 demonstrated chronic sensorimotor polyneuropathy of the predominantly axonal type as well as mild left median neuropathy at or distal to the wrist.  Other labs include B1 146.1, B6 10.7, Sed Rate 2, HIV negative, TSH 1.780, UPEP negative.   She is wondering if her neuropathy could be secondary to Agent[place]ort[place] Utiliti[place]ure.  Her husband was a [place] and reports cases of spouses exposed to [place] Utilitiesugh their husband's semen.  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  Her neuropathic symptoms started shortly after her return.  [place] [facility] [place].   II Migraine: Onset:  Since 1970, after her twin newborns passed away.  She has lived with life-long family-related stress Location:  Migraines are unilateral/parietal or bilateral retro-orbital, chronic tension type (bifrontal) Quality:  Severe crushing Initial Intensity:  10/10 Aura:  no Prodrome:  no Associated symptoms:  Photophobia, phonophobia.  No nausea, vomiting or visual disturbance Initial Duration:  Migraines 1 hour with sumatriptan, other headaches 15 minutes with Fioricet Initial Frequency:  Daily (3 to 4 days a month are severe migraine) Triggers/exacerbating factors:  Stress, Nexium Relieving factors:  Clonazepam, Fioricet, sumatriptan Activity:  Cannot function when severe (3-4 days per  month)   Past NSAIDS:  Ibuprofen, naproxen Past analgesics:  Cafergot, Excedrin, Tylenol, Tramadol, Fioricet Past abortive triptans:  no Past muscle relaxants:  no Past anti-emetic:  Zofran ODT  6m Past antihypertensive medications:  Lopressor Past antidepressant medications:  Elavil, Effexor, Zoloft Past anticonvulsant medications:  Topiramate, gabapentin 3026mtwice daily Past vitamins/Herbal/Supplements:  no Other past therapies:  yoga   Family history of headache:  Father, sister, brother   MRI and MRA of brain from 07/23/11 were personally reviewed and unremarkable.  PAST MEDICAL HISTORY: Past Medical History:  Diagnosis Date  . Allergy   . Anal fissure 10/08/2016  . Anemia    anemia in the past  . Angina    takes Propanolol prn and it stops her chest  . Anxiety   . Barrett esophagus    not consistently present  . Cataract   . Chronic kidney disease    kidney stones and UTI  . Complication of anesthesia    either  BP or pulse dropped with last surgery  . DDD (degenerative disc disease)    cervical and lumbar  . Dementia   . Depression    post traumatic stress  disorder  . [place]  . Diabetes mellitus    sugar goes up and down diet controlled  . Dysrhythmia    brought on by stress  . External hemorrhoids   . Fatigue   . Fibromyalgia    neuropathy knees ankles and toes, bladder  . GERD (gastroesophageal reflux disease)   . H/O hyperparathyroidism   . Headache(784.0)    migraines  . Hiatal hernia   . History of kidney stones   . Hypertension    3 small brain aneurysms  . Hypoglycemia 11/08/2016  . IBS (irritable bowel syndrome)   . Internal hemorrhoids   . Macular degeneration   . Migraines   . Mitral regurgitation 12/11/2015  . Osteoarthritis    all over  . Osteopetrosis   . Peripheral vascular disease (HCC)    superficial phlebitis in left calf  . Personal history of colonic polyps 07/22/2013   07/2013 - 3 small adenomas - repeat colonoscopy 2018    MEDICATIONS: Current Outpatient Medications on File Prior to Visit  Medication Sig Dispense Refill  . acarbose (PRECOSE) 25 MG tablet Take 1 tablet (25 mg total) by mouth 3  (three) times daily with meals. 90 tablet 5  . Ascorbic Acid (VITAMIN C) 1000 MG tablet Take 1,000 mg by mouth daily.    . Marland Kitchenspirin-acetaminophen-caffeine (EXCEDRIN MIGRAINE) 250-250-65 MG tablet Take 2 tablets by mouth 2 (two) times daily.    . beta carotene w/minerals (OCUVITE) tablet Take 1 tablet by mouth 2 (two) times daily.    . Cholecalciferol (VITAMIN D3) 2000 UNITS TABS Take 2,000 Units by mouth daily.    . clonazePAM (KLONOPIN) 1 MG tablet Take 0.5-1 tablets (0.5-1 mg total) by mouth 2 (two) times daily as needed for anxiety. 180 tablet 1  . Continuous Blood Gluc Sensor (FREESTYLE LIBRE 14 DAY SENSOR) MISC 1 Units by Does not apply route every 14 (fourteen) days. 6 each 5  . Cyanocobalamin (VITAMIN B-12) 1000 MCG SUBL Place 5,000 mcg under the tongue daily.     . cyclobenzaprine (FLEXERIL) 10 MG tablet Take 1 tablet (10 mg total) by mouth 3 (three) times daily as needed for muscle spasms. 270 tablet 1  . cycloSPORINE (RESTASIS) 0.05 % ophthalmic emulsion Place 1 drop into both eyes 2 (two) times daily. 5.5 mL 5  .  diclofenac sodium (VOLTAREN) 1 % GEL Apply 2 g topically 4 (four) times daily. 700 g 3  . dicyclomine (BENTYL) 10 MG capsule Take 1-2 tab 3 times daily AC as needed for spasms and cramping. 270 capsule 1  . diltiazem 2 % GEL Apply 1 application topically 2 (two) times daily as needed (pain from fissure). 30 g 2  . docusate sodium (COLACE) 250 MG capsule Take 1 capsule (250 mg total) by mouth daily. 90 capsule 3  . DULoxetine (CYMBALTA) 30 MG capsule Take 1 capsule (30 mg total) by mouth 2 (two) times daily. 180 capsule 3  . esomeprazole (NEXIUM) 40 MG capsule Take 1 capsule (40 mg total) by mouth daily before breakfast. 90 capsule 3  . fentaNYL (DURAGESIC - DOSED MCG/HR) 50 MCG/HR Place 50 mcg onto the skin every 3 (three) days.    . furosemide (LASIX) 40 MG tablet Take 1 tablet (40 mg total) by mouth daily as needed for edema. 90 tablet 0  . glucose blood test strip Use to check  cbgs 6 times daily as directed by physician. Testing freq: hypoglycemia, hyperglycemia. ICD10: R73.03, E 16.2 600 each 5  . HYDROcodone-acetaminophen (NORCO) 7.5-325 MG tablet Take 1 tablet every 6 (six) hours as needed by mouth for moderate pain.    . hypromellose (SYSTANE OVERNIGHT THERAPY) 0.3 % GEL ophthalmic ointment Place 1 drop into both eyes at bedtime. Reported on 02/11/2015    . IRON PO Take by mouth.    Marland Kitchen L-Methylfolate-Algae-B12-B6 (METANX) 3-90.314-2-35 MG CAPS Take 1 capsule 2 (two) times daily by mouth.    . lidocaine (LIDODERM) 5 % Place 1 patch onto the skin as needed. Remove & Discard patch within 12 hours. May dispense as 3 month supply 90 patch 3  . Lifitegrast (XIIDRA) 5 % SOLN Place 1 drop into both eyes daily.     . meloxicam (MOBIC) 15 MG tablet Take 1 tablet (15 mg total) by mouth daily. Prn pain 90 tablet 1  . metroNIDAZOLE (METROGEL) 1 % gel Apply topically daily. 45 g 0  . mirabegron ER (MYRBETRIQ) 25 MG TB24 tablet Take 1 tablet (25 mg total) by mouth daily. 90 tablet 3  . mupirocin ointment (BACTROBAN) 2 % Apply 1 application topically 3 (three) times daily. 30 g 1  . naloxegol oxalate (MOVANTIK) 25 MG TABS tablet Take 1 tablet (25 mg total) by mouth daily. Take 1 hr before or 2 hrs after meal 90 tablet 1  . Naloxone HCl 0.4 MG/0.4ML SOAJ Administer 0.16m [place] at first sign of opioid overdose and repeat every 2 minutes as needed for resuscitation. Call 911 immediately 5 Package 0  . Nerve Stimulator (CEFALY KIT) DEVI 20 Minutes by Does not apply route as directed. 1 Device 0  . NON FORMULARY [facility]  Peripheral Neuropathy Cream- Bupivacaine 1%, Doxepin 3%, Gabapentin 6%, Pentoxifylline 3%, Topiramate 1% Apply 1-2 grams to affected area 3-4 times daily Qty. 120 gm 3 refills    . ondansetron (ZOFRAN) 8 MG tablet Take 1 tablet (8 mg total) by mouth every 8 (eight) hours as needed for nausea or vomiting. 60 tablet 0  . ONE TOUCH LANCETS MISC Use to check cbgs 6  times daily as directed by physician. Testing freq: hypoglycemia, hyperglycemia. ICD10: R73.03, E 16.2 600 each 5  . potassium chloride SA (K-DUR,KLOR-CON) 20 MEQ tablet Take 1 tablet (20 mEq total) by mouth daily as needed (only take along with the furosemide). 90 tablet 0  . pregabalin (LYRICA) 50 MG  capsule Take 1 capsule (50 mg total) by mouth 3 (three) times daily. (Patient taking differently: Take 50 mg by mouth 2 (two) times daily. ) 90 capsule 3  . Probiotic Product (PROBIOTIC-10 PO) Take by mouth.     . pyridoxine (B-6) 200 MG tablet Take 200 mg by mouth daily.    . SUMAtriptan (IMITREX) 100 MG tablet Take 1 tablet at earliest onset of headache, may repeat in 2 hours if headache persists or reoccurs. (Patient taking differently: Take 100 mg by mouth every 3 (three) days. ) 30 tablet 1  . traZODone (DESYREL) 100 MG tablet Take 0.5-1 tablets (50-100 mg total) by mouth at bedtime as needed for sleep. And 1/2 -1 if wakes at 4am. 90 tablet 1  . valACYclovir (VALTREX) 1000 MG tablet Take 1 tablet (1,000 mg total) by mouth 3 (three) times daily. 21 tablet 0  . Vitamin D, Ergocalciferol, (DRISDOL) 50000 units CAPS capsule Take 1 capsule (50,000 Units total) by mouth every 7 (seven) days. 12 capsule 0  . [DISCONTINUED] buPROPion (WELLBUTRIN) 75 MG tablet Take 75 mg by mouth daily after breakfast.      No current facility-administered medications on file prior to visit.     ALLERGIES: Allergies  Allergen Reactions  . Ambien [Zolpidem Tartrate]     Hallucinations  . Codeine Anaphylaxis  . Oxycodone-Acetaminophen Shortness Of Breath  . Tylox [Oxycodone-Acetaminophen] Anaphylaxis    Chest pain  . Darvocet [Propoxyphene N-Acetaminophen]     Chest pain  . Darvon [Propoxyphene] Itching  . Lorcet [Hydrocodone-Acetaminophen]     Chest pain  . Opium     hallucinations  . Vicodin [Hydrocodone-Acetaminophen] Other (See Comments)    Chest Pain   . Wheat Bran   . Amoxicillin Nausea And Vomiting     Reaction:  Migraine headache  . Penicillins Nausea And Vomiting    Migraine Headaches    FAMILY HISTORY: Family History  Problem Relation Age of Onset  . Stroke Mother   . Lung cancer Mother   . Heart disease Mother   . Diabetes Mother   . Hypertension Father   . Stroke Father   . Lung cancer Father   . Heart disease Father   . Kidney disease Father   . Seizures Father        epilepsy, and sisters x 2  . Pancreatic cancer Sister   . Lung cancer Sister   . Colon cancer Maternal Aunt        12 relatives  . Uterine cancer Unknown        aunt  . Heart disease Unknown        grandmother  . Clotting disorder Sister   . Heart disease Sister        x 2  . Kidney disease Sister        x 2  . Breast cancer Sister   . Colon cancer Paternal Aunt   . Colon cancer Paternal Aunt     SOCIAL HISTORY: Social History   Socioeconomic History  . Marital status: Married    Spouse name: Rushie Chestnut.  . Number of children: 2  . Years of education: Not on file  . Highest education level: Not on file  Occupational History  . Occupation: Disability    Employer: RETIRED  Social Needs  . Financial resource strain: Not on file  . Food insecurity:    Worry: Not on file    Inability: Not on file  . Transportation needs:  Medical: Not on file    Non-medical: Not on file  Tobacco Use  . Smoking status: Never Smoker  . Smokeless tobacco: Never Used  Substance and Sexual Activity  . Alcohol use: No  . Drug use: No  . Sexual activity: Not Currently  Lifestyle  . Physical activity:    Days per week: Not on file    Minutes per session: Not on file  . Stress: Not on file  Relationships  . Social connections:    Talks on phone: Not on file    Gets together: Not on file    Attends religious service: Not on file    Active member of club or organization: Not on file    Attends meetings of clubs or organizations: Not on file    Relationship status: Not on file  . Intimate partner  violence:    Fear of current or ex partner: Not on file    Emotionally abused: Not on file    Physically abused: Not on file    Forced sexual activity: Not on file  Other Topics Concern  . Not on file  Social History Narrative   She retired on disability   Married   2 Children    REVIEW OF SYSTEMS: Constitutional: No fevers, chills, or sweats, no generalized fatigue, change in appetite Eyes: No visual changes, double vision, eye pain Ear, nose and throat: No hearing loss, ear pain, nasal congestion, sore throat Cardiovascular: No chest pain, palpitations Respiratory:  No shortness of breath at rest or with exertion, wheezes GastrointestinaI: No nausea, vomiting, diarrhea, abdominal pain, fecal incontinence Genitourinary:  No dysuria, urinary retention or frequency Musculoskeletal:  No neck pain, back pain Integumentary: No rash, pruritus, skin lesions Neurological: as above Psychiatric: No depression, insomnia, anxiety Endocrine: No palpitations, fatigue, diaphoresis, mood swings, change in appetite, change in weight, increased thirst Hematologic/Lymphatic:  No purpura, petechiae. Allergic/Immunologic: no itchy/runny eyes, nasal congestion, recent allergic reactions, rashes  PHYSICAL EXAM: Vitals:   07/15/17 1105  BP: (!) 90/58  Pulse: 94  SpO2: 97%   General: No acute distress.  Patient appears well-groomed.  thin body habitus. Head:  Normocephalic/atraumatic Eyes:  Fundi examined but not visualized Neck: supple, no paraspinal tenderness, full range of motion Heart:  Regular rate and rhythm Lungs:  Clear to auscultation bilaterally Back: No paraspinal tenderness Neurological Exam: alert and oriented to person, place, and time. Attention span and concentration intact, recent and remote memory intact, fund of knowledge intact.  Speech fluent and not dysarthric, language intact.  CN II-XII intact. Bulk and tone normal, muscle strength 5/5 throughout.  Sensation to pinprick  and vibration reduced in feet.  Deep tendon reflexes trace throughout.  Finger to nose testing intact.  Gait normal, Romberg negative.  IMPRESSION: Migraine without aura, not intractable Chronic tension-type headache Peripheral neuropathy, idiopathic versus diabetic  PLAN: 1.  Initiate tizanidine 38m, titrating to 3 times daily for daily headache.  Would rather avoid nortriptyline as she is on Mirbetriq. 2.  Cymbalta and Lyrica for neuropathic pain 3.  [facility]  Follow up in 3 months.  15 minutes spent face to face with patient, over 50% spent discussing management.  AMetta Clines DO  CC: EDelman Cheadle MD

## 2017-07-15 NOTE — Patient Instructions (Signed)
1.  Start tizandine 2mg .  Take 1 tablet at bedtime for 7 days, then 1 tablet twice daily for 7 days, then 1 tablet 3 times daily 2.  Follow up in 3 months.

## 2017-08-02 ENCOUNTER — Other Ambulatory Visit: Payer: Self-pay

## 2017-08-02 ENCOUNTER — Ambulatory Visit (INDEPENDENT_AMBULATORY_CARE_PROVIDER_SITE_OTHER): Payer: Medicare Other | Admitting: Family Medicine

## 2017-08-02 ENCOUNTER — Encounter: Payer: Self-pay | Admitting: Family Medicine

## 2017-08-02 VITALS — BP 102/68 | HR 95 | Temp 98.6°F | Resp 16 | Ht 64.96 in | Wt 108.0 lb

## 2017-08-02 DIAGNOSIS — R110 Nausea: Secondary | ICD-10-CM | POA: Diagnosis not present

## 2017-08-02 LAB — POCT CBC
Granulocyte percent: 60.7 %G (ref 37–80)
HCT, POC: 38.2 % (ref 37.7–47.9)
Hemoglobin: 12.2 g/dL (ref 12.2–16.2)
Lymph, poc: 1.8 (ref 0.6–3.4)
MCH, POC: 29.9 pg (ref 27–31.2)
MCHC: 32.1 g/dL (ref 31.8–35.4)
MCV: 93.2 fL (ref 80–97)
MID (cbc): 0.4 (ref 0–0.9)
MPV: 6.8 fL (ref 0–99.8)
POC Granulocyte: 3.4 (ref 2–6.9)
POC LYMPH PERCENT: 32.8 %L (ref 10–50)
POC MID %: 6.5 %M (ref 0–12)
Platelet Count, POC: 281 10*3/uL (ref 142–424)
RBC: 4.1 M/uL (ref 4.04–5.48)
RDW, POC: 13.9 %
WBC: 5.6 10*3/uL (ref 4.6–10.2)

## 2017-08-02 LAB — POCT URINALYSIS DIP (MANUAL ENTRY)
Bilirubin, UA: NEGATIVE
Blood, UA: NEGATIVE
Glucose, UA: NEGATIVE mg/dL
Leukocytes, UA: NEGATIVE
Nitrite, UA: NEGATIVE
Protein Ur, POC: NEGATIVE mg/dL
Spec Grav, UA: >=1.03 — AB (ref 1.010–1.025)
Urobilinogen, UA: 1 E.U./dL
pH, UA: 5 (ref 5.0–8.0)

## 2017-08-02 LAB — POC MICROSCOPIC URINALYSIS (UMFC): Mucus: ABSENT

## 2017-08-02 NOTE — Progress Notes (Addendum)
Subjective:    Patient ID: Kayla [name], female    DOB: 1945-06-23, 72 y.o.   MRN: [id number] Chief Complaint  Patient presents with  . Nausea    pt states she has been feeling nausea x 2 weeks everytime she eats     HPI Last endocrinology OV was with Dr. Renato Shin 11/18/2016. She last saw Dr. Tomi Likens on 7/2 and he started tizanidine 72m titrating up to tid but she misunderstood so is currently taking 2 tabs po qhs.   Bowels are difficult to start but then become loose and water likely diarrhea which is a change from her chronic constipation resulting in anal fissues Probiotics helped resolve constipation but caused to much gas. We tried movantik fo rher constipation cause cramping on an empty stomach.  She is eating very small amounts and everyting is getting stuch in her hiatal hernia so then she is having ot put her fingers down her throat ot get it out.  She finally get her breath test from Dr. GCarlean Purl- lactulose hydrogen for small intestinal bacterial overgrowth.  He thinkgs she has a chronic function disorder and disturbances related to previous suegeryies.   Past Medical History:  Diagnosis Date  . Allergy   . Anal fissure 10/08/2016  . Anemia    anemia in the past  . Angina    takes Propanolol prn and it stops her chest  . Anxiety   . Barrett esophagus    not consistently present  . Cataract   . Chronic kidney disease    kidney stones and UTI  . Complication of anesthesia    either  BP or pulse dropped with last surgery  . DDD (degenerative disc disease)    cervical and lumbar  . Dementia   . Depression    post traumatic stress  disorder  . [place]  . Diabetes mellitus    sugar goes up and down diet controlled  . Dysrhythmia    brought on by stress  . External hemorrhoids   . Fatigue   . Fibromyalgia    neuropathy knees ankles and toes, bladder  . GERD (gastroesophageal reflux disease)   . H/O hyperparathyroidism   . Headache(784.0)     migraines  . Hiatal hernia   . History of kidney stones   . Hypertension    3 small brain aneurysms  . Hypoglycemia 11/08/2016  . IBS (irritable bowel syndrome)   . Internal hemorrhoids   . Macular degeneration   . Migraines   . Mitral regurgitation 12/11/2015  . Osteoarthritis    all over  . Osteopetrosis   . Peripheral vascular disease (HCC)    superficial phlebitis in left calf  . Personal history of colonic polyps 07/22/2013   07/2013 - 3 small adenomas - repeat colonoscopy 2018   Past Surgical History:  Procedure Laterality Date  . ABDOMINAL HYSTERECTOMY    . APPENDECTOMY    . CARDIAC CATHETERIZATION  03/22/1991   EF 61%  . CARDIOVASCULAR STRESS TEST  10/03/2006  . CHOLECYSTECTOMY    . COLONOSCOPY  06/23/2008   normal  . DILATION AND CURETTAGE OF UTERUS     x2  . ESOPHAGUS SURGERY    . EXPLORATORY LAPAROTOMY  1993  . GASTRIC BYPASS  1999  . GASTRIC RESTRICTION SURGERY  1991  . Right Arm Surgery    . Right Knee Arthroscopy    . Rt wrist fx  2009  . stomach stappeling  1991  . STONE  EXTRACTION WITH BASKET  2012  . TONSILLECTOMY    . TUBAL LIGATION    . UPPER GASTROINTESTINAL ENDOSCOPY  06/23/2008   w/Dil, Barrett's esophagus  . US ECHOCARDIOGRAPHY  01/21/2007   EF 55-60%  . US ECHOCARDIOGRAPHY  08/30/2003   EF 55-60%   Current Outpatient Medications on File Prior to Visit  Medication Sig Dispense Refill  . acarbose (PRECOSE) 25 MG tablet Take 1 tablet (25 mg total) by mouth 3 (three) times daily with meals. 90 tablet 5  . Ascorbic Acid (VITAMIN C) 1000 MG tablet Take 1,000 mg by mouth daily.    Marland Kitchen aspirin-acetaminophen-caffeine (EXCEDRIN MIGRAINE) 250-250-65 MG tablet Take 2 tablets by mouth 2 (two) times daily.    . beta carotene w/minerals (OCUVITE) tablet Take 1 tablet by mouth 2 (two) times daily.    . Cholecalciferol (VITAMIN D3) 2000 UNITS TABS Take 2,000 Units by mouth daily.    . clonazePAM (KLONOPIN) 1 MG tablet Take 0.5-1 tablets (0.5-1 mg total) by mouth 2  (two) times daily as needed for anxiety. 180 tablet 1  . Continuous Blood Gluc Sensor (FREESTYLE LIBRE 14 DAY SENSOR) MISC 1 Units by Does not apply route every 14 (fourteen) days. 6 each 5  . Cyanocobalamin (VITAMIN B-12) 1000 MCG SUBL Place 5,000 mcg under the tongue daily.     . cyclobenzaprine (FLEXERIL) 10 MG tablet Take 1 tablet (10 mg total) by mouth 3 (three) times daily as needed for muscle spasms. 270 tablet 1  . cycloSPORINE (RESTASIS) 0.05 % ophthalmic emulsion Place 1 drop into both eyes 2 (two) times daily. 5.5 mL 5  . diclofenac sodium (VOLTAREN) 1 % GEL Apply 2 g topically 4 (four) times daily. 700 g 3  . dicyclomine (BENTYL) 10 MG capsule Take 1-2 tab 3 times daily AC as needed for spasms and cramping. 270 capsule 1  . diltiazem 2 % GEL Apply 1 application topically 2 (two) times daily as needed (pain from fissure). 30 g 2  . docusate sodium (COLACE) 250 MG capsule Take 1 capsule (250 mg total) by mouth daily. 90 capsule 3  . DULoxetine (CYMBALTA) 30 MG capsule Take 1 capsule (30 mg total) by mouth 2 (two) times daily. 180 capsule 3  . esomeprazole (NEXIUM) 40 MG capsule Take 1 capsule (40 mg total) by mouth daily before breakfast. 90 capsule 3  . fentaNYL (DURAGESIC - DOSED MCG/HR) 50 MCG/HR Place 50 mcg onto the skin every 3 (three) days.    . furosemide (LASIX) 40 MG tablet Take 1 tablet (40 mg total) by mouth daily as needed for edema. 90 tablet 0  . glucose blood test strip Use to check cbgs 6 times daily as directed by physician. Testing freq: hypoglycemia, hyperglycemia. ICD10: R73.03, E 16.2 600 each 5  . HYDROcodone-acetaminophen (NORCO) 7.5-325 MG tablet Take 1 tablet every 6 (six) hours as needed by mouth for moderate pain.    . hypromellose (SYSTANE OVERNIGHT THERAPY) 0.3 % GEL ophthalmic ointment Place 1 drop into both eyes at bedtime. Reported on 02/11/2015    . IRON PO Take by mouth.    Marland Kitchen L-Methylfolate-Algae-B12-B6 (METANX) 3-90.314-2-35 MG CAPS Take 1 capsule 2 (two)  times daily by mouth.    . lidocaine (LIDODERM) 5 % Place 1 patch onto the skin as needed. Remove & Discard patch within 12 hours. May dispense as 3 month supply 90 patch 3  . Lifitegrast (XIIDRA) 5 % SOLN Place 1 drop into both eyes daily.     . meloxicam (  MOBIC) 15 MG tablet Take 1 tablet (15 mg total) by mouth daily. Prn pain 90 tablet 1  . metroNIDAZOLE (METROGEL) 1 % gel Apply topically daily. 45 g 0  . mirabegron ER (MYRBETRIQ) 25 MG TB24 tablet Take 1 tablet (25 mg total) by mouth daily. 90 tablet 3  . mupirocin ointment (BACTROBAN) 2 % Apply 1 application topically 3 (three) times daily. 30 g 1  . naloxegol oxalate (MOVANTIK) 25 MG TABS tablet Take 1 tablet (25 mg total) by mouth daily. Take 1 hr before or 2 hrs after meal 90 tablet 1  . Naloxone HCl 0.4 MG/0.4ML SOAJ Administer 0.65m [place] at first sign of opioid overdose and repeat every 2 minutes as needed for resuscitation. Call 911 immediately 5 Package 0  . Nerve Stimulator (CEFALY KIT) DEVI 20 Minutes by Does not apply route as directed. 1 Device 0  . NON FORMULARY [facility]  Peripheral Neuropathy Cream- Bupivacaine 1%, Doxepin 3%, Gabapentin 6%, Pentoxifylline 3%, Topiramate 1% Apply 1-2 grams to affected area 3-4 times daily Qty. 120 gm 3 refills    . ondansetron (ZOFRAN) 8 MG tablet Take 1 tablet (8 mg total) by mouth every 8 (eight) hours as needed for nausea or vomiting. 60 tablet 0  . ONE TOUCH LANCETS MISC Use to check cbgs 6 times daily as directed by physician. Testing freq: hypoglycemia, hyperglycemia. ICD10: R73.03, E 16.2 600 each 5  . potassium chloride SA (K-DUR,KLOR-CON) 20 MEQ tablet Take 1 tablet (20 mEq total) by mouth daily as needed (only take along with the furosemide). 90 tablet 0  . pregabalin (LYRICA) 50 MG capsule Take 1 capsule (50 mg total) by mouth 3 (three) times daily. (Patient taking differently: Take 50 mg by mouth 2 (two) times daily. ) 90 capsule 3  . Probiotic Product (PROBIOTIC-10 PO) Take  by mouth.     . pyridoxine (B-6) 200 MG tablet Take 200 mg by mouth daily.    . SUMAtriptan (IMITREX) 100 MG tablet Take 1 tablet at earliest onset of headache, may repeat in 2 hours if headache persists or reoccurs. (Patient taking differently: Take 100 mg by mouth every 3 (three) days. ) 30 tablet 1  . tizanidine (ZANAFLEX) 2 MG capsule Take 1 capsule (2 mg total) by mouth 3 (three) times daily. 90 capsule 3  . tiZANidine (ZANAFLEX) 2 MG tablet Take 1 tab at bedtime for 7 days, then 1 tab twice daily for 7 days, then 1 tab three times daily 90 tablet 0  . traZODone (DESYREL) 100 MG tablet Take 0.5-1 tablets (50-100 mg total) by mouth at bedtime as needed for sleep. And 1/2 -1 if wakes at 4am. 90 tablet 1  . valACYclovir (VALTREX) 1000 MG tablet Take 1 tablet (1,000 mg total) by mouth 3 (three) times daily. 21 tablet 0  . Vitamin D, Ergocalciferol, (DRISDOL) 50000 units CAPS capsule Take 1 capsule (50,000 Units total) by mouth every 7 (seven) days. 12 capsule 0  . [DISCONTINUED] buPROPion (WELLBUTRIN) 75 MG tablet Take 75 mg by mouth daily after breakfast.      No current facility-administered medications on file prior to visit.    Allergies  Allergen Reactions  . Ambien [Zolpidem Tartrate]     Hallucinations  . Codeine Anaphylaxis  . Oxycodone-Acetaminophen Shortness Of Breath  . Tylox [Oxycodone-Acetaminophen] Anaphylaxis    Chest pain  . Darvocet [Propoxyphene N-Acetaminophen]     Chest pain  . Darvon [Propoxyphene] Itching  . Lorcet [Hydrocodone-Acetaminophen]     Chest pain  .  Opium     hallucinations  . Vicodin [Hydrocodone-Acetaminophen] Other (See Comments)    Chest Pain   . Wheat Bran   . Amoxicillin Nausea And Vomiting    Reaction:  Migraine headache  . Penicillins Nausea And Vomiting    Migraine Headaches   Family History  Problem Relation Age of Onset  . Stroke Mother   . Lung cancer Mother   . Heart disease Mother   . Diabetes Mother   . Hypertension Father   .  Stroke Father   . Lung cancer Father   . Heart disease Father   . Kidney disease Father   . Seizures Father        epilepsy, and sisters x 2  . Pancreatic cancer Sister   . Lung cancer Sister   . Colon cancer Maternal Aunt        12 relatives  . Uterine cancer Unknown        aunt  . Heart disease Unknown        grandmother  . Clotting disorder Sister   . Heart disease Sister        x 2  . Kidney disease Sister        x 2  . Breast cancer Sister   . Colon cancer Paternal Aunt   . Colon cancer Paternal Aunt    Social History   Socioeconomic History  . Marital status: Married    Spouse name: Rushie Chestnut.  . Number of children: 2  . Years of education: Not on file  . Highest education level: Not on file  Occupational History  . Occupation: Disability    Employer: RETIRED  Social Needs  . Financial resource strain: Not on file  . Food insecurity:    Worry: Not on file    Inability: Not on file  . Transportation needs:    Medical: Not on file    Non-medical: Not on file  Tobacco Use  . Smoking status: Never Smoker  . Smokeless tobacco: Never Used  Substance and Sexual Activity  . Alcohol use: No  . Drug use: No  . Sexual activity: Not Currently  Lifestyle  . Physical activity:    Days per week: Not on file    Minutes per session: Not on file  . Stress: Not on file  Relationships  . Social connections:    Talks on phone: Not on file    Gets together: Not on file    Attends religious service: Not on file    Active member of club or organization: Not on file    Attends meetings of clubs or organizations: Not on file    Relationship status: Not on file  Other Topics Concern  . Not on file  Social History Narrative   She retired on disability   Married   2 Children   Depression screen [facility] 2/9 08/02/2017 06/23/2017 04/21/2017 03/19/2017 03/13/2017  Decreased Interest 0 0 0 0 0  Down, Depressed, Hopeless 0 0 0 0 0  PHQ - 2 Score 0 0 0 0 0  Altered sleeping - - - - -    Tired, decreased energy - - - - -  Change in appetite - - - - -  Feeling bad or failure about yourself  - - - - -  Trouble concentrating - - - - -  Moving slowly or fidgety/restless - - - - -  Suicidal thoughts - - - - -  PHQ-9 Score - - - - -  Difficult doing work/chores - - - - -  Some recent data might be hidden      Review of Systems     Objective:   Physical Exam    BP 102/68   Pulse 95   Temp 98.6 F (37 C) (Oral)   Resp 16   Ht 5' 4.96" (1.65 m)   Wt 108 lb (49 kg)   SpO2 98%   BMI 17.99 kg/m      Assessment & Plan:   1. Nausea without vomiting     Orders Placed This Encounter  Procedures  . Comprehensive metabolic panel  . Lipase  . POCT CBC  . POCT urinalysis dipstick  . POCT Microscopic Urinalysis ([facility])      [name], M.D.  Primary Care at [facility] [address] [place], [place] [address] [phone] phone [phone] fax  08/02/17 3:40 PM

## 2017-08-02 NOTE — Patient Instructions (Signed)
     IF you received an x-ray today, you will receive an invoice from [facility]. Please contact [facility] at [phone] with questions or concerns regarding your invoice.   IF you received labwork today, you will receive an invoice from [facility]. Please contact [facility] at [phone] with questions or concerns regarding your invoice.   Our billing staff will not be able to assist you with questions regarding bills from these companies.  You will be contacted with the lab results as soon as they are available. The fastest way to get your results is to activate your My Chart account. Instructions are located on the last page of this paperwork. If you have not heard from us regarding the results in 2 weeks, please contact this office.     

## 2017-08-03 LAB — LIPASE: LIPASE: 23 U/L (ref 14–72)

## 2017-08-03 LAB — COMPREHENSIVE METABOLIC PANEL
ALT: 38 IU/L — ABNORMAL HIGH (ref 0–32)
AST: 28 IU/L (ref 0–40)
Albumin/Globulin Ratio: 2.1 (ref 1.2–2.2)
Albumin: 4.4 g/dL (ref 3.6–4.8)
Alkaline Phosphatase: 92 IU/L (ref 39–117)
BUN/Creatinine Ratio: 24 (ref 12–28)
BUN: 22 mg/dL (ref 8–27)
Bilirubin Total: 0.3 mg/dL (ref 0.0–1.2)
CO2: 29 mmol/L (ref 20–29)
Calcium: 9.6 mg/dL (ref 8.7–10.3)
Chloride: 103 mmol/L (ref 96–106)
Creatinine, Ser: 0.9 mg/dL (ref 0.57–1.00)
GFR calc Af Amer: 75 mL/min/{1.73_m2} (ref >59)
GFR calc non Af Amer: 65 mL/min/{1.73_m2} (ref >59)
Globulin, Total: 2.1 g/dL (ref 1.5–4.5)
Glucose: 102 mg/dL — ABNORMAL HIGH (ref 65–99)
Potassium: 5.1 mmol/L (ref 3.5–5.2)
Sodium: 143 mmol/L (ref 134–144)
Total Protein: 6.5 g/dL (ref 6.0–8.5)

## 2017-08-05 ENCOUNTER — Telehealth: Payer: Self-pay | Admitting: Internal Medicine

## 2017-08-05 ENCOUNTER — Encounter: Payer: Self-pay | Admitting: Internal Medicine

## 2017-08-05 ENCOUNTER — Telehealth: Payer: Self-pay | Admitting: Family Medicine

## 2017-08-05 DIAGNOSIS — K638219 Small intestinal bacterial overgrowth, unspecified: Secondary | ICD-10-CM

## 2017-08-05 DIAGNOSIS — K6389 Other specified diseases of intestine: Secondary | ICD-10-CM

## 2017-08-05 HISTORY — DX: Small intestinal bacterial overgrowth, unspecified: K63.8219

## 2017-08-05 HISTORY — DX: Other specified diseases of intestine: K63.89

## 2017-08-05 MED ORDER — RIFAXIMIN 550 MG PO TABS: 550.0000 mg | ORAL_TABLET | Freq: Three times a day (TID) | ORAL | 0 refills | Status: DC

## 2017-08-05 NOTE — Telephone Encounter (Signed)
Copied from [place]. Topic: Quick Communication - See Telephone Encounter >> Aug 05, 2017  5:41 PM Rutherford Nail, NT wrote: CRM for notification. See Telephone encounter for: 08/05/17. Patient calling and states that she would like to leave a message for Dr Brigitte Pulse. States that she is shocked. Dr Brigitte Pulse saw her a few days ago and requested her to see her GI. States that she had saw Dr Carlean Purl a a few months ago and turned in a bowel culture a couple months ago. States that their office has had the results for at least 2 months and no one called and told her that she was positive for small bowel bacteria. States that she spoke with someone in their office today and was prescribed Xifasan 550mg , but her copay is $470 and paying for medication would be $1200. She is going to call and see if there is an alternate medication, but she just wanted to give an update on the issue. CB#: [phone]

## 2017-08-05 NOTE — Telephone Encounter (Signed)
Yes - sorry for delay  She has a test that indicates SIBO  Xifaxan 550 mg tid x 14 days Rx please  She should make a follow-up next available

## 2017-08-05 NOTE — Telephone Encounter (Signed)
You ordered a breath test on patient last September. She just did it a month or so ago.  Have you seen the results?

## 2017-08-05 NOTE — Telephone Encounter (Signed)
Patient notified rx sent 

## 2017-08-05 NOTE — Telephone Encounter (Signed)
She had follow up on 09/01/17 previously scheduled

## 2017-08-06 ENCOUNTER — Telehealth: Payer: Self-pay | Admitting: Internal Medicine

## 2017-08-06 ENCOUNTER — Telehealth: Payer: Self-pay | Admitting: Family Medicine

## 2017-08-06 MED ORDER — RIFAXIMIN 550 MG PO TABS: 550.0000 mg | ORAL_TABLET | Freq: Three times a day (TID) | ORAL | 0 refills | Status: DC

## 2017-08-06 NOTE — Telephone Encounter (Signed)
Pt called stating that copay for xifaxan is over $400. [facility] [place] and [facility] it. Pt is requesting to send prescription over there.

## 2017-08-06 NOTE — Telephone Encounter (Signed)
Copied from [place] [id number]. Topic: General - Other >> Aug 06, 2017  1:50 PM Cecelia Byars, NT wrote: Reason for CRM: The patient called and sad she will get her medication from [facility] and they are going to get her medication for free .

## 2017-08-06 NOTE — Telephone Encounter (Signed)
Please Advise

## 2017-08-06 NOTE — Telephone Encounter (Signed)
Have her make an October f/u

## 2017-08-06 NOTE — Telephone Encounter (Signed)
Patient informed that xifaxan resent as requested to pharmacy in [place]. She said it will be free but will take 3 weeks to get the medicine. I will let Dr Carlean Purl know.

## 2017-08-07 NOTE — Telephone Encounter (Signed)
That works!

## 2017-08-07 NOTE — Telephone Encounter (Signed)
Patient informed ok to keep that appointment.

## 2017-08-07 NOTE — Telephone Encounter (Signed)
Patient has an August 19th appointment and wants to keep it because of her symptoms.

## 2017-08-12 ENCOUNTER — Other Ambulatory Visit: Payer: Self-pay | Admitting: Neurology

## 2017-08-12 DIAGNOSIS — H40023 Open angle with borderline findings, high risk, bilateral: Secondary | ICD-10-CM | POA: Diagnosis not present

## 2017-08-12 DIAGNOSIS — D23111 Other benign neoplasm of skin of right upper eyelid, including canthus: Secondary | ICD-10-CM | POA: Diagnosis not present

## 2017-08-12 DIAGNOSIS — R51 Headache: Secondary | ICD-10-CM | POA: Diagnosis not present

## 2017-08-12 DIAGNOSIS — H01001 Unspecified blepharitis right upper eyelid: Secondary | ICD-10-CM | POA: Diagnosis not present

## 2017-08-12 DIAGNOSIS — M47812 Spondylosis without myelopathy or radiculopathy, cervical region: Secondary | ICD-10-CM | POA: Diagnosis not present

## 2017-08-12 DIAGNOSIS — M170 Bilateral primary osteoarthritis of knee: Secondary | ICD-10-CM | POA: Diagnosis not present

## 2017-08-12 DIAGNOSIS — H01004 Unspecified blepharitis left upper eyelid: Secondary | ICD-10-CM | POA: Diagnosis not present

## 2017-08-12 DIAGNOSIS — I739 Peripheral vascular disease, unspecified: Secondary | ICD-10-CM | POA: Diagnosis not present

## 2017-08-12 DIAGNOSIS — G894 Chronic pain syndrome: Secondary | ICD-10-CM | POA: Diagnosis not present

## 2017-08-12 DIAGNOSIS — H04123 Dry eye syndrome of bilateral lacrimal glands: Secondary | ICD-10-CM | POA: Diagnosis not present

## 2017-08-12 DIAGNOSIS — H16223 Keratoconjunctivitis sicca, not specified as Sjogren's, bilateral: Secondary | ICD-10-CM | POA: Diagnosis not present

## 2017-08-12 MED ORDER — TIZANIDINE HCL 2 MG PO CAPS: 2.0000 mg | ORAL_CAPSULE | Freq: Three times a day (TID) | ORAL | 3 refills | Status: DC

## 2017-08-13 ENCOUNTER — Telehealth: Payer: Self-pay | Admitting: Neurology

## 2017-08-13 DIAGNOSIS — G43709 Chronic migraine without aura, not intractable, without status migrainosus: Secondary | ICD-10-CM

## 2017-08-13 MED ORDER — ERENUMAB-AOOE 70 MG/ML ~~LOC~~ SOAJ: 70.0000 mg | SUBCUTANEOUS | 11 refills | Status: DC

## 2017-08-13 NOTE — Telephone Encounter (Signed)
Called and spoke with Pt. She states her headaches started again 2 days after her last OV here 07/15/17. She has been taking sumatriptan incorrectly. She has been taking one tablet, and repeating in 15 minutes if headache persists. I advised her she could repeat after 2 hours if needed, no more than 2/24 or 2 days a week. She said no one ever told her that. I asked that she get the medication and read to me the instructions. The box said no more than 3 days a week, but the rest was as I had advised. Pt states she and Dr Tomi Likens talked a preventative.  I talked with Dr Tomi Likens, he recommends, CGRP.  Called Pt and advised of CGRP, that it is a once monthly injection. She said to submit to [facility]

## 2017-08-13 NOTE — Telephone Encounter (Signed)
She also said her Appt with [facility] has been pushed back to September. Thanks

## 2017-08-13 NOTE — Telephone Encounter (Signed)
Patient called regarding there options for treatment for her Headaches. She said she has her medication but she said the headaches came back terrible. Please Call. She said they started right after she saw Dr. Tomi Likens on 07/15/2017. Please Call. Thanks

## 2017-08-14 ENCOUNTER — Encounter: Payer: Self-pay | Admitting: Family Medicine

## 2017-08-14 ENCOUNTER — Telehealth: Payer: Self-pay | Admitting: Neurology

## 2017-08-14 ENCOUNTER — Encounter: Payer: Self-pay | Admitting: Internal Medicine

## 2017-08-14 ENCOUNTER — Ambulatory Visit: Payer: Medicare Other | Admitting: Family Medicine

## 2017-08-14 NOTE — Telephone Encounter (Signed)
Patient called regarding her Injections [facility]ines [facility] or through [facility]. She has not heard anything. Please Call. Thanks

## 2017-08-14 NOTE — Telephone Encounter (Signed)
Copied from [place] [id number]. Topic: Quick Communication - See Telephone Encounter >> Aug 14, 2017  9:29 AM Synthia Innocent wrote: CRM for notification. See Telephone encounter for: 08/14/17.FYI Dr Tomi Likens order migraine shots, should get soon. Very sorry she missed appt today, thought it was 08/18/17. Dr Carlean Purl prescribe antibiotics, for intestinal infection.

## 2017-08-14 NOTE — Progress Notes (Deleted)
   Subjective:    Patient ID: Kayla [name], female    DOB: 1945/11/15, 72 y.o.   MRN: [id number]  HPI    Review of Systems     Objective:   Physical Exam        Assessment & Plan:

## 2017-08-14 NOTE — Telephone Encounter (Signed)
noted 

## 2017-08-15 ENCOUNTER — Telehealth: Payer: Self-pay | Admitting: Family Medicine

## 2017-08-15 NOTE — Telephone Encounter (Signed)
Called and spoke with pt to advise them of their appt on 08/21/17 with Dr. Brigitte Pulse is going to be in the [address]. Advised of the time, building number and late policy.

## 2017-08-19 ENCOUNTER — Telehealth: Payer: Self-pay | Admitting: Neurology

## 2017-08-19 NOTE — Telephone Encounter (Signed)
Patient called and said that she is needing to know if her [facility] are [facility] or [place]?  Please Call. Thanks

## 2017-08-19 NOTE — Telephone Encounter (Signed)
Called Pt, advised her Rx was sent to [facility] on 08/13/17

## 2017-08-21 ENCOUNTER — Encounter: Payer: Self-pay | Admitting: Family Medicine

## 2017-08-21 ENCOUNTER — Other Ambulatory Visit: Payer: Self-pay

## 2017-08-21 ENCOUNTER — Ambulatory Visit (INDEPENDENT_AMBULATORY_CARE_PROVIDER_SITE_OTHER): Payer: Medicare Other | Admitting: Family Medicine

## 2017-08-21 VITALS — BP 106/68 | HR 88 | Temp 98.0°F | Resp 16 | Ht 65.75 in | Wt 109.0 lb

## 2017-08-21 DIAGNOSIS — K2100 Gastro-esophageal reflux disease with esophagitis, without bleeding: Secondary | ICD-10-CM

## 2017-08-21 DIAGNOSIS — M797 Fibromyalgia: Secondary | ICD-10-CM | POA: Diagnosis not present

## 2017-08-21 DIAGNOSIS — M1990 Unspecified osteoarthritis, unspecified site: Secondary | ICD-10-CM

## 2017-08-21 DIAGNOSIS — R634 Abnormal weight loss: Secondary | ICD-10-CM | POA: Diagnosis not present

## 2017-08-21 DIAGNOSIS — L719 Rosacea, unspecified: Secondary | ICD-10-CM | POA: Diagnosis not present

## 2017-08-21 DIAGNOSIS — K6389 Other specified diseases of intestine: Secondary | ICD-10-CM | POA: Diagnosis not present

## 2017-08-21 DIAGNOSIS — G894 Chronic pain syndrome: Secondary | ICD-10-CM

## 2017-08-21 DIAGNOSIS — K5909 Other constipation: Secondary | ICD-10-CM | POA: Diagnosis not present

## 2017-08-21 NOTE — Patient Instructions (Signed)
     IF you received an x-ray today, you will receive an invoice from [facility]. Please contact [facility] at [phone] with questions or concerns regarding your invoice.   IF you received labwork today, you will receive an invoice from [facility]. Please contact [facility] at [phone] with questions or concerns regarding your invoice.   Our billing staff will not be able to assist you with questions regarding bills from these companies.  You will be contacted with the lab results as soon as they are available. The fastest way to get your results is to activate your My Chart account. Instructions are located on the last page of this paperwork. If you have not heard from us regarding the results in 2 weeks, please contact this office.     

## 2017-08-21 NOTE — Progress Notes (Signed)
Subjective:    Patient ID: Kayla [name], female    DOB: 10-08-1945, 72 y.o.   MRN: [id number] Chief Complaint  Patient presents with  . Nausea    pt states she feels much better after last OV but still have some eposoids     HPI Still on cyclobenzaprine bid (rx'd tied) but going to swit6ch to the tizanidine after then ausea goes away.  Just started eating again and having less cramping Eating salt to keep BNP up   Past Medical History:  Diagnosis Date  . Allergy   . Anal fissure 10/08/2016  . Anemia    anemia in the past  . Angina    takes Propanolol prn and it stops her chest  . Anxiety   . Barrett esophagus    not consistently present  . Cataract   . Chronic kidney disease    kidney stones and UTI  . Complication of anesthesia    either  BP or pulse dropped with last surgery  . DDD (degenerative disc disease)    cervical and lumbar  . Dementia ([facility])   . Depression    post traumatic stress  disorder  . Diabetes ([place]) 11/18/2016  . Diabetes mellitus    sugar goes up and down diet controlled  . Dysrhythmia    brought on by stress  . External hemorrhoids   . Fatigue   . Fibromyalgia    neuropathy knees ankles and toes, bladder  . GERD (gastroesophageal reflux disease)   . H/O hyperparathyroidism   . Headache(784.0)    migraines  . Hiatal hernia   . History of kidney stones   . Hypertension    3 small brain aneurysms  . Hypoglycemia 11/08/2016  . IBS (irritable bowel syndrome)   . Internal hemorrhoids   . Macular degeneration   . Migraines   . Mitral regurgitation 12/11/2015  . Osteoarthritis    all over  . Osteopetrosis   . Peripheral vascular disease (HCC)    superficial phlebitis in left calf  . Personal history of colonic polyps 07/22/2013   07/2013 - 3 small adenomas - repeat colonoscopy 2018  . Small intestinal bacterial overgrowth 08/05/2017   + lactulose breath test 06/2017 Xifaxan Rx   Past Surgical History:  Procedure Laterality Date  .  ABDOMINAL HYSTERECTOMY    . APPENDECTOMY    . CARDIAC CATHETERIZATION  03/22/1991   EF 61%  . CARDIOVASCULAR STRESS TEST  10/03/2006  . CHOLECYSTECTOMY    . COLONOSCOPY  06/23/2008   normal  . DILATION AND CURETTAGE OF UTERUS     x2  . ESOPHAGUS SURGERY    . EXPLORATORY LAPAROTOMY  1993  . GASTRIC BYPASS  1999  . GASTRIC RESTRICTION SURGERY  1991  . LASIK Bilateral   . Right Arm Surgery    . Right Knee Arthroscopy    . Rt wrist fx  2009  . stomach stappeling  1991  . STONE EXTRACTION WITH BASKET  2012  . TONSILLECTOMY    . TUBAL LIGATION    . UPPER GASTROINTESTINAL ENDOSCOPY  06/23/2008   w/Dil, Barrett's esophagus  . US ECHOCARDIOGRAPHY  01/21/2007   EF 55-60%  . US ECHOCARDIOGRAPHY  08/30/2003   EF 55-60%   Current Outpatient Medications on File Prior to Visit  Medication Sig Dispense Refill  . aspirin-acetaminophen-caffeine (EXCEDRIN MIGRAINE) 250-250-65 MG tablet Take 2 tablets by mouth 2 (two) times daily.    . clonazePAM (KLONOPIN) 1 MG tablet Take 0.5-1 tablets (0.5-1  mg total) by mouth 2 (two) times daily as needed for anxiety. 180 tablet 1  . Continuous Blood Gluc Sensor (FREESTYLE LIBRE 14 DAY SENSOR) MISC 1 Units by Does not apply route every 14 (fourteen) days. 6 each 5  . cyclobenzaprine (FLEXERIL) 10 MG tablet Take 1 tablet (10 mg total) by mouth 3 (three) times daily as needed for muscle spasms. 270 tablet 1  . cycloSPORINE (RESTASIS) 0.05 % ophthalmic emulsion Place 1 drop into both eyes 2 (two) times daily. 5.5 mL 5  . diclofenac sodium (VOLTAREN) 1 % GEL Apply 2 g topically 4 (four) times daily. 700 g 3  . dicyclomine (BENTYL) 10 MG capsule Take 1-2 tab 3 times daily AC as needed for spasms and cramping. 270 capsule 1  . diltiazem 2 % GEL Apply 1 application topically 2 (two) times daily as needed (pain from fissure). 30 g 2  . docusate sodium (COLACE) 250 MG capsule Take 1 capsule (250 mg total) by mouth daily. 90 capsule 3  . Erenumab-aooe (AIMOVIG) 70 MG/ML SOAJ  Inject 70 mg into the skin every 30 (thirty) days. 1 pen 11  . esomeprazole (NEXIUM) 40 MG capsule Take 1 capsule (40 mg total) by mouth daily before breakfast. 90 capsule 3  . fentaNYL (DURAGESIC - DOSED MCG/HR) 50 MCG/HR Place 50 mcg onto the skin every 3 (three) days.    . furosemide (LASIX) 40 MG tablet Take 1 tablet (40 mg total) by mouth daily as needed for edema. 90 tablet 0  . HYDROcodone-acetaminophen (NORCO) 7.5-325 MG tablet Take 1 tablet every 6 (six) hours as needed by mouth for moderate pain.    . hypromellose (SYSTANE OVERNIGHT THERAPY) 0.3 % GEL ophthalmic ointment Place 1 drop into both eyes at bedtime. Reported on 02/11/2015    . IRON PO Take by mouth.    . lidocaine (LIDODERM) 5 % Place 1 patch onto the skin as needed. Remove & Discard patch within 12 hours. May dispense as 3 month supply 90 patch 3  . Lifitegrast (XIIDRA) 5 % SOLN Place 1 drop into both eyes daily.     . meloxicam (MOBIC) 15 MG tablet Take 1 tablet (15 mg total) by mouth daily. Prn pain 90 tablet 1  . metroNIDAZOLE (METROGEL) 1 % gel Apply topically daily. 45 g 0  . mupirocin ointment (BACTROBAN) 2 % Apply 1 application topically 3 (three) times daily. 30 g 1  . Naloxone HCl 0.4 MG/0.4ML SOAJ Administer 0.54m [place] at first sign of opioid overdose and repeat every 2 minutes as needed for resuscitation. Call 911 immediately 5 Package 0  . Nerve Stimulator (CEFALY KIT) DEVI 20 Minutes by Does not apply route as directed. 1 Device 0  . NON FORMULARY [facility]  Peripheral Neuropathy Cream- Bupivacaine 1%, Doxepin 3%, Gabapentin 6%, Pentoxifylline 3%, Topiramate 1% Apply 1-2 grams to affected area 3-4 times daily Qty. 120 gm 3 refills    . ondansetron (ZOFRAN) 8 MG tablet Take 1 tablet (8 mg total) by mouth every 8 (eight) hours as needed for nausea or vomiting. 60 tablet 0  . ONE TOUCH LANCETS MISC Use to check cbgs 6 times daily as directed by physician. Testing freq: hypoglycemia, hyperglycemia. ICD10:  R73.03, E 16.2 600 each 5  . potassium chloride SA (K-DUR,KLOR-CON) 20 MEQ tablet Take 1 tablet (20 mEq total) by mouth daily as needed (only take along with the furosemide). 90 tablet 0  . pregabalin (LYRICA) 50 MG capsule Take 1 capsule (50 mg total) by  mouth 3 (three) times daily. (Patient taking differently: Take 50 mg by mouth 2 (two) times daily. ) 90 capsule 3  . Probiotic Product (PROBIOTIC-10 PO) Take by mouth.     . SUMAtriptan (IMITREX) 100 MG tablet Take 1 tablet at earliest onset of headache, may repeat in 2 hours if headache persists or reoccurs. (Patient taking differently: Take 100 mg by mouth every 3 (three) days. ) 30 tablet 1  . traZODone (DESYREL) 100 MG tablet Take 0.5-1 tablets (50-100 mg total) by mouth at bedtime as needed for sleep. And 1/2 -1 if wakes at 4am. 90 tablet 1  . valACYclovir (VALTREX) 1000 MG tablet Take 1 tablet (1,000 mg total) by mouth 3 (three) times daily. 21 tablet 0  . [DISCONTINUED] buPROPion (WELLBUTRIN) 75 MG tablet Take 75 mg by mouth daily after breakfast.      No current facility-administered medications on file prior to visit.    Allergies  Allergen Reactions  . Ambien [Zolpidem Tartrate]     Hallucinations  . Codeine Anaphylaxis  . Oxycodone-Acetaminophen Shortness Of Breath  . Tylox [Oxycodone-Acetaminophen] Anaphylaxis    Chest pain  . Darvocet [Propoxyphene N-Acetaminophen]     Chest pain  . Darvon [Propoxyphene] Itching  . Lorcet [Hydrocodone-Acetaminophen]     Chest pain  . Opium     hallucinations  . Vicodin [Hydrocodone-Acetaminophen] Other (See Comments)    Chest Pain   . Wheat Bran   . Amoxicillin Nausea And Vomiting    Reaction:  Migraine headache  . Penicillins Nausea And Vomiting    Migraine Headaches   Family History  Problem Relation Age of Onset  . Stroke Mother   . Lung cancer Mother   . Heart disease Mother   . Diabetes Mother   . Hypertension Father   . Stroke Father   . Lung cancer Father   . Heart  disease Father   . Kidney disease Father   . Seizures Father        epilepsy, and sisters x 2  . Pancreatic cancer Sister   . Lung cancer Sister   . Colon cancer Maternal Aunt        12 relatives  . Uterine cancer Unknown        aunt  . Heart disease Unknown        grandmother  . Clotting disorder Sister   . Heart disease Sister        x 2  . Kidney disease Sister        x 2  . Breast cancer Sister   . Colon cancer Paternal Aunt   . Colon cancer Paternal Aunt    Social History   Socioeconomic History  . Marital status: Married    Spouse name: Rushie Chestnut.  . Number of children: 2  . Years of education: Not on file  . Highest education level: Not on file  Occupational History  . Occupation: Disability    Employer: RETIRED  Social Needs  . Financial resource strain: Not on file  . Food insecurity:    Worry: Not on file    Inability: Not on file  . Transportation needs:    Medical: Not on file    Non-medical: Not on file  Tobacco Use  . Smoking status: Never Smoker  . Smokeless tobacco: Never Used  Substance and Sexual Activity  . Alcohol use: No  . Drug use: No  . Sexual activity: Not Currently  Lifestyle  . Physical activity:  Days per week: Not on file    Minutes per session: Not on file  . Stress: Not on file  Relationships  . Social connections:    Talks on phone: Not on file    Gets together: Not on file    Attends religious service: Not on file    Active member of club or organization: Not on file    Attends meetings of clubs or organizations: Not on file    Relationship status: Not on file  Other Topics Concern  . Not on file  Social History Narrative   She retired on disability   Married   2 Children   Depression screen [facility] 2/9 08/21/2017 08/02/2017 06/23/2017 04/21/2017 03/19/2017  Decreased Interest 0 0 0 0 0  Down, Depressed, Hopeless 0 0 0 0 0  PHQ - 2 Score 0 0 0 0 0  Altered sleeping - - - - -  Tired, decreased energy - - - - -  Change in  appetite - - - - -  Feeling bad or failure about yourself  - - - - -  Trouble concentrating - - - - -  Moving slowly or fidgety/restless - - - - -  Suicidal thoughts - - - - -  PHQ-9 Score - - - - -  Difficult doing work/chores - - - - -  Some recent data might be hidden     Review of Systems See hpi    Objective:   Physical Exam  Constitutional: She is oriented to person, place, and time. She appears well-developed and well-nourished. No distress.  HENT:  Head: Normocephalic and atraumatic.  Right Ear: External ear normal.  Left Ear: External ear normal.  Eyes: Conjunctivae are normal. No scleral icterus.  Neck: Normal range of motion. Neck supple. No thyromegaly present.  Cardiovascular: Normal rate, regular rhythm, normal heart sounds and intact distal pulses.  Pulmonary/Chest: Effort normal and breath sounds normal. No respiratory distress.  Musculoskeletal: She exhibits no edema.  Lymphadenopathy:    She has no cervical adenopathy.  Neurological: She is alert and oriented to person, place, and time.  Skin: Skin is warm and dry. She is not diaphoretic. No erythema.  Psychiatric: She has a normal mood and affect. Her behavior is normal.   BP 106/68   Pulse 88   Temp 98 F (36.7 C) (Oral)   Resp 16   Ht 5' 5.75" (1.67 m)   Wt 109 lb (49.4 kg)   SpO2 96%   BMI 17.73 kg/m      Assessment & Plan:  Ok to refill dicyclomine, nexium when needed. Ok to refill lidocaine patches wheen needed. Uese metrogel every day on her face every day for rosacea  1. Gastroesophageal reflux disease with esophagitis   2. Chronic constipation   3. Small intestinal bacterial overgrowth   4. Other type of osteoarthritis, unspecified site   5. WEIGHT LOSS   6. Fibromyalgia   7. Rosacea   8. Chronic pain syndrome      [name], MD, MPH [facility] [address] [place], [place]  [address] [phone] Office phone  [phone] Office  fax   11/02/17 3:56 AM

## 2017-09-01 ENCOUNTER — Encounter

## 2017-09-01 ENCOUNTER — Ambulatory Visit (INDEPENDENT_AMBULATORY_CARE_PROVIDER_SITE_OTHER): Payer: Medicare Other | Admitting: Internal Medicine

## 2017-09-01 ENCOUNTER — Encounter: Payer: Self-pay | Admitting: Internal Medicine

## 2017-09-01 VITALS — BP 92/58 | HR 64 | Ht 65.0 in | Wt 103.0 lb

## 2017-09-01 DIAGNOSIS — R634 Abnormal weight loss: Secondary | ICD-10-CM | POA: Diagnosis not present

## 2017-09-01 DIAGNOSIS — K6389 Other specified diseases of intestine: Secondary | ICD-10-CM | POA: Diagnosis not present

## 2017-09-01 DIAGNOSIS — R1110 Vomiting, unspecified: Secondary | ICD-10-CM

## 2017-09-01 DIAGNOSIS — K5903 Drug induced constipation: Secondary | ICD-10-CM

## 2017-09-01 DIAGNOSIS — R1319 Other dysphagia: Secondary | ICD-10-CM

## 2017-09-01 DIAGNOSIS — T402X5A Adverse effect of other opioids, initial encounter: Secondary | ICD-10-CM

## 2017-09-01 DIAGNOSIS — R1310 Dysphagia, unspecified: Secondary | ICD-10-CM | POA: Diagnosis not present

## 2017-09-01 MED ORDER — RANITIDINE HCL 300 MG PO CAPS: 300.0000 mg | ORAL_CAPSULE | Freq: Every day | ORAL | 3 refills | Status: DC

## 2017-09-01 MED ORDER — LUBIPROSTONE 24 MCG PO CAPS: 24.0000 ug | ORAL_CAPSULE | Freq: Two times a day (BID) | ORAL | 3 refills | Status: DC

## 2017-09-01 NOTE — Progress Notes (Signed)
Kayla [name] 72 y.o. 1945/02/14 [id number]  Assessment & Plan:   Encounter Diagnoses  Name Primary?  . Esophageal dysphagia Yes  . Regurgitation of food   . Small intestinal bacterial overgrowth   . Constipation due to opioid therapy   . Loss of weight     UGI series to investigate dysphagia and regurgitation Amitiza 24 mcg twice daily to see if it helps her constipation. Ranitidine qhs to try to help her regurg and reflux symptoms  Further plans pending the above   Subjective:   Chief Complaint: Abdominal pain regurgitation/dysphagia and weight loss  HPI Kayla [name] is here after treatment with Xifaxan for a positive breath test.  She continues with some bloating and abdominal pain but thinks it might have helped.  She says she is aspirating intermittently and having coughing with green phlegm and she says her husband wonders if she might have pneumonia.  No fever.  She has had some intermittent regurgitation left lower and left upper quadrant pains and takes 1 or 2 Vicodin a day and feels that she is constipated on that moving her bowels about once a week.  She will regurgitate and have reflux when she is sleeping at times she says the head of the bed is up and she does not eat close to bedtime.  She is having difficulty eating because of this she is concerned and fearful of her problems and at one point she had a blood sugar of 52 because she was not eating enough.  Wt Readings from Last 3 Encounters:  09/01/17 103 lb (46.7 kg)  08/21/17 109 lb (49.4 kg)  08/02/17 108 lb (49 kg)    Allergies  Allergen Reactions  . Ambien [Zolpidem Tartrate]     Hallucinations  . Codeine Anaphylaxis  . Oxycodone-Acetaminophen Shortness Of Breath  . Tylox [Oxycodone-Acetaminophen] Anaphylaxis    Chest pain  . Darvocet [Propoxyphene N-Acetaminophen]     Chest pain  . Darvon [Propoxyphene] Itching  . Lorcet [Hydrocodone-Acetaminophen]     Chest pain  . Opium     hallucinations  .  Vicodin [Hydrocodone-Acetaminophen] Other (See Comments)    Chest Pain   . Wheat Bran   . Amoxicillin Nausea And Vomiting    Reaction:  Migraine headache  . Penicillins Nausea And Vomiting    Migraine Headaches   Current Meds  Medication Sig  . acarbose (PRECOSE) 25 MG tablet Take 1 tablet (25 mg total) by mouth 3 (three) times daily with meals.  . Ascorbic Acid (VITAMIN C) 1000 MG tablet Take 1,000 mg by mouth daily.  Marland Kitchen aspirin-acetaminophen-caffeine (EXCEDRIN MIGRAINE) 250-250-65 MG tablet Take 2 tablets by mouth 2 (two) times daily.  . beta carotene w/minerals (OCUVITE) tablet Take 1 tablet by mouth 2 (two) times daily.  . Cholecalciferol (VITAMIN D3) 2000 UNITS TABS Take 2,000 Units by mouth daily.  . clonazePAM (KLONOPIN) 1 MG tablet Take 0.5-1 tablets (0.5-1 mg total) by mouth 2 (two) times daily as needed for anxiety.  . Continuous Blood Gluc Sensor (FREESTYLE LIBRE 14 DAY SENSOR) MISC 1 Units by Does not apply route every 14 (fourteen) days.  . Cyanocobalamin (VITAMIN B-12) 1000 MCG SUBL Place 5,000 mcg under the tongue daily.   . cyclobenzaprine (FLEXERIL) 10 MG tablet Take 1 tablet (10 mg total) by mouth 3 (three) times daily as needed for muscle spasms.  . cycloSPORINE (RESTASIS) 0.05 % ophthalmic emulsion Place 1 drop into both eyes 2 (two) times daily.  . diclofenac sodium (VOLTAREN) 1 %  GEL Apply 2 g topically 4 (four) times daily.  Marland Kitchen dicyclomine (BENTYL) 10 MG capsule Take 1-2 tab 3 times daily AC as needed for spasms and cramping.  . diltiazem 2 % GEL Apply 1 application topically 2 (two) times daily as needed (pain from fissure).  Marland Kitchen docusate sodium (COLACE) 250 MG capsule Take 1 capsule (250 mg total) by mouth daily.  . DULoxetine (CYMBALTA) 30 MG capsule Take 1 capsule (30 mg total) by mouth 2 (two) times daily.  Eduard Roux (AIMOVIG) 70 MG/ML SOAJ Inject 70 mg into the skin every 30 (thirty) days.  Marland Kitchen esomeprazole (NEXIUM) 40 MG capsule Take 1 capsule (40 mg total) by  mouth daily before breakfast.  . fentaNYL (DURAGESIC - DOSED MCG/HR) 50 MCG/HR Place 50 mcg onto the skin every 3 (three) days.  . furosemide (LASIX) 40 MG tablet Take 1 tablet (40 mg total) by mouth daily as needed for edema.  Marland Kitchen HYDROcodone-acetaminophen (NORCO) 7.5-325 MG tablet Take 1 tablet every 6 (six) hours as needed by mouth for moderate pain.  . hypromellose (SYSTANE OVERNIGHT THERAPY) 0.3 % GEL ophthalmic ointment Place 1 drop into both eyes at bedtime. Reported on 02/11/2015  . IRON PO Take by mouth.  Marland Kitchen L-Methylfolate-Algae-B12-B6 (METANX) 3-90.314-2-35 MG CAPS Take 1 capsule 2 (two) times daily by mouth.  . lidocaine (LIDODERM) 5 % Place 1 patch onto the skin as needed. Remove & Discard patch within 12 hours. May dispense as 3 month supply  . Lifitegrast (XIIDRA) 5 % SOLN Place 1 drop into both eyes daily.   . meloxicam (MOBIC) 15 MG tablet Take 1 tablet (15 mg total) by mouth daily. Prn pain  . metroNIDAZOLE (METROGEL) 1 % gel Apply topically daily.  . mirabegron ER (MYRBETRIQ) 25 MG TB24 tablet Take 1 tablet (25 mg total) by mouth daily.  . mupirocin ointment (BACTROBAN) 2 % Apply 1 application topically 3 (three) times daily.  . Naloxone HCl 0.4 MG/0.4ML SOAJ Administer 0.39m [place] at first sign of opioid overdose and repeat every 2 minutes as needed for resuscitation. Call 911 immediately  . Nerve Stimulator (CEFALY KIT) DEVI 20 Minutes by Does not apply route as directed.  .Salley ScarletFORMULARY [facility]  Peripheral Neuropathy Cream- Bupivacaine 1%, Doxepin 3%, Gabapentin 6%, Pentoxifylline 3%, Topiramate 1% Apply 1-2 grams to affected area 3-4 times daily Qty. 120 gm 3 refills  . ondansetron (ZOFRAN) 8 MG tablet Take 1 tablet (8 mg total) by mouth every 8 (eight) hours as needed for nausea or vomiting.  . ONE TOUCH LANCETS MISC Use to check cbgs 6 times daily as directed by physician. Testing freq: hypoglycemia, hyperglycemia. ICD10: R73.03, E 16.2  . potassium chloride SA  (K-DUR,KLOR-CON) 20 MEQ tablet Take 1 tablet (20 mEq total) by mouth daily as needed (only take along with the furosemide).  . pregabalin (LYRICA) 50 MG capsule Take 1 capsule (50 mg total) by mouth 3 (three) times daily. (Patient taking differently: Take 50 mg by mouth 2 (two) times daily. )  . Probiotic Product (PROBIOTIC-10 PO) Take by mouth.   . pyridoxine (B-6) 200 MG tablet Take 200 mg by mouth daily.  . SUMAtriptan (IMITREX) 100 MG tablet Take 1 tablet at earliest onset of headache, may repeat in 2 hours if headache persists or reoccurs. (Patient taking differently: Take 100 mg by mouth every 3 (three) days. )  . traZODone (DESYREL) 100 MG tablet Take 0.5-1 tablets (50-100 mg total) by mouth at bedtime as needed for sleep. And 1/2 -1  if wakes at 4am.  . valACYclovir (VALTREX) 1000 MG tablet Take 1 tablet (1,000 mg total) by mouth 3 (three) times daily.   Past Medical History:  Diagnosis Date  . Allergy   . Anal fissure 10/08/2016  . Anemia    anemia in the past  . Angina    takes Propanolol prn and it stops her chest  . Anxiety   . Barrett esophagus    not consistently present  . Cataract   . Chronic kidney disease    kidney stones and UTI  . Complication of anesthesia    either  BP or pulse dropped with last surgery  . DDD (degenerative disc disease)    cervical and lumbar  . Dementia   . Depression    post traumatic stress  disorder  . Diabetes ([facility]) 11/18/2016  . Diabetes mellitus    sugar goes up and down diet controlled  . Dysrhythmia    brought on by stress  . External hemorrhoids   . Fatigue   . Fibromyalgia    neuropathy knees ankles and toes, bladder  . GERD (gastroesophageal reflux disease)   . H/O hyperparathyroidism   . Headache(784.0)    migraines  . Hiatal hernia   . History of kidney stones   . Hypertension    3 small brain aneurysms  . Hypoglycemia 11/08/2016  . IBS (irritable bowel syndrome)   . Internal hemorrhoids   . Macular degeneration   .  Migraines   . Mitral regurgitation 12/11/2015  . Osteoarthritis    all over  . Osteopetrosis   . Peripheral vascular disease (H[facility])    superficial phlebitis in left calf  . Personal history of colonic polyps 07/22/2013   07/2013 - 3 small adenomas - repeat colonoscopy 2018  . Small intestinal bacterial overgrowth 08/05/2017   + lactulose breath test 06/2017 Xifaxan Rx   Past Surgical History:  Procedure Laterality Date  . ABDOMINAL HYSTERECTOMY    . APPENDECTOMY    . CARDIAC CATHETERIZATION  03/22/1991   EF 61%  . CARDIOVASCULAR STRESS TEST  10/03/2006  . CHOLECYSTECTOMY    . COLONOSCOPY  06/23/2008   normal  . DILATION AND CURETTAGE OF UTERUS     x2  . ESOPHAGUS SURGERY    . EXPLORATORY LAPAROTOMY  1993  . GASTRIC BYPASS  1999  . GASTRIC RESTRICTION SURGERY  1991  . LASIK Bilateral   . Right Arm Surgery    . Right Knee Arthroscopy    . Rt wrist fx  2009  . stomach stappeling  1991  . STONE EXTRACTION WITH BASKET  2012  . TONSILLECTOMY    . TUBAL LIGATION    . UPPER GASTROINTESTINAL ENDOSCOPY  06/23/2008   w/Dil, Barrett's esophagus  . US ECHOCARDIOGRAPHY  01/21/2007   EF 55-60%  . US ECHOCARDIOGRAPHY  08/30/2003   EF 55-60%   Social History   Social History Narrative   She retired on disability   Married   2 Children   family history includes Breast cancer in her sister; Clotting disorder in her sister; Colon cancer in her maternal aunt, paternal aunt, and paternal aunt; Diabetes in her mother; Heart disease in her father, mother, sister, and unknown relative; Hypertension in her father; Kidney disease in her father and sister; Lung cancer in her father, mother, and sister; Pancreatic cancer in her sister; Seizures in her father; Stroke in her father and mother; Uterine cancer in her unknown relative.   Review of Systems As per  HPI  Objective:   Physical Exam '@BP'  (!) 92/58   Pulse 64   Ht '5\' 5"'  (1.651 m)   Wt 103 lb (46.7 kg)   BMI 17.14 kg/m @  General:    Well-developed, well-nourished and in no acute distress Eyes:  anicteric. ENT:   Mouth and posterior pharynx free of lesions.  Lungs: Clear to auscultation bilaterally. Heart:   S1S2, no rubs, murmurs, gallops. Abdomen:  soft, non-tender, mildly protuberantno hepatosplenomegaly, hernia, or mass and BS+.  Neuro:  A&O x 3.  Psych:  appropriate mood and  Affect.   Data Reviewed: As per HPI

## 2017-09-01 NOTE — Patient Instructions (Addendum)
Use Miralax while you are waiting on your medicine to come from the mail order.   We have sent the following prescriptions to your mail in pharmacy:  lubiprostone  If you have not heard from your mail in pharmacy within 1 week or if you have not received your medication in the mail, please contact us at [phone] so we may find out why.   We have sent the following medications to your pharmacy for you to pick up at your convenience: Ranitidine   You have been scheduled for an Upper GI Series at [place]. Your appointment is on 09/03/17 at 10:00AM. Please arrive 20 minutes prior to your test for registration. Make sure not to eat or drink anything after midnight on the night before your test. If you need to reschedule, please call radiology at [phone]. ________________________________________________________________ An upper GI series uses x rays to help diagnose problems of the upper GI tract, which includes the esophagus, stomach, and duodenum. The duodenum is the first part of the small intestine. An upper GI series is conducted by a radiology technologist or a radiologist-a doctor who specializes in x-ray imaging-at a hospital or outpatient center. While sitting or standing in front of an x-ray machine, the patient drinks barium liquid, which is often white and has a chalky consistency and taste. The barium liquid coats the lining of the upper GI tract and makes signs of disease show up more clearly on x rays. X-ray video, called fluoroscopy, is used to view the barium liquid moving through the esophagus, stomach, and duodenum. Additional x rays and fluoroscopy are performed while the patient lies on an x-ray table. To fully coat the upper GI tract with barium liquid, the technologist or radiologist may press on the abdomen or ask the patient to change position. Patients hold still in various positions, allowing the technologist or radiologist to take x rays of the upper GI  tract at different angles. If a technologist conducts the upper GI series, a radiologist will later examine the images to look for problems.  This test typically takes about 1 hour to complete. __________________________________________________________________   I appreciate the opportunity to care for you. Silvano Rusk, MD, [facility]

## 2017-09-03 ENCOUNTER — Other Ambulatory Visit: Payer: Self-pay

## 2017-09-05 ENCOUNTER — Ambulatory Visit
Admission: RE | Admit: 2017-09-05 | Discharge: 2017-09-05 | Disposition: A | Discharge status: Home / Self Care | Payer: Medicare Other | Source: Ambulatory Visit | Attending: Internal Medicine | Admitting: Internal Medicine

## 2017-09-05 ENCOUNTER — Other Ambulatory Visit: Payer: Self-pay | Admitting: Internal Medicine

## 2017-09-05 DIAGNOSIS — R1310 Dysphagia, unspecified: Secondary | ICD-10-CM

## 2017-09-05 DIAGNOSIS — R1110 Vomiting, unspecified: Secondary | ICD-10-CM

## 2017-09-05 DIAGNOSIS — R1319 Other dysphagia: Secondary | ICD-10-CM

## 2017-09-07 NOTE — Progress Notes (Signed)
My chart message Re: reflux

## 2017-09-09 DIAGNOSIS — H04123 Dry eye syndrome of bilateral lacrimal glands: Secondary | ICD-10-CM | POA: Diagnosis not present

## 2017-09-09 DIAGNOSIS — H16223 Keratoconjunctivitis sicca, not specified as Sjogren's, bilateral: Secondary | ICD-10-CM | POA: Diagnosis not present

## 2017-09-09 DIAGNOSIS — H01001 Unspecified blepharitis right upper eyelid: Secondary | ICD-10-CM | POA: Diagnosis not present

## 2017-09-09 DIAGNOSIS — H353131 Nonexudative age-related macular degeneration, bilateral, early dry stage: Secondary | ICD-10-CM | POA: Diagnosis not present

## 2017-09-09 DIAGNOSIS — H02052 Trichiasis without entropian right lower eyelid: Secondary | ICD-10-CM | POA: Diagnosis not present

## 2017-09-09 DIAGNOSIS — H01002 Unspecified blepharitis right lower eyelid: Secondary | ICD-10-CM | POA: Diagnosis not present

## 2017-09-18 ENCOUNTER — Telehealth: Payer: Self-pay | Admitting: Internal Medicine

## 2017-09-18 NOTE — Telephone Encounter (Signed)
Patient did not see the [facility] message.  She reports that amitiza did not help.  She was started on BID iron and has required miralx and pro-biotics along with the amitiza with no relief.  Reflux symptoms about the same.

## 2017-09-18 NOTE — Telephone Encounter (Signed)
Stop Amitiza if not helping  Would do a Miralax purge by taking 4 dulcolax, waith 1-2 hrs and drink 4 doses of MiraLAx in 1- 2 hours  Ongoing Tx would do 2 doses MiraLAx daily and if no BM in 48 hours take 1-2 dulcolax to have BM and keep taking daily until bowel movement and then use it prn in same fashion but stay on the MiraLAx 2 each day

## 2017-09-19 NOTE — Telephone Encounter (Signed)
Patient notified of the recommendations. She verbalized understanding of the instructions.

## 2017-09-24 ENCOUNTER — Other Ambulatory Visit: Payer: Self-pay | Admitting: Endocrinology

## 2017-09-25 ENCOUNTER — Telehealth: Payer: Self-pay | Admitting: Family Medicine

## 2017-09-25 NOTE — Telephone Encounter (Signed)
Pt has been rsc to 11-18-17

## 2017-09-25 NOTE — Telephone Encounter (Signed)
Called pt. To reschedule their visit with Dr. Brigitte Pulse on 10/23/17. Left VM to call back. If pt. Calls back please reschedule

## 2017-10-02 ENCOUNTER — Telehealth: Payer: Self-pay | Admitting: Family Medicine

## 2017-10-02 NOTE — Telephone Encounter (Signed)
Cymbalta refill Last Refill:06/2817 # 180 Last OV: Unknown PCP: Brigitte Pulse [id number]  Mobic refill Last Refill:07/11/17 # 10 Last OV: unknown PCP: Brigitte Pulse [id number]   Myrebetriq25refill Last Refill:11/08/16 # 4 Last OV: unknown PCP: [name] [id number]

## 2017-10-02 NOTE — Telephone Encounter (Signed)
Copied from [place] [phone] [id number]. Topic: Quick Communication - Rx Refill/Question >> Oct 02, 2017 11:08 AM Oliver Pila B wrote: Medication: DULoxetine (CYMBALTA) 30 MG capsule [[id number]]  meloxicam (MOBIC) 15 MG tablet [[id number]]  mirabegron ER (MYRBETRIQ) 25 MG TB24 tablet [[id number]]   Has the patient contacted their pharmacy? Yes.   (Agent: If no, request that the patient contact the pharmacy for the refill.) (Agent: If yes, when and what did the pharmacy advise?)  [facility]  Agent: Please be advised that RX refills may take up to 3 business days. We ask that you follow-up with your pharmacy.

## 2017-10-07 ENCOUNTER — Other Ambulatory Visit: Payer: Self-pay | Admitting: *Deleted

## 2017-10-07 MED ORDER — MIRABEGRON ER 25 MG PO TB24: 25.0000 mg | ORAL_TABLET | Freq: Every day | ORAL | 0 refills | Status: DC

## 2017-10-08 DIAGNOSIS — G609 Hereditary and idiopathic neuropathy, unspecified: Secondary | ICD-10-CM | POA: Diagnosis not present

## 2017-10-17 DIAGNOSIS — H01001 Unspecified blepharitis right upper eyelid: Secondary | ICD-10-CM | POA: Diagnosis not present

## 2017-10-17 DIAGNOSIS — H40023 Open angle with borderline findings, high risk, bilateral: Secondary | ICD-10-CM | POA: Diagnosis not present

## 2017-10-17 DIAGNOSIS — H04123 Dry eye syndrome of bilateral lacrimal glands: Secondary | ICD-10-CM | POA: Diagnosis not present

## 2017-10-17 DIAGNOSIS — H01004 Unspecified blepharitis left upper eyelid: Secondary | ICD-10-CM | POA: Diagnosis not present

## 2017-10-22 ENCOUNTER — Ambulatory Visit (INDEPENDENT_AMBULATORY_CARE_PROVIDER_SITE_OTHER): Payer: Medicare Other | Admitting: Neurology

## 2017-10-22 ENCOUNTER — Encounter: Payer: Self-pay | Admitting: Neurology

## 2017-10-22 VITALS — BP 118/70 | HR 90 | Ht 65.0 in | Wt 110.0 lb

## 2017-10-22 DIAGNOSIS — R6889 Other general symptoms and signs: Secondary | ICD-10-CM | POA: Diagnosis not present

## 2017-10-22 DIAGNOSIS — R790 Abnormal level of blood mineral: Secondary | ICD-10-CM | POA: Diagnosis not present

## 2017-10-22 DIAGNOSIS — G609 Hereditary and idiopathic neuropathy, unspecified: Secondary | ICD-10-CM | POA: Diagnosis not present

## 2017-10-22 DIAGNOSIS — G43009 Migraine without aura, not intractable, without status migrainosus: Secondary | ICD-10-CM

## 2017-10-22 DIAGNOSIS — E678 Other specified hyperalimentation: Secondary | ICD-10-CM

## 2017-10-22 MED ORDER — DULOXETINE HCL 30 MG PO CPEP: 90.0000 mg | ORAL_CAPSULE | Freq: Every day | ORAL | 5 refills | Status: DC

## 2017-10-22 NOTE — Patient Instructions (Addendum)
1.  Increase Cymbalta to 90mg  daily 2.  Continue Aimovig. 3.  We will refer you to pain management 4.  We will recheck copper and B6 in 3 months 5.  Follow up in 5 months.

## 2017-10-22 NOTE — Progress Notes (Signed)
NEUROLOGY FOLLOW UP OFFICE NOTE  FRANCISCO EYERLY [id number]  HISTORY OF PRESENT ILLNESS: Renley Gutman is a 72 year old woman with fibromyalgia, anxiety, degenerative disc disease of cervical and lumbar spine, and history of nephrolithiasis and gastric bypass who follows up for idiopathic polyneuropathy and migraine.  UPDATE: Neuropathy: She went to [facility] for evaluation of neuropathy.  Selenium, Zinc, paraneoplastic panel and genetic testing Copper was low, so she was advised to start supplement.  B6 was still elevated despite stopping supplements 3 months ago.  Pain is still a significant issue.  Her pain doctor recently left.  Migraine: No migraines since last visit. Current NSAIDS:  Mobic Current analgesics:  Excedrin Migraine, Lidoderm, voltaren 1%, Fentanyl patch Current triptans:  Sumatriptan 155m Current ergotamine:  no Current anti-emetic:  no Current muscle relaxants:  Flexeril Current anti-anxiolytic:  clonazepam Current sleep aide:  trazodone Current Antihypertensive medications:  Lasix Current Antidepressant medications:  Cymbalta 375mtwice daily, Lyrica 5025mwice daily (three times daily caused confusion) Current Anticonvulsant medications:  No Current anti-CGRP:  Aimovig 68m15mrrent Vitamins/Herbal/Supplements:  None Current Antihistamines/Decongestants:  no Other therapy:  Physical therapy, [facility]  Caffeine:  2 cups of coffee daily Alcohol:  no Smoker:  no Diet:  poor Exercise:  no Depression:  no; Anxiety:  yes Other pain:  no Sleep hygiene:  poor  HISTORY: I  Neuropathy:  Since July 2018, she has had increased swelling and burning numbness and tingling in the legs.  She had lower extremity vascular ABIs on 08/20/16, which was negative.  She has history of B12 deficiency but she takes injections and recent B12 level from 09/21/16 was over 2000.  Methylmalonic acid level was 209, RPR nonreactive, homocysteine 7.8.  Labs from 07/12/16 include normal SPEP, Sed  Rate 2, CRP less than 0.3, and TSH 2.430 .  Vitamin D was 23 and she was advised to start supplementation.  Serum glucose has been 70s up to 110.  She also takes B6.  She also has history of weight loss.  She has fibromyalgia and was previously diagnosed with neuropathy by another neurologist.  She has been on gabapentin for many years.  It was briefly discontinued to see if it was contributing to the swelling.  She reported no significant change, so it was restarted (she takes 300mg60mimes daily), although she thinks it helped a little bit.  NCV-EMG from 10/21/16 demonstrated chronic sensorimotor polyneuropathy of the predominantly axonal type as well as mild left median neuropathy at or distal to the wrist.  Other labs include B1 146.1, B6 10.7, Sed Rate 2, HIV negative, TSH 1.780, UPEP negative.   She is wondering if her neuropathy could be secondary to AgentNortheast Utilitiessure.  Her husband was a [place] and reports cases of spouses exposed to Agent[place]ortheast Utilitiesugh their husband's semen.  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  Her neuropathic symptoms started shortly after her return.  [place] [facility] [place].   II Migraine: Onset:  Since 1970, after her twin newborns passed away.  She has lived with life-long family-related stress Location:  Migraines are unilateral/parietal or bilateral retro-orbital, chronic tension type (bifrontal) Quality:  Severe crushing Initial Intensity:  10/10 Aura:  no Prodrome:  no Associated symptoms:  Photophobia, phonophobia.  No nausea, vomiting or visual disturbance Initial Duration:  Migraines 1 hour with sumatriptan, other headaches 15 minutes with Fioricet Initial Frequency:  Daily (3 to 4  days a month are severe migraine) Triggers/exacerbating factors:  Stress, Nexium Relieving factors:  Clonazepam, Fioricet, sumatriptan Activity:  Cannot function when severe (3-4 days per  month)  Past NSAIDS:  Ibuprofen, naproxen Past analgesics:  Cafergot, Excedrin, Tylenol, Tramadol, Fioricet Past abortive triptans:  no Past muscle relaxants:  tizanidine Past anti-emetic:  Zofran ODT 5m Past antihypertensive medications:  Lopressor, propranolol Past antidepressant medications:  Elavil, Effexor, Zoloft Past anticonvulsant medications:  Topiramate, gabapentin 3075mtwice daily Past vitamins/Herbal/Supplements:  CoQ10 Other past therapies:  yoga  Family history of headache:  Father, sister, brother  MRI and MRA of brain from 07/23/11 were personally reviewed and unremarkable.  PAST MEDICAL HISTORY: Past Medical History:  Diagnosis Date  . Allergy   . Anal fissure 10/08/2016  . Anemia    anemia in the past  . Angina    takes Propanolol prn and it stops her chest  . Anxiety   . Barrett esophagus    not consistently present  . Cataract   . Chronic kidney disease    kidney stones and UTI  . Complication of anesthesia    either  BP or pulse dropped with last surgery  . DDD (degenerative disc disease)    cervical and lumbar  . Dementia   . Depression    post traumatic stress  disorder  . [place]  . Diabetes mellitus    sugar goes up and down diet controlled  . Dysrhythmia    brought on by stress  . External hemorrhoids   . Fatigue   . Fibromyalgia    neuropathy knees ankles and toes, bladder  . GERD (gastroesophageal reflux disease)   . H/O hyperparathyroidism   . Headache(784.0)    migraines  . Hiatal hernia   . History of kidney stones   . Hypertension    3 small brain aneurysms  . Hypoglycemia 11/08/2016  . IBS (irritable bowel syndrome)   . Internal hemorrhoids   . Macular degeneration   . Migraines   . Mitral regurgitation 12/11/2015  . Osteoarthritis    all over  . Osteopetrosis   . Peripheral vascular disease (HCC)    superficial phlebitis in left calf  . Personal history of colonic polyps 07/22/2013   07/2013 - 3  small adenomas - repeat colonoscopy 2018  . Small intestinal bacterial overgrowth 08/05/2017   + lactulose breath test 06/2017 Xifaxan Rx    MEDICATIONS: Current Outpatient Medications on File Prior to Visit  Medication Sig Dispense Refill  . acarbose (PRECOSE) 25 MG tablet Take 1 tablet (25 mg total) by mouth 3 (three) times daily with meals. 90 tablet 5  . Ascorbic Acid (VITAMIN C) 1000 MG tablet Take 1,000 mg by mouth daily.    . Marland Kitchenspirin-acetaminophen-caffeine (EXCEDRIN MIGRAINE) 250-250-65 MG tablet Take 2 tablets by mouth 2 (two) times daily.    . beta carotene w/minerals (OCUVITE) tablet Take 1 tablet by mouth 2 (two) times daily.    . Cholecalciferol (VITAMIN D3) 2000 UNITS TABS Take 2,000 Units by mouth daily.    . clonazePAM (KLONOPIN) 1 MG tablet Take 0.5-1 tablets (0.5-1 mg total) by mouth 2 (two) times daily as needed for anxiety. 180 tablet 1  . Continuous Blood Gluc Sensor (FREESTYLE LIBRE 14 DAY SENSOR) MISC 1 Units by Does not apply route every 14 (fourteen) days. 6 each 5  . Cyanocobalamin (VITAMIN B-12) 1000 MCG SUBL Place 5,000 mcg under the tongue daily.     . cyclobenzaprine (FLEXERIL) 10 MG tablet Take  1 tablet (10 mg total) by mouth 3 (three) times daily as needed for muscle spasms. 270 tablet 1  . cycloSPORINE (RESTASIS) 0.05 % ophthalmic emulsion Place 1 drop into both eyes 2 (two) times daily. 5.5 mL 5  . diclofenac sodium (VOLTAREN) 1 % GEL Apply 2 g topically 4 (four) times daily. 700 g 3  . dicyclomine (BENTYL) 10 MG capsule Take 1-2 tab 3 times daily AC as needed for spasms and cramping. 270 capsule 1  . diltiazem 2 % GEL Apply 1 application topically 2 (two) times daily as needed (pain from fissure). 30 g 2  . docusate sodium (COLACE) 250 MG capsule Take 1 capsule (250 mg total) by mouth daily. 90 capsule 3  . DULoxetine (CYMBALTA) 30 MG capsule Take 1 capsule (30 mg total) by mouth 2 (two) times daily. 180 capsule 3  . Erenumab-aooe (AIMOVIG) 70 MG/ML SOAJ Inject  70 mg into the skin every 30 (thirty) days. 1 pen 11  . esomeprazole (NEXIUM) 40 MG capsule Take 1 capsule (40 mg total) by mouth daily before breakfast. 90 capsule 3  . fentaNYL (DURAGESIC - DOSED MCG/HR) 50 MCG/HR Place 50 mcg onto the skin every 3 (three) days.    . furosemide (LASIX) 40 MG tablet Take 1 tablet (40 mg total) by mouth daily as needed for edema. 90 tablet 0  . HYDROcodone-acetaminophen (NORCO) 7.5-325 MG tablet Take 1 tablet every 6 (six) hours as needed by mouth for moderate pain.    . hypromellose (SYSTANE OVERNIGHT THERAPY) 0.3 % GEL ophthalmic ointment Place 1 drop into both eyes at bedtime. Reported on 02/11/2015    . IRON PO Take by mouth.    Marland Kitchen L-Methylfolate-Algae-B12-B6 (METANX) 3-90.314-2-35 MG CAPS Take 1 capsule 2 (two) times daily by mouth.    . lidocaine (LIDODERM) 5 % Place 1 patch onto the skin as needed. Remove & Discard patch within 12 hours. May dispense as 3 month supply 90 patch 3  . Lifitegrast (XIIDRA) 5 % SOLN Place 1 drop into both eyes daily.     Marland Kitchen lubiprostone (AMITIZA) 24 MCG capsule Take 1 capsule (24 mcg total) by mouth 2 (two) times daily with a meal. 180 capsule 3  . meloxicam (MOBIC) 15 MG tablet Take 1 tablet (15 mg total) by mouth daily. Prn pain 90 tablet 1  . metroNIDAZOLE (METROGEL) 1 % gel Apply topically daily. 45 g 0  . mirabegron ER (MYRBETRIQ) 25 MG TB24 tablet Take 1 tablet (25 mg total) by mouth daily. Office visit needed 90 tablet 0  . mupirocin ointment (BACTROBAN) 2 % Apply 1 application topically 3 (three) times daily. 30 g 1  . Naloxone HCl 0.4 MG/0.4ML SOAJ Administer 0.17m [place] at first sign of opioid overdose and repeat every 2 minutes as needed for resuscitation. Call 911 immediately 5 Package 0  . Nerve Stimulator (CEFALY KIT) DEVI 20 Minutes by Does not apply route as directed. 1 Device 0  . NON FORMULARY [facility]  Peripheral Neuropathy Cream- Bupivacaine 1%, Doxepin 3%, Gabapentin 6%, Pentoxifylline 3%, Topiramate  1% Apply 1-2 grams to affected area 3-4 times daily Qty. 120 gm 3 refills    . ondansetron (ZOFRAN) 8 MG tablet Take 1 tablet (8 mg total) by mouth every 8 (eight) hours as needed for nausea or vomiting. 60 tablet 0  . ONE TOUCH LANCETS MISC Use to check cbgs 6 times daily as directed by physician. Testing freq: hypoglycemia, hyperglycemia. ICD10: R73.03, E 16.2 600 each 5  . potassium  chloride SA (K-DUR,KLOR-CON) 20 MEQ tablet Take 1 tablet (20 mEq total) by mouth daily as needed (only take along with the furosemide). 90 tablet 0  . pregabalin (LYRICA) 50 MG capsule Take 1 capsule (50 mg total) by mouth 3 (three) times daily. (Patient taking differently: Take 50 mg by mouth 2 (two) times daily. ) 90 capsule 3  . Probiotic Product (PROBIOTIC-10 PO) Take by mouth.     . pyridoxine (B-6) 200 MG tablet Take 200 mg by mouth daily.    . ranitidine (ZANTAC) 300 MG capsule Take 1 capsule (300 mg total) by mouth at bedtime. 90 capsule 3  . SUMAtriptan (IMITREX) 100 MG tablet Take 1 tablet at earliest onset of headache, may repeat in 2 hours if headache persists or reoccurs. (Patient taking differently: Take 100 mg by mouth every 3 (three) days. ) 30 tablet 1  . traZODone (DESYREL) 100 MG tablet Take 0.5-1 tablets (50-100 mg total) by mouth at bedtime as needed for sleep. And 1/2 -1 if wakes at 4am. 90 tablet 1  . valACYclovir (VALTREX) 1000 MG tablet Take 1 tablet (1,000 mg total) by mouth 3 (three) times daily. 21 tablet 0  . [DISCONTINUED] buPROPion (WELLBUTRIN) 75 MG tablet Take 75 mg by mouth daily after breakfast.      No current facility-administered medications on file prior to visit.     ALLERGIES: Allergies  Allergen Reactions  . Ambien [Zolpidem Tartrate]     Hallucinations  . Codeine Anaphylaxis  . Oxycodone-Acetaminophen Shortness Of Breath  . Tylox [Oxycodone-Acetaminophen] Anaphylaxis    Chest pain  . Darvocet [Propoxyphene N-Acetaminophen]     Chest pain  . Darvon [Propoxyphene]  Itching  . Lorcet [Hydrocodone-Acetaminophen]     Chest pain  . Opium     hallucinations  . Vicodin [Hydrocodone-Acetaminophen] Other (See Comments)    Chest Pain   . Wheat Bran   . Amoxicillin Nausea And Vomiting    Reaction:  Migraine headache  . Penicillins Nausea And Vomiting    Migraine Headaches    FAMILY HISTORY: Family History  Problem Relation Age of Onset  . Stroke Mother   . Lung cancer Mother   . Heart disease Mother   . Diabetes Mother   . Hypertension Father   . Stroke Father   . Lung cancer Father   . Heart disease Father   . Kidney disease Father   . Seizures Father        epilepsy, and sisters x 2  . Pancreatic cancer Sister   . Lung cancer Sister   . Colon cancer Maternal Aunt        12 relatives  . Uterine cancer Unknown        aunt  . Heart disease Unknown        grandmother  . Clotting disorder Sister   . Heart disease Sister        x 2  . Kidney disease Sister        x 2  . Breast cancer Sister   . Colon cancer Paternal Aunt   . Colon cancer Paternal Aunt    SOCIAL HISTORY: Social History   Socioeconomic History  . Marital status: Married    Spouse name: Rushie Chestnut.  . Number of children: 2  . Years of education: Not on file  . Highest education level: Not on file  Occupational History  . Occupation: Disability    Employer: RETIRED  Social Needs  . Financial resource strain: Not on  file  . Food insecurity:    Worry: Not on file    Inability: Not on file  . Transportation needs:    Medical: Not on file    Non-medical: Not on file  Tobacco Use  . Smoking status: Never Smoker  . Smokeless tobacco: Never Used  Substance and Sexual Activity  . Alcohol use: No  . Drug use: No  . Sexual activity: Not Currently  Lifestyle  . Physical activity:    Days per week: Not on file    Minutes per session: Not on file  . Stress: Not on file  Relationships  . Social connections:    Talks on phone: Not on file    Gets together: Not on  file    Attends religious service: Not on file    Active member of club or organization: Not on file    Attends meetings of clubs or organizations: Not on file    Relationship status: Not on file  . Intimate partner violence:    Fear of current or ex partner: Not on file    Emotionally abused: Not on file    Physically abused: Not on file    Forced sexual activity: Not on file  Other Topics Concern  . Not on file  Social History Narrative   She retired on disability   Married   2 Children    REVIEW OF SYSTEMS: Constitutional: No fevers, chills, or sweats, no generalized fatigue, change in appetite Eyes: No visual changes, double vision, eye pain Ear, nose and throat: No hearing loss, ear pain, nasal congestion, sore throat Cardiovascular: No chest pain, palpitations Respiratory:  No shortness of breath at rest or with exertion, wheezes GastrointestinaI: No nausea, vomiting, diarrhea, abdominal pain, fecal incontinence Genitourinary:  No dysuria, urinary retention or frequency Musculoskeletal:  As above Integumentary: No rash, pruritus, skin lesions Neurological: as above Psychiatric: No depression, insomnia, anxiety Endocrine: No palpitations, fatigue, diaphoresis, mood swings, change in appetite, change in weight, increased thirst Hematologic/Lymphatic:  No purpura, petechiae. Allergic/Immunologic: no itchy/runny eyes, nasal congestion, recent allergic reactions, rashes  PHYSICAL EXAM: Blood pressure 118/70, pulse 90, height '5\' 5"'  (1.651 m), weight 110 lb (49.9 kg), SpO2 97 %. General: No acute distress.  Patient appears well-groomed.   Head:  Normocephalic/atraumatic Eyes:  Fundi examined but not visualized Neck: supple, no paraspinal tenderness, full range of motion Heart:  Regular rate and rhythm Lungs:  Clear to auscultation bilaterally Back: No paraspinal tenderness Neurological Exam: alert and oriented to person, place, and time. Attention span and concentration  intact, recent and remote memory intact, fund of knowledge intact.  Speech fluent and not dysarthric, language intact.  CN II-XII intact. Bulk and tone normal, muscle strength 5/5 throughout.  Sensation to pinprick reduced in fingers and feet up to below the knees.  Vibration sensation reduced in feet.  Deep tendon reflexes 2+ throughout, toes downgoing.  Finger to nose and heel to shin testing intact.  Gait cautious, Romberg positive.  IMPRESSION: 1.  Idiopathic peripheral neuropathy 2.  Migraine without aura, without status migrainosus, not intractable  PLAN: 1.  Treatment for neuropathy is pain management.  We will refer her to a pain specialist.  In the meantime, we will increase Cymbalta to 52m daily. 2.  She will continue Aimovig 737mmonthly 3.  We will recheck B6 and copper in 3 months 4.  Limit use of pain relievers to no more than 2 days out of week to prevent risk of rebound or medication-overuse  headache. 5.  Keep headache diary 6.  Follow up in 5 months.  Metta Clines, DO  CC: [name], MD

## 2017-10-23 ENCOUNTER — Other Ambulatory Visit: Payer: Self-pay | Admitting: Endocrinology

## 2017-10-23 ENCOUNTER — Telehealth: Payer: Self-pay | Admitting: Family Medicine

## 2017-10-23 ENCOUNTER — Ambulatory Visit: Payer: Medicare Other | Admitting: Family Medicine

## 2017-10-23 NOTE — Telephone Encounter (Signed)
Request for refill has already been pended for provider review.

## 2017-10-23 NOTE — Telephone Encounter (Signed)
Copied from [place] [phone]. Topic: Quick Communication - Rx Refill/Question >> Oct 23, 2017 10:02 AM [name] wrote: Medication: acarbose (PRECOSE) 25 MG tablet   Pt states she is almost completely out of this medication and is requesting it be refilled by another provider as her PCP, Dr. Brigitte Pulse, is out of office.   Has the patient contacted their pharmacy? Yes. Advised to contact office.   Preferred Pharmacy (with phone number or street name):[id number] [place], [place] - [address] [phone] (Phone) [phone] (Fax

## 2017-10-24 ENCOUNTER — Other Ambulatory Visit: Payer: Self-pay | Admitting: Family Medicine

## 2017-10-24 NOTE — Telephone Encounter (Signed)
Call place to patient who states that she would like Dr Brigitte Pulse to refill Acarbose She is requesting [facility] but [facility] to allow the order process form [place]

## 2017-10-24 NOTE — Telephone Encounter (Signed)
Placed call left VM to return call to office. Medication requested last ordered by other provider.

## 2017-10-24 NOTE — Telephone Encounter (Signed)
Requested medication (s) are due for refill today: yes  Requested medication (s) are on the active medication list: yes  Last refill:  08/27/17 by Dr Renato Shin  ([name])  Future visit scheduled: yes  Notes to clinic:  Pt [facility] RX [facility] [place], [place]. but is [facility] to bridge the refill time.  She is about out!    Requested Prescriptions  Pending Prescriptions Disp Refills   acarbose (PRECOSE) 25 MG tablet [Pharmacy Med Name: ACARBOSE 25 MG TABLET] 90 tablet 0    Sig: TAKE 1 TABLET BY MOUTH 3 TIMES A DAY WITH MEALS     Endocrinology:  Diabetes - Alpha Glucosidase Inhibitors - acarbose Failed - 10/24/2017  2:53 PM      Failed - HBA1C is between 0 and 7.9 and within 180 days    Hemoglobin A1C  Date Value Ref Range Status  03/06/2017 5.9  Final   Hgb A1c MFr Bld  Date Value Ref Range Status  01/22/2016 5.3 4.8 - 5.6 % Final    Comment:             Pre-diabetes: 5.7 - 6.4          Diabetes: >6.4          Glycemic control for adults with diabetes: <7.0          Failed - ALT in normal range and within 90 days    ALT  Date Value Ref Range Status  08/02/2017 38 (H) 0 - 32 IU/L Final         Passed - AST in normal range and within 90 days    AST  Date Value Ref Range Status  08/02/2017 28 0 - 40 IU/L Final         Passed - Valid encounter within last 6 months    Recent Outpatient Visits          2 months ago    [facility], Laurey Arrow, MD   2 months ago Nausea without vomiting   [facility], Laurey Arrow, MD   4 months ago Iron deficiency anemia, unspecified iron deficiency anemia type   [facility], Laurey Arrow, MD   6 months ago Idiopathic progressive polyneuropathy   [facility], Laurey Arrow, MD   6 months ago Cervicalgia   [facility], Laurey Arrow, MD      Future Appointments            In 3 weeks Shawnee Knapp, MD [facility] [place], [facility]

## 2017-10-27 ENCOUNTER — Other Ambulatory Visit: Payer: Self-pay

## 2017-10-27 ENCOUNTER — Telehealth: Payer: Self-pay | Admitting: Family Medicine

## 2017-10-27 MED ORDER — ACARBOSE 25 MG PO TABS: 25.0000 mg | ORAL_TABLET | Freq: Three times a day (TID) | ORAL | 1 refills | Status: DC

## 2017-10-27 NOTE — Telephone Encounter (Signed)
Pt called in requesting a months supply to be called [facility].

## 2017-10-27 NOTE — Telephone Encounter (Signed)
Please advise 

## 2017-10-27 NOTE — Telephone Encounter (Signed)
sent 

## 2017-10-27 NOTE — Telephone Encounter (Signed)
Copied from [place] [phone]. Topic: Referral - Request for Referral >> Oct 27, 2017  3:43 PM Reyne Dumas L wrote: Has patient seen PCP for this complaint? Yes, by Dr. Brigitte Pulse order was placed and pt cancelled it.  She now wants it reinstated *If NO, is insurance requiring patient see PCP for this issue before PCP can refer them? Referral for which specialty: Home Care - someone to do dishes and light housekeeping for her.  She can't stand on her feet. Preferred provider/office: Unsure Reason for referral: neuropathy of feet  Pt can be reached at [phone]

## 2017-10-27 NOTE — Telephone Encounter (Signed)
Please refill x 1 Ov is due  

## 2017-10-28 ENCOUNTER — Telehealth: Payer: Self-pay | Admitting: Endocrinology

## 2017-10-28 NOTE — Telephone Encounter (Signed)
10/13 12PM Per [facility] "Caller states she has2 PM  been out of Acrabose for 4 days. Caller states her glucose rose then fell in to the 50s-40s. Caller states her glucose made her shake and go to sleep. Caller states she needs the medication refilled." "Caller states her blood glucose has been going really high then really low. Caller states her blood glucpse had gone really high this morning after eating breakfast (low carb) then went down into the 40s. Caller states she had lost consciousness and is now ok. Caller is not on insulin but pt wants her Acrabose refilled. Caller states she hasnt has it since Thursday."

## 2017-10-28 NOTE — Telephone Encounter (Signed)
Please advise 

## 2017-10-28 NOTE — Telephone Encounter (Signed)
Please verify rx was sent, and pt is aware of that

## 2017-10-28 NOTE — Telephone Encounter (Signed)
Pharmacy stated it was picked up on 10/26/17

## 2017-10-29 ENCOUNTER — Telehealth: Payer: Self-pay | Admitting: Family Medicine

## 2017-10-29 NOTE — Telephone Encounter (Signed)
Called and spoke with pt - advised her of her appt time 2:30 on 11/18/17 with Dr. Brigitte Pulse - advised of time, building and late policy.

## 2017-10-30 ENCOUNTER — Other Ambulatory Visit: Payer: Self-pay | Admitting: Family Medicine

## 2017-10-30 ENCOUNTER — Telehealth: Payer: Self-pay | Admitting: Neurology

## 2017-10-30 DIAGNOSIS — G894 Chronic pain syndrome: Secondary | ICD-10-CM

## 2017-10-30 DIAGNOSIS — G629 Polyneuropathy, unspecified: Secondary | ICD-10-CM

## 2017-10-30 DIAGNOSIS — G609 Hereditary and idiopathic neuropathy, unspecified: Secondary | ICD-10-CM

## 2017-10-30 NOTE — Telephone Encounter (Signed)
Patient is calling in stating that she wants to get a wheelchair. She is having trouble walking. She wants a child's wheelchair that's electric and wants a prescription written for her. If you need to call her back it's [phone]. Thanks!

## 2017-10-30 NOTE — Telephone Encounter (Signed)
Patient is calling about this and states she is needing a wheelchair soon as possible. Please advise. Patient would like a call back.  [phone]

## 2017-10-31 ENCOUNTER — Encounter: Payer: Self-pay | Admitting: Family Medicine

## 2017-10-31 DIAGNOSIS — E119 Type 2 diabetes mellitus without complications: Secondary | ICD-10-CM | POA: Diagnosis not present

## 2017-10-31 DIAGNOSIS — H35361 Drusen (degenerative) of macula, right eye: Secondary | ICD-10-CM | POA: Diagnosis not present

## 2017-10-31 DIAGNOSIS — H33311 Horseshoe tear of retina without detachment, right eye: Secondary | ICD-10-CM | POA: Diagnosis not present

## 2017-10-31 DIAGNOSIS — H43812 Vitreous degeneration, left eye: Secondary | ICD-10-CM | POA: Diagnosis not present

## 2017-11-02 DIAGNOSIS — L719 Rosacea, unspecified: Secondary | ICD-10-CM

## 2017-11-02 DIAGNOSIS — G894 Chronic pain syndrome: Secondary | ICD-10-CM | POA: Insufficient documentation

## 2017-11-02 HISTORY — DX: Rosacea, unspecified: L71.9

## 2017-11-02 NOTE — Telephone Encounter (Signed)
Note in chart, pt advised to make appt. For eval for wheelchair.

## 2017-11-03 NOTE — Telephone Encounter (Signed)
For a wheelchair, I believe she needs an evaluation

## 2017-11-03 NOTE — Telephone Encounter (Signed)
Patient is calling again wanting to know what to do in regards to getting a wheelchair. She is also needing a refill on her Vicodin and fentanyl patches at the [facility]. [place]. Please call her back at [phone]. Thanks!

## 2017-11-03 NOTE — Telephone Encounter (Signed)
Called and spoke with Pt, advised her will need evaluation and I will start that process. She will be out of pain and fentanyl next week and her PCP is out of town and she has not rcvd an appt with pain management. She would like Dr Georgie Chard advice as to who she should see. She is aware we do nt prescribe these type of medications

## 2017-11-04 NOTE — Addendum Note (Signed)
Addended by: Clois Comber on: 11/04/2017 08:53 AM   Modules accepted: Orders

## 2017-11-04 NOTE — Telephone Encounter (Signed)
Dr. Alysia Penna ([facility]) may treat pain.

## 2017-11-04 NOTE — Telephone Encounter (Signed)
LMOVM advising Pt an order has been sent to Dr Letta Pate, and process started for wheelchair

## 2017-11-06 NOTE — Telephone Encounter (Signed)
Patient called to say that in the past she have spoken to Dr Brigitte Pulse about getting help but her husband was not comfortable with the idea of having someone in their home but now she spoke to her husband and they finally decided to have an evaluation for home care. She stated that due to her declining health she does need help and is willing to bend. Please contact patient and let her know what the next step is for this process to begin. Stated that it gets harder and harder every day.  Ph.  [phone]

## 2017-11-06 NOTE — Telephone Encounter (Signed)
Messages sent to Dr. Brigitte Pulse.   Please advise if pt needs evaluation or referral

## 2017-11-11 ENCOUNTER — Encounter: Payer: Self-pay | Admitting: Physical Medicine & Rehabilitation

## 2017-11-12 ENCOUNTER — Encounter: Payer: Self-pay | Admitting: Family Medicine

## 2017-11-12 ENCOUNTER — Telehealth: Payer: Self-pay

## 2017-11-12 NOTE — Telephone Encounter (Signed)
Sent pt below info on MyChart:  Getting an electric wheelchair is excessively difficult - I've had multiple people who have had lower leg amputations be denied.  Typically, someone has to demonstrate that they can't use a walker, scooter, regular wheelchair.  I would be very surprised if she met the criteria for an electric wheelchair but if she would like to try, the first step is a PT referral for a wheelchair eval.  If they authorize and order an electric wheelchair (they usually will help her find the supplier, get the paperwork), then she has to have a dedicated appointment with me where we can't address anything else other than the "mobility exam for a power mobility device". Try to make sure the appointment w/ me is asap after the PT eval - enough time for them to get me the paperwork (which I need to have at time of visit) but also enough time for me to submit the paperwork, get it returned to make corrections (inevitable) and resubmit (sometimes this happens mult times). If final paperwork isn't correctly submitted within a certain time frame from the initial PT eval - then the WHOLE process needs to be started over again.  She can let me know if she wants the PT wheelchair eval at the time of her visit.  I will discuss home care w/ pt at her visit - I prev referred her for home health (March 2019) but they do NOT do any of light chores or even help give her a bath - do NOT help around the house - home health will only come out for a limited # of visits to address specific ways to improve her health e.g. they can recommend helpful DME products or rec ways to make the home more safe or ways to do things that might be more fxnal and decrease her fall risk. They often have a Education officer, museum who might be able to give pt more info on how to get a home health AIDE for [place] (PCS) - which is what it sounds like she wanting. However, medicare and private ins RARELY covers much PCS - normally  have to be disabled on medicaid and then it is still pretty difficult. However, if she wants to check her eligibility she can call her insurance company or call some of the local companies that provide these services.  We do not do referrals for this.  If she does find a company that she likes (and takes her ins and are accepting new pts and think she will be eligible), then that company will send me their referral/eligibility forms for completion.  Some of the local companies I have heard of include [employer], [employer], [employer] but I cannot recommend for or against anyone as it happens so rarely that pt's are able to get approved/afford this that I really dont have any knowledge of what company to recommend or not

## 2017-11-12 NOTE — Telephone Encounter (Signed)
Copied from [place] [phone]. Topic: Referral - Request for Referral >> Oct 27, 2017  3:43 PM Reyne Dumas L wrote: Has patient seen PCP for this complaint? Yes, by Dr. Brigitte Pulse order was placed and pt cancelled it.  She now wants it reinstated *If NO, is insurance requiring patient see PCP for this issue before PCP can refer them? Referral for which specialty: Home Care - someone to do dishes and light housekeeping for her.  She can't stand on her feet. Preferred provider/office: Unsure Reason for referral: neuropathy of feet  Pt can be reached at [phone] >> Nov 12, 2017  4:03 PM Ahmed Prima L wrote: Patient is calling back to check the status of this and would like to know what she needs to do?  Message sent to Dr. Brigitte Pulse

## 2017-11-12 NOTE — Telephone Encounter (Signed)
Sent pt mychart info on this

## 2017-11-14 DIAGNOSIS — H33311 Horseshoe tear of retina without detachment, right eye: Secondary | ICD-10-CM | POA: Diagnosis not present

## 2017-11-18 ENCOUNTER — Ambulatory Visit: Payer: Medicare Other | Admitting: Family Medicine

## 2017-11-21 ENCOUNTER — Telehealth: Payer: Self-pay | Admitting: Neurology

## 2017-11-21 NOTE — Telephone Encounter (Signed)
Patient is calling in stating that she asked for assistance with getting a kids wheelchair. She has not received a call from the company with the next steps. She said she also thinks that she needs another appt for different symptoms if possible. Sharp spasms all over body most in legs. Does she need a referral? Please call her back at [phone]. Thanks!

## 2017-11-22 ENCOUNTER — Other Ambulatory Visit: Payer: Self-pay | Admitting: Endocrinology

## 2017-11-22 ENCOUNTER — Encounter: Payer: Self-pay | Admitting: Family Medicine

## 2017-11-23 NOTE — Telephone Encounter (Signed)
Please refill x 1 Ov is due  

## 2017-11-24 ENCOUNTER — Ambulatory Visit: Payer: Self-pay | Admitting: Physical Medicine & Rehabilitation

## 2017-11-24 ENCOUNTER — Encounter: Payer: Medicare Other | Attending: Physical Medicine & Rehabilitation

## 2017-11-24 NOTE — Telephone Encounter (Signed)
Called and spoke with Pt, advised her Andria Rhein was in the office on Friday to have P/T orders signed for wheel chair assessment. I asked Debbie to at that time to call Pt. Pt states no one has called her, but she does not answer calls she doesn't know. I advised her to answer calls until this is scheduled. Sent Andria Rhein a community message.

## 2017-11-25 ENCOUNTER — Ambulatory Visit: Payer: Self-pay | Admitting: Physician Assistant

## 2017-11-26 DIAGNOSIS — M797 Fibromyalgia: Secondary | ICD-10-CM | POA: Diagnosis not present

## 2017-11-26 DIAGNOSIS — G629 Polyneuropathy, unspecified: Secondary | ICD-10-CM | POA: Diagnosis not present

## 2017-11-26 DIAGNOSIS — M47812 Spondylosis without myelopathy or radiculopathy, cervical region: Secondary | ICD-10-CM | POA: Diagnosis not present

## 2017-11-27 ENCOUNTER — Telehealth: Payer: Self-pay | Admitting: Neurology

## 2017-11-27 NOTE — Telephone Encounter (Signed)
Patient called and left a vm wanting to speak about medication. Please call her back at [phone]. Thanks!

## 2017-11-28 NOTE — Telephone Encounter (Signed)
Called and LMOVM for Pt to return call

## 2017-12-01 ENCOUNTER — Telehealth: Payer: Self-pay | Admitting: Neurology

## 2017-12-01 ENCOUNTER — Telehealth: Payer: Self-pay

## 2017-12-01 MED ORDER — FAMOTIDINE 40 MG PO TABS: 40.0000 mg | ORAL_TABLET | Freq: Every day | ORAL | 3 refills | Status: DC

## 2017-12-01 NOTE — Telephone Encounter (Signed)
Patient informed that we are switching her ranitidine 300mg  to Pepcid 40 mg due to the recall. Confirmed pharmacy.

## 2017-12-01 NOTE — Telephone Encounter (Signed)
Called and spoke with Pt, advised her per Dr Tomi Likens, he defers and questions about implanted stimulators to pain management. She states she has not had a call from Andria Rhein to schedule evaluation for chair. I advised her I will find a number she can call and schedule herself.

## 2017-12-01 NOTE — Telephone Encounter (Signed)
Patient called the office and left voice mail message. She stated someone called her on Friday however the office closed before she was able to return the message. She states she has not received follow-up from our office regarding the home health evaluation. Patient also stated that her pain management Dr. is wanting to take her off her Fentanyl patches and discussed implanting a spinal cord stimulator.She wanted to know what Dr. Tomi Likens thoughts were about the stimulator. She does not know much about the stimulator and wanted his opinion. She can be reached at [phone].

## 2017-12-05 ENCOUNTER — Telehealth: Payer: Self-pay | Admitting: Neurology

## 2017-12-05 ENCOUNTER — Ambulatory Visit: Payer: Self-pay | Admitting: Physical Medicine & Rehabilitation

## 2017-12-05 NOTE — Telephone Encounter (Signed)
Patient is calling in stating that some agency has the wrong number for her because they have not called her for the assessment on her legs. She also has some blood work sent in from [facility] that [facility] needs to review. Have you gottne this? Please call her back at [phone]. Thanks!

## 2017-12-05 NOTE — Telephone Encounter (Signed)
Called and LMOVM with Jacquelyne Balint # [phone]

## 2017-12-08 NOTE — Telephone Encounter (Signed)
If she is taking Lyrica 50mg  twice daily, then the only recommendation is to try increasing to three times daily.

## 2017-12-08 NOTE — Telephone Encounter (Signed)
Patient is calling in stating that the phone number for [facility] ro[facility] is wrong. She has [phone]. And she is also complaining of symptoms getting worse. Please call her back at [phone]. Thanks!

## 2017-12-08 NOTE — Telephone Encounter (Signed)
Called and spoke with Pt, advised her of recommendation

## 2017-12-08 NOTE — Telephone Encounter (Signed)
Called and spoke with Pt, advised her I mistakenly gave her the incorrect #, the correct # is [phone].  Pt wants [place] to be aware of test results from [facility] in her chart.  Pt states her neuralgia has progressed quickly into her legs and arms/hands. She wants to know if there is anything you can recommend for this. She is unable to see pain management until after the 1st of the year.

## 2017-12-09 ENCOUNTER — Ambulatory Visit (INDEPENDENT_AMBULATORY_CARE_PROVIDER_SITE_OTHER): Payer: Medicare Other | Admitting: Psychiatry

## 2017-12-09 DIAGNOSIS — F331 Major depressive disorder, recurrent, moderate: Secondary | ICD-10-CM

## 2017-12-16 ENCOUNTER — Ambulatory Visit (INDEPENDENT_AMBULATORY_CARE_PROVIDER_SITE_OTHER): Payer: Medicare Other | Admitting: Psychiatry

## 2017-12-16 DIAGNOSIS — F331 Major depressive disorder, recurrent, moderate: Secondary | ICD-10-CM

## 2017-12-17 ENCOUNTER — Encounter: Payer: Self-pay | Admitting: Cardiovascular Disease

## 2017-12-17 ENCOUNTER — Ambulatory Visit (INDEPENDENT_AMBULATORY_CARE_PROVIDER_SITE_OTHER): Payer: Medicare Other | Admitting: Cardiovascular Disease

## 2017-12-17 DIAGNOSIS — I341 Nonrheumatic mitral (valve) prolapse: Secondary | ICD-10-CM | POA: Diagnosis not present

## 2017-12-17 HISTORY — DX: Nonrheumatic mitral (valve) prolapse: I34.1

## 2017-12-17 NOTE — Patient Instructions (Signed)

## 2017-12-17 NOTE — Progress Notes (Signed)
Kayla [name] Date of Birth  September 18, 1945       [facility] Office [address], [address], [place], [place]  [address]   [place], [place]  [address] [phone]     [phone]   Fax  [phone]    Fax [phone]  Problem List: 1. Mitral valve prolapse-mitral regurgitation 2. Hypertension 3. Barrett's esophagus 4. Fibromyalgia 5. History chest pain-she had a normal heart catheterization in 1993. Stress Myoview study in 2008 was normal. 6. Brain aneurism -  7. S/p Roux-en-Y surgery  for Barretts esophagus   Kayla [name] continues to have chest pain.  She takes PRN propranolol which seems to help.  It typically occurs in the afternoon.  Almost always with rest.  She does not get any regular exercise.  She has had lots of bladder infections due to kidney stones recently that have not permitted her to exercise.  She's had lots of problems with anxiety.  July 15, 2014:  Kayla [name] presents for evaluation Her BP has been very low - possibly due to the various medications .  She has had left sided chest pain for years.  Has had a normal cath in the remote past and had a normal myoview  Several years ago .   She was recently seen by Dr. Newman Pies for some palpitations The palpitations would come and go .  Off and on for hours.  Was started on atenolol which has helped the palpitations. She had taken atenolol for years ( many years ago) but was off atenolol for several years.  Her palpitations have resolved after starting the atenolol .    These episodes of tachycardia are at times associated with episodes of CP . Has lost a lot of weight over the past several years. She had a Roux -in - Y gastric bypass ( supposedly to prevent esophageal cancer from Barretts esophagutis. Is not able to eat much.  Can eat 1/4 cup of food at a time .    Sept. 19, 2016:  Continues to have palpitations  - improve with propranolol Continues to have poor  appetite.  Is generally very weak Continues to see her primary medical doctor and her pain management doctor .   Nov. 27,2017:  Seems to be doing well from a cardiac standpoint. Having urology issues.  May need a bladder tack. Still has   Nov. 7, 2018 Has been diagnosed with DM and has diabetic neuropathy.   Lots of pain in her feet.   Still has some CP Still has some palpittations  .   Has lost 40 lbs over the past 10 years.  Admitted that she is not eating well.   July 02, 2017: Follow up for MVP and noncardiac CP  Hx of HTN but has been low for the past several years.   Doing well. Now has a Freestyle Libra glucose monitor  Doing well with that  Continues to lose weight Wt is 110 lbs. Down 6 lbs from last Nov.  Not eating well.   Needs to eat more protein   December 17, 2017: Kayla [name] is seen today for follow-up visit.  She is had problems with orthostatic hypotension.  We felt that she may have some degree of dysautonomia.  It is clear that her diet is not very good. Her weight today is 101 pounds.  This is down 9 pounds from her previous visit.  Her symptoms of orthostasis have improved  Develops palpitations after eating     Current Outpatient Medications on File Prior to Visit  Medication Sig Dispense Refill  . acarbose (PRECOSE) 25 MG tablet TAKE 1 TABLET (25 MG TOTAL) BY MOUTH 3 (THREE) TIMES DAILY WITH MEALS. 90 tablet 0  . aspirin-acetaminophen-caffeine (EXCEDRIN MIGRAINE) 250-250-65 MG tablet Take 2 tablets by mouth 2 (two) times daily.    . celecoxib (CELEBREX) 200 MG capsule Take 200 mg by mouth 2 (two) times daily.    . clonazePAM (KLONOPIN) 1 MG tablet Take 0.5-1 tablets (0.5-1 mg total) by mouth 2 (two) times daily as needed for anxiety. 180 tablet 1  . Continuous Blood Gluc Sensor (FREESTYLE LIBRE 14 DAY SENSOR) MISC 1 Units by Does not apply route every 14 (fourteen) days. 6 each 5  . cyclobenzaprine (FLEXERIL) 10 MG tablet Take 1 tablet (10 mg total) by  mouth 3 (three) times daily as needed for muscle spasms. 270 tablet 1  . cycloSPORINE (RESTASIS) 0.05 % ophthalmic emulsion Place 1 drop into both eyes 2 (two) times daily. 5.5 mL 5  . diclofenac sodium (VOLTAREN) 1 % GEL Apply 2 g topically 4 (four) times daily. 700 g 3  . dicyclomine (BENTYL) 10 MG capsule Take 1-2 tab 3 times daily AC as needed for spasms and cramping. 270 capsule 1  . diltiazem 2 % GEL Apply 1 application topically 2 (two) times daily as needed (pain from fissure). 30 g 2  . docusate sodium (COLACE) 250 MG capsule Take 1 capsule (250 mg total) by mouth daily. 90 capsule 3  . DULoxetine (CYMBALTA) 30 MG capsule Take 3 capsules (90 mg total) by mouth daily. 90 capsule 5  . Erenumab-aooe (AIMOVIG) 70 MG/ML SOAJ Inject 70 mg into the skin every 30 (thirty) days. 1 pen 11  . esomeprazole (NEXIUM) 40 MG capsule Take 1 capsule (40 mg total) by mouth daily before breakfast. 90 capsule 3  . famotidine (PEPCID) 40 MG tablet Take 1 tablet (40 mg total) by mouth at bedtime. 90 tablet 3  . fentaNYL (DURAGESIC - DOSED MCG/HR) 50 MCG/HR Place 50 mcg onto the skin every 3 (three) days.    . furosemide (LASIX) 40 MG tablet Take 1 tablet (40 mg total) by mouth daily as needed for edema. 90 tablet 0  . HYDROcodone-acetaminophen (NORCO) 7.5-325 MG tablet Take 1 tablet every 6 (six) hours as needed by mouth for moderate pain.    . hypromellose (SYSTANE OVERNIGHT THERAPY) 0.3 % GEL ophthalmic ointment Place 1 drop into both eyes at bedtime. Reported on 02/11/2015    . IRON PO Take by mouth.    Marland Kitchen L-Methylfolate-Algae-B12-B6 (METANX PO) Take 2 mg by mouth every 12 (twelve) hours.    . lidocaine (LIDODERM) 5 % Place 1 patch onto the skin as needed. Remove & Discard patch within 12 hours. May dispense as 3 month supply 90 patch 3  . Lifitegrast (XIIDRA) 5 % SOLN Place 1 drop into both eyes daily.     Marland Kitchen lubiprostone (AMITIZA) 24 MCG capsule Take 1 capsule (24 mcg total) by mouth 2 (two) times daily with a  meal. 180 capsule 3  . meloxicam (MOBIC) 15 MG tablet Take 1 tablet (15 mg total) by mouth daily. Prn pain 90 tablet 1  . metroNIDAZOLE (METROGEL) 1 % gel Apply topically daily. 45 g 0  . mirabegron ER (MYRBETRIQ) 25 MG TB24 tablet Take 1 tablet (25 mg total) by mouth daily. Office visit needed 90 tablet 0  . mupirocin ointment (BACTROBAN) 2 %  Apply 1 application topically 3 (three) times daily. 30 g 1  . naloxegol oxalate (MOVANTIK) 25 MG TABS tablet Take 25 mg by mouth daily.    . Naloxone HCl 0.4 MG/0.4ML SOAJ Administer 0.7m [place] at first sign of opioid overdose and repeat every 2 minutes as needed for resuscitation. Call 911 immediately 5 Package 0  . Nerve Stimulator (CEFALY KIT) DEVI 20 Minutes by Does not apply route as directed. 1 Device 0  . NON FORMULARY [facility]  Peripheral Neuropathy Cream- Bupivacaine 1%, Doxepin 3%, Gabapentin 6%, Pentoxifylline 3%, Topiramate 1% Apply 1-2 grams to affected area 3-4 times daily Qty. 120 gm 3 refills    . ondansetron (ZOFRAN) 8 MG tablet Take 1 tablet (8 mg total) by mouth every 8 (eight) hours as needed for nausea or vomiting. 60 tablet 0  . ONE TOUCH LANCETS MISC Use to check cbgs 6 times daily as directed by physician. Testing freq: hypoglycemia, hyperglycemia. ICD10: R73.03, E 16.2 600 each 5  . potassium chloride SA (K-DUR,KLOR-CON) 20 MEQ tablet Take 1 tablet (20 mEq total) by mouth daily as needed (only take along with the furosemide). 90 tablet 0  . pregabalin (LYRICA) 50 MG capsule Take 1 capsule (50 mg total) by mouth 3 (three) times daily. (Patient taking differently: Take 50 mg by mouth 2 (two) times daily. ) 90 capsule 3  . Probiotic Product (PROBIOTIC-10 PO) Take by mouth.     . SUMAtriptan (IMITREX) 100 MG tablet Take 1 tablet at earliest onset of headache, may repeat in 2 hours if headache persists or reoccurs. (Patient taking differently: Take 100 mg by mouth every 3 (three) days. ) 30 tablet 1  . tizanidine (ZANAFLEX) 2 MG  capsule TAKE 1 CAPSULE (2 MG TOTAL) BY MOUTH 3 (THREE) TIMES DAILY.    . traZODone (DESYREL) 100 MG tablet Take 0.5-1 tablets (50-100 mg total) by mouth at bedtime as needed for sleep. And 1/2 -1 if wakes at 4am. 90 tablet 1  . valACYclovir (VALTREX) 1000 MG tablet Take 1 tablet (1,000 mg total) by mouth 3 (three) times daily. 21 tablet 0  . [DISCONTINUED] buPROPion (WELLBUTRIN) 75 MG tablet Take 75 mg by mouth daily after breakfast.      No current facility-administered medications on file prior to visit.     Allergies  Allergen Reactions  . Ambien [Zolpidem Tartrate]     Hallucinations  . Codeine Anaphylaxis  . Oxycodone-Acetaminophen Shortness Of Breath  . Tylox [Oxycodone-Acetaminophen] Anaphylaxis    Chest pain  . Darvocet [Propoxyphene N-Acetaminophen]     Chest pain  . Darvon [Propoxyphene] Itching  . Lorcet [Hydrocodone-Acetaminophen]     Chest pain  . Opium     hallucinations  . Vicodin [Hydrocodone-Acetaminophen] Other (See Comments)    Chest Pain   . Wheat Bran   . Amoxicillin Nausea And Vomiting    Reaction:  Migraine headache  . Penicillins Nausea And Vomiting    Migraine Headaches    Past Medical History:  Diagnosis Date  . Allergy   . Anal fissure 10/08/2016  . Anemia    anemia in the past  . Angina    takes Propanolol prn and it stops her chest  . Anxiety   . Barrett esophagus    not consistently present  . Cataract   . Chronic kidney disease    kidney stones and UTI  . Complication of anesthesia    either  BP or pulse dropped with last surgery  . DDD (degenerative disc  disease)    cervical and lumbar  . Dementia ([place])   . Depression    post traumatic stress  disorder  . Diabetes ([place]) 11/18/2016  . Diabetes mellitus    sugar goes up and down diet controlled  . Dysrhythmia    brought on by stress  . External hemorrhoids   . Fatigue   . Fibromyalgia    neuropathy knees ankles and toes, bladder  . GERD (gastroesophageal reflux disease)   .  H/O hyperparathyroidism   . Headache(784.0)    migraines  . Hiatal hernia   . History of kidney stones   . Hypertension    3 small brain aneurysms  . Hypoglycemia 11/08/2016  . IBS (irritable bowel syndrome)   . Internal hemorrhoids   . Macular degeneration   . Migraines   . Mitral regurgitation 12/11/2015  . Osteoarthritis    all over  . Osteopetrosis   . Peripheral vascular disease ([facility])    superficial phlebitis in left calf  . Personal history of colonic polyps 07/22/2013   07/2013 - 3 small adenomas - repeat colonoscopy 2018  . Small intestinal bacterial overgrowth 08/05/2017   + lactulose breath test 06/2017 Xifaxan Rx    Past Surgical History:  Procedure Laterality Date  . ABDOMINAL HYSTERECTOMY    . APPENDECTOMY    . CARDIAC CATHETERIZATION  03/22/1991   EF 61%  . CARDIOVASCULAR STRESS TEST  10/03/2006  . CHOLECYSTECTOMY    . COLONOSCOPY  06/23/2008   normal  . DILATION AND CURETTAGE OF UTERUS     x2  . ESOPHAGUS SURGERY    . EXPLORATORY LAPAROTOMY  1993  . GASTRIC BYPASS  1999  . GASTRIC RESTRICTION SURGERY  1991  . LASIK Bilateral   . Right Arm Surgery    . Right Knee Arthroscopy    . Rt wrist fx  2009  . stomach stappeling  1991  . STONE EXTRACTION WITH BASKET  2012  . TONSILLECTOMY    . TUBAL LIGATION    . UPPER GASTROINTESTINAL ENDOSCOPY  06/23/2008   w/Dil, Barrett's esophagus  . US ECHOCARDIOGRAPHY  01/21/2007   EF 55-60%  . US ECHOCARDIOGRAPHY  08/30/2003   EF 55-60%    Social History   Tobacco Use  Smoking Status Never Smoker  Smokeless Tobacco Never Used    Social History   Substance and Sexual Activity  Alcohol Use No    Family History  Problem Relation Age of Onset  . Stroke Mother   . Lung cancer Mother   . Heart disease Mother   . Diabetes Mother   . Hypertension Father   . Stroke Father   . Lung cancer Father   . Heart disease Father   . Kidney disease Father   . Seizures Father        epilepsy, and sisters x 2  . Pancreatic  cancer Sister   . Lung cancer Sister   . Colon cancer Maternal Aunt        12 relatives  . Uterine cancer Unknown        aunt  . Heart disease Unknown        grandmother  . Clotting disorder Sister   . Heart disease Sister        x 2  . Kidney disease Sister        x 2  . Breast cancer Sister   . Colon cancer Paternal Aunt   . Colon cancer Paternal Aunt     Reviw  of Systems:  Reviewed in the HPI.  All other systems are negative.  Physical Exam: Blood pressure 98/64, pulse 96, height 5' 5" (1.651 m), weight 101 lb (45.8 kg), SpO2 96 %.  GEN:   Extremely thin, chronically ill , middle age female.    NAD  HEENT: Normal NECK: No JVD; No carotid bruits LYMPHATICS: No lymphadenopathy CARDIAC: RRR  , sof systolic murmur .  RESPIRATORY:  Clear to auscultation without rales, wheezing or rhonchi  ABDOMEN: Soft, non-tender, non-distended MUSCULOSKELETAL:  No edema; No deformity  SKIN: Warm and dry NEUROLOGIC:  Alert and oriented x 3   ECG:    Assessment / Plan:   1.  Orthostatic hypotension:   Stable She continues to lose weight  nees to eat more.   2.  Chest pain:    No further CP.  2. Palpitations:  Stable     Mertie Moores, MD  12/17/2017 3:08 PM    [facility] [place],  [place], [place]  [address] Pager [phone] Phone: [phone]; Fax: [phone]

## 2017-12-18 ENCOUNTER — Other Ambulatory Visit: Payer: Self-pay | Admitting: Endocrinology

## 2017-12-18 NOTE — Telephone Encounter (Signed)
Please refill x 1 Ov is due  

## 2017-12-22 ENCOUNTER — Telehealth: Payer: Self-pay | Admitting: Neurology

## 2017-12-22 DIAGNOSIS — M5136 Other intervertebral disc degeneration, lumbar region: Secondary | ICD-10-CM | POA: Diagnosis not present

## 2017-12-22 DIAGNOSIS — M5030 Other cervical disc degeneration, unspecified cervical region: Secondary | ICD-10-CM | POA: Diagnosis not present

## 2017-12-22 DIAGNOSIS — M797 Fibromyalgia: Secondary | ICD-10-CM | POA: Diagnosis not present

## 2017-12-22 DIAGNOSIS — G894 Chronic pain syndrome: Secondary | ICD-10-CM | POA: Diagnosis not present

## 2017-12-22 NOTE — Telephone Encounter (Signed)
Patient needs to talk to someone about her referral to [facility] for a electric wheelchair. She has been approved for one but Sonia Baller states that Dr Tomi Likens did not ask for a electric wheel chair and we will need to do a a new script with that information on it. She states that it will take 2-3 months for the process to take place. It is really effecting her arms and hands now and she cant use them when it is acting up please call her

## 2017-12-23 NOTE — Telephone Encounter (Signed)
Debbie called me back to let me know she had attempted to reach Pt x2, LMOVM for her to return call. Debbie also called [facility] where Pt had evaluation yesterday, spoke with Sonia Baller who was present during evaluation and they are moving forward with power chair.

## 2017-12-23 NOTE — Telephone Encounter (Signed)
Called Kayla [name] who is coordinating the electric wheelchair. She is going to call Pt and assure her she is to get an electric wheelchair and we did put in the correct order.

## 2017-12-24 DIAGNOSIS — F4542 Pain disorder with related psychological factors: Secondary | ICD-10-CM | POA: Diagnosis not present

## 2017-12-24 DIAGNOSIS — G629 Polyneuropathy, unspecified: Secondary | ICD-10-CM | POA: Diagnosis not present

## 2017-12-24 DIAGNOSIS — F413 Other mixed anxiety disorders: Secondary | ICD-10-CM | POA: Diagnosis not present

## 2017-12-24 DIAGNOSIS — F39 Unspecified mood [affective] disorder: Secondary | ICD-10-CM | POA: Diagnosis not present

## 2017-12-24 DIAGNOSIS — F609 Personality disorder, unspecified: Secondary | ICD-10-CM | POA: Diagnosis not present

## 2017-12-25 ENCOUNTER — Ambulatory Visit (INDEPENDENT_AMBULATORY_CARE_PROVIDER_SITE_OTHER): Payer: Medicare Other | Admitting: Psychiatry

## 2017-12-25 DIAGNOSIS — G629 Polyneuropathy, unspecified: Secondary | ICD-10-CM | POA: Diagnosis not present

## 2017-12-25 DIAGNOSIS — F339 Major depressive disorder, recurrent, unspecified: Secondary | ICD-10-CM

## 2017-12-25 DIAGNOSIS — M797 Fibromyalgia: Secondary | ICD-10-CM | POA: Diagnosis not present

## 2017-12-26 ENCOUNTER — Telehealth: Payer: Self-pay

## 2017-12-26 ENCOUNTER — Ambulatory Visit (INDEPENDENT_AMBULATORY_CARE_PROVIDER_SITE_OTHER): Payer: Medicare Other | Admitting: Endocrinology

## 2017-12-26 ENCOUNTER — Encounter: Payer: Self-pay | Admitting: Endocrinology

## 2017-12-26 VITALS — BP 100/70 | HR 92 | Ht 65.0 in | Wt 100.8 lb

## 2017-12-26 DIAGNOSIS — E119 Type 2 diabetes mellitus without complications: Secondary | ICD-10-CM | POA: Diagnosis not present

## 2017-12-26 DIAGNOSIS — E162 Hypoglycemia, unspecified: Secondary | ICD-10-CM

## 2017-12-26 LAB — POCT GLYCOSYLATED HEMOGLOBIN (HGB A1C): Hemoglobin A1C: 5.1 % (ref 4.0–5.6)

## 2017-12-26 NOTE — Progress Notes (Signed)
Subjective:    Patient ID: Kayla [name], female    DOB: [birth year], 72 y.o.   MRN: [id number]  HPI Pt returns for f/u of reactive hypoglycemia (she had gastric bypass surgery in 1999, but she reports intermittent hypoglycemia since 1979; no other cause was found; she was rx'ed acarbose).  Pt says she when she eats more than 30 grams of CHO per meal, she has postprandial cbg of 400's 15 minutes after eating.  It then decreases to the 40's, 45 mins later.  She also reports moderate pain of all 4's, and assoc numbness.  I reviewed continuous glucose monitor data today.  Glucose varies from 55-150.  It is in general lowest in the afternoon, but there is little change throughout the day.   Past Medical History:  Diagnosis Date  . Allergy   . Anal fissure 10/08/2016  . Anemia    anemia in the past  . Angina    takes Propanolol prn and it stops her chest  . Anxiety   . Barrett esophagus    not consistently present  . Cataract   . Chronic kidney disease    kidney stones and UTI  . Complication of anesthesia    either  BP or pulse dropped with last surgery  . DDD (degenerative disc disease)    cervical and lumbar  . Dementia ([place])   . Depression    post traumatic stress  disorder  . Diabetes ([place]) 11/18/2016  . Diabetes mellitus    sugar goes up and down diet controlled  . Dysrhythmia    brought on by stress  . External hemorrhoids   . Fatigue   . Fibromyalgia    neuropathy knees ankles and toes, bladder  . GERD (gastroesophageal reflux disease)   . H/O hyperparathyroidism   . Headache(784.0)    migraines  . Hiatal hernia   . History of kidney stones   . Hypertension    3 small brain aneurysms  . Hypoglycemia 11/08/2016  . IBS (irritable bowel syndrome)   . Internal hemorrhoids   . Macular degeneration   . Migraines   . Mitral regurgitation 12/11/2015  . Osteoarthritis    all over  . Osteopetrosis   . Peripheral vascular disease ([facility])    superficial phlebitis in left  calf  . Personal history of colonic polyps 07/22/2013   07/2013 - 3 small adenomas - repeat colonoscopy 2018  . Small intestinal bacterial overgrowth 08/05/2017   + lactulose breath test 06/2017 Xifaxan Rx    Past Surgical History:  Procedure Laterality Date  . ABDOMINAL HYSTERECTOMY    . APPENDECTOMY    . CARDIAC CATHETERIZATION  03/22/1991   EF 61%  . CARDIOVASCULAR STRESS TEST  10/03/2006  . CHOLECYSTECTOMY    . COLONOSCOPY  06/23/2008   normal  . DILATION AND CURETTAGE OF UTERUS     x2  . ESOPHAGUS SURGERY    . EXPLORATORY LAPAROTOMY  1993  . GASTRIC BYPASS  1999  . GASTRIC RESTRICTION SURGERY  1991  . LASIK Bilateral   . Right Arm Surgery    . Right Knee Arthroscopy    . Rt wrist fx  2009  . stomach stappeling  1991  . STONE EXTRACTION WITH BASKET  2012  . TONSILLECTOMY    . TUBAL LIGATION    . UPPER GASTROINTESTINAL ENDOSCOPY  06/23/2008   w/Dil, Barrett's esophagus  . US ECHOCARDIOGRAPHY  01/21/2007   EF 55-60%  . US ECHOCARDIOGRAPHY  08/30/2003   EF  55-60%    Social History   Socioeconomic History  . Marital status: Married    Spouse name: Rushie Chestnut.  . Number of children: 2  . Years of education: Not on file  . Highest education level: Not on file  Occupational History  . Occupation: Disability    Employer: RETIRED  Social Needs  . Financial resource strain: Not on file  . Food insecurity:    Worry: Not on file    Inability: Not on file  . Transportation needs:    Medical: Not on file    Non-medical: Not on file  Tobacco Use  . Smoking status: Never Smoker  . Smokeless tobacco: Never Used  Substance and Sexual Activity  . Alcohol use: No  . Drug use: No  . Sexual activity: Not Currently  Lifestyle  . Physical activity:    Days per week: Not on file    Minutes per session: Not on file  . Stress: Not on file  Relationships  . Social connections:    Talks on phone: Not on file    Gets together: Not on file    Attends religious service: Not on file     Active member of club or organization: Not on file    Attends meetings of clubs or organizations: Not on file    Relationship status: Not on file  . Intimate partner violence:    Fear of current or ex partner: Not on file    Emotionally abused: Not on file    Physically abused: Not on file    Forced sexual activity: Not on file  Other Topics Concern  . Not on file  Social History Narrative   She retired on disability   Married   2 Children    Current Outpatient Medications on File Prior to Visit  Medication Sig Dispense Refill  . acarbose (PRECOSE) 25 MG tablet TAKE 1 TABLET (25 MG TOTAL) BY MOUTH 3 (THREE) TIMES DAILY WITH MEALS. 90 tablet 0  . aspirin-acetaminophen-caffeine (EXCEDRIN MIGRAINE) 250-250-65 MG tablet Take 2 tablets by mouth 2 (two) times daily.    . celecoxib (CELEBREX) 200 MG capsule Take 200 mg by mouth 2 (two) times daily.    . clonazePAM (KLONOPIN) 1 MG tablet Take 0.5-1 tablets (0.5-1 mg total) by mouth 2 (two) times daily as needed for anxiety. 180 tablet 1  . Continuous Blood Gluc Sensor (FREESTYLE LIBRE 14 DAY SENSOR) MISC 1 Units by Does not apply route every 14 (fourteen) days. 6 each 5  . cyclobenzaprine (FLEXERIL) 10 MG tablet Take 1 tablet (10 mg total) by mouth 3 (three) times daily as needed for muscle spasms. 270 tablet 1  . cycloSPORINE (RESTASIS) 0.05 % ophthalmic emulsion Place 1 drop into both eyes 2 (two) times daily. 5.5 mL 5  . diclofenac sodium (VOLTAREN) 1 % GEL Apply 2 g topically 4 (four) times daily. 700 g 3  . dicyclomine (BENTYL) 10 MG capsule Take 1-2 tab 3 times daily AC as needed for spasms and cramping. 270 capsule 1  . diltiazem 2 % GEL Apply 1 application topically 2 (two) times daily as needed (pain from fissure). 30 g 2  . docusate sodium (COLACE) 250 MG capsule Take 1 capsule (250 mg total) by mouth daily. 90 capsule 3  . DULoxetine (CYMBALTA) 30 MG capsule Take 3 capsules (90 mg total) by mouth daily. 90 capsule 5  . Erenumab-aooe  (AIMOVIG) 70 MG/ML SOAJ Inject 70 mg into the skin every 30 (thirty) days.  1 pen 11  . esomeprazole (NEXIUM) 40 MG capsule Take 1 capsule (40 mg total) by mouth daily before breakfast. 90 capsule 3  . famotidine (PEPCID) 40 MG tablet Take 1 tablet (40 mg total) by mouth at bedtime. 90 tablet 3  . fentaNYL (DURAGESIC - DOSED MCG/HR) 50 MCG/HR Place 50 mcg onto the skin every 3 (three) days.    Marland Kitchen HYDROcodone-acetaminophen (NORCO) 7.5-325 MG tablet Take 1 tablet every 6 (six) hours as needed by mouth for moderate pain.    . hypromellose (SYSTANE OVERNIGHT THERAPY) 0.3 % GEL ophthalmic ointment Place 1 drop into both eyes at bedtime. Reported on 02/11/2015    . IRON PO Take by mouth.    Marland Kitchen L-Methylfolate-Algae-B12-B6 (METANX PO) Take 2 mg by mouth every 12 (twelve) hours.    . lidocaine (LIDODERM) 5 % Place 1 patch onto the skin as needed. Remove & Discard patch within 12 hours. May dispense as 3 month supply 90 patch 3  . Lifitegrast (XIIDRA) 5 % SOLN Place 1 drop into both eyes daily.     Marland Kitchen lubiprostone (AMITIZA) 24 MCG capsule Take 1 capsule (24 mcg total) by mouth 2 (two) times daily with a meal. 180 capsule 3  . meloxicam (MOBIC) 15 MG tablet Take 1 tablet (15 mg total) by mouth daily. Prn pain 90 tablet 1  . metroNIDAZOLE (METROGEL) 1 % gel Apply topically daily. 45 g 0  . mirabegron ER (MYRBETRIQ) 25 MG TB24 tablet Take 1 tablet (25 mg total) by mouth daily. Office visit needed 90 tablet 0  . mupirocin ointment (BACTROBAN) 2 % Apply 1 application topically 3 (three) times daily. 30 g 1  . naloxegol oxalate (MOVANTIK) 25 MG TABS tablet Take 25 mg by mouth daily.    . Naloxone HCl 0.4 MG/0.4ML SOAJ Administer 0.73m [place] at first sign of opioid overdose and repeat every 2 minutes as needed for resuscitation. Call 911 immediately 5 Package 0  . Nerve Stimulator (CEFALY KIT) DEVI 20 Minutes by Does not apply route as directed. 1 Device 0  . NON FORMULARY [facility]  Peripheral Neuropathy  Cream- Bupivacaine 1%, Doxepin 3%, Gabapentin 6%, Pentoxifylline 3%, Topiramate 1% Apply 1-2 grams to affected area 3-4 times daily Qty. 120 gm 3 refills    . ondansetron (ZOFRAN) 8 MG tablet Take 1 tablet (8 mg total) by mouth every 8 (eight) hours as needed for nausea or vomiting. 60 tablet 0  . ONE TOUCH LANCETS MISC Use to check cbgs 6 times daily as directed by physician. Testing freq: hypoglycemia, hyperglycemia. ICD10: R73.03, E 16.2 600 each 5  . pregabalin (LYRICA) 50 MG capsule Take 1 capsule (50 mg total) by mouth 3 (three) times daily. (Patient taking differently: Take 50 mg by mouth 2 (two) times daily. ) 90 capsule 3  . Probiotic Product (PROBIOTIC-10 PO) Take by mouth.     . SUMAtriptan (IMITREX) 100 MG tablet Take 1 tablet at earliest onset of headache, may repeat in 2 hours if headache persists or reoccurs. (Patient taking differently: Take 100 mg by mouth every 3 (three) days. ) 30 tablet 1  . tizanidine (ZANAFLEX) 2 MG capsule TAKE 1 CAPSULE (2 MG TOTAL) BY MOUTH 3 (THREE) TIMES DAILY.    . traZODone (DESYREL) 100 MG tablet Take 0.5-1 tablets (50-100 mg total) by mouth at bedtime as needed for sleep. And 1/2 -1 if wakes at 4am. 90 tablet 1  . valACYclovir (VALTREX) 1000 MG tablet Take 1 tablet (1,000 mg total) by mouth  3 (three) times daily. 21 tablet 0  . furosemide (LASIX) 40 MG tablet Take 1 tablet (40 mg total) by mouth daily as needed for edema. (Patient not taking: Reported on 12/26/2017) 90 tablet 0  . potassium chloride SA (K-DUR,KLOR-CON) 20 MEQ tablet Take 1 tablet (20 mEq total) by mouth daily as needed (only take along with the furosemide). (Patient not taking: Reported on 12/26/2017) 90 tablet 0  . [DISCONTINUED] buPROPion (WELLBUTRIN) 75 MG tablet Take 75 mg by mouth daily after breakfast.      No current facility-administered medications on file prior to visit.     Allergies  Allergen Reactions  . Ambien [Zolpidem Tartrate]     Hallucinations  . Codeine  Anaphylaxis  . Oxycodone-Acetaminophen Shortness Of Breath  . Tylox [Oxycodone-Acetaminophen] Anaphylaxis    Chest pain  . Darvocet [Propoxyphene N-Acetaminophen]     Chest pain  . Darvon [Propoxyphene] Itching  . Lorcet [Hydrocodone-Acetaminophen]     Chest pain  . Opium     hallucinations  . Vicodin [Hydrocodone-Acetaminophen] Other (See Comments)    Chest Pain   . Wheat Bran   . Amoxicillin Nausea And Vomiting    Reaction:  Migraine headache  . Penicillins Nausea And Vomiting    Migraine Headaches    Family History  Problem Relation Age of Onset  . Stroke Mother   . Lung cancer Mother   . Heart disease Mother   . Diabetes Mother   . Hypertension Father   . Stroke Father   . Lung cancer Father   . Heart disease Father   . Kidney disease Father   . Seizures Father        epilepsy, and sisters x 2  . Pancreatic cancer Sister   . Lung cancer Sister   . Colon cancer Maternal Aunt        12 relatives  . Uterine cancer Unknown        aunt  . Heart disease Unknown        grandmother  . Clotting disorder Sister   . Heart disease Sister        x 2  . Kidney disease Sister        x 2  . Breast cancer Sister   . Colon cancer Paternal Aunt   . Colon cancer Paternal Aunt     BP 100/70 (BP Location: Right Arm, Patient Position: Sitting, Cuff Size: Normal)   Pulse 92   Ht '5\' 5"'  (1.651 m)   Wt 100 lb 12.8 oz (45.7 kg)   SpO2 98%   BMI 16.77 kg/m    Review of Systems She has lost more weight.  Denies LOC    Objective:   Physical Exam VITAL SIGNS:  See vs page GENERAL: no distress Ext: trace bilat leg edema.    Lab Results  Component Value Date   HGBA1C 5.1 12/26/2017       Assessment & Plan:  Hypoglycemia: persistent.  Please continue the same acarbose. Polyneuropathy: uncertain etiology.  Ref dietician might help this, too. Malnutrition: worse.  Ref dietician.  Patient Instructions  I have sent a prescription to your pharmacy, for a pill to help the  blood sugar and symptoms.   In my opinion, the best treatment for this is to see a dietician on a regular basis.  Please let us know.   Please come back for a follow-up appointment in 3 months.

## 2017-12-26 NOTE — Telephone Encounter (Signed)
Pt stated that you had discussed a referral during today's visit for her to a nutrionist at [facility] that I do not see ordered, please advise

## 2017-12-26 NOTE — Telephone Encounter (Signed)
Ok, you will receive a phone call, about a day and time for an appointment 

## 2017-12-26 NOTE — Patient Instructions (Addendum)
I have sent a prescription to your pharmacy, for a pill to help the blood sugar and symptoms.   In my opinion, the best treatment for this is to see a dietician on a regular basis.  Please let us know.   Please come back for a follow-up appointment in 3 months.

## 2017-12-29 ENCOUNTER — Ambulatory Visit (INDEPENDENT_AMBULATORY_CARE_PROVIDER_SITE_OTHER): Payer: Medicare Other | Admitting: Family Medicine

## 2017-12-29 ENCOUNTER — Encounter: Payer: Self-pay | Admitting: Family Medicine

## 2017-12-29 ENCOUNTER — Other Ambulatory Visit: Payer: Self-pay

## 2017-12-29 VITALS — BP 104/64 | HR 89 | Temp 98.0°F | Resp 16 | Ht 64.0 in | Wt 99.8 lb

## 2017-12-29 DIAGNOSIS — E610 Copper deficiency: Secondary | ICD-10-CM

## 2017-12-29 DIAGNOSIS — T452X5A Adverse effect of vitamins, initial encounter: Secondary | ICD-10-CM

## 2017-12-29 DIAGNOSIS — G992 Myelopathy in diseases classified elsewhere: Secondary | ICD-10-CM

## 2017-12-29 DIAGNOSIS — E11649 Type 2 diabetes mellitus with hypoglycemia without coma: Secondary | ICD-10-CM

## 2017-12-29 DIAGNOSIS — M810 Age-related osteoporosis without current pathological fracture: Secondary | ICD-10-CM

## 2017-12-29 DIAGNOSIS — Z23 Encounter for immunization: Secondary | ICD-10-CM | POA: Diagnosis not present

## 2017-12-29 DIAGNOSIS — R634 Abnormal weight loss: Secondary | ICD-10-CM

## 2017-12-29 DIAGNOSIS — Z9884 Bariatric surgery status: Secondary | ICD-10-CM | POA: Diagnosis not present

## 2017-12-29 DIAGNOSIS — I739 Peripheral vascular disease, unspecified: Secondary | ICD-10-CM | POA: Diagnosis not present

## 2017-12-29 DIAGNOSIS — R6889 Other general symptoms and signs: Secondary | ICD-10-CM | POA: Diagnosis not present

## 2017-12-29 DIAGNOSIS — E161 Other hypoglycemia: Secondary | ICD-10-CM

## 2017-12-29 DIAGNOSIS — Z8639 Personal history of other endocrine, nutritional and metabolic disease: Secondary | ICD-10-CM

## 2017-12-29 DIAGNOSIS — R739 Hyperglycemia, unspecified: Secondary | ICD-10-CM

## 2017-12-29 DIAGNOSIS — G620 Drug-induced polyneuropathy: Secondary | ICD-10-CM | POA: Diagnosis not present

## 2017-12-29 DIAGNOSIS — E678 Other specified hyperalimentation: Secondary | ICD-10-CM

## 2017-12-29 DIAGNOSIS — G63 Polyneuropathy in diseases classified elsewhere: Secondary | ICD-10-CM

## 2017-12-29 LAB — POC MICROSCOPIC URINALYSIS (UMFC): Mucus: ABSENT

## 2017-12-29 MED ORDER — ZOSTER VAC RECOMB ADJUVANTED 50 MCG/0.5ML IM SUSR: 0.5000 mL | Freq: Once | INTRAMUSCULAR | 1 refills | Status: AC

## 2017-12-29 NOTE — Patient Instructions (Signed)
° ° ° °  If you have lab work done today you will be contacted with your lab results within the next 2 weeks.  If you have not heard from us then please contact us. The fastest way to get your results is to register for My Chart. ° ° °IF you received an x-ray today, you will receive an invoice from [facility]. Please contact [facility] at [phone] with questions or concerns regarding your invoice.  ° °IF you received labwork today, you will receive an invoice from [facility]. Please contact [facility] at [phone] with questions or concerns regarding your invoice.  ° °Our billing staff will not be able to assist you with questions regarding bills from these companies. ° °You will be contacted with the lab results as soon as they are available. The fastest way to get your results is to activate your My Chart account. Instructions are located on the last page of this paperwork. If you have not heard from us regarding the results in 2 weeks, please contact this office. °  ° ° ° °

## 2017-12-29 NOTE — Progress Notes (Signed)
Subjective:    Patient: Kayla [name]  DOB: 1945/12/24; 72 y.o.   MRN: [id number]  Chief Complaint  Patient presents with  . Chronic Conditions    follow-up     HPI So self diagnosed with glaucoma of the left eye followed by Dr.Patel which she has started eyedrops for.   Saw Dr. Acie Fredrickson who felt that her heart was doing well she did have a mild murmur in regard regurg but Dr. Roxanne Mins noted that her heart reacted poorly to food intake and caused tachycardia to approximately 120 bpm.  He was also upset about her weight loss.  He told her she was anorexic and that if she did not put on weight she was likely facing death from multisystem organ failure.  Following with Dr. Loanne Drilling on acarbose but not working as well as whenever she eats cbgs will spike to 400s then crash to 30s-40s - feels absolutely horrible. Sometimes can prevent the crash with a tablespoon of peanut butter or glucose tablets. If her cbgs are not between 80-100 she feels the sxs. She is wearing the [employer] meter.  Asked for referral for neutritionist - pt wanted to be seen at [facility] for this but instead she was sched to see his nurse/neutroitionist and appt is Wed.  Saw neurology Dr. Ernst Bowler at [facility] who said only option is to stop B6 and start copper.  Whenever she puts feet on floor they feel like they are going to explode.  Sometimes it moves to the hands and arms and so has been approved for an electric wheelchair - she has an appt with Dr. Loretta Plume for this. Has also been approved for the spinal cord stimulator for pain control - she is trying to get it scheduled where she gets a trial insertion to make sure it is going to work.   She is going to have to start paying for in house care  Pain to severe to stand on feet  Has resumed seeing Dr. Kyra Leyland.   HAD TO STOP PROTEIN DRINKS DUE TO B6 Medical History Past Medical History:  Diagnosis Date  . Allergy   . Anal fissure 10/08/2016  . Anemia    anemia in  the past  . Angina    takes Propanolol prn and it stops her chest  . Anxiety   . Barrett esophagus    not consistently present  . Cataract   . Chronic kidney disease    kidney stones and UTI  . Complication of anesthesia    either  BP or pulse dropped with last surgery  . DDD (degenerative disc disease)    cervical and lumbar  . Dementia ([place])   . Depression    post traumatic stress  disorder  . Diabetes ([place]) 11/18/2016  . Diabetes mellitus    sugar goes up and down diet controlled  . Dysrhythmia    brought on by stress  . External hemorrhoids   . Fatigue   . Fibromyalgia    neuropathy knees ankles and toes, bladder  . GERD (gastroesophageal reflux disease)   . H/O hyperparathyroidism   . Headache(784.0)    migraines  . Hiatal hernia   . History of kidney stones   . Hypertension    3 small brain aneurysms  . Hypoglycemia 11/08/2016  . IBS (irritable bowel syndrome)   . Internal hemorrhoids   . Macular degeneration   . Migraines   . Mitral regurgitation 12/11/2015  . Osteoarthritis    all  over  . Osteopetrosis   . Peripheral vascular disease (HCC)    superficial phlebitis in left calf  . Personal history of colonic polyps 07/22/2013   07/2013 - 3 small adenomas - repeat colonoscopy 2018  . Small intestinal bacterial overgrowth 08/05/2017   + lactulose breath test 06/2017 Xifaxan Rx   Past Surgical History:  Procedure Laterality Date  . ABDOMINAL HYSTERECTOMY    . APPENDECTOMY    . CARDIAC CATHETERIZATION  03/22/1991   EF 61%  . CARDIOVASCULAR STRESS TEST  10/03/2006  . CHOLECYSTECTOMY    . COLONOSCOPY  06/23/2008   normal  . DILATION AND CURETTAGE OF UTERUS     x2  . ESOPHAGUS SURGERY    . EXPLORATORY LAPAROTOMY  1993  . EYE SURGERY    . GASTRIC BYPASS  1999  . GASTRIC RESTRICTION SURGERY  1991  . LASIK Bilateral   . Right Arm Surgery    . Right Knee Arthroscopy    . Rt wrist fx  2009  . stomach stappeling  1991  . STONE EXTRACTION WITH BASKET  2012  .  TONSILLECTOMY    . TUBAL LIGATION    . UPPER GASTROINTESTINAL ENDOSCOPY  06/23/2008   w/Dil, Barrett's esophagus  . US ECHOCARDIOGRAPHY  01/21/2007   EF 55-60%  . US ECHOCARDIOGRAPHY  08/30/2003   EF 55-60%   Current Outpatient Medications on File Prior to Visit  Medication Sig Dispense Refill  . acarbose (PRECOSE) 25 MG tablet TAKE 1 TABLET (25 MG TOTAL) BY MOUTH 3 (THREE) TIMES DAILY WITH MEALS. 90 tablet 0  . aspirin-acetaminophen-caffeine (EXCEDRIN MIGRAINE) 250-250-65 MG tablet Take 2 tablets by mouth 2 (two) times daily.    . celecoxib (CELEBREX) 200 MG capsule Take 200 mg by mouth 2 (two) times daily.    . clonazePAM (KLONOPIN) 1 MG tablet Take 0.5-1 tablets (0.5-1 mg total) by mouth 2 (two) times daily as needed for anxiety. 180 tablet 1  . Continuous Blood Gluc Sensor (FREESTYLE LIBRE 14 DAY SENSOR) MISC 1 Units by Does not apply route every 14 (fourteen) days. 6 each 5  . cyclobenzaprine (FLEXERIL) 10 MG tablet Take 1 tablet (10 mg total) by mouth 3 (three) times daily as needed for muscle spasms. 270 tablet 1  . cycloSPORINE (RESTASIS) 0.05 % ophthalmic emulsion Place 1 drop into both eyes 2 (two) times daily. 5.5 mL 5  . diclofenac sodium (VOLTAREN) 1 % GEL Apply 2 g topically 4 (four) times daily. 700 g 3  . dicyclomine (BENTYL) 10 MG capsule Take 1-2 tab 3 times daily AC as needed for spasms and cramping. 270 capsule 1  . diltiazem 2 % GEL Apply 1 application topically 2 (two) times daily as needed (pain from fissure). 30 g 2  . docusate sodium (COLACE) 250 MG capsule Take 1 capsule (250 mg total) by mouth daily. 90 capsule 3  . DULoxetine (CYMBALTA) 30 MG capsule Take 3 capsules (90 mg total) by mouth daily. 90 capsule 5  . Erenumab-aooe (AIMOVIG) 70 MG/ML SOAJ Inject 70 mg into the skin every 30 (thirty) days. 1 pen 11  . esomeprazole (NEXIUM) 40 MG capsule Take 1 capsule (40 mg total) by mouth daily before breakfast. 90 capsule 3  . famotidine (PEPCID) 40 MG tablet Take 1 tablet  (40 mg total) by mouth at bedtime. 90 tablet 3  . fentaNYL (DURAGESIC - DOSED MCG/HR) 50 MCG/HR Place 50 mcg onto the skin every 3 (three) days.    . furosemide (LASIX) 40 MG tablet  Take 1 tablet (40 mg total) by mouth daily as needed for edema. 90 tablet 0  . HYDROcodone-acetaminophen (NORCO) 7.5-325 MG tablet Take 1 tablet every 6 (six) hours as needed by mouth for moderate pain.    . hypromellose (SYSTANE OVERNIGHT THERAPY) 0.3 % GEL ophthalmic ointment Place 1 drop into both eyes at bedtime. Reported on 02/11/2015    . IRON PO Take by mouth.    Marland Kitchen L-Methylfolate-Algae-B12-B6 (METANX PO) Take 2 mg by mouth every 12 (twelve) hours.    . lidocaine (LIDODERM) 5 % Place 1 patch onto the skin as needed. Remove & Discard patch within 12 hours. May dispense as 3 month supply 90 patch 3  . Lifitegrast (XIIDRA) 5 % SOLN Place 1 drop into both eyes daily.     Marland Kitchen lubiprostone (AMITIZA) 24 MCG capsule Take 1 capsule (24 mcg total) by mouth 2 (two) times daily with a meal. 180 capsule 3  . meloxicam (MOBIC) 15 MG tablet Take 1 tablet (15 mg total) by mouth daily. Prn pain 90 tablet 1  . metroNIDAZOLE (METROGEL) 1 % gel Apply topically daily. 45 g 0  . mirabegron ER (MYRBETRIQ) 25 MG TB24 tablet Take 1 tablet (25 mg total) by mouth daily. Office visit needed 90 tablet 0  . mupirocin ointment (BACTROBAN) 2 % Apply 1 application topically 3 (three) times daily. 30 g 1  . naloxegol oxalate (MOVANTIK) 25 MG TABS tablet Take 25 mg by mouth daily.    . Naloxone HCl 0.4 MG/0.4ML SOAJ Administer 0.6m [place] at first sign of opioid overdose and repeat every 2 minutes as needed for resuscitation. Call 911 immediately 5 Package 0  . Nerve Stimulator (CEFALY KIT) DEVI 20 Minutes by Does not apply route as directed. 1 Device 0  . NON FORMULARY [facility]  Peripheral Neuropathy Cream- Bupivacaine 1%, Doxepin 3%, Gabapentin 6%, Pentoxifylline 3%, Topiramate 1% Apply 1-2 grams to affected area 3-4 times daily Qty. 120  gm 3 refills    . ondansetron (ZOFRAN) 8 MG tablet Take 1 tablet (8 mg total) by mouth every 8 (eight) hours as needed for nausea or vomiting. 60 tablet 0  . ONE TOUCH LANCETS MISC Use to check cbgs 6 times daily as directed by physician. Testing freq: hypoglycemia, hyperglycemia. ICD10: R73.03, E 16.2 600 each 5  . potassium chloride SA (K-DUR,KLOR-CON) 20 MEQ tablet Take 1 tablet (20 mEq total) by mouth daily as needed (only take along with the furosemide). 90 tablet 0  . pregabalin (LYRICA) 50 MG capsule Take 1 capsule (50 mg total) by mouth 3 (three) times daily. (Patient taking differently: Take 50 mg by mouth 2 (two) times daily. ) 90 capsule 3  . Probiotic Product (PROBIOTIC-10 PO) Take by mouth.     . SUMAtriptan (IMITREX) 100 MG tablet Take 1 tablet at earliest onset of headache, may repeat in 2 hours if headache persists or reoccurs. (Patient taking differently: Take 100 mg by mouth every 3 (three) days. ) 30 tablet 1  . tizanidine (ZANAFLEX) 2 MG capsule TAKE 1 CAPSULE (2 MG TOTAL) BY MOUTH 3 (THREE) TIMES DAILY.    . traZODone (DESYREL) 100 MG tablet Take 0.5-1 tablets (50-100 mg total) by mouth at bedtime as needed for sleep. And 1/2 -1 if wakes at 4am. 90 tablet 1  . valACYclovir (VALTREX) 1000 MG tablet Take 1 tablet (1,000 mg total) by mouth 3 (three) times daily. 21 tablet 0  . [DISCONTINUED] buPROPion (WELLBUTRIN) 75 MG tablet Take 75 mg by mouth  daily after breakfast.      No current facility-administered medications on file prior to visit.    Allergies  Allergen Reactions  . Ambien [Zolpidem Tartrate]     Hallucinations  . Codeine Anaphylaxis  . Oxycodone-Acetaminophen Shortness Of Breath  . Tylox [Oxycodone-Acetaminophen] Anaphylaxis    Chest pain  . Darvocet [Propoxyphene N-Acetaminophen]     Chest pain  . Darvon [Propoxyphene] Itching  . Lorcet [Hydrocodone-Acetaminophen]     Chest pain  . Opium     hallucinations  . Vicodin [Hydrocodone-Acetaminophen] Other (See  Comments)    Chest Pain   . Wheat Bran   . Amoxicillin Nausea And Vomiting    Reaction:  Migraine headache  . Penicillins Nausea And Vomiting    Migraine Headaches   Family History  Problem Relation Age of Onset  . Stroke Mother   . Lung cancer Mother   . Heart disease Mother   . Diabetes Mother   . Hypertension Father   . Stroke Father   . Lung cancer Father   . Heart disease Father   . Kidney disease Father   . Seizures Father        epilepsy, and sisters x 2  . Pancreatic cancer Sister   . Lung cancer Sister   . Colon cancer Maternal Aunt        12 relatives  . Uterine cancer Other        aunt  . Heart disease Other        grandmother  . Clotting disorder Sister   . Heart disease Sister        x 2  . Kidney disease Sister        x 2  . Breast cancer Sister   . Colon cancer Paternal Aunt   . Colon cancer Paternal Aunt    Social History   Socioeconomic History  . Marital status: Married    Spouse name: Rushie Chestnut.  . Number of children: 2  . Years of education: Not on file  . Highest education level: Not on file  Occupational History  . Occupation: Disability    Employer: RETIRED  Social Needs  . Financial resource strain: Not on file  . Food insecurity:    Worry: Not on file    Inability: Not on file  . Transportation needs:    Medical: Not on file    Non-medical: Not on file  Tobacco Use  . Smoking status: Never Smoker  . Smokeless tobacco: Never Used  Substance and Sexual Activity  . Alcohol use: No  . Drug use: No  . Sexual activity: Not Currently  Lifestyle  . Physical activity:    Days per week: Not on file    Minutes per session: Not on file  . Stress: Not on file  Relationships  . Social connections:    Talks on phone: Not on file    Gets together: Not on file    Attends religious service: Not on file    Active member of club or organization: Not on file    Attends meetings of clubs or organizations: Not on file    Relationship status:  Not on file  Other Topics Concern  . Not on file  Social History Narrative   She retired on disability   Married   2 Children   Depression screen [facility] 2/9 12/29/2017 08/21/2017 08/02/2017 06/23/2017 04/21/2017  Decreased Interest 0 0 0 0 0  Down, Depressed, Hopeless 0 0 0 0 0  PHQ - 2 Score 0 0 0 0 0  Altered sleeping - - - - -  Tired, decreased energy - - - - -  Change in appetite - - - - -  Feeling bad or failure about yourself  - - - - -  Trouble concentrating - - - - -  Moving slowly or fidgety/restless - - - - -  Suicidal thoughts - - - - -  PHQ-9 Score - - - - -  Difficult doing work/chores - - - - -  Some recent data might be hidden    ROS As noted in HPI  Objective:  BP 104/64   Pulse 89   Temp 98 F (36.7 C) (Oral)   Resp 16   Ht '5\' 4"'  (1.626 m)   Wt 99 lb 12.8 oz (45.3 kg)   SpO2 97%   BMI 17.13 kg/m  Physical Exam Constitutional:      General: She is not in acute distress.    Appearance: She is well-developed and well-groomed. She is cachectic. She is not diaphoretic.     Comments: Wearing compression hose, feet elevated onto seat of Rolator walker  HENT:     Head: Normocephalic and atraumatic.     Right Ear: External ear normal.     Left Ear: External ear normal.  Eyes:     General: No scleral icterus.    Conjunctiva/sclera: Conjunctivae normal.  Neck:     Musculoskeletal: Normal range of motion and neck supple.     Thyroid: No thyromegaly.  Cardiovascular:     Rate and Rhythm: Normal rate and regular rhythm.     Heart sounds: Normal heart sounds.  Pulmonary:     Effort: Pulmonary effort is normal. No respiratory distress.     Breath sounds: Normal breath sounds.  Lymphadenopathy:     Cervical: No cervical adenopathy.  Skin:    General: Skin is warm and dry.     Findings: No erythema.  Neurological:     Mental Status: She is alert and oriented to person, place, and time.  Psychiatric:        Behavior: Behavior normal.     POC TESTING No  visits with results within 3 Day(s) from this visit.  Latest known visit with results is:  Office Visit on 12/26/2017  Component Date Value Ref Range Status  . Hemoglobin A1C 12/26/2017 5.1  4.0 - 5.6 % Final     Assessment & Plan:   1. 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 Type 2 diabetes mellitus with hypoglycemia without coma, without long-term current use of insulin (HCC)   3. Reactive hypoglycemia   4. Hyperglycemia   5. Weight loss, unintentional   6. Loss of weight   7. Excessive vitamin B6 intake   8. Vitamin B6 induced neuropathy ([place])   9. Peripheral vascular disease ([place])   10. Age-related osteoporosis without current pathological fracture   11. H/O gastric bypass   12. H/O hyperparathyroidism     Patient will continue on current chronic medications other than changes noted above, so ok to refill when needed.   See after visit summary for patient specific instructions.  Orders Placed This Encounter  Procedures  . Flu vaccine HIGH DOSE PF (Fluzone High dose)  . Microalbumin / creatinine urine ratio  . Lipid panel    Order Specific Question:   Has the patient fasted?    Answer:   Yes  . Vitamin B6  . Copper, Serum  .  Vitamin B12  . Comprehensive metabolic panel    Order Specific Question:   Has the patient fasted?    Answer:   Yes  . CBC with Differential/Platelet  . Ambulatory referral to Physical Therapy    Referral Priority:   Routine    Referral Type:   Physical Medicine    Referral Reason:   Specialty Services Required    Requested Specialty:   Physical Therapy    Number of Visits Requested:   1  . Ambulatory referral to Endocrinology    Referral Priority:   Routine    Referral Type:   Consultation    Referral Reason:   Specialty Services Required    Number of Visits Requested:   1  . POCT Microscopic Urinalysis ([facility])    Meds ordered this encounter  Medications  . Zoster Vaccine Adjuvanted [facility]) injection    Sig: Inject 0.5 mLs into  the muscle once for 1 dose. Repeat once in 2 to 6 months    Dispense:  1 each    Refill:  1    Patient verbalized to me that they understand the following: diagnosis, what is being done for them, what to expect and what should be done at home.  Their questions have been answered. They understand that I am unable to predict every possible medication interaction or adverse outcome and that if any unexpected symptoms arise, they should contact us and their pharmacist, as well as never hesitate to seek urgent/emergent care at [facility] or ER if they think it might be warranted.    Over 40 min spent in face-to-face evaluation of and consultation with patient and coordination of care.  Over 50% of this time was spent counseling this patient regarding above.   [name], MD, MPH [facility] [address] [place], [place]  [address] [phone] Office phone  [phone] Office fax  12/29/17 11:38 AM

## 2017-12-29 NOTE — Progress Notes (Signed)
NEUROLOGY FOLLOW UP OFFICE NOTE  Kayla [name] [id number]  HISTORY OF PRESENT ILLNESS: Kayla [name] is a 72 year old woman with fibromyalgia, anxiety with history of PTSD, degenerative disc disease of cervical and lumbar spine, and history of nephrolithiasis and gastric bypass who follows up for need for a power wheelchair regarding her polyneuropathy.  She is accompanied by her husband.  She recently underwent pre-surgical evaluation for spinal stimulator trial.  She will follow up with them to discuss next steps.  She reports increased pain and numbness in all extremities.  She is unable to cook because she cannot stand too long due to pain.  She has trouble using her hands.  She tries to walk when she can but sometimes the pain is too great that she cannot ambulate.   She now notes paresthesias and pain have radiated up the arms and including the chest.  PAST MEDICAL HISTORY: Past Medical History:  Diagnosis Date  . Allergy   . Anal fissure 10/08/2016  . Anemia    anemia in the past  . Angina    takes Propanolol prn and it stops her chest  . Anxiety   . Barrett esophagus    not consistently present  . Cataract   . Chronic kidney disease    kidney stones and UTI  . Complication of anesthesia    either  BP or pulse dropped with last surgery  . DDD (degenerative disc disease)    cervical and lumbar  . Dementia ([place])   . Depression    post traumatic stress  disorder  . Diabetes ([place]) 11/18/2016  . Diabetes mellitus    sugar goes up and down diet controlled  . Dysrhythmia    brought on by stress  . External hemorrhoids   . Fatigue   . Fibromyalgia    neuropathy knees ankles and toes, bladder  . GERD (gastroesophageal reflux disease)   . H/O hyperparathyroidism   . Headache(784.0)    migraines  . Hiatal hernia   . History of kidney stones   . Hypertension    3 small brain aneurysms  . Hypoglycemia 11/08/2016  . IBS (irritable bowel syndrome)   . Internal  hemorrhoids   . Macular degeneration   . Migraines   . Mitral regurgitation 12/11/2015  . Osteoarthritis    all over  . Osteopetrosis   . Peripheral vascular disease ([facility])    superficial phlebitis in left calf  . Personal history of colonic polyps 07/22/2013   07/2013 - 3 small adenomas - repeat colonoscopy 2018  . Small intestinal bacterial overgrowth 08/05/2017   + lactulose breath test 06/2017 Xifaxan Rx    MEDICATIONS: Current Outpatient Medications on File Prior to Visit  Medication Sig Dispense Refill  . acarbose (PRECOSE) 25 MG tablet TAKE 1 TABLET (25 MG TOTAL) BY MOUTH 3 (THREE) TIMES DAILY WITH MEALS. 90 tablet 0  . aspirin-acetaminophen-caffeine (EXCEDRIN MIGRAINE) 250-250-65 MG tablet Take 2 tablets by mouth 2 (two) times daily.    . celecoxib (CELEBREX) 200 MG capsule Take 200 mg by mouth 2 (two) times daily.    . clonazePAM (KLONOPIN) 1 MG tablet Take 0.5-1 tablets (0.5-1 mg total) by mouth 2 (two) times daily as needed for anxiety. 180 tablet 1  . Continuous Blood Gluc Sensor (FREESTYLE LIBRE 14 DAY SENSOR) MISC 1 Units by Does not apply route every 14 (fourteen) days. 6 each 5  . cyclobenzaprine (FLEXERIL) 10 MG tablet Take 1 tablet (10 mg total) by mouth 3 (three)  times daily as needed for muscle spasms. 270 tablet 1  . cycloSPORINE (RESTASIS) 0.05 % ophthalmic emulsion Place 1 drop into both eyes 2 (two) times daily. 5.5 mL 5  . diclofenac sodium (VOLTAREN) 1 % GEL Apply 2 g topically 4 (four) times daily. 700 g 3  . dicyclomine (BENTYL) 10 MG capsule Take 1-2 tab 3 times daily AC as needed for spasms and cramping. 270 capsule 1  . diltiazem 2 % GEL Apply 1 application topically 2 (two) times daily as needed (pain from fissure). 30 g 2  . docusate sodium (COLACE) 250 MG capsule Take 1 capsule (250 mg total) by mouth daily. 90 capsule 3  . DULoxetine (CYMBALTA) 30 MG capsule Take 3 capsules (90 mg total) by mouth daily. 90 capsule 5  . Erenumab-aooe (AIMOVIG) 70 MG/ML SOAJ  Inject 70 mg into the skin every 30 (thirty) days. 1 pen 11  . esomeprazole (NEXIUM) 40 MG capsule Take 1 capsule (40 mg total) by mouth daily before breakfast. 90 capsule 3  . famotidine (PEPCID) 40 MG tablet Take 1 tablet (40 mg total) by mouth at bedtime. 90 tablet 3  . fentaNYL (DURAGESIC - DOSED MCG/HR) 50 MCG/HR Place 50 mcg onto the skin every 3 (three) days.    . furosemide (LASIX) 40 MG tablet Take 1 tablet (40 mg total) by mouth daily as needed for edema. (Patient not taking: Reported on 12/26/2017) 90 tablet 0  . HYDROcodone-acetaminophen (NORCO) 7.5-325 MG tablet Take 1 tablet every 6 (six) hours as needed by mouth for moderate pain.    . hypromellose (SYSTANE OVERNIGHT THERAPY) 0.3 % GEL ophthalmic ointment Place 1 drop into both eyes at bedtime. Reported on 02/11/2015    . IRON PO Take by mouth.    Marland Kitchen L-Methylfolate-Algae-B12-B6 (METANX PO) Take 2 mg by mouth every 12 (twelve) hours.    . lidocaine (LIDODERM) 5 % Place 1 patch onto the skin as needed. Remove & Discard patch within 12 hours. May dispense as 3 month supply 90 patch 3  . Lifitegrast (XIIDRA) 5 % SOLN Place 1 drop into both eyes daily.     Marland Kitchen lubiprostone (AMITIZA) 24 MCG capsule Take 1 capsule (24 mcg total) by mouth 2 (two) times daily with a meal. 180 capsule 3  . meloxicam (MOBIC) 15 MG tablet Take 1 tablet (15 mg total) by mouth daily. Prn pain 90 tablet 1  . metroNIDAZOLE (METROGEL) 1 % gel Apply topically daily. 45 g 0  . mirabegron ER (MYRBETRIQ) 25 MG TB24 tablet Take 1 tablet (25 mg total) by mouth daily. Office visit needed 90 tablet 0  . mupirocin ointment (BACTROBAN) 2 % Apply 1 application topically 3 (three) times daily. 30 g 1  . naloxegol oxalate (MOVANTIK) 25 MG TABS tablet Take 25 mg by mouth daily.    . Naloxone HCl 0.4 MG/0.4ML SOAJ Administer 0.30m [place] at first sign of opioid overdose and repeat every 2 minutes as needed for resuscitation. Call 911 immediately 5 Package 0  . Nerve Stimulator (CEFALY KIT)  DEVI 20 Minutes by Does not apply route as directed. 1 Device 0  . NON FORMULARY [facility]  Peripheral Neuropathy Cream- Bupivacaine 1%, Doxepin 3%, Gabapentin 6%, Pentoxifylline 3%, Topiramate 1% Apply 1-2 grams to affected area 3-4 times daily Qty. 120 gm 3 refills    . ondansetron (ZOFRAN) 8 MG tablet Take 1 tablet (8 mg total) by mouth every 8 (eight) hours as needed for nausea or vomiting. 60 tablet 0  .  ONE TOUCH LANCETS MISC Use to check cbgs 6 times daily as directed by physician. Testing freq: hypoglycemia, hyperglycemia. ICD10: R73.03, E 16.2 600 each 5  . potassium chloride SA (K-DUR,KLOR-CON) 20 MEQ tablet Take 1 tablet (20 mEq total) by mouth daily as needed (only take along with the furosemide). (Patient not taking: Reported on 12/26/2017) 90 tablet 0  . pregabalin (LYRICA) 50 MG capsule Take 1 capsule (50 mg total) by mouth 3 (three) times daily. (Patient taking differently: Take 50 mg by mouth 2 (two) times daily. ) 90 capsule 3  . Probiotic Product (PROBIOTIC-10 PO) Take by mouth.     . SUMAtriptan (IMITREX) 100 MG tablet Take 1 tablet at earliest onset of headache, may repeat in 2 hours if headache persists or reoccurs. (Patient taking differently: Take 100 mg by mouth every 3 (three) days. ) 30 tablet 1  . tizanidine (ZANAFLEX) 2 MG capsule TAKE 1 CAPSULE (2 MG TOTAL) BY MOUTH 3 (THREE) TIMES DAILY.    . traZODone (DESYREL) 100 MG tablet Take 0.5-1 tablets (50-100 mg total) by mouth at bedtime as needed for sleep. And 1/2 -1 if wakes at 4am. 90 tablet 1  . valACYclovir (VALTREX) 1000 MG tablet Take 1 tablet (1,000 mg total) by mouth 3 (three) times daily. 21 tablet 0  . [DISCONTINUED] buPROPion (WELLBUTRIN) 75 MG tablet Take 75 mg by mouth daily after breakfast.      No current facility-administered medications on file prior to visit.     ALLERGIES: Allergies  Allergen Reactions  . Ambien [Zolpidem Tartrate]     Hallucinations  . Codeine Anaphylaxis  .  Oxycodone-Acetaminophen Shortness Of Breath  . Tylox [Oxycodone-Acetaminophen] Anaphylaxis    Chest pain  . Darvocet [Propoxyphene N-Acetaminophen]     Chest pain  . Darvon [Propoxyphene] Itching  . Lorcet [Hydrocodone-Acetaminophen]     Chest pain  . Opium     hallucinations  . Vicodin [Hydrocodone-Acetaminophen] Other (See Comments)    Chest Pain   . Wheat Bran   . Amoxicillin Nausea And Vomiting    Reaction:  Migraine headache  . Penicillins Nausea And Vomiting    Migraine Headaches    FAMILY HISTORY: Family History  Problem Relation Age of Onset  . Stroke Mother   . Lung cancer Mother   . Heart disease Mother   . Diabetes Mother   . Hypertension Father   . Stroke Father   . Lung cancer Father   . Heart disease Father   . Kidney disease Father   . Seizures Father        epilepsy, and sisters x 2  . Pancreatic cancer Sister   . Lung cancer Sister   . Colon cancer Maternal Aunt        12 relatives  . Uterine cancer Unknown        aunt  . Heart disease Unknown        grandmother  . Clotting disorder Sister   . Heart disease Sister        x 2  . Kidney disease Sister        x 2  . Breast cancer Sister   . Colon cancer Paternal Aunt   . Colon cancer Paternal Aunt     SOCIAL HISTORY: Social History   Socioeconomic History  . Marital status: Married    Spouse name: Rushie Chestnut.  . Number of children: 2  . Years of education: Not on file  . Highest education level: Not on file  Occupational History  . Occupation: Disability    Employer: RETIRED  Social Needs  . Financial resource strain: Not on file  . Food insecurity:    Worry: Not on file    Inability: Not on file  . Transportation needs:    Medical: Not on file    Non-medical: Not on file  Tobacco Use  . Smoking status: Never Smoker  . Smokeless tobacco: Never Used  Substance and Sexual Activity  . Alcohol use: No  . Drug use: No  . Sexual activity: Not Currently  Lifestyle  . Physical  activity:    Days per week: Not on file    Minutes per session: Not on file  . Stress: Not on file  Relationships  . Social connections:    Talks on phone: Not on file    Gets together: Not on file    Attends religious service: Not on file    Active member of club or organization: Not on file    Attends meetings of clubs or organizations: Not on file    Relationship status: Not on file  . Intimate partner violence:    Fear of current or ex partner: Not on file    Emotionally abused: Not on file    Physically abused: Not on file    Forced sexual activity: Not on file  Other Topics Concern  . Not on file  Social History Narrative   She retired on disability   Married   2 Children    REVIEW OF SYSTEMS: Constitutional: fatigue, loss of appetite Eyes: No visual changes, double vision, eye pain Ear, nose and throat: No hearing loss, ear pain, nasal congestion, sore throat Cardiovascular: No chest pain, palpitations Respiratory:  No shortness of breath at rest or with exertion, wheezes GastrointestinaI: No nausea, vomiting, diarrhea, abdominal pain, fecal incontinence Genitourinary:  No dysuria, urinary retention or frequency Musculoskeletal:  Diffuse pain Integumentary: No rash, pruritus, skin lesions Neurological: as above Psychiatric: No depression, insomnia, anxiety Endocrine: weight loss, fatigue Hematologic/Lymphatic:  No purpura, petechiae. Allergic/Immunologic: no itchy/runny eyes, nasal congestion, recent allergic reactions, rashes  PHYSICAL EXAM: Blood pressure 94/62, pulse 94, height '5\' 5"'  (1.651 m), weight 100 lb 9.6 oz (45.6 kg), SpO2 98 %. General: No acute distress.  Patient appears well-groomed.  thin body habitus. Head:  Normocephalic/atraumatic Eyes:  Fundi examined but not visualized Neck: supple, no paraspinal tenderness, full range of motion Heart:  Regular rate and rhythm Lungs:  Clear to auscultation bilaterally Back: No paraspinal  tenderness Neurological Exam: alert and oriented to person, place, and time. Attention span and concentration intact, recent and remote memory intact, fund of knowledge intact.  Speech fluent and not dysarthric, language intact.  CN II-XII intact. Bulk and tone normal, muscle strength 5-/5 proximal upper and lower extremities, otherwise 5/5 throughout.  Sensation to pinprick reduced in fingers and feet up to below the knees.  Vibration sensation reduced in feet..  Deep tendon reflexes 2+ throughout, toes downgoing.  Finger to nose and heel to shin testing intact.  Gait cautious.  Romberg with sway.  IMPRESSION: Idiopathic polyneuropathy with chronic pain.  I have reviewed and concur with the physical therapy evaluation.  I believe that Mrs. Bardsley needs a power wheelchair for in the home so that she may able to navigate throughout the house on the ground floor.  It would assist in getting to the toilet or kitchen to eat.  It would help her cook in the kitchen because she would not need  to stand for prolonged period of time.  A power wheelchair is ideal because the neuropathy limits her use of her hands.   25 minutes spent face to face with patient, over 50% spent discussing exam findings and plan.  Metta Clines, DO  CC:  [name], MD

## 2017-12-30 ENCOUNTER — Encounter: Payer: Self-pay | Admitting: Neurology

## 2017-12-30 ENCOUNTER — Ambulatory Visit (INDEPENDENT_AMBULATORY_CARE_PROVIDER_SITE_OTHER): Payer: Medicare Other | Admitting: Neurology

## 2017-12-30 VITALS — BP 94/62 | HR 94 | Ht 65.0 in | Wt 100.6 lb

## 2017-12-30 DIAGNOSIS — G894 Chronic pain syndrome: Secondary | ICD-10-CM

## 2017-12-30 DIAGNOSIS — R531 Weakness: Secondary | ICD-10-CM | POA: Diagnosis not present

## 2017-12-30 DIAGNOSIS — G609 Hereditary and idiopathic neuropathy, unspecified: Secondary | ICD-10-CM | POA: Diagnosis not present

## 2017-12-30 LAB — MICROALBUMIN / CREATININE URINE RATIO
Creatinine, Urine: 164.5 mg/dL
Microalb/Creat Ratio: 3.9 mg/g creat (ref 0.0–30.0)
Microalbumin, Urine: 6.4 ug/mL

## 2017-12-30 NOTE — Patient Instructions (Addendum)
1.  Will submit for motorized wheelchair 2.  Follow up in 6 months.

## 2017-12-31 ENCOUNTER — Other Ambulatory Visit: Payer: Self-pay

## 2017-12-31 ENCOUNTER — Encounter: Payer: Medicare Other | Attending: Endocrinology | Admitting: Nutrition

## 2017-12-31 DIAGNOSIS — E119 Type 2 diabetes mellitus without complications: Secondary | ICD-10-CM | POA: Insufficient documentation

## 2017-12-31 DIAGNOSIS — Z681 Body mass index (BMI) 19 or less, adult: Secondary | ICD-10-CM | POA: Diagnosis not present

## 2017-12-31 DIAGNOSIS — Z713 Dietary counseling and surveillance: Secondary | ICD-10-CM | POA: Insufficient documentation

## 2017-12-31 DIAGNOSIS — E162 Hypoglycemia, unspecified: Secondary | ICD-10-CM

## 2017-12-31 NOTE — Telephone Encounter (Signed)
Please place a referral to [facility]. I will be happy to call pt and let her know the referral process.

## 2017-12-31 NOTE — Telephone Encounter (Signed)
done

## 2017-12-31 NOTE — Telephone Encounter (Signed)
It appears that referral was placed for a [facility] location and with Leonia Reader. Unfortunately, in order for it to get in to the correct work que and for the patient to be called by our referral coordinator, you will need to discontinue that order and order for [facility]. Thank you

## 2017-12-31 NOTE — Telephone Encounter (Signed)
Was placed on 12/26/17, with a note: pt requests [facility].

## 2017-12-31 NOTE — Progress Notes (Signed)
error 

## 2017-12-31 NOTE — Progress Notes (Signed)
Patients reports that she is loosing weight due to not being able to stand for long periods of time to cook a meal.  Husband is disabled.  Says blood sugars drop low if she "eats anything".  She says that her doctor told her to stop the Acarbose,  But Dr. Loanne Drilling said no.  She is using a Libre sensor and it shows average blood sugars: 92 with in the 60s.  She also reports bouts of nausea with larger meals.  She is post gastric bypass and can not eat large amounts.  She has stopped eating all carbs except for supper meal.  Typical day: 4AM up  FBSs 90-98,  5AM:  Coffee with cream.  No blood sugar rise 3-4PM: piece of meat:  3-4 ounces skinless chicken, or port loin.  Sometimes salad with raw veg., and oil and vinegar.   6PM: Lean cuisseen.  Will ususally eat all of the meat and 1/2 of green beens and a few potatoes.   8:30PM:  Small apple and cup of coffee 9PM: asleep Drinks water all day long and during the night when she is awake.   Denies eating between meals. Says when she eats any carbs, she will get weak and shakey.  Discussed the need for more carbs, and to eat q 3-4 hours.  Suggested she eat 5-7 carbs starting at 6AM with some protein.  Suggestions given for this:  1 graham cracker square with 2 T of peanut butter  And then to eat again at 9AM another 5 more grams of carb that are higher in fat like chocolate, or 1/2 scoop of sugar free icecream.  She agreed to do this.  Explained that the higher fat carbs will help with weight gain and slow blood sugar rise.  Suggested that we try for a 1 pound weight gain in 2 weeks.  Since sometimes she gets nauseate if eating a whole TV dinner, suggested she eat only 1/2 of the carbs, and protein, because she will be eating more during the day, and limit the carbs at lunch to 2 ounces.  This will total approx. 8 ounces per day.  She agreed toreturn with a food record and to log if symptoms of low blood sugar occur after certain meals. She agreed to do this,  and will return in 2 weeks.

## 2017-12-31 NOTE — Patient Instructions (Addendum)
Have breakfast of 5 grams of carb with 7 grams of protein.  Have another 5-7 grams of carb in 3 hours after breakfast Have 5-10 grams of carb at lunch Have 15 carbs at supper Have 15 carbs at bedtime Document all low blood sugar symptoms with a blood sugar reading Keep a food record and record if low blood sugar symptoms occur.

## 2017-12-31 NOTE — Addendum Note (Signed)
Addended by: Renato Shin on: 12/31/2017 04:43 PM   Modules accepted: Orders

## 2018-01-01 ENCOUNTER — Ambulatory Visit: Payer: Medicare Other | Admitting: Psychiatry

## 2018-01-01 LAB — COMPREHENSIVE METABOLIC PANEL
ALT: 31 IU/L (ref 0–32)
AST: 30 IU/L (ref 0–40)
Albumin/Globulin Ratio: 2 (ref 1.2–2.2)
Albumin: 4.2 g/dL (ref 3.5–4.8)
Alkaline Phosphatase: 87 IU/L (ref 39–117)
BUN/Creatinine Ratio: 40 — ABNORMAL HIGH (ref 12–28)
BUN: 25 mg/dL (ref 8–27)
Bilirubin Total: 0.3 mg/dL (ref 0.0–1.2)
CO2: 26 mmol/L (ref 20–29)
Calcium: 9.6 mg/dL (ref 8.7–10.3)
Chloride: 103 mmol/L (ref 96–106)
Creatinine, Ser: 0.63 mg/dL (ref 0.57–1.00)
GFR calc Af Amer: 105 mL/min/{1.73_m2} (ref >59)
GFR calc non Af Amer: 91 mL/min/{1.73_m2} (ref >59)
Globulin, Total: 2.1 g/dL (ref 1.5–4.5)
Glucose: 93 mg/dL (ref 65–99)
Potassium: 4.5 mmol/L (ref 3.5–5.2)
Sodium: 145 mmol/L — ABNORMAL HIGH (ref 134–144)
Total Protein: 6.3 g/dL (ref 6.0–8.5)

## 2018-01-01 LAB — CBC WITH DIFFERENTIAL/PLATELET
Basophils Absolute: 0 10*3/uL (ref 0.0–0.2)
Basos: 1 %
EOS (ABSOLUTE): 0.2 10*3/uL (ref 0.0–0.4)
Eos: 3 %
Hematocrit: 39.3 % (ref 34.0–46.6)
Hemoglobin: 13.2 g/dL (ref 11.1–15.9)
Immature Grans (Abs): 0 10*3/uL (ref 0.0–0.1)
Immature Granulocytes: 0 %
Lymphocytes Absolute: 2.2 10*3/uL (ref 0.7–3.1)
Lymphs: 35 %
MCH: 30.5 pg (ref 26.6–33.0)
MCHC: 33.6 g/dL (ref 31.5–35.7)
MCV: 91 fL (ref 79–97)
MONOS ABS: 0.5 10*3/uL (ref 0.1–0.9)
Monocytes: 7 %
NEUTROS PCT: 54 %
Neutrophils Absolute: 3.5 10*3/uL (ref 1.4–7.0)
Platelets: 330 10*3/uL (ref 150–450)
RBC: 4.33 x10E6/uL (ref 3.77–5.28)
RDW: 13 % (ref 12.3–15.4)
WBC: 6.5 10*3/uL (ref 3.4–10.8)

## 2018-01-01 LAB — LIPID PANEL
Chol/HDL Ratio: 2.4 ratio (ref 0.0–4.4)
Cholesterol, Total: 189 mg/dL (ref 100–199)
HDL: 80 mg/dL (ref >39)
LDL Calculated: 92 mg/dL (ref 0–99)
Triglycerides: 87 mg/dL (ref 0–149)
VLDL Cholesterol Cal: 17 mg/dL (ref 5–40)

## 2018-01-01 LAB — COPPER, SERUM: Copper: 98 ug/dL (ref 72–166)

## 2018-01-01 LAB — VITAMIN B6: Vitamin B6: 82.4 ug/L — ABNORMAL HIGH (ref 2.0–32.8)

## 2018-01-01 LAB — VITAMIN B12: Vitamin B-12: >2000 pg/mL — ABNORMAL HIGH (ref 232–1245)

## 2018-01-01 NOTE — Telephone Encounter (Signed)
Called pt and informed her of referrals/scheduling process. Verbalized acceptance and understanding.

## 2018-01-08 ENCOUNTER — Encounter: Payer: Self-pay | Admitting: Family Medicine

## 2018-01-12 ENCOUNTER — Telehealth: Payer: Self-pay | Admitting: Family Medicine

## 2018-01-12 ENCOUNTER — Encounter: Payer: Self-pay | Admitting: Family Medicine

## 2018-01-12 NOTE — Telephone Encounter (Signed)
Copied from [place] [phone]. Topic: General - Other >> Jan 12, 2018  2:29 PM Lennox Solders wrote: Reason for CRM: pt sent dr Brigitte Pulse a mychart message on 01-08-18 and would like dr Brigitte Pulse to return her call or reply through [facility]

## 2018-01-12 NOTE — Telephone Encounter (Unsigned)
Copied from [place] [phone]. Topic: Referral - Question >> Jan 12, 2018  2:24 PM Lennox Solders wrote: Reason for CRM: [facility] can not treat pt for vit b6 nor copper. [facility] can treat her for diabetes. Pt send a new referral to [facility] end[facility]inologist. Please fax to [phone]

## 2018-01-13 ENCOUNTER — Ambulatory Visit (INDEPENDENT_AMBULATORY_CARE_PROVIDER_SITE_OTHER): Payer: Medicare Other | Admitting: Psychiatry

## 2018-01-13 DIAGNOSIS — F331 Major depressive disorder, recurrent, moderate: Secondary | ICD-10-CM

## 2018-01-20 ENCOUNTER — Ambulatory Visit (INDEPENDENT_AMBULATORY_CARE_PROVIDER_SITE_OTHER): Payer: Medicare Other | Admitting: Psychiatry

## 2018-01-20 ENCOUNTER — Other Ambulatory Visit: Payer: Self-pay | Admitting: Endocrinology

## 2018-01-20 ENCOUNTER — Telehealth: Payer: Self-pay | Admitting: Family Medicine

## 2018-01-20 DIAGNOSIS — F331 Major depressive disorder, recurrent, moderate: Secondary | ICD-10-CM

## 2018-01-20 NOTE — Telephone Encounter (Signed)
Sent MyChart message to pt. When pt calls in, please reschedule at their convenience. Thank you!

## 2018-01-20 NOTE — Telephone Encounter (Signed)
Patient wants to come in before her appointment and get blood work done--since Dr Brigitte Pulse is out for a while she wanted a nurse to call her and see if they could talk to her about what labs she wants and  See if another provider could order them for her.  Can someone please give her a call to discuss.

## 2018-01-21 ENCOUNTER — Other Ambulatory Visit: Payer: Self-pay | Admitting: *Deleted

## 2018-01-21 ENCOUNTER — Telehealth: Payer: Self-pay | Admitting: Family Medicine

## 2018-01-21 ENCOUNTER — Ambulatory Visit: Payer: Self-pay | Admitting: Nutrition

## 2018-01-21 DIAGNOSIS — E11649 Type 2 diabetes mellitus with hypoglycemia without coma: Secondary | ICD-10-CM

## 2018-01-21 NOTE — Progress Notes (Signed)
Referral put in for [facility] endocrinology

## 2018-01-21 NOTE — Telephone Encounter (Signed)
Referral done

## 2018-01-21 NOTE — Telephone Encounter (Signed)
Pt stated that she is in need of a wheelchair and Dr. Brigitte Pulse though she would get denied but pt stated that Dr. Brigitte Pulse can call Andria Rhein from [facility] at [phone] and she can help her get pt's that get denied for home care appliances.

## 2018-01-22 ENCOUNTER — Other Ambulatory Visit: Payer: Self-pay

## 2018-01-22 ENCOUNTER — Encounter: Payer: Self-pay | Admitting: Emergency Medicine

## 2018-01-22 ENCOUNTER — Inpatient Hospital Stay: Payer: Medicare Other | Admitting: Family Medicine

## 2018-01-22 ENCOUNTER — Ambulatory Visit (INDEPENDENT_AMBULATORY_CARE_PROVIDER_SITE_OTHER): Payer: Medicare Other | Admitting: Emergency Medicine

## 2018-01-22 VITALS — BP 113/58 | HR 89 | Temp 98.8°F | Resp 16 | Ht 65.0 in | Wt 103.6 lb

## 2018-01-22 DIAGNOSIS — G894 Chronic pain syndrome: Secondary | ICD-10-CM | POA: Diagnosis not present

## 2018-01-22 DIAGNOSIS — E11649 Type 2 diabetes mellitus with hypoglycemia without coma: Secondary | ICD-10-CM | POA: Diagnosis not present

## 2018-01-22 DIAGNOSIS — R634 Abnormal weight loss: Secondary | ICD-10-CM

## 2018-01-22 NOTE — Telephone Encounter (Signed)
f °

## 2018-01-22 NOTE — Patient Instructions (Addendum)
If you have lab work done today you will be contacted with your lab results within the next 2 weeks.  If you have not heard from [place] then please contact us. The fastest way to get your results is to register for My Chart.   IF you received an x-ray today, you will receive an invoice from [facility]. Please contact [facility] at [phone] with questions or concerns regarding your invoice.   IF you received labwork today, you will receive an invoice from [place]. Please contact [facility] at [phone] with questions or concerns regarding your invoice.   Our billing staff will not be able to assist you with questions regarding bills from these companies.  You will be contacted with the lab results as soon as they are available. The fastest way to get your results is to activate your My Chart account. Instructions are located on the last page of this paperwork. If you have not heard from [place] regarding the results in 2 weeks, please contact this office.    Health Maintenance, Female Adopting a healthy lifestyle and getting preventive care can go a long way to promote health and wellness. Talk with your health care provider about what schedule of regular examinations is right for you. This is a good chance for you to check in with your provider about disease prevention and staying healthy. In between checkups, there are plenty of things you can do on your own. Experts have done a lot of research about which lifestyle changes and preventive measures are most likely to keep you healthy. Ask your health care provider for more information. Weight and diet Eat a healthy diet  Be sure to include plenty of vegetables, fruits, low-fat dairy products, and lean protein.  Do not eat a lot of foods high in solid fats, added sugars, or salt.  Get regular exercise. This is one of the most important things you can do for your health. ? Most adults should exercise for at least 150  minutes each week. The exercise should increase your heart rate and make you sweat (moderate-intensity exercise). ? Most adults should also do strengthening exercises at least twice a week. This is in addition to the moderate-intensity exercise. Maintain a healthy weight  Body mass index (BMI) is a measurement that can be used to identify possible weight problems. It estimates body fat based on height and weight. Your health care provider can help determine your BMI and help you achieve or maintain a healthy weight.  For females 12 years of age and older: ? A BMI below 18.5 is considered underweight. ? A BMI of 18.5 to 24.9 is normal. ? A BMI of 25 to 29.9 is considered overweight. ? A BMI of 30 and above is considered obese. Watch levels of cholesterol and blood lipids  You should start having your blood tested for lipids and cholesterol at 71 years of age, then have this test every 5 years.  You may need to have your cholesterol levels checked more often if: ? Your lipid or cholesterol levels are high. ? You are older than 71 years of age. ? You are at high risk for heart disease. Cancer screening Lung Cancer  Lung cancer screening is recommended for adults 58-36 years old who are at high risk for lung cancer because of a history of smoking.  A yearly low-dose CT scan of the lungs is recommended for people who: ? Currently smoke. ? Have quit within the past 15  years. ? Have at least a 30-pack-year history of smoking. A pack year is smoking an average of one pack of cigarettes a day for 1 year.  Yearly screening should continue until it has been 15 years since you quit.  Yearly screening should stop if you develop a health problem that would prevent you from having lung cancer treatment. Breast Cancer  Practice breast self-awareness. This means understanding how your breasts normally appear and feel.  It also means doing regular breast self-exams. Let your health care provider  know about any changes, no matter how small.  If you are in your 20s or 30s, you should have a clinical breast exam (CBE) by a health care provider every 1-3 years as part of a regular health exam.  If you are 3 or older, have a CBE every year. Also consider having a breast X-ray (mammogram) every year.  If you have a family history of breast cancer, talk to your health care provider about genetic screening.  If you are at high risk for breast cancer, talk to your health care provider about having an MRI and a mammogram every year.  Breast cancer gene (BRCA) assessment is recommended for women who have family members with BRCA-related cancers. BRCA-related cancers include: ? Breast. ? Ovarian. ? Tubal. ? Peritoneal cancers.  Results of the assessment will determine the need for genetic counseling and BRCA1 and BRCA2 testing. Cervical Cancer Your health care provider may recommend that you be screened regularly for cancer of the pelvic organs (ovaries, uterus, and vagina). This screening involves a pelvic examination, including checking for microscopic changes to the surface of your cervix (Pap test). You may be encouraged to have this screening done every 3 years, beginning at age 63.  For women ages 71-65, health care providers may recommend pelvic exams and Pap testing every 3 years, or they may recommend the Pap and pelvic exam, combined with testing for human papilloma virus (HPV), every 5 years. Some types of HPV increase your risk of cervical cancer. Testing for HPV may also be done on women of any age with unclear Pap test results.  Other health care providers may not recommend any screening for nonpregnant women who are considered low risk for pelvic cancer and who do not have symptoms. Ask your health care provider if a screening pelvic exam is right for you.  If you have had past treatment for cervical cancer or a condition that could lead to cancer, you need Pap tests and  screening for cancer for at least 20 years after your treatment. If Pap tests have been discontinued, your risk factors (such as having a new sexual partner) need to be reassessed to determine if screening should resume. Some women have medical problems that increase the chance of getting cervical cancer. In these cases, your health care provider may recommend more frequent screening and Pap tests. Colorectal Cancer  This type of cancer can be detected and often prevented.  Routine colorectal cancer screening usually begins at 71 years of age and continues through 71 years of age.  Your health care provider may recommend screening at an earlier age if you have risk factors for colon cancer.  Your health care provider may also recommend using home test kits to check for hidden blood in the stool.  A small camera at the end of a tube can be used to examine your colon directly (sigmoidoscopy or colonoscopy). This is done to check for the earliest forms of colorectal cancer.  Routine screening usually begins at age 50.  Direct examination of the colon should be repeated every 5-10 years through 71 years of age. However, you may need to be screened more often if early forms of precancerous polyps or small growths are found. Skin Cancer  Check your skin from head to toe regularly.  Tell your health care provider about any new moles or changes in moles, especially if there is a change in a mole's shape or color.  Also tell your health care provider if you have a mole that is larger than the size of a pencil eraser.  Always use sunscreen. Apply sunscreen liberally and repeatedly throughout the day.  Protect yourself by wearing long sleeves, pants, a wide-brimmed hat, and sunglasses whenever you are outside. Heart disease, diabetes, and high blood pressure  High blood pressure causes heart disease and increases the risk of stroke. High blood pressure is more likely to develop in: ? People who  have blood pressure in the high end of the normal range (130-139/85-89 mm Hg). ? People who are overweight or obese. ? People who are African American.  If you are 18-39 years of age, have your blood pressure checked every 3-5 years. If you are 40 years of age or older, have your blood pressure checked every year. You should have your blood pressure measured twice-once when you are at a hospital or clinic, and once when you are not at a hospital or clinic. Record the average of the two measurements. To check your blood pressure when you are not at a hospital or clinic, you can use: ? An automated blood pressure machine at a pharmacy. ? A home blood pressure monitor.  If you are between 55 years and 79 years old, ask your health care provider if you should take aspirin to prevent strokes.  Have regular diabetes screenings. This involves taking a blood sample to check your fasting blood sugar level. ? If you are at a normal weight and have a low risk for diabetes, have this test once every three years after 71 years of age. ? If you are overweight and have a high risk for diabetes, consider being tested at a younger age or more often. Preventing infection Hepatitis B  If you have a higher risk for hepatitis B, you should be screened for this virus. You are considered at high risk for hepatitis B if: ? You were born in a country where hepatitis B is common. Ask your health care provider which countries are considered high risk. ? Your parents were born in a high-risk country, and you have not been immunized against hepatitis B (hepatitis B vaccine). ? You have HIV or AIDS. ? You use needles to inject street drugs. ? You live with someone who has hepatitis B. ? You have had sex with someone who has hepatitis B. ? You get hemodialysis treatment. ? You take certain medicines for conditions, including cancer, organ transplantation, and autoimmune conditions. Hepatitis C  Blood testing is  recommended for: ? Everyone born from 1945 through 1965. ? Anyone with known risk factors for hepatitis C. Sexually transmitted infections (STIs)  You should be screened for sexually transmitted infections (STIs) including gonorrhea and chlamydia if: ? You are sexually active and are younger than 71 years of age. ? You are older than 71 years of age and your health care provider tells you that you are at risk for this type of infection. ? Your sexual activity has changed since you   were last screened and you are at an increased risk for chlamydia or gonorrhea. Ask your health care provider if you are at risk.  If you do not have HIV, but are at risk, it may be recommended that you take a prescription medicine daily to prevent HIV infection. This is called pre-exposure prophylaxis (PrEP). You are considered at risk if: ? You are sexually active and do not regularly use condoms or know the HIV status of your partner(s). ? You take drugs by injection. ? You are sexually active with a partner who has HIV. Talk with your health care provider about whether you are at high risk of being infected with HIV. If you choose to begin PrEP, you should first be tested for HIV. You should then be tested every 3 months for as long as you are taking PrEP. Pregnancy  If you are premenopausal and you may become pregnant, ask your health care provider about preconception counseling.  If you may become pregnant, take 400 to 800 micrograms (mcg) of folic acid every day.  If you want to prevent pregnancy, talk to your health care provider about birth control (contraception). Osteoporosis and menopause  Osteoporosis is a disease in which the bones lose minerals and strength with aging. This can result in serious bone fractures. Your risk for osteoporosis can be identified using a bone density scan.  If you are 65 years of age or older, or if you are at risk for osteoporosis and fractures, ask your health care  provider if you should be screened.  Ask your health care provider whether you should take a calcium or vitamin D supplement to lower your risk for osteoporosis.  Menopause may have certain physical symptoms and risks.  Hormone replacement therapy may reduce some of these symptoms and risks. Talk to your health care provider about whether hormone replacement therapy is right for you. Follow these instructions at home:  Schedule regular health, dental, and eye exams.  Stay current with your immunizations.  Do not use any tobacco products including cigarettes, chewing tobacco, or electronic cigarettes.  If you are pregnant, do not drink alcohol.  If you are breastfeeding, limit how much and how often you drink alcohol.  Limit alcohol intake to no more than 1 drink per day for nonpregnant women. One drink equals 12 ounces of beer, 5 ounces of wine, or 1 ounces of hard liquor.  Do not use street drugs.  Do not share needles.  Ask your health care provider for help if you need support or information about quitting drugs.  Tell your health care provider if you often feel depressed.  Tell your health care provider if you have ever been abused or do not feel safe at home. This information is not intended to replace advice given to you by your health care provider. Make sure you discuss any questions you have with your health care provider. Document Released: 07/16/2010 Document Revised: 06/08/2015 Document Reviewed: 10/04/2014 [employer] Interactive Patient Education  2019 [employer]  

## 2018-01-22 NOTE — Progress Notes (Signed)
Kayla [name] 73 y.o.   Chief Complaint  Patient presents with  . Diabetes    follow up - Dr Brigitte Pulse wanted patient to have office visit, also additional health issues with patient   Lab Results  Component Value Date   HGBA1C 5.1 12/26/2017    HISTORY OF PRESENT ILLNESS: This is a 73 y.o. female with extensive and complicated chronic medical history seen last by Dr. Brigitte Pulse on 12/29/2017 and asked to return in 3 weeks for blood work and reevaluation.  Patient has scheduled endocrinology evaluation at [facility] next month.  Also lined up to see nutritionist.  No changes in her medical condition over the past 3 weeks.  No new complaints or medical concerns today.  HPI   Prior to Admission medications   Medication Sig Start Date End Date Taking? Authorizing Provider  acarbose (PRECOSE) 25 MG tablet TAKE 1 TABLET BY MOUTH 3 TIMES DAILY WITH MEALS. 01/20/18  Yes Renato Shin, MD  aspirin-acetaminophen-caffeine [facility] MIGRAINE) [phone] MG tablet Take 2 tablets by mouth 2 (two) times daily.   Yes [provider]  celecoxib (CELEBREX) 200 MG capsule Take 200 mg by mouth 2 (two) times daily.   Yes [provider]  clonazePAM (KLONOPIN) 1 MG tablet Take 0.5-1 tablets (0.5-1 mg total) by mouth 2 (two) times daily as needed for anxiety. 03/29/17  Yes Shawnee Knapp, MD  Continuous Blood Gluc Sensor (FREESTYLE LIBRE 14 DAY SENSOR) MISC 1 Units by Does not apply route every 14 (fourteen) days. 06/26/17  Yes Shawnee Knapp, MD  cyclobenzaprine (FLEXERIL) 10 MG tablet Take 1 tablet (10 mg total) by mouth 3 (three) times daily as needed for muscle spasms. 03/29/17  Yes Shawnee Knapp, MD  cycloSPORINE (RESTASIS) 0.05 % ophthalmic emulsion Place 1 drop into both eyes 2 (two) times daily. 11/08/16  Yes Shawnee Knapp, MD  diclofenac sodium (VOLTAREN) 1 % GEL Apply 2 g topically 4 (four) times daily. 02/12/17  Yes Shawnee Knapp, MD  dicyclomine (BENTYL) 10 MG capsule Take 1-2 tab 3 times daily AC as needed  for spasms and cramping. 11/08/16  Yes Shawnee Knapp, MD  diltiazem 2 % GEL Apply 1 application topically 2 (two) times daily as needed (pain from fissure). 10/05/16  Yes Shawnee Knapp, MD  docusate sodium (COLACE) 250 MG capsule Take 1 capsule (250 mg total) by mouth daily. 11/08/16  Yes Shawnee Knapp, MD  DULoxetine (CYMBALTA) 30 MG capsule Take 3 capsules (90 mg total) by mouth daily. 10/22/17  Yes Jaffe, Adam R, DO  Erenumab-aooe (AIMOVIG) 70 MG/ML SOAJ Inject 70 mg into the skin every 30 (thirty) days. 08/13/17  Yes Tomi Likens, Adam R, DO  esomeprazole (NEXIUM) 40 MG capsule Take 1 capsule (40 mg total) by mouth daily before breakfast. 11/08/16  Yes Shawnee Knapp, MD  famotidine (PEPCID) 40 MG tablet Take 1 tablet (40 mg total) by mouth at bedtime. 12/01/17  Yes Gatha Mayer, MD  fentaNYL (DURAGESIC - DOSED MCG/HR) 50 MCG/HR Place 50 mcg onto the skin every 3 (three) days.   Yes [provider]  furosemide (LASIX) 40 MG tablet Take 1 tablet (40 mg total) by mouth daily as needed for edema. 03/29/17  Yes Shawnee Knapp, MD  HYDROcodone-acetaminophen (NORCO) 7.5-325 MG tablet Take 1 tablet every 6 (six) hours as needed by mouth for moderate pain.   Yes [provider]  hypromellose (SYSTANE OVERNIGHT THERAPY) 0.3 % GEL ophthalmic ointment Place 1 drop into  both eyes at bedtime. Reported on 02/11/2015   Yes [provider]  IRON PO Take by mouth.   Yes [provider]  L-Methylfolate-Algae-B12-B6 (METANX PO) Take 2 mg by mouth every 12 (twelve) hours.   Yes [provider]  lidocaine (LIDODERM) 5 % Place 1 patch onto the skin as needed. Remove & Discard patch within 12 hours. May dispense as 3 month supply 11/08/16  Yes Shawnee Knapp, MD  Lifitegrast [place]) 5 % SOLN Place 1 drop into both eyes daily.    Yes [provider]  lubiprostone (AMITIZA) 24 MCG capsule Take 1 capsule (24 mcg total) by mouth 2 (two) times daily with a meal. 09/01/17  Yes Gatha Mayer, MD   meloxicam (MOBIC) 15 MG tablet Take 1 tablet (15 mg total) by mouth daily. Prn pain 07/11/17  Yes Shawnee Knapp, MD  metroNIDAZOLE (METROGEL) 1 % gel Apply topically daily. 03/19/17  Yes Shawnee Knapp, MD  mirabegron ER (MYRBETRIQ) 25 MG TB24 tablet Take 1 tablet (25 mg total) by mouth daily. Office visit needed 10/07/17  Yes Shawnee Knapp, MD  mupirocin ointment (BACTROBAN) 2 % Apply 1 application topically 3 (three) times daily. 10/05/16  Yes Shawnee Knapp, MD  naloxegol oxalate (MOVANTIK) 25 MG TABS tablet Take 25 mg by mouth daily.   Yes [provider]  Naloxone HCl 0.4 MG/0.4ML SOAJ Administer 0.'4mg'$  [place] at first sign of opioid overdose and repeat every 2 minutes as needed for resuscitation. Call 911 immediately 03/20/16  Yes Shawnee Knapp, MD  Nerve Stimulator (CEFALY KIT) DEVI 20 Minutes by Does not apply route as directed. 04/11/17  Yes Pieter Partridge, DO  NON FORMULARY [facility]  Peripheral Neuropathy Cream- Bupivacaine 1%, Doxepin 3%, Gabapentin 6%, Pentoxifylline 3%, Topiramate 1% Apply 1-2 grams to affected area 3-4 times daily Qty. 120 gm 3 refills   Yes [provider]  ondansetron (ZOFRAN) 8 MG tablet Take 1 tablet (8 mg total) by mouth every 8 (eight) hours as needed for nausea or vomiting. 03/25/17  Yes Shawnee Knapp, MD  ONE TOUCH LANCETS MISC Use to check cbgs 6 times daily as directed by physician. Testing freq: hypoglycemia, hyperglycemia. ICD10: R73.03, E 16.2 11/08/16  Yes Shawnee Knapp, MD  potassium chloride SA (K-DUR,KLOR-CON) 20 MEQ tablet Take 1 tablet (20 mEq total) by mouth daily as needed (only take along with the furosemide). 03/29/17  Yes Shawnee Knapp, MD  pregabalin (LYRICA) 50 MG capsule Take 1 capsule (50 mg total) by mouth 3 (three) times daily. Patient taking differently: Take 50 mg by mouth 2 (two) times daily.  12/26/16  Yes Jaffe, Adam R, DO  Probiotic Product (PROBIOTIC-10 PO) Take by mouth.    Yes [provider]  SUMAtriptan (IMITREX) 100 MG  tablet Take 1 tablet at earliest onset of headache, may repeat in 2 hours if headache persists or reoccurs. Patient taking differently: Take 100 mg by mouth every 3 (three) days.  02/26/16  Yes Shawnee Knapp, MD  tizanidine (ZANAFLEX) 2 MG capsule TAKE 1 CAPSULE (2 MG TOTAL) BY MOUTH 3 (THREE) TIMES DAILY. 10/10/17  Yes [provider]  traZODone (DESYREL) 100 MG tablet Take 0.5-1 tablets (50-100 mg total) by mouth at bedtime as needed for sleep. And 1/2 -1 if wakes at 4am. 11/08/16  Yes Shawnee Knapp, MD  valACYclovir (VALTREX) 1000 MG tablet Take 1 tablet (1,000 mg total) by mouth 3 (three) times daily. 03/19/17  Yes  Shawnee Knapp, MD  buPROPion [facility]) 75 MG tablet Take 75 mg by mouth daily after breakfast.   04/10/11  [provider]    Allergies  Allergen Reactions  . Ambien [Zolpidem Tartrate]     Hallucinations  . Codeine Anaphylaxis  . Oxycodone-Acetaminophen Shortness Of Breath  . Tylox [Oxycodone-Acetaminophen] Anaphylaxis    Chest pain  . Darvocet [Propoxyphene N-Acetaminophen]     Chest pain  . Darvon [Propoxyphene] Itching  . Lorcet [Hydrocodone-Acetaminophen]     Chest pain  . Opium     hallucinations  . Vicodin [Hydrocodone-Acetaminophen] Other (See Comments)    Chest Pain   . Wheat Bran   . Amoxicillin Nausea And Vomiting    Reaction:  Migraine headache  . Penicillins Nausea And Vomiting    Migraine Headaches    Patient Active Problem List   Diagnosis Date Noted  . MVP (mitral valve prolapse) 12/17/2017  . Rosacea 11/02/2017  . Chronic pain syndrome 11/02/2017  . Small intestinal bacterial overgrowth 08/05/2017  . Orthostatic hypotension 11/20/2016  . Diabetes ([place]) 11/18/2016  . Hypoglycemia 11/08/2016  . Cervical facet joint syndrome 10/08/2016  . Anal fissure 10/08/2016  . Peripheral vascular disease ([place]) 01/22/2016  . Hypertension 01/22/2016  . Prediabetes 01/22/2016  . H/O hyperparathyroidism 01/22/2016  . Mitral regurgitation  12/11/2015  . Vitamin D deficiency 03/08/2015  . H/O gastric bypass 02/11/2014  . Chronic maxillary sinusitis 09/15/2013  . Personal history of colonic polyps 07/22/2013  . HA (headache)-chronic/tension type 07/28/2012  . Osteoporosis 07/28/2012  . Unspecified vitamin D deficiency-2009 07/28/2012  . Nephrolithiasis 07/28/2012  . Fibromyalgia 07/26/2012  . Chronic interstitial cystitis 10/16/2011  . Chronic constipation 10/04/2010  . WEIGHT LOSS 11/27/2009  . PERSONAL HISTORY OF FAILED MODERATE SEDATION 06/01/2008  . Anxiety state 03/18/2007  . Reactive depression 03/18/2007  . GERD 03/18/2007  . Diaphragmatic hernia 03/18/2007  . Osteoarthritis 03/18/2007  . [place] DISEASE, CERVICAL 03/18/2007  . [employer] DISEASE, LUMBAR 03/18/2007  . Myalgia and myositis 03/18/2007    Past Medical History:  Diagnosis Date  . Allergy   . Anal fissure 10/08/2016  . Anemia    anemia in the past  . Angina    takes Propanolol prn and it stops her chest  . Anxiety   . Barrett esophagus    not consistently present  . Cataract   . Chronic kidney disease    kidney stones and UTI  . Complication of anesthesia    either  BP or pulse dropped with last surgery  . DDD (degenerative disc disease)    cervical and lumbar  . Dementia ([place])   . Depression    post traumatic stress  disorder  . Diabetes ([place]) 11/18/2016  . Diabetes mellitus    sugar goes up and down diet controlled  . Dysrhythmia    brought on by stress  . External hemorrhoids   . Fatigue   . Fibromyalgia    neuropathy knees ankles and toes, bladder  . GERD (gastroesophageal reflux disease)   . H/O hyperparathyroidism   . Headache(784.0)    migraines  . Hiatal hernia   . History of kidney stones   . Hypertension    3 small brain aneurysms  . Hypoglycemia 11/08/2016  . IBS (irritable bowel syndrome)   . Internal hemorrhoids   . Macular degeneration   . Migraines   . Mitral regurgitation 12/11/2015  . Osteoarthritis    all over   . Osteopetrosis   . Peripheral vascular disease ([place])  superficial phlebitis in left calf  . Personal history of colonic polyps 07/22/2013   07/2013 - 3 small adenomas - repeat colonoscopy 2018  . Small intestinal bacterial overgrowth 08/05/2017   + lactulose breath test 06/2017 Xifaxan Rx    Past Surgical History:  Procedure Laterality Date  . ABDOMINAL HYSTERECTOMY    . APPENDECTOMY    . CARDIAC CATHETERIZATION  03/22/1991   EF 61%  . CARDIOVASCULAR STRESS TEST  10/03/2006  . CHOLECYSTECTOMY    . COLONOSCOPY  06/23/2008   normal  . DILATION AND CURETTAGE OF UTERUS     x2  . ESOPHAGUS SURGERY    . EXPLORATORY LAPAROTOMY  1993  . EYE SURGERY    . GASTRIC BYPASS  1999  . GASTRIC RESTRICTION SURGERY  1991  . LASIK Bilateral   . Right Arm Surgery    . Right Knee Arthroscopy    . Rt wrist fx  2009  . stomach stappeling  1991  . STONE EXTRACTION WITH BASKET  2012  . TONSILLECTOMY    . TUBAL LIGATION    . UPPER GASTROINTESTINAL ENDOSCOPY  06/23/2008   w/Dil, Barrett's esophagus  . US ECHOCARDIOGRAPHY  01/21/2007   EF 55-60%  . US ECHOCARDIOGRAPHY  08/30/2003   EF 55-60%    Social History   Socioeconomic History  . Marital status: Married    Spouse name: Rushie Chestnut.  . Number of children: 2  . Years of education: Not on file  . Highest education level: Not on file  Occupational History  . Occupation: Disability    Employer: RETIRED  Social Needs  . Financial resource strain: Not on file  . Food insecurity:    Worry: Not on file    Inability: Not on file  . Transportation needs:    Medical: Not on file    Non-medical: Not on file  Tobacco Use  . Smoking status: Never Smoker  . Smokeless tobacco: Never Used  Substance and Sexual Activity  . Alcohol use: No  . Drug use: No  . Sexual activity: Not Currently  Lifestyle  . Physical activity:    Days per week: Not on file    Minutes per session: Not on file  . Stress: Not on file  Relationships  . Social connections:     Talks on phone: Not on file    Gets together: Not on file    Attends religious service: Not on file    Active member of club or organization: Not on file    Attends meetings of clubs or organizations: Not on file    Relationship status: Not on file  . Intimate partner violence:    Fear of current or ex partner: Not on file    Emotionally abused: Not on file    Physically abused: Not on file    Forced sexual activity: Not on file  Other Topics Concern  . Not on file  Social History Narrative   She retired on disability   Married   2 Children    Family History  Problem Relation Age of Onset  . Stroke Mother   . Lung cancer Mother   . Heart disease Mother   . Diabetes Mother   . Hypertension Father   . Stroke Father   . Lung cancer Father   . Heart disease Father   . Kidney disease Father   . Seizures Father        epilepsy, and sisters x 2  . Pancreatic cancer Sister   .  Lung cancer Sister   . Colon cancer Maternal Aunt        12 relatives  . Uterine cancer Other        aunt  . Heart disease Other        grandmother  . Clotting disorder Sister   . Heart disease Sister        x 2  . Kidney disease Sister        x 2  . Breast cancer Sister   . Colon cancer Paternal Aunt   . Colon cancer Paternal Aunt      Review of Systems  Constitutional: Negative.  Negative for chills and fever.  Respiratory: Negative for cough and shortness of breath.   Cardiovascular: Negative for chest pain and palpitations.  Gastrointestinal: Negative for nausea and vomiting.  Genitourinary: Negative for dysuria.  Skin: Negative.  Negative for rash.  Neurological: Negative for dizziness and headaches.    Vitals:   01/22/18 1613  BP: (!) 113/58  Pulse: 89  Resp: 16  Temp: 98.8 F (37.1 C)  SpO2: 97%    Physical Exam Vitals signs reviewed.  Constitutional:      Appearance: Normal appearance.  HENT:     Head: Normocephalic.     Mouth/Throat:     Mouth: Mucous membranes  are moist.     Pharynx: Oropharynx is clear.  Eyes:     Extraocular Movements: Extraocular movements intact.     Conjunctiva/sclera: Conjunctivae normal.     Pupils: Pupils are equal, round, and reactive to light.  Neck:     Musculoskeletal: Normal range of motion.  Cardiovascular:     Rate and Rhythm: Normal rate and regular rhythm.     Heart sounds: Normal heart sounds.  Pulmonary:     Effort: Pulmonary effort is normal.     Breath sounds: Normal breath sounds.  Abdominal:     General: There is no distension.     Palpations: Abdomen is soft.     Tenderness: There is no abdominal tenderness.  Musculoskeletal: Normal range of motion.  Skin:    General: Skin is warm and dry.  Neurological:     General: No focal deficit present.     Mental Status: She is alert and oriented to person, place, and time.  Psychiatric:        Mood and Affect: Mood normal.        Behavior: Behavior normal.      ASSESSMENT & PLAN:  Chakara was seen today for diabetes.  Diagnoses and all orders for this visit:  Type 2 diabetes mellitus with hypoglycemia without coma, without long-term current use of insulin (HCC) -     CBC with Differential/Platelet -     Comprehensive metabolic panel -     Hemoglobin A1c  Weight loss, unintentional  Chronic pain syndrome   Patient Instructions       If you have lab work done today you will be contacted with your lab results within the next 2 weeks.  If you have not heard from [place] then please contact us. The fastest way to get your results is to register for My Chart.   IF you received an x-ray today, you will receive an invoice from [facility]. Please contact [facility] at [phone] with questions or concerns regarding your invoice.   IF you received labwork today, you will receive an invoice from [place]. Please contact [facility] at [phone] with questions or concerns regarding your invoice.   Our  billing staff will not be  able to assist you with questions regarding bills from these companies.  You will be contacted with the lab results as soon as they are available. The fastest way to get your results is to activate your My Chart account. Instructions are located on the last page of this paperwork. If you have not heard from [place] regarding the results in 2 weeks, please contact this office.    Health Maintenance, Female Adopting a healthy lifestyle and getting preventive care can go a long way to promote health and wellness. Talk with your health care provider about what schedule of regular examinations is right for you. This is a good chance for you to check in with your provider about disease prevention and staying healthy. In between checkups, there are plenty of things you can do on your own. Experts have done a lot of research about which lifestyle changes and preventive measures are most likely to keep you healthy. Ask your health care provider for more information. Weight and diet Eat a healthy diet  Be sure to include plenty of vegetables, fruits, low-fat dairy products, and lean protein.  Do not eat a lot of foods high in solid fats, added sugars, or salt.  Get regular exercise. This is one of the most important things you can do for your health. ? Most adults should exercise for at least 150 minutes each week. The exercise should increase your heart rate and make you sweat (moderate-intensity exercise). ? Most adults should also do strengthening exercises at least twice a week. This is in addition to the moderate-intensity exercise. Maintain a healthy weight  Body mass index (BMI) is a measurement that can be used to identify possible weight problems. It estimates body fat based on height and weight. Your health care provider can help determine your BMI and help you achieve or maintain a healthy weight.  For females 82 years of age and older: ? A BMI below 18.5 is considered underweight. ? A BMI of  18.5 to 24.9 is normal. ? A BMI of 25 to 29.9 is considered overweight. ? A BMI of 30 and above is considered obese. Watch levels of cholesterol and blood lipids  You should start having your blood tested for lipids and cholesterol at 71 years of age, then have this test every 5 years.  You may need to have your cholesterol levels checked more often if: ? Your lipid or cholesterol levels are high. ? You are older than 71 years of age. ? You are at high risk for heart disease. Cancer screening Lung Cancer  Lung cancer screening is recommended for adults 63-12 years old who are at high risk for lung cancer because of a history of smoking.  A yearly low-dose CT scan of the lungs is recommended for people who: ? Currently smoke. ? Have quit within the past 15 years. ? Have at least a 30-pack-year history of smoking. A pack year is smoking an average of one pack of cigarettes a day for 1 year.  Yearly screening should continue until it has been 15 years since you quit.  Yearly screening should stop if you develop a health problem that would prevent you from having lung cancer treatment. Breast Cancer  Practice breast self-awareness. This means understanding how your breasts normally appear and feel.  It also means doing regular breast self-exams. Let your health care provider know about any changes, no matter how small.  If you are in your 35s  or 59s, you should have a clinical breast exam (CBE) by a health care provider every 1-3 years as part of a regular health exam.  If you are 87 or older, have a CBE every year. Also consider having a breast X-ray (mammogram) every year.  If you have a family history of breast cancer, talk to your health care provider about genetic screening.  If you are at high risk for breast cancer, talk to your health care provider about having an MRI and a mammogram every year.  Breast cancer gene (BRCA) assessment is recommended for women who have family  members with BRCA-related cancers. BRCA-related cancers include: ? Breast. ? Ovarian. ? Tubal. ? Peritoneal cancers.  Results of the assessment will determine the need for genetic counseling and BRCA1 and BRCA2 testing. Cervical Cancer Your health care provider may recommend that you be screened regularly for cancer of the pelvic organs (ovaries, uterus, and vagina). This screening involves a pelvic examination, including checking for microscopic changes to the surface of your cervix (Pap test). You may be encouraged to have this screening done every 3 years, beginning at age 40.  For women ages 21-65, health care providers may recommend pelvic exams and Pap testing every 3 years, or they may recommend the Pap and pelvic exam, combined with testing for human papilloma virus (HPV), every 5 years. Some types of HPV increase your risk of cervical cancer. Testing for HPV may also be done on women of any age with unclear Pap test results.  Other health care providers may not recommend any screening for nonpregnant women who are considered low risk for pelvic cancer and who do not have symptoms. Ask your health care provider if a screening pelvic exam is right for you.  If you have had past treatment for cervical cancer or a condition that could lead to cancer, you need Pap tests and screening for cancer for at least 20 years after your treatment. If Pap tests have been discontinued, your risk factors (such as having a new sexual partner) need to be reassessed to determine if screening should resume. Some women have medical problems that increase the chance of getting cervical cancer. In these cases, your health care provider may recommend more frequent screening and Pap tests. Colorectal Cancer  This type of cancer can be detected and often prevented.  Routine colorectal cancer screening usually begins at 71 years of age and continues through 71 years of age.  Your health care provider may recommend  screening at an earlier age if you have risk factors for colon cancer.  Your health care provider may also recommend using home test kits to check for hidden blood in the stool.  A small camera at the end of a tube can be used to examine your colon directly (sigmoidoscopy or colonoscopy). This is done to check for the earliest forms of colorectal cancer.  Routine screening usually begins at age 21.  Direct examination of the colon should be repeated every 5-10 years through 71 years of age. However, you may need to be screened more often if early forms of precancerous polyps or small growths are found. Skin Cancer  Check your skin from head to toe regularly.  Tell your health care provider about any new moles or changes in moles, especially if there is a change in a mole's shape or color.  Also tell your health care provider if you have a mole that is larger than the size of a pencil eraser.  Always use sunscreen. Apply sunscreen liberally and repeatedly throughout the day.  Protect yourself by wearing long sleeves, pants, a wide-brimmed hat, and sunglasses whenever you are outside. Heart disease, diabetes, and high blood pressure  High blood pressure causes heart disease and increases the risk of stroke. High blood pressure is more likely to develop in: ? People who have blood pressure in the high end of the normal range (130-139/85-89 mm Hg). ? People who are overweight or obese. ? People who are African American.  If you are 40-74 years of age, have your blood pressure checked every 3-5 years. If you are 54 years of age or older, have your blood pressure checked every year. You should have your blood pressure measured twice-once when you are at a hospital or clinic, and once when you are not at a hospital or clinic. Record the average of the two measurements. To check your blood pressure when you are not at a hospital or clinic, you can use: ? An automated blood pressure machine at a  pharmacy. ? A home blood pressure monitor.  If you are between 65 years and 29 years old, ask your health care provider if you should take aspirin to prevent strokes.  Have regular diabetes screenings. This involves taking a blood sample to check your fasting blood sugar level. ? If you are at a normal weight and have a low risk for diabetes, have this test once every three years after 71 years of age. ? If you are overweight and have a high risk for diabetes, consider being tested at a younger age or more often. Preventing infection Hepatitis B  If you have a higher risk for hepatitis B, you should be screened for this virus. You are considered at high risk for hepatitis B if: ? You were born in a country where hepatitis B is common. Ask your health care provider which countries are considered high risk. ? Your parents were born in a high-risk country, and you have not been immunized against hepatitis B (hepatitis B vaccine). ? You have HIV or AIDS. ? You use needles to inject street drugs. ? You live with someone who has hepatitis B. ? You have had sex with someone who has hepatitis B. ? You get hemodialysis treatment. ? You take certain medicines for conditions, including cancer, organ transplantation, and autoimmune conditions. Hepatitis C  Blood testing is recommended for: ? Everyone born from [birth year] through 1963. ? Anyone with known risk factors for hepatitis C. Sexually transmitted infections (STIs)  You should be screened for sexually transmitted infections (STIs) including gonorrhea and chlamydia if: ? You are sexually active and are younger than 71 years of age. ? You are older than 71 years of age and your health care provider tells you that you are at risk for this type of infection. ? Your sexual activity has changed since you were last screened and you are at an increased risk for chlamydia or gonorrhea. Ask your health care provider if you are at risk.  If you do not have  HIV, but are at risk, it may be recommended that you take a prescription medicine daily to prevent HIV infection. This is called pre-exposure prophylaxis (PrEP). You are considered at risk if: ? You are sexually active and do not regularly use condoms or know the HIV status of your partner(s). ? You take drugs by injection. ? You are sexually active with a partner who has HIV. Talk with your health care provider  about whether you are at high risk of being infected with HIV. If you choose to begin PrEP, you should first be tested for HIV. You should then be tested every 3 months for as long as you are taking PrEP. Pregnancy  If you are premenopausal and you may become pregnant, ask your health care provider about preconception counseling.  If you may become pregnant, take 400 to 800 micrograms (mcg) of folic acid every day.  If you want to prevent pregnancy, talk to your health care provider about birth control (contraception). Osteoporosis and menopause  Osteoporosis is a disease in which the bones lose minerals and strength with aging. This can result in serious bone fractures. Your risk for osteoporosis can be identified using a bone density scan.  If you are 65 years of age or older, or if you are at risk for osteoporosis and fractures, ask your health care provider if you should be screened.  Ask your health care provider whether you should take a calcium or vitamin D supplement to lower your risk for osteoporosis.  Menopause may have certain physical symptoms and risks.  Hormone replacement therapy may reduce some of these symptoms and risks. Talk to your health care provider about whether hormone replacement therapy is right for you. Follow these instructions at home:  Schedule regular health, dental, and eye exams.  Stay current with your immunizations.  Do not use any tobacco products including cigarettes, chewing tobacco, or electronic cigarettes.  If you are pregnant, do not  drink alcohol.  If you are breastfeeding, limit how much and how often you drink alcohol.  Limit alcohol intake to no more than 1 drink per day for nonpregnant women. One drink equals 12 ounces of beer, 5 ounces of wine, or 1 ounces of hard liquor.  Do not use street drugs.  Do not share needles.  Ask your health care provider for help if you need support or information about quitting drugs.  Tell your health care provider if you often feel depressed.  Tell your health care provider if you have ever been abused or do not feel safe at home. This information is not intended to replace advice given to you by your health care provider. Make sure you discuss any questions you have with your health care provider. Document Released: 07/16/2010 Document Revised: 06/08/2015 Document Reviewed: 10/04/2014 Elsevier Interactive Patient Education  2019 Elsevier Inc.     Agustina Caroli, MD [facility]

## 2018-01-23 DIAGNOSIS — M792 Neuralgia and neuritis, unspecified: Secondary | ICD-10-CM | POA: Insufficient documentation

## 2018-01-23 DIAGNOSIS — M797 Fibromyalgia: Secondary | ICD-10-CM | POA: Diagnosis not present

## 2018-01-23 DIAGNOSIS — M47812 Spondylosis without myelopathy or radiculopathy, cervical region: Secondary | ICD-10-CM | POA: Diagnosis not present

## 2018-01-23 DIAGNOSIS — G629 Polyneuropathy, unspecified: Secondary | ICD-10-CM | POA: Diagnosis not present

## 2018-01-23 HISTORY — DX: Neuralgia and neuritis, unspecified: M79.2

## 2018-01-23 LAB — CBC WITH DIFFERENTIAL/PLATELET
Basophils Absolute: 0 10*3/uL (ref 0.0–0.2)
Basos: 1 %
EOS (ABSOLUTE): 0.2 10*3/uL (ref 0.0–0.4)
EOS: 4 %
Hematocrit: 34.7 % (ref 34.0–46.6)
Hemoglobin: 11.6 g/dL (ref 11.1–15.9)
IMMATURE GRANS (ABS): 0 10*3/uL (ref 0.0–0.1)
Immature Granulocytes: 0 %
Lymphocytes Absolute: 1.6 10*3/uL (ref 0.7–3.1)
Lymphs: 25 %
MCH: 30.5 pg (ref 26.6–33.0)
MCHC: 33.4 g/dL (ref 31.5–35.7)
MCV: 91 fL (ref 79–97)
Monocytes Absolute: 0.4 10*3/uL (ref 0.1–0.9)
Monocytes: 7 %
Neutrophils Absolute: 4.3 10*3/uL (ref 1.4–7.0)
Neutrophils: 63 %
Platelets: 262 10*3/uL (ref 150–450)
RBC: 3.8 x10E6/uL (ref 3.77–5.28)
RDW: 12.6 % (ref 11.7–15.4)
WBC: 6.6 10*3/uL (ref 3.4–10.8)

## 2018-01-23 LAB — HEMOGLOBIN A1C
Est. average glucose Bld gHb Est-mCnc: 100 mg/dL
HEMOGLOBIN A1C: 5.1 % (ref 4.8–5.6)

## 2018-01-23 LAB — COMPREHENSIVE METABOLIC PANEL
ALBUMIN: 4.1 g/dL (ref 3.5–4.8)
ALT: 23 IU/L (ref 0–32)
AST: 31 IU/L (ref 0–40)
Albumin/Globulin Ratio: 2.3 — ABNORMAL HIGH (ref 1.2–2.2)
Alkaline Phosphatase: 77 IU/L (ref 39–117)
BUN / CREAT RATIO: 39 — AB (ref 12–28)
BUN: 22 mg/dL (ref 8–27)
Bilirubin Total: 0.4 mg/dL (ref 0.0–1.2)
CALCIUM: 9.2 mg/dL (ref 8.7–10.3)
CHLORIDE: 103 mmol/L (ref 96–106)
CO2: 26 mmol/L (ref 20–29)
Creatinine, Ser: 0.57 mg/dL (ref 0.57–1.00)
GFR calc non Af Amer: 94 mL/min/{1.73_m2} (ref >59)
GFR, EST AFRICAN AMERICAN: 109 mL/min/{1.73_m2} (ref >59)
Globulin, Total: 1.8 g/dL (ref 1.5–4.5)
Glucose: 81 mg/dL (ref 65–99)
Potassium: 4.4 mmol/L (ref 3.5–5.2)
Sodium: 142 mmol/L (ref 134–144)
Total Protein: 5.9 g/dL — ABNORMAL LOW (ref 6.0–8.5)

## 2018-01-23 NOTE — Telephone Encounter (Signed)
Wheelchair forms has been completed and faxed also sent with pt at her last OV. Will have to send provider a message to follow-up

## 2018-01-26 ENCOUNTER — Ambulatory Visit: Payer: Medicare Other | Attending: Family Medicine | Admitting: Physical Therapy

## 2018-01-26 ENCOUNTER — Other Ambulatory Visit: Payer: Self-pay

## 2018-01-26 DIAGNOSIS — R2681 Unsteadiness on feet: Secondary | ICD-10-CM | POA: Diagnosis not present

## 2018-01-26 DIAGNOSIS — M6281 Muscle weakness (generalized): Secondary | ICD-10-CM | POA: Diagnosis not present

## 2018-01-26 DIAGNOSIS — R2689 Other abnormalities of gait and mobility: Secondary | ICD-10-CM

## 2018-01-26 NOTE — Patient Instructions (Signed)
  Aquatic Therapy: What to Expect!  Where:  [facility] [address] [place], [place]  [address] [phone]  NOTE:  Dennis Bast will receive an automated phone message reminding you of your appointment and it will say the appointment is at the [facility] on [address]  We are working to fix this- just know that you will meet [place] at the pool!  How to Prepare: . Please make sure you drink 8 ounces of water about one hour prior to your pool session . A caregiver MUST attend the entire session with the patient.  The caregiver will be responsible for assisting with dressing as well as any toileting needs.  If the patient will be doing a home program this should likely be the person who will assist as well.  . Patients must wear either their street shoes or pool shoes until they are ready to enter the pool with the therapist.  Patients must also wear either street shoes or pool shoes once exiting the pool to walk to the locker room.  This will helps [place] prevent slips and falls.  . Please arrive 15 minutes early to prepare for your pool therapy session . Sign in at the front desk on the clipboard marked for [place] . You may use the locker rooms on your right and then enter directly into the recreation pool (NOT the competition pool) . Please make sure to attend to any toileting needs prior to entering the pool . Please be dressed in your swim suit and on the pool deck at least 5 minutes before your appointment . Once on the pool deck your therapist will ask you to sign the Patient  Consent and Assignment of Benefits form . Your therapist may take your blood pressure prior to, during and after your session if indicated  About the pool: 1. Entering the pool Your therapist will assist you; there are multiple ways to enter including stairs with railings, a walk in ramp, a roll in chair and a mechanical lift. Your therapist will determine the most appropriate way for you. 2. Water  temperature is usually between 86-87 degrees 3. There may be other swimmers in the pool at the same time   Contact Info:     Appointments: [facility]  All sessions are 45 minutes   [place] [address]     Please call the [facility] if   [place],    [id number]    you need to cancel or reschedule an appointment.  [phone]

## 2018-01-27 ENCOUNTER — Ambulatory Visit (INDEPENDENT_AMBULATORY_CARE_PROVIDER_SITE_OTHER): Payer: Medicare Other | Admitting: Psychiatry

## 2018-01-27 ENCOUNTER — Encounter: Payer: Self-pay | Admitting: Physical Therapy

## 2018-01-27 DIAGNOSIS — F331 Major depressive disorder, recurrent, moderate: Secondary | ICD-10-CM | POA: Diagnosis not present

## 2018-01-27 NOTE — Therapy (Signed)
[facility] [address] [place], [place], [address] Phone: [phone]   Fax:  [phone]  Physical Therapy Evaluation  Patient Details  Name: Kayla [name] MRN: [id number] Date of Birth: 09-06-1945 Referring Provider (PT): [name], MD   Encounter Date: 01/26/2018  PT End of Session - 01/27/18 2036    Visit Number  1    Number of Visits  17    Date for PT Re-Evaluation  03/29/18    Authorization Type  Medicare and [facility]    Authorization Time Period  01-26-18 - 04-26-18    PT Start Time  0804    PT Stop Time  0846    PT Time Calculation (min)  42 min       Past Medical History:  Diagnosis Date  . Allergy   . Anal fissure 10/08/2016  . Anemia    anemia in the past  . Angina    takes Propanolol prn and it stops her chest  . Anxiety   . Barrett esophagus    not consistently present  . Cataract   . Chronic kidney disease    kidney stones and UTI  . Complication of anesthesia    either  BP or pulse dropped with last surgery  . DDD (degenerative disc disease)    cervical and lumbar  . Dementia ([place])   . Depression    post traumatic stress  disorder  . Diabetes ([place]) 11/18/2016  . Diabetes mellitus    sugar goes up and down diet controlled  . Dysrhythmia    brought on by stress  . External hemorrhoids   . Fatigue   . Fibromyalgia    neuropathy knees ankles and toes, bladder  . GERD (gastroesophageal reflux disease)   . H/O hyperparathyroidism   . Headache(784.0)    migraines  . Hiatal hernia   . History of kidney stones   . Hypertension    3 small brain aneurysms  . Hypoglycemia 11/08/2016  . IBS (irritable bowel syndrome)   . Internal hemorrhoids   . Macular degeneration   . Migraines   . Mitral regurgitation 12/11/2015  . Osteoarthritis    all over  . Osteopetrosis   . Peripheral vascular disease ([facility])    superficial phlebitis in left calf  . Personal history of colonic polyps  07/22/2013   07/2013 - 3 small adenomas - repeat colonoscopy 2018  . Small intestinal bacterial overgrowth 08/05/2017   + lactulose breath test 06/2017 Xifaxan Rx    Past Surgical History:  Procedure Laterality Date  . ABDOMINAL HYSTERECTOMY    . APPENDECTOMY    . CARDIAC CATHETERIZATION  03/22/1991   EF 61%  . CARDIOVASCULAR STRESS TEST  10/03/2006  . CHOLECYSTECTOMY    . COLONOSCOPY  06/23/2008   normal  . DILATION AND CURETTAGE OF UTERUS     x2  . ESOPHAGUS SURGERY    . EXPLORATORY LAPAROTOMY  1993  . EYE SURGERY    . GASTRIC BYPASS  1999  . GASTRIC RESTRICTION SURGERY  1991  . LASIK Bilateral   . Right Arm Surgery    . Right Knee Arthroscopy    . Rt wrist fx  2009  . stomach stappeling  1991  . STONE EXTRACTION WITH BASKET  2012  . TONSILLECTOMY    . TUBAL LIGATION    . UPPER GASTROINTESTINAL ENDOSCOPY  06/23/2008   w/Dil, Barrett's esophagus  . US ECHOCARDIOGRAPHY  01/21/2007   EF 55-60%  .  US ECHOCARDIOGRAPHY  08/30/2003   EF 55-60%    There were no vitals filed for this visit.   Subjective Assessment - 01/27/18 2015    Subjective  Pt presents using a rollator - states she only uses rollator upstairs; uses furniture to hold onto as needed for assist with balance; pt reports she has much pain in her feet - does not even want to attempt standing on them for prolonged periods of time due to pain provoked:  pt states she is scheduled for test to see if spinal stimulator (for pain management)  would be beneficial at end of Jan.                                                                                                                   Pertinent History  fibromyalgia; DM:  cervical & lumbar DDD; neuropathic pain; chronic pain    Patient Stated Goals  Increase mobility and muscle tone         [facility] PT Assessment - 01/27/18 0001      Assessment   Medical Diagnosis  Gait abnormality due to polyneuropathy    Referring Provider (PT)  [name], MD    Onset Date/Surgical Date   --   approx. 2 years ago (2018)     Precautions   Precautions  Fall      Balance Screen   Has the patient fallen in the past 6 months  No    Has the patient had a decrease in activity level because of a fear of falling?   No    Is the patient reluctant to leave their home because of a fear of falling?   No      Home Environment   Living Environment  Private residence    Type of [place] to enter    Entrance Stairs-Number of Steps  2    Entrance Stairs-Rails  Right    Home Layout  Two level      Prior Function   Level of Independence  Independent with household mobility with device;Independent with community mobility with device;Needs assistance with homemaking      Ambulation/Gait   Ambulation/Gait  Yes    Ambulation/Gait Assistance  4: Min guard    Ambulation Distance (Feet)  75 Feet    Assistive device  None    Gait Pattern  Step-through pattern    Ambulation Surface  Level;Indoor    Gait velocity  32.8/16.84 = 1.95 ft/sec   pt requested to perform without use of rollator     Standardized Balance Assessment   Standardized Balance Assessment  Timed Up and Go Test      Timed Up and Go Test   Normal TUG (seconds)  18.41   no device used     tandem stance 15 secs SLS approx. 10 secs on RLE and on LLE          Objective measurements completed on examination: See above findings.  PT Education - 01/27/18 2035    Education Details  info on aquatic therapy at [facility] given to pt    Person(s) Educated  Patient    Methods  Explanation;Handout    Comprehension  Verbalized understanding       PT Short Term Goals - 01/27/18 2101      PT SHORT TERM GOAL #1   Title  Pt will improve TUG score from 18.41 secs to </= 15 secs without device to demo reduced fall risk with mobility.    Baseline  18.41 secs with no device - 01-26-18    Time  4    Period  Weeks    Status  New    Target Date  02/26/18      PT SHORT TERM GOAL  #2   Title  Improve gait velocity from 1.95 ft/sec without device to >/= 2.3 ft/sec without device for incr. gait efficiency.    Baseline  16.84 secs = 1.95 ft/sec without RW on 01-26-18    Time  4    Period  Weeks    Status  New    Target Date  02/26/18      PT SHORT TERM GOAL #3   Title  Pt will participate in aquatic therapy session for improved mobility with decreased pain.    Time  4    Period  Weeks    Status  New    Target Date  02/26/18      PT SHORT TERM GOAL #4   Title  Independent in HEP for land exercises for LE strengthening & balance exs.    Time  4    Period  Weeks    Status  New    Target Date  02/26/18        PT Long Term Goals - 01/27/18 2107      PT LONG TERM GOAL #1   Title  Pt will improve TUG score from 18.41 secs to </= 14 secs without device to demo reduced fall risk.    Time  8    Period  Weeks    Status  New    Target Date  03/27/18      PT LONG TERM GOAL #2   Title  Pt will increase gait velocity from 1.95 ft/sec without device to >/= 2.5 ft/sec without device for incr. gait efficiency.    Baseline  16.84 secs = 1.95 ft/sec on 01-26-18    Time  8    Period  Weeks    Status  New    Target Date  03/27/18      PT LONG TERM GOAL #3   Title  Independent in HEP including aquatic exercise program.    Time  8    Period  Weeks    Status  New    Target Date  03/27/18      PT LONG TERM GOAL #4   Title  Pt will subjectively report at least 50% improvement in mobility by amb. 150' without rollator without LOB.    Time  8    Period  Weeks    Status  New    Target Date  03/27/18             Plan - 01/27/18 2039    Clinical Impression Statement  Pt is a 73 yr old lady with gait abnormality due to polyneuropathy, decreased sensation in bil. feet, chronic pain syndrome, cervical and lumbar DDD and fibromyalgia.  Pt states she is  currently scheduled to undergo evaluation for candidacy for a spinal nerve stimulator for pain management.  Pt  presents with gait abnormality and decreased balance and is using a rollator for assistance with ambulation; pt requested not to use RW for gait tests during eval.  Pt is at fall risk per TUG score of 16.84 secs.  Pt will benefit from aquatic therapy to increase mobility with reduced weight bearing in a safe environment.      History and Personal Factors relevant to plan of care:  chronic pain syndrome; pt states she started using rollator approx. 4 months ago:  h/o gastric bypass - nutritional issues, GERD     Clinical Presentation  Evolving    Clinical Presentation due to:  polyneuropathy, cervical and lumbar DDD    Rehab Potential  Good    PT Frequency  2x / week    PT Duration  8 weeks    PT Treatment/Interventions  ADLs/Self Care Home Management;Aquatic Therapy;Gait training;Stair training;Therapeutic activities;Therapeutic exercise;Balance training;Neuromuscular re-education;Patient/family education    PT Next Visit Plan  land - exercises in seated position due to pt's request for non weight bearing; aquatic therapy to be scheduled    Consulted and Agree with Plan of Care  Patient       Patient will benefit from skilled therapeutic intervention in order to improve the following deficits and impairments:  Abnormal gait, Decreased balance, Decreased mobility, Pain, Decreased strength, Impaired sensation, Decreased activity tolerance  Visit Diagnosis: Other abnormalities of gait and mobility - Plan: PT plan of care cert/re-cert  Muscle weakness (generalized) - Plan: PT plan of care cert/re-cert  Unsteadiness on feet - Plan: PT plan of care cert/re-cert     Problem List Patient Active Problem List   Diagnosis Date Noted  . MVP (mitral valve prolapse) 12/17/2017  . Rosacea 11/02/2017  . Chronic pain syndrome 11/02/2017  . Small intestinal bacterial overgrowth 08/05/2017  . Orthostatic hypotension 11/20/2016  . Diabetes ([place]) 11/18/2016  . Hypoglycemia 11/08/2016  . Cervical facet  joint syndrome 10/08/2016  . Anal fissure 10/08/2016  . Peripheral vascular disease ([place]) 01/22/2016  . Hypertension 01/22/2016  . Prediabetes 01/22/2016  . H/O hyperparathyroidism 01/22/2016  . Mitral regurgitation 12/11/2015  . Vitamin D deficiency 03/08/2015  . H/O gastric bypass 02/11/2014  . Chronic maxillary sinusitis 09/15/2013  . Personal history of colonic polyps 07/22/2013  . HA (headache)-chronic/tension type 07/28/2012  . Osteoporosis 07/28/2012  . Unspecified vitamin D deficiency-2009 07/28/2012  . Nephrolithiasis 07/28/2012  . Fibromyalgia 07/26/2012  . Chronic interstitial cystitis 10/16/2011  . Chronic constipation 10/04/2010  . WEIGHT LOSS 11/27/2009  . PERSONAL HISTORY OF FAILED MODERATE SEDATION 06/01/2008  . Anxiety state 03/18/2007  . Reactive depression 03/18/2007  . GERD 03/18/2007  . Diaphragmatic hernia 03/18/2007  . Osteoarthritis 03/18/2007  . [place] DISEASE, CERVICAL 03/18/2007  . [place] DISEASE, LUMBAR 03/18/2007  . Myalgia and myositis 03/18/2007    Alda Lea, PT 01/27/2018, 9:16 PM  [facility] [address] [place], [place], [address] Phone: [phone]   Fax:  [phone]  Name: Kayla [name] MRN: [id number] Date of Birth: [birth year]

## 2018-01-28 ENCOUNTER — Ambulatory Visit: Payer: Self-pay | Admitting: Nutrition

## 2018-01-28 NOTE — Telephone Encounter (Signed)
I think pt is just calling to provide the contact info and name of a person at [facility] for my future benefit in case I ever need help getting other/future DME orders covered by ins.  I didn't order the electric wheelchair for Ms. Word - this was ordered by her neurologist Dr. Tomi Likens at her visit with him on 12/30/17. She also had a wheelchair eval by PT on 12/9 where they recommended exactly what type of electric wheelchair Ms. Rambert should have, Dr. Tomi Likens agreed with their orders.    I have not done a "mobility evaluation" on this pt - for an electric wheelchair order, Medicare has an absolute mandatory rule that the pt has to have an OV that is completely dedicated to this need for eval and order - has to have chief complaint of "mobility eval" and nothing else can be assessed or addressed at that visit.  She has NOT done this with me - so me providing an order would be useless - by the time she could get an appt w/ me to have this done, the PT wheelchair eval would be out of date (has to be done within a certain period of time from the MD visit and order) and have to be done all over again.

## 2018-02-02 ENCOUNTER — Ambulatory Visit: Payer: Medicare Other | Admitting: Physical Therapy

## 2018-02-03 ENCOUNTER — Ambulatory Visit: Payer: Medicare Other | Admitting: Psychiatry

## 2018-02-04 DIAGNOSIS — M47812 Spondylosis without myelopathy or radiculopathy, cervical region: Secondary | ICD-10-CM | POA: Diagnosis not present

## 2018-02-04 DIAGNOSIS — M4802 Spinal stenosis, cervical region: Secondary | ICD-10-CM | POA: Diagnosis not present

## 2018-02-04 DIAGNOSIS — M47814 Spondylosis without myelopathy or radiculopathy, thoracic region: Secondary | ICD-10-CM | POA: Diagnosis not present

## 2018-02-04 DIAGNOSIS — M5021 Other cervical disc displacement,  high cervical region: Secondary | ICD-10-CM | POA: Diagnosis not present

## 2018-02-04 DIAGNOSIS — M5124 Other intervertebral disc displacement, thoracic region: Secondary | ICD-10-CM | POA: Diagnosis not present

## 2018-02-04 DIAGNOSIS — M792 Neuralgia and neuritis, unspecified: Secondary | ICD-10-CM | POA: Diagnosis not present

## 2018-02-05 ENCOUNTER — Other Ambulatory Visit: Payer: Self-pay

## 2018-02-05 ENCOUNTER — Other Ambulatory Visit: Payer: Self-pay | Admitting: Endocrinology

## 2018-02-05 ENCOUNTER — Telehealth: Payer: Self-pay | Admitting: Family Medicine

## 2018-02-05 MED ORDER — FREESTYLE LIBRE 14 DAY SENSOR MISC: 1.0000 [IU] | 5 refills | Status: DC

## 2018-02-05 NOTE — Telephone Encounter (Signed)
Rx sent in

## 2018-02-05 NOTE — Telephone Encounter (Signed)
Copied from [place] [phone]. Topic: Quick Communication - See Telephone Encounter >> Feb 05, 2018  9:22 AM Vernona Rieger wrote: CRM for notification. See Telephone encounter for: 02/05/18.  Patient states she had a MRI yesterday and they made her remove her Continuous Blood Gluc Sensor (FREESTYLE LIBRE 14 DAY SENSOR) MISC, she just put it on the day before that. She states they are only good for 14 days when they are on her arm. She contacted the pharmacy to see if she could get another one but they advised they need approval from the doctor to get another one. She says she always picks up 2 boxes at a time. Call back @ [phone]  [id number] [place], [place] - [address] [place] [address]

## 2018-02-09 ENCOUNTER — Ambulatory Visit: Payer: Medicare Other | Admitting: Physical Therapy

## 2018-02-09 DIAGNOSIS — M6281 Muscle weakness (generalized): Secondary | ICD-10-CM | POA: Diagnosis not present

## 2018-02-09 DIAGNOSIS — R2689 Other abnormalities of gait and mobility: Secondary | ICD-10-CM | POA: Diagnosis not present

## 2018-02-09 DIAGNOSIS — R2681 Unsteadiness on feet: Secondary | ICD-10-CM | POA: Diagnosis not present

## 2018-02-09 NOTE — Therapy (Signed)
[facility] [address] [place], [place], [address] Phone: [phone]   Fax:  [phone]  Physical Therapy Treatment  Patient Details  Name: Kayla [name] MRN: [id number] Date of Birth: 1945-05-03 Referring Provider (PT): [name], MD   Encounter Date: 02/09/2018  PT End of Session - 02/10/18 2013    Visit Number  2    Number of Visits  17    Date for PT Re-Evaluation  03/29/18    Authorization Type  Medicare and [facility]    Authorization Time Period  01-26-18 - 04-26-18    PT Start Time  0802    PT Stop Time  0846    PT Time Calculation (min)  44 min       Past Medical History:  Diagnosis Date  . Allergy   . Anal fissure 10/08/2016  . Anemia    anemia in the past  . Angina    takes Propanolol prn and it stops her chest  . Anxiety   . Barrett esophagus    not consistently present  . Cataract   . Chronic kidney disease    kidney stones and UTI  . Complication of anesthesia    either  BP or pulse dropped with last surgery  . DDD (degenerative disc disease)    cervical and lumbar  . Dementia ([place])   . Depression    post traumatic stress  disorder  . Diabetes ([place]) 11/18/2016  . Diabetes mellitus    sugar goes up and down diet controlled  . Dysrhythmia    brought on by stress  . External hemorrhoids   . Fatigue   . Fibromyalgia    neuropathy knees ankles and toes, bladder  . GERD (gastroesophageal reflux disease)   . H/O hyperparathyroidism   . Headache(784.0)    migraines  . Hiatal hernia   . History of kidney stones   . Hypertension    3 small brain aneurysms  . Hypoglycemia 11/08/2016  . IBS (irritable bowel syndrome)   . Internal hemorrhoids   . Macular degeneration   . Migraines   . Mitral regurgitation 12/11/2015  . Osteoarthritis    all over  . Osteopetrosis   . Peripheral vascular disease (HCC)    superficial phlebitis in left calf  . Personal history of colonic polyps  07/22/2013   07/2013 - 3 small adenomas - repeat colonoscopy 2018  . Small intestinal bacterial overgrowth 08/05/2017   + lactulose breath test 06/2017 Xifaxan Rx    Past Surgical History:  Procedure Laterality Date  . ABDOMINAL HYSTERECTOMY    . APPENDECTOMY    . CARDIAC CATHETERIZATION  03/22/1991   EF 61%  . CARDIOVASCULAR STRESS TEST  10/03/2006  . CHOLECYSTECTOMY    . COLONOSCOPY  06/23/2008   normal  . DILATION AND CURETTAGE OF UTERUS     x2  . ESOPHAGUS SURGERY    . EXPLORATORY LAPAROTOMY  1993  . EYE SURGERY    . GASTRIC BYPASS  1999  . GASTRIC RESTRICTION SURGERY  1991  . LASIK Bilateral   . Right Arm Surgery    . Right Knee Arthroscopy    . Rt wrist fx  2009  . stomach stappeling  1991  . STONE EXTRACTION WITH BASKET  2012  . TONSILLECTOMY    . TUBAL LIGATION    . UPPER GASTROINTESTINAL ENDOSCOPY  06/23/2008   w/Dil, Barrett's esophagus  . US ECHOCARDIOGRAPHY  01/21/2007   EF 55-60%  .  US ECHOCARDIOGRAPHY  08/30/2003   EF 55-60%    There were no vitals filed for this visit.  Subjective Assessment - 02/10/18 2010    Subjective  Pt states she is scheduled for surgery this Friday (for implant trial of spinal stimulator for pain control);  had a bad migraine last week - is now taking Excedrin several times/day    Pertinent History  fibromyalgia; DM:  cervical & lumbar DDD; neuropathic pain; chronic pain    Patient Stated Goals  Increase mobility and muscle tone      TherEx: pt performed exercises for LE and trunk strengthening:  Bridging  X 8 reps  Bridging with abduction/adduction - 8 reps bridging with marching 8 reps   Bridging with knee ext. 8 reps SLR - RLE 10 reps with 3 sec hold;  LLE 10 reps ; 5 reps clockwise and counterclockwise 5 reps each leg Hip abdct - RLE 10 reps - 5 reps clockwise/counterclockwise 5 reps                    LLE 10 reps ; 5 reps clockwise/counterclockwise 5 reps   Hip extension 10 reps in prone position - RLE and LLE Hip extension  with knee flexed at 90 degrees - 10 reps each leg   Sitting on blue physioball for trunk control and core stabilization - pt held 2# dumbbell in each hand for UE strengthening - pt performed shoulder Flexion 10 reps each; abduction 10 reps each UE with 2# weight; both UE's together for shoulder flexion to 90 degrees                 [facility] Adult PT Treatment/Exercise - 02/10/18 0001      Exercises   Exercises  Knee/Hip      Knee/Hip Exercises: Aerobic   Nustep  level 6 x 5" with UE's and LE's               PT Short Term Goals - 01/27/18 2101      PT SHORT TERM GOAL #1   Title  Pt will improve TUG score from 18.41 secs to </= 15 secs without device to demo reduced fall risk with mobility.    Baseline  18.41 secs with no device - 01-26-18    Time  4    Period  Weeks    Status  New    Target Date  02/26/18      PT SHORT TERM GOAL #2   Title  Improve gait velocity from 1.95 ft/sec without device to >/= 2.3 ft/sec without device for incr. gait efficiency.    Baseline  16.84 secs = 1.95 ft/sec without RW on 01-26-18    Time  4    Period  Weeks    Status  New    Target Date  02/26/18      PT SHORT TERM GOAL #3   Title  Pt will participate in aquatic therapy session for improved mobility with decreased pain.    Time  4    Period  Weeks    Status  New    Target Date  02/26/18      PT SHORT TERM GOAL #4   Title  Independent in HEP for land exercises for LE strengthening & balance exs.    Time  4    Period  Weeks    Status  New    Target Date  02/26/18        PT Long Term  Goals - 01/27/18 2107      PT LONG TERM GOAL #1   Title  Pt will improve TUG score from 18.41 secs to </= 14 secs without device to demo reduced fall risk.    Time  8    Period  Weeks    Status  New    Target Date  03/27/18      PT LONG TERM GOAL #2   Title  Pt will increase gait velocity from 1.95 ft/sec without device to >/= 2.5 ft/sec without device for incr. gait efficiency.     Baseline  16.84 secs = 1.95 ft/sec on 01-26-18    Time  8    Period  Weeks    Status  New    Target Date  03/27/18      PT LONG TERM GOAL #3   Title  Independent in HEP including aquatic exercise program.    Time  8    Period  Weeks    Status  New    Target Date  03/27/18      PT LONG TERM GOAL #4   Title  Pt will subjectively report at least 50% improvement in mobility by amb. 150' without rollator without LOB.    Time  8    Period  Weeks    Status  New    Target Date  03/27/18            Plan - 02/10/18 2014    Clinical Impression Statement  Pt able to perform LE strengthening exercises against gravity with short seated rest periods due to fatigue.  No resistance used due to exercises being challenging in anti-gravity positions.  Pt tolerated exercises well.  Pt requested not to do any exercises in standing/weigth-bearing position due to pain in feet which increases with weight-bearing.      Rehab Potential  Good    PT Frequency  2x / week    PT Duration  8 weeks    PT Treatment/Interventions  ADLs/Self Care Home Management;Aquatic Therapy;Gait training;Stair training;Therapeutic activities;Therapeutic exercise;Balance training;Neuromuscular re-education;Patient/family education    PT Next Visit Plan  land - exercises in seated position due to pt's request for non weight bearing; aquatic therapy to be scheduled    Consulted and Agree with Plan of Care  Patient       Patient will benefit from skilled therapeutic intervention in order to improve the following deficits and impairments:  Abnormal gait, Decreased balance, Decreased mobility, Pain, Decreased strength, Impaired sensation, Decreased activity tolerance  Visit Diagnosis: Muscle weakness (generalized)     Problem List Patient Active Problem List   Diagnosis Date Noted  . MVP (mitral valve prolapse) 12/17/2017  . Rosacea 11/02/2017  . Chronic pain syndrome 11/02/2017  . Small intestinal bacterial overgrowth  08/05/2017  . Orthostatic hypotension 11/20/2016  . Diabetes ([place]) 11/18/2016  . Hypoglycemia 11/08/2016  . Cervical facet joint syndrome 10/08/2016  . Anal fissure 10/08/2016  . Peripheral vascular disease ([place]) 01/22/2016  . Hypertension 01/22/2016  . Prediabetes 01/22/2016  . H/O hyperparathyroidism 01/22/2016  . Mitral regurgitation 12/11/2015  . Vitamin D deficiency 03/08/2015  . H/O gastric bypass 02/11/2014  . Chronic maxillary sinusitis 09/15/2013  . Personal history of colonic polyps 07/22/2013  . HA (headache)-chronic/tension type 07/28/2012  . Osteoporosis 07/28/2012  . Unspecified vitamin D deficiency-2009 07/28/2012  . Nephrolithiasis 07/28/2012  . Fibromyalgia 07/26/2012  . Chronic interstitial cystitis 10/16/2011  . Chronic constipation 10/04/2010  . WEIGHT LOSS 11/27/2009  . PERSONAL HISTORY  OF FAILED MODERATE SEDATION 06/01/2008  . Anxiety state 03/18/2007  . Reactive depression 03/18/2007  . GERD 03/18/2007  . Diaphragmatic hernia 03/18/2007  . Osteoarthritis 03/18/2007  . [place] DISEASE, CERVICAL 03/18/2007  . [place] DISEASE, LUMBAR 03/18/2007  . Myalgia and myositis 03/18/2007    Alda Lea, PT 02/10/2018, 8:32 PM  [facility] [address] [place], [place], [address] Phone: [phone]   Fax:  [phone]  Name: Kayla [name] MRN: [id number] Date of Birth: 1945-11-07

## 2018-02-10 ENCOUNTER — Ambulatory Visit (INDEPENDENT_AMBULATORY_CARE_PROVIDER_SITE_OTHER): Payer: Medicare Other | Admitting: Psychiatry

## 2018-02-10 ENCOUNTER — Encounter: Payer: Self-pay | Admitting: Physical Therapy

## 2018-02-10 DIAGNOSIS — F331 Major depressive disorder, recurrent, moderate: Secondary | ICD-10-CM

## 2018-02-11 ENCOUNTER — Telehealth: Payer: Self-pay | Admitting: Endocrinology

## 2018-02-11 ENCOUNTER — Other Ambulatory Visit: Payer: Self-pay | Admitting: Family Medicine

## 2018-02-11 ENCOUNTER — Other Ambulatory Visit: Payer: Self-pay

## 2018-02-11 DIAGNOSIS — K2100 Gastro-esophageal reflux disease with esophagitis, without bleeding: Secondary | ICD-10-CM

## 2018-02-11 DIAGNOSIS — K3189 Other diseases of stomach and duodenum: Secondary | ICD-10-CM

## 2018-02-11 DIAGNOSIS — K449 Diaphragmatic hernia without obstruction or gangrene: Secondary | ICD-10-CM

## 2018-02-11 DIAGNOSIS — F411 Generalized anxiety disorder: Secondary | ICD-10-CM

## 2018-02-11 DIAGNOSIS — E119 Type 2 diabetes mellitus without complications: Secondary | ICD-10-CM

## 2018-02-11 MED ORDER — ACARBOSE 25 MG PO TABS: ORAL_TABLET | ORAL | 3 refills | Status: DC

## 2018-02-11 MED ORDER — MELOXICAM 15 MG PO TABS: 15.0000 mg | ORAL_TABLET | Freq: Every day | ORAL | 1 refills | Status: DC

## 2018-02-11 MED ORDER — MIRABEGRON ER 25 MG PO TB24: 25.0000 mg | ORAL_TABLET | Freq: Every day | ORAL | 1 refills | Status: DC

## 2018-02-11 MED ORDER — FREESTYLE LIBRE 14 DAY SENSOR MISC: 1.0000 | 2 refills | Status: DC

## 2018-02-11 MED ORDER — FREESTYLE LIBRE 14 DAY SENSOR MISC: 1.0000 [IU] | 5 refills | Status: DC

## 2018-02-11 NOTE — Telephone Encounter (Signed)
acarbose (PRECOSE) 25 MG tablet 270 tablet 3 02/11/2018    Sig: TAKE 1 TABLET BY MOUTH 3 TIMES DAILY WITH MEALS.   Sent to pharmacy as: acarbose (PRECOSE) 25 MG tablet   E-Prescribing Status: Sent to pharmacy (02/11/2018  4:51 PM EST)    Continuous Blood Gluc Sensor (FREESTYLE LIBRE 14 DAY SENSOR) MISC 3 each 2 02/11/2018    Sig - Route: 1 each by Does not apply route See admin instructions. Use with continuous glucose monitor device. Apply to skin every 14 days. - Does not apply   Sent to pharmacy as: Continuous Blood Gluc Sensor (FREESTYLE LIBRE 14 DAY SENSOR) Misc   Notes to Pharmacy: Pt is requesting refill because needle on her sensor bent. Insurance will likely deny as a refill too soon. Sensor device malfunctioned and should be refilled   E-Prescribing Status: Sent to pharmacy (02/11/2018  4:57 PM EST)    [facility] [place], [place] - [address]

## 2018-02-11 NOTE — Telephone Encounter (Signed)
Requested medication (s) are due for refill today: Yes  Requested medication (s) are on the active medication list: Yes  Last refill:  Trazodone 11/08/16; Klonopin 03/29/17; Celebrex (by historical provider); Nexium 11/08/16; Zofran 03/25/17  Future visit scheduled:No  Notes to clinic:  Unable to refill per protocol      Requested Prescriptions  Pending Prescriptions Disp Refills   celecoxib (CELEBREX) 200 MG capsule      Sig: Take 1 capsule (200 mg total) by mouth 2 (two) times daily.     Analgesics:  COX2 Inhibitors Passed - 02/11/2018  5:14 PM      Passed - HGB in normal range and within 360 days    Hemoglobin  Date Value Ref Range Status  01/22/2018 11.6 11.1 - 15.9 g/dL Final         Passed - Cr in normal range and within 360 days    Creat  Date Value Ref Range Status  09/10/2016 0.74 0.50 - 0.99 mg/dL Final    Comment:      For patients > or = 71 years of age: The upper reference limit for Creatinine is approximately 13% higher for people identified as African-American.      Creatinine, Ser  Date Value Ref Range Status  01/22/2018 0.57 0.57 - 1.00 mg/dL Final         Passed - Patient is not pregnant      Passed - Valid encounter within last 12 months    Recent Outpatient Visits          2 weeks ago Type 2 diabetes mellitus with hypoglycemia without coma, without long-term current use of insulin [facility])   Primary Care at [facility], [place], MD   1 month ago Copper deficiency myeloneuropathy [facility])   [facility] at [facility], Laurey Arrow, MD   5 months ago Gastroesophageal reflux disease with esophagitis   [facility] Care at [facility], Laurey Arrow, MD   6 months ago Nausea without vomiting   Primary Care at [facility], Laurey Arrow, MD   7 months ago Iron deficiency anemia, unspecified iron deficiency anemia type   Primary Care at [facility], Laurey Arrow, MD            clonazePAM (KLONOPIN) 1 MG tablet 180 tablet 1    Sig: Take 0.5-1 tablets (0.5-1 mg total) by  mouth 2 (two) times daily as needed for anxiety.     Not Delegated - Psychiatry:  Anxiolytics/Hypnotics Failed - 02/11/2018  5:14 PM      Failed - This refill cannot be delegated      Failed - Urine Drug Screen completed in last 360 days.      Passed - Valid encounter within last 6 months    Recent Outpatient Visits          2 weeks ago Type 2 diabetes mellitus with hypoglycemia without coma, without long-term current use of insulin [facility])   Primary Care at [facility], [place], MD   1 month ago Copper deficiency myeloneuropathy [facility])   Primary Care at [facility], Laurey Arrow, MD   5 months ago Gastroesophageal reflux disease with esophagitis   [facility] Care at [facility], Laurey Arrow, MD   6 months ago Nausea without vomiting   Primary Care at [facility], Laurey Arrow, MD   7 months ago Iron deficiency anemia, unspecified iron deficiency anemia type   Primary Care at [facility], Laurey Arrow, MD  esomeprazole (NEXIUM) 40 MG capsule 90 capsule 3    Sig: Take 1 capsule (40 mg total) by mouth daily before breakfast.     Gastroenterology: Proton Pump Inhibitors Passed - 02/11/2018  5:14 PM      Passed - Valid encounter within last 12 months    Recent Outpatient Visits          2 weeks ago Type 2 diabetes mellitus with hypoglycemia without coma, without long-term current use of insulin [facility])   Primary Care at [facility], Ines Bloomer, MD   1 month ago Copper deficiency myeloneuropathy [facility])   [facility] Care at [facility], Laurey Arrow, MD   5 months ago Gastroesophageal reflux disease with esophagitis   [facility] Care at [facility], Laurey Arrow, MD   6 months ago Nausea without vomiting   Primary Care at [facility], Laurey Arrow, MD   7 months ago Iron deficiency anemia, unspecified iron deficiency anemia type   Primary Care at [facility], Laurey Arrow, MD            ondansetron (ZOFRAN) 8 MG tablet 60 tablet 0    Sig: Take 1 tablet (8 mg total) by mouth every 8 (eight) hours as needed for nausea or  vomiting.     Not Delegated - Gastroenterology: Antiemetics Failed - 02/11/2018  5:14 PM      Failed - This refill cannot be delegated      Passed - Valid encounter within last 6 months    Recent Outpatient Visits          2 weeks ago Type 2 diabetes mellitus with hypoglycemia without coma, without long-term current use of insulin [facility])   Primary Care at [facility], [place], MD   1 month ago Copper deficiency myeloneuropathy [facility])   [facility] Care at [facility], Laurey Arrow, MD   5 months ago Gastroesophageal reflux disease with esophagitis   [facility] Care at [facility], Laurey Arrow, MD   6 months ago Nausea without vomiting   Primary Care at [facility], Laurey Arrow, MD   7 months ago Iron deficiency anemia, unspecified iron deficiency anemia type   Primary Care at [facility], Laurey Arrow, MD            traZODone (DESYREL) 100 MG tablet 90 tablet 1    Sig: Take 0.5-1 tablets (50-100 mg total) by mouth at bedtime as needed for sleep. And 1/2 -1 if wakes at 4am.     Psychiatry: Antidepressants - Serotonin Modulator Passed - 02/11/2018  5:14 PM      Passed - Completed PHQ-2 or PHQ-9 in the last 360 days.      Passed - Valid encounter within last 6 months    Recent Outpatient Visits          2 weeks ago Type 2 diabetes mellitus with hypoglycemia without coma, without long-term current use of insulin [facility])   Primary Care at [facility], [place], MD   1 month ago Copper deficiency myeloneuropathy [facility])   [facility] at [facility], Laurey Arrow, MD   5 months ago Gastroesophageal reflux disease with esophagitis   [facility] Care at [facility], Laurey Arrow, MD   6 months ago Nausea without vomiting   Primary Care at [facility], Laurey Arrow, MD   7 months ago Iron deficiency anemia, unspecified iron deficiency anemia type   Primary Care at [facility], Laurey Arrow, MD           Signed  Prescriptions Disp Refills   meloxicam (MOBIC) 15 MG tablet 90 tablet 1    Sig: Take 1 tablet (15 mg total) by mouth daily.  Prn pain     Analgesics:  COX2 Inhibitors Passed - 02/11/2018  5:14 PM      Passed - HGB in normal range and within 360 days    Hemoglobin  Date Value Ref Range Status  01/22/2018 11.6 11.1 - 15.9 g/dL Final         Passed - Cr in normal range and within 360 days    Creat  Date Value Ref Range Status  09/10/2016 0.74 0.50 - 0.99 mg/dL Final    Comment:      For patients > or = 71 years of age: The upper reference limit for Creatinine is approximately 13% higher for people identified as African-American.      Creatinine, Ser  Date Value Ref Range Status  01/22/2018 0.57 0.57 - 1.00 mg/dL Final         Passed - Patient is not pregnant      Passed - Valid encounter within last 12 months    Recent Outpatient Visits          2 weeks ago Type 2 diabetes mellitus with hypoglycemia without coma, without long-term current use of insulin [facility])   Primary Care at [facility], [place], MD   1 month ago Copper deficiency myeloneuropathy [facility])   [facility] Care at [facility], Laurey Arrow, MD   5 months ago Gastroesophageal reflux disease with esophagitis   [facility] at [facility], Laurey Arrow, MD   6 months ago Nausea without vomiting   [facility] Care at [facility], Laurey Arrow, MD   7 months ago Iron deficiency anemia, unspecified iron deficiency anemia type   Primary Care at [facility], Laurey Arrow, MD            mirabegron ER (MYRBETRIQ) 25 MG TB24 tablet 90 tablet 1    Sig: Take 1 tablet (25 mg total) by mouth daily.     Urology: Bladder Agents - mirabegron Passed - 02/11/2018  5:14 PM      Passed - Last BP in normal range    BP Readings from Last 1 Encounters:  01/22/18 (!) 113/58         Passed - Valid encounter within last 12 months    Recent Outpatient Visits          2 weeks ago Type 2 diabetes mellitus with hypoglycemia without coma, without long-term current use of insulin [facility])   Primary Care at [facility], Ines Bloomer, MD   1 month ago Copper deficiency  myeloneuropathy [facility])   Primary Care at [facility], Laurey Arrow, MD   5 months ago Gastroesophageal reflux disease with esophagitis   [facility] at [facility], Laurey Arrow, MD   6 months ago Nausea without vomiting   Primary Care at [facility], Laurey Arrow, MD   7 months ago Iron deficiency anemia, unspecified iron deficiency anemia type   Primary Care at [facility], Laurey Arrow, MD

## 2018-02-11 NOTE — Telephone Encounter (Signed)
Patient stated she was prescribe the Freestyle Libra. she is needing another prescription sent into the pharmacy. She has one that will be running out in 11 days;. Due to an MRI she had the first one she was wearing she had to throw away because they made her take the sensor off and she could not put it back on her arm due to the needle being bent, She said the pharmacy told her that if we sent in another prescription that we or the pharmacy could override because she is not due for a refill    [facility] - [place], [place] - [address]

## 2018-02-11 NOTE — Telephone Encounter (Signed)
MEDICATION: Free Style 14 Day Libre-2 Boxes per month  PHARMACY:  [facility] [place]  IS THIS A 90 DAY SUPPLY : Yes  IS PATIENT OUT OF MEDICATION: No  IF NOT; HOW MUCH IS LEFT: 11 days  LAST APPOINTMENT DATE: @1 /29/2020  NEXT APPOINTMENT DATE:@3 /17/2020  DO WE HAVE YOUR PERMISSION TO LEAVE A DETAILED MESSAGE:  OTHER COMMENTS: Patient [facility] but requests the above request to begin now to make sure she can get the above medication from [facility]   **Let patient know to contact pharmacy at the end of the day to make sure medication is ready. **  ** Please notify patient to allow 48-72 hours to process**  **Encourage patient to contact the pharmacy for refills or they can request refills through [facility]**

## 2018-02-11 NOTE — Telephone Encounter (Signed)
Rx for replacement of her sensor has been printed and placed on Dr. Cordelia Pen desk for signature. Will fax to [facility] once signed and returned.

## 2018-02-11 NOTE — Telephone Encounter (Signed)
This has been sent

## 2018-02-11 NOTE — Telephone Encounter (Signed)
MEDICATION: acarbose (PRECOSE) 25 MG tablet  PHARMACY:   [facility] - [place], [place] - [address] [phone] (Phone) [phone] (Fax)     IS THIS A 90 DAY SUPPLY : No  IS PATIENT OUT OF MEDICATION: No  IF NOT; HOW MUCH IS LEFT: 21  LAST APPOINTMENT DATE: @12 /13/2019  NEXT APPOINTMENT DATE:@3 /17/2020  DO WE HAVE YOUR PERMISSION TO LEAVE A DETAILED MESSAGE: Yes  OTHER COMMENTS:    **Let patient know to contact pharmacy at the end of the day to make sure medication is ready. **  ** Please notify patient to allow 48-72 hours to process**  **Encourage patient to contact the pharmacy for refills or they can request refills through [facility]**

## 2018-02-11 NOTE — Telephone Encounter (Signed)
Copied from [place] [id number]. Topic: Quick Communication - Rx Refill/Question >> Feb 11, 2018  4:59 PM Mcneil, Ja-Kwan wrote: Medication: celecoxib (CELEBREX) 200 MG capsule, esomeprazole (NEXIUM) 40 MG capsule, ondansetron (ZOFRAN) 8 MG tablet, traZODone (DESYREL) 100 MG tablet, clonazePAM (KLONOPIN) 1 MG tablet, meloxicam (MOBIC) 15 MG tablet, and mirabegron ER (MYRBETRIQ) 25 MG TB24 tablet  Has the patient contacted their pharmacy? No - Pt stated the [facility] does not [facility] refills patients are instructed to contact their doctor  Preferred Pharmacy (with phone number or street name): [facility] [place], [place] - [address] (Phone)  [phone] (Fax)   Agent: Please be advised that RX refills may take up to 3 business days. We ask that you follow-up with your pharmacy.

## 2018-02-12 NOTE — Telephone Encounter (Signed)
I am deferring to you. I ran PMP, mentions fentanyl and vicodin but no clonazepam. I see she uses [place] [facility] which does not share with PMP. However last rx was sent in March 2019. Please address and refill meds as appropriate. Thank you

## 2018-02-12 NOTE — Telephone Encounter (Signed)
Please advise 

## 2018-02-13 DIAGNOSIS — M792 Neuralgia and neuritis, unspecified: Secondary | ICD-10-CM | POA: Diagnosis not present

## 2018-02-13 MED ORDER — TRAZODONE HCL 100 MG PO TABS: 50.0000 mg | ORAL_TABLET | Freq: Every evening | ORAL | 1 refills | Status: DC | PRN

## 2018-02-13 MED ORDER — ONDANSETRON HCL 8 MG PO TABS: 8.0000 mg | ORAL_TABLET | Freq: Three times a day (TID) | ORAL | 0 refills | Status: DC | PRN

## 2018-02-13 MED ORDER — ESOMEPRAZOLE MAGNESIUM 40 MG PO CPDR: 40.0000 mg | DELAYED_RELEASE_CAPSULE | Freq: Every day | ORAL | 3 refills | Status: DC

## 2018-02-13 MED ORDER — CLONAZEPAM 1 MG PO TABS: 0.5000 mg | ORAL_TABLET | Freq: Two times a day (BID) | ORAL | 0 refills | Status: DC | PRN

## 2018-02-13 NOTE — Telephone Encounter (Signed)
Sent pt My Chart note  Also ran PMP inc [place] and all other states.  She is seeing pain medicine. Provided a 90d supply of lorazepam but informed pt future PCP may not be willing to rx this since since is on pain medicine and so she may want to est w/ psych for this, made recommendations for PCP at other office.  Refilled meds but declined celebrex as was rx'd meloxicam just a few days prior.

## 2018-02-14 NOTE — Telephone Encounter (Signed)
pls see note. thanks 

## 2018-02-16 ENCOUNTER — Telehealth: Payer: Self-pay | Admitting: Neurology

## 2018-02-16 NOTE — Telephone Encounter (Signed)
I will send a message to Andria Rhein about the Wheel chair before I call Pt.

## 2018-02-16 NOTE — Telephone Encounter (Signed)
Patient called and would like a refill on her medications Lyrica  (could not understand the other Medication name on voicemail). She uses [facility] [place] in [place]. She also was asking about a Agricultural engineer? She said Medicare told her there was no record of the Request. Please Call. Thanks

## 2018-02-17 ENCOUNTER — Ambulatory Visit: Payer: Medicare Other | Admitting: Psychiatry

## 2018-02-17 ENCOUNTER — Ambulatory Visit (INDEPENDENT_AMBULATORY_CARE_PROVIDER_SITE_OTHER): Payer: Medicare Other | Admitting: Psychiatry

## 2018-02-17 DIAGNOSIS — H01001 Unspecified blepharitis right upper eyelid: Secondary | ICD-10-CM | POA: Diagnosis not present

## 2018-02-17 DIAGNOSIS — F331 Major depressive disorder, recurrent, moderate: Secondary | ICD-10-CM | POA: Diagnosis not present

## 2018-02-17 DIAGNOSIS — H40023 Open angle with borderline findings, high risk, bilateral: Secondary | ICD-10-CM | POA: Diagnosis not present

## 2018-02-17 DIAGNOSIS — H16223 Keratoconjunctivitis sicca, not specified as Sjogren's, bilateral: Secondary | ICD-10-CM | POA: Diagnosis not present

## 2018-02-17 DIAGNOSIS — H524 Presbyopia: Secondary | ICD-10-CM | POA: Diagnosis not present

## 2018-02-17 DIAGNOSIS — H01004 Unspecified blepharitis left upper eyelid: Secondary | ICD-10-CM | POA: Diagnosis not present

## 2018-02-17 DIAGNOSIS — H52221 Regular astigmatism, right eye: Secondary | ICD-10-CM | POA: Diagnosis not present

## 2018-02-17 DIAGNOSIS — H04123 Dry eye syndrome of bilateral lacrimal glands: Secondary | ICD-10-CM | POA: Diagnosis not present

## 2018-02-17 LAB — HM DIABETES EYE EXAM

## 2018-02-19 ENCOUNTER — Ambulatory Visit: Payer: Medicare Other | Admitting: Physical Therapy

## 2018-02-19 MED ORDER — PREGABALIN 50 MG PO CAPS: 50.0000 mg | ORAL_CAPSULE | Freq: Three times a day (TID) | ORAL | 3 refills | Status: DC

## 2018-02-19 NOTE — Telephone Encounter (Signed)
Rcvd reply message from Andria Rhein:   "Yes I will check on this. I did send a previous message to my team that we have everything from Dr. Tomi Likens and are ready to submit to insurance. I will follow up. Thank you for reaching out!"  Called Pt, explained to her the claim is being submitted now that everything is in place. She understands. I am sending an Rx for Lyrica to [facility], the other medication the Pt mentioned in her message was a DME, the [facility] unit. She said [facility] will not pay for it, so no need to send that Rx per Pt

## 2018-02-19 NOTE — Addendum Note (Signed)
Addended by: Clois Comber on: 02/19/2018 11:00 AM   Modules accepted: Orders

## 2018-02-23 DIAGNOSIS — M792 Neuralgia and neuritis, unspecified: Secondary | ICD-10-CM | POA: Diagnosis not present

## 2018-02-23 DIAGNOSIS — Z9689 Presence of other specified functional implants: Secondary | ICD-10-CM | POA: Diagnosis not present

## 2018-02-23 DIAGNOSIS — G629 Polyneuropathy, unspecified: Secondary | ICD-10-CM | POA: Diagnosis not present

## 2018-02-24 ENCOUNTER — Ambulatory Visit: Payer: Medicare Other | Admitting: Psychiatry

## 2018-02-24 DIAGNOSIS — H04123 Dry eye syndrome of bilateral lacrimal glands: Secondary | ICD-10-CM | POA: Diagnosis not present

## 2018-02-24 DIAGNOSIS — H01004 Unspecified blepharitis left upper eyelid: Secondary | ICD-10-CM | POA: Diagnosis not present

## 2018-02-24 DIAGNOSIS — H01001 Unspecified blepharitis right upper eyelid: Secondary | ICD-10-CM | POA: Diagnosis not present

## 2018-02-24 DIAGNOSIS — H16223 Keratoconjunctivitis sicca, not specified as Sjogren's, bilateral: Secondary | ICD-10-CM | POA: Diagnosis not present

## 2018-02-26 ENCOUNTER — Ambulatory Visit: Payer: Medicare Other | Admitting: Physical Therapy

## 2018-02-27 ENCOUNTER — Telehealth: Payer: Self-pay | Admitting: Neurology

## 2018-02-27 NOTE — Telephone Encounter (Signed)
Called and spoke with Pt, advised her she will need to contact Andria Rhein concerning the wheel chair.  Pt wanted [place] to know she had the trial spine stimulator removed as planned, which she did not believe it was helping prior to the removal, but is now unable to stand her feet to even touch the floor. She has been in a borrowed wheelchair for 2 days. The pain specialist from [facility] who implanted the device suggested she wait 2-3 weeks to heal before implanting the permanent device. The Pt wanted you to be aware.

## 2018-02-27 NOTE — Telephone Encounter (Signed)
Patient is calling in about a submission for an electric wheelchair and she wants you to call medicare at [phone]. Thanks!

## 2018-03-03 ENCOUNTER — Ambulatory Visit: Payer: Medicare Other | Admitting: Psychiatry

## 2018-03-05 ENCOUNTER — Ambulatory Visit: Payer: Medicare Other | Admitting: Physical Therapy

## 2018-03-05 DIAGNOSIS — M797 Fibromyalgia: Secondary | ICD-10-CM | POA: Diagnosis not present

## 2018-03-05 DIAGNOSIS — R51 Headache: Secondary | ICD-10-CM | POA: Diagnosis not present

## 2018-03-05 DIAGNOSIS — G629 Polyneuropathy, unspecified: Secondary | ICD-10-CM | POA: Diagnosis not present

## 2018-03-10 ENCOUNTER — Ambulatory Visit: Payer: Medicare Other | Admitting: Psychiatry

## 2018-03-11 ENCOUNTER — Other Ambulatory Visit: Payer: Self-pay

## 2018-03-11 ENCOUNTER — Encounter: Payer: Self-pay | Admitting: Family Medicine

## 2018-03-11 ENCOUNTER — Ambulatory Visit (INDEPENDENT_AMBULATORY_CARE_PROVIDER_SITE_OTHER): Payer: Medicare Other | Admitting: Family Medicine

## 2018-03-11 VITALS — BP 101/62 | HR 101 | Temp 98.0°F | Resp 16 | Ht 65.0 in | Wt 107.0 lb

## 2018-03-11 DIAGNOSIS — M47812 Spondylosis without myelopathy or radiculopathy, cervical region: Secondary | ICD-10-CM

## 2018-03-11 DIAGNOSIS — E11649 Type 2 diabetes mellitus with hypoglycemia without coma: Secondary | ICD-10-CM | POA: Diagnosis not present

## 2018-03-11 DIAGNOSIS — M797 Fibromyalgia: Secondary | ICD-10-CM | POA: Diagnosis not present

## 2018-03-11 DIAGNOSIS — E162 Hypoglycemia, unspecified: Secondary | ICD-10-CM

## 2018-03-11 DIAGNOSIS — M5137 Other intervertebral disc degeneration, lumbosacral region: Secondary | ICD-10-CM

## 2018-03-11 DIAGNOSIS — M5030 Other cervical disc degeneration, unspecified cervical region: Secondary | ICD-10-CM

## 2018-03-11 DIAGNOSIS — R634 Abnormal weight loss: Secondary | ICD-10-CM

## 2018-03-11 DIAGNOSIS — M810 Age-related osteoporosis without current pathological fracture: Secondary | ICD-10-CM | POA: Diagnosis not present

## 2018-03-11 DIAGNOSIS — G894 Chronic pain syndrome: Secondary | ICD-10-CM | POA: Diagnosis not present

## 2018-03-11 NOTE — Patient Instructions (Addendum)
If you have lab work done today you will be contacted with your lab results within the next 2 weeks.  If you have not heard from [place] then please contact us. The fastest way to get your results is to register for My Chart.   IF you received an x-ray today, you will receive an invoice from [facility]. Please contact [facility] at [phone] with questions or concerns regarding your invoice.   IF you received labwork today, you will receive an invoice from [facility]. Please contact [facility] at [phone] with questions or concerns regarding your invoice.   Our billing staff will not be able to assist you with questions regarding bills from these companies.  You will be contacted with the lab results as soon as they are available. The fastest way to get your results is to activate your My Chart account. Instructions are located on the last page of this paperwork. If you have not heard from [place] regarding the results in 2 weeks, please contact this office.    Unfortunately, I will no longer be at Primary Care at [facility] after Friday May 08, 2018.   On Saturday May 09, 2018, my amazing business partner Michail Sermon and I will open a independent, small, boutique medical and mental health care practice called [facility]. Within the next few weeks, you will be able to find our website and facebook page on [place]. But for now, if you need more information about [facility], you may contact us at: Phone number: [phone] Email: [email or web address]   While we look forward to partnering with any person who wishes to establish with [place] as I continue to provide traditional primary medical care services including acute and chronic disease management and preventative health services in this unique setting, we aim to specifically tailor our services to meet the needs of our LGBTQ+ community with more convenience, access, and specialization.  Initially, this will mainly center around excellence in transgender hormone management, evaluation and letters for gender-affirming hormones, surgeries, and changes of gender markers.   For its inaugural few summer months, [place] will be open on a part-time basis only and operating out of  [facility]  [address] [place], [place] [address]   ( We hope to gather a sufficient volume of loyal clientele over the summer to justify [facility] leaving the nest in <6 months from its opening ,which will allow [place] to greatly expanded our office hours and services. Therefore, if you want to find me at any point in the future to receive care, and google has failed, misled, or confused you, please don't hesitate to contact [email or web address] for to receive the most up-to-date information on my practice location, website, and contact information.)  We are not yet able to start scheduling patients but will be soon.  If you would like to be put on a wait-list to be contacted once we are able to do so, please email [email or web address].    Your medical records are the property of [facility], so I will not have any access to them - including the notes I wrote, tests I ordered, and medications I prescribed - after 05/08/18.  Therefore, at your first visit at [facility], you have the option of EITHER assuming a false identity complete with ridiculous disguise and preposterous accent to see how long it takes me to figure out your true identity, OR spend the time discussing your medical concerns, reviewing preventative health measures, and  refilling medications.  Should you choose the latter, please stop by the front desk on your way out of the office today to sign a record release giving [facility] your permission to send a copy of your complete medical records to [facility].  Please request that your records are sent as soon as they are available to the address and/or fax number above so that I have time  to manually enter your medical history and information into [facility] record system prior to your initial visit.    If you are currently unsure or undecided as to what fortunate provider will have the honor to provide your future medical care, you can always go ahead and sign the record release for [facility] to share your complete medical chart with [facility] and vice versa, but request that the staff simply  scan the signed into your chart where it will be valid for a year. Should you decide to schedule an appointment with [facility] at some later date, [place] can then obtain a copy of your records with a simple phone call, saving you the hassel of driving to the office just to sign a piece of paper.

## 2018-03-11 NOTE — Progress Notes (Signed)
Subjective:    Patient: Kayla [name]  DOB: [birth year]; 73 y.o.   MRN: [id number]  Chief Complaint  Patient presents with  . Chronic Conditions    HPI Didn't initially think that the surgery did much until the stimulator was taken out and then it was worse than ever - so severe she couldn't leave the house - didn't have to use the wheelchair as much. Since then she has barely been able to get out of bed due to the pain.  So she is going to have March 6 for the final surgery.   She is going to follow with neurology and PCP only and can't keep on going onto so many different doctors.    She does need to see GI - as she does need colonoscopy but first she needs to get off a lot of meds. Recovery from surgery is 2-6 months so that the leads have time to adhere - loved PT - [facility] and she will go back after she is fully recovered and released from the surgeon.  She did get the wheelchair which she only uses   She is going to be following healthy diet.  She has had 27 surgeries - getting a chair lift.  Did stop the metanex totally -   Medical History Past Medical History:  Diagnosis Date  . Allergy   . Anal fissure 10/08/2016  . Anemia    anemia in the past  . Angina    takes Propanolol prn and it stops her chest  . Anxiety   . Barrett esophagus    not consistently present  . Cataract   . Chronic kidney disease    kidney stones and UTI  . Complication of anesthesia    either  BP or pulse dropped with last surgery  . DDD (degenerative disc disease)    cervical and lumbar  . Dementia ([place])   . Depression    post traumatic stress  disorder  . Diabetes ([facility]) 11/18/2016  . Diabetes mellitus    sugar goes up and down diet controlled  . Dysrhythmia    brought on by stress  . External hemorrhoids   . Fatigue   . Fibromyalgia    neuropathy knees ankles and toes, bladder  . GERD (gastroesophageal reflux disease)   . H/O hyperparathyroidism   . Headache(784.0)    migraines  . Hiatal hernia   . History of kidney stones   . Hypertension    3 small brain aneurysms  . Hypoglycemia 11/08/2016  . IBS (irritable bowel syndrome)   . Internal hemorrhoids   . Macular degeneration   . Migraines   . Mitral regurgitation 12/11/2015  . Osteoarthritis    all over  . Osteopetrosis   . Peripheral vascular disease ([facility])    superficial phlebitis in left calf  . Personal history of colonic polyps 07/22/2013   07/2013 - 3 small adenomas - repeat colonoscopy 2018  . Small intestinal bacterial overgrowth 08/05/2017   + lactulose breath test 06/2017 Xifaxan Rx   Past Surgical History:  Procedure Laterality Date  . ABDOMINAL HYSTERECTOMY    . APPENDECTOMY    . CARDIAC CATHETERIZATION  03/22/1991   EF 61%  . CARDIOVASCULAR STRESS TEST  10/03/2006  . CHOLECYSTECTOMY    . COLONOSCOPY  06/23/2008   normal  . DILATION AND CURETTAGE OF UTERUS     x2  . ESOPHAGUS SURGERY    . EXPLORATORY LAPAROTOMY  1993  . EYE SURGERY    .  GASTRIC BYPASS  1999  . GASTRIC RESTRICTION SURGERY  1991  . LASIK Bilateral   . Right Arm Surgery    . Right Knee Arthroscopy    . Rt wrist fx  2009  . stomach stappeling  1991  . STONE EXTRACTION WITH BASKET  2012  . TONSILLECTOMY    . TUBAL LIGATION    . UPPER GASTROINTESTINAL ENDOSCOPY  06/23/2008   w/Dil, Barrett's esophagus  . US ECHOCARDIOGRAPHY  01/21/2007   EF 55-60%  . US ECHOCARDIOGRAPHY  08/30/2003   EF 55-60%   Current Outpatient Medications on File Prior to Visit  Medication Sig Dispense Refill  . acarbose (PRECOSE) 25 MG tablet TAKE 1 TABLET BY MOUTH 3 TIMES DAILY WITH MEALS. 270 tablet 3  . aspirin-acetaminophen-caffeine (EXCEDRIN MIGRAINE) 400-867-61 MG tablet Take 2 tablets by mouth 2 (two) times daily.    . clonazePAM (KLONOPIN) 1 MG tablet Take 0.5-1 tablets (0.5-1 mg total) by mouth 2 (two) times daily as needed for anxiety. 180 tablet 0  . Continuous Blood Gluc Sensor (FREESTYLE LIBRE 14 DAY SENSOR) MISC 1 each by Does  not apply route See admin instructions. Use with continuous glucose monitor device. Apply to skin every 14 days. 3 each 2  . cyclobenzaprine (FLEXERIL) 10 MG tablet Take 1 tablet (10 mg total) by mouth 3 (three) times daily as needed for muscle spasms. 270 tablet 1  . cycloSPORINE (RESTASIS) 0.05 % ophthalmic emulsion Place 1 drop into both eyes 2 (two) times daily. 5.5 mL 5  . diclofenac sodium (VOLTAREN) 1 % GEL Apply 2 g topically 4 (four) times daily. 700 g 3  . dicyclomine (BENTYL) 10 MG capsule Take 1-2 tab 3 times daily AC as needed for spasms and cramping. 270 capsule 1  . diltiazem 2 % GEL Apply 1 application topically 2 (two) times daily as needed (pain from fissure). 30 g 2  . docusate sodium (COLACE) 250 MG capsule Take 1 capsule (250 mg total) by mouth daily. 90 capsule 3  . DULoxetine (CYMBALTA) 30 MG capsule Take 3 capsules (90 mg total) by mouth daily. 90 capsule 5  . Erenumab-aooe (AIMOVIG) 70 MG/ML SOAJ Inject 70 mg into the skin every 30 (thirty) days. 1 pen 11  . esomeprazole (NEXIUM) 40 MG capsule Take 1 capsule (40 mg total) by mouth daily before breakfast. 90 capsule 3  . famotidine (PEPCID) 40 MG tablet Take 1 tablet (40 mg total) by mouth at bedtime. 90 tablet 3  . fentaNYL (DURAGESIC - DOSED MCG/HR) 50 MCG/HR Place 50 mcg onto the skin every 3 (three) days.    . furosemide (LASIX) 40 MG tablet Take 1 tablet (40 mg total) by mouth daily as needed for edema. 90 tablet 0  . HYDROcodone-acetaminophen (NORCO) 7.5-325 MG tablet Take 1 tablet every 6 (six) hours as needed by mouth for moderate pain.    . hypromellose (SYSTANE OVERNIGHT THERAPY) 0.3 % GEL ophthalmic ointment Place 1 drop into both eyes at bedtime. Reported on 02/11/2015    . IRON PO Take by mouth.    Marland Kitchen L-Methylfolate-Algae-B12-B6 (METANX PO) Take 2 mg by mouth every 12 (twelve) hours.    . lidocaine (LIDODERM) 5 % Place 1 patch onto the skin as needed. Remove & Discard patch within 12 hours. May dispense as 3 month  supply 90 patch 3  . Lifitegrast (XIIDRA) 5 % SOLN Place 1 drop into both eyes daily.     Marland Kitchen lubiprostone (AMITIZA) 24 MCG capsule Take 1 capsule (24 mcg  total) by mouth 2 (two) times daily with a meal. 180 capsule 3  . meloxicam (MOBIC) 15 MG tablet Take 1 tablet (15 mg total) by mouth daily. Prn pain 90 tablet 1  . metroNIDAZOLE (METROGEL) 1 % gel Apply topically daily. 45 g 0  . mirabegron ER (MYRBETRIQ) 25 MG TB24 tablet Take 1 tablet (25 mg total) by mouth daily. 90 tablet 1  . mupirocin ointment (BACTROBAN) 2 % Apply 1 application topically 3 (three) times daily. 30 g 1  . naloxegol oxalate (MOVANTIK) 25 MG TABS tablet Take 25 mg by mouth daily.    . Naloxone HCl 0.4 MG/0.4ML SOAJ Administer 0.84m [place] at first sign of opioid overdose and repeat every 2 minutes as needed for resuscitation. Call 911 immediately 5 Package 0  . Nerve Stimulator (CEFALY KIT) DEVI 20 Minutes by Does not apply route as directed. 1 Device 0  . NON FORMULARY [facility]  Peripheral Neuropathy Cream- Bupivacaine 1%, Doxepin 3%, Gabapentin 6%, Pentoxifylline 3%, Topiramate 1% Apply 1-2 grams to affected area 3-4 times daily Qty. 120 gm 3 refills    . ondansetron (ZOFRAN) 8 MG tablet Take 1 tablet (8 mg total) by mouth every 8 (eight) hours as needed for nausea or vomiting. 60 tablet 0  . ONE TOUCH LANCETS MISC Use to check cbgs 6 times daily as directed by physician. Testing freq: hypoglycemia, hyperglycemia. ICD10: R73.03, E 16.2 600 each 5  . potassium chloride SA (K-DUR,KLOR-CON) 20 MEQ tablet Take 1 tablet (20 mEq total) by mouth daily as needed (only take along with the furosemide). 90 tablet 0  . pregabalin (LYRICA) 50 MG capsule Take 1 capsule (50 mg total) by mouth 3 (three) times daily. (Patient taking differently: Take 50 mg by mouth 2 (two) times daily. ) 90 capsule 3  . pregabalin (LYRICA) 50 MG capsule Take 1 capsule (50 mg total) by mouth 3 (three) times daily. 270 capsule 3  . Probiotic Product  (PROBIOTIC-10 PO) Take by mouth.     . SUMAtriptan (IMITREX) 100 MG tablet Take 1 tablet at earliest onset of headache, may repeat in 2 hours if headache persists or reoccurs. (Patient taking differently: Take 100 mg by mouth every 3 (three) days. ) 30 tablet 1  . tizanidine (ZANAFLEX) 2 MG capsule TAKE 1 CAPSULE (2 MG TOTAL) BY MOUTH 3 (THREE) TIMES DAILY.    . traZODone (DESYREL) 100 MG tablet Take 0.5-1 tablets (50-100 mg total) by mouth at bedtime as needed for sleep. And 1/2 -1 if wakes at 4am. 90 tablet 1  . valACYclovir (VALTREX) 1000 MG tablet Take 1 tablet (1,000 mg total) by mouth 3 (three) times daily. 21 tablet 0  . [DISCONTINUED] buPROPion (WELLBUTRIN) 75 MG tablet Take 75 mg by mouth daily after breakfast.      No current facility-administered medications on file prior to visit.    Allergies  Allergen Reactions  . Ambien [Zolpidem Tartrate]     Hallucinations  . Codeine Anaphylaxis  . Oxycodone-Acetaminophen Shortness Of Breath  . Tylox [Oxycodone-Acetaminophen] Anaphylaxis    Chest pain  . Darvocet [Propoxyphene N-Acetaminophen]     Chest pain  . Darvon [Propoxyphene] Itching  . Lorcet [Hydrocodone-Acetaminophen]     Chest pain  . Opium     hallucinations  . Vicodin [Hydrocodone-Acetaminophen] Other (See Comments)    Chest Pain   . Wheat Bran   . Amoxicillin Nausea And Vomiting    Reaction:  Migraine headache  . Penicillins Nausea And Vomiting  Migraine Headaches   Family History  Problem Relation Age of Onset  . Stroke Mother   . Lung cancer Mother   . Heart disease Mother   . Diabetes Mother   . Hypertension Father   . Stroke Father   . Lung cancer Father   . Heart disease Father   . Kidney disease Father   . Seizures Father        epilepsy, and sisters x 2  . Pancreatic cancer Sister   . Lung cancer Sister   . Colon cancer Maternal Aunt        12 relatives  . Uterine cancer Other        aunt  . Heart disease Other        grandmother  . Clotting  disorder Sister   . Heart disease Sister        x 2  . Kidney disease Sister        x 2  . Breast cancer Sister   . Colon cancer Paternal Aunt   . Colon cancer Paternal Aunt    Social History   Socioeconomic History  . Marital status: Married    Spouse name: Rushie Chestnut.  . Number of children: 2  . Years of education: Not on file  . Highest education level: Not on file  Occupational History  . Occupation: Disability    Employer: RETIRED  Social Needs  . Financial resource strain: Not on file  . Food insecurity:    Worry: Not on file    Inability: Not on file  . Transportation needs:    Medical: Not on file    Non-medical: Not on file  Tobacco Use  . Smoking status: Never Smoker  . Smokeless tobacco: Never Used  Substance and Sexual Activity  . Alcohol use: No  . Drug use: No  . Sexual activity: Not Currently  Lifestyle  . Physical activity:    Days per week: Not on file    Minutes per session: Not on file  . Stress: Not on file  Relationships  . Social connections:    Talks on phone: Not on file    Gets together: Not on file    Attends religious service: Not on file    Active member of club or organization: Not on file    Attends meetings of clubs or organizations: Not on file    Relationship status: Not on file  Other Topics Concern  . Not on file  Social History Narrative   She retired on disability   Married   2 Children   Depression screen [facility] 2/9 03/11/2018 01/22/2018 12/29/2017 08/21/2017 08/02/2017  Decreased Interest 0 0 0 0 0  Down, Depressed, Hopeless 0 0 0 0 0  PHQ - 2 Score 0 0 0 0 0  Altered sleeping - - - - -  Tired, decreased energy - - - - -  Change in appetite - - - - -  Feeling bad or failure about yourself  - - - - -  Trouble concentrating - - - - -  Moving slowly or fidgety/restless - - - - -  Suicidal thoughts - - - - -  PHQ-9 Score - - - - -  Difficult doing work/chores - - - - -  Some recent data might be hidden    ROS As noted in  HPI  Objective:  BP 101/62   Pulse (!) 101   Temp 98 F (36.7 C) (Oral)   Resp  16   Ht 5' 5" (1.651 m)   Wt 107 lb (48.5 kg)   SpO2 98%   BMI 17.81 kg/m  Physical Exam Constitutional:      General: She is not in acute distress.    Appearance: She is well-groomed. She is cachectic. She is not diaphoretic.  HENT:     Head: Normocephalic and atraumatic.     Right Ear: External ear normal.     Left Ear: External ear normal.  Eyes:     General: No scleral icterus.    Conjunctiva/sclera: Conjunctivae normal.  Neck:     Musculoskeletal: Normal range of motion and neck supple.     Thyroid: No thyromegaly.  Cardiovascular:     Rate and Rhythm: Normal rate and regular rhythm.     Heart sounds: Normal heart sounds.  Pulmonary:     Effort: Pulmonary effort is normal. No respiratory distress.     Breath sounds: Normal breath sounds.  Lymphadenopathy:     Cervical: No cervical adenopathy.  Skin:    General: Skin is warm and dry.     Findings: No erythema.  Neurological:     Mental Status: She is alert and oriented to person, place, and time.  Psychiatric:        Behavior: Behavior normal.     POC TESTING No visits with results within 3 Day(s) from this visit.  Latest known visit with results is:  Abstract on 03/06/2018  Component Date Value Ref Range Status  . HM Diabetic Eye Exam 02/17/2018 No Retinopathy  No Retinopathy Final     Assessment & Plan:   1. Hypoglycemia   2. Type 2 diabetes mellitus with hypoglycemia without coma, without long-term current use of insulin ([place])   3. DISC DISEASE, CERVICAL   4. DISC DISEASE, LUMBAR   5. Age-related osteoporosis without current pathological fracture   6. WEIGHT LOSS   7. Fibromyalgia   8. Cervical facet joint syndrome   9. Chronic pain syndrome     Patient will continue on current chronic medications other than changes noted above, so ok to refill when needed.   See after visit summary for patient specific  instructions.  No orders of the defined types were placed in this encounter.   No orders of the defined types were placed in this encounter.   Patient verbalized to me that they understand the following: diagnosis, what is being done for them, what to expect and what should be done at home.  Their questions have been answered. They understand that I am unable to predict every possible medication interaction or adverse outcome and that if any unexpected symptoms arise, they should contact us and their pharmacist, as well as never hesitate to seek urgent/emergent care at [facility] or ER if they think it might be warranted.    [name], MD, MPH Primary Care at [place], [place]  [address] [phone] Office phone  [phone] Office fax  03/11/18 10:14 AM

## 2018-03-17 ENCOUNTER — Ambulatory Visit: Payer: Medicare Other | Admitting: Psychiatry

## 2018-03-20 ENCOUNTER — Ambulatory Visit: Payer: Self-pay | Admitting: Neurology

## 2018-03-20 ENCOUNTER — Encounter: Payer: Self-pay | Admitting: Family Medicine

## 2018-03-20 DIAGNOSIS — M792 Neuralgia and neuritis, unspecified: Secondary | ICD-10-CM | POA: Diagnosis not present

## 2018-03-20 DIAGNOSIS — M545 Low back pain: Secondary | ICD-10-CM | POA: Diagnosis not present

## 2018-03-24 ENCOUNTER — Ambulatory Visit: Payer: Medicare Other | Admitting: Psychiatry

## 2018-03-31 ENCOUNTER — Ambulatory Visit: Payer: Self-pay | Admitting: Endocrinology

## 2018-04-02 DIAGNOSIS — Z9689 Presence of other specified functional implants: Secondary | ICD-10-CM | POA: Diagnosis not present

## 2018-04-02 DIAGNOSIS — Z79891 Long term (current) use of opiate analgesic: Secondary | ICD-10-CM | POA: Diagnosis not present

## 2018-04-02 DIAGNOSIS — G894 Chronic pain syndrome: Secondary | ICD-10-CM | POA: Diagnosis not present

## 2018-04-02 DIAGNOSIS — M792 Neuralgia and neuritis, unspecified: Secondary | ICD-10-CM | POA: Diagnosis not present

## 2018-04-02 DIAGNOSIS — G629 Polyneuropathy, unspecified: Secondary | ICD-10-CM | POA: Diagnosis not present

## 2018-04-03 ENCOUNTER — Other Ambulatory Visit: Payer: Self-pay | Admitting: Internal Medicine

## 2018-04-03 DIAGNOSIS — Z1231 Encounter for screening mammogram for malignant neoplasm of breast: Secondary | ICD-10-CM

## 2018-04-07 ENCOUNTER — Ambulatory Visit: Payer: Medicare Other | Admitting: Psychiatry

## 2018-04-14 ENCOUNTER — Ambulatory Visit: Payer: Medicare Other | Admitting: Psychiatry

## 2018-04-28 ENCOUNTER — Ambulatory Visit: Payer: Medicare Other | Admitting: Psychiatry

## 2018-05-04 DIAGNOSIS — G629 Polyneuropathy, unspecified: Secondary | ICD-10-CM | POA: Diagnosis not present

## 2018-05-05 ENCOUNTER — Ambulatory Visit: Payer: Medicare Other | Admitting: Psychiatry

## 2018-05-10 IMAGING — MR MR LUMBAR SPINE W/O CM
5 series · 48 of 48 positions shown · non-contrast
Comparison: Lumbar spine radiographs 04/03/2017. CT abdomen and
pelvis 04/02/2011.

CLINICAL DATA: Chronic low back pain. Bilateral arm and leg
numbness. Heaviness in the feet.

EXAM:
MRI LUMBAR SPINE WITHOUT CONTRAST
TECHNIQUE: Multiplanar, multisequence MR imaging of the lumbar spine was
performed. No intravenous contrast was administered.

[Series 3: T2 post-contrast · sagittal · 4.0mm · 0.88mm/px · 6 of 14 slices shown]
[im 1/14]
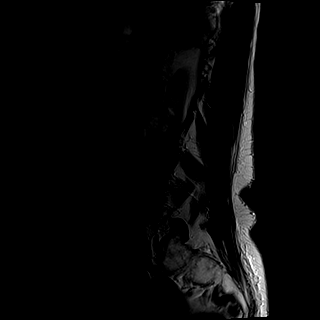
[im 3/14]
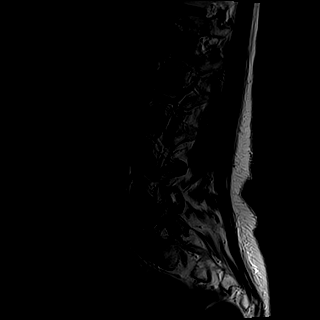
[im 6/14]
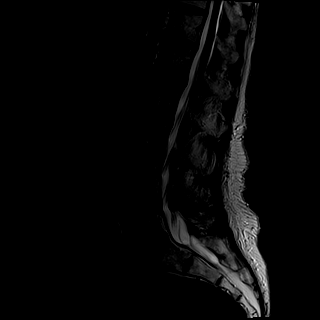
[im 8/14]
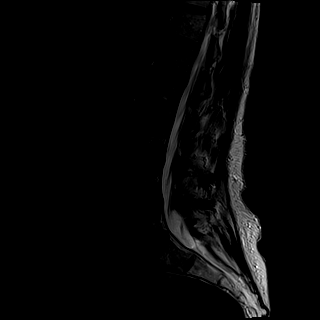
[im 11/14]
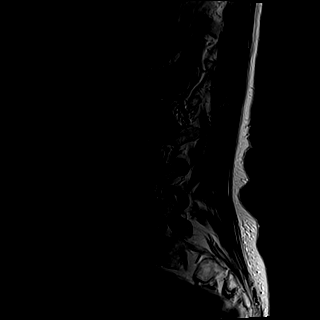
[im 14/14]
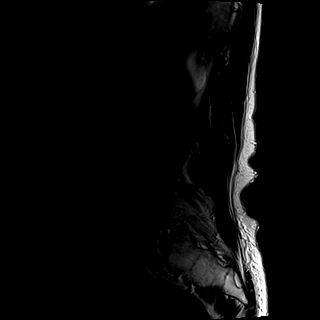

[Series 4: T1 · sagittal · 4.0mm · 0.88mm/px · 6 of 14 slices shown (1 of 2)]
[im 1/14]
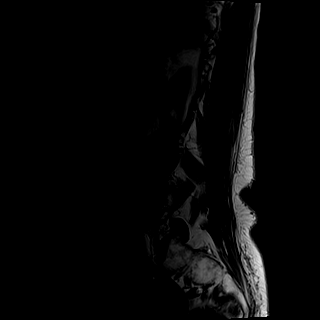
[im 3/14]
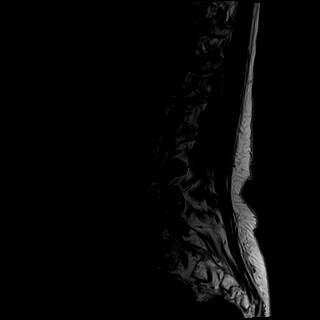
[im 6/14]
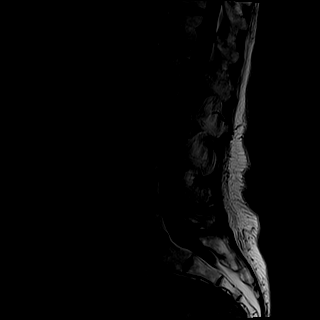
[im 8/14]
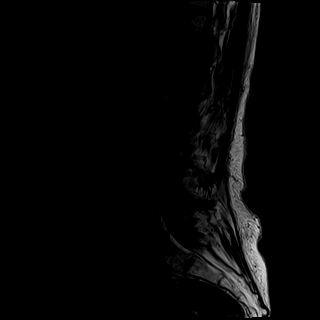
[im 11/14]
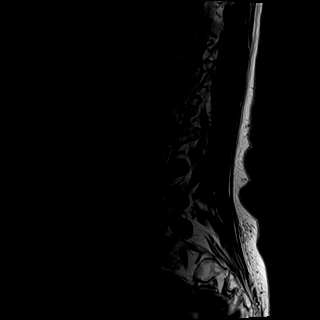
[im 14/14]
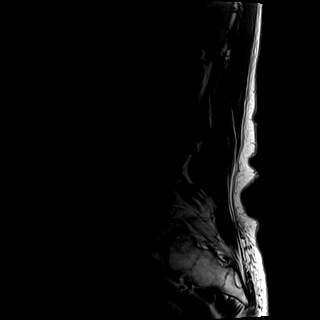

[Series 5: tirm sag · sagittal · 4.0mm · 0.73mm/px · 6 of 14 slices shown]
[im 1/14]
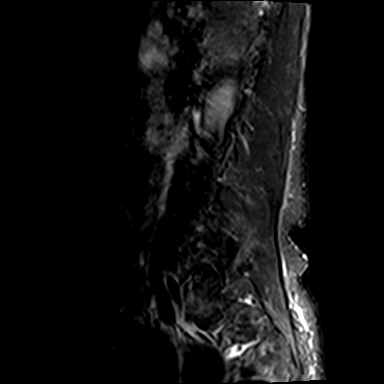
[im 3/14]
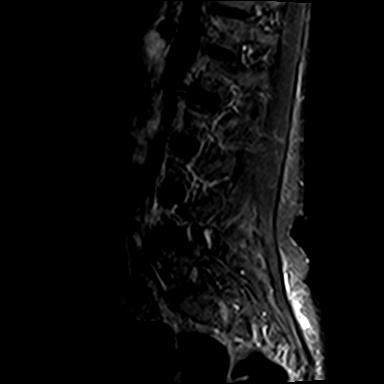
[im 6/14]
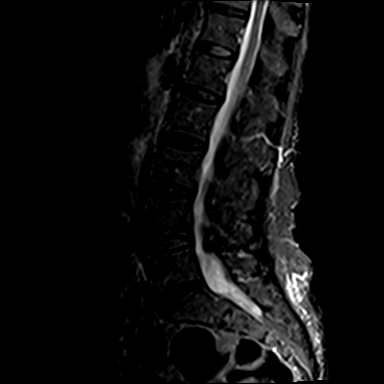
[im 8/14]
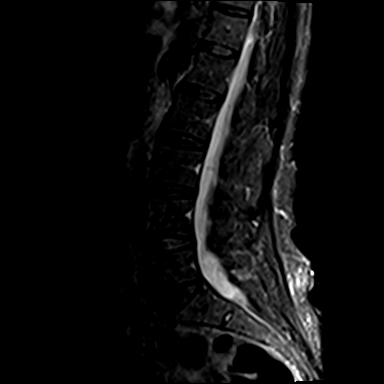
[im 11/14]
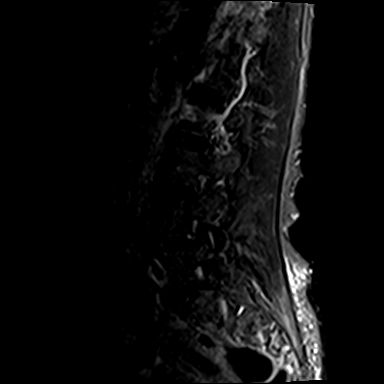
[im 14/14]
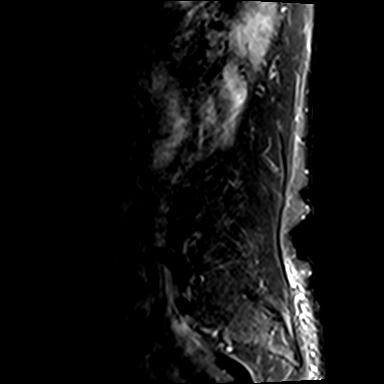

[Series 6: T1 · axial · 4.0mm · 0.78mm/px · z∈[-96,+143]mm · 15 of 38 slices shown (2 of 2)]
[im 1/38]
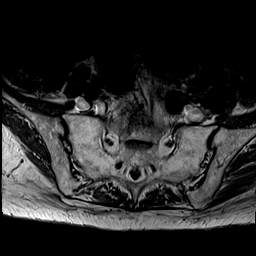
[im 3/38]
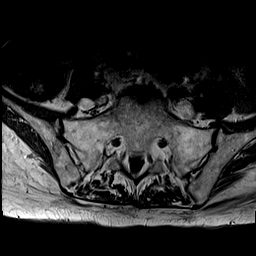
[im 6/38]
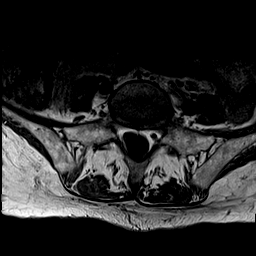
[im 8/38]
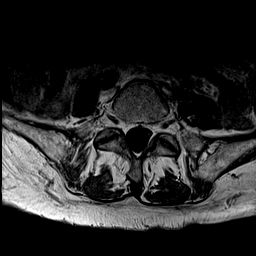
[im 11/38]
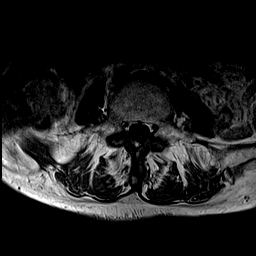
[im 14/38]
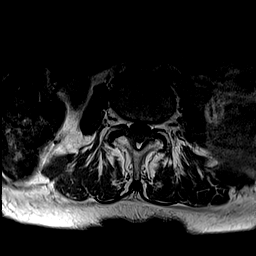
[im 16/38]
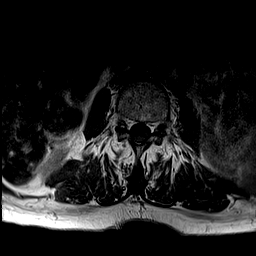
[im 19/38]
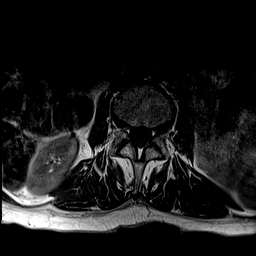
[im 22/38]
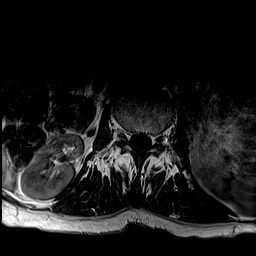
[im 24/38]
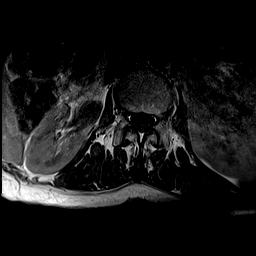
[im 27/38]
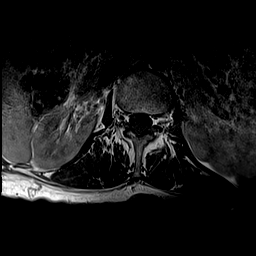
[im 30/38]
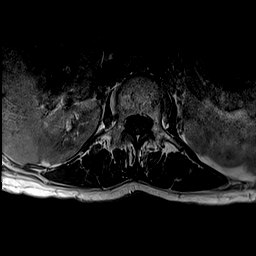
[im 32/38]
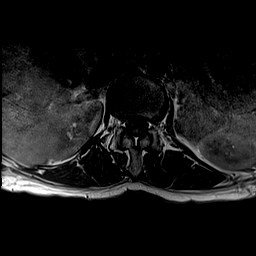
[im 35/38]
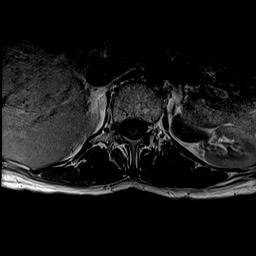
[im 38/38]
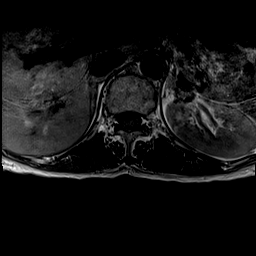

[Series 7: T2 · axial · 4.0mm · 0.78mm/px · z∈[-96,+143]mm · 15 of 38 slices shown]
[im 1/38]
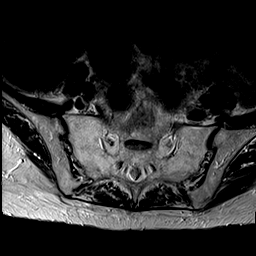
[im 3/38]
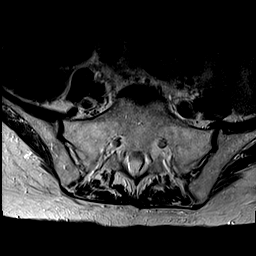
[im 6/38]
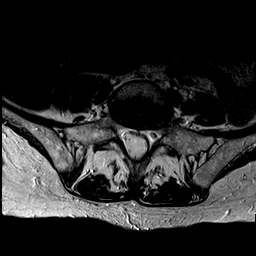
[im 8/38]
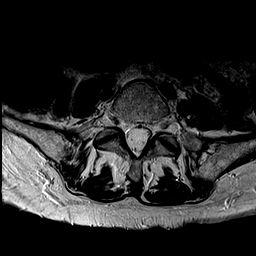
[im 11/38]
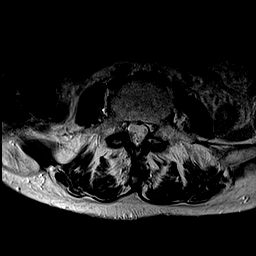
[im 14/38]
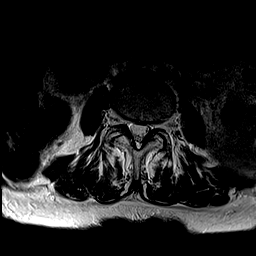
[im 16/38]
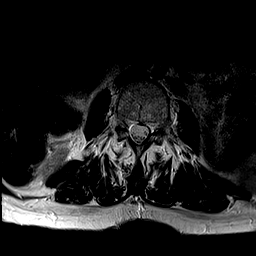
[im 19/38]
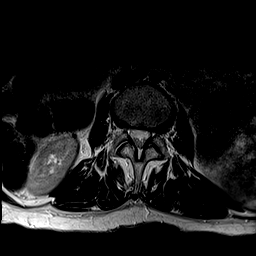
[im 22/38]
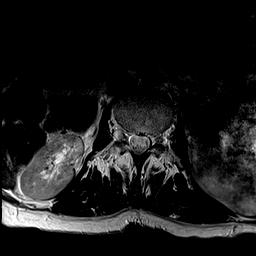
[im 24/38]
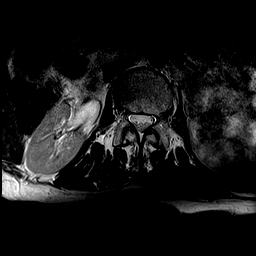
[im 27/38]
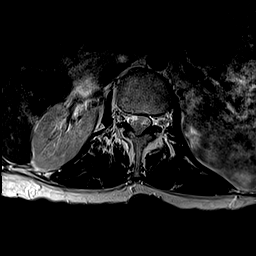
[im 30/38]
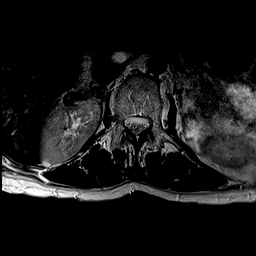
[im 32/38]
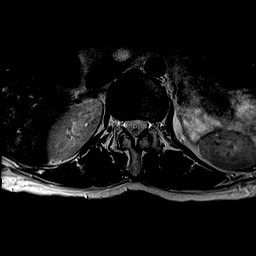
[im 35/38]
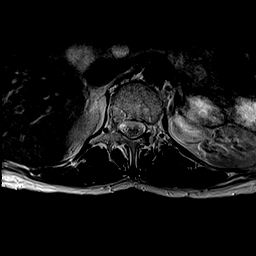
[im 38/38]
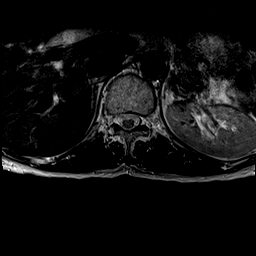

[48 of 48 positions shown; findings below may reference images not displayed]

FINDINGS: Segmentation:  Standard.

Alignment:  Normal.

Vertebrae: No fracture, suspicious osseous lesion, or significant
marrow edema.

Conus medullaris and cauda equina: Conus extends to the upper L2
level. Conus and cauda equina appear normal.

Paraspinal and other soft tissues: Unchanged mild common bile duct
dilatation compared to the prior CT, 9 mm diameter and likely
related to prior cholecystectomy.

Disc levels:

Disc desiccation throughout the lumbar spine. Preserved disc space
heights.

L1-2: Minimal disc bulging without stenosis.

L2-3: Left foraminal and extraforaminal disc protrusion with annular
fissure without stenosis or evidence of L2 nerve impingement.

L3-4: Mild disc bulging greater to the left without stenosis.

L4-5: Mild disc bulging and mild-to-moderate facet and ligamentum
flavum hypertrophy result in borderline to mild left lateral recess
stenosis without spinal or neural foraminal stenosis. A tiny left
foraminal annular fissure is noted. A 2 mm synovial cyst projects
anteriorly from the right facet joint without contributing to right
foraminal stenosis or neural impingement.

L5-S1: Minimal disc bulging and minimal facet arthrosis without
stenosis.
IMPRESSION: 1. Mild lumbar spondylosis without evidence of neural impingement.
2. Mild to moderate L4-5 facet arthrosis.

## 2018-05-12 ENCOUNTER — Ambulatory Visit: Payer: Medicare Other | Admitting: Psychiatry

## 2018-05-13 ENCOUNTER — Ambulatory Visit: Payer: Self-pay

## 2018-05-14 DIAGNOSIS — H31091 Other chorioretinal scars, right eye: Secondary | ICD-10-CM | POA: Diagnosis not present

## 2018-05-14 DIAGNOSIS — H43812 Vitreous degeneration, left eye: Secondary | ICD-10-CM | POA: Diagnosis not present

## 2018-05-14 DIAGNOSIS — H35361 Drusen (degenerative) of macula, right eye: Secondary | ICD-10-CM | POA: Diagnosis not present

## 2018-05-14 DIAGNOSIS — E119 Type 2 diabetes mellitus without complications: Secondary | ICD-10-CM | POA: Diagnosis not present

## 2018-05-19 ENCOUNTER — Ambulatory Visit: Payer: Medicare Other | Admitting: Psychiatry

## 2018-05-26 ENCOUNTER — Ambulatory Visit: Payer: Medicare Other | Admitting: Psychiatry

## 2018-05-28 DIAGNOSIS — H01005 Unspecified blepharitis left lower eyelid: Secondary | ICD-10-CM | POA: Diagnosis not present

## 2018-05-28 DIAGNOSIS — H01002 Unspecified blepharitis right lower eyelid: Secondary | ICD-10-CM | POA: Diagnosis not present

## 2018-05-28 DIAGNOSIS — H01001 Unspecified blepharitis right upper eyelid: Secondary | ICD-10-CM | POA: Diagnosis not present

## 2018-05-28 DIAGNOSIS — H01004 Unspecified blepharitis left upper eyelid: Secondary | ICD-10-CM | POA: Diagnosis not present

## 2018-06-02 ENCOUNTER — Ambulatory Visit: Payer: Medicare Other | Admitting: Psychiatry

## 2018-06-09 ENCOUNTER — Ambulatory Visit: Payer: Medicare Other | Admitting: Psychiatry

## 2018-06-23 ENCOUNTER — Telehealth: Payer: Self-pay | Admitting: Neurology

## 2018-06-23 DIAGNOSIS — G43C0 Periodic headache syndromes in child or adult, not intractable: Secondary | ICD-10-CM

## 2018-06-23 DIAGNOSIS — G43709 Chronic migraine without aura, not intractable, without status migrainosus: Secondary | ICD-10-CM

## 2018-06-23 MED ORDER — PREGABALIN 50 MG PO CAPS: 50.0000 mg | ORAL_CAPSULE | Freq: Three times a day (TID) | ORAL | 3 refills | Status: DC

## 2018-06-23 MED ORDER — ERENUMAB-AOOE 70 MG/ML ~~LOC~~ SOAJ: 70.0000 mg | SUBCUTANEOUS | 11 refills | Status: DC

## 2018-06-23 MED ORDER — SUMATRIPTAN SUCCINATE 100 MG PO TABS: ORAL_TABLET | ORAL | 1 refills | Status: DC

## 2018-06-23 NOTE — Telephone Encounter (Signed)
Pt states that she needs a refill on the medication on lyrica 50 mg   imitrex 100mg     cymbalta and the aimovig    [facility] [place]   She also wants to stop the antidpressant if possible

## 2018-06-23 NOTE — Telephone Encounter (Signed)
Called and spoke with Pt. I advised her will send in refills for Lyrica 50 mg, Imitrex 100 mg, and Aimovig 70 mg.  Per Dr. Tomi Likens, Pt ok to d/c Cymbalta by decreasing to 2 caps QD for 1  Week; then 1 cap QD for 1 week, then stop. Pt states she is only taking 2 cpas QD now. Per Dr. Tomi Likens, take 1 cap for 1 week; then 1 cap QOD for 1 week then stop. Pt verbalized her understanding.

## 2018-06-26 ENCOUNTER — Encounter: Payer: Self-pay | Admitting: Neurology

## 2018-06-26 ENCOUNTER — Ambulatory Visit: Payer: Self-pay

## 2018-06-29 ENCOUNTER — Ambulatory Visit: Payer: Medicare Other | Admitting: Family Medicine

## 2018-06-30 ENCOUNTER — Telehealth: Payer: Self-pay | Admitting: Neurology

## 2018-06-30 NOTE — Progress Notes (Signed)
Virtual Visit via Video Note The purpose of this virtual visit is to provide medical care while limiting exposure to the novel coronavirus.    Consent was obtained for video visit:  Yes.   Answered questions that patient had about telehealth interaction:  Yes.   I discussed the limitations, risks, security and privacy concerns of performing an evaluation and management service by telemedicine. I also discussed with the patient that there may be a patient responsible charge related to this service. The patient expressed understanding and agreed to proceed.  Pt location: Home Physician Location: Home Name of referring provider:  Shawnee Knapp, MD I connected with Kayla [name] at patients initiation/request on 07/01/2018 at 10:10 AM EDT by video enabled telemedicine application and verified that I am speaking with the correct person using two identifiers. Pt MRN:  [id number] Pt DOB:  12-Apr-1945 Video Participants:  Kayla [name]   History of Present Illness:  Kayla [name] is a 73 year old woman with fibromyalgia, anxiety with history of PTSD, degenerative disc disease of cervical and lumbar spine, and history of nephrolithiasis and gastric bypass who follows up for polyneuropathy and migraine.  UPDATE: Pain persists.  She had an spinal cord stimulator which initially was helpful but pain has become significant.  She had wanted to try discontinuing Cymbalta but noted it had helped with her pain as well as her depression, so she restarted it.    Neuropathy: .  Pain is still a significant issue.  Her pain doctor recently left.  Migraine: Migraines are infrequent.  They are moderate. They last a an hour with sumatriptan.  Excedrin ineffective.  They occur once a week to once every other week. Current NSAIDS:  Mobic Current analgesics:  Excedrin Migraine, Lidoderm, voltaren 1%, Fentanyl patch Current triptans:  Sumatriptan 142m Current ergotamine:  no Current anti-emetic:   no Current muscle relaxants:  Flexeril Current anti-anxiolytic:  clonazepam Current sleep aide:  trazodone Current Antihypertensive medications:  Lasix Current Antidepressant medications:  Cymbalta 911mtwice daily, Lyrica 5017mwice daily (three times daily caused confusion) Current Anticonvulsant medications:  No Current anti-CGRP:  Aimovig 25m71mrrent Vitamins/Herbal/Supplements:  None Current Antihistamines/Decongestants:  no Other therapy:  Physical therapy, [facility]  Caffeine:  2 cups of coffee daily Alcohol:  no Smoker:  no Diet:  poor Exercise:  no Depression:  no; Anxiety:  yes Other pain:  no Sleep hygiene:  poor  HISTORY: I Neuropathy: Since July 2018, she has had increased swelling and burning numbness and tingling in the legs. She had lower extremity vascular ABIs on 08/20/16, which was negative. She has history of B12 deficiency but she takes injections and recent B12 level from 09/21/16 was over 2000. Methylmalonic acid level was 209, RPR nonreactive, homocysteine 7.8. Labs from 07/12/16 include normal SPEP, Sed Rate 2, CRP less than 0.3, and TSH 2.430 . Vitamin D was 23 and she was advised to start supplementation. Serum glucose has been 70s up to 110. She also takes B6. She also has history of weight loss. She has fibromyalgia and was previously diagnosed with neuropathy by another neurologist. She has been on gabapentin for many years. It was briefly discontinued to see if it was contributing to the swelling. She reported no significant change, so it was restarted (she takes 300mg23mimes daily), although she thinks it helped a little bit. NCV-EMG from 10/21/16 demonstrated chronic sensorimotor polyneuropathy of the predominantly axonal type as well as mild left median neuropathy at or distal to  the wrist. Other labs include B1 146.1, B6 10.7, Sed Rate 2, HIV negative, TSH 1.780, UPEP negative.  She went to [facility] for evaluation of neuropathy.  Selenium, Zinc,  paraneoplastic panel and genetic testing Copper was low, so she was advised to start supplement.  B6 was still elevated despite stopping supplements 3 prior.  She was wondering if her neuropathy could be secondary to [employer] exposure. Her husband was a [place] veteran and reports cases of spouses exposed to [employer] through their husband's semen. 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 Her neuropathic symptoms started shortly after her return.    II Migraine: Onset: Since 1970, after her twin newborns passed away. She has lived with life-long family-related stress Location: Migraines are unilateral/parietal or bilateral retro-orbital, chronic tension type (bifrontal) Quality: Severe crushing Initial Intensity: 10/10 Aura: no Prodrome: no Associated symptoms: Photophobia, phonophobia. No nausea, vomiting or visual disturbance Initial Duration: Migraines 1 hour with sumatriptan, other headaches 15 minutes with Fioricet Initial Frequency: Daily (3 to 4 days a month are severe migraine) Triggers/exacerbating factors: Stress, Nexium Relieving factors: Clonazepam, Fioricet, sumatriptan Activity: Cannot function when severe (3-4 days per month)  Past NSAIDS: Ibuprofen, naproxen Past analgesics: Cafergot, Excedrin, Tylenol, Tramadol, Fioricet Past abortive triptans: no Past muscle relaxants: tizanidine Past anti-emetic: Zofran ODT 53m Past antihypertensive medications: Lopressor, propranolol Past antidepressant medications: Elavil, Effexor, Zoloft Past anticonvulsant medications: Topiramate, gabapentin 3058mtwice daily Past vitamins/Herbal/Supplements: CoQ10 Other past therapies: yoga  Family history of headache: Father, sister, brother  MRI and MRA of brain from 07/23/11 were personally reviewed and unremarkable.   Past Medical History: Past Medical History:  Diagnosis Date   Allergy    Anal fissure 10/08/2016    Anemia    anemia in the past   Angina    takes Propanolol prn and it stops her chest   Anxiety    Barrett esophagus    not consistently present   Cataract    Chronic kidney disease    kidney stones and UTI   Complication of anesthesia    either  BP or pulse dropped with last surgery   DDD (degenerative disc disease)    cervical and lumbar   Dementia (HCC)    Depression    post traumatic stress  disorder   [place]   Diabetes mellitus    sugar goes up and down diet controlled   Dysrhythmia    brought on by stress   External hemorrhoids    Fatigue    Fibromyalgia    neuropathy knees ankles and toes, bladder   GERD (gastroesophageal reflux disease)    H/O hyperparathyroidism    Headache(784.0)    migraines   Hiatal hernia    History of kidney stones    Hypertension    3 small brain aneurysms   Hypoglycemia 11/08/2016   IBS (irritable bowel syndrome)    Internal hemorrhoids    Macular degeneration    Migraines    Mitral regurgitation 12/11/2015   Osteoarthritis    all over   Osteopetrosis    Peripheral vascular disease ([place]   superficial phlebitis in left calf   Personal history of colonic polyps 07/22/2013   07/2013 - 3 small adenomas - repeat colonoscopy 2018   Small intestinal bacterial overgrowth 08/05/2017   + lactulose breath test 06/2017 Xifaxan Rx    Medications: Outpatient Encounter Medications as of 07/01/2018  Medication Sig Note   acarbose (PRECOSE) 25 MG tablet TAKE 1  TABLET BY MOUTH 3 TIMES DAILY WITH MEALS.    aspirin-acetaminophen-caffeine (EXCEDRIN MIGRAINE) 250-250-65 MG tablet Take 2 tablets by mouth 2 (two) times daily.    clonazePAM (KLONOPIN) 1 MG tablet Take 0.5-1 tablets (0.5-1 mg total) by mouth 2 (two) times daily as needed for anxiety.    Continuous Blood Gluc Sensor (FREESTYLE LIBRE 14 DAY SENSOR) MISC 1 each by Does not apply route See admin instructions. Use with continuous glucose  monitor device. Apply to skin every 14 days.    cyclobenzaprine (FLEXERIL) 10 MG tablet Take 1 tablet (10 mg total) by mouth 3 (three) times daily as needed for muscle spasms.    cycloSPORINE (RESTASIS) 0.05 % ophthalmic emulsion Place 1 drop into both eyes 2 (two) times daily.    diclofenac sodium (VOLTAREN) 1 % GEL Apply 2 g topically 4 (four) times daily.    dicyclomine (BENTYL) 10 MG capsule Take 1-2 tab 3 times daily AC as needed for spasms and cramping.    diltiazem 2 % GEL Apply 1 application topically 2 (two) times daily as needed (pain from fissure).    docusate sodium (COLACE) 250 MG capsule Take 1 capsule (250 mg total) by mouth daily.    DULoxetine (CYMBALTA) 30 MG capsule Take 3 capsules (90 mg total) by mouth daily.    Erenumab-aooe (AIMOVIG) 70 MG/ML SOAJ Inject 70 mg into the skin every 30 (thirty) days.    Erenumab-aooe (AIMOVIG) 70 MG/ML SOAJ Inject 70 mg into the skin every 30 (thirty) days.    esomeprazole (NEXIUM) 40 MG capsule Take 1 capsule (40 mg total) by mouth daily before breakfast.    famotidine (PEPCID) 40 MG tablet Take 1 tablet (40 mg total) by mouth at bedtime.    fentaNYL (DURAGESIC - DOSED MCG/HR) 50 MCG/HR Place 50 mcg onto the skin every 3 (three) days. 03/19/2017: Per pt taking 26mg   furosemide (LASIX) 40 MG tablet Take 1 tablet (40 mg total) by mouth daily as needed for edema.    HYDROcodone-acetaminophen (NORCO) 7.5-325 MG tablet Take 1 tablet every 6 (six) hours as needed by mouth for moderate pain.    hypromellose (SYSTANE OVERNIGHT THERAPY) 0.3 % GEL ophthalmic ointment Place 1 drop into both eyes at bedtime. Reported on 02/11/2015    IRON PO Take by mouth.    L-Methylfolate-Algae-B12-B6 (METANX PO) Take 2 mg by mouth every 12 (twelve) hours.    lidocaine (LIDODERM) 5 % Place 1 patch onto the skin as needed. Remove & Discard patch within 12 hours. May dispense as 3 month supply    Lifitegrast (XIIDRA) 5 % SOLN Place 1 drop into both eyes  daily.     lubiprostone (AMITIZA) 24 MCG capsule Take 1 capsule (24 mcg total) by mouth 2 (two) times daily with a meal.    meloxicam (MOBIC) 15 MG tablet Take 1 tablet (15 mg total) by mouth daily. Prn pain    metroNIDAZOLE (METROGEL) 1 % gel Apply topically daily.    mirabegron ER (MYRBETRIQ) 25 MG TB24 tablet Take 1 tablet (25 mg total) by mouth daily.    mupirocin ointment (BACTROBAN) 2 % Apply 1 application topically 3 (three) times daily.    naloxegol oxalate (MOVANTIK) 25 MG TABS tablet Take 25 mg by mouth daily.    Naloxone HCl 0.4 MG/0.4ML SOAJ Administer 0.459msc at first sign of opioid overdose and repeat every 2 minutes as needed for resuscitation. Call 911 immediately    Nerve Stimulator (CEFALY KIT) DEVI 20 Minutes by Does not  apply route as directed.    NON FORMULARY [facility]  Peripheral Neuropathy Cream- Bupivacaine 1%, Doxepin 3%, Gabapentin 6%, Pentoxifylline 3%, Topiramate 1% Apply 1-2 grams to affected area 3-4 times daily Qty. 120 gm 3 refills    ondansetron (ZOFRAN) 8 MG tablet Take 1 tablet (8 mg total) by mouth every 8 (eight) hours as needed for nausea or vomiting.    ONE TOUCH LANCETS MISC Use to check cbgs 6 times daily as directed by physician. Testing freq: hypoglycemia, hyperglycemia. ICD10: R73.03, E 16.2 03/06/2017: use   potassium chloride SA (K-DUR,KLOR-CON) 20 MEQ tablet Take 1 tablet (20 mEq total) by mouth daily as needed (only take along with the furosemide).    pregabalin (LYRICA) 50 MG capsule Take 1 capsule (50 mg total) by mouth 3 (three) times daily. (Patient taking differently: Take 50 mg by mouth 2 (two) times daily. )    pregabalin (LYRICA) 50 MG capsule Take 1 capsule (50 mg total) by mouth 3 (three) times daily.    Probiotic Product (PROBIOTIC-10 PO) Take by mouth.     SUMAtriptan (IMITREX) 100 MG tablet Take 1 tablet at earliest onset of headache, may repeat in 2 hours if headache persists or reoccurs.    tizanidine  (ZANAFLEX) 2 MG capsule TAKE 1 CAPSULE (2 MG TOTAL) BY MOUTH 3 (THREE) TIMES DAILY.    traZODone (DESYREL) 100 MG tablet Take 0.5-1 tablets (50-100 mg total) by mouth at bedtime as needed for sleep. And 1/2 -1 if wakes at 4am.    valACYclovir (VALTREX) 1000 MG tablet Take 1 tablet (1,000 mg total) by mouth 3 (three) times daily.    [DISCONTINUED] buPROPion (WELLBUTRIN) 75 MG tablet Take 75 mg by mouth daily after breakfast.     No facility-administered encounter medications on file as of 07/01/2018.     Allergies: Allergies  Allergen Reactions   Ambien [Zolpidem Tartrate]     Hallucinations   Codeine Anaphylaxis   Oxycodone-Acetaminophen Shortness Of Breath   Tylox [Oxycodone-Acetaminophen] Anaphylaxis    Chest pain   Darvocet [Propoxyphene N-Acetaminophen]     Chest pain   Darvon [Propoxyphene] Itching   Lorcet [Hydrocodone-Acetaminophen]     Chest pain   Opium     hallucinations   Vicodin [Hydrocodone-Acetaminophen] Other (See Comments)    Chest Pain    Wheat Bran    Amoxicillin Nausea And Vomiting    Reaction:  Migraine headache   Penicillins Nausea And Vomiting    Migraine Headaches    Family History: Family History  Problem Relation Age of Onset   Stroke Mother    Lung cancer Mother    Heart disease Mother    Diabetes Mother    Hypertension Father    Stroke Father    Lung cancer Father    Heart disease Father    Kidney disease Father    Seizures Father        epilepsy, and sisters x 2   Pancreatic cancer Sister    Lung cancer Sister    Colon cancer Maternal Aunt        12 relatives   Uterine cancer Other        aunt   Heart disease Other        grandmother   Clotting disorder Sister    Heart disease Sister        x 2   Kidney disease Sister        x 2   Breast cancer Sister    Colon cancer  Paternal Aunt    Colon cancer Paternal Aunt     Social History: Social History   Socioeconomic History   Marital status:  Married    Spouse name: Rushie Chestnut.   Number of children: 2   Years of education: Not on file   Highest education level: Not on file  Occupational History   Occupation: Disability    Employer: RETIRED  Social Designer, fashion/clothing strain: Not on file   Food insecurity    Worry: Not on file    Inability: Not on file   Transportation needs    Medical: Not on file    Non-medical: Not on file  Tobacco Use   Smoking status: Never Smoker   Smokeless tobacco: Never Used  Substance and Sexual Activity   Alcohol use: No   Drug use: No   Sexual activity: Not Currently  Lifestyle   Physical activity    Days per week: Not on file    Minutes per session: Not on file   Stress: Not on file  Relationships   Social connections    Talks on phone: Not on file    Gets together: Not on file    Attends religious service: Not on file    Active member of club or organization: Not on file    Attends meetings of clubs or organizations: Not on file    Relationship status: Not on file   Intimate partner violence    Fear of current or ex partner: Not on file    Emotionally abused: Not on file    Physically abused: Not on file    Forced sexual activity: Not on file  Other Topics Concern   Not on file  Social History Narrative   She retired on disability   Married   2 Children   Observations/Objective:   Height '5\' 5"'  (1.651 m), weight 98 lb (44.5 kg). No acute distress.  Alert and oriented.  Speech fluent and not dysarthric.  Language intact.  Face symmetric.    Assessment and Plan:   1.  Migraine without aura, without status migrainosus, not intractable 2.  Idiopathic peripheral neuropathy, painful    1.  For preventative management, we will increase Aimovig to 193m monthly to try and further reduce frequenc. 2.  Due to her age, I would rather that she avoid triptans but Excedrin ineffective.  For abortive therapy, she will try flurbiprofen.  If ineffective, she may  take sumatriptan for now 3.  Limit use of pain relievers to no more than 2 days out of week to prevent risk of rebound or medication-overuse headache. 4.  Keep headache diary 5. Lyrica and Cymbalta for neuropathic pain 6.  Exercise, hydration, caffeine cessation, sleep hygiene, monitor for and avoid triggers 7.  Consider:  magnesium citrate 4050mdaily, riboflavin 40047maily, and coenzyme Q10 100m56mree times daily 8. Always keep in mind that currently taking a hormone or birth control may be a possible trigger or aggravating factor for migraine. 9. Follow up 6 months.   Follow Up Instructions:    -I discussed the assessment and treatment plan with the patient. The patient was provided an opportunity to ask questions and all were answered. The patient agreed with the plan and demonstrated an understanding of the instructions.   The patient was advised to call back or seek an in-person evaluation if the symptoms worsen or if the condition fails to improve as anticipated.   AdamDudley Major

## 2018-06-30 NOTE — Telephone Encounter (Signed)
Pt states that she has started her anti-depressant  Again and it had made a big difference

## 2018-07-01 ENCOUNTER — Encounter: Payer: Self-pay | Admitting: Neurology

## 2018-07-01 ENCOUNTER — Other Ambulatory Visit: Payer: Self-pay

## 2018-07-01 ENCOUNTER — Telehealth (INDEPENDENT_AMBULATORY_CARE_PROVIDER_SITE_OTHER): Payer: Medicare Other | Admitting: Neurology

## 2018-07-01 VITALS — Ht 65.0 in | Wt 98.0 lb

## 2018-07-01 DIAGNOSIS — G609 Hereditary and idiopathic neuropathy, unspecified: Secondary | ICD-10-CM

## 2018-07-01 DIAGNOSIS — G43009 Migraine without aura, not intractable, without status migrainosus: Secondary | ICD-10-CM | POA: Diagnosis not present

## 2018-07-01 DIAGNOSIS — G894 Chronic pain syndrome: Secondary | ICD-10-CM

## 2018-07-01 MED ORDER — AIMOVIG 140 MG/ML ~~LOC~~ SOAJ: 140.0000 mg | SUBCUTANEOUS | 5 refills | Status: DC

## 2018-07-01 MED ORDER — FLURBIPROFEN 100 MG PO TABS: 100.0000 mg | ORAL_TABLET | Freq: Three times a day (TID) | ORAL | 3 refills | Status: DC | PRN

## 2018-07-03 DIAGNOSIS — M170 Bilateral primary osteoarthritis of knee: Secondary | ICD-10-CM | POA: Diagnosis not present

## 2018-07-03 DIAGNOSIS — M797 Fibromyalgia: Secondary | ICD-10-CM | POA: Diagnosis not present

## 2018-07-03 DIAGNOSIS — G629 Polyneuropathy, unspecified: Secondary | ICD-10-CM | POA: Diagnosis not present

## 2018-07-06 ENCOUNTER — Encounter: Payer: Self-pay | Admitting: Family Medicine

## 2018-07-06 ENCOUNTER — Other Ambulatory Visit: Payer: Self-pay

## 2018-07-06 ENCOUNTER — Ambulatory Visit (INDEPENDENT_AMBULATORY_CARE_PROVIDER_SITE_OTHER): Payer: Medicare Other | Admitting: Family Medicine

## 2018-07-06 VITALS — BP 104/64 | HR 91 | Temp 98.9°F | Resp 17 | Ht 65.0 in | Wt 107.4 lb

## 2018-07-06 DIAGNOSIS — G63 Polyneuropathy in diseases classified elsewhere: Secondary | ICD-10-CM

## 2018-07-06 DIAGNOSIS — R109 Unspecified abdominal pain: Secondary | ICD-10-CM

## 2018-07-06 DIAGNOSIS — E559 Vitamin D deficiency, unspecified: Secondary | ICD-10-CM

## 2018-07-06 DIAGNOSIS — K2100 Gastro-esophageal reflux disease with esophagitis, without bleeding: Secondary | ICD-10-CM

## 2018-07-06 DIAGNOSIS — Z1211 Encounter for screening for malignant neoplasm of colon: Secondary | ICD-10-CM

## 2018-07-06 DIAGNOSIS — E610 Copper deficiency: Secondary | ICD-10-CM

## 2018-07-06 DIAGNOSIS — E11649 Type 2 diabetes mellitus with hypoglycemia without coma: Secondary | ICD-10-CM

## 2018-07-06 DIAGNOSIS — Z9884 Bariatric surgery status: Secondary | ICD-10-CM | POA: Diagnosis not present

## 2018-07-06 DIAGNOSIS — G992 Myelopathy in diseases classified elsewhere: Secondary | ICD-10-CM | POA: Diagnosis not present

## 2018-07-06 LAB — POCT URINALYSIS DIP (MANUAL ENTRY)
Blood, UA: NEGATIVE
Glucose, UA: NEGATIVE mg/dL
Leukocytes, UA: NEGATIVE
Nitrite, UA: NEGATIVE
Protein Ur, POC: NEGATIVE mg/dL
Spec Grav, UA: 1.025 (ref 1.010–1.025)
Urobilinogen, UA: 0.2 E.U./dL
pH, UA: 5 (ref 5.0–8.0)

## 2018-07-06 NOTE — Patient Instructions (Addendum)
  You will be contacted for referral to GI for an office visit to establish care  If you have lab work done today you will be contacted with your lab results within the next 2 weeks.  If you have not heard from [place] then please contact us. The fastest way to get your results is to register for My Chart.   IF you received an x-ray today, you will receive an invoice from [facility]. Please contact [facility] at [phone] with questions or concerns regarding your invoice.   IF you received labwork today, you will receive an invoice from [facility]. Please contact [facility] at [phone] with questions or concerns regarding your invoice.   Our billing staff will not be able to assist you with questions regarding bills from these companies.  You will be contacted with the lab results as soon as they are available. The fastest way to get your results is to activate your My Chart account. Instructions are located on the last page of this paperwork. If you have not heard from [place] regarding the results in 2 weeks, please contact this office.

## 2018-07-06 NOTE — Progress Notes (Signed)
Established Patient Office Visit  Subjective:  Patient ID: Kayla [name], female    DOB: [birth year]  Age: 73 y.o. MRN: [id number]  CC:  Chief Complaint  Patient presents with  . Transitions Of Care  . Medication Refill    all meds listed, pt no longer taking lasix or potassium    HPI [name] presents for transition of care  She sees multiple specialists She has a specialist in Neurology, Pain Management, Endocrinology, Gastroenterology, Urology, Cardiology  Neuropathy Her neuropathy causes her to feel like her feet have frost bite or on fire. She has neuropathy in her hand, toes, ankles and feet. She states that she had a increased copper diet and had to cut down her b12 She states that her feet feels like sponges or bricks She states that she gets up with her husband and gets dressed in the morning.  She states that she would like testing for her vitamin D deficiency and vitamin B12 that was too high. She states that both are affecting her Neuropathy She states that she does not know why her neuropathy is so severe  Anxiety She goes to [facility] for anxiety but denies depression She states that she takes Cymbalta but when she tried to come off the cymbalta her neuropathy was worse She states that now she feels apathy while on the medication cymbalta She states that she is an empath but feels dead inside Depression screen [facility] 2/9 07/06/2018 03/11/2018 01/22/2018 12/29/2017 08/21/2017  Decreased Interest 0 0 0 0 0  Down, Depressed, Hopeless 0 0 0 0 0  PHQ - 2 Score 0 0 0 0 0  Altered sleeping - - - - -  Tired, decreased energy - - - - -  Change in appetite - - - - -  Feeling bad or failure about yourself  - - - - -  Trouble concentrating - - - - -  Moving slowly or fidgety/restless - - - - -  Suicidal thoughts - - - - -  PHQ-9 Score - - - - -  Difficult doing work/chores - - - - -  Some recent data might be hidden    Left side flank pain Pt has history of chronic  constipation She takes opiates But in the past month she noted pain in the left side lower abdomen and left back flank She denies hematuria The pain is intermittent pain that feels like a butcher knife sticking in that is 8/10 She is s/p kidney stone ablation on the right side   Past Medical History:  Diagnosis Date  . Allergy   . Anal fissure 10/08/2016  . Anemia    anemia in the past  . Angina    takes Propanolol prn and it stops her chest  . Anxiety   . Barrett esophagus    not consistently present  . Cataract   . Chronic kidney disease    kidney stones and UTI  . Complication of anesthesia    either  BP or pulse dropped with last surgery  . DDD (degenerative disc disease)    cervical and lumbar  . Dementia ([place])   . Depression    post traumatic stress  disorder  . Diabetes ([place]) 11/18/2016  . Diabetes mellitus    sugar goes up and down diet controlled  . Dysrhythmia    brought on by stress  . External hemorrhoids   . Fatigue   . Fibromyalgia    neuropathy knees ankles and  toes, bladder  . GERD (gastroesophageal reflux disease)   . H/O hyperparathyroidism   . Headache(784.0)    migraines  . Hiatal hernia   . History of kidney stones   . Hypertension    3 small brain aneurysms  . Hypoglycemia 11/08/2016  . IBS (irritable bowel syndrome)   . Internal hemorrhoids   . Macular degeneration   . Migraines   . Mitral regurgitation 12/11/2015  . Osteoarthritis    all over  . Osteopetrosis   . Peripheral vascular disease (HCC)    superficial phlebitis in left calf  . Personal history of colonic polyps 07/22/2013   07/2013 - 3 small adenomas - repeat colonoscopy 2018  . Small intestinal bacterial overgrowth 08/05/2017   + lactulose breath test 06/2017 Xifaxan Rx    Past Surgical History:  Procedure Laterality Date  . ABDOMINAL HYSTERECTOMY    . APPENDECTOMY    . CARDIAC CATHETERIZATION  03/22/1991   EF 61%  . CARDIOVASCULAR STRESS TEST  10/03/2006  .  CHOLECYSTECTOMY    . COLONOSCOPY  06/23/2008   normal  . DILATION AND CURETTAGE OF UTERUS     x2  . ESOPHAGUS SURGERY    . EXPLORATORY LAPAROTOMY  1993  . EYE SURGERY    . GASTRIC BYPASS  1999  . GASTRIC RESTRICTION SURGERY  1991  . LASIK Bilateral   . Right Arm Surgery    . Right Knee Arthroscopy    . Rt wrist fx  2009  . stomach stappeling  1991  . STONE EXTRACTION WITH BASKET  2012  . TONSILLECTOMY    . TUBAL LIGATION    . UPPER GASTROINTESTINAL ENDOSCOPY  06/23/2008   w/Dil, Barrett's esophagus  . US ECHOCARDIOGRAPHY  01/21/2007   EF 55-60%  . US ECHOCARDIOGRAPHY  08/30/2003   EF 55-60%    Family History  Problem Relation Age of Onset  . Stroke Mother   . Lung cancer Mother   . Heart disease Mother   . Diabetes Mother   . Hypertension Father   . Stroke Father   . Lung cancer Father   . Heart disease Father   . Kidney disease Father   . Seizures Father        epilepsy, and sisters x 2  . Pancreatic cancer Sister   . Lung cancer Sister   . Colon cancer Maternal Aunt        12 relatives  . Uterine cancer Other        aunt  . Heart disease Other        grandmother  . Clotting disorder Sister   . Heart disease Sister        x 2  . Kidney disease Sister        x 2  . Breast cancer Sister   . Colon cancer Paternal Aunt   . Colon cancer Paternal Aunt     Social History   Socioeconomic History  . Marital status: Married    Spouse name: Rushie Chestnut.  . Number of children: 2  . Years of education: Not on file  . Highest education level: Not on file  Occupational History  . Occupation: Disability    Employer: RETIRED  Social Needs  . Financial resource strain: Not on file  . Food insecurity    Worry: Not on file    Inability: Not on file  . Transportation needs    Medical: Not on file    Non-medical: Not on file  Tobacco  Use  . Smoking status: Never Smoker  . Smokeless tobacco: Never Used  Substance and Sexual Activity  . Alcohol use: No  . Drug use: No   . Sexual activity: Not Currently  Lifestyle  . Physical activity    Days per week: Not on file    Minutes per session: Not on file  . Stress: Not on file  Relationships  . Social Herbalist on phone: Not on file    Gets together: Not on file    Attends religious service: Not on file    Active member of club or organization: Not on file    Attends meetings of clubs or organizations: Not on file    Relationship status: Not on file  . Intimate partner violence    Fear of current or ex partner: Not on file    Emotionally abused: Not on file    Physically abused: Not on file    Forced sexual activity: Not on file  Other Topics Concern  . Not on file  Social History Narrative   She retired on disability   Married   2 Children    Outpatient Medications Prior to Visit  Medication Sig Dispense Refill  . acarbose (PRECOSE) 25 MG tablet TAKE 1 TABLET BY MOUTH 3 TIMES DAILY WITH MEALS. 270 tablet 3  . aspirin-acetaminophen-caffeine (EXCEDRIN MIGRAINE) [id number] MG tablet Take 2 tablets by mouth 2 (two) times daily.    . clonazePAM (KLONOPIN) 1 MG tablet Take 0.5-1 tablets (0.5-1 mg total) by mouth 2 (two) times daily as needed for anxiety. 180 tablet 0  . Continuous Blood Gluc Sensor (FREESTYLE LIBRE 14 DAY SENSOR) MISC 1 each by Does not apply route See admin instructions. Use with continuous glucose monitor device. Apply to skin every 14 days. 3 each 2  . cyclobenzaprine (FLEXERIL) 10 MG tablet Take 1 tablet (10 mg total) by mouth 3 (three) times daily as needed for muscle spasms. 270 tablet 1  . cycloSPORINE (RESTASIS) 0.05 % ophthalmic emulsion Place 1 drop into both eyes 2 (two) times daily. 5.5 mL 5  . diclofenac sodium (VOLTAREN) 1 % GEL Apply 2 g topically 4 (four) times daily. 700 g 3  . dicyclomine (BENTYL) 10 MG capsule Take 1-2 tab 3 times daily AC as needed for spasms and cramping. 270 capsule 1  . diltiazem 2 % GEL Apply 1 application topically 2 (two) times daily  as needed (pain from fissure). 30 g 2  . DULoxetine (CYMBALTA) 30 MG capsule Take 3 capsules (90 mg total) by mouth daily. 90 capsule 5  . Erenumab-aooe (AIMOVIG) 140 MG/ML SOAJ Inject 140 mg into the skin every 30 (thirty) days. 1 pen 5  . esomeprazole (NEXIUM) 40 MG capsule Take 1 capsule (40 mg total) by mouth daily before breakfast. 90 capsule 3  . famotidine (PEPCID) 40 MG tablet Take 1 tablet (40 mg total) by mouth at bedtime. 90 tablet 3  . fentaNYL (DURAGESIC - DOSED MCG/HR) 50 MCG/HR Place 50 mcg onto the skin every 3 (three) days.    . flurbiprofen (ANSAID) 100 MG tablet Take 1 tablet (100 mg total) by mouth every 8 (eight) hours as needed (maximum 3 tablets in 24 hours). 24 tablet 3  . HYDROcodone-acetaminophen (NORCO) 7.5-325 MG tablet Take 1 tablet every 6 (six) hours as needed by mouth for moderate pain.    . hypromellose (SYSTANE OVERNIGHT THERAPY) 0.3 % GEL ophthalmic ointment Place 1 drop into both eyes at bedtime. Reported on  02/11/2015    . lidocaine (LIDODERM) 5 % Place 1 patch onto the skin as needed. Remove & Discard patch within 12 hours. May dispense as 3 month supply 90 patch 3  . Lifitegrast (XIIDRA) 5 % SOLN Place 1 drop into both eyes daily.     . meloxicam (MOBIC) 15 MG tablet Take 1 tablet (15 mg total) by mouth daily. Prn pain 90 tablet 1  . metroNIDAZOLE (METROGEL) 1 % gel Apply topically daily. 45 g 0  . mirabegron ER (MYRBETRIQ) 25 MG TB24 tablet Take 1 tablet (25 mg total) by mouth daily. 90 tablet 1  . mupirocin ointment (BACTROBAN) 2 % Apply 1 application topically 3 (three) times daily. 30 g 1  . naloxegol oxalate (MOVANTIK) 25 MG TABS tablet Take 25 mg by mouth daily.    . Naloxone HCl 0.4 MG/0.4ML SOAJ Administer 0.98m [place] at first sign of opioid overdose and repeat every 2 minutes as needed for resuscitation. Call 911 immediately 5 Package 0  . NON FORMULARY [facility]  Peripheral Neuropathy Cream- Bupivacaine 1%, Doxepin 3%, Gabapentin 6%,  Pentoxifylline 3%, Topiramate 1% Apply 1-2 grams to affected area 3-4 times daily Qty. 120 gm 3 refills    . ondansetron (ZOFRAN) 8 MG tablet Take 1 tablet (8 mg total) by mouth every 8 (eight) hours as needed for nausea or vomiting. 60 tablet 0  . ONE TOUCH LANCETS MISC Use to check cbgs 6 times daily as directed by physician. Testing freq: hypoglycemia, hyperglycemia. ICD10: R73.03, E 16.2 600 each 5  . pregabalin (LYRICA) 50 MG capsule Take 1 capsule (50 mg total) by mouth 3 (three) times daily. (Patient taking differently: Take 50 mg by mouth 2 (two) times daily. ) 90 capsule 3  . pregabalin (LYRICA) 50 MG capsule Take 1 capsule (50 mg total) by mouth 3 (three) times daily. 270 capsule 3  . Probiotic Product (PROBIOTIC-10 PO) Take by mouth.     . SUMAtriptan (IMITREX) 100 MG tablet Take 1 tablet at earliest onset of headache, may repeat in 2 hours if headache persists or reoccurs. 30 tablet 1  . tizanidine (ZANAFLEX) 2 MG capsule TAKE 1 CAPSULE (2 MG TOTAL) BY MOUTH 3 (THREE) TIMES DAILY.    . traZODone (DESYREL) 100 MG tablet Take 0.5-1 tablets (50-100 mg total) by mouth at bedtime as needed for sleep. And 1/2 -1 if wakes at 4am. 90 tablet 1  . valACYclovir (VALTREX) 1000 MG tablet Take 1 tablet (1,000 mg total) by mouth 3 (three) times daily. 21 tablet 0  . furosemide (LASIX) 40 MG tablet Take 1 tablet (40 mg total) by mouth daily as needed for edema. (Patient not taking: Reported on 07/06/2018) 90 tablet 0  . Nerve Stimulator (CEFALY KIT) DEVI 20 Minutes by Does not apply route as directed. (Patient not taking: Reported on 07/06/2018) 1 Device 0   No facility-administered medications prior to visit.     Allergies  Allergen Reactions  . Ambien [Zolpidem Tartrate]     Hallucinations  . Codeine Anaphylaxis  . Oxycodone-Acetaminophen Shortness Of Breath  . Tylox [Oxycodone-Acetaminophen] Anaphylaxis    Chest pain  . Darvocet [Propoxyphene N-Acetaminophen]     Chest pain  . Darvon  [Propoxyphene] Itching  . Lorcet [Hydrocodone-Acetaminophen]     Chest pain  . Opium     hallucinations  . Vicodin [Hydrocodone-Acetaminophen] Other (See Comments)    Chest Pain   . Wheat Bran   . Amoxicillin Nausea And Vomiting    Reaction:  Migraine  headache  . Penicillins Nausea And Vomiting    Migraine Headaches    ROS Review of Systems Review of Systems  See hpi    Objective:     BP 104/64 (BP Location: Left Arm, Patient Position: Sitting, Cuff Size: Normal)   Pulse 91   Temp 98.9 F (37.2 C) (Oral)   Resp 17   Ht 5' 5" (1.651 m)   Wt 107 lb 6.4 oz (48.7 kg)   SpO2 98%   BMI 17.87 kg/m  Wt Readings from Last 3 Encounters:  07/06/18 107 lb 6.4 oz (48.7 kg)  07/01/18 98 lb (44.5 kg)  03/11/18 107 lb (48.5 kg)    Physical Exam  Constitutional:  Thin, pale   HENT:  Head: Normocephalic and atraumatic.  Eyes: Conjunctivae and EOM are normal.  Neck: Normal range of motion. Neck supple. No thyromegaly present.  Cardiovascular: Normal rate, regular rhythm and normal heart sounds.  Pulmonary/Chest: Effort normal and breath sounds normal. No respiratory distress. She has no wheezes. She has no rales.  Abdominal: Soft. Bowel sounds are normal. She exhibits no distension. There is no abdominal tenderness. There is no rebound and no guarding.  Musculoskeletal: Normal range of motion.        General: No edema.  Psychiatric: Her behavior is normal. Judgment and thought content normal.  Flat affect       Health Maintenance Due  Topic Date Due  . COLONOSCOPY  03/06/2018    There are no preventive care reminders to display for this patient.  Lab Results  Component Value Date   TSH 1.780 03/06/2017   Lab Results  Component Value Date   WBC 6.6 01/22/2018   HGB 11.6 01/22/2018   HCT 34.7 01/22/2018   MCV 91 01/22/2018   PLT 262 01/22/2018   Lab Results  Component Value Date   NA 142 01/22/2018   K 4.4 01/22/2018   CO2 26 01/22/2018   GLUCOSE 81  01/22/2018   BUN 22 01/22/2018   CREATININE 0.57 01/22/2018   BILITOT 0.4 01/22/2018   ALKPHOS 77 01/22/2018   AST 31 01/22/2018   ALT 23 01/22/2018   PROT 5.9 (L) 01/22/2018   ALBUMIN 4.1 01/22/2018   CALCIUM 9.2 01/22/2018   ANIONGAP 11 09/12/2016   Lab Results  Component Value Date   CHOL 189 12/29/2017   Lab Results  Component Value Date   HDL 80 12/29/2017   Lab Results  Component Value Date   LDLCALC 92 12/29/2017   Lab Results  Component Value Date   TRIG 87 12/29/2017   Lab Results  Component Value Date   CHOLHDL 2.4 12/29/2017   Lab Results  Component Value Date   HGBA1C 5.1 01/22/2018      Assessment & Plan:   Problem List Items Addressed This Visit      Digestive   GERD  - follow up with new GI doctor for colonoscopy as well as discussion of prior endoscopies   Relevant Orders   Ambulatory referral to Gastroenterology   Vitamin B12   Vitamin B6     Endocrine   Diabetes ([place])  - continue with Endocrinology   Relevant Orders   CBC   CMP14+EGFR     Other   H/O gastric bypass  -  Will monitor   Relevant Orders   CBC   CMP14+EGFR    Other Visit Diagnoses    Screen for colon cancer    -  Primary   Relevant Orders  Ambulatory referral to Gastroenterology   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   Follow-up: No follow-ups on file.    Forrest Moron, MD

## 2018-07-08 DIAGNOSIS — M546 Pain in thoracic spine: Secondary | ICD-10-CM | POA: Diagnosis not present

## 2018-07-08 DIAGNOSIS — M549 Dorsalgia, unspecified: Secondary | ICD-10-CM | POA: Diagnosis not present

## 2018-07-08 DIAGNOSIS — Z9689 Presence of other specified functional implants: Secondary | ICD-10-CM | POA: Diagnosis not present

## 2018-07-08 LAB — CBC
Hematocrit: 35 % (ref 34.0–46.6)
Hemoglobin: 12.1 g/dL (ref 11.1–15.9)
MCH: 29.4 pg (ref 26.6–33.0)
MCHC: 34.6 g/dL (ref 31.5–35.7)
MCV: 85 fL (ref 79–97)
Platelets: 277 10*3/uL (ref 150–450)
RBC: 4.12 x10E6/uL (ref 3.77–5.28)
RDW: 13.9 % (ref 11.7–15.4)
WBC: 8.1 10*3/uL (ref 3.4–10.8)

## 2018-07-08 LAB — VITAMIN D 25 HYDROXY (VIT D DEFICIENCY, FRACTURES): Vit D, 25-Hydroxy: 8.8 ng/mL — ABNORMAL LOW (ref 30.0–100.0)

## 2018-07-08 LAB — CMP14+EGFR
ALT: 22 IU/L (ref 0–32)
AST: 26 IU/L (ref 0–40)
Albumin/Globulin Ratio: 2.6 — ABNORMAL HIGH (ref 1.2–2.2)
Albumin: 4.4 g/dL (ref 3.8–4.8)
Alkaline Phosphatase: 72 IU/L (ref 39–117)
BUN/Creatinine Ratio: 28 (ref 12–28)
BUN: 19 mg/dL (ref 8–27)
Bilirubin Total: 0.3 mg/dL (ref 0.0–1.2)
CO2: 25 mmol/L (ref 20–29)
Calcium: 9.5 mg/dL (ref 8.7–10.3)
Chloride: 105 mmol/L (ref 96–106)
Creatinine, Ser: 0.68 mg/dL (ref 0.57–1.00)
GFR calc Af Amer: 102 mL/min/{1.73_m2} (ref >59)
GFR calc non Af Amer: 89 mL/min/{1.73_m2} (ref >59)
Globulin, Total: 1.7 g/dL (ref 1.5–4.5)
Glucose: 78 mg/dL (ref 65–99)
Potassium: 4.9 mmol/L (ref 3.5–5.2)
Sodium: 143 mmol/L (ref 134–144)
Total Protein: 6.1 g/dL (ref 6.0–8.5)

## 2018-07-08 LAB — VITAMIN B12: Vitamin B-12: 583 pg/mL (ref 232–1245)

## 2018-07-08 LAB — VITAMIN B6: Vitamin B6: 8.2 ug/L (ref 2.0–32.8)

## 2018-07-10 ENCOUNTER — Other Ambulatory Visit: Payer: Self-pay | Admitting: Family Medicine

## 2018-07-10 MED ORDER — VITAMIN D (ERGOCALCIFEROL) 1.25 MG (50000 UNIT) PO CAPS: 50000.0000 [IU] | ORAL_CAPSULE | ORAL | 3 refills | Status: DC

## 2018-07-16 DIAGNOSIS — R110 Nausea: Secondary | ICD-10-CM | POA: Diagnosis not present

## 2018-07-16 DIAGNOSIS — M5489 Other dorsalgia: Secondary | ICD-10-CM | POA: Diagnosis not present

## 2018-07-16 DIAGNOSIS — R1111 Vomiting without nausea: Secondary | ICD-10-CM | POA: Diagnosis not present

## 2018-08-03 ENCOUNTER — Telehealth: Payer: Self-pay | Admitting: Endocrinology

## 2018-08-03 ENCOUNTER — Other Ambulatory Visit: Payer: Self-pay

## 2018-08-03 DIAGNOSIS — E119 Type 2 diabetes mellitus without complications: Secondary | ICD-10-CM

## 2018-08-03 MED ORDER — FREESTYLE LIBRE 14 DAY READER DEVI: 1.0000 | 0 refills | Status: DC

## 2018-08-03 MED ORDER — FREESTYLE TEST VI STRP: ORAL_STRIP | 0 refills | Status: DC

## 2018-08-03 MED ORDER — FREESTYLE LANCETS MISC: 0 refills | Status: DC

## 2018-08-03 MED ORDER — FREESTYLE FREEDOM LITE W/DEVICE KIT: 1.0000 | PACK | Freq: Four times a day (QID) | 0 refills | Status: DC

## 2018-08-03 MED ORDER — FREESTYLE LIBRE 14 DAY SENSOR MISC: 1.0000 | 0 refills | Status: DC

## 2018-08-03 NOTE — Telephone Encounter (Signed)
MEDICATION: FreeStyle Reader and Freestyle Meter(for Lancets) and Test Strips  PHARMACY:  [id number] - [place], [place]   IS THIS A 90 DAY SUPPLY :   IS PATIENT OUT OF MEDICATION: YES  IF NOT; HOW MUCH IS LEFT:   LAST APPOINTMENT DATE: @1 /29/2020  NEXT APPOINTMENT DATE:@Visit  date not found  DO WE HAVE YOUR PERMISSION TO LEAVE A DETAILED MESSAGE:  OTHER COMMENTS:    **Let patient know to contact pharmacy at the end of the day to make sure medication is ready. **  ** Please notify patient to allow 48-72 hours to process**  **Encourage patient to contact the pharmacy for refills or they can request refills through [facility]**

## 2018-08-03 NOTE — Telephone Encounter (Signed)
E-Prescribing Status: Receipt confirmed by pharmacy (08/03/2018 10:51 AM EDT)  At Dr. Cordelia Pen request, Rx's were sent for a 30 day supply to allow pt ample time to schedule a follow up appt.

## 2018-08-03 NOTE — Telephone Encounter (Signed)
Please refill x 1 F/u is due  

## 2018-08-03 NOTE — Telephone Encounter (Signed)
LOV 12/26/17. Per your note, pt was to schedule f/u in 3 months. Please advise how you would like to proceed

## 2018-08-05 ENCOUNTER — Other Ambulatory Visit: Payer: Self-pay

## 2018-08-05 ENCOUNTER — Ambulatory Visit
Admission: RE | Admit: 2018-08-05 | Discharge: 2018-08-05 | Disposition: A | Discharge status: Home / Self Care | Payer: Medicare Other | Source: Ambulatory Visit | Attending: Internal Medicine | Admitting: Internal Medicine

## 2018-08-05 DIAGNOSIS — Z1231 Encounter for screening mammogram for malignant neoplasm of breast: Secondary | ICD-10-CM

## 2018-08-12 ENCOUNTER — Telehealth: Payer: Self-pay | Admitting: Endocrinology

## 2018-08-12 DIAGNOSIS — E11649 Type 2 diabetes mellitus with hypoglycemia without coma: Secondary | ICD-10-CM

## 2018-08-12 NOTE — Telephone Encounter (Signed)
Ok.  you will receive a phone call, about a day and time for an appointment

## 2018-08-12 NOTE — Telephone Encounter (Signed)
Called pt to further discuss. Has agreed to be referred to [facility] for further discussion about her meter discrepancy.

## 2018-08-12 NOTE — Telephone Encounter (Signed)
Patient states there is a 30-40 point difference in her Freestyle and her meter. She would like to know how to get them calibrated.  Please Advise, Thanks

## 2018-08-12 NOTE — Addendum Note (Signed)
Addended by: Renato Shin on: 08/12/2018 01:13 PM   Modules accepted: Orders

## 2018-08-12 NOTE — Telephone Encounter (Signed)
This question is best for [facility].  Ok with pt to refer?

## 2018-08-12 NOTE — Telephone Encounter (Signed)
First question, does she need both? While this is an excessive discrepancy, results on various devices will vary. Secondly, depending on what you order, I will order calibration solution for her to use to calibrate her device.

## 2018-08-24 ENCOUNTER — Telehealth: Payer: Self-pay | Admitting: *Deleted

## 2018-08-24 NOTE — Telephone Encounter (Signed)
Schedule AWV.  

## 2018-08-25 ENCOUNTER — Other Ambulatory Visit: Payer: Self-pay | Admitting: Endocrinology

## 2018-08-25 DIAGNOSIS — H01001 Unspecified blepharitis right upper eyelid: Secondary | ICD-10-CM | POA: Diagnosis not present

## 2018-08-25 DIAGNOSIS — D23111 Other benign neoplasm of skin of right upper eyelid, including canthus: Secondary | ICD-10-CM | POA: Diagnosis not present

## 2018-08-25 DIAGNOSIS — H02052 Trichiasis without entropian right lower eyelid: Secondary | ICD-10-CM | POA: Diagnosis not present

## 2018-08-25 DIAGNOSIS — H01004 Unspecified blepharitis left upper eyelid: Secondary | ICD-10-CM | POA: Diagnosis not present

## 2018-08-25 DIAGNOSIS — H40023 Open angle with borderline findings, high risk, bilateral: Secondary | ICD-10-CM | POA: Diagnosis not present

## 2018-08-31 ENCOUNTER — Telehealth: Payer: Self-pay | Admitting: Nutrition

## 2018-08-31 ENCOUNTER — Telehealth: Payer: Self-pay | Admitting: *Deleted

## 2018-08-31 ENCOUNTER — Ambulatory Visit (INDEPENDENT_AMBULATORY_CARE_PROVIDER_SITE_OTHER): Payer: Medicare Other | Admitting: Family Medicine

## 2018-08-31 VITALS — BP 95/54 | Ht 65.0 in | Wt 100.0 lb

## 2018-08-31 DIAGNOSIS — M5137 Other intervertebral disc degeneration, lumbosacral region: Secondary | ICD-10-CM

## 2018-08-31 DIAGNOSIS — M5030 Other cervical disc degeneration, unspecified cervical region: Secondary | ICD-10-CM

## 2018-08-31 DIAGNOSIS — Z0000 Encounter for general adult medical examination without abnormal findings: Secondary | ICD-10-CM | POA: Diagnosis not present

## 2018-08-31 DIAGNOSIS — M810 Age-related osteoporosis without current pathological fracture: Secondary | ICD-10-CM

## 2018-08-31 DIAGNOSIS — L729 Follicular cyst of the skin and subcutaneous tissue, unspecified: Secondary | ICD-10-CM

## 2018-08-31 NOTE — Telephone Encounter (Signed)
Message left on machine to call me to schedule a remote visit.

## 2018-08-31 NOTE — Telephone Encounter (Signed)
Dr. Nolon Rod,  Dwyane Dee would like to know if she could see physical therapy again.  She had spinal cord stimulator placed in March.  Hasn't been able to do much exercise at home.    [facility] [address] [place], [place], [address] Phone: [phone]   Fax:  [phone]   She states she also wanted a referral to Dermatology for a cyst on her chin.  Stated she talked about this, I didn't not see any reference .

## 2018-08-31 NOTE — Progress Notes (Signed)
Presents today for [employer] Visit   Date of last exam: 07/06/2018  Interpreter used for this visit?   I connected with  [name] on 08/31/18 by a telephone  application and verified that I am speaking with the correct person using two identifiers.      Patient Care Team: Shawnee Knapp, MD as PCP - General (Family Medicine) Nahser, Wonda Cheng, MD as PCP - Cardiology (Cardiology) Rainwater, Willey Blade, PA-C as Physician Assistant (Physician Assistant) Felipa Eth, Reece Leader., MD as Referring Physician (Urology) Calvert Cantor, MD as Consulting Physician (Ophthalmology) Gatha Mayer, MD as Consulting Physician (Gastroenterology) Pieter Partridge, DO as Consulting Physician (Neurology) Bo Merino, MD as Consulting Physician (Rheumatology) Yaakov Guthrie, PsyD (Psychiatry) Richard Miu, DMD (Dentistry) Jola Baptist, [place] as Referring Physician (Chiropractic Medicine)   Other items to address today:   Seeing nutritionist to try to see how she gain weight sent by Endocrinologist  Discussed immunizations Discussed Eye/Dental Will call Gastro to set up appointments.    Unable to exercise do to neuropathy, would like to see if she can see physical therapist again. Would also like a referral to see dermatologist.  Message sent to Dr. Kaleen Mask to approve.      Other Screening: Last screening for diabetes: 01/22/2018 Last lipid screening: 12/29/2017  ADVANCE DIRECTIVES: Discussed: no On File: no Materials Provided: yes  Immunization status:  Immunization History  Administered Date(s) Administered  . Hepatitis B, adult 04/21/2017  . Influenza Split 10/02/2011  . Influenza Whole 08/21/2007  . Influenza, High Dose Seasonal PF 12/29/2017  . Influenza,inj,Quad PF,6+ Mos 12/15/2013, 11/23/2014, 09/27/2015, 10/04/2016  . Influenza-Unspecified 12/15/2013, 11/23/2014, 09/27/2015, 10/04/2016  . Pneumococcal Conjugate-13 09/05/2014  .  Pneumococcal Polysaccharide-23 10/02/2011, 02/08/2013  . Td 01/14/2006  . Tdap 01/22/2016     Health Maintenance Due  Topic Date Due  . COLONOSCOPY  03/06/2018  . HEMOGLOBIN A1C  07/23/2018  . INFLUENZA VACCINE  08/15/2018     Functional Status Survey: Is the patient deaf or have difficulty hearing?: No Does the patient have difficulty seeing, even when wearing glasses/contacts?: No Does the patient have difficulty concentrating, remembering, or making decisions?: No Does the patient have difficulty walking or climbing stairs?: Yes(has neuropathy in both feet has had surgery) Does the patient have difficulty dressing or bathing?: No Does the patient have difficulty doing errands alone such as visiting a doctor's office or shopping?: Yes(cannot drive due to nueropathy in legs ... march 6 th)   6CIT Screen 08/31/2018 10/04/2016  What Year? 0 points 0 points  What month? 0 points 0 points  What time? 0 points 0 points  Count back from 20 0 points 0 points  Months in reverse 0 points 0 points  Repeat phrase 0 points 0 points  Total Score 0 0        Clinical Support from 08/31/2018 in [facility] at [facility]  AUDIT-C Score  0       Home Environment:    Has wheelchair if needed Uses chair lift to get up stairs Uses a walker Had surgery March 6 spinal cord stimulator   Patient Active Problem List   Diagnosis Date Noted  . MVP (mitral valve prolapse) 12/17/2017  . Rosacea 11/02/2017  . Chronic pain syndrome 11/02/2017  . Small intestinal bacterial overgrowth 08/05/2017  . Orthostatic hypotension 11/20/2016  . Diabetes ([place]) 11/18/2016  . Hypoglycemia 11/08/2016  . Cervical facet joint syndrome 10/08/2016  . Anal fissure 10/08/2016  .  Peripheral vascular disease ([place]) 01/22/2016  . Prediabetes 01/22/2016  . H/O hyperparathyroidism 01/22/2016  . Mitral regurgitation 12/11/2015  . H/O gastric bypass 02/11/2014  . Chronic maxillary sinusitis 09/15/2013  . Personal  history of colonic polyps 07/22/2013  . HA (headache)-chronic/tension type 07/28/2012  . Osteoporosis 07/28/2012  . Nephrolithiasis 07/28/2012  . Fibromyalgia 07/26/2012  . Chronic interstitial cystitis 10/16/2011  . Chronic constipation 10/04/2010  . WEIGHT LOSS 11/27/2009  . PERSONAL HISTORY OF FAILED MODERATE SEDATION 06/01/2008  . Anxiety state 03/18/2007  . Reactive depression 03/18/2007  . GERD 03/18/2007  . Diaphragmatic hernia 03/18/2007  . Osteoarthritis 03/18/2007  . [place] DISEASE, CERVICAL 03/18/2007  . [place] DISEASE, LUMBAR 03/18/2007  . Myalgia and myositis 03/18/2007     Past Medical History:  Diagnosis Date  . Allergy   . Anal fissure 10/08/2016  . Anemia    anemia in the past  . Angina    takes Propanolol prn and it stops her chest  . Anxiety   . Barrett esophagus    not consistently present  . Cataract   . Chronic kidney disease    kidney stones and UTI  . Complication of anesthesia    either  BP or pulse dropped with last surgery  . DDD (degenerative disc disease)    cervical and lumbar  . Dementia ([place])   . Depression    post traumatic stress  disorder  . Diabetes ([place]) 11/18/2016  . Diabetes mellitus    sugar goes up and down diet controlled  . Dysrhythmia    brought on by stress  . External hemorrhoids   . Fatigue   . Fibromyalgia    neuropathy knees ankles and toes, bladder  . GERD (gastroesophageal reflux disease)   . H/O hyperparathyroidism   . Headache(784.0)    migraines  . Hiatal hernia   . History of kidney stones   . Hypertension    3 small brain aneurysms  . Hypoglycemia 11/08/2016  . IBS (irritable bowel syndrome)   . Internal hemorrhoids   . Macular degeneration   . Migraines   . Mitral regurgitation 12/11/2015  . Osteoarthritis    all over  . Osteopetrosis   . Peripheral vascular disease ([facility])    superficial phlebitis in left calf  . Personal history of colonic polyps 07/22/2013   07/2013 - 3 small adenomas - repeat  colonoscopy 2018  . Small intestinal bacterial overgrowth 08/05/2017   + lactulose breath test 06/2017 Xifaxan Rx     Past Surgical History:  Procedure Laterality Date  . ABDOMINAL HYSTERECTOMY    . APPENDECTOMY    . CARDIAC CATHETERIZATION  03/22/1991   EF 61%  . CARDIOVASCULAR STRESS TEST  10/03/2006  . CHOLECYSTECTOMY    . COLONOSCOPY  06/23/2008   normal  . DILATION AND CURETTAGE OF UTERUS     x2  . ESOPHAGUS SURGERY    . EXPLORATORY LAPAROTOMY  1993  . EYE SURGERY    . GASTRIC BYPASS  1999  . GASTRIC RESTRICTION SURGERY  1991  . LASIK Bilateral   . Right Arm Surgery    . Right Knee Arthroscopy    . Rt wrist fx  2009  . stomach stappeling  1991  . STONE EXTRACTION WITH BASKET  2012  . TONSILLECTOMY    . TUBAL LIGATION    . UPPER GASTROINTESTINAL ENDOSCOPY  06/23/2008   w/Dil, Barrett's esophagus  . US ECHOCARDIOGRAPHY  01/21/2007   EF 55-60%  . US ECHOCARDIOGRAPHY  08/30/2003  EF 55-60%     Family History  Problem Relation Age of Onset  . Stroke Mother   . Lung cancer Mother   . Heart disease Mother   . Diabetes Mother   . Hypertension Father   . Stroke Father   . Lung cancer Father   . Heart disease Father   . Kidney disease Father   . Seizures Father        epilepsy, and sisters x 2  . Pancreatic cancer Sister   . Lung cancer Sister   . Colon cancer Maternal Aunt        12 relatives  . Uterine cancer Other        aunt  . Heart disease Other        grandmother  . Clotting disorder Sister   . Heart disease Sister        x 2  . Kidney disease Sister        x 2  . Breast cancer Sister   . Colon cancer Paternal Aunt   . Colon cancer Paternal Aunt      Social History   Socioeconomic History  . Marital status: Married    Spouse name: Rushie Chestnut.  . Number of children: 2  . Years of education: Not on file  . Highest education level: Not on file  Occupational History  . Occupation: Disability    Employer: RETIRED  Social Needs  . Financial resource  strain: Not on file  . Food insecurity    Worry: Not on file    Inability: Not on file  . Transportation needs    Medical: Not on file    Non-medical: Not on file  Tobacco Use  . Smoking status: Never Smoker  . Smokeless tobacco: Never Used  Substance and Sexual Activity  . Alcohol use: No  . Drug use: No  . Sexual activity: Not Currently  Lifestyle  . Physical activity    Days per week: Not on file    Minutes per session: Not on file  . Stress: Not on file  Relationships  . Social Herbalist on phone: Not on file    Gets together: Not on file    Attends religious service: Not on file    Active member of club or organization: Not on file    Attends meetings of clubs or organizations: Not on file    Relationship status: Not on file  . Intimate partner violence    Fear of current or ex partner: Not on file    Emotionally abused: Not on file    Physically abused: Not on file    Forced sexual activity: Not on file  Other Topics Concern  . Not on file  Social History Narrative   She retired on disability   Married   2 Children     Allergies  Allergen Reactions  . Ambien [Zolpidem Tartrate]     Hallucinations  . Codeine Anaphylaxis  . Oxycodone-Acetaminophen Shortness Of Breath  . Tylox [Oxycodone-Acetaminophen] Anaphylaxis    Chest pain  . Darvocet [Propoxyphene N-Acetaminophen]     Chest pain  . Darvon [Propoxyphene] Itching  . Lorcet [Hydrocodone-Acetaminophen]     Chest pain  . Opium     hallucinations  . Vicodin [Hydrocodone-Acetaminophen] Other (See Comments)    Chest Pain   . Wheat Bran   . Amoxicillin Nausea And Vomiting    Reaction:  Migraine headache  . Penicillins Nausea And Vomiting  Migraine Headaches     Prior to Admission medications   Medication Sig Start Date End Date Taking? Authorizing Provider  acarbose (PRECOSE) 25 MG tablet TAKE 1 TABLET BY MOUTH 3 TIMES DAILY WITH MEALS. 02/11/18  Yes Renato Shin, MD   aspirin-acetaminophen-caffeine [facility] MIGRAINE) [phone] MG tablet Take 2 tablets by mouth 2 (two) times daily.   Yes [provider]  Blood Glucose Monitoring Suppl (FREESTYLE FREEDOM LITE) w/Device KIT 1 each by Does not apply route 4 (four) times daily. Use to monitor glucose levels 4 times daily; E11.9 08/03/18  Yes Renato Shin, MD  clonazePAM (KLONOPIN) 1 MG tablet Take 0.5-1 tablets (0.5-1 mg total) by mouth 2 (two) times daily as needed for anxiety. 02/13/18  Yes Shawnee Knapp, MD  Continuous Blood Gluc Receiver (FREESTYLE LIBRE 55 DAY READER) [place] 1 each by Does not apply route See admin instructions. Use with sensors to monitor glucose levels; E11.9 08/03/18  Yes Renato Shin, MD  Continuous Blood Gluc Receiver (FREESTYLE LIBRE READER) DEVI 1 Device once for 1 dose by Does not apply route. 12/01/16 08/03/19 Yes Renato Shin, MD  Continuous Blood Gluc Sensor (FREESTYLE LIBRE 14 DAY SENSOR) MISC 1 each by Does not apply route See admin instructions. Use with continuous glucose monitor device. Apply to skin every 14 days. 08/03/18  Yes Renato Shin, MD  cyclobenzaprine (FLEXERIL) 10 MG tablet Take 1 tablet (10 mg total) by mouth 3 (three) times daily as needed for muscle spasms. 03/29/17  Yes Shawnee Knapp, MD  cycloSPORINE (RESTASIS) 0.05 % ophthalmic emulsion Place 1 drop into both eyes 2 (two) times daily. 11/08/16  Yes Shawnee Knapp, MD  diclofenac sodium (VOLTAREN) 1 % GEL Apply 2 g topically 4 (four) times daily. 02/12/17  Yes Shawnee Knapp, MD  dicyclomine (BENTYL) 10 MG capsule Take 1-2 tab 3 times daily AC as needed for spasms and cramping. 11/08/16  Yes Shawnee Knapp, MD  diltiazem 2 % GEL Apply 1 application topically 2 (two) times daily as needed (pain from fissure). 10/05/16  Yes Shawnee Knapp, MD  DULoxetine (CYMBALTA) 30 MG capsule Take 3 capsules (90 mg total) by mouth daily. 10/22/17  Yes Jaffe, Adam R, DO  Erenumab-aooe (AIMOVIG) 140 MG/ML SOAJ Inject 140 mg into the skin every 30  (thirty) days. 07/01/18  Yes Tomi Likens, Adam R, DO  esomeprazole (NEXIUM) 40 MG capsule Take 1 capsule (40 mg total) by mouth daily before breakfast. 02/13/18  Yes Shawnee Knapp, MD  famotidine (PEPCID) 40 MG tablet Take 1 tablet (40 mg total) by mouth at bedtime. 12/01/17  Yes Gatha Mayer, MD  fentaNYL (DURAGESIC - DOSED MCG/HR) 50 MCG/HR Place 50 mcg onto the skin every 3 (three) days.   Yes [provider]  flurbiprofen (ANSAID) 100 MG tablet Take 1 tablet (100 mg total) by mouth every 8 (eight) hours as needed (maximum 3 tablets in 24 hours). 07/01/18  Yes Jaffe, Adam R, DO  furosemide (LASIX) 40 MG tablet Take 1 tablet (40 mg total) by mouth daily as needed for edema. 03/29/17  Yes Shawnee Knapp, MD  glucose blood (FREESTYLE TEST STRIPS) test strip Use to monitor glucose levels 4 times daily; E11.9 08/03/18  Yes Renato Shin, MD  HYDROcodone-acetaminophen (NORCO) 7.5-325 MG tablet Take 1 tablet every 6 (six) hours as needed by mouth for moderate pain.   Yes [provider]  hypromellose (SYSTANE OVERNIGHT THERAPY) 0.3 % GEL ophthalmic ointment Place 1 drop into both eyes at  bedtime. Reported on 02/11/2015   Yes [provider]  Lancets (FREESTYLE) lancets Use to monitor glucose levels 4 times daily; E11.9 08/03/18  Yes Renato Shin, MD  lidocaine (LIDODERM) 5 % Place 1 patch onto the skin as needed. Remove & Discard patch within 12 hours. May dispense as 3 month supply 11/08/16  Yes Shawnee Knapp, MD  Lifitegrast Shirley Friar) 5 % SOLN Place 1 drop into both eyes daily.    Yes [provider]  meloxicam (MOBIC) 15 MG tablet Take 1 tablet (15 mg total) by mouth daily. Prn pain 02/11/18  Yes Shawnee Knapp, MD  metroNIDAZOLE (METROGEL) 1 % gel Apply topically daily. 03/19/17  Yes Shawnee Knapp, MD  mirabegron ER (MYRBETRIQ) 25 MG TB24 tablet Take 1 tablet (25 mg total) by mouth daily. 02/11/18  Yes Shawnee Knapp, MD  mupirocin ointment (BACTROBAN) 2 % Apply 1 application topically 3  (three) times daily. 10/05/16  Yes Shawnee Knapp, MD  naloxegol oxalate (MOVANTIK) 25 MG TABS tablet Take 25 mg by mouth daily.   Yes [provider]  Naloxone HCl 0.4 MG/0.4ML SOAJ Administer 0.21m [place] at first sign of opioid overdose and repeat every 2 minutes as needed for resuscitation. Call 911 immediately 03/20/16  Yes SShawnee Knapp MD  Nerve Stimulator (CEFALY KIT) DEVI 20 Minutes by Does not apply route as directed. 04/11/17  Yes JPieter Partridge DO  NON FORMULARY [facility]  Peripheral Neuropathy Cream- Bupivacaine 1%, Doxepin 3%, Gabapentin 6%, Pentoxifylline 3%, Topiramate 1% Apply 1-2 grams to affected area 3-4 times daily Qty. 120 gm 3 refills   Yes [provider]  ondansetron (ZOFRAN) 8 MG tablet Take 1 tablet (8 mg total) by mouth every 8 (eight) hours as needed for nausea or vomiting. 02/13/18  Yes SShawnee Knapp MD  pregabalin (LYRICA) 50 MG capsule Take 1 capsule (50 mg total) by mouth 3 (three) times daily. Patient taking differently: Take 50 mg by mouth 2 (two) times daily.  12/26/16  Yes Jaffe, Adam R, DO  pregabalin (LYRICA) 50 MG capsule Take 1 capsule (50 mg total) by mouth 3 (three) times daily. 06/23/18  Yes Jaffe, Adam R, DO  Probiotic Product (PROBIOTIC-10 PO) Take by mouth.    Yes [provider]  SUMAtriptan (IMITREX) 100 MG tablet Take 1 tablet at earliest onset of headache, may repeat in 2 hours if headache persists or reoccurs. 06/23/18  Yes Jaffe, Adam R, DO  tizanidine (ZANAFLEX) 2 MG capsule TAKE 1 CAPSULE (2 MG TOTAL) BY MOUTH 3 (THREE) TIMES DAILY. 10/10/17  Yes [provider]  traZODone (DESYREL) 100 MG tablet Take 0.5-1 tablets (50-100 mg total) by mouth at bedtime as needed for sleep. And 1/2 -1 if wakes at 4am. 02/13/18  Yes SShawnee Knapp MD  valACYclovir (VALTREX) 1000 MG tablet Take 1 tablet (1,000 mg total) by mouth 3 (three) times daily. 03/19/17  Yes SShawnee Knapp MD  Vitamin D, Ergocalciferol, (DRISDOL) 1.25 MG (50000 UT) CAPS  capsule Take 1 capsule (50,000 Units total) by mouth every 7 (seven) days. 07/10/18  Yes SForrest Moron MD  buPROPion (WELLBUTRIN) 75 MG tablet Take 75 mg by mouth daily after breakfast.   04/10/11  [provider]     [facility] 08/31/2018 07/06/2018 03/11/2018 01/22/2018 12/29/2017  Decreased Interest 0 0 0 0 0  Down, Depressed, Hopeless 0 0 0 0 0  PHQ - 2 Score 0 0 0 0 0  Altered  sleeping - - - - -  Tired, decreased energy - - - - -  Change in appetite - - - - -  Feeling bad or failure about yourself  - - - - -  Trouble concentrating - - - - -  Moving slowly or fidgety/restless - - - - -  Suicidal thoughts - - - - -  PHQ-9 Score - - - - -  Difficult doing work/chores - - - - -  Some recent data might be hidden     Fall Risk  08/31/2018 07/06/2018 07/01/2018 03/11/2018 01/22/2018  Falls in the past year? 0 0 0 0 0  Comment - - - - -  Number falls in past yr: 0 0 - - -  Injury with Fall? 0 0 - 0 -  Risk Factor Category  - - - - -  Risk for fall due to : - - - - -  Risk for fall due to: Comment - - - - -  Follow up Falls evaluation completed;Education provided;Falls prevention discussed Falls evaluation completed - - -      PHYSICAL EXAM: BP (!) 95/54 Comment: PATIENT TOOK AT HOME STATES ALWAYS LOW  Ht '5\' 5"'  (1.651 m)   Wt 100 lb (45.4 kg) Comment: PER PATIENT  BMI 16.64 kg/m    Wt Readings from Last 3 Encounters:  08/31/18 100 lb (45.4 kg)  07/06/18 107 lb 6.4 oz (48.7 kg)  07/01/18 98 lb (44.5 kg)     No exam data present    Physical Exam   Education/Counseling provided regarding diet and exercise, prevention of chronic diseases, smoking/tobacco cessation, if applicable, and reviewed "Covered Medicare Preventive Services."

## 2018-09-01 ENCOUNTER — Ambulatory Visit: Payer: Medicare Other | Admitting: Nutrition

## 2018-09-04 NOTE — Addendum Note (Signed)
Addended by: Delia Chimes A on: 09/04/2018 04:42 PM   Modules accepted: Orders

## 2018-09-04 NOTE — Telephone Encounter (Signed)
Referrals have been placed. Please notify the patient.

## 2018-09-07 NOTE — Telephone Encounter (Signed)
Spoke with pt an advised PT and dermatology referral placed for her.  Pt appreciative. Dgaddy, CMA

## 2018-09-10 ENCOUNTER — Ambulatory Visit (INDEPENDENT_AMBULATORY_CARE_PROVIDER_SITE_OTHER): Payer: Medicare Other | Admitting: Psychiatry

## 2018-09-10 DIAGNOSIS — F331 Major depressive disorder, recurrent, moderate: Secondary | ICD-10-CM

## 2018-09-15 ENCOUNTER — Encounter: Payer: Medicare Other | Attending: Endocrinology | Admitting: Nutrition

## 2018-09-15 ENCOUNTER — Other Ambulatory Visit: Payer: Self-pay

## 2018-09-15 DIAGNOSIS — E11649 Type 2 diabetes mellitus with hypoglycemia without coma: Secondary | ICD-10-CM | POA: Insufficient documentation

## 2018-09-15 DIAGNOSIS — E162 Hypoglycemia, unspecified: Secondary | ICD-10-CM

## 2018-09-16 NOTE — Progress Notes (Signed)
Patient reports that her Libre readings are 20-35 points different that her blood sugar readings..  I explained the difference between sensor readings and blood sugar readings, and she reported good understanding of this.  She reports frequent lows and very high readings (in the 200s after meals).  Explained what is happening in her body when blood sugars go high after a meal-resulting in low blood sugar readings, and we discussed ways she can slow the blood sugar rise after meals by adding fiber and some fat.  Says husband cooks her meals, and uses "a lot of fat"--having bacon and egg every morning with abdominal pain after this meals.  Suggested that too much fat raises blood sugar which can cause resulting lows when her pancrease over reacts (irritable) 2-3 hours after the meal   Suggested ways to tell him to reduce the fat with this meal.   She has had gastric bypass and limits food quantities,   Discussed other options for meals, and use if Boost or glucerna between meals in 2 ounce portions to help with "much needed" weight gain

## 2018-09-16 NOTE — Patient Instructions (Signed)
Limit fat at each meal to no more than 5 grams.   Eat food high in fiber--fruits and vegatables No sweet drinks, fruit juices or cold cereal and milk.

## 2018-09-17 DIAGNOSIS — G894 Chronic pain syndrome: Secondary | ICD-10-CM | POA: Diagnosis not present

## 2018-09-17 DIAGNOSIS — M47812 Spondylosis without myelopathy or radiculopathy, cervical region: Secondary | ICD-10-CM | POA: Diagnosis not present

## 2018-09-17 DIAGNOSIS — M792 Neuralgia and neuritis, unspecified: Secondary | ICD-10-CM | POA: Diagnosis not present

## 2018-09-17 DIAGNOSIS — G629 Polyneuropathy, unspecified: Secondary | ICD-10-CM | POA: Diagnosis not present

## 2018-10-01 ENCOUNTER — Ambulatory Visit (INDEPENDENT_AMBULATORY_CARE_PROVIDER_SITE_OTHER)
Admission: RE | Admit: 2018-10-01 | Discharge: 2018-10-01 | Disposition: A | Discharge status: Home / Self Care | Payer: Medicare Other | Source: Ambulatory Visit | Attending: Internal Medicine | Admitting: Internal Medicine

## 2018-10-01 ENCOUNTER — Ambulatory Visit (INDEPENDENT_AMBULATORY_CARE_PROVIDER_SITE_OTHER): Payer: Medicare Other | Admitting: Internal Medicine

## 2018-10-01 ENCOUNTER — Other Ambulatory Visit: Payer: Self-pay

## 2018-10-01 ENCOUNTER — Encounter: Payer: Self-pay | Admitting: Internal Medicine

## 2018-10-01 VITALS — BP 96/54 | HR 63 | Temp 97.4°F | Ht 65.0 in | Wt 105.0 lb

## 2018-10-01 DIAGNOSIS — R1310 Dysphagia, unspecified: Secondary | ICD-10-CM

## 2018-10-01 DIAGNOSIS — K5909 Other constipation: Secondary | ICD-10-CM

## 2018-10-01 DIAGNOSIS — F1120 Opioid dependence, uncomplicated: Secondary | ICD-10-CM

## 2018-10-01 DIAGNOSIS — R1319 Other dysphagia: Secondary | ICD-10-CM

## 2018-10-01 DIAGNOSIS — K581 Irritable bowel syndrome with constipation: Secondary | ICD-10-CM

## 2018-10-01 DIAGNOSIS — K6389 Other specified diseases of intestine: Secondary | ICD-10-CM

## 2018-10-01 DIAGNOSIS — R1012 Left upper quadrant pain: Secondary | ICD-10-CM | POA: Diagnosis not present

## 2018-10-01 MED ORDER — RIFAXIMIN 550 MG PO TABS: 550.0000 mg | ORAL_TABLET | Freq: Three times a day (TID) | ORAL | 0 refills | Status: AC

## 2018-10-01 MED ORDER — RIFAXIMIN 550 MG PO TABS: 550.0000 mg | ORAL_TABLET | Freq: Three times a day (TID) | ORAL | 0 refills | Status: DC

## 2018-10-01 NOTE — Progress Notes (Signed)
Kayla [name] 73 y.o. 05-24-1945 [id number]  Assessment & Plan:  Small intestinal bacterial overgrowth Repeat Xifaxan  Esophageal dysphagia Evaluate and treat with upper GI endoscopy.  Perhaps there is a subtle stricture problem not appreciated by last year's upper GI series.  The frequency and quality of her symptoms seem worse.The risks and benefits as well as alternatives of endoscopic procedure(s) have been discussed and reviewed. All questions answered. The patient agrees to proceed.  Her chronic narcotic use may be contributing as well as this is a known but underrecognized problem with dysphagia of narcotics contribute to that.  Often cause esophagogastric outflow obstruction.  Chronic constipation Seems to be multifactorial in origin.  Outlet dysfunction does not seem to be a predominant factor, could be contribution of small intestinal bacterial overgrowth, certainly her medications including her chronic narcotic use to her having a role.  I did do a 2 view abdomen which showed a fair amount of stool so I prescribed a MiraLAX purge for her.  We will see what this does and treatment of SIBO again  Chronic narcotic dependence (HCC) Chronic pain medication may be contributing to her dysphagia and constipation both likely is  I appreciate the opportunity to care for this patient. CC: Kayla Moron, MD     Subjective:   Chief Complaint: Dysphagia constipation  HPI Kayla [name] is having increasing problems with dysphagia.  Solids and liquids, sometimes there is midsternal impact and she has to regurgitate.  She has had this off and on through the years last EGD 2017 without etiology.  Last year I had her do an upper GI series which showed postsurgical gastrojejunostomy anatomy with prominent spontaneous reflux no stricture hiatal hernia or dysmotility.  I had started Amitiza then for constipation she did not answer my my chart message at that time.  That fell off the list she is  now on Movantik and is still having a lot of problems with constipation and some left lower quadrant pain.  She thinks she is due for colonoscopy but she is up-to-date with that having had 1 in 2017 [facility] [place].  She was treated for small intestinal bacterial overgrowth last year as well after a positive test.  She feels like she is back to where she was before taking Xifaxan significant stress with her husband's health in the interim though he is improved she strained her back helping him at one point.  She is following appropriate restrictions with the pandemic. Wt Readings from Last 3 Encounters:  10/01/18 105 lb (47.6 kg)  08/31/18 100 lb (45.4 kg)  07/06/18 107 lb 6.4 oz (48.7 kg)     Allergies  Allergen Reactions   Ambien [Zolpidem Tartrate]     Hallucinations   Codeine Anaphylaxis   Oxycodone-Acetaminophen Shortness Of Breath   Tylox [Oxycodone-Acetaminophen] Anaphylaxis    Chest pain   Darvocet [Propoxyphene N-Acetaminophen]     Chest pain   Darvon [Propoxyphene] Itching   Lorcet [Hydrocodone-Acetaminophen]     Chest pain   Opium     hallucinations   Vicodin [Hydrocodone-Acetaminophen] Other (See Comments)    Chest Pain    Wheat Bran    Amoxicillin Nausea And Vomiting    Reaction:  Migraine headache   Penicillins Nausea And Vomiting    Migraine Headaches   Current Meds  Medication Sig   acarbose (PRECOSE) 25 MG tablet TAKE 1 TABLET BY MOUTH 3 TIMES DAILY WITH MEALS.   aspirin-acetaminophen-caffeine ([place]) 250-250-65 MG  tablet Take 2 tablets by mouth 2 (two) times daily.   Blood Glucose Monitoring Suppl (FREESTYLE FREEDOM LITE) w/Device KIT 1 each by Does not apply route 4 (four) times daily. Use to monitor glucose levels 4 times daily; E11.9   clonazePAM (KLONOPIN) 1 MG tablet Take 0.5-1 tablets (0.5-1 mg total) by mouth 2 (two) times daily as needed for anxiety.   Continuous Blood Gluc Receiver  (FREESTYLE LIBRE 14 DAY READER) DEVI 1 each by Does not apply route See admin instructions. Use with sensors to monitor glucose levels; E11.9   Continuous Blood Gluc Sensor (FREESTYLE LIBRE 14 DAY SENSOR) MISC 1 each by Does not apply route See admin instructions. Use with continuous glucose monitor device. Apply to skin every 14 days.   cyclobenzaprine (FLEXERIL) 10 MG tablet Take 1 tablet (10 mg total) by mouth 3 (three) times daily as needed for muscle spasms.   cycloSPORINE (RESTASIS) 0.05 % ophthalmic emulsion Place 1 drop into both eyes 2 (two) times daily.   diclofenac sodium (VOLTAREN) 1 % GEL Apply 2 g topically 4 (four) times daily.   dicyclomine (BENTYL) 10 MG capsule Take 1-2 tab 3 times daily AC as needed for spasms and cramping.   diltiazem 2 % GEL Apply 1 application topically 2 (two) times daily as needed (pain from fissure).   DULoxetine (CYMBALTA) 30 MG capsule Take 3 capsules (90 mg total) by mouth daily.   Erenumab-aooe (AIMOVIG) 140 MG/ML SOAJ Inject 140 mg into the skin every 30 (thirty) days.   esomeprazole (NEXIUM) 40 MG capsule Take 1 capsule (40 mg total) by mouth daily before breakfast.   famotidine (PEPCID) 40 MG tablet Take 1 tablet (40 mg total) by mouth at bedtime.   fentaNYL (DURAGESIC - DOSED MCG/HR) 50 MCG/HR Place 50 mcg onto the skin every 3 (three) days.   flurbiprofen (ANSAID) 100 MG tablet Take 1 tablet (100 mg total) by mouth every 8 (eight) hours as needed (maximum 3 tablets in 24 hours).   furosemide (LASIX) 40 MG tablet Take 1 tablet (40 mg total) by mouth daily as needed for edema.   glucose blood (FREESTYLE TEST STRIPS) test strip Use to monitor glucose levels 4 times daily; E11.9   HYDROcodone-acetaminophen (NORCO) 7.5-325 MG tablet Take 1 tablet every 6 (six) hours as needed by mouth for moderate pain.   hypromellose (SYSTANE OVERNIGHT THERAPY) 0.3 % GEL ophthalmic ointment Place 1 drop into both eyes at bedtime. Reported on 02/11/2015    Lancets (FREESTYLE) lancets Use to monitor glucose levels 4 times daily; E11.9   lidocaine (LIDODERM) 5 % Place 1 patch onto the skin as needed. Remove & Discard patch within 12 hours. May dispense as 3 month supply   Lifitegrast (XIIDRA) 5 % SOLN Place 1 drop into both eyes daily.    meloxicam (MOBIC) 15 MG tablet Take 1 tablet (15 mg total) by mouth daily. Prn pain   metroNIDAZOLE (METROGEL) 1 % gel Apply topically daily.   mirabegron ER (MYRBETRIQ) 25 MG TB24 tablet Take 1 tablet (25 mg total) by mouth daily.   mupirocin ointment (BACTROBAN) 2 % Apply 1 application topically 3 (three) times daily.   naloxegol oxalate (MOVANTIK) 25 MG TABS tablet Take 25 mg by mouth daily.   Naloxone HCl 0.4 MG/0.4ML SOAJ Administer 0.47m [place] at first sign of opioid overdose and repeat every 2 minutes as needed for resuscitation. Call 911 immediately   Nerve Stimulator (CEFALY KIT) DEVI 20 Minutes by Does not apply route as directed.  NON FORMULARY [facility]  Peripheral Neuropathy Cream- Bupivacaine 1%, Doxepin 3%, Gabapentin 6%, Pentoxifylline 3%, Topiramate 1% Apply 1-2 grams to affected area 3-4 times daily Qty. 120 gm 3 refills   ondansetron (ZOFRAN) 8 MG tablet Take 1 tablet (8 mg total) by mouth every 8 (eight) hours as needed for nausea or vomiting.   pregabalin (LYRICA) 50 MG capsule Take 1 capsule (50 mg total) by mouth 3 (three) times daily.   Probiotic Product (PROBIOTIC-10 PO) Take by mouth.    SUMAtriptan (IMITREX) 100 MG tablet Take 1 tablet at earliest onset of headache, may repeat in 2 hours if headache persists or reoccurs.   tizanidine (ZANAFLEX) 2 MG capsule TAKE 1 CAPSULE (2 MG TOTAL) BY MOUTH 3 (THREE) TIMES DAILY.   traZODone (DESYREL) 100 MG tablet Take 0.5-1 tablets (50-100 mg total) by mouth at bedtime as needed for sleep. And 1/2 -1 if wakes at 4am.   valACYclovir (VALTREX) 1000 MG tablet Take 1 tablet (1,000 mg total) by mouth 3 (three) times daily.    Vitamin D, Ergocalciferol, (DRISDOL) 1.25 MG (50000 UT) CAPS capsule Take 1 capsule (50,000 Units total) by mouth every 7 (seven) days.   Past Medical History:  Diagnosis Date   Allergy    Anal fissure 10/08/2016   Anemia    anemia in the past   Angina    takes Propanolol prn and it stops her chest   Anxiety    Barrett esophagus    not consistently present   Cataract    Chronic kidney disease    kidney stones and UTI   Complication of anesthesia    either  BP or pulse dropped with last surgery   DDD (degenerative disc disease)    cervical and lumbar   Dementia (HCC)    Depression    post traumatic stress  disorder   Diabetes ([place]) 11/18/2016   Diabetes mellitus    sugar goes up and down diet controlled   Dysrhythmia    brought on by stress   External hemorrhoids    Fatigue    Fibromyalgia    neuropathy knees ankles and toes, bladder   GERD (gastroesophageal reflux disease)    H/O hyperparathyroidism    Headache(784.0)    migraines   Hiatal hernia    History of kidney stones    Hypertension    3 small brain aneurysms   Hypoglycemia 11/08/2016   IBS (irritable bowel syndrome)    Internal hemorrhoids    Macular degeneration    Migraines    Mitral regurgitation 12/11/2015   Osteoarthritis    all over   Osteopetrosis    Peripheral vascular disease ([place])    superficial phlebitis in left calf   Personal history of colonic polyps 07/22/2013   07/2013 - 3 small adenomas - repeat colonoscopy 2018   Small intestinal bacterial overgrowth 08/05/2017   + lactulose breath test 06/2017 Xifaxan Rx   Past Surgical History:  Procedure Laterality Date   ABDOMINAL HYSTERECTOMY     APPENDECTOMY     CARDIAC CATHETERIZATION  03/22/1991   EF 61%   CARDIOVASCULAR STRESS TEST  10/03/2006   CHOLECYSTECTOMY     COLONOSCOPY  06/23/2008   normal   DILATION AND CURETTAGE OF UTERUS     x2   ESOPHAGUS SURGERY     EXPLORATORY LAPAROTOMY  1993   EYE  SURGERY     GASTRIC BYPASS  1999   GASTRIC RESTRICTION SURGERY  1991   LASIK Bilateral  Right Arm Surgery     Right Knee Arthroscopy     Rt wrist fx  2009   stomach stappeling  1991   STONE EXTRACTION WITH BASKET  2012   TONSILLECTOMY     TUBAL LIGATION     UPPER GASTROINTESTINAL ENDOSCOPY  06/23/2008   w/Dil, Barrett's esophagus   US ECHOCARDIOGRAPHY  01/21/2007   EF 55-60%   US ECHOCARDIOGRAPHY  08/30/2003   EF 55-60%   Social History   Social History Narrative   She retired on disability   Married   2 Children   family history includes Breast cancer in her sister; Clotting disorder in her sister; Colon cancer in her maternal aunt, paternal aunt, and paternal aunt; Diabetes in her mother; Heart disease in her father, mother, sister, and another family member; Hypertension in her father; Kidney disease in her father and sister; Lung cancer in her father, mother, and sister; Pancreatic cancer in her sister; Seizures in her father; Stroke in her father and mother; Uterine cancer in an other family member.   Review of Systems As per HPI Objective:   Physical Exam BP (!) 96/54    Pulse 63    Temp (!) 97.4 F (36.3 C) (Temporal)    Ht _0  (1.651 m)    Wt 105 lb (47.6 kg)    BMI 17.47 kg/m  NAD thin Anicteric Lungs cta Cor NL  abd thin wall Scars, diastasis Tympanitic BS + Tender mild above umbilicus  Patti Martinique, CMA present.   Anoderm inspection revealed no significant abnormality Anal wink was absent Digital exam revealed normal resting tone and voluntary squeeze. No mass or rectocele present. Simulated defecation with valsalva revealed appropriate abdominal contraction and descent.

## 2018-10-01 NOTE — Patient Instructions (Addendum)
You have been scheduled for an endoscopy. Please follow written instructions given to you at your visit today. If you use inhalers (even only as needed), please bring them with you on the day of your procedure.   We have sent the following prescriptions to your mail in pharmacy: [place] If you have not heard from your mail in pharmacy within 1 week or if you have not received your medication in the mail, please contact us at [phone] so we may find out why.   Please go to the basement and get an x-ray before you leave today.   I appreciate the opportunity to care for you. Silvano Rusk, MD, [facility]

## 2018-10-01 NOTE — Assessment & Plan Note (Signed)
Repeat Xifaxan

## 2018-10-02 NOTE — Progress Notes (Signed)
[place],  Nothing bad on the xray.  Large amount of stool seen - not necessarily a surprise.  I think a purge may help both acutely and more long-term.  I recommend that you take 4 dulocolax tabs, waith 1 hour and then drink 6 glasses of water - 6oz each with 1 tablespoon or cap of MiraLax mixed into each glass.  This should clean things out and reset the bowels.  Stay on other medications.  Obviously need an afternoon or evenening that is convenient to do this.  I appreciate the opportunity to care for you. Gatha Mayer, MD, [facility]

## 2018-10-03 DIAGNOSIS — R1319 Other dysphagia: Secondary | ICD-10-CM | POA: Insufficient documentation

## 2018-10-03 DIAGNOSIS — R1310 Dysphagia, unspecified: Secondary | ICD-10-CM | POA: Insufficient documentation

## 2018-10-03 DIAGNOSIS — F1120 Opioid dependence, uncomplicated: Secondary | ICD-10-CM | POA: Insufficient documentation

## 2018-10-03 HISTORY — DX: Opioid dependence, uncomplicated: F11.20

## 2018-10-03 HISTORY — DX: Other dysphagia: R13.19

## 2018-10-03 NOTE — Assessment & Plan Note (Addendum)
Chronic pain medication may be contributing to her dysphagia and constipation both likely is

## 2018-10-03 NOTE — Assessment & Plan Note (Addendum)
Evaluate and treat with upper GI endoscopy.  Perhaps there is a subtle stricture problem not appreciated by last year's upper GI series.  The frequency and quality of her symptoms seem worse.The risks and benefits as well as alternatives of endoscopic procedure(s) have been discussed and reviewed. All questions answered. The patient agrees to proceed.  Her chronic narcotic use may be contributing as well as this is a known but underrecognized problem with dysphagia of narcotics contribute to that.  Often cause esophagogastric outflow obstruction.

## 2018-10-03 NOTE — Assessment & Plan Note (Addendum)
Seems to be multifactorial in origin.  Outlet dysfunction does not seem to be a predominant factor, could be contribution of small intestinal bacterial overgrowth, certainly her medications including her chronic narcotic use to her having a role.  I did do a 2 view abdomen which showed a fair amount of stool so I prescribed a MiraLAX purge for her.  We will see what this does and treatment of SIBO again

## 2018-10-15 ENCOUNTER — Telehealth: Payer: Self-pay | Admitting: Internal Medicine

## 2018-10-15 ENCOUNTER — Telehealth: Payer: Self-pay | Admitting: Neurology

## 2018-10-15 ENCOUNTER — Other Ambulatory Visit: Payer: Self-pay | Admitting: Family Medicine

## 2018-10-15 ENCOUNTER — Other Ambulatory Visit: Payer: Self-pay

## 2018-10-15 DIAGNOSIS — K3189 Other diseases of stomach and duodenum: Secondary | ICD-10-CM

## 2018-10-15 DIAGNOSIS — G894 Chronic pain syndrome: Secondary | ICD-10-CM | POA: Diagnosis not present

## 2018-10-15 DIAGNOSIS — M47816 Spondylosis without myelopathy or radiculopathy, lumbar region: Secondary | ICD-10-CM | POA: Diagnosis not present

## 2018-10-15 DIAGNOSIS — M47812 Spondylosis without myelopathy or radiculopathy, cervical region: Secondary | ICD-10-CM | POA: Diagnosis not present

## 2018-10-15 DIAGNOSIS — K2100 Gastro-esophageal reflux disease with esophagitis, without bleeding: Secondary | ICD-10-CM

## 2018-10-15 DIAGNOSIS — M5137 Other intervertebral disc degeneration, lumbosacral region: Secondary | ICD-10-CM

## 2018-10-15 DIAGNOSIS — G629 Polyneuropathy, unspecified: Secondary | ICD-10-CM | POA: Diagnosis not present

## 2018-10-15 DIAGNOSIS — M792 Neuralgia and neuritis, unspecified: Secondary | ICD-10-CM | POA: Diagnosis not present

## 2018-10-15 DIAGNOSIS — K449 Diaphragmatic hernia without obstruction or gangrene: Secondary | ICD-10-CM

## 2018-10-15 DIAGNOSIS — F411 Generalized anxiety disorder: Secondary | ICD-10-CM

## 2018-10-15 NOTE — Telephone Encounter (Signed)
Copied from [place] [id number]. Topic: Quick Communication - Rx Refill/Question >> Oct 15, 2018  3:08 PM Leward Quan A wrote: Medication: mirabegron ER (MYRBETRIQ) 25 MG TB24 tablet, Vitamin D, Ergocalciferol, (DRISDOL) 1.25 MG (50000 UT) CAPS capsule   Has the patient contacted their pharmacy? Yes.   (Agent: If no, request that the patient contact the pharmacy for the refill.) (Agent: If yes, when and what did the pharmacy advise?)  Preferred Pharmacy (with phone number or street name): [facility] [place], [place] - [address] (Phone) [phone] (Fax)    Agent: Please be advised that RX refills may take up to 3 business days. We ask that you follow-up with your pharmacy.

## 2018-10-15 NOTE — Telephone Encounter (Signed)
Pt needs rf of Nexium and dicyclomine sent to [facility]

## 2018-10-15 NOTE — Telephone Encounter (Signed)
Requested medication (s) are due for refill today: yes  Requested medication (s) are on the active medication list: yes  Last refill: 07/10/2018  Future visit scheduled: no  Notes to clinic:  Review for refill  Requested Prescriptions  Pending Prescriptions Disp Refills   Vitamin D, Ergocalciferol, (DRISDOL) 1.25 MG (50000 UT) CAPS capsule 12 capsule 3    Sig: Take 1 capsule (50,000 Units total) by mouth every 7 (seven) days.     Endocrinology:  Vitamins - Vitamin D Supplementation Failed - 10/15/2018  3:14 PM      Failed - 50,000 IU strengths are not delegated      Failed - Phosphate in normal range and within 360 days    Phosphorus  Date Value Ref Range Status  03/06/2017 3.8 2.5 - 4.5 mg/dL Final         Failed - Vitamin D in normal range and within 360 days    Vit D, 25-Hydroxy  Date Value Ref Range Status  07/06/2018 8.8 (L) 30.0 - 100.0 ng/mL Final    Comment:    Vitamin D deficiency has been defined by the Institute of Medicine and an Endocrine Society practice guideline as a level of serum 25-OH vitamin D less than 20 ng/mL (1,2). The Endocrine Society went on to further define vitamin D insufficiency as a level between 21 and 29 ng/mL (2). 1. IOM (Institute of Medicine). 2010. Dietary reference    intakes for calcium and D. [place]: The    [employer]. 2. Holick MF, Binkley Charlton, Bischoff-Ferrari HA, et al.    Evaluation, treatment, and prevention of vitamin D    deficiency: an Endocrine Society clinical practice    guideline. JCEM. 2011 Jul; 96(7):1911-30.          Passed - Ca in normal range and within 360 days    Calcium  Date Value Ref Range Status  07/06/2018 9.5 8.7 - 10.3 mg/dL Final         Passed - Valid encounter within last 12 months    Recent Outpatient Visits          1 month ago Medicare annual wellness visit, subsequent   Primary Care at [facility], Arlie Solomons, MD   3 months ago Screen for colon cancer   Primary Care at  [facility], Arlie Solomons, MD   7 months ago Hypoglycemia   [facility] at [facility], Laurey Arrow, MD   8 months ago Type 2 diabetes mellitus with hypoglycemia without coma, without long-term current use of insulin [facility])   Primary Care at [facility], [place], MD   9 months ago Copper deficiency myeloneuropathy [facility])   Primary Care at [facility], Laurey Arrow, MD              mirabegron ER (MYRBETRIQ) 25 MG TB24 tablet 90 tablet 1    Sig: Take 1 tablet (25 mg total) by mouth daily.     Urology: Bladder Agents - mirabegron Passed - 10/15/2018  3:14 PM      Passed - Last BP in normal range    BP Readings from Last 1 Encounters:  10/01/18 (!) 96/54         Passed - Valid encounter within last 12 months    Recent Outpatient Visits          1 month ago Medicare annual wellness visit, subsequent   Primary Care at [facility], Arlie Solomons, MD   3 months ago Screen for  colon cancer   Primary Care at [facility], Arlie Solomons, MD   7 months ago Hypoglycemia   [facility] at [facility], Laurey Arrow, MD   8 months ago Type 2 diabetes mellitus with hypoglycemia without coma, without long-term current use of insulin [facility])   Primary Care at [facility], [place], MD   9 months ago Copper deficiency myeloneuropathy [facility])   Primary Care at [facility], Laurey Arrow, MD

## 2018-10-15 NOTE — Telephone Encounter (Signed)
Seen recently Sir, how many refills do you approve?

## 2018-10-15 NOTE — Telephone Encounter (Signed)
Also patient wanted to update Dr. Tomi Likens know she is meeting with neurosurgeon about the having surgery on her leads that where pulled out. Thanks!

## 2018-10-15 NOTE — Telephone Encounter (Signed)
Medication refill: clonazePAM (KLONOPIN) 1 MG tablet [[id number]]   diclofenac sodium (VOLTAREN) 1 % GEL [[id number]]   docusate sodium (COLACE) 250 MG capsule [[id number]]  DULoxetine (CYMBALTA) 30 MG capsule [[id number]]   lidocaine (LIDODERM) 5 % [[id number]]  metroNIDAZOLE (METROGEL) 1 % gel [[id number]]   mupirocin ointment (BACTROBAN) 2 %  [[id number]]   ondansetron (ZOFRAN) 8 MG tablet [[id number]]  traZODone (DESYREL) 100 MG tablet [[id number]]   [facility] - [place], [place] - [address] [phone] (Phone) [phone] (Fax)   Pt aware of turn around time.    NON FORMULARY [[id number]]  [facility]  Peripheral Neuropathy Cream-  Bupivacaine 1%, Doxepin 3%, Gabapentin 6%, Pentoxifylline 3%, Topiramate 1%  Apply 1-2 grams to affected area 3-4 times daily  Qty. 120 gm  This medication needs to be sent to [facility] [address], [place], [place]

## 2018-10-15 NOTE — Telephone Encounter (Signed)
Refills - flexareal 10mg , aimovig injection, lyrica 50mg , sumatriptan 100mg  to the [place] on file [place]. 90 days supply if possible of these.Thanks!

## 2018-10-15 NOTE — Telephone Encounter (Signed)
Requested medication (s) are due for refill today: yes  Requested medication (s) are on the active medication list: yes  Last refill: 02/11/2018  Future visit scheduled: no  Notes to clinic:  Review for refill   Requested Prescriptions  Pending Prescriptions Disp Refills   clonazePAM (KLONOPIN) 1 MG tablet 180 tablet 0    Sig: Take 0.5-1 tablets (0.5-1 mg total) by mouth 2 (two) times daily as needed for anxiety.     Not Delegated - Psychiatry:  Anxiolytics/Hypnotics Failed - 10/15/2018  2:38 PM      Failed - This refill cannot be delegated      Failed - Urine Drug Screen completed in last 360 days.      Passed - Valid encounter within last 6 months    Recent Outpatient Visits          1 month ago Medicare annual wellness visit, subsequent   Primary Care at [facility], Arlie Solomons, MD   3 months ago Screen for colon cancer   Primary Care at [place], [place]   7 months ago Hypoglycemia   [facility] at [facility], Laurey Arrow, MD   8 months ago Type 2 diabetes mellitus with hypoglycemia without coma, without long-term current use of insulin [facility])   Primary Care at [facility], [place], MD   9 months ago Copper deficiency myeloneuropathy [facility])   Primary Care at [facility], Laurey Arrow, MD              diclofenac sodium (VOLTAREN) 1 % GEL 700 g 3    Sig: Apply 2 g topically 4 (four) times daily.     Analgesics:  Topicals Passed - 10/15/2018  2:38 PM      Passed - Valid encounter within last 12 months    Recent Outpatient Visits          1 month ago Medicare annual wellness visit, subsequent   Primary Care at [facility], Arlie Solomons, MD   3 months ago Screen for colon cancer   Primary Care at [facility], Arlie Solomons, MD   7 months ago Hypoglycemia   Primary [facility] at [facility], Laurey Arrow, MD   8 months ago Type 2 diabetes mellitus with hypoglycemia without coma, without long-term current use of insulin [facility])   Primary Care at [facility], [place], MD   9 months ago Copper deficiency myeloneuropathy [facility])   Primary Care at [facility], Laurey Arrow, MD              traZODone (DESYREL) 100 MG tablet 90 tablet 1    Sig: Take 0.5-1 tablets (50-100 mg total) by mouth at bedtime as needed for sleep. And 1/2 -1 if wakes at 4am.     Psychiatry: Antidepressants - Serotonin Modulator Passed - 10/15/2018  2:38 PM      Passed - Valid encounter within last 6 months    Recent Outpatient Visits          1 month ago Medicare annual wellness visit, subsequent   Primary Care at [facility], Arlie Solomons, MD   3 months ago Screen for colon cancer   Primary Care at [name], MD   7 months ago Hypoglycemia   [facility] at [facility], Laurey Arrow, MD   8 months ago Type 2 diabetes mellitus with hypoglycemia without coma, without long-term current use of insulin [facility])   Primary Care at [facility], Ines Bloomer, MD  9 months ago Copper deficiency myeloneuropathy [facility])   Primary Care at [place], MD             Passed - Completed PHQ-2 or PHQ-9 in the last 360 days.       metroNIDAZOLE (METROGEL) 1 % gel 45 g 0    Sig: Apply topically daily.     Off-Protocol Failed - 10/15/2018  2:38 PM      Failed - Medication not assigned to a protocol, review manually.      Passed - Valid encounter within last 12 months    Recent Outpatient Visits          1 month ago Medicare annual wellness visit, subsequent   Primary Care at [facility], Arlie Solomons, MD   3 months ago Screen for colon cancer   Primary Care at [facility], Arlie Solomons, MD   7 months ago Hypoglycemia   [facility] at [facility], Laurey Arrow, MD   8 months ago Type 2 diabetes mellitus with hypoglycemia without coma, without long-term current use of insulin [facility])   Primary Care at [facility], [place], MD   9 months ago Copper deficiency myeloneuropathy [facility])   Primary Care at [facility], Laurey Arrow, MD              lidocaine (LIDODERM) 5 % 90 patch 3    Sig:  Place 1 patch onto the skin as needed. Remove & Discard patch within 12 hours. May dispense as 3 month supply     Analgesics:  Topicals Passed - 10/15/2018  2:38 PM      Passed - Valid encounter within last 12 months    Recent Outpatient Visits          1 month ago Medicare annual wellness visit, subsequent   Primary Care at [facility], Arlie Solomons, MD   3 months ago Screen for colon cancer   Primary Care at [place], MD   7 months ago Hypoglycemia   Primary [facility] at [facility], Laurey Arrow, MD   8 months ago Type 2 diabetes mellitus with hypoglycemia without coma, without long-term current use of insulin [facility])   Primary Care at [facility], [place], MD   9 months ago Copper deficiency myeloneuropathy [facility])   Primary Care at [facility], Laurey Arrow, MD              dicyclomine (BENTYL) 10 MG capsule 270 capsule 1    Sig: Take 1-2 tab 3 times daily AC as needed for spasms and cramping.     Gastroenterology:  Antispasmodic Agents Passed - 10/15/2018  2:38 PM      Passed - Last Heart Rate in normal range    Pulse Readings from Last 1 Encounters:  10/01/18 63         Passed - Valid encounter within last 12 months    Recent Outpatient Visits          1 month ago Medicare annual wellness visit, subsequent   Primary Care at [facility], Arlie Solomons, MD   3 months ago Screen for colon cancer   Primary Care at [facility], Arlie Solomons, MD   7 months ago Hypoglycemia   Primary [facility] at [facility], Laurey Arrow, MD   8 months ago Type 2 diabetes mellitus with hypoglycemia without coma, without long-term current use of insulin [facility])   Primary Care at [facility], Ines Bloomer, MD   9  months ago Copper deficiency myeloneuropathy [facility])   Primary Care at [facility], Laurey Arrow, MD              DULoxetine (CYMBALTA) 30 MG capsule 90 capsule 5    Sig: Take 3 capsules (90 mg total) by mouth daily.     Psychiatry: Antidepressants - SNRI Passed - 10/15/2018  2:38 PM      Passed -  Last BP in normal range    BP Readings from Last 1 Encounters:  10/01/18 (!) 96/54         Passed - Valid encounter within last 6 months    Recent Outpatient Visits          1 month ago Medicare annual wellness visit, subsequent   Primary Care at [facility], Arlie Solomons, MD   3 months ago Screen for colon cancer   Primary Care at [facility], Arlie Solomons, MD   7 months ago Hypoglycemia   [facility] at [facility], Laurey Arrow, MD   8 months ago Type 2 diabetes mellitus with hypoglycemia without coma, without long-term current use of insulin [facility])   Primary Care at [facility], Ines Bloomer, MD   9 months ago Copper deficiency myeloneuropathy [facility])   Primary Care at [place], MD             Passed - Completed PHQ-2 or PHQ-9 in the last 360 days.       mupirocin ointment (BACTROBAN) 2 % 30 g 1    Sig: Apply 1 application topically 3 (three) times daily.     Off-Protocol Failed - 10/15/2018  2:38 PM      Failed - Medication not assigned to a protocol, review manually.      Passed - Valid encounter within last 12 months    Recent Outpatient Visits          1 month ago Medicare annual wellness visit, subsequent   Primary Care at [facility], Arlie Solomons, MD   3 months ago Screen for colon cancer   Primary Care at [facility], [place], MD   7 months ago Hypoglycemia   Primary [facility] at [facility], Laurey Arrow, MD   8 months ago Type 2 diabetes mellitus with hypoglycemia without coma, without long-term current use of insulin [facility])   Primary Care at [facility], [employer], MD   9 months ago Copper deficiency myeloneuropathy [facility])   Primary Care at [facility], Laurey Arrow, MD              ondansetron (ZOFRAN) 8 MG tablet 60 tablet 0    Sig: Take 1 tablet (8 mg total) by mouth every 8 (eight) hours as needed for nausea or vomiting.     Not Delegated - Gastroenterology: Antiemetics Failed - 10/15/2018  2:38 PM      Failed - This refill cannot be delegated      Passed  - Valid encounter within last 6 months    Recent Outpatient Visits          1 month ago Medicare annual wellness visit, subsequent   Primary Care at [facility], Arlie Solomons, MD   3 months ago Screen for colon cancer   Primary Care at [facility], [place], MD   7 months ago Hypoglycemia   Primary Care at [facility], Laurey Arrow, MD   8 months ago Type 2 diabetes mellitus with hypoglycemia without coma, without long-term current use of insulin ([place])  Primary Care at [facility], [place], MD   9 months ago Copper deficiency myeloneuropathy [facility])   Primary Care at [facility], Laurey Arrow, MD

## 2018-10-16 ENCOUNTER — Other Ambulatory Visit: Payer: Self-pay | Admitting: Neurology

## 2018-10-16 DIAGNOSIS — G43C0 Periodic headache syndromes in child or adult, not intractable: Secondary | ICD-10-CM

## 2018-10-16 MED ORDER — DICYCLOMINE HCL 10 MG PO CAPS: ORAL_CAPSULE | ORAL | 3 refills | Status: DC

## 2018-10-16 MED ORDER — PREGABALIN 50 MG PO CAPS: 50.0000 mg | ORAL_CAPSULE | Freq: Three times a day (TID) | ORAL | 3 refills | Status: DC

## 2018-10-16 MED ORDER — CYCLOBENZAPRINE HCL 10 MG PO TABS: 10.0000 mg | ORAL_TABLET | Freq: Three times a day (TID) | ORAL | 3 refills | Status: DC | PRN

## 2018-10-16 MED ORDER — SUMATRIPTAN SUCCINATE 100 MG PO TABS: ORAL_TABLET | ORAL | 1 refills | Status: DC

## 2018-10-16 MED ORDER — ESOMEPRAZOLE MAGNESIUM 40 MG PO CPDR: 40.0000 mg | DELAYED_RELEASE_CAPSULE | Freq: Every day | ORAL | 3 refills | Status: DC

## 2018-10-16 MED ORDER — AIMOVIG 140 MG/ML ~~LOC~~ SOAJ: 140.0000 mg | SUBCUTANEOUS | 3 refills | Status: DC

## 2018-10-16 NOTE — Telephone Encounter (Signed)
1year

## 2018-10-16 NOTE — Telephone Encounter (Signed)
Refills sent in as approved.

## 2018-10-16 NOTE — Telephone Encounter (Signed)
90 day supply prescriptions sent to [facility]

## 2018-10-19 ENCOUNTER — Ambulatory Visit: Payer: Medicare Other | Admitting: Endocrinology

## 2018-10-20 MED ORDER — METRONIDAZOLE 1 % EX GEL: Freq: Every day | CUTANEOUS | 0 refills | Status: AC

## 2018-10-20 MED ORDER — TRAZODONE HCL 100 MG PO TABS: 50.0000 mg | ORAL_TABLET | Freq: Every evening | ORAL | 1 refills | Status: DC | PRN

## 2018-10-20 MED ORDER — DICYCLOMINE HCL 10 MG PO CAPS: ORAL_CAPSULE | ORAL | 1 refills | Status: DC

## 2018-10-20 MED ORDER — DULOXETINE HCL 30 MG PO CPEP: 90.0000 mg | ORAL_CAPSULE | Freq: Every day | ORAL | 5 refills | Status: DC

## 2018-10-20 MED ORDER — CLONAZEPAM 1 MG PO TABS: 0.5000 mg | ORAL_TABLET | Freq: Two times a day (BID) | ORAL | 0 refills | Status: DC | PRN

## 2018-10-20 MED ORDER — LIDOCAINE 5 % EX PTCH: 1.0000 | MEDICATED_PATCH | CUTANEOUS | 3 refills | Status: AC | PRN

## 2018-10-20 MED ORDER — DICLOFENAC SODIUM 1 % TD GEL: 2.0000 g | Freq: Four times a day (QID) | TRANSDERMAL | 3 refills | Status: DC

## 2018-10-20 MED ORDER — MIRABEGRON ER 25 MG PO TB24: 25.0000 mg | ORAL_TABLET | Freq: Every day | ORAL | 1 refills | Status: AC

## 2018-10-20 MED ORDER — ONDANSETRON HCL 8 MG PO TABS: 8.0000 mg | ORAL_TABLET | Freq: Three times a day (TID) | ORAL | 0 refills | Status: AC | PRN

## 2018-10-20 MED ORDER — VITAMIN D (ERGOCALCIFEROL) 1.25 MG (50000 UNIT) PO CAPS: 50000.0000 [IU] | ORAL_CAPSULE | ORAL | 3 refills | Status: DC

## 2018-10-20 MED ORDER — MUPIROCIN 2 % EX OINT: 1.0000 "application " | TOPICAL_OINTMENT | Freq: Three times a day (TID) | CUTANEOUS | 1 refills | Status: DC

## 2018-10-21 ENCOUNTER — Telehealth: Payer: Self-pay

## 2018-10-21 DIAGNOSIS — G894 Chronic pain syndrome: Secondary | ICD-10-CM | POA: Diagnosis not present

## 2018-10-21 NOTE — Telephone Encounter (Signed)
Covid-19 screening questions   Do you now or have you had a fever in the last 14 days?  Do you have any respiratory symptoms of shortness of breath or cough now or in the last 14 days?  Do you have any family members or close contacts with diagnosed or suspected Covid-19 in the past 14 days?  Have you been tested for Covid-19 and found to be positive?       

## 2018-10-22 ENCOUNTER — Encounter: Payer: Self-pay | Admitting: Internal Medicine

## 2018-10-22 ENCOUNTER — Other Ambulatory Visit: Payer: Self-pay

## 2018-10-22 ENCOUNTER — Ambulatory Visit (AMBULATORY_SURGERY_CENTER): Payer: Medicare Other | Admitting: Internal Medicine

## 2018-10-22 VITALS — BP 110/64 | HR 78 | Temp 98.5°F | Resp 14 | Ht 65.0 in | Wt 105.0 lb

## 2018-10-22 DIAGNOSIS — E119 Type 2 diabetes mellitus without complications: Secondary | ICD-10-CM | POA: Diagnosis not present

## 2018-10-22 DIAGNOSIS — Z79891 Long term (current) use of opiate analgesic: Secondary | ICD-10-CM | POA: Diagnosis not present

## 2018-10-22 DIAGNOSIS — K449 Diaphragmatic hernia without obstruction or gangrene: Secondary | ICD-10-CM | POA: Diagnosis not present

## 2018-10-22 DIAGNOSIS — K219 Gastro-esophageal reflux disease without esophagitis: Secondary | ICD-10-CM | POA: Diagnosis not present

## 2018-10-22 DIAGNOSIS — I739 Peripheral vascular disease, unspecified: Secondary | ICD-10-CM | POA: Diagnosis not present

## 2018-10-22 DIAGNOSIS — R1319 Other dysphagia: Secondary | ICD-10-CM

## 2018-10-22 DIAGNOSIS — R1310 Dysphagia, unspecified: Secondary | ICD-10-CM | POA: Diagnosis not present

## 2018-10-22 DIAGNOSIS — M797 Fibromyalgia: Secondary | ICD-10-CM | POA: Diagnosis not present

## 2018-10-22 MED ORDER — SODIUM CHLORIDE 0.9 % IV SOLN: 500.0000 mL | Freq: Once | INTRAVENOUS | Status: DC

## 2018-10-22 MED ORDER — SUCRALFATE 1 GM/10ML PO SUSP: 1.0000 g | Freq: Four times a day (QID) | ORAL | 1 refills | Status: DC

## 2018-10-22 MED ORDER — SUCRALFATE 1 G PO TABS: 1.0000 g | ORAL_TABLET | Freq: Three times a day (TID) | ORAL | 0 refills | Status: DC

## 2018-10-22 NOTE — Progress Notes (Signed)
Called to room to assist during endoscopic procedure.  Patient ID and intended procedure confirmed with present staff. Received instructions for my participation in the procedure from the performing physician.  

## 2018-10-22 NOTE — Progress Notes (Signed)
Report to PACU, RN, vss, BBS= Clear.  

## 2018-10-22 NOTE — Progress Notes (Signed)
Pt's states no medical or surgical changes since previsit or office visit except she is planning to have a spinal nerve stimulator inserted on 10/27/2018.

## 2018-10-22 NOTE — Patient Instructions (Addendum)
Things looked ok - I stretched the esophagus to see if that helps.  Narcotics may be affecting swallowing (making it a problem) as well as contributing to constipation.  No signs of cancer or any blockages.  I appreciate the opportunity to care for you. Gatha Mayer, MD, [facility]  Clear liquids until 11am today, then soft diet the rest of the day, start normal diet tomorrow  Carafate for 1 month for possible ulcer  YOU HAD AN ENDOSCOPIC PROCEDURE TODAY AT [place]:   Refer to the procedure report that was given to you for any specific questions about what was found during the examination.  If the procedure report does not answer your questions, please call your gastroenterologist to clarify.  If you requested that your care partner not be given the details of your procedure findings, then the procedure report has been included in a sealed envelope for you to review at your convenience later.  YOU SHOULD EXPECT: Some feelings of bloating in the abdomen. Passage of more gas than usual.  Walking can help get rid of the air that was put into your GI tract during the procedure and reduce the bloating. If you had a lower endoscopy (such as a colonoscopy or flexible sigmoidoscopy) you may notice spotting of blood in your stool or on the toilet paper. If you underwent a bowel prep for your procedure, you may not have a normal bowel movement for a few days.  Please Note:  You might notice some irritation and congestion in your nose or some drainage.  This is from the oxygen used during your procedure.  There is no need for concern and it should clear up in a day or so.  SYMPTOMS TO REPORT IMMEDIATELY:   Following upper endoscopy (EGD)  Vomiting of blood or coffee ground material  New chest pain or pain under the shoulder blades  Painful or persistently difficult swallowing  New shortness of breath  Fever of 100F or higher  Black, tarry-looking stools  For urgent or emergent  issues, a gastroenterologist can be reached at any hour by calling [phone].   DIET:  We do recommend a small meal at first, but then you may proceed to your regular diet.  Drink plenty of fluids but you should avoid alcoholic beverages for 24 hours.  ACTIVITY:  You should plan to take it easy for the rest of today and you should NOT DRIVE or use heavy machinery until tomorrow (because of the sedation medicines used during the test).    FOLLOW UP: Our staff will call the number listed on your records 48-72 hours following your procedure to check on you and address any questions or concerns that you may have regarding the information given to you following your procedure. If we do not reach you, we will leave a message.  We will attempt to reach you two times.  During this call, we will ask if you have developed any symptoms of COVID 19. If you develop any symptoms (ie: fever, flu-like symptoms, shortness of breath, cough etc.) before then, please call [phone].  If you test positive for Covid 19 in the 2 weeks post procedure, please call and report this information to [place].    If any biopsies were taken you will be contacted by phone or by letter within the next 1-3 weeks.  Please call us at [phone] if you have not heard about the biopsies in 3 weeks.    SIGNATURES/CONFIDENTIALITY: You  and/or your care partner have signed paperwork which will be entered into your electronic medical record.  These signatures attest to the fact that that the information above on your After Visit Summary has been reviewed and is understood.  Full responsibility of the confidentiality of this discharge information lies with you and/or your care-partner.

## 2018-10-22 NOTE — Op Note (Signed)
[place] Patient Name: Kayla [name] Procedure Date: 10/22/2018 9:17 AM MRN: [id number] Endoscopist: Gatha Mayer , MD Age: 73 Referring MD:  Date of Birth: 1945-10-04 Gender: Female Account #: [id number] Procedure:                Upper GI endoscopy Indications:              Dysphagia Medicines:                Propofol per Anesthesia, Monitored Anesthesia Care Procedure:                Pre-Anesthesia Assessment:                           - Prior to the procedure, a History and Physical                            was performed, and patient medications and                            allergies were reviewed. The patient's tolerance of                            previous anesthesia was also reviewed. The risks                            and benefits of the procedure and the sedation                            options and risks were discussed with the patient.                            All questions were answered, and informed consent                            was obtained. Prior Anticoagulants: The patient has                            taken no previous anticoagulant or antiplatelet                            agents. ASA Grade Assessment: III - A patient with                            severe systemic disease. After reviewing the risks                            and benefits, the patient was deemed in                            satisfactory condition to undergo the procedure.                           After obtaining informed consent, the endoscope was  passed under direct vision. Throughout the                            procedure, the patient's blood pressure, pulse, and                            oxygen saturations were monitored continuously. The                            Endoscope was introduced through the mouth, and                            advanced to the efferent jejunal loop. The upper GI                            endoscopy was  accomplished without difficulty. The                            patient tolerated the procedure well. Scope In: Scope Out: Findings:                 No endoscopic abnormality was evident in the                            esophagus to explain the patient's complaint of                            dysphagia. It was decided, however, to proceed with                            dilation of the entire esophagus. The scope was                            withdrawn. Dilation was performed with a Maloney                            dilator with no resistance at 89 Fr. The dilation                            site was examined following endoscope reinsertion                            and showed no change. Estimated blood loss: none.                           Evidence of a gastrojejunostomy was found in the                            stomach. This was characterized by healthy                            appearing mucosa. Slight scope trauma and bleeding  at the gastrojejunostomy anastomosis.                           The cardia and gastric fundus were normal on                            retroflexion s/p gastrojejunostomy. Complications:            No immediate complications. Estimated Blood Loss:     Estimated blood loss was minimal. Impression:               - No endoscopic esophageal abnormality to explain                            patient's dysphagia. Esophagus dilated. Dilated. 12                            Fr - no heme                           - A gastrojejunostomy was found, characterized by                            healthy appearing mucosa. Slight trauma at                            anastomosis from scope passage vs small very                            superficial ulcer - favor former - small affarent                            limb and a normal efferent limb                           - No specimens collected. Recommendation:           - Patient has a contact  number available for                            emergencies. The signs and symptoms of potential                            delayed complications were discussed with the                            patient. Return to normal activities tomorrow.                            Written discharge instructions were provided to the                            patient.                           - Clear liquids x 1 hour then soft foods rest of  day. Start prior diet tomorrow.                           - Continue present medications. Will add carafate x                            1 month in case this is an ulcer Gatha Mayer, MD 10/22/2018 10:03:40 AM This report has been signed electronically.

## 2018-10-22 NOTE — Progress Notes (Signed)
Vitals CW Temp Kayla [name]

## 2018-10-26 ENCOUNTER — Telehealth: Payer: Self-pay | Admitting: *Deleted

## 2018-10-26 ENCOUNTER — Telehealth: Payer: Self-pay

## 2018-10-26 NOTE — Telephone Encounter (Signed)
Left message on follow up call. 

## 2018-10-26 NOTE — Telephone Encounter (Signed)
Patient called back and said that she is doing fine.

## 2018-10-26 NOTE — Telephone Encounter (Signed)
  Follow up Call-  Call back number 10/22/2018  Post procedure Call Back phone  # [phone]  Permission to leave phone message Yes  Some recent data might be hidden     Patient questions:  Do you have a fever, pain , or abdominal swelling? No. Pain Score  0 *  Have you tolerated food without any problems? Yes.    Have you been able to return to your normal activities? Yes.    Do you have any questions about your discharge instructions: Diet   No. Medications  No. Follow up visit  No.  Do you have questions or concerns about your Care? No.  Actions: * If pain score is 4 or above: No action needed, pain <4.    1. Have you developed a fever since your procedure? no  2.   Have you had an respiratory symptoms (SOB or cough) since your procedure? no  3.   Have you tested positive for COVID 19 since your procedure no  4.   Have you had any family members/close contacts diagnosed with the COVID 19 since your procedure?  no   If yes to any of these questions please route to Joylene John, RN and Alphonsa Gin, Therapist, sports.

## 2018-10-27 ENCOUNTER — Ambulatory Visit: Payer: Medicare Other | Admitting: Endocrinology

## 2018-10-27 DIAGNOSIS — M542 Cervicalgia: Secondary | ICD-10-CM | POA: Diagnosis not present

## 2018-10-27 DIAGNOSIS — M47812 Spondylosis without myelopathy or radiculopathy, cervical region: Secondary | ICD-10-CM | POA: Diagnosis not present

## 2018-10-27 DIAGNOSIS — G8929 Other chronic pain: Secondary | ICD-10-CM | POA: Diagnosis not present

## 2018-10-28 ENCOUNTER — Telehealth: Payer: Self-pay

## 2018-10-28 NOTE — Telephone Encounter (Signed)
NOTES ON FILE FROM DR Rennis Harding [phone], REFERRAL SENT TO SCHEDULING

## 2018-10-29 ENCOUNTER — Telehealth: Payer: Self-pay

## 2018-10-29 NOTE — Telephone Encounter (Signed)
   Primary Cardiologist:Philip Nahser, MD  Chart reviewed as part of pre-operative protocol coverage. Because of Kayla [name] Andis's past medical history and time since last visit, he/she will require a follow-up visit in order to better assess preoperative cardiovascular risk.  Pre-op covering staff: - Please schedule appointment and call patient to inform them. - Please contact requesting surgeon's office via preferred method (i.e, phone, fax) to inform them of need for appointment prior to surgery.  If applicable, this message will also be routed to pharmacy pool and/or primary cardiologist for input on holding anticoagulant/antiplatelet agent as requested below so that this information is available at time of patient's appointment.   Rosaria Ferries, PA-C  10/29/2018, 4:49 PM   Last office visit 12/17/2017

## 2018-10-29 NOTE — Telephone Encounter (Addendum)
Called the patient Kayla [name] and explained why I was calling. She stated that she was waiting on the call. Patient is scheduled to see Daune Perch, NP tomorrow 10/30/18 at 10:45AM. Patient was made aware of the visitor restrictions and that she must wear a mask if she does not have a mask one may be provided to her. Patient verbalized understanding and all (if any) questions were answered.   While on the phone with the patient she informed that her husband, Mr. Rushie Chestnut. Cuadrado will need to get seen sooner with Dr. Acie Fredrickson due to him having the same symptoms as before and that he is pumping at 20%. I told her that I will pass this information to the proper person who will ask her more questions and that someone will be giving her and/or her husband Mr. Leatherwood a call to get more information. I created a phone encounter under husband with the information given by his wife and routed it to the triage for [facility].

## 2018-10-29 NOTE — Telephone Encounter (Signed)
I called the requesting office based off the information that was in a staff message forwarded to me.

## 2018-10-29 NOTE — Telephone Encounter (Signed)
   [facility] Pre-operative Risk Assessment    Request for surgical clearance:  1. What type of surgery is being performed?  Revision of Spinal Cord Stimulator lead with thoracic laminectomy and Revised internal pulse generator  2. When is this surgery scheduled? TBD   3. What type of clearance is required (medical clearance vs. Pharmacy clearance to hold med vs. Both)? MEDICAL  4. Are there any medications that need to be held prior to surgery and how long? NONE   5. Practice name and name of physician performing surgery? [facility]    6. What is your office phone number? [phone]    7.   What is your office fax number? [phone]  8.   Anesthesia type (None, local, MAC, general) ? General    Jacqulynn Cadet 10/29/2018, 3:48 PM  _________________________________________________________________   (provider comments below)

## 2018-10-30 ENCOUNTER — Ambulatory Visit (INDEPENDENT_AMBULATORY_CARE_PROVIDER_SITE_OTHER): Payer: Medicare Other | Admitting: Cardiology

## 2018-10-30 ENCOUNTER — Other Ambulatory Visit: Payer: Self-pay

## 2018-10-30 ENCOUNTER — Encounter: Payer: Self-pay | Admitting: Cardiology

## 2018-10-30 VITALS — BP 102/52 | HR 83 | Ht 65.0 in | Wt 102.1 lb

## 2018-10-30 DIAGNOSIS — R002 Palpitations: Secondary | ICD-10-CM

## 2018-10-30 DIAGNOSIS — Z01810 Encounter for preprocedural cardiovascular examination: Secondary | ICD-10-CM

## 2018-10-30 DIAGNOSIS — I341 Nonrheumatic mitral (valve) prolapse: Secondary | ICD-10-CM | POA: Diagnosis not present

## 2018-10-30 DIAGNOSIS — R634 Abnormal weight loss: Secondary | ICD-10-CM | POA: Diagnosis not present

## 2018-10-30 NOTE — Progress Notes (Signed)
Cardiology Office Note:    Date:  10/30/2018   ID:  Kayla [name], DOB [birth year], MRN [id number]  PCP:  Forrest Moron, MD  Cardiologist:  Mertie Moores, MD  Referring MD: Forrest Moron, MD   Chief Complaint  Patient presents with   Pre-op Exam   Follow-up    mitral valve prolapse    History of Present Illness:    Kayla [name] is a 73 y.o. female with a past medical history significant for mitral valve prolapse with mitral regurgitation, potation's, hypertension, Barrett's esophagus s/p Roux-en-Y surgery, fibromyalgia, brain aneurysm and anxiety.  She also has a history of chest pain and had normal cardiac catheterization in 1993.  Stress Myoview in 2008 was normal.  She also has a history of palpitations, controlled by beta-blocker.  She was last seen in the office on 12/17/2017 at which time she was having problems with orthostatic hypotension felt to be related to some degree of dysautonomia.  She was noted to have a poor diet and malnutrition.  Her weight was 101 pounds, down 9 pounds from the previous visit and down over 40 pounds over the last 10 years.  She was advised to eat more.  She was not having any further chest pain and her palpitations were stable.  [name] is here today for clearance to have revision of her spinal cord stimulator with thoracic laminectomy and revised internal pulse generator.  She is in a wheel chair and unable to walk more than a few steps at home due to peripheral neuropathy. She says that she is very active with crocheting and quilting that she is able to do while seated. She relies on her husband for meals and he does not cook much.   She has diet controlled diabetes and unable to eat enough, malnourished. She has nausea and dysphagia with eating. She had esophageal dilatation last week. She is also being treated for bacterial overgrowth in the gut.   She denies chest pain, pressure or shortness of breath with the little activity  that she can do. She repots lower extremity edema at the end of the day but she has none currently. She is off lasix due to low BP and has to eat salt to get it up. She has to sleep with head elevated due to reflux, not due to breathing.   She feels like her stress level and depression are better.  She still has issues with family dynamics but these have recently improved.  She has not had palpitations recently, no longer needs beta blocker.   She says she did well with propofol for recent EGD and cervical nerve ablation.    Cardiac studies   Echocardiogram 07/15/2014 Study Conclusions  - Left ventricle: The cavity size was normal. Wall thickness was   normal. Systolic function was normal. The estimated ejection   fraction was in the range of 55% to 60%. Wall motion was normal;   there were no regional wall motion abnormalities. Left   ventricular diastolic function parameters were normal. - Mitral valve: Mildly thickened leaflets . There was trivial   regurgitation. - Left atrium: The atrium was normal in size.   Past Medical History:  Diagnosis Date   Allergy    Anal fissure 10/08/2016   Anemia    anemia in the past   Angina    takes Propanolol prn and it stops her chest   Anxiety    Barrett esophagus    not consistently present  Cataract    Chronic kidney disease    kidney stones and UTI   Complication of anesthesia    either  BP or pulse dropped with last surgery   DDD (degenerative disc disease)    cervical and lumbar   Dementia (HCC)    Depression    post traumatic stress  disorder   Diabetes ([place]) 11/18/2016   Diabetes mellitus    sugar goes up and down diet controlled   Dysrhythmia    brought on by stress   External hemorrhoids    Fatigue    Fibromyalgia    neuropathy knees ankles and toes, bladder   GERD (gastroesophageal reflux disease)    H/O hyperparathyroidism    Headache(784.0)    migraines   Hiatal hernia    History of  kidney stones    Hypertension    3 small brain aneurysms   Hypoglycemia 11/08/2016   IBS (irritable bowel syndrome)    Internal hemorrhoids    Macular degeneration    Migraines    Mitral regurgitation 12/11/2015   Osteoarthritis    all over   Osteopetrosis    Peripheral vascular disease ([place])    superficial phlebitis in left calf   Personal history of colonic polyps 07/22/2013   07/2013 - 3 small adenomas - repeat colonoscopy 2018   Small intestinal bacterial overgrowth 08/05/2017   + lactulose breath test 06/2017 Xifaxan Rx    Past Surgical History:  Procedure Laterality Date   ABDOMINAL HYSTERECTOMY     APPENDECTOMY     CARDIAC CATHETERIZATION  03/22/1991   EF 61%   CARDIOVASCULAR STRESS TEST  10/03/2006   CHOLECYSTECTOMY     COLONOSCOPY  06/23/2008   normal   DILATION AND CURETTAGE OF UTERUS     x2   ESOPHAGUS SURGERY     EXPLORATORY LAPAROTOMY  1993   EYE SURGERY     GASTRIC BYPASS  1999   GASTRIC RESTRICTION SURGERY  1991   LASIK Bilateral    Right Arm Surgery     Right Knee Arthroscopy     Rt wrist fx  2009   stomach stappeling  1991   STONE EXTRACTION WITH BASKET  2012   TONSILLECTOMY     TUBAL LIGATION     UPPER GASTROINTESTINAL ENDOSCOPY  06/23/2008   w/Dil, Barrett's esophagus   US ECHOCARDIOGRAPHY  01/21/2007   EF 55-60%   US ECHOCARDIOGRAPHY  08/30/2003   EF 55-60%    Current Medications: Current Meds  Medication Sig   aspirin-acetaminophen-caffeine (EXCEDRIN MIGRAINE) 250-250-65 MG tablet Take 2 tablets by mouth 2 (two) times daily.   Blood Glucose Monitoring Suppl (FREESTYLE FREEDOM LITE) w/Device KIT 1 each by Does not apply route 4 (four) times daily. Use to monitor glucose levels 4 times daily; E11.9   clonazePAM (KLONOPIN) 1 MG tablet Take 0.5-1 tablets (0.5-1 mg total) by mouth 2 (two) times daily as needed for anxiety.   Continuous Blood Gluc Receiver (FREESTYLE LIBRE 14 DAY READER) DEVI 1 each by Does not apply  route See admin instructions. Use with sensors to monitor glucose levels; E11.9   cyclobenzaprine (FLEXERIL) 10 MG tablet Take 1 tablet (10 mg total) by mouth 3 (three) times daily as needed for muscle spasms. 90 day supply   cycloSPORINE (RESTASIS) 0.05 % ophthalmic emulsion Place 1 drop into both eyes 2 (two) times daily.   diclofenac sodium (VOLTAREN) 1 % GEL Apply 2 g topically 4 (four) times daily.   dicyclomine (BENTYL) 10 MG capsule Take  1-2 tab 3 times daily AC as needed for spasms and cramping.   diltiazem 2 % GEL Apply 1 application topically 2 (two) times daily as needed (pain from fissure).   DULoxetine (CYMBALTA) 30 MG capsule Take 3 capsules (90 mg total) by mouth daily.   Erenumab-aooe (AIMOVIG) 140 MG/ML SOAJ Inject 140 mg into the skin every 30 (thirty) days. 90 day supply   esomeprazole (NEXIUM) 40 MG capsule Take 1 capsule (40 mg total) by mouth daily before breakfast.   fentaNYL (DURAGESIC - DOSED MCG/HR) 50 MCG/HR Place 50 mcg onto the skin every 3 (three) days.   flurbiprofen (ANSAID) 100 MG tablet Take 1 tablet (100 mg total) by mouth every 8 (eight) hours as needed (maximum 3 tablets in 24 hours).   glucose blood (FREESTYLE TEST STRIPS) test strip Use to monitor glucose levels 4 times daily; E11.9   HYDROcodone-acetaminophen (NORCO) 7.5-325 MG tablet Take 1 tablet every 6 (six) hours as needed by mouth for moderate pain.   hypromellose (SYSTANE OVERNIGHT THERAPY) 0.3 % GEL ophthalmic ointment Place 1 drop into both eyes at bedtime. Reported on 02/11/2015   Lancets (FREESTYLE) lancets Use to monitor glucose levels 4 times daily; E11.9   lidocaine (LIDODERM) 5 % Place 1 patch onto the skin as needed. Remove & Discard patch within 12 hours. May dispense as 3 month supply   Lifitegrast (XIIDRA) 5 % SOLN Place 1 drop into both eyes daily.    meloxicam (MOBIC) 15 MG tablet Take 1 tablet (15 mg total) by mouth daily. Prn pain   metroNIDAZOLE (METROGEL) 1 % gel  Apply topically daily.   mirabegron ER (MYRBETRIQ) 25 MG TB24 tablet Take 1 tablet (25 mg total) by mouth daily.   mupirocin ointment (BACTROBAN) 2 % Apply 1 application topically 3 (three) times daily.   naloxegol oxalate (MOVANTIK) 25 MG TABS tablet Take 25 mg by mouth daily.   Naloxone HCl 0.4 MG/0.4ML SOAJ Administer 0.44m [place] at first sign of opioid overdose and repeat every 2 minutes as needed for resuscitation. Call 911 immediately   [place] Peripheral Neuropathy Cream- Bupivacaine 1%, Doxepin 3%, Gabapentin 6%, Pentoxifylline 3%, Topiramate 1% Apply 1-2 grams to affected area 3-4 times daily Qty. 120 gm 3 refills   ondansetron (ZOFRAN) 8 MG tablet Take 1 tablet (8 mg total) by mouth every 8 (eight) hours as needed for nausea or vomiting.   pregabalin (LYRICA) 50 MG capsule Take 1 capsule (50 mg total) by mouth 3 (three) times daily. 90 day supply.   Probiotic Product (PROBIOTIC-10 PO) Take by mouth.    sucralfate (CARAFATE) 1 GM/10ML suspension Take 10 mLs (1 g total) by mouth 4 (four) times daily.   SUMAtriptan (IMITREX) 100 MG tablet Take 1 tablet at earliest onset of headache, may repeat in 2 hours if headache persists or reoccurs.   tizanidine (ZANAFLEX) 2 MG capsule TAKE 1 CAPSULE (2 MG TOTAL) BY MOUTH 3 (THREE) TIMES DAILY.   traZODone (DESYREL) 100 MG tablet Take 0.5-1 tablets (50-100 mg total) by mouth at bedtime as needed for sleep. And 1/2 -1 if wakes at 4am.   valACYclovir (VALTREX) 1000 MG tablet Take 1 tablet (1,000 mg total) by mouth 3 (three) times daily.   Vitamin D, Ergocalciferol, (DRISDOL) 1.25 MG (50000 UT) CAPS capsule Take 1 capsule (50,000 Units total) by mouth every 7 (seven) days.     Allergies:   Ambien [zolpidem tartrate], Codeine, Oxycodone-acetaminophen, Tylox [oxycodone-acetaminophen], Darvocet [propoxyphene n-acetaminophen], Darvon [propoxyphene], Lorcet [hydrocodone-acetaminophen], Opium,  Vicodin  [hydrocodone-acetaminophen], Wheat bran, Amoxicillin, and Penicillins   Social History   Socioeconomic History   Marital status: Married    Spouse name: Rushie Chestnut.   Number of children: 2   Years of education: Not on file   Highest education level: Not on file  Occupational History   Occupation: Disability    Employer: RETIRED  Social Designer, fashion/clothing strain: Not on file   Food insecurity    Worry: Not on file    Inability: Not on file   Transportation needs    Medical: Not on file    Non-medical: Not on file  Tobacco Use   Smoking status: Never Smoker   Smokeless tobacco: Never Used  Substance and Sexual Activity   Alcohol use: No   Drug use: No   Sexual activity: Not Currently  Lifestyle   Physical activity    Days per week: Not on file    Minutes per session: Not on file   Stress: Not on file  Relationships   Social connections    Talks on phone: Not on file    Gets together: Not on file    Attends religious service: Not on file    Active member of club or organization: Not on file    Attends meetings of clubs or organizations: Not on file    Relationship status: Not on file  Other Topics Concern   Not on file  Social History Narrative   She retired on disability   Married   2 Children     Family History: The patient's family history includes Breast cancer in her sister; Clotting disorder in her sister; Colon cancer in her maternal aunt, paternal aunt, and paternal aunt; Diabetes in her mother; Heart disease in her father, mother, sister, and another family member; Hypertension in her father; Kidney disease in her father and sister; Lung cancer in her father, mother, and sister; Pancreatic cancer in her sister; Seizures in her father; Stroke in her father and mother; Uterine cancer in an other family member. There is no history of Esophageal cancer, Rectal cancer, or Stomach cancer. ROS:   Please see the history of present illness.      All other systems reviewed and are negative.   EKG:  EKG is ordered today.  The ekg ordered today demonstrates NSR without ST/T changes  Recent Labs: 07/06/2018: ALT 22; BUN 19; Creatinine, Ser 0.68; Hemoglobin 12.1; Platelets 277; Potassium 4.9; Sodium 143   Recent Lipid Panel    Component Value Date/Time   CHOL 189 12/29/2017 1258   TRIG 87 12/29/2017 1258   HDL 80 12/29/2017 1258   CHOLHDL 2.4 12/29/2017 1258   LDLCALC 92 12/29/2017 1258    Physical Exam:    VS:  BP (!) 102/52    Pulse 83    Ht _0  (1.651 m)    Wt 102 lb 1.9 oz (46.3 kg)    SpO2 98%    BMI 16.99 kg/m     Wt Readings from Last 6 Encounters:  10/30/18 102 lb 1.9 oz (46.3 kg)  10/22/18 105 lb (47.6 kg)  10/01/18 105 lb (47.6 kg)  08/31/18 100 lb (45.4 kg)  07/06/18 107 lb 6.4 oz (48.7 kg)  07/01/18 98 lb (44.5 kg)     Physical Exam  Constitutional: She is oriented to person, place, and time. No distress.  Very thin female, using an Transport planner.  HENT:  Head: Normocephalic and atraumatic.  Neck: Normal range of  motion. Neck supple. No JVD present.  Cardiovascular: Normal rate, regular rhythm, normal heart sounds and intact distal pulses. Exam reveals no gallop and no friction rub.  No murmur heard. Pulmonary/Chest: Effort normal and breath sounds normal. No respiratory distress. She has no wheezes. She has no rales.  Abdominal: Soft. Bowel sounds are normal.  Musculoskeletal: Normal range of motion.        General: No edema.  Neurological: She is alert and oriented to person, place, and time.  Skin: Skin is warm and dry.  Psychiatric: She has a normal mood and affect. Her behavior is normal. Judgment and thought content normal.  Vitals reviewed.    ASSESSMENT:    1. Preop cardiovascular exam   2. MVP (mitral valve prolapse)   3. Palpitations   4. WEIGHT LOSS    PLAN:    In order of problems listed above:  Preoperative assessment -The patient has a history of mitral valve prolapse,  noncardiac chest pain and palpitations.  No prior history of heart failure or ischemic cardiac disease. -She is not very active due to peripheral neuropathy. -According to the revised cardiac risk index the patient is a class I risk with 0.4% risk of major cardiac event perioperatively which is average.  She likely does have some increased risk due to her malnutrition and low functional status. -She is cleared for the planned procedure from a cardiac standpoint with no need for further cardiac studies.  Mitral valve prolapse -Trivial mitral regurgitation by echo in 07/2014 with normal LV systolic function and wall motion. -Valve has been very stable over time.  -No murmur noted on exam.  Palpitations -Patient has not had recent palpitations.  No longer needing beta-blocker.  Malnutrition/wt loss -Body mass index is 16.99 kg/m. Total protein is 6.1 (low normal), Albumin 4.4 (nl).  -Patient has multiple issues contributing to her malnutrition.  She states that all of her diabetes is diet controlled, her body is very sensitive to carbs.  Limiting carbs leads to inability to maintain weight.  She also has had dysphagia and had esophageal dilatation last week which she hopes will help.  Another issue is that she is unable to prepare her own meals, relying on her husband, who she reports is not reliable. -The patient is followed by her PCP and states that the nurse provides her with nutritional education and support.  With her inability to make meals and her husband's lack of reliability, I wonder if she could get Meals on Wheels.  I am looking into a cardiac program that was initiated during the pandemic for delivery of meals to cardiac patients.  I am not sure if it is still in effect but if it is I will request it for her.  I will efax this clearance note to the requesting provider.    Medication Adjustments/Labs and Tests Ordered: Current medicines are reviewed at length with the patient today.   Concerns regarding medicines are outlined above. Labs and tests ordered and medication changes are outlined in the patient instructions below:  Patient Instructions  Medication Instructions:  Your physician recommends that you continue on your current medications as directed. Please refer to the Current Medication list given to you today.  *If you need a refill on your cardiac medications before your next appointment, please call your pharmacy*  Lab Work: None   If you have labs (blood work) drawn today and your tests are completely normal, you will receive your results only by:  [employer] (  if you have MyChart) OR  A paper copy in the mail If you have any lab test that is abnormal or we need to change your treatment, we will call you to review the results.  Testing/Procedures: None   Follow-Up: At [facility], you and your health needs are our priority.  As part of our continuing mission to provide you with exceptional heart care, we have created designated Provider Care Teams.  These Care Teams include your primary Cardiologist (physician) and Advanced Practice Providers (APPs -  Physician Assistants and Nurse Practitioners) who all work together to provide you with the care you need, when you need it.  Your next appointment:   6 months  The format for your next appointment:   Either In Person or Virtual  Provider:   You may see Mertie Moores, MD  or one of the following Advanced Practice Providers on your designated Care Team:    Richardson Dopp, PA-C  [place], [place]  Daune Perch, NP   Other Instructions      Signed, Daune Perch, NP  10/30/2018 2:04 PM    [place]

## 2018-10-30 NOTE — Patient Instructions (Signed)
Medication Instructions:  Your physician recommends that you continue on your current medications as directed. Please refer to the Current Medication list given to you today.  *If you need a refill on your cardiac medications before your next appointment, please call your pharmacy*  Lab Work: None   If you have labs (blood work) drawn today and your tests are completely normal, you will receive your results only by: [facility] MyChart Message (if you have MyChart) OR . A paper copy in the mail If you have any lab test that is abnormal or we need to change your treatment, we will call you to review the results.  Testing/Procedures: None   Follow-Up: At [facility], you and your health needs are our priority.  As part of our continuing mission to provide you with exceptional heart care, we have created designated Provider Care Teams.  These Care Teams include your primary Cardiologist (physician) and Advanced Practice Providers (APPs -  Physician Assistants and Nurse Practitioners) who all work together to provide you with the care you need, when you need it.  Your next appointment:   6 months  The format for your next appointment:   Either In Person or Virtual  Provider:   You may see Mertie Moores, MD  or one of the following Advanced Practice Providers on your designated Care Team:    Richardson Dopp, PA-C  [place], [place]  Daune Perch, NP   Other Instructions

## 2018-11-03 ENCOUNTER — Telehealth: Payer: Self-pay | Admitting: Family Medicine

## 2018-11-03 ENCOUNTER — Telehealth: Payer: Self-pay | Admitting: Cardiology

## 2018-11-03 ENCOUNTER — Telehealth: Payer: Self-pay | Admitting: Neurology

## 2018-11-03 NOTE — Telephone Encounter (Signed)
Follow Up:     Pt is calling to check on the status of her clearance. She said her surgeon's office said they had not received it. If you have any questions, please call Dianna at [phone].

## 2018-11-03 NOTE — Telephone Encounter (Signed)
Completed.

## 2018-11-03 NOTE — Telephone Encounter (Signed)
Did not see forms in provider mail box Have you seen this patient form?  Pt was on aspirin but has stop due to surgery.

## 2018-11-03 NOTE — Telephone Encounter (Signed)
Will check provider mail box for form.

## 2018-11-03 NOTE — Telephone Encounter (Signed)
Surgery clearance form was faxed to our office for patient's back surgery spinal nerve implant[facility]. She was calling to check in on if the form had been completed. Surgery on 11/16/18. Thanks!

## 2018-11-03 NOTE — Telephone Encounter (Signed)
° °  Pt is having back surgery and the office faxed over a surgical clearance form last week and need that form back today or they will cancel her surgery   Dr Patrice Paradise is the doctor

## 2018-11-03 NOTE — Telephone Encounter (Signed)
I have reviewed Kayla [name]'s cardiac exam and pre op cardiac evaluation. It has been faxed to [facility].

## 2018-11-04 NOTE — Telephone Encounter (Signed)
Paperwork signed and faxed.

## 2018-11-06 DIAGNOSIS — G894 Chronic pain syndrome: Secondary | ICD-10-CM | POA: Diagnosis not present

## 2018-11-09 DIAGNOSIS — E1143 Type 2 diabetes mellitus with diabetic autonomic (poly)neuropathy: Secondary | ICD-10-CM | POA: Diagnosis not present

## 2018-11-10 DIAGNOSIS — Z20828 Contact with and (suspected) exposure to other viral communicable diseases: Secondary | ICD-10-CM | POA: Diagnosis not present

## 2018-11-10 DIAGNOSIS — M47812 Spondylosis without myelopathy or radiculopathy, cervical region: Secondary | ICD-10-CM | POA: Diagnosis not present

## 2018-11-10 DIAGNOSIS — Z01812 Encounter for preprocedural laboratory examination: Secondary | ICD-10-CM | POA: Diagnosis not present

## 2018-11-12 DIAGNOSIS — M47812 Spondylosis without myelopathy or radiculopathy, cervical region: Secondary | ICD-10-CM | POA: Diagnosis not present

## 2018-11-12 DIAGNOSIS — G629 Polyneuropathy, unspecified: Secondary | ICD-10-CM | POA: Diagnosis not present

## 2018-11-12 DIAGNOSIS — G894 Chronic pain syndrome: Secondary | ICD-10-CM | POA: Diagnosis not present

## 2018-11-16 DIAGNOSIS — G894 Chronic pain syndrome: Secondary | ICD-10-CM | POA: Diagnosis not present

## 2018-11-16 DIAGNOSIS — R109 Unspecified abdominal pain: Secondary | ICD-10-CM | POA: Diagnosis not present

## 2018-11-16 DIAGNOSIS — E1142 Type 2 diabetes mellitus with diabetic polyneuropathy: Secondary | ICD-10-CM | POA: Diagnosis not present

## 2018-11-16 DIAGNOSIS — T85112A Breakdown (mechanical) of implanted electronic neurostimulator (electrode) of spinal cord, initial encounter: Secondary | ICD-10-CM | POA: Diagnosis not present

## 2018-11-16 DIAGNOSIS — Z9889 Other specified postprocedural states: Secondary | ICD-10-CM | POA: Diagnosis not present

## 2018-11-16 DIAGNOSIS — K828 Other specified diseases of gallbladder: Secondary | ICD-10-CM | POA: Diagnosis not present

## 2018-11-16 DIAGNOSIS — K7689 Other specified diseases of liver: Secondary | ICD-10-CM | POA: Diagnosis not present

## 2018-11-16 DIAGNOSIS — Z969 Presence of functional implant, unspecified: Secondary | ICD-10-CM | POA: Diagnosis not present

## 2018-11-16 DIAGNOSIS — M549 Dorsalgia, unspecified: Secondary | ICD-10-CM | POA: Diagnosis not present

## 2018-11-16 HISTORY — DX: Breakdown (mechanical) of implanted electronic neurostimulator of spinal cord electrode (lead), initial encounter: T85.112A

## 2018-11-17 DIAGNOSIS — Z9889 Other specified postprocedural states: Secondary | ICD-10-CM | POA: Diagnosis not present

## 2018-11-17 DIAGNOSIS — R109 Unspecified abdominal pain: Secondary | ICD-10-CM | POA: Diagnosis not present

## 2018-11-17 DIAGNOSIS — T85112A Breakdown (mechanical) of implanted electronic neurostimulator (electrode) of spinal cord, initial encounter: Secondary | ICD-10-CM | POA: Diagnosis not present

## 2018-11-17 DIAGNOSIS — M549 Dorsalgia, unspecified: Secondary | ICD-10-CM | POA: Diagnosis not present

## 2018-11-17 DIAGNOSIS — E1142 Type 2 diabetes mellitus with diabetic polyneuropathy: Secondary | ICD-10-CM | POA: Diagnosis not present

## 2018-11-17 DIAGNOSIS — Z969 Presence of functional implant, unspecified: Secondary | ICD-10-CM | POA: Diagnosis not present

## 2018-11-17 DIAGNOSIS — G894 Chronic pain syndrome: Secondary | ICD-10-CM | POA: Diagnosis not present

## 2018-11-18 DIAGNOSIS — T85112A Breakdown (mechanical) of implanted electronic neurostimulator (electrode) of spinal cord, initial encounter: Secondary | ICD-10-CM | POA: Diagnosis not present

## 2018-11-18 DIAGNOSIS — M549 Dorsalgia, unspecified: Secondary | ICD-10-CM | POA: Diagnosis not present

## 2018-11-18 DIAGNOSIS — G894 Chronic pain syndrome: Secondary | ICD-10-CM | POA: Diagnosis not present

## 2018-11-18 DIAGNOSIS — E1142 Type 2 diabetes mellitus with diabetic polyneuropathy: Secondary | ICD-10-CM | POA: Diagnosis not present

## 2018-11-18 DIAGNOSIS — R109 Unspecified abdominal pain: Secondary | ICD-10-CM | POA: Diagnosis not present

## 2018-11-18 DIAGNOSIS — Z9889 Other specified postprocedural states: Secondary | ICD-10-CM | POA: Diagnosis not present

## 2018-12-04 DIAGNOSIS — E559 Vitamin D deficiency, unspecified: Secondary | ICD-10-CM | POA: Diagnosis not present

## 2018-12-04 DIAGNOSIS — E672 Megavitamin-B6 syndrome: Secondary | ICD-10-CM | POA: Diagnosis not present

## 2018-12-04 DIAGNOSIS — E1143 Type 2 diabetes mellitus with diabetic autonomic (poly)neuropathy: Secondary | ICD-10-CM | POA: Diagnosis not present

## 2018-12-04 DIAGNOSIS — G6182 Multifocal motor neuropathy: Secondary | ICD-10-CM | POA: Diagnosis not present

## 2018-12-04 DIAGNOSIS — G603 Idiopathic progressive neuropathy: Secondary | ICD-10-CM | POA: Diagnosis not present

## 2018-12-15 ENCOUNTER — Encounter: Payer: Self-pay | Admitting: Registered Nurse

## 2018-12-15 ENCOUNTER — Ambulatory Visit (INDEPENDENT_AMBULATORY_CARE_PROVIDER_SITE_OTHER): Payer: Medicare Other | Admitting: Registered Nurse

## 2018-12-15 ENCOUNTER — Other Ambulatory Visit: Payer: Self-pay

## 2018-12-15 VITALS — BP 108/60 | HR 101 | Temp 97.9°F | Resp 12 | Ht 65.0 in | Wt 104.0 lb

## 2018-12-15 DIAGNOSIS — R109 Unspecified abdominal pain: Secondary | ICD-10-CM

## 2018-12-15 DIAGNOSIS — R82998 Other abnormal findings in urine: Secondary | ICD-10-CM | POA: Diagnosis not present

## 2018-12-15 DIAGNOSIS — R300 Dysuria: Secondary | ICD-10-CM

## 2018-12-15 LAB — POCT URINALYSIS DIP (CLINITEK)
Bilirubin, UA: NEGATIVE
Blood, UA: NEGATIVE
Glucose, UA: NEGATIVE mg/dL
Ketones, POC UA: NEGATIVE mg/dL
Leukocytes, UA: NEGATIVE
Nitrite, UA: NEGATIVE
POC PROTEIN,UA: NEGATIVE
Spec Grav, UA: >=1.03 — AB (ref 1.010–1.025)
Urobilinogen, UA: 0.2 E.U./dL
pH, UA: 6 (ref 5.0–8.0)

## 2018-12-15 MED ORDER — PHENAZOPYRIDINE HCL 95 MG PO TABS: 95.0000 mg | ORAL_TABLET | Freq: Three times a day (TID) | ORAL | 0 refills | Status: DC | PRN

## 2018-12-15 MED ORDER — SULFAMETHOXAZOLE-TRIMETHOPRIM 800-160 MG PO TABS: 1.0000 | ORAL_TABLET | Freq: Two times a day (BID) | ORAL | 0 refills | Status: DC

## 2018-12-15 NOTE — Patient Instructions (Signed)
° ° ° °  If you have lab work done today you will be contacted with your lab results within the next 2 weeks.  If you have not heard from us then please contact us. The fastest way to get your results is to register for My Chart. ° ° °IF you received an x-ray today, you will receive an invoice from [facility]. Please contact [facility] at [phone] with questions or concerns regarding your invoice.  ° °IF you received labwork today, you will receive an invoice from [facility]. Please contact [facility] at [phone] with questions or concerns regarding your invoice.  ° °Our billing staff will not be able to assist you with questions regarding bills from these companies. ° °You will be contacted with the lab results as soon as they are available. The fastest way to get your results is to activate your My Chart account. Instructions are located on the last page of this paperwork. If you have not heard from us regarding the results in 2 weeks, please contact this office. °  ° ° ° °

## 2018-12-15 NOTE — Progress Notes (Signed)
Acute Office Visit  Subjective:    Patient ID: ARLEIGH DICOLA, female    DOB: [birth year], 73 y.o.   MRN: [id number]  Chief Complaint  Patient presents with  . Back Pain    pt stated lower back right side with constant painful--over 1 week. denied fever. Pt had lab done by shaw.    HPI Patient is in today for Lower left back pain  Onset was 1-2 weeks ago. Was seen by Dr. Brigitte Pulse at [facility] comprehensive care, who collected labs - she had a negative culture, but urinalysis revealed leuks, ketones, and bilirubin.  She has noticed a foul odor with her urine - this is extremely abnormal for her. She also notes that this back pain has increased in intensity. She received back surgery in November and is still using fentanyl and Vicodin for her pain, however, they do not have any effect on this pain. No systemic signs of infection including fever, chills, fatigue, nausea, vomiting, diarrhea.    Past Medical History:  Diagnosis Date  . Allergy   . Anal fissure 10/08/2016  . Anemia    anemia in the past  . Angina    takes Propanolol prn and it stops her chest  . Anxiety   . Barrett esophagus    not consistently present  . Cataract   . Chronic kidney disease    kidney stones and UTI  . Complication of anesthesia    either  BP or pulse dropped with last surgery  . DDD (degenerative disc disease)    cervical and lumbar  . Dementia ([place])   . Depression    post traumatic stress  disorder  . Diabetes ([place]) 11/18/2016  . Diabetes mellitus    sugar goes up and down diet controlled  . Dysrhythmia    brought on by stress  . External hemorrhoids   . Fatigue   . Fibromyalgia    neuropathy knees ankles and toes, bladder  . GERD (gastroesophageal reflux disease)   . H/O hyperparathyroidism   . Headache(784.0)    migraines  . Hiatal hernia   . History of kidney stones   . Hypertension    3 small brain aneurysms  . Hypoglycemia 11/08/2016  . IBS (irritable bowel syndrome)   . Internal  hemorrhoids   . Macular degeneration   . Migraines   . Mitral regurgitation 12/11/2015  . Osteoarthritis    all over  . Osteopetrosis   . Peripheral vascular disease (HCC)    superficial phlebitis in left calf  . Personal history of colonic polyps 07/22/2013   07/2013 - 3 small adenomas - repeat colonoscopy 2018  . Small intestinal bacterial overgrowth 08/05/2017   + lactulose breath test 06/2017 Xifaxan Rx    Past Surgical History:  Procedure Laterality Date  . ABDOMINAL HYSTERECTOMY    . APPENDECTOMY    . CARDIAC CATHETERIZATION  03/22/1991   EF 61%  . CARDIOVASCULAR STRESS TEST  10/03/2006  . CHOLECYSTECTOMY    . COLONOSCOPY  06/23/2008   normal  . DILATION AND CURETTAGE OF UTERUS     x2  . ESOPHAGUS SURGERY    . EXPLORATORY LAPAROTOMY  1993  . EYE SURGERY    . GASTRIC BYPASS  1999  . GASTRIC RESTRICTION SURGERY  1991  . LASIK Bilateral   . Right Arm Surgery    . Right Knee Arthroscopy    . Rt wrist fx  2009  . stomach stappeling  1991  . STONE EXTRACTION WITH  BASKET  2012  . TONSILLECTOMY    . TUBAL LIGATION    . UPPER GASTROINTESTINAL ENDOSCOPY  06/23/2008   w/Dil, Barrett's esophagus  . US ECHOCARDIOGRAPHY  01/21/2007   EF 55-60%  . US ECHOCARDIOGRAPHY  08/30/2003   EF 55-60%    Family History  Problem Relation Age of Onset  . Stroke Mother   . Lung cancer Mother   . Heart disease Mother   . Diabetes Mother   . Hypertension Father   . Stroke Father   . Lung cancer Father   . Heart disease Father   . Kidney disease Father   . Seizures Father        epilepsy, and sisters x 2  . Pancreatic cancer Sister   . Lung cancer Sister   . Colon cancer Maternal Aunt        12 relatives  . Uterine cancer Other        aunt  . Heart disease Other        grandmother  . Clotting disorder Sister   . Heart disease Sister        x 2  . Kidney disease Sister        x 2  . Breast cancer Sister   . Colon cancer Paternal Aunt   . Colon cancer Paternal Aunt   . Esophageal  cancer Neg Hx   . Rectal cancer Neg Hx   . Stomach cancer Neg Hx     Social History   Socioeconomic History  . Marital status: Married    Spouse name: Rushie Chestnut.  . Number of children: 2  . Years of education: Not on file  . Highest education level: Not on file  Occupational History  . Occupation: Disability    Employer: RETIRED  Social Needs  . Financial resource strain: Not on file  . Food insecurity    Worry: Not on file    Inability: Not on file  . Transportation needs    Medical: Not on file    Non-medical: Not on file  Tobacco Use  . Smoking status: Never Smoker  . Smokeless tobacco: Never Used  Substance and Sexual Activity  . Alcohol use: No  . Drug use: No  . Sexual activity: Not Currently  Lifestyle  . Physical activity    Days per week: Not on file    Minutes per session: Not on file  . Stress: Not on file  Relationships  . Social Herbalist on phone: Not on file    Gets together: Not on file    Attends religious service: Not on file    Active member of club or organization: Not on file    Attends meetings of clubs or organizations: Not on file    Relationship status: Not on file  . Intimate partner violence    Fear of current or ex partner: Not on file    Emotionally abused: Not on file    Physically abused: Not on file    Forced sexual activity: Not on file  Other Topics Concern  . Not on file  Social History Narrative   She retired on disability   Married   2 Children    Outpatient Medications Prior to Visit  Medication Sig Dispense Refill  . aspirin-acetaminophen-caffeine (EXCEDRIN MIGRAINE) 250-250-65 MG tablet Take 2 tablets by mouth 2 (two) times daily.    . Blood Glucose Monitoring Suppl (FREESTYLE FREEDOM LITE) w/Device KIT 1 each by Does not  apply route 4 (four) times daily. Use to monitor glucose levels 4 times daily; E11.9 1 kit 0  . clonazePAM (KLONOPIN) 1 MG tablet Take 0.5-1 tablets (0.5-1 mg total) by mouth 2 (two) times  daily as needed for anxiety. 180 tablet 0  . Continuous Blood Gluc Receiver (FREESTYLE LIBRE 14 DAY READER) DEVI 1 each by Does not apply route See admin instructions. Use with sensors to monitor glucose levels; E11.9 1 each 0  . cyclobenzaprine (FLEXERIL) 10 MG tablet Take 1 tablet (10 mg total) by mouth 3 (three) times daily as needed for muscle spasms. 90 day supply 270 tablet 3  . cycloSPORINE (RESTASIS) 0.05 % ophthalmic emulsion Place 1 drop into both eyes 2 (two) times daily. 5.5 mL 5  . diclofenac sodium (VOLTAREN) 1 % GEL Apply 2 g topically 4 (four) times daily. 700 g 3  . dicyclomine (BENTYL) 10 MG capsule Take 1-2 tab 3 times daily AC as needed for spasms and cramping. 270 capsule 1  . diltiazem 2 % GEL Apply 1 application topically 2 (two) times daily as needed (pain from fissure). 30 g 2  . DULoxetine (CYMBALTA) 30 MG capsule Take 3 capsules (90 mg total) by mouth daily. 90 capsule 5  . Erenumab-aooe (AIMOVIG) 140 MG/ML SOAJ Inject 140 mg into the skin every 30 (thirty) days. 90 day supply 3 pen 3  . esomeprazole (NEXIUM) 40 MG capsule Take 1 capsule (40 mg total) by mouth daily before breakfast. 90 capsule 3  . fentaNYL (DURAGESIC - DOSED MCG/HR) 50 MCG/HR Place 50 mcg onto the skin every 3 (three) days.    . flurbiprofen (ANSAID) 100 MG tablet Take 1 tablet (100 mg total) by mouth every 8 (eight) hours as needed (maximum 3 tablets in 24 hours). 24 tablet 3  . glucose blood (FREESTYLE TEST STRIPS) test strip Use to monitor glucose levels 4 times daily; E11.9 200 each 0  . HYDROcodone-acetaminophen (NORCO) 7.5-325 MG tablet Take 1 tablet every 6 (six) hours as needed by mouth for moderate pain.    . hypromellose (SYSTANE OVERNIGHT THERAPY) 0.3 % GEL ophthalmic ointment Place 1 drop into both eyes at bedtime. Reported on 02/11/2015    . Lancets (FREESTYLE) lancets Use to monitor glucose levels 4 times daily; E11.9 200 each 0  . lidocaine (LIDODERM) 5 % Place 1 patch onto the skin as  needed. Remove & Discard patch within 12 hours. May dispense as 3 month supply 90 patch 3  . Lifitegrast (XIIDRA) 5 % SOLN Place 1 drop into both eyes daily.     . meloxicam (MOBIC) 15 MG tablet Take 1 tablet (15 mg total) by mouth daily. Prn pain 90 tablet 1  . metroNIDAZOLE (METROGEL) 1 % gel Apply topically daily. 45 g 0  . mirabegron ER (MYRBETRIQ) 25 MG TB24 tablet Take 1 tablet (25 mg total) by mouth daily. 90 tablet 1  . mupirocin ointment (BACTROBAN) 2 % Apply 1 application topically 3 (three) times daily. 30 g 1  . naloxegol oxalate (MOVANTIK) 25 MG TABS tablet Take 25 mg by mouth daily.    . Naloxone HCl 0.4 MG/0.4ML SOAJ Administer 0.77m [place] at first sign of opioid overdose and repeat every 2 minutes as needed for resuscitation. Call 911 immediately 5 Package 0  . NON FORMULARY [facility]  Peripheral Neuropathy Cream- Bupivacaine 1%, Doxepin 3%, Gabapentin 6%, Pentoxifylline 3%, Topiramate 1% Apply 1-2 grams to affected area 3-4 times daily Qty. 120 gm 3 refills    . ondansetron (  ZOFRAN) 8 MG tablet Take 1 tablet (8 mg total) by mouth every 8 (eight) hours as needed for nausea or vomiting. 60 tablet 0  . pregabalin (LYRICA) 50 MG capsule Take 1 capsule (50 mg total) by mouth 3 (three) times daily. 90 day supply. 270 capsule 3  . Probiotic Product (PROBIOTIC-10 PO) Take by mouth.     . sucralfate (CARAFATE) 1 GM/10ML suspension Take 10 mLs (1 g total) by mouth 4 (four) times daily. 420 mL 1  . SUMAtriptan (IMITREX) 100 MG tablet Take 1 tablet at earliest onset of headache, may repeat in 2 hours if headache persists or reoccurs. 30 tablet 1  . tizanidine (ZANAFLEX) 2 MG capsule TAKE 1 CAPSULE (2 MG TOTAL) BY MOUTH 3 (THREE) TIMES DAILY.    . traZODone (DESYREL) 100 MG tablet Take 0.5-1 tablets (50-100 mg total) by mouth at bedtime as needed for sleep. And 1/2 -1 if wakes at 4am. 90 tablet 1  . valACYclovir (VALTREX) 1000 MG tablet Take 1 tablet (1,000 mg total) by mouth 3 (three)  times daily. 21 tablet 0  . Vitamin D, Ergocalciferol, (DRISDOL) 1.25 MG (50000 UT) CAPS capsule Take 1 capsule (50,000 Units total) by mouth every 7 (seven) days. 12 capsule 3   No facility-administered medications prior to visit.     Allergies  Allergen Reactions  . Ambien [Zolpidem Tartrate]     Hallucinations  . Codeine Anaphylaxis  . Oxycodone-Acetaminophen Shortness Of Breath  . Tylox [Oxycodone-Acetaminophen] Anaphylaxis    Chest pain  . Darvocet [Propoxyphene N-Acetaminophen]     Chest pain  . Darvon [Propoxyphene] Itching  . Lorcet [Hydrocodone-Acetaminophen]     Chest pain  . Opium     hallucinations  . Vicodin [Hydrocodone-Acetaminophen] Other (See Comments)    Chest Pain   . Wheat Bran   . Amoxicillin Nausea And Vomiting    Reaction:  Migraine headache  . Penicillins Nausea And Vomiting    Migraine Headaches    Review of Systems  Constitutional: Negative.   HENT: Negative.   Eyes: Negative.   Respiratory: Negative.   Cardiovascular: Negative.   Gastrointestinal: Negative.   Genitourinary: Positive for dysuria and flank pain. Negative for frequency, hematuria and urgency.       Foul odor in urine  Musculoskeletal: Positive for back pain.  Skin: Negative.   Neurological: Negative.   Endo/Heme/Allergies: Negative.   Psychiatric/Behavioral: Negative.   All other systems reviewed and are negative.      Objective:    Physical Exam  Constitutional: She is oriented to person, place, and time. She appears well-developed and well-nourished. No distress.  Cardiovascular: Normal rate and regular rhythm.  Pulmonary/Chest: Effort normal. No respiratory distress.      Musculoskeletal:        General: Tenderness (L flank, back) present.  Neurological: She is alert and oriented to person, place, and time.  Skin: Skin is warm and dry. No rash noted. She is not diaphoretic. No erythema. No pallor.  Psychiatric: She has a normal mood and affect. Her behavior is  normal. Judgment and thought content normal.  Nursing note and vitals reviewed.   BP 108/60   Pulse (!) 101   Temp 97.9 F (36.6 C)   Resp 12   Ht '5\' 5"'  (1.651 m)   Wt 104 lb (47.2 kg)   SpO2 98%   BMI 17.31 kg/m  Wt Readings from Last 3 Encounters:  12/15/18 104 lb (47.2 kg)  10/30/18 102 lb 1.9 oz (46.3 kg)  10/22/18 105 lb (47.6 kg)    Health Maintenance Due  Topic Date Due  . HEMOGLOBIN A1C  07/23/2018  . INFLUENZA VACCINE  08/15/2018  . URINE MICROALBUMIN  12/30/2018    There are no preventive care reminders to display for this patient.   Lab Results  Component Value Date   TSH 1.780 03/06/2017   Lab Results  Component Value Date   WBC 8.1 07/06/2018   HGB 12.1 07/06/2018   HCT 35.0 07/06/2018   MCV 85 07/06/2018   PLT 277 07/06/2018   Lab Results  Component Value Date   NA 143 07/06/2018   K 4.9 07/06/2018   CO2 25 07/06/2018   GLUCOSE 78 07/06/2018   BUN 19 07/06/2018   CREATININE 0.68 07/06/2018   BILITOT 0.3 07/06/2018   ALKPHOS 72 07/06/2018   AST 26 07/06/2018   ALT 22 07/06/2018   PROT 6.1 07/06/2018   ALBUMIN 4.4 07/06/2018   CALCIUM 9.5 07/06/2018   ANIONGAP 11 09/12/2016   Lab Results  Component Value Date   CHOL 189 12/29/2017   Lab Results  Component Value Date   HDL 80 12/29/2017   Lab Results  Component Value Date   LDLCALC 92 12/29/2017   Lab Results  Component Value Date   TRIG 87 12/29/2017   Lab Results  Component Value Date   CHOLHDL 2.4 12/29/2017   Lab Results  Component Value Date   HGBA1C 5.1 01/22/2018       Assessment & Plan:   Problem List Items Addressed This Visit    None    Visit Diagnoses    Flank pain    -  Primary   Dysuria       Relevant Orders   POCT URINALYSIS DIP (CLINITEK)   Leukocytes in urine           No orders of the defined types were placed in this encounter.  PLAN  Appears as complicated UTI based on flank pain, however, outpatient treatment appropriate given  patient's overall health and desire to keep her out of the inpatient setting due to COVID-19. Given strict follow up precautions  Bactrim and azo to treat  Return to clinic if symptoms worsen or fail to improve  Patient encouraged to call clinic with any questions, comments, or concerns.    Maximiano Coss, NP

## 2018-12-17 DIAGNOSIS — G894 Chronic pain syndrome: Secondary | ICD-10-CM | POA: Diagnosis not present

## 2018-12-18 DIAGNOSIS — R109 Unspecified abdominal pain: Secondary | ICD-10-CM | POA: Diagnosis not present

## 2018-12-18 DIAGNOSIS — R32 Unspecified urinary incontinence: Secondary | ICD-10-CM | POA: Diagnosis not present

## 2018-12-29 DIAGNOSIS — L988 Other specified disorders of the skin and subcutaneous tissue: Secondary | ICD-10-CM | POA: Diagnosis not present

## 2018-12-29 DIAGNOSIS — L723 Sebaceous cyst: Secondary | ICD-10-CM | POA: Diagnosis not present

## 2018-12-29 DIAGNOSIS — D485 Neoplasm of uncertain behavior of skin: Secondary | ICD-10-CM | POA: Diagnosis not present

## 2018-12-30 ENCOUNTER — Encounter: Payer: Self-pay | Admitting: Neurology

## 2018-12-30 NOTE — Progress Notes (Signed)
PATIENT NOT SEEN.  MADE SEVERAL ATTEMPTS TO CONTACT PATIENT FOR HER VISIT BUT SHE DID NOT RESPOND.   Kayla [name] is a 73 year old woman with fibromyalgia, anxiety with history of PTSD, degenerative disc disease of cervical and lumbar spine, and history of nephrolithiasis and gastric bypass who follows up forpolyneuropathy and migraine.  UPDATE: Chronic pain: She underwent revision of her spinal cord stimulator last month.   Neuropathy: .Pain is still a significant issue. Her pain doctor recently left.  Migraine: Aimovig increased to 140mg . Intensity:  Moderate Duration:  1 hour with sumatriptan Frequency:  Once a week to once every other week Current NSAIDS:Mobic Current analgesics:Excedrin Migraine,Lidoderm, voltaren 1%, Fentanyl patch Current triptans:Sumatriptan 100mg  Current ergotamine:no Current anti-emetic:no Current muscle relaxants:Flexeril Current anti-anxiolytic:clonazepam Current sleep aide:trazodone Current Antihypertensive medications:Lasix Current Antidepressant medications:Cymbalta 90mg  twice daily, Lyrica 50mg  twice daily(three times daily caused confusion) Current Anticonvulsant medications:No Current anti-CGRP:Aimovig 140mg  Current Vitamins/Herbal/Supplements:None Current Antihistamines/Decongestants:no Other therapy:Physical therapy, [facility]  Caffeine:2 cups of coffee daily Alcohol:no Smoker:no Diet:poor Exercise:no Depression:no; Anxiety:yes Other pain:no Sleep hygiene:poor  HISTORY: I Neuropathy: Since July 2018, she has had increased swelling and burning numbness and tingling in the legs. She had lower extremity vascular ABIs on 08/20/16, which was negative. She has history of B12 deficiency but she takes injections and recent B12 level from 09/21/16 was over 2000. Methylmalonic acid level was 209, RPR nonreactive, homocysteine 7.8. Labs from 07/12/16 include normal SPEP, Sed Rate 2, CRP less  than 0.3, and TSH 2.430 . Vitamin D was 23 and she was advised to start supplementation. Serum glucose has been 70s up to 110. She also takes B6. She also has history of weight loss. She has fibromyalgia and was previously diagnosed with neuropathy by another neurologist. She has been on gabapentin for many years. It was briefly discontinued to see if it was contributing to the swelling. She reported no significant change, so it was restarted (she takes 300mg  4 times daily), although she thinks it helped a little bit. NCV-EMG from 10/21/16 demonstrated chronic sensorimotor polyneuropathy of the predominantly axonal type as well as mild left median neuropathy at or distal to the wrist. Other labs include B1 146.1, B6 10.7, Sed Rate 2, HIV negative, TSH 1.780, UPEP negative.  She went to [facility] for evaluation of neuropathy.Selenium, Zinc, paraneoplastic panel and genetic testing Copper was low, so she was advised to start supplement.B6 was still elevated despite stopping supplements 3 prior.  She was wondering if her neuropathy could be secondary to [employer] exposure. Her husband was a [place] veteran and reports cases of spouses exposed to [employer] through their husband's semen. 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 Her neuropathic symptoms started shortly after her return.    II Migraine: Onset: Since 1970, after her twin newborns passed away. She has lived with life-long family-related stress Location: Migraines are unilateral/parietal or bilateral retro-orbital, chronic tension type (bifrontal) Quality: Severe crushing Initial Intensity: 10/10 Aura: no Prodrome: no Associated symptoms: Photophobia, phonophobia. No nausea, vomiting or visual disturbance Initial Duration: Migraines 1 hour with sumatriptan, other headaches 15 minutes with Fioricet Initial Frequency: Daily (3 to 4 days a month are severe migraine) Triggers/exacerbating  factors: Stress, Nexium Relieving factors: Clonazepam, Fioricet, sumatriptan Activity: Cannot function when severe (3-4 days per month)  Past NSAIDS: Ibuprofen, naproxen Past analgesics: Cafergot, Excedrin, Tylenol, Tramadol, Fioricet Past abortive triptans: no Past muscle relaxants:tizanidine Past anti-emetic: Zofran ODT 8mg  Past antihypertensive medications: Lopressor, propranolol Past antidepressant medications: Elavil, Effexor, Zoloft  Past anticonvulsant medications: Topiramate, gabapentin 300mg  twice daily Past vitamins/Herbal/Supplements:CoQ10 Other past therapies: yoga  Family history of headache: Father, sister, brother  MRI and MRA of brain from 07/23/11 were personally reviewed and unremarkable.

## 2018-12-31 ENCOUNTER — Telehealth (INDEPENDENT_AMBULATORY_CARE_PROVIDER_SITE_OTHER): Payer: Medicare Other | Admitting: Neurology

## 2018-12-31 ENCOUNTER — Other Ambulatory Visit: Payer: Self-pay

## 2019-01-18 DIAGNOSIS — D22111 Melanocytic nevi of right upper eyelid, including canthus: Secondary | ICD-10-CM | POA: Diagnosis not present

## 2019-01-18 DIAGNOSIS — D3611 Benign neoplasm of peripheral nerves and autonomic nervous system of face, head, and neck: Secondary | ICD-10-CM | POA: Diagnosis not present

## 2019-01-18 DIAGNOSIS — D485 Neoplasm of uncertain behavior of skin: Secondary | ICD-10-CM | POA: Diagnosis not present

## 2019-01-19 DIAGNOSIS — H35363 Drusen (degenerative) of macula, bilateral: Secondary | ICD-10-CM | POA: Diagnosis not present

## 2019-01-19 DIAGNOSIS — H43812 Vitreous degeneration, left eye: Secondary | ICD-10-CM | POA: Diagnosis not present

## 2019-01-20 ENCOUNTER — Telehealth: Payer: Self-pay

## 2019-01-21 NOTE — Telephone Encounter (Signed)
Made in error

## 2019-02-02 ENCOUNTER — Ambulatory Visit (INDEPENDENT_AMBULATORY_CARE_PROVIDER_SITE_OTHER): Payer: Medicare Other | Admitting: Endocrinology

## 2019-02-02 ENCOUNTER — Other Ambulatory Visit: Payer: Self-pay

## 2019-02-02 ENCOUNTER — Encounter: Payer: Self-pay | Admitting: Endocrinology

## 2019-02-02 ENCOUNTER — Telehealth: Payer: Self-pay | Admitting: Endocrinology

## 2019-02-02 VITALS — BP 120/70 | HR 105 | Ht 65.0 in | Wt 104.0 lb

## 2019-02-02 DIAGNOSIS — E11649 Type 2 diabetes mellitus with hypoglycemia without coma: Secondary | ICD-10-CM

## 2019-02-02 LAB — POCT GLYCOSYLATED HEMOGLOBIN (HGB A1C): Hemoglobin A1C: 5.2 % (ref 4.0–5.6)

## 2019-02-02 MED ORDER — ACARBOSE 25 MG PO TABS: 25.0000 mg | ORAL_TABLET | Freq: Three times a day (TID) | ORAL | 3 refills | Status: DC

## 2019-02-02 NOTE — Telephone Encounter (Signed)
Patient re: Patient's new PCP is Laurey Arrow. Brigitte Pulse located at [facility] [place], [place]. Ph# [phone]. Also patient requests that report from today's visit with Dr. Loanne Drilling be sent to patient's PCP listed above.

## 2019-02-02 NOTE — Progress Notes (Signed)
Subjective:    Patient ID: Kayla [name], female    DOB: 1945-10-26, 74 y.o.   MRN: [id number]  HPI Pt returns for f/u of reactive hypoglycemia (she had gastric bypass surgery in 1999, but she reports intermittent hypoglycemia since 1979; no other cause was found; she takes acarbose).  She also reports ongoing pain at both feet.  I reviewed continuous glucose monitor data today.  Glucose varies from 75-100.  However, pt says cbg has been as low as 53. The only time of day checked is fasting.   Past Medical History:  Diagnosis Date  . Allergy   . Anal fissure 10/08/2016  . Anemia    anemia in the past  . Angina    takes Propanolol prn and it stops her chest  . Anxiety   . Barrett esophagus    not consistently present  . Cataract   . Chronic kidney disease    kidney stones and UTI  . Complication of anesthesia    either  BP or pulse dropped with last surgery  . DDD (degenerative disc disease)    cervical and lumbar  . Dementia ([place])   . Depression    post traumatic stress  disorder  . Diabetes ([place]) 11/18/2016  . Diabetes mellitus    sugar goes up and down diet controlled  . Dysrhythmia    brought on by stress  . External hemorrhoids   . Fatigue   . Fibromyalgia    neuropathy knees ankles and toes, bladder  . GERD (gastroesophageal reflux disease)   . H/O hyperparathyroidism   . Headache(784.0)    migraines  . Hiatal hernia   . History of kidney stones   . Hypertension    3 small brain aneurysms  . Hypoglycemia 11/08/2016  . IBS (irritable bowel syndrome)   . Internal hemorrhoids   . Macular degeneration   . Migraines   . Mitral regurgitation 12/11/2015  . Osteoarthritis    all over  . Osteopetrosis   . Peripheral vascular disease ([facility])    superficial phlebitis in left calf  . Personal history of colonic polyps 07/22/2013   07/2013 - 3 small adenomas - repeat colonoscopy 2018  . Small intestinal bacterial overgrowth 08/05/2017   + lactulose breath test 06/2017  Xifaxan Rx    Past Surgical History:  Procedure Laterality Date  . ABDOMINAL HYSTERECTOMY    . APPENDECTOMY    . CARDIAC CATHETERIZATION  03/22/1991   EF 61%  . CARDIOVASCULAR STRESS TEST  10/03/2006  . CHOLECYSTECTOMY    . COLONOSCOPY  06/23/2008   normal  . DILATION AND CURETTAGE OF UTERUS     x2  . ESOPHAGUS SURGERY    . EXPLORATORY LAPAROTOMY  1993  . EYE SURGERY    . GASTRIC BYPASS  1999  . GASTRIC RESTRICTION SURGERY  1991  . LASIK Bilateral   . Right Arm Surgery    . Right Knee Arthroscopy    . Rt wrist fx  2009  . spinal surgery battery implant    . stomach stappeling  1991  . STONE EXTRACTION WITH BASKET  2012  . TONSILLECTOMY    . TUBAL LIGATION    . UPPER GASTROINTESTINAL ENDOSCOPY  06/23/2008   w/Dil, Barrett's esophagus  . US ECHOCARDIOGRAPHY  01/21/2007   EF 55-60%  . US ECHOCARDIOGRAPHY  08/30/2003   EF 55-60%    Social History   Socioeconomic History  . Marital status: Married    Spouse name: Rushie Chestnut.  .  Number of children: 2  . Years of education: Not on file  . Highest education level: Not on file  Occupational History  . Occupation: Disability    Employer: RETIRED  Tobacco Use  . Smoking status: Never Smoker  . Smokeless tobacco: Never Used  Substance and Sexual Activity  . Alcohol use: No  . Drug use: No  . Sexual activity: Not Currently  Other Topics Concern  . Not on file  Social History Narrative   Right handed   Two story home   She retired on disability   Married   2 Children   Social Determinants of Health   Financial Resource Strain:   . Difficulty of Paying Living Expenses: Not on file  Food Insecurity:   . Worried About Charity fundraiser in the Last Year: Not on file  . Ran Out of Food in the Last Year: Not on file  Transportation Needs:   . Lack of Transportation (Medical): Not on file  . Lack of Transportation (Non-Medical): Not on file  Physical Activity:   . Days of Exercise per Week: Not on file  . Minutes of  Exercise per Session: Not on file  Stress:   . Feeling of Stress : Not on file  Social Connections:   . Frequency of Communication with Friends and Family: Not on file  . Frequency of Social Gatherings with Friends and Family: Not on file  . Attends Religious Services: Not on file  . Active Member of Clubs or Organizations: Not on file  . Attends Archivist Meetings: Not on file  . Marital Status: Not on file  Intimate Partner Violence:   . Fear of Current or Ex-Partner: Not on file  . Emotionally Abused: Not on file  . Physically Abused: Not on file  . Sexually Abused: Not on file    Current Outpatient Medications on File Prior to Visit  Medication Sig Dispense Refill  . aspirin-acetaminophen-caffeine (EXCEDRIN MIGRAINE) 250-250-65 MG tablet Take 2 tablets by mouth 2 (two) times daily.    . Blood Glucose Monitoring Suppl (FREESTYLE FREEDOM LITE) w/Device KIT 1 each by Does not apply route 4 (four) times daily. Use to monitor glucose levels 4 times daily; E11.9 1 kit 0  . clonazePAM (KLONOPIN) 1 MG tablet Take 0.5-1 tablets (0.5-1 mg total) by mouth 2 (two) times daily as needed for anxiety. 180 tablet 0  . Continuous Blood Gluc Receiver (FREESTYLE LIBRE 14 DAY READER) DEVI 1 each by Does not apply route See admin instructions. Use with sensors to monitor glucose levels; E11.9 1 each 0  . cyclobenzaprine (FLEXERIL) 10 MG tablet Take 1 tablet (10 mg total) by mouth 3 (three) times daily as needed for muscle spasms. 90 day supply 270 tablet 3  . cycloSPORINE (RESTASIS) 0.05 % ophthalmic emulsion Place 1 drop into both eyes 2 (two) times daily. 5.5 mL 5  . diclofenac sodium (VOLTAREN) 1 % GEL Apply 2 g topically 4 (four) times daily. 700 g 3  . dicyclomine (BENTYL) 10 MG capsule Take 1-2 tab 3 times daily AC as needed for spasms and cramping. 270 capsule 1  . diltiazem 2 % GEL Apply 1 application topically 2 (two) times daily as needed (pain from fissure). 30 g 2  . DULoxetine  (CYMBALTA) 30 MG capsule Take 3 capsules (90 mg total) by mouth daily. 90 capsule 5  . Erenumab-aooe (AIMOVIG) 140 MG/ML SOAJ Inject 140 mg into the skin every 30 (thirty) days. 90 day supply  3 pen 3  . esomeprazole (NEXIUM) 40 MG capsule Take 1 capsule (40 mg total) by mouth daily before breakfast. 90 capsule 3  . fentaNYL (DURAGESIC - DOSED MCG/HR) 50 MCG/HR Place 50 mcg onto the skin every 3 (three) days.    . flurbiprofen (ANSAID) 100 MG tablet Take 1 tablet (100 mg total) by mouth every 8 (eight) hours as needed (maximum 3 tablets in 24 hours). 24 tablet 3  . glucose blood (FREESTYLE TEST STRIPS) test strip Use to monitor glucose levels 4 times daily; E11.9 200 each 0  . HYDROcodone-acetaminophen (NORCO) 7.5-325 MG tablet Take 1 tablet every 6 (six) hours as needed by mouth for moderate pain.    . hypromellose (SYSTANE OVERNIGHT THERAPY) 0.3 % GEL ophthalmic ointment Place 1 drop into both eyes at bedtime. Reported on 02/11/2015    . Lancets (FREESTYLE) lancets Use to monitor glucose levels 4 times daily; E11.9 200 each 0  . lidocaine (LIDODERM) 5 % Place 1 patch onto the skin as needed. Remove & Discard patch within 12 hours. May dispense as 3 month supply 90 patch 3  . Lifitegrast (XIIDRA) 5 % SOLN Place 1 drop into both eyes daily.     . meloxicam (MOBIC) 15 MG tablet Take 1 tablet (15 mg total) by mouth daily. Prn pain 90 tablet 1  . metroNIDAZOLE (METROGEL) 1 % gel Apply topically daily. 45 g 0  . mirabegron ER (MYRBETRIQ) 25 MG TB24 tablet Take 1 tablet (25 mg total) by mouth daily. 90 tablet 1  . mupirocin ointment (BACTROBAN) 2 % Apply 1 application topically 3 (three) times daily. 30 g 1  . naloxegol oxalate (MOVANTIK) 25 MG TABS tablet Take 25 mg by mouth daily.    . Naloxone HCl 0.4 MG/0.4ML SOAJ Administer 0.62m [place] at first sign of opioid overdose and repeat every 2 minutes as needed for resuscitation. Call 911 immediately 5 Package 0  . NON FORMULARY [facility]  Peripheral  Neuropathy Cream- Bupivacaine 1%, Doxepin 3%, Gabapentin 6%, Pentoxifylline 3%, Topiramate 1% Apply 1-2 grams to affected area 3-4 times daily Qty. 120 gm 3 refills    . ondansetron (ZOFRAN) 8 MG tablet Take 1 tablet (8 mg total) by mouth every 8 (eight) hours as needed for nausea or vomiting. 60 tablet 0  . pregabalin (LYRICA) 50 MG capsule Take 1 capsule (50 mg total) by mouth 3 (three) times daily. 90 day supply. (Patient taking differently: Take 50 mg by mouth 2 (two) times daily. 90 day supply.) 270 capsule 3  . Probiotic Product (PROBIOTIC-10 PO) Take by mouth.     . sucralfate (CARAFATE) 1 GM/10ML suspension Take 10 mLs (1 g total) by mouth 4 (four) times daily. 420 mL 1  . SUMAtriptan (IMITREX) 100 MG tablet Take 1 tablet at earliest onset of headache, may repeat in 2 hours if headache persists or reoccurs. 30 tablet 1  . tizanidine (ZANAFLEX) 2 MG capsule TAKE 1 CAPSULE (2 MG TOTAL) BY MOUTH 3 (THREE) TIMES DAILY.    . traZODone (DESYREL) 100 MG tablet Take 0.5-1 tablets (50-100 mg total) by mouth at bedtime as needed for sleep. And 1/2 -1 if wakes at 4am. 90 tablet 1  . valACYclovir (VALTREX) 1000 MG tablet Take 1 tablet (1,000 mg total) by mouth 3 (three) times daily. 21 tablet 0  . Vitamin D, Ergocalciferol, (DRISDOL) 1.25 MG (50000 UT) CAPS capsule Take 1 capsule (50,000 Units total) by mouth every 7 (seven) days. 12 capsule 3   No current  facility-administered medications on file prior to visit.    Allergies  Allergen Reactions  . Ambien [Zolpidem Tartrate]     Hallucinations  . Codeine Anaphylaxis  . Oxycodone-Acetaminophen Shortness Of Breath  . Tylox [Oxycodone-Acetaminophen] Anaphylaxis    Chest pain  . Darvocet [Propoxyphene N-Acetaminophen]     Chest pain  . Darvon [Propoxyphene] Itching  . Lorcet [Hydrocodone-Acetaminophen]     Chest pain  . Opium     hallucinations  . Vicodin [Hydrocodone-Acetaminophen] Other (See Comments)    Chest Pain   . Wheat Bran   .  Amoxicillin Nausea And Vomiting    Reaction:  Migraine headache  . Penicillins Nausea And Vomiting    Migraine Headaches    Family History  Problem Relation Age of Onset  . Stroke Mother   . Lung cancer Mother   . Heart disease Mother   . Diabetes Mother   . Hypertension Father   . Stroke Father   . Lung cancer Father   . Heart disease Father   . Kidney disease Father   . Seizures Father        epilepsy, and sisters x 2  . Pancreatic cancer Sister   . Lung cancer Sister   . Colon cancer Maternal Aunt        12 relatives  . Uterine cancer Other        aunt  . Heart disease Other        grandmother  . Clotting disorder Sister   . Heart disease Sister        x 2  . Kidney disease Sister        x 2  . Breast cancer Sister   . Colon cancer Paternal Aunt   . Colon cancer Paternal Aunt   . Esophageal cancer Neg Hx   . Rectal cancer Neg Hx   . Stomach cancer Neg Hx     BP 120/70 (BP Location: Left Arm, Patient Position: Sitting, Cuff Size: Normal)   Pulse (!) 105   Ht '5\' 5"'  (1.651 m)   Wt 104 lb (47.2 kg)   SpO2 99%   BMI 17.31 kg/m    Review of Systems Denies LOC.      Objective:   Physical Exam VITAL SIGNS:  See vs page GENERAL: no distress Pulses: dorsalis pedis intact bilat.   MSK: no deformity of the feet CV: no leg edema Skin:  no ulcer on the feet.  normal color and temp on the feet. Neuro: sensation is intact to touch on the feet.     Lab Results  Component Value Date   HGBA1C 5.2 02/02/2019        Assessment & Plan:  Tachycardia: uncertain etiology.  Hypoglycemia: mild, on rx  Patient Instructions  Please continue the same medication. Please have the thyroid checked at Dr Raul Del office.   Please come back for a follow-up appointment in 6 months.

## 2019-02-02 NOTE — Patient Instructions (Addendum)
Please continue the same medication. Please have the thyroid checked at Dr Raul Del office.   Please come back for a follow-up appointment in 6 months.

## 2019-02-02 NOTE — Telephone Encounter (Signed)
Once office note has been completed and closed, please route to Dr. Brigitte Pulse @ fax # [phone]

## 2019-02-02 NOTE — Telephone Encounter (Signed)
done

## 2019-02-03 NOTE — Telephone Encounter (Signed)
Chart Review Routing History Since 02/04/2018 [facility] Full Routing History)  Recipients Sent On Sent By Routed Reports   Shawnee Knapp, MD     02/03/2019  7:37 AM Aleatha Borer, LPN Office Visit on 9/77/4142 with Renato Shin, MD

## 2019-02-05 DIAGNOSIS — H52223 Regular astigmatism, bilateral: Secondary | ICD-10-CM | POA: Diagnosis not present

## 2019-02-05 DIAGNOSIS — H16223 Keratoconjunctivitis sicca, not specified as Sjogren's, bilateral: Secondary | ICD-10-CM | POA: Diagnosis not present

## 2019-02-05 DIAGNOSIS — H0100A Unspecified blepharitis right eye, upper and lower eyelids: Secondary | ICD-10-CM | POA: Diagnosis not present

## 2019-02-05 DIAGNOSIS — H524 Presbyopia: Secondary | ICD-10-CM | POA: Diagnosis not present

## 2019-02-05 DIAGNOSIS — H04123 Dry eye syndrome of bilateral lacrimal glands: Secondary | ICD-10-CM | POA: Diagnosis not present

## 2019-02-05 DIAGNOSIS — H40023 Open angle with borderline findings, high risk, bilateral: Secondary | ICD-10-CM | POA: Diagnosis not present

## 2019-02-05 LAB — HM DIABETES EYE EXAM

## 2019-02-08 DIAGNOSIS — Z7984 Long term (current) use of oral hypoglycemic drugs: Secondary | ICD-10-CM | POA: Diagnosis not present

## 2019-02-08 DIAGNOSIS — E559 Vitamin D deficiency, unspecified: Secondary | ICD-10-CM | POA: Diagnosis not present

## 2019-02-08 DIAGNOSIS — E1143 Type 2 diabetes mellitus with diabetic autonomic (poly)neuropathy: Secondary | ICD-10-CM | POA: Diagnosis not present

## 2019-02-08 DIAGNOSIS — Z79891 Long term (current) use of opiate analgesic: Secondary | ICD-10-CM | POA: Diagnosis not present

## 2019-02-08 DIAGNOSIS — G603 Idiopathic progressive neuropathy: Secondary | ICD-10-CM | POA: Diagnosis not present

## 2019-02-08 DIAGNOSIS — E672 Megavitamin-B6 syndrome: Secondary | ICD-10-CM | POA: Diagnosis not present

## 2019-02-08 DIAGNOSIS — Z791 Long term (current) use of non-steroidal anti-inflammatories (NSAID): Secondary | ICD-10-CM | POA: Diagnosis not present

## 2019-02-08 DIAGNOSIS — M810 Age-related osteoporosis without current pathological fracture: Secondary | ICD-10-CM | POA: Diagnosis not present

## 2019-02-11 ENCOUNTER — Telehealth: Payer: Self-pay | Admitting: Neurology

## 2019-02-11 NOTE — Telephone Encounter (Signed)
Suggestions?

## 2019-02-11 NOTE — Telephone Encounter (Signed)
Patient advised.

## 2019-02-11 NOTE — Telephone Encounter (Signed)
Patient is calling in about her migraines coming back -taking too much imitrex. This started about 3 weeks ago. It is every morning and her PCP Dr. Brigitte Pulse thinks she might be taking too much medication and was wanting her to have an MRI. Thanks!

## 2019-02-11 NOTE — Telephone Encounter (Signed)
She will need to make an appointment.  Virtual visit is fine.  I need to discuss with her further as it has been a while since her last visit (she missed last appointment)

## 2019-02-12 ENCOUNTER — Ambulatory Visit: Payer: Medicare Other | Attending: Internal Medicine

## 2019-02-12 DIAGNOSIS — Z20822 Contact with and (suspected) exposure to covid-19: Secondary | ICD-10-CM

## 2019-02-13 LAB — NOVEL CORONAVIRUS, NAA: SARS-CoV-2, NAA: NOT DETECTED

## 2019-03-10 DIAGNOSIS — N201 Calculus of ureter: Secondary | ICD-10-CM | POA: Diagnosis not present

## 2019-03-10 DIAGNOSIS — R350 Frequency of micturition: Secondary | ICD-10-CM | POA: Diagnosis not present

## 2019-03-10 DIAGNOSIS — N3941 Urge incontinence: Secondary | ICD-10-CM | POA: Diagnosis not present

## 2019-03-10 DIAGNOSIS — R351 Nocturia: Secondary | ICD-10-CM | POA: Diagnosis not present

## 2019-03-11 DIAGNOSIS — M5416 Radiculopathy, lumbar region: Secondary | ICD-10-CM | POA: Diagnosis not present

## 2019-03-11 DIAGNOSIS — G894 Chronic pain syndrome: Secondary | ICD-10-CM | POA: Diagnosis not present

## 2019-03-15 NOTE — Progress Notes (Signed)
NEUROLOGY FOLLOW UP OFFICE NOTE  Kayla [name] [id number]  HISTORY OF PRESENT ILLNESS: Kayla [name] is a 74 year old womanwith fibromyalgia, anxiety with history of PTSD, degenerative disc disease of cervical and lumbar spine, and history of nephrolithiasis and gastric bypass who follows up forpolyneuropathy and migraine.  UPDATE: Chronic pain: She underwent revision of her spinal cord stimulator last month. Neuropathic pain still an issue.  She would like to try increasing Lyrica back to three times daily again (didn't tolerate it in the past).  Migraine: Aimovig increased to 164m. She has had increase in headache frequency.  She has had a migraine every morning for past 6 weeks.   Current NSAIDS:flurbiprofen; Mobic Current analgesics:Excedrin Migraine,Lidoderm, voltaren 1%, Fentanyl patch Current triptans:Sumatriptan 1049mCurrent ergotamine:no Current anti-emetic:no Current muscle relaxants:Flexeril Current anti-anxiolytic:clonazepam Current sleep aide:trazodone Current Antihypertensive medications:Lasix Current Antidepressant medications:Cymbalta9066mwice daily, Lyrica 79m64mice daily(three times daily caused confusion) Current Anticonvulsant medications:No Current anti-CGRP:Aimovig 140mg37mrent Vitamins/Herbal/Supplements:None Current Antihistamines/Decongestants:no Other therapy:Physical therapy, [facility]  Caffeine:2 cups of coffee daily Alcohol:no Smoker:no Diet:poor Exercise:no Depression:no; Anxiety:yes Other pain:no Sleep hygiene:poor  HISTORY: I Neuropathy: Since July 2018, she has had increased swelling and burning numbness and tingling in the legs. She had lower extremity vascular ABIs on 08/20/16, which was negative. She has history of B12 deficiency but she takes injections and recent B12 level from 09/21/16 was over 2000. Methylmalonic acid level was 209, RPR nonreactive, homocysteine 7.8.  Labs from 07/12/16 include normal SPEP, Sed Rate 2, CRP less than 0.3, and TSH 2.430 . Vitamin D was 23 and she was advised to start supplementation. Serum glucose has been 70s up to 110. She also takes B6. She also has history of weight loss. She has fibromyalgia and was previously diagnosed with neuropathy by another neurologist. She has been on gabapentin for many years. It was briefly discontinued to see if it was contributing to the swelling. She reported no significant change, so it was restarted (she takes 300mg 57mmes daily), although she thinks it helped a little bit. NCV-EMG from 10/21/16 demonstrated chronic sensorimotor polyneuropathy of the predominantly axonal type as well as mild left median neuropathy at or distal to the wrist. Other labs include B1 146.1, B6 10.7, Sed Rate 2, HIV negative, TSH 1.780, UPEP negative. [facility]Selenium, Zinc, paraneoplastic panel and genetic testing Copper was low, so she was advised to start supplement.B6 was still elevated despite stopping supplements 3prior.  Shewaswondering if her neuropathy could be secondary to [employer]. Her husband was a [place] and reports cases of spouses exposed to [employer] their husband's semen. [place]. Her neuropathic symptoms started shortly after her return.    II Migraine: Onset: Since 1970, after her twin newborns passed away. She has lived with life-long family-related stress Location: Migraines are unilateral/parietal or bilateral retro-orbital, chronic tension type (bifrontal) Quality: Severe crushing Initial Intensity: 10/10 Aura: no Prodrome: no Associated symptoms: Photophobia, phonophobia. No nausea, vomiting or visual disturbance Initial Duration: Migraines 1 hour with sumatriptan, other headaches 15 minutes with Fioricet Initial Frequency: Daily (3 to 4  days a month are severe migraine) Triggers/exacerbating factors: Stress, Nexium Relieving factors: Clonazepam, Fioricet, sumatriptan Activity: Cannot function when severe (3-4 days per month)  Past NSAIDS: Ibuprofen, naproxen Past analgesics: Cafergot, Excedrin, Tylenol, Tramadol, Fioricet Past abortive triptans: no Past muscle relaxants:tizanidine Past anti-emetic: Zofran ODT 8mg Pa14mantihypertensive medications: Lopressor, propranolol Past antidepressant  medications: Elavil, Effexor, Zoloft Past anticonvulsant medications: Topiramate, gabapentin 330m twice daily Past vitamins/Herbal/Supplements:CoQ10 Other past therapies: yoga  Family history of headache: Father, sister, brother  MRI and MRA of brain from 07/23/11 were personally reviewed and unremarkable.  PAST MEDICAL HISTORY: Past Medical History:  Diagnosis Date  . Allergy   . Anal fissure 10/08/2016  . Anemia    anemia in the past  . Angina    takes Propanolol prn and it stops her chest  . Anxiety   . Barrett esophagus    not consistently present  . Cataract   . Chronic kidney disease    kidney stones and UTI  . Complication of anesthesia    either  BP or pulse dropped with last surgery  . DDD (degenerative disc disease)    cervical and lumbar  . [place]   . Depression    post traumatic stress  disorder  . [place]  . Diabetes mellitus    sugar goes up and down diet controlled  . Dysrhythmia    brought on by stress  . External hemorrhoids   . Fatigue   . Fibromyalgia    neuropathy knees ankles and toes, bladder  . GERD (gastroesophageal reflux disease)   . H/O hyperparathyroidism   . Headache(784.0)    migraines  . Hiatal hernia   . History of kidney stones   . Hypertension    3 small brain aneurysms  . Hypoglycemia 11/08/2016  . IBS (irritable bowel syndrome)   . Internal hemorrhoids   . Macular degeneration   . Migraines   . Mitral regurgitation  12/11/2015  . Osteoarthritis    all over  . Osteopetrosis   . Peripheral vascular disease (HCC)    superficial phlebitis in left calf  . Personal history of colonic polyps 07/22/2013   07/2013 - 3 small adenomas - repeat colonoscopy 2018  . Small intestinal bacterial overgrowth 08/05/2017   + lactulose breath test 06/2017 Xifaxan Rx    MEDICATIONS: Current Outpatient Medications on File Prior to Visit  Medication Sig Dispense Refill  . acarbose (PRECOSE) 25 MG tablet Take 1 tablet (25 mg total) by mouth 3 (three) times daily with meals. 270 tablet 3  . aspirin-acetaminophen-caffeine (EXCEDRIN MIGRAINE) 250-250-65 MG tablet Take 2 tablets by mouth 2 (two) times daily.    . Blood Glucose Monitoring Suppl (FREESTYLE FREEDOM LITE) w/Device KIT 1 each by Does not apply route 4 (four) times daily. Use to monitor glucose levels 4 times daily; E11.9 1 kit 0  . clonazePAM (KLONOPIN) 1 MG tablet Take 0.5-1 tablets (0.5-1 mg total) by mouth 2 (two) times daily as needed for anxiety. 180 tablet 0  . Continuous Blood Gluc Receiver (FREESTYLE LIBRE 14 DAY READER) DEVI 1 each by Does not apply route See admin instructions. Use with sensors to monitor glucose levels; E11.9 1 each 0  . cyclobenzaprine (FLEXERIL) 10 MG tablet Take 1 tablet (10 mg total) by mouth 3 (three) times daily as needed for muscle spasms. 90 day supply 270 tablet 3  . cycloSPORINE (RESTASIS) 0.05 % ophthalmic emulsion Place 1 drop into both eyes 2 (two) times daily. 5.5 mL 5  . diclofenac sodium (VOLTAREN) 1 % GEL Apply 2 g topically 4 (four) times daily. 700 g 3  . dicyclomine (BENTYL) 10 MG capsule Take 1-2 tab 3 times daily AC as needed for spasms and cramping. 270 capsule 1  . diltiazem 2 % GEL Apply 1 application topically 2 (two) times daily  as needed (pain from fissure). 30 g 2  . DULoxetine (CYMBALTA) 30 MG capsule Take 3 capsules (90 mg total) by mouth daily. 90 capsule 5  . Erenumab-aooe (AIMOVIG) 140 MG/ML SOAJ Inject 140 mg  into the skin every 30 (thirty) days. 90 day supply 3 pen 3  . esomeprazole (NEXIUM) 40 MG capsule Take 1 capsule (40 mg total) by mouth daily before breakfast. 90 capsule 3  . fentaNYL (DURAGESIC - DOSED MCG/HR) 50 MCG/HR Place 50 mcg onto the skin every 3 (three) days.    . flurbiprofen (ANSAID) 100 MG tablet Take 1 tablet (100 mg total) by mouth every 8 (eight) hours as needed (maximum 3 tablets in 24 hours). 24 tablet 3  . glucose blood (FREESTYLE TEST STRIPS) test strip Use to monitor glucose levels 4 times daily; E11.9 200 each 0  . HYDROcodone-acetaminophen (NORCO) 7.5-325 MG tablet Take 1 tablet every 6 (six) hours as needed by mouth for moderate pain.    . hypromellose (SYSTANE OVERNIGHT THERAPY) 0.3 % GEL ophthalmic ointment Place 1 drop into both eyes at bedtime. Reported on 02/11/2015    . Lancets (FREESTYLE) lancets Use to monitor glucose levels 4 times daily; E11.9 200 each 0  . lidocaine (LIDODERM) 5 % Place 1 patch onto the skin as needed. Remove & Discard patch within 12 hours. May dispense as 3 month supply 90 patch 3  . Lifitegrast (XIIDRA) 5 % SOLN Place 1 drop into both eyes daily.     . meloxicam (MOBIC) 15 MG tablet Take 1 tablet (15 mg total) by mouth daily. Prn pain 90 tablet 1  . metroNIDAZOLE (METROGEL) 1 % gel Apply topically daily. 45 g 0  . mirabegron ER (MYRBETRIQ) 25 MG TB24 tablet Take 1 tablet (25 mg total) by mouth daily. 90 tablet 1  . mupirocin ointment (BACTROBAN) 2 % Apply 1 application topically 3 (three) times daily. 30 g 1  . naloxegol oxalate (MOVANTIK) 25 MG TABS tablet Take 25 mg by mouth daily.    . Naloxone HCl 0.4 MG/0.4ML SOAJ Administer 0.68m [place] at first sign of opioid overdose and repeat every 2 minutes as needed for resuscitation. Call 911 immediately 5 Package 0  . NON FORMULARY [facility]  Peripheral Neuropathy Cream- Bupivacaine 1%, Doxepin 3%, Gabapentin 6%, Pentoxifylline 3%, Topiramate 1% Apply 1-2 grams to affected area 3-4 times  daily Qty. 120 gm 3 refills    . ondansetron (ZOFRAN) 8 MG tablet Take 1 tablet (8 mg total) by mouth every 8 (eight) hours as needed for nausea or vomiting. 60 tablet 0  . pregabalin (LYRICA) 50 MG capsule Take 1 capsule (50 mg total) by mouth 3 (three) times daily. 90 day supply. (Patient taking differently: Take 50 mg by mouth 2 (two) times daily. 90 day supply.) 270 capsule 3  . Probiotic Product (PROBIOTIC-10 PO) Take by mouth.     . sucralfate (CARAFATE) 1 GM/10ML suspension Take 10 mLs (1 g total) by mouth 4 (four) times daily. 420 mL 1  . SUMAtriptan (IMITREX) 100 MG tablet Take 1 tablet at earliest onset of headache, may repeat in 2 hours if headache persists or reoccurs. 30 tablet 1  . tizanidine (ZANAFLEX) 2 MG capsule TAKE 1 CAPSULE (2 MG TOTAL) BY MOUTH 3 (THREE) TIMES DAILY.    . traZODone (DESYREL) 100 MG tablet Take 0.5-1 tablets (50-100 mg total) by mouth at bedtime as needed for sleep. And 1/2 -1 if wakes at 4am. 90 tablet 1  . valACYclovir (VALTREX) 1000  MG tablet Take 1 tablet (1,000 mg total) by mouth 3 (three) times daily. 21 tablet 0  . Vitamin D, Ergocalciferol, (DRISDOL) 1.25 MG (50000 UT) CAPS capsule Take 1 capsule (50,000 Units total) by mouth every 7 (seven) days. 12 capsule 3   No current facility-administered medications on file prior to visit.    ALLERGIES: Allergies  Allergen Reactions  . Ambien [Zolpidem Tartrate]     Hallucinations  . Codeine Anaphylaxis  . Oxycodone-Acetaminophen Shortness Of Breath  . Tylox [Oxycodone-Acetaminophen] Anaphylaxis    Chest pain  . Darvocet [Propoxyphene N-Acetaminophen]     Chest pain  . Darvon [Propoxyphene] Itching  . Lorcet [Hydrocodone-Acetaminophen]     Chest pain  . Opium     hallucinations  . Vicodin [Hydrocodone-Acetaminophen] Other (See Comments)    Chest Pain   . Wheat Bran   . Amoxicillin Nausea And Vomiting    Reaction:  Migraine headache  . Penicillins Nausea And Vomiting    Migraine Headaches     FAMILY HISTORY: Family History  Problem Relation Age of Onset  . Stroke Mother   . Lung cancer Mother   . Heart disease Mother   . Diabetes Mother   . Hypertension Father   . Stroke Father   . Lung cancer Father   . Heart disease Father   . Kidney disease Father   . Seizures Father        epilepsy, and sisters x 2  . Pancreatic cancer Sister   . Lung cancer Sister   . Colon cancer Maternal Aunt        12 relatives  . Uterine cancer Other        aunt  . Heart disease Other        grandmother  . Clotting disorder Sister   . Heart disease Sister        x 2  . Kidney disease Sister        x 2  . Breast cancer Sister   . Colon cancer Paternal Aunt   . Colon cancer Paternal Aunt   . Esophageal cancer Neg Hx   . Rectal cancer Neg Hx   . Stomach cancer Neg Hx      SOCIAL HISTORY: Social History   Socioeconomic History  . Marital status: Married    Spouse name: Rushie Chestnut.  . Number of children: 2  . Years of education: Not on file  . Highest education level: Not on file  Occupational History  . Occupation: Disability    Employer: RETIRED  Tobacco Use  . Smoking status: Never Smoker  . Smokeless tobacco: Never Used  Substance and Sexual Activity  . Alcohol use: No  . Drug use: No  . Sexual activity: Not Currently  Other Topics Concern  . Not on file  Social History Narrative   Right handed   Two story home   She retired on disability   Married   2 Children   Social Determinants of Health   Financial Resource Strain:   . Difficulty of Paying Living Expenses: Not on file  Food Insecurity:   . Worried About Charity fundraiser in the Last Year: Not on file  . Ran Out of Food in the Last Year: Not on file  Transportation Needs:   . Lack of Transportation (Medical): Not on file  . Lack of Transportation (Non-Medical): Not on file  Physical Activity:   . Days of Exercise per Week: Not on file  . Minutes of  Exercise per Session: Not on file  Stress:    . Feeling of Stress : Not on file  Social Connections:   . Frequency of Communication with Friends and Family: Not on file  . Frequency of Social Gatherings with Friends and Family: Not on file  . Attends Religious Services: Not on file  . Active Member of Clubs or Organizations: Not on file  . Attends Archivist Meetings: Not on file  . Marital Status: Not on file  Intimate Partner Violence:   . Fear of Current or Ex-Partner: Not on file  . Emotionally Abused: Not on file  . Physically Abused: Not on file  . Sexually Abused: Not on file    PHYSICAL EXAM: Blood pressure 110/60, resp. rate 18, height _0  (1.651 m), weight 108 lb (49 kg). General: No acute distress.  Patient appears well-groomed.   Head:  Normocephalic/atraumatic Eyes:  Fundi examined but not visualized Neck: supple, no paraspinal tenderness, full range of motion Heart:  Regular rate and rhythm Lungs:  Clear to auscultation bilaterally Back: No paraspinal tenderness Neurological Exam: alert and oriented to person, place, and time. Attention span and concentration intact, recent and remote memory intact, fund of knowledge intact.  Speech fluent and not dysarthric, language intact.  CN II-XII intact. Bulk and tone normal, muscle strength 5/5 throughout.  Sensation to pinprick sensation reduced to ankles bilaterally, vibratory sensation reduced to below knees.  Deep tendon reflexes absnet throughout, toes downgoing.  Finger to nose testing intact.  Cautious wide-based gait.  Romberg positive.  IMPRESSION: 1.  Migraine without aura, without status migrainosus, not intractable 2.  Idiopathic peripheral neuropathy  PLAN: 1.  For migraine preventative management, switch from Aimovig to [facility] 2.  For headache abortive therapy, sumatriptan 3. For neuropathic pain, Lyrica (will try increasing to three times daily again) and Cymbalta 4.  Limit use of pain relievers to no more than 2 days out of week to prevent  risk of rebound or medication-overuse headache. 5.  Keep headache diary 6.  Exercise, hydration, caffeine cessation, sleep hygiene, monitor for and avoid triggers 7. Follow up 6 months.   Metta Clines, DO  CC:  [name], MD  Delia Chimes, MD

## 2019-03-16 ENCOUNTER — Encounter: Payer: Self-pay | Admitting: Neurology

## 2019-03-16 ENCOUNTER — Other Ambulatory Visit: Payer: Self-pay

## 2019-03-16 ENCOUNTER — Ambulatory Visit (INDEPENDENT_AMBULATORY_CARE_PROVIDER_SITE_OTHER): Payer: Medicare Other | Admitting: Neurology

## 2019-03-16 VITALS — BP 110/60 | Resp 18 | Ht 65.0 in | Wt 108.0 lb

## 2019-03-16 DIAGNOSIS — G609 Hereditary and idiopathic neuropathy, unspecified: Secondary | ICD-10-CM

## 2019-03-16 DIAGNOSIS — G43009 Migraine without aura, not intractable, without status migrainosus: Secondary | ICD-10-CM

## 2019-03-16 MED ORDER — EMGALITY 120 MG/ML ~~LOC~~ SOAJ: 120.0000 mg | SUBCUTANEOUS | 3 refills | Status: DC

## 2019-03-16 NOTE — Patient Instructions (Signed)
1.  Stop Aimovig.  Start [employer].  First dose is 2 injections but then onely 1 injection every 28 days. 2.  Increase Lyrica to three times daily 3.  Follow up in 6 months.

## 2019-03-25 DIAGNOSIS — N200 Calculus of kidney: Secondary | ICD-10-CM | POA: Diagnosis not present

## 2019-03-30 ENCOUNTER — Telehealth: Payer: Self-pay | Admitting: Endocrinology

## 2019-03-30 MED ORDER — FREESTYLE LIBRE 14 DAY SENSOR MISC: 1.0000 | 3 refills | Status: DC

## 2019-03-30 NOTE — Telephone Encounter (Signed)
Outpatient Medication Detail   Disp Refills Start End   Continuous Blood Gluc Sensor (FREESTYLE LIBRE 14 DAY SENSOR) MISC 6 each 3 03/30/2019    Sig - Route: 1 Device by Does not apply route every 14 (fourteen) days. - Does not apply   Sent to pharmacy as: Continuous Blood Gluc Sensor (FREESTYLE LIBRE [place]) Misc   E-Prescribing Status: Receipt confirmed by pharmacy (03/30/2019  3:48 PM EDT)

## 2019-03-30 NOTE — Telephone Encounter (Signed)
Please review request. Please advise about the following:  Not seeing that she is on a Freestyle sensor but rather freestyle strips. Refill for 1 year? Generally you refill up to 90 days at a time to ensure compliance with follow up

## 2019-03-30 NOTE — Telephone Encounter (Signed)
MEDICATION: Freestyle Libre 14 Day Sensors with refills to last 1 year  PHARMACY:   [id number] [place], [place] - [address] Phone:  [phone]  Fax:  [phone]      IS THIS A 90 DAY SUPPLY : Yes  IS PATIENT OUT OF MEDICATION: Yes  IF NOT; HOW MUCH IS LEFT: 0  LAST APPOINTMENT DATE: @1 /25/2021  NEXT APPOINTMENT DATE:@7 /20/2021  DO WE HAVE YOUR PERMISSION TO LEAVE A DETAILED MESSAGE: yes  OTHER COMMENTS:    **Let patient know to contact pharmacy at the end of the day to make sure medication is ready. **  ** Please notify patient to allow 48-72 hours to process**  **Encourage patient to contact the pharmacy for refills or they can request refills through [facility]**

## 2019-03-30 NOTE — Telephone Encounter (Signed)
Ok, I have sent a prescription to your pharmacy 

## 2019-03-31 DIAGNOSIS — Z23 Encounter for immunization: Secondary | ICD-10-CM | POA: Diagnosis not present

## 2019-04-20 DIAGNOSIS — Z23 Encounter for immunization: Secondary | ICD-10-CM | POA: Diagnosis not present

## 2019-04-21 DIAGNOSIS — N3010 Interstitial cystitis (chronic) without hematuria: Secondary | ICD-10-CM | POA: Diagnosis not present

## 2019-04-21 DIAGNOSIS — N3946 Mixed incontinence: Secondary | ICD-10-CM | POA: Diagnosis not present

## 2019-04-21 DIAGNOSIS — R350 Frequency of micturition: Secondary | ICD-10-CM | POA: Diagnosis not present

## 2019-04-29 ENCOUNTER — Ambulatory Visit: Payer: Medicare Other | Admitting: Cardiovascular Disease

## 2019-05-03 DIAGNOSIS — R3981 Functional urinary incontinence: Secondary | ICD-10-CM | POA: Diagnosis not present

## 2019-05-03 DIAGNOSIS — M130 Polyarthritis, unspecified: Secondary | ICD-10-CM | POA: Diagnosis not present

## 2019-05-05 ENCOUNTER — Other Ambulatory Visit: Payer: Self-pay

## 2019-05-05 ENCOUNTER — Encounter: Payer: Self-pay | Admitting: Cardiovascular Disease

## 2019-05-05 ENCOUNTER — Ambulatory Visit (INDEPENDENT_AMBULATORY_CARE_PROVIDER_SITE_OTHER): Payer: Medicare Other | Admitting: Cardiovascular Disease

## 2019-05-05 VITALS — BP 104/56 | HR 100 | Ht 65.0 in | Wt 108.0 lb

## 2019-05-05 DIAGNOSIS — I951 Orthostatic hypotension: Secondary | ICD-10-CM

## 2019-05-05 NOTE — Patient Instructions (Addendum)
Medication Instructions:  Your physician recommends that you continue on your current medications as directed. Please refer to the Current Medication list given to you today.  *If you need a refill on your cardiac medications before your next appointment, please call your pharmacy*   Lab Work: None If you have labs (blood work) drawn today and your tests are completely normal, you will receive your results only by: Marland Kitchen MyChart Message (if you have MyChart) OR . A paper copy in the mail If you have any lab test that is abnormal or we need to change your treatment, we will call you to review the results.   Testing/Procedures: None   Follow-Up: At [facility], you and your health needs are our priority.  As part of our continuing mission to provide you with exceptional heart care, we have created designated Provider Care Teams.  These Care Teams include your primary Cardiologist (physician) and Advanced Practice Providers (APPs -  Physician Assistants and Nurse Practitioners) who all work together to provide you with the care you need, when you need it.  We recommend signing up for the patient portal called "MyChart".  Sign up information is provided on this After Visit Summary.  MyChart is used to connect with patients for Virtual Visits (Telemedicine).  Patients are able to view lab/test results, encounter notes, upcoming appointments, etc.  Non-urgent messages can be sent to your provider as well.   To learn more about what you can do with MyChart, go to [email or web address].    Your next appointment:   12 month(s)  The format for your next appointment:   In Person  Provider:   You may see Mertie Moores, MD or one of the following Advanced Practice Providers on your designated Care Team:    Richardson Dopp, PA-C  [place], [place]  Daune Perch, NP    Other Instructions    For your  leg edema you  should do  the following 1. Leg elevation - I recommend the Lounge Dr.  Leg rest.  See below for details  2. Salt restriction  -  Use potassium chloride instead of regular salt as a salt substitute. 3. Walk regularly 4. Compression hose - [facility] 5. Weight loss    Available on [email or web address] Or  Go to [email or web address]

## 2019-05-05 NOTE — Progress Notes (Signed)
Kayla [name] Date of Birth  05/27/1945       [facility] Office [address], [address], [place], [place]  [address]   [place], [place]  [address] [phone]     [phone]   Fax  [phone]    Fax [phone]  Problem List: 1. Mitral valve prolapse-mitral regurgitation 2. Hypertension 3. Barrett's esophagus 4. Fibromyalgia 5. History chest pain-she had a normal heart catheterization in 1993. Stress Myoview study in 2008 was normal. 6. Brain aneurism -  7. S/p Roux-en-Y surgery  for Barretts esophagus   Kayla [name] continues to have chest pain.  She takes PRN propranolol which seems to help.  It typically occurs in the afternoon.  Almost always with rest.  She does not get any regular exercise.  She has had lots of bladder infections due to kidney stones recently that have not permitted her to exercise.  She's had lots of problems with anxiety.  July 15, 2014:  Kayla [name] presents for evaluation Her BP has been very low - possibly due to the various medications .  She has had left sided chest pain for years.  Has had a normal cath in the remote past and had a normal myoview  Several years ago .   She was recently seen by Dr. Newman Pies for some palpitations The palpitations would come and go .  Off and on for hours.  Was started on atenolol which has helped the palpitations. She had taken atenolol for years ( many years ago) but was off atenolol for several years.  Her palpitations have resolved after starting the atenolol .    These episodes of tachycardia are at times associated with episodes of CP . Has lost a lot of weight over the past several years. She had a Roux -in - Y gastric bypass ( supposedly to prevent esophageal cancer from Barretts esophagutis. Is not able to eat much.  Can eat 1/4 cup of food at a time .    Sept. 19, 2016:  Continues to have palpitations  - improve with propranolol Continues to have poor  appetite.  Is generally very weak Continues to see her primary medical doctor and her pain management doctor .   Nov. 27,2017:  Seems to be doing well from a cardiac standpoint. Having urology issues.  May need a bladder tack. Still has   Nov. 7, 2018 Has been diagnosed with DM and has diabetic neuropathy.   Lots of pain in her feet.   Still has some CP Still has some palpittations  .   Has lost 40 lbs over the past 10 years.  Admitted that she is not eating well.   July 02, 2017: Follow up for MVP and noncardiac CP  Hx of HTN but has been low for the past several years.   Doing well. Now has a Freestyle Libra glucose monitor  Doing well with that  Continues to lose weight Wt is 110 lbs. Down 6 lbs from last Nov.  Not eating well.   Needs to eat more protein   December 17, 2017: Kayla [name] is seen today for follow-up visit.  She is had problems with orthostatic hypotension.  We felt that she may have some degree of dysautonomia.  It is clear that her diet is not very good. Her weight today is 101 pounds.  This is down 9 pounds from her previous visit.  Her symptoms of orthostasis have improved  Develops palpitations after eating   May 05, 2019:  Kayla [name] is seen today for follow up of her MVP and orthostatic hypotension .  Wt is 108 lbs today ( up 7 lbs from last visit ) , has some leg swelling  She took lasix yesterday  Advised her to elevated her legs and consider compression hose.  She saw her primary care yesterday  Had labs drawn yesterday  Has some mild chest pain on occasion.    Current Outpatient Medications on File Prior to Visit  Medication Sig Dispense Refill  . acarbose (PRECOSE) 25 MG tablet Take 1 tablet (25 mg total) by mouth 3 (three) times daily with meals. 270 tablet 3  . aspirin-acetaminophen-caffeine (EXCEDRIN MIGRAINE) 250-250-65 MG tablet Take 2 tablets by mouth 2 (two) times daily.    . Blood Glucose Monitoring Suppl (FREESTYLE FREEDOM LITE)  w/Device KIT 1 each by Does not apply route 4 (four) times daily. Use to monitor glucose levels 4 times daily; E11.9 1 kit 0  . clonazePAM (KLONOPIN) 1 MG tablet Take 0.5-1 tablets (0.5-1 mg total) by mouth 2 (two) times daily as needed for anxiety. 180 tablet 0  . Continuous Blood Gluc Receiver (FREESTYLE LIBRE 14 DAY READER) DEVI 1 each by Does not apply route See admin instructions. Use with sensors to monitor glucose levels; E11.9 1 each 0  . Continuous Blood Gluc Sensor (FREESTYLE LIBRE 14 DAY SENSOR) MISC 1 Device by Does not apply route every 14 (fourteen) days. 6 each 3  . cyclobenzaprine (FLEXERIL) 10 MG tablet Take 1 tablet (10 mg total) by mouth 3 (three) times daily as needed for muscle spasms. 90 day supply 270 tablet 3  . cycloSPORINE (RESTASIS) 0.05 % ophthalmic emulsion Place 1 drop into both eyes 2 (two) times daily. 5.5 mL 5  . diclofenac sodium (VOLTAREN) 1 % GEL Apply 2 g topically 4 (four) times daily. 700 g 3  . dicyclomine (BENTYL) 10 MG capsule Take 1-2 tab 3 times daily AC as needed for spasms and cramping. 270 capsule 1  . diltiazem 2 % GEL Apply 1 application topically 2 (two) times daily as needed (pain from fissure). 30 g 2  . DULoxetine (CYMBALTA) 30 MG capsule Take 3 capsules (90 mg total) by mouth daily. 90 capsule 5  . Erenumab-aooe (AIMOVIG) 140 MG/ML SOAJ Inject 140 mg into the skin every 30 (thirty) days. 90 day supply 3 pen 3  . esomeprazole (NEXIUM) 40 MG capsule Take 1 capsule (40 mg total) by mouth daily before breakfast. 90 capsule 3  . fentaNYL (DURAGESIC - DOSED MCG/HR) 50 MCG/HR Place 50 mcg onto the skin every 3 (three) days.    . flurbiprofen (ANSAID) 100 MG tablet Take 1 tablet (100 mg total) by mouth every 8 (eight) hours as needed (maximum 3 tablets in 24 hours). 24 tablet 3  . Galcanezumab-gnlm (EMGALITY) 120 MG/ML SOAJ Inject 120 mg into the skin every 28 (twenty-eight) days. 3 pen 3  . glucose blood (FREESTYLE TEST STRIPS) test strip Use to monitor  glucose levels 4 times daily; E11.9 200 each 0  . HYDROcodone-acetaminophen (NORCO) 7.5-325 MG tablet Take 1 tablet every 6 (six) hours as needed by mouth for moderate pain.    . hypromellose (SYSTANE OVERNIGHT THERAPY) 0.3 % GEL ophthalmic ointment Place 1 drop into both eyes at bedtime. Reported on 02/11/2015    . Lancets (FREESTYLE) lancets Use to monitor glucose levels 4 times daily; E11.9 200 each 0  . lidocaine (LIDODERM) 5 % Place  1 patch onto the skin as needed. Remove & Discard patch within 12 hours. May dispense as 3 month supply 90 patch 3  . Lifitegrast (XIIDRA) 5 % SOLN Place 1 drop into both eyes daily.     . meloxicam (MOBIC) 15 MG tablet Take 1 tablet (15 mg total) by mouth daily. Prn pain 90 tablet 1  . metroNIDAZOLE (METROGEL) 1 % gel Apply topically daily. 45 g 0  . mirabegron ER (MYRBETRIQ) 25 MG TB24 tablet Take 1 tablet (25 mg total) by mouth daily. 90 tablet 1  . naloxegol oxalate (MOVANTIK) 25 MG TABS tablet Take 25 mg by mouth daily.    . Naloxone HCl 0.4 MG/0.4ML SOAJ Administer 0.'4mg'$  [place] at first sign of opioid overdose and repeat every 2 minutes as needed for resuscitation. Call 911 immediately 5 Package 0  . NON FORMULARY [facility]  Peripheral Neuropathy Cream- Bupivacaine 1%, Doxepin 3%, Gabapentin 6%, Pentoxifylline 3%, Topiramate 1% Apply 1-2 grams to affected area 3-4 times daily Qty. 120 gm 3 refills    . ondansetron (ZOFRAN) 8 MG tablet Take 1 tablet (8 mg total) by mouth every 8 (eight) hours as needed for nausea or vomiting. 60 tablet 0  . pregabalin (LYRICA) 50 MG capsule Take 1 capsule (50 mg total) by mouth 3 (three) times daily. 90 day supply. (Patient taking differently: Take 50 mg by mouth 2 (two) times daily. 90 day supply.) 270 capsule 3  . Probiotic Product (PROBIOTIC-10 PO) Take by mouth.     . sucralfate (CARAFATE) 1 GM/10ML suspension Take 10 mLs (1 g total) by mouth 4 (four) times daily. 420 mL 1  . SUMAtriptan (IMITREX) 100 MG tablet Take  1 tablet at earliest onset of headache, may repeat in 2 hours if headache persists or reoccurs. 30 tablet 1  . tizanidine (ZANAFLEX) 2 MG capsule TAKE 1 CAPSULE (2 MG TOTAL) BY MOUTH 3 (THREE) TIMES DAILY.    . traZODone (DESYREL) 100 MG tablet Take 0.5-1 tablets (50-100 mg total) by mouth at bedtime as needed for sleep. And 1/2 -1 if wakes at 4am. 90 tablet 1  . valACYclovir (VALTREX) 1000 MG tablet Take 1 tablet (1,000 mg total) by mouth 3 (three) times daily. 21 tablet 0  . Vitamin D, Ergocalciferol, (DRISDOL) 1.25 MG (50000 UT) CAPS capsule Take 1 capsule (50,000 Units total) by mouth every 7 (seven) days. 12 capsule 3   No current facility-administered medications on file prior to visit.    Allergies  Allergen Reactions  . Ambien [Zolpidem Tartrate]     Hallucinations  . Codeine Anaphylaxis  . Oxycodone-Acetaminophen Shortness Of Breath  . Tylox [Oxycodone-Acetaminophen] Anaphylaxis    Chest pain  . Darvocet [Propoxyphene N-Acetaminophen]     Chest pain  . Darvon [Propoxyphene] Itching  . Lorcet [Hydrocodone-Acetaminophen]     Chest pain  . Opium     hallucinations  . Vicodin [Hydrocodone-Acetaminophen] Other (See Comments)    Chest Pain   . Wheat Bran   . Amoxicillin Nausea And Vomiting    Reaction:  Migraine headache  . Penicillins Nausea And Vomiting    Migraine Headaches    Past Medical History:  Diagnosis Date  . Allergy   . Anal fissure 10/08/2016  . Anemia    anemia in the past  . Angina    takes Propanolol prn and it stops her chest  . Anxiety   . Barrett esophagus    not consistently present  . Cataract   . Chronic kidney disease  kidney stones and UTI  . Complication of anesthesia    either  BP or pulse dropped with last surgery  . DDD (degenerative disc disease)    cervical and lumbar  . Dementia ([place])   . Depression    post traumatic stress  disorder  . Diabetes ([place]) 11/18/2016  . Diabetes mellitus    sugar goes up and down diet controlled  .  Dysrhythmia    brought on by stress  . External hemorrhoids   . Fatigue   . Fibromyalgia    neuropathy knees ankles and toes, bladder  . GERD (gastroesophageal reflux disease)   . H/O hyperparathyroidism   . Headache(784.0)    migraines  . Hiatal hernia   . History of kidney stones   . Hypertension    3 small brain aneurysms  . Hypoglycemia 11/08/2016  . IBS (irritable bowel syndrome)   . Internal hemorrhoids   . Macular degeneration   . Migraines   . Mitral regurgitation 12/11/2015  . Osteoarthritis    all over  . Osteopetrosis   . Peripheral vascular disease ([facility])    superficial phlebitis in left calf  . Personal history of colonic polyps 07/22/2013   07/2013 - 3 small adenomas - repeat colonoscopy 2018  . Small intestinal bacterial overgrowth 08/05/2017   + lactulose breath test 06/2017 Xifaxan Rx    Past Surgical History:  Procedure Laterality Date  . ABDOMINAL HYSTERECTOMY    . APPENDECTOMY    . CARDIAC CATHETERIZATION  03/22/1991   EF 61%  . CARDIOVASCULAR STRESS TEST  10/03/2006  . CHOLECYSTECTOMY    . COLONOSCOPY  06/23/2008   normal  . DILATION AND CURETTAGE OF UTERUS     x2  . ESOPHAGUS SURGERY    . EXPLORATORY LAPAROTOMY  1993  . EYE SURGERY    . GASTRIC BYPASS  1999  . GASTRIC RESTRICTION SURGERY  1991  . LASIK Bilateral   . Right Arm Surgery    . Right Knee Arthroscopy    . Rt wrist fx  2009  . spinal surgery battery implant    . stomach stappeling  1991  . STONE EXTRACTION WITH BASKET  2012  . TONSILLECTOMY    . TUBAL LIGATION    . UPPER GASTROINTESTINAL ENDOSCOPY  06/23/2008   w/Dil, Barrett's esophagus  . US ECHOCARDIOGRAPHY  01/21/2007   EF 55-60%  . US ECHOCARDIOGRAPHY  08/30/2003   EF 55-60%    Social History   Tobacco Use  Smoking Status Never Smoker  Smokeless Tobacco Never Used    Social History   Substance and Sexual Activity  Alcohol Use No    Family History  Problem Relation Age of Onset  . Stroke Mother   . Lung cancer  Mother   . Heart disease Mother   . Diabetes Mother   . Hypertension Father   . Stroke Father   . Lung cancer Father   . Heart disease Father   . Kidney disease Father   . Seizures Father        epilepsy, and sisters x 2  . Pancreatic cancer Sister   . Lung cancer Sister   . Colon cancer Maternal Aunt        12 relatives  . Uterine cancer Other        aunt  . Heart disease Other        grandmother  . Clotting disorder Sister   . Heart disease Sister  x 2  . Kidney disease Sister        x 2  . Breast cancer Sister   . Colon cancer Paternal Aunt   . Colon cancer Paternal Aunt   . Esophageal cancer Neg Hx   . Rectal cancer Neg Hx   . Stomach cancer Neg Hx     Reviw of Systems:  Reviewed in the HPI.  All other systems are negative.  Physical Exam: There were no vitals taken for this visit.  GEN:  Well nourished, well developed in no acute distress HEENT: Normal NECK: No JVD; No carotid bruits LYMPHATICS: No lymphadenopathy CARDIAC: RRR, no significant murmur  RESPIRATORY:  Clear to auscultation without rales, wheezing or rhonchi  ABDOMEN: Soft, non-tender, non-distended MUSCULOSKELETAL:  No edema; No deformity  SKIN: Warm and dry NEUROLOGIC:  Alert and oriented x 3   ECG:    Assessment / Plan:   1.  Orthostatic hypotension:      2.  Chest pain:     Has atypical cp.  No real angina  2.  Leg swelling :   Complains of leg swelling .  Took a lasix this past week.  Ive recommended that she use the lounge doctor leg rest and also try compression hose.  She has normal left ventricular systolic function.  I do not think that this is cardiac related.  She admits that she is very inactive now because of her peripheral neuropathy and I suspect she sitting around for much of the day.  The swelling typically is better in the morning and worsens throughout the day.     Mertie Moores, MD  05/05/2019 8:09 AM    [address],   [place], [place]  [address] Pager [phone] Phone: [phone]; Fax: [phone]

## 2019-05-12 ENCOUNTER — Telehealth: Payer: Self-pay | Admitting: Endocrinology

## 2019-05-12 ENCOUNTER — Other Ambulatory Visit: Payer: Self-pay | Admitting: Family Medicine

## 2019-05-12 DIAGNOSIS — Z1231 Encounter for screening mammogram for malignant neoplasm of breast: Secondary | ICD-10-CM

## 2019-05-12 DIAGNOSIS — E2839 Other primary ovarian failure: Secondary | ICD-10-CM

## 2019-05-12 DIAGNOSIS — R5381 Other malaise: Secondary | ICD-10-CM

## 2019-05-12 MED ORDER — GLUCAGON (RDNA) 1 MG IJ KIT: 1.0000 mg | PACK | Freq: Once | INTRAMUSCULAR | 12 refills | Status: AC | PRN

## 2019-05-12 NOTE — Telephone Encounter (Signed)
Please advise 

## 2019-05-12 NOTE — Telephone Encounter (Signed)
Ok, I have sent a prescription to your pharmacy 

## 2019-05-12 NOTE — Addendum Note (Signed)
Addended by: Renato Shin on: 05/12/2019 04:30 PM   Modules accepted: Orders

## 2019-05-12 NOTE — Telephone Encounter (Signed)
Name of medication? glucagon  Is this a 90 day supply? Patient unsure  Name and location of pharmacy?  [id number] [place], [place] - [address] AT [place] Phone:  [phone]  Fax:  [phone]      . Patient requesting a new RX for glucagon - she's been watching videos and feels this might help when her glucose goes down to the 40's and she is incoherent and goes to sleep and has a very hard time waking up.

## 2019-05-13 NOTE — Telephone Encounter (Signed)
Outpatient Medication Detail   Disp Refills Start End   glucagon 1 MG injection 1 each 12 05/12/2019    Sig - Route: Inject 1 mg into the muscle once as needed for up to 1 dose. - Intramuscular   Sent to pharmacy as: glucagon 1 MG injection   E-Prescribing Status: Receipt confirmed by pharmacy (05/12/2019  4:31 PM EDT)

## 2019-05-18 DIAGNOSIS — H16223 Keratoconjunctivitis sicca, not specified as Sjogren's, bilateral: Secondary | ICD-10-CM | POA: Diagnosis not present

## 2019-05-18 DIAGNOSIS — H04123 Dry eye syndrome of bilateral lacrimal glands: Secondary | ICD-10-CM | POA: Diagnosis not present

## 2019-05-18 DIAGNOSIS — H40023 Open angle with borderline findings, high risk, bilateral: Secondary | ICD-10-CM | POA: Diagnosis not present

## 2019-05-26 ENCOUNTER — Other Ambulatory Visit: Payer: Self-pay

## 2019-05-26 ENCOUNTER — Encounter: Payer: Self-pay | Admitting: Endocrinology

## 2019-05-26 ENCOUNTER — Ambulatory Visit (INDEPENDENT_AMBULATORY_CARE_PROVIDER_SITE_OTHER): Payer: Medicare Other | Admitting: Endocrinology

## 2019-05-26 VITALS — BP 120/80 | HR 95 | Ht 65.0 in | Wt 110.0 lb

## 2019-05-26 DIAGNOSIS — E11649 Type 2 diabetes mellitus with hypoglycemia without coma: Secondary | ICD-10-CM

## 2019-05-26 LAB — GLUCOSE, POCT (MANUAL RESULT ENTRY): POC Glucose: 103 mg/dl — AB (ref 70–99)

## 2019-05-26 LAB — POCT GLYCOSYLATED HEMOGLOBIN (HGB A1C): Hemoglobin A1C: 5.3 % (ref 4.0–5.6)

## 2019-05-26 MED ORDER — ACARBOSE 50 MG PO TABS: 50.0000 mg | ORAL_TABLET | Freq: Three times a day (TID) | ORAL | 3 refills | Status: DC

## 2019-05-26 NOTE — Progress Notes (Signed)
Subjective:    Patient ID: Kayla [name], female    DOB: 08/22/1945, 74 y.o.   MRN: [id number]  HPI Pt returns for f/u of reactive hypoglycemia (she had gastric bypass surgery in 1999, but she reports intermittent hypoglycemia since 1979; no other cause was found; she takes acarbose).  She also reports ongoing pain at both feet.  I reviewed continuous glucose monitor data today.  Glucose varies from 65-170.  However, pt says cbg has been as low as 45.  she reports drowsiness, headache, sweating, and tremor.  She can get these sxs no matter what her glucose.  She feels like acarbose no longer works.  Past Medical History:  Diagnosis Date  . Allergy   . Anal fissure 10/08/2016  . Anemia    anemia in the past  . Angina    takes Propanolol prn and it stops her chest  . Anxiety   . Barrett esophagus    not consistently present  . Cataract   . Chronic kidney disease    kidney stones and UTI  . Complication of anesthesia    either  BP or pulse dropped with last surgery  . DDD (degenerative disc disease)    cervical and lumbar  . Dementia ([place])   . Depression    post traumatic stress  disorder  . Diabetes ([place]) 11/18/2016  . Diabetes mellitus    sugar goes up and down diet controlled  . Dysrhythmia    brought on by stress  . External hemorrhoids   . Fatigue   . Fibromyalgia    neuropathy knees ankles and toes, bladder  . GERD (gastroesophageal reflux disease)   . H/O hyperparathyroidism   . Headache(784.0)    migraines  . Hiatal hernia   . History of kidney stones   . Hypertension    3 small brain aneurysms  . Hypoglycemia 11/08/2016  . IBS (irritable bowel syndrome)   . Internal hemorrhoids   . Macular degeneration   . Migraines   . Mitral regurgitation 12/11/2015  . Osteoarthritis    all over  . Osteopetrosis   . Peripheral vascular disease ([facility])    superficial phlebitis in left calf  . Personal history of colonic polyps 07/22/2013   07/2013 - 3 small adenomas -  repeat colonoscopy 2018  . Small intestinal bacterial overgrowth 08/05/2017   + lactulose breath test 06/2017 Xifaxan Rx    Past Surgical History:  Procedure Laterality Date  . ABDOMINAL HYSTERECTOMY    . APPENDECTOMY    . CARDIAC CATHETERIZATION  03/22/1991   EF 61%  . CARDIOVASCULAR STRESS TEST  10/03/2006  . CHOLECYSTECTOMY    . COLONOSCOPY  06/23/2008   normal  . DILATION AND CURETTAGE OF UTERUS     x2  . ESOPHAGUS SURGERY    . EXPLORATORY LAPAROTOMY  1993  . EYE SURGERY    . GASTRIC BYPASS  1999  . GASTRIC RESTRICTION SURGERY  1991  . LASIK Bilateral   . Right Arm Surgery    . Right Knee Arthroscopy    . Rt wrist fx  2009  . spinal surgery battery implant    . stomach stappeling  1991  . STONE EXTRACTION WITH BASKET  2012  . TONSILLECTOMY    . TUBAL LIGATION    . UPPER GASTROINTESTINAL ENDOSCOPY  06/23/2008   w/Dil, Barrett's esophagus  . US ECHOCARDIOGRAPHY  01/21/2007   EF 55-60%  . US ECHOCARDIOGRAPHY  08/30/2003   EF 55-60%    Social  History   Socioeconomic History  . Marital status: Married    Spouse name: Rushie Chestnut.  . Number of children: 2  . Years of education: Not on file  . Highest education level: Not on file  Occupational History  . Occupation: Disability    Employer: RETIRED  Tobacco Use  . Smoking status: Never Smoker  . Smokeless tobacco: Never Used  Substance and Sexual Activity  . Alcohol use: No  . Drug use: No  . Sexual activity: Not Currently  Other Topics Concern  . Not on file  Social History Narrative   Right handed   Two story home   She retired on disability   Married   2 Children   Social Determinants of Health   Financial Resource Strain:   . Difficulty of Paying Living Expenses:   Food Insecurity:   . Worried About Charity fundraiser in the Last Year:   . Arboriculturist in the Last Year:   Transportation Needs:   . Film/video editor (Medical):   Marland Kitchen Lack of Transportation (Non-Medical):   Physical Activity:   .  Days of Exercise per Week:   . Minutes of Exercise per Session:   Stress:   . Feeling of Stress :   Social Connections:   . Frequency of Communication with Friends and Family:   . Frequency of Social Gatherings with Friends and Family:   . Attends Religious Services:   . Active Member of Clubs or Organizations:   . Attends Archivist Meetings:   Marland Kitchen Marital Status:   Intimate Partner Violence:   . Fear of Current or Ex-Partner:   . Emotionally Abused:   Marland Kitchen Physically Abused:   . Sexually Abused:     Current Outpatient Medications on File Prior to Visit  Medication Sig Dispense Refill  . aspirin-acetaminophen-caffeine (EXCEDRIN MIGRAINE) 250-250-65 MG tablet Take 2 tablets by mouth 2 (two) times daily.    . Blood Glucose Monitoring Suppl (FREESTYLE FREEDOM LITE) w/Device KIT 1 each by Does not apply route 4 (four) times daily. Use to monitor glucose levels 4 times daily; E11.9 1 kit 0  . clonazePAM (KLONOPIN) 1 MG tablet Take 0.5-1 tablets (0.5-1 mg total) by mouth 2 (two) times daily as needed for anxiety. 180 tablet 0  . Continuous Blood Gluc Receiver (FREESTYLE LIBRE 14 DAY READER) DEVI 1 each by Does not apply route See admin instructions. Use with sensors to monitor glucose levels; E11.9 1 each 0  . Continuous Blood Gluc Sensor (FREESTYLE LIBRE 14 DAY SENSOR) MISC 1 Device by Does not apply route every 14 (fourteen) days. 6 each 3  . cyclobenzaprine (FLEXERIL) 10 MG tablet Take 1 tablet (10 mg total) by mouth 3 (three) times daily as needed for muscle spasms. 90 day supply 270 tablet 3  . cycloSPORINE (RESTASIS) 0.05 % ophthalmic emulsion Place 1 drop into both eyes 2 (two) times daily. 5.5 mL 5  . diclofenac sodium (VOLTAREN) 1 % GEL Apply 2 g topically 4 (four) times daily. 700 g 3  . dicyclomine (BENTYL) 10 MG capsule Take 1-2 tab 3 times daily AC as needed for spasms and cramping. 270 capsule 1  . diltiazem 2 % GEL Apply 1 application topically 2 (two) times daily as  needed (pain from fissure). 30 g 2  . DULoxetine (CYMBALTA) 30 MG capsule Take 3 capsules (90 mg total) by mouth daily. 90 capsule 5  . Erenumab-aooe (AIMOVIG) 140 MG/ML SOAJ Inject 140 mg into  the skin every 30 (thirty) days. 90 day supply 3 pen 3  . esomeprazole (NEXIUM) 40 MG capsule Take 1 capsule (40 mg total) by mouth daily before breakfast. 90 capsule 3  . fentaNYL (DURAGESIC - DOSED MCG/HR) 50 MCG/HR Place 50 mcg onto the skin every 3 (three) days.    . flurbiprofen (ANSAID) 100 MG tablet Take 1 tablet (100 mg total) by mouth every 8 (eight) hours as needed (maximum 3 tablets in 24 hours). 24 tablet 3  . Galcanezumab-gnlm (EMGALITY) 120 MG/ML SOAJ Inject 120 mg into the skin every 28 (twenty-eight) days. 3 pen 3  . glucagon 1 MG injection Inject 1 mg into the muscle once as needed for up to 1 dose. 1 each 12  . glucose blood (FREESTYLE TEST STRIPS) test strip Use to monitor glucose levels 4 times daily; E11.9 200 each 0  . HYDROcodone-acetaminophen (NORCO) 7.5-325 MG tablet Take 1 tablet every 6 (six) hours as needed by mouth for moderate pain.    . hypromellose (SYSTANE OVERNIGHT THERAPY) 0.3 % GEL ophthalmic ointment Place 1 drop into both eyes at bedtime. Reported on 02/11/2015    . Lancets (FREESTYLE) lancets Use to monitor glucose levels 4 times daily; E11.9 200 each 0  . lidocaine (LIDODERM) 5 % Place 1 patch onto the skin as needed. Remove & Discard patch within 12 hours. May dispense as 3 month supply 90 patch 3  . Lifitegrast (XIIDRA) 5 % SOLN Place 1 drop into both eyes daily.     . meloxicam (MOBIC) 15 MG tablet Take 1 tablet (15 mg total) by mouth daily. Prn pain 90 tablet 1  . metroNIDAZOLE (METROGEL) 1 % gel Apply topically daily. 45 g 0  . mirabegron ER (MYRBETRIQ) 25 MG TB24 tablet Take 1 tablet (25 mg total) by mouth daily. 90 tablet 1  . Multiple Vitamins-Minerals (PRESERVISION AREDS) CAPS Take 1 capsule by mouth 2 (two) times daily.    . naloxegol oxalate (MOVANTIK) 25 MG  TABS tablet Take 25 mg by mouth daily.    . Naloxone HCl 0.4 MG/0.4ML SOAJ Administer 0.64m [place] at first sign of opioid overdose and repeat every 2 minutes as needed for resuscitation. Call 911 immediately 5 Package 0  . NON FORMULARY [facility]  Peripheral Neuropathy Cream- Bupivacaine 1%, Doxepin 3%, Gabapentin 6%, Pentoxifylline 3%, Topiramate 1% Apply 1-2 grams to affected area 3-4 times daily Qty. 120 gm 3 refills    . ondansetron (ZOFRAN) 8 MG tablet Take 1 tablet (8 mg total) by mouth every 8 (eight) hours as needed for nausea or vomiting. 60 tablet 0  . pregabalin (LYRICA) 50 MG capsule Take 1 capsule (50 mg total) by mouth 3 (three) times daily. 90 day supply. 270 capsule 3  . Probiotic Product (PROBIOTIC-10 PO) Take by mouth.     . sucralfate (CARAFATE) 1 GM/10ML suspension Take 10 mLs (1 g total) by mouth 4 (four) times daily. 420 mL 1  . SUMAtriptan (IMITREX) 100 MG tablet Take 1 tablet at earliest onset of headache, may repeat in 2 hours if headache persists or reoccurs. 30 tablet 1  . tizanidine (ZANAFLEX) 2 MG capsule TAKE 1 CAPSULE (2 MG TOTAL) BY MOUTH 3 (THREE) TIMES DAILY.    . traZODone (DESYREL) 100 MG tablet Take 0.5-1 tablets (50-100 mg total) by mouth at bedtime as needed for sleep. And 1/2 -1 if wakes at 4am. 90 tablet 1  . valACYclovir (VALTREX) 1000 MG tablet Take 1 tablet (1,000 mg total) by mouth 3 (  three) times daily. 21 tablet 0  . Vitamin D, Ergocalciferol, (DRISDOL) 1.25 MG (50000 UT) CAPS capsule Take 1 capsule (50,000 Units total) by mouth every 7 (seven) days. 12 capsule 3   No current facility-administered medications on file prior to visit.    Allergies  Allergen Reactions  . Ambien [Zolpidem Tartrate]     Hallucinations  . Codeine Anaphylaxis  . Oxycodone-Acetaminophen Shortness Of Breath  . Tylox [Oxycodone-Acetaminophen] Anaphylaxis    Chest pain  . Darvocet [Propoxyphene N-Acetaminophen]     Chest pain  . Darvon [Propoxyphene] Itching  .  Lorcet [Hydrocodone-Acetaminophen]     Chest pain  . Opium     hallucinations  . Vicodin [Hydrocodone-Acetaminophen] Other (See Comments)    Chest Pain   . Wheat Bran   . Amoxicillin Nausea And Vomiting    Reaction:  Migraine headache  . Penicillins Nausea And Vomiting    Migraine Headaches    Family History  Problem Relation Age of Onset  . Stroke Mother   . Lung cancer Mother   . Heart disease Mother   . Diabetes Mother   . Hypertension Father   . Stroke Father   . Lung cancer Father   . Heart disease Father   . Kidney disease Father   . Seizures Father        epilepsy, and sisters x 2  . Pancreatic cancer Sister   . Lung cancer Sister   . Colon cancer Maternal Aunt        12 relatives  . Uterine cancer Other        aunt  . Heart disease Other        grandmother  . Clotting disorder Sister   . Heart disease Sister        x 2  . Kidney disease Sister        x 2  . Breast cancer Sister   . Colon cancer Paternal Aunt   . Colon cancer Paternal Aunt   . Esophageal cancer Neg Hx   . Rectal cancer Neg Hx   . Stomach cancer Neg Hx     BP 120/80   Pulse 95   Ht _0  (1.651 m)   Wt 110 lb (49.9 kg)   SpO2 97%   BMI 18.30 kg/m    Review of Systems Denies LOC.  Constipation persists.      Objective:   Physical Exam VITAL SIGNS:  See vs page.   GENERAL: no distress.      Lab Results  Component Value Date   HGBA1C 5.3 05/26/2019       Assessment & Plan:  Reactive hypoglycemia: worse Constipation: increasing acarbose will help.   Patient Instructions  Please come in to do fasting blood tests. I have sent a prescription to your pharmacy, to increase the acarbose.   Please come back for a follow-up appointment in 3 months.

## 2019-05-26 NOTE — Progress Notes (Signed)
Pt c/o dizziness. Gait unsteady. Assisted with ambulation to exam room. Asked pt to scan Libre sensor and advised I will obtain CBG for comparison. Libre = 83 and fingerstick CBG = 103. Advised Dr. Loanne Drilling of symptoms and results. Advised he will further eval.

## 2019-05-26 NOTE — Patient Instructions (Addendum)
Please come in to do fasting blood tests. I have sent a prescription to your pharmacy, to increase the acarbose.   Please come back for a follow-up appointment in 3 months.

## 2019-05-27 DIAGNOSIS — N3020 Other chronic cystitis without hematuria: Secondary | ICD-10-CM | POA: Diagnosis not present

## 2019-05-27 DIAGNOSIS — N3946 Mixed incontinence: Secondary | ICD-10-CM | POA: Diagnosis not present

## 2019-06-02 ENCOUNTER — Other Ambulatory Visit: Payer: Medicare Other

## 2019-06-02 NOTE — Addendum Note (Signed)
Addended by: Hardie Pulley, Vandora Jaskulski J on: 06/02/2019 07:32 AM   Modules accepted: Orders

## 2019-06-08 DIAGNOSIS — M5416 Radiculopathy, lumbar region: Secondary | ICD-10-CM | POA: Diagnosis not present

## 2019-06-08 DIAGNOSIS — G894 Chronic pain syndrome: Secondary | ICD-10-CM | POA: Diagnosis not present

## 2019-06-23 DIAGNOSIS — E1121 Type 2 diabetes mellitus with diabetic nephropathy: Secondary | ICD-10-CM | POA: Diagnosis not present

## 2019-06-23 DIAGNOSIS — R32 Unspecified urinary incontinence: Secondary | ICD-10-CM | POA: Diagnosis not present

## 2019-06-23 DIAGNOSIS — K588 Other irritable bowel syndrome: Secondary | ICD-10-CM | POA: Diagnosis not present

## 2019-06-23 DIAGNOSIS — G43001 Migraine without aura, not intractable, with status migrainosus: Secondary | ICD-10-CM | POA: Diagnosis not present

## 2019-06-23 DIAGNOSIS — E43 Unspecified severe protein-calorie malnutrition: Secondary | ICD-10-CM | POA: Diagnosis not present

## 2019-06-23 DIAGNOSIS — E11649 Type 2 diabetes mellitus with hypoglycemia without coma: Secondary | ICD-10-CM | POA: Diagnosis not present

## 2019-06-23 DIAGNOSIS — Q438 Other specified congenital malformations of intestine: Secondary | ICD-10-CM | POA: Diagnosis not present

## 2019-06-23 DIAGNOSIS — K1121 Acute sialoadenitis: Secondary | ICD-10-CM | POA: Diagnosis not present

## 2019-06-23 DIAGNOSIS — M130 Polyarthritis, unspecified: Secondary | ICD-10-CM | POA: Diagnosis not present

## 2019-06-23 DIAGNOSIS — K22719 Barrett's esophagus with dysplasia, unspecified: Secondary | ICD-10-CM | POA: Diagnosis not present

## 2019-06-29 DIAGNOSIS — M47812 Spondylosis without myelopathy or radiculopathy, cervical region: Secondary | ICD-10-CM | POA: Diagnosis not present

## 2019-07-22 ENCOUNTER — Other Ambulatory Visit: Payer: Self-pay

## 2019-07-22 ENCOUNTER — Telehealth: Payer: Self-pay | Admitting: Neurology

## 2019-07-22 MED ORDER — PREGABALIN 50 MG PO CAPS: 50.0000 mg | ORAL_CAPSULE | Freq: Three times a day (TID) | ORAL | 3 refills | Status: DC

## 2019-07-22 NOTE — Telephone Encounter (Signed)
Patient needs refill of Lyrica 50 mg sent to [facility] in [place].

## 2019-08-03 ENCOUNTER — Other Ambulatory Visit: Payer: Self-pay

## 2019-08-03 ENCOUNTER — Other Ambulatory Visit: Payer: Medicare Other

## 2019-08-03 ENCOUNTER — Ambulatory Visit: Payer: Medicare Other | Admitting: Endocrinology

## 2019-08-03 DIAGNOSIS — E11649 Type 2 diabetes mellitus with hypoglycemia without coma: Secondary | ICD-10-CM | POA: Diagnosis not present

## 2019-08-03 LAB — GLUCOSE, RANDOM: Glucose, Bld: 76 mg/dL (ref 70–99)

## 2019-08-04 LAB — INSULIN, RANDOM: Insulin: 7.8 u[IU]/mL

## 2019-08-04 LAB — C-PEPTIDE: C-Peptide: 2.64 ng/mL (ref 0.80–3.85)

## 2019-08-09 ENCOUNTER — Ambulatory Visit: Payer: Medicare Other

## 2019-08-09 ENCOUNTER — Other Ambulatory Visit: Payer: Medicare Other

## 2019-08-09 DIAGNOSIS — F339 Major depressive disorder, recurrent, unspecified: Secondary | ICD-10-CM | POA: Diagnosis not present

## 2019-08-09 DIAGNOSIS — E11649 Type 2 diabetes mellitus with hypoglycemia without coma: Secondary | ICD-10-CM | POA: Diagnosis not present

## 2019-08-09 DIAGNOSIS — M545 Low back pain: Secondary | ICD-10-CM | POA: Diagnosis not present

## 2019-08-09 DIAGNOSIS — Q438 Other specified congenital malformations of intestine: Secondary | ICD-10-CM | POA: Diagnosis not present

## 2019-08-09 DIAGNOSIS — E43 Unspecified severe protein-calorie malnutrition: Secondary | ICD-10-CM | POA: Diagnosis not present

## 2019-08-09 DIAGNOSIS — F419 Anxiety disorder, unspecified: Secondary | ICD-10-CM | POA: Diagnosis not present

## 2019-08-09 DIAGNOSIS — K22719 Barrett's esophagus with dysplasia, unspecified: Secondary | ICD-10-CM | POA: Diagnosis not present

## 2019-08-09 DIAGNOSIS — M130 Polyarthritis, unspecified: Secondary | ICD-10-CM | POA: Diagnosis not present

## 2019-08-09 DIAGNOSIS — E1121 Type 2 diabetes mellitus with diabetic nephropathy: Secondary | ICD-10-CM | POA: Diagnosis not present

## 2019-08-09 DIAGNOSIS — R32 Unspecified urinary incontinence: Secondary | ICD-10-CM | POA: Diagnosis not present

## 2019-08-09 DIAGNOSIS — G43911 Migraine, unspecified, intractable, with status migrainosus: Secondary | ICD-10-CM | POA: Diagnosis not present

## 2019-08-09 DIAGNOSIS — K588 Other irritable bowel syndrome: Secondary | ICD-10-CM | POA: Diagnosis not present

## 2019-08-13 ENCOUNTER — Telehealth (INDEPENDENT_AMBULATORY_CARE_PROVIDER_SITE_OTHER): Payer: Medicare Other | Admitting: Family Medicine

## 2019-08-13 DIAGNOSIS — M542 Cervicalgia: Secondary | ICD-10-CM

## 2019-08-13 NOTE — Patient Instructions (Signed)
° ° ° °  If you have lab work done today you will be contacted with your lab results within the next 2 weeks.  If you have not heard from us then please contact us. The fastest way to get your results is to register for My Chart. ° ° °IF you received an x-ray today, you will receive an invoice from [facility]. Please contact [facility] at [phone] with questions or concerns regarding your invoice.  ° °IF you received labwork today, you will receive an invoice from [facility]. Please contact [facility] at [phone] with questions or concerns regarding your invoice.  ° °Our billing staff will not be able to assist you with questions regarding bills from these companies. ° °You will be contacted with the lab results as soon as they are available. The fastest way to get your results is to activate your My Chart account. Instructions are located on the last page of this paperwork. If you have not heard from us regarding the results in 2 weeks, please contact this office. °  ° ° ° °

## 2019-08-13 NOTE — Progress Notes (Signed)
Virtual Visit Note  I connected with patient on 08/13/19 at 601pm by phone (due to technical difficulties) and verified that I am speaking with the correct person using two identifiers. Kayla [name] is currently located at home and patient is currently with them during visit. The provider, Rutherford Guys, MD is located in their office at time of visit.  I discussed the limitations, risks, security and privacy concerns of performing an evaluation and management service by telephone and the availability of in person appointments. I also discussed with the patient that there may be a patient responsible charge related to this service. The patient expressed understanding and agreed to proceed.   I provided 13 minutes of non-face-to-face time during this encounter.  Chief Complaint  Patient presents with  . Sore Throat    onset yesterday, pain is getting worse, very hard swallow, using salt water and pain pill to take "edge off". Feel she has not been exposed to the virus.    HPI   She had laryngitis for about 5 days And then yesterday started having pain around [place], a bit to the left Getting worse, pain with swallowing, no SOB When she looks at the mirror, it does swollen, no redness, mild warm No sweats, no palpitations Has increased loose stools No increase in BP Has chronic reflux, has roux y bypass, sleeps with elevated HOB Has h/o esophageal stenosis She has increased edema in her legs - she does not take diuretics due to usually very low BP (per cards instruction) DM with severe neuropathy and back pain with spinal implant PCP Dr Brigitte Pulse ? covid vaccine in April 2021  Allergies  Allergen Reactions  . Ambien [Zolpidem Tartrate]     Hallucinations  . Codeine Anaphylaxis  . Oxycodone-Acetaminophen Shortness Of Breath  . Tylox [Oxycodone-Acetaminophen] Anaphylaxis    Chest pain  . Darvocet [Propoxyphene N-Acetaminophen]     Chest pain  . Darvon [Propoxyphene]  Itching  . Lorcet [Hydrocodone-Acetaminophen]     Chest pain  . Opium     hallucinations  . Vicodin [Hydrocodone-Acetaminophen] Other (See Comments)    Chest Pain   . Wheat Bran   . Amoxicillin Nausea And Vomiting    Reaction:  Migraine headache  . Penicillins Nausea And Vomiting    Migraine Headaches    Prior to Admission medications   Medication Sig Start Date End Date Taking? Authorizing Provider  acarbose (PRECOSE) 50 MG tablet Take 1 tablet (50 mg total) by mouth 3 (three) times daily with meals. 05/26/19  Yes Renato Shin, MD  aspirin-acetaminophen-caffeine [facility] MIGRAINE) [phone] MG tablet Take 2 tablets by mouth 2 (two) times daily.   Yes [provider]  Blood Glucose Monitoring Suppl (FREESTYLE FREEDOM LITE) w/Device KIT 1 each by Does not apply route 4 (four) times daily. Use to monitor glucose levels 4 times daily; E11.9 08/03/18  Yes Renato Shin, MD  clonazePAM (KLONOPIN) 1 MG tablet Take 0.5-1 tablets (0.5-1 mg total) by mouth 2 (two) times daily as needed for anxiety. 10/20/18  Yes Forrest Moron, MD  Continuous Blood Gluc Receiver (FREESTYLE LIBRE 2 DAY READER) [place] 1 each by Does not apply route See admin instructions. Use with sensors to monitor glucose levels; E11.9 08/03/18  Yes Renato Shin, MD  Continuous Blood Gluc Sensor (FREESTYLE LIBRE 14 DAY SENSOR) MISC 1 Device by Does not apply route every 14 (fourteen) days. 03/30/19  Yes Renato Shin, MD  cyclobenzaprine (FLEXERIL) 10 MG tablet Take 1 tablet (10  mg total) by mouth 3 (three) times daily as needed for muscle spasms. 90 day supply 10/16/18  Yes Jaffe, Adam R, DO  cycloSPORINE (RESTASIS) 0.05 % ophthalmic emulsion Place 1 drop into both eyes 2 (two) times daily. 11/08/16  Yes Shawnee Knapp, MD  diclofenac sodium (VOLTAREN) 1 % GEL Apply 2 g topically 4 (four) times daily. 10/20/18  Yes Stallings, Zoe A, MD  dicyclomine (BENTYL) 10 MG capsule Take 1-2 tab 3 times daily AC as needed for spasms and  cramping. 10/20/18  Yes Stallings, Zoe A, MD  diltiazem 2 % GEL Apply 1 application topically 2 (two) times daily as needed (pain from fissure). 10/05/16  Yes Shawnee Knapp, MD  DULoxetine (CYMBALTA) 30 MG capsule Take 3 capsules (90 mg total) by mouth daily. 10/20/18  Yes Stallings, Zoe A, MD  Erenumab-aooe (AIMOVIG) 140 MG/ML SOAJ Inject 140 mg into the skin every 30 (thirty) days. 90 day supply 10/16/18  Yes Jaffe, Adam R, DO  esomeprazole (NEXIUM) 40 MG capsule Take 1 capsule (40 mg total) by mouth daily before breakfast. 10/16/18  Yes Gatha Mayer, MD  fentaNYL (DURAGESIC - DOSED MCG/HR) 50 MCG/HR Place 50 mcg onto the skin every 3 (three) days.   Yes [provider]  flurbiprofen (ANSAID) 100 MG tablet Take 1 tablet (100 mg total) by mouth every 8 (eight) hours as needed (maximum 3 tablets in 24 hours). 07/01/18  Yes Jaffe, Adam R, DO  Galcanezumab-gnlm (EMGALITY) 120 MG/ML SOAJ Inject 120 mg into the skin every 28 (twenty-eight) days. 03/16/19  Yes Jaffe, Adam R, DO  glucagon 1 MG injection Inject 1 mg into the muscle once as needed for up to 1 dose. 05/12/19  Yes Renato Shin, MD  glucose blood (FREESTYLE TEST STRIPS) test strip Use to monitor glucose levels 4 times daily; E11.9 08/03/18  Yes Renato Shin, MD  HYDROcodone-acetaminophen (NORCO) 7.5-325 MG tablet Take 1 tablet every 6 (six) hours as needed by mouth for moderate pain.   Yes [provider]  hypromellose (SYSTANE OVERNIGHT THERAPY) 0.3 % GEL ophthalmic ointment Place 1 drop into both eyes at bedtime. Reported on 02/11/2015   Yes [provider]  Lancets (FREESTYLE) lancets Use to monitor glucose levels 4 times daily; E11.9 08/03/18  Yes Renato Shin, MD  lidocaine (LIDODERM) 5 % Place 1 patch onto the skin as needed. Remove & Discard patch within 12 hours. May dispense as 3 month supply 10/20/18  Yes Stallings, Zoe A, MD  Lifitegrast (XIIDRA) 5 % SOLN Place 1 drop into both eyes daily.    Yes [provider]  meloxicam (MOBIC) 15 MG tablet Take 1 tablet (15 mg total) by mouth daily. Prn pain 02/11/18  Yes Shawnee Knapp, MD  metroNIDAZOLE (METROGEL) 1 % gel Apply topically daily. 10/20/18  Yes Forrest Moron, MD  mirabegron ER (MYRBETRIQ) 25 MG TB24 tablet Take 1 tablet (25 mg total) by mouth daily. 10/20/18  Yes Forrest Moron, MD  Multiple Vitamins-Minerals (PRESERVISION AREDS) CAPS Take 1 capsule by mouth 2 (two) times daily.   Yes [provider]  naloxegol oxalate (MOVANTIK) 25 MG TABS tablet Take 25 mg by mouth daily.   Yes [provider]  Naloxone HCl 0.4 MG/0.4ML SOAJ Administer 0.35m Medulla at first sign of opioid overdose and repeat every 2 minutes as needed for resuscitation. Call 911 immediately 03/20/16  Yes SShawnee Knapp MD  [facility] Peripheral Neuropathy Cream- Bupivacaine 1%, Doxepin 3%, Gabapentin  6%, Pentoxifylline 3%, Topiramate 1% Apply 1-2 grams to affected area 3-4 times daily Qty. 120 gm 3 refills   Yes [provider]  ondansetron (ZOFRAN) 8 MG tablet Take 1 tablet (8 mg total) by mouth every 8 (eight) hours as needed for nausea or vomiting. 10/20/18  Yes Stallings, Zoe A, MD  pregabalin (LYRICA) 50 MG capsule Take 1 capsule (50 mg total) by mouth 3 (three) times daily. 90 day supply. 07/22/19  Yes Jaffe, Adam R, DO  Probiotic Product (PROBIOTIC-10 PO) Take by mouth.    Yes [provider]  sucralfate (CARAFATE) 1 GM/10ML suspension Take 10 mLs (1 g total) by mouth 4 (four) times daily. 10/22/18  Yes Gatha Mayer, MD  SUMAtriptan (IMITREX) 100 MG tablet Take 1 tablet at earliest onset of headache, may repeat in 2 hours if headache persists or reoccurs. 10/16/18  Yes Jaffe, Adam R, DO  tizanidine (ZANAFLEX) 2 MG capsule TAKE 1 CAPSULE (2 MG TOTAL) BY MOUTH 3 (THREE) TIMES DAILY. 10/10/17  Yes [provider]  traZODone (DESYREL) 100 MG tablet Take 0.5-1 tablets (50-100 mg total) by mouth at bedtime as needed for sleep.  And 1/2 -1 if wakes at 4am. 10/20/18  Yes Stallings, Zoe A, MD  valACYclovir (VALTREX) 1000 MG tablet Take 1 tablet (1,000 mg total) by mouth 3 (three) times daily. 03/19/17  Yes Shawnee Knapp, MD  Vitamin D, Ergocalciferol, (DRISDOL) 1.25 MG (50000 UT) CAPS capsule Take 1 capsule (50,000 Units total) by mouth every 7 (seven) days. 10/20/18  Yes Forrest Moron, MD    Past Medical History:  Diagnosis Date  . Allergy   . Anal fissure 10/08/2016  . Anemia    anemia in the past  . Angina    takes Propanolol prn and it stops her chest  . Anxiety   . Barrett esophagus    not consistently present  . Cataract   . Chronic kidney disease    kidney stones and UTI  . Complication of anesthesia    either  BP or pulse dropped with last surgery  . DDD (degenerative disc disease)    cervical and lumbar  . Dementia ([place])   . Depression    post traumatic stress  disorder  . Diabetes ([place]) 11/18/2016  . Diabetes mellitus    sugar goes up and down diet controlled  . Dysrhythmia    brought on by stress  . External hemorrhoids   . Fatigue   . Fibromyalgia    neuropathy knees ankles and toes, bladder  . GERD (gastroesophageal reflux disease)   . H/O hyperparathyroidism   . Headache(784.0)    migraines  . Hiatal hernia   . History of kidney stones   . Hypertension    3 small brain aneurysms  . Hypoglycemia 11/08/2016  . IBS (irritable bowel syndrome)   . Internal hemorrhoids   . Macular degeneration   . Migraines   . Mitral regurgitation 12/11/2015  . Osteoarthritis    all over  . Osteopetrosis   . Peripheral vascular disease ([facility])    superficial phlebitis in left calf  . Personal history of colonic polyps 07/22/2013   07/2013 - 3 small adenomas - repeat colonoscopy 2018  . Small intestinal bacterial overgrowth 08/05/2017   + lactulose breath test 06/2017 Xifaxan Rx    Past Surgical History:  Procedure Laterality Date  . ABDOMINAL HYSTERECTOMY    . APPENDECTOMY    . CARDIAC  CATHETERIZATION  03/22/1991   EF 61%  .  CARDIOVASCULAR STRESS TEST  10/03/2006  . CHOLECYSTECTOMY    . COLONOSCOPY  06/23/2008   normal  . DILATION AND CURETTAGE OF UTERUS     x2  . ESOPHAGUS SURGERY    . EXPLORATORY LAPAROTOMY  1993  . EYE SURGERY    . GASTRIC BYPASS  1999  . GASTRIC RESTRICTION SURGERY  1991  . LASIK Bilateral   . Right Arm Surgery    . Right Knee Arthroscopy    . Rt wrist fx  2009  . spinal surgery battery implant    . stomach stappeling  1991  . STONE EXTRACTION WITH BASKET  2012  . TONSILLECTOMY    . TUBAL LIGATION    . UPPER GASTROINTESTINAL ENDOSCOPY  06/23/2008   w/Dil, Barrett's esophagus  . US ECHOCARDIOGRAPHY  01/21/2007   EF 55-60%  . US ECHOCARDIOGRAPHY  08/30/2003   EF 55-60%    Social History   Tobacco Use  . Smoking status: Never Smoker  . Smokeless tobacco: Never Used  Substance Use Topics  . Alcohol use: No    Family History  Problem Relation Age of Onset  . Stroke Mother   . Lung cancer Mother   . Heart disease Mother   . Diabetes Mother   . Hypertension Father   . Stroke Father   . Lung cancer Father   . Heart disease Father   . Kidney disease Father   . Seizures Father        epilepsy, and sisters x 2  . Pancreatic cancer Sister   . Lung cancer Sister   . Colon cancer Maternal Aunt        12 relatives  . Uterine cancer Other        aunt  . Heart disease Other        grandmother  . Clotting disorder Sister   . Heart disease Sister        x 2  . Kidney disease Sister        x 2  . Breast cancer Sister   . Colon cancer Paternal Aunt   . Colon cancer Paternal Aunt   . Esophageal cancer Neg Hx   . Rectal cancer Neg Hx   . Stomach cancer Neg Hx     ROS Per hpi  Objective  Vitals as reported by the patient: none  Gen: AAOx3, NAD Speaking in full sentences wo difficulties   ASSESSMENT and PLAN  1. Anterior neck pain History suggestive of thyroiditis. Patient appears to be clinically stable. Discussed patient  to followup with PCP early next week. Discussed ER precautions.      The above assessment and management plan was discussed with the patient. The patient verbalized understanding of and has agreed to the management plan. Patient is aware to call the clinic if symptoms persist or worsen. Patient is aware when to return to the clinic for a follow-up visit. Patient educated on when it is appropriate to go to the emergency department.     Rutherford Guys, MD [facility] [place], [place] [address] Ph.  [phone] Fax [phone]

## 2019-08-17 DIAGNOSIS — E162 Hypoglycemia, unspecified: Secondary | ICD-10-CM | POA: Diagnosis not present

## 2019-08-17 DIAGNOSIS — J029 Acute pharyngitis, unspecified: Secondary | ICD-10-CM | POA: Diagnosis not present

## 2019-08-17 DIAGNOSIS — M130 Polyarthritis, unspecified: Secondary | ICD-10-CM | POA: Diagnosis not present

## 2019-08-17 DIAGNOSIS — R251 Tremor, unspecified: Secondary | ICD-10-CM | POA: Diagnosis not present

## 2019-08-17 DIAGNOSIS — M25572 Pain in left ankle and joints of left foot: Secondary | ICD-10-CM | POA: Diagnosis not present

## 2019-08-17 DIAGNOSIS — Q438 Other specified congenital malformations of intestine: Secondary | ICD-10-CM | POA: Diagnosis not present

## 2019-08-17 DIAGNOSIS — E1121 Type 2 diabetes mellitus with diabetic nephropathy: Secondary | ICD-10-CM | POA: Diagnosis not present

## 2019-08-17 DIAGNOSIS — K22719 Barrett's esophagus with dysplasia, unspecified: Secondary | ICD-10-CM | POA: Diagnosis not present

## 2019-08-17 DIAGNOSIS — M25571 Pain in right ankle and joints of right foot: Secondary | ICD-10-CM | POA: Diagnosis not present

## 2019-08-17 DIAGNOSIS — E43 Unspecified severe protein-calorie malnutrition: Secondary | ICD-10-CM | POA: Diagnosis not present

## 2019-08-17 DIAGNOSIS — R601 Generalized edema: Secondary | ICD-10-CM | POA: Diagnosis not present

## 2019-08-17 DIAGNOSIS — K588 Other irritable bowel syndrome: Secondary | ICD-10-CM | POA: Diagnosis not present

## 2019-08-17 DIAGNOSIS — E11649 Type 2 diabetes mellitus with hypoglycemia without coma: Secondary | ICD-10-CM | POA: Diagnosis not present

## 2019-08-17 DIAGNOSIS — E060 Acute thyroiditis: Secondary | ICD-10-CM | POA: Diagnosis not present

## 2019-08-17 DIAGNOSIS — R32 Unspecified urinary incontinence: Secondary | ICD-10-CM | POA: Diagnosis not present

## 2019-08-17 DIAGNOSIS — R590 Localized enlarged lymph nodes: Secondary | ICD-10-CM | POA: Diagnosis not present

## 2019-08-17 DIAGNOSIS — G43001 Migraine without aura, not intractable, with status migrainosus: Secondary | ICD-10-CM | POA: Diagnosis not present

## 2019-08-19 ENCOUNTER — Other Ambulatory Visit: Payer: Self-pay | Admitting: Family Medicine

## 2019-08-19 DIAGNOSIS — E041 Nontoxic single thyroid nodule: Secondary | ICD-10-CM

## 2019-08-25 ENCOUNTER — Ambulatory Visit: Payer: Medicare Other | Admitting: Physician Assistant

## 2019-08-25 ENCOUNTER — Ambulatory Visit
Admission: RE | Admit: 2019-08-25 | Discharge: 2019-08-25 | Disposition: A | Discharge status: Home / Self Care | Payer: Medicare Other | Source: Ambulatory Visit | Attending: Family Medicine | Admitting: Family Medicine

## 2019-08-25 ENCOUNTER — Other Ambulatory Visit: Payer: Self-pay | Admitting: Family Medicine

## 2019-08-25 ENCOUNTER — Encounter: Payer: Self-pay | Admitting: Genetic Counselor

## 2019-08-25 DIAGNOSIS — E041 Nontoxic single thyroid nodule: Secondary | ICD-10-CM

## 2019-08-25 DIAGNOSIS — S9781XA Crushing injury of right foot, initial encounter: Secondary | ICD-10-CM | POA: Diagnosis not present

## 2019-08-25 DIAGNOSIS — T1490XA Injury, unspecified, initial encounter: Secondary | ICD-10-CM

## 2019-08-25 DIAGNOSIS — E079 Disorder of thyroid, unspecified: Secondary | ICD-10-CM | POA: Diagnosis not present

## 2019-08-26 ENCOUNTER — Other Ambulatory Visit: Payer: Medicare Other

## 2019-08-27 ENCOUNTER — Telehealth: Payer: Self-pay

## 2019-08-27 NOTE — Telephone Encounter (Signed)
The patient was in the office today with her husband for his office visit with Dr. Acie Fredrickson. While here, she reported she is being treated for thyroid problems and asked for a sooner appointment with Dr. Acie Fredrickson (she is due April 2022). When instructed to contact the physician who is managing her thyroid issues, she stated she also has experienced swelling and intermittent angina. Scheduled patient for check-up with Dr. Acie Fredrickson 9/7. ER precautions reviewed.

## 2019-09-02 ENCOUNTER — Ambulatory Visit (INDEPENDENT_AMBULATORY_CARE_PROVIDER_SITE_OTHER): Payer: Medicare Other | Admitting: Endocrinology

## 2019-09-02 ENCOUNTER — Encounter: Payer: Self-pay | Admitting: Endocrinology

## 2019-09-02 ENCOUNTER — Other Ambulatory Visit: Payer: Self-pay

## 2019-09-02 VITALS — BP 100/62 | HR 106 | Ht 65.0 in | Wt 101.4 lb

## 2019-09-02 DIAGNOSIS — E11649 Type 2 diabetes mellitus with hypoglycemia without coma: Secondary | ICD-10-CM

## 2019-09-02 LAB — POCT GLYCOSYLATED HEMOGLOBIN (HGB A1C): Hemoglobin A1C: 5.2 % (ref 4.0–5.6)

## 2019-09-02 NOTE — Progress Notes (Signed)
Subjective:    Patient ID: Kayla [name], female    DOB: 06/18/1945, 74 y.o.   MRN: [id number]  HPI Pt returns for f/u of reactive hypoglycemia (she had gastric bypass surgery in 1999 (she weight 170 prior--she says surg was done for Barrett's esophagus, not obesity), but she reports intermittent hypoglycemia since 1979; no other cause was found; she takes acarbose; edema precludes pioglitazone).  She also reports ongoing pain at both feet.  continuous glucose monitor is not working recently, but she says cbg varies from 44-320. she reports drowsiness, headache, sweating, and tremor, even when cbg is in the 70's.  She feels like acarbose has reduced hypoglycemia.  She now has hypoglycemia approx qid.   Past Medical History:  Diagnosis Date  . Allergy   . Anal fissure 10/08/2016  . Anemia    anemia in the past  . Angina    takes Propanolol prn and it stops her chest  . Anxiety   . Barrett esophagus    not consistently present  . Cataract   . Chronic kidney disease    kidney stones and UTI  . Complication of anesthesia    either  BP or pulse dropped with last surgery  . DDD (degenerative disc disease)    cervical and lumbar  . Dementia ([place])   . Depression    post traumatic stress  disorder  . Diabetes ([place]) 11/18/2016  . Diabetes mellitus    sugar goes up and down diet controlled  . Dysrhythmia    brought on by stress  . External hemorrhoids   . Fatigue   . Fibromyalgia    neuropathy knees ankles and toes, bladder  . GERD (gastroesophageal reflux disease)   . H/O hyperparathyroidism   . Headache(784.0)    migraines  . Hiatal hernia   . History of kidney stones   . Hypertension    3 small brain aneurysms  . Hypoglycemia 11/08/2016  . IBS (irritable bowel syndrome)   . Internal hemorrhoids   . Macular degeneration   . Migraines   . Mitral regurgitation 12/11/2015  . Osteoarthritis    all over  . Osteopetrosis   . Peripheral vascular disease ([facility])    superficial  phlebitis in left calf  . Personal history of colonic polyps 07/22/2013   07/2013 - 3 small adenomas - repeat colonoscopy 2018  . Small intestinal bacterial overgrowth 08/05/2017   + lactulose breath test 06/2017 Xifaxan Rx    Past Surgical History:  Procedure Laterality Date  . ABDOMINAL HYSTERECTOMY    . APPENDECTOMY    . CARDIAC CATHETERIZATION  03/22/1991   EF 61%  . CARDIOVASCULAR STRESS TEST  10/03/2006  . CHOLECYSTECTOMY    . COLONOSCOPY  06/23/2008   normal  . DILATION AND CURETTAGE OF UTERUS     x2  . ESOPHAGUS SURGERY    . EXPLORATORY LAPAROTOMY  1993  . EYE SURGERY    . GASTRIC BYPASS  1999  . GASTRIC RESTRICTION SURGERY  1991  . LASIK Bilateral   . Right Arm Surgery    . Right Knee Arthroscopy    . Rt wrist fx  2009  . spinal surgery battery implant    . stomach stappeling  1991  . STONE EXTRACTION WITH BASKET  2012  . TONSILLECTOMY    . TUBAL LIGATION    . UPPER GASTROINTESTINAL ENDOSCOPY  06/23/2008   w/Dil, Barrett's esophagus  . US ECHOCARDIOGRAPHY  01/21/2007   EF 55-60%  . US ECHOCARDIOGRAPHY  08/30/2003   EF 55-60%    Social History   Socioeconomic History  . Marital status: Married    Spouse name: Rushie Chestnut.  . Number of children: 2  . Years of education: Not on file  . Highest education level: Not on file  Occupational History  . Occupation: Disability    Employer: RETIRED  Tobacco Use  . Smoking status: Never Smoker  . Smokeless tobacco: Never Used  Vaping Use  . Vaping Use: Never used  Substance and Sexual Activity  . Alcohol use: No  . Drug use: No  . Sexual activity: Not Currently  Other Topics Concern  . Not on file  Social History Narrative   Right handed   Two story home   She retired on disability   Married   2 Children   Social Determinants of Health   Financial Resource Strain:   . Difficulty of Paying Living Expenses: Not on file  Food Insecurity:   . Worried About Charity fundraiser in the Last Year: Not on file  . Ran Out  of Food in the Last Year: Not on file  Transportation Needs:   . Lack of Transportation (Medical): Not on file  . Lack of Transportation (Non-Medical): Not on file  Physical Activity:   . Days of Exercise per Week: Not on file  . Minutes of Exercise per Session: Not on file  Stress:   . Feeling of Stress : Not on file  Social Connections:   . Frequency of Communication with Friends and Family: Not on file  . Frequency of Social Gatherings with Friends and Family: Not on file  . Attends Religious Services: Not on file  . Active Member of Clubs or Organizations: Not on file  . Attends Archivist Meetings: Not on file  . Marital Status: Not on file  Intimate Partner Violence:   . Fear of Current or Ex-Partner: Not on file  . Emotionally Abused: Not on file  . Physically Abused: Not on file  . Sexually Abused: Not on file    Current Outpatient Medications on File Prior to Visit  Medication Sig Dispense Refill  . acarbose (PRECOSE) 50 MG tablet Take 1 tablet (50 mg total) by mouth 3 (three) times daily with meals. 270 tablet 3  . aspirin-acetaminophen-caffeine (EXCEDRIN MIGRAINE) 250-250-65 MG tablet Take 2 tablets by mouth 2 (two) times daily.    . Blood Glucose Monitoring Suppl (FREESTYLE FREEDOM LITE) w/Device KIT 1 each by Does not apply route 4 (four) times daily. Use to monitor glucose levels 4 times daily; E11.9 1 kit 0  . clonazePAM (KLONOPIN) 1 MG tablet Take 0.5-1 tablets (0.5-1 mg total) by mouth 2 (two) times daily as needed for anxiety. 180 tablet 0  . Continuous Blood Gluc Receiver (FREESTYLE LIBRE 14 DAY READER) DEVI 1 each by Does not apply route See admin instructions. Use with sensors to monitor glucose levels; E11.9 1 each 0  . Continuous Blood Gluc Sensor (FREESTYLE LIBRE 14 DAY SENSOR) MISC 1 Device by Does not apply route every 14 (fourteen) days. 6 each 3  . cyclobenzaprine (FLEXERIL) 10 MG tablet Take 1 tablet (10 mg total) by mouth 3 (three) times daily as  needed for muscle spasms. 90 day supply 270 tablet 3  . cycloSPORINE (RESTASIS) 0.05 % ophthalmic emulsion Place 1 drop into both eyes 2 (two) times daily. 5.5 mL 5  . diclofenac sodium (VOLTAREN) 1 % GEL Apply 2 g topically 4 (four) times daily.  700 g 3  . dicyclomine (BENTYL) 10 MG capsule Take 1-2 tab 3 times daily AC as needed for spasms and cramping. 270 capsule 1  . diltiazem 2 % GEL Apply 1 application topically 2 (two) times daily as needed (pain from fissure). 30 g 2  . DULoxetine (CYMBALTA) 30 MG capsule Take 3 capsules (90 mg total) by mouth daily. 90 capsule 5  . Erenumab-aooe (AIMOVIG) 140 MG/ML SOAJ Inject 140 mg into the skin every 30 (thirty) days. 90 day supply 3 pen 3  . esomeprazole (NEXIUM) 40 MG capsule Take 1 capsule (40 mg total) by mouth daily before breakfast. 90 capsule 3  . fentaNYL (DURAGESIC - DOSED MCG/HR) 50 MCG/HR Place 50 mcg onto the skin every 3 (three) days.    . flurbiprofen (ANSAID) 100 MG tablet Take 1 tablet (100 mg total) by mouth every 8 (eight) hours as needed (maximum 3 tablets in 24 hours). 24 tablet 3  . Galcanezumab-gnlm (EMGALITY) 120 MG/ML SOAJ Inject 120 mg into the skin every 28 (twenty-eight) days. 3 pen 3  . glucagon 1 MG injection Inject 1 mg into the muscle once as needed for up to 1 dose. 1 each 12  . glucose blood (FREESTYLE TEST STRIPS) test strip Use to monitor glucose levels 4 times daily; E11.9 200 each 0  . HYDROcodone-acetaminophen (NORCO) 7.5-325 MG tablet Take 1 tablet every 6 (six) hours as needed by mouth for moderate pain.    . hypromellose (SYSTANE OVERNIGHT THERAPY) 0.3 % GEL ophthalmic ointment Place 1 drop into both eyes at bedtime. Reported on 02/11/2015    . Lancets (FREESTYLE) lancets Use to monitor glucose levels 4 times daily; E11.9 200 each 0  . lidocaine (LIDODERM) 5 % Place 1 patch onto the skin as needed. Remove & Discard patch within 12 hours. May dispense as 3 month supply 90 patch 3  . Lifitegrast (XIIDRA) 5 % SOLN  Place 1 drop into both eyes daily.     . meloxicam (MOBIC) 15 MG tablet Take 1 tablet (15 mg total) by mouth daily. Prn pain 90 tablet 1  . metroNIDAZOLE (METROGEL) 1 % gel Apply topically daily. 45 g 0  . mirabegron ER (MYRBETRIQ) 25 MG TB24 tablet Take 1 tablet (25 mg total) by mouth daily. 90 tablet 1  . Multiple Vitamins-Minerals (PRESERVISION AREDS) CAPS Take 1 capsule by mouth 2 (two) times daily.    . naloxegol oxalate (MOVANTIK) 25 MG TABS tablet Take 25 mg by mouth daily.    . Naloxone HCl 0.4 MG/0.4ML SOAJ Administer 0.54m [place] at first sign of opioid overdose and repeat every 2 minutes as needed for resuscitation. Call 911 immediately 5 Package 0  . NON FORMULARY [facility]  Peripheral Neuropathy Cream- Bupivacaine 1%, Doxepin 3%, Gabapentin 6%, Pentoxifylline 3%, Topiramate 1% Apply 1-2 grams to affected area 3-4 times daily Qty. 120 gm 3 refills    . ondansetron (ZOFRAN) 8 MG tablet Take 1 tablet (8 mg total) by mouth every 8 (eight) hours as needed for nausea or vomiting. 60 tablet 0  . pregabalin (LYRICA) 50 MG capsule Take 1 capsule (50 mg total) by mouth 3 (three) times daily. 90 day supply. 270 capsule 3  . Probiotic Product (PROBIOTIC-10 PO) Take by mouth.     . sucralfate (CARAFATE) 1 GM/10ML suspension Take 10 mLs (1 g total) by mouth 4 (four) times daily. 420 mL 1  . SUMAtriptan (IMITREX) 100 MG tablet Take 1 tablet at earliest onset of headache, may repeat in  2 hours if headache persists or reoccurs. 30 tablet 1  . tizanidine (ZANAFLEX) 2 MG capsule TAKE 1 CAPSULE (2 MG TOTAL) BY MOUTH 3 (THREE) TIMES DAILY.    . traZODone (DESYREL) 100 MG tablet Take 0.5-1 tablets (50-100 mg total) by mouth at bedtime as needed for sleep. And 1/2 -1 if wakes at 4am. 90 tablet 1  . valACYclovir (VALTREX) 1000 MG tablet Take 1 tablet (1,000 mg total) by mouth 3 (three) times daily. 21 tablet 0  . Vitamin D, Ergocalciferol, (DRISDOL) 1.25 MG (50000 UT) CAPS capsule Take 1 capsule  (50,000 Units total) by mouth every 7 (seven) days. 12 capsule 3   No current facility-administered medications on file prior to visit.    Allergies  Allergen Reactions  . Ambien [Zolpidem Tartrate]     Hallucinations  . Codeine Anaphylaxis  . Oxycodone-Acetaminophen Shortness Of Breath  . Tylox [Oxycodone-Acetaminophen] Anaphylaxis    Chest pain  . Darvocet [Propoxyphene N-Acetaminophen]     Chest pain  . Darvon [Propoxyphene] Itching  . Lorcet [Hydrocodone-Acetaminophen]     Chest pain  . Opium     hallucinations  . Vicodin [Hydrocodone-Acetaminophen] Other (See Comments)    Chest Pain   . Wheat Bran   . Amoxicillin Nausea And Vomiting    Reaction:  Migraine headache  . Penicillins Nausea And Vomiting    Migraine Headaches    Family History  Problem Relation Age of Onset  . Stroke Mother   . Lung cancer Mother   . Heart disease Mother   . Diabetes Mother   . Hypertension Father   . Stroke Father   . Lung cancer Father   . Heart disease Father   . Kidney disease Father   . Seizures Father        epilepsy, and sisters x 2  . Pancreatic cancer Sister   . Lung cancer Sister   . Colon cancer Maternal Aunt        12 relatives  . Uterine cancer Other        aunt  . Heart disease Other        grandmother  . Clotting disorder Sister   . Heart disease Sister        x 2  . Kidney disease Sister        x 2  . Breast cancer Sister   . Colon cancer Paternal Aunt   . Colon cancer Paternal Aunt   . Esophageal cancer Neg Hx   . Rectal cancer Neg Hx   . Stomach cancer Neg Hx     BP 100/62   Pulse (!) 106   Ht 5' 5" (1.651 m)   Wt 101 lb 6.4 oz (46 kg)   SpO2 98%   BMI 16.87 kg/m    Review of Systems Denies LOC.      Objective:   Physical Exam VITAL SIGNS:  See vs page GENERAL: no distress Pulses: dorsalis pedis intact bilat.   MSK: no deformity of the feet CV: 1+ bilat leg edema.   Skin:  no ulcer on the feet.  normal color and temp on the  feet. Neuro: sensation is intact to touch on the feet   Lab Results  Component Value Date   HGBA1C 5.2 09/02/2019       Assessment & Plan:  Reactive hypoglycemia, stable.  Bariatric surgery status.  We discussed.  as BMI is low, I hesitate to further increase acarbose.   Patient Instructions  Please continue the  same acarbose.   Please schedule an appointment to see a dietician. Please come back for a follow-up appointment in 3-4 months.

## 2019-09-02 NOTE — Patient Instructions (Addendum)
Please continue the same acarbose.   Please schedule an appointment to see a dietician. Please come back for a follow-up appointment in 3-4 months.

## 2019-09-03 DIAGNOSIS — E43 Unspecified severe protein-calorie malnutrition: Secondary | ICD-10-CM | POA: Diagnosis not present

## 2019-09-03 DIAGNOSIS — E8770 Fluid overload, unspecified: Secondary | ICD-10-CM | POA: Diagnosis not present

## 2019-09-03 DIAGNOSIS — M25472 Effusion, left ankle: Secondary | ICD-10-CM | POA: Diagnosis not present

## 2019-09-03 DIAGNOSIS — R32 Unspecified urinary incontinence: Secondary | ICD-10-CM | POA: Diagnosis not present

## 2019-09-03 DIAGNOSIS — E860 Dehydration: Secondary | ICD-10-CM | POA: Diagnosis not present

## 2019-09-03 DIAGNOSIS — E11649 Type 2 diabetes mellitus with hypoglycemia without coma: Secondary | ICD-10-CM | POA: Diagnosis not present

## 2019-09-03 DIAGNOSIS — E1121 Type 2 diabetes mellitus with diabetic nephropathy: Secondary | ICD-10-CM | POA: Diagnosis not present

## 2019-09-03 DIAGNOSIS — M130 Polyarthritis, unspecified: Secondary | ICD-10-CM | POA: Diagnosis not present

## 2019-09-03 DIAGNOSIS — E162 Hypoglycemia, unspecified: Secondary | ICD-10-CM | POA: Diagnosis not present

## 2019-09-03 DIAGNOSIS — R350 Frequency of micturition: Secondary | ICD-10-CM | POA: Diagnosis not present

## 2019-09-03 DIAGNOSIS — M25522 Pain in left elbow: Secondary | ICD-10-CM | POA: Diagnosis not present

## 2019-09-03 DIAGNOSIS — M25572 Pain in left ankle and joints of left foot: Secondary | ICD-10-CM | POA: Diagnosis not present

## 2019-09-03 DIAGNOSIS — M25571 Pain in right ankle and joints of right foot: Secondary | ICD-10-CM | POA: Diagnosis not present

## 2019-09-03 DIAGNOSIS — R599 Enlarged lymph nodes, unspecified: Secondary | ICD-10-CM | POA: Diagnosis not present

## 2019-09-03 DIAGNOSIS — M25471 Effusion, right ankle: Secondary | ICD-10-CM | POA: Diagnosis not present

## 2019-09-03 DIAGNOSIS — G43001 Migraine without aura, not intractable, with status migrainosus: Secondary | ICD-10-CM | POA: Diagnosis not present

## 2019-09-03 DIAGNOSIS — M62838 Other muscle spasm: Secondary | ICD-10-CM | POA: Diagnosis not present

## 2019-09-08 DIAGNOSIS — M5416 Radiculopathy, lumbar region: Secondary | ICD-10-CM | POA: Diagnosis not present

## 2019-09-08 DIAGNOSIS — G894 Chronic pain syndrome: Secondary | ICD-10-CM | POA: Diagnosis not present

## 2019-09-14 NOTE — Progress Notes (Signed)
NEUROLOGY FOLLOW UP OFFICE NOTE  Kayla [name] [id number]  HISTORY OF PRESENT ILLNESS: Kayla [name] is a 74year old womanwith fibromyalgia, type 2 diabetes, anxiety with history of PTSD, degenerative disc disease of cervical and lumbar spine, and history of nephrolithiasis and gastric bypass who follows up for polyneuropathy and migraine.  UPDATE: Chronic pain: She underwent revision of her spinal cord stimulator last month.Neuropathic pain still an issue.  She would like to try increasing Lyrica back to three times daily again but couldn't tolerate it.  She reports worsening neuropathy - pins and needles and cramps in legs and feet.  She reports sharp non-radiating pain in her elbows (left greater than right).  She also sharp pains radiating down wrists into the thumbs and index fingers (left worse than right).  There is paresthesias radiating up arms and sometimes into chest.  More noticeable at night in bed.  She reports increased swelling in the legs.  It is aggravating her neuropathy in the feet/legs.  She reports pain and swelling/tightness along the left lateral lower leg.    Migraine: Switched from [place] to [place] in March.  Migraines much improved.  1 migraine every 3 months.  Mild headache here and there.   She has had increase in headache frequency.  She has had a migraine every morning for past 6 weeks.   Current NSAIDS:flurbiprofen; Mobic Current analgesics:Excedrin Migraine,Lidoderm, voltaren 1%, Fentanyl patch Current triptans:Sumatriptan 17m Current ergotamine:no Current anti-emetic:no Current muscle relaxants:Flexeril Current anti-anxiolytic:clonazepam Current sleep aide:trazodone Current Antihypertensive medications:Lasix Current Antidepressant medications:Cymbalta927mtwice daily, Lyrica 5058mwice daily(three times daily caused confusion) Current Anticonvulsant medications:No Current anti-CGRP:Emgality Current  Vitamins/Herbal/Supplements:None Current Antihistamines/Decongestants:no Other therapy:Physical therapy, [facility]  Caffeine:2 cups of coffee daily Alcohol:no Smoker:no Diet:poor Exercise:no Depression:no; Anxiety:yes Sleep hygiene:poor  HISTORY: I Neuropathy: Since July 2018, she has had increased swelling and burning numbness and tingling in the legs. She had lower extremity vascular ABIs on 08/20/16, which was negative. She has history of B12 deficiency but she takes injections and recent B12 level from 09/21/16 was over 2000. Methylmalonic acid level was 209, RPR nonreactive, homocysteine 7.8. Labs from 07/12/16 include normal SPEP, Sed Rate 2, CRP less than 0.3, and TSH 2.430 . Vitamin D was 23 and she was advised to start supplementation. Serum glucose has been 70s up to 110. She also takes B6. She also has history of weight loss. She has fibromyalgia and was previously diagnosed with neuropathy by another neurologist. She has been on gabapentin for many years. It was briefly discontinued to see if it was contributing to the swelling. She reported no significant change, so it was restarted (she takes 300m36mtimes daily), although she thinks it helped a little bit. NCV-EMG from 10/21/16 demonstrated chronic sensorimotor polyneuropathy of the predominantly axonal type as well as mild left median neuropathy at or distal to the wrist. Other labs include B1 146.1, B6 10.7, Sed Rate 2, HIV negative, TSH 1.780, UPEP negative. [facility] neuropathy.Selenium, Zinc, paraneoplastic panel and genetic testing Copper was low, so she was advised to start supplement.B6 was still elevated despite stopping supplements 3prior.  Shewaswondering if her neuropathy could be secondary to [employer]. Her husband was a [place] and reports cases of spouses exposed to [employer] their husband's semen. 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 Her neuropathic symptoms started shortly after her return.    II Migraine: Onset: Since 1970, after her twin newborns passed  away. She has lived with life-long family-related stress Location: Migraines are unilateral/parietal or bilateral retro-orbital, chronic tension type (bifrontal) Quality: Severe crushing Initial Intensity: 10/10 Aura: no Prodrome: no Associated symptoms: Photophobia, phonophobia. No nausea, vomiting or visual disturbance Initial Duration: Migraines 1 hour with sumatriptan, other headaches 15 minutes with Fioricet Initial Frequency: Daily (3 to 4 days a month are severe migraine) Triggers/exacerbating factors: Stress, Nexium Relieving factors: Clonazepam, Fioricet, sumatriptan Activity: Cannot function when severe (3-4 days per month)  Past NSAIDS: Ibuprofen, naproxen Past analgesics: Cafergot, Excedrin, Tylenol, Tramadol, Fioricet Past abortive triptans: no Past muscle relaxants:tizanidine Past anti-emetic: Zofran ODT 23m Past antihypertensive medications: Lopressor, propranolol Past antidepressant medications: Elavil, Effexor, Zoloft Past anticonvulsant medications: Topiramate, gabapentin 3029mtwice daily Past CGRP-inhibitor:  Aimovig 14065mast vitamins/Herbal/Supplements:CoQ10 Other past therapies: yoga  Family history of headache: Father, sister, brother  MRI and MRA of brain from 07/23/11 were personally reviewed and unremarkable.  PAST MEDICAL HISTORY: Past Medical History:  Diagnosis Date  . Allergy   . Anal fissure 10/08/2016  . Anemia    anemia in the past  . Angina    takes Propanolol prn and it stops her chest  . Anxiety   . Barrett esophagus    not consistently present  . Cataract   . Chronic kidney disease    kidney stones and UTI  . Complication of anesthesia    either  BP or pulse dropped with last surgery  . DDD (degenerative disc disease)    cervical and  lumbar  . Dementia ([facility] [place] . Depression    post traumatic stress  disorder  . Diabetes ([facility]free1/05/2016  . Diabetes mellitus    sugar goes up and down diet controlled  . Dysrhythmia    brought on by stress  . External hemorrhoids   . Fatigue   . Fibromyalgia    neuropathy knees ankles and toes, bladder  . GERD (gastroesophageal reflux disease)   . H/O hyperparathyroidism   . Headache(784.0)    migraines  . Hiatal hernia   . History of kidney stones   . Hypertension    3 small brain aneurysms  . Hypoglycemia 11/08/2016  . IBS (irritable bowel syndrome)   . Internal hemorrhoids   . Macular degeneration   . Migraines   . Mitral regurgitation 12/11/2015  . Osteoarthritis    all over  . Osteopetrosis   . Peripheral vascular disease (HCC)    superficial phlebitis in left calf  . Personal history of colonic polyps 07/22/2013   07/2013 - 3 small adenomas - repeat colonoscopy 2018  . Small intestinal bacterial overgrowth 08/05/2017   + lactulose breath test 06/2017 Xifaxan Rx    MEDICATIONS: Current Outpatient Medications on File Prior to Visit  Medication Sig Dispense Refill  . acarbose (PRECOSE) 50 MG tablet Take 1 tablet (50 mg total) by mouth 3 (three) times daily with meals. 270 tablet 3  . aspirin-acetaminophen-caffeine (EXCEDRIN MIGRAINE) 250-250-65 MG tablet Take 2 tablets by mouth 2 (two) times daily.    . Blood Glucose Monitoring Suppl (FREESTYLE FREEDOM LITE) w/Device KIT 1 each by Does not apply route 4 (four) times daily. Use to monitor glucose levels 4 times daily; E11.9 1 kit 0  . clonazePAM (KLONOPIN) 1 MG tablet Take 0.5-1 tablets (0.5-1 mg total) by mouth 2 (two) times daily as needed for anxiety. 180 tablet 0  . Continuous Blood Gluc Receiver (FREESTYLE LIBRE 14 DAY READER) DEVI 1 each by Does not apply route See admin instructions.  Use with sensors to monitor glucose levels; E11.9 1 each 0  . Continuous Blood Gluc Sensor (FREESTYLE LIBRE 14 DAY SENSOR) MISC 1 Device  by Does not apply route every 14 (fourteen) days. 6 each 3  . cyclobenzaprine (FLEXERIL) 10 MG tablet Take 1 tablet (10 mg total) by mouth 3 (three) times daily as needed for muscle spasms. 90 day supply 270 tablet 3  . cycloSPORINE (RESTASIS) 0.05 % ophthalmic emulsion Place 1 drop into both eyes 2 (two) times daily. 5.5 mL 5  . diclofenac sodium (VOLTAREN) 1 % GEL Apply 2 g topically 4 (four) times daily. 700 g 3  . dicyclomine (BENTYL) 10 MG capsule Take 1-2 tab 3 times daily AC as needed for spasms and cramping. 270 capsule 1  . diltiazem 2 % GEL Apply 1 application topically 2 (two) times daily as needed (pain from fissure). 30 g 2  . DULoxetine (CYMBALTA) 30 MG capsule Take 3 capsules (90 mg total) by mouth daily. 90 capsule 5  . Erenumab-aooe (AIMOVIG) 140 MG/ML SOAJ Inject 140 mg into the skin every 30 (thirty) days. 90 day supply 3 pen 3  . esomeprazole (NEXIUM) 40 MG capsule Take 1 capsule (40 mg total) by mouth daily before breakfast. 90 capsule 3  . fentaNYL (DURAGESIC - DOSED MCG/HR) 50 MCG/HR Place 50 mcg onto the skin every 3 (three) days.    . flurbiprofen (ANSAID) 100 MG tablet Take 1 tablet (100 mg total) by mouth every 8 (eight) hours as needed (maximum 3 tablets in 24 hours). 24 tablet 3  . Galcanezumab-gnlm (EMGALITY) 120 MG/ML SOAJ Inject 120 mg into the skin every 28 (twenty-eight) days. 3 pen 3  . glucagon 1 MG injection Inject 1 mg into the muscle once as needed for up to 1 dose. 1 each 12  . glucose blood (FREESTYLE TEST STRIPS) test strip Use to monitor glucose levels 4 times daily; E11.9 200 each 0  . HYDROcodone-acetaminophen (NORCO) 7.5-325 MG tablet Take 1 tablet every 6 (six) hours as needed by mouth for moderate pain.    . hypromellose (SYSTANE OVERNIGHT THERAPY) 0.3 % GEL ophthalmic ointment Place 1 drop into both eyes at bedtime. Reported on 02/11/2015    . Lancets (FREESTYLE) lancets Use to monitor glucose levels 4 times daily; E11.9 200 each 0  . lidocaine  (LIDODERM) 5 % Place 1 patch onto the skin as needed. Remove & Discard patch within 12 hours. May dispense as 3 month supply 90 patch 3  . Lifitegrast (XIIDRA) 5 % SOLN Place 1 drop into both eyes daily.     . meloxicam (MOBIC) 15 MG tablet Take 1 tablet (15 mg total) by mouth daily. Prn pain 90 tablet 1  . metroNIDAZOLE (METROGEL) 1 % gel Apply topically daily. 45 g 0  . mirabegron ER (MYRBETRIQ) 25 MG TB24 tablet Take 1 tablet (25 mg total) by mouth daily. 90 tablet 1  . Multiple Vitamins-Minerals (PRESERVISION AREDS) CAPS Take 1 capsule by mouth 2 (two) times daily.    . naloxegol oxalate (MOVANTIK) 25 MG TABS tablet Take 25 mg by mouth daily.    . Naloxone HCl 0.4 MG/0.4ML SOAJ Administer 0.20m [place] at first sign of opioid overdose and repeat every 2 minutes as needed for resuscitation. Call 911 immediately 5 Package 0  . NON FORMULARY [facility]  Peripheral Neuropathy Cream- Bupivacaine 1%, Doxepin 3%, Gabapentin 6%, Pentoxifylline 3%, Topiramate 1% Apply 1-2 grams to affected area 3-4 times daily Qty. 120 gm 3 refills    .  ondansetron (ZOFRAN) 8 MG tablet Take 1 tablet (8 mg total) by mouth every 8 (eight) hours as needed for nausea or vomiting. 60 tablet 0  . pregabalin (LYRICA) 50 MG capsule Take 1 capsule (50 mg total) by mouth 3 (three) times daily. 90 day supply. 270 capsule 3  . Probiotic Product (PROBIOTIC-10 PO) Take by mouth.     . sucralfate (CARAFATE) 1 GM/10ML suspension Take 10 mLs (1 g total) by mouth 4 (four) times daily. 420 mL 1  . SUMAtriptan (IMITREX) 100 MG tablet Take 1 tablet at earliest onset of headache, may repeat in 2 hours if headache persists or reoccurs. 30 tablet 1  . tizanidine (ZANAFLEX) 2 MG capsule TAKE 1 CAPSULE (2 MG TOTAL) BY MOUTH 3 (THREE) TIMES DAILY.    . traZODone (DESYREL) 100 MG tablet Take 0.5-1 tablets (50-100 mg total) by mouth at bedtime as needed for sleep. And 1/2 -1 if wakes at 4am. 90 tablet 1  . valACYclovir (VALTREX) 1000 MG tablet  Take 1 tablet (1,000 mg total) by mouth 3 (three) times daily. 21 tablet 0  . Vitamin D, Ergocalciferol, (DRISDOL) 1.25 MG (50000 UT) CAPS capsule Take 1 capsule (50,000 Units total) by mouth every 7 (seven) days. 12 capsule 3   No current facility-administered medications on file prior to visit.    ALLERGIES: Allergies  Allergen Reactions  . Ambien [Zolpidem Tartrate]     Hallucinations  . Codeine Anaphylaxis  . Oxycodone-Acetaminophen Shortness Of Breath  . Tylox [Oxycodone-Acetaminophen] Anaphylaxis    Chest pain  . Darvocet [Propoxyphene N-Acetaminophen]     Chest pain  . Darvon [Propoxyphene] Itching  . Lorcet [Hydrocodone-Acetaminophen]     Chest pain  . Opium     hallucinations  . Vicodin [Hydrocodone-Acetaminophen] Other (See Comments)    Chest Pain   . Wheat Bran   . Amoxicillin Nausea And Vomiting    Reaction:  Migraine headache  . Penicillins Nausea And Vomiting    Migraine Headaches    FAMILY HISTORY: Family History  Problem Relation Age of Onset  . Stroke Mother   . Lung cancer Mother   . Heart disease Mother   . Diabetes Mother   . Hypertension Father   . Stroke Father   . Lung cancer Father   . Heart disease Father   . Kidney disease Father   . Seizures Father        epilepsy, and sisters x 2  . Pancreatic cancer Sister   . Lung cancer Sister   . Colon cancer Maternal Aunt        12 relatives  . Uterine cancer Other        aunt  . Heart disease Other        grandmother  . Clotting disorder Sister   . Heart disease Sister        x 2  . Kidney disease Sister        x 2  . Breast cancer Sister   . Colon cancer Paternal Aunt   . Colon cancer Paternal Aunt   . Esophageal cancer Neg Hx   . Rectal cancer Neg Hx   . Stomach cancer Neg Hx    SOCIAL HISTORY: Social History   Socioeconomic History  . Marital status: Married    Spouse name: Rushie Chestnut.  . Number of children: 2  . Years of education: Not on file  . Highest education level: Not  on file  Occupational History  . Occupation: Disability  Employer: RETIRED  Tobacco Use  . Smoking status: Never Smoker  . Smokeless tobacco: Never Used  Vaping Use  . Vaping Use: Never used  Substance and Sexual Activity  . Alcohol use: No  . Drug use: No  . Sexual activity: Not Currently  Other Topics Concern  . Not on file  Social History Narrative   Right handed   Two story home   She retired on disability   Married   2 Children   Social Determinants of Health   Financial Resource Strain:   . Difficulty of Paying Living Expenses: Not on file  Food Insecurity:   . Worried About Charity fundraiser in the Last Year: Not on file  . Ran Out of Food in the Last Year: Not on file  Transportation Needs:   . Lack of Transportation (Medical): Not on file  . Lack of Transportation (Non-Medical): Not on file  Physical Activity:   . Days of Exercise per Week: Not on file  . Minutes of Exercise per Session: Not on file  Stress:   . Feeling of Stress : Not on file  Social Connections:   . Frequency of Communication with Friends and Family: Not on file  . Frequency of Social Gatherings with Friends and Family: Not on file  . Attends Religious Services: Not on file  . Active Member of Clubs or Organizations: Not on file  . Attends Archivist Meetings: Not on file  . Marital Status: Not on file  Intimate Partner Violence:   . Fear of Current or Ex-Partner: Not on file  . Emotionally Abused: Not on file  . Physically Abused: Not on file  . Sexually Abused: Not on file    PHYSICAL EXAM: Blood pressure 119/71, pulse 91, height _0  (1.651 m), weight 106 lb (48.1 kg), SpO2 98 %. General: No acute distress.  Patient appears well-groomed.   Head:  Normocephalic/atraumatic Eyes:  Fundi examined but not visualized Neck: supple, no paraspinal tenderness, full range of motion Heart:  Regular rate and rhythm Lungs:  Clear to auscultation bilaterally Back: No paraspinal  tenderness Neurological Exam: alert and oriented to person, place, and time. Attention span and concentration intact, recent and remote memory intact, fund of knowledge intact.  Speech fluent and not dysarthric, language intact.  CN II-XII intact. Bulk and tone normal, muscle strength 5-/5 left EHL, otherwise 5/5 throughout.  Sensation to pinprick sensation reduced up to ankles; sensation to vibration reduced up to below knees.  Deep tendon reflexes absent throughout, toes downgoing.  Finger to nose intact.  Cautious wide-based gait.  Romberg positive.  IMPRESSION: 1.  Migraine without aura, without status migrainosus, not intractable 2.  Idiopathic polyneuropathy 3.  Chronic pain syndrome 4.  Possible bilateral carpal tunnel syndrome  PLAN: 1.  Migraine prevention:  Emgality 2.  Migraine rescue:  Sumatriptan 3.  Neuropathic pain:  Lyrica 51m BID (also prescribed Cymbalta by other provider) 4.  Advised to wear bilateral wrist splints. 5.  Follow up in 6 months.  AMetta Clines DO  CC: EDelman Cheadle MD

## 2019-09-16 ENCOUNTER — Encounter: Payer: Self-pay | Admitting: Neurology

## 2019-09-16 ENCOUNTER — Other Ambulatory Visit: Payer: Self-pay

## 2019-09-16 ENCOUNTER — Ambulatory Visit (INDEPENDENT_AMBULATORY_CARE_PROVIDER_SITE_OTHER): Payer: Medicare Other | Admitting: Neurology

## 2019-09-16 VITALS — BP 119/71 | HR 91 | Ht 65.0 in | Wt 106.0 lb

## 2019-09-16 DIAGNOSIS — G5603 Carpal tunnel syndrome, bilateral upper limbs: Secondary | ICD-10-CM

## 2019-09-16 DIAGNOSIS — G894 Chronic pain syndrome: Secondary | ICD-10-CM

## 2019-09-16 DIAGNOSIS — G43009 Migraine without aura, not intractable, without status migrainosus: Secondary | ICD-10-CM | POA: Diagnosis not present

## 2019-09-16 DIAGNOSIS — G609 Hereditary and idiopathic neuropathy, unspecified: Secondary | ICD-10-CM | POA: Diagnosis not present

## 2019-09-16 DIAGNOSIS — M47812 Spondylosis without myelopathy or radiculopathy, cervical region: Secondary | ICD-10-CM | POA: Diagnosis not present

## 2019-09-16 NOTE — Patient Instructions (Signed)
NO change in management at this time Continue Emgality

## 2019-09-21 ENCOUNTER — Other Ambulatory Visit: Payer: Self-pay

## 2019-09-21 ENCOUNTER — Encounter: Payer: Self-pay | Admitting: Cardiovascular Disease

## 2019-09-21 ENCOUNTER — Ambulatory Visit (INDEPENDENT_AMBULATORY_CARE_PROVIDER_SITE_OTHER): Payer: Medicare Other | Admitting: Cardiovascular Disease

## 2019-09-21 VITALS — BP 108/68 | HR 99 | Ht 65.0 in | Wt 103.2 lb

## 2019-09-21 DIAGNOSIS — R002 Palpitations: Secondary | ICD-10-CM

## 2019-09-21 DIAGNOSIS — I951 Orthostatic hypotension: Secondary | ICD-10-CM

## 2019-09-21 DIAGNOSIS — I341 Nonrheumatic mitral (valve) prolapse: Secondary | ICD-10-CM

## 2019-09-21 DIAGNOSIS — M7989 Other specified soft tissue disorders: Secondary | ICD-10-CM | POA: Diagnosis not present

## 2019-09-21 NOTE — Patient Instructions (Signed)

## 2019-09-21 NOTE — Progress Notes (Signed)
Kayla [name] Date of Birth  May 16, 1945       [facility] Office [address], [address], [place], [place]  [address]   [place], [place]  [address] [phone]     [phone]   Fax  [phone]    Fax [phone]  Problem List: 1. Mitral valve prolapse-mitral regurgitation 2. Hypertension 3. Barrett's esophagus 4. Fibromyalgia 5. History chest pain-she had a normal heart catheterization in 1993. Stress Myoview study in 2008 was normal. 6. Brain aneurism -  7. S/p Roux-en-Y surgery  for Barretts esophagus  Previous notes  Kayla [name] continues to have chest pain.  She takes PRN propranolol which seems to help.  It typically occurs in the afternoon.  Almost always with rest.  She does not get any regular exercise.  She has had lots of bladder infections due to kidney stones recently that have not permitted her to exercise.  She's had lots of problems with anxiety.  July 15, 2014:  Kayla [name] presents for evaluation Her BP has been very low - possibly due to the various medications .  She has had left sided chest pain for years.  Has had a normal cath in the remote past and had a normal myoview  Several years ago .   She was recently seen by Dr. Newman Pies for some palpitations The palpitations would come and go .  Off and on for hours.  Was started on atenolol which has helped the palpitations. She had taken atenolol for years ( many years ago) but was off atenolol for several years.  Her palpitations have resolved after starting the atenolol .    These episodes of tachycardia are at times associated with episodes of CP . Has lost a lot of weight over the past several years. She had a Roux -in - Y gastric bypass ( supposedly to prevent esophageal cancer from Barretts esophagutis. Is not able to eat much.  Can eat 1/4 cup of food at a time .    Sept. 19, 2016:  Continues to have palpitations  - improve with  propranolol Continues to have poor appetite.  Is generally very weak Continues to see her primary medical doctor and her pain management doctor .   Nov. 27,2017:  Seems to be doing well from a cardiac standpoint. Having urology issues.  May need a bladder tack. Still has   Nov. 7, 2018 Has been diagnosed with DM and has diabetic neuropathy.   Lots of pain in her feet.   Still has some CP Still has some palpittations  .   Has lost 40 lbs over the past 10 years.  Admitted that she is not eating well.   July 02, 2017: Follow up for MVP and noncardiac CP  Hx of HTN but has been low for the past several years.   Doing well. Now has a Freestyle Libra glucose monitor  Doing well with that  Continues to lose weight Wt is 110 lbs. Down 6 lbs from last Nov.  Not eating well.   Needs to eat more protein   December 17, 2017: Kayla [name] is seen today for follow-up visit.  She is had problems with orthostatic hypotension.  We felt that she may have some degree of dysautonomia.  It is clear that her diet is not very good. Her weight today is 101 pounds.  This is down 9 pounds from her previous visit.  Her symptoms of orthostasis  have improved Develops palpitations after eating   May 05, 2019:  Kayla [name] is seen today for follow up of her MVP and orthostatic hypotension .  Wt is 108 lbs today ( up 7 lbs from last visit ) , has some leg swelling  She took lasix yesterday  Advised her to elevated her legs and consider compression hose.  She saw her primary care yesterday  Had labs drawn yesterday  Has some mild chest pain on occasion.     Sept. 7, 2021:  Kayla [name] is seen today for follow up of her MVP and orthostatic hypotension. She has had some leg edema - we discussed leg elevation and compression hose at her last visit She has continued to have ankle  swelling  Better with the lasix and potassium  Seems to accumulate through the day  Has hx of superficial phlebitis  Still limited  by peripheral neuropathy.  Wt is 103 lbs ( down 5 lbs from last visit )    Current Outpatient Medications on File Prior to Visit  Medication Sig Dispense Refill   acarbose (PRECOSE) 50 MG tablet Take 1 tablet (50 mg total) by mouth 3 (three) times daily with meals. 270 tablet 3   aspirin-acetaminophen-caffeine (EXCEDRIN MIGRAINE) 250-250-65 MG tablet Take 2 tablets by mouth 2 (two) times daily.     Blood Glucose Monitoring Suppl (FREESTYLE FREEDOM LITE) w/Device KIT 1 each by Does not apply route 4 (four) times daily. Use to monitor glucose levels 4 times daily; E11.9 1 kit 0   clonazePAM (KLONOPIN) 1 MG tablet Take 0.5-1 tablets (0.5-1 mg total) by mouth 2 (two) times daily as needed for anxiety. 180 tablet 0   Continuous Blood Gluc Receiver (FREESTYLE LIBRE 14 DAY READER) DEVI 1 each by Does not apply route See admin instructions. Use with sensors to monitor glucose levels; E11.9 1 each 0   Continuous Blood Gluc Sensor (FREESTYLE LIBRE 14 DAY SENSOR) MISC 1 Device by Does not apply route every 14 (fourteen) days. 6 each 3   cyclobenzaprine (FLEXERIL) 10 MG tablet Take 1 tablet (10 mg total) by mouth 3 (three) times daily as needed for muscle spasms. 90 day supply 270 tablet 3   cycloSPORINE (RESTASIS) 0.05 % ophthalmic emulsion Place 1 drop into both eyes 2 (two) times daily. 5.5 mL 5   diclofenac sodium (VOLTAREN) 1 % GEL Apply 2 g topically 4 (four) times daily. 700 g 3   dicyclomine (BENTYL) 10 MG capsule Take 1-2 tab 3 times daily AC as needed for spasms and cramping. 270 capsule 1   diltiazem 2 % GEL Apply 1 application topically 2 (two) times daily as needed (pain from fissure). 30 g 2   DULoxetine (CYMBALTA) 30 MG capsule Take 3 capsules (90 mg total) by mouth daily. 90 capsule 5   Erenumab-aooe (AIMOVIG) 140 MG/ML SOAJ Inject 140 mg into the skin every 30 (thirty) days. 90 day supply (Patient not taking: Reported on 09/16/2019) 3 pen 3   esomeprazole (NEXIUM) 40 MG capsule  Take 1 capsule (40 mg total) by mouth daily before breakfast. 90 capsule 3   fentaNYL (DURAGESIC - DOSED MCG/HR) 50 MCG/HR Place 50 mcg onto the skin every 3 (three) days.     flurbiprofen (ANSAID) 100 MG tablet Take 1 tablet (100 mg total) by mouth every 8 (eight) hours as needed (maximum 3 tablets in 24 hours). 24 tablet 3   Galcanezumab-gnlm (EMGALITY) 120 MG/ML SOAJ Inject 120 mg into the skin every 28 (twenty-eight) days. 3  pen 3   glucagon 1 MG injection Inject 1 mg into the muscle once as needed for up to 1 dose. 1 each 12   glucose blood (FREESTYLE TEST STRIPS) test strip Use to monitor glucose levels 4 times daily; E11.9 200 each 0   HYDROcodone-acetaminophen (NORCO) 7.5-325 MG tablet Take 1 tablet every 6 (six) hours as needed by mouth for moderate pain.     hypromellose (SYSTANE OVERNIGHT THERAPY) 0.3 % GEL ophthalmic ointment Place 1 drop into both eyes at bedtime. Reported on 02/11/2015     Lancets (FREESTYLE) lancets Use to monitor glucose levels 4 times daily; E11.9 200 each 0   lidocaine (LIDODERM) 5 % Place 1 patch onto the skin as needed. Remove & Discard patch within 12 hours. May dispense as 3 month supply 90 patch 3   Lifitegrast (XIIDRA) 5 % SOLN Place 1 drop into both eyes daily.      meloxicam (MOBIC) 15 MG tablet Take 1 tablet (15 mg total) by mouth daily. Prn pain 90 tablet 1   metroNIDAZOLE (METROGEL) 1 % gel Apply topically daily. 45 g 0   mirabegron ER (MYRBETRIQ) 25 MG TB24 tablet Take 1 tablet (25 mg total) by mouth daily. (Patient not taking: Reported on 09/16/2019) 90 tablet 1   Multiple Vitamins-Minerals (PRESERVISION AREDS) CAPS Take 1 capsule by mouth 2 (two) times daily. (Patient not taking: Reported on 09/16/2019)     naloxegol oxalate (MOVANTIK) 25 MG TABS tablet Take 25 mg by mouth daily.     Naloxone HCl 0.4 MG/0.4ML SOAJ Administer 0.$RemoveBeforeDEI'4mg'[id number]$  [place] at first sign of opioid overdose and repeat every 2 minutes as needed for resuscitation. Call 911 immediately  5 Package [place]  Peripheral Neuropathy Cream- Bupivacaine 1%, Doxepin 3%, Gabapentin 6%, Pentoxifylline 3%, Topiramate 1% Apply 1-2 grams to affected area 3-4 times daily Qty. 120 gm 3 refills     ondansetron (ZOFRAN) 8 MG tablet Take 1 tablet (8 mg total) by mouth every 8 (eight) hours as needed for nausea or vomiting. 60 tablet 0   pregabalin (LYRICA) 50 MG capsule Take 1 capsule (50 mg total) by mouth 3 (three) times daily. 90 day supply. 270 capsule 3   Probiotic Product (PROBIOTIC-10 PO) Take by mouth.      sucralfate (CARAFATE) 1 GM/10ML suspension Take 10 mLs (1 g total) by mouth 4 (four) times daily. 420 mL 1   SUMAtriptan (IMITREX) 100 MG tablet Take 1 tablet at earliest onset of headache, may repeat in 2 hours if headache persists or reoccurs. 30 tablet 1   tizanidine (ZANAFLEX) 2 MG capsule TAKE 1 CAPSULE (2 MG TOTAL) BY MOUTH 3 (THREE) TIMES DAILY.     traZODone (DESYREL) 100 MG tablet Take 0.5-1 tablets (50-100 mg total) by mouth at bedtime as needed for sleep. And 1/2 -1 if wakes at 4am. (Patient not taking: Reported on 09/16/2019) 90 tablet 1   valACYclovir (VALTREX) 1000 MG tablet Take 1 tablet (1,000 mg total) by mouth 3 (three) times daily. 21 tablet 0   Vitamin D, Ergocalciferol, (DRISDOL) 1.25 MG (50000 UT) CAPS capsule Take 1 capsule (50,000 Units total) by mouth every 7 (seven) days. 12 capsule 3   No current facility-administered medications on file prior to visit.    Allergies  Allergen Reactions   Ambien [Zolpidem Tartrate]     Hallucinations   Codeine Anaphylaxis   Oxycodone-Acetaminophen Shortness Of Breath   Tylox [Oxycodone-Acetaminophen] Anaphylaxis    Chest pain   Darvocet [  Propoxyphene N-Acetaminophen]     Chest pain   Darvon [Propoxyphene] Itching   Lorcet [Hydrocodone-Acetaminophen]     Chest pain   Opium     hallucinations   Vicodin [Hydrocodone-Acetaminophen] Other (See Comments)    Chest Pain     Wheat Bran    Amoxicillin Nausea And Vomiting    Reaction:  Migraine headache   Penicillins Nausea And Vomiting    Migraine Headaches    Past Medical History:  Diagnosis Date   Allergy    Anal fissure 10/08/2016   Anemia    anemia in the past   Angina    takes Propanolol prn and it stops her chest   Anxiety    Barrett esophagus    not consistently present   Cataract    Chronic kidney disease    kidney stones and UTI   Complication of anesthesia    either  BP or pulse dropped with last surgery   DDD (degenerative disc disease)    cervical and lumbar   Dementia ([place])    Depression    post traumatic stress  disorder   Diabetes ([place]) 11/18/2016   Diabetes mellitus    sugar goes up and down diet controlled   Dysrhythmia    brought on by stress   External hemorrhoids    Fatigue    Fibromyalgia    neuropathy knees ankles and toes, bladder   GERD (gastroesophageal reflux disease)    H/O hyperparathyroidism    Headache(784.0)    migraines   Hiatal hernia    History of kidney stones    Hypertension    3 small brain aneurysms   Hypoglycemia 11/08/2016   IBS (irritable bowel syndrome)    Internal hemorrhoids    Macular degeneration    Migraines    Mitral regurgitation 12/11/2015   Osteoarthritis    all over   Osteopetrosis    Peripheral vascular disease ([place])    superficial phlebitis in left calf   Personal history of colonic polyps 07/22/2013   07/2013 - 3 small adenomas - repeat colonoscopy 2018   Small intestinal bacterial overgrowth 08/05/2017   + lactulose breath test 06/2017 Xifaxan Rx    Past Surgical History:  Procedure Laterality Date   ABDOMINAL HYSTERECTOMY     APPENDECTOMY     CARDIAC CATHETERIZATION  03/22/1991   EF 61%   CARDIOVASCULAR STRESS TEST  10/03/2006   CHOLECYSTECTOMY     COLONOSCOPY  06/23/2008   normal   DILATION AND CURETTAGE OF UTERUS     x2   ESOPHAGUS SURGERY     EXPLORATORY LAPAROTOMY  1993    EYE SURGERY     GASTRIC BYPASS  1999   GASTRIC RESTRICTION SURGERY  1991   LASIK Bilateral    Right Arm Surgery     Right Knee Arthroscopy     Rt wrist fx  2009   spinal surgery battery implant     stomach stappeling  1991   STONE EXTRACTION WITH BASKET  2012   TONSILLECTOMY     TUBAL LIGATION     UPPER GASTROINTESTINAL ENDOSCOPY  06/23/2008   w/Dil, Barrett's esophagus   US ECHOCARDIOGRAPHY  01/21/2007   EF 55-60%   US ECHOCARDIOGRAPHY  08/30/2003   EF 55-60%    Social History   Tobacco Use  Smoking Status Never Smoker  Smokeless Tobacco Never Used    Social History   Substance and Sexual Activity  Alcohol Use No    Family History  Problem  Relation Age of Onset   Stroke Mother    Lung cancer Mother    Heart disease Mother    Diabetes Mother    Hypertension Father    Stroke Father    Lung cancer Father    Heart disease Father    Kidney disease Father    Seizures Father        epilepsy, and sisters x 2   Pancreatic cancer Sister    Lung cancer Sister    Colon cancer Maternal Aunt        12 relatives   Uterine cancer Other        aunt   Heart disease Other        grandmother   Clotting disorder Sister    Heart disease Sister        x 2   Kidney disease Sister        x 2   Breast cancer Sister    Colon cancer Paternal Aunt    Colon cancer Paternal Aunt    Esophageal cancer Neg Hx    Rectal cancer Neg Hx    Stomach cancer Neg Hx     Reviw of Systems:  Reviewed in the HPI.  All other systems are negative.   Physical Exam: There were no vitals taken for this visit.  GEN:  Well nourished, well developed in no acute distress HEENT: Normal NECK: No JVD; No carotid bruits LYMPHATICS: No lymphadenopathy CARDIAC: RRR , no murmurs, rubs, gallops RESPIRATORY:  Clear to auscultation without rales, wheezing or rhonchi  ABDOMEN: Soft, non-tender, non-distended MUSCULOSKELETAL:  No edema; No deformity  SKIN: Warm  and dry NEUROLOGIC:  Alert and oriented x 3   ECG: September 21, 2019: Normal sinus rhythm at 99.  No ST or T wave changes.    Assessment / Plan:   1.  Orthostatic hypotension: Continues to have episodes of orthostasis.  I suspect that she is not eating and drinking enough.    2.  Chest pain:     She has intermittent episodes of atypical chest pain.  2.  Leg swelling :   . She does not have any leg edema today but does have intermittent swelling.  I discussed having her use leg elevation and compression hose.  I would like to avoid any diuretics since she already has orthostatic hypotension.  We will see her again in 1 year.  Mertie Moores, MD  09/21/2019 6:08 AM    [address] [place],  [place], [place]  [address] Pager [phone] Phone: [phone]; Fax: [phone]

## 2019-09-23 DIAGNOSIS — N3946 Mixed incontinence: Secondary | ICD-10-CM | POA: Diagnosis not present

## 2019-09-23 DIAGNOSIS — R351 Nocturia: Secondary | ICD-10-CM | POA: Diagnosis not present

## 2019-09-23 DIAGNOSIS — N3020 Other chronic cystitis without hematuria: Secondary | ICD-10-CM | POA: Diagnosis not present

## 2019-09-24 DIAGNOSIS — M25571 Pain in right ankle and joints of right foot: Secondary | ICD-10-CM | POA: Diagnosis not present

## 2019-09-24 DIAGNOSIS — G43001 Migraine without aura, not intractable, with status migrainosus: Secondary | ICD-10-CM | POA: Diagnosis not present

## 2019-09-24 DIAGNOSIS — M25471 Effusion, right ankle: Secondary | ICD-10-CM | POA: Diagnosis not present

## 2019-09-24 DIAGNOSIS — K588 Other irritable bowel syndrome: Secondary | ICD-10-CM | POA: Diagnosis not present

## 2019-09-24 DIAGNOSIS — Q438 Other specified congenital malformations of intestine: Secondary | ICD-10-CM | POA: Diagnosis not present

## 2019-09-24 DIAGNOSIS — M722 Plantar fascial fibromatosis: Secondary | ICD-10-CM | POA: Diagnosis not present

## 2019-09-24 DIAGNOSIS — M25572 Pain in left ankle and joints of left foot: Secondary | ICD-10-CM | POA: Diagnosis not present

## 2019-09-24 DIAGNOSIS — R32 Unspecified urinary incontinence: Secondary | ICD-10-CM | POA: Diagnosis not present

## 2019-09-24 DIAGNOSIS — K22719 Barrett's esophagus with dysplasia, unspecified: Secondary | ICD-10-CM | POA: Diagnosis not present

## 2019-09-24 DIAGNOSIS — M25472 Effusion, left ankle: Secondary | ICD-10-CM | POA: Diagnosis not present

## 2019-09-24 DIAGNOSIS — E43 Unspecified severe protein-calorie malnutrition: Secondary | ICD-10-CM | POA: Diagnosis not present

## 2019-09-24 DIAGNOSIS — M25522 Pain in left elbow: Secondary | ICD-10-CM | POA: Diagnosis not present

## 2019-10-01 ENCOUNTER — Ambulatory Visit
Admission: RE | Admit: 2019-10-01 | Discharge: 2019-10-01 | Disposition: A | Discharge status: Home / Self Care | Payer: Medicare Other | Source: Ambulatory Visit | Attending: Family Medicine | Admitting: Family Medicine

## 2019-10-01 ENCOUNTER — Other Ambulatory Visit: Payer: Self-pay

## 2019-10-01 DIAGNOSIS — Z1231 Encounter for screening mammogram for malignant neoplasm of breast: Secondary | ICD-10-CM | POA: Diagnosis not present

## 2019-10-06 DIAGNOSIS — Z8249 Family history of ischemic heart disease and other diseases of the circulatory system: Secondary | ICD-10-CM | POA: Diagnosis not present

## 2019-10-06 DIAGNOSIS — Z1379 Encounter for other screening for genetic and chromosomal anomalies: Secondary | ICD-10-CM | POA: Diagnosis not present

## 2019-10-06 DIAGNOSIS — I341 Nonrheumatic mitral (valve) prolapse: Secondary | ICD-10-CM | POA: Diagnosis not present

## 2019-10-06 DIAGNOSIS — R002 Palpitations: Secondary | ICD-10-CM | POA: Diagnosis not present

## 2019-10-06 DIAGNOSIS — R011 Cardiac murmur, unspecified: Secondary | ICD-10-CM | POA: Diagnosis not present

## 2019-10-06 DIAGNOSIS — I499 Cardiac arrhythmia, unspecified: Secondary | ICD-10-CM | POA: Diagnosis not present

## 2019-10-12 ENCOUNTER — Ambulatory Visit: Payer: Medicare Other | Admitting: Physician Assistant

## 2019-10-19 DIAGNOSIS — H04123 Dry eye syndrome of bilateral lacrimal glands: Secondary | ICD-10-CM | POA: Diagnosis not present

## 2019-10-19 DIAGNOSIS — H16223 Keratoconjunctivitis sicca, not specified as Sjogren's, bilateral: Secondary | ICD-10-CM | POA: Diagnosis not present

## 2019-10-19 DIAGNOSIS — H40023 Open angle with borderline findings, high risk, bilateral: Secondary | ICD-10-CM | POA: Diagnosis not present

## 2019-10-20 ENCOUNTER — Other Ambulatory Visit: Payer: Self-pay

## 2019-10-20 DIAGNOSIS — I739 Peripheral vascular disease, unspecified: Secondary | ICD-10-CM

## 2019-11-10 ENCOUNTER — Encounter: Payer: Self-pay | Admitting: Gastroenterology

## 2019-11-10 ENCOUNTER — Ambulatory Visit (INDEPENDENT_AMBULATORY_CARE_PROVIDER_SITE_OTHER): Payer: Medicare Other | Admitting: Gastroenterology

## 2019-11-10 ENCOUNTER — Other Ambulatory Visit (INDEPENDENT_AMBULATORY_CARE_PROVIDER_SITE_OTHER): Payer: Medicare Other

## 2019-11-10 VITALS — BP 104/58 | HR 92 | Ht 65.0 in | Wt 105.4 lb

## 2019-11-10 DIAGNOSIS — R1013 Epigastric pain: Secondary | ICD-10-CM | POA: Diagnosis not present

## 2019-11-10 DIAGNOSIS — F5000 Anorexia nervosa, unspecified: Secondary | ICD-10-CM

## 2019-11-10 DIAGNOSIS — R1032 Left lower quadrant pain: Secondary | ICD-10-CM | POA: Diagnosis not present

## 2019-11-10 DIAGNOSIS — E46 Unspecified protein-calorie malnutrition: Secondary | ICD-10-CM

## 2019-11-10 LAB — BASIC METABOLIC PANEL
BUN: 22 mg/dL (ref 6–23)
CO2: 31 mEq/L (ref 19–32)
Calcium: 8.9 mg/dL (ref 8.4–10.5)
Chloride: 105 mEq/L (ref 96–112)
Creatinine, Ser: 0.68 mg/dL (ref 0.40–1.20)
GFR: 87.14 mL/min (ref >60.00)
Glucose, Bld: 87 mg/dL (ref 70–99)
Potassium: 4.7 mEq/L (ref 3.5–5.1)
Sodium: 139 mEq/L (ref 135–145)

## 2019-11-10 MED ORDER — ESOMEPRAZOLE MAGNESIUM 40 MG PO CPDR: 40.0000 mg | DELAYED_RELEASE_CAPSULE | Freq: Every day | ORAL | 3 refills | Status: DC

## 2019-11-10 MED ORDER — SUCRALFATE 1 GM/10ML PO SUSP: 1.0000 g | Freq: Four times a day (QID) | ORAL | 1 refills | Status: AC

## 2019-11-10 MED ORDER — DICYCLOMINE HCL 10 MG PO CAPS: ORAL_CAPSULE | ORAL | 1 refills | Status: AC

## 2019-11-10 NOTE — Progress Notes (Signed)
11/10/2019 Kayla [name] [id number] Apr 27, 1945   HISTORY OF PRESENT ILLNESS: This is a 74 year old female who is a patient of Dr. Celesta Aver.  She is known to him for several years.  She was last seen by him in September 2020.  She follows here for issues with GERD and dysphagia as well as chronic constipation.  She is here today stating that she needs to follow-up on some old issues.  She says that she has been unable to eat as she gets nauseated after just 3 bites.  She says that food seems to get stuck and she has to throw up.  She just had an EGD in October 2020 that showed no cause of her dysphagia, but the esophagus was empirically dilated.  She has history of gastric bypass surgery many years ago that she was told would help with her reflux and her Barrett's esophagus.  She reports intermittent severe epigastric abdominal pain as well as left lower quadrant abdominal pain.  She says that the LLQ pain comes and goes so she figured it was not diverticulitis.  She also talks about her constipation.  Says that she drinks water and takes probiotics and stool softeners.  She says that she has been able unable to take any other type of regimen including MiraLAX as it causes her a lot of flatulence that she is unable to hold.  Says is very embarrassing when in public places.  She is asking for refills on her Nexium 40 mg daily dicyclomine 10 mg as needed and Carafate suspension.  She says that everybody keeps telling her that if she is unable to gain weight or continues to lose weight then she would need to have a feeding tube placed.  Past Medical History:  Diagnosis Date  . Allergy   . Anal fissure 10/08/2016  . Anemia    anemia in the past  . Angina    takes Propanolol prn and it stops her chest  . Anxiety   . Barrett esophagus    not consistently present  . Cataract   . Chronic kidney disease    kidney stones and UTI  . Complication of anesthesia    either  BP or pulse  dropped with last surgery  . DDD (degenerative disc disease)    cervical and lumbar  . Dementia ([place])   . Depression    post traumatic stress  disorder  . Diabetes ([place]) 11/18/2016  . Diabetes mellitus    sugar goes up and down diet controlled  . Dysrhythmia    brought on by stress  . External hemorrhoids   . Fatigue   . Fibromyalgia    neuropathy knees ankles and toes, bladder  . GERD (gastroesophageal reflux disease)   . H/O hyperparathyroidism   . Headache(784.0)    migraines  . Hiatal hernia   . History of kidney stones   . Hypertension    3 small brain aneurysms  . Hypoglycemia 11/08/2016  . IBS (irritable bowel syndrome)   . Internal hemorrhoids   . Macular degeneration   . Migraines   . Mitral regurgitation 12/11/2015  . Osteoarthritis    all over  . Osteopetrosis   . Peripheral vascular disease ([facility])    superficial phlebitis in left calf  . Personal history of colonic polyps 07/22/2013   07/2013 - 3 small adenomas - repeat colonoscopy 2018  . Small intestinal bacterial overgrowth 08/05/2017   + lactulose breath test 06/2017 Xifaxan Rx   Past  Surgical History:  Procedure Laterality Date  . ABDOMINAL HYSTERECTOMY    . APPENDECTOMY    . CARDIAC CATHETERIZATION  03/22/1991   EF 61%  . CARDIOVASCULAR STRESS TEST  10/03/2006  . CHOLECYSTECTOMY    . COLONOSCOPY  06/23/2008   normal  . DILATION AND CURETTAGE OF UTERUS     x2  . ESOPHAGUS SURGERY    . EXPLORATORY LAPAROTOMY  1993  . EYE SURGERY    . GASTRIC BYPASS  1999  . GASTRIC RESTRICTION SURGERY  1991  . LASIK Bilateral   . Right Arm Surgery    . Right Knee Arthroscopy    . Rt wrist fx  2009  . spinal surgery battery implant    . stomach stappeling  1991  . STONE EXTRACTION WITH BASKET  2012  . TONSILLECTOMY    . TUBAL LIGATION    . UPPER GASTROINTESTINAL ENDOSCOPY  06/23/2008   w/Dil, Barrett's esophagus  . US ECHOCARDIOGRAPHY  01/21/2007   EF 55-60%  . US ECHOCARDIOGRAPHY  08/30/2003   EF 55-60%     reports that she has never smoked. She has never used smokeless tobacco. She reports that she does not drink alcohol and does not use drugs. family history includes Breast cancer in her sister; Clotting disorder in her sister; Colon cancer in her maternal aunt, paternal aunt, and paternal aunt; Diabetes in her mother; Heart disease in her father, mother, sister, and another family member; Hypertension in her father; Kidney disease in her father and sister; Lung cancer in her father, mother, and sister; Pancreatic cancer in her sister; Seizures in her father; Stroke in her father and mother; Uterine cancer in an other family member. Allergies  Allergen Reactions  . Ambien [Zolpidem Tartrate]     Hallucinations  . Codeine Anaphylaxis  . Oxycodone-Acetaminophen Shortness Of Breath  . Tylox [Oxycodone-Acetaminophen] Anaphylaxis    Chest pain  . Darvocet [Propoxyphene N-Acetaminophen]     Chest pain  . Darvon [Propoxyphene] Itching  . Lorcet [Hydrocodone-Acetaminophen]     Chest pain  . Opium     hallucinations  . Vicodin [Hydrocodone-Acetaminophen] Other (See Comments)    Chest Pain   . Wheat Bran   . Amoxicillin Nausea And Vomiting    Reaction:  Migraine headache  . Penicillins Nausea And Vomiting    Migraine Headaches      Outpatient Encounter Medications as of 11/10/2019  Medication Sig  . acarbose (PRECOSE) 50 MG tablet Take 1 tablet (50 mg total) by mouth 3 (three) times daily with meals.  Marland Kitchen aspirin-acetaminophen-caffeine (EXCEDRIN MIGRAINE) 250-250-65 MG tablet Take 2 tablets by mouth 2 (two) times daily.  . Blood Glucose Monitoring Suppl (FREESTYLE FREEDOM LITE) w/Device KIT 1 each by Does not apply route 4 (four) times daily. Use to monitor glucose levels 4 times daily; E11.9  . clonazePAM (KLONOPIN) 0.5 MG tablet Take 0.25 mg by mouth 2 (two) times daily.  . Continuous Blood Gluc Receiver (FREESTYLE LIBRE 14 DAY READER) DEVI 1 each by Does not apply route See admin instructions.  Use with sensors to monitor glucose levels; E11.9  . Continuous Blood Gluc Sensor (FREESTYLE LIBRE 14 DAY SENSOR) MISC 1 Device by Does not apply route every 14 (fourteen) days.  . cyclobenzaprine (FLEXERIL) 10 MG tablet Take 1 tablet (10 mg total) by mouth 3 (three) times daily as needed for muscle spasms. 90 day supply  . cycloSPORINE (RESTASIS) 0.05 % ophthalmic emulsion Place 1 drop into both eyes 2 (two) times daily.  Marland Kitchen  diclofenac sodium (VOLTAREN) 1 % GEL Apply 2 g topically 4 (four) times daily.  Marland Kitchen dicyclomine (BENTYL) 10 MG capsule Take 1-2 tab 3 times daily AC as needed for spasms and cramping.  . diltiazem 2 % GEL Apply 1 application topically 2 (two) times daily as needed (pain from fissure).  . DULoxetine (CYMBALTA) 30 MG capsule Take 30 mg by mouth 2 (two) times daily.  Marland Kitchen esomeprazole (NEXIUM) 40 MG capsule Take 1 capsule (40 mg total) by mouth daily before breakfast.  . fentaNYL (DURAGESIC - DOSED MCG/HR) 50 MCG/HR Place 50 mcg onto the skin every 3 (three) days.  . flurbiprofen (ANSAID) 100 MG tablet Take 1 tablet (100 mg total) by mouth every 8 (eight) hours as needed (maximum 3 tablets in 24 hours).  . Galcanezumab-gnlm (EMGALITY) 120 MG/ML SOAJ Inject 120 mg into the skin every 28 (twenty-eight) days.  Marland Kitchen glucagon 1 MG injection Inject 1 mg into the muscle once as needed for up to 1 dose.  Marland Kitchen glucose blood (FREESTYLE TEST STRIPS) test strip Use to monitor glucose levels 4 times daily; E11.9  . HYDROcodone-acetaminophen (NORCO) 10-325 MG tablet Take 1 tablet by mouth every 6 (six) hours as needed for moderate pain.   . hypromellose (SYSTANE OVERNIGHT THERAPY) 0.3 % GEL ophthalmic ointment Place 1 drop into both eyes at bedtime. Reported on 02/11/2015  . Lancets (FREESTYLE) lancets Use to monitor glucose levels 4 times daily; E11.9  . lidocaine (LIDODERM) 5 % Place 1 patch onto the skin as needed. Remove & Discard patch within 12 hours. May dispense as 3 month supply  . Lifitegrast  (XIIDRA) 5 % SOLN Place 1 drop into both eyes daily.   . meloxicam (MOBIC) 15 MG tablet Take 1 tablet (15 mg total) by mouth daily. Prn pain  . metroNIDAZOLE (METROGEL) 1 % gel Apply topically daily.  . mirabegron ER (MYRBETRIQ) 25 MG TB24 tablet Take 1 tablet (25 mg total) by mouth daily.  . Multiple Vitamins-Minerals (PRESERVISION AREDS) CAPS Take 1 capsule by mouth 2 (two) times daily.   . naloxegol oxalate (MOVANTIK) 25 MG TABS tablet Take 25 mg by mouth daily.  . Naloxone HCl 0.4 MG/0.4ML SOAJ Administer 0.37m [facility] at first sign of opioid overdose and repeat every 2 minutes as needed for resuscitation. Call 911 immediately  . NON FORMULARY [facility]  Peripheral Neuropathy Cream- Bupivacaine 1%, Doxepin 3%, Gabapentin 6%, Pentoxifylline 3%, Topiramate 1% Apply 1-2 grams to affected area 3-4 times daily Qty. 120 gm 3 refills  . ondansetron (ZOFRAN) 8 MG tablet Take 1 tablet (8 mg total) by mouth every 8 (eight) hours as needed for nausea or vomiting.  . pregabalin (LYRICA) 50 MG capsule Take 50 mg by mouth 2 (two) times daily.  . Probiotic Product (PROBIOTIC-10 PO) Take by mouth.   . sucralfate (CARAFATE) 1 GM/10ML suspension Take 10 mLs (1 g total) by mouth 4 (four) times daily.  . SUMAtriptan (IMITREX) 100 MG tablet Take 1 tablet at earliest onset of headache, may repeat in 2 hours if headache persists or reoccurs.  . Vitamin D, Ergocalciferol, (DRISDOL) 1.25 MG (50000 UT) CAPS capsule Take 1 capsule (50,000 Units total) by mouth every 7 (seven) days.  . [DISCONTINUED] DULoxetine (CYMBALTA) 30 MG capsule Take 3 capsules (90 mg total) by mouth daily. (Patient not taking: Reported on 11/10/2019)  . [DISCONTINUED] pregabalin (LYRICA) 50 MG capsule Take 1 capsule (50 mg total) by mouth 3 (three) times daily. 90 day supply. (Patient taking differently: Take 50 mg  by mouth 2 (two) times daily. 90 day supply.)  . [DISCONTINUED] tizanidine (ZANAFLEX) 2 MG capsule TAKE 1 CAPSULE (2 MG TOTAL)  BY MOUTH 3 (THREE) TIMES DAILY. (Patient not taking: Reported on 11/10/2019)  . [DISCONTINUED] valACYclovir (VALTREX) 1000 MG tablet Take 1 tablet (1,000 mg total) by mouth 3 (three) times daily. (Patient not taking: Reported on 11/10/2019)   No facility-administered encounter medications on file as of 11/10/2019.     REVIEW OF SYSTEMS  : All other systems reviewed and negative except where noted in the History of Present Illness.   PHYSICAL EXAM: BP (!) 104/58 (BP Location: Right Arm, Patient Position: Sitting, Cuff Size: Normal)   Pulse 92   Ht '5\' 5"'  (1.651 m)   Wt 105 lb 6 oz (47.8 kg)   SpO2 99%   BMI 17.54 kg/m  General:  Very thin and frail white female in no acute distress Head: Normocephalic and atraumatic Eyes:  Sclerae anicteric, conjunctiva pink. Ears: Normal auditory acuity Lungs: Clear throughout to auscultation; no W/R/R. Heart: Regular rate and rhythm; no M/R/G. Abdomen: Soft, non-distended.  Extensive scars noted on abdomen from previous surgeries.  Bowel sounds present.  Mild diffuse tenderness to palpation. Musculoskeletal: Symmetrical with no gross deformities  Skin: No lesions on visible extremities Extremities: No edema  Neurological: Alert oriented x 4, grossly nonfocal Cervical Nodes:  No significant cervical adenopathy Psychological:  Alert and cooperative. Normal mood and affect  ASSESSMENT AND PLAN: *Abdominal pain, both epigastric and left-sided: We will check a CT scan of the abdomen and pelvis with contrast to rule out any major issues.  I think that this will give her peace of mind.  Will check BMP today for renal function.  I think some of her pain especially her epigastric/upper abdominal pain may be from adhesive disease/scar tissue. *Poor appetite, malnutrition, inability to eat and gain weight: There has been discussion about placing a gastrostomy tube. *Constipation: Takes probiotic and stool softeners, but still struggles.  Unfortunately she  reports several issues with flatulence, etc. with other regimens such as MiraLAX, etc. in the past.  **We will renew prescriptions for Nexium, Bentyl, and Carafate at the patient's request.   CC:  Shawnee Knapp, MD

## 2019-11-10 NOTE — Patient Instructions (Signed)
If you are age 72 or older, your body mass index should be between 23-30. Your Body mass index is 17.54 kg/m. If this is out of the aforementioned range listed, please consider follow up with your Primary Care Provider.  If you are age 3 or younger, your body mass index should be between 19-25. Your Body mass index is 17.54 kg/m. If this is out of the aformentioned range listed, please consider follow up with your Primary Care Provider.   Your provider has requested that you go to the basement level for lab work before leaving today. Press "B" on the elevator. The lab is located at the first door on the left as you exit the elevator.   We have sent the following medications to your pharmacy for you to pick up at your convenience: Bentyl, Nexium, Carafate.   You have been scheduled for a CT scan of the abdomen and pelvis at [facility], 1st floor Radiology. You are scheduled on Thursday 11/18/19  at 8:30 am. You should arrive 15 minutes prior to your appointment time for registration. The solution may taste better if refrigerated, but do NOT add ice or any other liquid to this solution. Shake well before drinking.   Please follow the written instructions below on the day of your exam:   1) Do not eat anything after 4:30 am (4 hours prior to your test)   2) Drink 1 bottle of contrast @ 6:30  am (2 hours prior to your exam)  Remember to shake well before drinking and do NOT pour over ice.     Drink 1 bottle of contrast @ 7:30 am (1 hour prior to your exam)   You may take any medications as prescribed with a small amount of water, if necessary. If you take any of the following medications: METFORMIN, GLUCOPHAGE, GLUCOVANCE, AVANDAMET, RIOMET, FORTAMET, [place] MET, JANUMET, GLUMETZA or METAGLIP, you MAY be asked to HOLD this medication 48 hours AFTER the exam.   The purpose of you drinking the oral contrast is to aid in the visualization of your intestinal tract. The contrast solution  may cause some diarrhea. Depending on your individual set of symptoms, you may also receive an intravenous injection of x-ray contrast/dye. Plan on being at [facility] for 45 minutes or longer, depending on the type of exam you are having performed.   If you have any questions regarding your exam or if you need to reschedule, you may call [facility] at [phone] between the hours of 8:00 am and 5:00 pm, Monday-Friday.

## 2019-11-11 ENCOUNTER — Encounter: Payer: Self-pay | Admitting: Gastroenterology

## 2019-11-11 DIAGNOSIS — E43 Unspecified severe protein-calorie malnutrition: Secondary | ICD-10-CM | POA: Diagnosis not present

## 2019-11-11 DIAGNOSIS — Z79891 Long term (current) use of opiate analgesic: Secondary | ICD-10-CM | POA: Diagnosis not present

## 2019-11-11 DIAGNOSIS — E1121 Type 2 diabetes mellitus with diabetic nephropathy: Secondary | ICD-10-CM | POA: Diagnosis not present

## 2019-11-11 DIAGNOSIS — G894 Chronic pain syndrome: Secondary | ICD-10-CM | POA: Diagnosis not present

## 2019-11-11 DIAGNOSIS — K5900 Constipation, unspecified: Secondary | ICD-10-CM | POA: Diagnosis not present

## 2019-11-11 DIAGNOSIS — G609 Hereditary and idiopathic neuropathy, unspecified: Secondary | ICD-10-CM | POA: Diagnosis not present

## 2019-11-11 DIAGNOSIS — E11649 Type 2 diabetes mellitus with hypoglycemia without coma: Secondary | ICD-10-CM | POA: Diagnosis not present

## 2019-11-11 DIAGNOSIS — F418 Other specified anxiety disorders: Secondary | ICD-10-CM | POA: Diagnosis not present

## 2019-11-11 DIAGNOSIS — K22719 Barrett's esophagus with dysplasia, unspecified: Secondary | ICD-10-CM | POA: Diagnosis not present

## 2019-11-11 DIAGNOSIS — K279 Peptic ulcer, site unspecified, unspecified as acute or chronic, without hemorrhage or perforation: Secondary | ICD-10-CM | POA: Diagnosis not present

## 2019-11-11 DIAGNOSIS — F5000 Anorexia nervosa, unspecified: Secondary | ICD-10-CM

## 2019-11-11 DIAGNOSIS — R1032 Left lower quadrant pain: Secondary | ICD-10-CM

## 2019-11-11 DIAGNOSIS — R1013 Epigastric pain: Secondary | ICD-10-CM

## 2019-11-11 DIAGNOSIS — R32 Unspecified urinary incontinence: Secondary | ICD-10-CM | POA: Diagnosis not present

## 2019-11-11 DIAGNOSIS — G43001 Migraine without aura, not intractable, with status migrainosus: Secondary | ICD-10-CM | POA: Diagnosis not present

## 2019-11-11 DIAGNOSIS — K588 Other irritable bowel syndrome: Secondary | ICD-10-CM | POA: Diagnosis not present

## 2019-11-11 DIAGNOSIS — E46 Unspecified protein-calorie malnutrition: Secondary | ICD-10-CM

## 2019-11-11 DIAGNOSIS — M47816 Spondylosis without myelopathy or radiculopathy, lumbar region: Secondary | ICD-10-CM | POA: Diagnosis not present

## 2019-11-11 HISTORY — DX: Anorexia nervosa, unspecified: F50.00

## 2019-11-11 HISTORY — DX: Left lower quadrant pain: R10.32

## 2019-11-11 HISTORY — DX: Epigastric pain: R10.13

## 2019-11-11 HISTORY — DX: Unspecified protein-calorie malnutrition: E46

## 2019-11-12 ENCOUNTER — Ambulatory Visit (INDEPENDENT_AMBULATORY_CARE_PROVIDER_SITE_OTHER): Payer: Medicare Other | Admitting: Vascular Surgery

## 2019-11-12 ENCOUNTER — Encounter: Payer: Self-pay | Admitting: Vascular Surgery

## 2019-11-12 ENCOUNTER — Ambulatory Visit (HOSPITAL_COMMUNITY)
Admission: RE | Admit: 2019-11-12 | Discharge: 2019-11-12 | Disposition: A | Discharge status: Home / Self Care | Payer: Medicare Other | Source: Ambulatory Visit | Attending: Vascular Surgery | Admitting: Vascular Surgery

## 2019-11-12 ENCOUNTER — Other Ambulatory Visit: Payer: Self-pay

## 2019-11-12 VITALS — BP 118/72 | HR 95 | Temp 98.6°F | Resp 20 | Ht 65.0 in | Wt 106.4 lb

## 2019-11-12 DIAGNOSIS — I739 Peripheral vascular disease, unspecified: Secondary | ICD-10-CM

## 2019-11-12 DIAGNOSIS — E1140 Type 2 diabetes mellitus with diabetic neuropathy, unspecified: Secondary | ICD-10-CM

## 2019-11-12 NOTE — Progress Notes (Signed)
CHIEF COMPLAINT:   Left > R lower extremity pain  ASSESSMENT & PLAN:  74 y.o. female with history of superficial venous thrombosis of the left lower extremity. Mild venous insufficiency on exam. No evidence of peripheral arterial disease.   Recommend compression garments, elevation, exercise as tolerated.  Follow up PRN  Yevonne Aline. Stanford Breed, MD [facility] Office: [phone] 11/12/2019  HISTORY:   HISTORY OF PRESENT ILLNESS: Kayla [name] is a 74 y.o. female with a difficult course after Roux-en-Y gastric bypass done for question of Barrett's esophagus in the late 90s.  She has since struggled with malnutrition and brittle type 2 diabetes.  She is become cachectic in the past several months.  When she was much younger she had a episode of superficial venous thrombosis in the vessels of her left calf which caused her some pain since.  She has severe peripheral neuropathy in bilateral lower extremities consistent with severe diabetic neuropathy.  She has had a spinal stimulator implanted to try to treat some of the symptoms.  She presents to clinic today looking for a potential other cause of her symptoms.  She has mild peripheral edema.  She has no venous ulceration.  No venous dermatitis.  She is not bothered by any varicosities on her legs.  Past Medical History:  Diagnosis Date  . Allergy   . Anal fissure 10/08/2016  . Anemia    anemia in the past  . Angina    takes Propanolol prn and it stops her chest  . Anxiety   . Barrett esophagus    not consistently present  . Cataract   . Chronic kidney disease    kidney stones and UTI  . Complication of anesthesia    either  BP or pulse dropped with last surgery  . DDD (degenerative disc disease)    cervical and lumbar  . Dementia ([place])   . Depression    post traumatic stress  disorder  . Diabetes ([place]) 11/18/2016  . Diabetes mellitus    sugar goes up and down diet controlled  . Dysrhythmia     brought on by stress  . External hemorrhoids   . Fatigue   . Fibromyalgia    neuropathy knees ankles and toes, bladder  . GERD (gastroesophageal reflux disease)   . H/O hyperparathyroidism   . Headache(784.0)    migraines  . Hiatal hernia   . History of kidney stones   . Hypertension    3 small brain aneurysms  . Hypoglycemia 11/08/2016  . IBS (irritable bowel syndrome)   . Internal hemorrhoids   . Macular degeneration   . Migraines   . Mitral regurgitation 12/11/2015  . Osteoarthritis    all over  . Osteopetrosis   . Peripheral vascular disease ([facility])    superficial phlebitis in left calf  . Personal history of colonic polyps 07/22/2013   07/2013 - 3 small adenomas - repeat colonoscopy 2018  . Small intestinal bacterial overgrowth 08/05/2017   + lactulose breath test 06/2017 Xifaxan Rx    Past Surgical History:  Procedure Laterality Date  . ABDOMINAL HYSTERECTOMY    . APPENDECTOMY    . CARDIAC CATHETERIZATION  03/22/1991   EF 61%  . CARDIOVASCULAR STRESS TEST  10/03/2006  . CHOLECYSTECTOMY    . COLONOSCOPY  06/23/2008   normal  . DILATION AND CURETTAGE OF UTERUS     x2  . ESOPHAGUS SURGERY    . EXPLORATORY LAPAROTOMY  1993  . EYE  SURGERY    . GASTRIC BYPASS  1999  . GASTRIC RESTRICTION SURGERY  1991  . LASIK Bilateral   . Right Arm Surgery    . Right Knee Arthroscopy    . Rt wrist fx  2009  . spinal surgery battery implant    . stomach stappeling  1991  . STONE EXTRACTION WITH BASKET  2012  . TONSILLECTOMY    . TUBAL LIGATION    . UPPER GASTROINTESTINAL ENDOSCOPY  06/23/2008   w/Dil, Barrett's esophagus  . US ECHOCARDIOGRAPHY  01/21/2007   EF 55-60%  . US ECHOCARDIOGRAPHY  08/30/2003   EF 55-60%    Family History  Problem Relation Age of Onset  . Stroke Mother   . Lung cancer Mother   . Heart disease Mother   . Diabetes Mother   . Hypertension Father   . Stroke Father   . Lung cancer Father   . Heart disease Father   . Kidney disease Father   . Seizures  Father        epilepsy, and sisters x 2  . Pancreatic cancer Sister   . Lung cancer Sister   . Colon cancer Maternal Aunt        12 relatives  . Uterine cancer Other        aunt  . Heart disease Other        grandmother  . Clotting disorder Sister   . Heart disease Sister        x 2  . Kidney disease Sister        x 2  . Breast cancer Sister   . Colon cancer Paternal Aunt   . Colon cancer Paternal Aunt   . Esophageal cancer Neg Hx   . Rectal cancer Neg Hx   . Stomach cancer Neg Hx     Social History   Socioeconomic History  . Marital status: Married    Spouse name: Rushie Chestnut.  . Number of children: 2  . Years of education: Not on file  . Highest education level: Not on file  Occupational History  . Occupation: Disability    Employer: RETIRED  Tobacco Use  . Smoking status: Never Smoker  . Smokeless tobacco: Never Used  Vaping Use  . Vaping Use: Never used  Substance and Sexual Activity  . Alcohol use: No  . Drug use: No  . Sexual activity: Not Currently  Other Topics Concern  . Not on file  Social History Narrative   Right handed   Two story home   She retired on disability   Married   2 Children   Social Determinants of Health   Financial Resource Strain:   . Difficulty of Paying Living Expenses: Not on file  Food Insecurity:   . Worried About Charity fundraiser in the Last Year: Not on file  . Ran Out of Food in the Last Year: Not on file  Transportation Needs:   . Lack of Transportation (Medical): Not on file  . Lack of Transportation (Non-Medical): Not on file  Physical Activity:   . Days of Exercise per Week: Not on file  . Minutes of Exercise per Session: Not on file  Stress:   . Feeling of Stress : Not on file  Social Connections:   . Frequency of Communication with Friends and Family: Not on file  . Frequency of Social Gatherings with Friends and Family: Not on file  . Attends Religious Services: Not on file  . Active  Member of Clubs or  Organizations: Not on file  . Attends Archivist Meetings: Not on file  . Marital Status: Not on file  Intimate Partner Violence:   . Fear of Current or Ex-Partner: Not on file  . Emotionally Abused: Not on file  . Physically Abused: Not on file  . Sexually Abused: Not on file    Allergies  Allergen Reactions  . Ambien [Zolpidem Tartrate]     Hallucinations  . Codeine Anaphylaxis  . Oxycodone-Acetaminophen Shortness Of Breath  . Tylox [Oxycodone-Acetaminophen] Anaphylaxis    Chest pain  . Darvocet [Propoxyphene N-Acetaminophen]     Chest pain  . Darvon [Propoxyphene] Itching  . Lorcet [Hydrocodone-Acetaminophen]     Chest pain  . Opium     hallucinations  . Vicodin [Hydrocodone-Acetaminophen] Other (See Comments)    Chest Pain   . Wheat Bran   . Amoxicillin Nausea And Vomiting    Reaction:  Migraine headache  . Penicillins Nausea And Vomiting    Migraine Headaches    Current Outpatient Medications  Medication Sig Dispense Refill  . acarbose (PRECOSE) 50 MG tablet Take 1 tablet (50 mg total) by mouth 3 (three) times daily with meals. 270 tablet 3  . aspirin-acetaminophen-caffeine (EXCEDRIN MIGRAINE) 250-250-65 MG tablet Take 2 tablets by mouth 2 (two) times daily.    . Blood Glucose Monitoring Suppl (FREESTYLE FREEDOM LITE) w/Device KIT 1 each by Does not apply route 4 (four) times daily. Use to monitor glucose levels 4 times daily; E11.9 1 kit 0  . clonazePAM (KLONOPIN) 0.5 MG tablet Take 0.25 mg by mouth 2 (two) times daily.    . Continuous Blood Gluc Receiver (FREESTYLE LIBRE 14 DAY READER) DEVI 1 each by Does not apply route See admin instructions. Use with sensors to monitor glucose levels; E11.9 1 each 0  . Continuous Blood Gluc Sensor (FREESTYLE LIBRE 14 DAY SENSOR) MISC 1 Device by Does not apply route every 14 (fourteen) days. 6 each 3  . cyclobenzaprine (FLEXERIL) 10 MG tablet Take 1 tablet (10 mg total) by mouth 3 (three) times daily as needed for muscle  spasms. 90 day supply 270 tablet 3  . cycloSPORINE (RESTASIS) 0.05 % ophthalmic emulsion Place 1 drop into both eyes 2 (two) times daily. 5.5 mL 5  . diclofenac sodium (VOLTAREN) 1 % GEL Apply 2 g topically 4 (four) times daily. 700 g 3  . dicyclomine (BENTYL) 10 MG capsule Take 1-2 tab 3 times daily AC as needed for spasms and cramping. 270 capsule 1  . diltiazem 2 % GEL Apply 1 application topically 2 (two) times daily as needed (pain from fissure). 30 g 2  . DULoxetine (CYMBALTA) 30 MG capsule Take 30 mg by mouth 2 (two) times daily.    Marland Kitchen esomeprazole (NEXIUM) 40 MG capsule Take 1 capsule (40 mg total) by mouth daily before breakfast. 90 capsule 3  . fentaNYL (DURAGESIC - DOSED MCG/HR) 50 MCG/HR Place 50 mcg onto the skin every 3 (three) days.    . flurbiprofen (ANSAID) 100 MG tablet Take 1 tablet (100 mg total) by mouth every 8 (eight) hours as needed (maximum 3 tablets in 24 hours). 24 tablet 3  . Galcanezumab-gnlm (EMGALITY) 120 MG/ML SOAJ Inject 120 mg into the skin every 28 (twenty-eight) days. 3 pen 3  . glucagon 1 MG injection Inject 1 mg into the muscle once as needed for up to 1 dose. 1 each 12  . glucose blood (FREESTYLE TEST STRIPS) test strip Use to  monitor glucose levels 4 times daily; E11.9 200 each 0  . HYDROcodone-acetaminophen (NORCO) 10-325 MG tablet Take 1 tablet by mouth every 6 (six) hours as needed for moderate pain.     . hypromellose (SYSTANE OVERNIGHT THERAPY) 0.3 % GEL ophthalmic ointment Place 1 drop into both eyes at bedtime. Reported on 02/11/2015    . Lancets (FREESTYLE) lancets Use to monitor glucose levels 4 times daily; E11.9 200 each 0  . lidocaine (LIDODERM) 5 % Place 1 patch onto the skin as needed. Remove & Discard patch within 12 hours. May dispense as 3 month supply 90 patch 3  . Lifitegrast (XIIDRA) 5 % SOLN Place 1 drop into both eyes daily.     . meloxicam (MOBIC) 15 MG tablet Take 1 tablet (15 mg total) by mouth daily. Prn pain 90 tablet 1  .  metroNIDAZOLE (METROGEL) 1 % gel Apply topically daily. 45 g 0  . mirabegron ER (MYRBETRIQ) 25 MG TB24 tablet Take 1 tablet (25 mg total) by mouth daily. 90 tablet 1  . Multiple Vitamins-Minerals (PRESERVISION AREDS) CAPS Take 1 capsule by mouth 2 (two) times daily.     . naloxegol oxalate (MOVANTIK) 25 MG TABS tablet Take 25 mg by mouth daily.    . Naloxone HCl 0.4 MG/0.4ML SOAJ Administer 0.60m  at first sign of opioid overdose and repeat every 2 minutes as needed for resuscitation. Call 911 immediately 5 Package 0  . NON FORMULARY [facility]  Peripheral Neuropathy Cream- Bupivacaine 1%, Doxepin 3%, Gabapentin 6%, Pentoxifylline 3%, Topiramate 1% Apply 1-2 grams to affected area 3-4 times daily Qty. 120 gm 3 refills    . ondansetron (ZOFRAN) 8 MG tablet Take 1 tablet (8 mg total) by mouth every 8 (eight) hours as needed for nausea or vomiting. 60 tablet 0  . pregabalin (LYRICA) 50 MG capsule Take 50 mg by mouth 2 (two) times daily.    . Probiotic Product (PROBIOTIC-10 PO) Take by mouth.     . sucralfate (CARAFATE) 1 GM/10ML suspension Take 10 mLs (1 g total) by mouth 4 (four) times daily. 420 mL 1  . SUMAtriptan (IMITREX) 100 MG tablet Take 1 tablet at earliest onset of headache, may repeat in 2 hours if headache persists or reoccurs. 30 tablet 1  . Vitamin D, Ergocalciferol, (DRISDOL) 1.25 MG (50000 UT) CAPS capsule Take 1 capsule (50,000 Units total) by mouth every 7 (seven) days. 12 capsule 3   No current facility-administered medications for this visit.    REVIEW OF SYSTEMS:  '[X]'  denotes positive finding, '[ ]'  denotes negative finding Cardiac  Comments:  Chest pain or chest pressure:    Shortness of breath upon exertion:    Short of breath when lying flat:    Irregular heart rhythm: X Pt reports mild MVP and MVR      Vascular    Pain in calf, thigh, or hip brought on by ambulation: x   Pain in feet at night that wakes you up from your sleep:  x   Blood clot in your  veins: x   Leg swelling:  x       Pulmonary    Oxygen at home:    Productive cough:     Wheezing:         Neurologic  Severe neuropathy  Sudden weakness in arms or legs:  x   Sudden numbness in arms or legs:  x   Sudden onset of difficulty speaking or slurred speech:    Temporary loss  of vision in one eye:     Problems with dizziness:         Gastrointestinal    Blood in stool:     Vomited blood:         Genitourinary    Burning when urinating:     Blood in urine:        Psychiatric  anxiety  Major depression:         Hematologic    Bleeding problems:    Problems with blood clotting too easily:        Skin    Rashes or ulcers:        Constitutional    Fever or chills:     PHYSICAL EXAM:   Vitals:   11/12/19 1432  BP: 118/72  Pulse: 95  Resp: 20  Temp: 98.6 F (37 C)  TempSrc: Temporal  SpO2: 95%  Weight: 106 lb 6.4 oz (48.3 kg)  Height: '5\' 5"'  (1.651 m)    Constitutional: Cachectic elderly female in no distress. Appears mal nourished.  Neurologic: Normal gait and station. CN intact. No weakness. No sensory loss. Psychiatric: Mood and affect symmetric and appropriate. Eyes: No icterus. No conjunctival pallor. Ears, nose, throat: mucous membranes moist. midline trachea.  Cardiac: regular rate and rhythm.  Respiratory: unlabored Abdominal: soft, non-tender, non-distended.  Peripheral vascular:  Dorsalis pedis pulse: L 2+ / R 2+  Posterior tibial pulse: L 2+ / R 2+ Extremity: Mild edema. no cyanosis. no pallor.  Skin: no gangrene. no ulceration.  Lymphatic: no Stemmer's sign. no palpable lymphadenopathy.  DATA REVIEW:    Most recent CBC CBC Latest Ref Rng & Units 07/06/2018 01/22/2018 12/29/2017  WBC 3.4 - 10.8 x10E3/uL 8.1 6.6 6.5  Hemoglobin 11.1 - 15.9 g/dL 12.1 11.6 13.2  Hematocrit 34.0 - 46.6 % 35.0 34.7 39.3  Platelets 150 - 450 x10E3/uL 277 262 330     Most recent CMP CMP Latest Ref Rng & Units 11/10/2019 08/03/2019 07/06/2018  Glucose 70 -  99 mg/dL 87 76 78  BUN 6 - 23 mg/dL 22 - 19  Creatinine 0.40 - 1.20 mg/dL 0.68 - 0.68  Sodium 135 - 145 mEq/L 139 - 143  Potassium 3.5 - 5.1 mEq/L 4.7 - 4.9  Chloride 96 - 112 mEq/L 105 - 105  CO2 19 - 32 mEq/L 31 - 25  Calcium 8.4 - 10.5 mg/dL 8.9 - 9.5  Total Protein 6.0 - 8.5 g/dL - - 6.1  Total Bilirubin 0.0 - 1.2 mg/dL - - 0.3  Alkaline Phos 39 - 117 IU/L - - 72  AST 0 - 40 IU/L - - 26  ALT 0 - 32 IU/L - - 22     Vascular Imaging: ABI 11/12/19  I personally reviewed the above study. It is significant for no evidence of hemodynamically significant PAD.

## 2019-11-18 ENCOUNTER — Ambulatory Visit (HOSPITAL_COMMUNITY): Admission: RE | Admit: 2019-11-18 | Payer: Medicare Other | Source: Ambulatory Visit

## 2019-11-18 ENCOUNTER — Encounter (HOSPITAL_COMMUNITY): Payer: Self-pay

## 2019-11-23 DIAGNOSIS — M792 Neuralgia and neuritis, unspecified: Secondary | ICD-10-CM | POA: Diagnosis not present

## 2019-12-10 ENCOUNTER — Other Ambulatory Visit: Payer: Self-pay | Admitting: Endocrinology

## 2019-12-13 ENCOUNTER — Other Ambulatory Visit: Payer: Self-pay | Admitting: Neurology

## 2019-12-13 ENCOUNTER — Telehealth: Payer: Self-pay | Admitting: Neurology

## 2019-12-13 DIAGNOSIS — G894 Chronic pain syndrome: Secondary | ICD-10-CM | POA: Diagnosis not present

## 2019-12-13 DIAGNOSIS — G43C0 Periodic headache syndromes in child or adult, not intractable: Secondary | ICD-10-CM

## 2019-12-13 MED ORDER — PREGABALIN 50 MG PO CAPS
50.0000 mg | ORAL_CAPSULE | Freq: Two times a day (BID) | ORAL | 1 refills | Status: DC
Start: 2019-12-13 — End: 2020-09-19

## 2019-12-13 MED ORDER — SUMATRIPTAN SUCCINATE 100 MG PO TABS: ORAL_TABLET | ORAL | 1 refills | Status: DC

## 2019-12-13 NOTE — Telephone Encounter (Signed)
Send to [facility]

## 2019-12-13 NOTE — Telephone Encounter (Signed)
Patient called in needing a refill on her Imatrex and Lyrica

## 2019-12-13 NOTE — Telephone Encounter (Signed)
Done

## 2019-12-14 ENCOUNTER — Ambulatory Visit: Payer: Medicare Other | Admitting: Family Medicine

## 2019-12-15 DIAGNOSIS — R0600 Dyspnea, unspecified: Secondary | ICD-10-CM | POA: Diagnosis not present

## 2019-12-15 DIAGNOSIS — E559 Vitamin D deficiency, unspecified: Secondary | ICD-10-CM | POA: Diagnosis not present

## 2019-12-15 DIAGNOSIS — E1121 Type 2 diabetes mellitus with diabetic nephropathy: Secondary | ICD-10-CM | POA: Diagnosis not present

## 2019-12-15 DIAGNOSIS — G43001 Migraine without aura, not intractable, with status migrainosus: Secondary | ICD-10-CM | POA: Diagnosis not present

## 2019-12-15 DIAGNOSIS — E43 Unspecified severe protein-calorie malnutrition: Secondary | ICD-10-CM | POA: Diagnosis not present

## 2019-12-15 DIAGNOSIS — K22719 Barrett's esophagus with dysplasia, unspecified: Secondary | ICD-10-CM | POA: Diagnosis not present

## 2019-12-15 DIAGNOSIS — E162 Hypoglycemia, unspecified: Secondary | ICD-10-CM | POA: Diagnosis not present

## 2019-12-15 DIAGNOSIS — R634 Abnormal weight loss: Secondary | ICD-10-CM | POA: Diagnosis not present

## 2019-12-15 DIAGNOSIS — E11649 Type 2 diabetes mellitus with hypoglycemia without coma: Secondary | ICD-10-CM | POA: Diagnosis not present

## 2019-12-15 DIAGNOSIS — E672 Megavitamin-B6 syndrome: Secondary | ICD-10-CM | POA: Diagnosis not present

## 2019-12-15 DIAGNOSIS — R040 Epistaxis: Secondary | ICD-10-CM | POA: Diagnosis not present

## 2019-12-15 DIAGNOSIS — M130 Polyarthritis, unspecified: Secondary | ICD-10-CM | POA: Diagnosis not present

## 2019-12-15 DIAGNOSIS — J329 Chronic sinusitis, unspecified: Secondary | ICD-10-CM | POA: Diagnosis not present

## 2019-12-15 DIAGNOSIS — K911 Postgastric surgery syndromes: Secondary | ICD-10-CM | POA: Diagnosis not present

## 2019-12-15 DIAGNOSIS — R32 Unspecified urinary incontinence: Secondary | ICD-10-CM | POA: Diagnosis not present

## 2019-12-15 DIAGNOSIS — E1143 Type 2 diabetes mellitus with diabetic autonomic (poly)neuropathy: Secondary | ICD-10-CM | POA: Diagnosis not present

## 2019-12-15 DIAGNOSIS — R791 Abnormal coagulation profile: Secondary | ICD-10-CM | POA: Diagnosis not present

## 2019-12-15 DIAGNOSIS — Z7984 Long term (current) use of oral hypoglycemic drugs: Secondary | ICD-10-CM | POA: Diagnosis not present

## 2019-12-15 DIAGNOSIS — E441 Mild protein-calorie malnutrition: Secondary | ICD-10-CM | POA: Diagnosis not present

## 2019-12-15 DIAGNOSIS — Z0001 Encounter for general adult medical examination with abnormal findings: Secondary | ICD-10-CM | POA: Diagnosis not present

## 2019-12-15 DIAGNOSIS — Z79899 Other long term (current) drug therapy: Secondary | ICD-10-CM | POA: Diagnosis not present

## 2019-12-15 DIAGNOSIS — K588 Other irritable bowel syndrome: Secondary | ICD-10-CM | POA: Diagnosis not present

## 2019-12-15 DIAGNOSIS — G603 Idiopathic progressive neuropathy: Secondary | ICD-10-CM | POA: Diagnosis not present

## 2019-12-30 ENCOUNTER — Ambulatory Visit (INDEPENDENT_AMBULATORY_CARE_PROVIDER_SITE_OTHER): Payer: Medicare Other | Admitting: Family Medicine

## 2019-12-30 ENCOUNTER — Other Ambulatory Visit: Payer: Self-pay

## 2019-12-30 DIAGNOSIS — E43 Unspecified severe protein-calorie malnutrition: Secondary | ICD-10-CM

## 2019-12-30 DIAGNOSIS — E11649 Type 2 diabetes mellitus with hypoglycemia without coma: Secondary | ICD-10-CM | POA: Diagnosis not present

## 2019-12-30 NOTE — Patient Instructions (Addendum)
Check out the [employer]anut Butter single-use nut butters.    Some principles to keep in mind: When you have experienced a prolonged period of food restriction, it becomes harder to eat a normal amount of food.  Eating often becomes uncomfortable.   Here are three reasons why:  1. Your GI system is largely muscular, and when you have not been eating adequately, it is essentially out of "shape" - just like if you try to do push-ups when you've not done them for a long time.   2. When you eat very little, your body's need for all the hormones, enzymes, gut peptides, neurotransmitters, and other substances that are used to digest food are not produced in high quantities.  This means there is a phase of adaptation needed if you start eating normally again, as the body gears up the production of these substances.   3. When you starve yourself, you are also starving your gut microbes, which are crucial to digestion.    This means you have to eat to allow your body to get used to having a normal amount of food again.  It can be uncomfortable, but what can you do to distract yourself after eating?   - Read - Pray - Documentaries and videos - Quilt - Other crafts - Make greeting cards - Runner, broadcasting/film/video - Other? Make a written list of possible activities for after eating.  Plan for this activity before eating.    In addition, CONSISTENCY is very helpful as your body works to adapt to a normal amount of food.  Eat small amount of food frequently during the day, beginning with a breakfast within the first hour you get up.  Set your phone alarm for eating reminders.    When glucose is rising, eat some protein to try stabilize it rather than wait till it crashes.  You will feel better if you can avoid a blood glucose roller coaster.    Follow-up appt in the office on Thursday, 12/30 at 3:30 PM.

## 2019-12-30 NOTE — Progress Notes (Signed)
Medical Nutrition Therapy Appt start time: 1100 end time: 1200 (1 hour) Patient has completed 2nd dose of COVID-19 vaccine 04/20/19. Endocrinologist, Renato Shin, MD ([place]) Gastroenterologist Silvano Rusk, MD Lives with Husband Clair Gulling & 26-YO grandson Primary concerns today: BG management, malnutrition.  Relevant history/background: Ms. Gonsoulin was referred by [name], MD for medical nutrition therapy related to DM2 w/ hypoglyc w/out coma (E11.649), severe protein-calorie malnutrition (E43; BMI 17), and Barrett's esophag w/ dysplasia (K22.719).  Ms. Kinker said she had Roux-n-Y surgery for Barrett's esophagus in 1999.  Said she's had "eating problems" ever since that surgery.  Office notes from Dr. Brigitte Pulse indicate that Ms. Lo fluctuates between restricting & bingeing. Sometimes needs to throw up b/c feels she is choking. Often nauseated when eats.  Significant PMH: Ms. Turner has weighed as much as 230 lb 3 different times, and for many years engaged in some pretty extreme weight loss attempts, including keto diet, amphetamines, and HCG injections.    Assessment:  Ms. Pardy has not been able to gain weight despite efforts.  She was diagnosed with reactive hypoglycemia about 30 yrs ago, and said her endocrinologist Dr. Loanne Drilling has said she has an over-reactive pancreas.  She only feels ok with BG between 90 and 105    When BG is over 105, her symptoms include trembling and heart "pounding."  She has a glucagon pen for severe hypoglycemia, but has not yet used it.  Reaction to eating is usually within 5 minutes.    Learning Readiness: Ready; said she is ready to "try anything."  FBG: 95-96; uses CGM.  A1c is always <6.   Usual eating pattern: Always wakes up hungry, eating pattern includes breakfast and dinner, but rest of day is erratic; dependent on how stomach feels.  Induces vomiting 2-3 X day b/c of nausea and post-prandial choking sensation.   1/2 cup of veg's and 3 oz meat she can  usually eat without extreme reactions.  Key seems to be limiting carbs to <30 grams.   Frequent foods and beverages: ice water, 3 c coffee, each with 1 tsp pwdrd creamer; salads w/ chx brst/pork/beef/ham, homemade soups, veg's, microwavable dinners (28-30 g carb) for bkst, and ~3 X wk for dinner.   Avoided foods: most cheese (disliked), anchovies, most shellfish, avocados.   Usual physical activity: none currently; peripheral neuropathy limits mobility. Sleep: Bedtime 11:30 to 2 AM; gets up ~ 8 AM.  Wakes up hourly, but goes back to sleep (with help of trazadone).   24-hr recall: (Up at 5:30 AM) B (5:45 AM)-   1 c coffee, 1 tsp powdered creamer B (8 AM)-  1 small Hormel dinner (~2 oz chx breast, 1/2 c mashed potatoes, 2 tbsp gravy; 28 g carb, 17 g pro, 220 kcal), water  - Took Bentyl and Zofran after bkfst b/c of stomach pain and nausea 5 min after eating -  Snk ( AM)-   water L ( PM)-  --- Snk (4 PM)-  3 oz pork tenderloin, water D (5:30 PM)-  3 oz chx breast, 1/2 c peas, 1/2 c mixed veg's, water Snk (10 PM)-  1 apple, 1 c coffee, 1 tsp powdered creamer Typical day? No.  Usually home all day (but had appts yesterday), so often eats mid-day.  Consumes >64 oz water/24 hrs.  Sometimes has salted peanuts or peanut butter if BG too high.    Nutritional Diagnosis:  NI-1.4 Inadequate energy intake As related to energy needs.  As evidenced by diet history and  food recall indicating usual intake of 500-600 kcal/day.  Handouts given during visit include:  After-Visit Summary (AVS)  Demonstrated degree of understanding via:  Teach Back  Barriers to learning/adherence to lifestyle change: Abdominal pain with eating as well as longstanding eating behaviors.  Monitoring/Evaluation:  Dietary intake, exercise, FBG, and body weight in 2 week(s).

## 2020-01-03 ENCOUNTER — Other Ambulatory Visit: Payer: Self-pay | Admitting: Internal Medicine

## 2020-01-11 ENCOUNTER — Other Ambulatory Visit: Payer: Self-pay

## 2020-01-11 ENCOUNTER — Ambulatory Visit
Admission: RE | Admit: 2020-01-11 | Discharge: 2020-01-11 | Disposition: A | Discharge status: Home / Self Care | Payer: Medicare Other | Source: Ambulatory Visit | Attending: Family Medicine | Admitting: Family Medicine

## 2020-01-11 DIAGNOSIS — M810 Age-related osteoporosis without current pathological fracture: Secondary | ICD-10-CM | POA: Diagnosis not present

## 2020-01-11 DIAGNOSIS — Z780 Asymptomatic menopausal state: Secondary | ICD-10-CM | POA: Diagnosis not present

## 2020-01-11 DIAGNOSIS — E2839 Other primary ovarian failure: Secondary | ICD-10-CM

## 2020-01-13 ENCOUNTER — Ambulatory Visit: Payer: Medicare Other | Admitting: Family Medicine

## 2020-01-17 ENCOUNTER — Other Ambulatory Visit: Payer: Self-pay | Admitting: Endocrinology

## 2020-02-07 DIAGNOSIS — R32 Unspecified urinary incontinence: Secondary | ICD-10-CM | POA: Diagnosis not present

## 2020-02-07 DIAGNOSIS — K588 Other irritable bowel syndrome: Secondary | ICD-10-CM | POA: Diagnosis not present

## 2020-02-07 DIAGNOSIS — E1121 Type 2 diabetes mellitus with diabetic nephropathy: Secondary | ICD-10-CM | POA: Diagnosis not present

## 2020-02-07 DIAGNOSIS — M7700 Medial epicondylitis, unspecified elbow: Secondary | ICD-10-CM | POA: Diagnosis not present

## 2020-02-07 DIAGNOSIS — M818 Other osteoporosis without current pathological fracture: Secondary | ICD-10-CM | POA: Diagnosis not present

## 2020-02-07 DIAGNOSIS — E11649 Type 2 diabetes mellitus with hypoglycemia without coma: Secondary | ICD-10-CM | POA: Diagnosis not present

## 2020-02-07 DIAGNOSIS — M47816 Spondylosis without myelopathy or radiculopathy, lumbar region: Secondary | ICD-10-CM | POA: Diagnosis not present

## 2020-02-07 DIAGNOSIS — K5900 Constipation, unspecified: Secondary | ICD-10-CM | POA: Diagnosis not present

## 2020-02-07 DIAGNOSIS — K279 Peptic ulcer, site unspecified, unspecified as acute or chronic, without hemorrhage or perforation: Secondary | ICD-10-CM | POA: Diagnosis not present

## 2020-02-07 DIAGNOSIS — E43 Unspecified severe protein-calorie malnutrition: Secondary | ICD-10-CM | POA: Diagnosis not present

## 2020-02-07 DIAGNOSIS — G43001 Migraine without aura, not intractable, with status migrainosus: Secondary | ICD-10-CM | POA: Diagnosis not present

## 2020-02-07 DIAGNOSIS — G609 Hereditary and idiopathic neuropathy, unspecified: Secondary | ICD-10-CM | POA: Diagnosis not present

## 2020-02-13 ENCOUNTER — Other Ambulatory Visit: Payer: Self-pay | Admitting: Endocrinology

## 2020-02-13 NOTE — Telephone Encounter (Signed)
1.  Please schedule f/u appt 2.  Then please refill x 2 mos, pending that appt.  

## 2020-02-14 ENCOUNTER — Other Ambulatory Visit: Payer: Self-pay | Admitting: *Deleted

## 2020-02-14 NOTE — Telephone Encounter (Signed)
Pt set appt-02/17/20 with the Loanne Drilling. Rx pending.

## 2020-02-17 ENCOUNTER — Other Ambulatory Visit: Payer: Self-pay

## 2020-02-17 ENCOUNTER — Encounter: Payer: Self-pay | Admitting: Endocrinology

## 2020-02-17 ENCOUNTER — Ambulatory Visit (INDEPENDENT_AMBULATORY_CARE_PROVIDER_SITE_OTHER): Payer: Medicare Other | Admitting: Endocrinology

## 2020-02-17 VITALS — BP 108/64 | HR 68 | Ht 65.0 in | Wt 106.0 lb

## 2020-02-17 DIAGNOSIS — E11649 Type 2 diabetes mellitus with hypoglycemia without coma: Secondary | ICD-10-CM

## 2020-02-17 LAB — POCT GLYCOSYLATED HEMOGLOBIN (HGB A1C): Hemoglobin A1C: 4.7 % (ref 4.0–5.6)

## 2020-02-17 MED ORDER — FREESTYLE LIBRE 14 DAY SENSOR MISC
1.0000 | 3 refills | Status: DC
Start: 2020-02-17 — End: 2021-11-28

## 2020-02-17 NOTE — Progress Notes (Signed)
Subjective:    Patient ID: Kayla [name], female    DOB: 1945-09-20, 75 y.o.   MRN: [id number]  HPI Pt returns for f/u of reactive hypoglycemia (she had gastric bypass surgery in 1999 (she weighed 170 prior--she says surg was done for Barrett's esophagus, not obesity), but she reports intermittent hypoglycemia since 1979; no other cause was found; she takes acarbose; edema precludes pioglitazone; she has seen dietician).  I reviewed continuous glucose monitor data.  glucose varies from 75-180  It is in general higher PC than AC. she reports drowsiness, headache, sweating, and tremor, even when cbg is in the 90's.  She now has hypoglycemia approx BID, but these episodes are mild.  She says her dietary efforts are helping.  Past Medical History:  Diagnosis Date  . Allergy   . Anal fissure 10/08/2016  . Anemia    anemia in the past  . Angina    takes Propanolol prn and it stops her chest  . Anxiety   . Barrett esophagus    not consistently present  . Cataract   . Chronic kidney disease    kidney stones and UTI  . Complication of anesthesia    either  BP or pulse dropped with last surgery  . DDD (degenerative disc disease)    cervical and lumbar  . Dementia ([place])   . Depression    post traumatic stress  disorder  . Diabetes ([place]) 11/18/2016  . Diabetes mellitus    sugar goes up and down diet controlled  . Dysrhythmia    brought on by stress  . External hemorrhoids   . Fatigue   . Fibromyalgia    neuropathy knees ankles and toes, bladder  . GERD (gastroesophageal reflux disease)   . H/O hyperparathyroidism   . Headache(784.0)    migraines  . Hiatal hernia   . History of kidney stones   . Hypertension    3 small brain aneurysms  . Hypoglycemia 11/08/2016  . IBS (irritable bowel syndrome)   . Internal hemorrhoids   . Macular degeneration   . Migraines   . Mitral regurgitation 12/11/2015  . Osteoarthritis    all over  . Osteopetrosis   . Peripheral vascular disease  ([facility])    superficial phlebitis in left calf  . Personal history of colonic polyps 07/22/2013   07/2013 - 3 small adenomas - repeat colonoscopy 2018  . Small intestinal bacterial overgrowth 08/05/2017   + lactulose breath test 06/2017 Xifaxan Rx    Past Surgical History:  Procedure Laterality Date  . ABDOMINAL HYSTERECTOMY    . APPENDECTOMY    . CARDIAC CATHETERIZATION  03/22/1991   EF 61%  . CARDIOVASCULAR STRESS TEST  10/03/2006  . CHOLECYSTECTOMY    . COLONOSCOPY  06/23/2008   normal  . DILATION AND CURETTAGE OF UTERUS     x2  . ESOPHAGUS SURGERY    . EXPLORATORY LAPAROTOMY  1993  . EYE SURGERY    . GASTRIC BYPASS  1999  . GASTRIC RESTRICTION SURGERY  1991  . LASIK Bilateral   . Right Arm Surgery    . Right Knee Arthroscopy    . Rt wrist fx  2009  . spinal surgery battery implant    . stomach stappeling  1991  . STONE EXTRACTION WITH BASKET  2012  . TONSILLECTOMY    . TUBAL LIGATION    . UPPER GASTROINTESTINAL ENDOSCOPY  06/23/2008   w/Dil, Barrett's esophagus  . US ECHOCARDIOGRAPHY  01/21/2007   EF  55-60%  . US ECHOCARDIOGRAPHY  08/30/2003   EF 55-60%    Social History   Socioeconomic History  . Marital status: Married    Spouse name: Rushie Chestnut.  . Number of children: 2  . Years of education: Not on file  . Highest education level: Not on file  Occupational History  . Occupation: Disability    Employer: RETIRED  Tobacco Use  . Smoking status: Never Smoker  . Smokeless tobacco: Never Used  Vaping Use  . Vaping Use: Never used  Substance and Sexual Activity  . Alcohol use: No  . Drug use: No  . Sexual activity: Not Currently  Other Topics Concern  . Not on file  Social History Narrative   Right handed   Two story home   She retired on disability   Married   2 Children   Social Determinants of Health   Financial Resource Strain: Not on file  Food Insecurity: Not on file  Transportation Needs: Not on file  Physical Activity: Not on file  Stress: Not on  file  Social Connections: Not on file  Intimate Partner Violence: Not on file    Current Outpatient Medications on File Prior to Visit  Medication Sig Dispense Refill  . acarbose (PRECOSE) 50 MG tablet TAKE 1 TABLET BY MOUTH 3 TIMES A DAY WITH MEALS 270 tablet 1  . aspirin-acetaminophen-caffeine (EXCEDRIN MIGRAINE) 250-250-65 MG tablet Take 2 tablets by mouth 2 (two) times daily.    . Blood Glucose Monitoring Suppl (FREESTYLE FREEDOM LITE) w/Device KIT 1 each by Does not apply route 4 (four) times daily. Use to monitor glucose levels 4 times daily; E11.9 1 kit 0  . clonazePAM (KLONOPIN) 0.5 MG tablet Take 0.25 mg by mouth 2 (two) times daily.    . Continuous Blood Gluc Receiver (FREESTYLE LIBRE 14 DAY READER) DEVI 1 each by Does not apply route See admin instructions. Use with sensors to monitor glucose levels; E11.9 1 each 0  . cyclobenzaprine (FLEXERIL) 10 MG tablet Take 1 tablet (10 mg total) by mouth 3 (three) times daily as needed for muscle spasms. 90 day supply 270 tablet 3  . cycloSPORINE (RESTASIS) 0.05 % ophthalmic emulsion Place 1 drop into both eyes 2 (two) times daily. 5.5 mL 5  . diclofenac sodium (VOLTAREN) 1 % GEL Apply 2 g topically 4 (four) times daily. 700 g 3  . dicyclomine (BENTYL) 10 MG capsule Take 1-2 tab 3 times daily AC as needed for spasms and cramping. 270 capsule 1  . diltiazem 2 % GEL Apply 1 application topically 2 (two) times daily as needed (pain from fissure). 30 g 2  . DULoxetine (CYMBALTA) 30 MG capsule Take 30 mg by mouth 2 (two) times daily.    Marland Kitchen esomeprazole (NEXIUM) 40 MG capsule Take 1 capsule (40 mg total) by mouth daily before breakfast. 90 capsule 3  . fentaNYL (DURAGESIC - DOSED MCG/HR) 50 MCG/HR Place 50 mcg onto the skin every 3 (three) days.    . flurbiprofen (ANSAID) 100 MG tablet Take 1 tablet (100 mg total) by mouth every 8 (eight) hours as needed (maximum 3 tablets in 24 hours). 24 tablet 3  . Galcanezumab-gnlm (EMGALITY) 120 MG/ML SOAJ Inject  120 mg into the skin every 28 (twenty-eight) days. 3 pen 3  . glucagon 1 MG injection Inject 1 mg into the muscle once as needed for up to 1 dose. 1 each 12  . glucose blood (FREESTYLE TEST STRIPS) test strip Use to monitor glucose  levels 4 times daily; E11.9 200 each 0  . HYDROcodone-acetaminophen (NORCO) 10-325 MG tablet Take 1 tablet by mouth every 6 (six) hours as needed for moderate pain.    . hypromellose (GENTEAL) 0.3 % GEL ophthalmic ointment Place 1 drop into both eyes at bedtime. Reported on 02/11/2015    . Lancets (FREESTYLE) lancets Use to monitor glucose levels 4 times daily; E11.9 200 each 0  . lidocaine (LIDODERM) 5 % Place 1 patch onto the skin as needed. Remove & Discard patch within 12 hours. May dispense as 3 month supply 90 patch 3  . Lifitegrast 5 % SOLN Place 1 drop into both eyes daily.     . meloxicam (MOBIC) 15 MG tablet Take 1 tablet (15 mg total) by mouth daily. Prn pain 90 tablet 1  . metroNIDAZOLE (METROGEL) 1 % gel Apply topically daily. 45 g 0  . mirabegron ER (MYRBETRIQ) 25 MG TB24 tablet Take 1 tablet (25 mg total) by mouth daily. 90 tablet 1  . Multiple Vitamins-Minerals (PRESERVISION AREDS) CAPS Take 1 capsule by mouth 2 (two) times daily.     . naloxegol oxalate (MOVANTIK) 25 MG TABS tablet Take 25 mg by mouth daily.    . Naloxone HCl 0.4 MG/0.4ML SOAJ Administer 0.95m [place] at first sign of opioid overdose and repeat every 2 minutes as needed for resuscitation. Call 911 immediately 5 Package 0  . NON FORMULARY [facility]  Peripheral Neuropathy Cream- Bupivacaine 1%, Doxepin 3%, Gabapentin 6%, Pentoxifylline 3%, Topiramate 1% Apply 1-2 grams to affected area 3-4 times daily Qty. 120 gm 3 refills    . ondansetron (ZOFRAN) 8 MG tablet Take 1 tablet (8 mg total) by mouth every 8 (eight) hours as needed for nausea or vomiting. 60 tablet 0  . pregabalin (LYRICA) 50 MG capsule Take 1 capsule (50 mg total) by mouth 2 (two) times daily. 180 capsule 1  . Probiotic  Product (PROBIOTIC-10 PO) Take by mouth.     . sucralfate (CARAFATE) 1 GM/10ML suspension Take 10 mLs (1 g total) by mouth 4 (four) times daily. 420 mL 1  . SUMAtriptan (IMITREX) 100 MG tablet Take 1 tablet at earliest onset of headache, may repeat in 2 hours if headache persists or reoccurs. 30 tablet 1  . Vitamin D, Ergocalciferol, (DRISDOL) 1.25 MG (50000 UT) CAPS capsule Take 1 capsule (50,000 Units total) by mouth every 7 (seven) days. 12 capsule 3   No current facility-administered medications on file prior to visit.    Allergies  Allergen Reactions  . Ambien [Zolpidem Tartrate]     Hallucinations  . Codeine Anaphylaxis  . Oxycodone-Acetaminophen Shortness Of Breath  . Tylox [Oxycodone-Acetaminophen] Anaphylaxis    Chest pain  . Darvocet [Propoxyphene N-Acetaminophen]     Chest pain  . Darvon [Propoxyphene] Itching  . Lorcet [Hydrocodone-Acetaminophen]     Chest pain  . Opium     hallucinations  . Vicodin [Hydrocodone-Acetaminophen] Other (See Comments)    Chest Pain   . Wheat Bran   . Amoxicillin Nausea And Vomiting    Reaction:  Migraine headache  . Penicillins Nausea And Vomiting    Migraine Headaches    Family History  Problem Relation Age of Onset  . Stroke Mother   . Lung cancer Mother   . Heart disease Mother   . Diabetes Mother   . Hypertension Father   . Stroke Father   . Lung cancer Father   . Heart disease Father   . Kidney disease Father   .  Seizures Father        epilepsy, and sisters x 2  . Pancreatic cancer Sister   . Lung cancer Sister   . Colon cancer Maternal Aunt        12 relatives  . Uterine cancer Other        aunt  . Heart disease Other        grandmother  . Clotting disorder Sister   . Heart disease Sister        x 2  . Kidney disease Sister        x 2  . Breast cancer Sister   . Colon cancer Paternal Aunt   . Colon cancer Paternal Aunt   . Esophageal cancer Neg Hx   . Rectal cancer Neg Hx   . Stomach cancer Neg Hx     BP  108/64   Pulse 68   Ht '5\' 5"'  (1.651 m)   Wt 106 lb (48.1 kg)   SpO2 98%   BMI 17.64 kg/m    Review of Systems Denies diarrhea.      Objective:   Physical Exam VITAL SIGNS:  See vs page GENERAL: no distress Pulses: dorsalis pedis intact bilat.   MSK: no deformity of the feet CV: no leg edema Skin:  no ulcer on the feet.  normal color and temp on the feet. Neuro: sensation is intact to touch on the feet.   Lab Results  Component Value Date   HGBA1C 4.7 02/17/2020       Assessment & Plan:  Reactive hypoglycemia: uncontrolled. We discussed. We agree we are doing the best with meds that we can.  She'll continue to work on diet.   Drowsiness is due to meds, not hypoglycemia.    Patient Instructions  Please continue the same acarbose.   Please come back for a follow-up appointment in 6 months.

## 2020-02-17 NOTE — Patient Instructions (Addendum)
Please continue the same acarbose.   Please come back for a follow-up appointment in 6 months.

## 2020-02-22 ENCOUNTER — Telehealth: Payer: Self-pay | Admitting: Gastroenterology

## 2020-02-22 NOTE — Telephone Encounter (Signed)
Pls call pt, she needs to r/s ct scan that she missed last November.

## 2020-02-22 NOTE — Telephone Encounter (Signed)
The pt called because she cancelled her appt last year for CT scan that [facility] ordered.  She would like to see Dr Carlean Purl to discuss her continued abd pain.  She has been scheduled for 3/18 with Dr Carlean Purl.

## 2020-02-29 ENCOUNTER — Encounter: Payer: Medicare Other | Admitting: Family Medicine

## 2020-02-29 ENCOUNTER — Other Ambulatory Visit: Payer: Self-pay

## 2020-02-29 NOTE — Progress Notes (Signed)
This encounter was created in error - please disregard.

## 2020-03-02 DIAGNOSIS — J019 Acute sinusitis, unspecified: Secondary | ICD-10-CM | POA: Diagnosis not present

## 2020-03-02 DIAGNOSIS — G43001 Migraine without aura, not intractable, with status migrainosus: Secondary | ICD-10-CM | POA: Diagnosis not present

## 2020-03-02 DIAGNOSIS — Z87442 Personal history of urinary calculi: Secondary | ICD-10-CM | POA: Diagnosis not present

## 2020-03-02 DIAGNOSIS — K588 Other irritable bowel syndrome: Secondary | ICD-10-CM | POA: Diagnosis not present

## 2020-03-02 DIAGNOSIS — G609 Hereditary and idiopathic neuropathy, unspecified: Secondary | ICD-10-CM | POA: Diagnosis not present

## 2020-03-02 DIAGNOSIS — K279 Peptic ulcer, site unspecified, unspecified as acute or chronic, without hemorrhage or perforation: Secondary | ICD-10-CM | POA: Diagnosis not present

## 2020-03-02 DIAGNOSIS — R3129 Other microscopic hematuria: Secondary | ICD-10-CM | POA: Diagnosis not present

## 2020-03-02 DIAGNOSIS — E1121 Type 2 diabetes mellitus with diabetic nephropathy: Secondary | ICD-10-CM | POA: Diagnosis not present

## 2020-03-02 DIAGNOSIS — K22719 Barrett's esophagus with dysplasia, unspecified: Secondary | ICD-10-CM | POA: Diagnosis not present

## 2020-03-02 DIAGNOSIS — M47816 Spondylosis without myelopathy or radiculopathy, lumbar region: Secondary | ICD-10-CM | POA: Diagnosis not present

## 2020-03-02 DIAGNOSIS — H04123 Dry eye syndrome of bilateral lacrimal glands: Secondary | ICD-10-CM | POA: Diagnosis not present

## 2020-03-02 DIAGNOSIS — H0100A Unspecified blepharitis right eye, upper and lower eyelids: Secondary | ICD-10-CM | POA: Diagnosis not present

## 2020-03-02 DIAGNOSIS — E43 Unspecified severe protein-calorie malnutrition: Secondary | ICD-10-CM | POA: Diagnosis not present

## 2020-03-02 DIAGNOSIS — H02052 Trichiasis without entropian right lower eyelid: Secondary | ICD-10-CM | POA: Diagnosis not present

## 2020-03-02 DIAGNOSIS — H40023 Open angle with borderline findings, high risk, bilateral: Secondary | ICD-10-CM | POA: Diagnosis not present

## 2020-03-02 DIAGNOSIS — R109 Unspecified abdominal pain: Secondary | ICD-10-CM | POA: Diagnosis not present

## 2020-03-02 DIAGNOSIS — R32 Unspecified urinary incontinence: Secondary | ICD-10-CM | POA: Diagnosis not present

## 2020-03-02 DIAGNOSIS — H16223 Keratoconjunctivitis sicca, not specified as Sjogren's, bilateral: Secondary | ICD-10-CM | POA: Diagnosis not present

## 2020-03-02 DIAGNOSIS — K5900 Constipation, unspecified: Secondary | ICD-10-CM | POA: Diagnosis not present

## 2020-03-02 DIAGNOSIS — E11649 Type 2 diabetes mellitus with hypoglycemia without coma: Secondary | ICD-10-CM | POA: Diagnosis not present

## 2020-03-02 NOTE — Telephone Encounter (Signed)
Pt is requesting labs to be ordered first prior to doing her CT scan

## 2020-03-03 NOTE — Telephone Encounter (Signed)
Left message on pt voice mail that she needs to keep her appt with Dr Carlean Purl to discuss any labs that may be needed.

## 2020-03-07 DIAGNOSIS — R1084 Generalized abdominal pain: Secondary | ICD-10-CM | POA: Diagnosis not present

## 2020-03-07 DIAGNOSIS — R1032 Left lower quadrant pain: Secondary | ICD-10-CM | POA: Diagnosis not present

## 2020-03-07 DIAGNOSIS — Z87442 Personal history of urinary calculi: Secondary | ICD-10-CM | POA: Diagnosis not present

## 2020-03-08 ENCOUNTER — Other Ambulatory Visit: Payer: Self-pay | Admitting: Family Medicine

## 2020-03-08 DIAGNOSIS — R109 Unspecified abdominal pain: Secondary | ICD-10-CM

## 2020-03-08 DIAGNOSIS — M549 Dorsalgia, unspecified: Secondary | ICD-10-CM

## 2020-03-13 DIAGNOSIS — D510 Vitamin B12 deficiency anemia due to intrinsic factor deficiency: Secondary | ICD-10-CM | POA: Diagnosis not present

## 2020-03-13 DIAGNOSIS — Z832 Family history of diseases of the blood and blood-forming organs and certain disorders involving the immune mechanism: Secondary | ICD-10-CM | POA: Diagnosis not present

## 2020-03-14 ENCOUNTER — Ambulatory Visit (HOSPITAL_COMMUNITY): Payer: Medicare Other

## 2020-03-14 NOTE — Telephone Encounter (Signed)
The pt states she forgot about the CT scan and will keep appt with Dr Carlean Purl on 3/18.

## 2020-03-14 NOTE — Telephone Encounter (Signed)
Received a call from CT that pt no showed her appt today.  This is the 2nd appt she has no showed for CT scans -please advise

## 2020-03-14 NOTE — Telephone Encounter (Signed)
Noted.  Will you touch base with her and see if she plans to have it done at any point?  I guess get an update on her symptoms.  Have not seen her since October.  Thank you,  Jess

## 2020-03-14 NOTE — Progress Notes (Signed)
NEUROLOGY FOLLOW UP OFFICE NOTE  Kayla [name] [id number]  Assessment/Plan:   1.  Migraine without aura, without status migrainosus, not intractable 2.  Idiopathic polyneuropathy 3.  Chronic pain syndrome  1.  Migraine prevention:  Emgality 2.  Migraine rescue:  Sumatriptan  3.  Chronic pain management:  Lyrica 73m BID (Cymbalta as prescribed by another provider) 4.  Limit use of pain relievers to no more than 2 days out of week to prevent risk of rebound or medication-overuse headache. 5.  Keep headache diary 6.  Follow up 6 months  Subjective:  Kayla [name] a 89ear old womanwith fibromyalgia, type 2 diabetes, anxiety with history of PTSD, degenerative disc disease of cervical and lumbar spine, and history of nephrolithiasis and gastric bypass who follows up for polyneuropathy and migraine.  UPDATE: Chronic pain: Prescribed Lyrica by me and Cymbalta by another provider. The spinal nerve stimulator is ineffective and actually causes her more discomfort.  She may have it removed.  She was tested for RA given her polyarthralgia including hands, wrist and elbows.    Migraine: No severe migraines.  Has a mild to moderate daily headache Current NSAIDS:flurbiprofen;Celebrex Current analgesics:Excedrin Migraine,Lidoderm, voltaren 1%, Fentanyl patch Current triptans:Sumatriptan 1060mCurrent ergotamine:no Current anti-emetic:no Current muscle relaxants:none Current anti-anxiolytic:clonazepam Current sleep aide:trazodone Current Antihypertensive medications:Lasix Current Antidepressant medications:Cymbalta9017mwice daily, Lyrica 13m103mice daily(three times daily caused confusion), trazodone Current Anticonvulsant medications:No Current anti-CGRP:Emgality Current Vitamins/Herbal/Supplements:turmeric Current Antihistamines/Decongestants:no Other therapy:Physical therapy, [facility]  Caffeine:2 cups of coffee  daily Alcohol:no Smoker:no Diet:poor Exercise:no Depression:no; Anxiety:yes Sleep hygiene:poor  HISTORY: I Neuropathy: Since July 2018, she has had increased swelling and burning numbness and tingling in the legs. She had lower extremity vascular ABIs on 08/20/16, which was negative. She has history of B12 deficiency but she takes injections and recent B12 level from 09/21/16 was over 2000. Methylmalonic acid level was 209, RPR nonreactive, homocysteine 7.8. Labs from 07/12/16 include normal SPEP, Sed Rate 2, CRP less than 0.3, and TSH 2.430 . Vitamin D was 23 and she was advised to start supplementation. Serum glucose has been 70s up to 110. She also takes B6. She also has history of weight loss. She has fibromyalgia and was previously diagnosed with neuropathy by another neurologist. She has a spinal cord stimulator.  She has been on gabapentin for many years. It was briefly discontinued to see if it was contributing to the swelling. She reported no significant change, so it was restarted (she takes 300mg6mimes daily), although she thinks it helped a little bit. NCV-EMG from 10/21/16 demonstrated chronic sensorimotor polyneuropathy of the predominantly axonal type as well as mild left median neuropathy at or distal to the wrist. Other labs include B1 146.1, B6 10.7, Sed Rate 2, HIV negative, TSH 1.780, UPEP negative. [facility]Selen[facility], Zinc, paraneoplastic panel and genetic testing Copper was low, so she was advised to start supplement.B6 was still elevated despite stopping supplements 3prior.    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 Her husband was a [place] and reports cases of spouses exposed to [employer]itiesugh their husband's semen. 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 Her neuropathic symptoms started shortly after her return.    II  Migraine: Onset: Since 1970, after her twin newborns passed away. She has lived with life-long family-related stress Location: Migraines are unilateral/parietal or bilateral retro-orbital, chronic tension type (bifrontal) Quality: Severe crushing Initial Intensity: 10/10  Aura: no Prodrome: no Associated symptoms: Photophobia, phonophobia. No nausea, vomiting or visual disturbance Initial Duration: Migraines 1 hour with sumatriptan, other headaches 15 minutes with Fioricet Initial Frequency: Daily (3 to 4 days a month are severe migraine) Triggers/exacerbating factors: Stress, Nexium Relieving factors: Clonazepam, Fioricet, sumatriptan Activity: Cannot function when severe (3-4 days per month)  Past NSAIDS: Ibuprofen, naproxen Past analgesics: Cafergot, Excedrin, Tylenol, Tramadol, Fioricet Past abortive triptans: no Past muscle relaxants:tizanidine, Flexeril Past anti-emetic: Zofran ODT 73m Past antihypertensive medications: Lopressor, propranolol Past antidepressant medications: Elavil, Effexor, Zoloft Past anticonvulsant medications: Topiramate, gabapentin 3067mtwice daily Past CGRP-inhibitor:  Aimovig 14076mast vitamins/Herbal/Supplements:CoQ10 Other past therapies: yoga  Family history of headache: Father, sister, brother  MRI and MRA of brain from 07/23/11 were personally reviewed and unremarkable.  PAST MEDICAL HISTORY: Past Medical History:  Diagnosis Date  . Allergy   . Anal fissure 10/08/2016  . Anemia    anemia in the past  . Angina    takes Propanolol prn and it stops her chest  . Anxiety   . Barrett esophagus    not consistently present  . Cataract   . Chronic kidney disease    kidney stones and UTI  . Complication of anesthesia    either  BP or pulse dropped with last surgery  . DDD (degenerative disc disease)    cervical and lumbar  . D[place] ([place] Depression    post traumatic stress  disorder  . Diabetes ([place]  . Diabetes mellitus    sugar goes up and down diet controlled  . Dysrhythmia    brought on by stress  . External hemorrhoids   . Fatigue   . Fibromyalgia    neuropathy knees ankles and toes, bladder  . GERD (gastroesophageal reflux disease)   . H/O hyperparathyroidism   . Headache(784.0)    migraines  . Hiatal hernia   . History of kidney stones   . Hypertension    3 small brain aneurysms  . Hypoglycemia 11/08/2016  . IBS (irritable bowel syndrome)   . Internal hemorrhoids   . Macular degeneration   . Migraines   . Mitral regurgitation 12/11/2015  . Osteoarthritis    all over  . Osteopetrosis   . Peripheral vascular disease (HCC)    superficial phlebitis in left calf  . Personal history of colonic polyps 07/22/2013   07/2013 - 3 small adenomas - repeat colonoscopy 2018  . Small intestinal bacterial overgrowth 08/05/2017   + lactulose breath test 06/2017 Xifaxan Rx    MEDICATIONS: Current Outpatient Medications on File Prior to Visit  Medication Sig Dispense Refill  . acarbose (PRECOSE) 50 MG tablet TAKE 1 TABLET BY MOUTH 3 TIMES A DAY WITH MEALS 270 tablet 1  . aspirin-acetaminophen-caffeine (EXCEDRIN MIGRAINE) 250-250-65 MG tablet Take 2 tablets by mouth 2 (two) times daily.    . Blood Glucose Monitoring Suppl (FREESTYLE FREEDOM LITE) w/Device KIT 1 each by Does not apply route 4 (four) times daily. Use to monitor glucose levels 4 times daily; E11.9 1 kit 0  . clonazePAM (KLONOPIN) 0.5 MG tablet Take 0.25 mg by mouth 2 (two) times daily.    . Continuous Blood Gluc Receiver (FREESTYLE LIBRE 14 DAY READER) DEVI 1 each by Does not apply route See admin instructions. Use with sensors to monitor glucose levels; E11.9 1 each 0  . Continuous Blood Gluc Sensor (FREESTYLE LIBRE 14 DAY SENSOR) MISC 1 Device by Does not apply route every 14 (fourteen) days. 6  each 3  . cyclobenzaprine (FLEXERIL) 10 MG tablet Take 1 tablet (10 mg total) by mouth 3 (three) times daily as needed  for muscle spasms. 90 day supply 270 tablet 3  . cycloSPORINE (RESTASIS) 0.05 % ophthalmic emulsion Place 1 drop into both eyes 2 (two) times daily. 5.5 mL 5  . diclofenac sodium (VOLTAREN) 1 % GEL Apply 2 g topically 4 (four) times daily. 700 g 3  . dicyclomine (BENTYL) 10 MG capsule Take 1-2 tab 3 times daily AC as needed for spasms and cramping. 270 capsule 1  . diltiazem 2 % GEL Apply 1 application topically 2 (two) times daily as needed (pain from fissure). 30 g 2  . DULoxetine (CYMBALTA) 30 MG capsule Take 30 mg by mouth 2 (two) times daily.    Marland Kitchen esomeprazole (NEXIUM) 40 MG capsule Take 1 capsule (40 mg total) by mouth daily before breakfast. 90 capsule 3  . fentaNYL (DURAGESIC - DOSED MCG/HR) 50 MCG/HR Place 50 mcg onto the skin every 3 (three) days.    . flurbiprofen (ANSAID) 100 MG tablet Take 1 tablet (100 mg total) by mouth every 8 (eight) hours as needed (maximum 3 tablets in 24 hours). 24 tablet 3  . Galcanezumab-gnlm (EMGALITY) 120 MG/ML SOAJ Inject 120 mg into the skin every 28 (twenty-eight) days. 3 pen 3  . glucagon 1 MG injection Inject 1 mg into the muscle once as needed for up to 1 dose. 1 each 12  . glucose blood (FREESTYLE TEST STRIPS) test strip Use to monitor glucose levels 4 times daily; E11.9 200 each 0  . HYDROcodone-acetaminophen (NORCO) 10-325 MG tablet Take 1 tablet by mouth every 6 (six) hours as needed for moderate pain.    . hypromellose (GENTEAL) 0.3 % GEL ophthalmic ointment Place 1 drop into both eyes at bedtime. Reported on 02/11/2015    . Lancets (FREESTYLE) lancets Use to monitor glucose levels 4 times daily; E11.9 200 each 0  . lidocaine (LIDODERM) 5 % Place 1 patch onto the skin as needed. Remove & Discard patch within 12 hours. May dispense as 3 month supply 90 patch 3  . Lifitegrast 5 % SOLN Place 1 drop into both eyes daily.     . meloxicam (MOBIC) 15 MG tablet Take 1 tablet (15 mg total) by mouth daily. Prn pain 90 tablet 1  . metroNIDAZOLE (METROGEL) 1 %  gel Apply topically daily. 45 g 0  . mirabegron ER (MYRBETRIQ) 25 MG TB24 tablet Take 1 tablet (25 mg total) by mouth daily. 90 tablet 1  . Multiple Vitamins-Minerals (PRESERVISION AREDS) CAPS Take 1 capsule by mouth 2 (two) times daily.     . naloxegol oxalate (MOVANTIK) 25 MG TABS tablet Take 25 mg by mouth daily.    . Naloxone HCl 0.4 MG/0.4ML SOAJ Administer 0.23m [place] at first sign of opioid overdose and repeat every 2 minutes as needed for resuscitation. Call 911 immediately 5 Package 0  . NON FORMULARY [facility]  Peripheral Neuropathy Cream- Bupivacaine 1%, Doxepin 3%, Gabapentin 6%, Pentoxifylline 3%, Topiramate 1% Apply 1-2 grams to affected area 3-4 times daily Qty. 120 gm 3 refills    . ondansetron (ZOFRAN) 8 MG tablet Take 1 tablet (8 mg total) by mouth every 8 (eight) hours as needed for nausea or vomiting. 60 tablet 0  . pregabalin (LYRICA) 50 MG capsule Take 1 capsule (50 mg total) by mouth 2 (two) times daily. 180 capsule 1  . Probiotic Product (PROBIOTIC-10 PO) Take by mouth.     .Marland Kitchen  sucralfate (CARAFATE) 1 GM/10ML suspension Take 10 mLs (1 g total) by mouth 4 (four) times daily. 420 mL 1  . SUMAtriptan (IMITREX) 100 MG tablet Take 1 tablet at earliest onset of headache, may repeat in 2 hours if headache persists or reoccurs. 30 tablet 1  . Vitamin D, Ergocalciferol, (DRISDOL) 1.25 MG (50000 UT) CAPS capsule Take 1 capsule (50,000 Units total) by mouth every 7 (seven) days. 12 capsule 3   No current facility-administered medications on file prior to visit.    ALLERGIES: Allergies  Allergen Reactions  . Ambien [Zolpidem Tartrate]     Hallucinations  . Codeine Anaphylaxis  . Oxycodone-Acetaminophen Shortness Of Breath  . Tylox [Oxycodone-Acetaminophen] Anaphylaxis    Chest pain  . Darvocet [Propoxyphene N-Acetaminophen]     Chest pain  . Darvon [Propoxyphene] Itching  . Lorcet [Hydrocodone-Acetaminophen]     Chest pain  . Opium     hallucinations  . Vicodin  [Hydrocodone-Acetaminophen] Other (See Comments)    Chest Pain   . Wheat Bran   . Amoxicillin Nausea And Vomiting    Reaction:  Migraine headache  . Penicillins Nausea And Vomiting    Migraine Headaches    FAMILY HISTORY: Family History  Problem Relation Age of Onset  . Stroke Mother   . Lung cancer Mother   . Heart disease Mother   . Diabetes Mother   . Hypertension Father   . Stroke Father   . Lung cancer Father   . Heart disease Father   . Kidney disease Father   . Seizures Father        epilepsy, and sisters x 2  . Pancreatic cancer Sister   . Lung cancer Sister   . Colon cancer Maternal Aunt        12 relatives  . Uterine cancer Other        aunt  . Heart disease Other        grandmother  . Clotting disorder Sister   . Heart disease Sister        x 2  . Kidney disease Sister        x 2  . Breast cancer Sister   . Colon cancer Paternal Aunt   . Colon cancer Paternal Aunt   . Esophageal cancer Neg Hx   . Rectal cancer Neg Hx   . Stomach cancer Neg Hx    Objective:  Blood pressure 107/65, pulse 98, height '5\' 5"'  (1.651 m), weight 107 lb 12.8 oz (48.9 kg), SpO2 98 %. General: No acute distress.  Patient appears well-groomed.   Head:  Normocephalic/atraumatic Eyes:  Fundi examined but not visualized Neck: supple, no paraspinal tenderness, full range of motion Heart:  Regular rate and rhythm Lungs:  Clear to auscultation bilaterally Back: No paraspinal tenderness Neurological Exam: alert and oriented to person, place, and time. Attention span and concentration intact, recent and remote memory intact, fund of knowledge intact.  Speech fluent and not dysarthric, language intact.  CN II-XII intact. Bulk and tone normal, muscle strength 5-/5 left EHL, otherwise 5/5 throughout.  Sensation to pinprick sensation reduced up to ankles; sensation to vibration reduced up to below knees.  Deep tendon reflexes absent throughout, toes downgoing.  Finger to nose intact.  Cautious  wide-based gait.  Romberg positive.     Metta Clines, DO  CC: [name], MD

## 2020-03-14 NOTE — Telephone Encounter (Signed)
Tedra Coupe from [facility] is wanting to inform the nurse the pt was a no show for her CT scan today.

## 2020-03-15 ENCOUNTER — Other Ambulatory Visit: Payer: Self-pay

## 2020-03-15 ENCOUNTER — Encounter: Payer: Self-pay | Admitting: Neurology

## 2020-03-15 ENCOUNTER — Ambulatory Visit (INDEPENDENT_AMBULATORY_CARE_PROVIDER_SITE_OTHER): Payer: Medicare Other | Admitting: Neurology

## 2020-03-15 VITALS — BP 107/65 | HR 98 | Ht 65.0 in | Wt 107.8 lb

## 2020-03-15 DIAGNOSIS — G894 Chronic pain syndrome: Secondary | ICD-10-CM | POA: Diagnosis not present

## 2020-03-15 DIAGNOSIS — G6289 Other specified polyneuropathies: Secondary | ICD-10-CM | POA: Diagnosis not present

## 2020-03-15 DIAGNOSIS — G43009 Migraine without aura, not intractable, without status migrainosus: Secondary | ICD-10-CM

## 2020-03-16 NOTE — Progress Notes (Signed)
Telehealth Encounter  (Email link to [email or web address] ) Patient has completed 2nd dose of COVID-19 vaccine 04/20/19. Endocrinologist, Renato Shin, MD ([place]) Gastroenterologist Silvano Rusk, MD Lives with Husband Clair Gulling & 26-YO grandson I connected with Kayla [name] (MRN [id number]) on 03/20/2020 by MyChart video-enabled, HIPAA-compliant telemedicine application, verified that I was speaking with the correct person using two identifiers, and that the patient was in a private environment conducive to confidentiality.  The patient agreed to proceed.  Persons participating in visit were patient and provider (registered dietitian) Kennith Center, PhD, RD, LDN, CEDRD.  Provider was located at [place] during this telehealth encounter; patient was at home.  Appt start time: 1100 end time: 1200 (1 hour)  Reason for telehealth visit: Kayla [name] was referred by [name], MD for medical nutrition therapy related to DM2 w/ hypoglyc w/out coma (E11.649), severe protein-calorie malnutrition (E43; BMI 17), and Barrett's esophag w/ dysplasia (K22.719).    Relevant history/background: Kayla [name] said she had Roux-n-Y surgery for Barrett's esophagus in 1999.  Said she's had "eating problems" ever since that surgery.  Office notes from Dr. Brigitte Pulse indicate that Kayla [name] fluctuates between restricting & bingeing. Sometimes needs to throw up b/c feels she is choking. Often nauseated when eats.  Significant PMH: Kayla [name] has weighed as much as 230 lb 3 different times, and for many years engaged in some pretty extreme weight loss attempts, including keto diet, amphetamines, and HCG injections.    Assessment: Kayla [name] has been eating more often, with more carb in her diet, i.e., baloney or PB sandwich in mid-afternoon.   FBG: Usually 60s to 70s per CGM.  Sometimes in 29s.  Occasionally eats small snack 1-4 AM if BG is low, e.g., 1 bite banana & 10-12 peanuts.  Usually falls back to  sleep, so doesn't know peak BG achieved.  Starts feeling hypoglycemia symptoms in 80s.    NOTE: Only feels ok if BG 90-98.  (This range has tightened from 90-105 at last appt in Dec.)  When BG >99, symptoms include trembling and heart "pounding."  Weight: Stable at 95-100 lb (home scale).   Usual eating pattern: 3 meals and 3 snacks per day.  Usual physical activity: none currently; peripheral neuropathy limits mobility. Sleep: Bedtime 11:30 to 2 AM; gets up 7 or 8 AM.  Wakes up hourly, but goes back to sleep (with help of trazadone).   24-hr recall:  (Up at 7:26 AM; BG was 79; 66 at 8:03 AM) B (8 AM)-  1 bite banana, 2 Lance crackers (BG 64 at 8:10) 1 egg, 1/2 c hash browns in olive oil,1 c coffee, 1 tsp [place] (BG 90 at 8:34; 108 at 8:53; 74 at 9:46)   250 kcal Snk ( AM)-  ---     300 kcal L (12:15 PM)-  2 c homemade veg-beef soup (incl'd potatoes), water; BG 185 at 12:40  Snk ( PM)-  ---     510 kcal D ( PM)-  Leanne Chang meatloaf&potato dinner (28 g carb), 1/2 c canned veg salad (grn beans, peas, carrots, Ital drsg).  (BG 180 at 8:08; 134 at 8:27; 98 at 8:52; 90 at 9:10; 93 at 9:36) Snk ( PM)-  In too much pain for snack; just took Trazodone and went to sleep.   Typical day? Yes.   Gets a protein drink ~3 X wk.  Eats beans ~3 X wk.    Nutritional Diagnosis: Marginal progress on NI-1.4 Inadequate energy intake as related  to energy needs as evidenced by food recall indicating intake of 1060 kcal.  Intervention: Reviewed diet and exercise history, and provided modified dietary goal.   For recommendations and goals, see Patient Instructions.    Follow-up: Recommended that Kayla [name] see a certified diabetes care and education specialist ([place]) at [place].    SYKES,JEANNIE

## 2020-03-20 ENCOUNTER — Other Ambulatory Visit: Payer: Self-pay

## 2020-03-20 ENCOUNTER — Ambulatory Visit (INDEPENDENT_AMBULATORY_CARE_PROVIDER_SITE_OTHER): Payer: Medicare Other | Admitting: Family Medicine

## 2020-03-20 DIAGNOSIS — E11649 Type 2 diabetes mellitus with hypoglycemia without coma: Secondary | ICD-10-CM

## 2020-03-20 DIAGNOSIS — E43 Unspecified severe protein-calorie malnutrition: Secondary | ICD-10-CM | POA: Diagnosis not present

## 2020-03-20 NOTE — Patient Instructions (Signed)
Goal: Each time you eat, include at least a source of protein along with a meaningful amount of carbohydrate, e.g., at least 1 portion (15 grams carb) of a starchy food).  A good snack, for example, is yogurt.  For meals, you need at least 3 oz of meat or fish.  (Peanut butter: at least 3 tbsp.)  Remember that 30 grams of carbohydrate you eat can affect your blood glucose (BG) differently depending on what it is consumed with.  Including protein, fat, and fiber will slow down digestion, and likely lead to a slower BG rise and more sustained BG level over time.    CONSISTENCY is very helpful as your body works to adapt to a normal amount of food. Eat breakfast within the first hour you get up, and small amounts thru the day.    - Set phone alarms for eating reminders, including for a daily protein drink at bedtime.  (Note: a more economical way to use protein powder is to use a half-dose mixed in 8-10 oz of milk, which provides 1 gram of protein per ounce.  You may want to get a blender bottle that includes a wire blender ball to dissolve the powder well.)    Reiterated from last appt:  When glucose is rising, eat some protein to try stabilize it rather than wait till it crashes.  You will feel better if you can avoid a blood glucose roller coaster.    Follow-up with certified diabetes educator at [place] [facility] [phone]).  I will contact Dr. Brigitte Pulse to ask her to make a referral.

## 2020-03-23 DIAGNOSIS — N3946 Mixed incontinence: Secondary | ICD-10-CM | POA: Diagnosis not present

## 2020-03-23 DIAGNOSIS — R350 Frequency of micturition: Secondary | ICD-10-CM | POA: Diagnosis not present

## 2020-03-31 ENCOUNTER — Ambulatory Visit (INDEPENDENT_AMBULATORY_CARE_PROVIDER_SITE_OTHER): Payer: Medicare Other | Admitting: Internal Medicine

## 2020-03-31 ENCOUNTER — Encounter: Payer: Self-pay | Admitting: Internal Medicine

## 2020-03-31 VITALS — BP 90/62 | HR 92 | Ht 64.75 in | Wt 107.0 lb

## 2020-03-31 DIAGNOSIS — N3941 Urge incontinence: Secondary | ICD-10-CM | POA: Diagnosis not present

## 2020-03-31 DIAGNOSIS — R350 Frequency of micturition: Secondary | ICD-10-CM | POA: Diagnosis not present

## 2020-03-31 DIAGNOSIS — Z86010 Personal history of colonic polyps: Secondary | ICD-10-CM | POA: Diagnosis not present

## 2020-03-31 DIAGNOSIS — R1319 Other dysphagia: Secondary | ICD-10-CM

## 2020-03-31 MED ORDER — PLENVU 140 G PO SOLR: 1.0000 | ORAL | 0 refills | Status: DC

## 2020-03-31 NOTE — Patient Instructions (Signed)
You have been scheduled for an endoscopy and colonoscopy. Please follow the written instructions given to you at your visit today. Please pick up your prep supplies at the pharmacy within the next 1-3 days. If you use inhalers (even only as needed), please bring them with you on the day of your procedure.   Due to recent changes in healthcare laws, you may see the results of your imaging and laboratory studies on MyChart before your provider has had a chance to review them.  We understand that in some cases there may be results that are confusing or concerning to you. Not all laboratory results come back in the same time frame and the provider may be waiting for multiple results in order to interpret others.  Please give us 48 hours in order for your provider to thoroughly review all the results before contacting the office for clarification of your results.    I appreciate the opportunity to care for you. Carl Gessner, MD, FACG 

## 2020-03-31 NOTE — Progress Notes (Signed)
Kayla [name] 75 y.o. 13-Oct-1945 [id number]  Assessment & Plan:   Encounter Diagnoses  Name Primary?  . Esophageal dysphagia Yes  . Hx of adenomatous colonic polyps    EGD and possible esophageal dilation Colonoscopy  The risks and benefits as well as alternatives of endoscopic procedure(s) have been discussed and reviewed. All questions answered. The patient agrees to proceed.  I appreciate the opportunity to care for this patient. RC:BULA, Laurey Arrow, MD    Subjective:   Chief Complaint: dysphagia  HPI Kayla [name] is a 75 yo ww w/ hx of dysphagia, bariatric surgery, adenomatous colon polyps, SIBO, Barrett's esophagus, fibromyalgia and IBS. She c/o recurrent dysphagia -  EGD and Maloney dilation 31 Fr 2020 w/ benefit x 1 year. Now w/ recurrent solid food dysphagia.  Hx adenomatous colon polyps - last 2017 at [facility] w/ adenoma.  Wt Readings from Last 3 Encounters:  03/31/20 107 lb (48.5 kg)  03/15/20 107 lb 12.8 oz (48.9 kg)  02/17/20 106 lb (48.1 kg)    Allergies  Allergen Reactions  . Ambien [Zolpidem Tartrate]     Hallucinations  . Codeine Anaphylaxis  . Oxycodone-Acetaminophen Shortness Of Breath  . Tylox [Oxycodone-Acetaminophen] Anaphylaxis    Chest pain  . Darvocet [Propoxyphene N-Acetaminophen]     Chest pain  . Darvon [Propoxyphene] Itching  . Lorcet [Hydrocodone-Acetaminophen]     Chest pain  . Opium     hallucinations  . Vicodin [Hydrocodone-Acetaminophen] Other (See Comments)    Chest Pain   . Wheat Bran   . Amoxicillin Nausea And Vomiting    Reaction:  Migraine headache  . Penicillins Nausea And Vomiting    Migraine Headaches   Current Meds  Medication Sig  . acarbose (PRECOSE) 50 MG tablet TAKE 1 TABLET BY MOUTH 3 TIMES A DAY WITH MEALS  . aspirin-acetaminophen-caffeine (EXCEDRIN MIGRAINE) 250-250-65 MG tablet Take 2 tablets by mouth 2 (two) times daily.  . Blood Glucose Monitoring Suppl (FREESTYLE FREEDOM LITE) w/Device KIT 1 each by Does not  apply route 4 (four) times daily. Use to monitor glucose levels 4 times daily; E11.9  . celecoxib (CELEBREX) 200 MG capsule Take 200 mg by mouth 2 (two) times daily.  . clonazePAM (KLONOPIN) 0.5 MG tablet Take 0.25 mg by mouth 2 (two) times daily.  . Continuous Blood Gluc Receiver (FREESTYLE LIBRE 14 DAY READER) DEVI 1 each by Does not apply route See admin instructions. Use with sensors to monitor glucose levels; E11.9  . Continuous Blood Gluc Sensor (FREESTYLE LIBRE 14 DAY SENSOR) MISC 1 Device by Does not apply route every 14 (fourteen) days.  . cyclobenzaprine (FLEXERIL) 10 MG tablet Take 1 tablet (10 mg total) by mouth 3 (three) times daily as needed for muscle spasms. 90 day supply  . cycloSPORINE (RESTASIS) 0.05 % ophthalmic emulsion Place 1 drop into both eyes 2 (two) times daily.  . diclofenac sodium (VOLTAREN) 1 % GEL Apply 2 g topically 4 (four) times daily.  Marland Kitchen dicyclomine (BENTYL) 10 MG capsule Take 1-2 tab 3 times daily AC as needed for spasms and cramping.  . DULoxetine (CYMBALTA) 30 MG capsule Take 30 mg by mouth 2 (two) times daily.  Marland Kitchen esomeprazole (NEXIUM) 40 MG capsule Take 1 capsule (40 mg total) by mouth daily before breakfast.  . fentaNYL (DURAGESIC) 25 MCG/HR 1 patch every 3 (three) days.  . flurbiprofen (ANSAID) 100 MG tablet Take 1 tablet (100 mg total) by mouth every 8 (eight) hours as needed (maximum 3 tablets in 24 hours).  Marland Kitchen  Galcanezumab-gnlm (EMGALITY) 120 MG/ML SOAJ Inject 120 mg into the skin every 28 (twenty-eight) days.  Marland Kitchen glucagon 1 MG injection Inject 1 mg into the muscle once as needed for up to 1 dose.  Marland Kitchen glucose blood (FREESTYLE TEST STRIPS) test strip Use to monitor glucose levels 4 times daily; E11.9  . HYDROcodone-acetaminophen (NORCO) 10-325 MG tablet Take 1 tablet by mouth every 6 (six) hours as needed for moderate pain.  . hypromellose (GENTEAL) 0.3 % GEL ophthalmic ointment Place 1 drop into both eyes at bedtime. Reported on 02/11/2015  . Lancets  (FREESTYLE) lancets Use to monitor glucose levels 4 times daily; E11.9  . lidocaine (LIDODERM) 5 % Place 1 patch onto the skin as needed. Remove & Discard patch within 12 hours. May dispense as 3 month supply  . Lifitegrast 5 % SOLN Place 1 drop into both eyes daily.  . metroNIDAZOLE (METROGEL) 1 % gel Apply topically daily.  . mirabegron ER (MYRBETRIQ) 25 MG TB24 tablet Take 1 tablet (25 mg total) by mouth daily.  . Multiple Vitamins-Minerals (PRESERVISION AREDS) CAPS Take 1 capsule by mouth 2 (two) times daily.   . naloxegol oxalate (MOVANTIK) 25 MG TABS tablet Take 25 mg by mouth daily.  . Naloxone HCl 0.4 MG/0.4ML SOAJ Administer 0.21m [place] at first sign of opioid overdose and repeat every 2 minutes as needed for resuscitation. Call 911 immediately  . NON FORMULARY [facility]  Peripheral Neuropathy Cream- Bupivacaine 1%, Doxepin 3%, Gabapentin 6%, Pentoxifylline 3%, Topiramate 1% Apply 1-2 grams to affected area 3-4 times daily Qty. 120 gm 3 refills  . ondansetron (ZOFRAN) 8 MG tablet Take 1 tablet (8 mg total) by mouth every 8 (eight) hours as needed for nausea or vomiting.  . pregabalin (LYRICA) 50 MG capsule Take 1 capsule (50 mg total) by mouth 2 (two) times daily.  . Probiotic Product (PROBIOTIC-10 PO) Take by mouth.   . sucralfate (CARAFATE) 1 GM/10ML suspension Take 10 mLs (1 g total) by mouth 4 (four) times daily.  . SUMAtriptan (IMITREX) 100 MG tablet Take 1 tablet at earliest onset of headache, may repeat in 2 hours if headache persists or reoccurs.  . Vitamin D, Ergocalciferol, (DRISDOL) 1.25 MG (50000 UT) CAPS capsule Take 1 capsule (50,000 Units total) by mouth every 7 (seven) days.   Past Medical History:  Diagnosis Date  . Allergy   . Anal fissure 10/08/2016  . Anemia    anemia in the past  . Angina    takes Propanolol prn and it stops her chest  . Anxiety   . Barrett esophagus    not consistently present  . Cataract   . Chronic kidney disease    kidney stones  and UTI  . Complication of anesthesia    either  BP or pulse dropped with last surgery  . DDD (degenerative disc disease)    cervical and lumbar  . Dementia (HEcho   . Depression    post traumatic stress  disorder  . Diabetes ([facility]oral 11/18/2016  . Diabetes mellitus    sugar goes up and down diet controlled  . Dysrhythmia    brought on by stress  . External hemorrhoids   . Fatigue   . Fibromyalgia    neuropathy knees ankles and toes, bladder  . GERD (gastroesophageal reflux disease)   . H/O hyperparathyroidism   . Headache(784.0)    migraines  . Hiatal hernia   . History of kidney stones   . Hypertension    3 small brain  aneurysms  . Hypoglycemia 11/08/2016  . IBS (irritable bowel syndrome)   . Internal hemorrhoids   . Macular degeneration   . Migraines   . Mitral regurgitation 12/11/2015  . Osteoarthritis    all over  . Osteopetrosis   . Peripheral vascular disease (HCC)    superficial phlebitis in left calf  . Personal history of colonic polyps 07/22/2013   07/2013 - 3 small adenomas - repeat colonoscopy 2018  . Small intestinal bacterial overgrowth 08/05/2017   + lactulose breath test 06/2017 Xifaxan Rx   Past Surgical History:  Procedure Laterality Date  . ABDOMINAL HYSTERECTOMY    . APPENDECTOMY    . CARDIAC CATHETERIZATION  03/22/1991   EF 61%  . CARDIOVASCULAR STRESS TEST  10/03/2006  . CHOLECYSTECTOMY    . COLONOSCOPY  06/23/2008   normal  . DILATION AND CURETTAGE OF UTERUS     x2  . ESOPHAGUS SURGERY    . EXPLORATORY LAPAROTOMY  1993  . EYE SURGERY    . GASTRIC BYPASS  1999  . GASTRIC RESTRICTION SURGERY  1991  . LASIK Bilateral   . Right Arm Surgery    . Right Knee Arthroscopy    . Rt wrist fx  2009  . spinal surgery battery implant    . stomach stappeling  1991  . STONE EXTRACTION WITH BASKET  2012  . TONSILLECTOMY    . TUBAL LIGATION    . UPPER GASTROINTESTINAL ENDOSCOPY  06/23/2008   w/Dil, Barrett's esophagus  . US ECHOCARDIOGRAPHY  01/21/2007   EF  55-60%  . US ECHOCARDIOGRAPHY  08/30/2003   EF 55-60%   Social History   Social History Narrative   Right handed   Two story home   She retired on disability   Married   2 Children   family history includes Breast cancer in her sister; Clotting disorder in her sister; Colon cancer in her maternal aunt, paternal aunt, and paternal aunt; Diabetes in her mother; Heart disease in her father, mother, sister, and another family member; Hypertension in her father; Kidney disease in her father and sister; Lung cancer in her father, mother, and sister; Pancreatic cancer in her sister; Seizures in her father; Stroke in her father and mother; Uterine cancer in an other family member.   Review of Systems As above  Objective:   Physical Exam BP 90/62 (BP Location: Left Arm, Patient Position: Sitting, Cuff Size: Normal)   Pulse 92   Ht 5' 4.75" (1.645 m) Comment: height measured without shoes  Wt 107 lb (48.5 kg)   BMI 17.94 kg/m  Thin NAD Lungs cta Cor NL s1s2 no rmg abd thin nt, surgical scars BS+

## 2020-04-06 DIAGNOSIS — R350 Frequency of micturition: Secondary | ICD-10-CM | POA: Diagnosis not present

## 2020-04-06 DIAGNOSIS — N3941 Urge incontinence: Secondary | ICD-10-CM | POA: Diagnosis not present

## 2020-04-06 DIAGNOSIS — N3946 Mixed incontinence: Secondary | ICD-10-CM | POA: Diagnosis not present

## 2020-04-10 DIAGNOSIS — M47816 Spondylosis without myelopathy or radiculopathy, lumbar region: Secondary | ICD-10-CM | POA: Diagnosis not present

## 2020-04-10 DIAGNOSIS — Z791 Long term (current) use of non-steroidal anti-inflammatories (NSAID): Secondary | ICD-10-CM | POA: Diagnosis not present

## 2020-04-10 DIAGNOSIS — E43 Unspecified severe protein-calorie malnutrition: Secondary | ICD-10-CM | POA: Diagnosis not present

## 2020-04-10 DIAGNOSIS — M130 Polyarthritis, unspecified: Secondary | ICD-10-CM | POA: Diagnosis not present

## 2020-04-10 DIAGNOSIS — Z79891 Long term (current) use of opiate analgesic: Secondary | ICD-10-CM | POA: Diagnosis not present

## 2020-04-10 DIAGNOSIS — K22719 Barrett's esophagus with dysplasia, unspecified: Secondary | ICD-10-CM | POA: Diagnosis not present

## 2020-04-10 DIAGNOSIS — Q438 Other specified congenital malformations of intestine: Secondary | ICD-10-CM | POA: Diagnosis not present

## 2020-04-10 DIAGNOSIS — E11649 Type 2 diabetes mellitus with hypoglycemia without coma: Secondary | ICD-10-CM | POA: Diagnosis not present

## 2020-04-10 DIAGNOSIS — E1121 Type 2 diabetes mellitus with diabetic nephropathy: Secondary | ICD-10-CM | POA: Diagnosis not present

## 2020-04-10 DIAGNOSIS — R32 Unspecified urinary incontinence: Secondary | ICD-10-CM | POA: Diagnosis not present

## 2020-04-10 DIAGNOSIS — G43001 Migraine without aura, not intractable, with status migrainosus: Secondary | ICD-10-CM | POA: Diagnosis not present

## 2020-04-10 DIAGNOSIS — R109 Unspecified abdominal pain: Secondary | ICD-10-CM | POA: Diagnosis not present

## 2020-04-10 DIAGNOSIS — G609 Hereditary and idiopathic neuropathy, unspecified: Secondary | ICD-10-CM | POA: Diagnosis not present

## 2020-04-10 DIAGNOSIS — G603 Idiopathic progressive neuropathy: Secondary | ICD-10-CM | POA: Diagnosis not present

## 2020-04-10 DIAGNOSIS — K279 Peptic ulcer, site unspecified, unspecified as acute or chronic, without hemorrhage or perforation: Secondary | ICD-10-CM | POA: Diagnosis not present

## 2020-04-10 DIAGNOSIS — Z7984 Long term (current) use of oral hypoglycemic drugs: Secondary | ICD-10-CM | POA: Diagnosis not present

## 2020-04-10 DIAGNOSIS — K581 Irritable bowel syndrome with constipation: Secondary | ICD-10-CM | POA: Diagnosis not present

## 2020-04-11 ENCOUNTER — Other Ambulatory Visit: Payer: Self-pay

## 2020-04-11 ENCOUNTER — Ambulatory Visit (HOSPITAL_COMMUNITY)
Admission: RE | Admit: 2020-04-11 | Discharge: 2020-04-11 | Disposition: A | Discharge status: Home / Self Care | Payer: Medicare Other | Source: Ambulatory Visit | Attending: Gastroenterology | Admitting: Gastroenterology

## 2020-04-11 DIAGNOSIS — E46 Unspecified protein-calorie malnutrition: Secondary | ICD-10-CM | POA: Diagnosis not present

## 2020-04-11 DIAGNOSIS — R1032 Left lower quadrant pain: Secondary | ICD-10-CM | POA: Diagnosis not present

## 2020-04-11 DIAGNOSIS — R1013 Epigastric pain: Secondary | ICD-10-CM | POA: Diagnosis not present

## 2020-04-11 DIAGNOSIS — F5000 Anorexia nervosa, unspecified: Secondary | ICD-10-CM

## 2020-04-11 DIAGNOSIS — R109 Unspecified abdominal pain: Secondary | ICD-10-CM | POA: Diagnosis not present

## 2020-04-11 LAB — POCT I-STAT CREATININE: Creatinine, Ser: 0.6 mg/dL (ref 0.44–1.00)

## 2020-04-11 MED ORDER — IOHEXOL 300 MG/ML  SOLN
100.0000 mL | Freq: Once | INTRAMUSCULAR | Status: AC | PRN
  Administered 2020-04-11: 80 mL via INTRAVENOUS

## 2020-04-20 DIAGNOSIS — R350 Frequency of micturition: Secondary | ICD-10-CM | POA: Diagnosis not present

## 2020-04-21 NOTE — Progress Notes (Signed)
Office Visit Note  Patient: Kayla [name]             Date of Birth: 01-10-1946           MRN: [id number]             PCP: Shawnee Knapp, MD Referring: Shawnee Knapp, MD Visit Date: 05/05/2020 Occupation: @GUAROCC @  Subjective Treatment of osteoporosis.   History of Present Illness: Kayla [name] is a 75 y.o. female seen in consultation per request of her PCP.  Patient was initially seen by me on July 12, 2016 for further evaluation and treatment of osteoporosis.  At the time she stated that she had hysterectomy in her 44s and was soon after diagnosed with osteoporosis.  She was treated with Fosamax from 2013-2018.  She has had history of several fractures including her right ankle right knee joint, right wrist.  She also complains of discomfort in her cervical spine.  She was under care of a chiropractor.  At that visit we discussed possible use of Forteo or Tymlos subcutaneous injections.  At the time Tymlos was approved.  As her vitamin D was low she was given vitamin D prescription and she was to return after vitamin D levels were normal.  She states she has neuropathy.  She has been seen several physicians since her last visit.  She was [facility] at [facility].  She also had a lumbar stimulator placed for her neuropathy in her lower extremities but has not noticed any improvement.  She states she has been having increased pain and discomfort in her joints.  She is concerned that she may have rheumatoid arthritis.  She also has a history of fibromyalgia which causes generalized pain and discomfort.  She has not taken any treatment for osteoporosis since her last visit.  Activities of Daily Living:  Patient reports morning stiffness for all day. Patient Reports nocturnal pain.  Difficulty dressing/grooming: Denies Difficulty climbing stairs: Reports Difficulty getting out of chair: Reports Difficulty using hands for taps, buttons, cutlery, and/or writing: Reports  Review of Systems   Constitutional: Positive for fatigue. Negative for night sweats, weight gain and weight loss.  HENT: Positive for mouth dryness and nose dryness. Negative for mouth sores, trouble swallowing and trouble swallowing.   Eyes: Positive for pain, itching and dryness. Negative for redness and visual disturbance.  Respiratory: Negative for cough, shortness of breath and difficulty breathing.   Cardiovascular: Negative for chest pain, palpitations, hypertension, irregular heartbeat and swelling in legs/feet.  Gastrointestinal: Positive for constipation. Negative for blood in stool and diarrhea.  Endocrine: Negative for increased urination.  Genitourinary: Negative for difficulty urinating and vaginal dryness.  Musculoskeletal: Positive for arthralgias, joint pain, joint swelling, myalgias, muscle weakness, morning stiffness, muscle tenderness and myalgias.  Skin: Positive for color change. Negative for rash, hair loss, redness, skin tightness, ulcers and sensitivity to sunlight.  Allergic/Immunologic: Negative for susceptible to infections.  Neurological: Positive for numbness, headaches, parasthesias and memory loss. Negative for dizziness, night sweats and weakness.  Hematological: Positive for bruising/bleeding tendency. Negative for swollen glands.  Psychiatric/Behavioral: Positive for sleep disturbance. Negative for depressed mood and confusion. The patient is not nervous/anxious.     PMFS History:  Patient Active Problem List   Diagnosis Date Noted  . Epigastric pain 11/11/2019  . Anorexia nervosa 11/11/2019  . LLQ abdominal pain 11/11/2019  . Malnutrition ([place]) 11/11/2019  . Mechanical breakdown implanted electronic neurostimulator spinal cord ([place]) 11/16/2018  . Chronic narcotic dependence ([place])  10/03/2018  . Esophageal dysphagia 10/03/2018  . Neuropathic pain 01/23/2018  . MVP (mitral valve prolapse) 12/17/2017  . Rosacea 11/02/2017  . Chronic pain syndrome 11/02/2017  . Small  intestinal bacterial overgrowth 08/05/2017  . Orthostatic hypotension 11/20/2016  . Diabetes ([place]) 11/18/2016  . Hypoglycemia 11/08/2016  . Cervical facet joint syndrome 10/08/2016  . Anal fissure 10/08/2016  . History of genetic counseling 02/02/2016  . Peripheral vascular disease ([place]) 01/22/2016  . Prediabetes 01/22/2016  . H/O hyperparathyroidism 01/22/2016  . Mitral regurgitation 12/11/2015  . H/O gastric bypass 02/11/2014  . Status post bariatric surgery 02/11/2014  . Chronic maxillary sinusitis 09/15/2013  . Personal history of colonic polyps 07/22/2013  . HA (headache)-chronic/tension type 07/28/2012  . Osteoporosis 07/28/2012  . Nephrolithiasis 07/28/2012  . Fibromyalgia 07/26/2012  . Chronic interstitial cystitis 10/16/2011  . Chronic constipation 10/04/2010  . WEIGHT LOSS 11/27/2009  . PERSONAL HISTORY OF FAILED MODERATE SEDATION 06/01/2008  . Anxiety state 03/18/2007  . Reactive depression 03/18/2007  . GERD 03/18/2007  . Diaphragmatic hernia 03/18/2007  . Osteoarthritis 03/18/2007  . [place] DISEASE, CERVICAL 03/18/2007  . [place] DISEASE, LUMBAR 03/18/2007  . Myalgia and myositis 03/18/2007    Past Medical History:  Diagnosis Date  . Allergy   . Anal fissure 10/08/2016  . Anemia    anemia in the past  . Angina    takes Propanolol prn and it stops her chest  . Anxiety   . Barrett esophagus    not consistently present  . Cataract   . Chronic kidney disease    kidney stones and UTI  . Complication of anesthesia    either  BP or pulse dropped with last surgery  . DDD (degenerative disc disease)    cervical and lumbar  . Dementia ([place])   . Depression    post traumatic stress  disorder  . Diabetes ([place]) 11/18/2016  . Diabetes mellitus    sugar goes up and down diet controlled  . Dysrhythmia    brought on by stress  . External hemorrhoids   . Fatigue   . Fibromyalgia    neuropathy knees ankles and toes, bladder  . GERD (gastroesophageal reflux disease)    . H/O hyperparathyroidism   . Headache(784.0)    migraines  . Hiatal hernia   . History of kidney stones   . Hypertension    3 small brain aneurysms  . Hypoglycemia 11/08/2016  . IBS (irritable bowel syndrome)   . Internal hemorrhoids   . Macular degeneration   . Migraines   . Mitral regurgitation 12/11/2015  . Osteoarthritis    all over  . Osteopetrosis   . Peripheral vascular disease (HCC)    superficial phlebitis in left calf  . Personal history of colonic polyps 07/22/2013   07/2013 - 3 small adenomas - repeat colonoscopy 2018  . Small intestinal bacterial overgrowth 08/05/2017   + lactulose breath test 06/2017 Xifaxan Rx    Family History  Problem Relation Age of Onset  . Stroke Mother   . Lung cancer Mother   . Heart disease Mother   . Diabetes Mother   . Hypertension Father   . Stroke Father   . Heart disease Father   . Kidney disease Father   . Seizures Father        epilepsy, and sisters x 2  . Lung cancer Sister   . Breast cancer Sister   . Pancreatic cancer Sister   . Colon cancer Maternal Aunt  12 relatives  . Uterine cancer Other        aunt  . Heart disease Other        grandmother  . Colon cancer Paternal Aunt   . Colon cancer Paternal Aunt   . Healthy Son   . Healthy Daughter   . Esophageal cancer Neg Hx   . Rectal cancer Neg Hx   . Stomach cancer Neg Hx    Past Surgical History:  Procedure Laterality Date  . ABDOMINAL HYSTERECTOMY    . APPENDECTOMY    . CARDIAC CATHETERIZATION  03/22/1991   EF 61%  . CARDIOVASCULAR STRESS TEST  10/03/2006  . CHOLECYSTECTOMY    . COLONOSCOPY  06/23/2008   normal  . DILATION AND CURETTAGE OF UTERUS     x2  . ESOPHAGUS SURGERY    . EXPLORATORY LAPAROTOMY  1993  . EYE SURGERY    . GASTRIC BYPASS  1999  . GASTRIC RESTRICTION SURGERY  1991  . LASIK Bilateral   . Right Arm Surgery    . Right Knee Arthroscopy    . Rt wrist fx  2009  . spinal surgery battery implant    . stomach stappeling  1991  .  STONE EXTRACTION WITH BASKET  2012  . TONSILLECTOMY    . TUBAL LIGATION    . UPPER GASTROINTESTINAL ENDOSCOPY  06/23/2008   w/Dil, Barrett's esophagus  . US ECHOCARDIOGRAPHY  01/21/2007   EF 55-60%  . US ECHOCARDIOGRAPHY  08/30/2003   EF 55-60%   Social History   Social History Narrative   Right handed   Two story home   She retired on disability   Married   2 Children   Immunization History  Administered Date(s) Administered  . Hepatitis B, adult 04/21/2017  . Influenza Split 10/02/2011  . Influenza Whole 08/21/2007  . Influenza, High Dose Seasonal PF 12/29/2017  . Influenza,inj,Quad PF,6+ Mos 12/15/2013, 11/23/2014, 09/27/2015, 10/04/2016  . Influenza-Unspecified 12/15/2013, 11/23/2014, 09/27/2015, 10/04/2016  . PFIZER(Purple Top)SARS-COV-2 Vaccination 03/31/2019, 04/20/2019  . Pneumococcal Conjugate-13 09/05/2014  . Pneumococcal Polysaccharide-23 10/02/2011, 02/08/2013  . Td 01/14/2006  . Tdap 01/22/2016     Objective: Vital Signs: BP 98/63 (BP Location: Right Arm, Patient Position: Sitting, Cuff Size: Normal)   Pulse 86   Ht 5' 5.5" (1.664 m)   Wt 101 lb 12.8 oz (46.2 kg)   BMI 16.68 kg/m    Physical Exam Vitals and nursing note reviewed.  Constitutional:      Appearance: She is well-developed.  HENT:     Head: Normocephalic and atraumatic.  Eyes:     Conjunctiva/sclera: Conjunctivae normal.  Cardiovascular:     Rate and Rhythm: Normal rate and regular rhythm.     Heart sounds: Normal heart sounds.  Pulmonary:     Effort: Pulmonary effort is normal.     Breath sounds: Normal breath sounds.  Abdominal:     General: Bowel sounds are normal.     Palpations: Abdomen is soft.  Musculoskeletal:     Cervical back: Normal range of motion.  Lymphadenopathy:     Cervical: No cervical adenopathy.  Skin:    General: Skin is warm and dry.     Capillary Refill: Capillary refill takes less than 2 seconds.  Neurological:     Mental Status: She is alert and oriented  to person, place, and time.  Psychiatric:        Behavior: Behavior normal.      Musculoskeletal Exam: She has limited painful range of motion  of the cervical and lumbar spine.  Shoulder joints, elbow joints, wrist joints, MCPs PIPs and DIPs with good range of motion.  She has PIP and DIP thickening with no synovitis.  Hip joints and knee joints with good range of motion with no warmth swelling or effusion.  She had no tenderness over ankles or MTPs.  CDAI Exam: CDAI Score: -- Patient Global: --; Provider Global: -- Swollen: --; Tender: -- Joint Exam 05/05/2020   No joint exam has been documented for this visit   There is currently no information documented on the homunculus. Go to the Rheumatology activity and complete the homunculus joint exam.  Investigation: No additional findings.  Imaging: CT Abdomen Pelvis W Contrast  Result Date: 04/12/2020 CLINICAL DATA:  Epigastric pain. LEFT lower quadrant pain. Abdominal pain EXAM: CT ABDOMEN AND PELVIS WITH CONTRAST TECHNIQUE: Multidetector CT imaging of the abdomen and pelvis was performed using the standard protocol following bolus administration of intravenous contrast. CONTRAST:  21mL OMNIPAQUE IOHEXOL 300 MG/ML  SOLN COMPARISON:  CT 11/16/2018 FINDINGS: Lower chest: Lung bases are clear. Hepatobiliary: Chronic intrahepatic and extrahepatic biliary duct dilatation not changed from prior. Common bile duct measures 11 mm in diameter compared to 12 mm on CT 2020. No obstructing lesion identified. There is atrophy in the LEFT hepatic lobe similar to prior. No enhancing hepatic lesion present. No ascites. Pancreas: No pancreatic duct dilatation.  No pancreatic inflammation Spleen: Several small granulomata within the spleen. Spleen is normal volume. Adrenals/urinary tract: Adrenal glands and kidneys are normal. The ureters and bladder normal. Stomach/Bowel: Post bariatric surgery. No evidence of gastric outlet obstruction. Small bowel is normal.  No evidence of bowel obstruction. Contrast flows into the ascending colon. Appendix not identified. Moderate volume stool throughout the colon. Moderate to large volume stool in the sigmoid colon rectum. No obstructing lesion. Vascular/Lymphatic: Abdominal aorta is normal. Portal veins are patent. Venous collaterals in the gastrosplenic ligament (image 13/2). No abdominopelvic lymphadenopathy. Reproductive: Post hysterectomy.  Adnexa unremarkable Other: A generator pack in the RIGHT flank. Musculoskeletal: No acute osseous abnormality. IMPRESSION: 1. No acute findings in the abdomen or pelvis. 2. Chronic intrahepatic and extrahepatic biliary dilatation unchanged from CT 2020. 3. Post bariatric surgery without evidence complication. 4. Moderate to large volume stool and gas throughout the colon without evidence obstruction. 5. Venous collaterals in the gastrosplenic ligament suggest portal hypertension. Electronically Signed   By: Suzy Bouchard M.D.   On: 04/12/2020 09:54    Recent Labs: Lab Results  Component Value Date   WBC 8.1 07/06/2018   HGB 12.1 07/06/2018   PLT 277 07/06/2018   NA 139 11/10/2019   K 4.7 11/10/2019   CL 105 11/10/2019   CO2 31 11/10/2019   GLUCOSE 87 11/10/2019   BUN 22 11/10/2019   CREATININE 0.60 04/11/2020   BILITOT 0.3 07/06/2018   ALKPHOS 72 07/06/2018   AST 26 07/06/2018   ALT 22 07/06/2018   PROT 6.1 07/06/2018   ALBUMIN 4.4 07/06/2018   CALCIUM 8.9 11/10/2019   GFRAA 102 07/06/2018    Speciality Comments: No specialty comments available.  Procedures:  No procedures performed Allergies: Ambien [zolpidem tartrate], Codeine, Oxycodone-acetaminophen, Tylox [oxycodone-acetaminophen], Darvocet [propoxyphene n-acetaminophen], Darvon [propoxyphene], Lorcet [hydrocodone-acetaminophen], Opium, Vicodin [hydrocodone-acetaminophen], Wheat bran, Amoxicillin, and Penicillins   Assessment / Plan:     Visit Diagnoses: Age-related osteoporosis without current  pathological fracture - DEXA on 01/11/20: BMD measured at AP spine L1-L4 is 0.723 with a T-score of -3.8.   -Patient was treated with  Fosamax from 20 15 till 2018.  She was seen by me in 2018 at that time Tymlos was offered.  She also had severe vitamin D deficiency.  She did not return for follow-up visit.  She returns today and wants to start on treatment.  She states her vitamin D has been good recently.  We will check labs today.  Different treatment options and their side effects were discussed at length.  She is not interested in [employer] that she travels frequently.  I discussed the option of Prolia subcutaneous injections.  Side effects were reviewed.  Patient wants to proceed with Prolia.  We will apply for Prolia.  Plan: Parathyroid hormone, intact (no Ca), VITAMIN D 25 Hydroxy (Vit-D Deficiency, Fractures), Phosphorus, Serum protein electrophoresis with reflex  Medication management - Plan: CBC with Differential/Platelet, COMPLETE METABOLIC PANEL WITH GFR  Other fatigue - Plan: TSH  Chronic pain syndrome she has chronic pain and also suffers from fibromyalgia.  Neuropathic pain-she complains of severe neuropathic pain.  She states she has been followed at [facility] and has a stimulator which is not helping.  DDD (degenerative disc disease), cervical-she has chronic pain and discomfort.  DDD (degenerative disc disease), lumbar-chronic pain and discomfort.  Myalgia-she has been diagnosed with fibromyalgia and suffers from chronic pain.  Anxiety and depression  History of gastroesophageal reflux (GERD)  IC (interstitial cystitis)  Malnutrition, unspecified type (HCC)-patient states that she is struggling with malnutrition and will be seeing somebody at [facility].  Hypoglycemia-she states that her blood glucose levels fluctuate from high to very low.    Orders: Orders Placed This Encounter  Procedures  . CBC with Differential/Platelet  . COMPLETE METABOLIC PANEL WITH GFR  . Parathyroid  hormone, intact (no Ca)  . VITAMIN D 25 Hydroxy (Vit-D Deficiency, Fractures)  . Phosphorus  . TSH  . Serum protein electrophoresis with reflex   No orders of the defined types were placed in this encounter.    Follow-Up Instructions: Return for Osteoporosis, Osteoarthritis.   Bo Merino, MD  Note - This record has been created using Editor, commissioning.  Chart creation errors have been sought, but may not always  have been located. Such creation errors do not reflect on  the standard of medical care.

## 2020-04-27 DIAGNOSIS — N3941 Urge incontinence: Secondary | ICD-10-CM | POA: Diagnosis not present

## 2020-05-04 DIAGNOSIS — N3941 Urge incontinence: Secondary | ICD-10-CM | POA: Diagnosis not present

## 2020-05-05 ENCOUNTER — Ambulatory Visit (INDEPENDENT_AMBULATORY_CARE_PROVIDER_SITE_OTHER): Payer: Medicare Other | Admitting: Rheumatology

## 2020-05-05 ENCOUNTER — Telehealth: Payer: Self-pay

## 2020-05-05 ENCOUNTER — Other Ambulatory Visit: Payer: Self-pay

## 2020-05-05 ENCOUNTER — Encounter: Payer: Self-pay | Admitting: Rheumatology

## 2020-05-05 VITALS — BP 98/63 | HR 86 | Ht 65.5 in | Wt 101.8 lb

## 2020-05-05 DIAGNOSIS — M791 Myalgia, unspecified site: Secondary | ICD-10-CM | POA: Diagnosis not present

## 2020-05-05 DIAGNOSIS — R5383 Other fatigue: Secondary | ICD-10-CM

## 2020-05-05 DIAGNOSIS — N3010 Interstitial cystitis (chronic) without hematuria: Secondary | ICD-10-CM | POA: Diagnosis not present

## 2020-05-05 DIAGNOSIS — F419 Anxiety disorder, unspecified: Secondary | ICD-10-CM

## 2020-05-05 DIAGNOSIS — M810 Age-related osteoporosis without current pathological fracture: Secondary | ICD-10-CM | POA: Diagnosis not present

## 2020-05-05 DIAGNOSIS — M792 Neuralgia and neuritis, unspecified: Secondary | ICD-10-CM | POA: Diagnosis not present

## 2020-05-05 DIAGNOSIS — E162 Hypoglycemia, unspecified: Secondary | ICD-10-CM

## 2020-05-05 DIAGNOSIS — M5030 Other cervical disc degeneration, unspecified cervical region: Secondary | ICD-10-CM

## 2020-05-05 DIAGNOSIS — E46 Unspecified protein-calorie malnutrition: Secondary | ICD-10-CM | POA: Diagnosis not present

## 2020-05-05 DIAGNOSIS — M5136 Other intervertebral disc degeneration, lumbar region: Secondary | ICD-10-CM | POA: Diagnosis not present

## 2020-05-05 DIAGNOSIS — Z8719 Personal history of other diseases of the digestive system: Secondary | ICD-10-CM | POA: Diagnosis not present

## 2020-05-05 DIAGNOSIS — G894 Chronic pain syndrome: Secondary | ICD-10-CM | POA: Diagnosis not present

## 2020-05-05 DIAGNOSIS — F32A Depression, unspecified: Secondary | ICD-10-CM

## 2020-05-05 DIAGNOSIS — Z79899 Other long term (current) drug therapy: Secondary | ICD-10-CM

## 2020-05-05 NOTE — Patient Instructions (Signed)
Denosumab injection What is this medicine? DENOSUMAB (den oh sue mab) slows bone breakdown. Prolia is used to treat osteoporosis in women after menopause and in men, and in people who are taking corticosteroids for 6 months or more. Xgeva is used to treat a high calcium level due to cancer and to prevent bone fractures and other bone problems caused by multiple myeloma or cancer bone metastases. Xgeva is also used to treat giant cell tumor of the bone. This medicine may be used for other purposes; ask your health care provider or pharmacist if you have questions. COMMON BRAND NAME(S): Prolia, XGEVA What should I tell my health care provider before I take this medicine? They need to know if you have any of these conditions:  dental disease  having surgery or tooth extraction  infection  kidney disease  low levels of calcium or Vitamin D in the blood  malnutrition  on hemodialysis  skin conditions or sensitivity  thyroid or parathyroid disease  an unusual reaction to denosumab, other medicines, foods, dyes, or preservatives  pregnant or trying to get pregnant  breast-feeding How should I use this medicine? This medicine is for injection under the skin. It is given by a health care professional in a hospital or clinic setting. A special MedGuide will be given to you before each treatment. Be sure to read this information carefully each time. For Prolia, talk to your pediatrician regarding the use of this medicine in children. Special care may be needed. For Xgeva, talk to your pediatrician regarding the use of this medicine in children. While this drug may be prescribed for children as young as 13 years for selected conditions, precautions do apply. Overdosage: If you think you have taken too much of this medicine contact a poison control center or emergency room at once. NOTE: This medicine is only for you. Do not share this medicine with others. What if I miss a dose? It is  important not to miss your dose. Call your doctor or health care professional if you are unable to keep an appointment. What may interact with this medicine? Do not take this medicine with any of the following medications:  other medicines containing denosumab This medicine may also interact with the following medications:  medicines that lower your chance of fighting infection  steroid medicines like prednisone or cortisone This list may not describe all possible interactions. Give your health care provider a list of all the medicines, herbs, non-prescription drugs, or dietary supplements you use. Also tell them if you smoke, drink alcohol, or use illegal drugs. Some items may interact with your medicine. What should I watch for while using this medicine? Visit your doctor or health care professional for regular checks on your progress. Your doctor or health care professional may order blood tests and other tests to see how you are doing. Call your doctor or health care professional for advice if you get a fever, chills or sore throat, or other symptoms of a cold or flu. Do not treat yourself. This drug may decrease your body's ability to fight infection. Try to avoid being around people who are sick. You should make sure you get enough calcium and vitamin D while you are taking this medicine, unless your doctor tells you not to. Discuss the foods you eat and the vitamins you take with your health care professional. See your dentist regularly. Brush and floss your teeth as directed. Before you have any dental work done, tell your dentist you are   receiving this medicine. Do not become pregnant while taking this medicine or for 5 months after stopping it. Talk with your doctor or health care professional about your birth control options while taking this medicine. Women should inform their doctor if they wish to become pregnant or think they might be pregnant. There is a potential for serious side  effects to an unborn child. Talk to your health care professional or pharmacist for more information. What side effects may I notice from receiving this medicine? Side effects that you should report to your doctor or health care professional as soon as possible:  allergic reactions like skin rash, itching or hives, swelling of the face, lips, or tongue  bone pain  breathing problems  dizziness  jaw pain, especially after dental work  redness, blistering, peeling of the skin  signs and symptoms of infection like fever or chills; cough; sore throat; pain or trouble passing urine  signs of low calcium like fast heartbeat, muscle cramps or muscle pain; pain, tingling, numbness in the hands or feet; seizures  unusual bleeding or bruising  unusually weak or tired Side effects that usually do not require medical attention (report to your doctor or health care professional if they continue or are bothersome):  constipation  diarrhea  headache  joint pain  loss of appetite  muscle pain  runny nose  tiredness  upset stomach This list may not describe all possible side effects. Call your doctor for medical advice about side effects. You may report side effects to FDA at 1-800-FDA-1088. Where should I keep my medicine? This medicine is only given in a clinic, doctor's office, or other health care setting and will not be stored at home. NOTE: This sheet is a summary. It may not cover all possible information. If you have questions about this medicine, talk to your doctor, pharmacist, or health care provider.  2021 Elsevier/Gold Standard (2017-05-09 16:10:44)

## 2020-05-05 NOTE — Telephone Encounter (Signed)
Please apply for prolia per Dr. Deveshwar. Thanks!  ?

## 2020-05-08 ENCOUNTER — Other Ambulatory Visit (HOSPITAL_COMMUNITY): Payer: Self-pay

## 2020-05-08 NOTE — Telephone Encounter (Signed)
Medical benefit-  Findings of benefits investigation for Prolia :   Insurance: Medicare/ [employer]    Phone:  [phone]  Plan is ACTIVE  Medicare covers 80% of the infusion and no authorization is required, and [employer] would cover the 20% of the cost that was not paid for by Medicare as long as Medicare covered the medication after patient meets her $233 Medicare B deductible.  *Patient's deductible may have been met, unable to verify   Pharmacy benefit:  Ran test claim- no PA required. Patient's copay is $361.61.

## 2020-05-08 NOTE — Progress Notes (Signed)
CBC shows mild anemia, CMP is unremarkable, PTH, phosphorus, vitamin D, TSH are within normal limits.  IFE is pending.  Okay to apply for Prolia.

## 2020-05-08 NOTE — Telephone Encounter (Signed)
Called [employer] [facility], they do not stock Prolia.

## 2020-05-09 ENCOUNTER — Encounter: Payer: Self-pay | Admitting: *Deleted

## 2020-05-09 ENCOUNTER — Other Ambulatory Visit (HOSPITAL_COMMUNITY): Payer: Self-pay

## 2020-05-09 NOTE — Telephone Encounter (Signed)
Left VM with patient to discuss Prolia findings and lab results

## 2020-05-10 LAB — TSH: TSH: 1.98 mIU/L (ref 0.40–4.50)

## 2020-05-10 LAB — CBC WITH DIFFERENTIAL/PLATELET
Absolute Monocytes: 694 cells/uL (ref 200–950)
Basophils Absolute: 45 cells/uL (ref 0–200)
Basophils Relative: 0.5 %
Eosinophils Absolute: 365 cells/uL (ref 15–500)
Eosinophils Relative: 4.1 %
HCT: 37.1 % (ref 35.0–45.0)
Hemoglobin: 11.6 g/dL — ABNORMAL LOW (ref 11.7–15.5)
Lymphs Abs: 3302 cells/uL (ref 850–3900)
MCH: 27.9 pg (ref 27.0–33.0)
MCHC: 31.3 g/dL — ABNORMAL LOW (ref 32.0–36.0)
MCV: 89.2 fL (ref 80.0–100.0)
MPV: 9.7 fL (ref 7.5–12.5)
Monocytes Relative: 7.8 %
Neutro Abs: 4495 cells/uL (ref 1500–7800)
Neutrophils Relative %: 50.5 %
Platelets: 297 10*3/uL (ref 140–400)
RBC: 4.16 10*6/uL (ref 3.80–5.10)
RDW: 13.9 % (ref 11.0–15.0)
Total Lymphocyte: 37.1 %
WBC: 8.9 10*3/uL (ref 3.8–10.8)

## 2020-05-10 LAB — PROTEIN ELECTROPHORESIS, SERUM, WITH REFLEX
Albumin ELP: 4.4 g/dL (ref 3.8–4.8)
Alpha 1: 0.2 g/dL (ref 0.2–0.3)
Alpha 2: 0.6 g/dL (ref 0.5–0.9)
Beta 2: 0.2 g/dL (ref 0.2–0.5)
Beta Globulin: 0.4 g/dL (ref 0.4–0.6)
Gamma Globulin: 0.6 g/dL — ABNORMAL LOW (ref 0.8–1.7)
Total Protein: 6.4 g/dL (ref 6.1–8.1)

## 2020-05-10 LAB — COMPLETE METABOLIC PANEL WITH GFR
AG Ratio: 2.2 (calc) (ref 1.0–2.5)
ALT: 22 U/L (ref 6–29)
AST: 24 U/L (ref 10–35)
Albumin: 4.3 g/dL (ref 3.6–5.1)
Alkaline phosphatase (APISO): 59 U/L (ref 37–153)
BUN/Creatinine Ratio: 53 (calc) — ABNORMAL HIGH (ref 6–22)
BUN: 39 mg/dL — ABNORMAL HIGH (ref 7–25)
CO2: 27 mmol/L (ref 20–32)
Calcium: 9.3 mg/dL (ref 8.6–10.4)
Chloride: 104 mmol/L (ref 98–110)
Creat: 0.73 mg/dL (ref 0.60–0.93)
GFR, Est African American: 95 mL/min/{1.73_m2} (ref >=60)
GFR, Est Non African American: 82 mL/min/{1.73_m2} (ref >=60)
Globulin: 2 g/dL (calc) (ref 1.9–3.7)
Glucose, Bld: 81 mg/dL (ref 65–99)
Potassium: 4.6 mmol/L (ref 3.5–5.3)
Sodium: 141 mmol/L (ref 135–146)
Total Bilirubin: 0.5 mg/dL (ref 0.2–1.2)
Total Protein: 6.3 g/dL (ref 6.1–8.1)

## 2020-05-10 LAB — IFE INTERPRETATION: Immunofix Electr Int: NOT DETECTED

## 2020-05-10 LAB — VITAMIN D 25 HYDROXY (VIT D DEFICIENCY, FRACTURES): Vit D, 25-Hydroxy: 35 ng/mL (ref 30–100)

## 2020-05-10 LAB — PARATHYROID HORMONE, INTACT (NO CA): PTH: 65 pg/mL (ref 16–77)

## 2020-05-10 LAB — PHOSPHORUS: Phosphorus: 4.1 mg/dL (ref 2.1–4.3)

## 2020-05-11 ENCOUNTER — Other Ambulatory Visit: Payer: Self-pay | Admitting: Pharmacist

## 2020-05-11 DIAGNOSIS — M810 Age-related osteoporosis without current pathological fracture: Secondary | ICD-10-CM

## 2020-05-11 DIAGNOSIS — N3941 Urge incontinence: Secondary | ICD-10-CM | POA: Diagnosis not present

## 2020-05-11 NOTE — Telephone Encounter (Signed)
Patient returned call and we discussed Prolia findings. She states she has met her Part B deductible and is comfortable with proceeding with Prolia through hospital benefit. Prolia orders placed for [facility] and patient provided with phone number

## 2020-05-11 NOTE — Progress Notes (Addendum)
Prolia injection not yet scheduled and due for updated orders. This [name] be her first Prolia injection.  Diagnosis: age-related osteoporosis without fracture  Dose: 60mg  every 6 months  Last Clinic Visit: 05/05/20 Next Clinic Visit: 06/05/20  Labs: 05/05/20 -CBC showed slight anemia but CMP, PTH, phosphorus, vitamin D, and TSH are wnl  Orders placed for Prolia x 1 dose.  Called patient and provided with phone number for: [facility] [phone]) Kayla [name] f/u to ensure Prolia is scheduled and completed  Knox Saliva, PharmD, MPH Clinical Pharmacist (Rheumatology and Pulmonology)  Addendum: Prolia scheduled for 05/26/20. [name] f/u to ensure completed

## 2020-05-11 NOTE — Telephone Encounter (Signed)
Left VM with patient regarding Prolia. Will send MyChart message

## 2020-05-12 ENCOUNTER — Other Ambulatory Visit (HOSPITAL_COMMUNITY): Payer: Self-pay | Admitting: *Deleted

## 2020-05-18 DIAGNOSIS — N3941 Urge incontinence: Secondary | ICD-10-CM | POA: Diagnosis not present

## 2020-05-18 DIAGNOSIS — R350 Frequency of micturition: Secondary | ICD-10-CM | POA: Diagnosis not present

## 2020-05-22 ENCOUNTER — Other Ambulatory Visit: Payer: Self-pay

## 2020-05-22 ENCOUNTER — Encounter: Payer: Self-pay | Admitting: Internal Medicine

## 2020-05-22 ENCOUNTER — Ambulatory Visit (AMBULATORY_SURGERY_CENTER): Payer: Medicare Other | Admitting: Internal Medicine

## 2020-05-22 VITALS — BP 109/64 | HR 78 | Temp 98.4°F | Resp 11 | Ht 64.0 in | Wt 107.0 lb

## 2020-05-22 DIAGNOSIS — Z86010 Personal history of colonic polyps: Secondary | ICD-10-CM

## 2020-05-22 DIAGNOSIS — R1319 Other dysphagia: Secondary | ICD-10-CM

## 2020-05-22 DIAGNOSIS — R1310 Dysphagia, unspecified: Secondary | ICD-10-CM | POA: Diagnosis not present

## 2020-05-22 DIAGNOSIS — M797 Fibromyalgia: Secondary | ICD-10-CM | POA: Diagnosis not present

## 2020-05-22 DIAGNOSIS — E119 Type 2 diabetes mellitus without complications: Secondary | ICD-10-CM | POA: Diagnosis not present

## 2020-05-22 DIAGNOSIS — K219 Gastro-esophageal reflux disease without esophagitis: Secondary | ICD-10-CM | POA: Diagnosis not present

## 2020-05-22 MED ORDER — SODIUM CHLORIDE 0.9 % IV SOLN: 500.0000 mL | Freq: Once | INTRAVENOUS | Status: DC

## 2020-05-22 NOTE — Patient Instructions (Addendum)
The upper endoscopy looks the same as always. I dilated it - hope it helps again.  We will regroup on the colonoscopy.   I appreciate the opportunity to care for you. Gatha Mayer, MD, FACG    YOU HAD AN ENDOSCOPIC PROCEDURE TODAY AT [facility]:   Refer to the procedure report that was given to you for any specific questions about what was found during the examination.  If the procedure report does not answer your questions, please call your gastroenterologist to clarify.  If you requested that your care partner not be given the details of your procedure findings, then the procedure report has been included in a sealed envelope for you to review at your convenience later.  YOU SHOULD EXPECT: Some feelings of bloating in the abdomen. Passage of more gas than usual.  Walking can help get rid of the air that was put into your GI tract during the procedure and reduce the bloating. If you had a lower endoscopy (such as a colonoscopy or flexible sigmoidoscopy) you may notice spotting of blood in your stool or on the toilet paper. If you underwent a bowel prep for your procedure, you may not have a normal bowel movement for a few days.  Please Note:  You might notice some irritation and congestion in your nose or some drainage.  This is from the oxygen used during your procedure.  There is no need for concern and it should clear up in a day or so.  SYMPTOMS TO REPORT IMMEDIATELY:    Following upper endoscopy (EGD)  Vomiting of blood or coffee ground material  New chest pain or pain under the shoulder blades  Painful or persistently difficult swallowing  New shortness of breath  Fever of 100F or higher  Black, tarry-looking stools  For urgent or emergent issues, a gastroenterologist can be reached at any hour by calling [phone]. Do not use MyChart messaging for urgent concerns.    DIET:  Follow Dilation Handout  ACTIVITY:  You should plan to take it easy for the  rest of today and you should NOT DRIVE or use heavy machinery until tomorrow (because of the sedation medicines used during the test).    FOLLOW UP: Our staff will call the number listed on your records 48-72 hours following your procedure to check on you and address any questions or concerns that you may have regarding the information given to you following your procedure. If we do not reach you, we will leave a message.  We will attempt to reach you two times.  During this call, we will ask if you have developed any symptoms of COVID 19. If you develop any symptoms (ie: fever, flu-like symptoms, shortness of breath, cough etc.) before then, please call [phone].  If you test positive for Covid 19 in the 2 weeks post procedure, please call and report this information to [place].    If any biopsies were taken you will be contacted by phone or by letter within the next 1-3 weeks.  Please call us at [phone] if you have not heard about the biopsies in 3 weeks.    SIGNATURES/CONFIDENTIALITY: You and/or your care partner have signed paperwork which will be entered into your electronic medical record.  These signatures attest to the fact that that the information above on your After Visit Summary has been reviewed and is understood.  Full responsibility of the confidentiality of this discharge information lies with you and/or your care-partner.  Resume medications.Follow Dilation handout.pre-vist 08/03/20 @ 9am and procedure 08/15/20 @ 9:30 am

## 2020-05-22 NOTE — Progress Notes (Signed)
Pt's states no medical or surgical changes since previsit or office visit. 

## 2020-05-22 NOTE — Progress Notes (Signed)
Report to PACU, RN, vss, BBS= Clear.  

## 2020-05-22 NOTE — Progress Notes (Signed)
Called to room to assist during endoscopic procedure.  Patient ID and intended procedure confirmed with present staff. Received instructions for my participation in the procedure from the performing physician.  

## 2020-05-22 NOTE — Progress Notes (Signed)
EGD and Maloney dilation  Proofoil ssedation  Indication - Dysphagia  Findings - normal s/p gastrojejunostomy  54 Fr Maloney dilation performed - no heme or traume  Full report to follow  Will schedule colonoscopy w/ double prep

## 2020-05-23 NOTE — Progress Notes (Signed)
Office Visit Note  Patient: Kayla [name]             Date of Birth: [birth year]           MRN: [id number]             PCP: Shawnee Knapp, MD Referring: Shawnee Knapp, MD Visit Date: 06/05/2020 Occupation: @GUAROCC @  Subjective:  Medication management.   History of Present Illness: Kayla [name] is a 75 y.o. female with with history of osteoporosis, osteoarthritis, DDD and fibromyalgia syndrome.  She states she had Prolia injection on May 26, 2020 and tolerated it well.  She has been taking calcium and vitamin D.  She continues to have a lot of discomfort in her neck and lower back.  She states she has tried physical therapy in the past but stopped going because of the COVID-19 pandemic.  She would like to start again.  She states despite taking multiple medication she still continues to be in a lot of discomfort.  She also continues to have discomfort from fibromyalgia.  Activities of Daily Living:  Patient reports morning stiffness for 25-30  minutes.   Patient Reports nocturnal pain.  Difficulty dressing/grooming: Denies Difficulty climbing stairs: Reports Difficulty getting out of chair: Reports Difficulty using hands for taps, buttons, cutlery, and/or writing: Reports  Review of Systems  Constitutional: Positive for fatigue.  HENT: Positive for mouth dryness and nose dryness. Negative for mouth sores.   Eyes: Positive for dryness. Negative for pain and itching.  Respiratory: Negative for shortness of breath and difficulty breathing.   Cardiovascular: Negative for chest pain and palpitations.  Gastrointestinal: Positive for constipation. Negative for blood in stool and diarrhea.  Endocrine: Negative for increased urination.  Genitourinary: Negative for difficulty urinating.  Musculoskeletal: Positive for arthralgias, joint pain, myalgias, morning stiffness, muscle tenderness and myalgias. Negative for joint swelling.  Skin: Positive for color change. Negative for rash and  redness.  Allergic/Immunologic: Negative for susceptible to infections.  Neurological: Positive for numbness, headaches and weakness. Negative for dizziness and memory loss.  Hematological: Positive for anemia and bruising/bleeding tendency.  Psychiatric/Behavioral: Positive for depressed mood and sleep disturbance. Negative for confusion. The patient is nervous/anxious.     PMFS History:  Patient Active Problem List   Diagnosis Date Noted  . Epigastric pain 11/11/2019  . Anorexia nervosa 11/11/2019  . LLQ abdominal pain 11/11/2019  . Malnutrition ([place]) 11/11/2019  . Mechanical breakdown implanted electronic neurostimulator spinal cord ([place]) 11/16/2018  . Chronic narcotic dependence ([place]) 10/03/2018  . Esophageal dysphagia 10/03/2018  . Neuropathic pain 01/23/2018  . MVP (mitral valve prolapse) 12/17/2017  . Rosacea 11/02/2017  . Chronic pain syndrome 11/02/2017  . Small intestinal bacterial overgrowth 08/05/2017  . Orthostatic hypotension 11/20/2016  . Diabetes ([place]) 11/18/2016  . Hypoglycemia 11/08/2016  . Cervical facet joint syndrome 10/08/2016  . Anal fissure 10/08/2016  . History of genetic counseling 02/02/2016  . Peripheral vascular disease ([place]) 01/22/2016  . Prediabetes 01/22/2016  . H/O hyperparathyroidism 01/22/2016  . Mitral regurgitation 12/11/2015  . H/O gastric bypass 02/11/2014  . Status post bariatric surgery 02/11/2014  . Chronic maxillary sinusitis 09/15/2013  . Personal history of colonic polyps 07/22/2013  . HA (headache)-chronic/tension type 07/28/2012  . Osteoporosis 07/28/2012  . Nephrolithiasis 07/28/2012  . Fibromyalgia 07/26/2012  . Chronic interstitial cystitis 10/16/2011  . Chronic constipation 10/04/2010  . WEIGHT LOSS 11/27/2009  . PERSONAL HISTORY OF FAILED MODERATE SEDATION 06/01/2008  . Anxiety state 03/18/2007  . Reactive  depression 03/18/2007  . GERD 03/18/2007  . Diaphragmatic hernia 03/18/2007  . Osteoarthritis 03/18/2007  . [place]  DISEASE, CERVICAL 03/18/2007  . [place] DISEASE, LUMBAR 03/18/2007  . Myalgia and myositis 03/18/2007    Past Medical History:  Diagnosis Date  . Allergy   . Anal fissure 10/08/2016  . Anemia    anemia in the past  . Angina    takes Propanolol prn and it stops her chest  . Anxiety   . Barrett esophagus    not consistently present  . Cataract   . Chronic kidney disease    kidney stones and UTI  . Complication of anesthesia    either  BP or pulse dropped with last surgery  . DDD (degenerative disc disease)    cervical and lumbar  . Dementia ([place])   . Depression    post traumatic stress  disorder  . Diabetes ([place]) 11/18/2016  . Diabetes mellitus    sugar goes up and down diet controlled  . Dysrhythmia    brought on by stress  . External hemorrhoids   . Fatigue   . Fibromyalgia    neuropathy knees ankles and toes, bladder  . GERD (gastroesophageal reflux disease)   . H/O hyperparathyroidism   . Headache(784.0)    migraines  . Hiatal hernia   . History of kidney stones   . Hypertension    3 small brain aneurysms  . Hypoglycemia 11/08/2016  . IBS (irritable bowel syndrome)   . Internal hemorrhoids   . Macular degeneration   . Migraines   . Mitral regurgitation 12/11/2015  . Osteoarthritis    all over  . Osteopetrosis   . Peripheral vascular disease ([facility])    superficial phlebitis in left calf  . Personal history of colonic polyps 07/22/2013   07/2013 - 3 small adenomas - repeat colonoscopy 2018  . Small intestinal bacterial overgrowth 08/05/2017   + lactulose breath test 06/2017 Xifaxan Rx    Family History  Problem Relation Age of Onset  . Stroke Mother   . Lung cancer Mother   . Heart disease Mother   . Diabetes Mother   . Hypertension Father   . Stroke Father   . Heart disease Father   . Kidney disease Father   . Seizures Father        epilepsy, and sisters x 2  . Lung cancer Sister   . Breast cancer Sister   . Pancreatic cancer Sister   . Colon cancer  Maternal Aunt        12 relatives  . Uterine cancer Other        aunt  . Heart disease Other        grandmother  . Colon cancer Paternal Aunt   . Colon cancer Paternal Aunt   . Healthy Son   . Healthy Daughter   . Esophageal cancer Neg Hx   . Rectal cancer Neg Hx   . Stomach cancer Neg Hx    Past Surgical History:  Procedure Laterality Date  . ABDOMINAL HYSTERECTOMY    . APPENDECTOMY    . CARDIAC CATHETERIZATION  03/22/1991   EF 61%  . CARDIOVASCULAR STRESS TEST  10/03/2006  . CHOLECYSTECTOMY    . COLONOSCOPY  06/23/2008   normal  . DILATION AND CURETTAGE OF UTERUS     x2  . ESOPHAGUS SURGERY    . EXPLORATORY LAPAROTOMY  1993  . EYE SURGERY    . GASTRIC BYPASS  1999  . GASTRIC RESTRICTION SURGERY  1991  . LASIK Bilateral   . Right Arm Surgery    . Right Knee Arthroscopy    . Rt wrist fx  2009  . spinal surgery battery implant    . stomach stappeling  1991  . STONE EXTRACTION WITH BASKET  2012  . TONSILLECTOMY    . TUBAL LIGATION    . UPPER GASTROINTESTINAL ENDOSCOPY  06/23/2008   w/Dil, Barrett's esophagus  . US ECHOCARDIOGRAPHY  01/21/2007   EF 55-60%  . US ECHOCARDIOGRAPHY  08/30/2003   EF 55-60%   Social History   Social History Narrative   Right handed   Two story home   She retired on disability   Married   2 Children   Immunization History  Administered Date(s) Administered  . Hepatitis B, adult 04/21/2017  . Influenza Split 10/02/2011  . Influenza Whole 08/21/2007  . Influenza, High Dose Seasonal PF 12/29/2017  . Influenza,inj,Quad PF,6+ Mos 12/15/2013, 11/23/2014, 09/27/2015, 10/04/2016  . Influenza-Unspecified 12/15/2013, 11/23/2014, 09/27/2015, 10/04/2016  . PFIZER(Purple Top)SARS-COV-2 Vaccination 03/31/2019, 04/20/2019  . Pneumococcal Conjugate-13 09/05/2014  . Pneumococcal Polysaccharide-23 10/02/2011, 02/08/2013  . Td 01/14/2006  . Tdap 01/22/2016     Objective: Vital Signs: BP 107/70 (BP Location: Left Arm, Patient Position: Sitting, Cuff  Size: Normal)   Pulse 91   Resp 13   Ht 5\' 5"  (1.651 m)   Wt 107 lb 9.6 oz (48.8 kg)   BMI 17.91 kg/m    Physical Exam Vitals and nursing note reviewed.  Constitutional:      Appearance: She is well-developed.  HENT:     Head: Normocephalic and atraumatic.  Eyes:     Conjunctiva/sclera: Conjunctivae normal.  Cardiovascular:     Rate and Rhythm: Normal rate and regular rhythm.     Heart sounds: Normal heart sounds.  Pulmonary:     Effort: Pulmonary effort is normal.     Breath sounds: Normal breath sounds.  Abdominal:     General: Bowel sounds are normal.     Palpations: Abdomen is soft.  Musculoskeletal:     Cervical back: Normal range of motion.  Lymphadenopathy:     Cervical: No cervical adenopathy.  Skin:    General: Skin is warm and dry.     Capillary Refill: Capillary refill takes less than 2 seconds.  Neurological:     Mental Status: She is alert and oriented to person, place, and time.  Psychiatric:        Behavior: Behavior normal.      Musculoskeletal Exam: She had discomfort range of motion of her cervical lumbar spine.  Shoulder joints, elbow joints, wrist joints with good range of motion.  She had bilateral PIP and DIP thickening with no synovitis.  Hip joints, knee joints were in good range of motion.  There was no tenderness over ankles or MTPs.  She had generalized hyperalgesia and positive tender points.  CDAI Exam: CDAI Score: -- Patient Global: --; Provider Global: -- Swollen: --; Tender: -- Joint Exam 06/05/2020   No joint exam has been documented for this visit   There is currently no information documented on the homunculus. Go to the Rheumatology activity and complete the homunculus joint exam.  Investigation: No additional findings.  Imaging: No results found.  Recent Labs: Lab Results  Component Value Date   WBC 8.9 05/05/2020   HGB 11.6 (L) 05/05/2020   PLT 297 05/05/2020   NA 141 05/05/2020   K 4.6 05/05/2020   CL 104  05/05/2020   CO2  27 05/05/2020   GLUCOSE 81 05/05/2020   BUN 39 (H) 05/05/2020   CREATININE 0.73 05/05/2020   BILITOT 0.5 05/05/2020   ALKPHOS 72 07/06/2018   AST 24 05/05/2020   ALT 22 05/05/2020   PROT 6.3 05/05/2020   PROT 6.4 05/05/2020   ALBUMIN 4.4 07/06/2018   CALCIUM 9.3 05/05/2020   GFRAA 95 05/05/2020   May 05, 2020 IFE negative, PTH normal, vitamin D 35, phosphorus 4.1, TSH normal  Speciality Comments: Fosamax 2013-2018 Patient declined Tymlos and Forteo-travels frequently.  Procedures:  No procedures performed Allergies: Ambien [zolpidem tartrate], Codeine, Oxycodone-acetaminophen, Tylox [oxycodone-acetaminophen], Darvocet [propoxyphene n-acetaminophen], Darvon [propoxyphene], Lorcet [hydrocodone-acetaminophen], Opium, Vicodin [hydrocodone-acetaminophen], Wheat bran, Amoxicillin, and Penicillins   Assessment / Plan:     Visit Diagnoses: Age-related osteoporosis without current pathological fracture - DEXA on 01/11/20: BMD measured at AP spine L1-L4 is 0.723 with a T-score of -3.8.Prolia 05/26/20.  She tolerated Prolia well.  Use of calcium, vitamin D and resistive exercises was discussed.  Need for resistive exercises were discussed.  Medication management-her labs have been stable we will continue to monitor labs every 6 months.  DDD (degenerative disc disease), cervical-she has limited range of motion with discomfort.  I have referred her to physical therapy for evaluation.  DDD (degenerative disc disease), lumbar-she continues to have pain and discomfort in her lower back.  Physical therapy referral was made.  Chronic pain syndrome-she is on multiple medication for pain management.  Neuropathic pain-she is on Cymbalta and Lyrica which helps her to some extent.  Fibromyalgia-she continues to have some generalized pain, positive tender points and hyperalgesia.  Anxiety and depression  IC (interstitial cystitis)  History of gastroesophageal reflux  (GERD)  Malnutrition, unspecified type ([place])  Hypoglycemia  Orders: No orders of the defined types were placed in this encounter.  No orders of the defined types were placed in this encounter.    Follow-Up Instructions: Return in about 6 months (around 12/06/2020) for Osteoarthritis, Osteoporosis.   Bo Merino, MD  Note - This record has been created using Editor, commissioning.  Chart creation errors have been sought, but may not always  have been located. Such creation errors do not reflect on  the standard of medical care.

## 2020-05-24 ENCOUNTER — Telehealth: Payer: Self-pay

## 2020-05-24 ENCOUNTER — Telehealth: Payer: Self-pay | Admitting: *Deleted

## 2020-05-24 NOTE — Telephone Encounter (Signed)
First post procedure follow up call, no answer 

## 2020-05-24 NOTE — Telephone Encounter (Signed)
  Follow up Call-  Call back number 05/22/2020 10/22/2018  Post procedure Call Back phone  # [phone]  Permission to leave phone message Yes Yes  Some recent data might be hidden     Patient questions:  Do you have a fever, pain , or abdominal swelling? No. Pain Score  0 *  Have you tolerated food without any problems? Yes.    Have you been able to return to your normal activities? Yes.    Do you have any questions about your discharge instructions: Diet   No. Medications  No. Follow up visit  No.  Do you have questions or concerns about your Care? No.  Actions: * If pain score is 4 or above: No action needed, pain <4.  1. Have you developed a fever since your procedure? no  2.   Have you had an respiratory symptoms (SOB or cough) since your procedure? no  3.   Have you tested positive for COVID 19 since your procedure no  4.   Have you had any family members/close contacts diagnosed with the COVID 19 since your procedure?  no   If yes to any of these questions please route to Joylene John, RN and Joella Prince, RN

## 2020-05-25 ENCOUNTER — Encounter: Payer: Medicare Other | Admitting: Internal Medicine

## 2020-05-25 DIAGNOSIS — R350 Frequency of micturition: Secondary | ICD-10-CM | POA: Diagnosis not present

## 2020-05-25 DIAGNOSIS — N3941 Urge incontinence: Secondary | ICD-10-CM | POA: Diagnosis not present

## 2020-05-26 ENCOUNTER — Ambulatory Visit (HOSPITAL_COMMUNITY)
Admission: RE | Admit: 2020-05-26 | Discharge: 2020-05-26 | Disposition: A | Discharge status: Home / Self Care | Payer: Medicare Other | Source: Ambulatory Visit | Attending: Rheumatology | Admitting: Rheumatology

## 2020-05-26 ENCOUNTER — Other Ambulatory Visit: Payer: Self-pay

## 2020-05-26 DIAGNOSIS — M810 Age-related osteoporosis without current pathological fracture: Secondary | ICD-10-CM | POA: Diagnosis not present

## 2020-05-26 MED ORDER — DENOSUMAB 60 MG/ML ~~LOC~~ SOSY
60.0000 mg | PREFILLED_SYRINGE | Freq: Once | SUBCUTANEOUS | Status: AC
  Administered 2020-05-26: 60 mg via SUBCUTANEOUS

## 2020-05-26 MED ORDER — DENOSUMAB 60 MG/ML ~~LOC~~ SOSY
PREFILLED_SYRINGE | SUBCUTANEOUS | Status: AC
  Filled 2020-05-26: qty 1

## 2020-05-26 NOTE — Discharge Instructions (Signed)
Denosumab injection What is this medicine? DENOSUMAB (den oh sue mab) slows bone breakdown. Prolia is used to treat osteoporosis in women after menopause and in men, and in people who are taking corticosteroids for 6 months or more. Xgeva is used to treat a high calcium level due to cancer and to prevent bone fractures and other bone problems caused by multiple myeloma or cancer bone metastases. Xgeva is also used to treat giant cell tumor of the bone. This medicine may be used for other purposes; ask your health care provider or pharmacist if you have questions. COMMON BRAND NAME(S): Prolia, XGEVA What should I tell my health care provider before I take this medicine? They need to know if you have any of these conditions:  dental disease  having surgery or tooth extraction  infection  kidney disease  low levels of calcium or Vitamin D in the blood  malnutrition  on hemodialysis  skin conditions or sensitivity  thyroid or parathyroid disease  an unusual reaction to denosumab, other medicines, foods, dyes, or preservatives  pregnant or trying to get pregnant  breast-feeding How should I use this medicine? This medicine is for injection under the skin. It is given by a health care professional in a hospital or clinic setting. A special MedGuide will be given to you before each treatment. Be sure to read this information carefully each time. For Prolia, talk to your pediatrician regarding the use of this medicine in children. Special care may be needed. For Xgeva, talk to your pediatrician regarding the use of this medicine in children. While this drug may be prescribed for children as young as 13 years for selected conditions, precautions do apply. Overdosage: If you think you have taken too much of this medicine contact a poison control center or emergency room at once. NOTE: This medicine is only for you. Do not share this medicine with others. What if I miss a dose? It is  important not to miss your dose. Call your doctor or health care professional if you are unable to keep an appointment. What may interact with this medicine? Do not take this medicine with any of the following medications:  other medicines containing denosumab This medicine may also interact with the following medications:  medicines that lower your chance of fighting infection  steroid medicines like prednisone or cortisone This list may not describe all possible interactions. Give your health care provider a list of all the medicines, herbs, non-prescription drugs, or dietary supplements you use. Also tell them if you smoke, drink alcohol, or use illegal drugs. Some items may interact with your medicine. What should I watch for while using this medicine? Visit your doctor or health care professional for regular checks on your progress. Your doctor or health care professional may order blood tests and other tests to see how you are doing. Call your doctor or health care professional for advice if you get a fever, chills or sore throat, or other symptoms of a cold or flu. Do not treat yourself. This drug may decrease your body's ability to fight infection. Try to avoid being around people who are sick. You should make sure you get enough calcium and vitamin D while you are taking this medicine, unless your doctor tells you not to. Discuss the foods you eat and the vitamins you take with your health care professional. See your dentist regularly. Brush and floss your teeth as directed. Before you have any dental work done, tell your dentist you are   receiving this medicine. Do not become pregnant while taking this medicine or for 5 months after stopping it. Talk with your doctor or health care professional about your birth control options while taking this medicine. Women should inform their doctor if they wish to become pregnant or think they might be pregnant. There is a potential for serious side  effects to an unborn child. Talk to your health care professional or pharmacist for more information. What side effects may I notice from receiving this medicine? Side effects that you should report to your doctor or health care professional as soon as possible:  allergic reactions like skin rash, itching or hives, swelling of the face, lips, or tongue  bone pain  breathing problems  dizziness  jaw pain, especially after dental work  redness, blistering, peeling of the skin  signs and symptoms of infection like fever or chills; cough; sore throat; pain or trouble passing urine  signs of low calcium like fast heartbeat, muscle cramps or muscle pain; pain, tingling, numbness in the hands or feet; seizures  unusual bleeding or bruising  unusually weak or tired Side effects that usually do not require medical attention (report to your doctor or health care professional if they continue or are bothersome):  constipation  diarrhea  headache  joint pain  loss of appetite  muscle pain  runny nose  tiredness  upset stomach This list may not describe all possible side effects. Call your doctor for medical advice about side effects. You may report side effects to FDA at 1-800-FDA-1088. Where should I keep my medicine? This medicine is only given in a clinic, doctor's office, or other health care setting and will not be stored at home. NOTE: This sheet is a summary. It may not cover all possible information. If you have questions about this medicine, talk to your doctor, pharmacist, or health care provider.  2021 Elsevier/Gold Standard (2017-05-09 16:10:44)

## 2020-05-30 DIAGNOSIS — K279 Peptic ulcer, site unspecified, unspecified as acute or chronic, without hemorrhage or perforation: Secondary | ICD-10-CM | POA: Diagnosis not present

## 2020-05-30 DIAGNOSIS — R32 Unspecified urinary incontinence: Secondary | ICD-10-CM | POA: Diagnosis not present

## 2020-05-30 DIAGNOSIS — E11649 Type 2 diabetes mellitus with hypoglycemia without coma: Secondary | ICD-10-CM | POA: Diagnosis not present

## 2020-05-30 DIAGNOSIS — G609 Hereditary and idiopathic neuropathy, unspecified: Secondary | ICD-10-CM | POA: Diagnosis not present

## 2020-05-30 DIAGNOSIS — K588 Other irritable bowel syndrome: Secondary | ICD-10-CM | POA: Diagnosis not present

## 2020-05-30 DIAGNOSIS — E1121 Type 2 diabetes mellitus with diabetic nephropathy: Secondary | ICD-10-CM | POA: Diagnosis not present

## 2020-05-30 DIAGNOSIS — M47816 Spondylosis without myelopathy or radiculopathy, lumbar region: Secondary | ICD-10-CM | POA: Diagnosis not present

## 2020-05-30 DIAGNOSIS — E43 Unspecified severe protein-calorie malnutrition: Secondary | ICD-10-CM | POA: Diagnosis not present

## 2020-05-30 DIAGNOSIS — M150 Primary generalized (osteo)arthritis: Secondary | ICD-10-CM | POA: Diagnosis not present

## 2020-05-30 DIAGNOSIS — K22719 Barrett's esophagus with dysplasia, unspecified: Secondary | ICD-10-CM | POA: Diagnosis not present

## 2020-05-30 DIAGNOSIS — G43001 Migraine without aura, not intractable, with status migrainosus: Secondary | ICD-10-CM | POA: Diagnosis not present

## 2020-05-30 DIAGNOSIS — K5900 Constipation, unspecified: Secondary | ICD-10-CM | POA: Diagnosis not present

## 2020-06-01 DIAGNOSIS — R350 Frequency of micturition: Secondary | ICD-10-CM | POA: Diagnosis not present

## 2020-06-01 DIAGNOSIS — N3941 Urge incontinence: Secondary | ICD-10-CM | POA: Diagnosis not present

## 2020-06-05 ENCOUNTER — Encounter: Payer: Self-pay | Admitting: Rheumatology

## 2020-06-05 ENCOUNTER — Other Ambulatory Visit: Payer: Self-pay

## 2020-06-05 ENCOUNTER — Ambulatory Visit (INDEPENDENT_AMBULATORY_CARE_PROVIDER_SITE_OTHER): Payer: Medicare Other | Admitting: Rheumatology

## 2020-06-05 VITALS — BP 107/70 | HR 91 | Resp 13 | Ht 65.0 in | Wt 107.6 lb

## 2020-06-05 DIAGNOSIS — M5030 Other cervical disc degeneration, unspecified cervical region: Secondary | ICD-10-CM

## 2020-06-05 DIAGNOSIS — G894 Chronic pain syndrome: Secondary | ICD-10-CM | POA: Diagnosis not present

## 2020-06-05 DIAGNOSIS — E162 Hypoglycemia, unspecified: Secondary | ICD-10-CM | POA: Diagnosis not present

## 2020-06-05 DIAGNOSIS — M5136 Other intervertebral disc degeneration, lumbar region: Secondary | ICD-10-CM | POA: Diagnosis not present

## 2020-06-05 DIAGNOSIS — M792 Neuralgia and neuritis, unspecified: Secondary | ICD-10-CM | POA: Diagnosis not present

## 2020-06-05 DIAGNOSIS — Z8719 Personal history of other diseases of the digestive system: Secondary | ICD-10-CM

## 2020-06-05 DIAGNOSIS — E46 Unspecified protein-calorie malnutrition: Secondary | ICD-10-CM | POA: Diagnosis not present

## 2020-06-05 DIAGNOSIS — Z79899 Other long term (current) drug therapy: Secondary | ICD-10-CM

## 2020-06-05 DIAGNOSIS — M797 Fibromyalgia: Secondary | ICD-10-CM

## 2020-06-05 DIAGNOSIS — F419 Anxiety disorder, unspecified: Secondary | ICD-10-CM | POA: Diagnosis not present

## 2020-06-05 DIAGNOSIS — N3010 Interstitial cystitis (chronic) without hematuria: Secondary | ICD-10-CM

## 2020-06-05 DIAGNOSIS — M810 Age-related osteoporosis without current pathological fracture: Secondary | ICD-10-CM | POA: Diagnosis not present

## 2020-06-05 DIAGNOSIS — F32A Depression, unspecified: Secondary | ICD-10-CM

## 2020-06-08 DIAGNOSIS — N3941 Urge incontinence: Secondary | ICD-10-CM | POA: Diagnosis not present

## 2020-06-08 DIAGNOSIS — R350 Frequency of micturition: Secondary | ICD-10-CM | POA: Diagnosis not present

## 2020-06-14 ENCOUNTER — Telehealth: Payer: Self-pay | Admitting: Cardiovascular Disease

## 2020-06-14 DIAGNOSIS — R072 Precordial pain: Secondary | ICD-10-CM

## 2020-06-14 NOTE — Telephone Encounter (Signed)
Pt c/o of Chest Pain: STAT if CP now or developed within 24 hours  1. Are you having CP right now? Angina-not at this time-  little sharp pain in her chest, feel like needles this time  2. Are you experiencing any other symptoms (ex. SOB, nausea, vomiting, sweating)? no  3. How long have you been experiencing CP? For the past 2 weeks  4. Is your CP continuous or coming and going? Comes and goes  5. Have you taken Nitroglycerin? Nnever had any- pt wants to come in for an ekg?

## 2020-06-14 NOTE — Telephone Encounter (Signed)
RN returned call to patient who states for about four weeks now she has been experiencing angina on occasion that goes away after a while. Patient reports a significant amount of stress related to having to travel to [place] during the week to take care of her mother-in law. Patient states she was taking propranolol as needed a few years back but it was stopped. She has PRN temazepam to help with anxiety when needed. Patient states she was going to take one to see if that helped her nerves. Patient reports chest pain only with increased anxiety. She denies any other symptoms. Patient contacted PCP, but was instructed to contact cardiology for follow-up. Patient denies current angina but wanted to make Dr. Acie Fredrickson aware. RN advised I would send a message to Dr. Acie Fredrickson for advice. Rn instructed patient to contact the office if symptoms return or worsen and advised of ED precautions. Patient verbalized understanding.

## 2020-06-15 DIAGNOSIS — N3941 Urge incontinence: Secondary | ICD-10-CM | POA: Diagnosis not present

## 2020-06-15 NOTE — Telephone Encounter (Signed)
Kayla [name] has had CP for years. We have done multiple myoview studies and I think a cath years ago Please order a lexiscan myoview Thanks  Abbe Amsterdam

## 2020-06-16 NOTE — Telephone Encounter (Signed)
Left message to call back  

## 2020-06-22 ENCOUNTER — Telehealth: Payer: Self-pay | Admitting: Physical Therapy

## 2020-06-22 ENCOUNTER — Ambulatory Visit: Payer: Medicare Other | Admitting: Physical Therapy

## 2020-06-22 NOTE — Telephone Encounter (Signed)
Able to reach patient, she is having to go out of town on a family emergency.  I told her she will be called for a reschedule.

## 2020-06-22 NOTE — Telephone Encounter (Signed)
Called patient to state we are having a plumbing issue and will need to reschedule evaluation. Voice mail left and told someone would call to reschedule.

## 2020-06-26 NOTE — Telephone Encounter (Signed)
Message left updating patient on Dr. Elmarie Shiley recommendation for Myoview. Directions sent through mychart.

## 2020-06-27 NOTE — Op Note (Signed)
[place] Patient Name: Shemeika Starzyk Procedure Date: 05/22/2020 3:27 PM MRN: [id number] Endoscopist: Gatha Mayer , MD Age: 75 Referring MD:  Date of Birth: 1945/06/14 Gender: Female Account #: [id number] Procedure:                Upper GI endoscopy Indications:              Dysphagia Medicines:                Propofol per Anesthesia, Monitored Anesthesia Care Procedure:                Pre-Anesthesia Assessment:                           - Prior to the procedure, a History and Physical                            was performed, and patient medications and                            allergies were reviewed. The patient's tolerance of                            previous anesthesia was also reviewed. The risks                            and benefits of the procedure and the sedation                            options and risks were discussed with the patient.                            All questions were answered, and informed consent                            was obtained. Prior Anticoagulants: The patient has                            taken no previous anticoagulant or antiplatelet                            agents. ASA Grade Assessment: II - A patient with                            mild systemic disease. After reviewing the risks                            and benefits, the patient was deemed in                            satisfactory condition to undergo the procedure.                           After obtaining informed consent, the endoscope was  passed under direct vision. Throughout the                            procedure, the patient's blood pressure, pulse, and                            oxygen saturations were monitored continuously. The                            Endoscope was introduced through the anus and                            advanced to the second part of duodenum. After                            obtaining informed consent,  the endoscope was                            passed under direct vision. Throughout the                            procedure, the patient's blood pressure, pulse, and                            oxygen saturations were monitored continuously. The                            upper GI endoscopy was accomplished without                            difficulty. The patient tolerated the procedure                            well. Scope In: Scope Out: Findings:                 No endoscopic abnormality was evident in the                            esophagus to explain the patient's complaint of                            dysphagia. It was decided, however, to proceed with                            dilation of the entire esophagus. The scope was                            withdrawn. Dilation was performed with a Maloney                            dilator with mild resistance at 55 Fr. The dilation                            site  was examined following endoscope reinsertion                            and showed no change. Estimated blood loss: none.                           Evidence of a gastrojejunostomy was found in the                            stomach. This was characterized by healthy                            appearing mucosa.                           The examined jejunum was normal. Complications:            No immediate complications. Estimated Blood Loss:     Estimated blood loss: none. Impression:               - No endoscopic esophageal abnormality to explain                            patient's dysphagia. Esophagus dilated. Dilated.                           - A gastrojejunostomy was found, characterized by                            healthy appearing mucosa.                           - Normal examined jejunum.                           - No specimens collected. Recommendation:           - Patient has a contact number available for                            emergencies. The signs  and symptoms of potential                            delayed complications were discussed with the                            patient. Return to normal activities tomorrow.                            Written discharge instructions were provided to the                            patient.                           - Clear liquids x 1 hour then soft foods rest of  day. Start prior diet tomorrow.                           - Continue present medications.                           - Failed colon prep - will recshedule w/ double prep Gatha Mayer, MD 06/27/2020 12:06:20 PM This report has been signed electronically.

## 2020-06-28 ENCOUNTER — Telehealth (HOSPITAL_COMMUNITY): Payer: Self-pay | Admitting: *Deleted

## 2020-06-28 NOTE — Telephone Encounter (Signed)
Patient given detailed instructions per Myocardial Perfusion Study Information Sheet for the test on 07/07/20 at 7:15. Patient notified to arrive 15 minutes early and that it is imperative to arrive on time for appointment to keep from having the test rescheduled.  If you need to cancel or reschedule your appointment, please call the office within 24 hours of your appointment. . Patient verbalized understanding.Kayla [name]   

## 2020-07-07 ENCOUNTER — Encounter (HOSPITAL_COMMUNITY): Payer: Self-pay

## 2020-07-07 ENCOUNTER — Encounter (HOSPITAL_COMMUNITY): Payer: Medicare Other

## 2020-07-13 DIAGNOSIS — R350 Frequency of micturition: Secondary | ICD-10-CM | POA: Diagnosis not present

## 2020-07-13 DIAGNOSIS — N3941 Urge incontinence: Secondary | ICD-10-CM | POA: Diagnosis not present

## 2020-07-20 ENCOUNTER — Other Ambulatory Visit: Payer: Self-pay

## 2020-07-20 ENCOUNTER — Telehealth: Payer: Self-pay | Admitting: Rheumatology

## 2020-07-20 ENCOUNTER — Ambulatory Visit (HOSPITAL_COMMUNITY): Payer: Medicare Other | Attending: Cardiovascular Disease

## 2020-07-20 DIAGNOSIS — R072 Precordial pain: Secondary | ICD-10-CM | POA: Insufficient documentation

## 2020-07-20 LAB — MYOCARDIAL PERFUSION IMAGING
LV dias vol: 57 mL (ref 46–106)
LV sys vol: 20 mL
Peak HR: 100 {beats}/min
Rest HR: 85 {beats}/min
SDS: 0
SRS: 0
SSS: 0
TID: 1.03

## 2020-07-20 MED ORDER — TECHNETIUM TC 99M TETROFOSMIN IV KIT
31.6000 | PACK | Freq: Once | INTRAVENOUS | Status: AC | PRN
  Administered 2020-07-20: 31.6 via INTRAVENOUS
  Filled 2020-07-20: qty 32

## 2020-07-20 MED ORDER — REGADENOSON 0.4 MG/5ML IV SOLN
0.4000 mg | Freq: Once | INTRAVENOUS | Status: AC
  Administered 2020-07-20: 0.4 mg via INTRAVENOUS

## 2020-07-20 MED ORDER — TECHNETIUM TC 99M TETROFOSMIN IV KIT
10.2000 | PACK | Freq: Once | INTRAVENOUS | Status: AC | PRN
Start: 2020-07-20 — End: 2020-07-20
  Administered 2020-07-20: 10.2 via INTRAVENOUS
  Filled 2020-07-20: qty 11

## 2020-07-20 NOTE — Telephone Encounter (Signed)
Patient returned your call. Please call when available.

## 2020-07-21 NOTE — Telephone Encounter (Signed)
Pharmacy team did not reach out to patient. I don't see any labs that were drawn or resulted recently.  Returned call to patient regarding this, but unable to reach.   She is not due for Prolia until December 2022.  Knox Saliva, PharmD, MPH Clinical Pharmacist (Rheumatology and Pulmonology)

## 2020-08-03 ENCOUNTER — Other Ambulatory Visit: Payer: Self-pay

## 2020-08-03 ENCOUNTER — Ambulatory Visit (AMBULATORY_SURGERY_CENTER): Payer: Self-pay | Admitting: *Deleted

## 2020-08-03 VITALS — Ht 65.0 in | Wt 106.0 lb

## 2020-08-03 DIAGNOSIS — N3941 Urge incontinence: Secondary | ICD-10-CM | POA: Diagnosis not present

## 2020-08-03 DIAGNOSIS — Z86010 Personal history of colonic polyps: Secondary | ICD-10-CM

## 2020-08-03 MED ORDER — PLENVU 140 G PO SOLR: 1.0000 | ORAL | 0 refills | Status: DC

## 2020-08-03 NOTE — Progress Notes (Signed)
No egg or soy allergy known to patient  No issues with past sedation with any surgeries or procedures Patient denies ever being told they had issues or difficulty with intubation  No FH of Malignant Hyperthermia No diet pills per patient No home 02 use per patient  No blood thinners per patient  Pt states  issues with constipation - last colon 06-2020 poor prep- pt asked to do 2 days of clears and a double prep- 2 day prep per Dr Carlean Purl  No A fib or A flutter  EMMI video to pt or via [place] 19 guidelines implemented in [place] today with Pt and RN  Pt is fully vaccinated  for Covid     Due to the COVID-19 pandemic we are asking patients to follow certain guidelines.  Pt aware of COVID protocols and [facility] guidelines  Pt states she is having difficulty swallowing still despite dilation 06-27-2020- she chokes on foods, has to make herself vomit  She is asking for a gut bacteria test repeat as well

## 2020-08-07 ENCOUNTER — Other Ambulatory Visit: Payer: Self-pay

## 2020-08-07 ENCOUNTER — Ambulatory Visit (HOSPITAL_COMMUNITY)
Admission: EM | Admit: 2020-08-07 | Discharge: 2020-08-07 | Disposition: A | Discharge status: Home / Self Care | Payer: Medicare Other | Attending: Internal Medicine | Admitting: Internal Medicine

## 2020-08-07 ENCOUNTER — Encounter (HOSPITAL_COMMUNITY): Payer: Self-pay | Admitting: *Deleted

## 2020-08-07 DIAGNOSIS — R3915 Urgency of urination: Secondary | ICD-10-CM

## 2020-08-07 DIAGNOSIS — R310 Gross hematuria: Secondary | ICD-10-CM | POA: Diagnosis not present

## 2020-08-07 DIAGNOSIS — R350 Frequency of micturition: Secondary | ICD-10-CM | POA: Diagnosis not present

## 2020-08-07 DIAGNOSIS — R300 Dysuria: Secondary | ICD-10-CM

## 2020-08-07 LAB — POCT URINALYSIS DIPSTICK, ED / UC
Bilirubin Urine: NEGATIVE
Glucose, UA: NEGATIVE mg/dL
Hgb urine dipstick: NEGATIVE
Ketones, ur: NEGATIVE mg/dL
Nitrite: POSITIVE — AB
Protein, ur: NEGATIVE mg/dL
Specific Gravity, Urine: 1.02 (ref 1.005–1.030)
Urobilinogen, UA: 2 mg/dL — ABNORMAL HIGH (ref 0.0–1.0)
pH: 7.5 (ref 5.0–8.0)

## 2020-08-07 MED ORDER — CIPROFLOXACIN HCL 500 MG PO TABS: 500.0000 mg | ORAL_TABLET | Freq: Two times a day (BID) | ORAL | 0 refills | Status: AC

## 2020-08-07 NOTE — ED Provider Notes (Signed)
[place]    CSN: [id number] Arrival date & time: 08/07/20  1059      History   Chief Complaint Chief Complaint  Patient presents with   UTI   fequency   urinary incontence    HPI Kayla [name] is a 75 y.o. female.   75 year old female with multiple health issues presents with urinary discomfort and pain that started 4 days ago. Also has seen some frank blood and clots in her urine and has had increased frequency and urgency. Denies any fever. Has noticed lower abdominal swelling, nausea and more bilateral back pain recently. Has been taking AZO with some relief. Has history of recurrent UTI and kidney issues (calculi) but last UTI was over 5 years ago. Has seen a Urologist in the past and her PCP is on vacation this week. Other chronic health issues include GERD, irritable bowel and chronic intestinal issues, arthritis, chronic pain, fibromyalgia, migraine headaches, type 2 DM with hypoglycemia, angina, rosacea, osteoporosis, anxiety and depression. Currently on Celebrex, Flexeril, Voltaren gel, Cymbalta, Lyrica, Fentanyl patch, Lidoderm patch, Nexium, Carafate, Myrbetriq, Metrogel topical, various eye drops, vitamins and Movantik daily, Vit B12 and Emgality monthly and Flurbiprofen, Excedrin Migraine, Klonopin, Bentyl, Norco, Zofran, and Imitrex prn.   The history is provided by the patient.   Past Medical History:  Diagnosis Date   Allergy    Anal fissure 10/08/2016   Anemia    anemia in the past   Angina    takes Propanolol prn and it stops her chest   Anxiety    Barrett esophagus    not consistently present   Cataract    removed  but had retinal detachment - repeat cataract right eye   Chronic kidney disease    kidney stones and UTI   Complication of anesthesia    either  BP or pulse dropped with last surgery   DDD (degenerative disc disease)    cervical and lumbar   Dementia ([place])    Depression    post traumatic stress  disorder   Diabetes ([place])  11/18/2016   Diabetes mellitus    sugar goes up and down diet controlled   Dysrhythmia    brought on by stress   External hemorrhoids    Fatigue    Fibromyalgia    neuropathy knees ankles and toes, bladder   GERD (gastroesophageal reflux disease)    H/O hyperparathyroidism    Headache(784.0)    migraines   Heart murmur    Hiatal hernia    History of kidney stones    Hypertension    3 small brain aneurysms   Hypoglycemia 11/08/2016   IBS (irritable bowel syndrome)    Internal hemorrhoids    Macular degeneration    Migraines    Mitral regurgitation 12/11/2015   Osteoarthritis    all over   Osteopetrosis    Peripheral vascular disease ([place])    superficial phlebitis in left calf   Personal history of colonic polyps 07/22/2013   07/2013 - 3 small adenomas - repeat colonoscopy 2018   Small intestinal bacterial overgrowth 08/05/2017   + lactulose breath test 06/2017 Xifaxan Rx    Patient Active Problem List   Diagnosis Date Noted   Epigastric pain 11/11/2019   Anorexia nervosa 11/11/2019   LLQ abdominal pain 11/11/2019   Malnutrition ([place]) 11/11/2019   Mechanical breakdown implanted electronic neurostimulator spinal cord ([place]) 11/16/2018   Chronic narcotic dependence ([place]) 10/03/2018   Esophageal dysphagia 10/03/2018   Neuropathic  pain 01/23/2018   MVP (mitral valve prolapse) 12/17/2017   Rosacea 11/02/2017   Chronic pain syndrome 11/02/2017   Small intestinal bacterial overgrowth 08/05/2017   Orthostatic hypotension 11/20/2016   Diabetes ([place]) 11/18/2016   Hypoglycemia 11/08/2016   Cervical facet joint syndrome 10/08/2016   Anal fissure 10/08/2016   History of genetic counseling 02/02/2016   Peripheral vascular disease ([place]) 01/22/2016   Prediabetes 01/22/2016   H/O hyperparathyroidism 01/22/2016   Mitral regurgitation 12/11/2015   H/O gastric bypass 02/11/2014   Status post bariatric surgery 02/11/2014   Chronic maxillary sinusitis 09/15/2013   Personal history  of colonic polyps 07/22/2013   HA (headache)-chronic/tension type 07/28/2012   Osteoporosis 07/28/2012   Nephrolithiasis 07/28/2012   Fibromyalgia 07/26/2012   Chronic interstitial cystitis 10/16/2011   Chronic constipation 10/04/2010   WEIGHT LOSS 11/27/2009   PERSONAL HISTORY OF FAILED MODERATE SEDATION 06/01/2008   Anxiety state 03/18/2007   Reactive depression 03/18/2007   GERD 03/18/2007   Diaphragmatic hernia 03/18/2007   Osteoarthritis 03/18/2007   [place] DISEASE, CERVICAL 03/18/2007   [place] DISEASE, LUMBAR 03/18/2007   Myalgia and myositis 03/18/2007    Past Surgical History:  Procedure Laterality Date   ABDOMINAL HYSTERECTOMY     APPENDECTOMY     CARDIAC CATHETERIZATION  03/22/1991   EF 61%   CARDIOVASCULAR STRESS TEST  10/03/2006   CHOLECYSTECTOMY     COLONOSCOPY  06/23/2008   normal   DILATION AND CURETTAGE OF UTERUS     x2   ESOPHAGUS SURGERY     EXPLORATORY LAPAROTOMY  1993   EYE SURGERY     GASTRIC BYPASS  1999   GASTRIC RESTRICTION SURGERY  1991   LASIK Bilateral    Right Arm Surgery     Right Knee Arthroscopy     Rt wrist fx  2009   spinal surgery battery implant     stomach stappeling  1991   STONE EXTRACTION WITH BASKET  2012   TONSILLECTOMY     TUBAL LIGATION     UPPER GASTROINTESTINAL ENDOSCOPY  06/23/2008   w/Dil, Barrett's esophagus   US ECHOCARDIOGRAPHY  01/21/2007   EF 55-60%   US ECHOCARDIOGRAPHY  08/30/2003   EF 55-60%    OB History   No obstetric history on file.      Home Medications    Prior to Admission medications   Medication Sig Start Date End Date Taking? Authorizing Provider  ciprofloxacin (CIPRO) 500 MG tablet Take 1 tablet (500 mg total) by mouth every 12 (twelve) hours for 7 days. 08/07/20 08/14/20 Yes Jermaine Neuharth, Nicholes Stairs, NP  acarbose (PRECOSE) 50 MG tablet TAKE 1 TABLET BY MOUTH 3 TIMES A DAY WITH MEALS Patient not taking: Reported on 08/03/2020 01/18/20   Renato Shin, MD  aspirin-acetaminophen-caffeine [facility] MIGRAINE)  [phone] MG tablet Take 2 tablets by mouth 2 (two) times daily.    [provider]  Blood Glucose Monitoring Suppl (FREESTYLE FREEDOM LITE) w/Device KIT 1 each by Does not apply route 4 (four) times daily. Use to monitor glucose levels 4 times daily; E11.9 08/03/18   Renato Shin, MD  CALCIUM PO Take by mouth daily.    [provider]  celecoxib (CELEBREX) 200 MG capsule Take 200 mg by mouth 2 (two) times daily.    [provider]  clonazePAM (KLONOPIN) 0.5 MG tablet Take 0.25 mg by mouth as needed.    [provider]  COLLAGEN PO Take by mouth daily.    [provider]  Continuous Blood Gluc  Receiver (FREESTYLE LIBRE 42 DAY READER) DEVI 1 each by Does not apply route See admin instructions. Use with sensors to monitor glucose levels; E11.9 08/03/18   Renato Shin, MD  Continuous Blood Gluc Sensor (FREESTYLE LIBRE 14 DAY SENSOR) MISC 1 Device by Does not apply route every 14 (fourteen) days. 02/17/20   Renato Shin, MD  cyanocobalamin (,VITAMIN B-12,) 1000 MCG/ML injection SMARTSIG:1 Vial(s) IM Once a Month 06/15/20   [provider]  cyclobenzaprine (FLEXERIL) 10 MG tablet Take 1 tablet (10 mg total) by mouth 3 (three) times daily as needed for muscle spasms. 90 day supply 10/16/18   Pieter Partridge, DO  cycloSPORINE (RESTASIS) 0.05 % ophthalmic emulsion Place 1 drop into both eyes 2 (two) times daily. 11/08/16   Shawnee Knapp, MD  diclofenac sodium (VOLTAREN) 1 % GEL Apply 2 g topically 4 (four) times daily. 10/20/18   Forrest Moron, MD  dicyclomine (BENTYL) 10 MG capsule Take 1-2 tab 3 times daily AC as needed for spasms and cramping. 11/10/19   Zehr, Laban Emperor, PA-C  DULoxetine (CYMBALTA) 30 MG capsule Take 30 mg by mouth 2 (two) times daily.    [provider]  esomeprazole (NEXIUM) 40 MG capsule Take 1 capsule (40 mg total) by mouth daily before breakfast. 11/10/19   Zehr, Janett Billow D, PA-C  fentaNYL (DURAGESIC) 25 MCG/HR 1 patch every 3  (three) days. 02/23/20   [provider]  flurbiprofen (ANSAID) 100 MG tablet Take 1 tablet (100 mg total) by mouth every 8 (eight) hours as needed (maximum 3 tablets in 24 hours). 07/01/18   Tomi Likens, Adam R, DO  Galcanezumab-gnlm (EMGALITY) 120 MG/ML SOAJ Inject 120 mg into the skin every 28 (twenty-eight) days. 03/16/19   Tomi Likens, Adam R, DO  glucagon 1 MG injection Inject 1 mg into the muscle once as needed for up to 1 dose. 05/12/19   Renato Shin, MD  glucose blood (FREESTYLE TEST STRIPS) test strip Use to monitor glucose levels 4 times daily; E11.9 08/03/18   Renato Shin, MD  HYDROcodone-acetaminophen [facility]) 10-325 MG tablet Take 1 tablet by mouth every 6 (six) hours as needed for moderate pain. 1/2 tablet as needed    [provider]  hypromellose (GENTEAL) 0.3 % GEL ophthalmic ointment Place 1 drop into both eyes at bedtime. Reported on 02/11/2015    [provider]  Lancets (FREESTYLE) lancets Use to monitor glucose levels 4 times daily; E11.9 08/03/18   Renato Shin, MD  lidocaine (LIDODERM) 5 % Place 1 patch onto the skin as needed. Remove & Discard patch within 12 hours. May dispense as 3 month supply 10/20/18   Forrest Moron, MD  Lifitegrast 5 % SOLN Place 1 drop into both eyes daily.    [provider]  metroNIDAZOLE (METROGEL) 1 % gel Apply topically daily. 10/20/18   Forrest Moron, MD  mirabegron ER (MYRBETRIQ) 25 MG TB24 tablet Take 1 tablet (25 mg total) by mouth daily. 10/20/18   Forrest Moron, MD  Multiple Vitamins-Minerals (PRESERVISION AREDS) CAPS Take 1 capsule by mouth 2 (two) times daily.     [provider]  naloxegol oxalate (MOVANTIK) 25 MG TABS tablet Take 25 mg by mouth daily.    [provider]  Naloxone HCl 0.4 MG/0.4ML SOAJ Administer 0.36m [place] at first sign of opioid overdose and repeat every 2 minutes as needed for resuscitation. Call 911 immediately 03/20/16   SShawnee Knapp MD  NON [place]  Peripheral Neuropathy  Cream- Bupivacaine 1%, Doxepin 3%, Gabapentin 6%, Pentoxifylline 3%, Topiramate 1% Apply 1-2 grams to affected area 3-4 times daily Qty. 120 gm 3 refills    [provider]  ondansetron (ZOFRAN) 8 MG tablet Take 1 tablet (8 mg total) by mouth every 8 (eight) hours as needed for nausea or vomiting. 10/20/18   Forrest Moron, MD  PEG-KCl-NaCl-NaSulf-Na Asc-C (PLENVU) 140 g SOLR Take 1 kit by mouth as directed. 08/03/20   Gatha Mayer, MD  pregabalin (LYRICA) 50 MG capsule Take 1 capsule (50 mg total) by mouth 2 (two) times daily. 12/13/19   Pieter Partridge, DO  Probiotic Product (PROBIOTIC-10 PO) Take by mouth.     [provider]  sucralfate (CARAFATE) 1 GM/10ML suspension Take 10 mLs (1 g total) by mouth 4 (four) times daily. 11/10/19   Zehr, Laban Emperor, PA-C  SUMAtriptan (IMITREX) 100 MG tablet Take 1 tablet at earliest onset of headache, may repeat in 2 hours if headache persists or reoccurs. 12/13/19   Pieter Partridge, DO  Vitamin D, Ergocalciferol, (DRISDOL) 1.25 MG (50000 UT) CAPS capsule Take 1 capsule (50,000 Units total) by mouth every 7 (seven) days. 10/20/18   Forrest Moron, MD    Family History Family History  Problem Relation Age of Onset   Stroke Mother    Lung cancer Mother    Heart disease Mother    Diabetes Mother    Hypertension Father    Stroke Father    Heart disease Father    Kidney disease Father    Seizures Father        epilepsy, and sisters x 2   Lung cancer Sister    Breast cancer Sister    Pancreatic cancer Sister    Colon cancer Maternal Aunt        12 relatives   Colon cancer Paternal Aunt    Colon cancer Paternal 35    Healthy Daughter    Healthy Son    Uterine cancer Other        aunt   Heart disease Other        grandmother   Esophageal cancer Neg Hx    Rectal cancer Neg Hx    Stomach cancer Neg Hx    Colon polyps Neg Hx     Social History Social History   Tobacco Use   Smoking status: Never    Smokeless tobacco: Never  Vaping Use   Vaping Use: Never used  Substance Use Topics   Alcohol use: No   Drug use: No     Allergies   Ambien [zolpidem tartrate], Codeine, Oxycodone-acetaminophen, Tylox [oxycodone-acetaminophen], Darvocet [propoxyphene n-acetaminophen], Darvon [propoxyphene], Lorcet [hydrocodone-acetaminophen], Opium, Vicodin [hydrocodone-acetaminophen], Wheat bran, Amoxicillin, and Penicillins   Review of Systems Review of Systems  Constitutional:  Positive for appetite change and fatigue. Negative for chills and fever.  Respiratory:  Negative for cough, chest tightness and shortness of breath.   Gastrointestinal:  Positive for abdominal pain and nausea.  Genitourinary:  Positive for decreased urine volume, dysuria, flank pain, frequency, hematuria and urgency. Negative for vaginal discharge.  Musculoskeletal:  Positive for back pain and myalgias.  Skin:  Negative for color change and rash.  Allergic/Immunologic: Positive for food allergies.  Neurological:  Positive for headaches. Negative for seizures, syncope, facial asymmetry and light-headedness.  Hematological:  Negative for adenopathy. Does not bruise/bleed easily.    Physical Exam Triage Vital Signs ED Triage Vitals  Enc Vitals Group     BP 08/07/20 1339 Marland Kitchen)  107/52     Pulse Rate 08/07/20 1339 80     Resp 08/07/20 1339 18     Temp 08/07/20 1339 99.3 F (37.4 C)     Temp src --      SpO2 08/07/20 1339 98 %     Weight --      Height --      Head Circumference --      Peak Flow --      Pain Score 08/07/20 1340 8     Pain Loc --      Pain Edu? --      Excl. in [place]? --    No data found.  Updated Vital Signs BP (!) 107/52   Pulse 80   Temp 99.3 F (37.4 C)   Resp 18   SpO2 98%   Visual Acuity Right Eye Distance:   Left Eye Distance:   Bilateral Distance:    Right Eye Near:   Left Eye Near:    Bilateral Near:     Physical Exam Vitals and nursing note reviewed.  Constitutional:       General: She is awake. She is not in acute distress.    Appearance: She is well-groomed and underweight.     Comments: She is sitting in the exam chair in no acute distress.   HENT:     Head: Normocephalic and atraumatic.     Right Ear: Hearing normal.     Left Ear: Hearing normal.  Eyes:     Extraocular Movements: Extraocular movements intact.     Conjunctiva/sclera: Conjunctivae normal.  Cardiovascular:     Rate and Rhythm: Normal rate and regular rhythm.     Heart sounds: Normal heart sounds. No murmur heard. Pulmonary:     Effort: Pulmonary effort is normal. No respiratory distress.     Breath sounds: Normal breath sounds and air entry. No decreased air movement. No decreased breath sounds, wheezing, rhonchi or rales.  Abdominal:     General: Abdomen is flat. There is no abdominal bruit.     Palpations: Abdomen is soft.     Tenderness: There is abdominal tenderness in the suprapubic area. There is right CVA tenderness and left CVA tenderness.    Skin:    General: Skin is warm and dry.     Capillary Refill: Capillary refill takes less than 2 seconds.     Findings: No rash.  Neurological:     General: No focal deficit present.     Mental Status: She is alert and oriented to person, place, and time.  Psychiatric:        Mood and Affect: Mood normal.        Behavior: Behavior normal. Behavior is cooperative.        Thought Content: Thought content normal.        Judgment: Judgment normal.     UC Treatments / Results  Labs (all labs ordered are listed, but only abnormal results are displayed) Labs Reviewed  POCT URINALYSIS DIPSTICK, ED / UC - Abnormal; Notable for the following components:      Result Value   Urobilinogen, UA 2.0 (*)    Nitrite POSITIVE (*)    Leukocytes,Ua TRACE (*)    All other components within normal limits    EKG   Radiology No results found.  Procedures Procedures (including critical care time)  Medications Ordered in UC Medications -  No data to display  Initial Impression / Assessment and Plan / UC Course  I have reviewed the triage vital signs and the nursing notes.  Pertinent labs & imaging results that were available during my care of the patient were reviewed by me and considered in my medical decision making (see chart for details).     Reviewed urinalysis results with patient- positive nitrites and trace WBC's. No distinct blood seen today in urine sample. Patient had difficulty obtaining specimen and getting a clean catch so no urine culture performed due to probable skin contamination. History, clinical presentation and UA consistent with possible UTI. Patient has reaction/allergy to many medications but has taken Cipro in the past with good success. Reviewed black box warning with patient. Will trial Cipro 519m twice a day for 7 days. Continue to push fluids/water and may continue AZO as needed for discomfort. If symptoms are not improving with 48 hours, return for recheck and repeat UA/urine culture. Otherwise, follow-up with her PCP as directed.  Final Clinical Impressions(s) / UC Diagnoses   Final diagnoses:  Dysuria  Gross hematuria  Urinary frequency  Urinary urgency     Discharge Instructions      Recommend start Cipro 5024mtwice a day as directed. Continue to push fluids and may continue AZO as needed for comfort. If symptoms are not improving within 2 days, return for recheck. Otherwise, follow-up with your PCP as directed.      ED Prescriptions     Medication Sig Dispense Auth. Provider   ciprofloxacin (CIPRO) 500 MG tablet Take 1 tablet (500 mg total) by mouth every 12 (twelve) hours for 7 days. 14 tablet Jiayi Lengacher, AnNicholes StairsNP      PDMP not reviewed this encounter.   AmKaty ApoNP 08/08/20 [phone]

## 2020-08-07 NOTE — ED Triage Notes (Signed)
Pt reports hematuria ,dysuria ,incontence,

## 2020-08-07 NOTE — Discharge Instructions (Addendum)
Recommend start Cipro 500mg  twice a day as directed. Continue to push fluids and may continue AZO as needed for comfort. If symptoms are not improving within 2 days, return for recheck. Otherwise, follow-up with your PCP as directed.

## 2020-08-08 DIAGNOSIS — Z20822 Contact with and (suspected) exposure to covid-19: Secondary | ICD-10-CM | POA: Diagnosis not present

## 2020-08-09 ENCOUNTER — Ambulatory Visit: Payer: Medicare Other | Admitting: Endocrinology

## 2020-08-14 DIAGNOSIS — Z20822 Contact with and (suspected) exposure to covid-19: Secondary | ICD-10-CM | POA: Diagnosis not present

## 2020-08-15 ENCOUNTER — Encounter: Payer: Self-pay | Admitting: Internal Medicine

## 2020-08-15 ENCOUNTER — Other Ambulatory Visit: Payer: Self-pay

## 2020-08-15 ENCOUNTER — Ambulatory Visit (AMBULATORY_SURGERY_CENTER): Payer: Medicare Other | Admitting: Internal Medicine

## 2020-08-15 VITALS — BP 129/72 | HR 77 | Temp 97.1°F | Resp 13 | Ht 65.0 in | Wt 106.0 lb

## 2020-08-15 DIAGNOSIS — F0390 Unspecified dementia without behavioral disturbance: Secondary | ICD-10-CM | POA: Diagnosis not present

## 2020-08-15 DIAGNOSIS — Z86010 Personal history of colonic polyps: Secondary | ICD-10-CM

## 2020-08-15 DIAGNOSIS — M797 Fibromyalgia: Secondary | ICD-10-CM | POA: Diagnosis not present

## 2020-08-15 DIAGNOSIS — E119 Type 2 diabetes mellitus without complications: Secondary | ICD-10-CM | POA: Diagnosis not present

## 2020-08-15 MED ORDER — SODIUM CHLORIDE 0.9 % IV SOLN: 500.0000 mL | Freq: Once | INTRAVENOUS | Status: DC

## 2020-08-15 NOTE — Progress Notes (Signed)
Report to PACU, RN, vss, BBS= Clear.  

## 2020-08-15 NOTE — Patient Instructions (Addendum)
No polyps!  Very tricky to do colonoscopy today but we did complete it.  Please schedule a follow-up appointment to see me - we will try to set one up for you today if not possible please call   I appreciate the opportunity to care for you. Gatha Mayer, MD, [facility]  Resume previous diet and medications.  YOU HAD AN ENDOSCOPIC PROCEDURE TODAY AT [facility]:   Refer to the procedure report that was given to you for any specific questions about what was found during the examination.  If the procedure report does not answer your questions, please call your gastroenterologist to clarify.  If you requested that your care partner not be given the details of your procedure findings, then the procedure report has been included in a sealed envelope for you to review at your convenience later.  YOU SHOULD EXPECT: Some feelings of bloating in the abdomen. Passage of more gas than usual.  Walking can help get rid of the air that was put into your GI tract during the procedure and reduce the bloating. If you had a lower endoscopy (such as a colonoscopy or flexible sigmoidoscopy) you may notice spotting of blood in your stool or on the toilet paper. If you underwent a bowel prep for your procedure, you may not have a normal bowel movement for a few days.  Please Note:  You might notice some irritation and congestion in your nose or some drainage.  This is from the oxygen used during your procedure.  There is no need for concern and it should clear up in a day or so.  SYMPTOMS TO REPORT IMMEDIATELY:  Following lower endoscopy (colonoscopy or flexible sigmoidoscopy):  Excessive amounts of blood in the stool  Significant tenderness or worsening of abdominal pains  Swelling of the abdomen that is new, acute  Fever of 100F or higher   For urgent or emergent issues, a gastroenterologist can be reached at any hour by calling [phone]. Do not use MyChart messaging for urgent concerns.     DIET:  We do recommend a small meal at first, but then you may proceed to your regular diet.  Drink plenty of fluids but you should avoid alcoholic beverages for 24 hours.  ACTIVITY:  You should plan to take it easy for the rest of today and you should NOT DRIVE or use heavy machinery until tomorrow (because of the sedation medicines used during the test).    FOLLOW UP: Our staff will call the number listed on your records 48-72 hours following your procedure to check on you and address any questions or concerns that you may have regarding the information given to you following your procedure. If we do not reach you, we will leave a message.  We will attempt to reach you two times.  During this call, we will ask if you have developed any symptoms of COVID 19. If you develop any symptoms (ie: fever, flu-like symptoms, shortness of breath, cough etc.) before then, please call [phone].  If you test positive for Covid 19 in the 2 weeks post procedure, please call and report this information to [place].    If any biopsies were taken you will be contacted by phone or by letter within the next 1-3 weeks.  Please call us at [phone] if you have not heard about the biopsies in 3 weeks.    SIGNATURES/CONFIDENTIALITY: You and/or your care partner have signed paperwork which will be entered into your  electronic medical record.  These signatures attest to the fact that that the information above on your After Visit Summary has been reviewed and is understood.  Full responsibility of the confidentiality of this discharge information lies with you and/or your care-partner.

## 2020-08-15 NOTE — Progress Notes (Signed)
Medical history reviewed with no changes noted. VS assessed by C.W 

## 2020-08-15 NOTE — Op Note (Addendum)
[facility] Patient Name: Kayla [name] Procedure Date: 08/15/2020 9:25 AM MRN: [id number] Endoscopist: Gatha Mayer , MD Age: 75 Referring MD:  Date of Birth: 1945/08/18 Gender: Female Account #: [id number] Procedure:                Colonoscopy Indications:              Surveillance: Personal history of adenomatous                            polyps on last colonoscopy 5 years ago Medicines:                Propofol per Anesthesia, Monitored Anesthesia Care Procedure:                Pre-Anesthesia Assessment:                           - Prior to the procedure, a History and Physical                            was performed, and patient medications and                            allergies were reviewed. The patient's tolerance of                            previous anesthesia was also reviewed. The risks                            and benefits of the procedure and the sedation                            options and risks were discussed with the patient.                            All questions were answered, and informed consent                            was obtained. Prior Anticoagulants: The patient has                            taken no previous anticoagulant or antiplatelet                            agents. ASA Grade Assessment: III - A patient with                            severe systemic disease. After reviewing the risks                            and benefits, the patient was deemed in                            satisfactory condition to undergo the procedure.  After obtaining informed consent, the colonoscope                            was passed under direct vision. Throughout the                            procedure, the patient's blood pressure, pulse, and                            oxygen saturations were monitored continuously. The                            Olympus PCF-[id number] (#[id number]) Colonoscope was                             introduced through the anus and advanced to the the                            cecum, identified by appendiceal orifice and                            ileocecal valve. The colonoscopy was performed with                            difficulty due to a redundant colon and significant                            looping. Successful completion of the procedure was                            aided by straightening and shortening the scope to                            obtain bowel loop reduction and applying abdominal                            pressure. The patient tolerated the procedure well.                            The quality of the bowel preparation was adequate.                            The ileocecal valve, appendiceal orifice, and                            rectum were photographed. Scope In: 9:47:01 AM Scope Out: 11:11:30 AM Scope Withdrawal Time: 0 hours 26 minutes 53 seconds  Total Procedure Duration: 1 hour 24 minutes 29 seconds  Findings:                 The digital rectal exam findings include decreased                            sphincter tone.  The sigmoid colon was grossly redundant.                           The colon (entire examined portion) revealed                            grossly excessive looping.                           The exam was otherwise without abnormality on                            direct and retroflexion views. Complications:            No immediate complications. Estimated Blood Loss:     Estimated blood loss: none. Impression:               - Decreased sphincter tone found on digital rectal                            exam.                           - Redundant colon.                           - There was significant looping of the colon.                           - The examination was otherwise normal on direct                            and retroflexion views. Scope was withdrawn and                            lens  cleared to get better visualization (scope                            reinserted to cecum)                           - No specimens collected. Recommendation:           - Patient has a contact number available for                            emergencies. The signs and symptoms of potential                            delayed complications were discussed with the                            patient. Return to normal activities tomorrow.                            Written discharge instructions were provided to the  patient.                           - Resume previous diet.                           - Continue present medications.                           - Schedule fgollow-up visit with me re: dysphagia                           no routine repeat colonoscopy                           if does require one in future would use abdominal                            binder and adult colonoscope Gatha Mayer, MD 08/15/2020 11:18:42 AM This report has been signed electronically.

## 2020-08-17 ENCOUNTER — Telehealth: Payer: Self-pay | Admitting: *Deleted

## 2020-08-17 ENCOUNTER — Telehealth: Payer: Self-pay

## 2020-08-17 DIAGNOSIS — E43 Unspecified severe protein-calorie malnutrition: Secondary | ICD-10-CM | POA: Diagnosis not present

## 2020-08-17 DIAGNOSIS — G609 Hereditary and idiopathic neuropathy, unspecified: Secondary | ICD-10-CM | POA: Diagnosis not present

## 2020-08-17 DIAGNOSIS — K22719 Barrett's esophagus with dysplasia, unspecified: Secondary | ICD-10-CM | POA: Diagnosis not present

## 2020-08-17 DIAGNOSIS — R32 Unspecified urinary incontinence: Secondary | ICD-10-CM | POA: Diagnosis not present

## 2020-08-17 DIAGNOSIS — K279 Peptic ulcer, site unspecified, unspecified as acute or chronic, without hemorrhage or perforation: Secondary | ICD-10-CM | POA: Diagnosis not present

## 2020-08-17 DIAGNOSIS — N390 Urinary tract infection, site not specified: Secondary | ICD-10-CM | POA: Diagnosis not present

## 2020-08-17 DIAGNOSIS — M47816 Spondylosis without myelopathy or radiculopathy, lumbar region: Secondary | ICD-10-CM | POA: Diagnosis not present

## 2020-08-17 DIAGNOSIS — K581 Irritable bowel syndrome with constipation: Secondary | ICD-10-CM | POA: Diagnosis not present

## 2020-08-17 DIAGNOSIS — G43001 Migraine without aura, not intractable, with status migrainosus: Secondary | ICD-10-CM | POA: Diagnosis not present

## 2020-08-17 DIAGNOSIS — E1121 Type 2 diabetes mellitus with diabetic nephropathy: Secondary | ICD-10-CM | POA: Diagnosis not present

## 2020-08-17 DIAGNOSIS — R2243 Localized swelling, mass and lump, lower limb, bilateral: Secondary | ICD-10-CM | POA: Diagnosis not present

## 2020-08-17 DIAGNOSIS — E11649 Type 2 diabetes mellitus with hypoglycemia without coma: Secondary | ICD-10-CM | POA: Diagnosis not present

## 2020-08-17 NOTE — Telephone Encounter (Signed)
Left message on f/u call 

## 2020-08-17 NOTE — Telephone Encounter (Signed)
Left message on answering machine. 

## 2020-08-28 DIAGNOSIS — E161 Other hypoglycemia: Secondary | ICD-10-CM | POA: Diagnosis not present

## 2020-08-28 DIAGNOSIS — R739 Hyperglycemia, unspecified: Secondary | ICD-10-CM | POA: Diagnosis not present

## 2020-08-30 ENCOUNTER — Telehealth: Payer: Self-pay | Admitting: Rheumatology

## 2020-08-30 NOTE — Telephone Encounter (Signed)
Opened in error

## 2020-08-31 DIAGNOSIS — N3941 Urge incontinence: Secondary | ICD-10-CM | POA: Diagnosis not present

## 2020-08-31 DIAGNOSIS — G894 Chronic pain syndrome: Secondary | ICD-10-CM | POA: Diagnosis not present

## 2020-09-05 DIAGNOSIS — E161 Other hypoglycemia: Secondary | ICD-10-CM | POA: Diagnosis not present

## 2020-09-05 DIAGNOSIS — Z681 Body mass index (BMI) 19 or less, adult: Secondary | ICD-10-CM | POA: Diagnosis not present

## 2020-09-05 DIAGNOSIS — Z713 Dietary counseling and surveillance: Secondary | ICD-10-CM | POA: Diagnosis not present
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
## 2020-09-19 ENCOUNTER — Ambulatory Visit (INDEPENDENT_AMBULATORY_CARE_PROVIDER_SITE_OTHER): Payer: Medicare Other | Admitting: Neurology

## 2020-09-19 ENCOUNTER — Encounter: Payer: Self-pay | Admitting: Neurology

## 2020-09-19 ENCOUNTER — Other Ambulatory Visit: Payer: Self-pay

## 2020-09-19 VITALS — BP 118/71 | HR 65 | Resp 18 | Ht 65.0 in | Wt 107.0 lb

## 2020-09-19 DIAGNOSIS — G43009 Migraine without aura, not intractable, without status migrainosus: Secondary | ICD-10-CM | POA: Diagnosis not present

## 2020-09-19 DIAGNOSIS — G894 Chronic pain syndrome: Secondary | ICD-10-CM

## 2020-09-19 DIAGNOSIS — G6289 Other specified polyneuropathies: Secondary | ICD-10-CM

## 2020-09-19 MED ORDER — PREGABALIN 50 MG PO CAPS: ORAL_CAPSULE | ORAL | 1 refills | Status: DC

## 2020-09-19 NOTE — Patient Instructions (Signed)
Increase Lyrica to 50mg  in morning and 100mg  at night Continue duloxetine, Emgality, sumatriptan

## 2020-09-26 DIAGNOSIS — E161 Other hypoglycemia: Secondary | ICD-10-CM | POA: Diagnosis not present

## 2020-09-27 ENCOUNTER — Telehealth: Payer: Self-pay | Admitting: Neurology

## 2020-09-27 ENCOUNTER — Encounter: Payer: Self-pay | Admitting: Psychology

## 2020-09-27 DIAGNOSIS — Z20822 Contact with and (suspected) exposure to covid-19: Secondary | ICD-10-CM | POA: Diagnosis not present

## 2020-09-27 DIAGNOSIS — R413 Other amnesia: Secondary | ICD-10-CM

## 2020-09-27 NOTE — Telephone Encounter (Signed)
Per DR.Jaffe, Recommend neuropsychological evaluation.   Tried calling pt,No answer.  Front desk when you get a chance can you call the pt. Again to schedule the Neuropsychological evaluation

## 2020-09-27 NOTE — Telephone Encounter (Signed)
Caller stated she has ben experiencing terrible memory issues over the last year or so and she was advised to talk to [facility] about it.she loses track of time, and cant even remember her daughter living with her.

## 2020-09-27 NOTE — Telephone Encounter (Signed)
Pt states she had mentioned this to her New Endocrinologist at [facility], that she losses time, and can not remember her daughter staying in her home over the last year. She can not remember conversation she had or what she read. She was advised to speak to [facility].    Please advise Last MRI Brain 2019.

## 2020-09-28 DIAGNOSIS — N3941 Urge incontinence: Secondary | ICD-10-CM | POA: Diagnosis not present

## 2020-10-05 ENCOUNTER — Telehealth: Payer: Self-pay | Admitting: Neurology
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
## 2020-10-05 NOTE — Telephone Encounter (Signed)
Per Dr.Jaffe, She needs to follow up with her PCP.  Pt advised.

## 2020-10-10 ENCOUNTER — Other Ambulatory Visit: Payer: Self-pay | Admitting: Family Medicine

## 2020-10-10 ENCOUNTER — Ambulatory Visit
Admission: RE | Admit: 2020-10-10 | Discharge: 2020-10-10 | Disposition: A | Discharge status: Home / Self Care | Payer: Medicare Other | Source: Ambulatory Visit | Attending: Family Medicine | Admitting: Family Medicine

## 2020-10-10 DIAGNOSIS — E1142 Type 2 diabetes mellitus with diabetic polyneuropathy: Secondary | ICD-10-CM | POA: Diagnosis not present

## 2020-10-10 DIAGNOSIS — G729 Myopathy, unspecified: Secondary | ICD-10-CM

## 2020-10-10 DIAGNOSIS — E1121 Type 2 diabetes mellitus with diabetic nephropathy: Secondary | ICD-10-CM | POA: Diagnosis not present

## 2020-10-10 DIAGNOSIS — M47812 Spondylosis without myelopathy or radiculopathy, cervical region: Secondary | ICD-10-CM | POA: Diagnosis not present

## 2020-10-10 DIAGNOSIS — E11649 Type 2 diabetes mellitus with hypoglycemia without coma: Secondary | ICD-10-CM | POA: Diagnosis not present

## 2020-10-10 DIAGNOSIS — K5900 Constipation, unspecified: Secondary | ICD-10-CM | POA: Diagnosis not present

## 2020-10-10 DIAGNOSIS — M25511 Pain in right shoulder: Secondary | ICD-10-CM

## 2020-10-10 DIAGNOSIS — K279 Peptic ulcer, site unspecified, unspecified as acute or chronic, without hemorrhage or perforation: Secondary | ICD-10-CM | POA: Diagnosis not present

## 2020-10-10 DIAGNOSIS — K588 Other irritable bowel syndrome: Secondary | ICD-10-CM | POA: Diagnosis not present

## 2020-10-10 DIAGNOSIS — R222 Localized swelling, mass and lump, trunk: Secondary | ICD-10-CM | POA: Diagnosis not present

## 2020-10-10 DIAGNOSIS — R32 Unspecified urinary incontinence: Secondary | ICD-10-CM | POA: Diagnosis not present

## 2020-10-10 DIAGNOSIS — G609 Hereditary and idiopathic neuropathy, unspecified: Secondary | ICD-10-CM | POA: Diagnosis not present

## 2020-10-10 DIAGNOSIS — M542 Cervicalgia: Secondary | ICD-10-CM

## 2020-10-10 DIAGNOSIS — E162 Hypoglycemia, unspecified: Secondary | ICD-10-CM | POA: Diagnosis not present

## 2020-10-10 DIAGNOSIS — M47816 Spondylosis without myelopathy or radiculopathy, lumbar region: Secondary | ICD-10-CM | POA: Diagnosis not present

## 2020-10-10 DIAGNOSIS — E672 Megavitamin-B6 syndrome: Secondary | ICD-10-CM | POA: Diagnosis not present

## 2020-10-10 DIAGNOSIS — G43001 Migraine without aura, not intractable, with status migrainosus: Secondary | ICD-10-CM | POA: Diagnosis not present

## 2020-10-10 DIAGNOSIS — E43 Unspecified severe protein-calorie malnutrition: Secondary | ICD-10-CM | POA: Diagnosis not present

## 2020-10-10 DIAGNOSIS — R221 Localized swelling, mass and lump, neck: Secondary | ICD-10-CM | POA: Diagnosis not present

## 2020-10-10 DIAGNOSIS — M130 Polyarthritis, unspecified: Secondary | ICD-10-CM | POA: Diagnosis not present

## 2020-10-10 DIAGNOSIS — S060X9A Concussion with loss of consciousness of unspecified duration, initial encounter: Secondary | ICD-10-CM | POA: Diagnosis not present

## 2020-10-10 DIAGNOSIS — K22719 Barrett's esophagus with dysplasia, unspecified: Secondary | ICD-10-CM | POA: Diagnosis not present

## 2020-10-10 DIAGNOSIS — I69811 Memory deficit following other cerebrovascular disease: Secondary | ICD-10-CM | POA: Diagnosis not present

## 2020-10-10 DIAGNOSIS — E559 Vitamin D deficiency, unspecified: Secondary | ICD-10-CM | POA: Diagnosis not present

## 2020-10-20 ENCOUNTER — Other Ambulatory Visit: Payer: Self-pay | Admitting: Family Medicine

## 2020-10-20 DIAGNOSIS — Z1231 Encounter for screening mammogram for malignant neoplasm of breast: Secondary | ICD-10-CM

## 2020-10-24 DIAGNOSIS — N3941 Urge incontinence: Secondary | ICD-10-CM | POA: Diagnosis not present

## 2020-11-01 ENCOUNTER — Other Ambulatory Visit: Payer: Self-pay | Admitting: Family Medicine

## 2020-11-01 DIAGNOSIS — M542 Cervicalgia: Secondary | ICD-10-CM

## 2020-11-01 DIAGNOSIS — R519 Headache, unspecified: Secondary | ICD-10-CM

## 2020-11-07 ENCOUNTER — Telehealth: Payer: Self-pay | Admitting: Pharmacist

## 2020-11-07 DIAGNOSIS — M810 Age-related osteoporosis without current pathological fracture: Secondary | ICD-10-CM

## 2020-11-07 DIAGNOSIS — Z79899 Other long term (current) drug therapy: Secondary | ICD-10-CM

## 2020-11-07 NOTE — Telephone Encounter (Signed)
Patient due for Prolia on or after 11/22/20. She has appt on 12/04/20. Per Hazel Sams, PA-C, okay to get Prolia labs completed at that visit and if wnl, will place Prolia orders thereafter  Standing orders placed for CBC and CMP  Knox Saliva, PharmD, MPH, BCPS Clinical Pharmacist (Rheumatology and Pulmonology)

## 2020-11-15 ENCOUNTER — Other Ambulatory Visit (HOSPITAL_COMMUNITY): Payer: Self-pay | Admitting: Family Medicine

## 2020-11-15 DIAGNOSIS — R519 Headache, unspecified: Secondary | ICD-10-CM

## 2020-11-15 DIAGNOSIS — M810 Age-related osteoporosis without current pathological fracture: Secondary | ICD-10-CM | POA: Diagnosis not present

## 2020-11-15 DIAGNOSIS — R32 Unspecified urinary incontinence: Secondary | ICD-10-CM | POA: Diagnosis not present

## 2020-11-15 DIAGNOSIS — S199XXA Unspecified injury of neck, initial encounter: Secondary | ICD-10-CM

## 2020-11-15 DIAGNOSIS — Z791 Long term (current) use of non-steroidal anti-inflammatories (NSAID): Secondary | ICD-10-CM | POA: Diagnosis not present

## 2020-11-15 DIAGNOSIS — G609 Hereditary and idiopathic neuropathy, unspecified: Secondary | ICD-10-CM | POA: Diagnosis not present

## 2020-11-15 DIAGNOSIS — M542 Cervicalgia: Secondary | ICD-10-CM

## 2020-11-15 DIAGNOSIS — M4622 Osteomyelitis of vertebra, cervical region: Secondary | ICD-10-CM

## 2020-11-15 DIAGNOSIS — Z79891 Long term (current) use of opiate analgesic: Secondary | ICD-10-CM | POA: Diagnosis not present

## 2020-11-15 DIAGNOSIS — E43 Unspecified severe protein-calorie malnutrition: Secondary | ICD-10-CM | POA: Diagnosis not present

## 2020-11-15 DIAGNOSIS — R601 Generalized edema: Secondary | ICD-10-CM | POA: Diagnosis not present

## 2020-11-15 DIAGNOSIS — M130 Polyarthritis, unspecified: Secondary | ICD-10-CM | POA: Diagnosis not present

## 2020-11-15 DIAGNOSIS — G603 Idiopathic progressive neuropathy: Secondary | ICD-10-CM | POA: Diagnosis not present

## 2020-11-15 DIAGNOSIS — E559 Vitamin D deficiency, unspecified: Secondary | ICD-10-CM | POA: Diagnosis not present

## 2020-11-15 DIAGNOSIS — E672 Megavitamin-B6 syndrome: Secondary | ICD-10-CM | POA: Diagnosis not present

## 2020-11-16 ENCOUNTER — Other Ambulatory Visit: Payer: Medicare Other

## 2020-11-17 ENCOUNTER — Other Ambulatory Visit (HOSPITAL_COMMUNITY): Payer: Self-pay | Admitting: Family Medicine

## 2020-11-17 DIAGNOSIS — M542 Cervicalgia: Secondary | ICD-10-CM

## 2020-11-17 DIAGNOSIS — R519 Headache, unspecified: Secondary | ICD-10-CM

## 2020-11-20 DIAGNOSIS — Q438 Other specified congenital malformations of intestine: Secondary | ICD-10-CM | POA: Diagnosis not present

## 2020-11-20 DIAGNOSIS — E1121 Type 2 diabetes mellitus with diabetic nephropathy: Secondary | ICD-10-CM | POA: Diagnosis not present

## 2020-11-20 DIAGNOSIS — M542 Cervicalgia: Secondary | ICD-10-CM | POA: Diagnosis not present

## 2020-11-20 DIAGNOSIS — E11649 Type 2 diabetes mellitus with hypoglycemia without coma: Secondary | ICD-10-CM | POA: Diagnosis not present

## 2020-11-20 DIAGNOSIS — G609 Hereditary and idiopathic neuropathy, unspecified: Secondary | ICD-10-CM | POA: Diagnosis not present

## 2020-11-20 DIAGNOSIS — M130 Polyarthritis, unspecified: Secondary | ICD-10-CM | POA: Diagnosis not present

## 2020-11-20 DIAGNOSIS — E1142 Type 2 diabetes mellitus with diabetic polyneuropathy: Secondary | ICD-10-CM | POA: Diagnosis not present

## 2020-11-20 DIAGNOSIS — R110 Nausea: Secondary | ICD-10-CM | POA: Diagnosis not present

## 2020-11-20 DIAGNOSIS — E43 Unspecified severe protein-calorie malnutrition: Secondary | ICD-10-CM | POA: Diagnosis not present

## 2020-11-20 DIAGNOSIS — K279 Peptic ulcer, site unspecified, unspecified as acute or chronic, without hemorrhage or perforation: Secondary | ICD-10-CM | POA: Diagnosis not present

## 2020-11-20 DIAGNOSIS — G43001 Migraine without aura, not intractable, with status migrainosus: Secondary | ICD-10-CM | POA: Diagnosis not present

## 2020-11-20 DIAGNOSIS — R32 Unspecified urinary incontinence: Secondary | ICD-10-CM | POA: Diagnosis not present

## 2020-11-20 DIAGNOSIS — K5900 Constipation, unspecified: Secondary | ICD-10-CM | POA: Diagnosis not present

## 2020-11-20 DIAGNOSIS — M47816 Spondylosis without myelopathy or radiculopathy, lumbar region: Secondary | ICD-10-CM | POA: Diagnosis not present

## 2020-11-20 DIAGNOSIS — F32A Depression, unspecified: Secondary | ICD-10-CM | POA: Diagnosis not present
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
## 2020-11-23 ENCOUNTER — Other Ambulatory Visit: Payer: Self-pay

## 2020-11-23 ENCOUNTER — Ambulatory Visit (HOSPITAL_COMMUNITY)
Admission: RE | Admit: 2020-11-23 | Discharge: 2020-11-23 | Disposition: A | Discharge status: Home / Self Care | Payer: Medicare Other | Source: Ambulatory Visit | Attending: Family Medicine | Admitting: Family Medicine

## 2020-11-23 ENCOUNTER — Ambulatory Visit
Admission: RE | Admit: 2020-11-23 | Discharge: 2020-11-23 | Disposition: A | Discharge status: Home / Self Care | Payer: Medicare Other | Source: Ambulatory Visit | Attending: Family Medicine | Admitting: Family Medicine

## 2020-11-23 DIAGNOSIS — G319 Degenerative disease of nervous system, unspecified: Secondary | ICD-10-CM | POA: Diagnosis not present

## 2020-11-23 DIAGNOSIS — R410 Disorientation, unspecified: Secondary | ICD-10-CM | POA: Diagnosis not present

## 2020-11-23 DIAGNOSIS — Z1231 Encounter for screening mammogram for malignant neoplasm of breast: Secondary | ICD-10-CM | POA: Diagnosis not present

## 2020-11-23 DIAGNOSIS — M542 Cervicalgia: Secondary | ICD-10-CM | POA: Diagnosis not present

## 2020-11-23 DIAGNOSIS — R519 Headache, unspecified: Secondary | ICD-10-CM | POA: Insufficient documentation

## 2020-11-23 MED ORDER — GADOBUTROL 1 MMOL/ML IV SOLN
5.0000 mL | Freq: Once | INTRAVENOUS | Status: AC | PRN
  Administered 2020-11-23: 5 mL via INTRAVENOUS

## 2020-12-04 ENCOUNTER — Other Ambulatory Visit: Payer: Self-pay

## 2020-12-04 ENCOUNTER — Encounter: Payer: Self-pay | Admitting: Physician Assistant

## 2020-12-04 ENCOUNTER — Ambulatory Visit (INDEPENDENT_AMBULATORY_CARE_PROVIDER_SITE_OTHER): Payer: Medicare Other | Admitting: Physician Assistant

## 2020-12-04 VITALS — BP 102/61 | HR 93 | Resp 15 | Ht 65.0 in | Wt 104.0 lb

## 2020-12-04 DIAGNOSIS — E162 Hypoglycemia, unspecified: Secondary | ICD-10-CM | POA: Diagnosis not present

## 2020-12-04 DIAGNOSIS — E559 Vitamin D deficiency, unspecified: Secondary | ICD-10-CM

## 2020-12-04 DIAGNOSIS — N3010 Interstitial cystitis (chronic) without hematuria: Secondary | ICD-10-CM

## 2020-12-04 DIAGNOSIS — R5383 Other fatigue: Secondary | ICD-10-CM | POA: Diagnosis not present

## 2020-12-04 DIAGNOSIS — M810 Age-related osteoporosis without current pathological fracture: Secondary | ICD-10-CM

## 2020-12-04 DIAGNOSIS — R252 Cramp and spasm: Secondary | ICD-10-CM | POA: Diagnosis not present

## 2020-12-04 DIAGNOSIS — Z8719 Personal history of other diseases of the digestive system: Secondary | ICD-10-CM

## 2020-12-04 DIAGNOSIS — G894 Chronic pain syndrome: Secondary | ICD-10-CM

## 2020-12-04 DIAGNOSIS — Z79899 Other long term (current) drug therapy: Secondary | ICD-10-CM | POA: Diagnosis not present

## 2020-12-04 DIAGNOSIS — F32A Depression, unspecified: Secondary | ICD-10-CM

## 2020-12-04 DIAGNOSIS — M797 Fibromyalgia: Secondary | ICD-10-CM | POA: Diagnosis not present

## 2020-12-04 DIAGNOSIS — M5030 Other cervical disc degeneration, unspecified cervical region: Secondary | ICD-10-CM | POA: Diagnosis not present

## 2020-12-04 DIAGNOSIS — F419 Anxiety disorder, unspecified: Secondary | ICD-10-CM | POA: Diagnosis not present

## 2020-12-04 DIAGNOSIS — M792 Neuralgia and neuritis, unspecified: Secondary | ICD-10-CM

## 2020-12-04 DIAGNOSIS — M7062 Trochanteric bursitis, left hip: Secondary | ICD-10-CM

## 2020-12-04 DIAGNOSIS — Z5181 Encounter for therapeutic drug level monitoring: Secondary | ICD-10-CM

## 2020-12-04 DIAGNOSIS — M7061 Trochanteric bursitis, right hip: Secondary | ICD-10-CM

## 2020-12-04 DIAGNOSIS — M5136 Other intervertebral disc degeneration, lumbar region: Secondary | ICD-10-CM

## 2020-12-04 NOTE — Telephone Encounter (Signed)
Patient has Prolia labs drawn at [facility] today with Hazel Sams, PA-C. Will place order w [facility] once labs return and are wnl  Knox Saliva, PharmD, MPH, BCPS Clinical Pharmacist (Rheumatology and Pulmonology)

## 2020-12-04 NOTE — Patient Instructions (Addendum)
Magnesium malate 250 mg at bedtime for muscle cramps You can increase the dose to 500 mg as tolerated.  Potential side effects include drowsiness and diarrhea.     Hand Exercises Hand exercises can be helpful for almost anyone. These exercises can strengthen the hands, improve flexibility and movement, and increase blood flow to the hands. These results can make work and daily tasks easier. Hand exercises can be especially helpful for people who have joint pain from arthritis or have nerve damage from overuse (carpal tunnel syndrome). These exercises can also help people who have injured a hand. Exercises Most of these hand exercises are gentle stretching and motion exercises. It is usually safe to do them often throughout the day. Warming up your hands before exercise may help to reduce stiffness. You can do this with gentle massage or by placing your hands in warm water for 10-15 minutes. It is normal to feel some stretching, pulling, tightness, or mild discomfort as you begin new exercises. This will gradually improve. Stop an exercise right away if you feel sudden, severe pain or your pain gets worse. Ask your health care provider which exercises are best for you. Knuckle bend or "claw" fist  Stand or sit with your arm, hand, and all five fingers pointed straight up. Make sure to keep your wrist straight during the exercise. Gently bend your fingers down toward your palm until the tips of your fingers are touching the top of your palm. Keep your big knuckle straight and just bend the small knuckles in your fingers. Hold this position for __________ seconds. Straighten (extend) your fingers back to the starting position. Repeat this exercise 5-10 times with each hand. Full finger fist  Stand or sit with your arm, hand, and all five fingers pointed straight up. Make sure to keep your wrist straight during the exercise. Gently bend your fingers into your palm until the tips of your fingers  are touching the middle of your palm. Hold this position for __________ seconds. Extend your fingers back to the starting position, stretching every joint fully. Repeat this exercise 5-10 times with each hand. Straight fist Stand or sit with your arm, hand, and all five fingers pointed straight up. Make sure to keep your wrist straight during the exercise. Gently bend your fingers at the big knuckle, where your fingers meet your hand, and the middle knuckle. Keep the knuckle at the tips of your fingers straight and try to touch the bottom of your palm. Hold this position for __________ seconds. Extend your fingers back to the starting position, stretching every joint fully. Repeat this exercise 5-10 times with each hand. Tabletop  Stand or sit with your arm, hand, and all five fingers pointed straight up. Make sure to keep your wrist straight during the exercise. Gently bend your fingers at the big knuckle, where your fingers meet your hand, as far down as you can while keeping the small knuckles in your fingers straight. Think of forming a tabletop with your fingers. Hold this position for __________ seconds. Extend your fingers back to the starting position, stretching every joint fully. Repeat this exercise 5-10 times with each hand. Finger spread  Place your hand flat on a table with your palm facing down. Make sure your wrist stays straight as you do this exercise. Spread your fingers and thumb apart from each other as far as you can until you feel a gentle stretch. Hold this position for __________ seconds. Bring your fingers and thumb tight  together again. Hold this position for __________ seconds. Repeat this exercise 5-10 times with each hand. Making circles  Stand or sit with your arm, hand, and all five fingers pointed straight up. Make sure to keep your wrist straight during the exercise. Make a circle by touching the tip of your thumb to the tip of your index finger. Hold for  __________ seconds. Then open your hand wide. Repeat this motion with your thumb and each finger on your hand. Repeat this exercise 5-10 times with each hand. Thumb motion  Sit with your forearm resting on a table and your wrist straight. Your thumb should be facing up toward the ceiling. Keep your fingers relaxed as you move your thumb. Lift your thumb up as high as you can toward the ceiling. Hold for __________ seconds. Bend your thumb across your palm as far as you can, reaching the tip of your thumb for the small finger (pinkie) side of your palm. Hold for __________ seconds. Repeat this exercise 5-10 times with each hand. Grip strengthening  Hold a stress ball or other soft ball in the middle of your hand. Slowly increase the pressure, squeezing the ball as much as you can without causing pain. Think of bringing the tips of your fingers into the middle of your palm. All of your finger joints should bend when doing this exercise. Hold your squeeze for __________ seconds, then relax. Repeat this exercise 5-10 times with each hand. Contact a health care provider if: Your hand pain or discomfort gets much worse when you do an exercise. Your hand pain or discomfort does not improve within 2 hours after you exercise. If you have any of these problems, stop doing these exercises right away. Do not do them again unless your health care provider says that you can. Get help right away if: You develop sudden, severe hand pain or swelling. If this happens, stop doing these exercises right away. Do not do them again unless your health care provider says that you can. This information is not intended to replace advice given to you by your health care provider. Make sure you discuss any questions you have with your health care provider. Document Revised: 04/20/2020 Document Reviewed: 04/20/2020 Elsevier Patient Education  [place].   Hip Bursitis Rehab Ask your health care provider which  exercises are safe for you. Do exercises exactly as told by your health care provider and adjust them as directed. It is normal to feel mild stretching, pulling, tightness, or discomfort as you do these exercises. Stop right away if you feel sudden pain or your pain gets worse. Do not begin these exercises until told by your health care provider. Stretching exercise This exercise warms up your muscles and joints and improves the movement and flexibility of your hip. This exercise also helps to relieve pain and stiffness. Iliotibial band stretch An iliotibial band is a strong band of muscle tissue that runs from the outer side of your hip to the outer side of your thigh and knee. Lie on your side with your left / right leg in the top position. Bend your left / right knee and grab your ankle. Stretch out your bottom arm to help you balance. Slowly bring your knee back so your thigh is behind your body. Slowly lower your knee toward the floor until you feel a gentle stretch on the outside of your left / right thigh. If you do not feel a stretch and your knee will not fall  farther, place the heel of your other foot on top of your knee and pull your knee down toward the floor with your foot. Hold this position for __________ seconds. Slowly return to the starting position. Repeat __________ times. Complete this exercise __________ times a day. Strengthening exercises These exercises build strength and endurance in your hip and pelvis. Endurance is the ability to use your muscles for a long time, even after they get tired. Bridge This exercise strengthens the muscles that move your thigh backward (hip extensors). Lie on your back on a firm surface with your knees bent and your feet flat on the floor. Tighten your buttocks muscles and lift your buttocks off the floor until your trunk is level with your thighs. Do not arch your back. You should feel the muscles working in your buttocks and the back of  your thighs. If you do not feel these muscles, slide your feet 1-2 inches (2.5-5 cm) farther away from your buttocks. If this exercise is too easy, try doing it with your arms crossed over your chest. Hold this position for __________ seconds. Slowly lower your hips to the starting position. Let your muscles relax completely after each repetition. Repeat __________ times. Complete this exercise __________ times a day. Squats This exercise strengthens the muscles in front of your thigh and knee (quadriceps). Stand in front of a table, with your feet and knees pointing straight ahead. You may rest your hands on the table for balance but not for support. Slowly bend your knees and lower your hips like you are going to sit in a chair. Keep your weight over your heels, not over your toes. Keep your lower legs upright so they are parallel with the table legs. Do not let your hips go lower than your knees. Do not bend lower than told by your health care provider. If your hip pain increases, do not bend as low. Hold the squat position for __________ seconds. Slowly push with your legs to return to standing. Do not use your hands to pull yourself to standing. Repeat __________ times. Complete this exercise __________ times a day. Hip hike Stand sideways on a bottom step. Stand on your left / right leg with your other foot unsupported next to the step. You can hold on to the railing or wall for balance if needed. Keep your knees straight and your torso square. Then lift your left / right hip up toward the ceiling. Hold this position for __________ seconds. Slowly let your left / right hip lower toward the floor, past the starting position. Your foot should get closer to the floor. Do not lean or bend your knees. Repeat __________ times. Complete this exercise __________ times a day. Single leg stand Without shoes, stand near a railing or in a doorway. You may hold on to the railing or door frame as  needed for balance. Squeeze your left / right buttock muscles, then lift up your other foot. Do not let your left / right hip push out to the side. It is helpful to stand in front of a mirror for this exercise so you can watch your hip. Hold this position for __________ seconds. Repeat __________ times. Complete this exercise __________ times a day. This information is not intended to replace advice given to you by your health care provider. Make sure you discuss any questions you have with your health care provider. Document Revised: 04/27/2018 Document Reviewed: 04/27/2018 Elsevier Patient Education  [place].

## 2020-12-05 LAB — CBC WITH DIFFERENTIAL/PLATELET
Absolute Monocytes: 502 cells/uL (ref 200–950)
Basophils Absolute: 40 cells/uL (ref 0–200)
Basophils Relative: 0.6 %
Eosinophils Absolute: 416 cells/uL (ref 15–500)
Eosinophils Relative: 6.3 %
HCT: 35.2 % (ref 35.0–45.0)
Hemoglobin: 11 g/dL — ABNORMAL LOW (ref 11.7–15.5)
Lymphs Abs: 2006 cells/uL (ref 850–3900)
MCH: 27.2 pg (ref 27.0–33.0)
MCHC: 31.3 g/dL — ABNORMAL LOW (ref 32.0–36.0)
MCV: 86.9 fL (ref 80.0–100.0)
MPV: 10 fL (ref 7.5–12.5)
Monocytes Relative: 7.6 %
Neutro Abs: 3637 cells/uL (ref 1500–7800)
Neutrophils Relative %: 55.1 %
Platelets: 293 10*3/uL (ref 140–400)
RBC: 4.05 10*6/uL (ref 3.80–5.10)
RDW: 16 % — ABNORMAL HIGH (ref 11.0–15.0)
Total Lymphocyte: 30.4 %
WBC: 6.6 10*3/uL (ref 3.8–10.8)

## 2020-12-05 LAB — COMPLETE METABOLIC PANEL WITH GFR
AG Ratio: 1.8 (calc) (ref 1.0–2.5)
ALT: 18 U/L (ref 6–29)
AST: 24 U/L (ref 10–35)
Albumin: 3.5 g/dL — ABNORMAL LOW (ref 3.6–5.1)
Alkaline phosphatase (APISO): 59 U/L (ref 37–153)
BUN: 24 mg/dL (ref 7–25)
CO2: 30 mmol/L (ref 20–32)
Calcium: 8.7 mg/dL (ref 8.6–10.4)
Chloride: 108 mmol/L (ref 98–110)
Creat: 0.76 mg/dL (ref 0.60–1.00)
Globulin: 1.9 g/dL (calc) (ref 1.9–3.7)
Glucose, Bld: 87 mg/dL (ref 65–99)
Potassium: 5.6 mmol/L — ABNORMAL HIGH (ref 3.5–5.3)
Sodium: 143 mmol/L (ref 135–146)
Total Bilirubin: 0.3 mg/dL (ref 0.2–1.2)
Total Protein: 5.4 g/dL — ABNORMAL LOW (ref 6.1–8.1)
eGFR: 83 mL/min/{1.73_m2} (ref >=60)

## 2020-12-05 LAB — VITAMIN D 25 HYDROXY (VIT D DEFICIENCY, FRACTURES): Vit D, 25-Hydroxy: 89 ng/mL (ref 30–100)

## 2020-12-05 LAB — MAGNESIUM: Magnesium: 2.2 mg/dL (ref 1.5–2.5)

## 2020-12-05 NOTE — Progress Notes (Signed)
Magnesium is WNL.  Potassium is elevated-5.6.  Please clarify if she is taking a potassium supplement. Please forward results to PCP.     Total protein is low.  Hx of gastric bypass per patient.  Vitamin D is WNL.   Hemoglobin remains low but stable. Patient reports receiving iron infusions.

## 2020-12-06 ENCOUNTER — Other Ambulatory Visit: Payer: Self-pay | Admitting: Pharmacist

## 2020-12-06 DIAGNOSIS — M810 Age-related osteoporosis without current pathological fracture: Secondary | ICD-10-CM

## 2020-12-06 NOTE — Telephone Encounter (Signed)
Labs on 12/04/20 ok to proceed with Prolia injection. Order placed w [facility]. Patient provided with phone number to schedule appointment  Knox Saliva, PharmD, MPH, BCPS Clinical Pharmacist (Rheumatology and Pulmonology)

## 2020-12-06 NOTE — Progress Notes (Signed)
Next infusion not yet scheduled for Prolia SQ and due for updated orders. Diagnosis: age-related osteoporosis  Dose: 60mg  SQ every 6 months  Last Clinic Visit: 12/04/20 Next Clinic Visit: 03/08/21  Last infusion: 05/26/20  Labs: 12/04/20 - vitamin D, CBC and CMP ok to proceed with Prolia  Orders placed for Prolia SQ x 1 dose. No premeds required.  Called patient and provided with phone number for: [facility] [phone]) Lady [place]  Will follow-up to ensured scheduled and completed  Knox Saliva, PharmD, MPH, BCPS Clinical Pharmacist (Rheumatology and Pulmonology)

## 2020-12-13 ENCOUNTER — Encounter: Payer: Self-pay | Admitting: Pharmacist

## 2020-12-13 NOTE — Progress Notes (Signed)
MyChart message sent to patient to remind her about scheduling Prolia. Will continue to f/u  Knox Saliva, PharmD, MPH, BCPS Clinical Pharmacist (Rheumatology and Pulmonology)

## 2020-12-19 NOTE — Progress Notes (Signed)
Patient scheduled Prolia for 12/27/20 at [facility]. Will f/u to ensure completed  Knox Saliva, PharmD, MPH, BCPS Clinical Pharmacist (Rheumatology and Pulmonology)

## 2020-12-27 ENCOUNTER — Inpatient Hospital Stay (HOSPITAL_COMMUNITY): Admission: RE | Admit: 2020-12-27 | Payer: Medicare Other | Source: Ambulatory Visit

## 2021-01-24 DIAGNOSIS — K719 Toxic liver disease, unspecified: Secondary | ICD-10-CM | POA: Diagnosis not present

## 2021-01-24 DIAGNOSIS — E1165 Type 2 diabetes mellitus with hyperglycemia: Secondary | ICD-10-CM | POA: Diagnosis not present

## 2021-01-24 DIAGNOSIS — Z9181 History of falling: Secondary | ICD-10-CM | POA: Diagnosis not present

## 2021-01-24 DIAGNOSIS — H3530 Unspecified macular degeneration: Secondary | ICD-10-CM | POA: Diagnosis not present

## 2021-01-24 DIAGNOSIS — G629 Polyneuropathy, unspecified: Secondary | ICD-10-CM | POA: Diagnosis not present

## 2021-01-24 DIAGNOSIS — G4700 Insomnia, unspecified: Secondary | ICD-10-CM | POA: Diagnosis not present

## 2021-01-24 DIAGNOSIS — Z9884 Bariatric surgery status: Secondary | ICD-10-CM | POA: Diagnosis not present

## 2021-01-24 DIAGNOSIS — M1990 Unspecified osteoarthritis, unspecified site: Secondary | ICD-10-CM | POA: Diagnosis not present

## 2021-01-24 DIAGNOSIS — M810 Age-related osteoporosis without current pathological fracture: Secondary | ICD-10-CM | POA: Diagnosis not present

## 2021-01-24 DIAGNOSIS — R2681 Unsteadiness on feet: Secondary | ICD-10-CM | POA: Diagnosis not present

## 2021-01-24 DIAGNOSIS — M797 Fibromyalgia: Secondary | ICD-10-CM | POA: Diagnosis not present

## 2021-01-24 DIAGNOSIS — K2270 Barrett's esophagus without dysplasia: Secondary | ICD-10-CM | POA: Diagnosis not present

## 2021-01-26 ENCOUNTER — Ambulatory Visit (HOSPITAL_COMMUNITY)
Admission: RE | Admit: 2021-01-26 | Discharge: 2021-01-26 | Disposition: A | Discharge status: Home / Self Care | Payer: Medicare Other | Source: Ambulatory Visit | Attending: Rheumatology | Admitting: Rheumatology

## 2021-01-26 ENCOUNTER — Other Ambulatory Visit: Payer: Self-pay

## 2021-01-26 DIAGNOSIS — M810 Age-related osteoporosis without current pathological fracture: Secondary | ICD-10-CM | POA: Diagnosis not present

## 2021-01-26 MED ORDER — DENOSUMAB 60 MG/ML ~~LOC~~ SOSY
60.0000 mg | PREFILLED_SYRINGE | Freq: Once | SUBCUTANEOUS | Status: AC
  Administered 2021-01-26: 60 mg via SUBCUTANEOUS

## 2021-01-26 MED ORDER — DENOSUMAB 60 MG/ML ~~LOC~~ SOSY
PREFILLED_SYRINGE | SUBCUTANEOUS | Status: AC
  Filled 2021-01-26: qty 1

## 2021-02-23 NOTE — Progress Notes (Signed)
Office Visit Note  Patient: Kayla [name]             Date of Birth: Jun 13, 1945           MRN: [id number]             PCP: [name], MD Referring: Shawnee Knapp, MD Visit Date: 03/08/2021 Occupation: @GUAROCC @  Subjective:  Neck pain lower back pain  History of Present Illness: Kayla [name] is a 76 y.o. female with history of osteoporosis, osteoarthritis and fibromyalgia syndrome.  She states she has been having discomfort in her neck which she describes over the trapezius region.  She is also having discomfort in her trochanteric area.  She states she is will be getting x-ray of her left hip joint soon.  She complains of some lower back discomfort.  She has generalized pain and discomfort from fibromyalgia.  She had Prolia injections on May 26, 2020 and January 26, 2021.  She states she was sick and had to delay the Prolia injection.  She had no side effects from Prolia.  She has been taking vitamin D 50,000 units once a week prescribed by her PCP.  Activities of Daily Living:  Patient reports morning stiffness for all day. Patient Reports nocturnal pain.  Difficulty dressing/grooming: Reports Difficulty climbing stairs: Reports Difficulty getting out of chair: Reports Difficulty using hands for taps, buttons, cutlery, and/or writing: Reports  Review of Systems  Constitutional:  Positive for fatigue.  HENT:  Positive for mouth dryness. Negative for mouth sores and nose dryness.   Eyes:  Positive for pain, itching and dryness.  Respiratory:  Negative for shortness of breath and difficulty breathing.   Cardiovascular:  Negative for chest pain and palpitations.  Gastrointestinal:  Positive for constipation. Negative for blood in stool and diarrhea.  Endocrine: Negative for increased urination.  Genitourinary:  Negative for difficulty urinating.  Musculoskeletal:  Positive for joint pain, joint pain, myalgias, morning stiffness, muscle tenderness and myalgias. Negative  for joint swelling.  Skin:  Negative for color change, rash and redness.  Allergic/Immunologic: Negative for susceptible to infections.  Neurological:  Positive for dizziness, numbness and headaches.  Hematological:  Positive for bruising/bleeding tendency.  Psychiatric/Behavioral:  Positive for depressed mood and sleep disturbance. Negative for confusion. The patient is nervous/anxious.    PMFS History:  Patient Active Problem List   Diagnosis Date Noted   Epigastric pain 11/11/2019   Anorexia nervosa 11/11/2019   LLQ abdominal pain 11/11/2019   Malnutrition ([place]) 11/11/2019   Mechanical breakdown implanted electronic neurostimulator spinal cord ([place]) 11/16/2018   Chronic narcotic dependence ([place]) 10/03/2018   Esophageal dysphagia 10/03/2018   Neuropathic pain 01/23/2018   MVP (mitral valve prolapse) 12/17/2017   Rosacea 11/02/2017   Chronic pain syndrome 11/02/2017   Small intestinal bacterial overgrowth 08/05/2017   Orthostatic hypotension 11/20/2016   Diabetes ([place]) 11/18/2016   Hypoglycemia 11/08/2016   Cervical facet joint syndrome 10/08/2016   Anal fissure 10/08/2016   History of genetic counseling 02/02/2016   Peripheral vascular disease ([place]) 01/22/2016   Prediabetes 01/22/2016   H/O hyperparathyroidism 01/22/2016   Mitral regurgitation 12/11/2015   H/O gastric bypass 02/11/2014   Status post bariatric surgery 02/11/2014   Chronic maxillary sinusitis 09/15/2013   Personal history of colonic polyps 07/22/2013   HA (headache)-chronic/tension type 07/28/2012   Osteoporosis 07/28/2012   Nephrolithiasis 07/28/2012   Fibromyalgia 07/26/2012   Chronic interstitial cystitis 10/16/2011   Chronic constipation 10/04/2010   WEIGHT LOSS 11/27/2009  PERSONAL HISTORY OF FAILED MODERATE SEDATION 06/01/2008   Anxiety state 03/18/2007   Reactive depression 03/18/2007   GERD 03/18/2007   Diaphragmatic hernia 03/18/2007   Osteoarthritis 03/18/2007   DISC DISEASE, CERVICAL  03/18/2007   [place] DISEASE, LUMBAR 03/18/2007   Myalgia and myositis 03/18/2007    Past Medical History:  Diagnosis Date   Allergy    Anal fissure 10/08/2016   Anemia    anemia in the past   Angina    takes Propanolol prn and it stops her chest   Anxiety    Barrett esophagus    not consistently present   Cataract    removed  but had retinal detachment - repeat cataract right eye   Chronic kidney disease    kidney stones and UTI   Complication of anesthesia    either  BP or pulse dropped with last surgery   DDD (degenerative disc disease)    cervical and lumbar   Dementia ([place])    Depression    post traumatic stress  disorder   Diabetes ([place]) 11/18/2016   Diabetes mellitus    sugar goes up and down diet controlled   Dysrhythmia    brought on by stress   External hemorrhoids    Fatigue    Fibromyalgia    neuropathy knees ankles and toes, bladder   GERD (gastroesophageal reflux disease)    H/O hyperparathyroidism    Headache(784.0)    migraines   Heart murmur    Hiatal hernia    History of kidney stones    Hypertension    3 small brain aneurysms   Hypoglycemia 11/08/2016   IBS (irritable bowel syndrome)    Internal hemorrhoids    Macular degeneration    Migraines    Mitral regurgitation 12/11/2015   Osteoarthritis    all over   Osteopetrosis    Peripheral vascular disease ([place])    superficial phlebitis in left calf   Personal history of colonic polyps 07/22/2013   07/2013 - 3 small adenomas - repeat colonoscopy 2018   Small intestinal bacterial overgrowth 08/05/2017   + lactulose breath test 06/2017 Xifaxan Rx    Family History  Problem Relation Age of Onset   Stroke Mother    Lung cancer Mother    Heart disease Mother    Diabetes Mother    Hypertension Father    Stroke Father    Heart disease Father    Kidney disease Father    Seizures Father        epilepsy, and sisters x 2   Lung cancer Sister    Breast cancer Sister    Pancreatic cancer Sister     Colon cancer Maternal Aunt        12 relatives   Colon cancer Paternal Aunt    Colon cancer Paternal 3    Healthy Daughter    Healthy Son    Uterine cancer Other        aunt   Heart disease Other        grandmother   Esophageal cancer Neg Hx    Rectal cancer Neg Hx    Stomach cancer Neg Hx    Colon polyps Neg Hx    Past Surgical History:  Procedure Laterality Date   ABDOMINAL HYSTERECTOMY     APPENDECTOMY     CARDIAC CATHETERIZATION  03/22/1991   EF 61%   CARDIOVASCULAR STRESS TEST  10/03/2006   CHOLECYSTECTOMY     COLONOSCOPY  06/23/2008   normal  DILATION AND CURETTAGE OF UTERUS     x2   ESOPHAGUS SURGERY     EXPLORATORY LAPAROTOMY  1993   EYE SURGERY     GASTRIC BYPASS  1999   GASTRIC RESTRICTION SURGERY  1991   LASIK Bilateral    Right Arm Surgery     Right Knee Arthroscopy     Rt wrist fx  2009   spinal surgery battery implant     stomach stappeling  1991   STONE EXTRACTION WITH BASKET  2012   TONSILLECTOMY     TUBAL LIGATION     UPPER GASTROINTESTINAL ENDOSCOPY  06/23/2008   w/Dil, Barrett's esophagus   US ECHOCARDIOGRAPHY  01/21/2007   EF 55-60%   US ECHOCARDIOGRAPHY  08/30/2003   EF 55-60%   Social History   Social History Narrative   Right handed   Two story home   She retired on disability   Married   2 Children   Immunization History  Administered Date(s) Administered   Hepatitis B, adult 04/21/2017   Influenza Split 10/02/2011   Influenza Whole 08/21/2007   Influenza, High Dose Seasonal PF 12/29/2017   Influenza,inj,Quad PF,6+ Mos 12/15/2013, 11/23/2014, 09/27/2015, 10/04/2016   Influenza-Unspecified 12/15/2013, 11/23/2014, 09/27/2015, 10/04/2016   PFIZER(Purple Top)SARS-COV-2 Vaccination 03/31/2019, 04/20/2019   Pneumococcal Conjugate-13 09/05/2014   Pneumococcal Polysaccharide-23 10/02/2011, 02/08/2013   Td 01/14/2006   Tdap 01/22/2016     Objective: Vital Signs: BP 105/69 (BP Location: Left Arm, Patient Position: Sitting, Cuff  Size: Normal)    Pulse 87    Ht 5\' 5"  (1.651 m)    Wt 102 lb 9.6 oz (46.5 kg)    BMI 17.07 kg/m    Physical Exam Vitals and nursing note reviewed.  Constitutional:      Appearance: She is well-developed.  HENT:     Head: Normocephalic and atraumatic.  Eyes:     Conjunctiva/sclera: Conjunctivae normal.  Cardiovascular:     Rate and Rhythm: Normal rate and regular rhythm.     Heart sounds: Normal heart sounds.  Pulmonary:     Effort: Pulmonary effort is normal.     Breath sounds: Normal breath sounds.  Abdominal:     General: Bowel sounds are normal.     Palpations: Abdomen is soft.  Musculoskeletal:     Cervical back: Normal range of motion.  Lymphadenopathy:     Cervical: No cervical adenopathy.  Skin:    General: Skin is warm and dry.     Capillary Refill: Capillary refill takes less than 2 seconds.  Neurological:     Mental Status: She is alert and oriented to person, place, and time.  Psychiatric:        Behavior: Behavior normal.     Musculoskeletal Exam: C-spine and lumbar spine were in good range of motion with discomfort.  She had bilateral trapezius spasm.  Shoulder joints elbow joints, wrist joints with good range of motion.  She had no MCP PIP or DIP synovitis.  Hip joints, knee joints, ankles, MTPs were in good range of motion with no synovitis.  She had tenderness over trochanteric bursa.  CDAI Exam: CDAI Score: -- Patient Global: --; Provider Global: -- Swollen: --; Tender: -- Joint Exam 03/08/2021   No joint exam has been documented for this visit   There is currently no information documented on the homunculus. Go to the Rheumatology activity and complete the homunculus joint exam.  Investigation: No additional findings.  Imaging: No results found.  Recent Labs: Lab Results  Component Value Date   WBC 6.6 12/04/2020   HGB 11.0 (L) 12/04/2020   PLT 293 12/04/2020   NA 143 12/04/2020   K 5.6 (H) 12/04/2020   CL 108 12/04/2020   CO2 30  12/04/2020   GLUCOSE 87 12/04/2020   BUN 24 12/04/2020   CREATININE 0.76 12/04/2020   BILITOT 0.3 12/04/2020   ALKPHOS 72 07/06/2018   AST 24 12/04/2020   ALT 18 12/04/2020   PROT 5.4 (L) 12/04/2020   ALBUMIN 4.4 07/06/2018   CALCIUM 8.7 12/04/2020   GFRAA 95 05/05/2020    Speciality Comments: Fosamax 2013-2018 Patient declined Tymlos and Forteo-travels frequently. Prolia May 26, 2020, January 26, 2021  Procedures:  No procedures performed Allergies: Ambien [zolpidem tartrate], Codeine, Oxycodone-acetaminophen, Tylox [oxycodone-acetaminophen], Darvocet [propoxyphene n-acetaminophen], Darvon [propoxyphene], Lorcet [hydrocodone-acetaminophen], Opium, Vicodin [hydrocodone-acetaminophen], Wheat bran, Amoxicillin, and Penicillins   Assessment / Plan:     Visit Diagnoses: Age-related osteoporosis without current pathological fracture - DEXA on 01/11/20: BMD measured at AP spine L1-L4 is 0.723 with a T-score of -3.8. last prolia injection: 01/26/2021,05/26/20.  She tolerated Prolia injections well without any side effects.  She has been also taking calcium and vitamin D.  Her vitamin D level was elevated.  I advised her to take vitamin D only once a month instead of weekly.  Medication management-Labs obtained on December 04, 2020 showed mild anemia, CMP was normal.  Vitamin D deficiency-vitamin D was 89 on December 04, 2020.  She has been taking vitamin D 50,000 units once a week.  I advised her to reduce the dose of vitamin D to monthly.  DDD (degenerative disc disease), cervical -she has been having increased pain and discomfort in her neck.  I will refer her to physical therapy.  Plan: Ambulatory referral to Physical Therapy  Trapezius muscle spasm-had bilateral trapezius spasm.  Stretching exercises were discussed.  I would avoid cortisone injection.  DDD (degenerative disc disease), lumbar -she has chronic pain and discomfort.  I will refer her to physical therapy.  Plan: Ambulatory  referral to Physical Therapy  Trochanteric bursitis of both hips-she had tenderness over bilateral trochanteric bursa.  IT band stretches were discussed.  Chronic pain syndrome - She uses fentanyl patches, hydrocodone, and Celebrex for pain relief.  She is also taking Lyrica as prescribed.  Fibromyalgia -she continues to have generalized pain and discomfort from fibromyalgia.  She had hyperalgesia and positive tender points.  Plan: Ambulatory referral to Physical Therapy  Other medical problems are listed as follows:  Neuropathic pain  Other fatigue  Muscle cramps  Anxiety and depression  IC (interstitial cystitis)  Hypoglycemia  History of gastroesophageal reflux (GERD)  Orders: Orders Placed This Encounter  Procedures   Ambulatory referral to Physical Therapy   No orders of the defined types were placed in this encounter.    Follow-Up Instructions: Return in about 6 months (around 09/05/2021) for Osteoporosis.   Bo Merino, MD  Note - This record has been created using Editor, commissioning.  Chart creation errors have been sought, but may not always  have been located. Such creation errors do not reflect on  the standard of medical care.

## 2021-02-24 DIAGNOSIS — Z20822 Contact with and (suspected) exposure to covid-19: Secondary | ICD-10-CM | POA: Diagnosis not present

## 2021-02-27 DIAGNOSIS — Z79891 Long term (current) use of opiate analgesic: Secondary | ICD-10-CM | POA: Diagnosis not present

## 2021-02-27 DIAGNOSIS — M810 Age-related osteoporosis without current pathological fracture: Secondary | ICD-10-CM | POA: Diagnosis not present

## 2021-02-27 DIAGNOSIS — Z9181 History of falling: Secondary | ICD-10-CM | POA: Diagnosis not present

## 2021-02-27 DIAGNOSIS — R634 Abnormal weight loss: Secondary | ICD-10-CM | POA: Diagnosis not present

## 2021-02-27 DIAGNOSIS — E875 Hyperkalemia: Secondary | ICD-10-CM | POA: Diagnosis not present

## 2021-02-27 DIAGNOSIS — M25552 Pain in left hip: Secondary | ICD-10-CM | POA: Diagnosis not present

## 2021-02-27 DIAGNOSIS — R7989 Other specified abnormal findings of blood chemistry: Secondary | ICD-10-CM | POA: Diagnosis not present

## 2021-02-27 DIAGNOSIS — M1990 Unspecified osteoarthritis, unspecified site: Secondary | ICD-10-CM | POA: Diagnosis not present

## 2021-02-27 DIAGNOSIS — R2689 Other abnormalities of gait and mobility: Secondary | ICD-10-CM | POA: Diagnosis not present

## 2021-02-28 ENCOUNTER — Other Ambulatory Visit: Payer: Self-pay

## 2021-02-28 ENCOUNTER — Ambulatory Visit (INDEPENDENT_AMBULATORY_CARE_PROVIDER_SITE_OTHER): Payer: Medicare Other | Admitting: Cardiovascular Disease

## 2021-02-28 ENCOUNTER — Encounter: Payer: Self-pay | Admitting: Cardiovascular Disease

## 2021-02-28 VITALS — BP 102/60 | HR 102 | Ht 65.0 in | Wt 99.0 lb

## 2021-02-28 DIAGNOSIS — I341 Nonrheumatic mitral (valve) prolapse: Secondary | ICD-10-CM | POA: Diagnosis not present

## 2021-02-28 NOTE — Patient Instructions (Signed)
Medication Instructions:  The current medical regimen is effective;  continue present plan and medications.  *If you need a refill on your cardiac medications before your next appointment, please call your pharmacy*  Follow-Up: At [facility], you and your health needs are our priority.  As part of our continuing mission to provide you with exceptional heart care, we have created designated Provider Care Teams.  These Care Teams include your primary Cardiologist (physician) and Advanced Practice Providers (APPs -  Physician Assistants and Nurse Practitioners) who all work together to provide you with the care you need, when you need it.  We recommend signing up for the patient portal called "MyChart".  Sign up information is provided on this After Visit Summary.  MyChart is used to connect with patients for Virtual Visits (Telemedicine).  Patients are able to view lab/test results, encounter notes, upcoming appointments, etc.  Non-urgent messages can be sent to your provider as well.   To learn more about what you can do with MyChart, go to [email or web address].    Your next appointment:   1 year(s)  The format for your next appointment:   In Person  Provider:   Nicholes Rough, PA-C, Ermalinda Barrios, PA-C, Christen Bame, NP, or Richardson Dopp, PA-C         Thank you for choosing [facility]!!

## 2021-02-28 NOTE — Progress Notes (Signed)
Kayla [name] Date of Birth  May 16, 1945       [facility] Office [address], [address], [place], [place]  [address]   [place], [place]  [address] [phone]     [phone]   Fax  [phone]    Fax [phone]  Problem List: 1. Mitral valve prolapse-mitral regurgitation 2. Hypertension 3. Barrett's esophagus 4. Fibromyalgia 5. History chest pain-she had a normal heart catheterization in 1993. Stress Myoview study in 2008 was normal. 6. Brain aneurism -  7. S/p Roux-en-Y surgery  for Barretts esophagus  Previous notes  Kayla [name] continues to have chest pain.  She takes PRN propranolol which seems to help.  It typically occurs in the afternoon.  Almost always with rest.  She does not get any regular exercise.  She has had lots of bladder infections due to kidney stones recently that have not permitted her to exercise.  She's had lots of problems with anxiety.  July 15, 2014:  Kayla [name] presents for evaluation Her BP has been very low - possibly due to the various medications .  She has had left sided chest pain for years.  Has had a normal cath in the remote past and had a normal myoview  Several years ago .   She was recently seen by Dr. Newman Pies for some palpitations The palpitations would come and go .  Off and on for hours.  Was started on atenolol which has helped the palpitations. She had taken atenolol for years ( many years ago) but was off atenolol for several years.  Her palpitations have resolved after starting the atenolol .    These episodes of tachycardia are at times associated with episodes of CP . Has lost a lot of weight over the past several years. She had a Roux -in - Y gastric bypass ( supposedly to prevent esophageal cancer from Barretts esophagutis. Is not able to eat much.  Can eat 1/4 cup of food at a time .    Sept. 19, 2016:  Continues to have palpitations  - improve with  propranolol Continues to have poor appetite.  Is generally very weak Continues to see her primary medical doctor and her pain management doctor .   Nov. 27,2017:  Seems to be doing well from a cardiac standpoint. Having urology issues.  May need a bladder tack. Still has   Nov. 7, 2018 Has been diagnosed with DM and has diabetic neuropathy.   Lots of pain in her feet.   Still has some CP Still has some palpittations  .   Has lost 40 lbs over the past 10 years.  Admitted that she is not eating well.   July 02, 2017: Follow up for MVP and noncardiac CP  Hx of HTN but has been low for the past several years.   Doing well. Now has a Freestyle Libra glucose monitor  Doing well with that  Continues to lose weight Wt is 110 lbs. Down 6 lbs from last Nov.  Not eating well.   Needs to eat more protein   December 17, 2017: Kayla [name] is seen today for follow-up visit.  She is had problems with orthostatic hypotension.  We felt that she may have some degree of dysautonomia.  It is clear that her diet is not very good. Her weight today is 101 pounds.  This is down 9 pounds from her previous visit.  Her symptoms of orthostasis  have improved Develops palpitations after eating   May 05, 2019:  Kayla [name] is seen today for follow up of her MVP and orthostatic hypotension .  Wt is 108 lbs today ( up 7 lbs from last visit ) , has some leg swelling  She took lasix yesterday  Advised her to elevated her legs and consider compression hose.  She saw her primary care yesterday  Had labs drawn yesterday  Has some mild chest pain on occasion.     Sept. 7, 2021:  Kayla [name] is seen today for follow up of her MVP and orthostatic hypotension. She has had some leg edema - we discussed leg elevation and compression hose at her last visit She has continued to have ankle  swelling  Better with the lasix and potassium  Seems to accumulate through the day  Has hx of superficial phlebitis  Still limited  by peripheral neuropathy.  Wt is 103 lbs ( down 5 lbs from last visit )    Feb. 15, 2023 Kayla [name] is seen today for MVP, orthostatic hypotension   Wt is 99 lbs ( down 4 lbs from last visit )  Lots of issues with peripheral neuropathy   Has a spina nerve stimulator.  Is not working as well as she would have hoped.   Current Outpatient Medications on File Prior to Visit  Medication Sig Dispense Refill   acarbose (PRECOSE) 50 MG tablet TAKE 1 TABLET BY MOUTH 3 TIMES A DAY WITH MEALS 270 tablet 1   aspirin-acetaminophen-caffeine (EXCEDRIN MIGRAINE) 250-250-65 MG tablet Take 2 tablets by mouth 2 (two) times daily.     Blood Glucose Monitoring Suppl (FREESTYLE FREEDOM LITE) w/Device KIT 1 each by Does not apply route 4 (four) times daily. Use to monitor glucose levels 4 times daily; E11.9 1 kit 0   CALCIUM PO Take by mouth daily.     celecoxib (CELEBREX) 200 MG capsule Take 200 mg by mouth 2 (two) times daily.     clonazePAM (KLONOPIN) 0.5 MG tablet Take 0.25 mg by mouth as needed.     COLLAGEN PO Take by mouth daily.     Continuous Blood Gluc Receiver (FREESTYLE LIBRE 14 DAY READER) DEVI 1 each by Does not apply route See admin instructions. Use with sensors to monitor glucose levels; E11.9 1 each 0   Continuous Blood Gluc Sensor (FREESTYLE LIBRE 14 DAY SENSOR) MISC 1 Device by Does not apply route every 14 (fourteen) days. 6 each 3   cyanocobalamin (,VITAMIN B-12,) 1000 MCG/ML injection SMARTSIG:1 Vial(s) IM Once a Month     cyclobenzaprine (FLEXERIL) 10 MG tablet Take 1 tablet (10 mg total) by mouth 3 (three) times daily as needed for muscle spasms. 90 day supply 270 tablet 3   cycloSPORINE (RESTASIS) 0.05 % ophthalmic emulsion Place 1 drop into both eyes 2 (two) times daily. 5.5 mL 5   diclofenac sodium (VOLTAREN) 1 % GEL Apply 2 g topically 4 (four) times daily. 700 g 3   dicyclomine (BENTYL) 10 MG capsule Take 1-2 tab 3 times daily AC as needed for spasms and cramping. 270 capsule 1    DULoxetine (CYMBALTA) 30 MG capsule Take 30 mg by mouth 2 (two) times daily.     esomeprazole (NEXIUM) 40 MG capsule Take 1 capsule (40 mg total) by mouth daily before breakfast. 90 capsule 3   famotidine (PEPCID) 20 MG tablet Take by mouth.     fentaNYL (DURAGESIC) 25 MCG/HR 1 patch every 3 (three) days.  flurbiprofen (ANSAID) 100 MG tablet Take 1 tablet (100 mg total) by mouth every 8 (eight) hours as needed (maximum 3 tablets in 24 hours). 24 tablet 3   furosemide (LASIX) 40 MG tablet Take by mouth.     Galcanezumab-gnlm (EMGALITY) 120 MG/ML SOAJ Inject 120 mg into the skin every 28 (twenty-eight) days. 3 pen 3   glucagon 1 MG injection Inject 1 mg into the muscle once as needed for up to 1 dose. 1 each 12   glucose blood (FREESTYLE TEST STRIPS) test strip Use to monitor glucose levels 4 times daily; E11.9 200 each 0   HYDROcodone-acetaminophen (NORCO) 10-325 MG tablet Take 1 tablet by mouth every 6 (six) hours as needed for moderate pain. 1/2 tablet as needed     hydrocortisone cream 1 % Apply topically.     hypromellose (GENTEAL) 0.3 % GEL ophthalmic ointment Place 1 drop into both eyes at bedtime. Reported on 02/11/2015     Lancets (FREESTYLE) lancets Use to monitor glucose levels 4 times daily; E11.9 200 each 0   latanoprost (XALATAN) 0.005 % ophthalmic solution Apply to eye.     lidocaine (LIDODERM) 5 % Place 1 patch onto the skin as needed. Remove & Discard patch within 12 hours. May dispense as 3 month supply 90 patch 3   Lifitegrast 5 % SOLN Place 1 drop into both eyes daily.     metroNIDAZOLE (METROGEL) 1 % gel Apply topically daily. 45 g 0   mirabegron ER (MYRBETRIQ) 25 MG TB24 tablet Take 1 tablet (25 mg total) by mouth daily. 90 tablet 1   Multiple Vitamins-Minerals (PRESERVISION AREDS) CAPS Take 1 capsule by mouth 2 (two) times daily.      naloxegol oxalate (MOVANTIK) 25 MG TABS tablet Take 25 mg by mouth daily.     Naloxone HCl 0.4 MG/0.4ML SOAJ Administer 0.80m [place] at first  sign of opioid overdose and repeat every 2 minutes as needed for resuscitation. Call 911 immediately 5 Package 0[place] Peripheral Neuropathy Cream- Bupivacaine 1%, Doxepin 3%, Gabapentin 6%, Pentoxifylline 3%, Topiramate 1% Apply 1-2 grams to affected area 3-4 times daily Qty. 120 gm 3 refills     nystatin-triamcinolone (MYCOLOG II) cream      ondansetron (ZOFRAN) 8 MG tablet Take 1 tablet (8 mg total) by mouth every 8 (eight) hours as needed for nausea or vomiting. 60 tablet 0   pregabalin (LYRICA) 50 MG capsule Take 536min morning and 10049mt bedtime 270 capsule 1   Probiotic Product (PROBIOTIC-10 PO) Take by mouth.      ranitidine (ZANTAC) 300 MG capsule Take by mouth.     sucralfate (CARAFATE) 1 GM/10ML suspension Take 10 mLs (1 g total) by mouth 4 (four) times daily. 420 mL 1   SUMAtriptan (IMITREX) 100 MG tablet Take 1 tablet at earliest onset of headache, may repeat in 2 hours if headache persists or reoccurs. 30 tablet 1   traZODone (DESYREL) 100 MG tablet Take by mouth.     Vitamin D, Ergocalciferol, (DRISDOL) 1.25 MG (50000 UT) CAPS capsule Take 1 capsule (50,000 Units total) by mouth every 7 (seven) days. 12 capsule 3   Current Facility-Administered Medications on File Prior to Visit  Medication Dose Route Frequency Provider Last Rate Last Admin   0.9 %  sodium chloride infusion  500 mL Intravenous Once GesGatha MayerD        Allergies  Allergen Reactions   Ambien [Zolpidem Tartrate]  Hallucinations   Codeine Anaphylaxis   Oxycodone-Acetaminophen Shortness Of Breath   Tylox [Oxycodone-Acetaminophen] Anaphylaxis    Chest pain   Darvocet [Propoxyphene N-Acetaminophen]     Chest pain   Darvon [Propoxyphene] Itching   Lorcet [Hydrocodone-Acetaminophen]     Chest pain   Opium     hallucinations   Vicodin [Hydrocodone-Acetaminophen] Other (See Comments)    Chest Pain    Wheat Bran    Amoxicillin Nausea And Vomiting    Reaction:   Migraine headache   Penicillins Nausea And Vomiting    Migraine Headaches    Past Medical History:  Diagnosis Date   Allergy    Anal fissure 10/08/2016   Anemia    anemia in the past   Angina    takes Propanolol prn and it stops her chest   Anxiety    Barrett esophagus    not consistently present   Cataract    removed  but had retinal detachment - repeat cataract right eye   Chronic kidney disease    kidney stones and UTI   Complication of anesthesia    either  BP or pulse dropped with last surgery   DDD (degenerative disc disease)    cervical and lumbar   Dementia ([place])    Depression    post traumatic stress  disorder   Diabetes ([place]) 11/18/2016   Diabetes mellitus    sugar goes up and down diet controlled   Dysrhythmia    brought on by stress   External hemorrhoids    Fatigue    Fibromyalgia    neuropathy knees ankles and toes, bladder   GERD (gastroesophageal reflux disease)    H/O hyperparathyroidism    Headache(784.0)    migraines   Heart murmur    Hiatal hernia    History of kidney stones    Hypertension    3 small brain aneurysms   Hypoglycemia 11/08/2016   IBS (irritable bowel syndrome)    Internal hemorrhoids    Macular degeneration    Migraines    Mitral regurgitation 12/11/2015   Osteoarthritis    all over   Osteopetrosis    Peripheral vascular disease ([place])    superficial phlebitis in left calf   Personal history of colonic polyps 07/22/2013   07/2013 - 3 small adenomas - repeat colonoscopy 2018   Small intestinal bacterial overgrowth 08/05/2017   + lactulose breath test 06/2017 Xifaxan Rx    Past Surgical History:  Procedure Laterality Date   ABDOMINAL HYSTERECTOMY     APPENDECTOMY     CARDIAC CATHETERIZATION  03/22/1991   EF 61%   CARDIOVASCULAR STRESS TEST  10/03/2006   CHOLECYSTECTOMY     COLONOSCOPY  06/23/2008   normal   DILATION AND CURETTAGE OF UTERUS     x2   ESOPHAGUS SURGERY     EXPLORATORY LAPAROTOMY  1993   EYE SURGERY      GASTRIC BYPASS  1999   GASTRIC RESTRICTION SURGERY  1991   LASIK Bilateral    Right Arm Surgery     Right Knee Arthroscopy     Rt wrist fx  2009   spinal surgery battery implant     stomach stappeling  1991   STONE EXTRACTION WITH BASKET  2012   TONSILLECTOMY     TUBAL LIGATION     UPPER GASTROINTESTINAL ENDOSCOPY  06/23/2008   w/Dil, Barrett's esophagus   US ECHOCARDIOGRAPHY  01/21/2007   EF 55-60%   US ECHOCARDIOGRAPHY  08/30/2003   EF  55-60%    Social History   Tobacco Use  Smoking Status Never  Smokeless Tobacco Never    Social History   Substance and Sexual Activity  Alcohol Use No    Family History  Problem Relation Age of Onset   Stroke Mother    Lung cancer Mother    Heart disease Mother    Diabetes Mother    Hypertension Father    Stroke Father    Heart disease Father    Kidney disease Father    Seizures Father        epilepsy, and sisters x 2   Lung cancer Sister    Breast cancer Sister    Pancreatic cancer Sister    Colon cancer Maternal Aunt        12 relatives   Colon cancer Paternal Aunt    Colon cancer Paternal 25    Healthy Daughter    Healthy Son    Uterine cancer Other        aunt   Heart disease Other        grandmother   Esophageal cancer Neg Hx    Rectal cancer Neg Hx    Stomach cancer Neg Hx    Colon polyps Neg Hx     Reviw of Systems:  Reviewed in the HPI.  All other systems are negative.    Physical Exam: Blood pressure 102/60, pulse (!) 102, height _0  (1.651 m), weight 99 lb (44.9 kg), SpO2 93 %.  GEN:  Well nourished, well developed in no acute distress HEENT: Normal NECK: No JVD; No carotid bruits LYMPHATICS: No lymphadenopathy CARDIAC: RRR , soft systolic murmur  RESPIRATORY:  Clear to auscultation without rales, wheezing or rhonchi  ABDOMEN: Soft, non-tender, non-distended MUSCULOSKELETAL:  No edema; No deformity  SKIN: Warm and dry NEUROLOGIC:  Alert and oriented x 3    ECG: February 28, 2021: Sinus  tachycardia at 102.  No ST or T wave changes.    Assessment / Plan:   1.  Orthostatic hypotension:   stable     2.  Chest pain:    has chostochrondritis.  Non cardiac   3.    Leg swelling :    stable    4.  MVP :  stable  Have her follow up in 1 year with APP or me   Mertie Moores, MD  02/28/2021 10:58 AM    [place] [address],  [place], [place]  [address] Pager [phone] Phone: [phone]; Fax: [phone]

## 2021-03-01 DIAGNOSIS — H0102A Squamous blepharitis right eye, upper and lower eyelids: Secondary | ICD-10-CM | POA: Diagnosis not present

## 2021-03-01 DIAGNOSIS — H04123 Dry eye syndrome of bilateral lacrimal glands: Secondary | ICD-10-CM | POA: Diagnosis not present

## 2021-03-01 DIAGNOSIS — H02052 Trichiasis without entropian right lower eyelid: Secondary | ICD-10-CM | POA: Diagnosis not present

## 2021-03-07 ENCOUNTER — Encounter: Payer: Medicare Other | Admitting: Psychology

## 2021-03-08 ENCOUNTER — Ambulatory Visit (INDEPENDENT_AMBULATORY_CARE_PROVIDER_SITE_OTHER): Payer: Medicare Other | Admitting: Rheumatology

## 2021-03-08 ENCOUNTER — Other Ambulatory Visit: Payer: Self-pay

## 2021-03-08 ENCOUNTER — Encounter: Payer: Self-pay | Admitting: Rheumatology

## 2021-03-08 VITALS — BP 105/69 | HR 87 | Ht 65.0 in | Wt 102.6 lb

## 2021-03-08 DIAGNOSIS — F419 Anxiety disorder, unspecified: Secondary | ICD-10-CM

## 2021-03-08 DIAGNOSIS — M810 Age-related osteoporosis without current pathological fracture: Secondary | ICD-10-CM

## 2021-03-08 DIAGNOSIS — M797 Fibromyalgia: Secondary | ICD-10-CM | POA: Diagnosis not present

## 2021-03-08 DIAGNOSIS — M7061 Trochanteric bursitis, right hip: Secondary | ICD-10-CM

## 2021-03-08 DIAGNOSIS — F32A Depression, unspecified: Secondary | ICD-10-CM

## 2021-03-08 DIAGNOSIS — M7062 Trochanteric bursitis, left hip: Secondary | ICD-10-CM

## 2021-03-08 DIAGNOSIS — E559 Vitamin D deficiency, unspecified: Secondary | ICD-10-CM | POA: Diagnosis not present

## 2021-03-08 DIAGNOSIS — M62838 Other muscle spasm: Secondary | ICD-10-CM | POA: Diagnosis not present

## 2021-03-08 DIAGNOSIS — G894 Chronic pain syndrome: Secondary | ICD-10-CM | POA: Diagnosis not present

## 2021-03-08 DIAGNOSIS — E162 Hypoglycemia, unspecified: Secondary | ICD-10-CM

## 2021-03-08 DIAGNOSIS — R252 Cramp and spasm: Secondary | ICD-10-CM

## 2021-03-08 DIAGNOSIS — M792 Neuralgia and neuritis, unspecified: Secondary | ICD-10-CM

## 2021-03-08 DIAGNOSIS — R5383 Other fatigue: Secondary | ICD-10-CM

## 2021-03-08 DIAGNOSIS — M5136 Other intervertebral disc degeneration, lumbar region: Secondary | ICD-10-CM | POA: Diagnosis not present

## 2021-03-08 DIAGNOSIS — M5030 Other cervical disc degeneration, unspecified cervical region: Secondary | ICD-10-CM

## 2021-03-08 DIAGNOSIS — Z79899 Other long term (current) drug therapy: Secondary | ICD-10-CM

## 2021-03-08 DIAGNOSIS — Z8719 Personal history of other diseases of the digestive system: Secondary | ICD-10-CM

## 2021-03-08 DIAGNOSIS — N3010 Interstitial cystitis (chronic) without hematuria: Secondary | ICD-10-CM

## 2021-03-12 DIAGNOSIS — Z20822 Contact with and (suspected) exposure to covid-19: Secondary | ICD-10-CM | POA: Diagnosis not present

## 2021-03-15 DIAGNOSIS — M19029 Primary osteoarthritis, unspecified elbow: Secondary | ICD-10-CM | POA: Diagnosis not present

## 2021-03-15 DIAGNOSIS — M19019 Primary osteoarthritis, unspecified shoulder: Secondary | ICD-10-CM | POA: Diagnosis not present

## 2021-03-15 DIAGNOSIS — M25552 Pain in left hip: Secondary | ICD-10-CM | POA: Diagnosis not present

## 2021-03-15 DIAGNOSIS — G629 Polyneuropathy, unspecified: Secondary | ICD-10-CM | POA: Diagnosis not present

## 2021-03-15 DIAGNOSIS — R636 Underweight: Secondary | ICD-10-CM | POA: Diagnosis not present

## 2021-03-15 DIAGNOSIS — M797 Fibromyalgia: Secondary | ICD-10-CM | POA: Diagnosis not present

## 2021-03-16 ENCOUNTER — Encounter: Payer: Self-pay | Admitting: Physical Medicine & Rehabilitation

## 2021-03-16 DIAGNOSIS — M5030 Other cervical disc degeneration, unspecified cervical region: Secondary | ICD-10-CM | POA: Diagnosis not present

## 2021-03-16 DIAGNOSIS — I1311 Hypertensive heart and chronic kidney disease without heart failure, with stage 5 chronic kidney disease, or end stage renal disease: Secondary | ICD-10-CM | POA: Diagnosis not present

## 2021-03-16 DIAGNOSIS — D519 Vitamin B12 deficiency anemia, unspecified: Secondary | ICD-10-CM | POA: Diagnosis not present

## 2021-03-16 DIAGNOSIS — H3530 Unspecified macular degeneration: Secondary | ICD-10-CM | POA: Diagnosis not present

## 2021-03-16 DIAGNOSIS — G43009 Migraine without aura, not intractable, without status migrainosus: Secondary | ICD-10-CM | POA: Diagnosis not present

## 2021-03-16 DIAGNOSIS — K769 Liver disease, unspecified: Secondary | ICD-10-CM | POA: Diagnosis not present

## 2021-03-16 DIAGNOSIS — M810 Age-related osteoporosis without current pathological fracture: Secondary | ICD-10-CM | POA: Diagnosis not present

## 2021-03-16 DIAGNOSIS — E875 Hyperkalemia: Secondary | ICD-10-CM | POA: Diagnosis not present

## 2021-03-16 DIAGNOSIS — F32A Depression, unspecified: Secondary | ICD-10-CM | POA: Diagnosis not present

## 2021-03-16 DIAGNOSIS — M5136 Other intervertebral disc degeneration, lumbar region: Secondary | ICD-10-CM | POA: Diagnosis not present

## 2021-03-16 DIAGNOSIS — J449 Chronic obstructive pulmonary disease, unspecified: Secondary | ICD-10-CM | POA: Diagnosis not present

## 2021-03-16 DIAGNOSIS — N186 End stage renal disease: Secondary | ICD-10-CM | POA: Diagnosis not present

## 2021-03-16 DIAGNOSIS — K449 Diaphragmatic hernia without obstruction or gangrene: Secondary | ICD-10-CM | POA: Diagnosis not present

## 2021-03-16 DIAGNOSIS — M797 Fibromyalgia: Secondary | ICD-10-CM | POA: Diagnosis not present

## 2021-03-16 DIAGNOSIS — D849 Immunodeficiency, unspecified: Secondary | ICD-10-CM | POA: Diagnosis not present

## 2021-03-16 DIAGNOSIS — I341 Nonrheumatic mitral (valve) prolapse: Secondary | ICD-10-CM | POA: Diagnosis not present

## 2021-03-16 DIAGNOSIS — D509 Iron deficiency anemia, unspecified: Secondary | ICD-10-CM | POA: Diagnosis not present

## 2021-03-16 DIAGNOSIS — E46 Unspecified protein-calorie malnutrition: Secondary | ICD-10-CM | POA: Diagnosis not present

## 2021-03-16 DIAGNOSIS — E1165 Type 2 diabetes mellitus with hyperglycemia: Secondary | ICD-10-CM | POA: Diagnosis not present

## 2021-03-16 DIAGNOSIS — H409 Unspecified glaucoma: Secondary | ICD-10-CM | POA: Diagnosis not present

## 2021-03-16 DIAGNOSIS — M1612 Unilateral primary osteoarthritis, left hip: Secondary | ICD-10-CM | POA: Diagnosis not present

## 2021-03-16 DIAGNOSIS — E1142 Type 2 diabetes mellitus with diabetic polyneuropathy: Secondary | ICD-10-CM | POA: Diagnosis not present

## 2021-03-16 DIAGNOSIS — G4700 Insomnia, unspecified: Secondary | ICD-10-CM | POA: Diagnosis not present

## 2021-03-16 DIAGNOSIS — Z7984 Long term (current) use of oral hypoglycemic drugs: Secondary | ICD-10-CM | POA: Diagnosis not present

## 2021-03-16 DIAGNOSIS — Z79899 Other long term (current) drug therapy: Secondary | ICD-10-CM | POA: Diagnosis not present

## 2021-03-21 DIAGNOSIS — M1612 Unilateral primary osteoarthritis, left hip: Secondary | ICD-10-CM | POA: Diagnosis not present

## 2021-03-21 DIAGNOSIS — N186 End stage renal disease: Secondary | ICD-10-CM | POA: Diagnosis not present

## 2021-03-21 DIAGNOSIS — E1165 Type 2 diabetes mellitus with hyperglycemia: Secondary | ICD-10-CM | POA: Diagnosis not present

## 2021-03-21 DIAGNOSIS — I1311 Hypertensive heart and chronic kidney disease without heart failure, with stage 5 chronic kidney disease, or end stage renal disease: Secondary | ICD-10-CM | POA: Diagnosis not present

## 2021-03-21 DIAGNOSIS — J449 Chronic obstructive pulmonary disease, unspecified: Secondary | ICD-10-CM | POA: Diagnosis not present

## 2021-03-21 DIAGNOSIS — E1142 Type 2 diabetes mellitus with diabetic polyneuropathy: Secondary | ICD-10-CM | POA: Diagnosis not present

## 2021-03-22 ENCOUNTER — Encounter: Payer: Self-pay | Admitting: Psychology

## 2021-03-22 DIAGNOSIS — N3000 Acute cystitis without hematuria: Secondary | ICD-10-CM | POA: Diagnosis not present

## 2021-03-22 DIAGNOSIS — F4310 Post-traumatic stress disorder, unspecified: Secondary | ICD-10-CM | POA: Insufficient documentation

## 2021-03-22 DIAGNOSIS — N189 Chronic kidney disease, unspecified: Secondary | ICD-10-CM | POA: Insufficient documentation

## 2021-03-22 DIAGNOSIS — H3530 Unspecified macular degeneration: Secondary | ICD-10-CM | POA: Insufficient documentation

## 2021-03-23 ENCOUNTER — Other Ambulatory Visit: Payer: Self-pay

## 2021-03-23 ENCOUNTER — Encounter: Payer: Self-pay | Admitting: Psychology

## 2021-03-23 ENCOUNTER — Ambulatory Visit: Payer: Medicare Other

## 2021-03-23 ENCOUNTER — Ambulatory Visit (INDEPENDENT_AMBULATORY_CARE_PROVIDER_SITE_OTHER): Payer: Medicare Other | Admitting: Psychology

## 2021-03-23 DIAGNOSIS — M797 Fibromyalgia: Secondary | ICD-10-CM

## 2021-03-23 DIAGNOSIS — F4310 Post-traumatic stress disorder, unspecified: Secondary | ICD-10-CM | POA: Diagnosis not present

## 2021-03-23 DIAGNOSIS — F411 Generalized anxiety disorder: Secondary | ICD-10-CM | POA: Diagnosis not present

## 2021-03-23 DIAGNOSIS — F331 Major depressive disorder, recurrent, moderate: Secondary | ICD-10-CM

## 2021-03-23 DIAGNOSIS — R4189 Other symptoms and signs involving cognitive functions and awareness: Secondary | ICD-10-CM | POA: Diagnosis not present

## 2021-03-23 NOTE — Progress Notes (Signed)
? ?  Psychometrician Note ?  ?Cognitive testing was administered to [name] by Kayla [name], B.S. (psychometrist) under the supervision of Dr. Christia Reading, Ph.D., licensed psychologist on 03/23/2021. Kayla [name] did not appear overtly distressed by the testing session per behavioral observation or responses across self-report questionnaires. Rest breaks were offered.  ?  ?The battery of tests administered was selected by Dr. Christia Reading, Ph.D. with consideration to Kayla [name]'s current level of functioning, the nature of her symptoms, emotional and behavioral responses during interview, level of literacy, observed level of motivation/effort, and the nature of the referral question. This battery was communicated to the psychometrist. Communication between Dr. Christia Reading, Ph.D. and the psychometrist was ongoing throughout the evaluation and Dr. Christia Reading, Ph.D. was immediately accessible at all times. Dr. Christia Reading, Ph.D. provided supervision to the psychometrist on the date of this service to the extent necessary to assure the quality of all services provided.  ?  ?Kayla [name] will return within approximately 1-2 weeks for an interactive feedback session with Dr. Melvyn Novas at which time her test performances, clinical impressions, and treatment recommendations will be reviewed in detail. Kayla [name] understands she can contact our office should she require our assistance before this time. ? ?A total of 140 minutes of billable time were spent face-to-face with Kayla [name] by the psychometrist. This includes both test administration and scoring time. Billing for these services is reflected in the clinical report generated by Dr. Christia Reading, Ph.D. ? ?This note reflects time spent with the psychometrician and does not include test scores or any clinical interpretations made by Dr. Melvyn Novas. The full report will follow in a separate note.  ?

## 2021-03-23 NOTE — Progress Notes (Signed)
NEUROPSYCHOLOGICAL EVALUATION [place] [place] Department of Neurology  Date of Evaluation: March 23, 2021  Reason for Referral:   Kayla [name] is a 76 y.o. right-handed Caucasian female referred by Metta Clines, D.O., to characterize her current cognitive functioning and assist with diagnostic clarity and treatment planning in the context of subjective cognitive decline and various psychiatric comorbidities.   Assessment and Plan:   Clinical Impression(s): Kayla [name] pattern of performance is suggestive of neuropsychological functioning within normal limits. No degree of weakness/impairment was exhibited across testing. Performances were consistently appropriate across all assessed cognitive domains. This includes processing speed, attention/concentration, executive functioning, receptive and expressive language, visuospatial abilities, and all aspects of learning and memory. Kayla [name] denied difficulties completing instrumental activities of daily living (ADLs) independently.  Across mood-related questionnaires, Kayla [name] reported acute symptoms of moderate and anxiety and moderate depression occurring during the past 1-2 weeks. Across a more comprehensive personality questionnaire, she elevated clinical subscales surrounding somatic complains and anxiety-related disorders. She also had near-elevations across anxiety and depression subscales. Responses across this lengthy questionnaire suggest that Kayla [name] has significant and severe ruminative thoughts regarding her physical functioning. Various health matters are likely to be chronic and accompanied by fatigue and weakness that may render her incapable of performing some activities. She is likely to report that her health is not as good as her peers and is quite difficult to treat adequately. Her self-image may be largely influenced by a belief that she is handicapped by her poor health. Responses also  suggest significant symptoms related to traumatic stress (i.e., PTSD) and that these past experiences continue to distress her and produce recurring episodes of anxiety. Overall, her report of subjective cognitive dysfunction is most likely related to ongoing psychiatric distress (i.e., anxiety, depression, PTSD), a strong mental preoccupation with somatic symptoms (i.e., frequent headaches, neuropathy, fibromyalgia), and ongoing sleep dysfunction.   Regarding her held concerns surrounding Alzheimer's disease, her memory was strong and she does not display any patterns concerning for the presence of this illness at the present time. It should be highlighted that her primary memory complaints generally focused on more long-term concerns. Whereas short-term memory is severely affected in this disease process, long-term memory dysfunction is not a feature of Alzheimer's disease and is generally preserved until later stages of this illness. Reported long-term memory dysfunction is more closely related to an underlying psychiatric etiology rather than a neurological one. Outside of Alzheimer's disease, her cognitive and behavioral profile is not suggestive of any other form of neurodegenerative illness presently.  Recommendations: She is encouraged to continue working with members of her medical team in treating ongoing somatic complaints. She should also discuss her sleep dysfunction with these individuals to see if this can be more directly treated and better managed.   A combination of medication and psychotherapy has been shown to be most effective at treating symptoms of anxiety, depression, and PTSD. As such, Kayla [name] is encouraged to speak with her prescribing physician regarding medication adjustments to optimally manage these symptoms.   Likewise, Kayla [name] is encouraged to consider engaging in short-term psychotherapy to address symptoms of psychiatric distress. She would benefit from an active  and collaborative therapeutic environment, rather than one purely supportive in nature. Recommended treatment modalities include Cognitive Behavioral Therapy (CBT) or Acceptance and Commitment Therapy (ACT).  Kayla [name] is encouraged to attend to lifestyle factors for brain health (e.g., regular physical exercise, good nutrition habits, regular participation in cognitively-stimulating activities,  and general stress management techniques), which are likely to have benefits for both emotional adjustment and cognition. In fact, in addition to promoting good general health, regular exercise incorporating aerobic activities (e.g., brisk walking, jogging, cycling, etc.) has been demonstrated to be a very effective treatment for depression and stress, with similar efficacy rates to both antidepressant medication and psychotherapy. Optimal control of vascular risk factors (including safe cardiovascular exercise and adherence to dietary recommendations) is encouraged. Continued participation in activities which provide mental stimulation and social interaction is also recommended.   Memory can be improved using internal strategies such as rehearsal, repetition, chunking, mnemonics, association, and imagery. External strategies such as written notes in a consistently used memory journal, visual and nonverbal auditory cues such as a calendar on the refrigerator or appointments with alarm, such as on a cell phone, can also help maximize recall.    Review of Records:   Kayla [name] was seen by [facility] Metta Clines, D.O.) on 09/19/2020 for follow-up of migraine headaches and polyneuropathy. Briefly, she has described increased swelling and burning numbness and tingling in the legs since July 2018. She had lower extremity vascular ABIs on 08/20/2016, which were negative. There is a history of B12 deficiency but she takes injections and recent B12 levels from 09/21/2016 was over 2000. Methylmalonic acid level was 209,  RPR nonreactive, homocysteine 7.8. Labs from 07/12/2016 included normal SPEP, Sed Rate 2, CRP less than 0.3, and TSH 2.430. Vitamin D was 23 and she was advised to start supplementation. Serum glucose has been 70s up to 110. She also takes B6. There is a history of fibromyalgia and was previously diagnosed with neuropathy by another neurologist. She has a spinal cord stimulator and has been on gabapentin for many years. It was briefly discontinued to see if it was contributing to the swelling; however, she reported no significant change and it was restarted. NCV-EMG from 10/21/2016 demonstrated chronic sensorimotor polyneuropathy of the predominantly axonal type as well as mild left median neuropathy at or distal to the wrist.  Other labs include B1 146.1, B6 10.7, Sed Rate 2, HIV negative, TSH 1.780, UPEP negative. She reported wondering if her neuropathy could be secondary to [employer] exposure. Her husband was a [place] veteran and she described purported cases of spouses exposed to [employer] through their husband's semen. Her neuropathic symptoms started shortly after his return. During the current appointment with Dr. Tomi Likens, medications for ongoing headache and pain symptoms were adjusted. There was no mention of any cognitive complaints or dysfunction.  Kayla [name] called Dr. Georgie Chard office on 09/27/2020 with cognitive complaints. Examples included losing time and not recalling her daughter staying in her house during the past year. She also noted trouble remembering past conversations or what she has recently read. Ultimately, Kayla [name] was referred for a comprehensive neuropsychological evaluation to characterize her cognitive abilities and to assist with diagnostic clarity and treatment planning.   Brain MRI on 03/06/2017 revealed nonspecific T2 hyperintensities in the bifrontal lobes, said to reflect either sequelae of migraine headaches or chronic microvascular ischemic changes. Brain MRI on  11/23/2020 revealed stable hyperintensities, as well as mild generalized atrophy.   Past Medical History:  Diagnosis Date   Adjustment disorder with depressed mood 03/18/2007   Allergy    Anal fissure 10/08/2016   Angina    takes Propanolol prn and it stops her chest   Anorexia nervosa 11/11/2019   Barrett esophagus    not consistently present   Cataract  removed  but had retinal detachment - repeat cataract right eye   Cervical facet joint syndrome 10/08/2016   Chronic constipation 10/04/2010   Chronic interstitial cystitis 10/16/2011   Chronic kidney disease    kidney stones and UTI   Chronic maxillary sinusitis 09/15/2013   Chronic narcotic dependence 14/43/1540   Complication of anesthesia    either  BP or pulse dropped with last surgery   Degeneration of cervical intervertebral disc 03/18/2007   Degeneration of lumbar or lumbosacral intervertebral disc 03/18/2007   Diaphragmatic hernia 03/18/2007   Dysrhythmia    brought on by stress   Epigastric pain 11/11/2019   Esophageal dysphagia 10/03/2018   External hemorrhoids    Fibromyalgia    neuropathy knees ankles and toes, bladder   Generalized anxiety disorder 03/18/2007   GERD (gastroesophageal reflux disease) 03/18/2007   HA (headache)-chronic/tension type 07/28/2012   Heart murmur    Hiatal hernia    History of anemia    History of gastric bypass 02/11/2014   1999 at [facility]   History of hyperparathyroidism 01/22/2016   History of kidney stones    Hypertension    Hypoglycemia 11/08/2016   IBS (irritable bowel syndrome)    Internal hemorrhoids    LLQ abdominal pain 11/11/2019   Macular degeneration    Malnutrition 11/11/2019   Mechanical breakdown implanted electronic neurostimulator spinal cord 11/16/2018   Mitral regurgitation 12/11/2015   MVP (mitral valve prolapse) 12/17/2017   Myalgia 03/18/2007   Nephrolithiasis 07/28/2012   lithotrip 2013-[place],MD   Neuropathic pain 01/23/2018    Orthostatic hypotension 11/20/2016   Osteoarthritis    all over   Osteopetrosis    Osteoporosis 07/28/2012   Peripheral vascular disease    superficial phlebitis in left calf   Personal history of colonic polyps 07/22/2013   07/2013 - 3 small adenomas - repeat colonoscopy 2018   PTSD (post-traumatic stress disorder)    Rosacea 11/02/2017   Small intestinal bacterial overgrowth 08/05/2017   + lactulose breath test 06/2017 Xifaxan Rx   Type II diabetes mellitus 11/18/2016    Past Surgical History:  Procedure Laterality Date   ABDOMINAL HYSTERECTOMY     APPENDECTOMY     CARDIAC CATHETERIZATION  03/22/1991   EF 61%   CARDIOVASCULAR STRESS TEST  10/03/2006   CHOLECYSTECTOMY     COLONOSCOPY  06/23/2008   normal   DILATION AND CURETTAGE OF UTERUS     x2   ESOPHAGUS SURGERY     EXPLORATORY LAPAROTOMY  1993   EYE SURGERY     GASTRIC BYPASS  1999   GASTRIC RESTRICTION SURGERY  1991   LASIK Bilateral    Right Arm Surgery     Right Knee Arthroscopy     Rt wrist fx  2009   spinal surgery battery implant     stomach stappeling  1991   STONE EXTRACTION WITH BASKET  2012   TONSILLECTOMY     TUBAL LIGATION     UPPER GASTROINTESTINAL ENDOSCOPY  06/23/2008   w/Dil, Barrett's esophagus   US ECHOCARDIOGRAPHY  01/21/2007   EF 55-60%   US ECHOCARDIOGRAPHY  08/30/2003   EF 55-60%    Current Outpatient Medications:    acarbose (PRECOSE) 50 MG tablet, TAKE 1 TABLET BY MOUTH 3 TIMES A DAY WITH MEALS, Disp: 270 tablet, Rfl: 1   aspirin-acetaminophen-caffeine (EXCEDRIN MIGRAINE) 250-250-65 MG tablet, Take 2 tablets by mouth 2 (two) times daily., Disp: , Rfl:    Blood Glucose Monitoring Suppl (FREESTYLE FREEDOM LITE) w/Device KIT,  1 each by Does not apply route 4 (four) times daily. Use to monitor glucose levels 4 times daily; E11.9, Disp: 1 kit, Rfl: 0   CALCIUM PO, Take by mouth daily., Disp: , Rfl:    celecoxib (CELEBREX) 200 MG capsule, Take 200 mg by mouth 2 (two) times daily., Disp: , Rfl:     clonazePAM (KLONOPIN) 0.5 MG tablet, Take 0.25 mg by mouth as needed., Disp: , Rfl:    COLLAGEN PO, Take by mouth daily., Disp: , Rfl:    Continuous Blood Gluc Receiver (FREESTYLE LIBRE [place]) [place], 1 each by Does not apply route See admin instructions. Use with sensors to monitor glucose levels; E11.9, Disp: 1 each, Rfl: 0   Continuous Blood Gluc Sensor (FREESTYLE LIBRE 14 DAY SENSOR) MISC, 1 Device by Does not apply route every 14 (fourteen) days., Disp: 6 each, Rfl: 3   cyanocobalamin (,VITAMIN B-12,) 1000 MCG/ML injection, SMARTSIG:1 Vial(s) IM Once a Month, Disp: , Rfl:    cyclobenzaprine (FLEXERIL) 10 MG tablet, Take 1 tablet (10 mg total) by mouth 3 (three) times daily as needed for muscle spasms. 90 day supply, Disp: 270 tablet, Rfl: 3   cycloSPORINE (RESTASIS) 0.05 % ophthalmic emulsion, Place 1 drop into both eyes 2 (two) times daily., Disp: 5.5 mL, Rfl: 5   diclofenac sodium (VOLTAREN) 1 % GEL, Apply 2 g topically 4 (four) times daily., Disp: 700 g, Rfl: 3   dicyclomine (BENTYL) 10 MG capsule, Take 1-2 tab 3 times daily AC as needed for spasms and cramping., Disp: 270 capsule, Rfl: 1   DULoxetine (CYMBALTA) 30 MG capsule, Take 30 mg by mouth 2 (two) times daily., Disp: , Rfl:    esomeprazole (NEXIUM) 40 MG capsule, Take 1 capsule (40 mg total) by mouth daily before breakfast., Disp: 90 capsule, Rfl: 3   famotidine (PEPCID) 20 MG tablet, Take by mouth., Disp: , Rfl:    fentaNYL (DURAGESIC) 25 MCG/HR, 1 patch every 3 (three) days., Disp: , Rfl:    flurbiprofen (ANSAID) 100 MG tablet, Take 1 tablet (100 mg total) by mouth every 8 (eight) hours as needed (maximum 3 tablets in 24 hours)., Disp: 24 tablet, Rfl: 3   furosemide (LASIX) 40 MG tablet, Take by mouth., Disp: , Rfl:    Galcanezumab-gnlm (EMGALITY) 120 MG/ML SOAJ, Inject 120 mg into the skin every 28 (twenty-eight) days., Disp: 3 pen, Rfl: 3   glucagon 1 MG injection, Inject 1 mg into the muscle once as needed for up to 1 dose.,  Disp: 1 each, Rfl: 12   glucose blood (FREESTYLE TEST STRIPS) test strip, Use to monitor glucose levels 4 times daily; E11.9, Disp: 200 each, Rfl: 0   HYDROcodone-acetaminophen (NORCO) 10-325 MG tablet, Take 1 tablet by mouth every 6 (six) hours as needed for moderate pain. 1/2 tablet as needed, Disp: , Rfl:    hydrocortisone cream 1 %, Apply topically., Disp: , Rfl:    hypromellose (GENTEAL) 0.3 % GEL ophthalmic ointment, Place 1 drop into both eyes at bedtime. Reported on 02/11/2015, Disp: , Rfl:    Lancets (FREESTYLE) lancets, Use to monitor glucose levels 4 times daily; E11.9, Disp: 200 each, Rfl: 0   latanoprost (XALATAN) 0.005 % ophthalmic solution, Apply to eye., Disp: , Rfl:    lidocaine (LIDODERM) 5 %, Place 1 patch onto the skin as needed. Remove & Discard patch within 12 hours. May dispense as 3 month supply, Disp: 90 patch, Rfl: 3   Lifitegrast 5 % SOLN, Place 1 drop  into both eyes daily., Disp: , Rfl:    metroNIDAZOLE (METROGEL) 1 % gel, Apply topically daily., Disp: 45 g, Rfl: 0   mirabegron ER (MYRBETRIQ) 25 MG TB24 tablet, Take 1 tablet (25 mg total) by mouth daily., Disp: 90 tablet, Rfl: 1   Multiple Vitamins-Minerals (PRESERVISION AREDS) CAPS, Take 1 capsule by mouth 2 (two) times daily. , Disp: , Rfl:    naloxegol oxalate (MOVANTIK) 25 MG TABS tablet, Take 25 mg by mouth daily., Disp: , Rfl:    Naloxone HCl 0.4 MG/0.4ML SOAJ, Administer 0.56m [place] at first sign of opioid overdose and repeat every 2 minutes as needed for resuscitation. Call 911 immediately, Disp: 5 Package, Rfl: 0   NON FORMULARY, [facility]  Peripheral Neuropathy Cream- Bupivacaine 1%, Doxepin 3%, Gabapentin 6%, Pentoxifylline 3%, Topiramate 1% Apply 1-2 grams to affected area 3-4 times daily Qty. 120 gm 3 refills, Disp: , Rfl:    NONFORMULARY OR COMPOUNDED ITEM, GABA6%/LIDO5%/DICL3%- APPLY TO PAINFUL JOINTS AND BACK 3 TIMES PER DAY AS NEEDED., Disp: , Rfl:    nystatin-triamcinolone (MYCOLOG II) cream, , Disp:  , Rfl:    ondansetron (ZOFRAN) 8 MG tablet, Take 1 tablet (8 mg total) by mouth every 8 (eight) hours as needed for nausea or vomiting., Disp: 60 tablet, Rfl: 0   pregabalin (LYRICA) 50 MG capsule, Take 516min morning and 10043mt bedtime, Disp: 270 capsule, Rfl: 1   Probiotic Product (PROBIOTIC-10 PO), Take by mouth. , Disp: , Rfl:    ranitidine (ZANTAC) 300 MG capsule, Take by mouth., Disp: , Rfl:    sucralfate (CARAFATE) 1 GM/10ML suspension, Take 10 mLs (1 g total) by mouth 4 (four) times daily., Disp: 420 mL, Rfl: 1   SUMAtriptan (IMITREX) 100 MG tablet, Take 1 tablet at earliest onset of headache, may repeat in 2 hours if headache persists or reoccurs., Disp: 30 tablet, Rfl: 1   traZODone (DESYREL) 100 MG tablet, Take by mouth., Disp: , Rfl:    Vitamin D, Ergocalciferol, (DRISDOL) 1.25 MG (50000 UT) CAPS capsule, Take 1 capsule (50,000 Units total) by mouth every 7 (seven) days., Disp: 12 capsule, Rfl: 3  Current Facility-Administered Medications:    0.9 %  sodium chloride infusion, 500 mL, Intravenous, Once, GesCarlean PurlrOfilia NeasD  Clinical Interview:   The following information was obtained during a clinical interview with Kayla [name] and her husband prior to cognitive testing.  Cognitive Symptoms: Decreased short-term memory: Endorsed. Primary examples included Kayla [name] "scrambling together" conversations, movies, or other events. She also described trouble recalling information that she has recently read.  Decreased long-term memory: Endorsed. She described losing large "blocks" (several years) of time in her more distant past. She also reported forgetting about her daughter and future son-in-law moving into her home together for six months prior to their marriage. The latter example was particularly concerning for her as she described herself as quite adamant to the idea of individuals living together prior to marriage and her allowance of that would have been quite uncharacteristic  of her.  Decreased attention/concentration: Endorsed. She described significant trouble maintaining her focus and is very easily distracted. She is often unable to focus on a project through to completion. Her husband noted that punctuality is challenging at times due to Kayla [name] starting to tidy up the house immediately prior to their departure time. She stated that she gets distracted and sees things in her environment which need attention.  Reduced processing speed: Endorsed. Difficulties with executive functions: Endorsed. She  reported trouble with organization, multi-tasking, and decision making. She did not report trouble with impulsivity. She did describe developing an unspecified noise sensitivity condition during the past 2-3 years and that this can cause her to be more irritable and "snap" at others more easily.  Difficulties with emotion regulation: Denied outside what is mentioned above.  Difficulties with receptive language: Denied. Difficulties with word finding: Denied. Decreased visuoperceptual ability: Denied. Her husband noted that she has trouble with space. Specifically, she will feel strongly that a piece of furniture can fit in a particular location when this is not possible.   Trajectory of deficits: Short-term memory dysfunction was said to first become noticeable following a poorly executed gastric bypass procedure performed at [facility] in 1999. Deficits have persisted since that time and she was unclear if declines were attributed to a degenerative illness or the combination of various psychiatric and medical comorbidities.   Difficulties completing ADLs: Largely denied. However, she did describe needing to work harder to manage various charts and spreadsheets to keep track of income and outgoing bill payments. She described a poor sense of direction but stated that this was longstanding in nature. Her husband was in agreement. She can drive without reported issue.  However, these behaviors are infrequent due to numbness in her feet.   Additional Medical History: History of traumatic brain injury/concussion: She reported a past experience where she fell down a flight of stairs, "hitting my head on every step as I went down." She reported a positive loss in consciousness of unknown duration but less than 30 minutes. Afterwards, she described headache symptoms. She did not seek medical attention.  History of stroke: Denied. History of seizure activity: Denied. History of known exposure to toxins: Unclear. As described in Dr. Georgie Chard medical records back in 2018, she wondered about possible Agent Orange exposure. Her husband was a [place] veteran and she described purported cases of spouses exposed to [employer] through their husband's semen. Her neuropathic symptoms started shortly after his return. She also described spending time in [place], but in a non-combat or [employer] capacity.  Symptoms of chronic pain: Endorsed. Symptoms of peripheral neuropathy and fibromyalgia are quite extensive and severe. These were described as quite debilitating, impacting her mobility, ability to maintain restful sleep, and other biopsychosocial domains.  Experience of frequent headaches/migraines: Endorsed. She described experiencing daily headaches which she commonly attributed to stress. She reported a history of migraines. However, injections received via Dr. Tomi Likens have been very helpful at managing these symptoms. She reported rare migraine headaches currently.  Frequent instances of dizziness/vertigo: Denied.  Sensory changes: She wears glasses with benefit. She did report a history of both macular degeneration and glaucoma; however, symptoms were described as mild. Other sensory changes/difficulties (e.g., hearing, taste, or smell) were denied.  Balance/coordination difficulties: Endorsed. Balance instability was attributed to severe peripheral neuropathy. She commonly  ambulates with a wheelchair. She is able to walk short distances but often relies on external sources of support.  Other motor difficulties: Endorsed. Occasional tremors in her hands were attributed to peripheral neuropathy.   Sleep History: Estimated hours obtained each night: Unclear. She reported waking up "every hour on the hour all night long." This was attributed to chronic pain in her legs, as well as frequently waking to use the restroom.  Difficulties falling asleep: Denied. Difficulties staying asleep: Endorsed. Feels rested and refreshed upon awakening: Denied. She reported experiencing fatigue upon first awakening and will often take a mid-morning nap as she  and her husband commonly wake up around 5:00am.   History of snoring: Denied. History of waking up gasping for air: Denied. Witnessed breath cessation while asleep: Denied.  History of vivid dreaming: Endorsed. Excessive movement while asleep: Denied. Her husband did report that she may call out or make other vocalizations while asleep.  Instances of acting out her dreams: Denied.  Psychiatric/Behavioral Health History: Depression: Endorsed. She described a longstanding history of depressive symptoms, including periods where they have been quite severe. Fortunately, she described her current mood as "pretty good" and described her last significant depressive experience being several years in the past. Despite this, she openly acknowledged feeling "overwhelmed with bills and with life."  Anxiety: Endorsed. There also appears to be a somewhat longstanding history of generalized anxiety. More acutely, she described significant stress surrounding her husband's carious cardiovascular ailments, as well as her own personal ailments, especially those surrounding malnutrition and hypoglycemic episodes. She reported being told by one of her physicians that she would not be able to tolerate a surgical procedure in her current state. She also  worried about having a severe and life threatening hypoglycemic episode while asleep and not waking up.  Mania: Endorsed. Several years in the past, she reported having a "manic-depressive" episode during a "nervous breakdown." It was unclear if she sought inpatient psychiatric care for stabilization at that time. She did report that she spent "the next three years of my life in bed" with her husband caring for her. She noted that serving as primary caregiver for several family members who ultimately passed away served as the trigger for this experience.  Trauma History: Endorsed. Medical records suggest a history of PTSD. She described there being "a lot of garbage in our family that has been very traumatic." Examples included her father being imprisoned, several family members committing suicide, a poorly executed gastric bypass procedure in 1999 that created severe medical complications, and a myriad of other complex chronic medical ailments.  Visual/auditory hallucinations: Denied. Delusional thoughts: Denied.  Tobacco: Denied. Alcohol: She denied current alcohol consumption as well as a history of problematic alcohol abuse or dependence.  Recreational drugs: Denied.  Family History: Problem Relation Age of Onset   Stroke Mother    Lung cancer Mother    Heart disease Mother    Diabetes Mother    Hypertension Father    Stroke Father    Heart disease Father    Kidney disease Father    Seizures Father        epilepsy, and sisters x 2   Memory loss Father    Lung cancer Sister    Breast cancer Sister    Cancer - Lung Sister    Heart disease Sister    Diabetes Sister    Pancreatic cancer Sister    Healthy Daughter    Healthy Son    Colon cancer Maternal Aunt        12 relatives   Colon cancer Paternal Aunt    Colon cancer Paternal Aunt    Uterine cancer Other        aunt   Heart disease Other        grandmother   Esophageal cancer Neg Hx    Rectal cancer Neg Hx    Stomach  cancer Neg Hx    Colon polyps Neg Hx    This information was confirmed by Ms. Saffran.  Academic/Vocational History: Highest level of educational attainment: 15 years. She graduated from high school and completed three additional  years of college. She described herself as an average student in academic settings, with somewhat improved performances in college. Math, specifically algebra, was noted as a relative weakness.  History of developmental delay: Denied. History of grade repetition: Denied. Enrollment in special education courses: Denied. History of LD/ADHD: Denied.  Employment: She has received disability benefits due to her various medical ailments for many years. 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   Evaluation Results:   Behavioral Observations: Kayla [name] was accompanied by her husband, arrived to her appointment on time, and was appropriately dressed and groomed. She appeared alert and oriented. She was pushed in a wheelchair by her husband, making current gait and station unable to be assessed. Gross motor functioning appeared intact upon informal observation and no abnormal movements (e.g., tremors) were noted. Her affect was generally relaxed and positive, but did range appropriately given the subject being discussed during the clinical interview or the task at hand during testing procedures. Spontaneous speech was fluent and word finding difficulties were not observed during the clinical interview. Thought processes were coherent, organized, and normal in content. Insight into her cognitive difficulties appeared adequate. During testing, sustained attention was appropriate. Task engagement was adequate and she persisted when challenged. Overall, Kayla [name] was cooperative with the clinical interview and subsequent testing procedures.   Adequacy of Effort: The validity of neuropsychological testing is limited by the  extent to which the individual being tested may be assumed to have exerted adequate effort during testing. Kayla [name] expressed her intention to perform to the best of her abilities and exhibited adequate task engagement and persistence. Scores across stand-alone and embedded performance validity measures were within expectation. As such, the results of the current evaluation are believed to be a valid representation of Kayla [name]'s current cognitive functioning.  Test Results: Kayla [name] was fully oriented at the time of the current evaluation.  Intellectual abilities based upon educational and vocational attainment were estimated to be in the average range. Premorbid abilities were estimated to be within the average range based upon a single-word reading test.   Processing speed was average to above average. Basic attention was average. More complex attention (e.g., working memory) was above average. Executive functioning was largely above average to well above average.  While not directly assessed, receptive language abilities were believed to be intact. Likewise, Ms. Taormina did not exhibit any difficulties comprehending task instructions and answered all questions asked of her appropriately. Assessed expressive language (e.g., verbal fluency and confrontation naming) was below average to above average.     Assessed visuospatial/visuoconstructional abilities were average to well above average.    Learning (i.e., encoding) of novel verbal and visual information was average to above average. Spontaneous delayed recall (i.e., retrieval) of previously learned information was average. Retention rates were 100% across a story learning task, 100% across a list learning task, and 83% across a shape learning task. Performance across recognition tasks was average to above average, suggesting evidence for information consolidation.   Results of emotional screening instruments suggested that recent  symptoms of generalized anxiety were in the moderate range, while symptoms of depression were also within the moderate range. Across a more comprehensive personality questionnaire, she elevated clinical subscales surrounding somatic complaints and anxiety-related disorders. A screening instrument assessing recent sleep quality suggested the presence of moderate sleep dysfunction.  Tables of Scores:   Note: This summary of test scores accompanies the interpretive report and should  not be considered in isolation without reference to the appropriate sections in the text. Descriptors are based on appropriate normative data and may be adjusted based on clinical judgment. Terms such as "Within Normal Limits" and "Outside Normal Limits" are used when a more specific description of the test score cannot be determined.       Percentile - Normative Descriptor > 98 - Exceptionally High 91-97 - Well Above Average 75-90 - Above Average 25-74 - Average 9-24 - Below Average 2-8 - Well Below Average < 2 - Exceptionally Low       Validity:   DESCRIPTOR       Dot Counting Test: --- --- Within Normal Limits  NAB EVI: --- --- Within Normal Limits  D-KEFS Color Word Effort Index: --- --- Within Normal Limits       Orientation:      Raw Score Percentile   NAB Orientation, Form 1 29/29 --- ---       Cognitive Screening:      Raw Score Percentile   SLUMS: 22/30 --- ---       Intellectual Functioning:      Standard Score Percentile   Test of Premorbid Functioning: 102 55 Average       Memory:     NAB Memory Module, Form 1: Standard Score/ T Score Percentile   Total Memory Index 103 58 Average  List Learning       Total Trials 1-3 22/36 (49) 46 Average    Short Delay Free Recall 8/12 (54) 66 Average    Long Delay Free Recall 8/12 (55) 69 Average    Retention Percentage 100 (52) 58 Average    Recognition Discriminability 7 (51) 54 Average  Shape Learning       Total Trials 1-3 16/27 (54) 66 Average     Delayed Recall 5/9 (48) 42 Average    Retention Percentage 83 (45) 31 Average    Recognition Discriminability 7 (52) 58 Average  Story Learning       Immediate Recall 64/80 (52) 58 Average    Delayed Recall 34/40 (53) 62 Average    Retention Percentage 100 (53) 62 Average  Daily Living Memory       Immediate Recall 46/51 (59) 82 Above Average    Delayed Recall 13/17 (46) 34 Average    Retention Percentage 76 (44) 27 Average    Recognition Hits 10/10 (61) 86 Above Average       Attention/Executive Function:     Trail Making Test (TMT): Raw Score (T Score) Percentile     Part A 29 secs.,  0 errors (53) 62 Average    Part B 53 secs.,  0 errors (64) 92 Well Above Average         Scaled Score Percentile   WAIS-IV Coding: 15 95 Well Above Average       NAB Attention Module, Form 1: T Score Percentile     Digits Forward 48 42 Average    Digits Backwards 58 79 Above Average       D-KEFS Color-Word Interference Test: Raw Score (Scaled Score) Percentile     Color Naming 27 secs. (13) 84 Above Average    Word Reading 21 secs. (12) 75 Above Average    Inhibition 50 secs. (14) 34 Well Above Average      Total Errors 7 errors (6) 9 Below Average    Inhibition/Switching 41 secs. (15) 95 Well Above Average      Total Errors 0 errors (13)  84 Above Average       D-KEFS Verbal Fluency Test: Raw Score (Scaled Score) Percentile     Letter Total Correct 31 (9) 37 Average    Category Total Correct 44 (14) 91 Well Above Average    Category Switching Total Correct 15 (14) 91 Well Above Average    Category Switching Accuracy 13 (12) 75 Above Average      Total Set Loss Errors 1 (11) 63 Average      Total Repetition Errors 4 (9) 37 Average       Language:     Verbal Fluency Test: Raw Score (T Score) Percentile     Phonemic Fluency (FAS) 31 (42) 21 Below Average    Animal Fluency 22 (56) 73 Average        NAB Language Module, Form 1: T Score Percentile     Naming 31/31 (59) 82 Above  Average       Visuospatial/Visuoconstruction:      Raw Score Percentile   Clock Drawing: 10/10 --- Within Normal Limits       NAB Spatial Module, Form 1: T Score Percentile     Figure Drawing Copy 63 91 Well Above Average        Scaled Score Percentile   WAIS-IV Block Design: 8 25 Average  WAIS-IV Matrix Reasoning: 13 84 Above Average       Mood and Personality:      Raw Score Percentile   Beck Depression Inventory - II: 22 --- Moderate  PROMIS Anxiety Questionnaire: 25 --- Moderate       Personality Assessment Inventory: T Score  Percentile     Inconsistency 46 --- Within Normal Limits    Infrequency 44 --- Within Normal Limits    Negative Impression 66 --- Within Normal Limits    Positive Impression 52 --- Within Normal Limits    Somatic Complaints 90 --- Elevated    Anxiety 68 --- Within Normal Limits    Anxiety-Related Disorders 74 --- Elevated    Depression 68 --- Within Normal Limits    Mania 55 --- Within Normal Limits    Paranoia 40 --- Within Normal Limits    Schizophrenia 58 --- Within Normal Limits    Borderline Features 47 --- Within Normal Limits    Antisocial Features 41 --- Within Normal Limits    Alcohol Problems 47 --- Within Normal Limits    Drug Problems 54 --- Within Normal Limits    Aggression 42 --- Within Normal Limits    Suicidal Ideation 43 --- Within Normal Limits    Stress 46 --- Within Normal Limits    Non Support 42 --- Within Normal Limits    Treatment Rejection 42 --- Within Normal Limits    Dominance 58 --- Within Normal Limits    Warmth 65 --- Within Normal Limits       Additional Questionnaires:      Raw Score Percentile   PROMIS Sleep Disturbance Questionnaire: 37 --- Moderate   Informed Consent and Coding/Compliance:   The current evaluation represents a clinical evaluation for the purposes previously outlined by the referral source and is in no way reflective of a forensic evaluation.   Ms. Lofaro was provided with a verbal  description of the nature and purpose of the present neuropsychological evaluation. Also reviewed were the foreseeable risks and/or discomforts and benefits of the procedure, limits of confidentiality, and mandatory reporting requirements of this provider. The patient was given the opportunity to ask questions and receive answers  about the evaluation. Oral consent to participate was provided by the patient.   This evaluation was conducted by Christia Reading, Ph.D., ABPP-CN, board certified clinical neuropsychologist. Ms. Mickelson completed a clinical interview with Dr. Melvyn Novas, billed as one unit [phone], and 140 minutes of cognitive testing and scoring, billed as one unit [phone] and four additional units [id number]. Psychometrist Cruzita Lederer, B.S., assisted Dr. Melvyn Novas with test administration and scoring procedures. As a separate and discrete service, Dr. Melvyn Novas spent a total of 160 minutes in interpretation and report writing billed as one unit [phone] and two units [id number].

## 2021-03-26 DIAGNOSIS — M797 Fibromyalgia: Secondary | ICD-10-CM | POA: Diagnosis not present

## 2021-03-26 DIAGNOSIS — G629 Polyneuropathy, unspecified: Secondary | ICD-10-CM | POA: Diagnosis not present

## 2021-03-26 DIAGNOSIS — Z9884 Bariatric surgery status: Secondary | ICD-10-CM | POA: Diagnosis not present

## 2021-03-26 DIAGNOSIS — D649 Anemia, unspecified: Secondary | ICD-10-CM | POA: Diagnosis not present

## 2021-03-26 DIAGNOSIS — E559 Vitamin D deficiency, unspecified: Secondary | ICD-10-CM | POA: Diagnosis not present

## 2021-03-26 DIAGNOSIS — M1612 Unilateral primary osteoarthritis, left hip: Secondary | ICD-10-CM | POA: Diagnosis not present

## 2021-03-26 DIAGNOSIS — E1142 Type 2 diabetes mellitus with diabetic polyneuropathy: Secondary | ICD-10-CM | POA: Diagnosis not present

## 2021-03-26 DIAGNOSIS — N186 End stage renal disease: Secondary | ICD-10-CM | POA: Diagnosis not present

## 2021-03-26 DIAGNOSIS — E1165 Type 2 diabetes mellitus with hyperglycemia: Secondary | ICD-10-CM | POA: Diagnosis not present

## 2021-03-26 DIAGNOSIS — I1311 Hypertensive heart and chronic kidney disease without heart failure, with stage 5 chronic kidney disease, or end stage renal disease: Secondary | ICD-10-CM | POA: Diagnosis not present

## 2021-03-26 DIAGNOSIS — N390 Urinary tract infection, site not specified: Secondary | ICD-10-CM | POA: Diagnosis not present

## 2021-03-26 DIAGNOSIS — J449 Chronic obstructive pulmonary disease, unspecified: Secondary | ICD-10-CM | POA: Diagnosis not present

## 2021-03-27 DIAGNOSIS — Z20822 Contact with and (suspected) exposure to covid-19: Secondary | ICD-10-CM | POA: Diagnosis not present

## 2021-03-29 ENCOUNTER — Other Ambulatory Visit: Payer: Self-pay

## 2021-03-29 ENCOUNTER — Ambulatory Visit (INDEPENDENT_AMBULATORY_CARE_PROVIDER_SITE_OTHER): Payer: Medicare Other | Admitting: Psychology

## 2021-03-29 DIAGNOSIS — R4189 Other symptoms and signs involving cognitive functions and awareness: Secondary | ICD-10-CM | POA: Diagnosis not present

## 2021-03-29 DIAGNOSIS — F331 Major depressive disorder, recurrent, moderate: Secondary | ICD-10-CM | POA: Diagnosis not present

## 2021-03-29 DIAGNOSIS — F411 Generalized anxiety disorder: Secondary | ICD-10-CM | POA: Diagnosis not present

## 2021-03-29 DIAGNOSIS — F4310 Post-traumatic stress disorder, unspecified: Secondary | ICD-10-CM | POA: Diagnosis not present

## 2021-03-29 DIAGNOSIS — M797 Fibromyalgia: Secondary | ICD-10-CM | POA: Diagnosis not present

## 2021-03-29 NOTE — Progress Notes (Signed)
? ?  Neuropsychology Feedback Session ?[facility] ?[place] Department of Neurology ? ?Reason for Referral:  ? ?Kayla [name] is a 76 y.o. right-handed Caucasian female referred by Metta Clines, D.O., to characterize her current cognitive functioning and assist with diagnostic clarity and treatment planning in the context of subjective cognitive decline and various psychiatric comorbidities.  ? ?Feedback:  ? ?Kayla [name] completed a comprehensive neuropsychological evaluation on 03/23/2021. Please refer to that encounter for the full report and recommendations. Briefly, results suggested neuropsychological functioning within normal limits. No degree of weakness/impairment was exhibited across testing. Performances were consistently appropriate across all assessed cognitive domains. Across mood-related questionnaires, Kayla [name] reported acute symptoms of moderate and anxiety and moderate depression occurring during the past 1-2 weeks. Across a more comprehensive personality questionnaire, she elevated clinical subscales surrounding somatic complains and anxiety-related disorders. She also had near-elevations across anxiety and depression subscales. Overall, her report of subjective cognitive dysfunction is most likely related to ongoing psychiatric distress (i.e., anxiety, depression, PTSD), a strong mental preoccupation with somatic symptoms (i.e., frequent headaches, neuropathy, fibromyalgia), and ongoing sleep dysfunction.  ? ?Kayla [name] was accompanied by her husband during the current feedback session. Content of the current session focused on the results of her neuropsychological evaluation. Kayla [name] was given the opportunity to ask questions and her questions were answered. She was encouraged to reach out should additional questions arise. A copy of her report was provided at the conclusion of the visit.  ? ?  ? ?30 minutes were spent conducting the current feedback session with  Kayla [name], billed as one unit [id number].  ?

## 2021-03-30 DIAGNOSIS — E610 Copper deficiency: Secondary | ICD-10-CM | POA: Diagnosis not present

## 2021-03-30 DIAGNOSIS — R634 Abnormal weight loss: Secondary | ICD-10-CM | POA: Diagnosis not present

## 2021-03-30 DIAGNOSIS — E161 Other hypoglycemia: Secondary | ICD-10-CM | POA: Diagnosis not present

## 2021-03-30 DIAGNOSIS — D519 Vitamin B12 deficiency anemia, unspecified: Secondary | ICD-10-CM | POA: Diagnosis not present

## 2021-03-30 DIAGNOSIS — D509 Iron deficiency anemia, unspecified: Secondary | ICD-10-CM | POA: Diagnosis not present

## 2021-03-30 DIAGNOSIS — D649 Anemia, unspecified: Secondary | ICD-10-CM | POA: Diagnosis not present

## 2021-03-30 DIAGNOSIS — E559 Vitamin D deficiency, unspecified: Secondary | ICD-10-CM | POA: Diagnosis not present

## 2021-04-02 NOTE — Progress Notes (Signed)
? ?NEUROLOGY FOLLOW UP OFFICE NOTE ? ?[place] ?[id number] ? ?Assessment/Plan:  ? ?Migraine without aura, without status migrainosus, not intractable ?Idiopathic polyneuropathy ?Chronic pain syndrome ?Subjective memory complaints ?  ?To help with increased pain, will increase Lyrica to '100mg'$  twice daily.  Continue Cymbalta '30mg'$  twice daily.  Advised not to use the lidocaine patches more than recommended. ?Migraine prevention:  Emgality ?Migraine rescue:  sumatriptan ?She has a therapist.  Advised to establish care with a psychiatrist to address her underlying PTSD and insomnia. ?Limit use of pain relievers to no more than 2 days out of week to prevent risk of rebound or medication-overuse headache. ?Keep headache diary ?Follow up 6 months ?  ?  ?Subjective:  ?Kayla [name] is a 76 year old woman with fibromyalgia, type 2 diabetes, anxiety with history of PTSD, degenerative disc disease of cervical and lumbar spine, and history of nephrolithiasis and gastric bypass who follows up for polyneuropathy, migraine and memory complaints.  She is accompanied by her husband. ?  ?UPDATE: ?Migraine: ?No severe migraines.  Has a mild to moderate daily headache ?  ?Neuropathy/Chronic pain ?Muscle pain and spasms in ankles have returned.  Increased Lyrica.  Feels neuropathic pain is a little worse.  Chronic neck pain.  Declines epidural injections as previously ineffective and caused side effects.   ? ?Memory Complaints ?She mentioned trouble with short term memory.  Neuropsychological evaluation on 03/23/2021 was within normal limits and likely he memory complaints are related to PTSD, chronic pain and poor sleep.   ?  ?Current NSAIDS:  flurbiprofen; Celebrex ?Current analgesics:  Excedrin Migraine, Lidoderm, voltaren 1%, Fentanyl patch ?Current triptans:  Sumatriptan '100mg'$  ?Current ergotamine:  no ?Current anti-emetic:  Zofran '8mg'$  ?Current muscle relaxants:  none ?Current anti-anxiolytic:  clonazepam ?Current sleep aide:   trazodone ?Current Antihypertensive medications:  Lasix ?Current Antidepressant medications:  Cymbalta '30mg'$  twice daily,  trazodone '25mg'$  infrequently for sleep ?Current Anticonvulsant medications:  Lyrica '50mg'$  in AM and '100mg'$  at night, ?Current anti-CGRP:  Emgality ?Current Vitamins/Herbal/Supplements:  turmeric ?Current Antihistamines/Decongestants:  no ?Other therapy:  Physical therapy, [facility] ?  ?Caffeine:  2 cups of coffee daily ?Alcohol:  no ?Smoker:  no ?Diet:  poor ?Exercise:  no ?Depression:  no; Anxiety:  yes ?Sleep hygiene:  poor ?Chronic pain syndrome:  Followed by pain management.  I have her on Lyrica.  She is prescribed Cymbalta by another provider. ?  ?HISTORY: ?I  Neuropathy:  Since July 2018, she has had increased swelling and burning numbness and tingling in the legs.  She had lower extremity vascular ABIs on 08/20/16, which was negative.  She has history of B12 deficiency but she takes injections and recent B12 level from 09/21/16 was over 2000.  Methylmalonic acid level was 209, RPR nonreactive, homocysteine 7.8.  Labs from 07/12/16 include normal SPEP, Sed Rate 2, CRP less than 0.3, and TSH 2.430 .  Vitamin D was 23 and she was advised to start supplementation.  Serum glucose has been 70s up to 110.  She also takes B6.  She also has history of weight loss.  She has fibromyalgia and was previously diagnosed with neuropathy by another neurologist.  She has a spinal cord stimulator.  She has been on gabapentin for many years.  It was briefly discontinued to see if it was contributing to the swelling.  She reported no significant change, so it was restarted (she takes '300mg'$  4 times daily), although she thinks it helped a little bit.  NCV-EMG from 10/21/16 demonstrated  chronic sensorimotor polyneuropathy of the predominantly axonal type as well as mild left median neuropathy at or distal to the wrist.  Other labs include B1 146.1, B6 10.7, Sed Rate 2, HIV negative, TSH 1.780, UPEP negative.  [facility] to  [facility] of neuropathy.  Selenium, Zinc, paraneoplastic panel and genetic testing Copper was low, so she was advised to start supplement.  B6 was still elevated despite stopping supplements 3 prior.   ?  ?She was wondering if her neuropathy could be secondary to [employer] exposure.  Her husband was a [place] veteran and reports cases of spouses exposed to [employer] through their husband's semen.  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  Her neuropathic symptoms started shortly after her return.   ?  ?  ?II Migraine: ?Onset:  Since 1970, after her twin newborns passed away.  She has lived with life-long family-related stress ?Location:  Migraines are unilateral/parietal or bilateral retro-orbital, chronic tension type (bifrontal) ?Quality:  Severe crushing ?Initial Intensity:  10/10 ?Aura:  no ?Prodrome:  no ?Associated symptoms:  Photophobia, phonophobia.  No nausea, vomiting or visual disturbance ?Initial Duration:  Migraines 1 hour with sumatriptan, other headaches 15 minutes with Fioricet ?Initial Frequency:  Daily (3 to 4 days a month are severe migraine) ?Triggers/exacerbating factors:  Stress, Nexium ?Relieving factors:  Clonazepam, Fioricet, sumatriptan ?Activity:  Cannot function when severe (3-4 days per month) ?  ?Past NSAIDS:  Ibuprofen, naproxen ?Past analgesics:  Cafergot, Excedrin, Tylenol, Tramadol, Fioricet ?Past abortive triptans:  no ?Past muscle relaxants:  tizanidine, Flexeril ?Past anti-emetic:  Zofran ODT '8mg'$  ?Past antihypertensive medications:  Lopressor, propranolol ?Past antidepressant medications:  Elavil, Effexor, Zoloft ?Past anticonvulsant medications:  Topiramate, gabapentin '300mg'$  twice daily ?Past CGRP-inhibitor:  Aimovig '140mg'$  ?Past vitamins/Herbal/Supplements:  CoQ10 ?Other past therapies:  yoga ?  ?Family history of headache:  Father, sister, brother ?  ?MRI and MRA of brain from 07/23/11 were personally reviewed and unremarkable. ? ?PAST  MEDICAL HISTORY: ?Past Medical History:  ?Diagnosis Date  ? Adjustment disorder with depressed mood 03/18/2007  ? Allergy   ? Anal fissure 10/08/2016  ? Angina   ? takes Propanolol prn and it stops her chest  ? Anorexia nervosa 11/11/2019  ? Barrett esophagus   ? not consistently present  ? Cataract   ? removed  but had retinal detachment - repeat cataract right eye  ? Cervical facet joint syndrome 10/08/2016  ? Chronic constipation 10/04/2010  ? Chronic interstitial cystitis 10/16/2011  ? Chronic kidney disease   ? kidney stones and UTI  ? Chronic maxillary sinusitis 09/15/2013  ? Chronic narcotic dependence 10/03/2018  ? Complication of anesthesia   ? either  BP or pulse dropped with last surgery  ? Degeneration of cervical intervertebral disc 03/18/2007  ? Degeneration of lumbar or lumbosacral intervertebral disc 03/18/2007  ? Diaphragmatic hernia 03/18/2007  ? Dysrhythmia   ? brought on by stress  ? Epigastric pain 11/11/2019  ? Esophageal dysphagia 10/03/2018  ? External hemorrhoids   ? Fibromyalgia   ? neuropathy knees ankles and toes, bladder  ? Generalized anxiety disorder 03/18/2007  ? GERD (gastroesophageal reflux disease) 03/18/2007  ? HA (headache)-chronic/tension type 07/28/2012  ? Heart murmur   ? Hiatal hernia   ? History of anemia   ? History of gastric bypass 02/11/2014  ? [facility] at [facility]  ? History of hyperparathyroidism 01/22/2016  ? History of kidney stones   ? Hypertension   ? Hypoglycemia 11/08/2016  ?  IBS (irritable bowel syndrome)   ? Internal hemorrhoids   ? LLQ abdominal pain 11/11/2019  ? Macular degeneration   ? Malnutrition 11/11/2019  ? Mechanical breakdown implanted electronic neurostimulator spinal cord 11/16/2018  ? Mitral regurgitation 12/11/2015  ? MVP (mitral valve prolapse) 12/17/2017  ? Myalgia 03/18/2007  ? Nephrolithiasis 07/28/2012  ? lithotrip 2013-[place],MD  ? Neuropathic pain 01/23/2018  ? Orthostatic hypotension 11/20/2016  ? Osteoarthritis   ? all over  ?  Osteopetrosis   ? Osteoporosis 07/28/2012  ? Peripheral vascular disease   ? superficial phlebitis in left calf  ? Personal history of colonic polyps 07/22/2013  ? 07/2013 - 3 small adenomas - repeat colonoscopy 20

## 2021-04-03 ENCOUNTER — Other Ambulatory Visit: Payer: Self-pay

## 2021-04-03 ENCOUNTER — Encounter: Payer: Self-pay | Admitting: Neurology

## 2021-04-03 ENCOUNTER — Encounter: Payer: Medicare Other | Admitting: Psychology

## 2021-04-03 ENCOUNTER — Ambulatory Visit (INDEPENDENT_AMBULATORY_CARE_PROVIDER_SITE_OTHER): Payer: Medicare Other | Admitting: Neurology

## 2021-04-03 VITALS — BP 103/66 | HR 95 | Ht 65.0 in | Wt 96.0 lb

## 2021-04-03 DIAGNOSIS — F4310 Post-traumatic stress disorder, unspecified: Secondary | ICD-10-CM

## 2021-04-03 DIAGNOSIS — E875 Hyperkalemia: Secondary | ICD-10-CM | POA: Diagnosis not present

## 2021-04-03 DIAGNOSIS — J449 Chronic obstructive pulmonary disease, unspecified: Secondary | ICD-10-CM | POA: Diagnosis not present

## 2021-04-03 DIAGNOSIS — R4189 Other symptoms and signs involving cognitive functions and awareness: Secondary | ICD-10-CM

## 2021-04-03 DIAGNOSIS — E1142 Type 2 diabetes mellitus with diabetic polyneuropathy: Secondary | ICD-10-CM | POA: Diagnosis not present

## 2021-04-03 DIAGNOSIS — G629 Polyneuropathy, unspecified: Secondary | ICD-10-CM | POA: Insufficient documentation

## 2021-04-03 DIAGNOSIS — N186 End stage renal disease: Secondary | ICD-10-CM | POA: Diagnosis not present

## 2021-04-03 DIAGNOSIS — I1311 Hypertensive heart and chronic kidney disease without heart failure, with stage 5 chronic kidney disease, or end stage renal disease: Secondary | ICD-10-CM | POA: Diagnosis not present

## 2021-04-03 DIAGNOSIS — M1612 Unilateral primary osteoarthritis, left hip: Secondary | ICD-10-CM | POA: Diagnosis not present

## 2021-04-03 DIAGNOSIS — G6289 Other specified polyneuropathies: Secondary | ICD-10-CM

## 2021-04-03 DIAGNOSIS — G43009 Migraine without aura, not intractable, without status migrainosus: Secondary | ICD-10-CM | POA: Diagnosis not present

## 2021-04-03 DIAGNOSIS — M797 Fibromyalgia: Secondary | ICD-10-CM | POA: Diagnosis not present

## 2021-04-03 DIAGNOSIS — M5136 Other intervertebral disc degeneration, lumbar region: Secondary | ICD-10-CM | POA: Diagnosis not present

## 2021-04-03 DIAGNOSIS — F32A Depression, unspecified: Secondary | ICD-10-CM | POA: Diagnosis not present

## 2021-04-03 DIAGNOSIS — E1165 Type 2 diabetes mellitus with hyperglycemia: Secondary | ICD-10-CM | POA: Diagnosis not present

## 2021-04-03 DIAGNOSIS — M180 Bilateral primary osteoarthritis of first carpometacarpal joints: Secondary | ICD-10-CM | POA: Diagnosis not present

## 2021-04-03 DIAGNOSIS — M5030 Other cervical disc degeneration, unspecified cervical region: Secondary | ICD-10-CM | POA: Diagnosis not present

## 2021-04-03 MED ORDER — PREGABALIN 50 MG PO CAPS: 100.0000 mg | ORAL_CAPSULE | Freq: Two times a day (BID) | ORAL | 5 refills | Status: DC

## 2021-04-03 NOTE — Patient Instructions (Addendum)
Increase pregablin to '100mg'$  twice daily ?Emgality ?Sumatriptan as needed.  Limit use of pain relievers to no more than 2 days out of week to prevent risk of rebound or medication-overuse headache. ?Keep headache diary ?Follow up 6 months. ?

## 2021-04-04 ENCOUNTER — Telehealth: Payer: Self-pay | Admitting: Hematology and Oncology

## 2021-04-04 NOTE — Telephone Encounter (Signed)
Scheduled appt per 3/22 referral. Pt is aware of appt date and time. Pt is aware to arrive 15 mins prior to appt time and to bring and updated insurance card. Pt is aware of appt location.   ?

## 2021-04-06 DIAGNOSIS — M47812 Spondylosis without myelopathy or radiculopathy, cervical region: Secondary | ICD-10-CM | POA: Diagnosis not present

## 2021-04-06 DIAGNOSIS — G894 Chronic pain syndrome: Secondary | ICD-10-CM | POA: Diagnosis not present

## 2021-04-06 DIAGNOSIS — M792 Neuralgia and neuritis, unspecified: Secondary | ICD-10-CM | POA: Diagnosis not present

## 2021-04-11 DIAGNOSIS — Z20822 Contact with and (suspected) exposure to covid-19: Secondary | ICD-10-CM | POA: Diagnosis not present

## 2021-04-13 ENCOUNTER — Encounter: Payer: Self-pay | Admitting: Physical Medicine & Rehabilitation

## 2021-04-13 ENCOUNTER — Encounter: Payer: Medicare Other | Attending: Physical Medicine & Rehabilitation | Admitting: Physical Medicine & Rehabilitation

## 2021-04-13 VITALS — BP 111/73 | HR 102 | Ht 65.0 in | Wt 106.0 lb

## 2021-04-13 DIAGNOSIS — M5137 Other intervertebral disc degeneration, lumbosacral region: Secondary | ICD-10-CM | POA: Diagnosis not present

## 2021-04-13 DIAGNOSIS — M5030 Other cervical disc degeneration, unspecified cervical region: Secondary | ICD-10-CM | POA: Diagnosis not present

## 2021-04-13 NOTE — Progress Notes (Addendum)
? ?Subjective:  ? ? Patient ID: Kayla [name], female    DOB: 06/09/1945, 76 y.o.   MRN: [id number] ? ?HPI ?Chief complaint: Widespread body pain. ?76 year old female referred by her new primary care physician with a diagnosis of chronic pain syndrome.  She has a prior medical history of cervical and lumbar degenerative disc disease.  She has seen Dr. Rennis Harding [facility] as well as Dr. Posey Pronto who is a pain management specialist in [facility] and who has performed cervical facet injections as well as did a trial spinal cord stimulator that was eventually implanted by Dr. Patrice Paradise.  The patient has been diagnosed with fibromyalgia by her rheumatologist Dr. [name]. ?The patient indicates on her pain diagram that she has headache pain bilateral shoulder pain, diet bilateral elbow pain, bilateral wrist pain, bilateral hand pain, bilateral hip pain, bilateral knee pain, bilateral leg pain, bilateral foot pain which is described as sharp burning dull stabbing tingling aching.  The patient states she has a history of neuropathy and has been seeing Dr. Tomi Likens for this problem.  Prior to that had been evaluated at [facility] Department of neurology. ?The patient is currently taking Duragesic patch 25 mcg every 72 hours.  She has not tried coming off this medication but states she may want to in the future.  In the past she has been on Duragesic 100 mcg patch but was weaned down.  She is also taking hydrocodone/325 1/2 tablet p.o. as needed.  She is taking Klonopin 0.5 mg on a daily basis.  She is on Celebrex and has used Lidoderm patches as well as Voltaren cream.  For the pain she is taking Lyrica 50 mg in the morning and 100 mg at night ? ?The patient has history of morbid obesity but has been under nourished status post gastric bypass surgery ? ?The patient has seen Dr. Vilinda Flake in the past in 2013, 2014 and 2015 ? ?Functionally the patient is at a modified independent level but  needs some assistance with meal prep household duties and shopping.  She does not do any regular exercise ?Pain Inventory ?Average Pain 8 ?Pain Right Now 8 ?My pain is sharp, burning, stabbing, tingling, and aching ? ?In the last 24 hours, has pain interfered with the following? ?General activity 4 ?Relation with others 10 ?Enjoyment of life 7 ?What TIME of day is your pain at its worst? morning , daytime, evening, and night ?Sleep (in general) Poor ? ?Pain is worse with: walking, bending, sitting, standing, and some activites ?Pain improves with: rest, medication, and TENS ?Relief from Meds: 6 ? ?walk with assistance ?use a cane ?use a walker ?how many minutes can you walk? 1-2 ?ability to climb steps?  no ?do you drive?  yes ?use a wheelchair ?transfers alone ? ?disabled: date disabled 01/18/2005 ?I need assistance with the following:  meal prep, household duties, and shopping ? ?bladder control problems ?weakness ?numbness ?tremor ?tingling ?trouble walking ?spasms ?dizziness ? ?New pt ? ?New pt ? ? ? ?Family History  ?Problem Relation Age of Onset  ? Stroke Mother   ? Lung cancer Mother   ? Heart disease Mother   ? Diabetes Mother   ? Hypertension Father   ? Stroke Father   ? Heart disease Father   ? Kidney disease Father   ? Seizures Father   ?     epilepsy, and sisters x 2  ? Memory loss Father   ? Lung cancer Sister   ?  Breast cancer Sister   ? Cancer - Lung Sister   ? Heart disease Sister   ? Diabetes Sister   ? Pancreatic cancer Sister   ? Healthy Daughter   ? Healthy Son   ? Colon cancer Maternal Aunt   ?     12 relatives  ? Colon cancer Paternal Aunt   ? Colon cancer Paternal Aunt   ? Uterine cancer Other   ?     aunt  ? Heart disease Other   ?     grandmother  ? Esophageal cancer Neg Hx   ? Rectal cancer Neg Hx   ? Stomach cancer Neg Hx   ? Colon polyps Neg Hx   ? ?Social History  ? ?Socioeconomic History  ? Marital status: Married  ?  Spouse name: Rushie Chestnut.  ? Number of children: 2  ? Years of education:  6  ? Highest education level: Some college, no degree  ?Occupational History  ? Occupation: Disability  ?  Employer: RETIRED  ?Tobacco Use  ? Smoking status: Never  ?  Passive exposure: Past  ? Smokeless tobacco: Never  ?Vaping Use  ? Vaping Use: Never used  ?Substance and Sexual Activity  ? Alcohol use: No  ? Drug use: No  ? Sexual activity: Not Currently  ?Other Topics Concern  ? Not on file  ?Social History Narrative  ? Right handed  ? Two story home  ? She retired on disability  ? Married  ? 2 Children  ? ?Social Determinants of Health  ? ?Financial Resource Strain: Not on file  ?Food Insecurity: Not on file  ?Transportation Needs: Not on file  ?Physical Activity: Not on file  ?Stress: Not on file  ?Social Connections: Not on file  ? ?Past Surgical History:  ?Procedure Laterality Date  ? ABDOMINAL HYSTERECTOMY    ? APPENDECTOMY    ? CARDIAC CATHETERIZATION  03/22/1991  ? EF 61%  ? CARDIOVASCULAR STRESS TEST  10/03/2006  ? CHOLECYSTECTOMY    ? COLONOSCOPY  06/23/2008  ? normal  ? DILATION AND CURETTAGE OF UTERUS    ? x2  ? ESOPHAGUS SURGERY    ? EXPLORATORY LAPAROTOMY  1993  ? EYE SURGERY    ? GASTRIC BYPASS  1999  ? GASTRIC RESTRICTION SURGERY  1991  ? LASIK Bilateral   ? Right Arm Surgery    ? Right Knee Arthroscopy    ? Rt wrist fx  2009  ? spinal surgery battery implant    ? stomach stappeling  1991  ? STONE EXTRACTION WITH BASKET  2012  ? TONSILLECTOMY    ? TUBAL LIGATION    ? UPPER GASTROINTESTINAL ENDOSCOPY  06/23/2008  ? w/Dil, Barrett's esophagus  ? US ECHOCARDIOGRAPHY  01/21/2007  ? EF 55-60%  ? US ECHOCARDIOGRAPHY  08/30/2003  ? EF 55-60%  ? ?Past Medical History:  ?Diagnosis Date  ? Adjustment disorder with depressed mood 03/18/2007  ? Allergy   ? Anal fissure 10/08/2016  ? Angina   ? takes Propanolol prn and it stops her chest  ? Anorexia nervosa 11/11/2019  ? Barrett esophagus   ? not consistently present  ? Cataract   ? removed  but had retinal detachment - repeat cataract right eye  ? Cervical facet  joint syndrome 10/08/2016  ? Chronic constipation 10/04/2010  ? Chronic interstitial cystitis 10/16/2011  ? Chronic kidney disease   ? kidney stones and UTI  ? Chronic maxillary sinusitis 09/15/2013  ? Chronic narcotic dependence 10/03/2018  ?  Complication of anesthesia   ? either  BP or pulse dropped with last surgery  ? Degeneration of cervical intervertebral disc 03/18/2007  ? Degeneration of lumbar or lumbosacral intervertebral disc 03/18/2007  ? Diaphragmatic hernia 03/18/2007  ? Dysrhythmia   ? brought on by stress  ? Epigastric pain 11/11/2019  ? Esophageal dysphagia 10/03/2018  ? External hemorrhoids   ? Fibromyalgia   ? neuropathy knees ankles and toes, bladder  ? Generalized anxiety disorder 03/18/2007  ? GERD (gastroesophageal reflux disease) 03/18/2007  ? HA (headache)-chronic/tension type 07/28/2012  ? Heart murmur   ? Hiatal hernia   ? History of anemia   ? History of gastric bypass 02/11/2014  ? 1999 at [facility]  ? History of hyperparathyroidism 01/22/2016  ? History of kidney stones   ? Hypertension   ? Hypoglycemia 11/08/2016  ? IBS (irritable bowel syndrome)   ? Internal hemorrhoids   ? LLQ abdominal pain 11/11/2019  ? Macular degeneration   ? Malnutrition 11/11/2019  ? Mechanical breakdown implanted electronic neurostimulator spinal cord 11/16/2018  ? Mitral regurgitation 12/11/2015  ? MVP (mitral valve prolapse) 12/17/2017  ? Myalgia 03/18/2007  ? Nephrolithiasis 07/28/2012  ? lithotrip 2013-Eskridge,MD  ? Neuropathic pain 01/23/2018  ? Orthostatic hypotension 11/20/2016  ? Osteoarthritis   ? all over  ? Osteopetrosis   ? Osteoporosis 07/28/2012  ? Peripheral vascular disease   ? superficial phlebitis in left calf  ? Personal history of colonic polyps 07/22/2013  ? 07/2013 - 3 small adenomas - repeat colonoscopy 2018  ? PTSD (post-traumatic stress disorder)   ? Rosacea 11/02/2017  ? Small intestinal bacterial overgrowth 08/05/2017  ? + lactulose breath test 06/2017 Xifaxan Rx  ? Type II diabetes  mellitus 11/18/2016  ? ?BP 111/73   Pulse (!) 102   Ht '5\' 5"'$  (1.651 m)   Wt 106 lb (48.1 kg)   SpO2 97%   BMI 17.64 kg/m?  ? ?Opioid Risk Score:   ?Fall Risk Score:  `1 ? ?Depression screen PHQ 2/9 ? ? ?

## 2021-04-18 ENCOUNTER — Inpatient Hospital Stay: Payer: Medicare Other

## 2021-04-18 ENCOUNTER — Telehealth: Payer: Self-pay | Admitting: Pharmacy Technician

## 2021-04-18 ENCOUNTER — Inpatient Hospital Stay: Payer: Medicare Other | Attending: Hematology and Oncology | Admitting: Hematology and Oncology

## 2021-04-18 ENCOUNTER — Other Ambulatory Visit: Payer: Self-pay

## 2021-04-18 DIAGNOSIS — D508 Other iron deficiency anemias: Secondary | ICD-10-CM

## 2021-04-18 DIAGNOSIS — D509 Iron deficiency anemia, unspecified: Secondary | ICD-10-CM | POA: Insufficient documentation

## 2021-04-18 LAB — RETIC PANEL
Immature Retic Fract: 22.8 % — ABNORMAL HIGH (ref 2.3–15.9)
RBC.: 3.7 MIL/uL — ABNORMAL LOW (ref 3.87–5.11)
Retic Count, Absolute: 89.5 10*3/uL (ref 19.0–186.0)
Retic Ct Pct: 2.4 % (ref 0.4–3.1)
Reticulocyte Hemoglobin: 29 pg (ref >27.9)

## 2021-04-18 LAB — CMP (CANCER CENTER ONLY)
ALT: 19 U/L (ref 0–44)
AST: 27 U/L (ref 15–41)
Albumin: 3.5 g/dL (ref 3.5–5.0)
Alkaline Phosphatase: 49 U/L (ref 38–126)
Anion gap: 2 — ABNORMAL LOW (ref 5–15)
BUN: 28 mg/dL — ABNORMAL HIGH (ref 8–23)
CO2: 31 mmol/L (ref 22–32)
Calcium: 8.7 mg/dL — ABNORMAL LOW (ref 8.9–10.3)
Chloride: 108 mmol/L (ref 98–111)
Creatinine: 0.66 mg/dL (ref 0.44–1.00)
GFR, Estimated: >60 mL/min (ref >60)
Glucose, Bld: 98 mg/dL (ref 70–99)
Potassium: 4.6 mmol/L (ref 3.5–5.1)
Sodium: 141 mmol/L (ref 135–145)
Total Bilirubin: 0.3 mg/dL (ref 0.3–1.2)
Total Protein: 5.7 g/dL — ABNORMAL LOW (ref 6.5–8.1)

## 2021-04-18 LAB — CBC WITH DIFFERENTIAL (CANCER CENTER ONLY)
Abs Immature Granulocytes: 0.02 10*3/uL (ref 0.00–0.07)
Basophils Absolute: 0 10*3/uL (ref 0.0–0.1)
Basophils Relative: 1 %
Eosinophils Absolute: 0.6 10*3/uL — ABNORMAL HIGH (ref 0.0–0.5)
Eosinophils Relative: 10 %
HCT: 31.9 % — ABNORMAL LOW (ref 36.0–46.0)
Hemoglobin: 10.1 g/dL — ABNORMAL LOW (ref 12.0–15.0)
Immature Granulocytes: 0 %
Lymphocytes Relative: 37 %
Lymphs Abs: 2.3 10*3/uL (ref 0.7–4.0)
MCH: 27.7 pg (ref 26.0–34.0)
MCHC: 31.7 g/dL (ref 30.0–36.0)
MCV: 87.6 fL (ref 80.0–100.0)
Monocytes Absolute: 0.5 10*3/uL (ref 0.1–1.0)
Monocytes Relative: 8 %
Neutro Abs: 2.9 10*3/uL (ref 1.7–7.7)
Neutrophils Relative %: 44 %
Platelet Count: 279 10*3/uL (ref 150–400)
RBC: 3.64 MIL/uL — ABNORMAL LOW (ref 3.87–5.11)
RDW: 14.3 % (ref 11.5–15.5)
WBC Count: 6.3 10*3/uL (ref 4.0–10.5)
nRBC: 0 % (ref 0.0–0.2)

## 2021-04-18 LAB — FOLATE: Folate: 44.6 ng/mL (ref >5.9)

## 2021-04-18 LAB — FERRITIN: Ferritin: 5 ng/mL — ABNORMAL LOW (ref 11–307)

## 2021-04-18 LAB — IRON AND IRON BINDING CAPACITY (CC-WL,HP ONLY)
Iron: 38 ug/dL (ref 28–170)
Saturation Ratios: 9 % — ABNORMAL LOW (ref 10.4–31.8)
TIBC: 416 ug/dL (ref 250–450)
UIBC: 378 ug/dL (ref 148–442)

## 2021-04-18 LAB — VITAMIN B12: Vitamin B-12: 704 pg/mL (ref 180–914)

## 2021-04-18 NOTE — Progress Notes (Signed)
?[facility] ?[phone]   [phone] ? ?INITIAL CONSULT NOTE ? ?Patient Care Team: ?[name], MD as PCP - General (Family Medicine) ?Nahser, Wonda Cheng, MD as PCP - Cardiology (Cardiology) ?Rainwater, Willey Blade, PA-C as Physician Assistant (Physician Assistant) ?Stoneking, Reece Leader., MD as Referring Physician (Urology) ?Calvert Cantor, MD as Consulting Physician (Ophthalmology) ?Gatha Mayer, MD as Consulting Physician (Gastroenterology) ?Pieter Partridge, DO as Consulting Physician (Neurology) ?Bo Merino, MD as Consulting Physician (Rheumatology) ?Yaakov Guthrie, PsyD (Inactive) (Psychiatry) ?Richard Miu, DMD (Dentistry) ?Jola Baptist, [place] as Referring Physician (Chiropractic Medicine) ?Kennith Center, RD as Dietitian (Family Medicine) ?Kennith Center, RD as Dietitian (Family Medicine) ? ?Hematological/Oncological History ?# Iron Deficiency Anemia 2/2 to Gastric Bypass ?12/04/2020: WBC 6.6, Hgb 11.0, MCV 86.9, Plt 293 ?03/26/2021: WBC 5.9, Hgb 10.3, MCV 86.3, Plt 306, ferritin 8.2 ?04/18/2021: establish care with Dr. Lorenso Courier  ? ?CHIEF COMPLAINTS/PURPOSE OF CONSULTATION:  ?" Iron Deficiency Anemia  " ? ?HISTORY OF PRESENTING ILLNESS:  ?AFTIN LYE 76 y.o. female with medical history significant for chronic left hip pain, vitamin D deficiency, iron deficiency anemia in the setting of gastric bypass, and type 2 diabetes who presents for evaluation of iron deficiency anemia. ? ?On review of the previous records [name] has been having difficulty with iron supplements.  She has been having issues with chronic weakness, headaches, skin pallor, and chronic fatigue.  She is doing her best to eat iron rich foods.  Per outside records she had an iron level of 34 with a ferritin of 8.2.  Etiology is thought to be multifactorial with her history of gastric bypass disease and Barrett's esophagus with history of dysphagia.  Due to concern for persistent iron  deficiency anemia with inability to tolerate p.o. therapy she was referred to hematology for further evaluation and management ? ?On exam today Mrs. Pagan reports that she has difficulty with chronic constipation.  She notes that she was taking iron pills her constipation got so bad that she had to wear rubber gloves in order to disimpact herself.  She reported that her energy levels are quite low but denies any issues with bleeding, bruising, or dark stools.  She has never received IV iron or transfusions before in the past. ? ?On further discussion she notes her family history is markable for hepatitis C in her sister and lung cancer in her mother, lung cancer another sister, and pancreatic cancer another sister.  She had a brother with skin cancer.  She does that she is a never smoker never drinker and previously worked as a Corporate investment banker in [place].  She notes that she does undergo vitamin B12 injections monthly but does continue to have issues with neuropathy.  She otherwise denies any issues with fevers, chills, sweats, nausea, ming or diarrhea.  Full 10 point ROS is listed below. ? ?MEDICAL HISTORY:  ?Past Medical History:  ?Diagnosis Date  ? Adjustment disorder with depressed mood 03/18/2007  ? Allergy   ? Anal fissure 10/08/2016  ? Angina   ? takes Propanolol prn and it stops her chest  ? Anorexia nervosa 11/11/2019  ? Barrett esophagus   ? not consistently present  ? Cataract   ? removed  but had retinal detachment - repeat cataract right eye  ? Cervical facet joint syndrome 10/08/2016  ? Chronic constipation 10/04/2010  ? Chronic interstitial cystitis 10/16/2011  ? Chronic kidney disease   ? kidney stones and UTI  ? Chronic maxillary sinusitis  09/15/2013  ? Chronic narcotic dependence 10/03/2018  ? Complication of anesthesia   ? either  BP or pulse dropped with last surgery  ? Degeneration of cervical intervertebral disc 03/18/2007  ? Degeneration of lumbar or lumbosacral intervertebral disc  03/18/2007  ? Diaphragmatic hernia 03/18/2007  ? Dysrhythmia   ? brought on by stress  ? Epigastric pain 11/11/2019  ? Esophageal dysphagia 10/03/2018  ? External hemorrhoids   ? Fibromyalgia   ? neuropathy knees ankles and toes, bladder  ? Generalized anxiety disorder 03/18/2007  ? GERD (gastroesophageal reflux disease) 03/18/2007  ? HA (headache)-chronic/tension type 07/28/2012  ? Heart murmur   ? Hiatal hernia   ? History of anemia   ? History of gastric bypass 02/11/2014  ? 1999 at [facility]  ? History of hyperparathyroidism 01/22/2016  ? History of kidney stones   ? Hypertension   ? Hypoglycemia 11/08/2016  ? IBS (irritable bowel syndrome)   ? Internal hemorrhoids   ? LLQ abdominal pain 11/11/2019  ? Macular degeneration   ? Malnutrition 11/11/2019  ? Mechanical breakdown implanted electronic neurostimulator spinal cord 11/16/2018  ? Mitral regurgitation 12/11/2015  ? MVP (mitral valve prolapse) 12/17/2017  ? Myalgia 03/18/2007  ? Nephrolithiasis 07/28/2012  ? lithotrip 2013-Eskridge,MD  ? Neuropathic pain 01/23/2018  ? Orthostatic hypotension 11/20/2016  ? Osteoarthritis   ? all over  ? Osteopetrosis   ? Osteoporosis 07/28/2012  ? Peripheral vascular disease   ? superficial phlebitis in left calf  ? Personal history of colonic polyps 07/22/2013  ? 07/2013 - 3 small adenomas - repeat colonoscopy 2018  ? PTSD (post-traumatic stress disorder)   ? Rosacea 11/02/2017  ? Small intestinal bacterial overgrowth 08/05/2017  ? + lactulose breath test 06/2017 Xifaxan Rx  ? Type II diabetes mellitus 11/18/2016  ? ? ?SURGICAL HISTORY: ?Past Surgical History:  ?Procedure Laterality Date  ? ABDOMINAL HYSTERECTOMY    ? APPENDECTOMY    ? CARDIAC CATHETERIZATION  03/22/1991  ? EF 61%  ? CARDIOVASCULAR STRESS TEST  10/03/2006  ? CHOLECYSTECTOMY    ? COLONOSCOPY  06/23/2008  ? normal  ? DILATION AND CURETTAGE OF UTERUS    ? x2  ? ESOPHAGUS SURGERY    ? EXPLORATORY LAPAROTOMY  1993  ? EYE SURGERY    ? GASTRIC BYPASS  1999  ? GASTRIC  RESTRICTION SURGERY  1991  ? LASIK Bilateral   ? Right Arm Surgery    ? Right Knee Arthroscopy    ? Rt wrist fx  2009  ? spinal surgery battery implant    ? stomach stappeling  1991  ? STONE EXTRACTION WITH BASKET  2012  ? TONSILLECTOMY    ? TUBAL LIGATION    ? UPPER GASTROINTESTINAL ENDOSCOPY  06/23/2008  ? w/Dil, Barrett's esophagus  ? US ECHOCARDIOGRAPHY  01/21/2007  ? EF 55-60%  ? US ECHOCARDIOGRAPHY  08/30/2003  ? EF 55-60%  ? ? ?SOCIAL HISTORY: ?Social History  ? ?Socioeconomic History  ? Marital status: Married  ?  Spouse name: Rushie Chestnut.  ? Number of children: 2  ? Years of education: 38  ? Highest education level: Some college, no degree  ?Occupational History  ? Occupation: Disability  ?  Employer: RETIRED  ?Tobacco Use  ? Smoking status: Never  ?  Passive exposure: Past  ? Smokeless tobacco: Never  ?Vaping Use  ? Vaping Use: Never used  ?Substance and Sexual Activity  ? Alcohol use: No  ? Drug use: No  ? Sexual activity: Not Currently  ?  Other Topics Concern  ? Not on file  ?Social History Narrative  ? Right handed  ? Two story home  ? She retired on disability  ? Married  ? 2 Children  ? ?Social Determinants of Health  ? ?Financial Resource Strain: Not on file  ?Food Insecurity: Not on file  ?Transportation Needs: Not on file  ?Physical Activity: Not on file  ?Stress: Not on file  ?Social Connections: Not on file  ?Intimate Partner Violence: Not on file  ? ? ?FAMILY HISTORY: ?Family History  ?Problem Relation Age of Onset  ? Stroke Mother   ? Lung cancer Mother   ? Heart disease Mother   ? Diabetes Mother   ? Hypertension Father   ? Stroke Father   ? Heart disease Father   ? Kidney disease Father   ? Seizures Father   ?     epilepsy, and sisters x 2  ? Memory loss Father   ? Lung cancer Sister   ? Breast cancer Sister   ? Cancer - Lung Sister   ? Heart disease Sister   ? Diabetes Sister   ? Pancreatic cancer Sister   ? Healthy Daughter   ? Healthy Son   ? Colon cancer Maternal Aunt   ?     12 relatives  ? Colon  cancer Paternal Aunt   ? Colon cancer Paternal Aunt   ? Uterine cancer Other   ?     aunt  ? Heart disease Other   ?     grandmother  ? Esophageal cancer Neg Hx   ? Rectal cancer Neg Hx   ? Stomach cancer

## 2021-04-18 NOTE — Telephone Encounter (Signed)
Dr. Lorenso Courier, ?Fyi note: ?Auth Submission: no auth needed ?Payer: MEDICARE A/B (primary) ?[employer] (secondary) ?Medication & CPT/J Code(s) submitted: monoferric - J1437 ?Route of submission (phone, fax, portal): PHONE ?Auth type: Buy/Bill ?Units/visits requested: 1 VISIT ?Reference number: [id number] REF# [id number] ?[employer] REF# [id number] ?Approval from: 04/18/21 to 05/18/21  ? ?Medicare will cover 80% and [employer] will pick up remaining 20%. ?Patient has met $226 medicare deductible. ? ?Patient will be scheduled as soon as possible. ?

## 2021-04-19 ENCOUNTER — Ambulatory Visit (INDEPENDENT_AMBULATORY_CARE_PROVIDER_SITE_OTHER): Payer: Medicare Other

## 2021-04-19 ENCOUNTER — Telehealth: Payer: Self-pay | Admitting: *Deleted

## 2021-04-19 VITALS — BP 100/61 | HR 84 | Temp 98.3°F | Resp 18 | Ht 65.0 in | Wt 109.8 lb

## 2021-04-19 DIAGNOSIS — D508 Other iron deficiency anemias: Secondary | ICD-10-CM

## 2021-04-19 MED ORDER — SODIUM CHLORIDE 0.9 % IV SOLN
1000.0000 mg | Freq: Once | INTRAVENOUS | Status: AC
  Administered 2021-04-19: 1000 mg via INTRAVENOUS
  Filled 2021-04-19: qty 10

## 2021-04-19 NOTE — Telephone Encounter (Signed)
TCT patient regarding her questions about neuropathy.Per Dr. Lorenso Courier, we are waiting on one more test result to come back related to her B12 (methylmalonic Acid) Once we get that back, we will know if it is related to a B12 deficiency. Advised that I will let her know that result when it becomes available. If it is normal, then the neuropathy work up will be in the hands of her PCP.  Pt voiced understanding. ?

## 2021-04-19 NOTE — Telephone Encounter (Signed)
Received vm message from pt stating that she is scheduled for monoferric today as she has iron deficiency anemia. She would like to know what vitamins she should be taking for hr peripheral neuropathy or if there were medications/vitamins she should stop taking that might make her feel better. ? ?Please advise ?

## 2021-04-19 NOTE — Progress Notes (Signed)
Diagnosis: Iron Deficiency Anemia ? ?Provider:  Marshell Garfinkel, MD ? ?Procedure: Infusion ? ?IV Type: Peripheral, IV Location: R Forearm ? ?Monoferric (Ferric Derisomaltose), Dose: 1000 mg ? ?Infusion Start Time: 0052 ? ?Infusion Stop Time: 5910 ? ?Post Infusion IV Care: Observation period completed and Peripheral IV Discontinued ? ?Discharge: Condition: Good, Destination: Home . AVS provided to patient.  ? ?Performed by:  Damali Broadfoot, RN  ?  ?

## 2021-04-20 LAB — METHYLMALONIC ACID, SERUM: Methylmalonic Acid, Quantitative: 260 nmol/L (ref 0–378)

## 2021-04-25 DIAGNOSIS — G43009 Migraine without aura, not intractable, without status migrainosus: Secondary | ICD-10-CM | POA: Diagnosis not present

## 2021-04-25 DIAGNOSIS — R519 Headache, unspecified: Secondary | ICD-10-CM | POA: Diagnosis not present

## 2021-04-25 DIAGNOSIS — K602 Anal fissure, unspecified: Secondary | ICD-10-CM | POA: Diagnosis not present

## 2021-04-25 DIAGNOSIS — D649 Anemia, unspecified: Secondary | ICD-10-CM | POA: Diagnosis not present

## 2021-04-27 DIAGNOSIS — Z20822 Contact with and (suspected) exposure to covid-19: Secondary | ICD-10-CM | POA: Diagnosis not present

## 2021-04-30 DIAGNOSIS — Z20822 Contact with and (suspected) exposure to covid-19: Secondary | ICD-10-CM | POA: Diagnosis not present

## 2021-05-01 DIAGNOSIS — G609 Hereditary and idiopathic neuropathy, unspecified: Secondary | ICD-10-CM | POA: Diagnosis not present

## 2021-05-01 DIAGNOSIS — G8929 Other chronic pain: Secondary | ICD-10-CM | POA: Diagnosis not present

## 2021-05-01 DIAGNOSIS — Z79899 Other long term (current) drug therapy: Secondary | ICD-10-CM | POA: Diagnosis not present

## 2021-05-01 DIAGNOSIS — M542 Cervicalgia: Secondary | ICD-10-CM | POA: Diagnosis not present

## 2021-05-01 DIAGNOSIS — M545 Low back pain, unspecified: Secondary | ICD-10-CM | POA: Diagnosis not present

## 2021-05-04 DIAGNOSIS — Z79899 Other long term (current) drug therapy: Secondary | ICD-10-CM | POA: Diagnosis not present

## 2021-05-09 DIAGNOSIS — R059 Cough, unspecified: Secondary | ICD-10-CM | POA: Diagnosis not present

## 2021-05-09 DIAGNOSIS — Z20822 Contact with and (suspected) exposure to covid-19: Secondary | ICD-10-CM | POA: Diagnosis not present

## 2021-05-09 DIAGNOSIS — R051 Acute cough: Secondary | ICD-10-CM | POA: Diagnosis not present

## 2021-05-14 DIAGNOSIS — Z20822 Contact with and (suspected) exposure to covid-19: Secondary | ICD-10-CM | POA: Diagnosis not present

## 2021-05-15 DIAGNOSIS — G609 Hereditary and idiopathic neuropathy, unspecified: Secondary | ICD-10-CM | POA: Diagnosis not present

## 2021-05-15 DIAGNOSIS — M545 Low back pain, unspecified: Secondary | ICD-10-CM | POA: Diagnosis not present

## 2021-05-15 DIAGNOSIS — Z79899 Other long term (current) drug therapy: Secondary | ICD-10-CM | POA: Diagnosis not present

## 2021-05-15 DIAGNOSIS — M542 Cervicalgia: Secondary | ICD-10-CM | POA: Diagnosis not present

## 2021-05-15 DIAGNOSIS — G8929 Other chronic pain: Secondary | ICD-10-CM | POA: Diagnosis not present

## 2021-05-21 DIAGNOSIS — Z20822 Contact with and (suspected) exposure to covid-19: Secondary | ICD-10-CM | POA: Diagnosis not present

## 2021-05-22 ENCOUNTER — Encounter: Payer: Self-pay | Admitting: Internal Medicine

## 2021-05-22 ENCOUNTER — Ambulatory Visit (INDEPENDENT_AMBULATORY_CARE_PROVIDER_SITE_OTHER): Payer: Medicare Other | Admitting: Internal Medicine

## 2021-05-22 ENCOUNTER — Encounter: Payer: Self-pay | Admitting: Hematology and Oncology

## 2021-05-22 VITALS — BP 94/62 | HR 91 | Ht 65.0 in | Wt 105.0 lb

## 2021-05-22 DIAGNOSIS — R1319 Other dysphagia: Secondary | ICD-10-CM

## 2021-05-22 DIAGNOSIS — K5909 Other constipation: Secondary | ICD-10-CM | POA: Diagnosis not present

## 2021-05-22 DIAGNOSIS — Z9884 Bariatric surgery status: Secondary | ICD-10-CM | POA: Diagnosis not present

## 2021-05-22 DIAGNOSIS — Z79899 Other long term (current) drug therapy: Secondary | ICD-10-CM | POA: Diagnosis not present

## 2021-05-22 DIAGNOSIS — R1013 Epigastric pain: Secondary | ICD-10-CM | POA: Diagnosis not present

## 2021-05-22 NOTE — Progress Notes (Signed)
? ?Kayla [name] 76 y.o. 04/23/1945 [id number] ? ?Assessment & Plan:  ? ?Encounter Diagnoses  ?Name Primary?  ? Esophageal dysphagia Yes  ? Dyspepsia   ? Bariatric surgery status   ? Chronic constipation   ? Polypharmacy   ? ? ? ?Reassess esophageal symptoms with upper GI series with tablet.  Therapeutic options likely limited especially in view of her polypharmacy. ? ?Continue Movantik and other measures to treat constipation in the setting of chronic opioid use and redundant colon. ? ?CC: Kayla Bien, MD ? ? ? ?Subjective:  ? ?Chief Complaint: Dysphagia ? ?HPI ?76 year old woman who scheduled an appointment regarding constipation but says that not why she is here, she is now having esophageal problems again where she has impact dysphagia and chest pain and has to self-induced regurgitation.  Long history of this as well.  She is status post a bariatric surgery and revision and has altered anatomy with small stomach pouch.  EGD in June 2022 with no esophageal abnormality and an empiric 54 French Maloney dilation performed.  Prior upper GI series in 2019 with no dysmotility though there was some reflux and gastroenterostomy status post bariatric surgery present. ? ?Wt Readings from Last 3 Encounters:  ?05/22/21 105 lb (47.6 kg)  ?04/19/21 109 lb 12.8 oz (49.8 kg)  ?04/18/21 106 lb 12.8 oz (48.4 kg)  ? ? ?Colonoscopy 08/15/2020 ?- Decreased sphincter tone found on digital rectal exam. ?- Redundant colon. ?- There was significant looping of the colon. ?- The examination was otherwise normal on direct and retroflexion views. Scope was ?withdrawn and lens cleared to get better visualization (scope reinserted to cecum) ? ?EGD 06/27/2020 ?- No endoscopic abnormality was evident in the esophagus to explain the patient's complaint ?of dysphagia. It was decided, however, to proceed with dilation of the entire esophagus. The ?scope was withdrawn. Dilation was performed with a Maloney dilator with mild resistance  at ?54 Fr. The dilation site was examined following endoscope reinsertion and showed no ?change. Estimated blood loss: none. ? ?- Evidence of a gastrojejunostomy was found in the stomach. This was characterized by ?healthy appearing mucosa. ?- The examined jejunum was normal. ? ?Allergies  ?Allergen Reactions  ? Ambien [Zolpidem Tartrate]   ?  Hallucinations  ? Codeine Anaphylaxis  ? Oxycodone-Acetaminophen Shortness Of Breath  ? Tylox [Oxycodone-Acetaminophen] Anaphylaxis  ?  Chest pain  ? Darvocet [Propoxyphene N-Acetaminophen]   ?  Chest pain  ? Darvon [Propoxyphene] Itching  ? Lorcet [Hydrocodone-Acetaminophen]   ?  Chest pain  ? Opium   ?  hallucinations  ? Vicodin [Hydrocodone-Acetaminophen] Other (See Comments)  ?  Chest Pain   ? Wheat Bran   ? Amoxicillin Nausea And Vomiting  ?  Reaction:  Migraine headache  ? Penicillins Nausea And Vomiting  ?  Migraine Headaches  ? ?Current Meds  ?Medication Sig  ? aspirin-acetaminophen-caffeine (EXCEDRIN MIGRAINE) 250-250-65 MG tablet Take 2 tablets by mouth 2 (two) times daily.  ? Blood Glucose Monitoring Suppl (FREESTYLE FREEDOM LITE) w/Device KIT 1 each by Does not apply route 4 (four) times daily. Use to monitor glucose levels 4 times daily; E11.9  ? CALCIUM PO Take by mouth daily.  ? celecoxib (CELEBREX) 200 MG capsule Take 200 mg by mouth 2 (two) times daily.  ? clonazePAM (KLONOPIN) 0.5 MG tablet Take 0.25 mg by mouth as needed.  ? COLLAGEN PO Take by mouth daily.  ? Continuous Blood Gluc Receiver (FREESTYLE LIBRE 14 DAY READER) DEVI 1 each by Does not  apply route See admin instructions. Use with sensors to monitor glucose levels; E11.9  ? Continuous Blood Gluc Sensor (FREESTYLE LIBRE 14 DAY SENSOR) MISC 1 Device by Does not apply route every 14 (fourteen) days.  ? Copper Gluconate POWD Take by mouth.  ? cyanocobalamin (,VITAMIN B-12,) 1000 MCG/ML injection SMARTSIG:1 Vial(s) IM Once a Month  ? cycloSPORINE (RESTASIS) 0.05 % ophthalmic emulsion Place 1 drop into  both eyes 2 (two) times daily.  ? diclofenac sodium (VOLTAREN) 1 % GEL Apply 2 g topically 4 (four) times daily.  ? dicyclomine (BENTYL) 10 MG capsule Take 1-2 tab 3 times daily AC as needed for spasms and cramping.  ? Docusate Sodium (DSS) 100 MG CAPS Take by mouth.  ? DULoxetine (CYMBALTA) 30 MG capsule Take 30 mg by mouth 2 (two) times daily.  ? Erenumab-aooe 70 MG/ML SOAJ Inject into the skin.  ? esomeprazole (NEXIUM) 40 MG capsule Take 1 capsule (40 mg total) by mouth daily before breakfast.  ? famotidine (PEPCID) 20 MG tablet Take by mouth.  ? fentaNYL (DURAGESIC) 25 MCG/HR 1 patch every 3 (three) days.  ? furosemide (LASIX) 40 MG tablet Take by mouth.  ? Galcanezumab-gnlm (EMGALITY) 120 MG/ML SOAJ Inject 120 mg into the skin every 28 (twenty-eight) days.  ? glucagon 1 MG injection Inject 1 mg into the muscle once as needed for up to 1 dose.  ? glucose blood (FREESTYLE TEST STRIPS) test strip Use to monitor glucose levels 4 times daily; E11.9  ? HYDROcodone-acetaminophen (NORCO) 10-325 MG tablet Take 1 tablet by mouth every 6 (six) hours as needed for moderate pain. 1/2 tablet as needed  ? hydrocortisone cream 1 % Apply topically.  ? hypromellose (GENTEAL) 0.3 % GEL ophthalmic ointment Place 1 drop into both eyes at bedtime. Reported on 02/11/2015  ? Lancets (FREESTYLE) lancets Use to monitor glucose levels 4 times daily; E11.9  ? latanoprost (XALATAN) 0.005 % ophthalmic solution Apply to eye.  ? lidocaine (LIDODERM) 5 % Place 1 patch onto the skin as needed. Remove & Discard patch within 12 hours. May dispense as 3 month supply  ? Lifitegrast 5 % SOLN Place 1 drop into both eyes daily.  ? metroNIDAZOLE (METROGEL) 1 % gel Apply topically daily.  ? mirabegron ER (MYRBETRIQ) 25 MG TB24 tablet Take 1 tablet (25 mg total) by mouth daily.  ? Multiple Vitamins-Minerals (PRESERVISION AREDS) CAPS Take 1 capsule by mouth 2 (two) times daily.   ? naloxegol oxalate (MOVANTIK) 25 MG TABS tablet Take 25 mg by mouth daily.   ? Naloxone HCl 0.4 MG/0.4ML SOAJ Administer 0.10m [place] at first sign of opioid overdose and repeat every 2 minutes as needed for resuscitation. Call 911 immediately  ? NRidgely ?Peripheral Neuropathy Cream- ?Bupivacaine 1%, Doxepin 3%, Gabapentin 6%, Pentoxifylline 3%, Topiramate 1% ?Apply 1-2 grams to affected area 3-4 times daily ?Qty. 120 gm ?3 refills  ? NON FORMULARY GABA6%LIDO5%DICL3%  ? nystatin-triamcinolone (MYCOLOG II) cream   ? ondansetron (ZOFRAN) 8 MG tablet Take 1 tablet (8 mg total) by mouth every 8 (eight) hours as needed for nausea or vomiting.  ? phenazopyridine (PYRIDIUM) 95 MG tablet Take by mouth.  ? potassium chloride SA (KLOR-CON M) 20 MEQ tablet Take by mouth.  ? pregabalin (LYRICA) 50 MG capsule Take 2 capsules (100 mg total) by mouth 2 (two) times daily.  ? Probiotic Product (PROBIOTIC-10 PO) Take by mouth.   ? sucralfate (CARAFATE) 1 GM/10ML suspension Take 10 mLs (1 g total) by mouth  4 (four) times daily.  ? SUMAtriptan (IMITREX) 100 MG tablet Take 1 tablet at earliest onset of headache, may repeat in 2 hours if headache persists or reoccurs.  ? traZODone (DESYREL) 100 MG tablet Take by mouth.  ? Vitamin D, Ergocalciferol, (DRISDOL) 1.25 MG (50000 UT) CAPS capsule Take 1 capsule (50,000 Units total) by mouth every 7 (seven) days.  ? ?Past Medical History:  ?Diagnosis Date  ? Adjustment disorder with depressed mood 03/18/2007  ? Allergy   ? Anal fissure 10/08/2016  ? Angina   ? takes Propanolol prn and it stops her chest  ? Anorexia nervosa 11/11/2019  ? Barrett esophagus   ? not consistently present  ? Cataract   ? removed  but had retinal detachment - repeat cataract right eye  ? Cervical facet joint syndrome 10/08/2016  ? Chronic constipation 10/04/2010  ? Chronic interstitial cystitis 10/16/2011  ? Chronic kidney disease   ? kidney stones and UTI  ? Chronic maxillary sinusitis 09/15/2013  ? Chronic narcotic dependence 10/03/2018  ? Complication of anesthesia   ?  either  BP or pulse dropped with last surgery  ? Degeneration of cervical intervertebral disc 03/18/2007  ? Degeneration of lumbar or lumbosacral intervertebral disc 03/18/2007  ? Diaphragmatic hernia 03/0

## 2021-05-22 NOTE — Patient Instructions (Addendum)
You will be contacted by [facility] in the next 2 days to arrange a upper GI series.  The number on your caller ID will be [phone], please answer when they call.  If you have not heard from them in 2 days please call [phone] to schedule.   ? ? ?I appreciate the opportunity to care for you. ?Silvano Rusk, MD, [facility] ? ?

## 2021-05-23 ENCOUNTER — Ambulatory Visit (INDEPENDENT_AMBULATORY_CARE_PROVIDER_SITE_OTHER): Payer: Medicare Other | Admitting: Psychology

## 2021-05-23 DIAGNOSIS — F411 Generalized anxiety disorder: Secondary | ICD-10-CM | POA: Diagnosis not present

## 2021-05-23 DIAGNOSIS — F4321 Adjustment disorder with depressed mood: Secondary | ICD-10-CM | POA: Diagnosis not present
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
## 2021-05-25 DIAGNOSIS — Z1339 Encounter for screening examination for other mental health and behavioral disorders: Secondary | ICD-10-CM | POA: Diagnosis not present

## 2021-05-25 DIAGNOSIS — Z1331 Encounter for screening for depression: Secondary | ICD-10-CM | POA: Diagnosis not present

## 2021-05-25 DIAGNOSIS — K219 Gastro-esophageal reflux disease without esophagitis: Secondary | ICD-10-CM | POA: Diagnosis not present

## 2021-05-25 DIAGNOSIS — D519 Vitamin B12 deficiency anemia, unspecified: Secondary | ICD-10-CM | POA: Diagnosis not present

## 2021-05-25 DIAGNOSIS — E46 Unspecified protein-calorie malnutrition: Secondary | ICD-10-CM | POA: Diagnosis not present

## 2021-05-25 DIAGNOSIS — Z0000 Encounter for general adult medical examination without abnormal findings: Secondary | ICD-10-CM | POA: Diagnosis not present

## 2021-05-25 DIAGNOSIS — R21 Rash and other nonspecific skin eruption: Secondary | ICD-10-CM | POA: Diagnosis not present

## 2021-05-25 DIAGNOSIS — Z79891 Long term (current) use of opiate analgesic: Secondary | ICD-10-CM | POA: Diagnosis not present

## 2021-05-29 DIAGNOSIS — N3941 Urge incontinence: Secondary | ICD-10-CM | POA: Diagnosis not present

## 2021-05-30 ENCOUNTER — Telehealth: Payer: Self-pay | Admitting: Nurse Practitioner

## 2021-05-30 ENCOUNTER — Other Ambulatory Visit: Payer: Self-pay | Admitting: Hematology and Oncology

## 2021-05-30 ENCOUNTER — Other Ambulatory Visit: Payer: Self-pay

## 2021-05-30 ENCOUNTER — Telehealth: Payer: Self-pay

## 2021-05-30 ENCOUNTER — Inpatient Hospital Stay (HOSPITAL_BASED_OUTPATIENT_CLINIC_OR_DEPARTMENT_OTHER): Payer: Medicare Other | Admitting: Hematology and Oncology

## 2021-05-30 ENCOUNTER — Inpatient Hospital Stay: Payer: Medicare Other | Attending: Hematology and Oncology

## 2021-05-30 VITALS — BP 99/61 | HR 106 | Temp 98.2°F | Resp 18 | Wt 101.0 lb

## 2021-05-30 DIAGNOSIS — D508 Other iron deficiency anemias: Secondary | ICD-10-CM | POA: Diagnosis not present

## 2021-05-30 DIAGNOSIS — D509 Iron deficiency anemia, unspecified: Secondary | ICD-10-CM | POA: Diagnosis not present

## 2021-05-30 LAB — CBC WITH DIFFERENTIAL (CANCER CENTER ONLY)
Abs Immature Granulocytes: 0.02 10*3/uL (ref 0.00–0.07)
Basophils Absolute: 0.1 10*3/uL (ref 0.0–0.1)
Basophils Relative: 1 %
Eosinophils Absolute: 0.4 10*3/uL (ref 0.0–0.5)
Eosinophils Relative: 6 %
HCT: 38.9 % (ref 36.0–46.0)
Hemoglobin: 12.7 g/dL (ref 12.0–15.0)
Immature Granulocytes: 0 %
Lymphocytes Relative: 35 %
Lymphs Abs: 2.6 10*3/uL (ref 0.7–4.0)
MCH: 29.3 pg (ref 26.0–34.0)
MCHC: 32.6 g/dL (ref 30.0–36.0)
MCV: 89.6 fL (ref 80.0–100.0)
Monocytes Absolute: 0.5 10*3/uL (ref 0.1–1.0)
Monocytes Relative: 7 %
Neutro Abs: 3.8 10*3/uL (ref 1.7–7.7)
Neutrophils Relative %: 51 %
Platelet Count: 264 10*3/uL (ref 150–400)
RBC: 4.34 MIL/uL (ref 3.87–5.11)
RDW: 17.7 % — ABNORMAL HIGH (ref 11.5–15.5)
WBC Count: 7.5 10*3/uL (ref 4.0–10.5)
nRBC: 0 % (ref 0.0–0.2)

## 2021-05-30 LAB — CMP (CANCER CENTER ONLY)
ALT: 29 U/L (ref 0–44)
AST: 35 U/L (ref 15–41)
Albumin: 3.7 g/dL (ref 3.5–5.0)
Alkaline Phosphatase: 47 U/L (ref 38–126)
Anion gap: 5 (ref 5–15)
BUN: 28 mg/dL — ABNORMAL HIGH (ref 8–23)
CO2: 37 mmol/L — ABNORMAL HIGH (ref 22–32)
Calcium: 9.3 mg/dL (ref 8.9–10.3)
Chloride: 97 mmol/L — ABNORMAL LOW (ref 98–111)
Creatinine: 0.87 mg/dL (ref 0.44–1.00)
GFR, Estimated: >60 mL/min (ref >60)
Glucose, Bld: 104 mg/dL — ABNORMAL HIGH (ref 70–99)
Potassium: 3.8 mmol/L (ref 3.5–5.1)
Sodium: 139 mmol/L (ref 135–145)
Total Bilirubin: 0.4 mg/dL (ref 0.3–1.2)
Total Protein: 5.8 g/dL — ABNORMAL LOW (ref 6.5–8.1)

## 2021-05-30 LAB — IRON AND IRON BINDING CAPACITY (CC-WL,HP ONLY)
Iron: 86 ug/dL (ref 28–170)
Saturation Ratios: 26 % (ref 10.4–31.8)
TIBC: 326 ug/dL (ref 250–450)
UIBC: 240 ug/dL (ref 148–442)

## 2021-05-30 LAB — RETIC PANEL
Immature Retic Fract: 11.7 % (ref 2.3–15.9)
RBC.: 4.32 MIL/uL (ref 3.87–5.11)
Retic Count, Absolute: 86.4 10*3/uL (ref 19.0–186.0)
Retic Ct Pct: 2 % (ref 0.4–3.1)
Reticulocyte Hemoglobin: 34.8 pg (ref >27.9)

## 2021-05-30 LAB — FERRITIN: Ferritin: 79 ng/mL (ref 11–307)

## 2021-05-30 NOTE — Telephone Encounter (Signed)
Spoke with patient regarding the Palliative referral/services and all questions were answered and she was in agreement with beginning services with [place].  I have scheduled a MyChart Consult for 05/31/21 @ 2 PM ?

## 2021-05-30 NOTE — Telephone Encounter (Signed)
Attempted to contact patient to schedule Palliative Consult, no answer - left message with reason for call along with my name and call back number requesting return call. ?

## 2021-05-30 NOTE — Telephone Encounter (Addendum)
Called patient to advise of message below. Pt voiced understanding. ? ?----- Message from Orson Slick, MD sent at 05/30/2021  3:07 PM EDT ----- ?Please let [name] know that her iron labs look excellent.  There is no need for repeat iron dosing at this time.  We will plan to see her back in 3 months for repeat labs in 6 months for clinic visit. ?----- Message ----- ?From: Interface, Lab In [facility] ?Sent: 05/30/2021  10:18 AM EDT ?To: Orson Slick, MD ? ? ?

## 2021-05-30 NOTE — Progress Notes (Signed)
?[place] ?[phone]   [phone] ? ?PROGRESS NOTE ? ?Patient Care Team: ?[name], MD as PCP - General (Family Medicine) ?Nahser, Wonda Cheng, MD as PCP - Cardiology (Cardiology) ?Rainwater, Willey Blade, PA-C as Physician Assistant (Physician Assistant) ?Stoneking, Reece Leader., MD as Referring Physician (Urology) ?Calvert Cantor, MD as Consulting Physician (Ophthalmology) ?Gatha Mayer, MD as Consulting Physician (Gastroenterology) ?Pieter Partridge, DO as Consulting Physician (Neurology) ?Bo Merino, MD as Consulting Physician (Rheumatology) ?Yaakov Guthrie, PsyD (Inactive) (Psychiatry) ?Richard Miu, DMD (Dentistry) ?Jola Baptist, [place] as Referring Physician (Chiropractic Medicine) ?Kennith Center, RD as Dietitian (Family Medicine) ?Kennith Center, RD as Dietitian (Family Medicine) ? ?Hematological/Oncological History ?# Iron Deficiency Anemia 2/2 to Gastric Bypass ?12/04/2020: WBC 6.6, Hgb 11.0, MCV 86.9, Plt 293 ?03/26/2021: WBC 5.9, Hgb 10.3, MCV 86.3, Plt 306, ferritin 8.2 ?04/18/2021: establish care with Dr. Lorenso Courier  ?04/19/2021: Received 1 dose of IV Monoferric 1000 mg ?05/30/2021: WBC 7.5, Hgb 12.7, MCV 89.6, Plt 264 ? ?Interval History:  ?Kayla [name] 76 y.o. female with medical history significant for iron deficiency anemia secondary to gastric bypass who presents for a follow up visit. The patient's last visit was on 04/18/2021 at which time she established care. In the interim since the last visit she received a dose of Monoferric on 04/19/2021. ? ?On exam today Kayla [name] reports that she tolerated the IV iron therapy quite well.  She did not have any reactions or side effects as result of the iron infusion.  She reports it took about 1 week but afterwards her symptoms began improving.  She notes that her energy levels unfortunately have already begun to drop.  She notes that she is having increasing fatigue but no lightheadedness or dizziness.  She  does have an appetite but is not able to eat food due to her gastric bypass issues.  She notes that she has had an 8 pound weight loss in the interim since her last visit.  She continues to work with endocrinology and a dietitian at [facility] in order to try to improve her nutritional status.  She currently denies any fevers, chills, sweats, nausea, or diarrhea.  A full 10 point ROS is listed below. ? ?MEDICAL HISTORY:  ?Past Medical History:  ?Diagnosis Date  ? Adjustment disorder with depressed mood 03/18/2007  ? Allergy   ? Anal fissure 10/08/2016  ? Angina   ? takes Propanolol prn and it stops her chest  ? Anorexia nervosa 11/11/2019  ? Barrett esophagus   ? not consistently present  ? Cataract   ? removed  but had retinal detachment - repeat cataract right eye  ? Cervical facet joint syndrome 10/08/2016  ? Chronic constipation 10/04/2010  ? Chronic interstitial cystitis 10/16/2011  ? Chronic kidney disease   ? kidney stones and UTI  ? Chronic maxillary sinusitis 09/15/2013  ? Chronic narcotic dependence 10/03/2018  ? Complication of anesthesia   ? either  BP or pulse dropped with last surgery  ? Degeneration of cervical intervertebral disc 03/18/2007  ? Degeneration of lumbar or lumbosacral intervertebral disc 03/18/2007  ? Diaphragmatic hernia 03/18/2007  ? Dysrhythmia   ? brought on by stress  ? Epigastric pain 11/11/2019  ? Esophageal dysphagia 10/03/2018  ? External hemorrhoids   ? Fibromyalgia   ? neuropathy knees ankles and toes, bladder  ? Generalized anxiety disorder 03/18/2007  ? GERD (gastroesophageal reflux disease) 03/18/2007  ? HA (headache)-chronic/tension type 07/28/2012  ? Heart murmur   ?  Hiatal hernia   ? History of anemia   ? History of gastric bypass 02/11/2014  ? [facility]  ? History of hyperparathyroidism 01/22/2016  ? History of kidney stones   ? Hypertension   ? Hypoglycemia 11/08/2016  ? IBS (irritable bowel syndrome)   ? Internal hemorrhoids   ? LLQ abdominal pain 11/11/2019  ?  Macular degeneration   ? Malnutrition 11/11/2019  ? Mechanical breakdown implanted electronic neurostimulator spinal cord 11/16/2018  ? Mitral regurgitation 12/11/2015  ? MVP (mitral valve prolapse) 12/17/2017  ? Myalgia 03/18/2007  ? Nephrolithiasis 07/28/2012  ? lithotrip 2013-[place]ridge,MD  ? Neuropathic pain 01/23/2018  ? Orthostatic hypotension 11/20/2016  ? Osteoarthritis   ? all over  ? Osteopetrosis   ? Osteoporosis 07/28/2012  ? Peripheral vascular disease   ? superficial phlebitis in left calf  ? Personal history of colonic polyps 07/22/2013  ? 07/2013 - 3 small adenomas - repeat colonoscopy 2018  ? PTSD (post-traumatic stress disorder)   ? Rosacea 11/02/2017  ? Small intestinal bacterial overgrowth 08/05/2017  ? + lactulose breath test 06/2017 Xifaxan Rx  ? Type II diabetes mellitus 11/18/2016  ? ? ?SURGICAL HISTORY: ?Past Surgical History:  ?Procedure Laterality Date  ? ABDOMINAL HYSTERECTOMY    ? APPENDECTOMY    ? CARDIAC CATHETERIZATION  03/22/1991  ? EF 61%  ? CARDIOVASCULAR STRESS TEST  10/03/2006  ? CHOLECYSTECTOMY    ? COLONOSCOPY  06/23/2008  ? normal  ? DILATION AND CURETTAGE OF UTERUS    ? x2  ? ESOPHAGUS SURGERY    ? EXPLORATORY LAPAROTOMY  1993  ? EYE SURGERY    ? GASTRIC BYPASS  1999  ? GASTRIC RESTRICTION SURGERY  1991  ? LASIK Bilateral   ? Right Arm Surgery    ? Right Knee Arthroscopy    ? Rt wrist fx  2009  ? spinal surgery battery implant    ? stomach stappeling  1991  ? STONE EXTRACTION WITH BASKET  2012  ? TONSILLECTOMY    ? TUBAL LIGATION    ? UPPER GASTROINTESTINAL ENDOSCOPY  06/23/2008  ? w/Dil, Barrett's esophagus  ? US ECHOCARDIOGRAPHY  01/21/2007  ? EF 55-60%  ? US ECHOCARDIOGRAPHY  08/30/2003  ? EF 55-60%  ? ? ?SOCIAL HISTORY: ?Social History  ? ?Socioeconomic History  ? Marital status: Married  ?  Spouse name: Rushie Chestnut.  ? Number of children: 2  ? Years of education: 76  ? Highest education level: Some college, no degree  ?Occupational History  ? Occupation: Disability  ?  Employer: RETIRED   ?Tobacco Use  ? Smoking status: Never  ?  Passive exposure: Past  ? Smokeless tobacco: Never  ?Vaping Use  ? Vaping Use: Never used  ?Substance and Sexual Activity  ? Alcohol use: No  ? Drug use: No  ? Sexual activity: Not Currently  ?Other Topics Concern  ? Not on file  ?Social History Narrative  ? Right handed  ? Two story home  ? She retired on disability  ? Married  ? 2 Children  ? ?Social Determinants of Health  ? ?Financial Resource Strain: Not on file  ?Food Insecurity: Not on file  ?Transportation Needs: Not on file  ?Physical Activity: Not on file  ?Stress: Not on file  ?Social Connections: Not on file  ?Intimate Partner Violence: Not on file  ? ? ?FAMILY HISTORY: ?Family History  ?Problem Relation Age of Onset  ? Stroke Mother   ? Lung cancer Mother   ?  Heart disease Mother   ? Diabetes Mother   ? Hypertension Father   ? Stroke Father   ? Heart disease Father   ? Kidney disease Father   ? Seizures Father   ?     epilepsy, and sisters x 2  ? Memory loss Father   ? Lung cancer Sister   ? Breast cancer Sister   ? Cancer - Lung Sister   ? Heart disease Sister   ? Diabetes Sister   ? Pancreatic cancer Sister   ? Healthy Daughter   ? Healthy Son   ? Colon cancer Maternal Aunt   ?     12 relatives  ? Colon cancer Paternal Aunt   ? Colon cancer Paternal Aunt   ? Uterine cancer Other   ?     aunt  ? Heart disease Other   ?     grandmother  ? Esophageal cancer Neg Hx   ? Rectal cancer Neg Hx   ? Stomach cancer Neg Hx   ? Colon polyps Neg Hx   ? ? ?ALLERGIES:  is allergic to [employer] tartrate], codeine, oxycodone-acetaminophen, tylox [oxycodone-acetaminophen], darvocet [propoxyphene n-acetaminophen], darvon [propoxyphene], lorcet [hydrocodone-acetaminophen], opium, vicodin [hydrocodone-acetaminophen], wheat bran, amoxicillin, and penicillins. ? ?MEDICATIONS:  ?Current Outpatient Medications  ?Medication Sig Dispense Refill  ? aspirin-acetaminophen-caffeine (EXCEDRIN MIGRAINE) 250-250-65 MG tablet Take 2  tablets by mouth 2 (two) times daily.    ? Blood Glucose Monitoring Suppl (FREESTYLE FREEDOM LITE) w/Device KIT 1 each by Does not apply route 4 (four) times daily. Use to monitor glucose levels 4 times daily

## 2021-05-31 ENCOUNTER — Telehealth: Payer: Medicare Other | Admitting: Nurse Practitioner

## 2021-05-31 ENCOUNTER — Encounter: Payer: Self-pay | Admitting: Nurse Practitioner

## 2021-05-31 ENCOUNTER — Telehealth: Payer: Self-pay | Admitting: Hematology and Oncology

## 2021-05-31 DIAGNOSIS — R630 Anorexia: Secondary | ICD-10-CM

## 2021-05-31 DIAGNOSIS — R5381 Other malaise: Secondary | ICD-10-CM | POA: Diagnosis not present

## 2021-05-31 NOTE — Progress Notes (Signed)
[facility] Consult Note Telephone: [phone]  Fax: [phone]   Date of encounter: 05/31/21 2:45 PM PATIENT NAME: Kayla [name] [id number]   [phone] (home)  DOB: 09/22/1945 MRN: [id number] PRIMARY CARE PROVIDER:    [name], MD,  [address] [place] [address] [phone]  REFERRING PROVIDER:   [name], MD [address] [place],  [place] [address] [phone]  RESPONSIBLE PARTY:    Contact Information     Name Relation Home Work Mobile   [place] D Spouse   [phone]   Brock Ra Daughter  [phone]       Due to the COVID-19 crisis, this visit was done via telemedicine from my office and it was initiated and consent by this patient and or family.  I connected with  Kayla [name] OR PROXY on 05/31/21 by a video enabled telemedicine application and verified that I am speaking with the correct person using two identifiers.   I discussed the limitations of evaluation and management by telemedicine. The patient expressed understanding and agreed to proceed.  Palliative Care was asked to follow this patient by consultation request of  [name], MD to address advance care planning and complex medical decision making. This is the initial visit.                     ASSESSMENT AND PLAN / RECOMMENDATIONS:  Advance Care Planning/Goals of Care: Goals include to maximize quality of life and symptom management. Patient/health care surrogate gave his/her permission to discuss.Our advance care planning conversation included a discussion about:    The value and importance of advance care planning  Experiences with loved ones who have been seriously ill or have died  Exploration of personal, cultural or spiritual beliefs that might influence medical decisions  Exploration of goals of care in the event of a sudden  injury or illness  Identification  of a healthcare agent  Review and updating or creation of an  advance directive document . Decision not to resuscitate or to de-escalate disease focused treatments due to poor prognosis. CODE STATUS: Full code  Symptom Management/Plan: 1. Advance Care Planning;  Full code with aggressive interventions  2. Anorexia secondary to multifactorial discussed multiple disease processes with progression including   05/30/2021 weight 101 lbs 05/22/2021 weight 105 lbs 04/18/2021 weight 106 lbs BMI 16.81  Iron Deficiency Anemia 2/2 to Gastric Bypass 12/04/2020: WBC 6.6, Hgb 11.0, MCV 86.9, Plt 293 03/26/2021: WBC 5.9, Hgb 10.3, MCV 86.3, Plt 306, ferritin 8.2 04/18/2021: establish care with Dr. Lorenso Courier  04/19/2021: Received 1 dose of IV Monoferric 1000 mg 05/30/2021: WBC 7.5, Hgb 12.7, MCV 89.6, Plt 264  3. Goals of Care: Goals include to maximize quality of life and symptom management. Our advance care planning conversation included a discussion about:    The value and importance of advance care planning  Exploration of personal, cultural or spiritual beliefs that might influence medical decisions  Exploration of goals of care in the event of a sudden injury or illness  Identification and preparation of a healthcare agent  Review and updating or creation of an advance directive document.  4. Debility secondary to multifactorial. Discussed at length about functional abilities, Kayla [name] is able to transfer to w/c, ambulate with great difficulty secondary to neuropathy pain which is being managed with lyrica 143m bid; gabapentin in addition to chronic pain also managed with  fentanyl patches 28mcg q 72hrs and prn hydrocodone $RemoveBeforeD'10mg'[id number]$ /$RemoveBef'325mg'DzdtzTeXBo$  q6hrs prn, cymbalta $RemoveBefor'30mg'AQNWAWncQcBX$  bid; We talked about Kayla [name] no longer able to prepare meals, she does receive help from grandchildren. Discussed will ask PC SW to consult for any further community resources including housekeeping, care needs,  environmental assessment.   5. Palliative care encounter; Palliative care encounter; Palliative medicine team will continue to support patient, patient's family, and medical team. Visit consisted of counseling and education dealing with the complex and emotionally intense issues of symptom management and palliative care in the setting of serious and potentially life-threatening illness Follow up Palliative Care Visit: Palliative care will continue to follow for complex medical decision making, advance care planning, and clarification of goals. Return 1 week for [facility] RN then 4 weeks for [facility] NP or prn.  I spent 64 minutes providing this consultation. More than 50% of the time in this consultation was spent in counseling and care coordination. PPS: 40% Chief Complaint: Initial palliative consult for complex medical decision making HISTORY OF PRESENT ILLNESS:  Kayla [name] is a 76 y.o. year old female  with multiple medical problems including h/o gastric bypass sgy with revision and altered anatomy with small stomach pouch secondary to Barrett esophagus, dysphagia, IDA, anorexia, angina, degeneration of cervical, lumbosacral discs, cervical facet joint syndrome, fibromyalgia, chronic pain, chronic interstitial cystitis, ckd, nephrolithiasis, kidney stones, chronic maxillary sinusitis, gerd, h/o hiatal hernia, chronic migrane h/a, hyperparathyroidism, IBS, HTN, hypoglycemia, DM, heart murmur, mitral regurgitation, MVP, neuropathy, osteoporosis, pvd, PTSD, anxiety. I connected with Kayla [name] for mychart initial palliative consult. We talked about purpose pc visit, the last time Kayla [name] was independent, past medical history at length and symptoms associated with each disease progression. We talked about symptoms including pain which currently being managed, though the neuropathy decreases her functional abilities. We talked about Kayla [name] residing at home with her husband, life review, family and social  history. We talked about anorexia, weight loss. Kayla [name] talked at length about the experiences with having bariatric sgy then revision and now concerns with ongoing weight loss. We talked about recent iron infusion with possible initiation of TPA since she continues to loose weight. We talked about in home primary care such as [facility]. Kayla [name] is in agreement, will do referral to [facility]. We talked about medical goals including code status which Kayla [name] wants to live for her family, herself with wishes for aggressive interventions. We talked about having PC RN/SW make a visit within 1 to 2 weeks for follow up further discussion of goc, resources. Kayla [name] in agreement. Therapeutic listening, emotional support provided. Questions answered. Contact information provided.   History obtained from review of EMR, discussion with Kayla [name].  I reviewed available labs, medications, imaging, studies and related documents from the EMR.  Records reviewed and summarized above.   ROS 10 point system reviewed all negative except positives HPI  Physical Exam: deferred CURRENT PROBLEM LIST:  Patient Active Problem List   Diagnosis Date Noted   Iron deficiency anemia due to dietary causes 04/18/2021   Peripheral neuropathy 04/03/2021   Chronic kidney disease 03/22/2021   Macular degeneration 03/22/2021   PTSD (post-traumatic stress disorder)    Epigastric pain 11/11/2019   Anorexia nervosa 11/11/2019   LLQ abdominal pain 11/11/2019   Malnutrition 11/11/2019   Mechanical breakdown implanted electronic neurostimulator spinal cord 11/16/2018   Esophageal dysphagia 10/03/2018   Neuropathic pain 01/23/2018   MVP (mitral valve prolapse) 12/17/2017   Rosacea 11/02/2017  Small intestinal bacterial overgrowth 08/05/2017   Orthostatic hypotension 11/20/2016   Type II diabetes mellitus 11/18/2016   Hypoglycemia 11/08/2016   Cervical facet joint syndrome 10/08/2016   Anal fissure  10/08/2016   Peripheral vascular disease 01/22/2016   Hypertension 01/22/2016   History of hyperparathyroidism 01/22/2016   Mitral regurgitation 12/11/2015   History of gastric bypass 02/11/2014   Chronic maxillary sinusitis 09/15/2013   Personal history of colonic polyps 07/22/2013   HA (headache)-chronic/tension type 07/28/2012   Osteoporosis 07/28/2012   Nephrolithiasis 07/28/2012   Fibromyalgia 07/26/2012   Chronic interstitial cystitis 10/16/2011   Chronic constipation 10/04/2010   Loss of weight 11/27/2009   Generalized anxiety disorder 03/18/2007   Adjustment disorder with depressed mood 03/18/2007   GERD (gastroesophageal reflux disease) 03/18/2007   Diaphragmatic hernia 03/18/2007   Osteoarthritis 03/18/2007   Degeneration of cervical intervertebral disc 03/18/2007   Degeneration of lumbar or lumbosacral intervertebral disc 03/18/2007   Myalgia 03/18/2007   PAST MEDICAL HISTORY:  Active Ambulatory Problems    Diagnosis Date Noted   Generalized anxiety disorder 03/18/2007   Adjustment disorder with depressed mood 03/18/2007   GERD (gastroesophageal reflux disease) 03/18/2007   Diaphragmatic hernia 03/18/2007   Osteoarthritis 03/18/2007   Degeneration of cervical intervertebral disc 03/18/2007   Degeneration of lumbar or lumbosacral intervertebral disc 03/18/2007   Myalgia 03/18/2007   Loss of weight 11/27/2009   Chronic constipation 10/04/2010   Chronic interstitial cystitis 10/16/2011   Fibromyalgia 07/26/2012   HA (headache)-chronic/tension type 07/28/2012   Osteoporosis 07/28/2012   Nephrolithiasis 07/28/2012   Personal history of colonic polyps 07/22/2013   Chronic maxillary sinusitis 09/15/2013   History of gastric bypass 02/11/2014   Mitral regurgitation 12/11/2015   Peripheral vascular disease 01/22/2016   Hypertension 01/22/2016   History of hyperparathyroidism 01/22/2016   Cervical facet joint syndrome 10/08/2016   Anal fissure 10/08/2016    Hypoglycemia 11/08/2016   Type II diabetes mellitus 11/18/2016   Orthostatic hypotension 11/20/2016   Small intestinal bacterial overgrowth 08/05/2017   Rosacea 11/02/2017   MVP (mitral valve prolapse) 12/17/2017   Esophageal dysphagia 10/03/2018   Mechanical breakdown implanted electronic neurostimulator spinal cord 11/16/2018   Neuropathic pain 01/23/2018   Epigastric pain 11/11/2019   Anorexia nervosa 11/11/2019   LLQ abdominal pain 11/11/2019   Malnutrition 11/11/2019   Chronic kidney disease 03/22/2021   Macular degeneration 03/22/2021   PTSD (post-traumatic stress disorder)    Peripheral neuropathy 04/03/2021   Iron deficiency anemia due to dietary causes 04/18/2021   Resolved Ambulatory Problems    Diagnosis Date Noted   NAUSEA 11/27/2009   Personal history of failed moderate sedation 06/01/2008   Unspecified vitamin D deficiency-2009 07/28/2012   stones 03/16/2014   Vitamin D deficiency 03/08/2015   Chest pain 12/11/2015   Chronic narcotic dependence 10/03/2018   History of genetic counseling 02/02/2016   Past Medical History:  Diagnosis Date   Allergy    Angina    Barrett esophagus    Cataract    Complication of anesthesia    Dysrhythmia    External hemorrhoids    Heart murmur    Hiatal hernia    History of anemia    History of kidney stones    IBS (irritable bowel syndrome)    Internal hemorrhoids    Osteopetrosis    SOCIAL HX:  Social History   Tobacco Use   Smoking status: Never    Passive exposure: Past   Smokeless tobacco: Never  Substance Use Topics   Alcohol use:  No   FAMILY HX:  Family History  Problem Relation Age of Onset   Stroke Mother    Lung cancer Mother    Heart disease Mother    Diabetes Mother    Hypertension Father    Stroke Father    Heart disease Father    Kidney disease Father    Seizures Father        epilepsy, and sisters x 2   Memory loss Father    Lung cancer Sister    Breast cancer Sister    Cancer - Lung  Sister    Heart disease Sister    Diabetes Sister    Pancreatic cancer Sister    Healthy Daughter    Healthy Son    Colon cancer Maternal Aunt        12 relatives   Colon cancer Paternal Aunt    Colon cancer Paternal Aunt    Uterine cancer Other        aunt   Heart disease Other        grandmother   Esophageal cancer Neg Hx    Rectal cancer Neg Hx    Stomach cancer Neg Hx    Colon polyps Neg Hx       ALLERGIES:  Allergies  Allergen Reactions   Ambien [Zolpidem Tartrate]     Hallucinations   Codeine Anaphylaxis   Oxycodone-Acetaminophen Shortness Of Breath   Tylox [Oxycodone-Acetaminophen] Anaphylaxis    Chest pain   Darvocet [Propoxyphene N-Acetaminophen]     Chest pain   Darvon [Propoxyphene] Itching   Lorcet [Hydrocodone-Acetaminophen]     Chest pain   Opium     hallucinations   Vicodin [Hydrocodone-Acetaminophen] Other (See Comments)    Chest Pain    Wheat Bran    Amoxicillin Nausea And Vomiting    Reaction:  Migraine headache   Penicillins Nausea And Vomiting    Migraine Headaches     PERTINENT MEDICATIONS:  Outpatient Encounter Medications as of 05/31/2021  Medication Sig   aspirin-acetaminophen-caffeine (EXCEDRIN MIGRAINE) 250-250-65 MG tablet Take 2 tablets by mouth 2 (two) times daily.   Blood Glucose Monitoring Suppl (FREESTYLE FREEDOM LITE) w/Device KIT 1 each by Does not apply route 4 (four) times daily. Use to monitor glucose levels 4 times daily; E11.9   CALCIUM PO Take by mouth daily.   celecoxib (CELEBREX) 200 MG capsule Take 200 mg by mouth 2 (two) times daily.   clonazePAM (KLONOPIN) 0.5 MG tablet Take 0.25 mg by mouth as needed.   COLLAGEN PO Take by mouth daily.   Continuous Blood Gluc Receiver (FREESTYLE LIBRE 14 DAY READER) DEVI 1 each by Does not apply route See admin instructions. Use with sensors to monitor glucose levels; E11.9   Continuous Blood Gluc Sensor (FREESTYLE LIBRE 14 DAY SENSOR) MISC 1 Device by Does not apply route every 14  (fourteen) days.   Copper Gluconate POWD Take by mouth.   cyanocobalamin (,VITAMIN B-12,) 1000 MCG/ML injection SMARTSIG:1 Vial(s) IM Once a Month   cycloSPORINE (RESTASIS) 0.05 % ophthalmic emulsion Place 1 drop into both eyes 2 (two) times daily.   diclofenac sodium (VOLTAREN) 1 % GEL Apply 2 g topically 4 (four) times daily.   dicyclomine (BENTYL) 10 MG capsule Take 1-2 tab 3 times daily AC as needed for spasms and cramping.   Docusate Sodium (DSS) 100 MG CAPS Take by mouth.   DULoxetine (CYMBALTA) 30 MG capsule Take 30 mg by mouth 2 (two) times daily.   Erenumab-aooe 70 MG/ML SOAJ  Inject into the skin.   esomeprazole (NEXIUM) 40 MG capsule Take 1 capsule (40 mg total) by mouth daily before breakfast.   famotidine (PEPCID) 20 MG tablet Take by mouth.   fentaNYL (DURAGESIC) 25 MCG/HR 1 patch every 3 (three) days.   furosemide (LASIX) 40 MG tablet Take by mouth.   Galcanezumab-gnlm (EMGALITY) 120 MG/ML SOAJ Inject 120 mg into the skin every 28 (twenty-eight) days.   glucagon 1 MG injection Inject 1 mg into the muscle once as needed for up to 1 dose.   glucose blood (FREESTYLE TEST STRIPS) test strip Use to monitor glucose levels 4 times daily; E11.9   HYDROcodone-acetaminophen (NORCO) 10-325 MG tablet Take 1 tablet by mouth every 6 (six) hours as needed for moderate pain. 1/2 tablet as needed   hydrocortisone cream 1 % Apply topically.   hypromellose (GENTEAL) 0.3 % GEL ophthalmic ointment Place 1 drop into both eyes at bedtime. Reported on 02/11/2015   Lancets (FREESTYLE) lancets Use to monitor glucose levels 4 times daily; E11.9   latanoprost (XALATAN) 0.005 % ophthalmic solution Apply to eye.   lidocaine (LIDODERM) 5 % Place 1 patch onto the skin as needed. Remove & Discard patch within 12 hours. May dispense as 3 month supply   Lifitegrast 5 % SOLN Place 1 drop into both eyes daily.   metroNIDAZOLE (METROGEL) 1 % gel Apply topically daily.   mirabegron ER (MYRBETRIQ) 25 MG TB24 tablet  Take 1 tablet (25 mg total) by mouth daily.   Multiple Vitamins-Minerals (PRESERVISION AREDS) CAPS Take 1 capsule by mouth 2 (two) times daily.    naloxegol oxalate (MOVANTIK) 25 MG TABS tablet Take 25 mg by mouth daily.   Naloxone HCl 0.4 MG/0.4ML SOAJ Administer 0.65m Vance at first sign of opioid overdose and repeat every 2 minutes as needed for resuscitation. Call 911 immediately   NAda Peripheral Neuropathy Cream- Bupivacaine 1%, Doxepin 3%, Gabapentin 6%, Pentoxifylline 3%, Topiramate 1% Apply 1-2 grams to affected area 3-4 times daily Qty. 120 gm 3 refills   NON FORMULARY GABA6%LIDO5%DICL3%   nystatin-triamcinolone (MYCOLOG II) cream    ondansetron (ZOFRAN) 8 MG tablet Take 1 tablet (8 mg total) by mouth every 8 (eight) hours as needed for nausea or vomiting.   phenazopyridine (PYRIDIUM) 95 MG tablet Take by mouth.   potassium chloride SA (KLOR-CON M) 20 MEQ tablet Take by mouth.   pregabalin (LYRICA) 50 MG capsule Take 2 capsules (100 mg total) by mouth 2 (two) times daily.   Probiotic Product (PROBIOTIC-10 PO) Take by mouth.    sucralfate (CARAFATE) 1 GM/10ML suspension Take 10 mLs (1 g total) by mouth 4 (four) times daily.   SUMAtriptan (IMITREX) 100 MG tablet Take 1 tablet at earliest onset of headache, may repeat in 2 hours if headache persists or reoccurs.   traZODone (DESYREL) 100 MG tablet Take by mouth.   Vitamin D, Ergocalciferol, (DRISDOL) 1.25 MG (50000 UT) CAPS capsule Take 1 capsule (50,000 Units total) by mouth every 7 (seven) days.   No facility-administered encounter medications on file as of 05/31/2021.   Thank you for the opportunity to participate in the care of Kayla [name].  The palliative care team will continue to follow. Please call our office at [phone] we can be of additional assistance.   Hassie Mandt Z Dorris Pierre, NP ,

## 2021-05-31 NOTE — Telephone Encounter (Signed)
Per 5/17 los called and spoke to pt about 3 month and 6 month appointment.  Pt confirmed appointment

## 2021-06-04 ENCOUNTER — Telehealth: Payer: Self-pay

## 2021-06-04 NOTE — Telephone Encounter (Signed)
PC SW outreached patient to schedule in home PC visit, per [facility] NP C. Gusler request.  Visit scheduled with patient for 06/19/21 @ 1pm with PC RN/SW team.

## 2021-06-05 ENCOUNTER — Telehealth: Payer: Self-pay | Admitting: Internal Medicine

## 2021-06-05 DIAGNOSIS — N3941 Urge incontinence: Secondary | ICD-10-CM | POA: Diagnosis not present

## 2021-06-05 DIAGNOSIS — D508 Other iron deficiency anemias: Secondary | ICD-10-CM | POA: Diagnosis not present

## 2021-06-05 DIAGNOSIS — G43909 Migraine, unspecified, not intractable, without status migrainosus: Secondary | ICD-10-CM | POA: Diagnosis not present

## 2021-06-05 DIAGNOSIS — M797 Fibromyalgia: Secondary | ICD-10-CM | POA: Diagnosis not present

## 2021-06-05 DIAGNOSIS — K219 Gastro-esophageal reflux disease without esophagitis: Secondary | ICD-10-CM | POA: Diagnosis not present

## 2021-06-05 NOTE — Telephone Encounter (Signed)
Inbound call from patient stating she is possibly in need of  a TPN port and Is seeking advice on who would help her with that. Please advise.

## 2021-06-06 NOTE — Telephone Encounter (Signed)
Pt stated that she recently seen Dr. Lorenso Courier (Oncologist) and received and Iron Infusion and Copper Infusion). Pt stated that Dr. Lorenso Courier recommended that she receive a Port and receive TPN.  Pt stated that she is not getting any nourishment and is very Anemic:  Chart reviewed. Pt does have a history of Gastric Bypass: Pt stated that she recently was referred to [place] Pt is questioning could the TPN Port and TPN be initiated though our office: Please review and advise:

## 2021-06-07 ENCOUNTER — Telehealth: Payer: Self-pay | Admitting: Pharmacist

## 2021-06-07 ENCOUNTER — Other Ambulatory Visit (HOSPITAL_COMMUNITY): Payer: Self-pay

## 2021-06-07 NOTE — Telephone Encounter (Signed)
Patient's last dose of Prolia was on 01/26/21. She is due for next Prolia on 07/25/21.  Patient has Medicare + [employer] as secondary (effective as of 09/14/99 through 08/26/2067).  Medicare covers 80% of the infusion and no authorization is required, and [employer] would cover the 20% of the cost that was not paid for by Medicare as long as Medicare covered the medication after patient meets her $233 Medicare B deductible.  Through pharmacy benefit, the copay is $394.68.  Patient reports she is now taking Vitamin D once a month as directed by her PCP Dr. Ernie Hew - most recent Vitamin D level was on 40.1 on 03/26/21  Knox Saliva, PharmD, MPH, BCPS, CPP Clinical Pharmacist (Rheumatology and Pulmonology)

## 2021-06-07 NOTE — Telephone Encounter (Signed)
Pt made aware of Dr Carlean Purl recommendations. Pt verbalized understanding with all questions answers.

## 2021-06-07 NOTE — Telephone Encounter (Signed)
I do not provide that care for patients with her problem. She could see if her [facility] dietitian could suggest a provider there? I am not sure who would do that in [place] ([facility] ports but not sure who would manage TPN)

## 2021-06-12 ENCOUNTER — Inpatient Hospital Stay (HOSPITAL_COMMUNITY): Admission: RE | Admit: 2021-06-12 | Payer: Medicare Other | Source: Ambulatory Visit

## 2021-06-12 DIAGNOSIS — G629 Polyneuropathy, unspecified: Secondary | ICD-10-CM | POA: Diagnosis not present

## 2021-06-12 DIAGNOSIS — R1310 Dysphagia, unspecified: Secondary | ICD-10-CM | POA: Diagnosis not present

## 2021-06-12 DIAGNOSIS — K912 Postsurgical malabsorption, not elsewhere classified: Secondary | ICD-10-CM | POA: Diagnosis not present

## 2021-06-12 DIAGNOSIS — R634 Abnormal weight loss: Secondary | ICD-10-CM | POA: Diagnosis not present

## 2021-06-12 DIAGNOSIS — Z9884 Bariatric surgery status: Secondary | ICD-10-CM | POA: Diagnosis not present

## 2021-06-12 DIAGNOSIS — E559 Vitamin D deficiency, unspecified: Secondary | ICD-10-CM | POA: Diagnosis not present

## 2021-06-12 DIAGNOSIS — E538 Deficiency of other specified B group vitamins: Secondary | ICD-10-CM | POA: Diagnosis not present

## 2021-06-13 DIAGNOSIS — F4321 Adjustment disorder with depressed mood: Secondary | ICD-10-CM | POA: Diagnosis not present

## 2021-06-13 DIAGNOSIS — E119 Type 2 diabetes mellitus without complications: Secondary | ICD-10-CM | POA: Diagnosis not present

## 2021-06-13 DIAGNOSIS — D508 Other iron deficiency anemias: Secondary | ICD-10-CM | POA: Diagnosis not present

## 2021-06-13 DIAGNOSIS — M810 Age-related osteoporosis without current pathological fracture: Secondary | ICD-10-CM | POA: Diagnosis not present

## 2021-06-18 ENCOUNTER — Telehealth: Payer: Self-pay | Admitting: *Deleted

## 2021-06-18 ENCOUNTER — Telehealth: Payer: Self-pay

## 2021-06-18 NOTE — Telephone Encounter (Signed)
PC SW returned patient TC to rescheduled in home visit from 06/19/21 due to a schedule conflict on patient behalf with wound clinic.   Visit rescheduled to 6/12 '@1045'$  AM.

## 2021-06-18 NOTE — Telephone Encounter (Signed)
Received call from pt. Inguiring about getting TPN.  She is malnourished, s/p gastric bypass. She states her GI doctor does not manage this.  She has been referred to [facility] but pt would prefer to keep this local in [facility]  Dr. Lorenso Courier made aware of her request.  Call made to Carolynn Sayers, [place] @ [facility]-they provide the TPN solution/pharmacy etc, to ask her if she knew of providers that may be able to assist this patient.  Pam stated she would look into it and call me back.

## 2021-06-19 ENCOUNTER — Other Ambulatory Visit: Payer: Medicare Other

## 2021-06-19 ENCOUNTER — Ambulatory Visit: Payer: Medicare Other | Admitting: Psychology

## 2021-06-19 DIAGNOSIS — M545 Low back pain, unspecified: Secondary | ICD-10-CM | POA: Diagnosis not present

## 2021-06-19 DIAGNOSIS — G609 Hereditary and idiopathic neuropathy, unspecified: Secondary | ICD-10-CM | POA: Diagnosis not present

## 2021-06-19 DIAGNOSIS — G8929 Other chronic pain: Secondary | ICD-10-CM | POA: Diagnosis not present

## 2021-06-19 DIAGNOSIS — M542 Cervicalgia: Secondary | ICD-10-CM | POA: Diagnosis not present

## 2021-06-19 DIAGNOSIS — Z79899 Other long term (current) drug therapy: Secondary | ICD-10-CM | POA: Diagnosis not present

## 2021-06-21 DIAGNOSIS — N3941 Urge incontinence: Secondary | ICD-10-CM | POA: Diagnosis not present

## 2021-06-25 ENCOUNTER — Other Ambulatory Visit: Payer: Medicare Other

## 2021-06-25 VITALS — BP 112/74 | HR 95 | Temp 97.5°F

## 2021-06-25 DIAGNOSIS — Z515 Encounter for palliative care: Secondary | ICD-10-CM

## 2021-06-25 NOTE — Progress Notes (Signed)
PATIENT NAME: Kayla [name] DOB: January 09, 1946 MRN: [id number]  PRIMARY CARE PROVIDER: [name], MD  RESPONSIBLE PARTY:  Acct ID - Guarantor Home Phone Work Phone Relationship Acct Type  [id number] WALTERINE, [id number]  Self P/F     [address], [place], [place] [address]    PLAN OF CARE and INTERVENTIONS:               1.  GOALS OF CARE/ ADVANCE CARE PLANNING:  Desires full code status and would like to be placed on life support if needed but no longer than 3 months.                2.  PATIENT/CAREGIVER EDUCATION:  Palliative Care Services               4. PERSONAL EMERGENCY PLAN:  Activate 911 for emergencies.                5.  DISEASE STATUS:  Appetite:  Remains poor.  Currently seeking TPN as an option but no concrete plan at this time.   Hx of gastric bypass surgery that has complicated patient's current situation.   Debility:  Patient sustained a fall last week.  Abrasion to left elbow and some neck discomfort.  Patient advised this is her first fall in 22 years.  She is ambulatory does furniture surfing.  Also has electric wheelchair, cane, walker and chair lift. Patient is still able drive.  Working on obtaining a handicap placard for her car.   Insomnia: Chronic issues and has medications to aide in sleep.  Patient is not taking any sleeping aides due to her spouse's medical condition.  She reports medications are also to sedative.   Pain:  Mostly issues with peripheral neuropathy.  Patient continues with fentanyl at 25 mcg q 72 hours.  Attempted to decrease to 12.5 mcg but pain was to severe.  Requested to increase to 50 mcg but pain clinic has decided to keep current dosage but will revisit at a later date.    HISTORY OF PRESENT ILLNESS:   ZOSIA LUCCHESE is a 76 y.o. year old female  with multiple medical problems including h/o gastric bypass sgy with revision and altered anatomy with small stomach pouch secondary to Barrett esophagus, dysphagia,  IDA, anorexia, angina, degeneration of cervical, lumbosacral discs, cervical facet joint syndrome, fibromyalgia, chronic pain, chronic interstitial cystitis, ckd, nephrolithiasis, kidney stones, chronic maxillary sinusitis, gerd, h/o hiatal hernia, chronic migrane h/a, hyperparathyroidism, IBS, HTN, hypoglycemia, DM, heart murmur, mitral regurgitation, MVP, neuropathy, osteoporosis, pvd, PTSD, anxiety.  Patient is being followed by Palliative Care every 4-8 weeks and PRN.   CODE STATUS: Full Code ADVANCED DIRECTIVES: Yes MOST FORM: No PPS: 50%   PHYSICAL EXAM:   VITALS: Today's Vitals   06/25/21 1049  BP: 112/74  Pulse: 95  Temp: (!) 97.5 F (36.4 C)  SpO2: 99%    LUNGS: clear to auscultation  CARDIAC: Cor RRR}  EXTREMITIES: 1+ ankle/calf edema SKIN: Skin color, texture, turgor normal. No rashes or lesions or mobility and turgor normal  NEURO: positive for gait problems       Lorenza Burton, RN

## 2021-06-25 NOTE — Progress Notes (Signed)
COMMUNITY PALLIATIVE CARE SW NOTE  PATIENT NAME: Kayla [name] DOB: [birth year] MRN: [id number]  PRIMARY CARE PROVIDER: [name], MD  RESPONSIBLE PARTY:  Acct ID - Guarantor Home Phone Work Phone Relationship Acct Type  [id number] IVANELL, [phone]  Self P/F     [address], [place], [place] [address]     PLAN OF CARE and INTERVENTIONS:              GOALS OF CARE/ ADVANCE CARE PLANNING: Goals include to maximize quality of life and symptom management. Our advance care planning conversation included a discussion about:    The value and importance of advance care planning  Review and updating or creation of an advance directive document.  Code Status: Full code and wishes for full scope of treatment. Patients husband, Clair Gulling, is POA.   2.     Palliative care encounter: Palliative medicine team will continue to support patient, patient's family, and medical team. Visit consisted of counseling and education dealing with the complex and emotionally intense issues of symptom management and palliative care in the setting of serious and potentially life-threatening illness.  SW completed visit with PC RN J. Owens Shark, met with patient.  Functional changes/updates: Patient has not any functional changes or decline since previous PC visit. Patient shares that her main concern is her diet and eating. She is in the process of trying to get connected to a surgeon that will do the TPN surgery. Patient reports a fall last week coming into the house. No injuries reported.  Patient suffers from debility due to chronic pain syndrome, iron deficiency and polyneuropathy.    Home & Environment assessment: No concerns. Patient feels safe in her home environment. Patient's home is two level. Patient is able to maneuver around her home without issue. She does a lot of furniture surfing. Patient has chair/stair lift, WC, RW, SPC and shower chair with grab bars. Patient inquired about a  bedside table. SW will look into agencies with donations.   Protein calorie malnutrition/weight loss/Appetite: Patient's appetite is poor. weight loss concerns noted.   Psychosocial assessment: completed.  Home support: Patients spouse assist with needs. Patients daughter lives behind her. Patient is being followed by [employer]: patient drives. SW provided patient with the following transportation resources if needed:  [facility]  [address] [place], [place] [address]  [phone]  **if non Medicaid, have to complete application online and fax back for review and approval, prior to scheduling a rideThe [employer]  [phone]    **Have to complete a registration/waiver form that can only be mailed to patient and mailed back to senior wheels**  The [employer] provides rides for seniors age 74 and over who are ambulatory to medical appointments in [facility] and to regional medical facilities.   Food: No needs.  Ongoing support/resources will continued to be offered if needed.   Military hx: paitents spouse is a English as a second language teacher and is [employer] connected.   Medical assessment: RN reviewed medications and took vitals.   Hospice discussion: N/A  SW discussed goals, reviewed care plan, provided emotional support, used active and reflective listening in the form of reciprocity emotional response. Questions and concerns were addressed. The patient/family was encouraged to call with any additional questions and/or concerns. PC Provided general support and encouragement, no other unmet needs identified. Will continue to follow.  3.  PATIENT/CAREGIVER EDUCATION/ COPING:   Appearance: well groomed, appropriate given situation  Mental Status: alert and oriented  Eye Contact: good Thought Process: rational  Thought Content: good  Speech: Normal rate,  volume, tone  Mood: Normal and calm Affect: Congruent to endorsed mood, full ranging Insight: good Judgement: good Interaction Style: Cooperative  Patient A&O x4. Patient actively engaged in conversation and able to answer all questions. Patient shares that she suffers from PTSD, anxiety and depression. She shares that she will be stating back in person therapy tomorrow.    Patient does cross words, crochet, and other sedentary activities to keep her busy. Patient is a member of  [employer] in [place], of which she continues to be active in although she has not attended in sometime.   Patient has a large family, majority live locally and are supportive and provides physical and emotional support to patient.   4.         PERSONAL EMERGENCY PLAN:  Patient will call 9-1-1 for emergencies.    5.         COMMUNITY RESOURCES COORDINATION/ HEALTH CARE NAVIGATION:  Patient manages her care.  6.         FINANCIAL CONCERNS/NEEDS: None.                        Primary Health Insurance: Medicare Secondary Health Insurance: [facility] Prescription Coverage: Yes, no history of difficulty obtaining or affording prescriptions reported     SOCIAL HX:  Social History   Tobacco Use   Smoking status: Never    Passive exposure: Past   Smokeless tobacco: Never  Substance Use Topics   Alcohol use: No    CODE STATUS: FULL CODE  ADVANCED DIRECTIVES: Y MOST FORM COMPLETE:  N HOSPICE EDUCATION PROVIDED: N  PPS: Patient is INDP with all ADL's at this time. Patient is A&O  with good insight and judgement.   Time spent: 1 hr     [place], [place]

## 2021-06-26 ENCOUNTER — Ambulatory Visit (INDEPENDENT_AMBULATORY_CARE_PROVIDER_SITE_OTHER): Payer: Medicare Other | Admitting: Psychology

## 2021-06-26 ENCOUNTER — Other Ambulatory Visit: Payer: Medicare Other

## 2021-06-26 DIAGNOSIS — F411 Generalized anxiety disorder: Secondary | ICD-10-CM
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
## 2021-06-28 ENCOUNTER — Ambulatory Visit (HOSPITAL_COMMUNITY)
Admission: RE | Admit: 2021-06-28 | Discharge: 2021-06-28 | Disposition: A | Discharge status: Home / Self Care | Payer: Medicare Other | Source: Ambulatory Visit | Attending: Internal Medicine | Admitting: Internal Medicine

## 2021-06-28 DIAGNOSIS — R1319 Other dysphagia: Secondary | ICD-10-CM

## 2021-06-28 DIAGNOSIS — K224 Dyskinesia of esophagus: Secondary | ICD-10-CM | POA: Diagnosis not present

## 2021-06-28 DIAGNOSIS — Z9884 Bariatric surgery status: Secondary | ICD-10-CM

## 2021-06-28 DIAGNOSIS — R1013 Epigastric pain: Secondary | ICD-10-CM

## 2021-06-28 DIAGNOSIS — R1310 Dysphagia, unspecified: Secondary | ICD-10-CM | POA: Diagnosis not present

## 2021-07-03 ENCOUNTER — Ambulatory Visit: Payer: Medicare Other | Admitting: Psychology

## 2021-07-10 ENCOUNTER — Ambulatory Visit: Payer: Medicare Other | Admitting: Psychology

## 2021-07-11 DIAGNOSIS — E119 Type 2 diabetes mellitus without complications: Secondary | ICD-10-CM | POA: Diagnosis not present

## 2021-07-11 DIAGNOSIS — F4321 Adjustment disorder with depressed mood: Secondary | ICD-10-CM | POA: Diagnosis not present

## 2021-07-11 DIAGNOSIS — D508 Other iron deficiency anemias: Secondary | ICD-10-CM | POA: Diagnosis not present

## 2021-07-11 DIAGNOSIS — M810 Age-related osteoporosis without current pathological fracture: Secondary | ICD-10-CM | POA: Diagnosis not present

## 2021-07-12 NOTE — Telephone Encounter (Signed)
MyChart message sent to patient for Prolia labwork. She had stated she would have completed yesterday but was not. Will only need CBC and CMP drawn  Knox Saliva, PharmD, MPH, BCPS, CPP Clinical Pharmacist (Rheumatology and Pulmonology)

## 2021-07-13 ENCOUNTER — Other Ambulatory Visit (HOSPITAL_COMMUNITY): Payer: Medicare Other

## 2021-07-14 DIAGNOSIS — N3000 Acute cystitis without hematuria: Secondary | ICD-10-CM | POA: Diagnosis not present

## 2021-07-19 ENCOUNTER — Other Ambulatory Visit: Payer: Self-pay | Admitting: *Deleted

## 2021-07-19 DIAGNOSIS — Z79899 Other long term (current) drug therapy: Secondary | ICD-10-CM

## 2021-07-19 DIAGNOSIS — M810 Age-related osteoporosis without current pathological fracture: Secondary | ICD-10-CM | POA: Diagnosis not present

## 2021-07-19 NOTE — Telephone Encounter (Signed)
Patient returned call stating she will stop by for Prolia labs this afternoon. Will only need CBC and CMP drawn since Vitamin D wnl on 06/12/21  Knox Saliva, PharmD, MPH, BCPS, CPP Clinical Pharmacist (Rheumatology and Pulmonology)

## 2021-07-19 NOTE — Telephone Encounter (Signed)
ATC patient regarding Prolia labs that need to be completed. Unable to reach patient. Left detailed VM  Knox Saliva, PharmD, MPH, BCPS, CPP Clinical Pharmacist (Rheumatology and Pulmonology)

## 2021-07-20 ENCOUNTER — Other Ambulatory Visit: Payer: Self-pay | Admitting: Pharmacist

## 2021-07-20 DIAGNOSIS — M810 Age-related osteoporosis without current pathological fracture: Secondary | ICD-10-CM

## 2021-07-20 LAB — COMPREHENSIVE METABOLIC PANEL
AG Ratio: 2.1 (calc) (ref 1.0–2.5)
ALT: 29 U/L (ref 6–29)
AST: 40 U/L — ABNORMAL HIGH (ref 10–35)
Albumin: 3.4 g/dL — ABNORMAL LOW (ref 3.6–5.1)
Alkaline phosphatase (APISO): 40 U/L (ref 37–153)
BUN: 23 mg/dL (ref 7–25)
CO2: 30 mmol/L (ref 20–32)
Calcium: 9.4 mg/dL (ref 8.6–10.4)
Chloride: 105 mmol/L (ref 98–110)
Creat: 0.72 mg/dL (ref 0.60–1.00)
Globulin: 1.6 g/dL (calc) — ABNORMAL LOW (ref 1.9–3.7)
Glucose, Bld: 180 mg/dL — ABNORMAL HIGH (ref 65–99)
Potassium: 5 mmol/L (ref 3.5–5.3)
Sodium: 140 mmol/L (ref 135–146)
Total Bilirubin: 0.3 mg/dL (ref 0.2–1.2)
Total Protein: 5 g/dL — ABNORMAL LOW (ref 6.1–8.1)

## 2021-07-20 LAB — CBC WITH DIFFERENTIAL/PLATELET
Absolute Monocytes: 442 cells/uL (ref 200–950)
Basophils Absolute: 63 cells/uL (ref 0–200)
Basophils Relative: 0.8 %
Eosinophils Absolute: 458 cells/uL (ref 15–500)
Eosinophils Relative: 5.8 %
HCT: 36.6 % (ref 35.0–45.0)
Hemoglobin: 12.2 g/dL (ref 11.7–15.5)
Lymphs Abs: 2560 cells/uL (ref 850–3900)
MCH: 32.5 pg (ref 27.0–33.0)
MCHC: 33.3 g/dL (ref 32.0–36.0)
MCV: 97.6 fL (ref 80.0–100.0)
MPV: 9.5 fL (ref 7.5–12.5)
Monocytes Relative: 5.6 %
Neutro Abs: 4377 cells/uL (ref 1500–7800)
Neutrophils Relative %: 55.4 %
Platelets: 245 10*3/uL (ref 140–400)
RBC: 3.75 10*6/uL — ABNORMAL LOW (ref 3.80–5.10)
RDW: 12.3 % (ref 11.0–15.0)
Total Lymphocyte: 32.4 %
WBC: 7.9 10*3/uL (ref 3.8–10.8)

## 2021-07-20 NOTE — Progress Notes (Signed)
Next dose not yet scheduled for Prolia SQ. Patient due on or after 07/25/21.  Dose: '60mg'$  SQ every 6 months  Last Clinic Visit: 03/08/21 Next Clinic Visit: 09/05/21  Last dose: 01/26/21  Labs: RBC count is borderline low.  Rest of CBC WNL.  Glucose is elevated-180.  Total protein is low including globulin and albumin.   AST is borderline elevated-40. ALT WNL.    Vitamin D wnl on 06/12/21  Orders placed for Prolia SQ x 1 dose. No premeds required.   Sent MyChart message to patient and provided with phone number for: [facility] [phone]) Lady Gary  Will follow-up to ensured scheduled and completed  Knox Saliva, PharmD, MPH, BCPS, CPP Clinical Pharmacist (Rheumatology and Pulmonology)

## 2021-07-20 NOTE — Progress Notes (Signed)
RBC count is borderline low.  Rest of CBC WNL.  Glucose is elevated-180.  Total protein is low including globulin and albumin.   AST is borderline elevated-40. ALT WNL.    Please forward results to PCP.

## 2021-07-25 DIAGNOSIS — E43 Unspecified severe protein-calorie malnutrition: Secondary | ICD-10-CM | POA: Diagnosis not present

## 2021-07-25 DIAGNOSIS — G43109 Migraine with aura, not intractable, without status migrainosus: Secondary | ICD-10-CM | POA: Diagnosis not present

## 2021-07-25 DIAGNOSIS — N3946 Mixed incontinence: Secondary | ICD-10-CM | POA: Diagnosis not present

## 2021-07-25 DIAGNOSIS — I5022 Chronic systolic (congestive) heart failure: Secondary | ICD-10-CM | POA: Diagnosis not present

## 2021-07-25 DIAGNOSIS — F419 Anxiety disorder, unspecified: Secondary | ICD-10-CM | POA: Diagnosis not present

## 2021-07-25 NOTE — Progress Notes (Signed)
Prolia scheduled for 07/26/21. Will f/u to ensure completed  Knox Saliva, PharmD, MPH, BCPS, CPP Clinical Pharmacist (Rheumatology and Pulmonology)

## 2021-07-26 ENCOUNTER — Inpatient Hospital Stay (HOSPITAL_COMMUNITY): Admission: RE | Admit: 2021-07-26 | Payer: Medicare Other | Source: Ambulatory Visit

## 2021-07-27 DIAGNOSIS — M797 Fibromyalgia: Secondary | ICD-10-CM | POA: Diagnosis not present

## 2021-07-27 DIAGNOSIS — G43109 Migraine with aura, not intractable, without status migrainosus: Secondary | ICD-10-CM | POA: Diagnosis not present

## 2021-07-27 DIAGNOSIS — N390 Urinary tract infection, site not specified: Secondary | ICD-10-CM | POA: Diagnosis not present

## 2021-07-27 DIAGNOSIS — I5022 Chronic systolic (congestive) heart failure: Secondary | ICD-10-CM | POA: Diagnosis not present

## 2021-07-27 DIAGNOSIS — F419 Anxiety disorder, unspecified: Secondary | ICD-10-CM | POA: Diagnosis not present

## 2021-07-27 NOTE — Progress Notes (Signed)
Prolia not completed on 07/26/21. MyChart message sent to patient to request she r/s  Knox Saliva, PharmD, MPH, BCPS, CPP Clinical Pharmacist (Rheumatology and Pulmonology)

## 2021-07-30 DIAGNOSIS — M545 Low back pain, unspecified: Secondary | ICD-10-CM | POA: Diagnosis not present

## 2021-07-30 DIAGNOSIS — M542 Cervicalgia: Secondary | ICD-10-CM | POA: Diagnosis not present

## 2021-07-30 DIAGNOSIS — G609 Hereditary and idiopathic neuropathy, unspecified: Secondary | ICD-10-CM | POA: Diagnosis not present

## 2021-07-30 DIAGNOSIS — Z79899 Other long term (current) drug therapy: Secondary | ICD-10-CM | POA: Diagnosis not present

## 2021-07-30 DIAGNOSIS — F32A Depression, unspecified: Secondary | ICD-10-CM | POA: Diagnosis not present

## 2021-07-30 DIAGNOSIS — F419 Anxiety disorder, unspecified: Secondary | ICD-10-CM | POA: Diagnosis not present

## 2021-07-30 DIAGNOSIS — G8929 Other chronic pain: Secondary | ICD-10-CM | POA: Diagnosis not present

## 2021-07-31 DIAGNOSIS — E161 Other hypoglycemia: Secondary | ICD-10-CM | POA: Diagnosis not present

## 2021-07-31 DIAGNOSIS — K912 Postsurgical malabsorption, not elsewhere classified: Secondary | ICD-10-CM | POA: Diagnosis not present

## 2021-07-31 DIAGNOSIS — R1319 Other dysphagia: Secondary | ICD-10-CM | POA: Diagnosis not present

## 2021-07-31 DIAGNOSIS — Z79899 Other long term (current) drug therapy: Secondary | ICD-10-CM | POA: Diagnosis not present

## 2021-07-31 DIAGNOSIS — Z9884 Bariatric surgery status: Secondary | ICD-10-CM | POA: Diagnosis not present

## 2021-07-31 DIAGNOSIS — R634 Abnormal weight loss: Secondary | ICD-10-CM | POA: Diagnosis not present

## 2021-07-31 NOTE — Progress Notes (Signed)
Prolia r/s for 08/03/21. Will f/u to ensure completed  Knox Saliva, PharmD, MPH, BCPS, CPP Clinical Pharmacist (Rheumatology and Pulmonology)

## 2021-08-01 DIAGNOSIS — Z79899 Other long term (current) drug therapy: Secondary | ICD-10-CM | POA: Diagnosis not present

## 2021-08-03 ENCOUNTER — Ambulatory Visit (HOSPITAL_COMMUNITY)
Admission: RE | Admit: 2021-08-03 | Discharge: 2021-08-03 | Disposition: A | Discharge status: Home / Self Care | Payer: Medicare Other | Source: Ambulatory Visit | Attending: Rheumatology | Admitting: Rheumatology

## 2021-08-03 DIAGNOSIS — M810 Age-related osteoporosis without current pathological fracture: Secondary | ICD-10-CM | POA: Diagnosis not present

## 2021-08-03 MED ORDER — DENOSUMAB 60 MG/ML ~~LOC~~ SOSY
PREFILLED_SYRINGE | SUBCUTANEOUS | Status: AC
  Filled 2021-08-03: qty 1

## 2021-08-03 MED ORDER — DENOSUMAB 60 MG/ML ~~LOC~~ SOSY
60.0000 mg | PREFILLED_SYRINGE | Freq: Once | SUBCUTANEOUS | Status: AC
  Administered 2021-08-03: 60 mg via SUBCUTANEOUS

## 2021-08-06 ENCOUNTER — Ambulatory Visit: Payer: Medicare Other | Admitting: Psychology

## 2021-08-10 DIAGNOSIS — E119 Type 2 diabetes mellitus without complications: Secondary | ICD-10-CM | POA: Diagnosis not present

## 2021-08-10 DIAGNOSIS — I5022 Chronic systolic (congestive) heart failure: Secondary | ICD-10-CM | POA: Diagnosis not present

## 2021-08-10 DIAGNOSIS — M810 Age-related osteoporosis without current pathological fracture: Secondary | ICD-10-CM | POA: Diagnosis not present

## 2021-08-10 DIAGNOSIS — F4321 Adjustment disorder with depressed mood: Secondary | ICD-10-CM | POA: Diagnosis not present

## 2021-08-10 DIAGNOSIS — M797 Fibromyalgia: Secondary | ICD-10-CM | POA: Diagnosis not present

## 2021-08-10 DIAGNOSIS — D508 Other iron deficiency anemias: Secondary | ICD-10-CM | POA: Diagnosis not present

## 2021-08-21 DIAGNOSIS — Z9884 Bariatric surgery status: Secondary | ICD-10-CM | POA: Diagnosis not present

## 2021-08-21 DIAGNOSIS — K912 Postsurgical malabsorption, not elsewhere classified: Secondary | ICD-10-CM | POA: Diagnosis not present

## 2021-08-21 DIAGNOSIS — R634 Abnormal weight loss: Secondary | ICD-10-CM | POA: Diagnosis not present

## 2021-08-21 DIAGNOSIS — K449 Diaphragmatic hernia without obstruction or gangrene: Secondary | ICD-10-CM | POA: Diagnosis not present

## 2021-08-21 DIAGNOSIS — K224 Dyskinesia of esophagus: Secondary | ICD-10-CM | POA: Diagnosis not present

## 2021-08-21 DIAGNOSIS — E161 Other hypoglycemia: Secondary | ICD-10-CM | POA: Diagnosis not present

## 2021-08-21 DIAGNOSIS — E162 Hypoglycemia, unspecified: Secondary | ICD-10-CM | POA: Diagnosis not present

## 2021-08-23 DIAGNOSIS — Z79899 Other long term (current) drug therapy: Secondary | ICD-10-CM | POA: Diagnosis not present

## 2021-08-23 DIAGNOSIS — E1142 Type 2 diabetes mellitus with diabetic polyneuropathy: Secondary | ICD-10-CM | POA: Diagnosis not present

## 2021-08-23 DIAGNOSIS — Z5181 Encounter for therapeutic drug level monitoring: Secondary | ICD-10-CM | POA: Diagnosis not present

## 2021-08-23 NOTE — Progress Notes (Unsigned)
Office Visit Note  Patient: Kayla [name]             Date of Birth: 03-19-1945           MRN: [id number]             PCP: [name], MD Referring: [name], MD Visit Date: 09/05/2021 Occupation: '@GUAROCC'$ @  Subjective:  Chronic pain  History of Present Illness: Kayla [name] is a 76 y.o. female with history of osteoporosis and DDD.  Patient remains on Prolia 60 mg sq injections every 6 months.  Her most recent Prolia injection was administered on 08/03/2021.  She is tolerating Prolia without any side effects.  She has been taking vitamin D 50,000 units once monthly.  She has difficulty swallowing calcium supplements due to dysmotility and dysphagia.  She remains under the care of gastroenterology and is considering having gastric bypass reversal due to ongoing malabsorption. Patient reports that she remains under the care of pain management.  She has chronic pain in all of her joints as well as myofascial pain secondary to fibromyalgia.  She has been experiencing increased pain in her neck and has ongoing discomfort in both lower extremities due to peripheral neuropathy.  According to the patient she will be starting a new treatment at pain management to help to alleviate her peripheral neuropathy in her feet.  She has been using a rollator walker or wheelchair to assist with ambulation due to the severity of her symptoms.  She denies any joint swelling currently.  Patient has discontinued meloxicam but would like to restart on Celebrex which has been helpful in alleviating her joint pain in the past.  She plans on having palliative care refill her Celebrex going forward.    Activities of Daily Living:  Patient reports morning stiffness for all day. Patient Reports nocturnal pain.  Difficulty dressing/grooming: Denies Difficulty climbing stairs: Reports Difficulty getting out of chair: Reports Difficulty using hands for taps, buttons, cutlery, and/or writing:  Reports  Review of Systems  Constitutional:  Positive for fatigue.  HENT:  Positive for mouth dryness. Negative for mouth sores.   Eyes:  Positive for dryness.  Respiratory:  Negative for shortness of breath.   Cardiovascular:  Negative for chest pain and palpitations.  Gastrointestinal:  Positive for constipation. Negative for blood in stool and diarrhea.  Endocrine: Negative for increased urination.  Genitourinary:  Positive for involuntary urination.  Musculoskeletal:  Positive for joint pain, gait problem, joint pain, myalgias, muscle weakness, morning stiffness, muscle tenderness and myalgias. Negative for joint swelling.  Skin:  Positive for color change. Negative for rash, hair loss and sensitivity to sunlight.  Allergic/Immunologic: Positive for susceptible to infections.  Neurological:  Positive for dizziness and headaches.  Hematological:  Negative for swollen glands.  Psychiatric/Behavioral:  Positive for depressed mood and sleep disturbance. The patient is nervous/anxious.     PMFS History:  Patient Active Problem List   Diagnosis Date Noted   Iron deficiency anemia due to dietary causes 04/18/2021   Peripheral neuropathy 04/03/2021   Chronic kidney disease 03/22/2021   Macular degeneration 03/22/2021   PTSD (post-traumatic stress disorder)    Epigastric pain 11/11/2019   Anorexia nervosa 11/11/2019   LLQ abdominal pain 11/11/2019   Malnutrition 11/11/2019   Mechanical breakdown implanted electronic neurostimulator spinal cord 11/16/2018   Esophageal dysphagia 10/03/2018   Neuropathic pain 01/23/2018   MVP (mitral valve prolapse) 12/17/2017   Rosacea 11/02/2017   Small intestinal bacterial overgrowth 08/05/2017  Orthostatic hypotension 11/20/2016   Type II diabetes mellitus 11/18/2016   Hypoglycemia 11/08/2016   Cervical facet joint syndrome 10/08/2016   Anal fissure 10/08/2016   Peripheral vascular disease 01/22/2016   Hypertension 01/22/2016   History of  hyperparathyroidism 01/22/2016   Mitral regurgitation 12/11/2015   History of gastric bypass 02/11/2014   Chronic maxillary sinusitis 09/15/2013   Personal history of colonic polyps 07/22/2013   HA (headache)-chronic/tension type 07/28/2012   Osteoporosis 07/28/2012   Nephrolithiasis 07/28/2012   Fibromyalgia 07/26/2012   Chronic interstitial cystitis 10/16/2011   Chronic constipation 10/04/2010   Loss of weight 11/27/2009   Generalized anxiety disorder 03/18/2007   Adjustment disorder with depressed mood 03/18/2007   GERD (gastroesophageal reflux disease) 03/18/2007   Diaphragmatic hernia 03/18/2007   Osteoarthritis 03/18/2007   Degeneration of cervical intervertebral disc 03/18/2007   Degeneration of lumbar or lumbosacral intervertebral disc 03/18/2007   Myalgia 03/18/2007    Past Medical History:  Diagnosis Date   Adjustment disorder with depressed mood 03/18/2007   Allergy    Anal fissure 10/08/2016   Angina    takes Propanolol prn and it stops her chest   Anorexia nervosa 11/11/2019   Barrett esophagus    not consistently present   Cataract    removed  but had retinal detachment - repeat cataract right eye   Cervical facet joint syndrome 10/08/2016   Chronic constipation 10/04/2010   Chronic interstitial cystitis 10/16/2011   Chronic kidney disease    kidney stones and UTI   Chronic maxillary sinusitis 09/15/2013   Chronic narcotic dependence 52/84/1324   Complication of anesthesia    either  BP or pulse dropped with last surgery   Degeneration of cervical intervertebral disc 03/18/2007   Degeneration of lumbar or lumbosacral intervertebral disc 03/18/2007   Diaphragmatic hernia 03/18/2007   Dysrhythmia    brought on by stress   Epigastric pain 11/11/2019   Esophageal dysphagia 10/03/2018   External hemorrhoids    Fibromyalgia    neuropathy knees ankles and toes, bladder   Generalized anxiety disorder 03/18/2007   GERD (gastroesophageal reflux disease)  03/18/2007   HA (headache)-chronic/tension type 07/28/2012   Heart murmur    Hiatal hernia    History of anemia    History of gastric bypass 02/11/2014   1999 at [facility]   History of hyperparathyroidism 01/22/2016   History of kidney stones    Hypertension    Hypoglycemia 11/08/2016   IBS (irritable bowel syndrome)    Internal hemorrhoids    LLQ abdominal pain 11/11/2019   Macular degeneration    Malnutrition 11/11/2019   Mechanical breakdown implanted electronic neurostimulator spinal cord 11/16/2018   Mitral regurgitation 12/11/2015   MVP (mitral valve prolapse) 12/17/2017   Myalgia 03/18/2007   Nephrolithiasis 07/28/2012   lithotrip 2013-Eskridge,MD   Neuropathic pain 01/23/2018   Orthostatic hypotension 11/20/2016   Osteoarthritis    all over   Osteopetrosis    Osteoporosis 07/28/2012   Peripheral vascular disease    superficial phlebitis in left calf   Personal history of colonic polyps 07/22/2013   07/2013 - 3 small adenomas - repeat colonoscopy 2018   PTSD (post-traumatic stress disorder)    Rosacea 11/02/2017   Small intestinal bacterial overgrowth 08/05/2017   + lactulose breath test 06/2017 Xifaxan Rx   Type II diabetes mellitus 11/18/2016    Family History  Problem Relation Age of Onset   Stroke Mother    Lung cancer Mother    Heart disease Mother  Diabetes Mother    Hypertension Father    Stroke Father    Heart disease Father    Kidney disease Father    Seizures Father        epilepsy, and sisters x 2   Memory loss Father    Lung cancer Sister    Breast cancer Sister    Cancer - Lung Sister    Heart disease Sister    Diabetes Sister    Pancreatic cancer Sister    Healthy Daughter    Healthy Son    Colon cancer Maternal Aunt        12 relatives   Colon cancer Paternal Aunt    Colon cancer Paternal Aunt    Uterine cancer Other        aunt   Heart disease Other        grandmother   Esophageal cancer Neg Hx    Rectal cancer Neg Hx     Stomach cancer Neg Hx    Colon polyps Neg Hx    Past Surgical History:  Procedure Laterality Date   ABDOMINAL HYSTERECTOMY     APPENDECTOMY     CARDIAC CATHETERIZATION  03/22/1991   EF 61%   CARDIOVASCULAR STRESS TEST  10/03/2006   CHOLECYSTECTOMY     COLONOSCOPY  06/23/2008   normal   DILATION AND CURETTAGE OF UTERUS     x2   ESOPHAGUS SURGERY     EXPLORATORY LAPAROTOMY  1993   EYE SURGERY     GASTRIC BYPASS  1999   GASTRIC RESTRICTION SURGERY  1991   LASIK Bilateral    Right Arm Surgery     Right Knee Arthroscopy     Rt wrist fx  2009   spinal surgery battery implant     stomach stappeling  1991   STONE EXTRACTION WITH BASKET  2012   TONSILLECTOMY     TUBAL LIGATION     UPPER GASTROINTESTINAL ENDOSCOPY  06/23/2008   w/Dil, Barrett's esophagus   US ECHOCARDIOGRAPHY  01/21/2007   EF 55-60%   US ECHOCARDIOGRAPHY  08/30/2003   EF 55-60%   Social History   Social History Narrative   Right handed   Two story home   She retired on disability   Married   2 Children   Immunization History  Administered Date(s) Administered   Hepatitis B, adult 04/21/2017   Influenza Split 10/02/2011   Influenza Whole 08/21/2007   Influenza, High Dose Seasonal PF 12/29/2017   Influenza,inj,Quad PF,6+ Mos 12/15/2013, 11/23/2014, 09/27/2015, 10/04/2016   Influenza-Unspecified 12/15/2013, 11/23/2014, 09/27/2015, 10/04/2016   PFIZER(Purple Top)SARS-COV-2 Vaccination 03/31/2019, 04/20/2019   Pneumococcal Conjugate-13 09/05/2014   Pneumococcal Polysaccharide-23 10/02/2011, 02/08/2013   Td 01/14/2006   Tdap 01/22/2016     Objective: Vital Signs: BP 127/75 (BP Location: Left Arm, Patient Position: Sitting, Cuff Size: Normal)   Pulse (!) 101   Ht '5\' 5"'$  (1.651 m)   Wt 103 lb 3.2 oz (46.8 kg)   BMI 17.17 kg/m    Physical Exam Vitals and nursing note reviewed.  Constitutional:      Appearance: She is well-developed.  HENT:     Head: Normocephalic and atraumatic.  Eyes:      Conjunctiva/sclera: Conjunctivae normal.  Cardiovascular:     Rate and Rhythm: Normal rate and regular rhythm.     Heart sounds: Normal heart sounds.  Pulmonary:     Effort: Pulmonary effort is normal.     Breath sounds: Normal breath sounds.  Abdominal:  General: Bowel sounds are normal.     Palpations: Abdomen is soft.  Musculoskeletal:     Cervical back: Normal range of motion.  Skin:    General: Skin is warm and dry.     Capillary Refill: Capillary refill takes less than 2 seconds.  Neurological:     Mental Status: She is alert and oriented to person, place, and time.  Psychiatric:        Behavior: Behavior normal.      Musculoskeletal Exam: Generalized hyperalgesia and positive tender points.  C-spine has limited range of motion with discomfort.  Patient remained seated during the examination today.  Shoulder joints have painful range of motion with tenderness bilaterally.  Trapezius muscle tension and tenderness bilaterally.  Elbow joints have good range of motion with no tenderness or synovitis.  PIP and DIP thickening consistent with osteoarthritis of both hands.  Complete fist formation bilaterally.  No tenderness or synovitis of MCP joints.  Hip joints difficult to assess in seated position.  Knee joints have good range of motion with discomfort but no warmth or effusion.  CDAI Exam: CDAI Score: -- Patient Global: --; Provider Global: -- Swollen: --; Tender: -- Joint Exam 09/05/2021   No joint exam has been documented for this visit   There is currently no information documented on the homunculus. Go to the Rheumatology activity and complete the homunculus joint exam.  Investigation: No additional findings.  Imaging: No results found.  Recent Labs: Lab Results  Component Value Date   WBC 5.9 09/03/2021   HGB 12.2 09/03/2021   PLT 240 09/03/2021   NA 140 09/03/2021   K 4.5 09/03/2021   CL 105 09/03/2021   CO2 32 09/03/2021   GLUCOSE 97 09/03/2021   BUN  23 09/03/2021   CREATININE 0.60 09/03/2021   BILITOT 0.3 09/03/2021   ALKPHOS 42 09/03/2021   AST 26 09/03/2021   ALT 19 09/03/2021   PROT 5.0 (L) 09/03/2021   ALBUMIN 3.3 (L) 09/03/2021   CALCIUM 9.1 09/03/2021   GFRAA 95 05/05/2020    Speciality Comments: Fosamax 2013-2018 Patient declined Tymlos and Forteo-travels frequently. Prolia May 26, 2020, January 26, 2021  Procedures:  No procedures performed Allergies: Ambien [zolpidem tartrate], Codeine, Oxycodone-acetaminophen, Tylox [oxycodone-acetaminophen], Darvocet [propoxyphene n-acetaminophen], Darvon [propoxyphene], Lorcet [hydrocodone-acetaminophen], Opium, Vicodin [hydrocodone-acetaminophen], Wheat bran, Amoxicillin, and Penicillins    Assessment / Plan:     Visit Diagnoses: Age-related osteoporosis without current pathological fracture - DEXA on 01/11/20: BMD measured at AP spine L1-L4 is 0.723 with a T-score of -3.8. Current therapy: Prolia injections: 01/26/2021,05/26/20, 01/26/2021, 08/03/2021.  She continues to tolerate prolia without any side effects or injection site reactions.  She is taking vitamin D 50,000 units once monthly.  She has difficulty taking a calcium supplement due to dysmotility and dysphagia.  Discussed trying chewable calcium or obtaining calcium through her diet. Discussed that her vitamin D was low at 20 on 07/31/2021.  Recommend increasing the dose of vitamin D to 50,000 units once weekly. She has not had any recent falls or fractures.  She has been using a rollator walker or wheelchair to assist with ambulation due to the severity of pain from peripheral neuropathy in both lower extremities. Patient is due to update bone density in December 2023.  She plans on having palliative care placed the order for the bone density.  She will follow up in 6 months.   Medication management: Due to update DEXA in December 2023.  Vitamin D was low at  20 on 07/31/2021.  CBC and CMP were updated on 09/03/2021.  Vitamin D  deficiency: Vitamin D was low at 20 on 07/31/2021.  Patient was advised to increase vitamin D from 50,000 once monthly to 50,000 once weekly.   DDD (degenerative disc disease), cervical: C-spine has limited range of motion without rotation.  She has been experiencing increased neck pain and stiffness.  She has trapezius muscle tension and tenderness bilaterally.  Followed by pain management.  Trapezius muscle spasm: She has trapezius muscle tension and tenderness bilaterally.    DDD (degenerative disc disease), lumbar: Chronic pain.  Followed by pain management.  Trochanteric bursitis of both hips: Chronic pain.    Chronic pain syndrome - She uses fentanyl patches, hydrocodone, and Celebrex for pain relief.  She is also taking Lyrica as prescribed.  Fibromyalgia: She has generalized hyperalgesia and positive tender points on examination.  She tried home PT but did not notice any improvement in her symptoms.  She is currently under palliative care.  She will have difficulty forming formal physical therapy due to being essentially nonweightbearing secondary to peripheral neuropathy.  She continues to follow-up with pain management.  She remains on Lyrica and Cymbalta as prescribed.  She has been taking Norco as needed for pain relief.  She is no longer taking meloxicam and plans on having palliative care refill her Celebrex going forward.  Neuropathic pain: Patient has symptoms of radiculopathy in bilateral lower extremities.  She is under the care of pain management and will be trying a new upcoming treatment.  She has been using a rollator walker or a wheelchair to assist with ambulation due to the severity of pain in both feet.  Muscle cramps  Other fatigue: Chronic.   Other medical conditions are listed as follows:   Hypoglycemia  Anxiety and depression  IC (interstitial cystitis)  History of gastroesophageal reflux (GERD)  Orders: No orders of the defined types were placed in this  encounter.  No orders of the defined types were placed in this encounter.     Follow-Up Instructions: Return in about 6 months (around 03/08/2022) for Osteoporosis, DDD.   Ofilia Neas, PA-C  Note - This record has been created using Dragon software.  Chart creation errors have been sought, but may not always  have been located. Such creation errors do not reflect on  the standard of medical care.

## 2021-08-28 DIAGNOSIS — F32A Depression, unspecified: Secondary | ICD-10-CM | POA: Diagnosis not present

## 2021-08-28 DIAGNOSIS — Z79899 Other long term (current) drug therapy: Secondary | ICD-10-CM | POA: Diagnosis not present

## 2021-08-28 DIAGNOSIS — M542 Cervicalgia: Secondary | ICD-10-CM | POA: Diagnosis not present

## 2021-08-28 DIAGNOSIS — F419 Anxiety disorder, unspecified: Secondary | ICD-10-CM | POA: Diagnosis not present

## 2021-08-28 DIAGNOSIS — M545 Low back pain, unspecified: Secondary | ICD-10-CM | POA: Diagnosis not present

## 2021-08-28 DIAGNOSIS — G609 Hereditary and idiopathic neuropathy, unspecified: Secondary | ICD-10-CM | POA: Diagnosis not present

## 2021-08-28 DIAGNOSIS — G8929 Other chronic pain: Secondary | ICD-10-CM | POA: Diagnosis not present

## 2021-08-30 ENCOUNTER — Inpatient Hospital Stay: Payer: Medicare Other

## 2021-08-31 DIAGNOSIS — F419 Anxiety disorder, unspecified: Secondary | ICD-10-CM | POA: Diagnosis not present

## 2021-08-31 DIAGNOSIS — E43 Unspecified severe protein-calorie malnutrition: Secondary | ICD-10-CM | POA: Diagnosis not present

## 2021-08-31 DIAGNOSIS — Z681 Body mass index (BMI) 19 or less, adult: Secondary | ICD-10-CM | POA: Diagnosis not present

## 2021-08-31 DIAGNOSIS — Z9884 Bariatric surgery status: Secondary | ICD-10-CM | POA: Diagnosis not present

## 2021-08-31 DIAGNOSIS — Z79899 Other long term (current) drug therapy: Secondary | ICD-10-CM | POA: Diagnosis not present

## 2021-08-31 DIAGNOSIS — N3946 Mixed incontinence: Secondary | ICD-10-CM | POA: Diagnosis not present

## 2021-09-03 ENCOUNTER — Other Ambulatory Visit: Payer: Self-pay

## 2021-09-03 ENCOUNTER — Inpatient Hospital Stay: Payer: Medicare Other | Attending: Hematology and Oncology

## 2021-09-03 DIAGNOSIS — D509 Iron deficiency anemia, unspecified: Secondary | ICD-10-CM | POA: Diagnosis not present

## 2021-09-03 DIAGNOSIS — D508 Other iron deficiency anemias: Secondary | ICD-10-CM

## 2021-09-03 LAB — IRON AND IRON BINDING CAPACITY (CC-WL,HP ONLY)
Iron: 67 ug/dL (ref 28–170)
Saturation Ratios: 24 % (ref 10.4–31.8)
TIBC: 279 ug/dL (ref 250–450)
UIBC: 212 ug/dL (ref 148–442)

## 2021-09-03 LAB — FERRITIN: Ferritin: 19 ng/mL (ref 11–307)

## 2021-09-03 LAB — CBC WITH DIFFERENTIAL (CANCER CENTER ONLY)
Abs Immature Granulocytes: 0.01 10*3/uL (ref 0.00–0.07)
Basophils Absolute: 0 10*3/uL (ref 0.0–0.1)
Basophils Relative: 1 %
Eosinophils Absolute: 0.4 10*3/uL (ref 0.0–0.5)
Eosinophils Relative: 7 %
HCT: 36.1 % (ref 36.0–46.0)
Hemoglobin: 12.2 g/dL (ref 12.0–15.0)
Immature Granulocytes: 0 %
Lymphocytes Relative: 41 %
Lymphs Abs: 2.4 10*3/uL (ref 0.7–4.0)
MCH: 31.9 pg (ref 26.0–34.0)
MCHC: 33.8 g/dL (ref 30.0–36.0)
MCV: 94.3 fL (ref 80.0–100.0)
Monocytes Absolute: 0.4 10*3/uL (ref 0.1–1.0)
Monocytes Relative: 7 %
Neutro Abs: 2.6 10*3/uL (ref 1.7–7.7)
Neutrophils Relative %: 44 %
Platelet Count: 240 10*3/uL (ref 150–400)
RBC: 3.83 MIL/uL — ABNORMAL LOW (ref 3.87–5.11)
RDW: 12.8 % (ref 11.5–15.5)
WBC Count: 5.9 10*3/uL (ref 4.0–10.5)
nRBC: 0 % (ref 0.0–0.2)

## 2021-09-03 LAB — RETIC PANEL
Immature Retic Fract: 10.3 % (ref 2.3–15.9)
RBC.: 3.75 MIL/uL — ABNORMAL LOW (ref 3.87–5.11)
Retic Count, Absolute: 57.4 10*3/uL (ref 19.0–186.0)
Retic Ct Pct: 1.5 % (ref 0.4–3.1)
Reticulocyte Hemoglobin: 34.7 pg (ref >27.9)

## 2021-09-03 LAB — CMP (CANCER CENTER ONLY)
ALT: 19 U/L (ref 0–44)
AST: 26 U/L (ref 15–41)
Albumin: 3.3 g/dL — ABNORMAL LOW (ref 3.5–5.0)
Alkaline Phosphatase: 42 U/L (ref 38–126)
Anion gap: 3 — ABNORMAL LOW (ref 5–15)
BUN: 23 mg/dL (ref 8–23)
CO2: 32 mmol/L (ref 22–32)
Calcium: 9.1 mg/dL (ref 8.9–10.3)
Chloride: 105 mmol/L (ref 98–111)
Creatinine: 0.6 mg/dL (ref 0.44–1.00)
GFR, Estimated: >60 mL/min (ref >60)
Glucose, Bld: 97 mg/dL (ref 70–99)
Potassium: 4.5 mmol/L (ref 3.5–5.1)
Sodium: 140 mmol/L (ref 135–145)
Total Bilirubin: 0.3 mg/dL (ref 0.3–1.2)
Total Protein: 5 g/dL — ABNORMAL LOW (ref 6.5–8.1)

## 2021-09-05 ENCOUNTER — Ambulatory Visit: Payer: Medicare Other | Attending: Physician Assistant | Admitting: Physician Assistant

## 2021-09-05 ENCOUNTER — Encounter: Payer: Self-pay | Admitting: Physician Assistant

## 2021-09-05 VITALS — BP 127/75 | HR 101 | Ht 65.0 in | Wt 103.2 lb

## 2021-09-05 DIAGNOSIS — M7061 Trochanteric bursitis, right hip: Secondary | ICD-10-CM | POA: Insufficient documentation

## 2021-09-05 DIAGNOSIS — R5383 Other fatigue: Secondary | ICD-10-CM | POA: Diagnosis not present

## 2021-09-05 DIAGNOSIS — M5136 Other intervertebral disc degeneration, lumbar region: Secondary | ICD-10-CM | POA: Diagnosis not present

## 2021-09-05 DIAGNOSIS — M797 Fibromyalgia: Secondary | ICD-10-CM | POA: Diagnosis not present

## 2021-09-05 DIAGNOSIS — F419 Anxiety disorder, unspecified: Secondary | ICD-10-CM | POA: Insufficient documentation

## 2021-09-05 DIAGNOSIS — M792 Neuralgia and neuritis, unspecified: Secondary | ICD-10-CM | POA: Diagnosis not present

## 2021-09-05 DIAGNOSIS — F32A Depression, unspecified: Secondary | ICD-10-CM | POA: Diagnosis not present

## 2021-09-05 DIAGNOSIS — R252 Cramp and spasm: Secondary | ICD-10-CM | POA: Insufficient documentation

## 2021-09-05 DIAGNOSIS — E162 Hypoglycemia, unspecified: Secondary | ICD-10-CM | POA: Insufficient documentation

## 2021-09-05 DIAGNOSIS — N3010 Interstitial cystitis (chronic) without hematuria: Secondary | ICD-10-CM | POA: Diagnosis not present

## 2021-09-05 DIAGNOSIS — M7062 Trochanteric bursitis, left hip: Secondary | ICD-10-CM | POA: Insufficient documentation

## 2021-09-05 DIAGNOSIS — Z8719 Personal history of other diseases of the digestive system: Secondary | ICD-10-CM | POA: Diagnosis not present

## 2021-09-05 DIAGNOSIS — M5030 Other cervical disc degeneration, unspecified cervical region: Secondary | ICD-10-CM | POA: Diagnosis not present

## 2021-09-05 DIAGNOSIS — E559 Vitamin D deficiency, unspecified: Secondary | ICD-10-CM | POA: Diagnosis not present

## 2021-09-05 DIAGNOSIS — G894 Chronic pain syndrome: Secondary | ICD-10-CM | POA: Insufficient documentation

## 2021-09-05 DIAGNOSIS — Z79899 Other long term (current) drug therapy: Secondary | ICD-10-CM | POA: Diagnosis not present

## 2021-09-05 DIAGNOSIS — M810 Age-related osteoporosis without current pathological fracture: Secondary | ICD-10-CM | POA: Insufficient documentation

## 2021-09-05 DIAGNOSIS — M62838 Other muscle spasm: Secondary | ICD-10-CM | POA: Insufficient documentation

## 2021-09-10 DIAGNOSIS — Z9884 Bariatric surgery status: Secondary | ICD-10-CM | POA: Diagnosis not present

## 2021-09-10 DIAGNOSIS — Z681 Body mass index (BMI) 19 or less, adult: Secondary | ICD-10-CM | POA: Diagnosis not present

## 2021-09-10 DIAGNOSIS — E161 Other hypoglycemia: Secondary | ICD-10-CM | POA: Diagnosis not present

## 2021-09-10 DIAGNOSIS — K912 Postsurgical malabsorption, not elsewhere classified: Secondary | ICD-10-CM | POA: Diagnosis not present

## 2021-09-10 DIAGNOSIS — Z8639 Personal history of other endocrine, nutritional and metabolic disease: Secondary | ICD-10-CM | POA: Diagnosis not present

## 2021-09-10 DIAGNOSIS — Z48815 Encounter for surgical aftercare following surgery on the digestive system: Secondary | ICD-10-CM | POA: Diagnosis not present

## 2021-09-10 DIAGNOSIS — Z713 Dietary counseling and surveillance: Secondary | ICD-10-CM | POA: Diagnosis not present

## 2021-09-10 DIAGNOSIS — R636 Underweight: Secondary | ICD-10-CM | POA: Diagnosis not present

## 2021-09-11 ENCOUNTER — Other Ambulatory Visit: Payer: Medicare Other | Admitting: Nurse Practitioner

## 2021-09-11 ENCOUNTER — Encounter: Payer: Self-pay | Admitting: Nurse Practitioner

## 2021-09-11 DIAGNOSIS — R5381 Other malaise: Secondary | ICD-10-CM | POA: Diagnosis not present

## 2021-09-11 DIAGNOSIS — Z515 Encounter for palliative care: Secondary | ICD-10-CM | POA: Diagnosis not present

## 2021-09-11 DIAGNOSIS — R630 Anorexia: Secondary | ICD-10-CM | POA: Diagnosis not present

## 2021-09-11 NOTE — Progress Notes (Signed)
[facility] Consult Note Telephone: [phone]  Fax: [phone]    Date of encounter: 09/11/21 2:42 PM PATIENT NAME: Kayla [name] [address] [id number]   [phone] (home)  DOB: [birth year] MRN: [id number] PRIMARY CARE PROVIDER:    [place],  Kayla [name] [id number] [phone]  RESPONSIBLE PARTY:    Contact Information     Name Relation Home Work [employer] D Spouse   [phone]   Brock Ra Daughter  [phone]      Due to the COVID-19 crisis, this visit was done via telemedicine from my office and it was initiated and consent by this patient and or family.   I connected with  Kayla [name] on 05/31/21 by telephone as video not available enabled telemedicine application and verified that I am speaking with the correct person using two identifiers.   I discussed the limitations of evaluation and management by telemedicine. The patient expressed understanding and agreed to proceed.  Palliative Care was asked to follow this patient by consultation request of  Kayla Bien, MD to address advance care planning and complex medical decision making.    Symptom Management/Plan: 1. Advance Care Planning;  Full code with aggressive interventions   2. Anorexia secondary to multifactorial discussed multiple disease processes with progression including nutrition at length concerning protein, calories, how often to eat and fluids. We talked about TPN still pending as well as f/u visit with Bariatric surgeon about possible reversal. We talked about Rheumatology appointment with wishes to restart celebrex, will defer to Kayla [name] as since they are following with primary. Kayla [name] is followed by pain clinic. We talked about quality of life,  medical goals, ros, functional abilities, family and social issues her grandson  has cancer currently hospitalized.    3. Goals of Care: Goals include to maximize quality of life and symptom management. Our advance care planning conversation included a discussion about:    The value and importance of advance care planning  Exploration of personal, cultural or spiritual beliefs that might influence medical decisions  Exploration of goals of care in the event of a sudden injury or illness  Identification and preparation of a healthcare agent  Review and updating or creation of an advance directive document.   4. Debility secondary to multifactorial. Discussed at length about functional abilities, Continue to encourage mobility, fall risk   5. Palliative care encounter; Palliative care encounter; Palliative medicine team will continue to support patient, patient's family, and medical team. Visit consisted of counseling and education dealing with the complex and emotionally intense issues of symptom management and palliative care in the setting of serious and potentially life-threatening illness Follow up Palliative Care Visit: Palliative care will continue to follow for complex medical decision making, advance care planning, and clarification of goals.    I spent 44 minutes providing this consultation. More than 50% of the time in this consultation was spent in counseling and care coordination. PPS: 40% Chief Complaint: Initial palliative consult for complex medical decision making HISTORY OF PRESENT ILLNESS:  Kayla [name] is a 76 y.o. year old female  with multiple medical problems including h/o gastric bypass sgy with revision and altered anatomy with small stomach pouch secondary to Barrett esophagus, dysphagia, IDA, anorexia, angina, degeneration of cervical, lumbosacral discs, cervical facet joint syndrome, fibromyalgia, chronic pain, chronic interstitial cystitis, ckd, nephrolithiasis, kidney stones, chronic maxillary  sinusitis, gerd, h/o hiatal hernia, chronic migrane  h/a, hyperparathyroidism, IBS, HTN, hypoglycemia, DM, heart murmur, mitral regurgitation, MVP, neuropathy, osteoporosis, pvd, PTSD, anxiety. I connected with Kayla [name] by telephone as video not available. We talked about PC visit. Kayla [name] in agreement. We talked about recent visits with Kayla [name] ([facility]) who continues to follow for primary care at home. We talked about visits with nutrition at length concerning protein, calories, how often to eat and fluids. We talked about TPN still pending as well as f/u visit with Bariatric surgeon about possible reversal. We talked about Rheumatology appointment with wishes to restart celebrex, will defer to Kayla [name] as since they are following with primary. Kayla [name] is followed by pain clinic. We talked about quality of life,  medical goals, ros, functional abilities, family and social issues her grandson has cancer currently hospitalized. We talked about motivation, daily routine, and goc. Discussed and Kayla [name] wishes for next visit to be at [facility], scheduled, Therapeutic listening, emotional support provided. Questions answered.   Kayla [name] in agreement. Therapeutic listening, emotional support provided. Questions answered. Contact information provided.    History obtained from review of EMR, discussion with Kayla [name].  I reviewed available labs, medications, imaging, studies and related documents from the EMR.  Records reviewed and summarized above.    ROS 10 point system reviewed all negative except positives HPI   Physical Exam: deferred  Thank you for the opportunity to participate in the care of Kayla [name].  The palliative care team will continue to follow. Please call our office at [phone] if we can be of additional assistance.   Kayla Skousen Ihor Gully, NP

## 2021-09-13 DIAGNOSIS — E119 Type 2 diabetes mellitus without complications: Secondary | ICD-10-CM | POA: Diagnosis not present

## 2021-09-13 DIAGNOSIS — D508 Other iron deficiency anemias: Secondary | ICD-10-CM | POA: Diagnosis not present

## 2021-09-13 DIAGNOSIS — I5022 Chronic systolic (congestive) heart failure: Secondary | ICD-10-CM | POA: Diagnosis not present

## 2021-09-13 DIAGNOSIS — M810 Age-related osteoporosis without current pathological fracture: Secondary | ICD-10-CM | POA: Diagnosis not present

## 2021-09-13 DIAGNOSIS — F4321 Adjustment disorder with depressed mood: Secondary | ICD-10-CM | POA: Diagnosis not present

## 2021-09-13 DIAGNOSIS — M797 Fibromyalgia: Secondary | ICD-10-CM | POA: Diagnosis not present

## 2021-09-24 ENCOUNTER — Telehealth: Payer: Self-pay

## 2021-09-24 NOTE — Telephone Encounter (Signed)
238 pm.  Patient left message regarding UTI systems.  Return call made and message left for patient to follow up with PCP per Christin Gusler, NP's request.

## 2021-09-25 DIAGNOSIS — G43909 Migraine, unspecified, not intractable, without status migrainosus: Secondary | ICD-10-CM | POA: Diagnosis not present

## 2021-09-25 DIAGNOSIS — R350 Frequency of micturition: Secondary | ICD-10-CM | POA: Diagnosis not present

## 2021-09-25 DIAGNOSIS — F419 Anxiety disorder, unspecified: Secondary | ICD-10-CM | POA: Diagnosis not present

## 2021-09-25 DIAGNOSIS — Z87440 Personal history of urinary (tract) infections: Secondary | ICD-10-CM | POA: Diagnosis not present

## 2021-09-26 ENCOUNTER — Ambulatory Visit: Payer: Medicare Other | Admitting: Nurse Practitioner

## 2021-09-27 DIAGNOSIS — Z9884 Bariatric surgery status: Secondary | ICD-10-CM | POA: Diagnosis not present

## 2021-09-27 DIAGNOSIS — Z8639 Personal history of other endocrine, nutritional and metabolic disease: Secondary | ICD-10-CM | POA: Diagnosis not present

## 2021-09-27 DIAGNOSIS — R350 Frequency of micturition: Secondary | ICD-10-CM | POA: Diagnosis not present

## 2021-09-27 DIAGNOSIS — R634 Abnormal weight loss: Secondary | ICD-10-CM | POA: Diagnosis not present

## 2021-09-27 DIAGNOSIS — R1319 Other dysphagia: Secondary | ICD-10-CM | POA: Diagnosis not present

## 2021-09-27 DIAGNOSIS — K912 Postsurgical malabsorption, not elsewhere classified: Secondary | ICD-10-CM | POA: Diagnosis not present

## 2021-09-27 DIAGNOSIS — E161 Other hypoglycemia: Secondary | ICD-10-CM | POA: Diagnosis not present

## 2021-09-28 DIAGNOSIS — G894 Chronic pain syndrome: Secondary | ICD-10-CM | POA: Diagnosis not present

## 2021-09-28 DIAGNOSIS — G8929 Other chronic pain: Secondary | ICD-10-CM | POA: Diagnosis not present

## 2021-09-28 DIAGNOSIS — M542 Cervicalgia: Secondary | ICD-10-CM | POA: Diagnosis not present

## 2021-09-28 DIAGNOSIS — F419 Anxiety disorder, unspecified: Secondary | ICD-10-CM | POA: Diagnosis not present

## 2021-09-28 DIAGNOSIS — F32A Depression, unspecified: Secondary | ICD-10-CM | POA: Diagnosis not present

## 2021-09-28 DIAGNOSIS — M545 Low back pain, unspecified: Secondary | ICD-10-CM | POA: Diagnosis not present

## 2021-09-28 DIAGNOSIS — G609 Hereditary and idiopathic neuropathy, unspecified: Secondary | ICD-10-CM | POA: Diagnosis not present

## 2021-10-02 DIAGNOSIS — N3942 Incontinence without sensory awareness: Secondary | ICD-10-CM | POA: Diagnosis not present

## 2021-10-02 DIAGNOSIS — B9620 Unspecified Escherichia coli [E. coli] as the cause of diseases classified elsewhere: Secondary | ICD-10-CM | POA: Diagnosis not present

## 2021-10-04 ENCOUNTER — Ambulatory Visit: Payer: Medicare Other | Admitting: Neurology

## 2021-10-04 DIAGNOSIS — E161 Other hypoglycemia: Secondary | ICD-10-CM | POA: Diagnosis not present

## 2021-10-04 DIAGNOSIS — Z8639 Personal history of other endocrine, nutritional and metabolic disease: Secondary | ICD-10-CM | POA: Diagnosis not present

## 2021-10-04 DIAGNOSIS — R634 Abnormal weight loss: Secondary | ICD-10-CM | POA: Diagnosis not present

## 2021-10-04 DIAGNOSIS — R1310 Dysphagia, unspecified: Secondary | ICD-10-CM | POA: Diagnosis not present

## 2021-10-04 DIAGNOSIS — E1142 Type 2 diabetes mellitus with diabetic polyneuropathy: Secondary | ICD-10-CM | POA: Diagnosis not present

## 2021-10-04 DIAGNOSIS — R1319 Other dysphagia: Secondary | ICD-10-CM | POA: Diagnosis not present

## 2021-10-04 DIAGNOSIS — Z9884 Bariatric surgery status: Secondary | ICD-10-CM | POA: Diagnosis not present

## 2021-10-04 DIAGNOSIS — K912 Postsurgical malabsorption, not elsewhere classified: Secondary | ICD-10-CM | POA: Diagnosis not present

## 2021-10-05 ENCOUNTER — Other Ambulatory Visit (HOSPITAL_BASED_OUTPATIENT_CLINIC_OR_DEPARTMENT_OTHER): Payer: Self-pay

## 2021-10-09 DIAGNOSIS — F419 Anxiety disorder, unspecified: Secondary | ICD-10-CM | POA: Diagnosis not present

## 2021-10-09 DIAGNOSIS — Z79899 Other long term (current) drug therapy: Secondary | ICD-10-CM | POA: Diagnosis not present

## 2021-10-09 DIAGNOSIS — E43 Unspecified severe protein-calorie malnutrition: Secondary | ICD-10-CM | POA: Diagnosis not present

## 2021-10-09 DIAGNOSIS — N3946 Mixed incontinence: Secondary | ICD-10-CM | POA: Diagnosis not present

## 2021-10-09 DIAGNOSIS — Z681 Body mass index (BMI) 19 or less, adult: Secondary | ICD-10-CM | POA: Diagnosis not present

## 2021-10-10 DIAGNOSIS — Z681 Body mass index (BMI) 19 or less, adult: Secondary | ICD-10-CM | POA: Diagnosis not present

## 2021-10-10 DIAGNOSIS — K912 Postsurgical malabsorption, not elsewhere classified: Secondary | ICD-10-CM | POA: Diagnosis not present

## 2021-10-10 DIAGNOSIS — D508 Other iron deficiency anemias: Secondary | ICD-10-CM | POA: Diagnosis not present

## 2021-10-10 DIAGNOSIS — Z9884 Bariatric surgery status: Secondary | ICD-10-CM | POA: Diagnosis not present

## 2021-10-10 DIAGNOSIS — E161 Other hypoglycemia: Secondary | ICD-10-CM | POA: Diagnosis not present

## 2021-10-10 DIAGNOSIS — R634 Abnormal weight loss: Secondary | ICD-10-CM | POA: Diagnosis not present

## 2021-10-10 DIAGNOSIS — F4321 Adjustment disorder with depressed mood: Secondary | ICD-10-CM | POA: Diagnosis not present

## 2021-10-10 DIAGNOSIS — M810 Age-related osteoporosis without current pathological fracture: Secondary | ICD-10-CM | POA: Diagnosis not present

## 2021-10-10 DIAGNOSIS — M797 Fibromyalgia: Secondary | ICD-10-CM | POA: Diagnosis not present

## 2021-10-10 DIAGNOSIS — R1314 Dysphagia, pharyngoesophageal phase: Secondary | ICD-10-CM | POA: Diagnosis not present

## 2021-10-10 DIAGNOSIS — Z48815 Encounter for surgical aftercare following surgery on the digestive system: Secondary | ICD-10-CM | POA: Diagnosis not present

## 2021-10-10 DIAGNOSIS — R1319 Other dysphagia: Secondary | ICD-10-CM | POA: Diagnosis not present

## 2021-10-10 DIAGNOSIS — Z79899 Other long term (current) drug therapy: Secondary | ICD-10-CM | POA: Diagnosis not present

## 2021-10-10 DIAGNOSIS — E119 Type 2 diabetes mellitus without complications: Secondary | ICD-10-CM | POA: Diagnosis not present

## 2021-10-10 DIAGNOSIS — Z713 Dietary counseling and surveillance: Secondary | ICD-10-CM | POA: Diagnosis not present

## 2021-10-10 DIAGNOSIS — I5022 Chronic systolic (congestive) heart failure: Secondary | ICD-10-CM | POA: Diagnosis not present

## 2021-10-12 DIAGNOSIS — E1142 Type 2 diabetes mellitus with diabetic polyneuropathy: Secondary | ICD-10-CM | POA: Diagnosis not present

## 2021-10-16 DIAGNOSIS — K912 Postsurgical malabsorption, not elsewhere classified: Secondary | ICD-10-CM | POA: Diagnosis not present

## 2021-10-17 DIAGNOSIS — H0102A Squamous blepharitis right eye, upper and lower eyelids: Secondary | ICD-10-CM | POA: Diagnosis not present

## 2021-10-17 DIAGNOSIS — R519 Headache, unspecified: Secondary | ICD-10-CM | POA: Diagnosis not present

## 2021-10-17 DIAGNOSIS — H40023 Open angle with borderline findings, high risk, bilateral: Secondary | ICD-10-CM | POA: Diagnosis not present

## 2021-10-17 DIAGNOSIS — D23111 Other benign neoplasm of skin of right upper eyelid, including canthus: Secondary | ICD-10-CM | POA: Diagnosis not present

## 2021-10-17 DIAGNOSIS — H353131 Nonexudative age-related macular degeneration, bilateral, early dry stage: Secondary | ICD-10-CM | POA: Diagnosis not present

## 2021-10-22 DIAGNOSIS — E440 Moderate protein-calorie malnutrition: Secondary | ICD-10-CM | POA: Diagnosis not present

## 2021-10-22 DIAGNOSIS — Z9884 Bariatric surgery status: Secondary | ICD-10-CM | POA: Diagnosis not present

## 2021-10-22 DIAGNOSIS — Z23 Encounter for immunization: Secondary | ICD-10-CM | POA: Diagnosis not present

## 2021-10-23 ENCOUNTER — Ambulatory Visit: Payer: Medicare Other | Admitting: Neurology

## 2021-10-23 DIAGNOSIS — Z452 Encounter for adjustment and management of vascular access device: Secondary | ICD-10-CM | POA: Diagnosis not present

## 2021-10-23 DIAGNOSIS — R634 Abnormal weight loss: Secondary | ICD-10-CM | POA: Diagnosis not present

## 2021-10-23 DIAGNOSIS — K912 Postsurgical malabsorption, not elsewhere classified: Secondary | ICD-10-CM | POA: Diagnosis not present

## 2021-10-23 DIAGNOSIS — Z9884 Bariatric surgery status: Secondary | ICD-10-CM | POA: Diagnosis not present

## 2021-10-24 DIAGNOSIS — Z9884 Bariatric surgery status: Secondary | ICD-10-CM | POA: Diagnosis not present

## 2021-10-24 DIAGNOSIS — K912 Postsurgical malabsorption, not elsewhere classified: Secondary | ICD-10-CM | POA: Diagnosis not present

## 2021-10-24 DIAGNOSIS — R634 Abnormal weight loss: Secondary | ICD-10-CM | POA: Diagnosis not present

## 2021-10-25 DIAGNOSIS — R634 Abnormal weight loss: Secondary | ICD-10-CM | POA: Diagnosis not present

## 2021-10-25 DIAGNOSIS — Z9884 Bariatric surgery status: Secondary | ICD-10-CM | POA: Diagnosis not present

## 2021-10-25 DIAGNOSIS — K912 Postsurgical malabsorption, not elsewhere classified: Secondary | ICD-10-CM | POA: Diagnosis not present

## 2021-10-26 DIAGNOSIS — Z9884 Bariatric surgery status: Secondary | ICD-10-CM | POA: Diagnosis not present

## 2021-10-29 DIAGNOSIS — Z9884 Bariatric surgery status: Secondary | ICD-10-CM | POA: Diagnosis not present

## 2021-10-29 DIAGNOSIS — E440 Moderate protein-calorie malnutrition: Secondary | ICD-10-CM | POA: Diagnosis not present

## 2021-10-30 DIAGNOSIS — N189 Chronic kidney disease, unspecified: Secondary | ICD-10-CM | POA: Diagnosis not present

## 2021-10-30 DIAGNOSIS — E1122 Type 2 diabetes mellitus with diabetic chronic kidney disease: Secondary | ICD-10-CM | POA: Diagnosis not present

## 2021-10-30 DIAGNOSIS — G8929 Other chronic pain: Secondary | ICD-10-CM | POA: Diagnosis not present

## 2021-10-30 DIAGNOSIS — K912 Postsurgical malabsorption, not elsewhere classified: Secondary | ICD-10-CM | POA: Diagnosis not present

## 2021-10-30 DIAGNOSIS — K2270 Barrett's esophagus without dysplasia: Secondary | ICD-10-CM | POA: Diagnosis not present

## 2021-10-30 DIAGNOSIS — R413 Other amnesia: Secondary | ICD-10-CM | POA: Diagnosis not present

## 2021-10-30 DIAGNOSIS — Z9181 History of falling: Secondary | ICD-10-CM | POA: Diagnosis not present

## 2021-10-30 DIAGNOSIS — G43909 Migraine, unspecified, not intractable, without status migrainosus: Secondary | ICD-10-CM | POA: Diagnosis not present

## 2021-10-30 DIAGNOSIS — F411 Generalized anxiety disorder: Secondary | ICD-10-CM | POA: Diagnosis not present

## 2021-10-30 DIAGNOSIS — E559 Vitamin D deficiency, unspecified: Secondary | ICD-10-CM | POA: Diagnosis not present

## 2021-10-30 DIAGNOSIS — K5909 Other constipation: Secondary | ICD-10-CM | POA: Diagnosis not present

## 2021-10-30 DIAGNOSIS — R634 Abnormal weight loss: Secondary | ICD-10-CM | POA: Diagnosis not present

## 2021-10-30 DIAGNOSIS — I341 Nonrheumatic mitral (valve) prolapse: Secondary | ICD-10-CM | POA: Diagnosis not present

## 2021-10-30 DIAGNOSIS — H3530 Unspecified macular degeneration: Secondary | ICD-10-CM | POA: Diagnosis not present

## 2021-10-30 DIAGNOSIS — Z452 Encounter for adjustment and management of vascular access device: Secondary | ICD-10-CM | POA: Diagnosis not present

## 2021-10-30 DIAGNOSIS — D509 Iron deficiency anemia, unspecified: Secondary | ICD-10-CM | POA: Diagnosis not present

## 2021-10-30 DIAGNOSIS — I340 Nonrheumatic mitral (valve) insufficiency: Secondary | ICD-10-CM | POA: Diagnosis not present

## 2021-10-30 DIAGNOSIS — F339 Major depressive disorder, recurrent, unspecified: Secondary | ICD-10-CM | POA: Diagnosis not present

## 2021-10-30 DIAGNOSIS — M797 Fibromyalgia: Secondary | ICD-10-CM | POA: Diagnosis not present

## 2021-10-30 DIAGNOSIS — E43 Unspecified severe protein-calorie malnutrition: Secondary | ICD-10-CM | POA: Diagnosis not present

## 2021-10-30 DIAGNOSIS — R1310 Dysphagia, unspecified: Secondary | ICD-10-CM | POA: Diagnosis not present

## 2021-10-30 DIAGNOSIS — M810 Age-related osteoporosis without current pathological fracture: Secondary | ICD-10-CM | POA: Diagnosis not present

## 2021-10-30 DIAGNOSIS — D631 Anemia in chronic kidney disease: Secondary | ICD-10-CM | POA: Diagnosis not present

## 2021-10-30 DIAGNOSIS — N200 Calculus of kidney: Secondary | ICD-10-CM | POA: Diagnosis not present

## 2021-10-30 DIAGNOSIS — Z8711 Personal history of peptic ulcer disease: Secondary | ICD-10-CM | POA: Diagnosis not present

## 2021-10-30 DIAGNOSIS — E1142 Type 2 diabetes mellitus with diabetic polyneuropathy: Secondary | ICD-10-CM | POA: Diagnosis not present

## 2021-10-30 DIAGNOSIS — Z23 Encounter for immunization: Secondary | ICD-10-CM | POA: Diagnosis not present

## 2021-10-31 ENCOUNTER — Telehealth: Payer: Self-pay

## 2021-10-31 DIAGNOSIS — M542 Cervicalgia: Secondary | ICD-10-CM | POA: Diagnosis not present

## 2021-10-31 DIAGNOSIS — M545 Low back pain, unspecified: Secondary | ICD-10-CM | POA: Diagnosis not present

## 2021-10-31 DIAGNOSIS — F419 Anxiety disorder, unspecified: Secondary | ICD-10-CM | POA: Diagnosis not present

## 2021-10-31 DIAGNOSIS — G894 Chronic pain syndrome: Secondary | ICD-10-CM | POA: Diagnosis not present

## 2021-10-31 DIAGNOSIS — F32A Depression, unspecified: Secondary | ICD-10-CM | POA: Diagnosis not present

## 2021-10-31 DIAGNOSIS — G8929 Other chronic pain: Secondary | ICD-10-CM | POA: Diagnosis not present

## 2021-10-31 DIAGNOSIS — G609 Hereditary and idiopathic neuropathy, unspecified: Secondary | ICD-10-CM | POA: Diagnosis not present

## 2021-10-31 NOTE — Telephone Encounter (Signed)
1120 am.  Patient with recent discharge from the hospital.  Phone call made to schedule a virtual visit with Christin Gusler, NP.  No answer.  Message left requesting a call back.

## 2021-10-31 NOTE — Telephone Encounter (Signed)
200 pm.  Incoming call from patient.  Visit scheduled for 10/31 at 130 pm.

## 2021-11-01 DIAGNOSIS — K2270 Barrett's esophagus without dysplasia: Secondary | ICD-10-CM | POA: Diagnosis not present

## 2021-11-01 DIAGNOSIS — Z452 Encounter for adjustment and management of vascular access device: Secondary | ICD-10-CM | POA: Diagnosis not present

## 2021-11-01 DIAGNOSIS — E1142 Type 2 diabetes mellitus with diabetic polyneuropathy: Secondary | ICD-10-CM | POA: Diagnosis not present

## 2021-11-01 DIAGNOSIS — K912 Postsurgical malabsorption, not elsewhere classified: Secondary | ICD-10-CM | POA: Diagnosis not present

## 2021-11-01 DIAGNOSIS — E43 Unspecified severe protein-calorie malnutrition: Secondary | ICD-10-CM | POA: Diagnosis not present

## 2021-11-01 DIAGNOSIS — R1310 Dysphagia, unspecified: Secondary | ICD-10-CM | POA: Diagnosis not present

## 2021-11-05 ENCOUNTER — Telehealth: Payer: Self-pay | Admitting: Neurology

## 2021-11-05 ENCOUNTER — Other Ambulatory Visit: Payer: Self-pay | Admitting: Neurology

## 2021-11-05 DIAGNOSIS — Z452 Encounter for adjustment and management of vascular access device: Secondary | ICD-10-CM | POA: Diagnosis not present

## 2021-11-05 DIAGNOSIS — R1310 Dysphagia, unspecified: Secondary | ICD-10-CM | POA: Diagnosis not present

## 2021-11-05 DIAGNOSIS — K912 Postsurgical malabsorption, not elsewhere classified: Secondary | ICD-10-CM | POA: Diagnosis not present

## 2021-11-05 DIAGNOSIS — E43 Unspecified severe protein-calorie malnutrition: Secondary | ICD-10-CM | POA: Diagnosis not present

## 2021-11-05 DIAGNOSIS — K2270 Barrett's esophagus without dysplasia: Secondary | ICD-10-CM | POA: Diagnosis not present

## 2021-11-05 DIAGNOSIS — E1142 Type 2 diabetes mellitus with diabetic polyneuropathy: Secondary | ICD-10-CM | POA: Diagnosis not present

## 2021-11-05 MED ORDER — BUTALBITAL-APAP-CAFFEINE 50-325-40 MG PO TABS: 1.0000 | ORAL_TABLET | Freq: Four times a day (QID) | ORAL | 0 refills | Status: DC | PRN

## 2021-11-05 NOTE — Telephone Encounter (Signed)
Patient had a TPN inserted in her heart last week at [facility].  In the hospital and since then her headaches have gotten much worse.  Fiorcet was prescribed in the hospital and she is requesting an Rx for it.  Patient is getting ready to leave on a trip to [facility] to see a family member who is very sick.  She'd like that sent in as soon as possible if okay with Dr. Tomi Likens.  She is still taking Imitrex but can only take that as prescribed.  [facility] [address]

## 2021-11-05 NOTE — Telephone Encounter (Signed)
Patient advised of Dr.Jaffe note, sent a prescription for [facility]et to the [facility] on [address].  I only provided 10 tablets because it is not good to take too often and easily causes rebound headache.

## 2021-11-05 NOTE — Telephone Encounter (Signed)
Telephone call to patient, Her headaches since her hospital visit on 10/21/21 have gotten more intense and last longer. Patient states she has new stress and family issues.  Patient wants to know if she could get a script for Fioricet.

## 2021-11-07 DIAGNOSIS — M797 Fibromyalgia: Secondary | ICD-10-CM | POA: Diagnosis not present

## 2021-11-07 DIAGNOSIS — E119 Type 2 diabetes mellitus without complications: Secondary | ICD-10-CM | POA: Diagnosis not present

## 2021-11-07 DIAGNOSIS — I5022 Chronic systolic (congestive) heart failure: Secondary | ICD-10-CM | POA: Diagnosis not present

## 2021-11-07 DIAGNOSIS — M810 Age-related osteoporosis without current pathological fracture: Secondary | ICD-10-CM | POA: Diagnosis not present

## 2021-11-07 DIAGNOSIS — D508 Other iron deficiency anemias: Secondary | ICD-10-CM | POA: Diagnosis not present

## 2021-11-12 ENCOUNTER — Other Ambulatory Visit (HOSPITAL_BASED_OUTPATIENT_CLINIC_OR_DEPARTMENT_OTHER): Payer: Self-pay

## 2021-11-12 DIAGNOSIS — R1310 Dysphagia, unspecified: Secondary | ICD-10-CM | POA: Diagnosis not present

## 2021-11-12 DIAGNOSIS — M542 Cervicalgia: Secondary | ICD-10-CM | POA: Diagnosis not present

## 2021-11-12 DIAGNOSIS — E43 Unspecified severe protein-calorie malnutrition: Secondary | ICD-10-CM | POA: Diagnosis not present

## 2021-11-12 DIAGNOSIS — K2270 Barrett's esophagus without dysplasia: Secondary | ICD-10-CM | POA: Diagnosis not present

## 2021-11-12 DIAGNOSIS — M545 Low back pain, unspecified: Secondary | ICD-10-CM | POA: Diagnosis not present

## 2021-11-12 DIAGNOSIS — G8929 Other chronic pain: Secondary | ICD-10-CM | POA: Diagnosis not present

## 2021-11-12 DIAGNOSIS — F32A Depression, unspecified: Secondary | ICD-10-CM | POA: Diagnosis not present

## 2021-11-12 DIAGNOSIS — G609 Hereditary and idiopathic neuropathy, unspecified: Secondary | ICD-10-CM | POA: Diagnosis not present

## 2021-11-12 DIAGNOSIS — G894 Chronic pain syndrome: Secondary | ICD-10-CM | POA: Diagnosis not present

## 2021-11-12 DIAGNOSIS — E1142 Type 2 diabetes mellitus with diabetic polyneuropathy: Secondary | ICD-10-CM | POA: Diagnosis not present

## 2021-11-12 DIAGNOSIS — Z452 Encounter for adjustment and management of vascular access device: Secondary | ICD-10-CM | POA: Diagnosis not present

## 2021-11-12 DIAGNOSIS — K912 Postsurgical malabsorption, not elsewhere classified: Secondary | ICD-10-CM | POA: Diagnosis not present

## 2021-11-12 DIAGNOSIS — F419 Anxiety disorder, unspecified: Secondary | ICD-10-CM | POA: Diagnosis not present

## 2021-11-12 MED ORDER — HYDROCODONE-ACETAMINOPHEN 10-325 MG PO TABS
1.0000 | ORAL_TABLET | ORAL | 0 refills | Status: DC
  Filled 2021-11-12: qty 150, 30d supply, fill #0

## 2021-11-12 MED ORDER — FENTANYL 25 MCG/HR TD PT72: MEDICATED_PATCH | TRANSDERMAL | 0 refills | Status: DC

## 2021-11-13 ENCOUNTER — Ambulatory Visit: Payer: Medicare Other | Admitting: Nurse Practitioner

## 2021-11-13 DIAGNOSIS — Z515 Encounter for palliative care: Secondary | ICD-10-CM

## 2021-11-13 DIAGNOSIS — R5381 Other malaise: Secondary | ICD-10-CM

## 2021-11-13 DIAGNOSIS — R630 Anorexia: Secondary | ICD-10-CM

## 2021-11-14 ENCOUNTER — Encounter: Payer: Self-pay | Admitting: Nurse Practitioner

## 2021-11-14 NOTE — Progress Notes (Signed)
[facility] Consult Note Telephone: [phone]  Fax: [phone]    Date of encounter: 11/14/21 9:29 AM PATIENT NAME: Kayla [name] [address] [place] [address]   [phone] (home)  DOB: [birth year] MRN: [id number] PRIMARY CARE PROVIDER:    [place],  [place] [address] [phone]  RESPONSIBLE PARTY:    Contact Information     Name Relation Home Work Mango D Spouse   [phone]   Brock Ra Daughter  [phone]       Due to the COVID-19 crisis, this visit was done via telemedicine from my office and it was initiated and consent by this patient and or family.   I connected with  [name] OR PROXY on 05/31/21 by telephone as video not available enabled telemedicine application and verified that I am speaking with the correct person using two identifiers.   I discussed the limitations of evaluation and management by telemedicine. The patient expressed understanding and agreed to proceed.  Palliative Care was asked to follow this patient by consultation request of  [name], MD to address advance care planning and complex medical decision making.    Symptom Management/Plan: 1. Advance Care Planning;  Full code with aggressive interventions  PC NP will sign off today due to program changes, will have PC RN/PC SW continue to follow due to high risk with significant weight loss.   2. Anorexia secondary to multifactorial discussed multiple disease processes with progression including nutrition at length concerning protein, calories, how often to eat and fluids. We talked about TPN started which she is now receiving; as well as f/u visit with Bariatric surgeon about possible reversal.    3. Goals of Care: Goals include to maximize quality of life and symptom management. Our advance care planning conversation  included a discussion about:    The value and importance of advance care planning  Exploration of personal, cultural or spiritual beliefs that might influence medical decisions  Exploration of goals of care in the event of a sudden injury or illness  Identification and preparation of a healthcare agent  Review and updating or creation of an advance directive document.   4. Debility secondary to multifactorial. Discussed at length about functional abilities, Continue to encourage mobility, fall risk   5. Palliative care encounter; Palliative care encounter; Palliative medicine team will continue to support patient, patient's family, and medical team. Visit consisted of counseling and education dealing with the complex and emotionally intense issues of symptom management and palliative care in the setting of serious and potentially life-threatening illness Follow up Palliative Care Visit: Palliative care will continue to follow for complex medical decision making, advance care planning, and clarification of goals.    I spent 34 minutes providing this consultation. More than 50% of the time in this consultation was spent in counseling and care coordination. PPS: 50% Chief Complaint: Initial palliative consult for complex medical decision making HISTORY OF PRESENT ILLNESS:  Kayla [name] is a 76 y.o. year old female  with multiple medical problems including h/o gastric bypass sgy with revision and altered anatomy with small stomach pouch secondary to Barrett esophagus, dysphagia, IDA, anorexia, angina, degeneration of cervical, lumbosacral discs, cervical facet joint syndrome, fibromyalgia, chronic pain, chronic interstitial cystitis, ckd, nephrolithiasis, kidney stones, chronic maxillary sinusitis, gerd, h/o hiatal hernia, chronic migrane h/a, hyperparathyroidism, IBS, HTN, hypoglycemia, DM, heart murmur, mitral regurgitation, MVP,  neuropathy, osteoporosis, pvd, PTSD, anxiety. I connected with Ms.  [name] by telephone as video not available. We talked about PC visit. [name] in agreement. We talked about recent visits with [facility] who will be d/c in 4 weeks from their services. We talked about Alvester Chou NP for another in home Primary provider. Contact information provided. Will send Alvester Chou NP referral. We talked about role pc with pc program changes. Discussed will PC NP will sign off today due to program changes, will have PC RN/PC SW continue to follow due to high risk with significant weight loss. We talked about recent initiation of TPN. [name] endorses the home health RN is following her for infusion once a week for central line dressing changes, draw labs. Ms. [name] endorses her husband is doing the TPN bag changes, flushes. We talked about ros, medical goals. We talked about functional abilities. We talked about quality of life. Therapeutic listening, emotional support provided. [name] verbalized understanding. Questions answered. Contact information given.    [name] in agreement. Therapeutic listening, emotional support provided. Questions answered. Contact information provided.    History obtained from review of EMR, discussion with [name].  I reviewed available labs, medications, imaging, studies and related documents from the EMR.  Records reviewed and summarized above.    ROS 10 point system reviewed all negative except positives HPI   Physical Exam: deferred Thank you for the opportunity to participate in the care of [name].  The palliative care team will continue to follow. Please call our office at [phone] if we can be of additional assistance.   Sou Nohr Ihor Gully, NP

## 2021-11-19 DIAGNOSIS — R1310 Dysphagia, unspecified: Secondary | ICD-10-CM | POA: Diagnosis not present

## 2021-11-19 DIAGNOSIS — E43 Unspecified severe protein-calorie malnutrition: Secondary | ICD-10-CM | POA: Diagnosis not present

## 2021-11-19 DIAGNOSIS — Z452 Encounter for adjustment and management of vascular access device: Secondary | ICD-10-CM | POA: Diagnosis not present

## 2021-11-19 DIAGNOSIS — K912 Postsurgical malabsorption, not elsewhere classified: Secondary | ICD-10-CM | POA: Diagnosis not present

## 2021-11-19 DIAGNOSIS — K2270 Barrett's esophagus without dysplasia: Secondary | ICD-10-CM | POA: Diagnosis not present

## 2021-11-19 DIAGNOSIS — E1142 Type 2 diabetes mellitus with diabetic polyneuropathy: Secondary | ICD-10-CM | POA: Diagnosis not present

## 2021-11-20 DIAGNOSIS — R110 Nausea: Secondary | ICD-10-CM | POA: Diagnosis not present

## 2021-11-20 DIAGNOSIS — R1310 Dysphagia, unspecified: Secondary | ICD-10-CM | POA: Diagnosis not present

## 2021-11-20 DIAGNOSIS — M1990 Unspecified osteoarthritis, unspecified site: Secondary | ICD-10-CM | POA: Diagnosis not present

## 2021-11-21 ENCOUNTER — Other Ambulatory Visit (HOSPITAL_BASED_OUTPATIENT_CLINIC_OR_DEPARTMENT_OTHER): Payer: Self-pay

## 2021-11-26 DIAGNOSIS — R1310 Dysphagia, unspecified: Secondary | ICD-10-CM | POA: Diagnosis not present

## 2021-11-26 DIAGNOSIS — K912 Postsurgical malabsorption, not elsewhere classified: Secondary | ICD-10-CM | POA: Diagnosis not present

## 2021-11-26 DIAGNOSIS — Z452 Encounter for adjustment and management of vascular access device: Secondary | ICD-10-CM | POA: Diagnosis not present

## 2021-11-26 DIAGNOSIS — K2270 Barrett's esophagus without dysplasia: Secondary | ICD-10-CM | POA: Diagnosis not present

## 2021-11-26 DIAGNOSIS — E43 Unspecified severe protein-calorie malnutrition: Secondary | ICD-10-CM | POA: Diagnosis not present

## 2021-11-26 DIAGNOSIS — E1142 Type 2 diabetes mellitus with diabetic polyneuropathy: Secondary | ICD-10-CM | POA: Diagnosis not present

## 2021-11-27 ENCOUNTER — Other Ambulatory Visit (HOSPITAL_BASED_OUTPATIENT_CLINIC_OR_DEPARTMENT_OTHER): Payer: Self-pay

## 2021-11-27 DIAGNOSIS — F32A Depression, unspecified: Secondary | ICD-10-CM | POA: Diagnosis not present

## 2021-11-27 DIAGNOSIS — G894 Chronic pain syndrome: Secondary | ICD-10-CM | POA: Diagnosis not present

## 2021-11-27 DIAGNOSIS — G8929 Other chronic pain: Secondary | ICD-10-CM | POA: Diagnosis not present

## 2021-11-27 DIAGNOSIS — M545 Low back pain, unspecified: Secondary | ICD-10-CM | POA: Diagnosis not present

## 2021-11-27 DIAGNOSIS — M542 Cervicalgia: Secondary | ICD-10-CM | POA: Diagnosis not present

## 2021-11-27 DIAGNOSIS — F419 Anxiety disorder, unspecified: Secondary | ICD-10-CM | POA: Diagnosis not present

## 2021-11-27 DIAGNOSIS — G609 Hereditary and idiopathic neuropathy, unspecified: Secondary | ICD-10-CM | POA: Diagnosis not present

## 2021-11-27 MED ORDER — HYDROCODONE-ACETAMINOPHEN 10-325 MG PO TABS: 1.0000 | ORAL_TABLET | Freq: Every day | ORAL | 0 refills | Status: DC | PRN

## 2021-11-27 MED ORDER — FENTANYL 25 MCG/HR TD PT72
1.0000 | MEDICATED_PATCH | TRANSDERMAL | 0 refills | Status: DC
  Filled 2021-11-30: qty 10, 30d supply, fill #0

## 2021-11-27 NOTE — Progress Notes (Unsigned)
NEUROLOGY FOLLOW UP OFFICE NOTE  TEQUITA MARRS [id number]  Assessment/Plan:   Migraine without aura, without status migrainosus, not intractable Idiopathic polyneuropathy Chronic pain syndrome Subjective memory complaints   To help with increased pain, will increase Lyrica to 175m twice daily.  Continue Cymbalta 346mtwice daily.  Advised not to use the lidocaine patches more than recommended. Migraine prevention:  Emgality Migraine rescue:  sumatriptan She has a therapist.  Advised to establish care with a psychiatrist to address her underlying PTSD and insomnia. Limit use of pain relievers to no more than 2 days out of week to prevent risk of rebound or medication-overuse headache. Keep headache diary Follow up 6 months     Subjective:  KaDenni Frances a 89ear old woman with fibromyalgia, type 2 diabetes, anxiety with history of PTSD, degenerative disc disease of cervical and lumbar spine, and history of nephrolithiasis and gastric bypass who follows up for polyneuropathy, migraine and memory complaints.  She is accompanied by her husband.   UPDATE: Migraine: Taking Emgality, Fioricet and sumatriptan She had a TPN [facility].  Since then she has had worsening migraines.  ***    Neuropathy/Chronic pain Taking Lyrica 10059mID, Cymbalta 28m41mD Had increased Lyrica to 100mg45mce daily.  Memory Complaints She mentioned trouble with short term memory.  Neuropsychological evaluation on 03/23/2021 was within normal limits and likely he memory complaints are related to PTSD, chronic pain and poor sleep.     Current NSAIDS:  flurbiprofen; Celebrex Current analgesics:  Excedrin Migraine, Lidoderm, voltaren 1%, Fentanyl patch Current triptans:  Sumatriptan 100mg 21ment ergotamine:  no Current anti-emetic:  Zofran 8mg Cu63mnt muscle relaxants:  none Current anti-anxiolytic:  clonazepam Current sleep aide:  trazodone Current  Antihypertensive medications:  Lasix Current Antidepressant medications:  Cymbalta 28mg tw41mdaily,  trazodone 25mg inf83mently for sleep Current Anticonvulsant medications:  Lyrica 50mg in A64md 100mg at ni61m Current anti-CGRP:  Emgality Current Vitamins/Herbal/Supplements:  turmeric Current Antihistamines/Decongestants:  no Other therapy:  Physical therapy, Cef[facility]   Caffeine:  2 cups of coffee daily Alcohol:  no Smoker:  no Diet:  poor Exercise:  no Depression:  no; Anxiety:  yes Sleep hygiene:  poor Chronic pain syndrome:  Followed by pain management.  I have her on Lyrica.  She is prescribed Cymbalta by another provider.   HISTORY: I  Neuropathy:  Since July 2018, she has had increased swelling and burning numbness and tingling in the legs.  She had lower extremity vascular ABIs on 08/20/16, which was negative.  She has history of B12 deficiency but she takes injections and recent B12 level from 09/21/16 was over 2000.  Methylmalonic acid level was 209, RPR nonreactive, homocysteine 7.8.  Labs from 07/12/16 include normal SPEP, Sed Rate 2, CRP less than 0.3, and TSH 2.430 .  Vitamin D was 23 and she was advised to start supplementation.  Serum glucose has been 70s up to 110.  She also takes B6.  She also has history of weight loss.  She has fibromyalgia and was previously diagnosed with neuropathy by another neurologist.  She has a spinal cord stimulator.  She has been on gabapentin for many years.  It was briefly discontinued to see if it was contributing to the swelling.  She reported no significant change, so it was restarted (she takes 300mg 4 time63mily), although she thinks it helped a little bit.  NCV-EMG from 10/21/16 demonstrated chronic sensorimotor polyneuropathy of the  predominantly axonal type as well as mild left median neuropathy at or distal to the wrist.  Other labs include B1 146.1, B6 10.7, Sed Rate 2, HIV negative, TSH 1.780, UPEP negative.  She went to [facility] for evaluation  of neuropathy.  Selenium, Zinc, paraneoplastic panel and genetic testing Copper was low, so she was advised to start supplement.  B6 was still elevated despite stopping supplements 3 prior.     She was wondering if her neuropathy could be secondary to [employer] exposure.  Her husband was a [place] veteran and reports cases of spouses exposed to [employer] through their husband's semen.  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  Her neuropathic symptoms started shortly after her return.       II Migraine: Onset:  Since 1970, after her twin newborns passed away.  She has lived with life-long family-related stress Location:  Migraines are unilateral/parietal or bilateral retro-orbital, chronic tension type (bifrontal) Quality:  Severe crushing Initial Intensity:  10/10 Aura:  no Prodrome:  no Associated symptoms:  Photophobia, phonophobia.  No nausea, vomiting or visual disturbance Initial Duration:  Migraines 1 hour with sumatriptan, other headaches 15 minutes with Fioricet Initial Frequency:  Daily (3 to 4 days a month are severe migraine) Triggers/exacerbating factors:  Stress, Nexium Relieving factors:  Clonazepam, Fioricet, sumatriptan Activity:  Cannot function when severe (3-4 days per month)   Past NSAIDS:  Ibuprofen, naproxen Past analgesics:  Cafergot, Excedrin, Tylenol, Tramadol, Fioricet Past abortive triptans:  no Past muscle relaxants:  tizanidine, Flexeril Past anti-emetic:  Zofran ODT 83m Past antihypertensive medications:  Lopressor, propranolol Past antidepressant medications:  Elavil, Effexor, Zoloft Past anticonvulsant medications:  Topiramate, gabapentin 303mtwice daily Past CGRP-inhibitor:  Aimovig 1401mast vitamins/Herbal/Supplements:  CoQ10 Other past therapies:  yoga   Family history of headache:  Father, sister, brother   MRI and MRA of brain from 07/23/11 were personally reviewed and unremarkable.  PAST MEDICAL HISTORY: Past  Medical History:  Diagnosis Date   Adjustment disorder with depressed mood 03/18/2007   Allergy    Anal fissure 10/08/2016   Angina    takes Propanolol prn and it stops her chest   Anorexia nervosa 11/11/2019   Barrett esophagus    not consistently present   Cataract    removed  but had retinal detachment - repeat cataract right eye   Cervical facet joint syndrome 10/08/2016   Chronic constipation 10/04/2010   Chronic interstitial cystitis 10/16/2011   Chronic kidney disease    kidney stones and UTI   Chronic maxillary sinusitis 09/15/2013   Chronic narcotic dependence 09/34/74/2595Complication of anesthesia    either  BP or pulse dropped with last surgery   Degeneration of cervical intervertebral disc 03/18/2007   Degeneration of lumbar or lumbosacral intervertebral disc 03/18/2007   Diaphragmatic hernia 03/18/2007   Dysrhythmia    brought on by stress   Epigastric pain 11/11/2019   Esophageal dysphagia 10/03/2018   External hemorrhoids    Fibromyalgia    neuropathy knees ankles and toes, bladder   Generalized anxiety disorder 03/18/2007   GERD (gastroesophageal reflux disease) 03/18/2007   HA (headache)-chronic/tension type 07/28/2012   Heart murmur    Hiatal hernia    History of anemia    History of [facility] 02/11/2014   [facility]   History of kidney stones    Hypertension    Hypoglycemia 11/08/2016   IBS (irritable bowel  syndrome)    Internal hemorrhoids    LLQ abdominal pain 11/11/2019   Macular degeneration    Malnutrition 11/11/2019   Mechanical breakdown implanted electronic neurostimulator spinal cord 11/16/2018   Mitral regurgitation 12/11/2015   MVP (mitral valve prolapse) 12/17/2017   Myalgia 03/18/2007   Nephrolithiasis 07/28/2012   lithotrip 2013-[place],MD   Neuropathic pain 01/23/2018   Orthostatic hypotension 11/20/2016   Osteoarthritis    all over   Osteopetrosis    Osteoporosis  07/28/2012   Peripheral vascular disease    superficial phlebitis in left calf   Personal history of colonic polyps 07/22/2013   07/2013 - 3 small adenomas - repeat colonoscopy 2018   PTSD (post-traumatic stress disorder)    Rosacea 11/02/2017   Small intestinal bacterial overgrowth 08/05/2017   + lactulose breath test 06/2017 Xifaxan Rx   Type II diabetes mellitus 11/18/2016    MEDICATIONS: Current Outpatient Medications on File Prior to Visit  Medication Sig Dispense Refill   aspirin-acetaminophen-caffeine (EXCEDRIN MIGRAINE) 250-250-65 MG tablet Take 2 tablets by mouth 2 (two) times daily.     Blood Glucose Monitoring Suppl (FREESTYLE FREEDOM LITE) w/Device KIT 1 each by Does not apply route 4 (four) times daily. Use to monitor glucose levels 4 times daily; E11.9 1 kit 0   butalbital-acetaminophen-caffeine (FIORICET) 50-325-40 MG tablet Take 1 tablet by mouth every 6 (six) hours as needed for headache. 10 tablet 0   CALCIUM PO Take by mouth daily.     celecoxib (CELEBREX) 200 MG capsule Take 200 mg by mouth 2 (two) times daily. (Patient not taking: Reported on 09/05/2021)     clonazePAM (KLONOPIN) 0.5 MG tablet Take 0.25 mg by mouth as needed.     COLLAGEN PO Take by mouth daily.     Continuous Blood Gluc Receiver (FREESTYLE LIBRE 14 DAY READER) DEVI 1 each by Does not apply route See admin instructions. Use with sensors to monitor glucose levels; E11.9 1 each 0   Continuous Blood Gluc Sensor (FREESTYLE LIBRE 14 DAY SENSOR) MISC 1 Device by Does not apply route every 14 (fourteen) days. 6 each 3   Copper Gluconate POWD Take by mouth.     cyanocobalamin (,VITAMIN B-12,) 1000 MCG/ML injection SMARTSIG:1 Vial(s) IM Once a Month     cycloSPORINE (RESTASIS) 0.05 % ophthalmic emulsion Place 1 drop into both eyes 2 (two) times daily. 5.5 mL 5   diclofenac sodium (VOLTAREN) 1 % GEL Apply 2 g topically 4 (four) times daily. 700 g 3   dicyclomine (BENTYL) 10 MG capsule Take 1-2 tab 3 times daily AC  as needed for spasms and cramping. 270 capsule 1   Docusate Sodium (DSS) 100 MG CAPS Take by mouth.     DULoxetine (CYMBALTA) 30 MG capsule Take 30 mg by mouth 2 (two) times daily.     Erenumab-aooe 70 MG/ML SOAJ Inject into the skin.     esomeprazole (NEXIUM) 40 MG capsule Take 1 capsule (40 mg total) by mouth daily before breakfast. 90 capsule 3   famotidine (PEPCID) 20 MG tablet Take by mouth.     fentaNYL (DURAGESIC) 25 MCG/HR 1 patch every 3 (three) days.     [START ON 11/30/2021] fentaNYL (DURAGESIC) 25 MCG/HR Apply 1 (one) patch, transdermally every 72 hours. Change every 3 days 10 patch 0   furosemide (LASIX) 40 MG tablet Take by mouth.     Galcanezumab-gnlm (EMGALITY) 120 MG/ML SOAJ Inject 120 mg into the skin every 28 (twenty-eight) days. 3 pen 3   glucagon  1 MG injection Inject 1 mg into the muscle once as needed for up to 1 dose. 1 each 12   glucose blood (FREESTYLE TEST STRIPS) test strip Use to monitor glucose levels 4 times daily; E11.9 200 each 0   HYDROcodone-acetaminophen (NORCO) 10-325 MG tablet Take 1 tablet by mouth every 6 (six) hours as needed for moderate pain. 1/2 tablet as needed     HYDROcodone-acetaminophen (NORCO) 10-325 MG tablet Take 1 (one) tablet by mouth five times daily, if needed for pain, max daily dose: 5 Tablet 150 tablet 0   hydrocortisone cream 1 % Apply topically.     hypromellose (GENTEAL) 0.3 % GEL ophthalmic ointment Place 1 drop into both eyes at bedtime. Reported on 02/11/2015     Lancets (FREESTYLE) lancets Use to monitor glucose levels 4 times daily; E11.9 200 each 0   latanoprost (XALATAN) 0.005 % ophthalmic solution Apply to eye.     lidocaine (LIDODERM) 5 % Place 1 patch onto the skin as needed. Remove & Discard patch within 12 hours. May dispense as 3 month supply 90 patch 3   Lifitegrast 5 % SOLN Place 1 drop into both eyes daily.     meloxicam (MOBIC) 7.5 MG tablet Take 7.5 mg by mouth daily.     metroNIDAZOLE (METROGEL) 1 % gel Apply  topically daily. 45 g 0   mirabegron ER (MYRBETRIQ) 25 MG TB24 tablet Take 1 tablet (25 mg total) by mouth daily. 90 tablet 1   Multiple Vitamins-Minerals (PRESERVISION AREDS) CAPS Take 1 capsule by mouth 2 (two) times daily.      naloxegol oxalate (MOVANTIK) 25 MG TABS tablet Take 25 mg by mouth daily.     Naloxone HCl 0.4 MG/0.4ML SOAJ Administer 0.62m [place] at first sign of opioid overdose and repeat every 2 minutes as needed for resuscitation. Call 911 immediately 5 Package 0[place] Peripheral Neuropathy Cream- Bupivacaine 1%, Doxepin 3%, Gabapentin 6%, Pentoxifylline 3%, Topiramate 1% Apply 1-2 grams to affected area 3-4 times daily Qty. 120 gm 3 refills     NON FORMULARY GABA6%LIDO5%DICL3%     nystatin-triamcinolone (MYCOLOG II) cream      ondansetron (ZOFRAN) 8 MG tablet Take 1 tablet (8 mg total) by mouth every 8 (eight) hours as needed for nausea or vomiting. 60 tablet 0   phenazopyridine (PYRIDIUM) 95 MG tablet Take by mouth.     potassium chloride SA (KLOR-CON M) 20 MEQ tablet Take by mouth.     pregabalin (LYRICA) 50 MG capsule Take 2 capsules (100 mg total) by mouth 2 (two) times daily. 120 capsule 5   Probiotic Product (PROBIOTIC-10 PO) Take by mouth.      sucralfate (CARAFATE) 1 GM/10ML suspension Take 10 mLs (1 g total) by mouth 4 (four) times daily. 420 mL 1   SUMAtriptan (IMITREX) 100 MG tablet Take 1 tablet at earliest onset of headache, may repeat in 2 hours if headache persists or reoccurs. 30 tablet 1   traZODone (DESYREL) 100 MG tablet Take by mouth. (Patient not taking: Reported on 09/05/2021)     Vitamin D, Ergocalciferol, (DRISDOL) 1.25 MG (50000 UT) CAPS capsule Take 1 capsule (50,000 Units total) by mouth every 7 (seven) days. 12 capsule 3   No current facility-administered medications on file prior to visit.    ALLERGIES: Allergies  Allergen Reactions   Ambien [Zolpidem Tartrate]     Hallucinations   Codeine Anaphylaxis    Oxycodone-Acetaminophen Shortness Of Breath   Tylox [Oxycodone-Acetaminophen]  Anaphylaxis    Chest pain   Darvocet [Propoxyphene N-Acetaminophen]     Chest pain   Darvon [Propoxyphene] Itching   Lorcet [Hydrocodone-Acetaminophen]     Chest pain   Opium     hallucinations   Vicodin [Hydrocodone-Acetaminophen] Other (See Comments)    Chest Pain    Wheat Bran    Amoxicillin Nausea And Vomiting    Reaction:  Migraine headache   Penicillins Nausea And Vomiting    Migraine Headaches    FAMILY HISTORY: Family History  Problem Relation Age of Onset   Stroke Mother    Lung cancer Mother    Heart disease Mother    Diabetes Mother    Hypertension Father    Stroke Father    Heart disease Father    Kidney disease Father    Seizures Father        epilepsy, and sisters x 2   Memory loss Father    Lung cancer Sister    Breast cancer Sister    Cancer - Lung Sister    Heart disease Sister    Diabetes Sister    Pancreatic cancer Sister    Healthy Daughter    Healthy Son    Colon cancer Maternal Aunt        12 relatives   Colon cancer Paternal Aunt    Colon cancer Paternal Aunt    Uterine cancer Other        aunt   Heart disease Other        grandmother   Esophageal cancer Neg Hx    Rectal cancer Neg Hx    Stomach cancer Neg Hx    Colon polyps Neg Hx       Objective:  *** General: No acute distress.  Patient appears well-groomed.   Head:  Normocephalic/atraumatic Eyes:  Fundi examined but not visualized Neck: supple, no paraspinal tenderness, full range of motion Heart:  Regular rate and rhythm Neurological Exam: alert and oriented to person, place, and time.  Speech fluent and not dysarthric, language intact.  CN II-XII intact. Bulk and tone normal, muscle strength 5/5 throughout.  Sensation to pinprick reduced in feet up to ankles, sensation to vibration reduced up to below knees.  Deep tendon reflexes absent throughout, toes downgoing.  Finger to nose testing intact.   Cautious broad-based gait.  Romberg positive    Metta Clines, DO  CC: Rachell Cipro, MD

## 2021-11-28 ENCOUNTER — Ambulatory Visit (INDEPENDENT_AMBULATORY_CARE_PROVIDER_SITE_OTHER): Payer: Medicare Other | Admitting: Neurology

## 2021-11-28 ENCOUNTER — Encounter: Payer: Self-pay | Admitting: Neurology

## 2021-11-28 VITALS — BP 108/65 | HR 98 | Ht 65.0 in | Wt 115.4 lb

## 2021-11-28 DIAGNOSIS — G894 Chronic pain syndrome: Secondary | ICD-10-CM | POA: Diagnosis not present

## 2021-11-28 DIAGNOSIS — G609 Hereditary and idiopathic neuropathy, unspecified: Secondary | ICD-10-CM | POA: Diagnosis not present

## 2021-11-28 DIAGNOSIS — G43009 Migraine without aura, not intractable, without status migrainosus: Secondary | ICD-10-CM

## 2021-11-28 DIAGNOSIS — R4189 Other symptoms and signs involving cognitive functions and awareness: Secondary | ICD-10-CM | POA: Diagnosis not present

## 2021-11-28 MED ORDER — PREGABALIN 50 MG PO CAPS: ORAL_CAPSULE | ORAL | 5 refills | Status: DC

## 2021-11-28 NOTE — Patient Instructions (Signed)
At this time, we won't make any medication changes - Emgality - Lyrica '50mg'$  in morning and '100mg'$  at night - Fioricet or sumatriptan - limit Fioricet to no more than 1 day a week and sumatriptan no more than 1 day a week (2 days maximum) - Follow up 3 months

## 2021-11-29 DIAGNOSIS — Z452 Encounter for adjustment and management of vascular access device: Secondary | ICD-10-CM | POA: Diagnosis not present

## 2021-11-29 DIAGNOSIS — K5909 Other constipation: Secondary | ICD-10-CM | POA: Diagnosis not present

## 2021-11-29 DIAGNOSIS — M797 Fibromyalgia: Secondary | ICD-10-CM | POA: Diagnosis not present

## 2021-11-29 DIAGNOSIS — F339 Major depressive disorder, recurrent, unspecified: Secondary | ICD-10-CM | POA: Diagnosis not present

## 2021-11-29 DIAGNOSIS — E559 Vitamin D deficiency, unspecified: Secondary | ICD-10-CM | POA: Diagnosis not present

## 2021-11-29 DIAGNOSIS — E43 Unspecified severe protein-calorie malnutrition: Secondary | ICD-10-CM | POA: Diagnosis not present

## 2021-11-29 DIAGNOSIS — R1310 Dysphagia, unspecified: Secondary | ICD-10-CM | POA: Diagnosis not present

## 2021-11-29 DIAGNOSIS — I340 Nonrheumatic mitral (valve) insufficiency: Secondary | ICD-10-CM | POA: Diagnosis not present

## 2021-11-29 DIAGNOSIS — H3530 Unspecified macular degeneration: Secondary | ICD-10-CM | POA: Diagnosis not present

## 2021-11-29 DIAGNOSIS — D631 Anemia in chronic kidney disease: Secondary | ICD-10-CM | POA: Diagnosis not present

## 2021-11-29 DIAGNOSIS — N200 Calculus of kidney: Secondary | ICD-10-CM | POA: Diagnosis not present

## 2021-11-29 DIAGNOSIS — D509 Iron deficiency anemia, unspecified: Secondary | ICD-10-CM | POA: Diagnosis not present

## 2021-11-29 DIAGNOSIS — Z9181 History of falling: Secondary | ICD-10-CM | POA: Diagnosis not present

## 2021-11-29 DIAGNOSIS — E1122 Type 2 diabetes mellitus with diabetic chronic kidney disease: Secondary | ICD-10-CM | POA: Diagnosis not present

## 2021-11-29 DIAGNOSIS — G43909 Migraine, unspecified, not intractable, without status migrainosus: Secondary | ICD-10-CM | POA: Diagnosis not present

## 2021-11-29 DIAGNOSIS — I341 Nonrheumatic mitral (valve) prolapse: Secondary | ICD-10-CM | POA: Diagnosis not present

## 2021-11-29 DIAGNOSIS — K912 Postsurgical malabsorption, not elsewhere classified: Secondary | ICD-10-CM | POA: Diagnosis not present

## 2021-11-29 DIAGNOSIS — G8929 Other chronic pain: Secondary | ICD-10-CM | POA: Diagnosis not present

## 2021-11-29 DIAGNOSIS — E1142 Type 2 diabetes mellitus with diabetic polyneuropathy: Secondary | ICD-10-CM | POA: Diagnosis not present

## 2021-11-29 DIAGNOSIS — R413 Other amnesia: Secondary | ICD-10-CM | POA: Diagnosis not present

## 2021-11-29 DIAGNOSIS — Z8711 Personal history of peptic ulcer disease: Secondary | ICD-10-CM | POA: Diagnosis not present

## 2021-11-29 DIAGNOSIS — N189 Chronic kidney disease, unspecified: Secondary | ICD-10-CM | POA: Diagnosis not present

## 2021-11-29 DIAGNOSIS — K2270 Barrett's esophagus without dysplasia: Secondary | ICD-10-CM | POA: Diagnosis not present

## 2021-11-29 DIAGNOSIS — F411 Generalized anxiety disorder: Secondary | ICD-10-CM | POA: Diagnosis not present

## 2021-11-29 DIAGNOSIS — M810 Age-related osteoporosis without current pathological fracture: Secondary | ICD-10-CM | POA: Diagnosis not present

## 2021-11-30 ENCOUNTER — Inpatient Hospital Stay (HOSPITAL_BASED_OUTPATIENT_CLINIC_OR_DEPARTMENT_OTHER): Payer: Medicare Other | Admitting: Hematology and Oncology

## 2021-11-30 ENCOUNTER — Other Ambulatory Visit (HOSPITAL_BASED_OUTPATIENT_CLINIC_OR_DEPARTMENT_OTHER): Payer: Self-pay

## 2021-11-30 ENCOUNTER — Inpatient Hospital Stay: Payer: Medicare Other | Attending: Hematology and Oncology

## 2021-11-30 ENCOUNTER — Telehealth: Payer: Self-pay | Admitting: *Deleted

## 2021-11-30 VITALS — BP 116/65 | HR 93 | Temp 98.3°F | Resp 16 | Wt 114.0 lb

## 2021-11-30 DIAGNOSIS — D508 Other iron deficiency anemias: Secondary | ICD-10-CM | POA: Diagnosis not present

## 2021-11-30 DIAGNOSIS — D509 Iron deficiency anemia, unspecified: Secondary | ICD-10-CM | POA: Diagnosis not present

## 2021-11-30 LAB — CMP (CANCER CENTER ONLY)
ALT: 29 U/L (ref 0–44)
AST: 34 U/L (ref 15–41)
Albumin: 3.7 g/dL (ref 3.5–5.0)
Alkaline Phosphatase: 50 U/L (ref 38–126)
Anion gap: 3 — ABNORMAL LOW (ref 5–15)
BUN: 41 mg/dL — ABNORMAL HIGH (ref 8–23)
CO2: 34 mmol/L — ABNORMAL HIGH (ref 22–32)
Calcium: 9.1 mg/dL (ref 8.9–10.3)
Chloride: 105 mmol/L (ref 98–111)
Creatinine: 0.57 mg/dL (ref 0.44–1.00)
GFR, Estimated: >60 mL/min (ref >60)
Glucose, Bld: 111 mg/dL — ABNORMAL HIGH (ref 70–99)
Potassium: 4.2 mmol/L (ref 3.5–5.1)
Sodium: 142 mmol/L (ref 135–145)
Total Bilirubin: 0.3 mg/dL (ref 0.3–1.2)
Total Protein: 6 g/dL — ABNORMAL LOW (ref 6.5–8.1)

## 2021-11-30 LAB — CBC WITH DIFFERENTIAL (CANCER CENTER ONLY)
Abs Immature Granulocytes: 0.01 10*3/uL (ref 0.00–0.07)
Basophils Absolute: 0 10*3/uL (ref 0.0–0.1)
Basophils Relative: 0 %
Eosinophils Absolute: 0.6 10*3/uL — ABNORMAL HIGH (ref 0.0–0.5)
Eosinophils Relative: 9 %
HCT: 35.1 % — ABNORMAL LOW (ref 36.0–46.0)
Hemoglobin: 11.5 g/dL — ABNORMAL LOW (ref 12.0–15.0)
Immature Granulocytes: 0 %
Lymphocytes Relative: 25 %
Lymphs Abs: 1.8 10*3/uL (ref 0.7–4.0)
MCH: 31.1 pg (ref 26.0–34.0)
MCHC: 32.8 g/dL (ref 30.0–36.0)
MCV: 94.9 fL (ref 80.0–100.0)
Monocytes Absolute: 0.6 10*3/uL (ref 0.1–1.0)
Monocytes Relative: 8 %
Neutro Abs: 4.2 10*3/uL (ref 1.7–7.7)
Neutrophils Relative %: 58 %
Platelet Count: 268 10*3/uL (ref 150–400)
RBC: 3.7 MIL/uL — ABNORMAL LOW (ref 3.87–5.11)
RDW: 12.1 % (ref 11.5–15.5)
WBC Count: 7.3 10*3/uL (ref 4.0–10.5)
nRBC: 0 % (ref 0.0–0.2)

## 2021-11-30 LAB — IRON AND IRON BINDING CAPACITY (CC-WL,HP ONLY)
Iron: 46 ug/dL (ref 28–170)
Saturation Ratios: 10 % — ABNORMAL LOW (ref 10.4–31.8)
TIBC: 451 ug/dL — ABNORMAL HIGH (ref 250–450)
UIBC: 405 ug/dL (ref 148–442)

## 2021-11-30 LAB — RETIC PANEL
Immature Retic Fract: 19.1 % — ABNORMAL HIGH (ref 2.3–15.9)
RBC.: 3.7 MIL/uL — ABNORMAL LOW (ref 3.87–5.11)
Retic Count, Absolute: 67 10*3/uL (ref 19.0–186.0)
Retic Ct Pct: 1.8 % (ref 0.4–3.1)
Reticulocyte Hemoglobin: 31 pg (ref >27.9)

## 2021-11-30 LAB — FERRITIN: Ferritin: 5 ng/mL — ABNORMAL LOW (ref 11–307)

## 2021-11-30 NOTE — Progress Notes (Signed)
[place] [phone]   [phone]  PROGRESS NOTE  Patient Care Team: Bernice as PCP - General Nahser, Wonda Cheng, MD as PCP - Cardiology (Cardiology) Rainwater, Willey Blade, PA-C as Physician Assistant (Physician Assistant) Felipa Eth, Reece Leader., MD as Referring Physician (Urology) Calvert Cantor, MD as Consulting Physician (Ophthalmology) Gatha Mayer, MD as Consulting Physician (Gastroenterology) Pieter Partridge, DO as Consulting Physician (Neurology) Bo Merino, MD as Consulting Physician (Rheumatology) Yaakov Guthrie, PsyD (Inactive) (Psychiatry) Richard Miu, DMD (Dentistry) Jola Baptist, [place] as Referring Physician (Chiropractic Medicine) Kennith Center, RD as Dietitian (Family Medicine) Kennith Center, RD as Dietitian (Family Medicine)  Hematological/Oncological History # Iron Deficiency Anemia 2/2 to Gastric Bypass 12/04/2020: WBC 6.6, Hgb 11.0, MCV 86.9, Plt 293 03/26/2021: WBC 5.9, Hgb 10.3, MCV 86.3, Plt 306, ferritin 8.2 04/18/2021: establish care with Dr. Lorenso Courier  04/19/2021: Received 1 dose of IV Monoferric 1000 mg 05/30/2021: WBC 7.5, Hgb 12.7, MCV 89.6, Plt 264  Interval History:  Kayla [name] 76 y.o. female with medical history significant for iron deficiency anemia secondary to gastric bypass who presents for a follow up visit. The patient's last visit was on 05/30/2021 at which time she established care. In the interim since the last visit she has started on TPN therapy with [facility].   On exam today Kayla [name] reports her goal is currently to reach 125 pounds.  Remarkably her weight has increased to 114 pounds up from 103.  She is currently receiving TPN and has been doing this for 2 to 3 months.  She reports that she does wake up feeling exhausted, wiped out, and tired.  She notes that the TPN has been helping with the symptoms.  She notes that there will be a revision done of her  Roux-en-Y once she reaches her target weight.  Overall her labs appear improved with a hemoglobin increasing to 12.7 at her last check, however today hemoglobin was 11.5.  She has been taking in some by mouth.  She currently denies any fevers, chills, sweats, nausea, or diarrhea.  A full 10 point ROS is listed below.  MEDICAL HISTORY:  Past Medical History:  Diagnosis Date   Adjustment disorder with depressed mood 03/18/2007   Allergy    Anal fissure 10/08/2016   Angina    takes Propanolol prn and it stops her chest   Anorexia nervosa 11/11/2019   Barrett esophagus    not consistently present   Cataract    removed  but had retinal detachment - repeat cataract right eye   Cervical facet joint syndrome 10/08/2016   Chronic constipation 10/04/2010   Chronic interstitial cystitis 10/16/2011   Chronic kidney disease    kidney stones and UTI   Chronic maxillary sinusitis 09/15/2013   Chronic narcotic dependence 95/09/3265   Complication of anesthesia    either  BP or pulse dropped with last surgery   Degeneration of cervical intervertebral disc 03/18/2007   Degeneration of lumbar or lumbosacral intervertebral disc 03/18/2007   Diaphragmatic hernia 03/18/2007   Dysrhythmia    brought on by stress   Epigastric pain 11/11/2019   Esophageal dysphagia 10/03/2018   External hemorrhoids    Fibromyalgia    neuropathy knees ankles and toes, bladder   Generalized anxiety disorder 03/18/2007   GERD (gastroesophageal reflux disease) 03/18/2007   HA (headache)-chronic/tension type 07/28/2012   Heart murmur    Hiatal hernia    History of anemia    History  of gastric bypass 02/11/2014   1999 at [facility]   History of hyperparathyroidism 01/22/2016   History of kidney stones    Hypertension    Hypoglycemia 11/08/2016   IBS (irritable bowel syndrome)    Internal hemorrhoids    LLQ abdominal pain 11/11/2019   Macular degeneration    Malnutrition 11/11/2019   Mechanical breakdown  implanted electronic neurostimulator spinal cord 11/16/2018   Mitral regurgitation 12/11/2015   MVP (mitral valve prolapse) 12/17/2017   Myalgia 03/18/2007   Nephrolithiasis 07/28/2012   lithotrip 2013-[place],MD   Neuropathic pain 01/23/2018   Orthostatic hypotension 11/20/2016   Osteoarthritis    all over   Osteopetrosis    Osteoporosis 07/28/2012   Peripheral vascular disease    superficial phlebitis in left calf   Personal history of colonic polyps 07/22/2013   07/2013 - 3 small adenomas - repeat colonoscopy 2018   PTSD (post-traumatic stress disorder)    Rosacea 11/02/2017   Small intestinal bacterial overgrowth 08/05/2017   + lactulose breath test 06/2017 Xifaxan Rx   Type II diabetes mellitus 11/18/2016    SURGICAL HISTORY: Past Surgical History:  Procedure Laterality Date   ABDOMINAL HYSTERECTOMY     APPENDECTOMY     CARDIAC CATHETERIZATION  03/22/1991   EF 61%   CARDIOVASCULAR STRESS TEST  10/03/2006   CHOLECYSTECTOMY     COLONOSCOPY  06/23/2008   normal   DILATION AND CURETTAGE OF UTERUS     x2   ESOPHAGUS SURGERY     EXPLORATORY LAPAROTOMY  1993   EYE SURGERY     GASTRIC BYPASS  1999   GASTRIC RESTRICTION SURGERY  1991   LASIK Bilateral    Right Arm Surgery     Right Knee Arthroscopy     Rt wrist fx  2009   spinal surgery battery implant     stomach stappeling  1991   STONE EXTRACTION WITH BASKET  2012   TONSILLECTOMY     TUBAL LIGATION     UPPER GASTROINTESTINAL ENDOSCOPY  06/23/2008   w/Dil, Barrett's esophagus   US ECHOCARDIOGRAPHY  01/21/2007   EF 55-60%   US ECHOCARDIOGRAPHY  08/30/2003   EF 55-60%    SOCIAL HISTORY: Social History   Socioeconomic History   Marital status: Married    Spouse name: Rushie Chestnut.   Number of children: 2   Years of education: 15   Highest education level: Some college, no degree  Occupational History   Occupation: Disability    Employer: RETIRED  Tobacco Use   Smoking status: Never    Passive exposure: Past    Smokeless tobacco: Never  Vaping Use   Vaping Use: Never used  Substance and Sexual Activity   Alcohol use: No   Drug use: No   Sexual activity: Not Currently  Other Topics Concern   Not on file  Social History Narrative   Right handed   Two story home   She retired on disability   Married   2 Children   Social Determinants of Radio broadcast assistant Strain: Not on file  Food Insecurity: Not on file  Transportation Needs: Not on file  Physical Activity: Not on file  Stress: Not on file  Social Connections: Not on file  Intimate Partner Violence: Not on file    FAMILY HISTORY: Family History  Problem Relation Age of Onset   Stroke Mother    Lung cancer Mother    Heart disease Mother    Diabetes Mother  Hypertension Father    Stroke Father    Heart disease Father    Kidney disease Father    Seizures Father        epilepsy, and sisters x 2   Memory loss Father    Lung cancer Sister    Breast cancer Sister    Cancer - Lung Sister    Heart disease Sister    Diabetes Sister    Pancreatic cancer Sister    Healthy Daughter    Healthy Son    Colon cancer Maternal Aunt        12 relatives   Colon cancer Paternal Aunt    Colon cancer Paternal Aunt    Uterine cancer Other        aunt   Heart disease Other        grandmother   Esophageal cancer Neg Hx    Rectal cancer Neg Hx    Stomach cancer Neg Hx    Colon polyps Neg Hx     ALLERGIES:  is allergic to [employer] tartrate], codeine, oxycodone-acetaminophen, tylox [oxycodone-acetaminophen], darvocet [propoxyphene n-acetaminophen], darvon [propoxyphene], lorcet [hydrocodone-acetaminophen], opium, vicodin [hydrocodone-acetaminophen], wheat bran, amoxicillin, and penicillins.  MEDICATIONS:  Current Outpatient Medications  Medication Sig Dispense Refill   Cholecalciferol 1.25 MG (50000 UT) capsule Take by mouth.     cyclobenzaprine (FLEXERIL) 10 MG tablet Take by mouth.     magnesium hydroxide (MILK OF  MAGNESIA) 400 MG/5ML suspension Take by mouth.     pantoprazole (PROTONIX) 40 MG tablet Take by mouth.     polyethylene glycol (MIRALAX / GLYCOLAX) 17 g packet Take by mouth.     senna-docusate (SENOKOT-S) 8.6-50 MG tablet Take by mouth.     sulfamethoxazole-trimethoprim (BACTRIM DS) 800-160 MG tablet Take 1 tablet by mouth 2 (two) times daily.     traZODone (DESYREL) 50 MG tablet Take 50 mg by mouth at bedtime.     aspirin-acetaminophen-caffeine (EXCEDRIN MIGRAINE) 250-250-65 MG tablet Take 2 tablets by mouth 2 (two) times daily.     Biotin 1000 MCG CHEW Chew 1 tablet by mouth daily.     butalbital-acetaminophen-caffeine (FIORICET) 50-325-40 MG tablet Take 1 tablet by mouth every 6 (six) hours as needed for headache. 10 tablet 0   CALCIUM PO Take by mouth daily.     celecoxib (CELEBREX) 200 MG capsule Take 200 mg by mouth 2 (two) times daily.     clonazePAM (KLONOPIN) 0.5 MG tablet Take 0.25 mg by mouth as needed.     COLLAGEN PO Take by mouth daily.     Continuous Blood Gluc Receiver ([place]) DEVI by Does not apply route.     Copper Gluconate POWD Take by mouth.     cyanocobalamin (,VITAMIN B-12,) 1000 MCG/ML injection SMARTSIG:1 Vial(s) IM Once a Month     cycloSPORINE (RESTASIS) 0.05 % ophthalmic emulsion Place 1 drop into both eyes 2 (two) times daily. 5.5 mL 5   diclofenac sodium (VOLTAREN) 1 % GEL Apply 2 g topically 4 (four) times daily. 700 g 3   dicyclomine (BENTYL) 10 MG capsule Take 1-2 tab 3 times daily AC as needed for spasms and cramping. 270 capsule 1   Docusate Sodium (DSS) 100 MG CAPS Take by mouth.     Erenumab-aooe 70 MG/ML SOAJ Inject into the skin.     famotidine (PEPCID) 20 MG tablet Take by mouth.     fentaNYL (DURAGESIC) 25 MCG/HR Place 1 patch onto the skin every 3 (three) days. 10 patch 0  furosemide (LASIX) 40 MG tablet Take by mouth.     Galcanezumab-gnlm (EMGALITY) 120 MG/ML SOAJ Inject 120 mg into the skin every 28 (twenty-eight) days. 3 pen 3    glucagon 1 MG injection Inject 1 mg into the muscle once as needed for up to 1 dose. 1 each 12   HYDROcodone-acetaminophen (NORCO) 10-325 MG tablet Take 1 tablet by mouth 5 (five) times daily as needed for pain. Max daily dose: 5 tablets. 150 tablet 0   hydrocortisone cream 1 % Apply topically.     hypromellose (GENTEAL) 0.3 % GEL ophthalmic ointment Place 1 drop into both eyes at bedtime. Reported on 02/11/2015     latanoprost (XALATAN) 0.005 % ophthalmic solution Apply to eye.     lidocaine (LIDODERM) 5 % Place 1 patch onto the skin as needed. Remove & Discard patch within 12 hours. May dispense as 3 month supply 90 patch 3   Lifitegrast 5 % SOLN Place 1 drop into both eyes daily.     metroNIDAZOLE (METROGEL) 1 % gel Apply topically daily. 45 g 0   mirabegron ER (MYRBETRIQ) 25 MG TB24 tablet Take 1 tablet (25 mg total) by mouth daily. 90 tablet 1   Multiple Vitamins-Minerals (PRESERVISION AREDS) CAPS Take 1 capsule by mouth 2 (two) times daily.      naloxegol oxalate (MOVANTIK) 25 MG TABS tablet Take 25 mg by mouth daily.     Naloxone HCl 0.4 MG/0.4ML SOAJ Administer 0.'4mg'$  [place] at first sign of opioid overdose and repeat every 2 minutes as needed for resuscitation. Call 911 immediately 5 Package [place]  Peripheral Neuropathy Cream- Bupivacaine 1%, Doxepin 3%, Gabapentin 6%, Pentoxifylline 3%, Topiramate 1% Apply 1-2 grams to affected area 3-4 times daily Qty. 120 gm 3 refills     NON FORMULARY GABA6%LIDO5%DICL3%     nystatin-triamcinolone (MYCOLOG II) cream      ondansetron (ZOFRAN) 8 MG tablet Take 1 tablet (8 mg total) by mouth every 8 (eight) hours as needed for nausea or vomiting. 60 tablet 0   phenazopyridine (PYRIDIUM) 95 MG tablet Take by mouth.     potassium chloride SA (KLOR-CON M) 20 MEQ tablet Take by mouth.     pregabalin (LYRICA) 50 MG capsule Take 1 capsule in morning and 2 capsules at night 90 capsule 5   Probiotic Product (PROBIOTIC-10 PO) Take by  mouth.      sucralfate (CARAFATE) 1 GM/10ML suspension Take 10 mLs (1 g total) by mouth 4 (four) times daily. 420 mL 1   SUMAtriptan (IMITREX) 100 MG tablet Take 1 tablet at earliest onset of headache, may repeat in 2 hours if headache persists or reoccurs. 30 tablet 1   No current facility-administered medications for this visit.    REVIEW OF SYSTEMS:   Constitutional: ( - ) fevers, ( - )  chills , ( - ) night sweats Eyes: ( - ) blurriness of vision, ( - ) double vision, ( - ) watery eyes Ears, nose, mouth, throat, and face: ( - ) mucositis, ( - ) sore throat Respiratory: ( - ) cough, ( - ) dyspnea, ( - ) wheezes Cardiovascular: ( - ) palpitation, ( - ) chest discomfort, ( - ) lower extremity swelling Gastrointestinal:  ( - ) nausea, ( - ) heartburn, ( - ) change in bowel habits Skin: ( - ) abnormal skin rashes Lymphatics: ( - ) new lymphadenopathy, ( - ) easy bruising Neurological: ( - ) numbness, ( - )  tingling, ( - ) new weaknesses Behavioral/Psych: ( - ) mood change, ( - ) new changes  All other systems were reviewed with the patient and are negative.  PHYSICAL EXAMINATION:  Vitals:   11/30/21 1023  BP: 116/65  Pulse: 93  Resp: 16  Temp: 98.3 F (36.8 C)  SpO2: 100%   Filed Weights   11/30/21 1023  Weight: 114 lb (51.7 kg)    GENERAL: Cachectic appearing elderly Caucasian female, no distress and comfortable SKIN: skin color, texture, turgor are normal, no rashes or significant lesions EYES: conjunctiva are pink and non-injected, sclera clear LUNGS: clear to auscultation and percussion with normal breathing effort HEART: regular rate & rhythm and no murmurs and no lower extremity edema Musculoskeletal: no cyanosis of digits and no clubbing  PSYCH: alert & oriented x 3, fluent speech NEURO: no focal motor/sensory deficits  LABORATORY DATA:  I have reviewed the data as listed    Latest Ref Rng & Units 11/30/2021    9:59 AM 09/03/2021    9:52 AM 07/19/2021    1:50 PM   CBC  WBC 4.0 - 10.5 K/uL 7.3  5.9  7.9   Hemoglobin 12.0 - 15.0 g/dL 11.5  12.2  12.2   Hematocrit 36.0 - 46.0 % 35.1  36.1  36.6   Platelets 150 - 400 K/uL 268  240  245        Latest Ref Rng & Units 11/30/2021    9:59 AM 09/03/2021    9:52 AM 07/19/2021    1:50 PM  CMP  Glucose 70 - 99 mg/dL 111  97  180   BUN 8 - 23 mg/dL 41  23  23   Creatinine 0.44 - 1.00 mg/dL 0.57  0.60  0.72   Sodium 135 - 145 mmol/L 142  140  140   Potassium 3.5 - 5.1 mmol/L 4.2  4.5  5.0   Chloride 98 - 111 mmol/L 105  105  105   CO2 22 - 32 mmol/L 34  32  30   Calcium 8.9 - 10.3 mg/dL 9.1  9.1  9.4   Total Protein 6.5 - 8.1 g/dL 6.0  5.0  5.0   Total Bilirubin 0.3 - 1.2 mg/dL 0.3  0.3  0.3   Alkaline Phos 38 - 126 U/L 50  42    AST 15 - 41 U/L 34  26  40   ALT 0 - 44 U/L '29  19  29     '$ Lab Results  Component Value Date   MPROTEIN Not Observed 04/03/2017    RADIOGRAPHIC STUDIES: No results found.  ASSESSMENT & PLAN Kayla [name] 76 y.o. female with medical history significant for iron deficiency anemia secondary to gastric bypass who presents for a follow up visit.  # Iron Deficiency Anemia in the Setting of Gastric Bypass -- Patients with gastric bypass have difficulty absorbing iron which is a common cause of iron deficiency in this population. --Today we will repeat labs to include CBC, CMP, iron panel, and ferritin --patient has normal vitamin B12 and methylmalonic acid levels.  This tends to be decreased in patients with gastric bypass. Will continue to monitor moving forward. --Labs today show hemoglobin 11.5, WBC 7.3, MCV 94.9, Plt 268 --Patient received 1 dose of IV monoferric 1000 mg on 04/19/2021.  Will plan to administer another dose if iron levels are persistently low today. --return to clinic in 6 months time to reevaluate  No orders of the defined types were  placed in this encounter.   All questions were answered. The patient knows to call the clinic with any problems,  questions or concerns.  A total of more than 30 minutes were spent on this encounter with face-to-face time and non-face-to-face time, including preparing to see the patient, ordering tests and/or medications, counseling the patient and coordination of care as outlined above.   Ledell Peoples, MD Department of Hematology/Oncology [place] [facility] Phone: [phone] Pager: [phone] Email: [email or web address]  12/09/2021 6:11 PM

## 2021-11-30 NOTE — Telephone Encounter (Signed)
TCT patient regarding today's lab results.  Spoke with her. Advised that she has significant iron deficiency. Advised that Dr. Lorenso Courier will be ordering IV iron and to expect as call next week from the [employer]. She voiced understanding.

## 2021-11-30 NOTE — Telephone Encounter (Signed)
-----   Message from Orson Slick, MD sent at 11/30/2021  2:58 PM EST ----- Please let Kayla [name] know that her labs are consistent with marked iron deficiency. I will set her up for an IV iron infusion to help boost her levels.   ----- Message ----- From: Buel Ream, Lab In [place] Sent: 11/30/2021  10:21 AM EST To: Orson Slick, MD

## 2021-12-03 ENCOUNTER — Telehealth: Payer: Self-pay | Admitting: Hematology and Oncology

## 2021-12-03 DIAGNOSIS — E43 Unspecified severe protein-calorie malnutrition: Secondary | ICD-10-CM | POA: Diagnosis not present

## 2021-12-03 DIAGNOSIS — Z452 Encounter for adjustment and management of vascular access device: Secondary | ICD-10-CM | POA: Diagnosis not present

## 2021-12-03 DIAGNOSIS — K2270 Barrett's esophagus without dysplasia: Secondary | ICD-10-CM | POA: Diagnosis not present

## 2021-12-03 DIAGNOSIS — R1310 Dysphagia, unspecified: Secondary | ICD-10-CM | POA: Diagnosis not present

## 2021-12-03 DIAGNOSIS — K912 Postsurgical malabsorption, not elsewhere classified: Secondary | ICD-10-CM | POA: Diagnosis not present

## 2021-12-03 DIAGNOSIS — E1142 Type 2 diabetes mellitus with diabetic polyneuropathy: Secondary | ICD-10-CM | POA: Diagnosis not present

## 2021-12-03 NOTE — Telephone Encounter (Signed)
Per 11/17 los called and spoke to pt about appointment

## 2021-12-09 ENCOUNTER — Encounter: Payer: Self-pay | Admitting: Hematology and Oncology

## 2021-12-10 ENCOUNTER — Other Ambulatory Visit (HOSPITAL_BASED_OUTPATIENT_CLINIC_OR_DEPARTMENT_OTHER): Payer: Self-pay

## 2021-12-10 DIAGNOSIS — M542 Cervicalgia: Secondary | ICD-10-CM | POA: Diagnosis not present

## 2021-12-10 DIAGNOSIS — R1310 Dysphagia, unspecified: Secondary | ICD-10-CM | POA: Diagnosis not present

## 2021-12-10 DIAGNOSIS — G609 Hereditary and idiopathic neuropathy, unspecified: Secondary | ICD-10-CM | POA: Diagnosis not present

## 2021-12-10 DIAGNOSIS — F32A Depression, unspecified: Secondary | ICD-10-CM | POA: Diagnosis not present

## 2021-12-10 DIAGNOSIS — Z452 Encounter for adjustment and management of vascular access device: Secondary | ICD-10-CM | POA: Diagnosis not present

## 2021-12-10 DIAGNOSIS — M545 Low back pain, unspecified: Secondary | ICD-10-CM | POA: Diagnosis not present

## 2021-12-10 DIAGNOSIS — E43 Unspecified severe protein-calorie malnutrition: Secondary | ICD-10-CM | POA: Diagnosis not present

## 2021-12-10 DIAGNOSIS — K2270 Barrett's esophagus without dysplasia: Secondary | ICD-10-CM | POA: Diagnosis not present

## 2021-12-10 DIAGNOSIS — G894 Chronic pain syndrome: Secondary | ICD-10-CM | POA: Diagnosis not present

## 2021-12-10 DIAGNOSIS — G8929 Other chronic pain: Secondary | ICD-10-CM | POA: Diagnosis not present

## 2021-12-10 DIAGNOSIS — F419 Anxiety disorder, unspecified: Secondary | ICD-10-CM | POA: Diagnosis not present

## 2021-12-10 DIAGNOSIS — E1142 Type 2 diabetes mellitus with diabetic polyneuropathy: Secondary | ICD-10-CM | POA: Diagnosis not present

## 2021-12-10 DIAGNOSIS — K912 Postsurgical malabsorption, not elsewhere classified: Secondary | ICD-10-CM | POA: Diagnosis not present

## 2021-12-10 MED ORDER — HYDROCODONE-ACETAMINOPHEN 10-325 MG PO TABS
0.5000 | ORAL_TABLET | Freq: Four times a day (QID) | ORAL | 0 refills | Status: DC | PRN
  Filled 2021-12-11: qty 120, 30d supply, fill #0

## 2021-12-11 ENCOUNTER — Telehealth: Payer: Self-pay | Admitting: Pharmacy Technician

## 2021-12-11 ENCOUNTER — Other Ambulatory Visit (HOSPITAL_BASED_OUTPATIENT_CLINIC_OR_DEPARTMENT_OTHER): Payer: Self-pay

## 2021-12-11 NOTE — Telephone Encounter (Addendum)
Dr. Lorenso Courier, Juluis Rainier note:  Auth Submission: NO AUTH NEEDED Payer: medicare a/b / champ va Medication & CPT/J Code(s) submitted: Monoferric (Ferrci derisomaltose) [phone] Route of submission (phone, fax, portal):  Phone # Fax # Auth type: Buy/Bill Units/visits requested: x1 Reference number: 21mdicare: 3[employer]: [id number] Approval from: 12/11/21 to 01/13/22   Patient will be scheduled as soon as possible

## 2021-12-13 DIAGNOSIS — Z9884 Bariatric surgery status: Secondary | ICD-10-CM | POA: Diagnosis not present

## 2021-12-13 DIAGNOSIS — M5030 Other cervical disc degeneration, unspecified cervical region: Secondary | ICD-10-CM | POA: Diagnosis not present

## 2021-12-13 DIAGNOSIS — G894 Chronic pain syndrome: Secondary | ICD-10-CM | POA: Diagnosis not present

## 2021-12-13 DIAGNOSIS — E1142 Type 2 diabetes mellitus with diabetic polyneuropathy: Secondary | ICD-10-CM | POA: Diagnosis not present

## 2021-12-17 DIAGNOSIS — R1310 Dysphagia, unspecified: Secondary | ICD-10-CM | POA: Diagnosis not present

## 2021-12-17 DIAGNOSIS — E1142 Type 2 diabetes mellitus with diabetic polyneuropathy: Secondary | ICD-10-CM | POA: Diagnosis not present

## 2021-12-17 DIAGNOSIS — K2270 Barrett's esophagus without dysplasia: Secondary | ICD-10-CM | POA: Diagnosis not present

## 2021-12-17 DIAGNOSIS — K912 Postsurgical malabsorption, not elsewhere classified: Secondary | ICD-10-CM | POA: Diagnosis not present

## 2021-12-17 DIAGNOSIS — E43 Unspecified severe protein-calorie malnutrition: Secondary | ICD-10-CM | POA: Diagnosis not present

## 2021-12-17 DIAGNOSIS — Z452 Encounter for adjustment and management of vascular access device: Secondary | ICD-10-CM | POA: Diagnosis not present

## 2021-12-18 ENCOUNTER — Ambulatory Visit (INDEPENDENT_AMBULATORY_CARE_PROVIDER_SITE_OTHER): Payer: Medicare Other | Admitting: *Deleted

## 2021-12-18 VITALS — BP 97/60 | HR 81 | Temp 98.5°F | Resp 18 | Ht 65.0 in | Wt 123.6 lb

## 2021-12-18 DIAGNOSIS — R1013 Epigastric pain: Secondary | ICD-10-CM | POA: Diagnosis not present

## 2021-12-18 DIAGNOSIS — D508 Other iron deficiency anemias: Secondary | ICD-10-CM

## 2021-12-18 DIAGNOSIS — K2100 Gastro-esophageal reflux disease with esophagitis, without bleeding: Secondary | ICD-10-CM | POA: Diagnosis not present

## 2021-12-18 DIAGNOSIS — K289 Gastrojejunal ulcer, unspecified as acute or chronic, without hemorrhage or perforation: Secondary | ICD-10-CM | POA: Diagnosis not present

## 2021-12-18 DIAGNOSIS — Z9884 Bariatric surgery status: Secondary | ICD-10-CM | POA: Diagnosis not present

## 2021-12-18 MED ORDER — SODIUM CHLORIDE 0.9 % IV SOLN
1000.0000 mg | Freq: Once | INTRAVENOUS | Status: AC
  Administered 2021-12-18: 1000 mg via INTRAVENOUS
  Filled 2021-12-18: qty 10

## 2021-12-18 NOTE — Progress Notes (Signed)
Diagnosis: Iron Deficiency Anemia  Provider:  Marshell Garfinkel MD  Procedure: Infusion  IV Type: Peripheral, IV Location: L Forearm  Monoferric (Ferric Derisomaltose), Dose: 1000 mg  Infusion Start Time: 3151 pm  Infusion Stop Time: 1545 pm  Post Infusion IV Care: Observation period completed and Peripheral IV Discontinued  Discharge: Condition: Good, Destination: Home . AVS provided to patient.   Performed by:  Oren Beckmann, RN

## 2021-12-24 DIAGNOSIS — K2270 Barrett's esophagus without dysplasia: Secondary | ICD-10-CM | POA: Diagnosis not present

## 2021-12-24 DIAGNOSIS — E1142 Type 2 diabetes mellitus with diabetic polyneuropathy: Secondary | ICD-10-CM | POA: Diagnosis not present

## 2021-12-24 DIAGNOSIS — Z452 Encounter for adjustment and management of vascular access device: Secondary | ICD-10-CM | POA: Diagnosis not present

## 2021-12-24 DIAGNOSIS — E43 Unspecified severe protein-calorie malnutrition: Secondary | ICD-10-CM | POA: Diagnosis not present

## 2021-12-24 DIAGNOSIS — K912 Postsurgical malabsorption, not elsewhere classified: Secondary | ICD-10-CM | POA: Diagnosis not present

## 2021-12-24 DIAGNOSIS — R1310 Dysphagia, unspecified: Secondary | ICD-10-CM | POA: Diagnosis not present

## 2021-12-27 ENCOUNTER — Other Ambulatory Visit (HOSPITAL_BASED_OUTPATIENT_CLINIC_OR_DEPARTMENT_OTHER): Payer: Self-pay

## 2021-12-27 DIAGNOSIS — G894 Chronic pain syndrome: Secondary | ICD-10-CM | POA: Diagnosis not present

## 2021-12-27 DIAGNOSIS — M545 Low back pain, unspecified: Secondary | ICD-10-CM | POA: Diagnosis not present

## 2021-12-27 DIAGNOSIS — G609 Hereditary and idiopathic neuropathy, unspecified: Secondary | ICD-10-CM | POA: Diagnosis not present

## 2021-12-27 DIAGNOSIS — F419 Anxiety disorder, unspecified: Secondary | ICD-10-CM | POA: Diagnosis not present

## 2021-12-27 DIAGNOSIS — G8929 Other chronic pain: Secondary | ICD-10-CM | POA: Diagnosis not present

## 2021-12-27 DIAGNOSIS — M542 Cervicalgia: Secondary | ICD-10-CM | POA: Diagnosis not present

## 2021-12-27 DIAGNOSIS — F32A Depression, unspecified: Secondary | ICD-10-CM | POA: Diagnosis not present

## 2021-12-27 MED ORDER — FENTANYL 25 MCG/HR TD PT72
1.0000 | MEDICATED_PATCH | TRANSDERMAL | 0 refills | Status: DC
  Filled 2021-12-27: qty 10, 30d supply, fill #0

## 2021-12-27 MED ORDER — HYDROCODONE-ACETAMINOPHEN 10-325 MG PO TABS
ORAL_TABLET | ORAL | 0 refills | Status: DC
  Filled 2021-12-27: qty 60, 15d supply, fill #0

## 2021-12-29 DIAGNOSIS — I341 Nonrheumatic mitral (valve) prolapse: Secondary | ICD-10-CM | POA: Diagnosis not present

## 2021-12-29 DIAGNOSIS — Z9181 History of falling: Secondary | ICD-10-CM | POA: Diagnosis not present

## 2021-12-29 DIAGNOSIS — N189 Chronic kidney disease, unspecified: Secondary | ICD-10-CM | POA: Diagnosis not present

## 2021-12-29 DIAGNOSIS — M810 Age-related osteoporosis without current pathological fracture: Secondary | ICD-10-CM | POA: Diagnosis not present

## 2021-12-29 DIAGNOSIS — N200 Calculus of kidney: Secondary | ICD-10-CM | POA: Diagnosis not present

## 2021-12-29 DIAGNOSIS — E1142 Type 2 diabetes mellitus with diabetic polyneuropathy: Secondary | ICD-10-CM | POA: Diagnosis not present

## 2021-12-29 DIAGNOSIS — M797 Fibromyalgia: Secondary | ICD-10-CM | POA: Diagnosis not present

## 2021-12-29 DIAGNOSIS — F411 Generalized anxiety disorder: Secondary | ICD-10-CM | POA: Diagnosis not present

## 2021-12-29 DIAGNOSIS — D509 Iron deficiency anemia, unspecified: Secondary | ICD-10-CM | POA: Diagnosis not present

## 2021-12-29 DIAGNOSIS — K2270 Barrett's esophagus without dysplasia: Secondary | ICD-10-CM | POA: Diagnosis not present

## 2021-12-29 DIAGNOSIS — E559 Vitamin D deficiency, unspecified: Secondary | ICD-10-CM | POA: Diagnosis not present

## 2021-12-29 DIAGNOSIS — G8929 Other chronic pain: Secondary | ICD-10-CM | POA: Diagnosis not present

## 2021-12-29 DIAGNOSIS — K912 Postsurgical malabsorption, not elsewhere classified: Secondary | ICD-10-CM | POA: Diagnosis not present

## 2021-12-29 DIAGNOSIS — E43 Unspecified severe protein-calorie malnutrition: Secondary | ICD-10-CM | POA: Diagnosis not present

## 2021-12-29 DIAGNOSIS — F339 Major depressive disorder, recurrent, unspecified: Secondary | ICD-10-CM | POA: Diagnosis not present

## 2021-12-29 DIAGNOSIS — I340 Nonrheumatic mitral (valve) insufficiency: Secondary | ICD-10-CM | POA: Diagnosis not present

## 2021-12-29 DIAGNOSIS — Z8711 Personal history of peptic ulcer disease: Secondary | ICD-10-CM | POA: Diagnosis not present

## 2021-12-29 DIAGNOSIS — E1122 Type 2 diabetes mellitus with diabetic chronic kidney disease: Secondary | ICD-10-CM | POA: Diagnosis not present

## 2021-12-29 DIAGNOSIS — Z452 Encounter for adjustment and management of vascular access device: Secondary | ICD-10-CM | POA: Diagnosis not present

## 2021-12-29 DIAGNOSIS — G43909 Migraine, unspecified, not intractable, without status migrainosus: Secondary | ICD-10-CM | POA: Diagnosis not present

## 2021-12-29 DIAGNOSIS — D631 Anemia in chronic kidney disease: Secondary | ICD-10-CM | POA: Diagnosis not present

## 2021-12-29 DIAGNOSIS — R1310 Dysphagia, unspecified: Secondary | ICD-10-CM | POA: Diagnosis not present

## 2021-12-29 DIAGNOSIS — H3530 Unspecified macular degeneration: Secondary | ICD-10-CM | POA: Diagnosis not present

## 2021-12-31 DIAGNOSIS — K912 Postsurgical malabsorption, not elsewhere classified: Secondary | ICD-10-CM | POA: Diagnosis not present

## 2021-12-31 DIAGNOSIS — E43 Unspecified severe protein-calorie malnutrition: Secondary | ICD-10-CM | POA: Diagnosis not present

## 2021-12-31 DIAGNOSIS — Z452 Encounter for adjustment and management of vascular access device: Secondary | ICD-10-CM | POA: Diagnosis not present

## 2021-12-31 DIAGNOSIS — K2270 Barrett's esophagus without dysplasia: Secondary | ICD-10-CM | POA: Diagnosis not present

## 2021-12-31 DIAGNOSIS — E1142 Type 2 diabetes mellitus with diabetic polyneuropathy: Secondary | ICD-10-CM | POA: Diagnosis not present

## 2021-12-31 DIAGNOSIS — R1310 Dysphagia, unspecified: Secondary | ICD-10-CM | POA: Diagnosis not present

## 2022-01-04 ENCOUNTER — Other Ambulatory Visit (HOSPITAL_BASED_OUTPATIENT_CLINIC_OR_DEPARTMENT_OTHER): Payer: Self-pay

## 2022-01-08 DIAGNOSIS — R1310 Dysphagia, unspecified: Secondary | ICD-10-CM | POA: Diagnosis not present

## 2022-01-08 DIAGNOSIS — E43 Unspecified severe protein-calorie malnutrition: Secondary | ICD-10-CM | POA: Diagnosis not present

## 2022-01-08 DIAGNOSIS — K912 Postsurgical malabsorption, not elsewhere classified: Secondary | ICD-10-CM | POA: Diagnosis not present

## 2022-01-08 DIAGNOSIS — E1142 Type 2 diabetes mellitus with diabetic polyneuropathy: Secondary | ICD-10-CM | POA: Diagnosis not present

## 2022-01-08 DIAGNOSIS — Z452 Encounter for adjustment and management of vascular access device: Secondary | ICD-10-CM | POA: Diagnosis not present

## 2022-01-08 DIAGNOSIS — K2270 Barrett's esophagus without dysplasia: Secondary | ICD-10-CM | POA: Diagnosis not present

## 2022-01-16 DIAGNOSIS — E1142 Type 2 diabetes mellitus with diabetic polyneuropathy: Secondary | ICD-10-CM | POA: Diagnosis not present

## 2022-01-16 DIAGNOSIS — E43 Unspecified severe protein-calorie malnutrition: Secondary | ICD-10-CM | POA: Diagnosis not present

## 2022-01-16 DIAGNOSIS — Z452 Encounter for adjustment and management of vascular access device: Secondary | ICD-10-CM | POA: Diagnosis not present

## 2022-01-16 DIAGNOSIS — K912 Postsurgical malabsorption, not elsewhere classified: Secondary | ICD-10-CM | POA: Diagnosis not present

## 2022-01-16 DIAGNOSIS — K2270 Barrett's esophagus without dysplasia: Secondary | ICD-10-CM | POA: Diagnosis not present

## 2022-01-16 DIAGNOSIS — R1310 Dysphagia, unspecified: Secondary | ICD-10-CM | POA: Diagnosis not present

## 2022-01-17 ENCOUNTER — Other Ambulatory Visit: Payer: Self-pay | Admitting: Adult Health

## 2022-01-17 ENCOUNTER — Encounter: Payer: Self-pay | Admitting: Family Medicine

## 2022-01-17 DIAGNOSIS — Z1231 Encounter for screening mammogram for malignant neoplasm of breast: Secondary | ICD-10-CM

## 2022-01-18 ENCOUNTER — Inpatient Hospital Stay: Payer: Medicare Other | Attending: Hematology and Oncology

## 2022-01-18 ENCOUNTER — Other Ambulatory Visit: Payer: Self-pay

## 2022-01-18 DIAGNOSIS — D509 Iron deficiency anemia, unspecified: Secondary | ICD-10-CM | POA: Insufficient documentation

## 2022-01-18 DIAGNOSIS — Z452 Encounter for adjustment and management of vascular access device: Secondary | ICD-10-CM | POA: Insufficient documentation

## 2022-01-18 DIAGNOSIS — Z95828 Presence of other vascular implants and grafts: Secondary | ICD-10-CM

## 2022-01-18 DIAGNOSIS — I2510 Atherosclerotic heart disease of native coronary artery without angina pectoris: Secondary | ICD-10-CM

## 2022-01-18 MED ORDER — HEPARIN SOD (PORK) LOCK FLUSH 100 UNIT/ML IV SOLN
500.0000 [IU] | INTRAVENOUS | Status: AC | PRN
  Administered 2022-01-18: 500 [IU]

## 2022-01-18 MED ORDER — ALTEPLASE 2 MG IJ SOLR
2.0000 mg | Freq: Once | INTRAMUSCULAR | Status: AC | PRN
  Administered 2022-01-18: 2 mg
  Filled 2022-01-18: qty 2

## 2022-01-18 MED ORDER — SODIUM CHLORIDE 0.9% FLUSH
10.0000 mL | INTRAVENOUS | Status: AC | PRN
  Administered 2022-01-18: 10 mL

## 2022-01-21 DIAGNOSIS — E1142 Type 2 diabetes mellitus with diabetic polyneuropathy: Secondary | ICD-10-CM | POA: Diagnosis not present

## 2022-01-21 DIAGNOSIS — R1310 Dysphagia, unspecified: Secondary | ICD-10-CM | POA: Diagnosis not present

## 2022-01-21 DIAGNOSIS — Z452 Encounter for adjustment and management of vascular access device: Secondary | ICD-10-CM | POA: Diagnosis not present

## 2022-01-21 DIAGNOSIS — K2270 Barrett's esophagus without dysplasia: Secondary | ICD-10-CM | POA: Diagnosis not present

## 2022-01-21 DIAGNOSIS — K912 Postsurgical malabsorption, not elsewhere classified: Secondary | ICD-10-CM | POA: Diagnosis not present

## 2022-01-21 DIAGNOSIS — E43 Unspecified severe protein-calorie malnutrition: Secondary | ICD-10-CM | POA: Diagnosis not present

## 2022-01-28 ENCOUNTER — Other Ambulatory Visit (HOSPITAL_BASED_OUTPATIENT_CLINIC_OR_DEPARTMENT_OTHER): Payer: Self-pay

## 2022-01-28 DIAGNOSIS — Z8711 Personal history of peptic ulcer disease: Secondary | ICD-10-CM | POA: Diagnosis not present

## 2022-01-28 DIAGNOSIS — I341 Nonrheumatic mitral (valve) prolapse: Secondary | ICD-10-CM | POA: Diagnosis not present

## 2022-01-28 DIAGNOSIS — D509 Iron deficiency anemia, unspecified: Secondary | ICD-10-CM | POA: Diagnosis not present

## 2022-01-28 DIAGNOSIS — D631 Anemia in chronic kidney disease: Secondary | ICD-10-CM | POA: Diagnosis not present

## 2022-01-28 DIAGNOSIS — M797 Fibromyalgia: Secondary | ICD-10-CM | POA: Diagnosis not present

## 2022-01-28 DIAGNOSIS — M542 Cervicalgia: Secondary | ICD-10-CM | POA: Diagnosis not present

## 2022-01-28 DIAGNOSIS — R1310 Dysphagia, unspecified: Secondary | ICD-10-CM | POA: Diagnosis not present

## 2022-01-28 DIAGNOSIS — G43909 Migraine, unspecified, not intractable, without status migrainosus: Secondary | ICD-10-CM | POA: Diagnosis not present

## 2022-01-28 DIAGNOSIS — F339 Major depressive disorder, recurrent, unspecified: Secondary | ICD-10-CM | POA: Diagnosis not present

## 2022-01-28 DIAGNOSIS — M810 Age-related osteoporosis without current pathological fracture: Secondary | ICD-10-CM | POA: Diagnosis not present

## 2022-01-28 DIAGNOSIS — E559 Vitamin D deficiency, unspecified: Secondary | ICD-10-CM | POA: Diagnosis not present

## 2022-01-28 DIAGNOSIS — K912 Postsurgical malabsorption, not elsewhere classified: Secondary | ICD-10-CM | POA: Diagnosis not present

## 2022-01-28 DIAGNOSIS — F411 Generalized anxiety disorder: Secondary | ICD-10-CM | POA: Diagnosis not present

## 2022-01-28 DIAGNOSIS — H3530 Unspecified macular degeneration: Secondary | ICD-10-CM | POA: Diagnosis not present

## 2022-01-28 DIAGNOSIS — I340 Nonrheumatic mitral (valve) insufficiency: Secondary | ICD-10-CM | POA: Diagnosis not present

## 2022-01-28 DIAGNOSIS — N200 Calculus of kidney: Secondary | ICD-10-CM | POA: Diagnosis not present

## 2022-01-28 DIAGNOSIS — E43 Unspecified severe protein-calorie malnutrition: Secondary | ICD-10-CM | POA: Diagnosis not present

## 2022-01-28 DIAGNOSIS — G8929 Other chronic pain: Secondary | ICD-10-CM | POA: Diagnosis not present

## 2022-01-28 DIAGNOSIS — E1142 Type 2 diabetes mellitus with diabetic polyneuropathy: Secondary | ICD-10-CM | POA: Diagnosis not present

## 2022-01-28 DIAGNOSIS — Z452 Encounter for adjustment and management of vascular access device: Secondary | ICD-10-CM | POA: Diagnosis not present

## 2022-01-28 DIAGNOSIS — K2270 Barrett's esophagus without dysplasia: Secondary | ICD-10-CM | POA: Diagnosis not present

## 2022-01-28 DIAGNOSIS — E1122 Type 2 diabetes mellitus with diabetic chronic kidney disease: Secondary | ICD-10-CM | POA: Diagnosis not present

## 2022-01-28 DIAGNOSIS — F419 Anxiety disorder, unspecified: Secondary | ICD-10-CM | POA: Diagnosis not present

## 2022-01-28 DIAGNOSIS — Z9181 History of falling: Secondary | ICD-10-CM | POA: Diagnosis not present

## 2022-01-28 DIAGNOSIS — M545 Low back pain, unspecified: Secondary | ICD-10-CM | POA: Diagnosis not present

## 2022-01-28 DIAGNOSIS — G894 Chronic pain syndrome: Secondary | ICD-10-CM | POA: Diagnosis not present

## 2022-01-28 DIAGNOSIS — N189 Chronic kidney disease, unspecified: Secondary | ICD-10-CM | POA: Diagnosis not present

## 2022-01-28 DIAGNOSIS — G609 Hereditary and idiopathic neuropathy, unspecified: Secondary | ICD-10-CM | POA: Diagnosis not present

## 2022-01-28 DIAGNOSIS — F32A Depression, unspecified: Secondary | ICD-10-CM | POA: Diagnosis not present

## 2022-01-28 MED ORDER — FENTANYL 25 MCG/HR TD PT72
1.0000 | MEDICATED_PATCH | TRANSDERMAL | 0 refills | Status: DC
  Filled 2022-01-28 – 2022-02-06 (×2): qty 10, 30d supply, fill #0

## 2022-01-28 MED ORDER — HYDROCODONE-ACETAMINOPHEN 10-325 MG PO TABS
ORAL_TABLET | Freq: Four times a day (QID) | ORAL | 0 refills | Status: DC | PRN
  Filled 2022-01-28: qty 120, 30d supply, fill #0

## 2022-01-29 DIAGNOSIS — Z452 Encounter for adjustment and management of vascular access device: Secondary | ICD-10-CM | POA: Diagnosis not present

## 2022-01-29 DIAGNOSIS — R1310 Dysphagia, unspecified: Secondary | ICD-10-CM | POA: Diagnosis not present

## 2022-01-29 DIAGNOSIS — K2270 Barrett's esophagus without dysplasia: Secondary | ICD-10-CM | POA: Diagnosis not present

## 2022-01-29 DIAGNOSIS — K912 Postsurgical malabsorption, not elsewhere classified: Secondary | ICD-10-CM | POA: Diagnosis not present

## 2022-01-29 DIAGNOSIS — E1142 Type 2 diabetes mellitus with diabetic polyneuropathy: Secondary | ICD-10-CM | POA: Diagnosis not present

## 2022-01-29 DIAGNOSIS — E43 Unspecified severe protein-calorie malnutrition: Secondary | ICD-10-CM | POA: Diagnosis not present

## 2022-01-31 DIAGNOSIS — K5900 Constipation, unspecified: Secondary | ICD-10-CM | POA: Diagnosis not present

## 2022-01-31 DIAGNOSIS — M1990 Unspecified osteoarthritis, unspecified site: Secondary | ICD-10-CM | POA: Diagnosis not present

## 2022-01-31 DIAGNOSIS — R110 Nausea: Secondary | ICD-10-CM | POA: Diagnosis not present

## 2022-02-03 DIAGNOSIS — K912 Postsurgical malabsorption, not elsewhere classified: Secondary | ICD-10-CM | POA: Diagnosis not present

## 2022-02-03 DIAGNOSIS — E43 Unspecified severe protein-calorie malnutrition: Secondary | ICD-10-CM | POA: Diagnosis not present

## 2022-02-04 DIAGNOSIS — E43 Unspecified severe protein-calorie malnutrition: Secondary | ICD-10-CM | POA: Diagnosis not present

## 2022-02-04 DIAGNOSIS — R1310 Dysphagia, unspecified: Secondary | ICD-10-CM | POA: Diagnosis not present

## 2022-02-04 DIAGNOSIS — K912 Postsurgical malabsorption, not elsewhere classified: Secondary | ICD-10-CM | POA: Diagnosis not present

## 2022-02-04 DIAGNOSIS — Z452 Encounter for adjustment and management of vascular access device: Secondary | ICD-10-CM | POA: Diagnosis not present

## 2022-02-04 DIAGNOSIS — E1142 Type 2 diabetes mellitus with diabetic polyneuropathy: Secondary | ICD-10-CM | POA: Diagnosis not present

## 2022-02-04 DIAGNOSIS — K2270 Barrett's esophagus without dysplasia: Secondary | ICD-10-CM | POA: Diagnosis not present

## 2022-02-06 ENCOUNTER — Other Ambulatory Visit (HOSPITAL_BASED_OUTPATIENT_CLINIC_OR_DEPARTMENT_OTHER): Payer: Self-pay

## 2022-02-08 ENCOUNTER — Other Ambulatory Visit: Payer: Self-pay | Admitting: Anesthesiology

## 2022-02-08 ENCOUNTER — Other Ambulatory Visit: Payer: Self-pay

## 2022-02-08 DIAGNOSIS — M47812 Spondylosis without myelopathy or radiculopathy, cervical region: Secondary | ICD-10-CM | POA: Diagnosis not present

## 2022-02-08 MED ORDER — BUTALBITAL-APAP-CAFFEINE 50-325-40 MG PO TABS: 1.0000 | ORAL_TABLET | Freq: Four times a day (QID) | ORAL | 2 refills | Status: DC | PRN

## 2022-02-08 NOTE — Telephone Encounter (Signed)
Pt left message with AN. She needs a refill on her medication.  1. Which medications need refilled? Fioricet 50-325 mg  2. Which pharmacy/location is medication to be sent to? [facility] on [address]  3. Do they need a 30 day or 90 day supply? 30 day supply

## 2022-02-14 HISTORY — PX: CERVICAL SPINE SURGERY: SHX589

## 2022-02-15 DIAGNOSIS — E43 Unspecified severe protein-calorie malnutrition: Secondary | ICD-10-CM | POA: Diagnosis not present

## 2022-02-15 DIAGNOSIS — K2270 Barrett's esophagus without dysplasia: Secondary | ICD-10-CM | POA: Diagnosis not present

## 2022-02-15 DIAGNOSIS — R1310 Dysphagia, unspecified: Secondary | ICD-10-CM | POA: Diagnosis not present

## 2022-02-15 DIAGNOSIS — K912 Postsurgical malabsorption, not elsewhere classified: Secondary | ICD-10-CM | POA: Diagnosis not present

## 2022-02-15 DIAGNOSIS — Z452 Encounter for adjustment and management of vascular access device: Secondary | ICD-10-CM | POA: Diagnosis not present

## 2022-02-15 DIAGNOSIS — E1142 Type 2 diabetes mellitus with diabetic polyneuropathy: Secondary | ICD-10-CM | POA: Diagnosis not present

## 2022-02-18 ENCOUNTER — Telehealth: Payer: Self-pay | Admitting: Pharmacist

## 2022-02-18 DIAGNOSIS — E1142 Type 2 diabetes mellitus with diabetic polyneuropathy: Secondary | ICD-10-CM | POA: Diagnosis not present

## 2022-02-18 DIAGNOSIS — E43 Unspecified severe protein-calorie malnutrition: Secondary | ICD-10-CM | POA: Diagnosis not present

## 2022-02-18 DIAGNOSIS — M810 Age-related osteoporosis without current pathological fracture: Secondary | ICD-10-CM

## 2022-02-18 DIAGNOSIS — K2270 Barrett's esophagus without dysplasia: Secondary | ICD-10-CM | POA: Diagnosis not present

## 2022-02-18 DIAGNOSIS — E559 Vitamin D deficiency, unspecified: Secondary | ICD-10-CM

## 2022-02-18 DIAGNOSIS — K912 Postsurgical malabsorption, not elsewhere classified: Secondary | ICD-10-CM | POA: Diagnosis not present

## 2022-02-18 DIAGNOSIS — Z452 Encounter for adjustment and management of vascular access device: Secondary | ICD-10-CM | POA: Diagnosis not present

## 2022-02-18 DIAGNOSIS — R1310 Dysphagia, unspecified: Secondary | ICD-10-CM | POA: Diagnosis not present

## 2022-02-18 DIAGNOSIS — E42 Marasmic kwashiorkor: Secondary | ICD-10-CM | POA: Diagnosis not present

## 2022-02-18 NOTE — Telephone Encounter (Signed)
Patient has Medicare + Champ[employer] as secondary (effective as of 09/14/99 through 08/26/2067).  Medicare covers 80% of the infusion and no authorization is required, and Champ[employer] would cover the 20% of the cost that was not paid for by Medicare as long as Medicare covered the medication after patient meets her Medicare B deductible.  Patient overdue for Prolia - was due 01/30/2022.  MyChart message sent to patient regarding labs needed. Future order for CBC, CMP, and Vitamin D placed today  Knox Saliva, PharmD, MPH, BCPS, CPP Clinical Pharmacist (Rheumatology and Pulmonology)

## 2022-02-22 NOTE — Progress Notes (Deleted)
Office Visit Note  Patient: Kayla [name]             Date of Birth: 11/25/1945           MRN: [id number]             PCP: [facility] Referring: [name], MD Visit Date: 03/08/2022 Occupation: @GUAROCC$ @  Subjective:  No chief complaint on file.   History of Present Illness: Kayla [name] is a 77 y.o. female ***     Activities of Daily Living:  Patient reports morning stiffness for *** {minute/hour:19697}.   Patient {ACTIONS;DENIES/REPORTS:21021675::"Denies"} nocturnal pain.  Difficulty dressing/grooming: {ACTIONS;DENIES/REPORTS:21021675::"Denies"} Difficulty climbing stairs: {ACTIONS;DENIES/REPORTS:21021675::"Denies"} Difficulty getting out of chair: {ACTIONS;DENIES/REPORTS:21021675::"Denies"} Difficulty using hands for taps, buttons, cutlery, and/or writing: {ACTIONS;DENIES/REPORTS:21021675::"Denies"}  No Rheumatology ROS completed.   PMFS History:  Patient Active Problem List   Diagnosis Date Noted   Iron deficiency anemia due to dietary causes 04/18/2021   Peripheral neuropathy 04/03/2021   Chronic kidney disease 03/22/2021   Macular degeneration 03/22/2021   PTSD (post-traumatic stress disorder)    Epigastric pain 11/11/2019   Anorexia nervosa 11/11/2019   LLQ abdominal pain 11/11/2019   Malnutrition 11/11/2019   Mechanical breakdown implanted electronic neurostimulator spinal cord 11/16/2018   Esophageal dysphagia 10/03/2018   Neuropathic pain 01/23/2018   MVP (mitral valve prolapse) 12/17/2017   Rosacea 11/02/2017   Small intestinal bacterial overgrowth 08/05/2017   Orthostatic hypotension 11/20/2016   Type II diabetes mellitus 11/18/2016   Hypoglycemia 11/08/2016   Cervical facet joint syndrome 10/08/2016   Anal fissure 10/08/2016   Peripheral vascular disease 01/22/2016   Hypertension 01/22/2016   History of hyperparathyroidism 01/22/2016   Mitral regurgitation 12/11/2015   History of gastric bypass 02/11/2014    Chronic maxillary sinusitis 09/15/2013   Personal history of colonic polyps 07/22/2013   HA (headache)-chronic/tension type 07/28/2012   Osteoporosis 07/28/2012   Nephrolithiasis 07/28/2012   Fibromyalgia 07/26/2012   Chronic interstitial cystitis 10/16/2011   Chronic constipation 10/04/2010   Loss of weight 11/27/2009   Generalized anxiety disorder 03/18/2007   Adjustment disorder with depressed mood 03/18/2007   GERD (gastroesophageal reflux disease) 03/18/2007   Diaphragmatic hernia 03/18/2007   Osteoarthritis 03/18/2007   Degeneration of cervical intervertebral disc 03/18/2007   Degeneration of lumbar or lumbosacral intervertebral disc 03/18/2007   Myalgia 03/18/2007    Past Medical History:  Diagnosis Date   Adjustment disorder with depressed mood 03/18/2007   Allergy    Anal fissure 10/08/2016   Angina    takes Propanolol prn and it stops her chest   Anorexia nervosa 11/11/2019   Barrett esophagus    not consistently present   Cataract    removed  but had retinal detachment - repeat cataract right eye   Cervical facet joint syndrome 10/08/2016   Chronic constipation 10/04/2010   Chronic interstitial cystitis 10/16/2011   Chronic kidney disease    kidney stones and UTI   Chronic maxillary sinusitis 09/15/2013   Chronic narcotic dependence [id number]   Complication of anesthesia    either  BP or pulse dropped with last surgery   Degeneration of cervical intervertebral disc 03/18/2007   Degeneration of lumbar or lumbosacral intervertebral disc 03/18/2007   Diaphragmatic hernia 03/18/2007   Dysrhythmia    brought on by stress   Epigastric pain 11/11/2019   Esophageal dysphagia 10/03/2018   External hemorrhoids    Fibromyalgia    neuropathy knees ankles and toes, bladder   Generalized anxiety disorder 03/18/2007  GERD (gastroesophageal reflux disease) 03/18/2007   HA (headache)-chronic/tension type 07/28/2012   Heart murmur    Hiatal hernia    History of anemia     History of gastric bypass 02/11/2014   1999 at [facility]   History of hyperparathyroidism 01/22/2016   History of kidney stones    Hypertension    Hypoglycemia 11/08/2016   IBS (irritable bowel syndrome)    Internal hemorrhoids    LLQ abdominal pain 11/11/2019   Macular degeneration    Malnutrition 11/11/2019   Mechanical breakdown implanted electronic neurostimulator spinal cord 11/16/2018   Mitral regurgitation 12/11/2015   MVP (mitral valve prolapse) 12/17/2017   Myalgia 03/18/2007   Nephrolithiasis 07/28/2012   lithotrip 2013-Eskridge,MD   Neuropathic pain 01/23/2018   Orthostatic hypotension 11/20/2016   Osteoarthritis    all over   Osteopetrosis    Osteoporosis 07/28/2012   Peripheral vascular disease    superficial phlebitis in left calf   Personal history of colonic polyps 07/22/2013   07/2013 - 3 small adenomas - repeat colonoscopy 2018   PTSD (post-traumatic stress disorder)    Rosacea 11/02/2017   Small intestinal bacterial overgrowth 08/05/2017   + lactulose breath test 06/2017 Xifaxan Rx   Type II diabetes mellitus 11/18/2016    Family History  Problem Relation Age of Onset   Stroke Mother    Lung cancer Mother    Heart disease Mother    Diabetes Mother    Hypertension Father    Stroke Father    Heart disease Father    Kidney disease Father    Seizures Father        epilepsy, and sisters x 2   Memory loss Father    Lung cancer Sister    Breast cancer Sister    Cancer - Lung Sister    Heart disease Sister    Diabetes Sister    Pancreatic cancer Sister    Healthy Daughter    Healthy Son    Colon cancer Maternal Aunt        12 relatives   Colon cancer Paternal Aunt    Colon cancer Paternal Aunt    Uterine cancer Other        aunt   Heart disease Other        grandmother   Esophageal cancer Neg Hx    Rectal cancer Neg Hx    Stomach cancer Neg Hx    Colon polyps Neg Hx    Past Surgical History:  Procedure Laterality Date   ABDOMINAL  HYSTERECTOMY     APPENDECTOMY     CARDIAC CATHETERIZATION  03/22/1991   EF 61%   CARDIOVASCULAR STRESS TEST  10/03/2006   CHOLECYSTECTOMY     COLONOSCOPY  06/23/2008   normal   DILATION AND CURETTAGE OF UTERUS     x2   ESOPHAGUS SURGERY     EXPLORATORY LAPAROTOMY  1993   EYE SURGERY     GASTRIC BYPASS  1999   GASTRIC RESTRICTION SURGERY  1991   LASIK Bilateral    Right Arm Surgery     Right Knee Arthroscopy     Rt wrist fx  2009   spinal surgery battery implant     stomach stappeling  1991   STONE EXTRACTION WITH BASKET  2012   TONSILLECTOMY     TUBAL LIGATION     UPPER GASTROINTESTINAL ENDOSCOPY  06/23/2008   w/Dil, Barrett's esophagus   US ECHOCARDIOGRAPHY  01/21/2007   EF 55-60%   US ECHOCARDIOGRAPHY  08/30/2003   EF 55-60%   Social History   Social History Narrative   Right handed   Two story home   She retired on disability   Married   2 Children   Immunization History  Administered Date(s) Administered   Hepatitis B, adult 04/21/2017   Influenza Split 10/02/2011   Influenza Whole 08/21/2007   Influenza, High Dose Seasonal PF 12/29/2017   Influenza,inj,Quad PF,6+ Mos 12/15/2013, 11/23/2014, 09/27/2015, 10/04/2016   Influenza-Unspecified 12/15/2013, 11/23/2014, 09/27/2015, 10/04/2016   PFIZER(Purple Top)SARS-COV-2 Vaccination 03/31/2019, 04/20/2019   Pneumococcal Conjugate-13 09/05/2014   Pneumococcal Polysaccharide-23 10/02/2011, 02/08/2013   Td 01/14/2006   Tdap 01/22/2016     Objective: Vital Signs: There were no vitals taken for this visit.   Physical Exam   Musculoskeletal Exam: ***  CDAI Exam: CDAI Score: -- Patient Global: --; Provider Global: -- Swollen: --; Tender: -- Joint Exam 03/08/2022   No joint exam has been documented for this visit   There is currently no information documented on the homunculus. Go to the Rheumatology activity and complete the homunculus joint exam.  Investigation: No additional findings.  Imaging: No results  found.  Recent Labs: Lab Results  Component Value Date   WBC 7.3 11/30/2021   HGB 11.5 (L) 11/30/2021   PLT 268 11/30/2021   NA 142 11/30/2021   K 4.2 11/30/2021   CL 105 11/30/2021   CO2 34 (H) 11/30/2021   GLUCOSE 111 (H) 11/30/2021   BUN 41 (H) 11/30/2021   CREATININE 0.57 11/30/2021   BILITOT 0.3 11/30/2021   ALKPHOS 50 11/30/2021   AST 34 11/30/2021   ALT 29 11/30/2021   PROT 6.0 (L) 11/30/2021   ALBUMIN 3.7 11/30/2021   CALCIUM 9.1 11/30/2021   GFRAA 95 05/05/2020    Speciality Comments: Fosamax 2013-2018 Patient declined Tymlos and Forteo-travels frequently. Prolia May 26, 2020, January 26, 2021  Procedures:  No procedures performed Allergies: Ambien [zolpidem tartrate], Codeine, Oxycodone-acetaminophen, Tylox [oxycodone-acetaminophen], Darvocet [propoxyphene n-acetaminophen], Darvon [propoxyphene], Lorcet [hydrocodone-acetaminophen], Opium, Vicodin [hydrocodone-acetaminophen], Wheat bran, Amoxicillin, and Penicillins   Assessment / Plan:     Visit Diagnoses: No diagnosis found.  Orders: No orders of the defined types were placed in this encounter.  No orders of the defined types were placed in this encounter.   Face-to-face time spent with patient was *** minutes. Greater than 50% of time was spent in counseling and coordination of care.  Follow-Up Instructions: No follow-ups on file.   Bo Merino, MD  Note - This record has been created using Editor, commissioning.  Chart creation errors have been sought, but may not always  have been located. Such creation errors do not reflect on  the standard of medical care.

## 2022-02-25 ENCOUNTER — Other Ambulatory Visit (HOSPITAL_BASED_OUTPATIENT_CLINIC_OR_DEPARTMENT_OTHER): Payer: Self-pay

## 2022-02-25 DIAGNOSIS — M545 Low back pain, unspecified: Secondary | ICD-10-CM | POA: Diagnosis not present

## 2022-02-25 DIAGNOSIS — G609 Hereditary and idiopathic neuropathy, unspecified: Secondary | ICD-10-CM | POA: Diagnosis not present

## 2022-02-25 DIAGNOSIS — M542 Cervicalgia: Secondary | ICD-10-CM | POA: Diagnosis not present

## 2022-02-25 DIAGNOSIS — F419 Anxiety disorder, unspecified: Secondary | ICD-10-CM | POA: Diagnosis not present

## 2022-02-25 DIAGNOSIS — F32A Depression, unspecified: Secondary | ICD-10-CM | POA: Diagnosis not present

## 2022-02-25 DIAGNOSIS — G8929 Other chronic pain: Secondary | ICD-10-CM | POA: Diagnosis not present

## 2022-02-25 DIAGNOSIS — G894 Chronic pain syndrome: Secondary | ICD-10-CM | POA: Diagnosis not present

## 2022-02-25 MED ORDER — HYDROCODONE-ACETAMINOPHEN 10-325 MG PO TABS
0.5000 | ORAL_TABLET | Freq: Four times a day (QID) | ORAL | 0 refills | Status: DC | PRN
  Filled 2022-02-25: qty 120, 30d supply, fill #0

## 2022-02-25 MED ORDER — FENTANYL 25 MCG/HR TD PT72
1.0000 | MEDICATED_PATCH | TRANSDERMAL | 0 refills | Status: DC
  Filled 2022-02-25 – 2022-03-08 (×3): qty 10, 30d supply, fill #0

## 2022-02-26 DIAGNOSIS — E1142 Type 2 diabetes mellitus with diabetic polyneuropathy: Secondary | ICD-10-CM | POA: Diagnosis not present

## 2022-02-26 DIAGNOSIS — K2270 Barrett's esophagus without dysplasia: Secondary | ICD-10-CM | POA: Diagnosis not present

## 2022-02-26 DIAGNOSIS — K912 Postsurgical malabsorption, not elsewhere classified: Secondary | ICD-10-CM | POA: Diagnosis not present

## 2022-02-26 DIAGNOSIS — E43 Unspecified severe protein-calorie malnutrition: Secondary | ICD-10-CM | POA: Diagnosis not present

## 2022-02-26 DIAGNOSIS — R1310 Dysphagia, unspecified: Secondary | ICD-10-CM | POA: Diagnosis not present

## 2022-02-26 DIAGNOSIS — Z452 Encounter for adjustment and management of vascular access device: Secondary | ICD-10-CM | POA: Diagnosis not present

## 2022-02-27 DIAGNOSIS — K2270 Barrett's esophagus without dysplasia: Secondary | ICD-10-CM | POA: Diagnosis not present

## 2022-02-27 DIAGNOSIS — D631 Anemia in chronic kidney disease: Secondary | ICD-10-CM | POA: Diagnosis not present

## 2022-02-27 DIAGNOSIS — G43909 Migraine, unspecified, not intractable, without status migrainosus: Secondary | ICD-10-CM | POA: Diagnosis not present

## 2022-02-27 DIAGNOSIS — F339 Major depressive disorder, recurrent, unspecified: Secondary | ICD-10-CM | POA: Diagnosis not present

## 2022-02-27 DIAGNOSIS — I341 Nonrheumatic mitral (valve) prolapse: Secondary | ICD-10-CM | POA: Diagnosis not present

## 2022-02-27 DIAGNOSIS — N200 Calculus of kidney: Secondary | ICD-10-CM | POA: Diagnosis not present

## 2022-02-27 DIAGNOSIS — M797 Fibromyalgia: Secondary | ICD-10-CM | POA: Diagnosis not present

## 2022-02-27 DIAGNOSIS — F411 Generalized anxiety disorder: Secondary | ICD-10-CM | POA: Diagnosis not present

## 2022-02-27 DIAGNOSIS — D509 Iron deficiency anemia, unspecified: Secondary | ICD-10-CM | POA: Diagnosis not present

## 2022-02-27 DIAGNOSIS — I340 Nonrheumatic mitral (valve) insufficiency: Secondary | ICD-10-CM | POA: Diagnosis not present

## 2022-02-27 DIAGNOSIS — Z9181 History of falling: Secondary | ICD-10-CM | POA: Diagnosis not present

## 2022-02-27 DIAGNOSIS — Z791 Long term (current) use of non-steroidal anti-inflammatories (NSAID): Secondary | ICD-10-CM | POA: Diagnosis not present

## 2022-02-27 DIAGNOSIS — K912 Postsurgical malabsorption, not elsewhere classified: Secondary | ICD-10-CM | POA: Diagnosis not present

## 2022-02-27 DIAGNOSIS — E1142 Type 2 diabetes mellitus with diabetic polyneuropathy: Secondary | ICD-10-CM | POA: Diagnosis not present

## 2022-02-27 DIAGNOSIS — M810 Age-related osteoporosis without current pathological fracture: Secondary | ICD-10-CM | POA: Diagnosis not present

## 2022-02-27 DIAGNOSIS — R1310 Dysphagia, unspecified: Secondary | ICD-10-CM | POA: Diagnosis not present

## 2022-02-27 DIAGNOSIS — E1122 Type 2 diabetes mellitus with diabetic chronic kidney disease: Secondary | ICD-10-CM | POA: Diagnosis not present

## 2022-02-27 DIAGNOSIS — E43 Unspecified severe protein-calorie malnutrition: Secondary | ICD-10-CM | POA: Diagnosis not present

## 2022-02-27 DIAGNOSIS — N189 Chronic kidney disease, unspecified: Secondary | ICD-10-CM | POA: Diagnosis not present

## 2022-02-27 DIAGNOSIS — E559 Vitamin D deficiency, unspecified: Secondary | ICD-10-CM | POA: Diagnosis not present

## 2022-02-27 DIAGNOSIS — Z452 Encounter for adjustment and management of vascular access device: Secondary | ICD-10-CM | POA: Diagnosis not present

## 2022-02-27 DIAGNOSIS — Z8711 Personal history of peptic ulcer disease: Secondary | ICD-10-CM | POA: Diagnosis not present

## 2022-02-27 DIAGNOSIS — G8929 Other chronic pain: Secondary | ICD-10-CM | POA: Diagnosis not present

## 2022-02-27 DIAGNOSIS — H3530 Unspecified macular degeneration: Secondary | ICD-10-CM | POA: Diagnosis not present

## 2022-02-28 ENCOUNTER — Other Ambulatory Visit (HOSPITAL_BASED_OUTPATIENT_CLINIC_OR_DEPARTMENT_OTHER): Payer: Self-pay

## 2022-03-01 ENCOUNTER — Other Ambulatory Visit (HOSPITAL_BASED_OUTPATIENT_CLINIC_OR_DEPARTMENT_OTHER): Payer: Self-pay

## 2022-03-04 DIAGNOSIS — K912 Postsurgical malabsorption, not elsewhere classified: Secondary | ICD-10-CM | POA: Diagnosis not present

## 2022-03-04 DIAGNOSIS — E43 Unspecified severe protein-calorie malnutrition: Secondary | ICD-10-CM | POA: Diagnosis not present

## 2022-03-04 DIAGNOSIS — R1310 Dysphagia, unspecified: Secondary | ICD-10-CM | POA: Diagnosis not present

## 2022-03-04 DIAGNOSIS — Z452 Encounter for adjustment and management of vascular access device: Secondary | ICD-10-CM | POA: Diagnosis not present

## 2022-03-04 DIAGNOSIS — K2270 Barrett's esophagus without dysplasia: Secondary | ICD-10-CM | POA: Diagnosis not present

## 2022-03-04 DIAGNOSIS — E1142 Type 2 diabetes mellitus with diabetic polyneuropathy: Secondary | ICD-10-CM | POA: Diagnosis not present

## 2022-03-06 NOTE — Progress Notes (Signed)
Office Visit Note  Patient: Kayla [name]             Date of Birth: [birth year]           MRN: [id number]             PCP: [facility] Referring: [name], MD Visit Date: 03/20/2022 Occupation: '@GUAROCC'$ @  Subjective:  Generalized pain and treatment of osteoporosis  History of Present Illness: Kayla [name] is a 77 y.o. female with history of osteoarthritis, degenerative disc disease, osteoporosis and fibromyalgia syndrome.  She is followed by [facility].  She had cervical nerve block recently.  She may require ablation.  History of trapezius pain.  Lower back pain continues but it is manageable.  He has gained weight on TPN.  History of some trochanteric bursitis.  She states left trochanteric bursa is more painful and has difficulty sleeping on that side.  She continues to have generalized pain and discomfort from fibromyalgia.  She is having treatment for peripheral neuropathy by pain management.  She is planning to have reversal of gastric bypass surgery in the future.    Activities of Daily Living:  Patient reports morning stiffness for 2 hours.   Patient Reports nocturnal pain.  Difficulty dressing/grooming: Denies Difficulty climbing stairs: Reports Difficulty getting out of chair: Reports Difficulty using hands for taps, buttons, cutlery, and/or writing: Reports  Review of Systems  Constitutional:  Positive for fatigue.  HENT:  Positive for mouth dryness.   Eyes:  Positive for dryness.  Respiratory:  Negative for shortness of breath.   Cardiovascular:  Negative for chest pain and palpitations.  Gastrointestinal:  Positive for constipation. Negative for blood in stool and diarrhea.  Endocrine: Negative for increased urination.  Genitourinary:  Negative for decreased urine output.  Musculoskeletal:  Positive for joint pain, gait problem, joint pain, myalgias, morning stiffness and myalgias.  Skin:  Negative for color  change, rash and sensitivity to sunlight.  Allergic/Immunologic: Negative for susceptible to infections.  Neurological:  Negative for fainting.  Hematological:  Negative for swollen glands.  Psychiatric/Behavioral:  Positive for depressed mood and sleep disturbance. The patient is nervous/anxious.     PMFS History:  Patient Active Problem List   Diagnosis Date Noted   Iron deficiency anemia due to dietary causes 04/18/2021   Peripheral neuropathy 04/03/2021   Chronic kidney disease 03/22/2021   Macular degeneration 03/22/2021   PTSD (post-traumatic stress disorder)    Epigastric pain 11/11/2019   Anorexia nervosa 11/11/2019   LLQ abdominal pain 11/11/2019   Malnutrition 11/11/2019   Mechanical breakdown implanted electronic neurostimulator spinal cord 11/16/2018   Esophageal dysphagia 10/03/2018   Neuropathic pain 01/23/2018   MVP (mitral valve prolapse) 12/17/2017   Rosacea 11/02/2017   Small intestinal bacterial overgrowth 08/05/2017   Orthostatic hypotension 11/20/2016   Type II diabetes mellitus 11/18/2016   Hypoglycemia 11/08/2016   Cervical facet joint syndrome 10/08/2016   Anal fissure 10/08/2016   Peripheral vascular disease 01/22/2016   Hypertension 01/22/2016   History of hyperparathyroidism 01/22/2016   Mitral regurgitation 12/11/2015   History of gastric bypass 02/11/2014   Chronic maxillary sinusitis 09/15/2013   Personal history of colonic polyps 07/22/2013   HA (headache)-chronic/tension type 07/28/2012   Osteoporosis 07/28/2012   Nephrolithiasis 07/28/2012   Fibromyalgia 07/26/2012   Chronic interstitial cystitis 10/16/2011   Chronic constipation 10/04/2010   Loss of weight 11/27/2009   Generalized anxiety disorder 03/18/2007   Adjustment disorder with depressed mood  03/18/2007   GERD (gastroesophageal reflux disease) 03/18/2007   Diaphragmatic hernia 03/18/2007   Osteoarthritis 03/18/2007   Degeneration of cervical intervertebral disc 03/18/2007    Degeneration of lumbar or lumbosacral intervertebral disc 03/18/2007   Myalgia 03/18/2007    Past Medical History:  Diagnosis Date   Adjustment disorder with depressed mood 03/18/2007   Allergy    Anal fissure 10/08/2016   Angina    takes Propanolol prn and it stops her chest   Anorexia nervosa 11/11/2019   Barrett esophagus    not consistently present   Cataract    removed  but had retinal detachment - repeat cataract right eye   Cervical facet joint syndrome 10/08/2016   Chronic constipation 10/04/2010   Chronic interstitial cystitis 10/16/2011   Chronic kidney disease    kidney stones and UTI   Chronic maxillary sinusitis 09/15/2013   Chronic narcotic dependence [id number]   Complication of anesthesia    either  BP or pulse dropped with last surgery   Degeneration of cervical intervertebral disc 03/18/2007   Degeneration of lumbar or lumbosacral intervertebral disc 03/18/2007   Diaphragmatic hernia 03/18/2007   Dysrhythmia    brought on by stress   Epigastric pain 11/11/2019   Esophageal dysphagia 10/03/2018   External hemorrhoids    Fibromyalgia    neuropathy knees ankles and toes, bladder   Generalized anxiety disorder 03/18/2007   GERD (gastroesophageal reflux disease) 03/18/2007   HA (headache)-chronic/tension type 07/28/2012   Heart murmur    Hiatal hernia    History of anemia    History of gastric bypass 02/11/2014   1999 at [facility]   History of hyperparathyroidism 01/22/2016   History of kidney stones    Hypertension    Hypoglycemia 11/08/2016   IBS (irritable bowel syndrome)    Internal hemorrhoids    LLQ abdominal pain 11/11/2019   Macular degeneration    Malnutrition 11/11/2019   Mechanical breakdown implanted electronic neurostimulator spinal cord 11/16/2018   Mitral regurgitation 12/11/2015   MVP (mitral valve prolapse) 12/17/2017   Myalgia 03/18/2007   Nephrolithiasis 07/28/2012   lithotrip 2013-Eskridge,MD   Neuropathic pain 01/23/2018    Orthostatic hypotension 11/20/2016   Osteoarthritis    all over   Osteopetrosis    Osteoporosis 07/28/2012   Peripheral vascular disease    superficial phlebitis in left calf   Personal history of colonic polyps 07/22/2013   07/2013 - 3 small adenomas - repeat colonoscopy 2018   PTSD (post-traumatic stress disorder)    Rosacea 11/02/2017   Small intestinal bacterial overgrowth 08/05/2017   + lactulose breath test 06/2017 Xifaxan Rx   Type II diabetes mellitus 11/18/2016    Family History  Problem Relation Age of Onset   Stroke Mother    Lung cancer Mother    Heart disease Mother    Diabetes Mother    Hypertension Father    Stroke Father    Heart disease Father    Kidney disease Father    Seizures Father        epilepsy, and sisters x 2   Memory loss Father    Lung cancer Sister    Breast cancer Sister    Cancer - Lung Sister    Heart disease Sister    Diabetes Sister    Pancreatic cancer Sister    Healthy Daughter    Healthy Son    Colon cancer Maternal Aunt        12 relatives   Colon cancer Paternal Aunt  Colon cancer Paternal Aunt    Uterine cancer Other        aunt   Heart disease Other        grandmother   Esophageal cancer Neg Hx    Rectal cancer Neg Hx    Stomach cancer Neg Hx    Colon polyps Neg Hx    Past Surgical History:  Procedure Laterality Date   ABDOMINAL HYSTERECTOMY     APPENDECTOMY     CARDIAC CATHETERIZATION  03/22/1991   EF 61%   CARDIOVASCULAR STRESS TEST  10/03/2006   CERVICAL SPINE SURGERY  02/14/2022   BLOCK   CHOLECYSTECTOMY     COLONOSCOPY  06/23/2008   normal   DILATION AND CURETTAGE OF UTERUS     x2   ESOPHAGUS SURGERY     EXPLORATORY LAPAROTOMY  1993   EYE SURGERY     GASTRIC BYPASS  1999   GASTRIC RESTRICTION SURGERY  1991   LASIK Bilateral    Right Arm Surgery     Right Knee Arthroscopy     Rt wrist fx  2009   spinal surgery battery implant     stomach stappeling  1991   STONE EXTRACTION WITH BASKET  2012    TONSILLECTOMY     TUBAL LIGATION     UPPER GASTROINTESTINAL ENDOSCOPY  06/23/2008   w/Dil, Barrett's esophagus   US ECHOCARDIOGRAPHY  01/21/2007   EF 55-60%   US ECHOCARDIOGRAPHY  08/30/2003   EF 55-60%   Social History   Social History Narrative   Right handed   Two story home   She retired on disability   Married   2 Children   Immunization History  Administered Date(s) Administered   Hepatitis B, ADULT 04/21/2017   Influenza Split 10/02/2011   Influenza Whole 08/21/2007   Influenza, High Dose Seasonal PF 12/29/2017   Influenza,inj,Quad PF,6+ Mos 12/15/2013, 11/23/2014, 09/27/2015, 10/04/2016   Influenza-Unspecified 12/15/2013, 11/23/2014, 09/27/2015, 10/04/2016   PFIZER(Purple Top)SARS-COV-2 Vaccination 03/31/2019, 04/20/2019   Pneumococcal Conjugate-13 09/05/2014   Pneumococcal Polysaccharide-23 10/02/2011, 02/08/2013   Td 01/14/2006   Tdap 01/22/2016     Objective: Vital Signs: BP 116/72 (BP Location: Left Arm, Patient Position: Sitting, Cuff Size: Normal)   Pulse 88   Resp 16   Ht '5\' 5"'$  (1.651 m)   Wt 129 lb (58.5 kg)   BMI 21.47 kg/m    Physical Exam Vitals and nursing note reviewed.  Constitutional:      Appearance: She is well-developed.  HENT:     Head: Normocephalic and atraumatic.  Eyes:     Conjunctiva/sclera: Conjunctivae normal.  Cardiovascular:     Rate and Rhythm: Normal rate and regular rhythm.     Heart sounds: Normal heart sounds.  Pulmonary:     Effort: Pulmonary effort is normal.     Breath sounds: Normal breath sounds.  Abdominal:     General: Bowel sounds are normal.     Palpations: Abdomen is soft.  Musculoskeletal:     Cervical back: Normal range of motion.  Lymphadenopathy:     Cervical: No cervical adenopathy.  Skin:    General: Skin is warm and dry.     Capillary Refill: Capillary refill takes less than 2 seconds.  Neurological:     Mental Status: She is alert and oriented to person, place, and time.  Psychiatric:         Behavior: Behavior normal.      Musculoskeletal Exam: He had limited painful range of motion of the  cervical spine and lumbar spine.  Shoulder joints, elbow joints, wrist joints, MCPs PIPs and DIPs been good range of motion with mild thickening of PIP and DIP joints.  Hip joints and knee joints with good range of motion.  She had no tenderness over ankles or MTPs.  She had generalized hyperalgesia and positive tender points.  CDAI Exam: CDAI Score: -- Patient Global: --; Provider Global: -- Swollen: --; Tender: -- Joint Exam 03/20/2022   No joint exam has been documented for this visit   There is currently no information documented on the homunculus. Go to the Rheumatology activity and complete the homunculus joint exam.  Investigation: No additional findings.  Imaging: No results found.  Recent Labs: Lab Results  Component Value Date   WBC 9.0 03/19/2022   HGB 14.2 03/19/2022   PLT 271 03/19/2022   NA 141 03/19/2022   K 5.5 (H) 03/19/2022   CL 103 03/19/2022   CO2 33 (H) 03/19/2022   GLUCOSE 88 03/19/2022   BUN 31 (H) 03/19/2022   CREATININE 0.57 (L) 03/19/2022   BILITOT 0.3 03/19/2022   ALKPHOS 50 11/30/2021   AST 29 03/19/2022   ALT 28 03/19/2022   PROT 5.4 (L) 03/19/2022   ALBUMIN 3.7 11/30/2021   CALCIUM 9.2 03/19/2022   GFRAA 95 05/05/2020    Speciality Comments: Fosamax 2013-2018 Patient declined Tymlos and Forteo-travels frequently. Prolia May 26, 2020, January 26, 2021  Procedures:  No procedures performed Allergies: Ambien [zolpidem tartrate], Codeine, Oxycodone-acetaminophen, Tylox [oxycodone-acetaminophen], Zolpidem tartrate, Darvocet [propoxyphene n-acetaminophen], Darvon [propoxyphene], Lorcet [hydrocodone-acetaminophen], Opium, Vicodin [hydrocodone-acetaminophen], Wheat bran, Amoxicillin, and Penicillins   Assessment / Plan:     Visit Diagnoses: Age-related osteoporosis without current pathological fracture - DEXA on 01/11/20: BMD measured at AP  spine L1-L4 is 0.723 with a T-score of -3.8. Current therapy: Prolia injections: 01/26/2021,05/26/20, 01/26/2021, 08/03/2021.  Patient needs a repeat Prolia injection.  She is past due for Prolia injection.  The Prolia injection was due in January 2023.  She also needs a repeat DEXA scan.  Vitamin D deficiency - Vitamin D was low at 47 on March 19, 2022.  Patient states that she has been taking vitamin D 50,000 units once a month.  I advised her to take vitamin D 50,000 units once a week for the next 3 months and then we will repeat vitamin D level.  Medication management-Labs obtained on March 19, 2022 CBC and CMP were normal.  DDD (degenerative disc disease), cervical-she had limited painful range of motion of the cervical spine.  She had recent nerve block by the pain management.    Trapezius muscle spasm-she continues to have bilateral trapezius spasm.  DDD (degenerative disc disease), lumbar-chronic lower back pain.  She is followed by pain management.  Trochanteric bursitis of both hips-she continues to have tenderness over trochanteric bursa.  IT band stretches were emphasized.  Fibromyalgia -she continues to have generalized hyperalgesia and positive tender points.  She goes to pain management.  She remains on Lyrica and Cymbalta as prescribed.  Chronic pain syndrome -followed at pain management.  She uses fentanyl patches, hydrocodone, and Celebrex for pain relief.  She is also taking Lyrica as prescribed.  Muscle cramps-she continues to have muscle cramps.  Stretching exercises were emphasized.  Other fatigue-secondary to fibromyalgia.  Other medical problems listed as follows:  Anxiety and depression  IC (interstitial cystitis)  Hypoglycemia  History of gastroesophageal reflux (GERD)  Orders: Orders Placed This Encounter  Procedures   VITAMIN D 25 Hydroxy (  Vit-D Deficiency, Fractures)   Meds ordered this encounter  Medications   Vitamin D, Ergocalciferol, (DRISDOL) 1.25  MG (50000 UNIT) CAPS capsule    Sig: Take 1 capsule (50,000 Units total) by mouth every 7 (seven) days.    Dispense:  12 capsule    Refill:  0     Follow-Up Instructions: Return in about 5 months (around 08/20/2022) for Osteoporosis, Osteoarthritis.   Bo Merino, MD  Note - This record has been created using Editor, commissioning.  Chart creation errors have been sought, but may not always  have been located. Such creation errors do not reflect on  the standard of medical care.

## 2022-03-08 ENCOUNTER — Ambulatory Visit: Admitting: Rheumatology

## 2022-03-08 ENCOUNTER — Other Ambulatory Visit (HOSPITAL_BASED_OUTPATIENT_CLINIC_OR_DEPARTMENT_OTHER): Payer: Self-pay

## 2022-03-08 DIAGNOSIS — R5383 Other fatigue: Secondary | ICD-10-CM

## 2022-03-08 DIAGNOSIS — M7061 Trochanteric bursitis, right hip: Secondary | ICD-10-CM

## 2022-03-08 DIAGNOSIS — M5030 Other cervical disc degeneration, unspecified cervical region: Secondary | ICD-10-CM

## 2022-03-08 DIAGNOSIS — E559 Vitamin D deficiency, unspecified: Secondary | ICD-10-CM

## 2022-03-08 DIAGNOSIS — M792 Neuralgia and neuritis, unspecified: Secondary | ICD-10-CM

## 2022-03-08 DIAGNOSIS — G894 Chronic pain syndrome: Secondary | ICD-10-CM

## 2022-03-08 DIAGNOSIS — F419 Anxiety disorder, unspecified: Secondary | ICD-10-CM

## 2022-03-08 DIAGNOSIS — M797 Fibromyalgia: Secondary | ICD-10-CM

## 2022-03-08 DIAGNOSIS — Z8719 Personal history of other diseases of the digestive system: Secondary | ICD-10-CM

## 2022-03-08 DIAGNOSIS — E162 Hypoglycemia, unspecified: Secondary | ICD-10-CM

## 2022-03-08 DIAGNOSIS — N3010 Interstitial cystitis (chronic) without hematuria: Secondary | ICD-10-CM

## 2022-03-08 DIAGNOSIS — M810 Age-related osteoporosis without current pathological fracture: Secondary | ICD-10-CM

## 2022-03-08 DIAGNOSIS — R252 Cramp and spasm: Secondary | ICD-10-CM

## 2022-03-08 DIAGNOSIS — Z79899 Other long term (current) drug therapy: Secondary | ICD-10-CM

## 2022-03-08 DIAGNOSIS — M5136 Other intervertebral disc degeneration, lumbar region: Secondary | ICD-10-CM

## 2022-03-08 DIAGNOSIS — M62838 Other muscle spasm: Secondary | ICD-10-CM

## 2022-03-11 DIAGNOSIS — K2270 Barrett's esophagus without dysplasia: Secondary | ICD-10-CM | POA: Diagnosis not present

## 2022-03-11 DIAGNOSIS — Z452 Encounter for adjustment and management of vascular access device: Secondary | ICD-10-CM | POA: Diagnosis not present

## 2022-03-11 DIAGNOSIS — K912 Postsurgical malabsorption, not elsewhere classified: Secondary | ICD-10-CM | POA: Diagnosis not present

## 2022-03-11 DIAGNOSIS — R1310 Dysphagia, unspecified: Secondary | ICD-10-CM | POA: Diagnosis not present

## 2022-03-11 DIAGNOSIS — E1142 Type 2 diabetes mellitus with diabetic polyneuropathy: Secondary | ICD-10-CM | POA: Diagnosis not present

## 2022-03-11 DIAGNOSIS — E43 Unspecified severe protein-calorie malnutrition: Secondary | ICD-10-CM | POA: Diagnosis not present

## 2022-03-12 ENCOUNTER — Ambulatory Visit: Payer: Medicare Other | Attending: Cardiovascular Disease | Admitting: Cardiovascular Disease

## 2022-03-12 ENCOUNTER — Ambulatory Visit

## 2022-03-12 ENCOUNTER — Encounter: Payer: Self-pay | Admitting: Cardiovascular Disease

## 2022-03-12 VITALS — BP 118/68 | HR 92 | Ht 65.0 in | Wt 127.0 lb

## 2022-03-12 DIAGNOSIS — I2510 Atherosclerotic heart disease of native coronary artery without angina pectoris: Secondary | ICD-10-CM

## 2022-03-12 DIAGNOSIS — I341 Nonrheumatic mitral (valve) prolapse: Secondary | ICD-10-CM | POA: Diagnosis not present

## 2022-03-12 NOTE — Progress Notes (Signed)
[name] Date of Birth  [birth year]       [facility] Office [address], [address], [place], [place]  [address]   [place], [place]  [address] [phone]     [phone]   Fax  [phone]    Fax [phone]  Problem List: 1. Mitral valve prolapse-mitral regurgitation 2. Hypertension 3. Barrett's esophagus 4. Fibromyalgia 5. History chest pain-she had a normal heart catheterization in 1993. Stress Myoview study in 2008 was normal. 6. Brain aneurism -  7. S/p Roux-en-Y surgery  for Barretts esophagus  Previous notes  Britthany continues to have chest pain.  She takes PRN propranolol which seems to help.  It typically occurs in the afternoon.  Almost always with rest.  She does not get any regular exercise.  She has had lots of bladder infections due to kidney stones recently that have not permitted her to exercise.  She's had lots of problems with anxiety.  July 15, 2014:  Meerab presents for evaluation Her BP has been very low - possibly due to the various medications .  She has had left sided chest pain for years.  Has had a normal cath in the remote past and had a normal myoview  Several years ago .   She was recently seen by Dr. Newman Pies for some palpitations The palpitations would come and go .  Off and on for hours.  Was started on atenolol which has helped the palpitations. She had taken atenolol for years ( many years ago) but was off atenolol for several years.  Her palpitations have resolved after starting the atenolol .    These episodes of tachycardia are at times associated with episodes of CP . Has lost a lot of weight over the past several years. She had a Roux -in - Y gastric bypass ( supposedly to prevent esophageal cancer from Barretts esophagutis. Is not able to eat much.  Can eat 1/4 cup of food at a time .    Sept. 19, 2016:  Continues to have palpitations  - improve with  propranolol Continues to have poor appetite.  Is generally very weak Continues to see her primary medical doctor and her pain management doctor .   Nov. 27,2017:  Seems to be doing well from a cardiac standpoint. Having urology issues.  May need a bladder tack. Still has   Nov. 7, 2018 Has been diagnosed with DM and has diabetic neuropathy.   Lots of pain in her feet.   Still has some CP Still has some palpittations  .   Has lost 40 lbs over the past 10 years.  Admitted that she is not eating well.   July 02, 2017: Follow up for MVP and noncardiac CP  Hx of HTN but has been low for the past several years.   Doing well. Now has a Freestyle Libra glucose monitor  Doing well with that  Continues to lose weight Wt is 110 lbs. Down 6 lbs from last Nov.  Not eating well.   Needs to eat more protein   December 17, 2017: Adrianny is seen today for follow-up visit.  She is had problems with orthostatic hypotension.  We felt that she may have some degree of dysautonomia.  It is clear that her diet is not very good. Her weight today is 101 pounds.  This is down 9 pounds from her previous visit.  Her symptoms of orthostasis  have improved Develops palpitations after eating   May 05, 2019:  Sibel is seen today for follow up of her MVP and orthostatic hypotension .  Wt is 108 lbs today ( up 7 lbs from last visit ) , has some leg swelling  She took lasix yesterday  Advised her to elevated her legs and consider compression hose.  She saw her primary care yesterday  Had labs drawn yesterday  Has some mild chest pain on occasion.     Sept. 7, 2021:  Curtrina is seen today for follow up of her MVP and orthostatic hypotension. She has had some leg edema - we discussed leg elevation and compression hose at her last visit She has continued to have ankle  swelling  Better with the lasix and potassium  Seems to accumulate through the day  Has hx of superficial phlebitis  Still limited  by peripheral neuropathy.  Wt is 103 lbs ( down 5 lbs from last visit )    Feb. 15, 2023 Amarilys is seen today for MVP, orthostatic hypotension   Wt is 99 lbs ( down 4 lbs from last visit )  Lots of issues with peripheral neuropathy   Has a spina nerve stimulator.  Is not working as well as she would have hoped.   March 12, 2022: Makylia is seen today for follow-up of her mitral valve prolapse and orthostatic hypotension.  Her weight today is 127 pounds Is on TPN now Is feeling better  She needs to have more GI surgery to correct adhesions and to re-route the rou-en-Y bypass. She needs cardiac clearance for this abdomina surgery  Has been having some chest pain  - thinks its due to stress Has had many normal stress myoviews over the years ( most recent was July 2022 )    Current Outpatient Medications on File Prior to Visit  Medication Sig Dispense Refill   aspirin-acetaminophen-caffeine (EXCEDRIN MIGRAINE) 250-250-65 MG tablet Take 2 tablets by mouth 2 (two) times daily.     Biotin 1000 MCG CHEW Chew 1 tablet by mouth daily.     butalbital-acetaminophen-caffeine (FIORICET) 50-325-40 MG tablet Take 1 tablet by mouth every 6 (six) hours as needed for headache. 10 tablet 2   CALCIUM PO Take by mouth daily.     celecoxib (CELEBREX) 200 MG capsule Take 200 mg by mouth 2 (two) times daily.     clonazePAM (KLONOPIN) 0.5 MG tablet Take 0.25 mg by mouth as needed.     COLLAGEN PO Take by mouth daily.     Continuous Blood Gluc Receiver ([place]) DEVI by Does not apply route.     Copper Gluconate POWD Take by mouth.     cyclobenzaprine (FLEXERIL) 10 MG tablet Take by mouth.     cycloSPORINE (RESTASIS) 0.05 % ophthalmic emulsion Place 1 drop into both eyes 2 (two) times daily. 5.5 mL 5   diclofenac sodium (VOLTAREN) 1 % GEL Apply 2 g topically 4 (four) times daily. 700 g 3   dicyclomine (BENTYL) 10 MG capsule Take 1-2 tab 3 times daily AC as needed for spasms and cramping. 270  capsule 1   Docusate Sodium (DSS) 100 MG CAPS Take by mouth.     Erenumab-aooe 70 MG/ML SOAJ Inject into the skin.     famotidine (PEPCID) 20 MG tablet Take by mouth.     fentaNYL (DURAGESIC) 25 MCG/HR Place 1 patch onto the skin every 3 (three) days. 10 patch 0   furosemide (LASIX) 40 MG  tablet Take by mouth.     Galcanezumab-gnlm (EMGALITY) 120 MG/ML SOAJ Inject 120 mg into the skin every 28 (twenty-eight) days. 3 pen 3   glucagon 1 MG injection Inject 1 mg into the muscle once as needed for up to 1 dose. 1 each 12   HYDROcodone-acetaminophen (NORCO) 10-325 MG tablet Take 1/2 to 1 tablet four (4) times daily, if needed for pain, max daily dose: 4 tablets 60 tablet 0   hydrocortisone cream 1 % Apply topically.     hypromellose (GENTEAL) 0.3 % GEL ophthalmic ointment Place 1 drop into both eyes at bedtime. Reported on 02/11/2015     latanoprost (XALATAN) 0.005 % ophthalmic solution Apply to eye.     lidocaine (LIDODERM) 5 % Place 1 patch onto the skin as needed. Remove & Discard patch within 12 hours. May dispense as 3 month supply 90 patch 3   Lifitegrast 5 % SOLN Place 1 drop into both eyes daily.     magnesium hydroxide (MILK OF MAGNESIA) 400 MG/5ML suspension Take by mouth.     metroNIDAZOLE (METROGEL) 1 % gel Apply topically daily. 45 g 0   mirabegron ER (MYRBETRIQ) 25 MG TB24 tablet Take 1 tablet (25 mg total) by mouth daily. 90 tablet 1   Multiple Vitamins-Minerals (PRESERVISION AREDS) CAPS Take 1 capsule by mouth 2 (two) times daily.      naloxegol oxalate (MOVANTIK) 25 MG TABS tablet Take 25 mg by mouth daily.     Naloxone HCl 0.4 MG/0.4ML SOAJ Administer 0.'4mg'$  [place] at first sign of opioid overdose and repeat every 2 minutes as needed for resuscitation. Call 911 immediately 5 Package [place]  Peripheral Neuropathy Cream- Bupivacaine 1%, Doxepin 3%, Gabapentin 6%, Pentoxifylline 3%, Topiramate 1% Apply 1-2 grams to affected area 3-4 times daily Qty. 120 gm 3  refills     NON FORMULARY GABA6%LIDO5%DICL3%     nystatin-triamcinolone (MYCOLOG II) cream      ondansetron (ZOFRAN) 8 MG tablet Take 1 tablet (8 mg total) by mouth every 8 (eight) hours as needed for nausea or vomiting. 60 tablet 0   pantoprazole (PROTONIX) 40 MG tablet Take by mouth.     polyethylene glycol (MIRALAX / GLYCOLAX) 17 g packet Take by mouth.     potassium chloride SA (KLOR-CON M) 20 MEQ tablet Take by mouth.     pregabalin (LYRICA) 50 MG capsule Take 1 capsule in morning and 2 capsules at night 90 capsule 5   Probiotic Product (PROBIOTIC-10 PO) Take by mouth.      senna-docusate (SENOKOT-S) 8.6-50 MG tablet Take by mouth.     sucralfate (CARAFATE) 1 GM/10ML suspension Take 10 mLs (1 g total) by mouth 4 (four) times daily. 420 mL 1   sulfamethoxazole-trimethoprim (BACTRIM DS) 800-160 MG tablet Take 1 tablet by mouth 2 (two) times daily.     SUMAtriptan (IMITREX) 100 MG tablet Take 1 tablet at earliest onset of headache, may repeat in 2 hours if headache persists or reoccurs. 30 tablet 1   traZODone (DESYREL) 50 MG tablet Take 50 mg by mouth at bedtime.     cyanocobalamin (,VITAMIN B-12,) 1000 MCG/ML injection SMARTSIG:1 Vial(s) IM Once a Month (Patient not taking: Reported on 03/12/2022)     phenazopyridine (PYRIDIUM) 95 MG tablet Take by mouth. (Patient not taking: Reported on 03/12/2022)     No current facility-administered medications on file prior to visit.    Allergies  Allergen Reactions   Ambien [Zolpidem Tartrate]  Hallucinations   Codeine Anaphylaxis   Oxycodone-Acetaminophen Shortness Of Breath   Tylox [Oxycodone-Acetaminophen] Anaphylaxis    Chest pain   Zolpidem Tartrate Anaphylaxis and Other (See Comments)    Other Reaction(s): Hallucination  Other reaction(s): Other, hallucinations, Other reaction(s): Other, hallucinations, hallucinations   Darvocet [Propoxyphene N-Acetaminophen]     Chest pain   Darvon [Propoxyphene] Itching   Lorcet  [Hydrocodone-Acetaminophen]     Chest pain   Opium     hallucinations   Vicodin [Hydrocodone-Acetaminophen] Other (See Comments)    Chest Pain    Wheat Bran    Amoxicillin Nausea And Vomiting    Reaction:  Migraine headache   Penicillins Nausea And Vomiting    Migraine Headaches    Past Medical History:  Diagnosis Date   Adjustment disorder with depressed mood 03/18/2007   Allergy    Anal fissure 10/08/2016   Angina    takes Propanolol prn and it stops her chest   Anorexia nervosa 11/11/2019   Barrett esophagus    not consistently present   Cataract    removed  but had retinal detachment - repeat cataract right eye   Cervical facet joint syndrome 10/08/2016   Chronic constipation 10/04/2010   Chronic interstitial cystitis 10/16/2011   Chronic kidney disease    kidney stones and UTI   Chronic maxillary sinusitis 09/15/2013   Chronic narcotic dependence [id number]   Complication of anesthesia    either  BP or pulse dropped with last surgery   Degeneration of cervical intervertebral disc 03/18/2007   Degeneration of lumbar or lumbosacral intervertebral disc 03/18/2007   Diaphragmatic hernia 03/18/2007   Dysrhythmia    brought on by stress   Epigastric pain 11/11/2019   Esophageal dysphagia 10/03/2018   External hemorrhoids    Fibromyalgia    neuropathy knees ankles and toes, bladder   Generalized anxiety disorder 03/18/2007   GERD (gastroesophageal reflux disease) 03/18/2007   HA (headache)-chronic/tension type 07/28/2012   Heart murmur    Hiatal hernia    History of anemia    History of gastric bypass 02/11/2014   [facility] at [facility]   History of hyperparathyroidism 01/22/2016   History of kidney stones    Hypertension    Hypoglycemia 11/08/2016   IBS (irritable bowel syndrome)    Internal hemorrhoids    LLQ abdominal pain 11/11/2019   Macular degeneration    Malnutrition 11/11/2019   Mechanical breakdown implanted electronic neurostimulator spinal cord  11/16/2018   Mitral regurgitation 12/11/2015   MVP (mitral valve prolapse) 12/17/2017   Myalgia 03/18/2007   Nephrolithiasis 07/28/2012   lithotrip 2013-[place],MD   Neuropathic pain 01/23/2018   Orthostatic hypotension 11/20/2016   Osteoarthritis    all over   Osteopetrosis    Osteoporosis 07/28/2012   Peripheral vascular disease    superficial phlebitis in left calf   Personal history of colonic polyps 07/22/2013   07/2013 - 3 small adenomas - repeat colonoscopy 2018   PTSD (post-traumatic stress disorder)    Rosacea 11/02/2017   Small intestinal bacterial overgrowth 08/05/2017   + lactulose breath test 06/2017 Xifaxan Rx   Type II diabetes mellitus 11/18/2016    Past Surgical History:  Procedure Laterality Date   ABDOMINAL HYSTERECTOMY     APPENDECTOMY     CARDIAC CATHETERIZATION  03/22/1991   EF 61%   CARDIOVASCULAR STRESS TEST  10/03/2006   CHOLECYSTECTOMY     COLONOSCOPY  06/23/2008   normal   DILATION AND CURETTAGE OF UTERUS     x2  ESOPHAGUS SURGERY     EXPLORATORY LAPAROTOMY  1993   EYE SURGERY     GASTRIC BYPASS  1999   GASTRIC RESTRICTION SURGERY  1991   LASIK Bilateral    Right Arm Surgery     Right Knee Arthroscopy     Rt wrist fx  2009   spinal surgery battery implant     stomach stappeling  1991   STONE EXTRACTION WITH BASKET  2012   TONSILLECTOMY     TUBAL LIGATION     UPPER GASTROINTESTINAL ENDOSCOPY  06/23/2008   w/Dil, Barrett's esophagus   US ECHOCARDIOGRAPHY  01/21/2007   EF 55-60%   US ECHOCARDIOGRAPHY  08/30/2003   EF 55-60%    Social History   Tobacco Use  Smoking Status Never   Passive exposure: Past  Smokeless Tobacco Never    Social History   Substance and Sexual Activity  Alcohol Use No    Family History  Problem Relation Age of Onset   Stroke Mother    Lung cancer Mother    Heart disease Mother    Diabetes Mother    Hypertension Father    Stroke Father    Heart disease Father    Kidney disease Father    Seizures  Father        epilepsy, and sisters x 2   Memory loss Father    Lung cancer Sister    Breast cancer Sister    Cancer - Lung Sister    Heart disease Sister    Diabetes Sister    Pancreatic cancer Sister    Healthy Daughter    Healthy Son    Colon cancer Maternal Aunt        12 relatives   Colon cancer Paternal Aunt    Colon cancer Paternal Aunt    Uterine cancer Other        aunt   Heart disease Other        grandmother   Esophageal cancer Neg Hx    Rectal cancer Neg Hx    Stomach cancer Neg Hx    Colon polyps Neg Hx     Reviw of Systems:  Reviewed in the HPI.  All other systems are negative.    Physical Exam: Blood pressure 118/68, pulse 92, height '5\' 5"'$  (1.651 m), weight 127 lb (57.6 kg).       GEN:  Well nourished, well developed in no acute distress HEENT: Normal NECK: No JVD; No carotid bruits LYMPHATICS: No lymphadenopathy CARDIAC: RRR , soft systolic murmur  RESPIRATORY:  Clear to auscultation without rales, wheezing or rhonchi  ABDOMEN: Soft, non-tender, non-distended MUSCULOSKELETAL:  No edema; No deformity  SKIN: Warm and dry NEUROLOGIC:  Alert and oriented x 3     ECG: Feb. 27, 2024:  NSR  , normal      Assessment / Plan:   1.  Orthostatic hypotension:   stable     2.  Chest pain:    We have done numerous stress Myoview studies over the past years.  All of them have been normal. She needs preoperative clearance before she has redo gastrointestinal surgery.  She has a she has had a Roux-en-Y surgery in the past for severe hiatal hernia.  She needs now needs to have that reversed because she is suffering from malnutrition.  She has been using TPN.  I would like to get an echocardiogram prior to giving her cardiac clearance.  If her echocardiogram is normal then I think that she  should be at low risk for her GI surgery.   3.    Leg swelling :       4.  MVP :   stable      Mertie Moores, MD  03/12/2022 11:17 AM    [address],  [place], [place]  [address] Pager [phone] Phone: [phone]; Fax: [phone]

## 2022-03-12 NOTE — Patient Instructions (Signed)
Medication Instructions:  Your physician recommends that you continue on your current medications as directed. Please refer to the Current Medication list given to you today.  *If you need a refill on your cardiac medications before your next appointment, please call your pharmacy*   Lab Work: NONE If you have labs (blood work) drawn today and your tests are completely normal, you will receive your results only by: [place] (if you have MyChart) OR A paper copy in the mail If you have any lab test that is abnormal or we need to change your treatment, we will call you to review the results.   Testing/Procedures: ECHO Your physician has requested that you have an echocardiogram. Echocardiography is a painless test that uses sound waves to create images of your heart. It provides your doctor with information about the size and shape of your heart and how well your heart's chambers and valves are working. This procedure takes approximately one hour. There are no restrictions for this procedure. Please do NOT wear cologne, perfume, aftershave, or lotions (deodorant is allowed). Please arrive 15 minutes prior to your appointment time.  Follow-Up: At [facility], you and your health needs are our priority.  As part of our continuing mission to provide you with exceptional heart care, we have created designated Provider Care Teams.  These Care Teams include your primary Cardiologist (physician) and Advanced Practice Providers (APPs -  Physician Assistants and Nurse Practitioners) who all work together to provide you with the care you need, when you need it.  Your next appointment:   1 year(s)  Provider:   Mertie Moores, MD

## 2022-03-18 DIAGNOSIS — R1310 Dysphagia, unspecified: Secondary | ICD-10-CM | POA: Diagnosis not present

## 2022-03-18 DIAGNOSIS — E43 Unspecified severe protein-calorie malnutrition: Secondary | ICD-10-CM | POA: Diagnosis not present

## 2022-03-18 DIAGNOSIS — K2270 Barrett's esophagus without dysplasia: Secondary | ICD-10-CM | POA: Diagnosis not present

## 2022-03-18 DIAGNOSIS — E1142 Type 2 diabetes mellitus with diabetic polyneuropathy: Secondary | ICD-10-CM | POA: Diagnosis not present

## 2022-03-18 DIAGNOSIS — Z452 Encounter for adjustment and management of vascular access device: Secondary | ICD-10-CM | POA: Diagnosis not present

## 2022-03-18 DIAGNOSIS — K912 Postsurgical malabsorption, not elsewhere classified: Secondary | ICD-10-CM | POA: Diagnosis not present

## 2022-03-18 NOTE — Progress Notes (Unsigned)
NEUROLOGY FOLLOW UP OFFICE NOTE  AUNDRAYA ZELLMANN FM:[id number]  Assessment/Plan:   Migraine without aura, without status migrainosus, not intractable - increased frequency possibly related to supplements for TPN. Idiopathic polyneuropathy Chronic pain syndrome Subjective memory complaints   Wouldn't make any changes in management as increased migraines may be aggravated by the TPN which is only scheduled for another 6 weeks and adding another medication adds to side effects of polypharmacy. For neuralgia:  Lyrica '50mg'$  in am and '100mg'$  in pm.  Continue Cymbalta '30mg'$  twice daily.   Migraine prevention:  Emgality Migraine rescue:  sumatriptan.  Fioricet sparingly. Limit use of pain relievers to no more than 2 days out of week to prevent risk of rebound or medication-overuse headache. Keep headache diary Follow up 3 months     Subjective:  Tunisha Minicozzi is a 77 year old woman with fibromyalgia, type 2 diabetes, anxiety with history of PTSD, degenerative disc disease of cervical and lumbar spine, and history of nephrolithiasis and gastric bypass who follows up for polyneuropathy, migraine and memory complaints.  She is accompanied by her husband.   UPDATE: Migraine: Taking Emgality, Fioricet and sumatriptan, sometimes Excedrin TPN ***     Neuropathy/Chronic pain Taking Lyrica '50mg'$  in AM and '100mg'$  in PM, Cymbalta '30mg'$  BID Had increased Lyrica to '100mg'$  twice daily.  Caused side effects - foggy-headed, dropping objects.  Decreased dose back.     Memory Complaints She mentioned trouble with short term memory.  Neuropsychological evaluation on 03/23/2021 was within normal limits and likely he memory complaints are related to PTSD, chronic pain and poor sleep.     Current NSAIDS:  flurbiprofen; Celebrex Current analgesics:  Excedrin Migraine, Lidoderm, voltaren 1%, Fentanyl patch Current triptans:  Sumatriptan '100mg'$  Current ergotamine:  no Current anti-emetic:  Zofran '8mg'$  Current  muscle relaxants:  none Current anti-anxiolytic:  clonazepam Current sleep aide:  trazodone Current Antihypertensive medications:  Lasix Current Antidepressant medications:  Cymbalta '30mg'$  twice daily,  trazodone '25mg'$  infrequently for sleep Current Anticonvulsant medications:  Lyrica '50mg'$  in AM and '100mg'$  at night, Current anti-CGRP:  Emgality Current Vitamins/Herbal/Supplements:  turmeric Current Antihistamines/Decongestants:  no Other therapy:  Physical therapy, [facility]   Caffeine:  2 cups of coffee daily Alcohol:  no Smoker:  no Diet:  poor Exercise:  no Depression:  no; Anxiety:  yes Sleep hygiene:  poor Chronic pain syndrome:  Followed by pain management.  I have her on Lyrica.  She is prescribed Cymbalta by another provider.   HISTORY: I  Neuropathy:  Since July 2018, she has had increased swelling and burning numbness and tingling in the legs.  She had lower extremity vascular ABIs on 08/20/16, which was negative.  She has history of B12 deficiency but she takes injections and recent B12 level from 09/21/16 was over 2000.  Methylmalonic acid level was 209, RPR nonreactive, homocysteine 7.8.  Labs from 07/12/16 include normal SPEP, Sed Rate 2, CRP less than 0.3, and TSH 2.430 .  Vitamin D was 23 and she was advised to start supplementation.  Serum glucose has been 70s up to 110.  She also takes B6.  She also has history of weight loss.  She has fibromyalgia and was previously diagnosed with neuropathy by another neurologist.  She has a spinal cord stimulator.  She has been on gabapentin for many years.  It was briefly discontinued to see if it was contributing to the swelling.  She reported no significant change, so it was restarted (she takes '300mg'$  4 times  daily), although she thinks it helped a little bit.  NCV-EMG from 10/21/16 demonstrated chronic sensorimotor polyneuropathy of the predominantly axonal type as well as mild left median neuropathy at or distal to the wrist.  Other labs include B1  146.1, B6 10.7, Sed Rate 2, HIV negative, TSH 1.780, UPEP negative.  She went to [facility] for evaluation of neuropathy.  Selenium, Zinc, paraneoplastic panel and genetic testing Copper was low, so she was advised to start supplement.  B6 was still elevated despite stopping supplements 3 prior.     She was wondering if her neuropathy could be secondary to [employer] exposure.  Her husband was a [place] veteran and reports cases of spouses exposed to [employer] through their husband's semen.  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  Her neuropathic symptoms started shortly after her return.       II Migraine: Onset:  Since 1970, after her twin newborns passed away.  She has lived with life-long family-related stress Location:  Migraines are unilateral/parietal or bilateral retro-orbital, chronic tension type (bifrontal) Quality:  Severe crushing Initial Intensity:  10/10 Aura:  no Prodrome:  no Associated symptoms:  Photophobia, phonophobia.  No nausea, vomiting or visual disturbance Initial Duration:  Migraines 1 hour with sumatriptan, other headaches 15 minutes with Fioricet Initial Frequency:  Daily (3 to 4 days a month are severe migraine) Triggers/exacerbating factors:  Stress, Nexium Relieving factors:  Clonazepam, Fioricet, sumatriptan Activity:  Cannot function when severe (3-4 days per month)   Past NSAIDS:  Ibuprofen, naproxen Past analgesics:  Cafergot, Excedrin, Tylenol, Tramadol, Fioricet Past abortive triptans:  no Past muscle relaxants:  tizanidine, Flexeril Past anti-emetic:  Zofran ODT '8mg'$  Past antihypertensive medications:  Lopressor, propranolol Past antidepressant medications:  Elavil, Effexor, Zoloft Past anticonvulsant medications:  Topiramate, gabapentin '300mg'$  twice daily Past CGRP-inhibitor:  Aimovig '140mg'$  Past vitamins/Herbal/Supplements:  CoQ10 Other past therapies:  yoga   Family history of headache:  Father, sister, brother   MRI and  MRA of brain from 07/23/11 were personally reviewed and unremarkable.  PAST MEDICAL HISTORY: Past Medical History:  Diagnosis Date   Adjustment disorder with depressed mood 03/18/2007   Allergy    Anal fissure 10/08/2016   Angina    takes Propanolol prn and it stops her chest   Anorexia nervosa 11/11/2019   Barrett esophagus    not consistently present   Cataract    removed  but had retinal detachment - repeat cataract right eye   Cervical facet joint syndrome 10/08/2016   Chronic constipation 10/04/2010   Chronic interstitial cystitis 10/16/2011   Chronic kidney disease    kidney stones and UTI   Chronic maxillary sinusitis 09/15/2013   Chronic narcotic dependence [id number]   Complication of anesthesia    either  BP or pulse dropped with last surgery   Degeneration of cervical intervertebral disc 03/18/2007   Degeneration of lumbar or lumbosacral intervertebral disc 03/18/2007   Diaphragmatic hernia 03/18/2007   Dysrhythmia    brought on by stress   Epigastric pain 11/11/2019   Esophageal dysphagia 10/03/2018   External hemorrhoids    Fibromyalgia    neuropathy knees ankles and toes, bladder   Generalized anxiety disorder 03/18/2007   GERD (gastroesophageal reflux disease) 03/18/2007   HA (headache)-chronic/tension type 07/28/2012   Heart murmur    Hiatal hernia    History of anemia    History of gastric bypass 02/11/2014   [facility]   History of hyperparathyroidism 01/22/2016  History of kidney stones    Hypertension    Hypoglycemia 11/08/2016   IBS (irritable bowel syndrome)    Internal hemorrhoids    LLQ abdominal pain 11/11/2019   Macular degeneration    Malnutrition 11/11/2019   Mechanical breakdown implanted electronic neurostimulator spinal cord 11/16/2018   Mitral regurgitation 12/11/2015   MVP (mitral valve prolapse) 12/17/2017   Myalgia 03/18/2007   Nephrolithiasis 07/28/2012   lithotrip 2013-[place],MD   Neuropathic pain 01/23/2018    Orthostatic hypotension 11/20/2016   Osteoarthritis    all over   Osteopetrosis    Osteoporosis 07/28/2012   Peripheral vascular disease    superficial phlebitis in left calf   Personal history of colonic polyps 07/22/2013   07/2013 - 3 small adenomas - repeat colonoscopy 2018   PTSD (post-traumatic stress disorder)    Rosacea 11/02/2017   Small intestinal bacterial overgrowth 08/05/2017   + lactulose breath test 06/2017 Xifaxan Rx   Type II diabetes mellitus 11/18/2016    MEDICATIONS: Current Outpatient Medications on File Prior to Visit  Medication Sig Dispense Refill   aspirin-acetaminophen-caffeine (EXCEDRIN MIGRAINE) 250-250-65 MG tablet Take 2 tablets by mouth 2 (two) times daily.     Biotin 1000 MCG CHEW Chew 1 tablet by mouth daily.     butalbital-acetaminophen-caffeine (FIORICET) 50-325-40 MG tablet Take 1 tablet by mouth every 6 (six) hours as needed for headache. 10 tablet 2   CALCIUM PO Take by mouth daily.     celecoxib (CELEBREX) 200 MG capsule Take 200 mg by mouth 2 (two) times daily.     clonazePAM (KLONOPIN) 0.5 MG tablet Take 0.25 mg by mouth as needed.     COLLAGEN PO Take by mouth daily.     Continuous Blood Gluc Receiver ([place]) DEVI by Does not apply route.     Copper Gluconate POWD Take by mouth.     cyanocobalamin (,VITAMIN B-12,) 1000 MCG/ML injection SMARTSIG:1 Vial(s) IM Once a Month (Patient not taking: Reported on 03/12/2022)     cyclobenzaprine (FLEXERIL) 10 MG tablet Take by mouth.     cycloSPORINE (RESTASIS) 0.05 % ophthalmic emulsion Place 1 drop into both eyes 2 (two) times daily. 5.5 mL 5   diclofenac sodium (VOLTAREN) 1 % GEL Apply 2 g topically 4 (four) times daily. 700 g 3   dicyclomine (BENTYL) 10 MG capsule Take 1-2 tab 3 times daily AC as needed for spasms and cramping. 270 capsule 1   Docusate Sodium (DSS) 100 MG CAPS Take by mouth.     Erenumab-aooe 70 MG/ML SOAJ Inject into the skin.     famotidine (PEPCID) 20 MG tablet Take by  mouth.     fentaNYL (DURAGESIC) 25 MCG/HR Place 1 patch onto the skin every 3 (three) days. 10 patch 0   furosemide (LASIX) 40 MG tablet Take by mouth.     Galcanezumab-gnlm (EMGALITY) 120 MG/ML SOAJ Inject 120 mg into the skin every 28 (twenty-eight) days. 3 pen 3   glucagon 1 MG injection Inject 1 mg into the muscle once as needed for up to 1 dose. 1 each 12   HYDROcodone-acetaminophen (NORCO) 10-325 MG tablet Take 1/2 to 1 tablet four (4) times daily, if needed for pain, max daily dose: 4 tablets 60 tablet 0   hydrocortisone cream 1 % Apply topically.     hypromellose (GENTEAL) 0.3 % GEL ophthalmic ointment Place 1 drop into both eyes at bedtime. Reported on 02/11/2015     latanoprost (XALATAN) 0.005 % ophthalmic solution Apply to  eye.     lidocaine (LIDODERM) 5 % Place 1 patch onto the skin as needed. Remove & Discard patch within 12 hours. May dispense as 3 month supply 90 patch 3   Lifitegrast 5 % SOLN Place 1 drop into both eyes daily.     magnesium hydroxide (MILK OF MAGNESIA) 400 MG/5ML suspension Take by mouth.     metroNIDAZOLE (METROGEL) 1 % gel Apply topically daily. 45 g 0   mirabegron ER (MYRBETRIQ) 25 MG TB24 tablet Take 1 tablet (25 mg total) by mouth daily. 90 tablet 1   Multiple Vitamins-Minerals (PRESERVISION AREDS) CAPS Take 1 capsule by mouth 2 (two) times daily.      naloxegol oxalate (MOVANTIK) 25 MG TABS tablet Take 25 mg by mouth daily.     Naloxone HCl 0.4 MG/0.4ML SOAJ Administer 0.'4mg'$  [place] at first sign of opioid overdose and repeat every 2 minutes as needed for resuscitation. Call 911 immediately 5 Package [place]  Peripheral Neuropathy Cream- Bupivacaine 1%, Doxepin 3%, Gabapentin 6%, Pentoxifylline 3%, Topiramate 1% Apply 1-2 grams to affected area 3-4 times daily Qty. 120 gm 3 refills     NON FORMULARY GABA6%LIDO5%DICL3%     nystatin-triamcinolone (MYCOLOG II) cream      ondansetron (ZOFRAN) 8 MG tablet Take 1 tablet (8 mg total) by  mouth every 8 (eight) hours as needed for nausea or vomiting. 60 tablet 0   pantoprazole (PROTONIX) 40 MG tablet Take by mouth.     phenazopyridine (PYRIDIUM) 95 MG tablet Take by mouth. (Patient not taking: Reported on 03/12/2022)     polyethylene glycol (MIRALAX / GLYCOLAX) 17 g packet Take by mouth.     potassium chloride SA (KLOR-CON M) 20 MEQ tablet Take by mouth.     pregabalin (LYRICA) 50 MG capsule Take 1 capsule in morning and 2 capsules at night 90 capsule 5   Probiotic Product (PROBIOTIC-10 PO) Take by mouth.      senna-docusate (SENOKOT-S) 8.6-50 MG tablet Take by mouth.     sucralfate (CARAFATE) 1 GM/10ML suspension Take 10 mLs (1 g total) by mouth 4 (four) times daily. 420 mL 1   sulfamethoxazole-trimethoprim (BACTRIM DS) 800-160 MG tablet Take 1 tablet by mouth 2 (two) times daily.     SUMAtriptan (IMITREX) 100 MG tablet Take 1 tablet at earliest onset of headache, may repeat in 2 hours if headache persists or reoccurs. 30 tablet 1   traZODone (DESYREL) 50 MG tablet Take 50 mg by mouth at bedtime.     No current facility-administered medications on file prior to visit.    ALLERGIES: Allergies  Allergen Reactions   Ambien [Zolpidem Tartrate]     Hallucinations   Codeine Anaphylaxis   Oxycodone-Acetaminophen Shortness Of Breath   Tylox [Oxycodone-Acetaminophen] Anaphylaxis    Chest pain   Zolpidem Tartrate Anaphylaxis and Other (See Comments)    Other Reaction(s): Hallucination  Other reaction(s): Other, hallucinations, Other reaction(s): Other, hallucinations, hallucinations   Darvocet [Propoxyphene N-Acetaminophen]     Chest pain   Darvon [Propoxyphene] Itching   Lorcet [Hydrocodone-Acetaminophen]     Chest pain   Opium     hallucinations   Vicodin [Hydrocodone-Acetaminophen] Other (See Comments)    Chest Pain    Wheat Bran    Amoxicillin Nausea And Vomiting    Reaction:  Migraine headache   Penicillins Nausea And Vomiting    Migraine Headaches    FAMILY  HISTORY: Family History  Problem Relation Age of Onset  Stroke Mother    Lung cancer Mother    Heart disease Mother    Diabetes Mother    Hypertension Father    Stroke Father    Heart disease Father    Kidney disease Father    Seizures Father        epilepsy, and sisters x 2   Memory loss Father    Lung cancer Sister    Breast cancer Sister    Cancer - Lung Sister    Heart disease Sister    Diabetes Sister    Pancreatic cancer Sister    Healthy Daughter    Healthy Son    Colon cancer Maternal Aunt        12 relatives   Colon cancer Paternal Aunt    Colon cancer Paternal Aunt    Uterine cancer Other        aunt   Heart disease Other        grandmother   Esophageal cancer Neg Hx    Rectal cancer Neg Hx    Stomach cancer Neg Hx    Colon polyps Neg Hx       Objective:  *** General: No acute distress.  Patient appears well-groomed.   Head:  Normocephalic/atraumatic Eyes:  Fundi examined but not visualized Neck: supple, no paraspinal tenderness, full range of motion Heart:  Regular rate and rhythm Neurological Exam: alert and oriented to person, place, and time.  Speech fluent and not dysarthric, language intact.  CN II-XII intact. Bulk and tone normal, muscle strength 5/5 throughout.  Pinprick sensation reduced up to ankles, vibratory sensation reduced up to below knees  Deep tendon reflexes absent throughout, toes downgoing.  Finger to nose testing intact.  Cautious broad-based gait.  Romberg positive.   Metta Clines, DO  CC: ***

## 2022-03-19 ENCOUNTER — Ambulatory Visit (INDEPENDENT_AMBULATORY_CARE_PROVIDER_SITE_OTHER): Payer: Medicare Other | Admitting: Neurology

## 2022-03-19 ENCOUNTER — Other Ambulatory Visit: Payer: Self-pay | Admitting: *Deleted

## 2022-03-19 ENCOUNTER — Encounter: Payer: Self-pay | Admitting: Neurology

## 2022-03-19 VITALS — BP 124/72 | HR 91 | Ht 65.0 in | Wt 127.2 lb

## 2022-03-19 DIAGNOSIS — I2510 Atherosclerotic heart disease of native coronary artery without angina pectoris: Secondary | ICD-10-CM | POA: Diagnosis not present

## 2022-03-19 DIAGNOSIS — E559 Vitamin D deficiency, unspecified: Secondary | ICD-10-CM | POA: Diagnosis not present

## 2022-03-19 DIAGNOSIS — M810 Age-related osteoporosis without current pathological fracture: Secondary | ICD-10-CM

## 2022-03-19 DIAGNOSIS — G609 Hereditary and idiopathic neuropathy, unspecified: Secondary | ICD-10-CM | POA: Diagnosis not present

## 2022-03-19 DIAGNOSIS — G43009 Migraine without aura, not intractable, without status migrainosus: Secondary | ICD-10-CM | POA: Diagnosis not present

## 2022-03-19 DIAGNOSIS — G894 Chronic pain syndrome: Secondary | ICD-10-CM | POA: Diagnosis not present

## 2022-03-19 NOTE — Patient Instructions (Signed)
No change in medication

## 2022-03-20 ENCOUNTER — Encounter: Payer: Self-pay | Admitting: Rheumatology

## 2022-03-20 ENCOUNTER — Ambulatory Visit: Payer: Medicare Other | Attending: Rheumatology | Admitting: Rheumatology

## 2022-03-20 ENCOUNTER — Other Ambulatory Visit: Payer: Self-pay | Admitting: Pharmacist

## 2022-03-20 VITALS — BP 116/72 | HR 88 | Resp 16 | Ht 65.0 in | Wt 129.0 lb

## 2022-03-20 DIAGNOSIS — F32A Depression, unspecified: Secondary | ICD-10-CM

## 2022-03-20 DIAGNOSIS — M810 Age-related osteoporosis without current pathological fracture: Secondary | ICD-10-CM | POA: Diagnosis not present

## 2022-03-20 DIAGNOSIS — M7061 Trochanteric bursitis, right hip: Secondary | ICD-10-CM | POA: Diagnosis not present

## 2022-03-20 DIAGNOSIS — E162 Hypoglycemia, unspecified: Secondary | ICD-10-CM

## 2022-03-20 DIAGNOSIS — Z79899 Other long term (current) drug therapy: Secondary | ICD-10-CM | POA: Diagnosis not present

## 2022-03-20 DIAGNOSIS — R5383 Other fatigue: Secondary | ICD-10-CM

## 2022-03-20 DIAGNOSIS — M7062 Trochanteric bursitis, left hip: Secondary | ICD-10-CM | POA: Insufficient documentation

## 2022-03-20 DIAGNOSIS — M51369 Other intervertebral disc degeneration, lumbar region without mention of lumbar back pain or lower extremity pain: Secondary | ICD-10-CM

## 2022-03-20 DIAGNOSIS — E559 Vitamin D deficiency, unspecified: Secondary | ICD-10-CM

## 2022-03-20 DIAGNOSIS — M797 Fibromyalgia: Secondary | ICD-10-CM

## 2022-03-20 DIAGNOSIS — M5030 Other cervical disc degeneration, unspecified cervical region: Secondary | ICD-10-CM

## 2022-03-20 DIAGNOSIS — N3010 Interstitial cystitis (chronic) without hematuria: Secondary | ICD-10-CM

## 2022-03-20 DIAGNOSIS — G894 Chronic pain syndrome: Secondary | ICD-10-CM | POA: Diagnosis not present

## 2022-03-20 DIAGNOSIS — R252 Cramp and spasm: Secondary | ICD-10-CM | POA: Diagnosis not present

## 2022-03-20 DIAGNOSIS — M62838 Other muscle spasm: Secondary | ICD-10-CM

## 2022-03-20 DIAGNOSIS — F419 Anxiety disorder, unspecified: Secondary | ICD-10-CM | POA: Diagnosis not present

## 2022-03-20 DIAGNOSIS — Z8719 Personal history of other diseases of the digestive system: Secondary | ICD-10-CM | POA: Diagnosis not present

## 2022-03-20 DIAGNOSIS — M5136 Other intervertebral disc degeneration, lumbar region: Secondary | ICD-10-CM | POA: Diagnosis not present

## 2022-03-20 LAB — COMPREHENSIVE METABOLIC PANEL
AG Ratio: 1.8 (calc) (ref 1.0–2.5)
ALT: 28 U/L (ref 6–29)
AST: 29 U/L (ref 10–35)
Albumin: 3.5 g/dL — ABNORMAL LOW (ref 3.6–5.1)
Alkaline phosphatase (APISO): 72 U/L (ref 37–153)
BUN/Creatinine Ratio: 54 (calc) — ABNORMAL HIGH (ref 6–22)
BUN: 31 mg/dL — ABNORMAL HIGH (ref 7–25)
CO2: 33 mmol/L — ABNORMAL HIGH (ref 20–32)
Calcium: 9.2 mg/dL (ref 8.6–10.4)
Chloride: 103 mmol/L (ref 98–110)
Creat: 0.57 mg/dL — ABNORMAL LOW (ref 0.60–1.00)
Globulin: 1.9 g/dL (calc) (ref 1.9–3.7)
Glucose, Bld: 88 mg/dL (ref 65–99)
Potassium: 5.5 mmol/L — ABNORMAL HIGH (ref 3.5–5.3)
Sodium: 141 mmol/L (ref 135–146)
Total Bilirubin: 0.3 mg/dL (ref 0.2–1.2)
Total Protein: 5.4 g/dL — ABNORMAL LOW (ref 6.1–8.1)

## 2022-03-20 LAB — CBC WITH DIFFERENTIAL/PLATELET
Absolute Monocytes: 585 cells/uL (ref 200–950)
Basophils Absolute: 54 cells/uL (ref 0–200)
Basophils Relative: 0.6 %
Eosinophils Absolute: 351 cells/uL (ref 15–500)
Eosinophils Relative: 3.9 %
HCT: 42.1 % (ref 35.0–45.0)
Hemoglobin: 14.2 g/dL (ref 11.7–15.5)
Lymphs Abs: 2682 cells/uL (ref 850–3900)
MCH: 30.1 pg (ref 27.0–33.0)
MCHC: 33.7 g/dL (ref 32.0–36.0)
MCV: 89.2 fL (ref 80.0–100.0)
MPV: 9.6 fL (ref 7.5–12.5)
Monocytes Relative: 6.5 %
Neutro Abs: 5328 cells/uL (ref 1500–7800)
Neutrophils Relative %: 59.2 %
Platelets: 271 10*3/uL (ref 140–400)
RBC: 4.72 10*6/uL (ref 3.80–5.10)
RDW: 13 % (ref 11.0–15.0)
Total Lymphocyte: 29.8 %
WBC: 9 10*3/uL (ref 3.8–10.8)

## 2022-03-20 LAB — VITAMIN D 25 HYDROXY (VIT D DEFICIENCY, FRACTURES): Vit D, 25-Hydroxy: 28 ng/mL — ABNORMAL LOW (ref 30–100)

## 2022-03-20 MED ORDER — VITAMIN D (ERGOCALCIFEROL) 1.25 MG (50000 UNIT) PO CAPS: 50000.0000 [IU] | ORAL_CAPSULE | ORAL | 0 refills | Status: DC

## 2022-03-20 NOTE — Progress Notes (Deleted)
Office Visit Note  Patient: Kayla [name]             Date of Birth: 09/08/1945           MRN: [id number]             PCP: [facility] Visit Date: 03/20/2022   Assessment:        Visit Diagnoses:  1. Age-related osteoporosis without current pathological fracture   2. Vitamin D deficiency   3. Medication management   4. DDD (degenerative disc disease), cervical   5. Trapezius muscle spasm   6. DDD (degenerative disc disease), lumbar   7. Trochanteric bursitis of both hips   8. Fibromyalgia   9. Chronic pain syndrome   10. Muscle cramps   11. Other fatigue   12. Anxiety and depression   13. IC (interstitial cystitis)   14. Hypoglycemia   15. History of gastroesophageal reflux (GERD)      Follow-Up Instructions: Return in about 6 months (around 09/20/2022) for Osteoporosis, Osteoarthritis.  Orders: No orders of the defined types were placed in this encounter.  No orders of the defined types were placed in this encounter.    Subjective:    Allergies: Ambien [zolpidem tartrate], Codeine, Oxycodone-acetaminophen, Tylox [oxycodone-acetaminophen], Zolpidem tartrate, Darvocet [propoxyphene n-acetaminophen], Darvon [propoxyphene], Lorcet [hydrocodone-acetaminophen], Opium, Vicodin [hydrocodone-acetaminophen], Wheat bran, Amoxicillin, and Penicillins   Activities of Daily Living: ***   History of Present Illness: Kayla [name] is a 77 y.o. female ***   Review of Systems  Constitutional:  Positive for fatigue.  HENT:  Positive for mouth dryness. Negative for mouth sores.   Eyes:  Positive for dryness.  Respiratory: Negative.  Negative for shortness of breath.   Cardiovascular:  Positive for chest pain. Negative for palpitations.  Gastrointestinal:  Positive for constipation. Negative for blood in stool and diarrhea.  Endocrine: Positive for increased urination.  Genitourinary:  Positive for involuntary urination.  Musculoskeletal:  Positive for joint  pain, gait problem, joint pain, myalgias, muscle weakness, morning stiffness, muscle tenderness and myalgias. Negative for joint swelling.  Skin: Negative.  Negative for color change, rash, hair loss and sensitivity to sunlight.  Allergic/Immunologic: Negative.  Negative for susceptible to infections.  Neurological:  Positive for dizziness and headaches.  Hematological:  Positive for swollen glands.  Psychiatric/Behavioral:  Positive for sleep disturbance. Negative for depressed mood. The patient is nervous/anxious.      Investigation: No additional findings.   Objective: Vital Signs: There were no vitals taken for this visit.   Physical Exam   Musculoskeletal Exam: ***  CDAI Exam: CDAI Score: -- Patient Global: --; Provider Global: -- Swollen: --; Tender: -- Joint Exam 03/20/2022   No joint exam has been documented for this visit   There is currently no information documented on the homunculus. Go to the Rheumatology activity and complete the homunculus joint exam.  Speciality Comments: Fosamax 2013-2018 Patient declined Tymlos and Forteo-travels frequently. Prolia May 26, 2020, January 26, 2021  Imaging: No results found.   PMFS History:  Patient Active Problem List   Diagnosis Date Noted   Iron deficiency anemia due to dietary causes 04/18/2021   Peripheral neuropathy 04/03/2021   Chronic kidney disease 03/22/2021   Macular degeneration 03/22/2021   PTSD (post-traumatic stress disorder)    Epigastric pain 11/11/2019   Anorexia nervosa 11/11/2019   LLQ abdominal pain 11/11/2019   Malnutrition 11/11/2019   Mechanical breakdown implanted electronic neurostimulator spinal cord 11/16/2018   Esophageal dysphagia 10/03/2018  Neuropathic pain 01/23/2018   MVP (mitral valve prolapse) 12/17/2017   Rosacea 11/02/2017   Small intestinal bacterial overgrowth 08/05/2017   Orthostatic hypotension 11/20/2016   Type II diabetes mellitus 11/18/2016   Hypoglycemia  11/08/2016   Cervical facet joint syndrome 10/08/2016   Anal fissure 10/08/2016   Peripheral vascular disease 01/22/2016   Hypertension 01/22/2016   History of hyperparathyroidism 01/22/2016   Mitral regurgitation 12/11/2015   History of gastric bypass 02/11/2014   Chronic maxillary sinusitis 09/15/2013   Personal history of colonic polyps 07/22/2013   HA (headache)-chronic/tension type 07/28/2012   Osteoporosis 07/28/2012   Nephrolithiasis 07/28/2012   Fibromyalgia 07/26/2012   Chronic interstitial cystitis 10/16/2011   Chronic constipation 10/04/2010   Loss of weight 11/27/2009   Generalized anxiety disorder 03/18/2007   Adjustment disorder with depressed mood 03/18/2007   GERD (gastroesophageal reflux disease) 03/18/2007   Diaphragmatic hernia 03/18/2007   Osteoarthritis 03/18/2007   Degeneration of cervical intervertebral disc 03/18/2007   Degeneration of lumbar or lumbosacral intervertebral disc 03/18/2007   Myalgia 03/18/2007    Past Medical History:  Diagnosis Date   Adjustment disorder with depressed mood 03/18/2007   Allergy    Anal fissure 10/08/2016   Angina    takes Propanolol prn and it stops her chest   Anorexia nervosa 11/11/2019   Barrett esophagus    not consistently present   Cataract    removed  but had retinal detachment - repeat cataract right eye   Cervical facet joint syndrome 10/08/2016   Chronic constipation 10/04/2010   Chronic interstitial cystitis 10/16/2011   Chronic kidney disease    kidney stones and UTI   Chronic maxillary sinusitis 09/15/2013   Chronic narcotic dependence [id number]   Complication of anesthesia    either  BP or pulse dropped with last surgery   Degeneration of cervical intervertebral disc 03/18/2007   Degeneration of lumbar or lumbosacral intervertebral disc 03/18/2007   Diaphragmatic hernia 03/18/2007   Dysrhythmia    brought on by stress   Epigastric pain 11/11/2019   Esophageal dysphagia 10/03/2018   External  hemorrhoids    Fibromyalgia    neuropathy knees ankles and toes, bladder   Generalized anxiety disorder 03/18/2007   GERD (gastroesophageal reflux disease) 03/18/2007   HA (headache)-chronic/tension type 07/28/2012   Heart murmur    Hiatal hernia    History of anemia    History of gastric bypass 02/11/2014   1999 [facility]   History of hyperparathyroidism 01/22/2016   History of kidney stones    Hypertension    Hypoglycemia 11/08/2016   IBS (irritable bowel syndrome)    Internal hemorrhoids    LLQ abdominal pain 11/11/2019   Macular degeneration    Malnutrition 11/11/2019   Mechanical breakdown implanted electronic neurostimulator spinal cord 11/16/2018   Mitral regurgitation 12/11/2015   MVP (mitral valve prolapse) 12/17/2017   Myalgia 03/18/2007   Nephrolithiasis 07/28/2012   lithotrip 2013-[place]ridge,MD   Neuropathic pain 01/23/2018   Orthostatic hypotension 11/20/2016   Osteoarthritis    all over   Osteopetrosis    Osteoporosis 07/28/2012   Peripheral vascular disease    superficial phlebitis in left calf   Personal history of colonic polyps 07/22/2013   07/2013 - 3 small adenomas - repeat colonoscopy 2018   PTSD (post-traumatic stress disorder)    Rosacea 11/02/2017   Small intestinal bacterial overgrowth 08/05/2017   + lactulose breath test 06/2017 Xifaxan Rx   Type II diabetes mellitus 11/18/2016    Family History  Problem  Relation Age of Onset   Stroke Mother    Lung cancer Mother    Heart disease Mother    Diabetes Mother    Hypertension Father    Stroke Father    Heart disease Father    Kidney disease Father    Seizures Father        epilepsy, and sisters x 2   Memory loss Father    Lung cancer Sister    Breast cancer Sister    Cancer - Lung Sister    Heart disease Sister    Diabetes Sister    Pancreatic cancer Sister    Healthy Daughter    Healthy Son    Colon cancer Maternal Aunt        12 relatives   Colon cancer Paternal Aunt    Colon  cancer Paternal Aunt    Uterine cancer Other        aunt   Heart disease Other        grandmother   Esophageal cancer Neg Hx    Rectal cancer Neg Hx    Stomach cancer Neg Hx    Colon polyps Neg Hx    Past Surgical History:  Procedure Laterality Date   ABDOMINAL HYSTERECTOMY     APPENDECTOMY     CARDIAC CATHETERIZATION  03/22/1991   EF 61%   CARDIOVASCULAR STRESS TEST  10/03/2006   CERVICAL SPINE SURGERY  02/14/2022   BLOCK   CHOLECYSTECTOMY     COLONOSCOPY  06/23/2008   normal   DILATION AND CURETTAGE OF UTERUS     x2   ESOPHAGUS SURGERY     EXPLORATORY LAPAROTOMY  1993   EYE SURGERY     GASTRIC BYPASS  1999   GASTRIC RESTRICTION SURGERY  1991   LASIK Bilateral    Right Arm Surgery     Right Knee Arthroscopy     Rt wrist fx  2009   spinal surgery battery implant     stomach stappeling  1991   STONE EXTRACTION WITH BASKET  2012   TONSILLECTOMY     TUBAL LIGATION     UPPER GASTROINTESTINAL ENDOSCOPY  06/23/2008   w/Dil, Barrett's esophagus   US ECHOCARDIOGRAPHY  01/21/2007   EF 55-60%   US ECHOCARDIOGRAPHY  08/30/2003   EF 55-60%   Social History   Social History Narrative   Right handed   Two story home   She retired on disability   Married   2 Children     Procedures:  No procedures performed  Stephan Minister, CMA  Note - This record has been created using Editor, commissioning. Chart creation errors have been sought, but may not always have been located. Such creation errors do not reflect on the standard of medical care.

## 2022-03-20 NOTE — Progress Notes (Signed)
Next Prolia SQ due on 01/30/2022 (pt overdue). Diagnosis: age-related osteoporosis  Dose: 60 mg SQ every 6 months  Last Clinic Visit: 09/05/21 Next Clinic Visit: 03/20/22 (scheduled)  Last Prolia dose: 08/03/2021  Labs: CBC, CMP, Vitamin D on 03/18/21 - Vitamin D slightly low but >25 so can proceed w Prolia. Calcium normal Last DEXA: 01/11/20 Next DEXA due: Dec 2023 (ovedue)  Orders placed for Prolia x 1 dose. No premedicatons required.   MyChart message sent to patient and provided with phone number for: [facility] [phone]) Lady Gary  Will follow-up to ensured scheduled and completed   Knox Saliva, PharmD, MPH, BCPS, CPP Clinical Pharmacist (Rheumatology and Pulmonology)

## 2022-03-20 NOTE — Telephone Encounter (Signed)
Labs from 03/19/2022 - calcium normal, Vitamin D low (28) but cna proceed w Prolia since >25.  Order placed for Prolia at [facility] today.  Knox Saliva, PharmD, MPH, BCPS, CPP Clinical Pharmacist (Rheumatology and Pulmonology)

## 2022-03-20 NOTE — Patient Instructions (Addendum)
You are OVERDUE for your Prolia. We placed an order today. Please call [facility] at [phone] to schedule your Prolia appt as soon as possible. If you reach their voicemail, leave a message with your name, birthday, and name of medication (PROLIA) and they'll return your call to schedule.   Please start Vitamin D2 - 1 capsule once weekly for 3 months.  Continue calcium '600mg'$  twice daily for your osteoporosis.

## 2022-03-20 NOTE — Progress Notes (Signed)
Labs were discussed with the patient at the office visit today.  Please forward results to her PCP.

## 2022-03-21 DIAGNOSIS — Z48815 Encounter for surgical aftercare following surgery on the digestive system: Secondary | ICD-10-CM | POA: Diagnosis not present

## 2022-03-21 DIAGNOSIS — K912 Postsurgical malabsorption, not elsewhere classified: Secondary | ICD-10-CM | POA: Diagnosis not present

## 2022-03-21 DIAGNOSIS — Z9884 Bariatric surgery status: Secondary | ICD-10-CM | POA: Diagnosis not present

## 2022-03-21 DIAGNOSIS — Z79899 Other long term (current) drug therapy: Secondary | ICD-10-CM | POA: Diagnosis not present

## 2022-03-21 DIAGNOSIS — Z8639 Personal history of other endocrine, nutritional and metabolic disease: Secondary | ICD-10-CM | POA: Diagnosis not present

## 2022-03-21 DIAGNOSIS — R55 Syncope and collapse: Secondary | ICD-10-CM | POA: Diagnosis not present

## 2022-03-21 DIAGNOSIS — E43 Unspecified severe protein-calorie malnutrition: Secondary | ICD-10-CM | POA: Diagnosis not present

## 2022-03-21 DIAGNOSIS — E161 Other hypoglycemia: Secondary | ICD-10-CM | POA: Diagnosis not present

## 2022-03-21 DIAGNOSIS — R1319 Other dysphagia: Secondary | ICD-10-CM | POA: Diagnosis not present

## 2022-03-22 NOTE — Progress Notes (Signed)
Prolia scheduled for 03/29/22. Will f/u to ensure completed  Knox Saliva, PharmD, MPH, BCPS, CPP Clinical Pharmacist (Rheumatology and Pulmonology)

## 2022-03-26 DIAGNOSIS — Z452 Encounter for adjustment and management of vascular access device: Secondary | ICD-10-CM | POA: Diagnosis not present

## 2022-03-26 DIAGNOSIS — K912 Postsurgical malabsorption, not elsewhere classified: Secondary | ICD-10-CM | POA: Diagnosis not present

## 2022-03-26 DIAGNOSIS — E43 Unspecified severe protein-calorie malnutrition: Secondary | ICD-10-CM | POA: Diagnosis not present

## 2022-03-26 DIAGNOSIS — E1142 Type 2 diabetes mellitus with diabetic polyneuropathy: Secondary | ICD-10-CM | POA: Diagnosis not present

## 2022-03-26 DIAGNOSIS — R1310 Dysphagia, unspecified: Secondary | ICD-10-CM | POA: Diagnosis not present

## 2022-03-26 DIAGNOSIS — K2270 Barrett's esophagus without dysplasia: Secondary | ICD-10-CM | POA: Diagnosis not present

## 2022-03-28 ENCOUNTER — Other Ambulatory Visit (HOSPITAL_BASED_OUTPATIENT_CLINIC_OR_DEPARTMENT_OTHER): Payer: Self-pay

## 2022-03-28 DIAGNOSIS — F32A Depression, unspecified: Secondary | ICD-10-CM | POA: Diagnosis not present

## 2022-03-28 DIAGNOSIS — G609 Hereditary and idiopathic neuropathy, unspecified: Secondary | ICD-10-CM | POA: Diagnosis not present

## 2022-03-28 DIAGNOSIS — M542 Cervicalgia: Secondary | ICD-10-CM | POA: Diagnosis not present

## 2022-03-28 DIAGNOSIS — G894 Chronic pain syndrome: Secondary | ICD-10-CM | POA: Diagnosis not present

## 2022-03-28 DIAGNOSIS — Z79899 Other long term (current) drug therapy: Secondary | ICD-10-CM | POA: Diagnosis not present

## 2022-03-28 DIAGNOSIS — G8929 Other chronic pain: Secondary | ICD-10-CM | POA: Diagnosis not present

## 2022-03-28 DIAGNOSIS — M545 Low back pain, unspecified: Secondary | ICD-10-CM | POA: Diagnosis not present

## 2022-03-28 DIAGNOSIS — F419 Anxiety disorder, unspecified: Secondary | ICD-10-CM | POA: Diagnosis not present

## 2022-03-28 MED ORDER — FENTANYL 25 MCG/HR TD PT72
1.0000 | MEDICATED_PATCH | TRANSDERMAL | 0 refills | Status: DC
  Filled 2022-03-28 – 2022-04-08 (×2): qty 10, 30d supply, fill #0

## 2022-03-28 MED ORDER — HYDROCODONE-ACETAMINOPHEN 10-325 MG PO TABS
0.5000 | ORAL_TABLET | Freq: Four times a day (QID) | ORAL | 0 refills | Status: DC | PRN
  Filled 2022-03-29: qty 120, 30d supply, fill #0

## 2022-03-29 ENCOUNTER — Ambulatory Visit (HOSPITAL_COMMUNITY)
Admission: RE | Admit: 2022-03-29 | Discharge: 2022-03-29 | Disposition: A | Discharge status: Home / Self Care | Payer: Medicare Other | Source: Ambulatory Visit | Attending: Rheumatology | Admitting: Rheumatology

## 2022-03-29 ENCOUNTER — Other Ambulatory Visit (HOSPITAL_BASED_OUTPATIENT_CLINIC_OR_DEPARTMENT_OTHER): Payer: Self-pay

## 2022-03-29 DIAGNOSIS — I340 Nonrheumatic mitral (valve) insufficiency: Secondary | ICD-10-CM | POA: Diagnosis not present

## 2022-03-29 DIAGNOSIS — E43 Unspecified severe protein-calorie malnutrition: Secondary | ICD-10-CM | POA: Diagnosis not present

## 2022-03-29 DIAGNOSIS — F411 Generalized anxiety disorder: Secondary | ICD-10-CM | POA: Diagnosis not present

## 2022-03-29 DIAGNOSIS — D631 Anemia in chronic kidney disease: Secondary | ICD-10-CM | POA: Diagnosis not present

## 2022-03-29 DIAGNOSIS — Z452 Encounter for adjustment and management of vascular access device: Secondary | ICD-10-CM | POA: Diagnosis not present

## 2022-03-29 DIAGNOSIS — Z8711 Personal history of peptic ulcer disease: Secondary | ICD-10-CM | POA: Diagnosis not present

## 2022-03-29 DIAGNOSIS — M797 Fibromyalgia: Secondary | ICD-10-CM | POA: Diagnosis not present

## 2022-03-29 DIAGNOSIS — Z791 Long term (current) use of non-steroidal anti-inflammatories (NSAID): Secondary | ICD-10-CM | POA: Diagnosis not present

## 2022-03-29 DIAGNOSIS — H3530 Unspecified macular degeneration: Secondary | ICD-10-CM | POA: Diagnosis not present

## 2022-03-29 DIAGNOSIS — K2270 Barrett's esophagus without dysplasia: Secondary | ICD-10-CM | POA: Diagnosis not present

## 2022-03-29 DIAGNOSIS — F339 Major depressive disorder, recurrent, unspecified: Secondary | ICD-10-CM | POA: Diagnosis not present

## 2022-03-29 DIAGNOSIS — Z9181 History of falling: Secondary | ICD-10-CM | POA: Diagnosis not present

## 2022-03-29 DIAGNOSIS — I341 Nonrheumatic mitral (valve) prolapse: Secondary | ICD-10-CM | POA: Diagnosis not present

## 2022-03-29 DIAGNOSIS — G8929 Other chronic pain: Secondary | ICD-10-CM | POA: Diagnosis not present

## 2022-03-29 DIAGNOSIS — D509 Iron deficiency anemia, unspecified: Secondary | ICD-10-CM | POA: Diagnosis not present

## 2022-03-29 DIAGNOSIS — M810 Age-related osteoporosis without current pathological fracture: Secondary | ICD-10-CM | POA: Insufficient documentation

## 2022-03-29 DIAGNOSIS — G43909 Migraine, unspecified, not intractable, without status migrainosus: Secondary | ICD-10-CM | POA: Diagnosis not present

## 2022-03-29 DIAGNOSIS — N189 Chronic kidney disease, unspecified: Secondary | ICD-10-CM | POA: Diagnosis not present

## 2022-03-29 DIAGNOSIS — K912 Postsurgical malabsorption, not elsewhere classified: Secondary | ICD-10-CM | POA: Diagnosis not present

## 2022-03-29 DIAGNOSIS — E1142 Type 2 diabetes mellitus with diabetic polyneuropathy: Secondary | ICD-10-CM | POA: Diagnosis not present

## 2022-03-29 DIAGNOSIS — E1122 Type 2 diabetes mellitus with diabetic chronic kidney disease: Secondary | ICD-10-CM | POA: Diagnosis not present

## 2022-03-29 DIAGNOSIS — E559 Vitamin D deficiency, unspecified: Secondary | ICD-10-CM | POA: Diagnosis not present

## 2022-03-29 DIAGNOSIS — N200 Calculus of kidney: Secondary | ICD-10-CM | POA: Diagnosis not present

## 2022-03-29 DIAGNOSIS — R1310 Dysphagia, unspecified: Secondary | ICD-10-CM | POA: Diagnosis not present

## 2022-03-29 MED ORDER — DENOSUMAB 60 MG/ML ~~LOC~~ SOSY
PREFILLED_SYRINGE | SUBCUTANEOUS | Status: AC
  Filled 2022-03-29: qty 1

## 2022-03-29 MED ORDER — DENOSUMAB 60 MG/ML ~~LOC~~ SOSY
60.0000 mg | PREFILLED_SYRINGE | Freq: Once | SUBCUTANEOUS | Status: AC
  Administered 2022-03-29: 60 mg via SUBCUTANEOUS

## 2022-04-01 DIAGNOSIS — K2270 Barrett's esophagus without dysplasia: Secondary | ICD-10-CM | POA: Diagnosis not present

## 2022-04-01 DIAGNOSIS — Z79899 Other long term (current) drug therapy: Secondary | ICD-10-CM | POA: Diagnosis not present

## 2022-04-01 DIAGNOSIS — E43 Unspecified severe protein-calorie malnutrition: Secondary | ICD-10-CM | POA: Diagnosis not present

## 2022-04-01 DIAGNOSIS — K912 Postsurgical malabsorption, not elsewhere classified: Secondary | ICD-10-CM | POA: Diagnosis not present

## 2022-04-01 DIAGNOSIS — Z452 Encounter for adjustment and management of vascular access device: Secondary | ICD-10-CM | POA: Diagnosis not present

## 2022-04-01 DIAGNOSIS — E1142 Type 2 diabetes mellitus with diabetic polyneuropathy: Secondary | ICD-10-CM | POA: Diagnosis not present

## 2022-04-01 DIAGNOSIS — R1310 Dysphagia, unspecified: Secondary | ICD-10-CM | POA: Diagnosis not present

## 2022-04-08 ENCOUNTER — Other Ambulatory Visit (HOSPITAL_BASED_OUTPATIENT_CLINIC_OR_DEPARTMENT_OTHER): Payer: Self-pay

## 2022-04-08 DIAGNOSIS — E43 Unspecified severe protein-calorie malnutrition: Secondary | ICD-10-CM | POA: Diagnosis not present

## 2022-04-08 DIAGNOSIS — K912 Postsurgical malabsorption, not elsewhere classified: Secondary | ICD-10-CM | POA: Diagnosis not present

## 2022-04-08 DIAGNOSIS — E1142 Type 2 diabetes mellitus with diabetic polyneuropathy: Secondary | ICD-10-CM | POA: Diagnosis not present

## 2022-04-08 DIAGNOSIS — Z452 Encounter for adjustment and management of vascular access device: Secondary | ICD-10-CM | POA: Diagnosis not present

## 2022-04-08 DIAGNOSIS — R1310 Dysphagia, unspecified: Secondary | ICD-10-CM | POA: Diagnosis not present

## 2022-04-08 DIAGNOSIS — K2270 Barrett's esophagus without dysplasia: Secondary | ICD-10-CM | POA: Diagnosis not present

## 2022-04-09 ENCOUNTER — Ambulatory Visit (HOSPITAL_COMMUNITY): Payer: Medicare Other | Attending: Cardiology

## 2022-04-09 ENCOUNTER — Other Ambulatory Visit (HOSPITAL_COMMUNITY): Payer: Self-pay

## 2022-04-09 DIAGNOSIS — I341 Nonrheumatic mitral (valve) prolapse: Secondary | ICD-10-CM | POA: Insufficient documentation

## 2022-04-10 LAB — ECHOCARDIOGRAM COMPLETE
Area-P 1/2: 4.06 cm2
S' Lateral: 2.6 cm

## 2022-04-15 DIAGNOSIS — K912 Postsurgical malabsorption, not elsewhere classified: Secondary | ICD-10-CM | POA: Diagnosis not present

## 2022-04-15 DIAGNOSIS — E1142 Type 2 diabetes mellitus with diabetic polyneuropathy: Secondary | ICD-10-CM | POA: Diagnosis not present

## 2022-04-15 DIAGNOSIS — R1310 Dysphagia, unspecified: Secondary | ICD-10-CM | POA: Diagnosis not present

## 2022-04-15 DIAGNOSIS — E43 Unspecified severe protein-calorie malnutrition: Secondary | ICD-10-CM | POA: Diagnosis not present

## 2022-04-15 DIAGNOSIS — Z452 Encounter for adjustment and management of vascular access device: Secondary | ICD-10-CM | POA: Diagnosis not present

## 2022-04-15 DIAGNOSIS — K2270 Barrett's esophagus without dysplasia: Secondary | ICD-10-CM | POA: Diagnosis not present

## 2022-04-16 ENCOUNTER — Ambulatory Visit
Admission: RE | Admit: 2022-04-16 | Discharge: 2022-04-16 | Disposition: A | Discharge status: Home / Self Care | Payer: Medicare Other | Source: Ambulatory Visit | Attending: Adult Health | Admitting: Adult Health

## 2022-04-16 DIAGNOSIS — Z1231 Encounter for screening mammogram for malignant neoplasm of breast: Secondary | ICD-10-CM

## 2022-04-17 DIAGNOSIS — F419 Anxiety disorder, unspecified: Secondary | ICD-10-CM | POA: Diagnosis not present

## 2022-04-17 DIAGNOSIS — M47812 Spondylosis without myelopathy or radiculopathy, cervical region: Secondary | ICD-10-CM | POA: Diagnosis not present

## 2022-04-18 DIAGNOSIS — H40023 Open angle with borderline findings, high risk, bilateral: Secondary | ICD-10-CM | POA: Diagnosis not present

## 2022-04-23 DIAGNOSIS — K2270 Barrett's esophagus without dysplasia: Secondary | ICD-10-CM | POA: Diagnosis not present

## 2022-04-23 DIAGNOSIS — R1310 Dysphagia, unspecified: Secondary | ICD-10-CM | POA: Diagnosis not present

## 2022-04-23 DIAGNOSIS — Z452 Encounter for adjustment and management of vascular access device: Secondary | ICD-10-CM | POA: Diagnosis not present

## 2022-04-23 DIAGNOSIS — E43 Unspecified severe protein-calorie malnutrition: Secondary | ICD-10-CM | POA: Diagnosis not present

## 2022-04-23 DIAGNOSIS — K912 Postsurgical malabsorption, not elsewhere classified: Secondary | ICD-10-CM | POA: Diagnosis not present

## 2022-04-23 DIAGNOSIS — E1142 Type 2 diabetes mellitus with diabetic polyneuropathy: Secondary | ICD-10-CM | POA: Diagnosis not present

## 2022-04-28 DIAGNOSIS — G8929 Other chronic pain: Secondary | ICD-10-CM | POA: Diagnosis not present

## 2022-04-28 DIAGNOSIS — K912 Postsurgical malabsorption, not elsewhere classified: Secondary | ICD-10-CM | POA: Diagnosis not present

## 2022-04-28 DIAGNOSIS — G43909 Migraine, unspecified, not intractable, without status migrainosus: Secondary | ICD-10-CM | POA: Diagnosis not present

## 2022-04-28 DIAGNOSIS — D631 Anemia in chronic kidney disease: Secondary | ICD-10-CM | POA: Diagnosis not present

## 2022-04-28 DIAGNOSIS — Z791 Long term (current) use of non-steroidal anti-inflammatories (NSAID): Secondary | ICD-10-CM | POA: Diagnosis not present

## 2022-04-28 DIAGNOSIS — E1122 Type 2 diabetes mellitus with diabetic chronic kidney disease: Secondary | ICD-10-CM | POA: Diagnosis not present

## 2022-04-28 DIAGNOSIS — R1310 Dysphagia, unspecified: Secondary | ICD-10-CM | POA: Diagnosis not present

## 2022-04-28 DIAGNOSIS — N189 Chronic kidney disease, unspecified: Secondary | ICD-10-CM | POA: Diagnosis not present

## 2022-04-28 DIAGNOSIS — F411 Generalized anxiety disorder: Secondary | ICD-10-CM | POA: Diagnosis not present

## 2022-04-28 DIAGNOSIS — E43 Unspecified severe protein-calorie malnutrition: Secondary | ICD-10-CM | POA: Diagnosis not present

## 2022-04-28 DIAGNOSIS — Z8711 Personal history of peptic ulcer disease: Secondary | ICD-10-CM | POA: Diagnosis not present

## 2022-04-28 DIAGNOSIS — K2270 Barrett's esophagus without dysplasia: Secondary | ICD-10-CM | POA: Diagnosis not present

## 2022-04-28 DIAGNOSIS — I341 Nonrheumatic mitral (valve) prolapse: Secondary | ICD-10-CM | POA: Diagnosis not present

## 2022-04-28 DIAGNOSIS — Z452 Encounter for adjustment and management of vascular access device: Secondary | ICD-10-CM | POA: Diagnosis not present

## 2022-04-28 DIAGNOSIS — Z9181 History of falling: Secondary | ICD-10-CM | POA: Diagnosis not present

## 2022-04-28 DIAGNOSIS — D509 Iron deficiency anemia, unspecified: Secondary | ICD-10-CM | POA: Diagnosis not present

## 2022-04-28 DIAGNOSIS — H3530 Unspecified macular degeneration: Secondary | ICD-10-CM | POA: Diagnosis not present

## 2022-04-28 DIAGNOSIS — M797 Fibromyalgia: Secondary | ICD-10-CM | POA: Diagnosis not present

## 2022-04-28 DIAGNOSIS — E559 Vitamin D deficiency, unspecified: Secondary | ICD-10-CM | POA: Diagnosis not present

## 2022-04-28 DIAGNOSIS — E1142 Type 2 diabetes mellitus with diabetic polyneuropathy: Secondary | ICD-10-CM | POA: Diagnosis not present

## 2022-04-28 DIAGNOSIS — Z79891 Long term (current) use of opiate analgesic: Secondary | ICD-10-CM | POA: Diagnosis not present

## 2022-04-28 DIAGNOSIS — N200 Calculus of kidney: Secondary | ICD-10-CM | POA: Diagnosis not present

## 2022-04-28 DIAGNOSIS — I340 Nonrheumatic mitral (valve) insufficiency: Secondary | ICD-10-CM | POA: Diagnosis not present

## 2022-04-28 DIAGNOSIS — F339 Major depressive disorder, recurrent, unspecified: Secondary | ICD-10-CM | POA: Diagnosis not present

## 2022-04-28 DIAGNOSIS — M810 Age-related osteoporosis without current pathological fracture: Secondary | ICD-10-CM | POA: Diagnosis not present

## 2022-04-29 DIAGNOSIS — K2270 Barrett's esophagus without dysplasia: Secondary | ICD-10-CM | POA: Diagnosis not present

## 2022-04-29 DIAGNOSIS — Z452 Encounter for adjustment and management of vascular access device: Secondary | ICD-10-CM | POA: Diagnosis not present

## 2022-04-29 DIAGNOSIS — E43 Unspecified severe protein-calorie malnutrition: Secondary | ICD-10-CM | POA: Diagnosis not present

## 2022-04-29 DIAGNOSIS — K912 Postsurgical malabsorption, not elsewhere classified: Secondary | ICD-10-CM | POA: Diagnosis not present

## 2022-04-29 DIAGNOSIS — E1142 Type 2 diabetes mellitus with diabetic polyneuropathy: Secondary | ICD-10-CM | POA: Diagnosis not present

## 2022-04-29 DIAGNOSIS — R1310 Dysphagia, unspecified: Secondary | ICD-10-CM | POA: Diagnosis not present

## 2022-04-30 ENCOUNTER — Other Ambulatory Visit (HOSPITAL_BASED_OUTPATIENT_CLINIC_OR_DEPARTMENT_OTHER): Payer: Self-pay

## 2022-04-30 DIAGNOSIS — G609 Hereditary and idiopathic neuropathy, unspecified: Secondary | ICD-10-CM | POA: Diagnosis not present

## 2022-04-30 DIAGNOSIS — M542 Cervicalgia: Secondary | ICD-10-CM | POA: Diagnosis not present

## 2022-04-30 DIAGNOSIS — M545 Low back pain, unspecified: Secondary | ICD-10-CM | POA: Diagnosis not present

## 2022-04-30 DIAGNOSIS — F419 Anxiety disorder, unspecified: Secondary | ICD-10-CM | POA: Diagnosis not present

## 2022-04-30 DIAGNOSIS — G894 Chronic pain syndrome: Secondary | ICD-10-CM | POA: Diagnosis not present

## 2022-04-30 DIAGNOSIS — F32A Depression, unspecified: Secondary | ICD-10-CM | POA: Diagnosis not present

## 2022-04-30 DIAGNOSIS — G8929 Other chronic pain: Secondary | ICD-10-CM | POA: Diagnosis not present

## 2022-04-30 MED ORDER — HYDROCODONE-ACETAMINOPHEN 10-325 MG PO TABS
0.5000 | ORAL_TABLET | Freq: Four times a day (QID) | ORAL | 0 refills | Status: DC | PRN
  Filled 2022-04-30: qty 120, 30d supply, fill #0

## 2022-04-30 MED ORDER — FENTANYL 25 MCG/HR TD PT72
1.0000 | MEDICATED_PATCH | TRANSDERMAL | 0 refills | Status: DC
  Filled 2022-05-08: qty 10, 30d supply, fill #0

## 2022-05-06 ENCOUNTER — Other Ambulatory Visit (HOSPITAL_BASED_OUTPATIENT_CLINIC_OR_DEPARTMENT_OTHER): Payer: Self-pay

## 2022-05-06 DIAGNOSIS — Z452 Encounter for adjustment and management of vascular access device: Secondary | ICD-10-CM | POA: Diagnosis not present

## 2022-05-06 DIAGNOSIS — K912 Postsurgical malabsorption, not elsewhere classified: Secondary | ICD-10-CM | POA: Diagnosis not present

## 2022-05-06 DIAGNOSIS — E1142 Type 2 diabetes mellitus with diabetic polyneuropathy: Secondary | ICD-10-CM | POA: Diagnosis not present

## 2022-05-06 DIAGNOSIS — R1310 Dysphagia, unspecified: Secondary | ICD-10-CM | POA: Diagnosis not present

## 2022-05-06 DIAGNOSIS — K2270 Barrett's esophagus without dysplasia: Secondary | ICD-10-CM | POA: Diagnosis not present

## 2022-05-06 DIAGNOSIS — E43 Unspecified severe protein-calorie malnutrition: Secondary | ICD-10-CM | POA: Diagnosis not present

## 2022-05-07 DIAGNOSIS — K912 Postsurgical malabsorption, not elsewhere classified: Secondary | ICD-10-CM | POA: Diagnosis not present

## 2022-05-07 DIAGNOSIS — Z452 Encounter for adjustment and management of vascular access device: Secondary | ICD-10-CM | POA: Diagnosis not present

## 2022-05-07 DIAGNOSIS — R1310 Dysphagia, unspecified: Secondary | ICD-10-CM | POA: Diagnosis not present

## 2022-05-07 DIAGNOSIS — E43 Unspecified severe protein-calorie malnutrition: Secondary | ICD-10-CM | POA: Diagnosis not present

## 2022-05-07 DIAGNOSIS — K2270 Barrett's esophagus without dysplasia: Secondary | ICD-10-CM | POA: Diagnosis not present

## 2022-05-07 DIAGNOSIS — E1142 Type 2 diabetes mellitus with diabetic polyneuropathy: Secondary | ICD-10-CM | POA: Diagnosis not present

## 2022-05-08 ENCOUNTER — Other Ambulatory Visit (HOSPITAL_BASED_OUTPATIENT_CLINIC_OR_DEPARTMENT_OTHER): Payer: Self-pay

## 2022-05-13 DIAGNOSIS — E1142 Type 2 diabetes mellitus with diabetic polyneuropathy: Secondary | ICD-10-CM | POA: Diagnosis not present

## 2022-05-13 DIAGNOSIS — K2270 Barrett's esophagus without dysplasia: Secondary | ICD-10-CM | POA: Diagnosis not present

## 2022-05-13 DIAGNOSIS — R1310 Dysphagia, unspecified: Secondary | ICD-10-CM | POA: Diagnosis not present

## 2022-05-13 DIAGNOSIS — K912 Postsurgical malabsorption, not elsewhere classified: Secondary | ICD-10-CM | POA: Diagnosis not present

## 2022-05-13 DIAGNOSIS — E43 Unspecified severe protein-calorie malnutrition: Secondary | ICD-10-CM | POA: Diagnosis not present

## 2022-05-13 DIAGNOSIS — Z452 Encounter for adjustment and management of vascular access device: Secondary | ICD-10-CM | POA: Diagnosis not present

## 2022-05-20 DIAGNOSIS — E43 Unspecified severe protein-calorie malnutrition: Secondary | ICD-10-CM | POA: Diagnosis not present

## 2022-05-20 DIAGNOSIS — Z452 Encounter for adjustment and management of vascular access device: Secondary | ICD-10-CM | POA: Diagnosis not present

## 2022-05-20 DIAGNOSIS — R1310 Dysphagia, unspecified: Secondary | ICD-10-CM | POA: Diagnosis not present

## 2022-05-20 DIAGNOSIS — E1142 Type 2 diabetes mellitus with diabetic polyneuropathy: Secondary | ICD-10-CM | POA: Diagnosis not present

## 2022-05-20 DIAGNOSIS — K2270 Barrett's esophagus without dysplasia: Secondary | ICD-10-CM | POA: Diagnosis not present

## 2022-05-20 DIAGNOSIS — K912 Postsurgical malabsorption, not elsewhere classified: Secondary | ICD-10-CM | POA: Diagnosis not present

## 2022-05-21 DIAGNOSIS — K912 Postsurgical malabsorption, not elsewhere classified: Secondary | ICD-10-CM | POA: Diagnosis not present

## 2022-05-21 DIAGNOSIS — L600 Ingrowing nail: Secondary | ICD-10-CM | POA: Diagnosis not present

## 2022-05-21 DIAGNOSIS — R590 Localized enlarged lymph nodes: Secondary | ICD-10-CM | POA: Diagnosis not present

## 2022-05-21 DIAGNOSIS — R7309 Other abnormal glucose: Secondary | ICD-10-CM | POA: Diagnosis not present

## 2022-05-22 DIAGNOSIS — H40023 Open angle with borderline findings, high risk, bilateral: Secondary | ICD-10-CM | POA: Diagnosis not present

## 2022-05-23 DIAGNOSIS — I739 Peripheral vascular disease, unspecified: Secondary | ICD-10-CM | POA: Diagnosis not present

## 2022-05-23 DIAGNOSIS — Z48815 Encounter for surgical aftercare following surgery on the digestive system: Secondary | ICD-10-CM | POA: Diagnosis not present

## 2022-05-23 DIAGNOSIS — R1319 Other dysphagia: Secondary | ICD-10-CM | POA: Diagnosis not present

## 2022-05-23 DIAGNOSIS — Z8639 Personal history of other endocrine, nutritional and metabolic disease: Secondary | ICD-10-CM | POA: Diagnosis not present

## 2022-05-23 DIAGNOSIS — E43 Unspecified severe protein-calorie malnutrition: Secondary | ICD-10-CM | POA: Diagnosis not present

## 2022-05-24 ENCOUNTER — Other Ambulatory Visit: Payer: Self-pay | Admitting: Neurology

## 2022-05-24 ENCOUNTER — Telehealth: Payer: Self-pay | Admitting: Neurology

## 2022-05-24 MED ORDER — BUTALBITAL-APAP-CAFFEINE 50-325-40 MG PO TABS: 1.0000 | ORAL_TABLET | Freq: Four times a day (QID) | ORAL | 2 refills | Status: DC | PRN

## 2022-05-24 NOTE — Telephone Encounter (Signed)
Pt called in and left a message. She would like to see if Dr. Everlena Cooper could send a refill of her fioricet one more time? She says she is being weaned off of her TPN and will be off of it in a few weeks. That TPN is causing her headaches. She is going to keep the ports in case they have to add the TPN back.

## 2022-05-24 NOTE — Telephone Encounter (Signed)
Called patient to let her know it has been sent LVM

## 2022-05-27 DIAGNOSIS — Z452 Encounter for adjustment and management of vascular access device: Secondary | ICD-10-CM | POA: Diagnosis not present

## 2022-05-27 DIAGNOSIS — K912 Postsurgical malabsorption, not elsewhere classified: Secondary | ICD-10-CM | POA: Diagnosis not present

## 2022-05-27 DIAGNOSIS — R1310 Dysphagia, unspecified: Secondary | ICD-10-CM | POA: Diagnosis not present

## 2022-05-27 DIAGNOSIS — E43 Unspecified severe protein-calorie malnutrition: Secondary | ICD-10-CM | POA: Diagnosis not present

## 2022-05-27 DIAGNOSIS — K2270 Barrett's esophagus without dysplasia: Secondary | ICD-10-CM | POA: Diagnosis not present

## 2022-05-27 DIAGNOSIS — E1142 Type 2 diabetes mellitus with diabetic polyneuropathy: Secondary | ICD-10-CM | POA: Diagnosis not present

## 2022-05-28 DIAGNOSIS — K2270 Barrett's esophagus without dysplasia: Secondary | ICD-10-CM | POA: Diagnosis not present

## 2022-05-28 DIAGNOSIS — Z452 Encounter for adjustment and management of vascular access device: Secondary | ICD-10-CM | POA: Diagnosis not present

## 2022-05-28 DIAGNOSIS — F411 Generalized anxiety disorder: Secondary | ICD-10-CM | POA: Diagnosis not present

## 2022-05-28 DIAGNOSIS — R1310 Dysphagia, unspecified: Secondary | ICD-10-CM | POA: Diagnosis not present

## 2022-05-28 DIAGNOSIS — I341 Nonrheumatic mitral (valve) prolapse: Secondary | ICD-10-CM | POA: Diagnosis not present

## 2022-05-28 DIAGNOSIS — N200 Calculus of kidney: Secondary | ICD-10-CM | POA: Diagnosis not present

## 2022-05-28 DIAGNOSIS — F339 Major depressive disorder, recurrent, unspecified: Secondary | ICD-10-CM | POA: Diagnosis not present

## 2022-05-28 DIAGNOSIS — Z8711 Personal history of peptic ulcer disease: Secondary | ICD-10-CM | POA: Diagnosis not present

## 2022-05-28 DIAGNOSIS — G8929 Other chronic pain: Secondary | ICD-10-CM | POA: Diagnosis not present

## 2022-05-28 DIAGNOSIS — E1142 Type 2 diabetes mellitus with diabetic polyneuropathy: Secondary | ICD-10-CM | POA: Diagnosis not present

## 2022-05-28 DIAGNOSIS — E1122 Type 2 diabetes mellitus with diabetic chronic kidney disease: Secondary | ICD-10-CM | POA: Diagnosis not present

## 2022-05-28 DIAGNOSIS — M810 Age-related osteoporosis without current pathological fracture: Secondary | ICD-10-CM | POA: Diagnosis not present

## 2022-05-28 DIAGNOSIS — Z9181 History of falling: Secondary | ICD-10-CM | POA: Diagnosis not present

## 2022-05-28 DIAGNOSIS — Z791 Long term (current) use of non-steroidal anti-inflammatories (NSAID): Secondary | ICD-10-CM | POA: Diagnosis not present

## 2022-05-28 DIAGNOSIS — K912 Postsurgical malabsorption, not elsewhere classified: Secondary | ICD-10-CM | POA: Diagnosis not present

## 2022-05-28 DIAGNOSIS — I340 Nonrheumatic mitral (valve) insufficiency: Secondary | ICD-10-CM | POA: Diagnosis not present

## 2022-05-28 DIAGNOSIS — Z79891 Long term (current) use of opiate analgesic: Secondary | ICD-10-CM | POA: Diagnosis not present

## 2022-05-28 DIAGNOSIS — N189 Chronic kidney disease, unspecified: Secondary | ICD-10-CM | POA: Diagnosis not present

## 2022-05-28 DIAGNOSIS — E43 Unspecified severe protein-calorie malnutrition: Secondary | ICD-10-CM | POA: Diagnosis not present

## 2022-05-28 DIAGNOSIS — H3530 Unspecified macular degeneration: Secondary | ICD-10-CM | POA: Diagnosis not present

## 2022-05-28 DIAGNOSIS — M797 Fibromyalgia: Secondary | ICD-10-CM | POA: Diagnosis not present

## 2022-05-28 DIAGNOSIS — D509 Iron deficiency anemia, unspecified: Secondary | ICD-10-CM | POA: Diagnosis not present

## 2022-05-28 DIAGNOSIS — E559 Vitamin D deficiency, unspecified: Secondary | ICD-10-CM | POA: Diagnosis not present

## 2022-05-28 DIAGNOSIS — G43909 Migraine, unspecified, not intractable, without status migrainosus: Secondary | ICD-10-CM | POA: Diagnosis not present

## 2022-05-28 DIAGNOSIS — D631 Anemia in chronic kidney disease: Secondary | ICD-10-CM | POA: Diagnosis not present

## 2022-05-30 ENCOUNTER — Other Ambulatory Visit: Payer: Self-pay | Admitting: Physician Assistant

## 2022-05-30 DIAGNOSIS — I10 Essential (primary) hypertension: Secondary | ICD-10-CM | POA: Diagnosis not present

## 2022-05-30 DIAGNOSIS — L089 Local infection of the skin and subcutaneous tissue, unspecified: Secondary | ICD-10-CM | POA: Diagnosis not present

## 2022-05-30 DIAGNOSIS — D508 Other iron deficiency anemias: Secondary | ICD-10-CM

## 2022-05-31 ENCOUNTER — Inpatient Hospital Stay

## 2022-05-31 ENCOUNTER — Telehealth: Payer: Self-pay | Admitting: Physician Assistant

## 2022-05-31 ENCOUNTER — Inpatient Hospital Stay: Admitting: Physician Assistant

## 2022-05-31 ENCOUNTER — Telehealth: Payer: Self-pay | Admitting: *Deleted

## 2022-05-31 NOTE — Telephone Encounter (Signed)
Pt called to cancel her appts today. She has come off her TPN and is having trouble maintaining an adequate Glucose level.  I will send message to have her re-scheduled.

## 2022-06-03 ENCOUNTER — Other Ambulatory Visit

## 2022-06-03 ENCOUNTER — Ambulatory Visit: Admitting: Hematology and Oncology

## 2022-06-03 DIAGNOSIS — R1310 Dysphagia, unspecified: Secondary | ICD-10-CM | POA: Diagnosis not present

## 2022-06-03 DIAGNOSIS — Z452 Encounter for adjustment and management of vascular access device: Secondary | ICD-10-CM | POA: Diagnosis not present

## 2022-06-03 DIAGNOSIS — K2270 Barrett's esophagus without dysplasia: Secondary | ICD-10-CM | POA: Diagnosis not present

## 2022-06-03 DIAGNOSIS — E1142 Type 2 diabetes mellitus with diabetic polyneuropathy: Secondary | ICD-10-CM | POA: Diagnosis not present

## 2022-06-03 DIAGNOSIS — K912 Postsurgical malabsorption, not elsewhere classified: Secondary | ICD-10-CM | POA: Diagnosis not present

## 2022-06-03 DIAGNOSIS — E43 Unspecified severe protein-calorie malnutrition: Secondary | ICD-10-CM | POA: Diagnosis not present

## 2022-06-04 ENCOUNTER — Encounter (HOSPITAL_COMMUNITY): Payer: Self-pay

## 2022-06-04 ENCOUNTER — Ambulatory Visit (HOSPITAL_COMMUNITY)
Admission: EM | Admit: 2022-06-04 | Discharge: 2022-06-04 | Disposition: A | Discharge status: Home / Self Care | Payer: Medicare Other | Attending: Emergency Medicine | Admitting: Emergency Medicine

## 2022-06-04 ENCOUNTER — Ambulatory Visit (INDEPENDENT_AMBULATORY_CARE_PROVIDER_SITE_OTHER): Payer: Medicare Other

## 2022-06-04 DIAGNOSIS — M4316 Spondylolisthesis, lumbar region: Secondary | ICD-10-CM | POA: Diagnosis not present

## 2022-06-04 DIAGNOSIS — S30810A Abrasion of lower back and pelvis, initial encounter: Secondary | ICD-10-CM

## 2022-06-04 DIAGNOSIS — M546 Pain in thoracic spine: Secondary | ICD-10-CM

## 2022-06-04 DIAGNOSIS — M545 Low back pain, unspecified: Secondary | ICD-10-CM

## 2022-06-04 DIAGNOSIS — Z760 Encounter for issue of repeat prescription: Secondary | ICD-10-CM

## 2022-06-04 DIAGNOSIS — Z043 Encounter for examination and observation following other accident: Secondary | ICD-10-CM | POA: Diagnosis not present

## 2022-06-04 DIAGNOSIS — G8929 Other chronic pain: Secondary | ICD-10-CM

## 2022-06-04 DIAGNOSIS — R0781 Pleurodynia: Secondary | ICD-10-CM | POA: Diagnosis not present

## 2022-06-04 MED ORDER — IBUPROFEN 800 MG PO TABS
400.0000 mg | ORAL_TABLET | Freq: Once | ORAL | Status: AC
  Administered 2022-06-04: 400 mg via ORAL

## 2022-06-04 MED ORDER — CELECOXIB 200 MG PO CAPS: 200.0000 mg | ORAL_CAPSULE | Freq: Two times a day (BID) | ORAL | 0 refills | Status: AC

## 2022-06-04 MED ORDER — IBUPROFEN 800 MG PO TABS
ORAL_TABLET | ORAL | Status: AC
  Filled 2022-06-04: qty 1

## 2022-06-04 NOTE — ED Provider Notes (Signed)
HPI  SUBJECTIVE:  Kayla [name] is a 77 y.o. female who presents with constant, sharp diffuse lower back pain after having a slip and fall in the bathroom earlier today, states that her feet slipped out from underneath her as she was trying to sit down on the toilet. she hit her lower lumbar and right thoracic region on the toilet lid, sliding all the way down.  She is not sure if there is any bruising or swelling.  No change in her lower extremity numbness, tingling.  No leg weakness, saddle anesthesia.  No coughing, wheezing, shortness of breath.  She tried taking half of a 10/325 Norco, which is her usual pain regimen, without improvement in her symptoms.  Last dose at 0900.  She also wears a 25 mcg fentanyl patch at baseline.  Her pain is worse with inspiration, movement, torso rotation, walking.  She denies hitting her head, loss of consciousness.  She has a past medical history of hypertension, fibromyalgia, diffuse degenerative disc disease, peripheral neuropathy with a spinal nerve stimulator, osteoporosis, mitral regurgitation, peripheral vascular disease, severe reactive hypoglycemia/diabetes.  Denies history of chronic kidney disease, arrhythmia/dysrhythmia.  PCP: Dr. Duanne Guess    Past Medical History:  Diagnosis Date   Adjustment disorder with depressed mood 03/18/2007   Allergy    Anal fissure 10/08/2016   Angina    takes Propanolol prn and it stops her chest   Anorexia nervosa 11/11/2019   Barrett esophagus    not consistently present   Cataract    removed  but had retinal detachment - repeat cataract right eye   Cervical facet joint syndrome 10/08/2016   Chronic constipation 10/04/2010   Chronic interstitial cystitis 10/16/2011   Chronic kidney disease    kidney stones and UTI   Chronic maxillary sinusitis 09/15/2013   Chronic narcotic dependence 10/03/2018   Complication of anesthesia    either  BP or pulse dropped with last surgery   Degeneration of cervical  intervertebral disc 03/18/2007   Degeneration of lumbar or lumbosacral intervertebral disc 03/18/2007   Diaphragmatic hernia 03/18/2007   Dysrhythmia    brought on by stress   Epigastric pain 11/11/2019   Esophageal dysphagia 10/03/2018   External hemorrhoids    Fibromyalgia    neuropathy knees ankles and toes, bladder   Generalized anxiety disorder 03/18/2007   GERD (gastroesophageal reflux disease) 03/18/2007   HA (headache)-chronic/tension type 07/28/2012   Heart murmur    Hiatal hernia    History of anemia    History of gastric bypass 02/11/2014   1999 at [facility]   History of hyperparathyroidism 01/22/2016   History of kidney stones    Hypertension    Hypoglycemia 11/08/2016   IBS (irritable bowel syndrome)    Internal hemorrhoids    LLQ abdominal pain 11/11/2019   Macular degeneration    Malnutrition 11/11/2019   Mechanical breakdown implanted electronic neurostimulator spinal cord 11/16/2018   Mitral regurgitation 12/11/2015   MVP (mitral valve prolapse) 12/17/2017   Myalgia 03/18/2007   Nephrolithiasis 07/28/2012   lithotrip 2013-[place],MD   Neuropathic pain 01/23/2018   Orthostatic hypotension 11/20/2016   Osteoarthritis    all over   Osteopetrosis    Osteoporosis 07/28/2012   Peripheral vascular disease    superficial phlebitis in left calf   Personal history of colonic polyps 07/22/2013   07/2013 - 3 small adenomas - repeat colonoscopy 2018   PTSD (post-traumatic stress disorder)    Rosacea 11/02/2017   Small intestinal bacterial overgrowth 08/05/2017   +  lactulose breath test 06/2017 Xifaxan Rx   Type II diabetes mellitus 11/18/2016    Past Surgical History:  Procedure Laterality Date   ABDOMINAL HYSTERECTOMY     APPENDECTOMY     CARDIAC CATHETERIZATION  03/22/1991   EF 61%   CARDIOVASCULAR STRESS TEST  10/03/2006   CERVICAL SPINE SURGERY  02/14/2022   BLOCK   CHOLECYSTECTOMY     COLONOSCOPY  06/23/2008   normal   DILATION AND CURETTAGE OF  UTERUS     x2   ESOPHAGUS SURGERY     EXPLORATORY LAPAROTOMY  1993   EYE SURGERY     GASTRIC BYPASS  1999   GASTRIC RESTRICTION SURGERY  1991   LASIK Bilateral    Right Arm Surgery     Right Knee Arthroscopy     Rt wrist fx  2009   spinal surgery battery implant     stomach stappeling  1991   STONE EXTRACTION WITH BASKET  2012   TONSILLECTOMY     TUBAL LIGATION     UPPER GASTROINTESTINAL ENDOSCOPY  06/23/2008   w/Dil, Barrett's esophagus   US ECHOCARDIOGRAPHY  01/21/2007   EF 55-60%   US ECHOCARDIOGRAPHY  08/30/2003   EF 55-60%    Family History  Problem Relation Age of Onset   Stroke Mother    Lung cancer Mother    Heart disease Mother    Diabetes Mother    Hypertension Father    Stroke Father    Heart disease Father    Kidney disease Father    Seizures Father        epilepsy, and sisters x 2   Memory loss Father    Lung cancer Sister    Breast cancer Sister    Cancer - Lung Sister    Heart disease Sister    Diabetes Sister    Pancreatic cancer Sister    Healthy Daughter    Healthy Son    Colon cancer Maternal Aunt        12 relatives   Colon cancer Paternal Aunt    Colon cancer Paternal Aunt    Uterine cancer Other        aunt   Heart disease Other        grandmother   Esophageal cancer Neg Hx    Rectal cancer Neg Hx    Stomach cancer Neg Hx    Colon polyps Neg Hx     Social History   Tobacco Use   Smoking status: Never    Passive exposure: Past   Smokeless tobacco: Never  Vaping Use   Vaping Use: Never used  Substance Use Topics   Alcohol use: No   Drug use: No     Current Facility-Administered Medications:    ibuprofen (ADVIL) tablet 400 mg, 400 mg, Oral, Once, Domenick Gong, MD  Current Outpatient Medications:    aspirin-acetaminophen-caffeine (EXCEDRIN MIGRAINE) 250-250-65 MG tablet, Take 2 tablets by mouth 2 (two) times daily., Disp: , Rfl:    Biotin 1000 MCG CHEW, Chew 1 tablet by mouth daily., Disp: , Rfl:     butalbital-acetaminophen-caffeine (FIORICET) 50-325-40 MG tablet, Take 1 tablet by mouth every 6 (six) hours as needed for headache., Disp: 10 tablet, Rfl: 2   celecoxib (CELEBREX) 200 MG capsule, Take 1 capsule (200 mg total) by mouth 2 (two) times daily for 7 days., Disp: 14 capsule, Rfl: 0   clonazePAM (KLONOPIN) 0.5 MG tablet, Take 0.25 mg by mouth as needed., Disp: , Rfl:  COLLAGEN PO, Take by mouth daily., Disp: , Rfl:    Continuous Blood Gluc Receiver (DEXCOM G7 RECEIVER) DEVI, by Does not apply route., Disp: , Rfl:    Copper Gluconate POWD, Take by mouth., Disp: , Rfl:    cyclobenzaprine (FLEXERIL) 10 MG tablet, Take by mouth. (Patient not taking: Reported on 03/20/2022), Disp: , Rfl:    cycloSPORINE (RESTASIS) 0.05 % ophthalmic emulsion, Place 1 drop into both eyes 2 (two) times daily., Disp: 5.5 mL, Rfl: 5   dicyclomine (BENTYL) 10 MG capsule, Take 1-2 tab 3 times daily AC as needed for spasms and cramping., Disp: 270 capsule, Rfl: 1   Docusate Sodium (DSS) 100 MG CAPS, Take by mouth., Disp: , Rfl:    Erenumab-aooe 70 MG/ML SOAJ, Inject into the skin., Disp: , Rfl:    famotidine (PEPCID) 20 MG tablet, Take by mouth., Disp: , Rfl:    fentaNYL (DURAGESIC) 25 MCG/HR, Place 1 patch onto the skin every 3 (three) days (every 72 hours)., Disp: 10 patch, Rfl: 0   furosemide (LASIX) 40 MG tablet, Take by mouth., Disp: , Rfl:    Galcanezumab-gnlm (EMGALITY) 120 MG/ML SOAJ, Inject 120 mg into the skin every 28 (twenty-eight) days., Disp: 3 pen, Rfl: 3   glucagon 1 MG injection, Inject 1 mg into the muscle once as needed for up to 1 dose., Disp: 1 each, Rfl: 12   HYDROcodone-acetaminophen (NORCO) 10-325 MG tablet, Take 1/2 to 1 tablet four (4) times daily, if needed for pain, max daily dose: 4 tablets, Disp: 60 tablet, Rfl: 0   HYDROcodone-acetaminophen (NORCO) 10-325 MG tablet, Take 0.5-1 tablets by mouth 4 (four) times daily as needed for pain. Max daily dose: 4 tablets., Disp: 120 tablet, Rfl: 0    HYDROcodone-acetaminophen (NORCO) 10-325 MG tablet, Take 0.5-1 tablets by mouth 4 (four) times daily as needed for pain. Max daily dose: 4 tablets., Disp: 120 tablet, Rfl: 0   hydrocortisone cream 1 %, Apply topically., Disp: , Rfl:    hypromellose (GENTEAL) 0.3 % GEL ophthalmic ointment, Place 1 drop into both eyes at bedtime. Reported on 02/11/2015, Disp: , Rfl:    latanoprost (XALATAN) 0.005 % ophthalmic solution, Apply to eye., Disp: , Rfl:    lidocaine (LIDODERM) 5 %, Place 1 patch onto the skin as needed. Remove & Discard patch within 12 hours. May dispense as 3 month supply, Disp: 90 patch, Rfl: 3   Lifitegrast 5 % SOLN, Place 1 drop into both eyes daily., Disp: , Rfl:    magnesium hydroxide (MILK OF MAGNESIA) 400 MG/5ML suspension, Take by mouth. (Patient not taking: Reported on 03/20/2022), Disp: , Rfl:    metroNIDAZOLE (METROGEL) 1 % gel, Apply topically daily., Disp: 45 g, Rfl: 0   mirabegron ER (MYRBETRIQ) 25 MG TB24 tablet, Take 1 tablet (25 mg total) by mouth daily., Disp: 90 tablet, Rfl: 1   naloxegol oxalate (MOVANTIK) 25 MG TABS tablet, Take 25 mg by mouth daily., Disp: , Rfl:    Naloxone HCl 0.4 MG/0.4ML SOAJ, Administer 0.4mg  [place] at first sign of opioid overdose and repeat every 2 minutes as needed for resuscitation. Call 911 immediately, Disp: 5 Package, Rfl: 0   NON FORMULARY, [facility]  Peripheral Neuropathy Cream- Bupivacaine 1%, Doxepin 3%, Gabapentin 6%, Pentoxifylline 3%, Topiramate 1% Apply 1-2 grams to affected area 3-4 times daily Qty. 120 gm 3 refills, Disp: , Rfl:    NON FORMULARY, GABA6%LIDO5%DICL3%, Disp: , Rfl:    nystatin-triamcinolone (MYCOLOG II) cream, , Disp: , Rfl:  ondansetron (ZOFRAN) 8 MG tablet, Take 1 tablet (8 mg total) by mouth every 8 (eight) hours as needed for nausea or vomiting., Disp: 60 tablet, Rfl: 0   phenazopyridine (PYRIDIUM) 95 MG tablet, Take by mouth. (Patient not taking: Reported on 03/20/2022), Disp: , Rfl:    polyethylene glycol  (MIRALAX / GLYCOLAX) 17 g packet, Take by mouth., Disp: , Rfl:    potassium chloride SA (KLOR-CON M) 20 MEQ tablet, Take by mouth., Disp: , Rfl:    pregabalin (LYRICA) 50 MG capsule, Take 1 capsule in morning and 2 capsules at night, Disp: 90 capsule, Rfl: 5   Probiotic Product (PROBIOTIC-10 PO), Take by mouth. , Disp: , Rfl:    senna-docusate (SENOKOT-S) 8.6-50 MG tablet, Take by mouth., Disp: , Rfl:    sucralfate (CARAFATE) 1 GM/10ML suspension, Take 10 mLs (1 g total) by mouth 4 (four) times daily., Disp: 420 mL, Rfl: 1   sulfamethoxazole-trimethoprim (BACTRIM DS) 800-160 MG tablet, Take 1 tablet by mouth 2 (two) times daily. (Patient not taking: Reported on 03/20/2022), Disp: , Rfl:    SUMAtriptan (IMITREX) 100 MG tablet, Take 1 tablet at earliest onset of headache, may repeat in 2 hours if headache persists or reoccurs., Disp: 30 tablet, Rfl: 1   Vitamin D, Ergocalciferol, (DRISDOL) 1.25 MG (50000 UNIT) CAPS capsule, Take 1 capsule (50,000 Units total) by mouth every 7 (seven) days., Disp: 12 capsule, Rfl: 0  Allergies  Allergen Reactions   Ambien [Zolpidem Tartrate]     Hallucinations   Codeine Anaphylaxis   Oxycodone-Acetaminophen Shortness Of Breath   Tylox [Oxycodone-Acetaminophen] Anaphylaxis    Chest pain   Zolpidem Tartrate Anaphylaxis and Other (See Comments)    Other Reaction(s): Hallucination  Other reaction(s): Other, hallucinations, Other reaction(s): Other, hallucinations, hallucinations   Darvocet [Propoxyphene N-Acetaminophen]     Chest pain   Darvon [Propoxyphene] Itching   Lorcet [Hydrocodone-Acetaminophen]     Chest pain   Opium     hallucinations   Vicodin [Hydrocodone-Acetaminophen] Other (See Comments)    Chest Pain    Wheat    Amoxicillin Nausea And Vomiting    Reaction:  Migraine headache   Penicillins Nausea And Vomiting    Migraine Headaches     ROS  As noted in HPI.   Physical Exam  BP 116/63 (BP Location: Left Arm)   Pulse 93   Temp 98.8 F  (37.1 C) (Oral)   Resp 18   SpO2 97%   Constitutional: Well developed, well nourished, appears uncomfortable with movement Eyes:  EOMI, conjunctiva normal bilaterally HENT: Normocephalic, atraumatic,mucus membranes moist Respiratory: Limited inspiratory effort.  Lungs clear bilaterally.  Positive tenderness posterior lower right ribs.  No appreciable crepitus.  No other rib cage tenderness Cardiovascular: Normal rate, regular rhythm, no appreciable murmur GI: nondistended skin: Positive tender abrasion over L-spine, right posterior thoracic region  Musculoskeletal: Swelling, tenderness over the L-spine and left paralumbar region.  No SI joint, sacral bony tenderness.  Patient ambulatory. Neurologic: Alert & oriented x 3, no focal neuro deficits Psychiatric: Speech and behavior appropriate   ED Course   Medications  ibuprofen (ADVIL) tablet 400 mg (has no administration in time range)    Orders Placed This Encounter  Procedures   DG Ribs Unilateral W/Chest Right    Standing Status:   Standing    Number of Occurrences:   1    Order Specific Question:   Reason for Exam (SYMPTOM  OR DIAGNOSIS REQUIRED)    Answer:   Direct trauma to  posterior right lower ribs, diffuse tenderness, pain with inspiration, rule out pneumothorax and pleural effusion, rib fracture   DG Lumbar Spine Complete    Standing Status:   Standing    Number of Occurrences:   1    Order Specific Question:   Reason for Exam (SYMPTOM  OR DIAGNOSIS REQUIRED)    Answer:   Direct trauma to lumbar region.  History of osteoporosis.  Rule out fracture.    No results found. However, due to the size of the patient record, not all encounters were searched. Please check Results Review for a complete set of results. DG Lumbar Spine Complete  Result Date: 06/04/2022 CLINICAL DATA:  Trauma, fall EXAM: LUMBAR SPINE - COMPLETE 4+ VIEW COMPARISON:  Previous studies including the lumbar spine radiograph done on 04/03/2017 FINDINGS:  No recent fracture is seen. There is minimal anterolisthesis at L4-L5 level with no significant interval change. There is no significant disc space narrowing. Surgical staples are seen in the epigastrium. Neurostimulator lead battery is seen in the right posterior aspect. IMPRESSION: No recent fracture is seen in the lumbar spine. Minimal anterolisthesis at L4-L5 level has not changed. Electronically Signed   By: Ernie Avena M.D.   On: 06/04/2022 13:25   DG Ribs Unilateral W/Chest Right  Result Date: 06/04/2022 CLINICAL DATA:  Trauma, pain EXAM: RIGHT RIBS AND CHEST - 3+ VIEW COMPARISON:  09/12/2016 FINDINGS: Cardiac size is within normal limits. There are no signs of pulmonary edema or focal pulmonary consolidation. There is no pleural effusion or pneumothorax. Tip of right IJ central venous catheter is seen in superior vena cava. There is interval placement of neurostimulator lead in thoracic spinal canal. A metallic pellet is seen marking the area of patient's symptoms overlying the posteromedial aspect of right chest wall. No displaced fractures are seen in right ribs. IMPRESSION: No displaced fractures are seen in right ribs. No active cardiopulmonary disease is seen. Electronically Signed   By: Ernie Avena M.D.   On: 06/04/2022 13:21    ED Clinical Impression  1. Abrasion of lower back, initial encounter   2. Acute midline low back pain without sciatica   3. Acute right-sided thoracic back pain   4. Medication refill   5. Other chronic pain      ED Assessment/Plan     Calculated creatinine clearance from labs done in March 24 78 mL/min.  Will give single dose of ibuprofen as she took a Norco about 3 hours ago.  Checking right rib series and L-spine given history of osteoporosis to rule out acute fractures.  Reviewed imaging independently.  No pneumothorax, pleural effusion.  No displaced rib fractures or acute lumbar fractures seen.  See radiology report for full  details.  Patient with an abrasion to the back and thoracic region.  Will refill patient's Celebrex per patient request.  Ice to the area.  Continue home narcotic pain regimen as needed.  Follow-up with her PCP in 2 days as scheduled.  ER return precautions given.  Discussed imaging, MDM, treatment plan, and plan for follow-up with patient. Discussed sn/sx that should prompt return to the ED. patient agrees with plan.   Meds ordered this encounter  Medications   ibuprofen (ADVIL) tablet 400 mg   celecoxib (CELEBREX) 200 MG capsule    Sig: Take 1 capsule (200 mg total) by mouth 2 (two) times daily for 7 days.    Dispense:  14 capsule    Refill:  0      *This  clinic note was created using Scientist, clinical (histocompatibility and immunogenetics). Therefore, there may be occasional mistakes despite careful proofreading.  ?    Domenick Gong, MD 06/04/22 1336

## 2022-06-04 NOTE — ED Triage Notes (Signed)
Pt states slipped and fell in her bathroom around 9am and hit the toilet. C/o rt back/rib pain. States she has a spinal nerve implant and wants to make sure it's not damaged. States the implant is turned off d/t not helping. States took half a hydrocodone today and wears a fentanyl patch for chronic pain.

## 2022-06-04 NOTE — Discharge Instructions (Signed)
Start the Celebrex in addition to taking her usual pain medications.  Your imaging was negative for any spine or rib fractures.  I would ice your back in the acute phase, then you can try heat.

## 2022-06-06 DIAGNOSIS — D509 Iron deficiency anemia, unspecified: Secondary | ICD-10-CM | POA: Diagnosis not present

## 2022-06-06 DIAGNOSIS — W19XXXA Unspecified fall, initial encounter: Secondary | ICD-10-CM | POA: Diagnosis not present

## 2022-06-06 DIAGNOSIS — Z7185 Encounter for immunization safety counseling: Secondary | ICD-10-CM | POA: Diagnosis not present

## 2022-06-06 DIAGNOSIS — I340 Nonrheumatic mitral (valve) insufficiency: Secondary | ICD-10-CM | POA: Diagnosis not present

## 2022-06-06 DIAGNOSIS — Z23 Encounter for immunization: Secondary | ICD-10-CM | POA: Diagnosis not present

## 2022-06-06 DIAGNOSIS — R634 Abnormal weight loss: Secondary | ICD-10-CM | POA: Diagnosis not present

## 2022-06-06 DIAGNOSIS — G629 Polyneuropathy, unspecified: Secondary | ICD-10-CM | POA: Diagnosis not present

## 2022-06-06 DIAGNOSIS — K219 Gastro-esophageal reflux disease without esophagitis: Secondary | ICD-10-CM | POA: Diagnosis not present

## 2022-06-06 DIAGNOSIS — N3289 Other specified disorders of bladder: Secondary | ICD-10-CM | POA: Diagnosis not present

## 2022-06-07 ENCOUNTER — Other Ambulatory Visit: Payer: Self-pay | Admitting: Rheumatology

## 2022-06-07 DIAGNOSIS — E559 Vitamin D deficiency, unspecified: Secondary | ICD-10-CM

## 2022-06-11 ENCOUNTER — Other Ambulatory Visit: Payer: Self-pay | Admitting: Physician Assistant

## 2022-06-11 ENCOUNTER — Other Ambulatory Visit (HOSPITAL_BASED_OUTPATIENT_CLINIC_OR_DEPARTMENT_OTHER): Payer: Self-pay

## 2022-06-11 DIAGNOSIS — F32A Depression, unspecified: Secondary | ICD-10-CM | POA: Diagnosis not present

## 2022-06-11 DIAGNOSIS — E43 Unspecified severe protein-calorie malnutrition: Secondary | ICD-10-CM | POA: Diagnosis not present

## 2022-06-11 DIAGNOSIS — K2270 Barrett's esophagus without dysplasia: Secondary | ICD-10-CM | POA: Diagnosis not present

## 2022-06-11 DIAGNOSIS — G609 Hereditary and idiopathic neuropathy, unspecified: Secondary | ICD-10-CM | POA: Diagnosis not present

## 2022-06-11 DIAGNOSIS — E1142 Type 2 diabetes mellitus with diabetic polyneuropathy: Secondary | ICD-10-CM | POA: Diagnosis not present

## 2022-06-11 DIAGNOSIS — G8929 Other chronic pain: Secondary | ICD-10-CM | POA: Diagnosis not present

## 2022-06-11 DIAGNOSIS — K912 Postsurgical malabsorption, not elsewhere classified: Secondary | ICD-10-CM | POA: Diagnosis not present

## 2022-06-11 DIAGNOSIS — Z452 Encounter for adjustment and management of vascular access device: Secondary | ICD-10-CM | POA: Diagnosis not present

## 2022-06-11 DIAGNOSIS — M545 Low back pain, unspecified: Secondary | ICD-10-CM | POA: Diagnosis not present

## 2022-06-11 DIAGNOSIS — F419 Anxiety disorder, unspecified: Secondary | ICD-10-CM | POA: Diagnosis not present

## 2022-06-11 DIAGNOSIS — D508 Other iron deficiency anemias: Secondary | ICD-10-CM

## 2022-06-11 DIAGNOSIS — M542 Cervicalgia: Secondary | ICD-10-CM | POA: Diagnosis not present

## 2022-06-11 DIAGNOSIS — R1310 Dysphagia, unspecified: Secondary | ICD-10-CM | POA: Diagnosis not present

## 2022-06-11 MED ORDER — HYDROCODONE-ACETAMINOPHEN 10-325 MG PO TABS
0.5000 | ORAL_TABLET | Freq: Four times a day (QID) | ORAL | 0 refills | Status: DC | PRN
  Filled 2022-06-11: qty 120, 30d supply, fill #0

## 2022-06-11 MED ORDER — FENTANYL 25 MCG/HR TD PT72
1.0000 | MEDICATED_PATCH | TRANSDERMAL | 0 refills | Status: DC
  Filled 2022-06-11: qty 10, 30d supply, fill #0

## 2022-06-12 ENCOUNTER — Other Ambulatory Visit: Payer: Self-pay

## 2022-06-12 ENCOUNTER — Inpatient Hospital Stay: Payer: Medicare Other | Attending: Physician Assistant

## 2022-06-12 ENCOUNTER — Inpatient Hospital Stay (HOSPITAL_BASED_OUTPATIENT_CLINIC_OR_DEPARTMENT_OTHER): Payer: Medicare Other | Admitting: Physician Assistant

## 2022-06-12 VITALS — BP 112/60 | HR 90 | Temp 98.1°F | Resp 18 | Ht 65.0 in | Wt 128.1 lb

## 2022-06-12 DIAGNOSIS — D508 Other iron deficiency anemias: Secondary | ICD-10-CM

## 2022-06-12 DIAGNOSIS — D509 Iron deficiency anemia, unspecified: Secondary | ICD-10-CM | POA: Diagnosis not present

## 2022-06-12 LAB — CBC WITH DIFFERENTIAL (CANCER CENTER ONLY)
Abs Immature Granulocytes: 0.02 10*3/uL (ref 0.00–0.07)
Basophils Absolute: 0 10*3/uL (ref 0.0–0.1)
Basophils Relative: 1 %
Eosinophils Absolute: 0.5 10*3/uL (ref 0.0–0.5)
Eosinophils Relative: 6 %
HCT: 37.1 % (ref 36.0–46.0)
Hemoglobin: 12.3 g/dL (ref 12.0–15.0)
Immature Granulocytes: 0 %
Lymphocytes Relative: 42 %
Lymphs Abs: 3.2 10*3/uL (ref 0.7–4.0)
MCH: 30.9 pg (ref 26.0–34.0)
MCHC: 33.2 g/dL (ref 30.0–36.0)
MCV: 93.2 fL (ref 80.0–100.0)
Monocytes Absolute: 0.5 10*3/uL (ref 0.1–1.0)
Monocytes Relative: 6 %
Neutro Abs: 3.4 10*3/uL (ref 1.7–7.7)
Neutrophils Relative %: 45 %
Platelet Count: 267 10*3/uL (ref 150–400)
RBC: 3.98 MIL/uL (ref 3.87–5.11)
RDW: 13.6 % (ref 11.5–15.5)
Smear Review: NORMAL
WBC Count: 7.6 10*3/uL (ref 4.0–10.5)
nRBC: 0 % (ref 0.0–0.2)

## 2022-06-12 LAB — CMP (CANCER CENTER ONLY)
ALT: 24 U/L (ref 0–44)
AST: 30 U/L (ref 15–41)
Albumin: 3.3 g/dL — ABNORMAL LOW (ref 3.5–5.0)
Alkaline Phosphatase: 66 U/L (ref 38–126)
Anion gap: 3 — ABNORMAL LOW (ref 5–15)
BUN: 23 mg/dL (ref 8–23)
CO2: 28 mmol/L (ref 22–32)
Calcium: 8.2 mg/dL — ABNORMAL LOW (ref 8.9–10.3)
Chloride: 110 mmol/L (ref 98–111)
Creatinine: 0.6 mg/dL (ref 0.44–1.00)
GFR, Estimated: >60 mL/min (ref >60)
Glucose, Bld: 102 mg/dL — ABNORMAL HIGH (ref 70–99)
Potassium: 4.8 mmol/L (ref 3.5–5.1)
Sodium: 141 mmol/L (ref 135–145)
Total Bilirubin: 0.3 mg/dL (ref 0.3–1.2)
Total Protein: 5.3 g/dL — ABNORMAL LOW (ref 6.5–8.1)

## 2022-06-12 LAB — IRON AND IRON BINDING CAPACITY (CC-WL,HP ONLY)
Iron: 81 ug/dL (ref 28–170)
Saturation Ratios: 29 % (ref 10.4–31.8)
TIBC: 281 ug/dL (ref 250–450)
UIBC: 200 ug/dL (ref 148–442)

## 2022-06-12 LAB — FERRITIN: Ferritin: 17 ng/mL (ref 11–307)

## 2022-06-12 NOTE — Progress Notes (Signed)
[facility] [phone]   [phone]  PROGRESS NOTE  Patient Care Team: [facility] as PCP - General Nahser, Deloris Ping, MD as PCP - Cardiology (Cardiology) Rainwater, Marilynn Rail, PA-C as Physician Assistant (Physician Assistant) Pete Glatter, Danford Bad., MD as Referring Physician (Urology) Nelson Chimes, MD as Consulting Physician (Ophthalmology) Iva Boop, MD as Consulting Physician (Gastroenterology) Drema Dallas, DO as Consulting Physician (Neurology) Pollyann Savoy, MD as Consulting Physician (Rheumatology) Vidal Schwalbe, PsyD (Inactive) (Psychiatry) Sharman Cheek, DMD (Dentistry) Genene Churn, DC as Referring Physician (Chiropractic Medicine) Linna Darner, RD as Dietitian (Family Medicine) Linna Darner, RD as Dietitian (Family Medicine)  Hematological/Oncological History # Iron Deficiency Anemia 2/2 to Gastric Bypass 12/04/2020: WBC 6.6, Hgb 11.0, MCV 86.9, Plt 293 03/26/2021: WBC 5.9, Hgb 10.3, MCV 86.3, Plt 306, ferritin 8.2 04/18/2021: establish care with Dr. Leonides Schanz  04/19/2021: Received 1 dose of IV Monoferric 1000 mg 05/30/2021: WBC 7.5, Hgb 12.7, MCV 89.6, Plt 264  Interval History:  Kayla [name] 77 y.o. female with medical history significant for iron deficiency anemia secondary to gastric bypass who presents for a follow up visit. The patient's last visit was on 11/30/2021 at which time she established care. In the interim since the last visit she completed TPN therapy with [facility].   On exam today Kayla [name] reports that she has gained 25 lbs with TPN and plans to remove her IV catheter in the near future. She reports her energy has improved with the TPN therapy. She has no other concerning symptoms. She currently denies any fevers, chills, sweats, nausea, or diarrhea.  A full 10 point ROS is listed below.  MEDICAL HISTORY:  Past Medical History:  Diagnosis Date   Adjustment disorder  with depressed mood 03/18/2007   Allergy    Anal fissure 10/08/2016   Angina    takes Propanolol prn and it stops her chest   Anorexia nervosa 11/11/2019   Barrett esophagus    not consistently present   Cataract    removed  but had retinal detachment - repeat cataract right eye   Cervical facet joint syndrome 10/08/2016   Chronic constipation 10/04/2010   Chronic interstitial cystitis 10/16/2011   Chronic kidney disease    kidney stones and UTI   Chronic maxillary sinusitis 09/15/2013   Chronic narcotic dependence 10/03/2018   Complication of anesthesia    either  BP or pulse dropped with last surgery   Degeneration of cervical intervertebral disc 03/18/2007   Degeneration of lumbar or lumbosacral intervertebral disc 03/18/2007   Diaphragmatic hernia 03/18/2007   Dysrhythmia    brought on by stress   Epigastric pain 11/11/2019   Esophageal dysphagia 10/03/2018   External hemorrhoids    Fibromyalgia    neuropathy knees ankles and toes, bladder   Generalized anxiety disorder 03/18/2007   GERD (gastroesophageal reflux disease) 03/18/2007   HA (headache)-chronic/tension type 07/28/2012   Heart murmur    Hiatal hernia    History of anemia    History of gastric bypass 02/11/2014   1999 at [facility]   History of hyperparathyroidism 01/22/2016   History of kidney stones    Hypertension    Hypoglycemia 11/08/2016   IBS (irritable bowel syndrome)    Internal hemorrhoids    LLQ abdominal pain 11/11/2019   Macular degeneration    Malnutrition 11/11/2019   Mechanical breakdown implanted electronic neurostimulator spinal cord 11/16/2018   Mitral regurgitation 12/11/2015   MVP (mitral valve prolapse)  12/17/2017   Myalgia 03/18/2007   Nephrolithiasis 07/28/2012   lithotrip 2013-[place]skridge,MD   Neuropathic pain 01/23/2018   Orthostatic hypotension 11/20/2016   Osteoarthritis    all over   Osteopetrosis    Osteoporosis 07/28/2012   Peripheral vascular disease    superficial  phlebitis in left calf   Personal history of colonic polyps 07/22/2013   07/2013 - 3 small adenomas - repeat colonoscopy 2018   PTSD (post-traumatic stress disorder)    Rosacea 11/02/2017   Small intestinal bacterial overgrowth 08/05/2017   + lactulose breath test 06/2017 Xifaxan Rx   Type II diabetes mellitus 11/18/2016    SURGICAL HISTORY: Past Surgical History:  Procedure Laterality Date   ABDOMINAL HYSTERECTOMY     APPENDECTOMY     CARDIAC CATHETERIZATION  03/22/1991   EF 61%   CARDIOVASCULAR STRESS TEST  10/03/2006   CERVICAL SPINE SURGERY  02/14/2022   BLOCK   CHOLECYSTECTOMY     COLONOSCOPY  06/23/2008   normal   DILATION AND CURETTAGE OF UTERUS     x2   ESOPHAGUS SURGERY     EXPLORATORY LAPAROTOMY  1993   EYE SURGERY     GASTRIC BYPASS  1999   GASTRIC RESTRICTION SURGERY  1991   LASIK Bilateral    Right Arm Surgery     Right Knee Arthroscopy     Rt wrist fx  2009   spinal surgery battery implant     stomach stappeling  1991   STONE EXTRACTION WITH BASKET  2012   TONSILLECTOMY     TUBAL LIGATION     UPPER GASTROINTESTINAL ENDOSCOPY  06/23/2008   w/Dil, Barrett's esophagus   US ECHOCARDIOGRAPHY  01/21/2007   EF 55-60%   US ECHOCARDIOGRAPHY  08/30/2003   EF 55-60%    SOCIAL HISTORY: Social History   Socioeconomic History   Marital status: Married    Spouse name: Cynda Familia.   Number of children: 2   Years of education: 15   Highest education level: Some college, no degree  Occupational History   Occupation: Disability    Employer: RETIRED  Tobacco Use   Smoking status: Never    Passive exposure: Past   Smokeless tobacco: Never  Vaping Use   Vaping Use: Never used  Substance and Sexual Activity   Alcohol use: No   Drug use: No   Sexual activity: Not Currently  Other Topics Concern   Not on file  Social History Narrative   Right handed   Two story home   She retired on disability   Married   2 Children   Social Determinants of Manufacturing engineer Strain: Not on file  Food Insecurity: Not on file  Transportation Needs: Not on file  Physical Activity: Not on file  Stress: Not on file  Social Connections: Not on file  Intimate Partner Violence: Not on file    FAMILY HISTORY: Family History  Problem Relation Age of Onset   Stroke Mother    Lung cancer Mother    Heart disease Mother    Diabetes Mother    Hypertension Father    Stroke Father    Heart disease Father    Kidney disease Father    Seizures Father        epilepsy, and sisters x 2   Memory loss Father    Lung cancer Sister    Breast cancer Sister    Cancer - Lung Sister    Heart disease Sister  Diabetes Sister    Pancreatic cancer Sister    Healthy Daughter    Healthy Son    Colon cancer Maternal Aunt        12 relatives   Colon cancer Paternal Aunt    Colon cancer Paternal Aunt    Uterine cancer Other        aunt   Heart disease Other        grandmother   Esophageal cancer Neg Hx    Rectal cancer Neg Hx    Stomach cancer Neg Hx    Colon polyps Neg Hx     ALLERGIES:  is allergic to [employer] tartrate], codeine, oxycodone-acetaminophen, tylox [oxycodone-acetaminophen], zolpidem tartrate, darvocet [propoxyphene n-acetaminophen], darvon [propoxyphene], lorcet [hydrocodone-acetaminophen], opium, vicodin [hydrocodone-acetaminophen], wheat, amoxicillin, and penicillins.  MEDICATIONS:  Current Outpatient Medications  Medication Sig Dispense Refill   aspirin-acetaminophen-caffeine (EXCEDRIN MIGRAINE) 250-250-65 MG tablet Take 2 tablets by mouth 2 (two) times daily.     Biotin 1000 MCG CHEW Chew 1 tablet by mouth daily.     butalbital-acetaminophen-caffeine (FIORICET) 50-325-40 MG tablet Take 1 tablet by mouth every 6 (six) hours as needed for headache. 10 tablet 2   clonazePAM (KLONOPIN) 0.5 MG tablet Take 0.25 mg by mouth as needed.     COLLAGEN PO Take by mouth daily.     Continuous Blood Gluc Receiver (DEXCOM G7 RECEIVER)  DEVI by Does not apply route.     Copper Gluconate POWD Take by mouth.     dicyclomine (BENTYL) 10 MG capsule Take 1-2 tab 3 times daily AC as needed for spasms and cramping. 270 capsule 1   Docusate Sodium (DSS) 100 MG CAPS Take by mouth.     Erenumab-aooe 70 MG/ML SOAJ Inject into the skin.     famotidine (PEPCID) 20 MG tablet Take by mouth.     fentaNYL (DURAGESIC) 25 MCG/HR Place 1 patch onto the skin every 3 (three) days. 10 patch 0   Galcanezumab-gnlm (EMGALITY) 120 MG/ML SOAJ Inject 120 mg into the skin every 28 (twenty-eight) days. 3 pen 3   glucagon 1 MG injection Inject 1 mg into the muscle once as needed for up to 1 dose. 1 each 12   HYDROcodone-acetaminophen (NORCO) 10-325 MG tablet Take 0.5-1 tablets by mouth 4 (four) times daily as needed. (Patient taking differently: Take 1 tablet by mouth 4 (four) times daily as needed. Taking three times a day as needed) 120 tablet 0   hydrocortisone cream 1 % Apply topically.     hypromellose (GENTEAL) 0.3 % GEL ophthalmic ointment Place 1 drop into both eyes at bedtime. Reported on 02/11/2015     latanoprost (XALATAN) 0.005 % ophthalmic solution Apply to eye.     lidocaine (LIDODERM) 5 % Place 1 patch onto the skin as needed. Remove & Discard patch within 12 hours. May dispense as 3 month supply 90 patch 3   Lifitegrast 5 % SOLN Place 1 drop into both eyes daily.     magnesium hydroxide (MILK OF MAGNESIA) 400 MG/5ML suspension Take by mouth.     metroNIDAZOLE (METROGEL) 1 % gel Apply topically daily. 45 g 0   mirabegron ER (MYRBETRIQ) 25 MG TB24 tablet Take 1 tablet (25 mg total) by mouth daily. 90 tablet 1   naloxegol oxalate (MOVANTIK) 25 MG TABS tablet Take 25 mg by mouth daily.     Naloxone HCl 0.4 MG/0.4ML SOAJ Administer 0.4mg  [address] at first sign of opioid overdose and repeat every 2 minutes as needed for resuscitation. Call  911 immediately 5 Package 0   NON FORMULARY [facility]  Peripheral Neuropathy Cream- Bupivacaine 1%, Doxepin  3%, Gabapentin 6%, Pentoxifylline 3%, Topiramate 1% Apply 1-2 grams to affected area 3-4 times daily Qty. 120 gm 3 refills     NON FORMULARY GABA6%LIDO5%DICL3%     nystatin-triamcinolone (MYCOLOG II) cream      ondansetron (ZOFRAN) 8 MG tablet Take 1 tablet (8 mg total) by mouth every 8 (eight) hours as needed for nausea or vomiting. 60 tablet 0   phenazopyridine (PYRIDIUM) 95 MG tablet Take by mouth.     polyethylene glycol (MIRALAX / GLYCOLAX) 17 g packet Take by mouth.     potassium chloride SA (KLOR-CON M) 20 MEQ tablet Take by mouth.     pregabalin (LYRICA) 50 MG capsule Take 1 capsule in morning and 2 capsules at night 90 capsule 5   Probiotic Product (PROBIOTIC-10 PO) Take by mouth.      senna-docusate (SENOKOT-S) 8.6-50 MG tablet Take by mouth.     sucralfate (CARAFATE) 1 GM/10ML suspension Take 10 mLs (1 g total) by mouth 4 (four) times daily. 420 mL 1   sulfamethoxazole-trimethoprim (BACTRIM DS) 800-160 MG tablet Take 1 tablet by mouth 2 (two) times daily.     SUMAtriptan (IMITREX) 100 MG tablet Take 1 tablet at earliest onset of headache, may repeat in 2 hours if headache persists or reoccurs. 30 tablet 1   Vitamin D, Ergocalciferol, (DRISDOL) 1.25 MG (50000 UNIT) CAPS capsule Take 1 capsule (50,000 Units total) by mouth every 7 (seven) days. 12 capsule 0   cyclobenzaprine (FLEXERIL) 10 MG tablet Take by mouth. (Patient not taking: Reported on 06/12/2022)     cycloSPORINE (RESTASIS) 0.05 % ophthalmic emulsion Place 1 drop into both eyes 2 (two) times daily. (Patient not taking: Reported on 06/12/2022) 5.5 mL 5   furosemide (LASIX) 40 MG tablet Take by mouth. (Patient not taking: Reported on 06/12/2022)     HYDROcodone-acetaminophen (NORCO) 10-325 MG tablet Take 1/2 to 1 tablet four (4) times daily, if needed for pain, max daily dose: 4 tablets 60 tablet 0   HYDROcodone-acetaminophen (NORCO) 10-325 MG tablet Take 0.5-1 tablets by mouth 4 (four) times daily as needed for pain. Max daily  dose: 4 tablets. 120 tablet 0   HYDROcodone-acetaminophen (NORCO) 10-325 MG tablet Take 0.5-1 tablets by mouth 4 (four) times daily as needed for pain. Max daily dose: 4 tablets. 120 tablet 0   No current facility-administered medications for this visit.    REVIEW OF SYSTEMS:   Constitutional: ( - ) fevers, ( - )  chills , ( - ) night sweats Eyes: ( - ) blurriness of vision, ( - ) double vision, ( - ) watery eyes Ears, nose, mouth, throat, and face: ( - ) mucositis, ( - ) sore throat Respiratory: ( - ) cough, ( - ) dyspnea, ( - ) wheezes Cardiovascular: ( - ) palpitation, ( - ) chest discomfort, ( - ) lower extremity swelling Gastrointestinal:  ( - ) nausea, ( - ) heartburn, ( - ) change in bowel habits Skin: ( - ) abnormal skin rashes Lymphatics: ( - ) new lymphadenopathy, ( - ) easy bruising Neurological: ( - ) numbness, ( - ) tingling, ( - ) new weaknesses Behavioral/Psych: ( - ) mood change, ( - ) new changes  All other systems were reviewed with the patient and are negative.  PHYSICAL EXAMINATION:  Vitals:   06/12/22 1329  BP: 112/60  Pulse: 90  Resp: 18  Temp: 98.1 F (36.7 C)  SpO2: 98%   Filed Weights   06/12/22 1329  Weight: 128 lb 1.6 oz (58.1 kg)    GENERAL: Cachectic appearing elderly Caucasian female, no distress and comfortable SKIN: skin color, texture, turgor are normal, no rashes or significant lesions EYES: conjunctiva are pink and non-injected, sclera clear LUNGS: clear to auscultation and percussion with normal breathing effort HEART: regular rate & rhythm and no murmurs and no lower extremity edema Musculoskeletal: no cyanosis of digits and no clubbing  PSYCH: alert & oriented x 3, fluent speech NEURO: no focal motor/sensory deficits  LABORATORY DATA:  I have reviewed the data as listed    Latest Ref Rng & Units 06/12/2022    1:08 PM 03/19/2022   11:58 AM 11/30/2021    9:59 AM  CBC  WBC 4.0 - 10.5 K/uL 7.6  9.0  7.3   Hemoglobin 12.0 - 15.0 g/dL  56.2  13.0  86.5   Hematocrit 36.0 - 46.0 % 37.1  42.1  35.1   Platelets 150 - 400 K/uL 267  271  268        Latest Ref Rng & Units 06/12/2022    1:08 PM 03/19/2022   11:58 AM 11/30/2021    9:59 AM  CMP  Glucose 70 - 99 mg/dL 784  88  696   BUN 8 - 23 mg/dL 23  31  41   Creatinine 0.44 - 1.00 mg/dL 2.95  2.84  1.32   Sodium 135 - 145 mmol/L 141  141  142   Potassium 3.5 - 5.1 mmol/L 4.8  5.5  4.2   Chloride 98 - 111 mmol/L 110  103  105   CO2 22 - 32 mmol/L 28  33  34   Calcium 8.9 - 10.3 mg/dL 8.2  9.2  9.1   Total Protein 6.5 - 8.1 g/dL 5.3  5.4  6.0   Total Bilirubin 0.3 - 1.2 mg/dL 0.3  0.3  0.3   Alkaline Phos 38 - 126 U/L 66   50   AST 15 - 41 U/L 30  29  34   ALT 0 - 44 U/L 24  28  29      Lab Results  Component Value Date   MPROTEIN Not Observed 04/03/2017    RADIOGRAPHIC STUDIES: DG Lumbar Spine Complete  Result Date: 06/04/2022 CLINICAL DATA:  Trauma, fall EXAM: LUMBAR SPINE - COMPLETE 4+ VIEW COMPARISON:  Previous studies including the lumbar spine radiograph done on 04/03/2017 FINDINGS: No recent fracture is seen. There is minimal anterolisthesis at L4-L5 level with no significant interval change. There is no significant disc space narrowing. Surgical staples are seen in the epigastrium. Neurostimulator lead battery is seen in the right posterior aspect. IMPRESSION: No recent fracture is seen in the lumbar spine. Minimal anterolisthesis at L4-L5 level has not changed. Electronically Signed   By: Ernie Avena M.D.   On: 06/04/2022 13:25   DG Ribs Unilateral W/Chest Right  Result Date: 06/04/2022 CLINICAL DATA:  Trauma, pain EXAM: RIGHT RIBS AND CHEST - 3+ VIEW COMPARISON:  09/12/2016 FINDINGS: Cardiac size is within normal limits. There are no signs of pulmonary edema or focal pulmonary consolidation. There is no pleural effusion or pneumothorax. Tip of right IJ central venous catheter is seen in superior vena cava. There is interval placement of neurostimulator  lead in thoracic spinal canal. A metallic pellet is seen marking the area of patient's symptoms overlying the posteromedial aspect of right chest wall. No  displaced fractures are seen in right ribs. IMPRESSION: No displaced fractures are seen in right ribs. No active cardiopulmonary disease is seen. Electronically Signed   By: Ernie Avena M.D.   On: 06/04/2022 13:21    ASSESSMENT & PLAN Kayla [name] is a 77 y.o.  female with medical history significant for iron deficiency anemia secondary to gastric bypass who presents for a follow up visit.  # Iron Deficiency Anemia in the Setting of Gastric Bypass -- Patients with gastric bypass have difficulty absorbing iron which is a common cause of iron deficiency in this population. --Last received 1 dose of IV monoferric 1000 mg on 04/19/2021 --Labs today show hemoglobin 12.3,  WBC 7.6, MCV 93.2, Plt 267. Iron panel shows iron 81, saturation 29%, ferritin pending. -- No need for additional IV iron at this time.  --return to clinic in 6 months time to reevaluate  No orders of the defined types were placed in this encounter.   All questions were answered. The patient knows to call the clinic with any problems, questions or concerns.  A total of more than 25 minutes were spent on this encounter with face-to-face time and non-face-to-face time, including preparing to see the patient, ordering tests and/or medications, counseling the patient and coordination of care as outlined above.   Georga Kaufmann PA-C Dept of Hematology and Oncology [facility] Phone: [phone]   06/12/2022 1:51 PM

## 2022-06-13 ENCOUNTER — Encounter: Payer: Self-pay | Admitting: Physician Assistant

## 2022-06-13 DIAGNOSIS — K5900 Constipation, unspecified: Secondary | ICD-10-CM | POA: Diagnosis not present

## 2022-06-13 DIAGNOSIS — M1990 Unspecified osteoarthritis, unspecified site: Secondary | ICD-10-CM | POA: Diagnosis not present

## 2022-06-13 DIAGNOSIS — R110 Nausea: Secondary | ICD-10-CM | POA: Diagnosis not present

## 2022-06-13 NOTE — Addendum Note (Signed)
Addended by: Georga Kaufmann T on: 06/13/2022 10:46 AM   Modules accepted: Orders

## 2022-06-14 ENCOUNTER — Telehealth: Payer: Self-pay

## 2022-06-14 NOTE — Telephone Encounter (Signed)
Call attempt. Left pt a message:  that her ferritin levels have dropped. So we will arrange for IV iron to bolster  her iron levels.  Instructed pt to call with questions.

## 2022-06-17 DIAGNOSIS — R1310 Dysphagia, unspecified: Secondary | ICD-10-CM | POA: Diagnosis not present

## 2022-06-17 DIAGNOSIS — K912 Postsurgical malabsorption, not elsewhere classified: Secondary | ICD-10-CM | POA: Diagnosis not present

## 2022-06-17 DIAGNOSIS — E1142 Type 2 diabetes mellitus with diabetic polyneuropathy: Secondary | ICD-10-CM | POA: Diagnosis not present

## 2022-06-17 DIAGNOSIS — K2270 Barrett's esophagus without dysplasia: Secondary | ICD-10-CM | POA: Diagnosis not present

## 2022-06-17 DIAGNOSIS — Z452 Encounter for adjustment and management of vascular access device: Secondary | ICD-10-CM | POA: Diagnosis not present

## 2022-06-17 DIAGNOSIS — E43 Unspecified severe protein-calorie malnutrition: Secondary | ICD-10-CM | POA: Diagnosis not present

## 2022-06-24 ENCOUNTER — Ambulatory Visit (INDEPENDENT_AMBULATORY_CARE_PROVIDER_SITE_OTHER): Payer: Medicare Other

## 2022-06-24 ENCOUNTER — Ambulatory Visit

## 2022-06-24 VITALS — BP 117/71 | HR 96 | Temp 98.3°F | Resp 18 | Ht 65.0 in | Wt 125.8 lb

## 2022-06-24 DIAGNOSIS — Z9884 Bariatric surgery status: Secondary | ICD-10-CM

## 2022-06-24 DIAGNOSIS — D508 Other iron deficiency anemias: Secondary | ICD-10-CM | POA: Diagnosis not present

## 2022-06-24 MED ORDER — METHYLPREDNISOLONE SODIUM SUCC 125 MG IJ SOLR: 125.0000 mg | Freq: Once | INTRAMUSCULAR | Status: DC | PRN

## 2022-06-24 MED ORDER — ANTICOAGULANT SODIUM CITRATE 4% (200MG/5ML) IV SOLN: 5.0000 mL | Freq: Once | Status: DC | PRN

## 2022-06-24 MED ORDER — HEPARIN SOD (PORK) LOCK FLUSH 100 UNIT/ML IV SOLN: 250.0000 [IU] | Freq: Once | INTRAVENOUS | Status: DC | PRN

## 2022-06-24 MED ORDER — SODIUM CHLORIDE 0.9% FLUSH: 3.0000 mL | Freq: Once | INTRAVENOUS | Status: DC | PRN

## 2022-06-24 MED ORDER — SODIUM CHLORIDE 0.9 % IV SOLN: Freq: Once | INTRAVENOUS | Status: DC | PRN

## 2022-06-24 MED ORDER — SODIUM CHLORIDE 0.9 % IV SOLN
510.0000 mg | Freq: Once | INTRAVENOUS | Status: AC
  Administered 2022-06-24: 510 mg via INTRAVENOUS

## 2022-06-24 MED ORDER — ALTEPLASE 2 MG IJ SOLR: 2.0000 mg | Freq: Once | INTRAMUSCULAR | Status: DC | PRN

## 2022-06-24 MED ORDER — DIPHENHYDRAMINE HCL 50 MG/ML IJ SOLN: 50.0000 mg | Freq: Once | INTRAMUSCULAR | Status: DC | PRN

## 2022-06-24 MED ORDER — SODIUM CHLORIDE 0.9% FLUSH: 10.0000 mL | Freq: Once | INTRAVENOUS | Status: DC | PRN

## 2022-06-24 MED ORDER — HEPARIN SOD (PORK) LOCK FLUSH 100 UNIT/ML IV SOLN: 500.0000 [IU] | Freq: Once | INTRAVENOUS | Status: DC | PRN

## 2022-06-24 MED ORDER — ALBUTEROL SULFATE HFA 108 (90 BASE) MCG/ACT IN AERS: 2.0000 | INHALATION_SPRAY | Freq: Once | RESPIRATORY_TRACT | Status: DC | PRN

## 2022-06-24 MED ORDER — EPINEPHRINE 0.3 MG/0.3ML IJ SOAJ: 0.3000 mg | Freq: Once | INTRAMUSCULAR | Status: DC | PRN

## 2022-06-24 MED ORDER — FAMOTIDINE IN NACL 20-0.9 MG/50ML-% IV SOLN: 20.0000 mg | Freq: Once | INTRAVENOUS | Status: DC | PRN

## 2022-06-24 MED ORDER — SODIUM CHLORIDE 0.9 % IV SOLN
510.0000 mg | Freq: Once | INTRAVENOUS | Status: DC
  Filled 2022-06-24: qty 17

## 2022-06-24 NOTE — Progress Notes (Signed)
Diagnosis: Iron Deficiency Anemia  Provider:  Chilton Greathouse MD  Procedure: IV Infusion  IV Type: PICC, IV Location: R Chest  Feraheme (Ferumoxytol), Dose: 510 mg  Infusion Start Time: 1524  Infusion Stop Time: 1541  Post Infusion IV Care: Patient declined observation and PICC Line Flushed/Capped  Discharge: Condition: Good, Destination: Home . AVS Declined  Performed by:  Adriana Mccallum, RN

## 2022-06-25 ENCOUNTER — Ambulatory Visit: Payer: Medicare Other | Admitting: Podiatry

## 2022-06-25 ENCOUNTER — Encounter: Payer: Self-pay | Admitting: Physician Assistant

## 2022-06-25 DIAGNOSIS — E43 Unspecified severe protein-calorie malnutrition: Secondary | ICD-10-CM | POA: Diagnosis not present

## 2022-06-25 DIAGNOSIS — R1319 Other dysphagia: Secondary | ICD-10-CM | POA: Diagnosis not present

## 2022-06-25 DIAGNOSIS — Z452 Encounter for adjustment and management of vascular access device: Secondary | ICD-10-CM | POA: Diagnosis not present

## 2022-06-25 DIAGNOSIS — E46 Unspecified protein-calorie malnutrition: Secondary | ICD-10-CM | POA: Diagnosis not present

## 2022-06-26 ENCOUNTER — Telehealth: Payer: Self-pay | Admitting: Pharmacy Technician

## 2022-06-26 DIAGNOSIS — E43 Unspecified severe protein-calorie malnutrition: Secondary | ICD-10-CM | POA: Diagnosis not present

## 2022-06-26 DIAGNOSIS — K912 Postsurgical malabsorption, not elsewhere classified: Secondary | ICD-10-CM | POA: Diagnosis not present

## 2022-06-26 DIAGNOSIS — K2270 Barrett's esophagus without dysplasia: Secondary | ICD-10-CM | POA: Diagnosis not present

## 2022-06-26 DIAGNOSIS — R1310 Dysphagia, unspecified: Secondary | ICD-10-CM | POA: Diagnosis not present

## 2022-06-26 DIAGNOSIS — Z452 Encounter for adjustment and management of vascular access device: Secondary | ICD-10-CM | POA: Diagnosis not present

## 2022-06-26 DIAGNOSIS — E1142 Type 2 diabetes mellitus with diabetic polyneuropathy: Secondary | ICD-10-CM | POA: Diagnosis not present

## 2022-06-26 NOTE — Telephone Encounter (Signed)
Auth Submission: NO AUTH NEEDED Site of care: Site of care: [facility] Payer: MEDICARE A/B & [facility] Medication & CPT/J Code(s) submitted: Feraheme (ferumoxytol) [id number] Route of submission (phone, fax, portal):  Phone #[facility]: [phone] Fax # Auth type: Buy/Bill Units/visits requested:  Reference number: [facility]; NO AUTH NEEDED REF: [id number] Approval from: 06/26/22 to 10/26/22

## 2022-07-01 ENCOUNTER — Ambulatory Visit: Payer: Medicare Other

## 2022-07-01 VITALS — BP 102/61 | HR 85 | Temp 98.4°F | Resp 16 | Ht 65.0 in | Wt 128.6 lb

## 2022-07-01 DIAGNOSIS — Z9884 Bariatric surgery status: Secondary | ICD-10-CM

## 2022-07-01 DIAGNOSIS — D508 Other iron deficiency anemias: Secondary | ICD-10-CM

## 2022-07-01 MED ORDER — SODIUM CHLORIDE 0.9 % IV SOLN
510.0000 mg | Freq: Once | INTRAVENOUS | Status: AC
  Administered 2022-07-01: 510 mg via INTRAVENOUS
  Filled 2022-07-01: qty 17

## 2022-07-01 NOTE — Progress Notes (Signed)
Diagnosis: Iron Deficiency Anemia  Provider:  Chilton Greathouse MD  Procedure: IV Infusion  IV Type: Peripheral, IV Location: L Forearm  Feraheme (Ferumoxytol), Dose: 510 mg  Infusion Start Time: 0948  Infusion Stop Time: 1010  Post Infusion IV Care: Peripheral IV Discontinued  Discharge: Condition: Good, Destination: Home . AVS Declined  Performed by:  Garnette Czech, RN

## 2022-07-01 NOTE — Addendum Note (Signed)
Addended by: Desma Mcgregor on: 07/01/2022 12:23 PM   Modules accepted: Orders

## 2022-07-03 ENCOUNTER — Encounter (HOSPITAL_COMMUNITY): Payer: Self-pay | Admitting: Emergency Medicine

## 2022-07-03 ENCOUNTER — Ambulatory Visit (HOSPITAL_COMMUNITY)
Admission: EM | Admit: 2022-07-03 | Discharge: 2022-07-03 | Disposition: A | Discharge status: Home / Self Care | Payer: Medicare Other | Attending: Family Medicine | Admitting: Family Medicine

## 2022-07-03 DIAGNOSIS — N3090 Cystitis, unspecified without hematuria: Secondary | ICD-10-CM | POA: Diagnosis not present

## 2022-07-03 LAB — POCT URINALYSIS DIP (MANUAL ENTRY)
Bilirubin, UA: NEGATIVE
Blood, UA: NEGATIVE
Glucose, UA: NEGATIVE mg/dL
Ketones, POC UA: NEGATIVE mg/dL
Nitrite, UA: POSITIVE — AB
Protein Ur, POC: NEGATIVE mg/dL
Spec Grav, UA: >=1.03 — AB (ref 1.010–1.025)
Urobilinogen, UA: 1 E.U./dL
pH, UA: 5.5 (ref 5.0–8.0)

## 2022-07-03 MED ORDER — CEPHALEXIN 250 MG PO CAPS: 250.0000 mg | ORAL_CAPSULE | Freq: Three times a day (TID) | ORAL | 0 refills | Status: AC

## 2022-07-03 NOTE — ED Provider Notes (Signed)
[facility]    CSN: [id number] Arrival date & time: 07/03/22  1530      History   Chief Complaint Chief Complaint  Patient presents with   Dysuria   Urinary Incontinence    HPI Kayla [name] is a 77 y.o. female.    Dysuria  Here for incomplete bladder emptying and urinary frequency that began yesterday.  She began having dysuria and some intermittent hematuria today.  No fever or chills.  She is allergic to doxycycline and penicillins.  She has tolerated cephalosporins including Keflex in the past.  Past Medical History:  Diagnosis Date   Adjustment disorder with depressed mood 03/18/2007   Allergy    Anal fissure 10/08/2016   Angina    takes Propanolol prn and it stops her chest   Anorexia nervosa 11/11/2019   Barrett esophagus    not consistently present   Cataract    removed  but had retinal detachment - repeat cataract right eye   Cervical facet joint syndrome 10/08/2016   Chronic constipation 10/04/2010   Chronic interstitial cystitis 10/16/2011   Chronic kidney disease    kidney stones and UTI   Chronic maxillary sinusitis 09/15/2013   Chronic narcotic dependence 10/03/2018   Complication of anesthesia    either  BP or pulse dropped with last surgery   Degeneration of cervical intervertebral disc 03/18/2007   Degeneration of lumbar or lumbosacral intervertebral disc 03/18/2007   Diaphragmatic hernia 03/18/2007   Dysrhythmia    brought on by stress   Epigastric pain 11/11/2019   Esophageal dysphagia 10/03/2018   External hemorrhoids    Fibromyalgia    neuropathy knees ankles and toes, bladder   Generalized anxiety disorder 03/18/2007   GERD (gastroesophageal reflux disease) 03/18/2007   HA (headache)-chronic/tension type 07/28/2012   Heart murmur    Hiatal hernia    History of anemia    History of gastric bypass 02/11/2014   1999 at [facility]   History of hyperparathyroidism 01/22/2016   History of kidney stones    Hypertension     Hypoglycemia 11/08/2016   IBS (irritable bowel syndrome)    Internal hemorrhoids    LLQ abdominal pain 11/11/2019   Macular degeneration    Malnutrition 11/11/2019   Mechanical breakdown implanted electronic neurostimulator spinal cord 11/16/2018   Mitral regurgitation 12/11/2015   MVP (mitral valve prolapse) 12/17/2017   Myalgia 03/18/2007   Nephrolithiasis 07/28/2012   lithotrip 2013-[place],MD   Neuropathic pain 01/23/2018   Orthostatic hypotension 11/20/2016   Osteoarthritis    all over   Osteopetrosis    Osteoporosis 07/28/2012   Peripheral vascular disease    superficial phlebitis in left calf   Personal history of colonic polyps 07/22/2013   07/2013 - 3 small adenomas - repeat colonoscopy 2018   PTSD (post-traumatic stress disorder)    Rosacea 11/02/2017   Small intestinal bacterial overgrowth 08/05/2017   + lactulose breath test 06/2017 Xifaxan Rx   Type II diabetes mellitus 11/18/2016    Patient Active Problem List   Diagnosis Date Noted   Iron deficiency anemia due to dietary causes 04/18/2021   Peripheral neuropathy 04/03/2021   Chronic kidney disease 03/22/2021   Macular degeneration 03/22/2021   PTSD (post-traumatic stress disorder)    Epigastric pain 11/11/2019   Anorexia nervosa 11/11/2019   LLQ abdominal pain 11/11/2019   Malnutrition 11/11/2019   Mechanical breakdown implanted electronic neurostimulator spinal cord 11/16/2018   Esophageal dysphagia 10/03/2018   Neuropathic pain 01/23/2018   MVP (mitral  valve prolapse) 12/17/2017   Rosacea 11/02/2017   Small intestinal bacterial overgrowth 08/05/2017   Orthostatic hypotension 11/20/2016   Type II diabetes mellitus 11/18/2016   Hypoglycemia 11/08/2016   Cervical facet joint syndrome 10/08/2016   Anal fissure 10/08/2016   Peripheral vascular disease 01/22/2016   Hypertension 01/22/2016   History of hyperparathyroidism 01/22/2016   Mitral regurgitation 12/11/2015   History of gastric bypass  02/11/2014   Chronic maxillary sinusitis 09/15/2013   Personal history of colonic polyps 07/22/2013   HA (headache)-chronic/tension type 07/28/2012   Osteoporosis 07/28/2012   Nephrolithiasis 07/28/2012   Fibromyalgia 07/26/2012   Chronic interstitial cystitis 10/16/2011   Chronic constipation 10/04/2010   Loss of weight 11/27/2009   Generalized anxiety disorder 03/18/2007   Adjustment disorder with depressed mood 03/18/2007   GERD (gastroesophageal reflux disease) 03/18/2007   Diaphragmatic hernia 03/18/2007   Osteoarthritis 03/18/2007   Degeneration of cervical intervertebral disc 03/18/2007   Degeneration of lumbar or lumbosacral intervertebral disc 03/18/2007   Myalgia 03/18/2007    Past Surgical History:  Procedure Laterality Date   ABDOMINAL HYSTERECTOMY     APPENDECTOMY     CARDIAC CATHETERIZATION  03/22/1991   EF 61%   CARDIOVASCULAR STRESS TEST  10/03/2006   CERVICAL SPINE SURGERY  02/14/2022   BLOCK   CHOLECYSTECTOMY     COLONOSCOPY  06/23/2008   normal   DILATION AND CURETTAGE OF UTERUS     x2   ESOPHAGUS SURGERY     EXPLORATORY LAPAROTOMY  1993   EYE SURGERY     GASTRIC BYPASS  1999   GASTRIC RESTRICTION SURGERY  1991   LASIK Bilateral    Right Arm Surgery     Right Knee Arthroscopy     Rt wrist fx  2009   spinal surgery battery implant     stomach stappeling  1991   STONE EXTRACTION WITH BASKET  2012   TONSILLECTOMY     TUBAL LIGATION     UPPER GASTROINTESTINAL ENDOSCOPY  06/23/2008   w/Dil, Barrett's esophagus   US ECHOCARDIOGRAPHY  01/21/2007   EF 55-60%   US ECHOCARDIOGRAPHY  08/30/2003   EF 55-60%    OB History   No obstetric history on file.      Home Medications    Prior to Admission medications   Medication Sig Start Date End Date Taking? Authorizing Provider  cephALEXin (KEFLEX) 250 MG capsule Take 1 capsule (250 mg total) by mouth 3 (three) times daily for 7 days. 07/03/22 07/10/22 Yes Zenia Resides, MD   aspirin-acetaminophen-caffeine (EXCEDRIN MIGRAINE) [phone] MG tablet Take 2 tablets by mouth 2 (two) times daily.    [provider]  Biotin 1000 MCG CHEW Chew 1 tablet by mouth daily.    [provider]  butalbital-acetaminophen-caffeine (FIORICET) 50-325-40 MG tablet Take 1 tablet by mouth every 6 (six) hours as needed for headache. 05/24/22   Drema Dallas, DO  clonazePAM (KLONOPIN) 0.5 MG tablet Take 0.25 mg by mouth as needed.    [provider]  COLLAGEN PO Take by mouth daily.    [provider]  Continuous Blood Gluc Receiver (DEXCOM G7 RECEIVER) DEVI by Does not apply route.    [provider]  Copper Gluconate POWD Take by mouth.    [provider]  cyclobenzaprine (FLEXERIL) 10 MG tablet Take by mouth. Patient not taking: Reported on 06/12/2022 11/17/14   [provider]  cycloSPORINE (RESTASIS) 0.05 % ophthalmic emulsion Place 1 drop into both eyes 2 (two) times  daily. Patient not taking: Reported on 06/12/2022 11/08/16   Sherren Mocha, MD  dicyclomine (BENTYL) 10 MG capsule Take 1-2 tab 3 times daily AC as needed for spasms and cramping. 11/10/19   Zehr, Princella Pellegrini, PA-C  Docusate Sodium (DSS) 100 MG CAPS Take by mouth.    [provider]  Erenumab-aooe 70 MG/ML SOAJ Inject into the skin. 08/13/17   [provider]  famotidine (PEPCID) 20 MG tablet Take by mouth.    [provider]  fentaNYL (DURAGESIC) 25 MCG/HR Place 1 patch onto the skin every 3 (three) days. 06/11/22     furosemide (LASIX) 40 MG tablet Take by mouth. Patient not taking: Reported on 06/12/2022 02/12/17   [provider]  Galcanezumab-gnlm (EMGALITY) 120 MG/ML SOAJ Inject 120 mg into the skin every 28 (twenty-eight) days. 03/16/19   Everlena Cooper, Adam R, DO  glucagon 1 MG injection Inject 1 mg into the muscle once as needed for up to 1 dose. 05/12/19   Romero Belling, MD  HYDROcodone-acetaminophen [facility]) 10-325 MG tablet Take 0.5-1  tablets by mouth 4 (four) times daily as needed. Patient taking differently: Take 1 tablet by mouth 4 (four) times daily as needed. Taking three times a day as needed 06/11/22     hydrocortisone cream 1 % Apply topically.    [provider]  hypromellose (GENTEAL) 0.3 % GEL ophthalmic ointment Place 1 drop into both eyes at bedtime. Reported on 02/11/2015    [provider]  latanoprost (XALATAN) 0.005 % ophthalmic solution Apply to eye.    [provider]  lidocaine (LIDODERM) 5 % Place 1 patch onto the skin as needed. Remove & Discard patch within 12 hours. May dispense as 3 month supply 10/20/18   Doristine Bosworth, MD  Lifitegrast 5 % SOLN Place 1 drop into both eyes daily.    [provider]  magnesium hydroxide (MILK OF MAGNESIA) 400 MG/5ML suspension Take by mouth. 10/30/21   [provider]  metroNIDAZOLE (METROGEL) 1 % gel Apply topically daily. 10/20/18   Doristine Bosworth, MD  mirabegron ER (MYRBETRIQ) 25 MG TB24 tablet Take 1 tablet (25 mg total) by mouth daily. 10/20/18   Doristine Bosworth, MD  naloxegol oxalate (MOVANTIK) 25 MG TABS tablet Take 25 mg by mouth daily.    [provider]  Naloxone HCl 0.4 MG/0.4ML SOAJ Administer 0.4mg  Edwards at first sign of opioid overdose and repeat every 2 minutes as needed for resuscitation. Call 911 immediately 03/20/16   Sherren Mocha, MD  NON [facility]  Peripheral Neuropathy Cream- Bupivacaine 1%, Doxepin 3%, Gabapentin 6%, Pentoxifylline 3%, Topiramate 1% Apply 1-2 grams to affected area 3-4 times daily Qty. 120 gm 3 refills    [provider]  NON FORMULARY GABA6%LIDO5%DICL3%    [provider]  nystatin-triamcinolone (MYCOLOG II) cream  05/03/19   [provider]  ondansetron (ZOFRAN) 8 MG tablet Take 1 tablet (8 mg total) by mouth every 8 (eight) hours as needed for nausea or vomiting. 10/20/18   Doristine Bosworth, MD  phenazopyridine (PYRIDIUM) 95 MG tablet  Take by mouth. 12/15/18   [provider]  polyethylene glycol (MIRALAX / GLYCOLAX) 17 g packet Take by mouth. 10/30/21 10/30/22  [provider]  potassium chloride SA (KLOR-CON M) 20 MEQ tablet Take by mouth.    [provider]  pregabalin (LYRICA) 50 MG capsule Take 1 capsule in morning and 2 capsules at night 11/28/21   Drema Dallas,  DO  Probiotic Product (PROBIOTIC-10 PO) Take by mouth.     [provider]  senna-docusate (SENOKOT-S) 8.6-50 MG tablet Take by mouth. 10/30/21 10/30/22  [provider]  sucralfate (CARAFATE) 1 GM/10ML suspension Take 10 mLs (1 g total) by mouth 4 (four) times daily. 11/10/19   Zehr, Princella Pellegrini, PA-C  SUMAtriptan (IMITREX) 100 MG tablet Take 1 tablet at earliest onset of headache, may repeat in 2 hours if headache persists or reoccurs. 12/13/19   Drema Dallas, DO  Vitamin D, Ergocalciferol, (DRISDOL) 1.25 MG (50000 UNIT) CAPS capsule Take 1 capsule (50,000 Units total) by mouth every 7 (seven) days. 03/20/22   Pollyann Savoy, MD    Family History Family History  Problem Relation Age of Onset   Stroke Mother    Lung cancer Mother    Heart disease Mother    Diabetes Mother    Hypertension Father    Stroke Father    Heart disease Father    Kidney disease Father    Seizures Father        epilepsy, and sisters x 2   Memory loss Father    Lung cancer Sister    Breast cancer Sister    Cancer - Lung Sister    Heart disease Sister    Diabetes Sister    Pancreatic cancer Sister    Healthy Daughter    Healthy Son    Colon cancer Maternal Aunt        12 relatives   Colon cancer Paternal Aunt    Colon cancer Paternal Aunt    Uterine cancer Other        aunt   Heart disease Other        grandmother   Esophageal cancer Neg Hx    Rectal cancer Neg Hx    Stomach cancer Neg Hx    Colon polyps Neg Hx     Social History Social History   Tobacco Use   Smoking status: Never    Passive exposure: Past    Smokeless tobacco: Never  Vaping Use   Vaping Use: Never used  Substance Use Topics   Alcohol use: No   Drug use: No     Allergies   Ambien [zolpidem tartrate], Codeine, Oxycodone-acetaminophen, Tylox [oxycodone-acetaminophen], Zolpidem tartrate, Darvocet [propoxyphene n-acetaminophen], Darvon [propoxyphene], Lorcet [hydrocodone-acetaminophen], Opium, Vicodin [hydrocodone-acetaminophen], Wheat, Amoxicillin, and Penicillins   Review of Systems Review of Systems  Genitourinary:  Positive for dysuria.     Physical Exam Triage Vital Signs ED Triage Vitals  Enc Vitals Group     BP 07/03/22 1550 106/68     Pulse Rate 07/03/22 1550 96     Resp 07/03/22 1550 17     Temp 07/03/22 1550 97.7 F (36.5 C)     Temp Source 07/03/22 1550 Oral     SpO2 07/03/22 1550 95 %     Weight --      Height --      Head Circumference --      Peak Flow --      Pain Score 07/03/22 1551 6     Pain Loc --      Pain Edu? --      Excl. in GC? --    No data found.  Updated Vital Signs BP 106/68 (BP Location: Left Arm)   Pulse 96   Temp 97.7 F (36.5 C) (Oral)   Resp 17   SpO2 95%   Visual Acuity Right Eye Distance:   Left Eye Distance:  Bilateral Distance:    Right Eye Near:   Left Eye Near:    Bilateral Near:     Physical Exam   UC Treatments / Results  Labs (all labs ordered are listed, but only abnormal results are displayed) Labs Reviewed  POCT URINALYSIS DIP (MANUAL ENTRY) - Abnormal; Notable for the following components:      Result Value   Spec Grav, UA >=1.030 (*)    Nitrite, UA Positive (*)    Leukocytes, UA Small (1+) (*)    All other components within normal limits  URINE CULTURE    EKG   Radiology No results found.  Procedures Procedures (including critical care time)  Medications Ordered in UC Medications - No data to display  Initial Impression / Assessment and Plan / UC Course  I have reviewed the triage vital signs and the nursing  notes.  Pertinent labs & imaging results that were available during my care of the patient were reviewed by me and considered in my medical decision making (see chart for details).        Urinalysis shows some leukocytes and nitrites.  Urine culture is sent.  Cephalexin is sent in to treat the UTI.   she will continue to take AZO.    we will notify her if the culture shows she needs to have a different antibiotic c Final Clinical Impressions(s) / UC Diagnoses   Final diagnoses:  Cystitis     Discharge Instructions      The urinalysis showed some white cells and nitrites.  This is consistent with a bladder infection.  Take cephalexin 250 mg--1 capsule 3 times daily for 7 days  You can continue to take the AZO as needed  Make sure you are drinking plenty of fluids  Urine culture is sent, and staff will notify you if there is anythin that needs to be changed on your antibiotic.     ED Prescriptions     Medication Sig Dispense Auth. Provider   cephALEXin (KEFLEX) 250 MG capsule Take 1 capsule (250 mg total) by mouth 3 (three) times daily for 7 days. 21 capsule Zenia Resides, MD      I have reviewed the PDMP during this encounter.   Zenia Resides, MD 07/03/22 [phone]

## 2022-07-03 NOTE — ED Triage Notes (Signed)
Pt reports she's had dysuria, hematuria, and "trickles when comes out" yesterday. Today reports that she had 3 episodes of urinary incontinence today and urinary frequency. Took one Azo this morning but none since.

## 2022-07-03 NOTE — Discharge Instructions (Addendum)
The urinalysis showed some white cells and nitrites.  This is consistent with a bladder infection.  Take cephalexin 250 mg--1 capsule 3 times daily for 7 days  You can continue to take the AZO as needed  Make sure you are drinking plenty of fluids  Urine culture is sent, and staff will notify you if there is anythin that needs to be changed on your antibiotic.

## 2022-07-04 LAB — URINE CULTURE: Culture: >=100000 — AB

## 2022-07-05 LAB — URINE CULTURE

## 2022-07-09 ENCOUNTER — Other Ambulatory Visit (HOSPITAL_BASED_OUTPATIENT_CLINIC_OR_DEPARTMENT_OTHER): Payer: Self-pay

## 2022-07-09 DIAGNOSIS — M7062 Trochanteric bursitis, left hip: Secondary | ICD-10-CM | POA: Diagnosis not present

## 2022-07-09 DIAGNOSIS — M542 Cervicalgia: Secondary | ICD-10-CM | POA: Diagnosis not present

## 2022-07-09 DIAGNOSIS — F32A Depression, unspecified: Secondary | ICD-10-CM | POA: Diagnosis not present

## 2022-07-09 DIAGNOSIS — G609 Hereditary and idiopathic neuropathy, unspecified: Secondary | ICD-10-CM | POA: Diagnosis not present

## 2022-07-09 DIAGNOSIS — F419 Anxiety disorder, unspecified: Secondary | ICD-10-CM | POA: Diagnosis not present

## 2022-07-09 DIAGNOSIS — M545 Low back pain, unspecified: Secondary | ICD-10-CM | POA: Diagnosis not present

## 2022-07-09 DIAGNOSIS — G8929 Other chronic pain: Secondary | ICD-10-CM | POA: Diagnosis not present

## 2022-07-09 MED ORDER — HYDROCODONE-ACETAMINOPHEN 10-325 MG PO TABS
0.5000 | ORAL_TABLET | Freq: Four times a day (QID) | ORAL | 0 refills | Status: DC
  Filled 2022-07-11: qty 120, 30d supply, fill #0

## 2022-07-09 MED ORDER — DICLOFENAC SODIUM 1 % EX GEL
2.0000 g | Freq: Four times a day (QID) | CUTANEOUS | 1 refills | Status: DC | PRN
  Filled 2022-07-09: qty 600, 30d supply, fill #0

## 2022-07-09 MED ORDER — FENTANYL 25 MCG/HR TD PT72
1.0000 | MEDICATED_PATCH | TRANSDERMAL | 0 refills | Status: DC
  Filled 2022-07-11: qty 10, 30d supply, fill #0

## 2022-07-10 DIAGNOSIS — R590 Localized enlarged lymph nodes: Secondary | ICD-10-CM | POA: Diagnosis not present

## 2022-07-11 ENCOUNTER — Other Ambulatory Visit (HOSPITAL_BASED_OUTPATIENT_CLINIC_OR_DEPARTMENT_OTHER): Payer: Self-pay

## 2022-07-13 ENCOUNTER — Ambulatory Visit (HOSPITAL_COMMUNITY)
Admission: EM | Admit: 2022-07-13 | Discharge: 2022-07-13 | Disposition: A | Discharge status: Home / Self Care | Payer: Medicare Other | Attending: Emergency Medicine | Admitting: Emergency Medicine

## 2022-07-13 ENCOUNTER — Encounter (HOSPITAL_COMMUNITY): Payer: Self-pay

## 2022-07-13 DIAGNOSIS — N390 Urinary tract infection, site not specified: Secondary | ICD-10-CM | POA: Diagnosis not present

## 2022-07-13 LAB — POCT URINALYSIS DIP (MANUAL ENTRY)
Glucose, UA: 250 mg/dL — AB
Nitrite, UA: POSITIVE — AB
Protein Ur, POC: >=300 mg/dL — AB
Spec Grav, UA: <=1.005 — AB (ref 1.010–1.025)
Urobilinogen, UA: >=8 E.U./dL — AB
pH, UA: 5 (ref 5.0–8.0)

## 2022-07-13 MED ORDER — CIPROFLOXACIN HCL 500 MG PO TABS: 500.0000 mg | ORAL_TABLET | Freq: Two times a day (BID) | ORAL | 0 refills | Status: AC

## 2022-07-13 NOTE — ED Triage Notes (Signed)
Patient states she was seen on 07/03/22 and was prescribed antibiotics for a UTI which she completed 4-5 days ago. Patient states her symptoms never resolved. Patient c/o urinary urgency, slight hematuria, and dysuria.

## 2022-07-13 NOTE — ED Provider Notes (Signed)
[facility]    CSN: [id number] Arrival date & time: 07/13/22  1020      History   Chief Complaint Chief Complaint  Patient presents with   Urinary Urgency   Dysuria    HPI Kayla [name] is a 77 y.o. female.   77 year old female, Kayla [name], presents to urgent care with complaint of urinary urgency, hematuria, and dysuria, seen on 06/19 and treated with keflex for UTI, did not improve. Pt VS normal, is afebrile in urgent care T 98.1. Pt denies nausea, vomiting, or back pain  The history is provided by the patient. No language interpreter was used.    Past Medical History:  Diagnosis Date   Adjustment disorder with depressed mood 03/18/2007   Allergy    Anal fissure 10/08/2016   Angina    takes Propanolol prn and it stops her chest   Anorexia nervosa 11/11/2019   Barrett esophagus    not consistently present   Cataract    removed  but had retinal detachment - repeat cataract right eye   Cervical facet joint syndrome 10/08/2016   Chronic constipation 10/04/2010   Chronic interstitial cystitis 10/16/2011   Chronic kidney disease    kidney stones and UTI   Chronic maxillary sinusitis 09/15/2013   Chronic narcotic dependence 10/03/2018   Complication of anesthesia    either  BP or pulse dropped with last surgery   Degeneration of cervical intervertebral disc 03/18/2007   Degeneration of lumbar or lumbosacral intervertebral disc 03/18/2007   Diaphragmatic hernia 03/18/2007   Dysrhythmia    brought on by stress   Epigastric pain 11/11/2019   Esophageal dysphagia 10/03/2018   External hemorrhoids    Fibromyalgia    neuropathy knees ankles and toes, bladder   Generalized anxiety disorder 03/18/2007   GERD (gastroesophageal reflux disease) 03/18/2007   HA (headache)-chronic/tension type 07/28/2012   Heart murmur    Hiatal hernia    History of anemia    History of gastric bypass 02/11/2014   1999 at [facility]   History of hyperparathyroidism  01/22/2016   History of kidney stones    Hypertension    Hypoglycemia 11/08/2016   IBS (irritable bowel syndrome)    Internal hemorrhoids    LLQ abdominal pain 11/11/2019   Macular degeneration    Malnutrition 11/11/2019   Mechanical breakdown implanted electronic neurostimulator spinal cord 11/16/2018   Mitral regurgitation 12/11/2015   MVP (mitral valve prolapse) 12/17/2017   Myalgia 03/18/2007   Nephrolithiasis 07/28/2012   lithotrip 2013-Eskridge,MD   Neuropathic pain 01/23/2018   Orthostatic hypotension 11/20/2016   Osteoarthritis    all over   Osteopetrosis    Osteoporosis 07/28/2012   Peripheral vascular disease    superficial phlebitis in left calf   Personal history of colonic polyps 07/22/2013   07/2013 - 3 small adenomas - repeat colonoscopy 2018   PTSD (post-traumatic stress disorder)    Rosacea 11/02/2017   Small intestinal bacterial overgrowth 08/05/2017   + lactulose breath test 06/2017 Xifaxan Rx   Type II diabetes mellitus 11/18/2016    Patient Active Problem List   Diagnosis Date Noted   Recurrent UTI 07/13/2022   Iron deficiency anemia due to dietary causes 04/18/2021   Peripheral neuropathy 04/03/2021   Chronic kidney disease 03/22/2021   Macular degeneration 03/22/2021   PTSD (post-traumatic stress disorder)    Epigastric pain 11/11/2019   Anorexia nervosa 11/11/2019   LLQ abdominal pain 11/11/2019   Malnutrition 11/11/2019   Mechanical breakdown  implanted electronic neurostimulator spinal cord 11/16/2018   Esophageal dysphagia 10/03/2018   Neuropathic pain 01/23/2018   MVP (mitral valve prolapse) 12/17/2017   Rosacea 11/02/2017   Small intestinal bacterial overgrowth 08/05/2017   Orthostatic hypotension 11/20/2016   Type II diabetes mellitus 11/18/2016   Hypoglycemia 11/08/2016   Cervical facet joint syndrome 10/08/2016   Anal fissure 10/08/2016   Peripheral vascular disease 01/22/2016   Hypertension 01/22/2016   History of  hyperparathyroidism 01/22/2016   Mitral regurgitation 12/11/2015   History of gastric bypass 02/11/2014   Chronic maxillary sinusitis 09/15/2013   Personal history of colonic polyps 07/22/2013   HA (headache)-chronic/tension type 07/28/2012   Osteoporosis 07/28/2012   Nephrolithiasis 07/28/2012   Fibromyalgia 07/26/2012   Chronic interstitial cystitis 10/16/2011   Chronic constipation 10/04/2010   Loss of weight 11/27/2009   Generalized anxiety disorder 03/18/2007   Adjustment disorder with depressed mood 03/18/2007   GERD (gastroesophageal reflux disease) 03/18/2007   Diaphragmatic hernia 03/18/2007   Osteoarthritis 03/18/2007   Degeneration of cervical intervertebral disc 03/18/2007   Degeneration of lumbar or lumbosacral intervertebral disc 03/18/2007   Myalgia 03/18/2007    Past Surgical History:  Procedure Laterality Date   ABDOMINAL HYSTERECTOMY     APPENDECTOMY     CARDIAC CATHETERIZATION  03/22/1991   EF 61%   CARDIOVASCULAR STRESS TEST  10/03/2006   CERVICAL SPINE SURGERY  02/14/2022   BLOCK   CHOLECYSTECTOMY     COLONOSCOPY  06/23/2008   normal   DILATION AND CURETTAGE OF UTERUS     x2   ESOPHAGUS SURGERY     EXPLORATORY LAPAROTOMY  1993   EYE SURGERY     GASTRIC BYPASS  1999   GASTRIC RESTRICTION SURGERY  1991   LASIK Bilateral    Right Arm Surgery     Right Knee Arthroscopy     Rt wrist fx  2009   spinal surgery battery implant     stomach stappeling  1991   STONE EXTRACTION WITH BASKET  2012   TONSILLECTOMY     TUBAL LIGATION     UPPER GASTROINTESTINAL ENDOSCOPY  06/23/2008   w/Dil, Barrett's esophagus   US ECHOCARDIOGRAPHY  01/21/2007   EF 55-60%   US ECHOCARDIOGRAPHY  08/30/2003   EF 55-60%    OB History   No obstetric history on file.      Home Medications    Prior to Admission medications   Medication Sig Start Date End Date Taking? Authorizing Provider  ciprofloxacin (CIPRO) 500 MG tablet Take 1 tablet (500 mg total) by mouth 2  (two) times daily for 7 days. 07/13/22 07/20/22 Yes Viola Placeres, Para March, NP  aspirin-acetaminophen-caffeine (EXCEDRIN MIGRAINE) [phone] MG tablet Take 2 tablets by mouth 2 (two) times daily.    [provider]  Biotin 1000 MCG CHEW Chew 1 tablet by mouth daily.    [provider]  butalbital-acetaminophen-caffeine (FIORICET) 50-325-40 MG tablet Take 1 tablet by mouth every 6 (six) hours as needed for headache. 05/24/22   Drema Dallas, DO  clonazePAM (KLONOPIN) 0.5 MG tablet Take 0.25 mg by mouth as needed.    [provider]  COLLAGEN PO Take by mouth daily.    [provider]  Continuous Blood Gluc Receiver (DEXCOM G7 RECEIVER) DEVI by Does not apply route.    [provider]  Copper Gluconate POWD Take by mouth.    [provider]  cyclobenzaprine (FLEXERIL) 10 MG tablet Take by mouth. Patient not taking: Reported on 06/12/2022 11/17/14  [provider]  cycloSPORINE (RESTASIS) 0.05 % ophthalmic emulsion Place 1 drop into both eyes 2 (two) times daily. Patient not taking: Reported on 06/12/2022 11/08/16   Sherren Mocha, MD  dicyclomine (BENTYL) 10 MG capsule Take 1-2 tab 3 times daily AC as needed for spasms and cramping. 11/10/19   Zehr, Princella Pellegrini, PA-C  Docusate Sodium (DSS) 100 MG CAPS Take by mouth.    [provider]  Erenumab-aooe 70 MG/ML SOAJ Inject into the skin. 08/13/17   [provider]  famotidine (PEPCID) 20 MG tablet Take by mouth.    [provider]  fentaNYL (DURAGESIC) 25 MCG/HR Place 1 patch onto the skin every 3 (three) days. 07/09/22     furosemide (LASIX) 40 MG tablet Take by mouth. Patient not taking: Reported on 06/12/2022 02/12/17   [provider]  Galcanezumab-gnlm (EMGALITY) 120 MG/ML SOAJ Inject 120 mg into the skin every 28 (twenty-eight) days. 03/16/19   Everlena Cooper, Adam R, DO  glucagon 1 MG injection Inject 1 mg into the muscle once as needed for up to 1 dose. 05/12/19   Romero Belling, MD  HYDROcodone-acetaminophen [facility]) 10-325 MG tablet Take 0.5-1 tablets by mouth 4 (four) times daily as needed. Patient taking differently: Take 1 tablet by mouth 4 (four) times daily as needed. Taking three times a day as needed 06/11/22     HYDROcodone-acetaminophen (NORCO) 10-325 MG tablet Take 0.5-1 tablets by mouth 4 (four) times daily if needed for pain. Maximum 4 tablets per day. 07/09/22     hydrocortisone cream 1 % Apply topically.    [provider]  hypromellose (GENTEAL) 0.3 % GEL ophthalmic ointment Place 1 drop into both eyes at bedtime. Reported on 02/11/2015    [provider]  latanoprost (XALATAN) 0.005 % ophthalmic solution Apply to eye.    [provider]  lidocaine (LIDODERM) 5 % Place 1 patch onto the skin as needed. Remove & Discard patch within 12 hours. May dispense as 3 month supply 10/20/18   Doristine Bosworth, MD  Lifitegrast 5 % SOLN Place 1 drop into both eyes daily.    [provider]  magnesium hydroxide (MILK OF MAGNESIA) 400 MG/5ML suspension Take by mouth. 10/30/21   [provider]  metroNIDAZOLE (METROGEL) 1 % gel Apply topically daily. 10/20/18   Doristine Bosworth, MD  mirabegron ER (MYRBETRIQ) 25 MG TB24 tablet Take 1 tablet (25 mg total) by mouth daily. 10/20/18   Doristine Bosworth, MD  naloxegol oxalate (MOVANTIK) 25 MG TABS tablet Take 25 mg by mouth daily.    [provider]  Naloxone HCl 0.4 MG/0.4ML SOAJ Administer 0.4mg  [place] at first sign of opioid overdose and repeat every 2 minutes as needed for resuscitation. Call 911 immediately 03/20/16   Sherren Mocha, MD  NON [facility]  Peripheral Neuropathy Cream- Bupivacaine 1%, Doxepin 3%, Gabapentin 6%, Pentoxifylline 3%, Topiramate 1% Apply 1-2 grams to affected area 3-4 times daily Qty. 120 gm 3 refills    [provider]  NON FORMULARY GABA6%LIDO5%DICL3%    [provider]  nystatin-triamcinolone (MYCOLOG II) cream   05/03/19   [provider]  ondansetron (ZOFRAN) 8 MG tablet Take 1 tablet (8 mg total) by mouth every 8 (eight) hours as needed for nausea or vomiting. 10/20/18   Doristine Bosworth, MD  phenazopyridine (PYRIDIUM) 95 MG tablet Take by mouth. 12/15/18   [provider]  polyethylene glycol (MIRALAX / GLYCOLAX) 17 g packet Take by  mouth. 10/30/21 10/30/22  [provider]  potassium chloride SA (KLOR-CON M) 20 MEQ tablet Take by mouth.    [provider]  pregabalin (LYRICA) 50 MG capsule Take 1 capsule in morning and 2 capsules at night 11/28/21   Shon Millet R, DO  Probiotic Product (PROBIOTIC-10 PO) Take by mouth.     [provider]  senna-docusate (SENOKOT-S) 8.6-50 MG tablet Take by mouth. 10/30/21 10/30/22  [provider]  sucralfate (CARAFATE) 1 GM/10ML suspension Take 10 mLs (1 g total) by mouth 4 (four) times daily. 11/10/19   Zehr, Princella Pellegrini, PA-C  SUMAtriptan (IMITREX) 100 MG tablet Take 1 tablet at earliest onset of headache, may repeat in 2 hours if headache persists or reoccurs. 12/13/19   Drema Dallas, DO  Vitamin D, Ergocalciferol, (DRISDOL) 1.25 MG (50000 UNIT) CAPS capsule Take 1 capsule (50,000 Units total) by mouth every 7 (seven) days. 03/20/22   Pollyann Savoy, MD    Family History Family History  Problem Relation Age of Onset   Stroke Mother    Lung cancer Mother    Heart disease Mother    Diabetes Mother    Hypertension Father    Stroke Father    Heart disease Father    Kidney disease Father    Seizures Father        epilepsy, and sisters x 2   Memory loss Father    Lung cancer Sister    Breast cancer Sister    Cancer - Lung Sister    Heart disease Sister    Diabetes Sister    Pancreatic cancer Sister    Healthy Daughter    Healthy Son    Colon cancer Maternal Aunt        12 relatives   Colon cancer Paternal Aunt    Colon cancer Paternal Aunt    Uterine cancer Other        aunt   Heart disease Other         grandmother   Esophageal cancer Neg Hx    Rectal cancer Neg Hx    Stomach cancer Neg Hx    Colon polyps Neg Hx     Social History Social History   Tobacco Use   Smoking status: Never    Passive exposure: Past   Smokeless tobacco: Never  Vaping Use   Vaping Use: Never used  Substance Use Topics   Alcohol use: No   Drug use: No     Allergies   Ambien [zolpidem tartrate], Codeine, Oxycodone-acetaminophen, Tylox [oxycodone-acetaminophen], Zolpidem tartrate, Darvocet [propoxyphene n-acetaminophen], Darvon [propoxyphene], Lorcet [hydrocodone-acetaminophen], Opium, Vicodin [hydrocodone-acetaminophen], Wheat, Amoxicillin, and Penicillins   Review of Systems Review of Systems  Genitourinary:  Positive for dysuria, frequency, hematuria and urgency.  All other systems reviewed and are negative.    Physical Exam Triage Vital Signs ED Triage Vitals  Enc Vitals Group     BP      Pulse      Resp      Temp      Temp src      SpO2      Weight      Height      Head Circumference      Peak Flow      Pain Score      Pain Loc      Pain Edu?      Excl. in GC?    No data found.  Updated Vital Signs BP 108/68 (BP Location: Right Arm)  Pulse 94   Temp 98.1 F (36.7 C) (Oral)   Resp 16   SpO2 94%   Visual Acuity Right Eye Distance:   Left Eye Distance:   Bilateral Distance:    Right Eye Near:   Left Eye Near:    Bilateral Near:     Physical Exam Vitals and nursing note reviewed.  Constitutional:      General: She is not in acute distress.    Appearance: She is well-developed and well-groomed.  HENT:     Head: Normocephalic and atraumatic.  Eyes:     Conjunctiva/sclera: Conjunctivae normal.  Cardiovascular:     Rate and Rhythm: Normal rate.     Heart sounds: No murmur heard. Pulmonary:     Effort: Pulmonary effort is normal. No respiratory distress.  Abdominal:     Palpations: Abdomen is soft.     Tenderness: There is no abdominal tenderness.   Musculoskeletal:        General: No swelling.     Cervical back: Neck supple.     Comments: -CVAT bilaterally  Skin:    General: Skin is warm and dry.     Capillary Refill: Capillary refill takes less than 2 seconds.  Neurological:     General: No focal deficit present.     Mental Status: She is alert and oriented to person, place, and time.     GCS: GCS eye subscore is 4. GCS verbal subscore is 5. GCS motor subscore is 6.  Psychiatric:        Attention and Perception: Attention normal.        Mood and Affect: Mood normal.        Speech: Speech normal.        Behavior: Behavior normal. Behavior is cooperative.      UC Treatments / Results  Labs (all labs ordered are listed, but only abnormal results are displayed) Labs Reviewed  POCT URINALYSIS DIP (MANUAL ENTRY) - Abnormal; Notable for the following components:      Result Value   Color, UA red (*)    Clarity, UA cloudy (*)    Glucose, UA =250 (*)    Bilirubin, UA large (*)    Ketones, POC UA small (15) (*)    Spec Grav, UA <=1.005 (*)    Blood, UA moderate (*)    Protein Ur, POC >=300 (*)    Urobilinogen, UA >=8.0 (*)    Nitrite, UA Positive (*)    Leukocytes, UA Large (3+) (*)    All other components within normal limits  URINE CULTURE    EKG   Radiology No results found.  Procedures Procedures (including critical care time)  Medications Ordered in UC Medications - No data to display  Initial Impression / Assessment and Plan / UC Course  I have reviewed the triage vital signs and the nursing notes.  Pertinent labs & imaging results that were available during my care of the patient were reviewed by me and considered in my medical decision making (see chart for details).    Discussed exam findings,results and plan of care, pt verbalized understanding to this provider, strict return to ER precautions given.  Ddx: Recurrent UTI, kidney stone, urosepsis  Final Clinical Impressions(s) / UC Diagnoses    Final diagnoses:  Recurrent UTI     Discharge Instructions      Drink plenty of water. Rest,push fluids, take antibiotic (Cipro) as directed. If you develop fever, nausea,vomiting,unable to keep meds down, back pain,muscle aches will  need to go immediately to the ER for further evaluation: which may include lab work,imaging,IV fluids/meds. If you continue to have recurrent uti's you will need to see a urologist, may need to be referred by PCP. Please follow up with PCP after completing antibiotic to make sure your urine is negative.      ED Prescriptions     Medication Sig Dispense Auth. Provider   ciprofloxacin (CIPRO) 500 MG tablet Take 1 tablet (500 mg total) by mouth 2 (two) times daily for 7 days. 14 tablet Tryson Lumley, Para March, NP      PDMP not reviewed this encounter.   Clancy Gourd, NP 07/13/22 1130

## 2022-07-13 NOTE — Discharge Instructions (Addendum)
Drink plenty of water. Rest,push fluids, take antibiotic (Cipro) as directed. If you develop fever, nausea,vomiting,unable to keep meds down, back pain,muscle aches will need to go immediately to the ER for further evaluation: which may include lab work,imaging,IV fluids/meds. If you continue to have recurrent uti's you will need to see a urologist, may need to be referred by PCP. Please follow up with PCP after completing antibiotic to make sure your urine is negative.

## 2022-07-15 ENCOUNTER — Telehealth: Payer: Self-pay | Admitting: *Deleted

## 2022-07-15 LAB — URINE CULTURE: Culture: 40000 — AB

## 2022-07-15 NOTE — Telephone Encounter (Signed)
Patient contacted the office and left message requesting a refill on Vitamin D. Attempted to contact the patient and left message to advise patient we will need to recheck her vitamin d level prior to sending in a prescription.

## 2022-07-25 DIAGNOSIS — R350 Frequency of micturition: Secondary | ICD-10-CM | POA: Diagnosis not present

## 2022-07-25 DIAGNOSIS — N3941 Urge incontinence: Secondary | ICD-10-CM | POA: Diagnosis not present

## 2022-07-25 DIAGNOSIS — R351 Nocturia: Secondary | ICD-10-CM | POA: Diagnosis not present

## 2022-07-26 ENCOUNTER — Telehealth: Payer: Self-pay | Admitting: Neurology

## 2022-07-26 NOTE — Telephone Encounter (Signed)
Per patient wanted to know if she could have another Rescue medication then the sumatriptan since she has taken so long and she feels like it's not helping any more.    Per Dr.Jaffe,She can come in for a headache cocktail on Monday.  Otherwise, we can send in a prednisone taper but my concern is that she is a diabetic and she will have to closely monitor her sugars

## 2022-07-26 NOTE — Telephone Encounter (Signed)
Patient states that she is still having headache and medication is not working and she would like to speak to someone about what she can do to help with this or what Everlena Cooper would like to do

## 2022-07-26 NOTE — Telephone Encounter (Signed)
How frequent or the headaches (on average, how many days a week/month are they occurring)?  *** How long do the headaches last?  On going 12 days with pressure top of head and forehead.  Verify what preventative medication and dose you are taking (e.g. topiramate, propranolol, amitriptyline, Emgality, etc)  Emgality,  Verify which rescue medication you are taking (triptan, Advil, Excedrin, Aleve, Ubrelvy, etc)  Sumatriptan,Fiorect How often are you taking pain relievers/analgesics/rescue mediction?        LMOVM for patient to call us back.

## 2022-07-29 NOTE — Telephone Encounter (Signed)
Per Dr.jaffe,  You send in prescription for rizatriptan 10mg  (take as needed.  May repeat after 2 hours.  Maximum 2 tablets in 24 hours).  But this won't break a migraine that has been ongoing for days and it cannot be taken within 24 hours of the sumatriptan.  Should still limit use of pain relievers (NSAIDs, Tylenol, sumatriptan, rizatriptan) to no more than 2 days out of the wee    LMOVm to call the office.

## 2022-07-30 DIAGNOSIS — K912 Postsurgical malabsorption, not elsewhere classified: Secondary | ICD-10-CM | POA: Diagnosis not present

## 2022-07-30 DIAGNOSIS — R634 Abnormal weight loss: Secondary | ICD-10-CM | POA: Diagnosis not present

## 2022-07-30 DIAGNOSIS — R7309 Other abnormal glucose: Secondary | ICD-10-CM | POA: Diagnosis not present

## 2022-07-30 MED ORDER — RIZATRIPTAN BENZOATE 10 MG PO TBDP: 10.0000 mg | ORAL_TABLET | ORAL | 5 refills | Status: DC | PRN

## 2022-07-30 NOTE — Telephone Encounter (Signed)
Patient advised of Dr.Jaffe note, Rizatriptan called in.

## 2022-08-01 ENCOUNTER — Ambulatory Visit (INDEPENDENT_AMBULATORY_CARE_PROVIDER_SITE_OTHER): Payer: Medicare Other | Admitting: Podiatry

## 2022-08-01 DIAGNOSIS — B351 Tinea unguium: Secondary | ICD-10-CM

## 2022-08-01 DIAGNOSIS — L600 Ingrowing nail: Secondary | ICD-10-CM

## 2022-08-01 DIAGNOSIS — L603 Nail dystrophy: Secondary | ICD-10-CM | POA: Diagnosis not present

## 2022-08-01 DIAGNOSIS — L608 Other nail disorders: Secondary | ICD-10-CM | POA: Diagnosis not present

## 2022-08-01 NOTE — Progress Notes (Unsigned)
Subjective:   Patient ID: Kayla [name], female   DOB: 77 y.o.   MRN: [id number]   HPI Chief Complaint  Patient presents with   Ingrown Toenail    Patient came in today for recurring ingrown toenails, hallux, started 2 years ago, patient denies any pain,  77 year old female presents the office today with above concerns.  She states that she is continuing hydrogen peroxide and she would use antibiotic ointment.  This has become a reoccurring issue.  Currently no drainage or pus.   Review of Systems  All other systems reviewed and are negative.  Past Medical History:  Diagnosis Date   Adjustment disorder with depressed mood 03/18/2007   Allergy    Anal fissure 10/08/2016   Angina    takes Propanolol prn and it stops her chest   Anorexia nervosa 11/11/2019   Barrett esophagus    not consistently present   Cataract    removed  but had retinal detachment - repeat cataract right eye   Cervical facet joint syndrome 10/08/2016   Chronic constipation 10/04/2010   Chronic interstitial cystitis 10/16/2011   Chronic kidney disease    kidney stones and UTI   Chronic maxillary sinusitis 09/15/2013   Chronic narcotic dependence 10/03/2018   Complication of anesthesia    either  BP or pulse dropped with last surgery   Degeneration of cervical intervertebral disc 03/18/2007   Degeneration of lumbar or lumbosacral intervertebral disc 03/18/2007   Diaphragmatic hernia 03/18/2007   Dysrhythmia    brought on by stress   Epigastric pain 11/11/2019   Esophageal dysphagia 10/03/2018   External hemorrhoids    Fibromyalgia    neuropathy knees ankles and toes, bladder   Generalized anxiety disorder 03/18/2007   GERD (gastroesophageal reflux disease) 03/18/2007   HA (headache)-chronic/tension type 07/28/2012   Heart murmur    Hiatal hernia    History of anemia    History of gastric bypass 02/11/2014   1999 at [facility]   History of hyperparathyroidism 01/22/2016   History of kidney  stones    Hypertension    Hypoglycemia 11/08/2016   IBS (irritable bowel syndrome)    Internal hemorrhoids    LLQ abdominal pain 11/11/2019   Macular degeneration    Malnutrition 11/11/2019   Mechanical breakdown implanted electronic neurostimulator spinal cord 11/16/2018   Mitral regurgitation 12/11/2015   MVP (mitral valve prolapse) 12/17/2017   Myalgia 03/18/2007   Nephrolithiasis 07/28/2012   lithotrip 2013-[place],MD   Neuropathic pain 01/23/2018   Orthostatic hypotension 11/20/2016   Osteoarthritis    all over   Osteopetrosis    Osteoporosis 07/28/2012   Peripheral vascular disease    superficial phlebitis in left calf   Personal history of colonic polyps 07/22/2013   07/2013 - 3 small adenomas - repeat colonoscopy 2018   PTSD (post-traumatic stress disorder)    Rosacea 11/02/2017   Small intestinal bacterial overgrowth 08/05/2017   + lactulose breath test 06/2017 Xifaxan Rx   Type II diabetes mellitus 11/18/2016    Past Surgical History:  Procedure Laterality Date   ABDOMINAL HYSTERECTOMY     APPENDECTOMY     CARDIAC CATHETERIZATION  03/22/1991   EF 61%   CARDIOVASCULAR STRESS TEST  10/03/2006   CERVICAL SPINE SURGERY  02/14/2022   BLOCK   CHOLECYSTECTOMY     COLONOSCOPY  06/23/2008   normal   DILATION AND CURETTAGE OF UTERUS     x2   ESOPHAGUS SURGERY     EXPLORATORY LAPAROTOMY  1993  EYE SURGERY     GASTRIC BYPASS  1999   GASTRIC RESTRICTION SURGERY  1991   LASIK Bilateral    Right Arm Surgery     Right Knee Arthroscopy     Rt wrist fx  2009   spinal surgery battery implant     stomach stappeling  1991   STONE EXTRACTION WITH BASKET  2012   TONSILLECTOMY     TUBAL LIGATION     UPPER GASTROINTESTINAL ENDOSCOPY  06/23/2008   w/Dil, Barrett's esophagus   US ECHOCARDIOGRAPHY  01/21/2007   EF 55-60%   US ECHOCARDIOGRAPHY  08/30/2003   EF 55-60%     Current Outpatient Medications:    cyclobenzaprine (FLEXERIL) 10 MG tablet, Take by mouth., Disp:  , Rfl:    cycloSPORINE (RESTASIS) 0.05 % ophthalmic emulsion, Place 1 drop into both eyes 2 (two) times daily., Disp: 5.5 mL, Rfl: 5   furosemide (LASIX) 40 MG tablet, Take by mouth., Disp: , Rfl:    aspirin-acetaminophen-caffeine (EXCEDRIN MIGRAINE) 250-250-65 MG tablet, Take 2 tablets by mouth 2 (two) times daily., Disp: , Rfl:    Biotin 1000 MCG CHEW, Chew 1 tablet by mouth daily., Disp: , Rfl:    butalbital-acetaminophen-caffeine (FIORICET) 50-325-40 MG tablet, Take 1 tablet by mouth every 6 (six) hours as needed for headache., Disp: 10 tablet, Rfl: 2   clonazePAM (KLONOPIN) 0.5 MG tablet, Take 0.25 mg by mouth as needed., Disp: , Rfl:    COLLAGEN PO, Take by mouth daily., Disp: , Rfl:    Continuous Blood Gluc Receiver (DEXCOM G7 RECEIVER) DEVI, by Does not apply route., Disp: , Rfl:    Copper Gluconate POWD, Take by mouth., Disp: , Rfl:    dicyclomine (BENTYL) 10 MG capsule, Take 1-2 tab 3 times daily AC as needed for spasms and cramping., Disp: 270 capsule, Rfl: 1   Docusate Sodium (DSS) 100 MG CAPS, Take by mouth., Disp: , Rfl:    Erenumab-aooe 70 MG/ML SOAJ, Inject into the skin., Disp: , Rfl:    famotidine (PEPCID) 20 MG tablet, Take by mouth., Disp: , Rfl:    fentaNYL (DURAGESIC) 25 MCG/HR, Place 1 patch onto the skin every 3 (three) days., Disp: 10 patch, Rfl: 0   Galcanezumab-gnlm (EMGALITY) 120 MG/ML SOAJ, Inject 120 mg into the skin every 28 (twenty-eight) days., Disp: 3 pen, Rfl: 3   glucagon 1 MG injection, Inject 1 mg into the muscle once as needed for up to 1 dose., Disp: 1 each, Rfl: 12   HYDROcodone-acetaminophen (NORCO) 10-325 MG tablet, Take 0.5-1 tablets by mouth 4 (four) times daily as needed. (Patient taking differently: Take 1 tablet by mouth 4 (four) times daily as needed. Taking three times a day as needed), Disp: 120 tablet, Rfl: 0   HYDROcodone-acetaminophen (NORCO) 10-325 MG tablet, Take 0.5-1 tablets by mouth 4 (four) times daily if needed for pain. Maximum 4 tablets  per day., Disp: 120 tablet, Rfl: 0   hydrocortisone cream 1 %, Apply topically., Disp: , Rfl:    hypromellose (GENTEAL) 0.3 % GEL ophthalmic ointment, Place 1 drop into both eyes at bedtime. Reported on 02/11/2015, Disp: , Rfl:    latanoprost (XALATAN) 0.005 % ophthalmic solution, Apply to eye., Disp: , Rfl:    lidocaine (LIDODERM) 5 %, Place 1 patch onto the skin as needed. Remove & Discard patch within 12 hours. May dispense as 3 month supply, Disp: 90 patch, Rfl: 3   Lifitegrast 5 % SOLN, Place 1 drop into both eyes daily., Disp: ,  Rfl:    magnesium hydroxide (MILK OF MAGNESIA) 400 MG/5ML suspension, Take by mouth., Disp: , Rfl:    metroNIDAZOLE (METROGEL) 1 % gel, Apply topically daily., Disp: 45 g, Rfl: 0   mirabegron ER (MYRBETRIQ) 25 MG TB24 tablet, Take 1 tablet (25 mg total) by mouth daily., Disp: 90 tablet, Rfl: 1   naloxegol oxalate (MOVANTIK) 25 MG TABS tablet, Take 25 mg by mouth daily., Disp: , Rfl:    Naloxone HCl 0.4 MG/0.4ML SOAJ, Administer 0.4mg  [place] at first sign of opioid overdose and repeat every 2 minutes as needed for resuscitation. Call 911 immediately, Disp: 5 Package, Rfl: 0   NON FORMULARY, [facility]  Peripheral Neuropathy Cream- Bupivacaine 1%, Doxepin 3%, Gabapentin 6%, Pentoxifylline 3%, Topiramate 1% Apply 1-2 grams to affected area 3-4 times daily Qty. 120 gm 3 refills, Disp: , Rfl:    NON FORMULARY, GABA6%LIDO5%DICL3%, Disp: , Rfl:    nystatin-triamcinolone (MYCOLOG II) cream, , Disp: , Rfl:    ondansetron (ZOFRAN) 8 MG tablet, Take 1 tablet (8 mg total) by mouth every 8 (eight) hours as needed for nausea or vomiting., Disp: 60 tablet, Rfl: 0   phenazopyridine (PYRIDIUM) 95 MG tablet, Take by mouth., Disp: , Rfl:    polyethylene glycol (MIRALAX / GLYCOLAX) 17 g packet, Take by mouth., Disp: , Rfl:    potassium chloride SA (KLOR-CON M) 20 MEQ tablet, Take by mouth., Disp: , Rfl:    pregabalin (LYRICA) 50 MG capsule, Take 1 capsule in morning and 2 capsules at  night, Disp: 90 capsule, Rfl: 5   Probiotic Product (PROBIOTIC-10 PO), Take by mouth. , Disp: , Rfl:    rizatriptan (MAXALT-MLT) 10 MG disintegrating tablet, Take 1 tablet (10 mg total) by mouth as needed for migraine. May repeat in 2 hours if needed, Disp: 9 tablet, Rfl: 5   senna-docusate (SENOKOT-S) 8.6-50 MG tablet, Take by mouth., Disp: , Rfl:    sucralfate (CARAFATE) 1 GM/10ML suspension, Take 10 mLs (1 g total) by mouth 4 (four) times daily., Disp: 420 mL, Rfl: 1   SUMAtriptan (IMITREX) 100 MG tablet, Take 1 tablet at earliest onset of headache, may repeat in 2 hours if headache persists or reoccurs., Disp: 30 tablet, Rfl: 1   Vitamin D, Ergocalciferol, (DRISDOL) 1.25 MG (50000 UNIT) CAPS capsule, Take 1 capsule (50,000 Units total) by mouth every 7 (seven) days., Disp: 12 capsule, Rfl: 0  Allergies  Allergen Reactions   Ambien [Zolpidem Tartrate]     Hallucinations   Codeine Anaphylaxis   Oxycodone-Acetaminophen Shortness Of Breath   Tylox [Oxycodone-Acetaminophen] Anaphylaxis    Chest pain   Zolpidem Tartrate Anaphylaxis and Other (See Comments)    Other Reaction(s): Hallucination  Other reaction(s): Other, hallucinations, Other reaction(s): Other, hallucinations, hallucinations   Darvocet [Propoxyphene N-Acetaminophen]     Chest pain   Darvon [Propoxyphene] Itching   Lorcet [Hydrocodone-Acetaminophen]     Chest pain   Opium     hallucinations   Vicodin [Hydrocodone-Acetaminophen] Other (See Comments)    Chest Pain    Wheat    Amoxicillin Nausea And Vomiting    Reaction:  Migraine headache   Penicillins Nausea And Vomiting    Migraine Headaches           Objective:  Physical Exam  General: AAO x3, NAD  Dermatological: Incurvation present bilateral hallux toenails localized edema there is no cellulitis present.  There is no drainage or pus.  No fluctuation or crepitation.  No malodor.  No open lesion.  Nails  dystrophic we will discoloration and hypertrophic as  well.  Vascular: Dorsalis Pedis artery and Posterior Tibial artery pedal pulses are 2/4 bilateral with immedate capillary fill time.  There is no pain with calf compression, swelling, warmth, erythema.   Neruologic: Grossly intact via light touch bilateral.  Musculoskeletal: Tenderness in toenail, no other areas of discomfort.        Assessment:   Ingrown toenail     Plan:  -Treatment options discussed including all alternatives, risks, and complications -Etiology of symptoms were discussed -We discussed possibility but she went to hold off on this today.  Debrided the nails but any complications or bleeding.  Recommend Epsom salt soaks, antibiotic ointment dressing changes daily.  Symptoms persist we will proceed with partial nail avulsion once she is able to have this done.  -Nail sent for culture   Vivi Barrack DPM

## 2022-08-06 ENCOUNTER — Telehealth: Payer: Self-pay | Admitting: Podiatry

## 2022-08-06 NOTE — Telephone Encounter (Signed)
[facility] (PCP) )called to inform her Podiatrist that's she no longer a pt of theirs.

## 2022-08-07 ENCOUNTER — Other Ambulatory Visit: Payer: Self-pay | Admitting: *Deleted

## 2022-08-07 DIAGNOSIS — E559 Vitamin D deficiency, unspecified: Secondary | ICD-10-CM

## 2022-08-07 DIAGNOSIS — N3000 Acute cystitis without hematuria: Secondary | ICD-10-CM | POA: Diagnosis not present

## 2022-08-07 MED ORDER — VITAMIN D (ERGOCALCIFEROL) 1.25 MG (50000 UNIT) PO CAPS: 50000.0000 [IU] | ORAL_CAPSULE | ORAL | 0 refills | Status: DC

## 2022-08-07 NOTE — Telephone Encounter (Signed)
Last Fill: 03/20/2022  Next Visit: 08/28/2022  Last Visit: 03/20/2022  Dx: Vitamin D deficiency   Lab:  07/30/2022 [facility] Everywhere   25 OH Vitamin D Specimen: Blood Component Ref Range & Units 8 d ago  Vitamin D Total, 25OH 30 - 100 ng/ml 20 Low     Current Dose per office note on 03/20/2022: vitamin D 50,000 units once a week for the next 3 months and then we will repeat vitamin D level.   Okay to refill Vitamin D ?

## 2022-08-08 ENCOUNTER — Other Ambulatory Visit (HOSPITAL_BASED_OUTPATIENT_CLINIC_OR_DEPARTMENT_OTHER): Payer: Self-pay

## 2022-08-08 DIAGNOSIS — M7062 Trochanteric bursitis, left hip: Secondary | ICD-10-CM | POA: Diagnosis not present

## 2022-08-08 DIAGNOSIS — F32A Depression, unspecified: Secondary | ICD-10-CM | POA: Diagnosis not present

## 2022-08-08 DIAGNOSIS — F419 Anxiety disorder, unspecified: Secondary | ICD-10-CM | POA: Diagnosis not present

## 2022-08-08 DIAGNOSIS — M545 Low back pain, unspecified: Secondary | ICD-10-CM | POA: Diagnosis not present

## 2022-08-08 DIAGNOSIS — G609 Hereditary and idiopathic neuropathy, unspecified: Secondary | ICD-10-CM | POA: Diagnosis not present

## 2022-08-08 DIAGNOSIS — M542 Cervicalgia: Secondary | ICD-10-CM | POA: Diagnosis not present

## 2022-08-08 DIAGNOSIS — G894 Chronic pain syndrome: Secondary | ICD-10-CM | POA: Diagnosis not present

## 2022-08-08 MED ORDER — DICLOFENAC SODIUM 1 % EX GEL
Freq: Four times a day (QID) | CUTANEOUS | 1 refills | Status: AC | PRN
  Filled 2022-08-08: qty 600, 30d supply, fill #0

## 2022-08-08 MED ORDER — HYDROCODONE-ACETAMINOPHEN 10-325 MG PO TABS
ORAL_TABLET | Freq: Four times a day (QID) | ORAL | 0 refills | Status: DC | PRN
  Filled 2022-08-08: qty 120, 30d supply, fill #0

## 2022-08-08 MED ORDER — FENTANYL 25 MCG/HR TD PT72
1.0000 | MEDICATED_PATCH | TRANSDERMAL | 0 refills | Status: DC
  Filled 2022-08-08: qty 10, 30d supply, fill #0

## 2022-08-13 ENCOUNTER — Encounter: Payer: Self-pay | Admitting: Podiatry

## 2022-08-13 NOTE — Progress Notes (Unsigned)
NEUROLOGY FOLLOW UP OFFICE NOTE  Kayla [name] [id number]  Assessment/Plan:   Migraine without aura, without status migrainosus, not intractable Idiopathic polyneuropathy Chronic pain syndrome    For neuralgia:  Lyrica 50mg  in am and 100mg  in pm.  Continue Cymbalta 30mg  twice daily.   Migraine prevention:  Emgality Migraine rescue:  rizatriptan 10mg .  Fioricet sparingly. Limit use of pain relievers to no more than 2 days out of week to prevent risk of rebound or medication-overuse headache. Keep headache diary Follow up 6 months.     Subjective:  Kayla [name] is a 77 year old woman with fibromyalgia, type 2 diabetes, anxiety with history of PTSD, degenerative disc disease of cervical and lumbar spine, and history of nephrolithiasis and gastric bypass who follows up for polyneuropathy, migraine   UPDATE: Migraine: Headaches no longer responding to sumatriptan.  Switched to rizatriptan which helps.  Migraines are improved since stopping the TPN. Duration:  within 5 minutes with rizatriptan Frequency:  once to twice a week.   Neuropathy/Chronic pain Taking Lyrica 50mg  in AM and 100mg  in PM (higher doses caused cognitive clouding). Fentanyl patch, Norco.  No longer getting nerve blocks (ineffective)      Current NSAIDS:  flurbiprofen; Celebrex Current analgesics:  Excedrin Migraine, Lidoderm, voltaren 1%, Fentanyl patch Current triptans:  rizatriptan 10mg  Current ergotamine:  no Current anti-emetic:  Zofran 8mg  Current muscle relaxants:  Flexeril 10mg   Current anti-anxiolytic:  clonazepam Current Antihypertensive medications:  Lasix Current Antidepressant medications:  Cymbalta 30mg  BID Current Anticonvulsant medications:  Lyrica 50mg  in AM and 100mg  at night, Current anti-CGRP:  Emgality Current Vitamins/Herbal/Supplements:  turmeric Current Antihistamines/Decongestants:  no    Caffeine:  2 cups of coffee daily Alcohol:  no Smoker:  no Diet:  poor Exercise:   no Depression:  no; Anxiety:  yes Sleep hygiene:  poor Chronic pain syndrome:  Followed by pain management.  I have her on Lyrica.  She is prescribed Cymbalta by another provider.   HISTORY: I  Neuropathy:  Since July 2018, she has had increased swelling and burning numbness and tingling in the legs.  She had lower extremity vascular ABIs on 08/20/16, which was negative.  She has history of B12 deficiency but she takes injections and recent B12 level from 09/21/16 was over 2000.  Methylmalonic acid level was 209, RPR nonreactive, homocysteine 7.8.  Labs from 07/12/16 include normal SPEP, Sed Rate 2, CRP less than 0.3, and TSH 2.430 .  Vitamin D was 23 and she was advised to start supplementation.  Serum glucose has been 70s up to 110.  She also takes B6.  She also has history of weight loss.  She has fibromyalgia and was previously diagnosed with neuropathy by another neurologist.  She has a spinal cord stimulator.  She has been on gabapentin for many years.  It was briefly discontinued to see if it was contributing to the swelling.  She reported no significant change, so it was restarted (she takes 300mg  4 times daily), although she thinks it helped a little bit.  NCV-EMG from 10/21/16 demonstrated chronic sensorimotor polyneuropathy of the predominantly axonal type as well as mild left median neuropathy at or distal to the wrist.  Other labs include B1 146.1, B6 10.7, Sed Rate 2, HIV negative, TSH 1.780, UPEP negative.  She went to [facility] for evaluation of neuropathy.  Selenium, Zinc, paraneoplastic panel and genetic testing Copper was low, so she was advised to start supplement.  B6 was still elevated despite stopping supplements  3 prior.     She was wondering if her neuropathy could be secondary to [employer] exposure.  Her husband was a [place] veteran and reports cases of spouses exposed to [employer] through their husband's semen.  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  Her  neuropathic symptoms started shortly after her return.       II Migraine: Onset:  Since 1970, after her twin newborns passed away.  She has lived with life-long family-related stress Location:  Migraines are unilateral/parietal or bilateral retro-orbital, chronic tension type (bifrontal) Quality:  Severe crushing Initial Intensity:  10/10 Aura:  no Prodrome:  no Associated symptoms:  Photophobia, phonophobia.  No nausea, vomiting or visual disturbance Initial Duration:  Migraines 1 hour with sumatriptan, other headaches 15 minutes with Fioricet Initial Frequency:  Daily (3 to 4 days a month are severe migraine) Triggers/exacerbating factors:  Stress, Nexium Relieving factors:  Clonazepam, Fioricet, sumatriptan Activity:  Cannot function when severe (3-4 days per month)   Past NSAIDS:  Ibuprofen, naproxen Past analgesics:  Cafergot, Excedrin, Tylenol, Tramadol, Fioricet Past abortive triptans:  sumatriptan tab Past muscle relaxants:  tizanidine, Flexeril Past anti-emetic:  Zofran ODT 8mg  Past antihypertensive medications:  Lopressor, propranolol Past antidepressant medications:  Elavil, Effexor, Cymbalta, Zoloft Past anticonvulsant medications:  Topiramate, gabapentin 300mg  twice daily Past CGRP-inhibitor:  Aimovig 140mg  Past vitamins/Herbal/Supplements:  CoQ10 Other past therapies:  yoga, physical therapy   Family history of headache:  Father, sister, brother  Memory Complaints She mentioned trouble with short term memory.  Neuropsychological evaluation on 03/23/2021 was within normal limits and likely he memory complaints are related to PTSD, chronic pain and poor sleep.     MRI and MRA of brain from 07/23/11 were personally reviewed and unremarkable.  PAST MEDICAL HISTORY: Past Medical History:  Diagnosis Date   Adjustment disorder with depressed mood 03/18/2007   Allergy    Anal fissure 10/08/2016   Angina    takes Propanolol prn and it stops her chest   Anorexia nervosa  11/11/2019   Barrett esophagus    not consistently present   Cataract    removed  but had retinal detachment - repeat cataract right eye   Cervical facet joint syndrome 10/08/2016   Chronic constipation 10/04/2010   Chronic interstitial cystitis 10/16/2011   Chronic kidney disease    kidney stones and UTI   Chronic maxillary sinusitis 09/15/2013   Chronic narcotic dependence 10/03/2018   Complication of anesthesia    either  BP or pulse dropped with last surgery   Degeneration of cervical intervertebral disc 03/18/2007   Degeneration of lumbar or lumbosacral intervertebral disc 03/18/2007   Diaphragmatic hernia 03/18/2007   Dysrhythmia    brought on by stress   Epigastric pain 11/11/2019   Esophageal dysphagia 10/03/2018   External hemorrhoids    Fibromyalgia    neuropathy knees ankles and toes, bladder   Generalized anxiety disorder 03/18/2007   GERD (gastroesophageal reflux disease) 03/18/2007   HA (headache)-chronic/tension type 07/28/2012   Heart murmur    Hiatal hernia    History of anemia    History of gastric bypass 02/11/2014   [facility] at [facility]   History of hyperparathyroidism 01/22/2016   History of kidney stones    Hypertension    Hypoglycemia 11/08/2016   IBS (irritable bowel syndrome)    Internal hemorrhoids    LLQ abdominal pain 11/11/2019   Macular degeneration    Malnutrition 11/11/2019   Mechanical breakdown implanted electronic neurostimulator  spinal cord 11/16/2018   Mitral regurgitation 12/11/2015   MVP (mitral valve prolapse) 12/17/2017   Myalgia 03/18/2007   Nephrolithiasis 07/28/2012   lithotrip 2013-[place],MD   Neuropathic pain 01/23/2018   Orthostatic hypotension 11/20/2016   Osteoarthritis    all over   Osteopetrosis    Osteoporosis 07/28/2012   Peripheral vascular disease    superficial phlebitis in left calf   Personal history of colonic polyps 07/22/2013   07/2013 - 3 small adenomas - repeat colonoscopy 2018   PTSD  (post-traumatic stress disorder)    Rosacea 11/02/2017   Small intestinal bacterial overgrowth 08/05/2017   + lactulose breath test 06/2017 Xifaxan Rx   Type II diabetes mellitus 11/18/2016    MEDICATIONS: Current Outpatient Medications on File Prior to Visit  Medication Sig Dispense Refill   aspirin-acetaminophen-caffeine (EXCEDRIN MIGRAINE) 250-250-65 MG tablet Take 2 tablets by mouth 2 (two) times daily.     Biotin 1000 MCG CHEW Chew 1 tablet by mouth daily.     butalbital-acetaminophen-caffeine (FIORICET) 50-325-40 MG tablet Take 1 tablet by mouth every 6 (six) hours as needed for headache. 10 tablet 2   clonazePAM (KLONOPIN) 0.5 MG tablet Take 0.25 mg by mouth as needed.     COLLAGEN PO Take by mouth daily.     Continuous Blood Gluc Receiver (DEXCOM G7 RECEIVER) DEVI by Does not apply route.     Copper Gluconate POWD Take by mouth.     cyclobenzaprine (FLEXERIL) 10 MG tablet Take by mouth.     cycloSPORINE (RESTASIS) 0.05 % ophthalmic emulsion Place 1 drop into both eyes 2 (two) times daily. 5.5 mL 5   diclofenac Sodium (VOLTAREN) 1 % GEL Apply 2 to 4 gram to the affected area(s) (painful joints) up to 4 times daily as needed. 600 g 1   dicyclomine (BENTYL) 10 MG capsule Take 1-2 tab 3 times daily AC as needed for spasms and cramping. 270 capsule 1   Docusate Sodium (DSS) 100 MG CAPS Take by mouth.     Erenumab-aooe 70 MG/ML SOAJ Inject into the skin.     famotidine (PEPCID) 20 MG tablet Take by mouth.     fentaNYL (DURAGESIC) 25 MCG/HR Place 1 patch onto the skin every 3 (three) days. 10 patch 0   furosemide (LASIX) 40 MG tablet Take by mouth.     Galcanezumab-gnlm (EMGALITY) 120 MG/ML SOAJ Inject 120 mg into the skin every 28 (twenty-eight) days. 3 pen 3   glucagon 1 MG injection Inject 1 mg into the muscle once as needed for up to 1 dose. 1 each 12   HYDROcodone-acetaminophen (NORCO) 10-325 MG tablet Take 0.5-1 tablets by mouth 4 (four) times daily as needed. (Patient taking  differently: Take 1 tablet by mouth 4 (four) times daily as needed. Taking three times a day as needed) 120 tablet 0   HYDROcodone-acetaminophen (NORCO) 10-325 MG tablet Take 0.5-1 tablets by mouth 4 (four) times daily if needed for pain. Maximum 4 tablets per day. 120 tablet 0   HYDROcodone-acetaminophen (NORCO) 10-325 MG tablet Take 1/2 - 1 tablet by mouth 4 (four) times daily as needed for pain. Max Daily Dose: 4 tablets. 120 tablet 0   hydrocortisone cream 1 % Apply topically.     hypromellose (GENTEAL) 0.3 % GEL ophthalmic ointment Place 1 drop into both eyes at bedtime. Reported on 02/11/2015     latanoprost (XALATAN) 0.005 % ophthalmic solution Apply to eye.     lidocaine (LIDODERM) 5 % Place 1 patch onto the skin  as needed. Remove & Discard patch within 12 hours. May dispense as 3 month supply 90 patch 3   Lifitegrast 5 % SOLN Place 1 drop into both eyes daily.     magnesium hydroxide (MILK OF MAGNESIA) 400 MG/5ML suspension Take by mouth.     metroNIDAZOLE (METROGEL) 1 % gel Apply topically daily. 45 g 0   mirabegron ER (MYRBETRIQ) 25 MG TB24 tablet Take 1 tablet (25 mg total) by mouth daily. 90 tablet 1   naloxegol oxalate (MOVANTIK) 25 MG TABS tablet Take 25 mg by mouth daily.     Naloxone HCl 0.4 MG/0.4ML SOAJ Administer 0.4mg  [place] at first sign of opioid overdose and repeat every 2 minutes as needed for resuscitation. Call 911 immediately 5 Package 0   NON FORMULARY [facility]  Peripheral Neuropathy Cream- Bupivacaine 1%, Doxepin 3%, Gabapentin 6%, Pentoxifylline 3%, Topiramate 1% Apply 1-2 grams to affected area 3-4 times daily Qty. 120 gm 3 refills     NON FORMULARY GABA6%LIDO5%DICL3%     nystatin-triamcinolone (MYCOLOG II) cream      ondansetron (ZOFRAN) 8 MG tablet Take 1 tablet (8 mg total) by mouth every 8 (eight) hours as needed for nausea or vomiting. 60 tablet 0   phenazopyridine (PYRIDIUM) 95 MG tablet Take by mouth.     polyethylene glycol (MIRALAX / GLYCOLAX) 17 g  packet Take by mouth.     potassium chloride SA (KLOR-CON M) 20 MEQ tablet Take by mouth.     pregabalin (LYRICA) 50 MG capsule Take 1 capsule in morning and 2 capsules at night 90 capsule 5   Probiotic Product (PROBIOTIC-10 PO) Take by mouth.      rizatriptan (MAXALT-MLT) 10 MG disintegrating tablet Take 1 tablet (10 mg total) by mouth as needed for migraine. May repeat in 2 hours if needed 9 tablet 5   senna-docusate (SENOKOT-S) 8.6-50 MG tablet Take by mouth.     sucralfate (CARAFATE) 1 GM/10ML suspension Take 10 mLs (1 g total) by mouth 4 (four) times daily. 420 mL 1   SUMAtriptan (IMITREX) 100 MG tablet Take 1 tablet at earliest onset of headache, may repeat in 2 hours if headache persists or reoccurs. 30 tablet 1   Vitamin D, Ergocalciferol, (DRISDOL) 1.25 MG (50000 UNIT) CAPS capsule Take 1 capsule (50,000 Units total) by mouth every 7 (seven) days. 12 capsule 0   No current facility-administered medications on file prior to visit.    ALLERGIES: Allergies  Allergen Reactions   Ambien [Zolpidem Tartrate]     Hallucinations   Codeine Anaphylaxis   Oxycodone-Acetaminophen Shortness Of Breath   Tylox [Oxycodone-Acetaminophen] Anaphylaxis    Chest pain   Zolpidem Tartrate Anaphylaxis and Other (See Comments)    Other Reaction(s): Hallucination  Other reaction(s): Other, hallucinations, Other reaction(s): Other, hallucinations, hallucinations   Darvocet [Propoxyphene N-Acetaminophen]     Chest pain   Darvon [Propoxyphene] Itching   Lorcet [Hydrocodone-Acetaminophen]     Chest pain   Opium     hallucinations   Vicodin [Hydrocodone-Acetaminophen] Other (See Comments)    Chest Pain    Wheat    Amoxicillin Nausea And Vomiting    Reaction:  Migraine headache   Penicillins Nausea And Vomiting    Migraine Headaches    FAMILY HISTORY: Family History  Problem Relation Age of Onset   Stroke Mother    Lung cancer Mother    Heart disease Mother    Diabetes Mother    Hypertension  Father    Stroke Father  Heart disease Father    Kidney disease Father    Seizures Father        epilepsy, and sisters x 2   Memory loss Father    Lung cancer Sister    Breast cancer Sister    Cancer - Lung Sister    Heart disease Sister    Diabetes Sister    Pancreatic cancer Sister    Healthy Daughter    Healthy Son    Colon cancer Maternal Aunt        12 relatives   Colon cancer Paternal Aunt    Colon cancer Paternal Aunt    Uterine cancer Other        aunt   Heart disease Other        grandmother   Esophageal cancer Neg Hx    Rectal cancer Neg Hx    Stomach cancer Neg Hx    Colon polyps Neg Hx       Objective:  Blood pressure 110/69, pulse 79, height 5\' 5"  (1.651 m), weight 124 lb (56.2 kg), SpO2 98%. General: No acute distress.  Patient appears well-groomed.     Shon Millet, DO

## 2022-08-14 ENCOUNTER — Encounter: Payer: Self-pay | Admitting: Neurology

## 2022-08-14 ENCOUNTER — Ambulatory Visit (INDEPENDENT_AMBULATORY_CARE_PROVIDER_SITE_OTHER): Payer: Medicare Other | Admitting: Neurology

## 2022-08-14 VITALS — BP 110/69 | HR 79 | Ht 65.0 in | Wt 124.0 lb

## 2022-08-14 DIAGNOSIS — G43009 Migraine without aura, not intractable, without status migrainosus: Secondary | ICD-10-CM | POA: Diagnosis not present

## 2022-08-14 DIAGNOSIS — G609 Hereditary and idiopathic neuropathy, unspecified: Secondary | ICD-10-CM

## 2022-08-14 DIAGNOSIS — G894 Chronic pain syndrome: Secondary | ICD-10-CM

## 2022-08-14 MED ORDER — DULOXETINE HCL 30 MG PO CPEP: 30.0000 mg | ORAL_CAPSULE | Freq: Two times a day (BID) | ORAL | 3 refills | Status: DC

## 2022-08-14 MED ORDER — PREGABALIN 50 MG PO CAPS: ORAL_CAPSULE | ORAL | 3 refills | Status: DC

## 2022-08-14 MED ORDER — RIZATRIPTAN BENZOATE 10 MG PO TBDP: 10.0000 mg | ORAL_TABLET | ORAL | 3 refills | Status: DC | PRN

## 2022-08-14 MED ORDER — EMGALITY 120 MG/ML ~~LOC~~ SOAJ: 120.0000 mg | SUBCUTANEOUS | 3 refills | Status: DC

## 2022-08-14 NOTE — Patient Instructions (Signed)
Refilled Emgality Lyrica Duloxetine Rizatriptan

## 2022-08-15 NOTE — Progress Notes (Signed)
Office Visit Note  Patient: Kayla [name]             Date of Birth: [birth year]           MRN: [id number]             PCP: [facility] Referring: [facility] Visit Date: 08/28/2022 Occupation: @GUAROCC @  Subjective:  Pain in hands   History of Present Illness: Kayla [name] is a 77 y.o. female with osteoarthritis, degenerative disc disease, fibromyalgia and osteoporosis.  She has been getting Prolia injections on a 6 monthly basis.  Last Prolia injection was in March 2024.  She has been taking calcium and vitamin D.  She states she is on vitamin D prescription currently.  She states when she does canning and uses her hands she has locking sensation in her fingers.  She also has been experiencing increased pain and discomfort in her neck and lower back.  As she has noticed a knot on her right thumb.  She has been going to pain management and continues to have generalized pain and discomfort despite being on medications.  She had to discontinue Celebrex due to GI side effects.    Activities of Daily Living:  Patient reports morning stiffness for 24 hours.   Patient Reports nocturnal pain.  Difficulty dressing/grooming: Denies Difficulty climbing stairs: Reports Difficulty getting out of chair: Reports Difficulty using hands for taps, buttons, cutlery, and/or writing: Denies  Review of Systems  Constitutional:  Positive for fatigue.  HENT:  Positive for mouth dryness. Negative for mouth sores.   Eyes:  Positive for dryness.  Respiratory:  Negative for shortness of breath.   Cardiovascular:  Negative for palpitations.  Gastrointestinal:  Positive for constipation and nausea. Negative for blood in stool and diarrhea.  Endocrine: Negative for increased urination.  Genitourinary:  Positive for involuntary urination.  Musculoskeletal:  Positive for joint pain, gait problem, joint pain, myalgias, muscle weakness, morning stiffness, muscle tenderness and  myalgias. Negative for joint swelling.  Skin:  Positive for hair loss. Negative for color change, rash and sensitivity to sunlight.  Allergic/Immunologic: Negative for susceptible to infections.  Neurological:  Positive for headaches. Negative for dizziness.  Hematological:  Negative for swollen glands.  Psychiatric/Behavioral:  Positive for sleep disturbance. Negative for depressed mood. The patient is nervous/anxious.     PMFS History:  Patient Active Problem List   Diagnosis Date Noted   Recurrent UTI 07/13/2022   Iron deficiency anemia due to dietary causes 04/18/2021   Peripheral neuropathy 04/03/2021   Chronic kidney disease 03/22/2021   Macular degeneration 03/22/2021   PTSD (post-traumatic stress disorder)    Epigastric pain 11/11/2019   Anorexia nervosa 11/11/2019   LLQ abdominal pain 11/11/2019   Malnutrition 11/11/2019   Mechanical breakdown implanted electronic neurostimulator spinal cord 11/16/2018   Esophageal dysphagia 10/03/2018   Neuropathic pain 01/23/2018   MVP (mitral valve prolapse) 12/17/2017   Rosacea 11/02/2017   Small intestinal bacterial overgrowth 08/05/2017   Orthostatic hypotension 11/20/2016   Type II diabetes mellitus 11/18/2016   Hypoglycemia 11/08/2016   Cervical facet joint syndrome 10/08/2016   Anal fissure 10/08/2016   Peripheral vascular disease 01/22/2016   Hypertension 01/22/2016   History of hyperparathyroidism 01/22/2016   Mitral regurgitation 12/11/2015   History of gastric bypass 02/11/2014   Chronic maxillary sinusitis 09/15/2013   Personal history of colonic polyps 07/22/2013   HA (headache)-chronic/tension type 07/28/2012   Osteoporosis 07/28/2012   Nephrolithiasis 07/28/2012   Fibromyalgia  07/26/2012   Chronic interstitial cystitis 10/16/2011   Chronic constipation 10/04/2010   Loss of weight 11/27/2009   Generalized anxiety disorder 03/18/2007   Adjustment disorder with depressed mood 03/18/2007   GERD (gastroesophageal  reflux disease) 03/18/2007   Diaphragmatic hernia 03/18/2007   Osteoarthritis 03/18/2007   Degeneration of cervical intervertebral disc 03/18/2007   Degeneration of lumbar or lumbosacral intervertebral disc 03/18/2007   Myalgia 03/18/2007    Past Medical History:  Diagnosis Date   Adjustment disorder with depressed mood 03/18/2007   Allergy    Anal fissure 10/08/2016   Angina    takes Propanolol prn and it stops her chest   Anorexia nervosa 11/11/2019   Barrett esophagus    not consistently present   Cataract    removed  but had retinal detachment - repeat cataract right eye   Cervical facet joint syndrome 10/08/2016   Chronic constipation 10/04/2010   Chronic interstitial cystitis 10/16/2011   Chronic kidney disease    kidney stones and UTI   Chronic maxillary sinusitis 09/15/2013   Chronic narcotic dependence 10/03/2018   Complication of anesthesia    either  BP or pulse dropped with last surgery   Degeneration of cervical intervertebral disc 03/18/2007   Degeneration of lumbar or lumbosacral intervertebral disc 03/18/2007   Diaphragmatic hernia 03/18/2007   Dysrhythmia    brought on by stress   Epigastric pain 11/11/2019   Esophageal dysphagia 10/03/2018   External hemorrhoids    Fibromyalgia    neuropathy knees ankles and toes, bladder   Generalized anxiety disorder 03/18/2007   GERD (gastroesophageal reflux disease) 03/18/2007   HA (headache)-chronic/tension type 07/28/2012   Heart murmur    Hiatal hernia    History of anemia    History of gastric bypass 02/11/2014   1999 at [facility]   History of hyperparathyroidism 01/22/2016   History of kidney stones    Hypertension    Hypoglycemia 11/08/2016   IBS (irritable bowel syndrome)    Internal hemorrhoids    LLQ abdominal pain 11/11/2019   Macular degeneration    Malnutrition 11/11/2019   Mechanical breakdown implanted electronic neurostimulator spinal cord 11/16/2018   Mitral regurgitation 12/11/2015    MVP (mitral valve prolapse) 12/17/2017   Myalgia 03/18/2007   Nephrolithiasis 07/28/2012   lithotrip 2013-Eskridge,MD   Neuropathic pain 01/23/2018   Orthostatic hypotension 11/20/2016   Osteoarthritis    all over   Osteopetrosis    Osteoporosis 07/28/2012   Peripheral vascular disease    superficial phlebitis in left calf   Personal history of colonic polyps 07/22/2013   07/2013 - 3 small adenomas - repeat colonoscopy 2018   PTSD (post-traumatic stress disorder)    Rosacea 11/02/2017   Small intestinal bacterial overgrowth 08/05/2017   + lactulose breath test 06/2017 Xifaxan Rx   Type II diabetes mellitus 11/18/2016   Urinary tract infection     Family History  Problem Relation Age of Onset   Stroke Mother    Lung cancer Mother    Heart disease Mother    Diabetes Mother    Hypertension Father    Stroke Father    Heart disease Father    Kidney disease Father    Seizures Father        epilepsy, and sisters x 2   Memory loss Father    Lung cancer Sister    Breast cancer Sister    Cancer - Lung Sister    Heart disease Sister    Diabetes Sister  Pancreatic cancer Sister    Healthy Daughter    Healthy Son    Colon cancer Maternal Aunt        12 relatives   Colon cancer Paternal Aunt    Colon cancer Paternal Aunt    Uterine cancer Other        aunt   Heart disease Other        grandmother   Esophageal cancer Neg Hx    Rectal cancer Neg Hx    Stomach cancer Neg Hx    Colon polyps Neg Hx    Past Surgical History:  Procedure Laterality Date   ABDOMINAL HYSTERECTOMY     APPENDECTOMY     CARDIAC CATHETERIZATION  03/22/1991   EF 61%   CARDIOVASCULAR STRESS TEST  10/03/2006   CERVICAL SPINE SURGERY  02/14/2022   BLOCK   CHOLECYSTECTOMY     COLONOSCOPY  06/23/2008   normal   DILATION AND CURETTAGE OF UTERUS     x2   ESOPHAGUS SURGERY     EXPLORATORY LAPAROTOMY  1993   EYE SURGERY     GASTRIC BYPASS  1999   GASTRIC RESTRICTION SURGERY  1991   LASIK Bilateral     Right Arm Surgery     Right Knee Arthroscopy     Rt wrist fx  2009   spinal surgery battery implant     stomach stappeling  1991   STONE EXTRACTION WITH BASKET  2012   TONSILLECTOMY     TUBAL LIGATION     UPPER GASTROINTESTINAL ENDOSCOPY  06/23/2008   w/Dil, Barrett's esophagus   US ECHOCARDIOGRAPHY  01/21/2007   EF 55-60%   US ECHOCARDIOGRAPHY  08/30/2003   EF 55-60%   Social History   Social History Narrative   Right handed   Two story home   She retired on disability   Married   2 Children   Immunization History  Administered Date(s) Administered   Hepatitis B, ADULT 04/21/2017   Influenza Split 10/02/2011   Influenza Whole 08/21/2007   Influenza, High Dose Seasonal PF 12/29/2017   Influenza,inj,Quad PF,6+ Mos 12/15/2013, 11/23/2014, 09/27/2015, 10/04/2016   Influenza-Unspecified 12/15/2013, 11/23/2014, 09/27/2015, 10/04/2016   PFIZER(Purple Top)SARS-COV-2 Vaccination 03/31/2019, 04/20/2019   Pneumococcal Conjugate-13 09/05/2014   Pneumococcal Polysaccharide-23 10/02/2011, 02/08/2013   Td 01/14/2006   Tdap 01/22/2016     Objective: Vital Signs: BP 101/65 (BP Location: Left Arm, Patient Position: Sitting, Cuff Size: Normal)   Pulse 94   Resp 15   Ht 5\' 5"  (1.651 m)   Wt 120 lb (54.4 kg)   BMI 19.97 kg/m    Physical Exam Vitals and nursing note reviewed.  Constitutional:      Appearance: She is well-developed.  HENT:     Head: Normocephalic and atraumatic.  Eyes:     Conjunctiva/sclera: Conjunctivae normal.  Cardiovascular:     Rate and Rhythm: Normal rate and regular rhythm.     Heart sounds: Normal heart sounds.  Pulmonary:     Effort: Pulmonary effort is normal.     Breath sounds: Normal breath sounds.  Abdominal:     General: Bowel sounds are normal.     Palpations: Abdomen is soft.  Musculoskeletal:     Cervical back: Normal range of motion.  Lymphadenopathy:     Cervical: No cervical adenopathy.  Skin:    General: Skin is warm and dry.      Capillary Refill: Capillary refill takes less than 2 seconds.  Neurological:     Mental Status:  She is alert and oriented to person, place, and time.  Psychiatric:        Behavior: Behavior normal.      Musculoskeletal Exam: She had limited range of motion of the cervical spine.  She had good mobility in the lumbar spine with discomfort.  Shoulder joints, elbow joints, wrist joints, MCPs PIPs and DIPs were in good range of motion.  She had bilateral PIP and DIP thickening.  Hip joints and knee joints with good range of motion without any warmth swelling or effusion.  There was no tenderness over ankles or MTPs.  She had generalized hyperalgesia and positive tender points.  She had tenderness over bilateral trochanteric bursa and trapezius region.  CDAI Exam: CDAI Score: -- Patient Global: --; Provider Global: -- Swollen: --; Tender: -- Joint Exam 08/28/2022   No joint exam has been documented for this visit   There is currently no information documented on the homunculus. Go to the Rheumatology activity and complete the homunculus joint exam.  Investigation: No additional findings.  Imaging: No results found.  Recent Labs: Lab Results  Component Value Date   WBC 7.6 06/12/2022   HGB 12.3 06/12/2022   PLT 267 06/12/2022   NA 141 06/12/2022   K 4.8 06/12/2022   CL 110 06/12/2022   CO2 28 06/12/2022   GLUCOSE 102 (H) 06/12/2022   BUN 23 06/12/2022   CREATININE 0.60 06/12/2022   BILITOT 0.3 06/12/2022   ALKPHOS 66 06/12/2022   AST 30 06/12/2022   ALT 24 06/12/2022   PROT 5.3 (L) 06/12/2022   ALBUMIN 3.3 (L) 06/12/2022   CALCIUM 8.2 (L) 06/12/2022   GFRAA 95 05/05/2020    Speciality Comments: Fosamax 2013-2018 Patient declined Tymlos and Forteo-travels frequently. Prolia May 26, 2020, January 26, 2021  Procedures:  No procedures performed Allergies: Ambien [zolpidem tartrate], Codeine, Oxycodone-acetaminophen, Tylox [oxycodone-acetaminophen], Zolpidem tartrate,  Celebrex [celecoxib], Darvocet [propoxyphene n-acetaminophen], Darvon [propoxyphene], Lorcet [hydrocodone-acetaminophen], Opium, Vicodin [hydrocodone-acetaminophen], Wheat, Amoxicillin, and Penicillins   Assessment / Plan:     Visit Diagnoses: Age-related osteoporosis without current pathological fracture - DEXA on 01/11/20: BMD measured at AP spine L1-L4 is 0.723 with a T-score of -3.8. -Patient states her PCP left and she has not had repeat DEXA scan.  She is trying to establish with a new PCP.  Will schedule DEXA scan.  She has been taking calcium and vitamin D.  Will check vitamin D level today.  Need for regular exercise was emphasized.  Plan: DG Bone Density  Vitamin D deficiency -she is currently on vitamin D supplement.  Vitamin D was low at 28 on March 19, 2022.  Plan: VITAMIN D 25 Hydroxy (Vit-D Deficiency, Fractures)  Medication management - Current therapy: Prolia injections: 01/26/2021,05/26/20, 01/26/2021, 08/03/2021, 03/29/2022. -I will obtain labs today.  Will schedule next Prolia injection in September.  Plan: COMPLETE METABOLIC PANEL WITH GFR, CBC with Differential/Platelet  DDD (degenerative disc disease), cervical-she continues to have some stiffness in her cervical spine.  She had limited range of motion.  She is followed by pain management.  Trapezius muscle spasm-neck muscle strengthening exercises were discussed.  DDD (degenerative disc disease), lumbar -she can use to have lower back pain.  She is on multiple medications through pain management.  She is followed by pain management.  Trochanteric bursitis of both hips-she has intermittent discomfort in the IT band.  Trochanteric bursitis stretches were demonstrated.  Fibromyalgia-she can history of generalized pain and discomfort.  She has positive tender points.  Need for  regular exercise and stretching was discussed.  Chronic pain syndrome - followed at pain management.  She uses fentanyl patches, and hydrocodone for pain  relief.  She is also taking Lyrica as prescribed.  Patient states she had to discontinue Celebrex due to GI side effects.  Muscle cramps-benefits of magnesium were discussed.  Other medical problems listed as follows:  Anxiety and depression  Other fatigue  IC (interstitial cystitis)  Hypoglycemia  History of gastroesophageal reflux (GERD)  Orders: Orders Placed This Encounter  Procedures   DG Bone Density   COMPLETE METABOLIC PANEL WITH GFR   VITAMIN D 25 Hydroxy (Vit-D Deficiency, Fractures)   CBC with Differential/Platelet   No orders of the defined types were placed in this encounter.    Follow-Up Instructions: Return in about 6 months (around 02/28/2023) for Osteoarthritis, Osteoporosis.   Pollyann Savoy, MD  Note - This record has been created using Animal nutritionist.  Chart creation errors have been sought, but may not always  have been located. Such creation errors do not reflect on  the standard of medical care.

## 2022-08-21 DIAGNOSIS — N3020 Other chronic cystitis without hematuria: Secondary | ICD-10-CM | POA: Diagnosis not present

## 2022-08-21 DIAGNOSIS — R350 Frequency of micturition: Secondary | ICD-10-CM | POA: Diagnosis not present

## 2022-08-21 DIAGNOSIS — R351 Nocturia: Secondary | ICD-10-CM | POA: Diagnosis not present

## 2022-08-21 DIAGNOSIS — N3941 Urge incontinence: Secondary | ICD-10-CM | POA: Diagnosis not present

## 2022-08-22 ENCOUNTER — Ambulatory Visit: Admitting: Rheumatology

## 2022-08-28 ENCOUNTER — Ambulatory Visit: Payer: Medicare Other | Attending: Rheumatology | Admitting: Rheumatology

## 2022-08-28 ENCOUNTER — Encounter: Payer: Self-pay | Admitting: Rheumatology

## 2022-08-28 VITALS — BP 101/65 | HR 94 | Resp 15 | Ht 65.0 in | Wt 120.0 lb

## 2022-08-28 DIAGNOSIS — N3010 Interstitial cystitis (chronic) without hematuria: Secondary | ICD-10-CM | POA: Diagnosis not present

## 2022-08-28 DIAGNOSIS — M5030 Other cervical disc degeneration, unspecified cervical region: Secondary | ICD-10-CM | POA: Insufficient documentation

## 2022-08-28 DIAGNOSIS — R252 Cramp and spasm: Secondary | ICD-10-CM | POA: Insufficient documentation

## 2022-08-28 DIAGNOSIS — M7061 Trochanteric bursitis, right hip: Secondary | ICD-10-CM | POA: Diagnosis not present

## 2022-08-28 DIAGNOSIS — M62838 Other muscle spasm: Secondary | ICD-10-CM | POA: Insufficient documentation

## 2022-08-28 DIAGNOSIS — R5383 Other fatigue: Secondary | ICD-10-CM | POA: Diagnosis not present

## 2022-08-28 DIAGNOSIS — F419 Anxiety disorder, unspecified: Secondary | ICD-10-CM | POA: Insufficient documentation

## 2022-08-28 DIAGNOSIS — Z79899 Other long term (current) drug therapy: Secondary | ICD-10-CM | POA: Insufficient documentation

## 2022-08-28 DIAGNOSIS — G894 Chronic pain syndrome: Secondary | ICD-10-CM | POA: Insufficient documentation

## 2022-08-28 DIAGNOSIS — M5136 Other intervertebral disc degeneration, lumbar region: Secondary | ICD-10-CM | POA: Insufficient documentation

## 2022-08-28 DIAGNOSIS — M797 Fibromyalgia: Secondary | ICD-10-CM | POA: Insufficient documentation

## 2022-08-28 DIAGNOSIS — E162 Hypoglycemia, unspecified: Secondary | ICD-10-CM | POA: Insufficient documentation

## 2022-08-28 DIAGNOSIS — M7062 Trochanteric bursitis, left hip: Secondary | ICD-10-CM | POA: Insufficient documentation

## 2022-08-28 DIAGNOSIS — M810 Age-related osteoporosis without current pathological fracture: Secondary | ICD-10-CM | POA: Insufficient documentation

## 2022-08-28 DIAGNOSIS — F32A Depression, unspecified: Secondary | ICD-10-CM | POA: Insufficient documentation

## 2022-08-28 DIAGNOSIS — Z8719 Personal history of other diseases of the digestive system: Secondary | ICD-10-CM | POA: Insufficient documentation

## 2022-08-28 DIAGNOSIS — E559 Vitamin D deficiency, unspecified: Secondary | ICD-10-CM | POA: Insufficient documentation

## 2022-08-29 LAB — CBC WITH DIFFERENTIAL/PLATELET
Absolute Monocytes: 438 {cells}/uL (ref 200–950)
Basophils Absolute: 42 {cells}/uL (ref 0–200)
Basophils Relative: 0.7 %
Eosinophils Absolute: 336 {cells}/uL (ref 15–500)
Eosinophils Relative: 5.6 %
HCT: 38.5 % (ref 35.0–45.0)
Hemoglobin: 12.6 g/dL (ref 11.7–15.5)
Lymphs Abs: 1824 {cells}/uL (ref 850–3900)
MCH: 31.6 pg (ref 27.0–33.0)
MCHC: 32.7 g/dL (ref 32.0–36.0)
MCV: 96.5 fL (ref 80.0–100.0)
MPV: 9.7 fL (ref 7.5–12.5)
Monocytes Relative: 7.3 %
Neutro Abs: 3360 {cells}/uL (ref 1500–7800)
Neutrophils Relative %: 56 %
Platelets: 262 10*3/uL (ref 140–400)
RBC: 3.99 10*6/uL (ref 3.80–5.10)
RDW: 11.8 % (ref 11.0–15.0)
Total Lymphocyte: 30.4 %
WBC: 6 10*3/uL (ref 3.8–10.8)

## 2022-08-29 LAB — COMPLETE METABOLIC PANEL WITH GFR
AG Ratio: 2.3 (calc) (ref 1.0–2.5)
ALT: 33 U/L — ABNORMAL HIGH (ref 6–29)
AST: 32 U/L (ref 10–35)
Albumin: 3.6 g/dL (ref 3.6–5.1)
Alkaline phosphatase (APISO): 59 U/L (ref 37–153)
BUN/Creatinine Ratio: 35 (calc) — ABNORMAL HIGH (ref 6–22)
BUN: 28 mg/dL — ABNORMAL HIGH (ref 7–25)
CO2: 30 mmol/L (ref 20–32)
Calcium: 9.4 mg/dL (ref 8.6–10.4)
Chloride: 106 mmol/L (ref 98–110)
Creat: 0.81 mg/dL (ref 0.60–1.00)
Globulin: 1.6 g/dL — ABNORMAL LOW (ref 1.9–3.7)
Glucose, Bld: 90 mg/dL (ref 65–99)
Potassium: 5.4 mmol/L — ABNORMAL HIGH (ref 3.5–5.3)
Sodium: 141 mmol/L (ref 135–146)
Total Bilirubin: 0.5 mg/dL (ref 0.2–1.2)
Total Protein: 5.2 g/dL — ABNORMAL LOW (ref 6.1–8.1)
eGFR: 76 mL/min/{1.73_m2} (ref >=60)

## 2022-08-29 LAB — VITAMIN D 25 HYDROXY (VIT D DEFICIENCY, FRACTURES): Vit D, 25-Hydroxy: 89 ng/mL (ref 30–100)

## 2022-08-29 NOTE — Progress Notes (Signed)
CBC is normal.  Potassium is mildly elevated probably hemolyzed sample.  Liver function is mildly elevated.  Patient should avoid NSAIDs and alcohol use.  Please forward results to her PCP.

## 2022-08-29 NOTE — Progress Notes (Signed)
Vitamin D is 56 which is in the higher limits of normal.  Patient can start over-the-counter vitamin D at 2000 units daily now.

## 2022-09-03 DIAGNOSIS — K2270 Barrett's esophagus without dysplasia: Secondary | ICD-10-CM | POA: Diagnosis not present

## 2022-09-03 DIAGNOSIS — N3000 Acute cystitis without hematuria: Secondary | ICD-10-CM | POA: Diagnosis not present

## 2022-09-03 DIAGNOSIS — G4700 Insomnia, unspecified: Secondary | ICD-10-CM | POA: Diagnosis not present

## 2022-09-10 ENCOUNTER — Other Ambulatory Visit (HOSPITAL_BASED_OUTPATIENT_CLINIC_OR_DEPARTMENT_OTHER): Payer: Self-pay

## 2022-09-10 DIAGNOSIS — F419 Anxiety disorder, unspecified: Secondary | ICD-10-CM | POA: Diagnosis not present

## 2022-09-10 DIAGNOSIS — M545 Low back pain, unspecified: Secondary | ICD-10-CM | POA: Diagnosis not present

## 2022-09-10 DIAGNOSIS — G8929 Other chronic pain: Secondary | ICD-10-CM | POA: Diagnosis not present

## 2022-09-10 DIAGNOSIS — F32A Depression, unspecified: Secondary | ICD-10-CM | POA: Diagnosis not present

## 2022-09-10 DIAGNOSIS — M7062 Trochanteric bursitis, left hip: Secondary | ICD-10-CM | POA: Diagnosis not present

## 2022-09-10 DIAGNOSIS — Z79899 Other long term (current) drug therapy: Secondary | ICD-10-CM | POA: Diagnosis not present

## 2022-09-10 DIAGNOSIS — M542 Cervicalgia: Secondary | ICD-10-CM | POA: Diagnosis not present

## 2022-09-10 DIAGNOSIS — G609 Hereditary and idiopathic neuropathy, unspecified: Secondary | ICD-10-CM | POA: Diagnosis not present

## 2022-09-10 MED ORDER — HYDROCODONE-ACETAMINOPHEN 10-325 MG PO TABS
ORAL_TABLET | ORAL | 0 refills | Status: DC
  Filled 2022-09-10: qty 120, 30d supply, fill #0

## 2022-09-10 MED ORDER — FENTANYL 25 MCG/HR TD PT72
MEDICATED_PATCH | TRANSDERMAL | 0 refills | Status: DC
  Filled 2022-09-10: qty 10, 30d supply, fill #0

## 2022-09-11 DIAGNOSIS — E162 Hypoglycemia, unspecified: Secondary | ICD-10-CM | POA: Diagnosis not present

## 2022-09-11 DIAGNOSIS — K912 Postsurgical malabsorption, not elsewhere classified: Secondary | ICD-10-CM | POA: Diagnosis not present

## 2022-09-12 DIAGNOSIS — Z79899 Other long term (current) drug therapy: Secondary | ICD-10-CM | POA: Diagnosis not present

## 2022-09-17 DIAGNOSIS — K2950 Unspecified chronic gastritis without bleeding: Secondary | ICD-10-CM | POA: Diagnosis not present

## 2022-09-17 DIAGNOSIS — N390 Urinary tract infection, site not specified: Secondary | ICD-10-CM | POA: Diagnosis not present

## 2022-09-19 DIAGNOSIS — I739 Peripheral vascular disease, unspecified: Secondary | ICD-10-CM | POA: Diagnosis not present

## 2022-09-19 DIAGNOSIS — Z9884 Bariatric surgery status: Secondary | ICD-10-CM | POA: Diagnosis not present

## 2022-09-19 DIAGNOSIS — R634 Abnormal weight loss: Secondary | ICD-10-CM | POA: Diagnosis not present

## 2022-09-19 DIAGNOSIS — Z8639 Personal history of other endocrine, nutritional and metabolic disease: Secondary | ICD-10-CM | POA: Diagnosis not present

## 2022-09-19 DIAGNOSIS — E43 Unspecified severe protein-calorie malnutrition: Secondary | ICD-10-CM | POA: Diagnosis not present

## 2022-09-19 DIAGNOSIS — E161 Other hypoglycemia: Secondary | ICD-10-CM | POA: Diagnosis not present

## 2022-09-23 DIAGNOSIS — F515 Nightmare disorder: Secondary | ICD-10-CM | POA: Diagnosis not present

## 2022-09-23 DIAGNOSIS — G8929 Other chronic pain: Secondary | ICD-10-CM | POA: Diagnosis not present

## 2022-09-23 DIAGNOSIS — F4312 Post-traumatic stress disorder, chronic: Secondary | ICD-10-CM | POA: Diagnosis not present

## 2022-09-26 ENCOUNTER — Encounter: Payer: Self-pay | Admitting: Pharmacist

## 2022-09-26 ENCOUNTER — Other Ambulatory Visit: Payer: Self-pay | Admitting: Pharmacist

## 2022-09-26 DIAGNOSIS — M810 Age-related osteoporosis without current pathological fracture: Secondary | ICD-10-CM

## 2022-09-26 NOTE — Progress Notes (Addendum)
Next Prolia SQ due on 09/25/22 Diagnosis: age-related osteoporosis  Dose: 60 mg SQ every 6 months  Last Clinic Visit: 08/28/22  Next Clinic Visit: 03/04/23  Last Prolia dose: 03/29/22  Labs: 08/28/22 wnl Last DEXA: 01/11/2020 Next DEXA due: overdue, order placed  Orders placed for Prolia x 1 dose. No premedicatons required.   MyChart message sent to patient and provided with phone number for: [facility] [phone]) Ginette Otto  Will follow-up to ensured scheduled and completed   Chesley Mires, PharmD, MPH, BCPS, CPP Clinical Pharmacist (Rheumatology and Pulmonology)

## 2022-09-30 NOTE — Progress Notes (Signed)
Prolia scheduled for 10/08/22

## 2022-10-02 DIAGNOSIS — N3941 Urge incontinence: Secondary | ICD-10-CM | POA: Diagnosis not present

## 2022-10-02 DIAGNOSIS — R350 Frequency of micturition: Secondary | ICD-10-CM | POA: Diagnosis not present

## 2022-10-02 DIAGNOSIS — N3020 Other chronic cystitis without hematuria: Secondary | ICD-10-CM | POA: Diagnosis not present

## 2022-10-04 ENCOUNTER — Telehealth: Payer: Self-pay | Admitting: Cardiovascular Disease

## 2022-10-04 DIAGNOSIS — E610 Copper deficiency: Secondary | ICD-10-CM | POA: Diagnosis not present

## 2022-10-04 DIAGNOSIS — F4312 Post-traumatic stress disorder, chronic: Secondary | ICD-10-CM | POA: Diagnosis not present

## 2022-10-04 DIAGNOSIS — D519 Vitamin B12 deficiency anemia, unspecified: Secondary | ICD-10-CM | POA: Diagnosis not present

## 2022-10-04 DIAGNOSIS — D509 Iron deficiency anemia, unspecified: Secondary | ICD-10-CM | POA: Diagnosis not present

## 2022-10-04 DIAGNOSIS — R32 Unspecified urinary incontinence: Secondary | ICD-10-CM | POA: Diagnosis not present

## 2022-10-04 DIAGNOSIS — D649 Anemia, unspecified: Secondary | ICD-10-CM | POA: Diagnosis not present

## 2022-10-04 DIAGNOSIS — G4700 Insomnia, unspecified: Secondary | ICD-10-CM | POA: Diagnosis not present

## 2022-10-04 DIAGNOSIS — F515 Nightmare disorder: Secondary | ICD-10-CM | POA: Diagnosis not present

## 2022-10-04 DIAGNOSIS — G8929 Other chronic pain: Secondary | ICD-10-CM | POA: Diagnosis not present

## 2022-10-04 DIAGNOSIS — M797 Fibromyalgia: Secondary | ICD-10-CM | POA: Diagnosis not present

## 2022-10-04 NOTE — Telephone Encounter (Signed)
Pt c/o medication issue:  1. Name of Medication: Prazosin 1 MG  2. How are you currently taking this medication (dosage and times per day)?   3. Are you having a reaction (difficulty breathing--STAT)?   4. What is your medication issue?   Patient called in becayse her PCP would like to prescribe this medication to help the patient sleep, but it may lower her BP. She would like to know whether or not Dr. Elease Hashimoto agrees. Please advise.  Patient mentioned having angina during the call.  Pt c/o of Chest Pain: STAT if CP now or developed within 24 hours  1. Are you having CP right now?  No   2. Are you experiencing any other symptoms (ex. SOB, nausea, vomiting, sweating)?  No, but patient mentions having a bladder infection and being prescribed medication for it.  3. How long have you been experiencing CP?  About 1 month  4. Is your CP continuous or coming and going?  Coming and going   5. Have you taken Nitroglycerin?  No  ?

## 2022-10-04 NOTE — Telephone Encounter (Signed)
Returned call to patient and left detailed message per DPR of Dr nahser's comments.   Nahser, Kayla Ping, MD  Lars Mage, RN Caller: Unspecified (Today, 11:37 AM) Kayla [name] has a history of orthostatic hypotension in the past.  This issue has not been quite as serious since she regained some of the weight that she had lost several years ago.  The prednisone may help with sleep but I suspect that it will also cause reduction in her blood pressure.  She will need to be very careful when she stands up from a seated position.  She will need to make sure that she stays hydrated.  If she develops severe or significant orthostatic symptoms, she should discontinue the medication.  PN

## 2022-10-08 ENCOUNTER — Ambulatory Visit (HOSPITAL_COMMUNITY)
Admission: RE | Admit: 2022-10-08 | Discharge: 2022-10-08 | Disposition: A | Discharge status: Home / Self Care | Payer: Medicare Other | Source: Ambulatory Visit | Attending: Rheumatology | Admitting: Rheumatology

## 2022-10-08 DIAGNOSIS — M810 Age-related osteoporosis without current pathological fracture: Secondary | ICD-10-CM | POA: Insufficient documentation

## 2022-10-08 MED ORDER — DENOSUMAB 60 MG/ML ~~LOC~~ SOSY: 60.0000 mg | PREFILLED_SYRINGE | Freq: Once | SUBCUTANEOUS | Status: AC

## 2022-10-08 MED ORDER — DENOSUMAB 60 MG/ML ~~LOC~~ SOSY
PREFILLED_SYRINGE | SUBCUTANEOUS | Status: AC
  Administered 2022-10-08: 60 mg via SUBCUTANEOUS
  Filled 2022-10-08: qty 1

## 2022-10-09 DIAGNOSIS — F4312 Post-traumatic stress disorder, chronic: Secondary | ICD-10-CM | POA: Diagnosis not present

## 2022-10-09 DIAGNOSIS — F515 Nightmare disorder: Secondary | ICD-10-CM | POA: Diagnosis not present

## 2022-10-09 DIAGNOSIS — D509 Iron deficiency anemia, unspecified: Secondary | ICD-10-CM | POA: Diagnosis not present

## 2022-10-09 DIAGNOSIS — E611 Iron deficiency: Secondary | ICD-10-CM | POA: Diagnosis not present

## 2022-10-09 DIAGNOSIS — E610 Copper deficiency: Secondary | ICD-10-CM | POA: Diagnosis not present

## 2022-10-09 DIAGNOSIS — R7401 Elevation of levels of liver transaminase levels: Secondary | ICD-10-CM | POA: Diagnosis not present

## 2022-10-10 ENCOUNTER — Other Ambulatory Visit (HOSPITAL_BASED_OUTPATIENT_CLINIC_OR_DEPARTMENT_OTHER): Payer: Self-pay

## 2022-10-10 DIAGNOSIS — M545 Low back pain, unspecified: Secondary | ICD-10-CM | POA: Diagnosis not present

## 2022-10-10 DIAGNOSIS — F419 Anxiety disorder, unspecified: Secondary | ICD-10-CM | POA: Diagnosis not present

## 2022-10-10 DIAGNOSIS — M7062 Trochanteric bursitis, left hip: Secondary | ICD-10-CM | POA: Diagnosis not present

## 2022-10-10 DIAGNOSIS — Z79899 Other long term (current) drug therapy: Secondary | ICD-10-CM | POA: Diagnosis not present

## 2022-10-10 DIAGNOSIS — G894 Chronic pain syndrome: Secondary | ICD-10-CM | POA: Diagnosis not present

## 2022-10-10 DIAGNOSIS — G609 Hereditary and idiopathic neuropathy, unspecified: Secondary | ICD-10-CM | POA: Diagnosis not present

## 2022-10-10 DIAGNOSIS — F32A Depression, unspecified: Secondary | ICD-10-CM | POA: Diagnosis not present

## 2022-10-10 DIAGNOSIS — M542 Cervicalgia: Secondary | ICD-10-CM | POA: Diagnosis not present

## 2022-10-10 MED ORDER — FENTANYL 25 MCG/HR TD PT72
MEDICATED_PATCH | TRANSDERMAL | 0 refills | Status: DC
  Filled 2022-10-10: qty 10, 30d supply, fill #0

## 2022-10-10 MED ORDER — HYDROCODONE-ACETAMINOPHEN 10-325 MG PO TABS
ORAL_TABLET | ORAL | 0 refills | Status: DC
  Filled 2022-10-10: qty 120, 30d supply, fill #0

## 2022-10-29 ENCOUNTER — Other Ambulatory Visit: Payer: Self-pay | Admitting: Physician Assistant

## 2022-10-29 DIAGNOSIS — E559 Vitamin D deficiency, unspecified: Secondary | ICD-10-CM

## 2022-11-05 DIAGNOSIS — K912 Postsurgical malabsorption, not elsewhere classified: Secondary | ICD-10-CM | POA: Diagnosis not present

## 2022-11-05 DIAGNOSIS — R7309 Other abnormal glucose: Secondary | ICD-10-CM | POA: Diagnosis not present

## 2022-11-05 DIAGNOSIS — K2950 Unspecified chronic gastritis without bleeding: Secondary | ICD-10-CM | POA: Diagnosis not present

## 2022-11-05 DIAGNOSIS — R739 Hyperglycemia, unspecified: Secondary | ICD-10-CM | POA: Diagnosis not present

## 2022-11-11 ENCOUNTER — Other Ambulatory Visit (HOSPITAL_BASED_OUTPATIENT_CLINIC_OR_DEPARTMENT_OTHER): Payer: Self-pay

## 2022-11-11 DIAGNOSIS — M542 Cervicalgia: Secondary | ICD-10-CM | POA: Diagnosis not present

## 2022-11-11 DIAGNOSIS — G8929 Other chronic pain: Secondary | ICD-10-CM | POA: Diagnosis not present

## 2022-11-11 DIAGNOSIS — Z79899 Other long term (current) drug therapy: Secondary | ICD-10-CM | POA: Diagnosis not present

## 2022-11-11 DIAGNOSIS — M7062 Trochanteric bursitis, left hip: Secondary | ICD-10-CM | POA: Diagnosis not present

## 2022-11-11 DIAGNOSIS — F32A Depression, unspecified: Secondary | ICD-10-CM | POA: Diagnosis not present

## 2022-11-11 DIAGNOSIS — G609 Hereditary and idiopathic neuropathy, unspecified: Secondary | ICD-10-CM | POA: Diagnosis not present

## 2022-11-11 DIAGNOSIS — G894 Chronic pain syndrome: Secondary | ICD-10-CM | POA: Diagnosis not present

## 2022-11-11 DIAGNOSIS — M545 Low back pain, unspecified: Secondary | ICD-10-CM | POA: Diagnosis not present

## 2022-11-11 DIAGNOSIS — F419 Anxiety disorder, unspecified: Secondary | ICD-10-CM | POA: Diagnosis not present

## 2022-11-11 MED ORDER — FENTANYL 25 MCG/HR TD PT72
1.0000 | MEDICATED_PATCH | TRANSDERMAL | 0 refills | Status: DC
  Filled 2022-11-11: qty 10, 30d supply, fill #0

## 2022-11-11 MED ORDER — HYDROCODONE-ACETAMINOPHEN 10-325 MG PO TABS
0.5000 | ORAL_TABLET | Freq: Four times a day (QID) | ORAL | 0 refills | Status: DC | PRN
  Filled 2022-11-11: qty 120, 30d supply, fill #0

## 2022-11-19 DIAGNOSIS — N390 Urinary tract infection, site not specified: Secondary | ICD-10-CM | POA: Diagnosis not present

## 2022-11-19 DIAGNOSIS — R634 Abnormal weight loss: Secondary | ICD-10-CM | POA: Diagnosis not present

## 2022-11-19 DIAGNOSIS — A048 Other specified bacterial intestinal infections: Secondary | ICD-10-CM | POA: Diagnosis not present

## 2022-12-05 ENCOUNTER — Inpatient Hospital Stay (HOSPITAL_BASED_OUTPATIENT_CLINIC_OR_DEPARTMENT_OTHER): Payer: Medicare Other | Admitting: Hematology and Oncology

## 2022-12-05 ENCOUNTER — Inpatient Hospital Stay: Payer: Medicare Other | Attending: Internal Medicine

## 2022-12-05 VITALS — BP 109/67 | HR 87 | Temp 98.5°F | Resp 16 | Wt 114.7 lb

## 2022-12-05 DIAGNOSIS — D508 Other iron deficiency anemias: Secondary | ICD-10-CM

## 2022-12-05 DIAGNOSIS — Z9884 Bariatric surgery status: Secondary | ICD-10-CM | POA: Insufficient documentation

## 2022-12-05 DIAGNOSIS — G629 Polyneuropathy, unspecified: Secondary | ICD-10-CM | POA: Diagnosis not present

## 2022-12-05 DIAGNOSIS — D509 Iron deficiency anemia, unspecified: Secondary | ICD-10-CM | POA: Insufficient documentation

## 2022-12-05 LAB — CBC WITH DIFFERENTIAL (CANCER CENTER ONLY)
Abs Immature Granulocytes: 0.01 10*3/uL (ref 0.00–0.07)
Basophils Absolute: 0 10*3/uL (ref 0.0–0.1)
Basophils Relative: 0 %
Eosinophils Absolute: 0.6 10*3/uL — ABNORMAL HIGH (ref 0.0–0.5)
Eosinophils Relative: 9 %
HCT: 40.3 % (ref 36.0–46.0)
Hemoglobin: 12.8 g/dL (ref 12.0–15.0)
Immature Granulocytes: 0 %
Lymphocytes Relative: 29 %
Lymphs Abs: 2 10*3/uL (ref 0.7–4.0)
MCH: 32.1 pg (ref 26.0–34.0)
MCHC: 31.8 g/dL (ref 30.0–36.0)
MCV: 101 fL — ABNORMAL HIGH (ref 80.0–100.0)
Monocytes Absolute: 0.6 10*3/uL (ref 0.1–1.0)
Monocytes Relative: 8 %
Neutro Abs: 3.8 10*3/uL (ref 1.7–7.7)
Neutrophils Relative %: 54 %
Platelet Count: 281 10*3/uL (ref 150–400)
RBC: 3.99 MIL/uL (ref 3.87–5.11)
RDW: 12.3 % (ref 11.5–15.5)
WBC Count: 7.1 10*3/uL (ref 4.0–10.5)
nRBC: 0 % (ref 0.0–0.2)

## 2022-12-05 LAB — FERRITIN: Ferritin: 96 ng/mL (ref 11–307)

## 2022-12-05 LAB — VITAMIN B12: Vitamin B-12: 1788 pg/mL — ABNORMAL HIGH (ref 180–914)

## 2022-12-05 LAB — IRON AND IRON BINDING CAPACITY (CC-WL,HP ONLY)
Iron: 72 ug/dL (ref 28–170)
Saturation Ratios: 28 % (ref 10.4–31.8)
TIBC: 260 ug/dL (ref 250–450)
UIBC: 188 ug/dL (ref 148–442)

## 2022-12-05 NOTE — Progress Notes (Signed)
[facility] [phone]   [phone]  PROGRESS NOTE  Patient Care Team: Lewis Moccasin, MD as PCP - General (Family Medicine) Nahser, Deloris Ping, MD as PCP - Cardiology (Cardiology) Rainwater, Marilynn Rail, PA-C as Physician Assistant (Physician Assistant) Pete Glatter, Danford Bad., MD as Referring Physician (Urology) Nelson Chimes, MD as Consulting Physician (Ophthalmology) Iva Boop, MD as Consulting Physician (Gastroenterology) Drema Dallas, DO as Consulting Physician (Neurology) Pollyann Savoy, MD as Consulting Physician (Rheumatology) Vidal Schwalbe, PsyD (Inactive) (Psychiatry) Sharman Cheek, DMD (Dentistry) Genene Churn, DC as Referring Physician (Chiropractic Medicine) Linna Darner, RD as Dietitian (Family Medicine) Linna Darner, RD as Dietitian (Family Medicine) Lewis Moccasin, MD as Attending Physician (Family Medicine)  Hematological/Oncological History # Iron Deficiency Anemia 2/2 to Gastric Bypass 12/04/2020: WBC 6.6, Hgb 11.0, MCV 86.9, Plt 293 03/26/2021: WBC 5.9, Hgb 10.3, MCV 86.3, Plt 306, ferritin 8.2 04/18/2021: establish care with Dr. Leonides Schanz  04/19/2021: Received 1 dose of IV Monoferric 1000 mg 05/30/2021: WBC 7.5, Hgb 12.7, MCV 89.6, Plt 264  Interval History:  Kayla [name] 77 y.o. female with medical history significant for iron deficiency anemia secondary to gastric bypass who presents for a follow up visit. The patient's last visit was on 06/12/2022. In the interim since the last visit she had gained a substantial amount of weight up to 129 pounds, but is now dropping again down to 114 today.  On exam today Ms. Throne reports she has been well overall in the interim since her last visit.  She has had no major changes in her health though she does note continued peripheral neuropathy.  She notes that because of the neuropathy she is not able to get up and stand on her feet for long periods of time to cook.   She only cooks about 2 to 3 days/week.  She reports that she can afford food but just has difficulty repairing it.  She reports that she was also diagnosed with H. pylori and has gone through multiple rounds of antibiotics to try to treat it.  She notes that overall she does feel better and is no longer on TPN therapy.  She notes that her energy levels are "horrible".  She reports she could fall asleep right now.  She is not having any lightheadedness, dizziness, or shortness of breath.  She reports he does have periodic falls but this is mostly due to weakness of her lower extremities.  She does have some occasional sinus drainage but is not currently taking any medication for it such as Claritin.  She denies any overt signs of bleeding, bruising, or dark stools.  She currently denies any fevers, chills, sweats, nausea, or diarrhea.  A full 10 point ROS is listed below.  MEDICAL HISTORY:  Past Medical History:  Diagnosis Date   Adjustment disorder with depressed mood 03/18/2007   Allergy    Anal fissure 10/08/2016   Angina    takes Propanolol prn and it stops her chest   Anorexia nervosa 11/11/2019   Barrett esophagus    not consistently present   Cataract    removed  but had retinal detachment - repeat cataract right eye   Cervical facet joint syndrome 10/08/2016   Chronic constipation 10/04/2010   Chronic interstitial cystitis 10/16/2011   Chronic kidney disease    kidney stones and UTI   Chronic maxillary sinusitis 09/15/2013   Chronic narcotic dependence 10/03/2018   Complication of anesthesia    either  BP  or pulse dropped with last surgery   Degeneration of cervical intervertebral disc 03/18/2007   Degeneration of lumbar or lumbosacral intervertebral disc 03/18/2007   Diaphragmatic hernia 03/18/2007   Dysrhythmia    brought on by stress   Epigastric pain 11/11/2019   Esophageal dysphagia 10/03/2018   External hemorrhoids    Fibromyalgia    neuropathy knees ankles and toes,  bladder   Generalized anxiety disorder 03/18/2007   GERD (gastroesophageal reflux disease) 03/18/2007   HA (headache)-chronic/tension type 07/28/2012   Heart murmur    Hiatal hernia    History of anemia    History of gastric bypass 02/11/2014   1999 at [facility]   History of hyperparathyroidism 01/22/2016   History of kidney stones    Hypertension    Hypoglycemia 11/08/2016   IBS (irritable bowel syndrome)    Internal hemorrhoids    LLQ abdominal pain 11/11/2019   Macular degeneration    Malnutrition 11/11/2019   Mechanical breakdown implanted electronic neurostimulator spinal cord 11/16/2018   Mitral regurgitation 12/11/2015   MVP (mitral valve prolapse) 12/17/2017   Myalgia 03/18/2007   Nephrolithiasis 07/28/2012   lithotrip 2013-Eskridge,MD   Neuropathic pain 01/23/2018   Orthostatic hypotension 11/20/2016   Osteoarthritis    all over   Osteopetrosis    Osteoporosis 07/28/2012   Peripheral vascular disease    superficial phlebitis in left calf   Personal history of colonic polyps 07/22/2013   07/2013 - 3 small adenomas - repeat colonoscopy 2018   PTSD (post-traumatic stress disorder)    Rosacea 11/02/2017   Small intestinal bacterial overgrowth 08/05/2017   + lactulose breath test 06/2017 Xifaxan Rx   Type II diabetes mellitus 11/18/2016   Urinary tract infection     SURGICAL HISTORY: Past Surgical History:  Procedure Laterality Date   ABDOMINAL HYSTERECTOMY     APPENDECTOMY     CARDIAC CATHETERIZATION  03/22/1991   EF 61%   CARDIOVASCULAR STRESS TEST  10/03/2006   CERVICAL SPINE SURGERY  02/14/2022   BLOCK   CHOLECYSTECTOMY     COLONOSCOPY  06/23/2008   normal   DILATION AND CURETTAGE OF UTERUS     x2   ESOPHAGUS SURGERY     EXPLORATORY LAPAROTOMY  1993   EYE SURGERY     GASTRIC BYPASS  1999   GASTRIC RESTRICTION SURGERY  1991   LASIK Bilateral    Right Arm Surgery     Right Knee Arthroscopy     Rt wrist fx  2009   spinal surgery battery implant      stomach stappeling  1991   STONE EXTRACTION WITH BASKET  2012   TONSILLECTOMY     TUBAL LIGATION     UPPER GASTROINTESTINAL ENDOSCOPY  06/23/2008   w/Dil, Barrett's esophagus   US ECHOCARDIOGRAPHY  01/21/2007   EF 55-60%   US ECHOCARDIOGRAPHY  08/30/2003   EF 55-60%    SOCIAL HISTORY: Social History   Socioeconomic History   Marital status: Married    Spouse name: Cynda Familia.   Number of children: 2   Years of education: 15   Highest education level: Some college, no degree  Occupational History   Occupation: Disability    Employer: RETIRED  Tobacco Use   Smoking status: Never    Passive exposure: Past   Smokeless tobacco: Never  Vaping Use   Vaping status: Never Used  Substance and Sexual Activity   Alcohol use: No   Drug use: No   Sexual activity: Not Currently  Other  Topics Concern   Not on file  Social History Narrative   Right handed   Two story home   She retired on disability   Married   2 Children   Social Determinants of Health   Financial Resource Strain: Medium Risk (11/22/2022)   Received from [facility]   Overall Financial Resource Strain (CARDIA)    Difficulty of Paying Living Expenses: Somewhat hard  Food Insecurity: No Food Insecurity (11/22/2022)   Received from [facility]   Hunger Vital Sign    Worried About Running Out of Food in the Last Year: Never true    Ran Out of Food in the Last Year: Never true  Transportation Needs: Unmet Transportation Needs (11/22/2022)   Received from [facility] - Transportation    In the past 12 months, has lack of transportation kept you from medical appointments or from getting medications?: Yes    Lack of Transportation (Non-Medical): Yes  Physical Activity: Inactive (11/22/2022)   Received from [facility]   Exercise Vital Sign    Days of Exercise per Week: 0 days    Minutes of Exercise per Session: 0 min  Stress: Stress  Concern Present (11/22/2022)   Received from [facility] - Occupational Stress Questionnaire    Feeling of Stress : Very much  Social Connections: Moderately Integrated (11/22/2022)   Received from [facility]   Social Connection and Isolation Panel [NHANES]    Frequency of Communication with Friends and Family: More than three times a week    Frequency of Social Gatherings with Friends and Family: Once a week    Attends Religious Services: More than 4 times per year    Active Member of [employer] or Organizations: No    Attends Banker Meetings: Never    Marital Status: Married  Catering manager Violence: Unknown (08/25/2022)   Received from [facility]   HITS    Physically Hurt: Not on file    Insult or Talk Down To: Not on file    Threaten Physical Harm: Not on file    Scream or Curse: Not on file    FAMILY HISTORY: Family History  Problem Relation Age of Onset   Stroke Mother    Lung cancer Mother    Heart disease Mother    Diabetes Mother    Hypertension Father    Stroke Father    Heart disease Father    Kidney disease Father    Seizures Father        epilepsy, and sisters x 2   Memory loss Father    Lung cancer Sister    Breast cancer Sister    Cancer - Lung Sister    Heart disease Sister    Diabetes Sister    Pancreatic cancer Sister    Healthy Daughter    Healthy Son    Colon cancer Maternal Aunt        12 relatives   Colon cancer Paternal Aunt    Colon cancer Paternal Aunt    Uterine cancer Other        aunt   Heart disease Other        grandmother   Esophageal cancer Neg Hx    Rectal cancer Neg Hx    Stomach cancer Neg Hx    Colon polyps Neg Hx     ALLERGIES:  is allergic to  ambien [zolpidem tartrate], codeine, oxycodone-acetaminophen, tylox [oxycodone-acetaminophen], zolpidem tartrate, celebrex [celecoxib], darvocet [propoxyphene n-acetaminophen], darvon  [propoxyphene], lorcet [hydrocodone-acetaminophen], opium, vicodin [hydrocodone-acetaminophen], wheat, amoxicillin, and penicillins.  MEDICATIONS:  Current Outpatient Medications  Medication Sig Dispense Refill   aspirin-acetaminophen-caffeine (EXCEDRIN MIGRAINE) 250-250-65 MG tablet Take 2 tablets by mouth 2 (two) times daily.     Biotin 1000 MCG CHEW Chew 1 tablet by mouth daily.     clonazePAM (KLONOPIN) 0.5 MG tablet Take 0.25 mg by mouth as needed.     COLLAGEN PO Take by mouth daily.     Continuous Blood Gluc Receiver (DEXCOM G7 RECEIVER) DEVI by Does not apply route.     Copper Gluconate POWD Take by mouth. (Patient not taking: Reported on 08/28/2022)     cyclobenzaprine (FLEXERIL) 10 MG tablet Take by mouth.     cycloSPORINE (RESTASIS) 0.05 % ophthalmic emulsion Place 1 drop into both eyes 2 (two) times daily. 5.5 mL 5   diclofenac Sodium (VOLTAREN) 1 % GEL Apply 2 to 4 gram to the affected area(s) (painful joints) up to 4 times daily as needed. 600 g 1   dicyclomine (BENTYL) 10 MG capsule Take 1-2 tab 3 times daily AC as needed for spasms and cramping. 270 capsule 1   Docusate Sodium (DSS) 100 MG CAPS Take by mouth.     DULoxetine (CYMBALTA) 30 MG capsule Take 1 capsule (30 mg total) by mouth 2 (two) times daily. 180 capsule 3   Erenumab-aooe 70 MG/ML SOAJ Inject into the skin.     famotidine (PEPCID) 20 MG tablet Take by mouth.     fentaNYL (DURAGESIC) 25 MCG/HR Place 1 patch onto the skin every 3 (three) days. 10 patch 0   furosemide (LASIX) 40 MG tablet Take by mouth.     glucagon 1 MG injection Inject 1 mg into the muscle once as needed for up to 1 dose. 1 each 12   HYDROcodone-acetaminophen (NORCO) 10-325 MG tablet Take 1/2 - 1 tablet by mouth 4 (four) times daily as needed for pain. Max Daily Dose: 4 tablets. 120 tablet 0   HYDROcodone-acetaminophen (NORCO) 10-325 MG tablet Take 1/2 to 1 tablet by mouth four times daily, if needed for pain, max daily dose: 4 Tablets 120 tablet 0    HYDROcodone-acetaminophen (NORCO) 10-325 MG tablet Take 1/2 to 1 tablet four times daily, if needed for pain, max daily dose: 4 Tablet 120 tablet 0   HYDROcodone-acetaminophen (NORCO) 10-325 MG tablet Take 0.5-1 tablets by mouth 4 (four) times daily as needed for pain. Max daily dose: 4 tablets. 120 tablet 0   hydrocortisone cream 1 % Apply topically.     hypromellose (GENTEAL) 0.3 % GEL ophthalmic ointment Place 1 drop into both eyes at bedtime. Reported on 02/11/2015     latanoprost (XALATAN) 0.005 % ophthalmic solution Apply to eye.     lidocaine (LIDODERM) 5 % Place 1 patch onto the skin as needed. Remove & Discard patch within 12 hours. May dispense as 3 month supply 90 patch 3   Lifitegrast 5 % SOLN Place 1 drop into both eyes daily.     magnesium hydroxide (MILK OF MAGNESIA) 400 MG/5ML suspension Take by mouth.     metroNIDAZOLE (METROGEL) 1 % gel Apply topically daily. 45 g 0   mirabegron ER (MYRBETRIQ) 25 MG TB24 tablet Take 1 tablet (25 mg total) by mouth daily. 90 tablet 1   naloxegol oxalate (MOVANTIK) 25 MG TABS tablet Take 25 mg by mouth daily.  Naloxone HCl 0.4 MG/0.4ML SOAJ Administer 0.4mg  [place] at first sign of opioid overdose and repeat every 2 minutes as needed for resuscitation. Call 911 immediately 5 Package 0   NON FORMULARY [facility]  Peripheral Neuropathy Cream- Bupivacaine 1%, Doxepin 3%, Gabapentin 6%, Pentoxifylline 3%, Topiramate 1% Apply 1-2 grams to affected area 3-4 times daily Qty. 120 gm 3 refills (Patient not taking: Reported on 08/28/2022)     NON FORMULARY GABA6%LIDO5%DICL3% (Patient not taking: Reported on 08/28/2022)     nystatin-triamcinolone (MYCOLOG II) cream      ondansetron (ZOFRAN) 8 MG tablet Take 1 tablet (8 mg total) by mouth every 8 (eight) hours as needed for nausea or vomiting. 60 tablet 0   phenazopyridine (PYRIDIUM) 95 MG tablet Take by mouth.     potassium chloride SA (KLOR-CON M) 20 MEQ tablet Take by mouth.     pregabalin (LYRICA)  50 MG capsule Take 1 capsule in morning and 2 capsules at night 270 capsule 3   Probiotic Product (PROBIOTIC-10 PO) Take by mouth.      rizatriptan (MAXALT-MLT) 10 MG disintegrating tablet Take 1 tablet (10 mg total) by mouth as needed for migraine. May repeat in 2 hours if needed (Patient not taking: Reported on 08/28/2022) 27 tablet 3   solifenacin (VESICARE) 5 MG tablet Take 5 mg by mouth daily.     sucralfate (CARAFATE) 1 GM/10ML suspension Take 10 mLs (1 g total) by mouth 4 (four) times daily. 420 mL 1   Vitamin D, Ergocalciferol, (DRISDOL) 1.25 MG (50000 UNIT) CAPS capsule Take 1 capsule (50,000 Units total) by mouth every 7 (seven) days. 12 capsule 0   No current facility-administered medications for this visit.    REVIEW OF SYSTEMS:   Constitutional: ( - ) fevers, ( - )  chills , ( - ) night sweats Eyes: ( - ) blurriness of vision, ( - ) double vision, ( - ) watery eyes Ears, nose, mouth, throat, and face: ( - ) mucositis, ( - ) sore throat Respiratory: ( - ) cough, ( - ) dyspnea, ( - ) wheezes Cardiovascular: ( - ) palpitation, ( - ) chest discomfort, ( - ) lower extremity swelling Gastrointestinal:  ( - ) nausea, ( - ) heartburn, ( - ) change in bowel habits Skin: ( - ) abnormal skin rashes Lymphatics: ( - ) new lymphadenopathy, ( - ) easy bruising Neurological: ( - ) numbness, ( - ) tingling, ( - ) new weaknesses Behavioral/Psych: ( - ) mood change, ( - ) new changes  All other systems were reviewed with the patient and are negative.  PHYSICAL EXAMINATION:  Vitals:   12/05/22 1136  BP: 109/67  Pulse: 87  Resp: 16  Temp: 98.5 F (36.9 C)  SpO2: 97%    Filed Weights   12/05/22 1136  Weight: 114 lb 11.2 oz (52 kg)     GENERAL: Cachectic appearing elderly Caucasian female, no distress and comfortable SKIN: skin color, texture, turgor are normal, no rashes or significant lesions EYES: conjunctiva are pink and non-injected, sclera clear LUNGS: clear to auscultation and  percussion with normal breathing effort HEART: regular rate & rhythm and no murmurs and no lower extremity edema Musculoskeletal: no cyanosis of digits and no clubbing  PSYCH: alert & oriented x 3, fluent speech NEURO: no focal motor/sensory deficits  LABORATORY DATA:  I have reviewed the data as listed    Latest Ref Rng & Units 12/05/2022   10:44 AM 08/28/2022   11:51 AM  06/12/2022    1:08 PM  CBC  WBC 4.0 - 10.5 K/uL 7.1  6.0  7.6   Hemoglobin 12.0 - 15.0 g/dL 16.1  09.6  04.5   Hematocrit 36.0 - 46.0 % 40.3  38.5  37.1   Platelets 150 - 400 K/uL 281  262  267        Latest Ref Rng & Units 08/28/2022   11:51 AM 06/12/2022    1:08 PM 03/19/2022   11:58 AM  CMP  Glucose 65 - 99 mg/dL 90  409  88   BUN 7 - 25 mg/dL 28  23  31    Creatinine 0.60 - 1.00 mg/dL 8.11  9.14  7.82   Sodium 135 - 146 mmol/L 141  141  141   Potassium 3.5 - 5.3 mmol/L 5.4  4.8  5.5   Chloride 98 - 110 mmol/L 106  110  103   CO2 20 - 32 mmol/L 30  28  33   Calcium 8.6 - 10.4 mg/dL 9.4  8.2  9.2   Total Protein 6.1 - 8.1 g/dL 5.2  5.3  5.4   Total Bilirubin 0.2 - 1.2 mg/dL 0.5  0.3  0.3   Alkaline Phos 38 - 126 U/L  66    AST 10 - 35 U/L 32  30  29   ALT 6 - 29 U/L 33  24  28     Lab Results  Component Value Date   MPROTEIN Not Observed 04/03/2017    RADIOGRAPHIC STUDIES: No results found.  ASSESSMENT & PLAN Kayla [name] is a 77 y.o.  female with medical history significant for iron deficiency anemia secondary to gastric bypass who presents for a follow up visit.  # Iron Deficiency Anemia in the Setting of Gastric Bypass -- Patients with gastric bypass have difficulty absorbing iron which is a common cause of iron deficiency in this population. --Last received 1 dose of IV monoferric 1000 mg on 04/19/2021 --Labs today show hemoglobin 12.8, WBC 7.1, MCV 101, Plt 281.  --Patient requested copper levels be ordered after we have drawn her labs.  Unfortunately this is especially lab and not able to  be added on.  We will order with her next set of labs. --Recommend IV to help bolster levels if her iron were to drop again.  --return to clinic in 6 months time to reevaluate with interval lab visit in 3 months.  Orders Placed This Encounter  Procedures   Copper, serum    Standing Status:   Future    Standing Expiration Date:   12/05/2023    All questions were answered. The patient knows to call the clinic with any problems, questions or concerns.  A total of more than 30 minutes were spent on this encounter with face-to-face time and non-face-to-face time, including preparing to see the patient, ordering tests and/or medications, counseling the patient and coordination of care as outlined above.   Ulysees Barns, MD Department of Hematology/Oncology [facility] at [facility] Phone: [phone] Pager: [phone] Email: [email or web address]    12/05/2022 4:35 PM

## 2022-12-06 DIAGNOSIS — R739 Hyperglycemia, unspecified: Secondary | ICD-10-CM | POA: Diagnosis not present

## 2022-12-06 DIAGNOSIS — E785 Hyperlipidemia, unspecified: Secondary | ICD-10-CM | POA: Diagnosis not present

## 2022-12-06 DIAGNOSIS — R5383 Other fatigue: Secondary | ICD-10-CM | POA: Diagnosis not present

## 2022-12-09 DIAGNOSIS — G8929 Other chronic pain: Secondary | ICD-10-CM | POA: Diagnosis not present

## 2022-12-09 DIAGNOSIS — M546 Pain in thoracic spine: Secondary | ICD-10-CM | POA: Diagnosis not present

## 2022-12-09 DIAGNOSIS — M542 Cervicalgia: Secondary | ICD-10-CM | POA: Diagnosis not present

## 2022-12-14 ENCOUNTER — Other Ambulatory Visit (HOSPITAL_BASED_OUTPATIENT_CLINIC_OR_DEPARTMENT_OTHER): Payer: Self-pay

## 2022-12-14 DIAGNOSIS — F32A Depression, unspecified: Secondary | ICD-10-CM | POA: Diagnosis not present

## 2022-12-14 DIAGNOSIS — Z79899 Other long term (current) drug therapy: Secondary | ICD-10-CM | POA: Diagnosis not present

## 2022-12-14 DIAGNOSIS — M7062 Trochanteric bursitis, left hip: Secondary | ICD-10-CM | POA: Diagnosis not present

## 2022-12-14 DIAGNOSIS — G609 Hereditary and idiopathic neuropathy, unspecified: Secondary | ICD-10-CM | POA: Diagnosis not present

## 2022-12-14 DIAGNOSIS — F419 Anxiety disorder, unspecified: Secondary | ICD-10-CM | POA: Diagnosis not present

## 2022-12-14 DIAGNOSIS — G8929 Other chronic pain: Secondary | ICD-10-CM | POA: Diagnosis not present

## 2022-12-14 DIAGNOSIS — M542 Cervicalgia: Secondary | ICD-10-CM | POA: Diagnosis not present

## 2022-12-14 DIAGNOSIS — G894 Chronic pain syndrome: Secondary | ICD-10-CM | POA: Diagnosis not present

## 2022-12-14 DIAGNOSIS — M545 Low back pain, unspecified: Secondary | ICD-10-CM | POA: Diagnosis not present

## 2022-12-14 MED ORDER — HYDROCODONE-ACETAMINOPHEN 10-325 MG PO TABS
1.0000 | ORAL_TABLET | Freq: Four times a day (QID) | ORAL | 0 refills | Status: DC | PRN
  Filled 2022-12-14: qty 120, 30d supply, fill #0

## 2022-12-14 MED ORDER — FENTANYL 25 MCG/HR TD PT72
1.0000 | MEDICATED_PATCH | TRANSDERMAL | 0 refills | Status: DC
  Filled 2022-12-14: qty 10, 30d supply, fill #0

## 2022-12-14 MED ORDER — NALOXONE HCL 4 MG/0.1ML NA LIQD
1.0000 | NASAL | 0 refills | Status: DC
  Filled 2022-12-14: qty 2, 30d supply, fill #0

## 2022-12-16 ENCOUNTER — Other Ambulatory Visit (HOSPITAL_BASED_OUTPATIENT_CLINIC_OR_DEPARTMENT_OTHER): Payer: Self-pay

## 2022-12-16 DIAGNOSIS — E782 Mixed hyperlipidemia: Secondary | ICD-10-CM | POA: Diagnosis not present

## 2022-12-16 DIAGNOSIS — Z7185 Encounter for immunization safety counseling: Secondary | ICD-10-CM | POA: Diagnosis not present

## 2022-12-16 DIAGNOSIS — E559 Vitamin D deficiency, unspecified: Secondary | ICD-10-CM | POA: Diagnosis not present

## 2022-12-16 DIAGNOSIS — E610 Copper deficiency: Secondary | ICD-10-CM | POA: Diagnosis not present

## 2022-12-16 DIAGNOSIS — R7989 Other specified abnormal findings of blood chemistry: Secondary | ICD-10-CM | POA: Diagnosis not present

## 2022-12-16 DIAGNOSIS — M810 Age-related osteoporosis without current pathological fracture: Secondary | ICD-10-CM | POA: Diagnosis not present

## 2022-12-16 DIAGNOSIS — Z789 Other specified health status: Secondary | ICD-10-CM | POA: Diagnosis not present

## 2022-12-16 DIAGNOSIS — Z23 Encounter for immunization: Secondary | ICD-10-CM | POA: Diagnosis not present

## 2022-12-16 DIAGNOSIS — D649 Anemia, unspecified: Secondary | ICD-10-CM | POA: Diagnosis not present

## 2022-12-18 ENCOUNTER — Other Ambulatory Visit (HOSPITAL_COMMUNITY): Payer: Self-pay

## 2022-12-18 DIAGNOSIS — U071 COVID-19: Secondary | ICD-10-CM | POA: Diagnosis not present

## 2022-12-18 DIAGNOSIS — Z79899 Other long term (current) drug therapy: Secondary | ICD-10-CM | POA: Diagnosis not present

## 2022-12-18 MED ORDER — MOLNUPIRAVIR 200 MG PO CAPS
4.0000 | ORAL_CAPSULE | Freq: Two times a day (BID) | ORAL | 0 refills | Status: AC
  Filled 2022-12-18: qty 40, 5d supply, fill #0

## 2023-01-02 DIAGNOSIS — Z0000 Encounter for general adult medical examination without abnormal findings: Secondary | ICD-10-CM | POA: Diagnosis not present

## 2023-01-02 DIAGNOSIS — E60 Dietary zinc deficiency: Secondary | ICD-10-CM | POA: Diagnosis not present

## 2023-01-02 DIAGNOSIS — Z23 Encounter for immunization: Secondary | ICD-10-CM | POA: Diagnosis not present

## 2023-01-02 DIAGNOSIS — E610 Copper deficiency: Secondary | ICD-10-CM | POA: Diagnosis not present

## 2023-01-02 DIAGNOSIS — Z9884 Bariatric surgery status: Secondary | ICD-10-CM | POA: Diagnosis not present

## 2023-01-02 DIAGNOSIS — Z1331 Encounter for screening for depression: Secondary | ICD-10-CM | POA: Diagnosis not present

## 2023-01-02 DIAGNOSIS — Z1339 Encounter for screening examination for other mental health and behavioral disorders: Secondary | ICD-10-CM | POA: Diagnosis not present

## 2023-01-10 DIAGNOSIS — N12 Tubulo-interstitial nephritis, not specified as acute or chronic: Secondary | ICD-10-CM | POA: Diagnosis not present

## 2023-01-10 DIAGNOSIS — N1330 Unspecified hydronephrosis: Secondary | ICD-10-CM | POA: Diagnosis not present

## 2023-01-10 DIAGNOSIS — N390 Urinary tract infection, site not specified: Secondary | ICD-10-CM | POA: Diagnosis not present

## 2023-01-10 DIAGNOSIS — N200 Calculus of kidney: Secondary | ICD-10-CM | POA: Diagnosis not present

## 2023-01-10 DIAGNOSIS — N3000 Acute cystitis without hematuria: Secondary | ICD-10-CM | POA: Diagnosis not present

## 2023-01-14 ENCOUNTER — Other Ambulatory Visit (HOSPITAL_BASED_OUTPATIENT_CLINIC_OR_DEPARTMENT_OTHER): Payer: Self-pay

## 2023-01-14 DIAGNOSIS — G894 Chronic pain syndrome: Secondary | ICD-10-CM | POA: Diagnosis not present

## 2023-01-14 DIAGNOSIS — Z79899 Other long term (current) drug therapy: Secondary | ICD-10-CM | POA: Diagnosis not present

## 2023-01-14 DIAGNOSIS — F419 Anxiety disorder, unspecified: Secondary | ICD-10-CM | POA: Diagnosis not present

## 2023-01-14 DIAGNOSIS — M542 Cervicalgia: Secondary | ICD-10-CM | POA: Diagnosis not present

## 2023-01-14 DIAGNOSIS — F32A Depression, unspecified: Secondary | ICD-10-CM | POA: Diagnosis not present

## 2023-01-14 DIAGNOSIS — G609 Hereditary and idiopathic neuropathy, unspecified: Secondary | ICD-10-CM | POA: Diagnosis not present

## 2023-01-14 DIAGNOSIS — M545 Low back pain, unspecified: Secondary | ICD-10-CM | POA: Diagnosis not present

## 2023-01-14 DIAGNOSIS — M7062 Trochanteric bursitis, left hip: Secondary | ICD-10-CM | POA: Diagnosis not present

## 2023-01-14 MED ORDER — HYDROCODONE-ACETAMINOPHEN 10-325 MG PO TABS
0.5000 | ORAL_TABLET | Freq: Four times a day (QID) | ORAL | 0 refills | Status: DC | PRN
  Filled 2023-01-14: qty 120, 30d supply, fill #0

## 2023-01-14 MED ORDER — FENTANYL 25 MCG/HR TD PT72
1.0000 | MEDICATED_PATCH | TRANSDERMAL | 0 refills | Status: DC
  Filled 2023-01-14: qty 10, 30d supply, fill #0

## 2023-01-21 ENCOUNTER — Telehealth: Payer: Self-pay | Admitting: Internal Medicine

## 2023-01-21 NOTE — Telephone Encounter (Signed)
 Patient would like to know which medication was given to her for H Pylori infection.

## 2023-01-21 NOTE — Telephone Encounter (Signed)
 Patient calling back to f/u on previous note. Please advise.   Thank you

## 2023-01-21 NOTE — Telephone Encounter (Signed)
 I tried to reach Kayla [name], the phone rings and then goes busy. Tried several times.

## 2023-01-21 NOTE — Telephone Encounter (Signed)
 I dont see where this medication was prescribed and this patient is currently with [facility] please call patient back and tell her to call them regarding this medication question. She hasn't seen [place] since 2023 and I dont see anything from [place] about this

## 2023-01-22 NOTE — Telephone Encounter (Signed)
I left a message for her to call us back.

## 2023-01-22 NOTE — Telephone Encounter (Signed)
 Tried to reach her before going home, the call went to voicemail.

## 2023-01-22 NOTE — Telephone Encounter (Signed)
 Patient called and stated she would like to speak to PJ  about the medication for H polori. Patient also updated her contact information in order for her call to be returned. Please advise.

## 2023-01-29 NOTE — Telephone Encounter (Signed)
 Left her yet another message and our # for her to reach back out to us  if needed.

## 2023-02-03 ENCOUNTER — Encounter: Payer: Self-pay | Admitting: Neurology

## 2023-02-14 ENCOUNTER — Other Ambulatory Visit (HOSPITAL_BASED_OUTPATIENT_CLINIC_OR_DEPARTMENT_OTHER): Payer: Self-pay

## 2023-02-14 ENCOUNTER — Other Ambulatory Visit: Payer: Self-pay

## 2023-02-14 MED ORDER — HYDROCODONE-ACETAMINOPHEN 10-325 MG PO TABS
0.5000 | ORAL_TABLET | Freq: Four times a day (QID) | ORAL | 0 refills | Status: DC | PRN
  Filled 2023-02-14: qty 120, 30d supply, fill #0

## 2023-02-14 MED ORDER — FENTANYL 25 MCG/HR TD PT72: 1.0000 | MEDICATED_PATCH | TRANSDERMAL | 0 refills | Status: DC

## 2023-02-14 MED ORDER — HYDROCODONE-ACETAMINOPHEN 10-325 MG PO TABS: 0.5000 | ORAL_TABLET | Freq: Four times a day (QID) | ORAL | 0 refills | Status: DC | PRN

## 2023-02-14 MED ORDER — FENTANYL 25 MCG/HR TD PT72
1.0000 | MEDICATED_PATCH | TRANSDERMAL | 0 refills | Status: DC
  Filled 2023-02-14: qty 10, 30d supply, fill #0

## 2023-02-18 NOTE — Progress Notes (Signed)
 NEUROLOGY FOLLOW UP OFFICE NOTE  BEAUTIFUL PENSYL [id number]  Assessment/Plan:   Migraine without aura, without status migrainosus, not intractable Idiopathic polyneuropathy Right upper extremity pain - consider ulnar neuropathy vs carpal tunnel syndrome Chronic pain syndrome    Regarding right upper extremity pain, will try elbow pads and not flexing elbow. For neuralgia:  Increase Cymbalta  to 90mg  daily.  Continue 0.Lyrica  50mg  in am and 100mg  in pm.   Migraine prevention:  Emgality  Migraine rescue:  rizatriptan  10mg .  Fioricet sparingly. Limit use of pain relievers to no more than 2 days out of week to prevent risk of rebound or medication-overuse headache. Keep headache diary Follow up 6 months.      Subjective:  Kayla [name] is a 78 year old woman with fibromyalgia, type 2 diabetes, anxiety with history of PTSD, degenerative disc disease of cervical and lumbar spine, and history of nephrolithiasis and gastric bypass who follows up for polyneuropathy, migraine   UPDATE: Migraine: No migraines but continues to have mild headaches.     Neuropathy/Chronic pain Taking Lyrica  50mg  in AM and 100mg  in PM (higher doses caused cognitive clouding), duloxetine  30mg  BID. Fentanyl  patch, Norco.  No longer getting nerve blocks (ineffective) For several months, endorses painful burning and searing pain in the ulnar side of her right hand and forearm..  Advised to wear wrist splint.  Ineffective but causes burning down the forearm.  Fingers lock up.  Can't type.  Current NSAIDS:  flurbiprofen ; Celebrex  Current analgesics:  Excedrin Migraine, Lidoderm , voltaren  1%, Fentanyl  patch Current triptans:  rizatriptan  10mg  Current ergotamine:  no Current anti-emetic:  Zofran  8mg  Current muscle relaxants:  Flexeril  10mg   Current anti-anxiolytic:  clonazepam  Current Antihypertensive medications:  Lasix  Current Antidepressant medications:  Cymbalta  30mg  BID Current Anticonvulsant medications:   Lyrica  50mg  in AM and 100mg  at night, Current anti-CGRP:  Emgality  Current Vitamins/Herbal/Supplements:  turmeric Current Antihistamines/Decongestants:  no    Caffeine :  2 cups of coffee daily Alcohol :  no Smoker:  no Diet:  poor Exercise:  no Depression:  no; Anxiety:  yes Sleep hygiene:  poor Chronic pain syndrome:  Followed by pain management.  I have her on Lyrica .  She is prescribed Cymbalta  by another provider.   HISTORY: I  Neuropathy:  Since July 2018, she has had increased swelling and burning numbness and tingling in the legs.  She had lower extremity vascular ABIs on 08/20/16, which was negative.  She has history of B12 deficiency but she takes injections and recent B12 level from 09/21/16 was over 2000.  Methylmalonic acid level was 209, RPR nonreactive, homocysteine 7.8.  Labs from 07/12/16 include normal SPEP, Sed Rate 2, CRP less than 0.3, and TSH 2.430 .  Vitamin D  was 23 and she was advised to start supplementation.  Serum glucose has been 70s up to 110.  She also takes B6.  She also has history of weight loss.  She has fibromyalgia and was previously diagnosed with neuropathy by another neurologist.  She has a spinal cord stimulator.  She has been on gabapentin  for many years.  It was briefly discontinued to see if it was contributing to the swelling.  She reported no significant change, so it was restarted (she takes 300mg  4 times daily), although she thinks it helped a little bit.  NCV-EMG from 10/21/16 demonstrated chronic sensorimotor polyneuropathy of the predominantly axonal type as well as mild left median neuropathy at or distal to the wrist.  Other labs include B1 146.1, B6 10.7, Sed Rate  2, HIV negative, TSH 1.780, UPEP negative.  She went to [facility] for evaluation of neuropathy.  Selenium, Zinc , paraneoplastic panel and genetic testing Copper  was low, so she was advised to start supplement.  B6 was still elevated despite stopping supplements 3 prior.     She was wondering if her  neuropathy could be secondary to [employer] exposure.  Her husband was a [place] veteran and reports cases of spouses exposed to [employer] through their husband's semen.  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  Her neuropathic symptoms started shortly after her return.       II Migraine: Onset:  Since 1970, after her twin newborns passed away.  She has lived with life-long family-related stress Location:  Migraines are unilateral/parietal or bilateral retro-orbital, chronic tension type (bifrontal) Quality:  Severe crushing Initial Intensity:  10/10 Aura:  no Prodrome:  no Associated symptoms:  Photophobia, phonophobia.  No nausea, vomiting or visual disturbance Initial Duration:  Migraines 1 hour with sumatriptan , other headaches 15 minutes with Fioricet Initial Frequency:  Daily (3 to 4 days a month are severe migraine) Triggers/exacerbating factors:  Stress, Nexium  Relieving factors:  Clonazepam , Fioricet, sumatriptan  Activity:  Cannot function when severe (3-4 days per month)   Past NSAIDS:  Ibuprofen , naproxen Past analgesics:  Cafergot, Excedrin, Tylenol , Tramadol, Fioricet Past abortive triptans:  sumatriptan  tab Past muscle relaxants:  tizanidine , Flexeril  Past anti-emetic:  Zofran  ODT 8mg  Past antihypertensive medications:  Lopressor , propranolol  Past antidepressant medications:  amitriptyline, Effexor, Cymbalta , Zoloft Past anticonvulsant medications:  Topiramate, gabapentin  300mg  twice daily Past CGRP-inhibitor:  Aimovig  140mg  Past vitamins/Herbal/Supplements:  CoQ10 Other past therapies:  yoga, physical therapy   Family history of headache:  Father, sister, brother  Memory Complaints She mentioned trouble with short term memory.  Neuropsychological evaluation on 03/23/2021 was within normal limits and likely he memory complaints are related to PTSD, chronic pain and poor sleep.     MRI and MRA of brain from 07/23/11 were personally reviewed  and unremarkable.  PAST MEDICAL HISTORY: Past Medical History:  Diagnosis Date   Adjustment disorder with depressed mood 03/18/2007   Allergy    Anal fissure 10/08/2016   Angina    takes Propanolol prn and it stops her chest   Anorexia nervosa 11/11/2019   Barrett esophagus    not consistently present   Cataract    removed  but had retinal detachment - repeat cataract right eye   Cervical facet joint syndrome 10/08/2016   Chronic constipation 10/04/2010   Chronic interstitial cystitis 10/16/2011   Chronic kidney disease    kidney stones and UTI   Chronic maxillary sinusitis 09/15/2013   Chronic narcotic dependence 10/03/2018   Complication of anesthesia    either  BP or pulse dropped with last surgery   Degeneration of cervical intervertebral disc 03/18/2007   Degeneration of lumbar or lumbosacral intervertebral disc 03/18/2007   Diaphragmatic hernia 03/18/2007   Dysrhythmia    brought on by stress   Epigastric pain 11/11/2019   Esophageal dysphagia 10/03/2018   External hemorrhoids    Fibromyalgia    neuropathy knees ankles and toes, bladder   Generalized anxiety disorder 03/18/2007   GERD (gastroesophageal reflux disease) 03/18/2007   HA (headache)-chronic/tension type 07/28/2012   Heart murmur    Hiatal hernia    History of anemia    History of gastric bypass 02/11/2014   [facility] at [facility]   History of hyperparathyroidism 01/22/2016   History of kidney stones  Hypertension    Hypoglycemia 11/08/2016   IBS (irritable bowel syndrome)    Internal hemorrhoids    LLQ abdominal pain 11/11/2019   Macular degeneration    Malnutrition 11/11/2019   Mechanical breakdown implanted electronic neurostimulator spinal cord 11/16/2018   Mitral regurgitation 12/11/2015   MVP (mitral valve prolapse) 12/17/2017   Myalgia 03/18/2007   Nephrolithiasis 07/28/2012   lithotrip 2013-[place],MD   Neuropathic pain 01/23/2018   Orthostatic hypotension 11/20/2016   Osteoarthritis     all over   Osteopetrosis    Osteoporosis 07/28/2012   Peripheral vascular disease    superficial phlebitis in left calf   Personal history of colonic polyps 07/22/2013   07/2013 - 3 small adenomas - repeat colonoscopy 2018   PTSD (post-traumatic stress disorder)    Rosacea 11/02/2017   Small intestinal bacterial overgrowth 08/05/2017   + lactulose breath test 06/2017 Xifaxan  Rx   Type II diabetes mellitus 11/18/2016   Urinary tract infection     MEDICATIONS: Current Outpatient Medications on File Prior to Visit  Medication Sig Dispense Refill   aspirin-acetaminophen -caffeine  (EXCEDRIN MIGRAINE) 250-250-65 MG tablet Take 2 tablets by mouth 2 (two) times daily.     Biotin 1000 MCG CHEW Chew 1 tablet by mouth daily.     clonazePAM  (KLONOPIN ) 0.5 MG tablet Take 0.25 mg by mouth as needed.     COLLAGEN PO Take by mouth daily.     Continuous Blood Gluc Receiver (DEXCOM G7 RECEIVER) DEVI by Does not apply route.     Copper  Gluconate POWD Take by mouth. (Patient not taking: Reported on 08/28/2022)     cyclobenzaprine  (FLEXERIL ) 10 MG tablet Take by mouth.     cycloSPORINE  (RESTASIS ) 0.05 % ophthalmic emulsion Place 1 drop into both eyes 2 (two) times daily. 5.5 mL 5   diclofenac  Sodium (VOLTAREN ) 1 % GEL Apply 2 to 4 gram to the affected area(s) (painful joints) up to 4 times daily as needed. 600 g 1   dicyclomine  (BENTYL ) 10 MG capsule Take 1-2 tab 3 times daily AC as needed for spasms and cramping. 270 capsule 1   Docusate Sodium  (DSS) 100 MG CAPS Take by mouth.     DULoxetine  (CYMBALTA ) 30 MG capsule Take 1 capsule (30 mg total) by mouth 2 (two) times daily. 180 capsule 3   Erenumab -aooe 70 MG/ML SOAJ Inject into the skin.     famotidine  (PEPCID ) 20 MG tablet Take by mouth.     fentaNYL  (DURAGESIC ) 25 MCG/HR Place 1 patch onto the skin every 3 (three) days. Change every 3 days 10 patch 0   fentaNYL  (DURAGESIC ) 25 MCG/HR Place 1 patch onto the skin every 3 (three) days. change every 3  days 10 patch 0   fentaNYL  (DURAGESIC ) 25 MCG/HR Place 1 patch onto the skin every 3 (three) days. Change every 3 days 10 patch 0   furosemide  (LASIX ) 40 MG tablet Take by mouth.     glucagon  1 MG injection Inject 1 mg into the muscle once as needed for up to 1 dose. 1 each 12   HYDROcodone -acetaminophen  (NORCO) 10-325 MG tablet Take 1/2 - 1 tablet by mouth 4 (four) times daily as needed for pain. Max Daily Dose: 4 tablets. 120 tablet 0   HYDROcodone -acetaminophen  (NORCO) 10-325 MG tablet Take 1/2 to 1 tablet by mouth four times daily, if needed for pain, max daily dose: 4 Tablets 120 tablet 0   HYDROcodone -acetaminophen  (NORCO) 10-325 MG tablet Take 1/2 to 1 tablet four times daily, if needed  for pain, max daily dose: 4 Tablet 120 tablet 0   HYDROcodone -acetaminophen  (NORCO) 10-325 MG tablet Take 0.5-1 tablets by mouth 4 (four) times daily as needed for pain. Max daily dose: 4 tablets. 120 tablet 0   HYDROcodone -acetaminophen  (NORCO) 10-325 MG tablet Take 0.5-1 tablets by mouth 4 (four) times daily as needed for pain. max 4 tabs daily 120 tablet 0   HYDROcodone -acetaminophen  (NORCO) 10-325 MG tablet Take 0.5-1 tablets by mouth 4 (four) times daily as needed for pain. Max 4 tabs daily. 120 tablet 0   HYDROcodone -acetaminophen  (NORCO) 10-325 MG tablet Take 0.5-1 tablets by mouth 4 (four) times daily as needed for pain.  max daily dose: 4 Tablet 120 tablet 0   hydrocortisone cream 1 % Apply topically.     hypromellose (GENTEAL) 0.3 % GEL ophthalmic ointment Place 1 drop into both eyes at bedtime. Reported on 02/11/2015     latanoprost (XALATAN) 0.005 % ophthalmic solution Apply to eye.     lidocaine  (LIDODERM ) 5 % Place 1 patch onto the skin as needed. Remove & Discard patch within 12 hours. May dispense as 3 month supply 90 patch 3   Lifitegrast 5 % SOLN Place 1 drop into both eyes daily.     magnesium  hydroxide (MILK OF MAGNESIA) 400 MG/5ML suspension Take by mouth.     metroNIDAZOLE  (METROGEL ) 1 %  gel Apply topically daily. 45 g 0   mirabegron  ER (MYRBETRIQ ) 25 MG TB24 tablet Take 1 tablet (25 mg total) by mouth daily. 90 tablet 1   naloxegol  oxalate (MOVANTIK ) 25 MG TABS tablet Take 25 mg by mouth daily.     naloxone  (NARCAN ) nasal spray 4 mg/0.1 mL Use one spray if poorly responding or turning blue 2 each 0   Naloxone  HCl 0.4 MG/0.4ML SOAJ Administer 0.4mg  [place] at first sign of opioid overdose and repeat every 2 minutes as needed for resuscitation. Call 911 immediately 5 Package 0   NON FORMULARY [facility]  Peripheral Neuropathy Cream- Bupivacaine  1%, Doxepin 3%, Gabapentin  6%, Pentoxifylline 3%, Topiramate 1% Apply 1-2 grams to affected area 3-4 times daily Qty. 120 gm 3 refills (Patient not taking: Reported on 08/28/2022)     NON FORMULARY GABA6%LIDO5%DICL3% (Patient not taking: Reported on 08/28/2022)     nystatin-triamcinolone  (MYCOLOG II) cream      ondansetron  (ZOFRAN ) 8 MG tablet Take 1 tablet (8 mg total) by mouth every 8 (eight) hours as needed for nausea or vomiting. 60 tablet 0   phenazopyridine  (PYRIDIUM ) 95 MG tablet Take by mouth.     potassium chloride  SA (KLOR-CON  M) 20 MEQ tablet Take by mouth.     pregabalin  (LYRICA ) 50 MG capsule Take 1 capsule in morning and 2 capsules at night 270 capsule 3   Probiotic Product (PROBIOTIC-10 PO) Take by mouth.      rizatriptan  (MAXALT -MLT) 10 MG disintegrating tablet Take 1 tablet (10 mg total) by mouth as needed for migraine. May repeat in 2 hours if needed (Patient not taking: Reported on 08/28/2022) 27 tablet 3   solifenacin  (VESICARE ) 5 MG tablet Take 5 mg by mouth daily.     sucralfate  (CARAFATE ) 1 GM/10ML suspension Take 10 mLs (1 g total) by mouth 4 (four) times daily. 420 mL 1   Vitamin D , Ergocalciferol , (DRISDOL ) 1.25 MG (50000 UNIT) CAPS capsule Take 1 capsule (50,000 Units total) by mouth every 7 (seven) days. 12 capsule 0   No current facility-administered medications on file prior to visit.     ALLERGIES: Allergies  Allergen  Reactions   Ambien [Zolpidem Tartrate]     Hallucinations   Codeine Anaphylaxis   Oxycodone-Acetaminophen  Shortness Of Breath   Tylox [Oxycodone-Acetaminophen ] Anaphylaxis    Chest pain   Zolpidem Tartrate Anaphylaxis and Other (See Comments)    Other Reaction(s): Hallucination  Other reaction(s): Other, hallucinations, Other reaction(s): Other, hallucinations, hallucinations   Celebrex  [Celecoxib ] Other (See Comments)    Stomach cramping   Darvocet [Propoxyphene N-Acetaminophen ]     Chest pain   Darvon [Propoxyphene] Itching   Lorcet [Hydrocodone -Acetaminophen ]     Chest pain   Opium     hallucinations   Vicodin [Hydrocodone -Acetaminophen ] Other (See Comments)    Chest Pain    Wheat    Amoxicillin  Nausea And Vomiting    Reaction:  Migraine headache   Penicillins Nausea And Vomiting    Migraine Headaches    FAMILY HISTORY: Family History  Problem Relation Age of Onset   Stroke Mother    Lung cancer Mother    Heart disease Mother    Diabetes Mother    Hypertension Father    Stroke Father    Heart disease Father    Kidney disease Father    Seizures Father        epilepsy, and sisters x 2   Memory loss Father    Lung cancer Sister    Breast cancer Sister    Cancer - Lung Sister    Heart disease Sister    Diabetes Sister    Pancreatic cancer Sister    Healthy Daughter    Healthy Son    Colon cancer Maternal Aunt        12 relatives   Colon cancer Paternal Aunt    Colon cancer Paternal Aunt    Uterine cancer Other        aunt   Heart disease Other        grandmother   Esophageal cancer Neg Hx    Rectal cancer Neg Hx    Stomach cancer Neg Hx    Colon polyps Neg Hx       Objective:  Blood pressure 113/60, pulse 97, height 5' 5 (1.651 m), weight 106 lb (48.1 kg), SpO2 97%. General: No acute distress.  Patient appears well-groomed.   Head:  Normocephalic/atraumatic Neck:  Supple.  No paraspinal tenderness.  Full  range of motion. Heart:  Regular rate and rhythm. Neuro:  Alert and oriented.  Speech fluent and not dysarthric.  Language intact.  CN II-XII intact.  Bulk and tone normal.  Muscle strength 5/5 throughout.  Sensation to pinprick reduced in fingers and feet up to below knees, vibratory sensation reduced in feet up to below knees.  Deep tendon reflexes 1+ throughout.  Wide-based cautious gait.     Juliene Dunnings, DO   CC:  Almarie Scala, MD

## 2023-02-18 NOTE — Progress Notes (Signed)
Office Visit Note  Patient: Kayla [name]             Date of Birth: March 29, 1945           MRN: [id number]             PCP: Lewis Moccasin, MD Referring: [facility],* Visit Date: 03/04/2023 Occupation: @GUAROCC @  Subjective:  Pain in multiple joints and muscles  History of Present Illness: Kayla [name] is a 78 y.o. female with osteoporosis, osteoarthritis, degenerative disc disease and fibromyalgia syndrome.  She is been on Prolia injections since May 2022.  She has been tolerating Prolia injections without any side effects.  Respiratory injection was in September 2024.  She has been also taking calcium and vitamin D.  She states she had some recent labs done by Dr. Ranae Palms which showed vitamin D deficiency.  She plans to start on vitamin D.  She also was diagnosed with bilateral kidney stones recently and will be undergoing lithotripsy.  She continues to have pain and discomfort in her neck and lower back.  Has ongoing pain in bilateral trochanteric bursa.  She also has generalized pain from fibromyalgia.  She has been going to pain management.    Activities of Daily Living:  Patient reports morning stiffness for 15 minutes.   Patient Reports nocturnal pain.  Difficulty dressing/grooming: Denies Difficulty climbing stairs: Reports Difficulty getting out of chair: Reports Difficulty using hands for taps, buttons, cutlery, and/or writing: Reports  Review of Systems  Constitutional:  Positive for fatigue.  HENT:  Positive for mouth dryness. Negative for mouth sores.   Eyes:  Positive for dryness.  Respiratory:  Negative for shortness of breath and difficulty breathing.   Cardiovascular:  Negative for palpitations.  Gastrointestinal:  Positive for constipation. Negative for blood in stool and diarrhea.  Endocrine: Positive for increased urination.  Genitourinary:  Positive for involuntary urination.  Musculoskeletal:  Positive for joint pain, gait problem, joint  pain, myalgias, muscle weakness, morning stiffness, muscle tenderness and myalgias. Negative for joint swelling.  Skin:  Positive for hair loss. Negative for color change, rash and sensitivity to sunlight.  Allergic/Immunologic: Positive for susceptible to infections.  Neurological:  Positive for headaches. Negative for dizziness.  Hematological:  Negative for swollen glands.  Psychiatric/Behavioral:  Positive for depressed mood and sleep disturbance. The patient is nervous/anxious.     PMFS History:  Patient Active Problem List   Diagnosis Date Noted   Recurrent UTI 07/13/2022   Iron deficiency anemia due to dietary causes 04/18/2021   Peripheral neuropathy 04/03/2021   Chronic kidney disease 03/22/2021   Macular degeneration 03/22/2021   PTSD (post-traumatic stress disorder)    Epigastric pain 11/11/2019   Anorexia nervosa 11/11/2019   LLQ abdominal pain 11/11/2019   Malnutrition 11/11/2019   Mechanical breakdown implanted electronic neurostimulator spinal cord 11/16/2018   Esophageal dysphagia 10/03/2018   Neuropathic pain 01/23/2018   MVP (mitral valve prolapse) 12/17/2017   Rosacea 11/02/2017   Small intestinal bacterial overgrowth 08/05/2017   Orthostatic hypotension 11/20/2016   Type II diabetes mellitus 11/18/2016   Hypoglycemia 11/08/2016   Cervical facet joint syndrome 10/08/2016   Anal fissure 10/08/2016   Peripheral vascular disease 01/22/2016   Hypertension 01/22/2016   History of hyperparathyroidism 01/22/2016   Mitral regurgitation 12/11/2015   History of gastric bypass 02/11/2014   Chronic maxillary sinusitis 09/15/2013   History of colonic polyps 07/22/2013   HA (headache)-chronic/tension type 07/28/2012   Osteoporosis 07/28/2012   Nephrolithiasis 07/28/2012  Fibromyalgia 07/26/2012   Chronic interstitial cystitis 10/16/2011   Chronic constipation 10/04/2010   Loss of weight 11/27/2009   Generalized anxiety disorder 03/18/2007   Adjustment disorder  with depressed mood 03/18/2007   GERD (gastroesophageal reflux disease) 03/18/2007   Diaphragmatic hernia 03/18/2007   Osteoarthritis 03/18/2007   Degeneration of cervical intervertebral disc 03/18/2007   Degeneration of lumbar or lumbosacral intervertebral disc 03/18/2007   Myalgia 03/18/2007    Past Medical History:  Diagnosis Date   Adjustment disorder with depressed mood 03/18/2007   Allergy    Anal fissure 10/08/2016   Angina    takes Propanolol prn and it stops her chest   Anorexia nervosa 11/11/2019   Barrett esophagus    not consistently present   Cataract    removed  but had retinal detachment - repeat cataract right eye   Cervical facet joint syndrome 10/08/2016   Chronic constipation 10/04/2010   Chronic interstitial cystitis 10/16/2011   Chronic kidney disease    kidney stones and UTI   Chronic maxillary sinusitis 09/15/2013   Chronic narcotic dependence 10/03/2018   Complication of anesthesia    either  BP or pulse dropped with last surgery   Degeneration of cervical intervertebral disc 03/18/2007   Degeneration of lumbar or lumbosacral intervertebral disc 03/18/2007   Diaphragmatic hernia 03/18/2007   Dysrhythmia    brought on by stress   Epigastric pain 11/11/2019   Esophageal dysphagia 10/03/2018   External hemorrhoids    Fibromyalgia    neuropathy knees ankles and toes, bladder   Generalized anxiety disorder 03/18/2007   GERD (gastroesophageal reflux disease) 03/18/2007   HA (headache)-chronic/tension type 07/28/2012   Heart murmur    Hiatal hernia    History of anemia    History of gastric bypass 02/11/2014   1999 at [facility]   History of hyperparathyroidism 01/22/2016   History of kidney stones    Hypertension    Hypoglycemia 11/08/2016   IBS (irritable bowel syndrome)    Internal hemorrhoids    Kidney stones    multiple per patient   LLQ abdominal pain 11/11/2019   Macular degeneration    Malnutrition 11/11/2019   Mechanical breakdown  implanted electronic neurostimulator spinal cord 11/16/2018   Mitral regurgitation 12/11/2015   MVP (mitral valve prolapse) 12/17/2017   Myalgia 03/18/2007   Nephrolithiasis 07/28/2012   lithotrip 2013-[place]skridge,MD   Neuropathic pain 01/23/2018   Orthostatic hypotension 11/20/2016   Osteoarthritis    all over   Osteopetrosis    Osteoporosis 07/28/2012   Peripheral vascular disease    superficial phlebitis in left calf   Personal history of colonic polyps 07/22/2013   07/2013 - 3 small adenomas - repeat colonoscopy 2018   PTSD (post-traumatic stress disorder)    Rosacea 11/02/2017   Small intestinal bacterial overgrowth 08/05/2017   + lactulose breath test 06/2017 Xifaxan Rx   Type II diabetes mellitus 11/18/2016   Urinary tract infection     Family History  Problem Relation Age of Onset   Stroke Mother    Lung cancer Mother    Heart disease Mother    Diabetes Mother    Hypertension Father    Stroke Father    Heart disease Father    Kidney disease Father    Seizures Father        epilepsy, and sisters x 2   Memory loss Father    Lung cancer Sister    Breast cancer Sister    Cancer - Lung Sister  Heart disease Sister    Diabetes Sister    Pancreatic cancer Sister    Healthy Daughter    Healthy Son    Colon cancer Maternal Aunt        12 relatives   Colon cancer Paternal Aunt    Colon cancer Paternal Aunt    Uterine cancer Other        aunt   Heart disease Other        grandmother   Esophageal cancer Neg Hx    Rectal cancer Neg Hx    Stomach cancer Neg Hx    Colon polyps Neg Hx    Past Surgical History:  Procedure Laterality Date   ABDOMINAL HYSTERECTOMY     APPENDECTOMY     CARDIAC CATHETERIZATION  03/22/1991   EF 61%   CARDIOVASCULAR STRESS TEST  10/03/2006   CERVICAL SPINE SURGERY  02/14/2022   BLOCK   CHOLECYSTECTOMY     COLONOSCOPY  06/23/2008   normal   DILATION AND CURETTAGE OF UTERUS     x2   ESOPHAGUS SURGERY     EXPLORATORY LAPAROTOMY   1993   EYE SURGERY     GASTRIC BYPASS  1999   GASTRIC RESTRICTION SURGERY  1991   LASIK Bilateral    Right Arm Surgery     Right Knee Arthroscopy     Rt wrist fx  2009   spinal surgery battery implant     stomach stappeling  1991   STONE EXTRACTION WITH BASKET  2012   TONSILLECTOMY     TUBAL LIGATION     UPPER GASTROINTESTINAL ENDOSCOPY  06/23/2008   w/Dil, Barrett's esophagus   US ECHOCARDIOGRAPHY  01/21/2007   EF 55-60%   US ECHOCARDIOGRAPHY  08/30/2003   EF 55-60%   Social History   Social History Narrative   Right handed   Two story home   She retired on disability   Married   2 Children   Immunization History  Administered Date(s) Administered   Hepatitis B, ADULT 04/21/2017   Influenza Split 10/02/2011   Influenza Whole 08/21/2007   Influenza, High Dose Seasonal PF 12/29/2017   Influenza,inj,Quad PF,6+ Mos 12/15/2013, 11/23/2014, 09/27/2015, 10/04/2016   Influenza-Unspecified 12/15/2013, 11/23/2014, 09/27/2015, 10/04/2016   PFIZER(Purple Top)SARS-COV-2 Vaccination 03/31/2019, 04/20/2019   Pneumococcal Conjugate-13 09/05/2014   Pneumococcal Polysaccharide-23 10/02/2011, 02/08/2013   Td 01/14/2006   Tdap 01/22/2016     Objective: Vital Signs: BP 116/72 (BP Location: Left Arm, Patient Position: Sitting, Cuff Size: Normal)   Pulse 99   Resp 12   Ht 5\' 5"  (1.651 m)   Wt 111 lb 9.6 oz (50.6 kg)   BMI 18.57 kg/m    Physical Exam Vitals and nursing note reviewed.  Constitutional:      Appearance: She is well-developed.  HENT:     Head: Normocephalic and atraumatic.  Eyes:     Conjunctiva/sclera: Conjunctivae normal.  Cardiovascular:     Rate and Rhythm: Normal rate and regular rhythm.     Heart sounds: Normal heart sounds.  Pulmonary:     Effort: Pulmonary effort is normal.     Breath sounds: Normal breath sounds.  Abdominal:     General: Bowel sounds are normal.     Palpations: Abdomen is soft.  Musculoskeletal:     Cervical back: Normal range of  motion.  Lymphadenopathy:     Cervical: No cervical adenopathy.  Skin:    General: Skin is warm and dry.     Capillary Refill: Capillary refill  takes less than 2 seconds.  Neurological:     Mental Status: She is alert and oriented to person, place, and time.  Psychiatric:        Behavior: Behavior normal.      Musculoskeletal Exam: Limited lateral rotation of the cervical spine with some discomfort.  She had discomfort with range of motion of thoracic and lumbar spine.  There was no point tenderness over thoracic or lumbar spine.  Thoracic kyphosis was noted.  Shoulder joints, elbow joints, wrist joints, MCPs PIPs and DIPs with good range of motion with no synovitis.  Bilateral PIP and DIP thickening was noted.  She has some tenderness over bilateral trochanteric region.  Hip joints and knee joints in good range of motion.  There was no tenderness over ankles or MTPs.  CDAI Exam: CDAI Score: -- Patient Global: --; Provider Global: -- Swollen: --; Tender: -- Joint Exam 03/04/2023   No joint exam has been documented for this visit   There is currently no information documented on the homunculus. Go to the Rheumatology activity and complete the homunculus joint exam.  Investigation: No additional findings.  Imaging: No results found.  Recent Labs: Lab Results  Component Value Date   WBC 7.1 12/05/2022   HGB 12.8 12/05/2022   PLT 281 12/05/2022   NA 141 08/28/2022   K 5.4 (H) 08/28/2022   CL 106 08/28/2022   CO2 30 08/28/2022   GLUCOSE 90 08/28/2022   BUN 28 (H) 08/28/2022   CREATININE 0.81 08/28/2022   BILITOT 0.5 08/28/2022   ALKPHOS 66 06/12/2022   AST 32 08/28/2022   ALT 33 (H) 08/28/2022   PROT 5.2 (L) 08/28/2022   ALBUMIN 3.3 (L) 06/12/2022   CALCIUM 9.4 08/28/2022   GFRAA 95 05/05/2020    Speciality Comments: DEXA [facility] Fosamax 2013-2018 Patient declined Tymlos and Forteo-travels frequently. Prolia May 26, 2020, January 26, 2021  Procedures:   No procedures performed Allergies: Ambien [zolpidem tartrate], Codeine, Oxycodone-acetaminophen, Tylox [oxycodone-acetaminophen], Zolpidem tartrate, Celebrex [celecoxib], Darvocet [propoxyphene n-acetaminophen], Darvon [propoxyphene], Lorcet [hydrocodone-acetaminophen], Opium, Vicodin [hydrocodone-acetaminophen], Wheat, Amoxicillin, and Penicillins   Assessment / Plan:     Visit Diagnoses: Age-related osteoporosis without current pathological fracture - DEXA on 01/11/20: BMD measured at AP spine L1-L4 is 0.723 with a T-score of -3.8.  Repeat DEXA scan this is scheduled in March per patient.  She has been on Prolia injections since May 2022.  She has been getting Prolia every 6 months without any interruption.  She has been tolerating Prolia well.  She also takes calcium and vitamin D.  Vitamin D deficiency-she had vitamin D deficiency in the past.  Repeat vitamin D was normal 89 on August 28, 2022.  Patient states she had recent vitamin D done by Dr. Ranae Palms which was low.  I do not have the results available.  Patient will forward labs to [place].  Medication management - Current therapy: Prolia injections: 01/26/2021,05/26/20, 01/26/2021, 08/03/2021, 03/29/2022, 10/08/2022.  Next Prolia will be scheduled in March.  DDD (degenerative disc disease), cervical-she continues to have some pain and stiffness in the cervical spine.  Range of motion exercises were discussed.  Trapezius muscle spasm-related to disc disease.  Spondylosis of lumbar spine -she complains of chronic discomfort in her lower back.  She is on medications prescribed by pain management.  Trochanteric bursitis of both hips-IT band stretches were advised.  Fibromyalgia-she can history of generalized pain and discomfort.  She also has positive tender points.  Need for regular exercise  and stretching was emphasized.  Chronic pain syndrome -she is followed at pain management.  She uses fentanyl patches, and hydrocodone for pain relief.  She is  also taking Lyrica as prescribed.  Muscle cramps-stretching exercises were advised.  She takes baclofen as needed.  Other medical problems are listed as follows:  Anxiety and depression-she is on Cymbalta.  Other fatigue  Kidney stones-patient was diagnosed with bilateral kidney stones.  Patient states she will be undergoing lithotripsy.  I advised patient to discuss the analysis of kidney stone with the urologist.  IC (interstitial cystitis)  Hypoglycemia  History of gastroesophageal reflux (GERD)  Orders: Orders Placed This Encounter  Procedures   COMPLETE METABOLIC PANEL WITH GFR   VITAMIN D 25 Hydroxy (Vit-D Deficiency, Fractures)   No orders of the defined types were placed in this encounter.    Follow-Up Instructions: Return in about 6 months (around 09/01/2023) for Osteoporosis.   Pollyann Savoy, MD  Note - This record has been created using Animal nutritionist.  Chart creation errors have been sought, but may not always  have been located. Such creation errors do not reflect on  the standard of medical care.

## 2023-02-19 ENCOUNTER — Encounter: Payer: Self-pay | Admitting: Neurology

## 2023-02-19 ENCOUNTER — Ambulatory Visit (INDEPENDENT_AMBULATORY_CARE_PROVIDER_SITE_OTHER): Payer: Medicare Other | Admitting: Neurology

## 2023-02-19 ENCOUNTER — Other Ambulatory Visit: Payer: Self-pay | Admitting: Hematology and Oncology

## 2023-02-19 ENCOUNTER — Inpatient Hospital Stay: Payer: Medicare Other | Attending: Internal Medicine

## 2023-02-19 VITALS — BP 113/60 | HR 97 | Ht 65.0 in | Wt 106.0 lb

## 2023-02-19 DIAGNOSIS — G894 Chronic pain syndrome: Secondary | ICD-10-CM

## 2023-02-19 DIAGNOSIS — G43009 Migraine without aura, not intractable, without status migrainosus: Secondary | ICD-10-CM

## 2023-02-19 DIAGNOSIS — G609 Hereditary and idiopathic neuropathy, unspecified: Secondary | ICD-10-CM | POA: Diagnosis not present

## 2023-02-19 DIAGNOSIS — D508 Other iron deficiency anemias: Secondary | ICD-10-CM

## 2023-02-19 MED ORDER — BACLOFEN 5 MG PO TABS: 1.0000 | ORAL_TABLET | Freq: Three times a day (TID) | ORAL | 5 refills | Status: AC | PRN

## 2023-02-19 MED ORDER — DULOXETINE HCL 30 MG PO CPEP: 90.0000 mg | ORAL_CAPSULE | Freq: Every day | ORAL | 5 refills | Status: DC

## 2023-02-19 MED ORDER — PREGABALIN 50 MG PO CAPS: ORAL_CAPSULE | ORAL | 3 refills | Status: DC

## 2023-02-19 MED ORDER — EMGALITY 120 MG/ML ~~LOC~~ SOAJ: 120.0000 mg | SUBCUTANEOUS | 3 refills | Status: AC

## 2023-02-19 NOTE — Patient Instructions (Signed)
 Continue Emgality  and pregablin Change duloxetine  to 90mg  once daily Stop Excedrin.  Take baclofen  as needed for headaches but still limit to no more than 10 days out of the month. Wear elbow pad and keep arm straight

## 2023-03-04 ENCOUNTER — Encounter: Payer: Self-pay | Admitting: Rheumatology

## 2023-03-04 ENCOUNTER — Ambulatory Visit: Payer: Medicare Other | Attending: Rheumatology | Admitting: Rheumatology

## 2023-03-04 VITALS — BP 116/72 | HR 99 | Resp 12 | Ht 65.0 in | Wt 111.6 lb

## 2023-03-04 DIAGNOSIS — N3010 Interstitial cystitis (chronic) without hematuria: Secondary | ICD-10-CM | POA: Diagnosis present

## 2023-03-04 DIAGNOSIS — Z8719 Personal history of other diseases of the digestive system: Secondary | ICD-10-CM

## 2023-03-04 DIAGNOSIS — M47816 Spondylosis without myelopathy or radiculopathy, lumbar region: Secondary | ICD-10-CM

## 2023-03-04 DIAGNOSIS — R252 Cramp and spasm: Secondary | ICD-10-CM | POA: Diagnosis present

## 2023-03-04 DIAGNOSIS — R5383 Other fatigue: Secondary | ICD-10-CM | POA: Diagnosis present

## 2023-03-04 DIAGNOSIS — F32A Depression, unspecified: Secondary | ICD-10-CM

## 2023-03-04 DIAGNOSIS — E162 Hypoglycemia, unspecified: Secondary | ICD-10-CM

## 2023-03-04 DIAGNOSIS — N200 Calculus of kidney: Secondary | ICD-10-CM | POA: Diagnosis present

## 2023-03-04 DIAGNOSIS — Z79899 Other long term (current) drug therapy: Secondary | ICD-10-CM | POA: Diagnosis present

## 2023-03-04 DIAGNOSIS — M5030 Other cervical disc degeneration, unspecified cervical region: Secondary | ICD-10-CM

## 2023-03-04 DIAGNOSIS — M7061 Trochanteric bursitis, right hip: Secondary | ICD-10-CM | POA: Diagnosis present

## 2023-03-04 DIAGNOSIS — F419 Anxiety disorder, unspecified: Secondary | ICD-10-CM | POA: Diagnosis present

## 2023-03-04 DIAGNOSIS — G894 Chronic pain syndrome: Secondary | ICD-10-CM

## 2023-03-04 DIAGNOSIS — M62838 Other muscle spasm: Secondary | ICD-10-CM | POA: Diagnosis present

## 2023-03-04 DIAGNOSIS — M797 Fibromyalgia: Secondary | ICD-10-CM

## 2023-03-04 DIAGNOSIS — M810 Age-related osteoporosis without current pathological fracture: Secondary | ICD-10-CM | POA: Diagnosis present

## 2023-03-04 DIAGNOSIS — E559 Vitamin D deficiency, unspecified: Secondary | ICD-10-CM

## 2023-03-04 DIAGNOSIS — M7062 Trochanteric bursitis, left hip: Secondary | ICD-10-CM | POA: Diagnosis present

## 2023-03-07 ENCOUNTER — Other Ambulatory Visit: Payer: Self-pay | Admitting: Urology

## 2023-03-11 ENCOUNTER — Encounter: Payer: Self-pay | Admitting: Neurology

## 2023-03-13 ENCOUNTER — Other Ambulatory Visit: Payer: Self-pay | Admitting: Neurology

## 2023-03-14 ENCOUNTER — Other Ambulatory Visit (HOSPITAL_BASED_OUTPATIENT_CLINIC_OR_DEPARTMENT_OTHER): Payer: Self-pay

## 2023-03-14 ENCOUNTER — Other Ambulatory Visit: Payer: Self-pay | Admitting: Nurse Practitioner

## 2023-03-14 DIAGNOSIS — M79629 Pain in unspecified upper arm: Secondary | ICD-10-CM

## 2023-03-14 MED ORDER — FENTANYL 25 MCG/HR TD PT72
1.0000 | MEDICATED_PATCH | TRANSDERMAL | 0 refills | Status: DC
Start: 2023-03-14 — End: 2023-04-22
  Filled 2023-03-17: qty 10, 30d supply, fill #0

## 2023-03-14 MED ORDER — HYDROCODONE-ACETAMINOPHEN 10-325 MG PO TABS
0.5000 | ORAL_TABLET | Freq: Four times a day (QID) | ORAL | 0 refills | Status: DC | PRN
Start: 2023-03-14 — End: 2023-04-22
  Filled 2023-03-17: qty 120, 30d supply, fill #0

## 2023-03-17 ENCOUNTER — Other Ambulatory Visit: Payer: Self-pay

## 2023-03-17 ENCOUNTER — Other Ambulatory Visit (HOSPITAL_BASED_OUTPATIENT_CLINIC_OR_DEPARTMENT_OTHER): Payer: Self-pay

## 2023-03-17 NOTE — Progress Notes (Signed)
 COVID Vaccine Completed:  Yes  Date of COVID positive in last 90 days:  PCP - Maryelizabeth Rowan, MD Cardiologist - Kristeen Miss, MD Endocrinologist - Ivar Drape, MD  Chest x-ray -  EKG -  Stress Test - 07-20-20 Epic ECHO - 04-09-22 Epic Cardiac Cath - Yes Pacemaker/ICD device last checked: Spinal Cord Stimulator: Telemetry monitor - 07-15-14 Epic  Bowel Prep -   Sleep Study -  CPAP -   Type 1 diabetic Fasting Blood Sugar -  Checks Blood Sugar _____ times a day  Last dose of GLP1 agonist-  N/A GLP1 instructions:  Hold 7 days before surgery    Last dose of SGLT-2 inhibitors-  N/A SGLT-2 instructions:  Hold 3 days before surgery    Blood Thinner Instructions:  Last dose:   Time: Aspirin Instructions: Last Dose:  Activity level:  Can go up a flight of stairs and perform activities of daily living without stopping and without symptoms of chest pain or shortness of breath.  Able to exercise without symptoms  Unable to go up a flight of stairs without symptoms of     Anesthesia review:  MVP, HTN, PVD, CKD, Type 1 DM  Patient denies shortness of breath, fever, cough and chest pain at PAT appointment  Patient verbalized understanding of instructions that were given to them at the PAT appointment. Patient was also instructed that they will need to review over the PAT instructions again at home before surgery.

## 2023-03-17 NOTE — Patient Instructions (Addendum)
 SURGICAL WAITING ROOM VISITATION Patients having surgery or a procedure may have no more than 2 support people in the waiting area - these visitors may rotate.    Children under the age of 55 must have an adult with them who is not the patient.  Due to an increase in RSV and influenza rates and associated hospitalizations, children ages 42 and under may not visit patients in [facility] hospitals.   If the patient needs to stay at the hospital during part of their recovery, the visitor guidelines for inpatient rooms apply. Pre-op nurse will coordinate an appropriate time for 1 support person to accompany patient in pre-op.  This support person may not rotate.    Please refer to the [facility] website for the visitor guidelines for Inpatients (after your surgery is over and you are in a regular room).       Your procedure is scheduled on: 03-24-23   Report to [facility] Main Entrance    Report to admitting at 11:30 AM   Call this number if you have problems the morning of surgery [phone]   Do not eat food or drink liquids :After Midnight.          If you have questions, please contact your surgeon's office.   FOLLOW  ANY ADDITIONAL PRE OP INSTRUCTIONS YOU RECEIVED FROM YOUR SURGEON'S OFFICE!!!     Oral Hygiene is also important to reduce your risk of infection.                                    Remember - BRUSH YOUR TEETH THE MORNING OF SURGERY WITH YOUR REGULAR TOOTHPASTE   Do NOT smoke after Midnight   Take these medicines the morning of surgery with A SIP OF WATER:    Duloxetine   Famotidine   Pregabalin   If needed Hydrocodone, Ondansetron, Pyridium   Okay to use eyedrops  Stop all vitamins and herbal supplements 7 days before surgery  How to Manage Your Diabetes Before and After Surgery  Why is it important to control my blood sugar before and after surgery? Improving blood sugar levels before and after surgery helps healing and can limit  problems. A way of improving blood sugar control is eating a healthy diet by:  Eating less sugar and carbohydrates  Increasing activity/exercise  Talking with your doctor about reaching your blood sugar goals High blood sugars (greater than 180 mg/dL) can raise your risk of infections and slow your recovery, so you will need to focus on controlling your diabetes during the weeks before surgery. Make sure that the doctor who takes care of your diabetes knows about your planned surgery including the date and location.  How do I manage my blood sugar before surgery? Check your blood sugar at least 4 times a day, starting 2 days before surgery, to make sure that the level is not too high or low. Check your blood sugar the morning of your surgery when you wake up and every 2 hours until you get to the Short Stay unit. If your blood sugar is less than 70 mg/dL, you will need to treat for low blood sugar: Do not take insulin. Treat a low blood sugar (less than 70 mg/dL) with  cup of clear juice (cranberry or apple), 4 glucose tablets, OR glucose gel. Recheck blood sugar in 15 minutes after treatment (to make sure it is greater than  70 mg/dL). If your blood sugar is not greater than 70 mg/dL on recheck, call [phone] for further instructions. Report your blood sugar to the short stay nurse when you get to Short Stay.  If you are admitted to the hospital after surgery: Your blood sugar will be checked by the staff and you will probably be given insulin after surgery (instead of oral diabetes medicines) to make sure you have good blood sugar levels. The goal for blood sugar control after surgery is 80-180 mg/dL.   Reviewed and Endorsed by [facility] Patient Education Committee, August 2015  Bring CPAP mask and tubing day of surgery.                              You may not have any metal on your body including hair pins, jewelry, and body piercing             Do not wear make-up, lotions,  powders, perfumes or deodorant  Do not wear nail polish including gel and S&S, artificial/acrylic nails, or any other type of covering on natural nails including finger and toenails. If you have artificial nails, gel coating, etc. that needs to be removed by a nail salon please have this removed prior to surgery or surgery may need to be canceled/ delayed if the surgeon/ anesthesia feels like they are unable to be safely monitored.   Do not shave  48 hours prior to surgery.     Do not bring valuables to the hospital. [facility] IS NOT RESPONSIBLE   FOR VALUABLES.   Contacts, dentures or bridgework may not be worn into surgery.  DO NOT BRING YOUR HOME MEDICATIONS TO THE HOSPITAL. PHARMACY WILL DISPENSE MEDICATIONS LISTED ON YOUR MEDICATION LIST TO YOU DURING YOUR ADMISSION IN THE HOSPITAL!    Patients discharged on the day of surgery will not be allowed to drive home.  Someone NEEDS to stay with you for the first 24 hours after anesthesia.   Special Instructions: Bring a copy of your healthcare power of attorney and living will documents the day of surgery if you haven't scanned them before.              Please read over the following fact sheets you were given: IF YOU HAVE QUESTIONS ABOUT YOUR PRE-OP INSTRUCTIONS PLEASE CALL [phone] [facility]  If you received a COVID test during your pre-op visit  it is requested that you wear a mask when out in public, stay away from anyone that may not be feeling well and notify your surgeon if you develop symptoms. If you test positive for Covid or have been in contact with anyone that has tested positive in the last 10 days please notify you surgeon.  [place] - Preparing for Surgery Before surgery, you can play an important role.  Because skin is not sterile, your skin needs to be as free of germs as possible.  You can reduce the number of germs on your skin by washing with CHG (chlorahexidine gluconate) soap before surgery.  CHG is an antiseptic  cleaner which kills germs and bonds with the skin to continue killing germs even after washing. Please DO NOT use if you have an allergy to CHG or antibacterial soaps.  If your skin becomes reddened/irritated stop using the CHG and inform your nurse when you arrive at Short Stay. Do not shave (including legs and underarms) for at least 48 hours prior to the  first CHG shower.  You may shave your face/neck.  Please follow these instructions carefully:  1.  Shower with CHG Soap the night before surgery and the  morning of surgery.  2.  If you choose to wash your hair, wash your hair first as usual with your normal  shampoo.  3.  After you shampoo, rinse your hair and body thoroughly to remove the shampoo.                             4.  Use CHG as you would any other liquid soap.  You can apply chg directly to the skin and wash.  Gently with a scrungie or clean washcloth.  5.  Apply the CHG Soap to your body ONLY FROM THE NECK DOWN.   Do   not use on face/ open                           Wound or open sores. Avoid contact with eyes, ears mouth and   genitals (private parts).                       Wash face,  Genitals (private parts) with your normal soap.             6.  Wash thoroughly, paying special attention to the area where your    surgery  will be performed.  7.  Thoroughly rinse your body with warm water from the neck down.  8.  DO NOT shower/wash with your normal soap after using and rinsing off the CHG Soap.                9.  Pat yourself dry with a clean towel.            10.  Wear clean pajamas.            11.  Place clean sheets on your bed the night of your first shower and do not  sleep with pets. Day of Surgery : Do not apply any lotions/deodorants the morning of surgery.  Please wear clean clothes to the hospital/surgery center.  FAILURE TO FOLLOW THESE INSTRUCTIONS MAY RESULT IN THE CANCELLATION OF YOUR SURGERY  PATIENT SIGNATURE_________________________________  NURSE  SIGNATURE__________________________________  ________________________________________________________________________

## 2023-03-18 ENCOUNTER — Other Ambulatory Visit: Payer: Self-pay

## 2023-03-18 ENCOUNTER — Ambulatory Visit: Admitting: Cardiovascular Disease

## 2023-03-18 ENCOUNTER — Encounter (HOSPITAL_COMMUNITY)
Admission: RE | Admit: 2023-03-18 | Discharge: 2023-03-18 | Disposition: A | Discharge status: Home / Self Care | Source: Ambulatory Visit | Attending: Urology | Admitting: Urology

## 2023-03-18 ENCOUNTER — Encounter (HOSPITAL_COMMUNITY): Payer: Self-pay

## 2023-03-18 ENCOUNTER — Encounter (HOSPITAL_COMMUNITY): Admission: RE | Admit: 2023-03-18 | Source: Ambulatory Visit

## 2023-03-18 VITALS — BP 128/73 | HR 93 | Temp 98.4°F | Resp 16 | Ht 65.0 in | Wt 105.0 lb

## 2023-03-18 DIAGNOSIS — E1151 Type 2 diabetes mellitus with diabetic peripheral angiopathy without gangrene: Secondary | ICD-10-CM | POA: Diagnosis not present

## 2023-03-18 DIAGNOSIS — E109 Type 1 diabetes mellitus without complications: Secondary | ICD-10-CM

## 2023-03-18 DIAGNOSIS — Z01818 Encounter for other preprocedural examination: Secondary | ICD-10-CM | POA: Diagnosis present

## 2023-03-18 DIAGNOSIS — G8929 Other chronic pain: Secondary | ICD-10-CM | POA: Insufficient documentation

## 2023-03-18 DIAGNOSIS — E1122 Type 2 diabetes mellitus with diabetic chronic kidney disease: Secondary | ICD-10-CM | POA: Diagnosis not present

## 2023-03-18 DIAGNOSIS — N200 Calculus of kidney: Secondary | ICD-10-CM | POA: Insufficient documentation

## 2023-03-18 DIAGNOSIS — I341 Nonrheumatic mitral (valve) prolapse: Secondary | ICD-10-CM | POA: Diagnosis not present

## 2023-03-18 DIAGNOSIS — I129 Hypertensive chronic kidney disease with stage 1 through stage 4 chronic kidney disease, or unspecified chronic kidney disease: Secondary | ICD-10-CM | POA: Diagnosis not present

## 2023-03-18 DIAGNOSIS — Z79899 Other long term (current) drug therapy: Secondary | ICD-10-CM | POA: Diagnosis not present

## 2023-03-18 DIAGNOSIS — N189 Chronic kidney disease, unspecified: Secondary | ICD-10-CM | POA: Diagnosis not present

## 2023-03-18 DIAGNOSIS — Z9884 Bariatric surgery status: Secondary | ICD-10-CM | POA: Insufficient documentation

## 2023-03-18 DIAGNOSIS — D631 Anemia in chronic kidney disease: Secondary | ICD-10-CM | POA: Diagnosis not present

## 2023-03-18 DIAGNOSIS — D649 Anemia, unspecified: Secondary | ICD-10-CM

## 2023-03-18 DIAGNOSIS — Z9682 Presence of neurostimulator: Secondary | ICD-10-CM | POA: Diagnosis not present

## 2023-03-18 DIAGNOSIS — Z79891 Long term (current) use of opiate analgesic: Secondary | ICD-10-CM | POA: Diagnosis not present

## 2023-03-18 DIAGNOSIS — I340 Nonrheumatic mitral (valve) insufficiency: Secondary | ICD-10-CM | POA: Diagnosis not present

## 2023-03-18 HISTORY — DX: Other hypoglycemia: E16.1

## 2023-03-18 LAB — CBC
HCT: 38.6 % (ref 36.0–46.0)
Hemoglobin: 12.5 g/dL (ref 12.0–15.0)
MCH: 31.3 pg (ref 26.0–34.0)
MCHC: 32.4 g/dL (ref 30.0–36.0)
MCV: 96.7 fL (ref 80.0–100.0)
Platelets: 231 10*3/uL (ref 150–400)
RBC: 3.99 MIL/uL (ref 3.87–5.11)
RDW: 12.7 % (ref 11.5–15.5)
WBC: 7.2 10*3/uL (ref 4.0–10.5)
nRBC: 0 % (ref 0.0–0.2)

## 2023-03-18 LAB — BASIC METABOLIC PANEL
Anion gap: 6 (ref 5–15)
BUN: 25 mg/dL — ABNORMAL HIGH (ref 8–23)
CO2: 26 mmol/L (ref 22–32)
Calcium: 8.9 mg/dL (ref 8.9–10.3)
Chloride: 106 mmol/L (ref 98–111)
Creatinine, Ser: 0.78 mg/dL (ref 0.44–1.00)
GFR, Estimated: >60 mL/min (ref >60)
Glucose, Bld: 92 mg/dL (ref 70–99)
Potassium: 4.7 mmol/L (ref 3.5–5.1)
Sodium: 138 mmol/L (ref 135–145)

## 2023-03-18 LAB — HEMOGLOBIN A1C
Hgb A1c MFr Bld: 4.9 % (ref 4.8–5.6)
Mean Plasma Glucose: 93.93 mg/dL

## 2023-03-18 LAB — GLUCOSE, CAPILLARY: Glucose-Capillary: 106 mg/dL — ABNORMAL HIGH (ref 70–99)

## 2023-03-19 NOTE — Progress Notes (Signed)
 Anesthesia Chart Review   Case: [id number] Date/Time: 03/31/23 0845   Procedure: CYSTOSCOPY WITH BILATERAL STENT REMOVAL (Bilateral) - 30 MINUTE CASE   Anesthesia type: General   Pre-op diagnosis: BILATERAL RENAL STONES   Location: [facility] / [facility]   Surgeons: Crista Elliot, MD       DISCUSSION:77 y.o. never smoker with h/o HTN, iron deficiency anemia secondary to gastric bypass, MVP, mitral regurgitation, DM II, CKD, PVD, bilateral renal stones scheduled for above procedure 03/31/2023 with Dr. Modena Slater.   Pt has had multiple surgeries with GI following gastric bypass, receiving TPN per previous notes.   Spinal cord stimulator in place, advised to bring remote.   Per cardio notes 03/12/22 pt continues with chest pain and takes prn propranolol with improvement.  Per notes she has had numerous stress test over they years, all normal.  Echo 04/09/2022 with Normal LV EF of 60-65%, normal diastolic function, trivial MR, no changes from previous echo in 2016. EKG at PAT visit unchanged, no acute findings.   VS: BP 128/73   Pulse 93   Temp 36.9 C (Oral)   Resp 16   Ht 5\' 5"  (1.651 m)   Wt 47.6 kg   SpO2 98%   BMI 17.47 kg/m   PROVIDERS: Lewis Moccasin, MD is PCP   Cardiologist - Kristeen Miss, MD  LABS: Labs reviewed: Acceptable for surgery. (all labs ordered are listed, but only abnormal results are displayed)  Labs Reviewed  BASIC METABOLIC PANEL - Abnormal; Notable for the following components:      Result Value   BUN 25 (*)    All other components within normal limits  GLUCOSE, CAPILLARY - Abnormal; Notable for the following components:   Glucose-Capillary 106 (*)    All other components within normal limits  HEMOGLOBIN A1C  CBC     IMAGES:   EKG:   CV: Echo 04/09/2022  1. Left ventricular ejection fraction, by estimation, is 60 to 65%. The  left ventricle has normal function. The left ventricle has no regional  wall motion abnormalities. GLS  -23.5%   2. Right ventricular systolic function is normal. The right ventricular  size is normal.   3. The mitral valve is normal in structure. Trivial mitral valve  regurgitation. No evidence of mitral stenosis.   4. The aortic valve is normal in structure. Aortic valve regurgitation is  not visualized. No aortic stenosis is present.   5. The inferior vena cava is normal in size with greater than 50%  respiratory variability, suggesting right atrial pressure of 3 mmHg.   Myocardial Perfusion 07/20/2020 There was no ST segment deviation noted during stress. The left ventricular ejection fraction is normal (55-65%). Nuclear stress EF: 65%. The study is normal. This is a low risk study. Past Medical History:  Diagnosis Date   Adjustment disorder with depressed mood 03/18/2007   Allergy    Anal fissure 10/08/2016   Angina    takes Propanolol prn and it stops her chest   Anorexia nervosa 11/11/2019   Barrett esophagus    not consistently present   Cataract    removed  but had retinal detachment - repeat cataract right eye   Cervical facet joint syndrome 10/08/2016   Chronic constipation 10/04/2010   Chronic interstitial cystitis 10/16/2011   Chronic kidney disease    kidney stones and UTI   Chronic maxillary sinusitis 09/15/2013   Chronic narcotic dependence 10/03/2018   Complication of anesthesia  either  BP or pulse dropped with last surgery   Degeneration of cervical intervertebral disc 03/18/2007   Degeneration of lumbar or lumbosacral intervertebral disc 03/18/2007   Diaphragmatic hernia 03/18/2007   Dysrhythmia    brought on by stress   Epigastric pain 11/11/2019   Esophageal dysphagia 10/03/2018   External hemorrhoids    Fibromyalgia    neuropathy knees ankles and toes, bladder   Generalized anxiety disorder 03/18/2007   GERD (gastroesophageal reflux disease) 03/18/2007   HA (headache)-chronic/tension type 07/28/2012   Heart murmur    Hiatal hernia    History of  anemia    History of gastric bypass 02/11/2014   1999 at [facility]   History of hyperparathyroidism 01/22/2016   History of kidney stones    Hypertension    No longer on meds   Hypoglycemia 11/08/2016   IBS (irritable bowel syndrome)    Internal hemorrhoids    Kidney stones    multiple per patient   LLQ abdominal pain 11/11/2019   Macular degeneration    Malnutrition 11/11/2019   Mechanical breakdown implanted electronic neurostimulator spinal cord 11/16/2018   Mitral regurgitation 12/11/2015   MVP (mitral valve prolapse) 12/17/2017   Myalgia 03/18/2007   Nephrolithiasis 07/28/2012   lithotrip 2013-Eskridge,MD   Neuropathic pain 01/23/2018   Orthostatic hypotension 11/20/2016   Osteoarthritis    all over   Osteopetrosis    Osteoporosis 07/28/2012   Peripheral vascular disease    superficial phlebitis in left calf   Personal history of colonic polyps 07/22/2013   07/2013 - 3 small adenomas - repeat colonoscopy 2018   PTSD (post-traumatic stress disorder)    Reactive hypoglycemia    Rosacea 11/02/2017   Small intestinal bacterial overgrowth 08/05/2017   + lactulose breath test 06/2017 Xifaxan Rx   Type II diabetes mellitus 11/18/2016   Pt states now diagnosed with reactive hypoglycemia   Urinary tract infection     Past Surgical History:  Procedure Laterality Date   ABDOMINAL HYSTERECTOMY     APPENDECTOMY     CARDIAC CATHETERIZATION  03/22/1991   EF 61%   CARDIOVASCULAR STRESS TEST  10/03/2006   CERVICAL SPINE SURGERY  02/14/2022   BLOCK   CHOLECYSTECTOMY     COLONOSCOPY  06/23/2008   normal   DILATION AND CURETTAGE OF UTERUS     x2   ESOPHAGUS SURGERY     EXPLORATORY LAPAROTOMY  1993   EYE SURGERY     GASTRIC BYPASS  1999   GASTRIC RESTRICTION SURGERY  1991   LASIK Bilateral    Right Arm Surgery     Right Knee Arthroscopy     Rt wrist fx  2009   SPINAL CORD STIMULATOR IMPLANT     spinal surgery battery implant     stomach stappeling  1991   STONE  EXTRACTION WITH BASKET  2012   TONSILLECTOMY     TUBAL LIGATION     UPPER GASTROINTESTINAL ENDOSCOPY  06/23/2008   w/Dil, Barrett's esophagus   US ECHOCARDIOGRAPHY  01/21/2007   EF 55-60%   US ECHOCARDIOGRAPHY  08/30/2003   EF 55-60%    MEDICATIONS:  aspirin-acetaminophen-caffeine (EXCEDRIN MIGRAINE) 250-250-65 MG tablet   Baclofen 5 MG TABS   Biotin 1000 MCG CHEW   celecoxib (CELEBREX) 200 MG capsule   COLLAGEN PO   Continuous Blood Gluc Receiver (DEXCOM G7 RECEIVER) DEVI   cycloSPORINE (RESTASIS) 0.05 % ophthalmic emulsion   diclofenac Sodium (VOLTAREN) 1 % GEL   dicyclomine (BENTYL) 10 MG capsule  Docusate Sodium (DSS) 100 MG CAPS   DULoxetine (CYMBALTA) 30 MG capsule   famotidine (PEPCID) 20 MG tablet   fentaNYL (DURAGESIC) 25 MCG/HR   fentaNYL (DURAGESIC) 25 MCG/HR   Galcanezumab-gnlm (EMGALITY) 120 MG/ML SOAJ   glucagon 1 MG injection   HYDROcodone-acetaminophen (NORCO) 10-325 MG tablet   HYDROcodone-acetaminophen (NORCO) 10-325 MG tablet   HYDROcodone-acetaminophen (NORCO) 10-325 MG tablet   hydrocortisone cream 1 %   hypromellose (GENTEAL) 0.3 % GEL ophthalmic ointment   latanoprost (XALATAN) 0.005 % ophthalmic solution   lidocaine (LIDODERM) 5 %   magnesium hydroxide (MILK OF MAGNESIA) 400 MG/5ML suspension   metroNIDAZOLE (METROGEL) 1 % gel   mirabegron ER (MYRBETRIQ) 25 MG TB24 tablet   naloxegol oxalate (MOVANTIK) 25 MG TABS tablet   ondansetron (ZOFRAN) 8 MG tablet   OVER THE COUNTER MEDICATION   phenazopyridine (PYRIDIUM) 95 MG tablet   pregabalin (LYRICA) 50 MG capsule   Probiotic Product (PROBIOTIC-10 PO)   rizatriptan (MAXALT-MLT) 10 MG disintegrating tablet   solifenacin (VESICARE) 5 MG tablet   sucralfate (CARAFATE) 1 GM/10ML suspension   No current facility-administered medications for this encounter.    Jodell Cipro Ward, PA-C [facility] [phone]

## 2023-03-21 ENCOUNTER — Other Ambulatory Visit (HOSPITAL_COMMUNITY)

## 2023-03-22 NOTE — Anesthesia Preprocedure Evaluation (Signed)
 Anesthesia Evaluation  Patient identified by MRN, date of birth, ID band Patient awake    Reviewed: Allergy & Precautions, NPO status , Patient's Chart, lab work & pertinent test results  Airway Mallampati: II  TM Distance: >3 FB Neck ROM: Full    Dental no notable dental hx. (+) Teeth Intact, Dental Advisory Given   Pulmonary neg pulmonary ROS   Pulmonary exam normal breath sounds clear to auscultation       Cardiovascular hypertension, + angina  + Peripheral Vascular Disease  Normal cardiovascular exam Rhythm:Regular Rate:Normal  Echo 04/09/2022  1. Left ventricular ejection fraction, by estimation, is 60 to 65%. The  left ventricle has normal function. The left ventricle has no regional  wall motion abnormalities. GLS -23.5%   2. Right ventricular systolic function is normal. The right ventricular  size is normal.   3. The mitral valve is normal in structure. Trivial mitral valve  regurgitation. No evidence of mitral stenosis.   4. The aortic valve is normal in structure. Aortic valve regurgitation is  not visualized. No aortic stenosis is present.   5. The inferior vena cava is normal in size with greater than 50%  respiratory variability, suggesting right atrial pressure of 3 mmHg.    Myocardial Perfusion 07/20/2020  There was no ST segment deviation noted during stress.  The left ventricular ejection fraction is normal (55-65%).  Nuclear stress EF: 65%.  The study is normal.  This is a low risk study.    Neuro/Psych  Headaches PSYCHIATRIC DISORDERS Anxiety        GI/Hepatic hiatal hernia,GERD  ,,(+)     substance abuse (fentanyl patch 54mcg/hr, not currently wearing. Norco 10/325 1/2 pill q3hrs)    Endo/Other  diabetes (BG 100), Well Controlled    Renal/GU Renal InsufficiencyRenal disease  negative genitourinary   Musculoskeletal  (+) Arthritis ,  Fibromyalgia -, narcotic dependentS/p spinal cord  stimulator, currently off   Abdominal   Peds  Hematology negative hematology ROS (+)   Anesthesia Other Findings   Reproductive/Obstetrics                             Anesthesia Physical Anesthesia Plan  ASA: 3  Anesthesia Plan: General   Post-op Pain Management: Dilaudid IV   Induction: Intravenous  PONV Risk Score and Plan: 3 and Ondansetron, Dexamethasone and Treatment may vary due to age or medical condition  Airway Management Planned: LMA  Additional Equipment:   Intra-op Plan:   Post-operative Plan: Extubation in OR  Informed Consent: I have reviewed the patients History and Physical, chart, labs and discussed the procedure including the risks, benefits and alternatives for the proposed anesthesia with the patient or authorized representative who has indicated his/her understanding and acceptance.     Dental advisory given  Plan Discussed with: CRNA  Anesthesia Plan Comments:        Anesthesia Quick Evaluation

## 2023-03-24 ENCOUNTER — Ambulatory Visit (HOSPITAL_COMMUNITY): Payer: Self-pay | Admitting: Anesthesiology

## 2023-03-24 ENCOUNTER — Ambulatory Visit (HOSPITAL_COMMUNITY)
Admission: RE | Admit: 2023-03-24 | Discharge: 2023-03-24 | Disposition: A | Discharge status: Home / Self Care | Payer: Medicare Other | Source: Ambulatory Visit | Attending: Urology | Admitting: Urology

## 2023-03-24 ENCOUNTER — Ambulatory Visit (HOSPITAL_COMMUNITY)

## 2023-03-24 ENCOUNTER — Ambulatory Visit (HOSPITAL_BASED_OUTPATIENT_CLINIC_OR_DEPARTMENT_OTHER): Payer: Self-pay | Admitting: Anesthesiology

## 2023-03-24 ENCOUNTER — Encounter (HOSPITAL_COMMUNITY)
Admission: RE | Disposition: A | Discharge status: Home / Self Care | Payer: Self-pay | Source: Ambulatory Visit | Attending: Urology

## 2023-03-24 ENCOUNTER — Other Ambulatory Visit: Payer: Self-pay

## 2023-03-24 ENCOUNTER — Encounter (HOSPITAL_COMMUNITY): Payer: Self-pay | Admitting: Urology

## 2023-03-24 DIAGNOSIS — R310 Gross hematuria: Secondary | ICD-10-CM | POA: Insufficient documentation

## 2023-03-24 DIAGNOSIS — E109 Type 1 diabetes mellitus without complications: Secondary | ICD-10-CM

## 2023-03-24 DIAGNOSIS — Z79891 Long term (current) use of opiate analgesic: Secondary | ICD-10-CM | POA: Diagnosis not present

## 2023-03-24 DIAGNOSIS — E1122 Type 2 diabetes mellitus with diabetic chronic kidney disease: Secondary | ICD-10-CM | POA: Diagnosis not present

## 2023-03-24 DIAGNOSIS — M797 Fibromyalgia: Secondary | ICD-10-CM | POA: Insufficient documentation

## 2023-03-24 DIAGNOSIS — I209 Angina pectoris, unspecified: Secondary | ICD-10-CM | POA: Insufficient documentation

## 2023-03-24 DIAGNOSIS — N189 Chronic kidney disease, unspecified: Secondary | ICD-10-CM

## 2023-03-24 DIAGNOSIS — N200 Calculus of kidney: Secondary | ICD-10-CM | POA: Diagnosis not present

## 2023-03-24 DIAGNOSIS — I10 Essential (primary) hypertension: Secondary | ICD-10-CM | POA: Insufficient documentation

## 2023-03-24 DIAGNOSIS — E1151 Type 2 diabetes mellitus with diabetic peripheral angiopathy without gangrene: Secondary | ICD-10-CM | POA: Insufficient documentation

## 2023-03-24 DIAGNOSIS — K219 Gastro-esophageal reflux disease without esophagitis: Secondary | ICD-10-CM | POA: Insufficient documentation

## 2023-03-24 DIAGNOSIS — I129 Hypertensive chronic kidney disease with stage 1 through stage 4 chronic kidney disease, or unspecified chronic kidney disease: Secondary | ICD-10-CM

## 2023-03-24 HISTORY — PX: CYSTOSCOPY/URETEROSCOPY/HOLMIUM LASER/STENT PLACEMENT: SHX6546

## 2023-03-24 LAB — GLUCOSE, CAPILLARY
Glucose-Capillary: 81 mg/dL (ref 70–99)
Glucose-Capillary: 78 mg/dL (ref 70–99)

## 2023-03-24 SURGERY — CYSTOSCOPY/URETEROSCOPY/HOLMIUM LASER/STENT PLACEMENT
Anesthesia: General | Laterality: Bilateral

## 2023-03-24 MED ORDER — FENTANYL CITRATE PF 50 MCG/ML IJ SOSY: 25.0000 ug | PREFILLED_SYRINGE | INTRAMUSCULAR | Status: DC | PRN

## 2023-03-24 MED ORDER — IOHEXOL 300 MG/ML  SOLN
INTRAMUSCULAR | Status: DC | PRN
  Administered 2023-03-24: 15 mL

## 2023-03-24 MED ORDER — DEXAMETHASONE SODIUM PHOSPHATE 10 MG/ML IJ SOLN
INTRAMUSCULAR | Status: DC | PRN
Start: 2023-03-24 — End: 2023-03-24
  Administered 2023-03-24: 8 mg via INTRAVENOUS

## 2023-03-24 MED ORDER — LIDOCAINE HCL (PF) 2 % IJ SOLN
INTRAMUSCULAR | Status: DC | PRN
  Administered 2023-03-24: 50 mg via INTRADERMAL

## 2023-03-24 MED ORDER — MIDAZOLAM HCL 2 MG/2ML IJ SOLN
INTRAMUSCULAR | Status: DC | PRN
Start: 2023-03-24 — End: 2023-03-24
  Administered 2023-03-24: 2 mg via INTRAVENOUS

## 2023-03-24 MED ORDER — FENTANYL CITRATE (PF) 100 MCG/2ML IJ SOLN
INTRAMUSCULAR | Status: AC
  Filled 2023-03-24: qty 2

## 2023-03-24 MED ORDER — PROPOFOL 10 MG/ML IV BOLUS
INTRAVENOUS | Status: DC | PRN
  Administered 2023-03-24: 100 mg via INTRAVENOUS

## 2023-03-24 MED ORDER — EPHEDRINE 5 MG/ML INJ
INTRAVENOUS | Status: AC
  Filled 2023-03-24: qty 5

## 2023-03-24 MED ORDER — ACETAMINOPHEN 500 MG PO TABS
1000.0000 mg | ORAL_TABLET | Freq: Once | ORAL | Status: AC
  Administered 2023-03-24: 1000 mg via ORAL
  Filled 2023-03-24: qty 2

## 2023-03-24 MED ORDER — HYDROMORPHONE HCL 1 MG/ML IJ SOLN
INTRAMUSCULAR | Status: AC
  Filled 2023-03-24: qty 1

## 2023-03-24 MED ORDER — ONDANSETRON HCL 4 MG/2ML IJ SOLN
INTRAMUSCULAR | Status: DC | PRN
  Administered 2023-03-24: 4 mg via INTRAVENOUS

## 2023-03-24 MED ORDER — PHENYLEPHRINE 80 MCG/ML (10ML) SYRINGE FOR IV PUSH (FOR BLOOD PRESSURE SUPPORT)
PREFILLED_SYRINGE | INTRAVENOUS | Status: DC | PRN
  Administered 2023-03-24 (×2): 80 ug via INTRAVENOUS

## 2023-03-24 MED ORDER — SODIUM CHLORIDE 0.9 % IR SOLN
Status: DC | PRN
  Administered 2023-03-24: 3000 mL via INTRAVESICAL

## 2023-03-24 MED ORDER — HYDROMORPHONE HCL 1 MG/ML IJ SOLN
0.2500 mg | INTRAMUSCULAR | Status: DC | PRN
  Administered 2023-03-24 (×4): 0.5 mg via INTRAVENOUS

## 2023-03-24 MED ORDER — FENTANYL CITRATE (PF) 100 MCG/2ML IJ SOLN
INTRAMUSCULAR | Status: DC | PRN
  Administered 2023-03-24: 50 ug via INTRAVENOUS

## 2023-03-24 MED ORDER — AMISULPRIDE (ANTIEMETIC) 5 MG/2ML IV SOLN: 10.0000 mg | Freq: Once | INTRAVENOUS | Status: DC | PRN

## 2023-03-24 MED ORDER — LACTATED RINGERS IV SOLN: INTRAVENOUS | Status: DC

## 2023-03-24 MED ORDER — CHLORHEXIDINE GLUCONATE 0.12 % MT SOLN
15.0000 mL | Freq: Once | OROMUCOSAL | Status: AC
  Administered 2023-03-24: 15 mL via OROMUCOSAL

## 2023-03-24 MED ORDER — SODIUM CHLORIDE 0.9 % IV SOLN
2.0000 g | INTRAVENOUS | Status: AC
  Administered 2023-03-24: 2 g via INTRAVENOUS
  Filled 2023-03-24: qty 20

## 2023-03-24 MED ORDER — MIDAZOLAM HCL 2 MG/2ML IJ SOLN
INTRAMUSCULAR | Status: AC
  Filled 2023-03-24: qty 2

## 2023-03-24 MED ORDER — FENTANYL CITRATE PF 50 MCG/ML IJ SOSY
PREFILLED_SYRINGE | INTRAMUSCULAR | Status: DC
Start: 2023-03-24 — End: 2023-03-24
  Filled 2023-03-24: qty 1

## 2023-03-24 MED ORDER — HYDROMORPHONE HCL 1 MG/ML IJ SOLN
INTRAMUSCULAR | Status: DC
Start: 2023-03-24 — End: 2023-03-24
  Filled 2023-03-24: qty 1

## 2023-03-24 MED ORDER — PHENYLEPHRINE 80 MCG/ML (10ML) SYRINGE FOR IV PUSH (FOR BLOOD PRESSURE SUPPORT)
PREFILLED_SYRINGE | INTRAVENOUS | Status: AC
  Filled 2023-03-24: qty 10

## 2023-03-24 MED ORDER — EPHEDRINE SULFATE-NACL 50-0.9 MG/10ML-% IV SOSY
PREFILLED_SYRINGE | INTRAVENOUS | Status: DC | PRN
  Administered 2023-03-24: 10 mg via INTRAVENOUS

## 2023-03-24 MED ORDER — ORAL CARE MOUTH RINSE: 15.0000 mL | Freq: Once | OROMUCOSAL | Status: AC

## 2023-03-24 MED ORDER — FENTANYL CITRATE PF 50 MCG/ML IJ SOSY
50.0000 ug | PREFILLED_SYRINGE | Freq: Once | INTRAMUSCULAR | Status: AC
  Administered 2023-03-24: 50 ug via INTRAVENOUS

## 2023-03-24 SURGICAL SUPPLY — 22 items
BAG URO CATCHER STRL LF (MISCELLANEOUS) ×1 IMPLANT
BASKET LASER NITINOL 1.9FR (BASKET) IMPLANT
BASKET ZERO TIP NITINOL 2.4FR (BASKET) IMPLANT
CATH URETERAL DUAL LUMEN 10F (MISCELLANEOUS) IMPLANT
CATH URETL OPEN END 6FR 70 (CATHETERS) ×1 IMPLANT
CLOTH BEACON ORANGE TIMEOUT ST (SAFETY) ×1 IMPLANT
EXTRACTOR STONE 1.7FRX115CM (UROLOGICAL SUPPLIES) IMPLANT
FIBER LASER MOSES 200 DFL (Laser) IMPLANT
GLOVE BIO SURGEON STRL SZ7.5 (GLOVE) ×1 IMPLANT
GOWN STRL REUS W/ TWL XL LVL3 (GOWN DISPOSABLE) ×1 IMPLANT
GUIDEWIRE ANG ZIPWIRE 038X150 (WIRE) IMPLANT
GUIDEWIRE STR DUAL SENSOR (WIRE) ×1 IMPLANT
KIT TURNOVER KIT A (KITS) IMPLANT
LASER FIB FLEXIVA PULSE ID 365 (Laser) IMPLANT
MANIFOLD NEPTUNE II (INSTRUMENTS) ×1 IMPLANT
PACK CYSTO (CUSTOM PROCEDURE TRAY) ×1 IMPLANT
SHEATH NAVIGATOR HD 11/13X28 (SHEATH) IMPLANT
SHEATH NAVIGATOR HD 11/13X36 (SHEATH) IMPLANT
STENT URET 6FRX24 CONTOUR (STENTS) IMPLANT
TRACTIP FLEXIVA PULS ID 200XHI (Laser) IMPLANT
TUBING CONNECTING 10 (TUBING) ×1 IMPLANT
TUBING UROLOGY SET (TUBING) ×1 IMPLANT

## 2023-03-24 NOTE — H&P (View-Only) (Signed)
 CC/HPI: CC: Bilateral renal calculi  HPI:  02/28/2023  78 year old female underwent CT scan of the abdomen and pelvis with contrast on 02/11/2023 at [facility] for flank pain. She was found to have bilateral nonobstructing renal calculi measuring up to 9 mm. She does have some intermittent gross hematuria. She is also a patient of Dr. Sherron Monday for chronic urinary incontinence. Planning for likely Botox in the future for that. She has had a cystoscopy that was negative for urothelial lesion in the bladder. Patient has chronic flank pain. No hematuria today. Denies dysuria.     ALLERGIES: Ambien TABS Amoxicillin Cipro TABS Codeine Derivatives Darvocet Darvon Lorcet Plus TABS Opium TINC Penicillins Percocet TABS Tylenol TABS Tylox CAPS VESIcare TABS Vicodin Wheat Bran    MEDICATIONS: Detrol La 4 mg capsule, ext release 24 hr 1 capsule PO Daily  Myrbetriq 25 mg tablet, extended release 24 hr  Trimethoprim 100 mg tablet 1 tablet PO Daily  Vesicare 5 mg tablet 1 tablet PO Daily  Aimovig Autoinjector 140 mg/ml auto-injector  Cyclobenzaprine Hcl 10 mg tablet  Cycloserine  Diclofenac Sodium 1 % gel  Diltiazem 2% Gel  Duloxetine Hcl 30 mg capsule,delayed release  Esomeprazole Magnesium 40 mg capsule,delayed release  Excedrin Migraine  Fentanyl 25 mcg/hour patch, transdermal 72 hours  Flurbiprofen 100 mg tablet  Fosamax 70 MG Oral Tablet Oral  Hydrocodone-Acetaminophen 10 mg-325 mg tablet  Lasix  Lidoderm 5 % adhesive patch, medicated External  Metronidazole 1 % gel  Movantik 25 mg tablet  Mupirocin 2 % ointment  Naloxone Hcl 0.4 mg/ml cartridge  Ondansetron Hcl 8 mg tablet  Pregabalin 50 mg capsule  Premarin 0.625 mg/gram cream with applicator 1 gram Per Vagina 3 times a week for 3 weeks and then once weekly  Probiotic-10  Sucralfate 1 gram/10 ml suspension, oral  Tizanidine Hcl 2 mg tablet  Valacyclovir 1,000 mg tablet  Vitamin D2  Xiidra 5 % dropperette, single-use  drop dispenser     GU PSH: Complex cystometrogram, w/ void pressure and urethral pressure profile studies, any technique - 2021 Complex Uroflow - 2021 Cysto Bladder Ureth Biopsy - 2010 Cystoscopy - 02/25/2023 Cystoscopy Hydrodistention - 2010 Cystoscopy Insert Stent - 2011 Emg surf Electrd - 2021 ESWL - 2013 Hysterectomy Unilat SO - 2008 Inject For cystogram - 2021 Intrabd voidng Press - 2021 Ureteroscopic stone removal - 2011       PSH Notes: Lithotripsy, Cystoscopy With Ureteroscopy With Removal Of Calculus, Cystoscopy With Insertion Of Ureteral Stent Left, Cystoscopy With Biopsy, Cystoscopy With Dilation Of Bladder, Gallbladder Surgery, Appendectomy, Tubal Ligation, Tonsillectomy, Hysterectomy   NON-GU PSH: Appendectomy - 2008 Neuroeltrd Stim Post Tibial - 06/21/2021, 06/05/2021, 05/29/2021, 10/24/2020, 09/28/2020, 08/31/2020, 2022, 2022, 2022, 2022, 2022, 2022, 2022, 2022, 2022, 2022, 2022, 2022, 2022, 2022 Remove Tonsils - 2008 Tubal Ligation - 2008 Visit Complexity (formerly GPC1X) - 02/25/2023, 02/20/2023, 10/02/2022, 08/21/2022, 07/25/2022     GU PMH: Chronic cystitis (w/o hematuria) - 02/25/2023, - 02/20/2023, - 10/02/2022, - 08/21/2022, - 2021, Chronic cystitis, - 2014 Gross hematuria - 02/25/2023, Gross hematuria, - 2014 Renal calculus (Stable) - 02/20/2023, Nephrolithiasis, - 2014 Urge incontinence - 02/20/2023, - 10/02/2022 (Stable), - 08/21/2022 (Stable), - 07/25/2022, - 06/21/2021, - 06/05/2021, - 05/29/2021, - 10/24/2020, - 09/28/2020, - 08/31/2020, - 2022, - 2022, - 2022, - 2022, - 2022, - 2022, - 2022, - 2022, - 2022, - 2022, - 2022, - 2022, - 2022, - 2021 Urinary Frequency - 02/20/2023, (Stable), - 10/02/2022 (Stable), - 08/21/2022 (Stable), - 07/25/2022, - 06/21/2021, -  06/05/2021, - 05/29/2021, - 10/24/2020, - 09/28/2020, - 08/31/2020, - 2022, - 2022, - 2022, - 2022, - 2022, - 2022, - 2022, - 2022, - 2022, - 2022, - 2022, - 2022, - 2022, - 2022 (Stable), - 2021, Increased urinary frequency, - 2014 Nocturia  (Stable) - 08/21/2022, (Stable), - 07/25/2022, - 2021, - 2021 Mixed incontinence - 2022, - 2022, - 2021, Urge and stress incontinence, - 2014 Flank Pain - 2022 History of urolithiasis - 2022 Ureteral calculus (Stable) - 2021, Calculus of ureter, - 2014 Hematuria, Unspec, Hematuria - 2014, Blood in urine, - 2014 Hydronephrosis Unspec, Hydronephrosis, right - 2014 Interstitial Cystitis (w/o hematuria), Chronic interstitial cystitis without hematuria - 2014 Other microscopic hematuria, Microscopic hematuria - 2014 Other urethritis, Urethritis - 2014 Urinary Tract Inf, Unspec site, Pyuria - 2014 Urinary Urgency, Urinary urgency - 2014      PMH Notes:  2009-07-05 11:58:23 - Note: Mitral Valve Disorder  2010-10-30 15:03:43 - Note: Bacterial Vaginosis      NON-GU PMH: Age-related osteoporosis without current pathological fracture, Osteoporosis - 2014 Anxiety, Anxiety - 2014 Arrhythmia, Rhythm Disorder - 2014 Barrett's esophagus without dysplasia, Barrett's Esophagus - 2014 Fibromyalgia, Fibromyalgia - 2014 Hyperparathyroidism, Hyperparathyroidism - 2014 Personal history of other diseases of the nervous system and sense organs, History of migraine headaches - 2014 Personal history of other specified conditions, History of heartburn - 2014 Post-traumatic stress disorder, unspecified, Post-traumatic Stress Disorder - 2014 Encounter for general adult medical examination without abnormal findings, Encounter for preventive health examination    FAMILY HISTORY: Death In The Family Father - Father Death In The Family Mother - Mother Family Health Status Number - Runs In Family   SOCIAL HISTORY: No Social History     Notes: Never A Smoker, Caffeine Use, Marital History - Currently Married, Tobacco Use, Alcohol Use, Occupation:   REVIEW OF SYSTEMS:    GU Review Female:   Patient denies frequent urination, hard to postpone urination, burning /pain with urination, get up at night to urinate,  leakage of urine, stream starts and stops, trouble starting your stream, have to strain to urinate, and being pregnant.  Gastrointestinal (Upper):   Patient denies nausea, vomiting, and indigestion/ heartburn.  Gastrointestinal (Lower):   Patient denies diarrhea and constipation.  Constitutional:   Patient denies fever, night sweats, weight loss, and fatigue.  Skin:   Patient denies skin rash/ lesion and itching.  Eyes:   Patient denies blurred vision and double vision.  Ears/ Nose/ Throat:   Patient denies sore throat and sinus problems.  Hematologic/Lymphatic:   Patient denies swollen glands and easy bruising.  Cardiovascular:   Patient denies leg swelling and chest pains.  Respiratory:   Patient denies cough and shortness of breath.  Endocrine:   Patient denies excessive thirst.  Musculoskeletal:   Patient denies back pain and joint pain.  Neurological:   Patient denies headaches and dizziness.  Psychologic:   Patient denies depression and anxiety.   VITAL SIGNS: None   MULTI-SYSTEM PHYSICAL EXAMINATION:    Constitutional: Well-nourished. No physical deformities. Normally developed. Good grooming.  Gastrointestinal: No mass, no tenderness, no rigidity, non obese abdomen.  Eyes: Normal conjunctivae. Normal eyelids.  Musculoskeletal: Normal gait and station of head and neck.     Complexity of Data:  Source Of History:  Patient  Records Review:   Previous Doctor Records, Previous Patient Records  Urine Test Review:   Urinalysis   PROCEDURES:          Visit Complexity - [phone]  Urinalysis Dipstick Dipstick Cont'd  Color: Amber Bilirubin: Neg mg/dL  Appearance: Clear Ketones: Neg mg/dL  Specific Gravity: 6.962 Blood: Neg ery/uL  pH: <=5.0 Protein: Neg mg/dL  Glucose: Neg mg/dL Urobilinogen: 0.2 mg/dL    Nitrites: Neg    Leukocyte Esterase: Neg leu/uL    ASSESSMENT:      ICD-10 Details  1 GU:   Renal calculus - N20.0 Chronic, Stable  2   Gross hematuria - R31.0  Chronic, Stable   PLAN:           Orders Labs Urine Culture          Document Letter(s):  Created for Patient: Clinical Summary         Notes:   We discussed the management of urinary stones. These options include observation, ureteroscopy, and shockwave lithotripsy. We discussed which options are relevant to these particular stones. We discussed the natural history of stones as well as the complications of untreated stones and the impact on quality of life without treatment as well as with each of the above listed treatments. We also discussed the efficacy of each treatment in its ability to clear the stone burden. With any of these management options I discussed the signs and symptoms of infection and the need for emergent treatment should these be experienced. For each option we discussed the ability of each procedure to clear the patient of their stone burden.   For observation I described the risks which include but are not limited to silent renal damage, life-threatening infection, need for emergent surgery, failure to pass stone, and pain.   For ureteroscopy I described the risks which include heart attack, stroke, pulmonary embolus, death, bleeding, infection, damage to contiguous structures, positioning injury, ureteral stricture, ureteral avulsion, ureteral injury, need for ureteral stent, inability to perform ureteroscopy, need for an interval procedure, inability to clear stone burden, stent discomfort and pain.   For shockwave lithotripsy I described the risks which include arrhythmia, kidney contusion, kidney hemorrhage, need for transfusion, pain, inability to break up stone, inability to pass stone fragments, Steinstrasse, infection associated with obstructing stones, need for different surgical procedure, need for repeat shockwave lithotripsy.   She would like to proceed with ureteroscopy.   CC: Dr. Duanne Guess    Signed by Modena Slater, III, M.D. on 02/28/23 at 9:03 AM (EST

## 2023-03-24 NOTE — Interval H&P Note (Signed)
 History and Physical Interval Note:  03/24/2023 1:51 PM  KASH MOTHERSHEAD  has presented today for surgery, with the diagnosis of BILATERAL RENAL STONES.  The various methods of treatment have been discussed with the patient and family. After consideration of risks, benefits and other options for treatment, the patient has consented to  Procedure(s) with comments: CYSTOSCOPY/BILATERAL URETEROSCOPY/HOLMIUM LASER/STENT PLACEMENT (Bilateral) - 90 MINUTE CASE as a surgical intervention.  The patient's history has been reviewed, patient examined, no change in status, stable for surgery.  I have reviewed the patient's chart and labs.  Questions were answered to the patient's satisfaction.     Ray Church, III

## 2023-03-24 NOTE — Op Note (Signed)
 Operative Note  Preoperative diagnosis:  1.  Bilateral renal calculi  Postoperative diagnosis: 1.  Bilateral renal calculi  Procedure(s): 1.  Cystoscopy with bilateral retrograde pyelogram, bilateral ureteroscopy with laser lithotripsy and ureteral stent placements  Surgeon: Modena Slater, MD  Assistants: None  Anesthesia: General  Complications: None immediate  EBL: Minimal  Specimens: 1.  None  Drains/Catheters: 1.  Bilateral 6 x 24 double-J ureteral stents  Intraoperative findings: 1.  Normal urethra and bladder  2.  Right retrograde pyelogram revealed no evidence of filling defect or hydronephrosis   3.  Right ureteroscopy confirmed multiple stones.  These were all laser fragmented all dust settings to tiny fragments.  All stone treated in the right kidney.  4.  Left retrograde pyelogram revealed mild hydronephrosis.  There were no filling defects after treatment of the stone.  5.  Left ureteroscopy confirmed multiple small stones within the left kidney which were laser fragmented all dust settings to tiny fragments.  Indication: 78 year old female with bilateral renal calculi and flank pain presents for the previously mentioned operation.  Description of procedure:  The patient was identified and consent was obtained.  The patient was taken to the operating room and placed in the supine position.  The patient was placed under general anesthesia.  Perioperative antibiotics were administered.  The patient was placed in dorsal lithotomy.  Patient was prepped and draped in a standard sterile fashion and a timeout was performed.  A 21 French rigid cystoscope was advanced into the urethra and into the bladder.  Complete cystoscopy was performed with no abnormal findings.  The right ureter was cannulated with a sensor wire which was advanced up to the kidney under fluoroscopic guidance.  A second sensor wire was advanced alongside this.  Semirigid ureteroscopy was performed up  to the renal pelvis.  Retrograde pyelogram was performed with findings noted above.  I withdrew the scope and advanced an 11 x 13 ureteral access sheath over one of the wires under continuous fluoroscopic guidance to the proximal ureter.  Inner sheath and wire were withdrawn.  Digital ureteroscopy was performed and multiple stones were laser fragmented on dust settings to tiny fragments.  All clinically significant stone fragments were treated.  I withdrew the scope along with the access sheath visualizing the ureter upon removal.  There were no ureteral calculi and no ureteral injury was seen.  I backloaded the wire onto rigid cystoscope and advanced that into the bladder followed by routine placement of a 6 x 24 double-J ureteral stent.  Fluoroscopy confirmed proximal placement and direct visualization confirmed a good coil within the bladder.  I drained the bladder.  I passed a wire up the left ureter and into the kidney under fluoroscopy.  Second wire was advanced alongside this up into the kidney under fluoroscopic guidance.  An 11 x 13 ureteral access sheath was advanced over one of the wires under continuous fluoroscopic guidance up to the proximal ureter.  Inner sheath and wire were withdrawn.  Digital ureteroscopy was performed and multiple stones were laser fragmented on dust settings to tiny fragments.  All clinically significant stone fragments treated.  She had a number of Randall's plaques.  These were treated.  I shot a retrograde pyelogram through the scope with the findings noted above.  I then withdrew the scope along with the access sheath visualizing the ureter upon removal.  There were no ureteral calculi and no ureteral injury was seen.  I backloaded the wire onto rigid cystoscope and  advanced that into the bladder followed by routine placement of a 6 x 24 double-J ureteral stent.  Fluoroscopy confirmed proximal placement and direct visualization confirmed a good coil within the bladder.  I  drained the bladder withdrew the scope.  Patient tolerated procedure well was stable postoperatively.  Plan: Return in 1 week for stent removal

## 2023-03-24 NOTE — Discharge Instructions (Addendum)

## 2023-03-24 NOTE — Anesthesia Procedure Notes (Signed)
 Procedure Name: LMA Insertion Date/Time: 03/24/2023 2:07 PM  Performed by: Micki Riley, CRNAPre-anesthesia Checklist: Patient identified, Emergency Drugs available, Suction available and Patient being monitored Patient Re-evaluated:Patient Re-evaluated prior to induction Oxygen Delivery Method: Circle System Utilized Preoxygenation: Pre-oxygenation with 100% oxygen Induction Type: IV induction Ventilation: Mask ventilation without difficulty LMA: LMA inserted LMA Size: 3.0 Number of attempts: 1 Airway Equipment and Method: Bite block Placement Confirmation: positive ETCO2 Tube secured with: Tape Dental Injury: Teeth and Oropharynx as per pre-operative assessment

## 2023-03-24 NOTE — Anesthesia Postprocedure Evaluation (Signed)
 Anesthesia Post Note  Patient: Kayla [name]  Procedure(s) Performed: CYSTOSCOPY/BILATERAL URETEROSCOPY/HOLMIUM LASER/STENT PLACEMENT (Bilateral)     Patient location during evaluation: PACU Anesthesia Type: General Level of consciousness: awake and alert Pain management: pain level controlled Vital Signs Assessment: post-procedure vital signs reviewed and stable Respiratory status: spontaneous breathing, nonlabored ventilation and respiratory function stable Cardiovascular status: blood pressure returned to baseline and stable Postop Assessment: no apparent nausea or vomiting Anesthetic complications: no  No notable events documented.  Last Vitals:  Vitals:   03/24/23 1600 03/24/23 1615  BP: 130/68 128/63  Pulse: 99 98  Resp: 17 19  Temp:    SpO2: 96% 95%    Last Pain:  Vitals:   03/24/23 1613  TempSrc:   PainSc: 7                  Kasidi Shanker,W. EDMOND

## 2023-03-24 NOTE — H&P (Signed)
 CC/HPI: CC: Bilateral renal calculi  HPI:  02/28/2023  78 year old female underwent CT scan of the abdomen and pelvis with contrast on 02/11/2023 at [facility] for flank pain. She was found to have bilateral nonobstructing renal calculi measuring up to 9 mm. She does have some intermittent gross hematuria. She is also a patient of Dr. Sherron Monday for chronic urinary incontinence. Planning for likely Botox in the future for that. She has had a cystoscopy that was negative for urothelial lesion in the bladder. Patient has chronic flank pain. No hematuria today. Denies dysuria.     ALLERGIES: Ambien TABS Amoxicillin Cipro TABS Codeine Derivatives Darvocet Darvon Lorcet Plus TABS Opium TINC Penicillins Percocet TABS Tylenol TABS Tylox CAPS VESIcare TABS Vicodin Wheat Bran    MEDICATIONS: Detrol La 4 mg capsule, ext release 24 hr 1 capsule PO Daily  Myrbetriq 25 mg tablet, extended release 24 hr  Trimethoprim 100 mg tablet 1 tablet PO Daily  Vesicare 5 mg tablet 1 tablet PO Daily  Aimovig Autoinjector 140 mg/ml auto-injector  Cyclobenzaprine Hcl 10 mg tablet  Cycloserine  Diclofenac Sodium 1 % gel  Diltiazem 2% Gel  Duloxetine Hcl 30 mg capsule,delayed release  Esomeprazole Magnesium 40 mg capsule,delayed release  Excedrin Migraine  Fentanyl 25 mcg/hour patch, transdermal 72 hours  Flurbiprofen 100 mg tablet  Fosamax 70 MG Oral Tablet Oral  Hydrocodone-Acetaminophen 10 mg-325 mg tablet  Lasix  Lidoderm 5 % adhesive patch, medicated External  Metronidazole 1 % gel  Movantik 25 mg tablet  Mupirocin 2 % ointment  Naloxone Hcl 0.4 mg/ml cartridge  Ondansetron Hcl 8 mg tablet  Pregabalin 50 mg capsule  Premarin 0.625 mg/gram cream with applicator 1 gram Per Vagina 3 times a week for 3 weeks and then once weekly  Probiotic-10  Sucralfate 1 gram/10 ml suspension, oral  Tizanidine Hcl 2 mg tablet  Valacyclovir 1,000 mg tablet  Vitamin D2  Xiidra 5 % dropperette, single-use  drop dispenser     GU PSH: Complex cystometrogram, w/ void pressure and urethral pressure profile studies, any technique - 2021 Complex Uroflow - 2021 Cysto Bladder Ureth Biopsy - 2010 Cystoscopy - 02/25/2023 Cystoscopy Hydrodistention - 2010 Cystoscopy Insert Stent - 2011 Emg surf Electrd - 2021 ESWL - 2013 Hysterectomy Unilat SO - 2008 Inject For cystogram - 2021 Intrabd voidng Press - 2021 Ureteroscopic stone removal - 2011       PSH Notes: Lithotripsy, Cystoscopy With Ureteroscopy With Removal Of Calculus, Cystoscopy With Insertion Of Ureteral Stent Left, Cystoscopy With Biopsy, Cystoscopy With Dilation Of Bladder, Gallbladder Surgery, Appendectomy, Tubal Ligation, Tonsillectomy, Hysterectomy   NON-GU PSH: Appendectomy - 2008 Neuroeltrd Stim Post Tibial - 06/21/2021, 06/05/2021, 05/29/2021, 10/24/2020, 09/28/2020, 08/31/2020, 2022, 2022, 2022, 2022, 2022, 2022, 2022, 2022, 2022, 2022, 2022, 2022, 2022, 2022 Remove Tonsils - 2008 Tubal Ligation - 2008 Visit Complexity (formerly GPC1X) - 02/25/2023, 02/20/2023, 10/02/2022, 08/21/2022, 07/25/2022     GU PMH: Chronic cystitis (w/o hematuria) - 02/25/2023, - 02/20/2023, - 10/02/2022, - 08/21/2022, - 2021, Chronic cystitis, - 2014 Gross hematuria - 02/25/2023, Gross hematuria, - 2014 Renal calculus (Stable) - 02/20/2023, Nephrolithiasis, - 2014 Urge incontinence - 02/20/2023, - 10/02/2022 (Stable), - 08/21/2022 (Stable), - 07/25/2022, - 06/21/2021, - 06/05/2021, - 05/29/2021, - 10/24/2020, - 09/28/2020, - 08/31/2020, - 2022, - 2022, - 2022, - 2022, - 2022, - 2022, - 2022, - 2022, - 2022, - 2022, - 2022, - 2022, - 2022, - 2021 Urinary Frequency - 02/20/2023, (Stable), - 10/02/2022 (Stable), - 08/21/2022 (Stable), - 07/25/2022, - 06/21/2021, -  06/05/2021, - 05/29/2021, - 10/24/2020, - 09/28/2020, - 08/31/2020, - 2022, - 2022, - 2022, - 2022, - 2022, - 2022, - 2022, - 2022, - 2022, - 2022, - 2022, - 2022, - 2022, - 2022 (Stable), - 2021, Increased urinary frequency, - 2014 Nocturia  (Stable) - 08/21/2022, (Stable), - 07/25/2022, - 2021, - 2021 Mixed incontinence - 2022, - 2022, - 2021, Urge and stress incontinence, - 2014 Flank Pain - 2022 History of urolithiasis - 2022 Ureteral calculus (Stable) - 2021, Calculus of ureter, - 2014 Hematuria, Unspec, Hematuria - 2014, Blood in urine, - 2014 Hydronephrosis Unspec, Hydronephrosis, right - 2014 Interstitial Cystitis (w/o hematuria), Chronic interstitial cystitis without hematuria - 2014 Other microscopic hematuria, Microscopic hematuria - 2014 Other urethritis, Urethritis - 2014 Urinary Tract Inf, Unspec site, Pyuria - 2014 Urinary Urgency, Urinary urgency - 2014      PMH Notes:  2009-07-05 11:58:23 - Note: Mitral Valve Disorder  2010-10-30 15:03:43 - Note: Bacterial Vaginosis      NON-GU PMH: Age-related osteoporosis without current pathological fracture, Osteoporosis - 2014 Anxiety, Anxiety - 2014 Arrhythmia, Rhythm Disorder - 2014 Barrett's esophagus without dysplasia, Barrett's Esophagus - 2014 Fibromyalgia, Fibromyalgia - 2014 Hyperparathyroidism, Hyperparathyroidism - 2014 Personal history of other diseases of the nervous system and sense organs, History of migraine headaches - 2014 Personal history of other specified conditions, History of heartburn - 2014 Post-traumatic stress disorder, unspecified, Post-traumatic Stress Disorder - 2014 Encounter for general adult medical examination without abnormal findings, Encounter for preventive health examination    FAMILY HISTORY: Death In The Family Father - Father Death In The Family Mother - Mother Family Health Status Number - Runs In Family   SOCIAL HISTORY: No Social History     Notes: Never A Smoker, Caffeine Use, Marital History - Currently Married, Tobacco Use, Alcohol Use, Occupation:   REVIEW OF SYSTEMS:    GU Review Female:   Patient denies frequent urination, hard to postpone urination, burning /pain with urination, get up at night to urinate,  leakage of urine, stream starts and stops, trouble starting your stream, have to strain to urinate, and being pregnant.  Gastrointestinal (Upper):   Patient denies nausea, vomiting, and indigestion/ heartburn.  Gastrointestinal (Lower):   Patient denies diarrhea and constipation.  Constitutional:   Patient denies fever, night sweats, weight loss, and fatigue.  Skin:   Patient denies skin rash/ lesion and itching.  Eyes:   Patient denies blurred vision and double vision.  Ears/ Nose/ Throat:   Patient denies sore throat and sinus problems.  Hematologic/Lymphatic:   Patient denies swollen glands and easy bruising.  Cardiovascular:   Patient denies leg swelling and chest pains.  Respiratory:   Patient denies cough and shortness of breath.  Endocrine:   Patient denies excessive thirst.  Musculoskeletal:   Patient denies back pain and joint pain.  Neurological:   Patient denies headaches and dizziness.  Psychologic:   Patient denies depression and anxiety.   VITAL SIGNS: None   MULTI-SYSTEM PHYSICAL EXAMINATION:    Constitutional: Well-nourished. No physical deformities. Normally developed. Good grooming.  Gastrointestinal: No mass, no tenderness, no rigidity, non obese abdomen.  Eyes: Normal conjunctivae. Normal eyelids.  Musculoskeletal: Normal gait and station of head and neck.     Complexity of Data:  Source Of History:  Patient  Records Review:   Previous Doctor Records, Previous Patient Records  Urine Test Review:   Urinalysis   PROCEDURES:          Visit Complexity - [phone]  Urinalysis Dipstick Dipstick Cont'd  Color: Amber Bilirubin: Neg mg/dL  Appearance: Clear Ketones: Neg mg/dL  Specific Gravity: 6.962 Blood: Neg ery/uL  pH: <=5.0 Protein: Neg mg/dL  Glucose: Neg mg/dL Urobilinogen: 0.2 mg/dL    Nitrites: Neg    Leukocyte Esterase: Neg leu/uL    ASSESSMENT:      ICD-10 Details  1 GU:   Renal calculus - N20.0 Chronic, Stable  2   Gross hematuria - R31.0  Chronic, Stable   PLAN:           Orders Labs Urine Culture          Document Letter(s):  Created for Patient: Clinical Summary         Notes:   We discussed the management of urinary stones. These options include observation, ureteroscopy, and shockwave lithotripsy. We discussed which options are relevant to these particular stones. We discussed the natural history of stones as well as the complications of untreated stones and the impact on quality of life without treatment as well as with each of the above listed treatments. We also discussed the efficacy of each treatment in its ability to clear the stone burden. With any of these management options I discussed the signs and symptoms of infection and the need for emergent treatment should these be experienced. For each option we discussed the ability of each procedure to clear the patient of their stone burden.   For observation I described the risks which include but are not limited to silent renal damage, life-threatening infection, need for emergent surgery, failure to pass stone, and pain.   For ureteroscopy I described the risks which include heart attack, stroke, pulmonary embolus, death, bleeding, infection, damage to contiguous structures, positioning injury, ureteral stricture, ureteral avulsion, ureteral injury, need for ureteral stent, inability to perform ureteroscopy, need for an interval procedure, inability to clear stone burden, stent discomfort and pain.   For shockwave lithotripsy I described the risks which include arrhythmia, kidney contusion, kidney hemorrhage, need for transfusion, pain, inability to break up stone, inability to pass stone fragments, Steinstrasse, infection associated with obstructing stones, need for different surgical procedure, need for repeat shockwave lithotripsy.   She would like to proceed with ureteroscopy.   CC: Dr. Duanne Guess    Signed by Modena Slater, III, M.D. on 02/28/23 at 9:03 AM (EST

## 2023-03-24 NOTE — Transfer of Care (Signed)
 Immediate Anesthesia Transfer of Care Note  Patient: Kayla [name]  Procedure(s) Performed: CYSTOSCOPY/BILATERAL URETEROSCOPY/HOLMIUM LASER/STENT PLACEMENT (Bilateral)  Patient Location: PACU  Anesthesia Type:General  Level of Consciousness: sedated and responds to stimulation  Airway & Oxygen Therapy: Patient Spontanous Breathing and Patient connected to face mask oxygen  Post-op Assessment: Report given to RN  Post vital signs: stable  Last Vitals:  Vitals Value Taken Time  BP 128/67 03/24/23 1515  Temp 36.4 C 03/24/23 1508  Pulse 88 03/24/23 1515  Resp 11 03/24/23 1515  SpO2 100 % 03/24/23 1515  Vitals shown include unfiled device data.  Last Pain:  Vitals:   03/24/23 1508  TempSrc:   PainSc: 0-No pain      Patients Stated Pain Goal: 4 (03/24/23 1158)  Complications: No notable events documented.

## 2023-03-25 ENCOUNTER — Encounter (HOSPITAL_COMMUNITY): Payer: Self-pay | Admitting: Urology

## 2023-03-26 ENCOUNTER — Encounter (HOSPITAL_COMMUNITY): Payer: Self-pay | Admitting: Urology

## 2023-03-26 NOTE — Progress Notes (Signed)
 Patient phoned to give updated information on surgery.  Date of Surgery - 03-31-23  Arrival Time - 6:45 and check in at admitting.    NPO Status - patient reminded to not eat solid food or drink liquids after midnight.  Medications morning of surgery - Duloxetine, Famotidine, Pregabalin, eyedrops.  If needed Hydrocodone, Ondansetron and Pyridium.    No change in medical history, allergies per patient.  Only complaint was pain following prior procedure, but patient stated that she had already talked to her surgeon and was given additional pain medications.    Transportation home - Karolyne Timmons [phone]  All questions answered and patient stated understanding

## 2023-03-31 ENCOUNTER — Encounter (HOSPITAL_COMMUNITY)
Admission: RE | Disposition: A | Discharge status: Home / Self Care | Payer: Self-pay | Source: Home / Self Care | Attending: Urology

## 2023-03-31 ENCOUNTER — Ambulatory Visit (HOSPITAL_BASED_OUTPATIENT_CLINIC_OR_DEPARTMENT_OTHER): Admitting: Anesthesiology

## 2023-03-31 ENCOUNTER — Other Ambulatory Visit: Payer: Self-pay

## 2023-03-31 ENCOUNTER — Ambulatory Visit (HOSPITAL_COMMUNITY): Payer: Self-pay | Admitting: Physician Assistant

## 2023-03-31 ENCOUNTER — Ambulatory Visit (HOSPITAL_COMMUNITY)
Admission: RE | Admit: 2023-03-31 | Discharge: 2023-03-31 | Disposition: A | Discharge status: Home / Self Care | Attending: Urology | Admitting: Urology

## 2023-03-31 ENCOUNTER — Encounter (HOSPITAL_COMMUNITY): Payer: Self-pay | Admitting: Urology

## 2023-03-31 DIAGNOSIS — M797 Fibromyalgia: Secondary | ICD-10-CM | POA: Insufficient documentation

## 2023-03-31 DIAGNOSIS — Z466 Encounter for fitting and adjustment of urinary device: Secondary | ICD-10-CM

## 2023-03-31 DIAGNOSIS — Z87440 Personal history of urinary (tract) infections: Secondary | ICD-10-CM | POA: Diagnosis not present

## 2023-03-31 DIAGNOSIS — E1122 Type 2 diabetes mellitus with diabetic chronic kidney disease: Secondary | ICD-10-CM | POA: Diagnosis not present

## 2023-03-31 DIAGNOSIS — N200 Calculus of kidney: Secondary | ICD-10-CM | POA: Diagnosis not present

## 2023-03-31 DIAGNOSIS — F419 Anxiety disorder, unspecified: Secondary | ICD-10-CM | POA: Diagnosis not present

## 2023-03-31 DIAGNOSIS — I341 Nonrheumatic mitral (valve) prolapse: Secondary | ICD-10-CM | POA: Insufficient documentation

## 2023-03-31 DIAGNOSIS — N189 Chronic kidney disease, unspecified: Secondary | ICD-10-CM | POA: Insufficient documentation

## 2023-03-31 DIAGNOSIS — I10 Essential (primary) hypertension: Secondary | ICD-10-CM

## 2023-03-31 DIAGNOSIS — E119 Type 2 diabetes mellitus without complications: Secondary | ICD-10-CM

## 2023-03-31 DIAGNOSIS — I129 Hypertensive chronic kidney disease with stage 1 through stage 4 chronic kidney disease, or unspecified chronic kidney disease: Secondary | ICD-10-CM | POA: Insufficient documentation

## 2023-03-31 DIAGNOSIS — Z01818 Encounter for other preprocedural examination: Secondary | ICD-10-CM

## 2023-03-31 DIAGNOSIS — Z9884 Bariatric surgery status: Secondary | ICD-10-CM | POA: Insufficient documentation

## 2023-03-31 HISTORY — PX: CYSTOSCOPY W/ URETERAL STENT REMOVAL: SHX1430

## 2023-03-31 LAB — BASIC METABOLIC PANEL
Anion gap: 8 (ref 5–15)
BUN: 38 mg/dL — ABNORMAL HIGH (ref 8–23)
CO2: 25 mmol/L (ref 22–32)
Calcium: 8.6 mg/dL — ABNORMAL LOW (ref 8.9–10.3)
Chloride: 108 mmol/L (ref 98–111)
Creatinine, Ser: 1.18 mg/dL — ABNORMAL HIGH (ref 0.44–1.00)
GFR, Estimated: 48 mL/min — ABNORMAL LOW (ref >60)
Glucose, Bld: 100 mg/dL — ABNORMAL HIGH (ref 70–99)
Potassium: 4.4 mmol/L (ref 3.5–5.1)
Sodium: 141 mmol/L (ref 135–145)

## 2023-03-31 LAB — GLUCOSE, CAPILLARY: Glucose-Capillary: 92 mg/dL (ref 70–99)

## 2023-03-31 SURGERY — CYST REMOVAL
Anesthesia: Regional | Laterality: Left

## 2023-03-31 SURGERY — REMOVAL, STENT, URETER, CYSTOSCOPIC
Anesthesia: General | Laterality: Bilateral

## 2023-03-31 MED ORDER — DEXAMETHASONE SODIUM PHOSPHATE 10 MG/ML IJ SOLN
INTRAMUSCULAR | Status: DC | PRN
Start: 2023-03-31 — End: 2023-03-31
  Administered 2023-03-31: 10 mg via INTRAVENOUS

## 2023-03-31 MED ORDER — DEXAMETHASONE SODIUM PHOSPHATE 10 MG/ML IJ SOLN
INTRAMUSCULAR | Status: AC
  Filled 2023-03-31: qty 1

## 2023-03-31 MED ORDER — PHENYLEPHRINE 80 MCG/ML (10ML) SYRINGE FOR IV PUSH (FOR BLOOD PRESSURE SUPPORT)
PREFILLED_SYRINGE | INTRAVENOUS | Status: DC | PRN
  Administered 2023-03-31: 80 ug via INTRAVENOUS

## 2023-03-31 MED ORDER — ORAL CARE MOUTH RINSE: 15.0000 mL | Freq: Once | OROMUCOSAL | Status: AC

## 2023-03-31 MED ORDER — LIDOCAINE HCL (PF) 2 % IJ SOLN
INTRAMUSCULAR | Status: AC
  Filled 2023-03-31: qty 5

## 2023-03-31 MED ORDER — LACTATED RINGERS IV SOLN: INTRAVENOUS | Status: DC

## 2023-03-31 MED ORDER — FENTANYL CITRATE (PF) 100 MCG/2ML IJ SOLN
INTRAMUSCULAR | Status: AC
  Filled 2023-03-31: qty 2

## 2023-03-31 MED ORDER — ONDANSETRON HCL 4 MG/2ML IJ SOLN
INTRAMUSCULAR | Status: DC | PRN
  Administered 2023-03-31: 4 mg via INTRAVENOUS

## 2023-03-31 MED ORDER — PROPOFOL 500 MG/50ML IV EMUL
INTRAVENOUS | Status: DC | PRN
  Administered 2023-03-31: 50 mg via INTRAVENOUS
  Administered 2023-03-31: 40 mg via INTRAVENOUS
  Administered 2023-03-31: 100 ug/kg/min via INTRAVENOUS

## 2023-03-31 MED ORDER — SODIUM CHLORIDE 0.9 % IV SOLN: 12.5000 mg | INTRAVENOUS | Status: DC | PRN

## 2023-03-31 MED ORDER — SODIUM CHLORIDE 0.9 % IR SOLN
Status: DC | PRN
  Administered 2023-03-31: 3000 mL

## 2023-03-31 MED ORDER — ONDANSETRON HCL 4 MG/2ML IJ SOLN
INTRAMUSCULAR | Status: AC
  Filled 2023-03-31: qty 2

## 2023-03-31 MED ORDER — SODIUM CHLORIDE 0.9 % IV SOLN
2.0000 g | INTRAVENOUS | Status: AC
  Administered 2023-03-31: 2 g via INTRAVENOUS
  Filled 2023-03-31: qty 20

## 2023-03-31 MED ORDER — AMISULPRIDE (ANTIEMETIC) 5 MG/2ML IV SOLN: 10.0000 mg | Freq: Once | INTRAVENOUS | Status: DC | PRN

## 2023-03-31 MED ORDER — CHLORHEXIDINE GLUCONATE 0.12 % MT SOLN
15.0000 mL | Freq: Once | OROMUCOSAL | Status: AC
  Administered 2023-03-31: 15 mL via OROMUCOSAL

## 2023-03-31 MED ORDER — PROPOFOL 10 MG/ML IV BOLUS
INTRAVENOUS | Status: AC
  Filled 2023-03-31: qty 20

## 2023-03-31 MED ORDER — PROPOFOL 500 MG/50ML IV EMUL
INTRAVENOUS | Status: AC
  Filled 2023-03-31: qty 50

## 2023-03-31 MED ORDER — HYDROMORPHONE HCL 1 MG/ML IJ SOLN: 0.2500 mg | INTRAMUSCULAR | Status: DC | PRN

## 2023-03-31 MED ORDER — MIDAZOLAM HCL 5 MG/5ML IJ SOLN
INTRAMUSCULAR | Status: DC | PRN
  Administered 2023-03-31 (×2): 1 mg via INTRAVENOUS

## 2023-03-31 MED ORDER — PHENYLEPHRINE 80 MCG/ML (10ML) SYRINGE FOR IV PUSH (FOR BLOOD PRESSURE SUPPORT)
PREFILLED_SYRINGE | INTRAVENOUS | Status: AC
  Filled 2023-03-31: qty 10

## 2023-03-31 MED ORDER — INSULIN ASPART 100 UNIT/ML IJ SOLN: 0.0000 [IU] | INTRAMUSCULAR | Status: DC | PRN

## 2023-03-31 MED ORDER — DEXAMETHASONE SODIUM PHOSPHATE 4 MG/ML IJ SOLN
INTRAMUSCULAR | Status: DC | PRN
Start: 2023-03-31 — End: 2023-03-31
  Administered 2023-03-31: 8 mg via INTRAVENOUS

## 2023-03-31 MED ORDER — MIDAZOLAM HCL 2 MG/2ML IJ SOLN
INTRAMUSCULAR | Status: AC
  Filled 2023-03-31: qty 2

## 2023-03-31 SURGICAL SUPPLY — 11 items
BAG URO CATCHER STRL LF (MISCELLANEOUS) ×1 IMPLANT
CATH URETL OPEN END 6FR 70 (CATHETERS) ×1 IMPLANT
CLOTH BEACON ORANGE TIMEOUT ST (SAFETY) ×1 IMPLANT
GLOVE BIO SURGEON STRL SZ7.5 (GLOVE) ×1 IMPLANT
GOWN STRL REUS W/ TWL XL LVL3 (GOWN DISPOSABLE) ×1 IMPLANT
GUIDEWIRE STR DUAL SENSOR (WIRE) ×1 IMPLANT
KIT TURNOVER KIT A (KITS) IMPLANT
MANIFOLD NEPTUNE II (INSTRUMENTS) ×1 IMPLANT
PACK CYSTO (CUSTOM PROCEDURE TRAY) ×1 IMPLANT
TUBING CONNECTING 10 (TUBING) ×1 IMPLANT
TUBING UROLOGY SET (TUBING) IMPLANT

## 2023-03-31 NOTE — Transfer of Care (Signed)
 Immediate Anesthesia Transfer of Care Note  Patient: JILLANE PO  Procedure(s) Performed: CYSTOSCOPY WITH BILATERAL STENT REMOVAL (Bilateral)  Patient Location: PACU  Anesthesia Type:MAC  Level of Consciousness: drowsy  Airway & Oxygen Therapy: Patient Spontanous Breathing and Patient connected to face mask oxygen  Post-op Assessment: Report given to RN and Post -op Vital signs reviewed and stable  Post vital signs: Reviewed and stable  Last Vitals:  Vitals Value Taken Time  BP 96/61 03/31/23 0937  Temp    Pulse 73 03/31/23 0941  Resp 10 03/31/23 0941  SpO2 96 % 03/31/23 0941  Vitals shown include unfiled device data.  Last Pain:  Vitals:   03/31/23 0653  TempSrc: Oral  PainSc:          Complications: No notable events documented.

## 2023-03-31 NOTE — Anesthesia Preprocedure Evaluation (Addendum)
 Anesthesia Evaluation  Patient identified by MRN, date of birth, ID band Patient awake    Reviewed: Allergy & Precautions, NPO status , Patient's Chart, lab work & pertinent test results  Airway Mallampati: II  TM Distance: >3 FB Neck ROM: Full    Dental no notable dental hx. (+) Teeth Intact, Dental Advisory Given   Pulmonary neg pulmonary ROS   Pulmonary exam normal breath sounds clear to auscultation       Cardiovascular hypertension, + angina  + Peripheral Vascular Disease  Normal cardiovascular exam Rhythm:Regular Rate:Normal  Echo 04/09/2022  1. Left ventricular ejection fraction, by estimation, is 60 to 65%. The  left ventricle has normal function. The left ventricle has no regional  wall motion abnormalities. GLS -23.5%   2. Right ventricular systolic function is normal. The right ventricular  size is normal.   3. The mitral valve is normal in structure. Trivial mitral valve  regurgitation. No evidence of mitral stenosis.   4. The aortic valve is normal in structure. Aortic valve regurgitation is  not visualized. No aortic stenosis is present.   5. The inferior vena cava is normal in size with greater than 50%  respiratory variability, suggesting right atrial pressure of 3 mmHg.    Myocardial Perfusion 07/20/2020  There was no ST segment deviation noted during stress.  The left ventricular ejection fraction is normal (55-65%).  Nuclear stress EF: 65%.  The study is normal.  This is a low risk study.    Neuro/Psych  Headaches PSYCHIATRIC DISORDERS Anxiety        GI/Hepatic hiatal hernia,GERD  ,,(+)     substance abuse (fentanyl patch 94mcg/hr, not currently wearing. Norco 10/325 1/2 pill q3hrs)    Endo/Other  diabetes, Well Controlled    Renal/GU Renal InsufficiencyRenal disease  negative genitourinary   Musculoskeletal  (+) Arthritis ,  Fibromyalgia -, narcotic dependentS/p spinal cord stimulator,  currently off   Abdominal   Peds  Hematology negative hematology ROS (+)   Anesthesia Other Findings   Reproductive/Obstetrics                             Anesthesia Physical Anesthesia Plan  ASA: 3  Anesthesia Plan: MAC   Post-op Pain Management:    Induction: Intravenous  PONV Risk Score and Plan: 2 and Ondansetron, Treatment may vary due to age or medical condition and Midazolam  Airway Management Planned: Simple Face Mask  Additional Equipment:   Intra-op Plan:   Post-operative Plan: Extubation in OR  Informed Consent: I have reviewed the patients History and Physical, chart, labs and discussed the procedure including the risks, benefits and alternatives for the proposed anesthesia with the patient or authorized representative who has indicated his/her understanding and acceptance.     Dental advisory given  Plan Discussed with: CRNA  Anesthesia Plan Comments:        Anesthesia Quick Evaluation

## 2023-03-31 NOTE — Op Note (Signed)
 Operative Note  Preoperative diagnosis:  1.  Bilateral retained ureteral stents  Postoperative diagnosis: 1.  Bilateral retained ureteral stents  Procedure(s): 1.  Cystoscopy with bilateral ureteral stent removal  Surgeon: Modena Slater, MD  Assistants: None  Anesthesia: General  Complications: None immediate  EBL: Minimal  Specimens: 1.  None  Drains/Catheters: 1.  None  Intraoperative findings: Normal urethra and bladder.  Bilateral ureteral stents removed  Indication: 78 year old female status post bilateral ureteroscopy with laser lithotripsy and ureteral stent placements presents for stent removal  Description of procedure:  The patient was identified and consent was obtained.  The patient was taken to the operating room and placed in the supine position.  The patient was placed under general anesthesia.  Perioperative antibiotics were administered.  The patient was placed in dorsal lithotomy.  Patient was prepped and draped in a standard sterile fashion and a timeout was performed.  A 21 French rigid cystoscope was Faylene Million into the urethra and into the bladder.  Complete cystoscopy was performed with no abnormal findings.  The left stent was grasped and removed.  I reinserted the scope and removed the right stent.  I reinserted the scope again and emptied the bladder and remove the scope.  This concluded the operation.  Patient tolerated the procedure well was stable postoperatively.  Plan: Follow-up in 1 month with renal ultrasound and KUB

## 2023-03-31 NOTE — Anesthesia Postprocedure Evaluation (Signed)
 Anesthesia Post Note  Patient: Kayla [name]  Procedure(s) Performed: CYSTOSCOPY WITH BILATERAL STENT REMOVAL (Bilateral)     Patient location during evaluation: PACU Anesthesia Type: General Level of consciousness: awake and alert Pain management: pain level controlled Vital Signs Assessment: post-procedure vital signs reviewed and stable Respiratory status: spontaneous breathing, nonlabored ventilation and respiratory function stable Cardiovascular status: blood pressure returned to baseline and stable Postop Assessment: no apparent nausea or vomiting Anesthetic complications: no   No notable events documented.  Last Vitals:  Vitals:   03/31/23 1015 03/31/23 1034  BP: 110/72 126/70  Pulse: 86 79  Resp: 15 15  Temp: 36.6 C 36.5 C  SpO2: 95% 97%    Last Pain:  Vitals:   03/31/23 1034  TempSrc:   PainSc: 0-No pain                 Lowella Curb

## 2023-03-31 NOTE — Discharge Instructions (Addendum)

## 2023-03-31 NOTE — Anesthesia Procedure Notes (Signed)
 Procedure Name: MAC Date/Time: 03/31/2023 9:06 AM  Performed by: Maurene Capes, CRNAPre-anesthesia Checklist: Patient identified, Emergency Drugs available, Suction available and Patient being monitored Patient Re-evaluated:Patient Re-evaluated prior to induction Oxygen Delivery Method: Simple face mask Preoxygenation: Pre-oxygenation with 100% oxygen Induction Type: IV induction Placement Confirmation: positive ETCO2 Dental Injury: Teeth and Oropharynx as per pre-operative assessment

## 2023-03-31 NOTE — H&P (Signed)
 H&P  Chief Complaint: Bilateral renal calculi  History of Present Illness: 78 year old female status post bilateral ureteroscopy with laser lithotripsy and ureteral stent placement presents for stent removal  Past Medical History:  Diagnosis Date   Adjustment disorder with depressed mood 03/18/2007   Allergy    Anal fissure 10/08/2016   Angina    takes Propanolol prn and it stops her chest   Anorexia nervosa 11/11/2019   Barrett esophagus    not consistently present   Cataract    removed  but had retinal detachment - repeat cataract right eye   Cervical facet joint syndrome 10/08/2016   Chronic constipation 10/04/2010   Chronic interstitial cystitis 10/16/2011   Chronic kidney disease    kidney stones and UTI   Chronic maxillary sinusitis 09/15/2013   Chronic narcotic dependence 10/03/2018   Complication of anesthesia    either  BP or pulse dropped with last surgery   Degeneration of cervical intervertebral disc 03/18/2007   Degeneration of lumbar or lumbosacral intervertebral disc 03/18/2007   Diaphragmatic hernia 03/18/2007   Dysrhythmia    brought on by stress   Epigastric pain 11/11/2019   Esophageal dysphagia 10/03/2018   External hemorrhoids    Fibromyalgia    neuropathy knees ankles and toes, bladder   Generalized anxiety disorder 03/18/2007   GERD (gastroesophageal reflux disease) 03/18/2007   HA (headache)-chronic/tension type 07/28/2012   Heart murmur    Hiatal hernia    History of gastric bypass 02/11/2014   1999 at [facility]   History of hyperparathyroidism 01/22/2016   History of kidney stones    Hypertension    No longer on meds   Hypoglycemia 11/08/2016   IBS (irritable bowel syndrome)    Internal hemorrhoids    Kidney stones    multiple per patient   LLQ abdominal pain 11/11/2019   Macular degeneration    Malnutrition 11/11/2019   Mechanical breakdown implanted electronic neurostimulator spinal cord 11/16/2018   Mitral regurgitation  12/11/2015   MVP (mitral valve prolapse) 12/17/2017   Myalgia 03/18/2007   Nephrolithiasis 07/28/2012   lithotrip 2013-[place],MD   Neuropathic pain 01/23/2018   Orthostatic hypotension 11/20/2016   Osteoarthritis    all over   Osteopetrosis    Osteoporosis 07/28/2012   Peripheral vascular disease    superficial phlebitis in left calf   Personal history of colonic polyps 07/22/2013   07/2013 - 3 small adenomas - repeat colonoscopy 2018   PTSD (post-traumatic stress disorder)    Reactive hypoglycemia    Rosacea 11/02/2017   Small intestinal bacterial overgrowth 08/05/2017   + lactulose breath test 06/2017 Xifaxan Rx   Type II diabetes mellitus 11/18/2016   Pt states now diagnosed with reactive hypoglycemia   Urinary tract infection    Past Surgical History:  Procedure Laterality Date   ABDOMINAL HYSTERECTOMY     APPENDECTOMY     CARDIAC CATHETERIZATION  03/22/1991   EF 61%   CARDIOVASCULAR STRESS TEST  10/03/2006   CERVICAL SPINE SURGERY  02/14/2022   BLOCK   CHOLECYSTECTOMY     COLONOSCOPY  06/23/2008   normal   CYSTOSCOPY/URETEROSCOPY/HOLMIUM LASER/STENT PLACEMENT Bilateral 03/24/2023   Procedure: CYSTOSCOPY/BILATERAL URETEROSCOPY/HOLMIUM LASER/STENT PLACEMENT;  Surgeon: Crista Elliot, MD;  Location: [facility];  Service: Urology;  Laterality: Bilateral;  90 MINUTE CASE   DILATION AND CURETTAGE OF UTERUS     x2   ESOPHAGUS SURGERY     EXPLORATORY LAPAROTOMY  1993   EYE SURGERY     GASTRIC BYPASS  1999   GASTRIC RESTRICTION SURGERY  1991   LASIK Bilateral    Right Arm Surgery     Right Knee Arthroscopy     Rt wrist fx  2009   SPINAL CORD STIMULATOR IMPLANT     spinal surgery battery implant     stomach stappeling  1991   STONE EXTRACTION WITH BASKET  2012   TONSILLECTOMY     TUBAL LIGATION     UPPER GASTROINTESTINAL ENDOSCOPY  06/23/2008   w/Dil, Barrett's esophagus   US ECHOCARDIOGRAPHY  01/21/2007   EF 55-60%   US ECHOCARDIOGRAPHY  08/30/2003   EF 55-60%     Home Medications:  Medications Prior to Admission  Medication Sig Dispense Refill Last Dose/Taking   aspirin-acetaminophen-caffeine (EXCEDRIN MIGRAINE) 250-250-65 MG tablet Take 1 tablet by mouth in the morning, at noon, in the evening, and at bedtime.   03/31/2023 at  5:00 AM   Biotin 1000 MCG CHEW Chew 1,000 mcg by mouth daily.   Past Month   celecoxib (CELEBREX) 200 MG capsule Take 200 mg by mouth daily.   Past Month   COLLAGEN PO Take 1 each by mouth daily.   Past Month   cycloSPORINE (RESTASIS) 0.05 % ophthalmic emulsion Place 1 drop into both eyes 2 (two) times daily. 5.5 mL 5 Taking   diclofenac Sodium (VOLTAREN) 1 % GEL Apply 2 to 4 gram to the affected area(s) (painful joints) up to 4 times daily as needed. 600 g 1 Past Month   dicyclomine (BENTYL) 10 MG capsule Take 1-2 tab 3 times daily AC as needed for spasms and cramping. 270 capsule 1 Past Week   Docusate Sodium (DSS) 100 MG CAPS Take 100 mg by mouth daily.   Past Week   DULoxetine (CYMBALTA) 30 MG capsule Take 1 capsule (30 mg total) by mouth 2 (two) times daily. 180 capsule 2 03/31/2023 at  6:00 AM   famotidine (PEPCID) 20 MG tablet Take 20 mg by mouth daily as needed for heartburn or indigestion.   Past Week   fentaNYL (DURAGESIC) 25 MCG/HR Place 1 patch onto the skin every 3 (three) days. Change every 3 days 10 patch 0 Past Week   Galcanezumab-gnlm (EMGALITY) 120 MG/ML SOAJ Inject 120 mg into the skin every 28 (twenty-eight) days. 3 mL 3 Past Month   glucagon 1 MG injection Inject 1 mg into the muscle once as needed for up to 1 dose. 1 each 12 Taking As Needed   HYDROcodone-acetaminophen (NORCO) 10-325 MG tablet Take 0.5-1 tablets by mouth 4 (four) times daily as needed for pain. Max 4 tablets by mouth daily 120 tablet 0 03/31/2023 at  5:00 AM   hydrocortisone cream 1 % Apply 1 Application topically daily as needed for itching.   Past Month   hypromellose (GENTEAL) 0.3 % GEL ophthalmic ointment Place 1 drop into both eyes at  bedtime. Reported on 02/11/2015   03/30/2023   latanoprost (XALATAN) 0.005 % ophthalmic solution Place 1 drop into both eyes at bedtime.   03/30/2023   lidocaine (LIDODERM) 5 % Place 1 patch onto the skin as needed. Remove & Discard patch within 12 hours. May dispense as 3 month supply (Patient taking differently: Place 1 patch onto the skin daily as needed (pain). Remove & Discard patch within 12 hours. May dispense as 3 month supply) 90 patch 3 03/30/2023   magnesium hydroxide (MILK OF MAGNESIA) 400 MG/5ML suspension Take 5 mLs by mouth daily as needed for moderate constipation.   Past Week  metroNIDAZOLE (METROGEL) 1 % gel Apply topically daily. 45 g 0 03/30/2023   naloxegol oxalate (MOVANTIK) 25 MG TABS tablet Take 25 mg by mouth daily.   Past Week   ondansetron (ZOFRAN) 8 MG tablet Take 1 tablet (8 mg total) by mouth every 8 (eight) hours as needed for nausea or vomiting. 60 tablet 0 03/30/2023   OVER THE COUNTER MEDICATION Take 1 Scoop by mouth daily. Copper, Selenium and Zinc Powder   Past Week   phenazopyridine (PYRIDIUM) 95 MG tablet Take 95 mg by mouth 3 (three) times daily as needed for pain.   Past Week   pregabalin (LYRICA) 50 MG capsule Take 1 capsule in morning and 2 capsules at night (Patient taking differently: Take 50 mg by mouth 2 (two) times daily.) 270 capsule 3 03/31/2023 at  6:00 AM   Probiotic Product (PROBIOTIC-10 PO) Take 1 capsule by mouth daily.   Past Week   rizatriptan (MAXALT-MLT) 10 MG disintegrating tablet Take 1 tablet (10 mg total) by mouth as needed for migraine. May repeat in 2 hours if needed 27 tablet 3 03/30/2023   solifenacin (VESICARE) 5 MG tablet Take 5 mg by mouth daily.   Past Week   sucralfate (CARAFATE) 1 GM/10ML suspension Take 10 mLs (1 g total) by mouth 4 (four) times daily. (Patient taking differently: Take 1 g by mouth daily as needed (acid reflux).) 420 mL 1 03/30/2023   Baclofen 5 MG TABS Take 1 tablet (5 mg total) by mouth 3 (three) times daily as needed.  30 tablet 5 More than a month   Continuous Blood Gluc Receiver (DEXCOM G7 RECEIVER) DEVI by Does not apply route.      fentaNYL (DURAGESIC) 25 MCG/HR Place 1 patch onto the skin every 3 (three) days. 10 patch 0    HYDROcodone-acetaminophen (NORCO) 10-325 MG tablet Take 1/2 to 1 tablet four times daily, if needed for pain, max daily dose: 4 Tablet (Patient not taking: Reported on 03/11/2023) 120 tablet 0 Not Taking   HYDROcodone-acetaminophen (NORCO) 10-325 MG tablet Take 0.5-1 tablets by mouth 4 (four) times daily as needed for pain. max 4 tabs daily (Patient taking differently: Take 0.5 tablets by mouth every 3 (three) hours as needed for moderate pain (pain score 4-6).) 120 tablet 0    mirabegron ER (MYRBETRIQ) 25 MG TB24 tablet Take 1 tablet (25 mg total) by mouth daily. (Patient not taking: Reported on 03/11/2023) 90 tablet 1    Allergies:  Allergies  Allergen Reactions   Ambien [Zolpidem Tartrate]     Hallucinations   Codeine Anaphylaxis   Oxycodone-Acetaminophen Shortness Of Breath   Tylox [Oxycodone-Acetaminophen] Anaphylaxis    Chest pain   Zolpidem Tartrate Anaphylaxis and Other (See Comments)    Other Reaction(s): Hallucination  Other reaction(s): Other, hallucinations, Other reaction(s): Other, hallucinations, hallucinations   Celebrex [Celecoxib] Other (See Comments)    Stomach cramping   Darvocet [Propoxyphene N-Acetaminophen]     Chest pain   Darvon [Propoxyphene] Itching   Lorcet [Hydrocodone-Acetaminophen]     Chest pain   Opium     hallucinations   Vicodin [Hydrocodone-Acetaminophen] Other (See Comments)    Chest Pain    Wheat    Amoxicillin Nausea And Vomiting    Reaction:  Migraine headache   Penicillins Nausea And Vomiting    Migraine Headaches    Family History  Problem Relation Age of Onset   Stroke Mother    Lung cancer Mother    Heart disease Mother    Diabetes  Mother    Hypertension Father    Stroke Father    Heart disease Father    Kidney disease  Father    Seizures Father        epilepsy, and sisters x 2   Memory loss Father    Lung cancer Sister    Breast cancer Sister    Cancer - Lung Sister    Heart disease Sister    Diabetes Sister    Pancreatic cancer Sister    Healthy Daughter    Healthy Son    Colon cancer Maternal Aunt        12 relatives   Colon cancer Paternal Aunt    Colon cancer Paternal Aunt    Uterine cancer Other        aunt   Heart disease Other        grandmother   Esophageal cancer Neg Hx    Rectal cancer Neg Hx    Stomach cancer Neg Hx    Colon polyps Neg Hx    Social History:  reports that she has never smoked. She has been exposed to tobacco smoke. She has never used smokeless tobacco. She reports that she does not drink alcohol and does not use drugs.  ROS: A complete review of systems was performed.  All systems are negative except for pertinent findings as noted. ROS   Physical Exam:  Vital signs in last 24 hours: Temp:  [98 F (36.7 C)] 98 F (36.7 C) (03/17 0653) Pulse Rate:  [87] 87 (03/17 0653) Resp:  [15] 15 (03/17 0653) BP: (115)/(60) 115/60 (03/17 0653) SpO2:  [98 %] 98 % (03/17 0653) Weight:  [47.6 kg] 47.6 kg (03/17 0652) General:  Alert and oriented, No acute distress HEENT: Normocephalic, atraumatic Neck: No JVD or lymphadenopathy Cardiovascular: Regular rate and rhythm Lungs: Regular rate and effort Abdomen: Soft, nontender, nondistended, no abdominal masses Back: No CVA tenderness Extremities: No edema Neurologic: Grossly intact  Laboratory Data:  Results for orders placed or performed during the hospital encounter of 03/31/23 (from the past 24 hours)  Glucose, capillary     Status: None   Collection Time: 03/31/23  7:11 AM  Result Value Ref Range   Glucose-Capillary 92 70 - 99 mg/dL  Basic metabolic panel per protocol     Status: Abnormal   Collection Time: 03/31/23  7:30 AM  Result Value Ref Range   Sodium 141 135 - 145 mmol/L   Potassium 4.4 3.5 - 5.1 mmol/L    Chloride 108 98 - 111 mmol/L   CO2 25 22 - 32 mmol/L   Glucose, Bld 100 (H) 70 - 99 mg/dL   BUN 38 (H) 8 - 23 mg/dL   Creatinine, Ser 6.04 (H) 0.44 - 1.00 mg/dL   Calcium 8.6 (L) 8.9 - 10.3 mg/dL   GFR, Estimated 48 (L) >60 mL/min   Anion gap 8 5 - 15   *Note: Due to a large number of results and/or encounters for the requested time period, some results have not been displayed. A complete set of results can be found in Results Review.   No results found for this or any previous visit (from the past 240 hours). Creatinine: Recent Labs    03/31/23 0730  CREATININE 1.18*    Impression/Assessment:  Bilateral renal calculi  Plan:  Proceed with cystoscopy with bilateral ureteral stent removal.  Ray Church, III 03/31/2023, 8:31 AM

## 2023-03-31 NOTE — Interval H&P Note (Signed)
 History and Physical Interval Note:  03/31/2023 8:30 AM  Kayla [name]  has presented today for surgery, with the diagnosis of BILATERAL RENAL STONES.  The various methods of treatment have been discussed with the patient and family. After consideration of risks, benefits and other options for treatment, the patient has consented to  Procedure(s) with comments: CYSTOSCOPY WITH BILATERAL STENT REMOVAL (Bilateral) - 30 MINUTE CASE as a surgical intervention.  The patient's history has been reviewed, patient examined, no change in status, stable for surgery.  I have reviewed the patient's chart and labs.  Questions were answered to the patient's satisfaction.     Ray Church, III

## 2023-04-01 ENCOUNTER — Encounter (HOSPITAL_COMMUNITY): Payer: Self-pay | Admitting: Urology

## 2023-04-11 ENCOUNTER — Other Ambulatory Visit (HOSPITAL_BASED_OUTPATIENT_CLINIC_OR_DEPARTMENT_OTHER): Payer: Self-pay

## 2023-04-11 MED ORDER — FENTANYL 25 MCG/HR TD PT72
1.0000 | MEDICATED_PATCH | TRANSDERMAL | 0 refills | Status: DC
  Filled 2023-04-16: qty 10, 30d supply, fill #0

## 2023-04-11 MED ORDER — HYDROCODONE-ACETAMINOPHEN 10-325 MG PO TABS
0.5000 | ORAL_TABLET | Freq: Four times a day (QID) | ORAL | 0 refills | Status: DC | PRN
  Filled 2023-04-16: qty 120, 30d supply, fill #0

## 2023-04-15 ENCOUNTER — Other Ambulatory Visit: Payer: Self-pay | Admitting: Rheumatology

## 2023-04-15 ENCOUNTER — Other Ambulatory Visit

## 2023-04-15 DIAGNOSIS — M810 Age-related osteoporosis without current pathological fracture: Secondary | ICD-10-CM

## 2023-04-16 ENCOUNTER — Other Ambulatory Visit (HOSPITAL_BASED_OUTPATIENT_CLINIC_OR_DEPARTMENT_OTHER): Payer: Self-pay

## 2023-04-17 ENCOUNTER — Other Ambulatory Visit (HOSPITAL_BASED_OUTPATIENT_CLINIC_OR_DEPARTMENT_OTHER): Payer: Self-pay

## 2023-04-21 ENCOUNTER — Encounter: Payer: Self-pay | Admitting: Cardiovascular Disease

## 2023-04-21 NOTE — Progress Notes (Unsigned)
 Kayla [name] Date of Birth  Dec 31, 1945       [facility] Office [address], [address]  [address] [place], [place]  [address]   [place], [place]  [address] [phone]     [phone]   Fax  [phone]    Fax [phone]  Problem List: 1. Mitral valve prolapse-mitral regurgitation 2. Hypertension 3. Barrett's esophagus 4. Fibromyalgia 5. History chest pain-she had a normal heart catheterization in 1993. Stress Myoview study in 2008 was normal. 6. Brain aneurism -  7. S/p Roux-en-Y surgery  for Barretts esophagus  Previous notes  Kayla [name] continues to have chest pain.  She takes PRN propranolol which seems to help.  It typically occurs in the afternoon.  Almost always with rest.  She does not get any regular exercise.  She has had lots of bladder infections due to kidney stones recently that have not permitted her to exercise.  She's had lots of problems with anxiety.  July 15, 2014:  Kayla [name] presents for evaluation Her BP has been very low - possibly due to the various medications .  She has had left sided chest pain for years.  Has had a normal cath in the remote past and had a normal myoview  Several years ago .   She was recently seen by Dr. Theotis Barrio for some palpitations The palpitations would come and go .  Off and on for hours.  Was started on atenolol which has helped the palpitations. She had taken atenolol for years ( many years ago) but was off atenolol for several years.  Her palpitations have resolved after starting the atenolol .    These episodes of tachycardia are at times associated with episodes of CP . Has lost a lot of weight over the past several years. She had a Roux -in - Y gastric bypass ( supposedly to prevent esophageal cancer from Barretts esophagutis. Is not able to eat much.  Can eat 1/4 cup of food at a time .    Sept. 19, 2016:  Continues to have palpitations  - improve with  propranolol Continues to have poor appetite.  Is generally very weak Continues to see her primary medical doctor and her pain management doctor .   Nov. 27,2017:  Seems to be doing well from a cardiac standpoint. Having urology issues.  May need a bladder tack. Still has   Nov. 7, 2018 Has been diagnosed with DM and has diabetic neuropathy.   Lots of pain in her feet.   Still has some CP Still has some palpittations  .   Has lost 40 lbs over the past 10 years.  Admitted that she is not eating well.   July 02, 2017: Follow up for MVP and noncardiac CP  Hx of HTN but has been low for the past several years.   Doing well. Now has a Freestyle Libra glucose monitor  Doing well with that  Continues to lose weight Wt is 110 lbs. Down 6 lbs from last Nov.  Not eating well.   Needs to eat more protein   December 17, 2017: Akesha is seen today for follow-up visit.  She is had problems with orthostatic hypotension.  We felt that she may have some degree of dysautonomia.  It is clear that her diet is not very good. Her weight today is 101 pounds.  This is down 9 pounds from her previous visit.  Her symptoms of orthostasis  have improved Develops palpitations after eating   May 05, 2019:  Kayla [name] is seen today for follow up of her MVP and orthostatic hypotension .  Wt is 108 lbs today ( up 7 lbs from last visit ) , has some leg swelling  She took lasix yesterday  Advised her to elevated her legs and consider compression hose.  She saw her primary care yesterday  Had labs drawn yesterday  Has some mild chest pain on occasion.     Sept. 7, 2021:  Kayla [name] is seen today for follow up of her MVP and orthostatic hypotension. She has had some leg edema - we discussed leg elevation and compression hose at her last visit She has continued to have ankle  swelling  Better with the lasix and potassium  Seems to accumulate through the day  Has hx of superficial phlebitis  Still limited  by peripheral neuropathy.  Wt is 103 lbs ( down 5 lbs from last visit )    Feb. 15, 2023 Kayla [name] is seen today for MVP, orthostatic hypotension   Wt is 99 lbs ( down 4 lbs from last visit )  Lots of issues with peripheral neuropathy   Has a spina nerve stimulator.  Is not working as well as she would have hoped.   March 12, 2022: Kayla [name] is seen today for follow-up of her mitral valve prolapse and orthostatic hypotension.  Her weight today is 127 pounds Is on TPN now Is feeling better  She needs to have more GI surgery to correct adhesions and to re-route the rou-en-Y bypass. She needs cardiac clearance for this abdomina surgery  Has been having some chest pain  - thinks its due to stress Has had many normal stress myoviews over the years ( most recent was July 2022 )    April 22, 2023: Kayla [name] is seen today for follow-up of her orthostatic hypotension.  Mitral valve prolapse   Current Outpatient Medications on File Prior to Visit  Medication Sig Dispense Refill   aspirin-acetaminophen-caffeine (EXCEDRIN MIGRAINE) 250-250-65 MG tablet Take 1 tablet by mouth in the morning, at noon, in the evening, and at bedtime.     Baclofen 5 MG TABS Take 1 tablet (5 mg total) by mouth 3 (three) times daily as needed. 30 tablet 5   Biotin 1000 MCG CHEW Chew 1,000 mcg by mouth daily.     celecoxib (CELEBREX) 200 MG capsule Take 200 mg by mouth daily.     COLLAGEN PO Take 1 each by mouth daily.     Continuous Blood Gluc Receiver (DEXCOM G7 RECEIVER) DEVI by Does not apply route.     cycloSPORINE (RESTASIS) 0.05 % ophthalmic emulsion Place 1 drop into both eyes 2 (two) times daily. 5.5 mL 5   diclofenac Sodium (VOLTAREN) 1 % GEL Apply 2 to 4 gram to the affected area(s) (painful joints) up to 4 times daily as needed. 600 g 1   dicyclomine (BENTYL) 10 MG capsule Take 1-2 tab 3 times daily AC as needed for spasms and cramping. 270 capsule 1   Docusate Sodium (DSS) 100 MG CAPS Take 100 mg by mouth daily.      DULoxetine (CYMBALTA) 30 MG capsule Take 1 capsule (30 mg total) by mouth 2 (two) times daily. 180 capsule 2   famotidine (PEPCID) 20 MG tablet Take 20 mg by mouth daily as needed for heartburn or indigestion.     fentaNYL (DURAGESIC) 25 MCG/HR Place 1 patch onto the skin every 3 (three) days.  Change every 3 days 10 patch 0   fentaNYL (DURAGESIC) 25 MCG/HR Place 1 patch onto the skin every 3 (three) days. 10 patch 0   fentaNYL (DURAGESIC) 25 MCG/HR Place 1 patch onto the skin every 3 (three) days. 10 patch 0   Galcanezumab-gnlm (EMGALITY) 120 MG/ML SOAJ Inject 120 mg into the skin every 28 (twenty-eight) days. 3 mL 3   glucagon 1 MG injection Inject 1 mg into the muscle once as needed for up to 1 dose. 1 each 12   HYDROcodone-acetaminophen (NORCO) 10-325 MG tablet Take 1/2 to 1 tablet four times daily, if needed for pain, max daily dose: 4 Tablet (Patient not taking: Reported on 03/11/2023) 120 tablet 0   HYDROcodone-acetaminophen (NORCO) 10-325 MG tablet Take 0.5-1 tablets by mouth 4 (four) times daily as needed for pain. max 4 tabs daily (Patient taking differently: Take 0.5 tablets by mouth every 3 (three) hours as needed for moderate pain (pain score 4-6).) 120 tablet 0   HYDROcodone-acetaminophen (NORCO) 10-325 MG tablet Take 0.5-1 tablets by mouth 4 (four) times daily as needed for pain. Max 4 tablets by mouth daily 120 tablet 0   HYDROcodone-acetaminophen (NORCO) 10-325 MG tablet Take 0.5-1 tablets by mouth 4 (four) times daily as needed. 120 tablet 0   hydrocortisone cream 1 % Apply 1 Application topically daily as needed for itching.     hypromellose (GENTEAL) 0.3 % GEL ophthalmic ointment Place 1 drop into both eyes at bedtime. Reported on 02/11/2015     latanoprost (XALATAN) 0.005 % ophthalmic solution Place 1 drop into both eyes at bedtime.     lidocaine (LIDODERM) 5 % Place 1 patch onto the skin as needed. Remove & Discard patch within 12 hours. May dispense as 3 month supply (Patient  taking differently: Place 1 patch onto the skin daily as needed (pain). Remove & Discard patch within 12 hours. May dispense as 3 month supply) 90 patch 3   magnesium hydroxide (MILK OF MAGNESIA) 400 MG/5ML suspension Take 5 mLs by mouth daily as needed for moderate constipation.     metroNIDAZOLE (METROGEL) 1 % gel Apply topically daily. 45 g 0   mirabegron ER (MYRBETRIQ) 25 MG TB24 tablet Take 1 tablet (25 mg total) by mouth daily. (Patient not taking: Reported on 03/11/2023) 90 tablet 1   naloxegol oxalate (MOVANTIK) 25 MG TABS tablet Take 25 mg by mouth daily.     ondansetron (ZOFRAN) 8 MG tablet Take 1 tablet (8 mg total) by mouth every 8 (eight) hours as needed for nausea or vomiting. 60 tablet 0   OVER THE COUNTER MEDICATION Take 1 Scoop by mouth daily. Copper, Selenium and Zinc Powder     phenazopyridine (PYRIDIUM) 95 MG tablet Take 95 mg by mouth 3 (three) times daily as needed for pain.     pregabalin (LYRICA) 50 MG capsule Take 1 capsule in morning and 2 capsules at night (Patient taking differently: Take 50 mg by mouth 2 (two) times daily.) 270 capsule 3   Probiotic Product (PROBIOTIC-10 PO) Take 1 capsule by mouth daily.     rizatriptan (MAXALT-MLT) 10 MG disintegrating tablet Take 1 tablet (10 mg total) by mouth as needed for migraine. May repeat in 2 hours if needed 27 tablet 3   solifenacin (VESICARE) 5 MG tablet Take 5 mg by mouth daily.     sucralfate (CARAFATE) 1 GM/10ML suspension Take 10 mLs (1 g total) by mouth 4 (four) times daily. (Patient taking differently: Take 1 g by mouth daily  as needed (acid reflux).) 420 mL 1   No current facility-administered medications on file prior to visit.    Allergies  Allergen Reactions   Ambien [Zolpidem Tartrate]     Hallucinations   Codeine Anaphylaxis   Oxycodone-Acetaminophen Shortness Of Breath   Tylox [Oxycodone-Acetaminophen] Anaphylaxis    Chest pain   Zolpidem Tartrate Anaphylaxis and Other (See Comments)    Other  Reaction(s): Hallucination  Other reaction(s): Other, hallucinations, Other reaction(s): Other, hallucinations, hallucinations   Celebrex [Celecoxib] Other (See Comments)    Stomach cramping   Darvocet [Propoxyphene N-Acetaminophen]     Chest pain   Darvon [Propoxyphene] Itching   Lorcet [Hydrocodone-Acetaminophen]     Chest pain   Opium     hallucinations   Vicodin [Hydrocodone-Acetaminophen] Other (See Comments)    Chest Pain    Wheat    Amoxicillin Nausea And Vomiting    Reaction:  Migraine headache   Penicillins Nausea And Vomiting    Migraine Headaches    Past Medical History:  Diagnosis Date   Adjustment disorder with depressed mood 03/18/2007   Allergy    Anal fissure 10/08/2016   Angina    takes Propanolol prn and it stops her chest   Anorexia nervosa 11/11/2019   Barrett esophagus    not consistently present   Cataract    removed  but had retinal detachment - repeat cataract right eye   Cervical facet joint syndrome 10/08/2016   Chronic constipation 10/04/2010   Chronic interstitial cystitis 10/16/2011   Chronic kidney disease    kidney stones and UTI   Chronic maxillary sinusitis 09/15/2013   Chronic narcotic dependence 10/03/2018   Complication of anesthesia    either  BP or pulse dropped with last surgery   Degeneration of cervical intervertebral disc 03/18/2007   Degeneration of lumbar or lumbosacral intervertebral disc 03/18/2007   Diaphragmatic hernia 03/18/2007   Dysrhythmia    brought on by stress   Epigastric pain 11/11/2019   Esophageal dysphagia 10/03/2018   External hemorrhoids    Fibromyalgia    neuropathy knees ankles and toes, bladder   Generalized anxiety disorder 03/18/2007   GERD (gastroesophageal reflux disease) 03/18/2007   HA (headache)-chronic/tension type 07/28/2012   Heart murmur    Hiatal hernia    History of gastric bypass 02/11/2014   1999 at [facility]   History of hyperparathyroidism 01/22/2016   History of kidney  stones    Hypertension    No longer on meds   Hypoglycemia 11/08/2016   IBS (irritable bowel syndrome)    Internal hemorrhoids    Kidney stones    multiple per patient   LLQ abdominal pain 11/11/2019   Macular degeneration    Malnutrition 11/11/2019   Mechanical breakdown implanted electronic neurostimulator spinal cord 11/16/2018   Mitral regurgitation 12/11/2015   MVP (mitral valve prolapse) 12/17/2017   Myalgia 03/18/2007   Nephrolithiasis 07/28/2012   lithotrip 2013-Eskridge,MD   Neuropathic pain 01/23/2018   Orthostatic hypotension 11/20/2016   Osteoarthritis    all over   Osteopetrosis    Osteoporosis 07/28/2012   Peripheral vascular disease    superficial phlebitis in left calf   Personal history of colonic polyps 07/22/2013   07/2013 - 3 small adenomas - repeat colonoscopy 2018   PTSD (post-traumatic stress disorder)    Reactive hypoglycemia    Rosacea 11/02/2017   Small intestinal bacterial overgrowth 08/05/2017   + lactulose breath test 06/2017 Xifaxan Rx   Type II diabetes mellitus 11/18/2016   Pt  states now diagnosed with reactive hypoglycemia   Urinary tract infection     Past Surgical History:  Procedure Laterality Date   ABDOMINAL HYSTERECTOMY     APPENDECTOMY     CARDIAC CATHETERIZATION  03/22/1991   EF 61%   CARDIOVASCULAR STRESS TEST  10/03/2006   CERVICAL SPINE SURGERY  02/14/2022   BLOCK   CHOLECYSTECTOMY     COLONOSCOPY  06/23/2008   normal   CYSTOSCOPY W/ URETERAL STENT REMOVAL Bilateral 03/31/2023   Procedure: CYSTOSCOPY WITH BILATERAL STENT REMOVAL;  Surgeon: Crista Elliot, MD;  Location: [facility];  Service: Urology;  Laterality: Bilateral;  30 MINUTE CASE   CYSTOSCOPY/URETEROSCOPY/HOLMIUM LASER/STENT PLACEMENT Bilateral 03/24/2023   Procedure: CYSTOSCOPY/BILATERAL URETEROSCOPY/HOLMIUM LASER/STENT PLACEMENT;  Surgeon: Crista Elliot, MD;  Location: [facility];  Service: Urology;  Laterality: Bilateral;  90 MINUTE CASE   DILATION AND  CURETTAGE OF UTERUS     x2   ESOPHAGUS SURGERY     EXPLORATORY LAPAROTOMY  1993   EYE SURGERY     GASTRIC BYPASS  1999   GASTRIC RESTRICTION SURGERY  1991   LASIK Bilateral    Right Arm Surgery     Right Knee Arthroscopy     Rt wrist fx  2009   SPINAL CORD STIMULATOR IMPLANT     spinal surgery battery implant     stomach stappeling  1991   STONE EXTRACTION WITH BASKET  2012   TONSILLECTOMY     TUBAL LIGATION     UPPER GASTROINTESTINAL ENDOSCOPY  06/23/2008   w/Dil, Barrett's esophagus   US ECHOCARDIOGRAPHY  01/21/2007   EF 55-60%   US ECHOCARDIOGRAPHY  08/30/2003   EF 55-60%    Social History   Tobacco Use  Smoking Status Never   Passive exposure: Past  Smokeless Tobacco Never    Social History   Substance and Sexual Activity  Alcohol Use No    Family History  Problem Relation Age of Onset   Stroke Mother    Lung cancer Mother    Heart disease Mother    Diabetes Mother    Hypertension Father    Stroke Father    Heart disease Father    Kidney disease Father    Seizures Father        epilepsy, and sisters x 2   Memory loss Father    Lung cancer Sister    Breast cancer Sister    Cancer - Lung Sister    Heart disease Sister    Diabetes Sister    Pancreatic cancer Sister    Healthy Daughter    Healthy Son    Colon cancer Maternal Aunt        12 relatives   Colon cancer Paternal Aunt    Colon cancer Paternal Aunt    Uterine cancer Other        aunt   Heart disease Other        grandmother   Esophageal cancer Neg Hx    Rectal cancer Neg Hx    Stomach cancer Neg Hx    Colon polyps Neg Hx     Reviw of Systems:  Reviewed in the HPI.  All other systems are negative.    Physical Exam: There were no vitals taken for this visit.  No BP recorded.  {Refresh Note OR Click here to enter BP  :1}***    GEN:  Well nourished, well developed in no acute distress HEENT: Normal NECK: No JVD; No carotid bruits LYMPHATICS: No  lymphadenopathy CARDIAC: RRR  ***, no murmurs, rubs, gallops RESPIRATORY:  Clear to auscultation without rales, wheezing or rhonchi  ABDOMEN: Soft, non-tender, non-distended MUSCULOSKELETAL:  No edema; No deformity  SKIN: Warm and dry NEUROLOGIC:  Alert and oriented x 3    ECG:            Assessment / Plan:   1.  Orthostatic hypotension:        2.  Chest pain:       3.    Leg swelling :       4.  MVP :          Kristeen Miss, MD  04/21/2023 5:12 PM    [facility] [address] [place],  [address] [place], [place]  [address] Pager [phone] Phone: [phone]; Fax: [phone]

## 2023-04-22 ENCOUNTER — Ambulatory Visit: Attending: Cardiovascular Disease | Admitting: Cardiovascular Disease

## 2023-04-22 ENCOUNTER — Encounter: Payer: Self-pay | Admitting: Cardiovascular Disease

## 2023-04-22 VITALS — BP 110/68 | HR 97 | Ht 65.0 in | Wt 109.2 lb

## 2023-04-22 DIAGNOSIS — I951 Orthostatic hypotension: Secondary | ICD-10-CM | POA: Diagnosis not present

## 2023-04-22 DIAGNOSIS — I341 Nonrheumatic mitral (valve) prolapse: Secondary | ICD-10-CM | POA: Diagnosis not present

## 2023-04-22 NOTE — Patient Instructions (Signed)
 Medication Instructions:  Your physician recommends that you continue on your current medications as directed. Please refer to the Current Medication list given to you today.  *If you need a refill on your cardiac medications before your next appointment, please call your pharmacy*  Lab Work: None ordered today. If you have labs (blood work) drawn today and your tests are completely normal, you will receive your results only by: MyChart Message (if you have MyChart) OR A paper copy in the mail If you have any lab test that is abnormal or we need to change your treatment, we will call you to review the results.  Testing/Procedures: None ordered today.  Follow-Up: At [facility], you and your health needs are our priority.  As part of our continuing mission to provide you with exceptional heart care, we have created designated Provider Care Teams.  These Care Teams include your primary Cardiologist (physician) and Advanced Practice Providers (APPs -  Physician Assistants and Nurse Practitioners) who all work together to provide you with the care you need, when you need it.  We recommend signing up for the patient portal called "MyChart".  Sign up information is provided on this After Visit Summary.  MyChart is used to connect with patients for Virtual Visits (Telemedicine).  Patients are able to view lab/test results, encounter notes, upcoming appointments, etc.  Non-urgent messages can be sent to your provider as well.   To learn more about what you can do with MyChart, go to [email or web address].    Your next appointment:   1 year(s)  The format for your next appointment:   In Person  Provider:   Available Provider  {  Other Instructions   1st Floor: - Lobby - Registration  - Pharmacy  - Lab - Cafe  2nd Floor: - PV Lab - Diagnostic Testing (echo, CT, nuclear med)  3rd Floor: - Vacant  4th Floor: - TCTS (cardiothoracic surgery) - AFib Clinic - Structural Heart  Clinic - Vascular Surgery  - Vascular Ultrasound  5th Floor: - [facility]Care Cardiology (general and EP) - Clinical Pharmacy for coumadin, hypertension, lipid, weight-loss medications, and med management appointments    Valet parking services will be available as well.

## 2023-04-23 ENCOUNTER — Telehealth: Payer: Self-pay | Admitting: Hematology and Oncology

## 2023-04-24 ENCOUNTER — Encounter: Payer: Self-pay | Admitting: Pharmacist

## 2023-04-24 ENCOUNTER — Other Ambulatory Visit: Payer: Self-pay | Admitting: Pharmacist

## 2023-04-24 DIAGNOSIS — M810 Age-related osteoporosis without current pathological fracture: Secondary | ICD-10-CM

## 2023-04-24 NOTE — Progress Notes (Signed)
 Next Prolia SQ due on 04/06/2023. Diagnosis: age-related osteoporosis  Dose: 60 mg SQ every 6 months  Last Clinic Visit: 03/04/23 Next Clinic Visit: 09/02/23  Last Prolia dose: 10/08/22  Labs: BMP on 03/31/2023; Vitamin D on 03/20/23 wnl Last DEXA: 01/11/2020 Next DEXA is scheduled for 12/22/2023   Orders placed for Prolia x 1 dose. No premedicatons required.   Called patient and provided with phone number for: [facility] [phone]) Ginette Otto  Will follow-up to ensured scheduled and completed  Chesley Mires, PharmD, MPH, BCPS, CPP Clinical Pharmacist (Rheumatology and Pulmonology)

## 2023-05-07 ENCOUNTER — Other Ambulatory Visit: Payer: Self-pay | Admitting: Nurse Practitioner

## 2023-05-07 DIAGNOSIS — M79629 Pain in unspecified upper arm: Secondary | ICD-10-CM

## 2023-05-08 ENCOUNTER — Telehealth: Payer: Self-pay | Admitting: Neurology

## 2023-05-08 NOTE — Telephone Encounter (Signed)
 Called patient and informed her that she will need to contact her PCP to have them proceed with trying to help her with the wheelchair. She can have PCP request records from Dr. Festus Hubert and her pain management to help support her case.   Patient also informed me that she has a power wheelchair from 3 years ago that wont even turn on. I informed patient that insurance will only pay for a wheelchair every 5 years and that might be the denial cause.   I also informed patient about the [employer] where she might can find a wheelchair at a discounted price. Patient took the information down to contact.   Patient verbalized understanding of all explained and had no further questions or concerns.

## 2023-05-08 NOTE — Telephone Encounter (Signed)
 Left message with the after hour service on 05-07-23 at 5:10 pm    Caller need to speak with someone about getting a self pushing wheelchair  the ins denied due not enough information that the PCP sent in

## 2023-05-12 ENCOUNTER — Other Ambulatory Visit (HOSPITAL_BASED_OUTPATIENT_CLINIC_OR_DEPARTMENT_OTHER): Payer: Self-pay

## 2023-05-12 MED ORDER — HYDROCODONE-ACETAMINOPHEN 10-325 MG PO TABS
0.5000 | ORAL_TABLET | Freq: Four times a day (QID) | ORAL | 0 refills | Status: DC | PRN
Start: 2023-05-12 — End: 2023-06-10
  Filled 2023-05-19: qty 120, 30d supply, fill #0

## 2023-05-12 MED ORDER — FENTANYL 25 MCG/HR TD PT72
1.0000 | MEDICATED_PATCH | TRANSDERMAL | 0 refills | Status: DC
  Filled 2023-05-19: qty 10, 30d supply, fill #0

## 2023-05-12 NOTE — Progress Notes (Signed)
 Prolia  scheduled for 05/21/23

## 2023-05-15 ENCOUNTER — Inpatient Hospital Stay: Attending: Hematology and Oncology

## 2023-05-15 ENCOUNTER — Inpatient Hospital Stay: Admitting: Hematology and Oncology

## 2023-05-15 VITALS — BP 112/76 | HR 96 | Temp 98.0°F | Resp 16 | Wt 99.3 lb

## 2023-05-15 DIAGNOSIS — K912 Postsurgical malabsorption, not elsewhere classified: Secondary | ICD-10-CM | POA: Insufficient documentation

## 2023-05-15 DIAGNOSIS — E538 Deficiency of other specified B group vitamins: Secondary | ICD-10-CM | POA: Diagnosis present

## 2023-05-15 DIAGNOSIS — D508 Other iron deficiency anemias: Secondary | ICD-10-CM

## 2023-05-15 DIAGNOSIS — D509 Iron deficiency anemia, unspecified: Secondary | ICD-10-CM | POA: Insufficient documentation

## 2023-05-15 DIAGNOSIS — Z9884 Bariatric surgery status: Secondary | ICD-10-CM | POA: Diagnosis not present

## 2023-05-15 LAB — CBC WITH DIFFERENTIAL (CANCER CENTER ONLY)
Abs Immature Granulocytes: 0.01 10*3/uL (ref 0.00–0.07)
Basophils Absolute: 0.1 10*3/uL (ref 0.0–0.1)
Basophils Relative: 1 %
Eosinophils Absolute: 0.4 10*3/uL (ref 0.0–0.5)
Eosinophils Relative: 5 %
HCT: 40.3 % (ref 36.0–46.0)
Hemoglobin: 13.3 g/dL (ref 12.0–15.0)
Immature Granulocytes: 0 %
Lymphocytes Relative: 33 %
Lymphs Abs: 2.3 10*3/uL (ref 0.7–4.0)
MCH: 31 pg (ref 26.0–34.0)
MCHC: 33 g/dL (ref 30.0–36.0)
MCV: 93.9 fL (ref 80.0–100.0)
Monocytes Absolute: 0.4 10*3/uL (ref 0.1–1.0)
Monocytes Relative: 6 %
Neutro Abs: 3.8 10*3/uL (ref 1.7–7.7)
Neutrophils Relative %: 55 %
Platelet Count: 339 10*3/uL (ref 150–400)
RBC: 4.29 MIL/uL (ref 3.87–5.11)
RDW: 13.3 % (ref 11.5–15.5)
WBC Count: 6.9 10*3/uL (ref 4.0–10.5)
nRBC: 0 % (ref 0.0–0.2)

## 2023-05-15 LAB — IRON AND IRON BINDING CAPACITY (CC-WL,HP ONLY)
Iron: 75 ug/dL (ref 28–170)
Saturation Ratios: 21 % (ref 10.4–31.8)
TIBC: 351 ug/dL (ref 250–450)
UIBC: 276 ug/dL (ref 148–442)

## 2023-05-15 LAB — CMP (CANCER CENTER ONLY)
ALT: 65 U/L — ABNORMAL HIGH (ref 0–44)
AST: 65 U/L — ABNORMAL HIGH (ref 15–41)
Albumin: 4.2 g/dL (ref 3.5–5.0)
Alkaline Phosphatase: 96 U/L (ref 38–126)
Anion gap: 6 (ref 5–15)
BUN: 26 mg/dL — ABNORMAL HIGH (ref 8–23)
CO2: 31 mmol/L (ref 22–32)
Calcium: 9.6 mg/dL (ref 8.9–10.3)
Chloride: 103 mmol/L (ref 98–111)
Creatinine: 0.91 mg/dL (ref 0.44–1.00)
GFR, Estimated: >60 mL/min (ref >60)
Glucose, Bld: 94 mg/dL (ref 70–99)
Potassium: 4.2 mmol/L (ref 3.5–5.1)
Sodium: 140 mmol/L (ref 135–145)
Total Bilirubin: 0.4 mg/dL (ref 0.0–1.2)
Total Protein: 6.9 g/dL (ref 6.5–8.1)

## 2023-05-15 LAB — FERRITIN: Ferritin: 69 ng/mL (ref 11–307)

## 2023-05-15 LAB — FOLATE: Folate: >40 ng/mL (ref >5.9)

## 2023-05-15 LAB — RETIC PANEL
Immature Retic Fract: 7.4 % (ref 2.3–15.9)
RBC.: 4.22 MIL/uL (ref 3.87–5.11)
Retic Count, Absolute: 84.4 10*3/uL (ref 19.0–186.0)
Retic Ct Pct: 2 % (ref 0.4–3.1)
Reticulocyte Hemoglobin: 35.8 pg (ref >27.9)

## 2023-05-15 LAB — VITAMIN B12: Vitamin B-12: 742 pg/mL (ref 180–914)

## 2023-05-15 NOTE — Progress Notes (Signed)
 [facility] [phone]) [phone]   [phone]  PROGRESS NOTE  Patient Care Team: Aleta Anda, MD as PCP - General (Family Medicine) Nahser, Lela Purple, MD as PCP - Cardiology (Cardiology) Rainwater, Seabron Cypress, PA-C as Physician Assistant (Physician Assistant) Willye Harvey, Ponce Brisker., MD as Referring Physician (Urology) Corie Diamond, MD as Consulting Physician (Ophthalmology) Kenney Peacemaker, MD as Consulting Physician (Gastroenterology) Merriam Abbey, DO as Consulting Physician (Neurology) Nicholas Bari, MD as Consulting Physician (Rheumatology) Jerrol Morelle, PsyD (Inactive) (Psychiatry) Gerre Kraft, DMD (Dentistry) Suzi Essex, DC as Referring Physician (Chiropractic Medicine) Dorothe Gaster, RD as Dietitian (Family Medicine) Dorothe Gaster, RD as Dietitian (Family Medicine) Aleta Anda, MD as Attending Physician (Family Medicine)  Hematological/Oncological History # Iron Deficiency Anemia 2/2 to Gastric Bypass 12/04/2020: WBC 6.6, Hgb 11.0, MCV 86.9, Plt 293 03/26/2021: WBC 5.9, Hgb 10.3, MCV 86.3, Plt 306, ferritin 8.2 04/18/2021: establish care with Dr. Rosaline Coma  04/19/2021: Received 1 dose of IV Monoferric  1000 mg 05/30/2021: WBC 7.5, Hgb 12.7, MCV 89.6, Plt 264  Interval History:  Kayla [name] 78 y.o. female with medical history significant for iron deficiency anemia secondary to gastric bypass who presents for a follow up visit. The patient's last visit was on 12/05/2022. In the interim since the last visit she   On exam today Kayla [name] reports she has lost all the weight that she was able to gain back with TPN.  Her weight is down to 99 pounds.  She reports that she has been talking with the surgeons at [facility] who will do a gastric revision and have a feeding tube placed.  The patient is hesitant to do this.  She is doing her best to try to take water-soluble vitamins in order to keep her nutritional levels up.  She reports that  she has "no energy".  She notes that she also struggles with her peripheral neuropathy.  She notes that she quite often gets sick when trying to eat and unfortunately has to force himself to throw up what gets stuck in her esophagus.  She reports that she is having some lightheadedness but no dizziness or shortness of breath.  She was recently treated with antibiotics for a urinary tract infection.  This was due to kidney stones requiring bilateral lithotripsy and subsequent stents which then resulted in the UTI.  She notes that she has not been having any trouble with bleeding, bruising, or dark stools.  She otherwise denies any fevers, chills, sweats.  A full 10 point ROS is otherwise negative.  MEDICAL HISTORY:  Past Medical History:  Diagnosis Date   Adjustment disorder with depressed mood 03/18/2007   Allergy    Anal fissure 10/08/2016   Angina    takes Propanolol prn and it stops her chest   Anorexia nervosa 11/11/2019   Barrett esophagus    not consistently present   Cataract    removed  but had retinal detachment - repeat cataract right eye   Cervical facet joint syndrome 10/08/2016   Chronic constipation 10/04/2010   Chronic interstitial cystitis 10/16/2011   Chronic kidney disease    kidney stones and UTI   Chronic maxillary sinusitis 09/15/2013   Chronic narcotic dependence 10/03/2018   Complication of anesthesia    either  BP or pulse dropped with last surgery   Degeneration of cervical intervertebral disc 03/18/2007   Degeneration of lumbar or lumbosacral intervertebral disc 03/18/2007   Diaphragmatic hernia 03/18/2007   Dysrhythmia  brought on by stress   Epigastric pain 11/11/2019   Esophageal dysphagia 10/03/2018   External hemorrhoids    Fibromyalgia    neuropathy knees ankles and toes, bladder   Generalized anxiety disorder 03/18/2007   GERD (gastroesophageal reflux disease) 03/18/2007   HA (headache)-chronic/tension type 07/28/2012   Heart murmur    Hiatal  hernia    History of gastric bypass 02/11/2014   1999 at [facility]   History of hyperparathyroidism 01/22/2016   History of kidney stones    Hypertension    No longer on meds   Hypoglycemia 11/08/2016   IBS (irritable bowel syndrome)    Internal hemorrhoids    Kidney stones    multiple per patient   LLQ abdominal pain 11/11/2019   Macular degeneration    Malnutrition 11/11/2019   Mechanical breakdown implanted electronic neurostimulator spinal cord 11/16/2018   Mitral regurgitation 12/11/2015   MVP (mitral valve prolapse) 12/17/2017   Myalgia 03/18/2007   Nephrolithiasis 07/28/2012   lithotrip 2013-Eskridge,MD   Neuropathic pain 01/23/2018   Orthostatic hypotension 11/20/2016   Osteoarthritis    all over   Osteopetrosis    Osteoporosis 07/28/2012   Peripheral vascular disease    superficial phlebitis in left calf   Personal history of colonic polyps 07/22/2013   07/2013 - 3 small adenomas - repeat colonoscopy 2018   PTSD (post-traumatic stress disorder)    Reactive hypoglycemia    Rosacea 11/02/2017   Small intestinal bacterial overgrowth 08/05/2017   + lactulose breath test 06/2017 Xifaxan  Rx   Type II diabetes mellitus 11/18/2016   Pt states now diagnosed with reactive hypoglycemia   Urinary tract infection     SURGICAL HISTORY: Past Surgical History:  Procedure Laterality Date   ABDOMINAL HYSTERECTOMY     APPENDECTOMY     CARDIAC CATHETERIZATION  03/22/1991   EF 61%   CARDIOVASCULAR STRESS TEST  10/03/2006   CERVICAL SPINE SURGERY  02/14/2022   BLOCK   CHOLECYSTECTOMY     COLONOSCOPY  06/23/2008   normal   CYSTOSCOPY W/ URETERAL STENT REMOVAL Bilateral 03/31/2023   Procedure: CYSTOSCOPY WITH BILATERAL STENT REMOVAL;  Surgeon: Samson Croak, MD;  Location: [facility];  Service: Urology;  Laterality: Bilateral;  30 MINUTE CASE   CYSTOSCOPY/URETEROSCOPY/HOLMIUM LASER/STENT PLACEMENT Bilateral 03/24/2023   Procedure: CYSTOSCOPY/BILATERAL URETEROSCOPY/HOLMIUM  LASER/STENT PLACEMENT;  Surgeon: Samson Croak, MD;  Location: [facility];  Service: Urology;  Laterality: Bilateral;  90 MINUTE CASE   DILATION AND CURETTAGE OF UTERUS     x2   ESOPHAGUS SURGERY     EXPLORATORY LAPAROTOMY  1993   EYE SURGERY     GASTRIC BYPASS  1999   GASTRIC RESTRICTION SURGERY  1991   LASIK Bilateral    Right Arm Surgery     Right Knee Arthroscopy     Rt wrist fx  2009   SPINAL CORD STIMULATOR IMPLANT     spinal surgery battery implant     stomach stappeling  1991   STONE EXTRACTION WITH BASKET  2012   TONSILLECTOMY     TUBAL LIGATION     UPPER GASTROINTESTINAL ENDOSCOPY  06/23/2008   w/Dil, Barrett's esophagus   US  ECHOCARDIOGRAPHY  01/21/2007   EF 55-60%   US  ECHOCARDIOGRAPHY  08/30/2003   EF 55-60%    SOCIAL HISTORY: Social History   Socioeconomic History   Marital status: Married    Spouse name: Nori Beat.   Number of children: 2   Years of education: 87  Highest education level: Some college, no degree  Occupational History   Occupation: Disability    Employer: RETIRED  Tobacco Use   Smoking status: Never    Passive exposure: Past   Smokeless tobacco: Never  Vaping Use   Vaping status: Never Used  Substance and Sexual Activity   Alcohol  use: No   Drug use: No   Sexual activity: Not Currently  Other Topics Concern   Not on file  Social History Narrative   Right handed   Two story home   She retired on disability   Married   2 Children   Social Drivers of Health   Financial Resource Strain: Medium Risk (03/25/2023)   Received from [facility]   Overall Financial Resource Strain (CARDIA)    Difficulty of Paying Living Expenses: Somewhat hard  Food Insecurity: No Food Insecurity (03/25/2023)   Received from [facility]   Hunger Vital Sign    Worried About Running Out of Food in the Last Year: Never true    Ran Out of Food in the Last Year: Never true  Transportation Needs: No Transportation  Needs (03/25/2023)   Received from [facility] - Transportation    In the past 12 months, has lack of transportation kept you from medical appointments or from getting medications?: No    Lack of Transportation (Non-Medical): No  Physical Activity: Inactive (11/22/2022)   Received from [facility]   Exercise Vital Sign    Days of Exercise per Week: 0 days    Minutes of Exercise per Session: 0 min  Stress: Stress Concern Present (11/22/2022)   Received from [facility] - Occupational Stress Questionnaire    Feeling of Stress : Very much  Social Connections: Moderately Integrated (11/22/2022)   Received from [facility]   Social Connection and Isolation Panel [NHANES]    Frequency of Communication with Friends and Family: More than three times a week    Frequency of Social Gatherings with Friends and Family: Once a week    Attends Religious Services: More than 4 times per year    Active Member of [employer] or Organizations: No    Attends Banker Meetings: Never    Marital Status: Married  Catering manager Violence: Unknown (08/25/2022)   Received from [facility]   HITS    Physically Hurt: Not on file    Insult or Talk Down To: Not on file    Threaten Physical Harm: Not on file    Scream or Curse: Not on file    FAMILY HISTORY: Family History  Problem Relation Age of Onset   Stroke Mother    Lung cancer Mother    Heart disease Mother    Diabetes Mother    Hypertension Father    Stroke Father    Heart disease Father    Kidney disease Father    Seizures Father        epilepsy, and sisters x 2   Memory loss Father    Lung cancer Sister    Breast cancer Sister    Cancer - Lung Sister    Heart disease Sister    Diabetes Sister    Pancreatic cancer Sister    Healthy Daughter    Healthy Son    Colon cancer Maternal Aunt        12 relatives    Colon cancer Paternal  Aunt    Colon cancer Paternal Aunt    Uterine cancer Other        aunt   Heart disease Other        grandmother   Esophageal cancer Neg Hx    Rectal cancer Neg Hx    Stomach cancer Neg Hx    Colon polyps Neg Hx     ALLERGIES:  is allergic to ambien [zolpidem tartrate], codeine, oxycodone-acetaminophen , tylox [oxycodone-acetaminophen ], zolpidem tartrate, celebrex  [celecoxib ], darvocet [propoxyphene n-acetaminophen ], darvon [propoxyphene], lorcet [hydrocodone -acetaminophen ], opium, vicodin [hydrocodone -acetaminophen ], wheat, amoxicillin , and penicillins.  MEDICATIONS:  Current Outpatient Medications  Medication Sig Dispense Refill   aspirin-acetaminophen -caffeine  (EXCEDRIN MIGRAINE) 250-250-65 MG tablet Take 1 tablet by mouth in the morning, at noon, in the evening, and at bedtime.     Baclofen  5 MG TABS Take 1 tablet (5 mg total) by mouth 3 (three) times daily as needed. 30 tablet 5   Biotin 1000 MCG CHEW Chew 1,000 mcg by mouth daily.     COLLAGEN PO Take 1 each by mouth daily.     Continuous Blood Gluc Receiver (DEXCOM G7 RECEIVER) DEVI by Does not apply route.     cycloSPORINE  (RESTASIS ) 0.05 % ophthalmic emulsion Place 1 drop into both eyes 2 (two) times daily. 5.5 mL 5   diclofenac  Sodium (VOLTAREN ) 1 % GEL Apply 2 to 4 gram to the affected area(s) (painful joints) up to 4 times daily as needed. 600 g 1   dicyclomine  (BENTYL ) 10 MG capsule Take 1-2 tab 3 times daily AC as needed for spasms and cramping. 270 capsule 1   Docusate Sodium  (DSS) 100 MG CAPS Take 100 mg by mouth daily.     DULoxetine  (CYMBALTA ) 30 MG capsule Take 1 capsule (30 mg total) by mouth 2 (two) times daily. 180 capsule 2   famotidine  (PEPCID ) 20 MG tablet Take 20 mg by mouth daily as needed for heartburn or indigestion.     fentaNYL  (DURAGESIC ) 25 MCG/HR Apply 1 (one) patch to the skin every 72 hours. (Dispose of used patch properly, change every 3 days) 10 patch 0   Galcanezumab -gnlm  (EMGALITY ) 120 MG/ML SOAJ Inject 120 mg into the skin every 28 (twenty-eight) days. 3 mL 3   glucagon  1 MG injection Inject 1 mg into the muscle once as needed for up to 1 dose. 1 each 12   HYDROcodone -acetaminophen  (NORCO) 10-325 MG tablet Take 1/2 to 1 tablet four times daily, if needed for pain, max daily dose: 4 Tablet 120 tablet 0   hydrocortisone cream 1 % Apply 1 Application topically daily as needed for itching.     hypromellose (GENTEAL) 0.3 % GEL ophthalmic ointment Place 1 drop into both eyes at bedtime. Reported on 02/11/2015     latanoprost (XALATAN) 0.005 % ophthalmic solution Place 1 drop into both eyes at bedtime.     lidocaine  (LIDODERM ) 5 % Place 1 patch onto the skin as needed. Remove & Discard patch within 12 hours. May dispense as 3 month supply (Patient taking differently: Place 1 patch onto the skin daily as needed (pain). Remove & Discard patch within 12 hours. May dispense as 3 month supply) 90 patch 3   magnesium  hydroxide (MILK OF MAGNESIA) 400 MG/5ML suspension Take 5 mLs by mouth daily as needed for moderate constipation.     metroNIDAZOLE  (METROGEL ) 1 % gel Apply topically daily. 45 g 0   mirabegron  ER (MYRBETRIQ ) 25 MG TB24 tablet Take 1 tablet (25 mg total) by mouth daily. 90 tablet 1  naloxegol  oxalate (MOVANTIK ) 25 MG TABS tablet Take 25 mg by mouth daily.     ondansetron  (ZOFRAN ) 8 MG tablet Take 1 tablet (8 mg total) by mouth every 8 (eight) hours as needed for nausea or vomiting. 60 tablet 0   OVER THE COUNTER MEDICATION Take 1 Scoop by mouth daily. Copper , Selenium and Zinc  Powder     pregabalin  (LYRICA ) 50 MG capsule Take 1 capsule in morning and 2 capsules at night (Patient taking differently: Take 50 mg by mouth 2 (two) times daily.) 270 capsule 3   Probiotic Product (PROBIOTIC-10 PO) Take 1 capsule by mouth daily.     rizatriptan  (MAXALT -MLT) 10 MG disintegrating tablet Take 1 tablet (10 mg total) by mouth as needed for migraine. May repeat in 2 hours if  needed 27 tablet 3   solifenacin  (VESICARE ) 5 MG tablet Take 5 mg by mouth daily.     sucralfate  (CARAFATE ) 1 GM/10ML suspension Take 10 mLs (1 g total) by mouth 4 (four) times daily. (Patient taking differently: Take 1 g by mouth daily as needed (acid reflux).) 420 mL 1   No current facility-administered medications for this visit.    REVIEW OF SYSTEMS:   Constitutional: ( - ) fevers, ( - )  chills , ( - ) night sweats Eyes: ( - ) blurriness of vision, ( - ) double vision, ( - ) watery eyes Ears, nose, mouth, throat, and face: ( - ) mucositis, ( - ) sore throat Respiratory: ( - ) cough, ( - ) dyspnea, ( - ) wheezes Cardiovascular: ( - ) palpitation, ( - ) chest discomfort, ( - ) lower extremity swelling Gastrointestinal:  ( - ) nausea, ( - ) heartburn, ( - ) change in bowel habits Skin: ( - ) abnormal skin rashes Lymphatics: ( - ) new lymphadenopathy, ( - ) easy bruising Neurological: ( - ) numbness, ( - ) tingling, ( - ) new weaknesses Behavioral/Psych: ( - ) mood change, ( - ) new changes  All other systems were reviewed with the patient and are negative.  PHYSICAL EXAMINATION:  Vitals:   05/15/23 1058  BP: 112/76  Pulse: 96  Resp: 16  Temp: 98 F (36.7 C)  SpO2: 99%     Filed Weights   05/15/23 1058  Weight: 99 lb 4.8 oz (45 kg)      GENERAL: Cachectic appearing elderly Caucasian female, no distress and comfortable SKIN: skin color, texture, turgor are normal, no rashes or significant lesions EYES: conjunctiva are pink and non-injected, sclera clear LUNGS: clear to auscultation and percussion with normal breathing effort HEART: regular rate & rhythm and no murmurs and no lower extremity edema Musculoskeletal: no cyanosis of digits and no clubbing  PSYCH: alert & oriented x 3, fluent speech NEURO: no focal motor/sensory deficits  LABORATORY DATA:  I have reviewed the data as listed    Latest Ref Rng & Units 05/15/2023   10:16 AM 03/18/2023   10:47 AM 12/05/2022    10:44 AM  CBC  WBC 4.0 - 10.5 K/uL 6.9  7.2  7.1   Hemoglobin 12.0 - 15.0 g/dL 81.1  91.4  78.2   Hematocrit 36.0 - 46.0 % 40.3  38.6  40.3   Platelets 150 - 400 K/uL 339  231  281        Latest Ref Rng & Units 05/15/2023   10:16 AM 03/31/2023    7:30 AM 03/18/2023   10:47 AM  CMP  Glucose 70 - 99 mg/dL  94  100  92   BUN 8 - 23 mg/dL 26  38  25   Creatinine 0.44 - 1.00 mg/dL 2.84  1.32  4.40   Sodium 135 - 145 mmol/L 140  141  138   Potassium 3.5 - 5.1 mmol/L 4.2  4.4  4.7   Chloride 98 - 111 mmol/L 103  108  106   CO2 22 - 32 mmol/L 31  25  26    Calcium 8.9 - 10.3 mg/dL 9.6  8.6  8.9   Total Protein 6.5 - 8.1 g/dL 6.9     Total Bilirubin 0.0 - 1.2 mg/dL 0.4     Alkaline Phos 38 - 126 U/L 96     AST 15 - 41 U/L 65     ALT 0 - 44 U/L 65       Lab Results  Component Value Date   MPROTEIN Not Observed 04/03/2017    RADIOGRAPHIC STUDIES: No results found.  ASSESSMENT & PLAN Kayla [name] is a 78 y.o.  female with medical history significant for iron deficiency anemia secondary to gastric bypass who presents for a follow up visit.  # Iron Deficiency Anemia in the Setting of Gastric Bypass -- Patients with gastric bypass have difficulty absorbing iron which is a common cause of iron deficiency/ vitamin B12 deficiency in this population. --Last received 1 dose of IV monoferric  1000 mg on 04/19/2021 --Labs today show hemoglobin 13.3, white blood cell 6.9, MCV 93.9, platelets 339. --Recommend IV to help bolster levels if her iron were to drop again.  Iron and B12 studies pending from today. --return to clinic in 6 months time to reevaluate with interval lab visit in 3 months.  No orders of the defined types were placed in this encounter.   All questions were answered. The patient knows to call the clinic with any problems, questions or concerns.  A total of more than 30 minutes were spent on this encounter with face-to-face time and non-face-to-face time, including preparing to  see the patient, ordering tests and/or medications, counseling the patient and coordination of care as outlined above.   Rogerio Clay, MD Department of Hematology/Oncology [facility] Phone: [phone] Pager: [phone] Email: [email or web address]    05/15/2023 1:58 PM

## 2023-05-19 ENCOUNTER — Other Ambulatory Visit (HOSPITAL_BASED_OUTPATIENT_CLINIC_OR_DEPARTMENT_OTHER): Payer: Self-pay

## 2023-05-19 ENCOUNTER — Other Ambulatory Visit

## 2023-05-19 ENCOUNTER — Ambulatory Visit: Admitting: Hematology and Oncology

## 2023-05-19 LAB — METHYLMALONIC ACID, SERUM: Methylmalonic Acid, Quantitative: 1024 nmol/L — ABNORMAL HIGH (ref 0–378)

## 2023-05-21 ENCOUNTER — Ambulatory Visit (HOSPITAL_COMMUNITY)
Admission: RE | Admit: 2023-05-21 | Discharge: 2023-05-21 | Disposition: A | Discharge status: Home / Self Care | Source: Ambulatory Visit | Attending: Rheumatology | Admitting: Rheumatology

## 2023-05-21 DIAGNOSIS — M810 Age-related osteoporosis without current pathological fracture: Secondary | ICD-10-CM | POA: Insufficient documentation

## 2023-05-21 MED ORDER — DENOSUMAB 60 MG/ML ~~LOC~~ SOSY
60.0000 mg | PREFILLED_SYRINGE | Freq: Once | SUBCUTANEOUS | Status: AC
  Administered 2023-05-21: 60 mg via SUBCUTANEOUS

## 2023-05-21 MED ORDER — DENOSUMAB 60 MG/ML ~~LOC~~ SOSY
PREFILLED_SYRINGE | SUBCUTANEOUS | Status: AC
  Filled 2023-05-21: qty 1

## 2023-05-22 ENCOUNTER — Telehealth: Payer: Self-pay | Admitting: *Deleted

## 2023-05-22 NOTE — Telephone Encounter (Signed)
 TCT patient regarding recent lab results. Spoke with her. Advised that her iron levels and B12 were normal. No need for additional supplements at this time. Reviewed labs done elsewhere-Vitamin levels were all within normal limits. Pt voiced understanding.

## 2023-05-22 NOTE — Telephone Encounter (Signed)
-----   Message from Darilyn Edin sent at 05/21/2023  4:15 PM EDT ----- Iron and B12 levels are normal ----- Message ----- From: Dannis Dy, Lab In [place] Sent: 05/15/2023  10:41 AM EDT To: Ander Bame, MD

## 2023-06-10 ENCOUNTER — Other Ambulatory Visit (HOSPITAL_BASED_OUTPATIENT_CLINIC_OR_DEPARTMENT_OTHER): Payer: Self-pay

## 2023-06-10 MED ORDER — HYDROCODONE-ACETAMINOPHEN 10-325 MG PO TABS
0.5000 | ORAL_TABLET | Freq: Four times a day (QID) | ORAL | 0 refills | Status: DC | PRN
  Filled 2023-06-10 – 2023-06-16 (×2): qty 120, 30d supply, fill #0

## 2023-06-10 MED ORDER — FENTANYL 25 MCG/HR TD PT72
1.0000 | MEDICATED_PATCH | TRANSDERMAL | 0 refills | Status: DC
  Filled 2023-06-10 – 2023-06-16 (×2): qty 10, 30d supply, fill #0

## 2023-06-12 ENCOUNTER — Other Ambulatory Visit: Payer: Self-pay | Admitting: Nurse Practitioner

## 2023-06-12 ENCOUNTER — Other Ambulatory Visit: Payer: Self-pay | Admitting: Rheumatology

## 2023-06-12 DIAGNOSIS — N644 Mastodynia: Secondary | ICD-10-CM

## 2023-06-12 DIAGNOSIS — M79629 Pain in unspecified upper arm: Secondary | ICD-10-CM

## 2023-06-12 DIAGNOSIS — M810 Age-related osteoporosis without current pathological fracture: Secondary | ICD-10-CM

## 2023-06-13 ENCOUNTER — Other Ambulatory Visit (HOSPITAL_BASED_OUTPATIENT_CLINIC_OR_DEPARTMENT_OTHER): Payer: Self-pay

## 2023-06-13 ENCOUNTER — Encounter: Payer: Self-pay | Admitting: Nurse Practitioner

## 2023-06-13 DIAGNOSIS — M79629 Pain in unspecified upper arm: Secondary | ICD-10-CM

## 2023-06-13 DIAGNOSIS — N644 Mastodynia: Secondary | ICD-10-CM

## 2023-06-16 ENCOUNTER — Ambulatory Visit

## 2023-06-16 ENCOUNTER — Ambulatory Visit: Admission: RE | Admit: 2023-06-16 | Source: Ambulatory Visit

## 2023-06-16 ENCOUNTER — Other Ambulatory Visit (HOSPITAL_BASED_OUTPATIENT_CLINIC_OR_DEPARTMENT_OTHER): Payer: Self-pay

## 2023-06-16 ENCOUNTER — Ambulatory Visit
Admission: RE | Admit: 2023-06-16 | Discharge: 2023-06-16 | Disposition: A | Discharge status: Home / Self Care | Source: Ambulatory Visit | Attending: Nurse Practitioner | Admitting: Nurse Practitioner

## 2023-06-16 DIAGNOSIS — N644 Mastodynia: Secondary | ICD-10-CM

## 2023-06-16 DIAGNOSIS — M79629 Pain in unspecified upper arm: Secondary | ICD-10-CM

## 2023-06-18 ENCOUNTER — Ambulatory Visit: Admitting: Physical Therapy

## 2023-06-18 NOTE — Therapy (Incomplete)
 OUTPATIENT PHYSICAL THERAPY THORACOLUMBAR EVALUATION   Patient Name: Kayla [name] MRN: [id number] DOB:Jul 24, 1945, 78 y.o., female Today's Date: 06/18/2023  END OF SESSION:   Past Medical History:  Diagnosis Date   Adjustment disorder with depressed mood 03/18/2007   Allergy    Anal fissure 10/08/2016   Angina    takes Propanolol prn and it stops her chest   Anorexia nervosa 11/11/2019   Barrett esophagus    not consistently present   Cataract    removed  but had retinal detachment - repeat cataract right eye   Cervical facet joint syndrome 10/08/2016   Chronic constipation 10/04/2010   Chronic interstitial cystitis 10/16/2011   Chronic kidney disease    kidney stones and UTI   Chronic maxillary sinusitis 09/15/2013   Chronic narcotic dependence 10/03/2018   Complication of anesthesia    either  BP or pulse dropped with last surgery   Degeneration of cervical intervertebral disc 03/18/2007   Degeneration of lumbar or lumbosacral intervertebral disc 03/18/2007   Diaphragmatic hernia 03/18/2007   Dysrhythmia    brought on by stress   Epigastric pain 11/11/2019   Esophageal dysphagia 10/03/2018   External hemorrhoids    Fibromyalgia    neuropathy knees ankles and toes, bladder   Generalized anxiety disorder 03/18/2007   GERD (gastroesophageal reflux disease) 03/18/2007   HA (headache)-chronic/tension type 07/28/2012   Heart murmur    Hiatal hernia    History of gastric bypass 02/11/2014   1999 at [facility]   History of hyperparathyroidism 01/22/2016   History of kidney stones    Hypertension    No longer on meds   Hypoglycemia 11/08/2016   IBS (irritable bowel syndrome)    Internal hemorrhoids    Kidney stones    multiple per patient   LLQ abdominal pain 11/11/2019   Macular degeneration    Malnutrition 11/11/2019   Mechanical breakdown implanted electronic neurostimulator spinal cord 11/16/2018   Mitral regurgitation 12/11/2015   MVP (mitral valve  prolapse) 12/17/2017   Myalgia 03/18/2007   Nephrolithiasis 07/28/2012   lithotrip 2013-Eskridge,MD   Neuropathic pain 01/23/2018   Orthostatic hypotension 11/20/2016   Osteoarthritis    all over   Osteopetrosis    Osteoporosis 07/28/2012   Peripheral vascular disease    superficial phlebitis in left calf   Personal history of colonic polyps 07/22/2013   07/2013 - 3 small adenomas - repeat colonoscopy 2018   PTSD (post-traumatic stress disorder)    Reactive hypoglycemia    Rosacea 11/02/2017   Small intestinal bacterial overgrowth 08/05/2017   + lactulose breath test 06/2017 Xifaxan  Rx   Type II diabetes mellitus 11/18/2016   Pt states now diagnosed with reactive hypoglycemia   Urinary tract infection    Past Surgical History:  Procedure Laterality Date   ABDOMINAL HYSTERECTOMY     APPENDECTOMY     CARDIAC CATHETERIZATION  03/22/1991   EF 61%   CARDIOVASCULAR STRESS TEST  10/03/2006   CERVICAL SPINE SURGERY  02/14/2022   BLOCK   CHOLECYSTECTOMY     COLONOSCOPY  06/23/2008   normal   CYSTOSCOPY W/ URETERAL STENT REMOVAL Bilateral 03/31/2023   Procedure: CYSTOSCOPY WITH BILATERAL STENT REMOVAL;  Surgeon: Samson Croak, MD;  Location: [facility];  Service: Urology;  Laterality: Bilateral;  30 MINUTE CASE   CYSTOSCOPY/URETEROSCOPY/HOLMIUM LASER/STENT PLACEMENT Bilateral 03/24/2023   Procedure: CYSTOSCOPY/BILATERAL URETEROSCOPY/HOLMIUM LASER/STENT PLACEMENT;  Surgeon: Samson Croak, MD;  Location: [facility];  Service: Urology;  Laterality: Bilateral;  90 MINUTE  CASE   DILATION AND CURETTAGE OF UTERUS     x2   ESOPHAGUS SURGERY     EXPLORATORY LAPAROTOMY  1993   EYE SURGERY     GASTRIC BYPASS  1999   GASTRIC RESTRICTION SURGERY  1991   LASIK Bilateral    Right Arm Surgery     Right Knee Arthroscopy     Rt wrist fx  2009   SPINAL CORD STIMULATOR IMPLANT     spinal surgery battery implant     stomach stappeling  1991   STONE EXTRACTION WITH BASKET  2012   TONSILLECTOMY      TUBAL LIGATION     UPPER GASTROINTESTINAL ENDOSCOPY  06/23/2008   w/Dil, Barrett's esophagus   US  ECHOCARDIOGRAPHY  01/21/2007   EF 55-60%   US  ECHOCARDIOGRAPHY  08/30/2003   EF 55-60%   Patient Active Problem List   Diagnosis Date Noted   Recurrent UTI 07/13/2022   Iron deficiency anemia due to dietary causes 04/18/2021   Peripheral neuropathy 04/03/2021   Chronic kidney disease 03/22/2021   Macular degeneration 03/22/2021   PTSD (post-traumatic stress disorder)    Epigastric pain 11/11/2019   Anorexia nervosa 11/11/2019   LLQ abdominal pain 11/11/2019   Malnutrition 11/11/2019   Mechanical breakdown implanted electronic neurostimulator spinal cord 11/16/2018   Esophageal dysphagia 10/03/2018   Neuropathic pain 01/23/2018   MVP (mitral valve prolapse) 12/17/2017   Rosacea 11/02/2017   Small intestinal bacterial overgrowth 08/05/2017   Orthostatic hypotension 11/20/2016   Type II diabetes mellitus 11/18/2016   Hypoglycemia 11/08/2016   Cervical facet joint syndrome 10/08/2016   Anal fissure 10/08/2016   Peripheral vascular disease 01/22/2016   Hypertension 01/22/2016   History of hyperparathyroidism 01/22/2016   Mitral regurgitation 12/11/2015   History of gastric bypass 02/11/2014   Chronic maxillary sinusitis 09/15/2013   History of colonic polyps 07/22/2013   HA (headache)-chronic/tension type 07/28/2012   Osteoporosis 07/28/2012   Nephrolithiasis 07/28/2012   Fibromyalgia 07/26/2012   Chronic interstitial cystitis 10/16/2011   Chronic constipation 10/04/2010   Loss of weight 11/27/2009   Generalized anxiety disorder 03/18/2007   Adjustment disorder with depressed mood 03/18/2007   GERD (gastroesophageal reflux disease) 03/18/2007   Diaphragmatic hernia 03/18/2007   Osteoarthritis 03/18/2007   Degeneration of cervical intervertebral disc 03/18/2007   Degeneration of lumbar or lumbosacral intervertebral disc 03/18/2007   Myalgia 03/18/2007    PCP:  ***  REFERRING PROVIDER: Aleta Anda, MD  REFERRING DIAG: M54.5 low back pain  Rationale for Evaluation and Treatment: {HABREHAB:27488}  THERAPY DIAG:  No diagnosis found.  ONSET DATE: 05/21/2023 (referral date)  SUBJECTIVE:  SUBJECTIVE STATEMENT: ***  PERTINENT HISTORY:  ***  PAIN:  Are you having pain? {OPRCPAIN:27236}  PRECAUTIONS: {Therapy precautions:24002}  RED FLAGS: {PT Red Flags:29287}   WEIGHT BEARING RESTRICTIONS: {Yes ***/No:24003}  FALLS:  Has patient fallen in last 6 months? {fallsyesno:27318}  LIVING ENVIRONMENT: Lives with: {OPRC lives with:25569::"lives with their family"} Lives in: {Lives in:25570} Stairs: {opstairs:27293} Has following equipment at home: {Assistive devices:23999}  OCCUPATION: ***  PLOF: {PLOF:24004}  PATIENT GOALS: ***  NEXT MD VISIT: ***  OBJECTIVE:  Note: Objective measures were completed at Evaluation unless otherwise noted.  DIAGNOSTIC FINDINGS:  ***  PATIENT SURVEYS:  {rehab surveys:24030}  COGNITION: Overall cognitive status: {cognition:24006}     SENSATION: {sensation:27233}  MUSCLE LENGTH: Hamstrings: Right *** deg; Left *** deg Andy Bannister test: Right *** deg; Left *** deg  POSTURE: {posture:25561}  PALPATION: ***  LUMBAR ROM:   AROM eval  Flexion   Extension   Right lateral flexion   Left lateral flexion   Right rotation   Left rotation    (Blank rows = not tested)  LOWER EXTREMITY ROM:     {AROM/PROM:27142}  Right eval Left eval  Hip flexion    Hip extension    Hip abduction    Hip adduction    Hip internal rotation    Hip external rotation    Knee flexion    Knee extension    Ankle dorsiflexion    Ankle plantarflexion    Ankle inversion    Ankle eversion     (Blank rows = not  tested)  LOWER EXTREMITY MMT:    MMT Right eval Left eval  Hip flexion    Hip extension    Hip abduction    Hip adduction    Hip internal rotation    Hip external rotation    Knee flexion    Knee extension    Ankle dorsiflexion    Ankle plantarflexion    Ankle inversion    Ankle eversion     (Blank rows = not tested)  LUMBAR SPECIAL TESTS:  {lumbar special test:25242}  FUNCTIONAL TESTS:  {Functional tests:24029}  GAIT: Distance walked: *** Assistive device utilized: {Assistive devices:23999} Level of assistance: {Levels of assistance:24026} Comments: ***  TREATMENT DATE: ***                                                                                                                                 PATIENT EDUCATION:  Education details: *** Person educated: {Person educated:25204} Education method: {Education Method:25205} Education comprehension: {Education Comprehension:25206}  HOME EXERCISE PROGRAM: ***  ASSESSMENT:  CLINICAL IMPRESSION: Patient is a *** y.o. *** who was seen today for physical therapy evaluation and treatment for ***.   OBJECTIVE IMPAIRMENTS: {opptimpairments:25111}.   ACTIVITY LIMITATIONS: {activitylimitations:27494}  PARTICIPATION LIMITATIONS: {participationrestrictions:25113}  PERSONAL FACTORS: {Personal factors:25162} are also affecting patient's functional outcome.   REHAB POTENTIAL: {rehabpotential:25112}  CLINICAL DECISION MAKING: {clinical decision making:25114}  EVALUATION COMPLEXITY: {Evaluation complexity:25115}  GOALS: Goals reviewed with patient? {yes/no:20286}  SHORT TERM GOALS: Target date: ***  *** Baseline: Goal status: INITIAL  2.  *** Baseline:  Goal status: INITIAL  3.  *** Baseline:  Goal status: INITIAL  4.  *** Baseline:  Goal status: INITIAL  5.  *** Baseline:  Goal status: INITIAL  6.  *** Baseline:  Goal status: INITIAL  LONG TERM GOALS: Target date: ***  *** Baseline:   Goal status: INITIAL  2.  *** Baseline:  Goal status: INITIAL  3.  *** Baseline:  Goal status: INITIAL  4.  *** Baseline:  Goal status: INITIAL  5.  *** Baseline:  Goal status: INITIAL  6.  *** Baseline:  Goal status: INITIAL  PLAN:  PT FREQUENCY: {rehab frequency:25116}  PT DURATION: {rehab duration:25117}  PLANNED INTERVENTIONS: {rehab planned interventions:25118::"97110-Therapeutic exercises","97530- Therapeutic [phone]- Neuromuscular re-education","97535- Self DGUY","40347- Manual therapy"}.  PLAN FOR NEXT SESSION: ***   Lorita Rosa, PT Lorita Rosa, PT, DPT, CSRS  06/18/2023, 7:37 AM

## 2023-07-21 ENCOUNTER — Telehealth: Payer: Self-pay | Admitting: Neurology

## 2023-07-21 NOTE — Telephone Encounter (Signed)
 Pt has had a G tube placed and since having that done she is having really bad migraine and would like to see if we can call her in something to help with this  please call

## 2023-07-22 ENCOUNTER — Other Ambulatory Visit (HOSPITAL_BASED_OUTPATIENT_CLINIC_OR_DEPARTMENT_OTHER): Payer: Self-pay

## 2023-07-22 MED ORDER — HYDROCODONE-ACETAMINOPHEN 10-325 MG PO TABS
0.5000 | ORAL_TABLET | Freq: Four times a day (QID) | ORAL | 0 refills | Status: AC
  Filled 2023-08-26 – 2023-08-27 (×2): qty 120, 30d supply, fill #0

## 2023-07-22 MED ORDER — HYDROCODONE-ACETAMINOPHEN 10-325 MG PO TABS
1.0000 | ORAL_TABLET | Freq: Four times a day (QID) | ORAL | 0 refills | Status: DC | PRN
  Filled 2023-07-22: qty 120, 30d supply, fill #0

## 2023-07-22 MED ORDER — FENTANYL 25 MCG/HR TD PT72
1.0000 | MEDICATED_PATCH | TRANSDERMAL | 0 refills | Status: AC
  Filled 2023-07-22: qty 10, 30d supply, fill #0

## 2023-07-22 MED ORDER — HYDROMORPHONE HCL 2 MG PO TABS
2.0000 mg | ORAL_TABLET | Freq: Three times a day (TID) | ORAL | 0 refills | Status: AC | PRN
  Filled 2023-07-22: qty 12, 4d supply, fill #0

## 2023-07-22 MED ORDER — FENTANYL 25 MCG/HR TD PT72
1.0000 | MEDICATED_PATCH | TRANSDERMAL | 0 refills | Status: AC | PRN
  Filled 2023-08-26 – 2023-08-27 (×2): qty 10, 30d supply, fill #0

## 2023-07-23 NOTE — Telephone Encounter (Signed)
 Tried calling Dr.Keri Rosalina Nurse line no answer. LMOVM to call the office back.

## 2023-07-23 NOTE — Telephone Encounter (Signed)
 Pt called in wanting to follow up on what she can do for her migraine.

## 2023-07-24 NOTE — Telephone Encounter (Signed)
 Per patient she is having a consent headache( Daily) every night,sometimes she can take her imtrex and it will work for a while then come back.   Every night and til the morning during the Tube feeding.   Then a dull headache during the day.    Patient wants to have Fioricet during her tube feeding. To help with the headaches.

## 2023-07-25 ENCOUNTER — Telehealth: Payer: Self-pay | Admitting: Neurology

## 2023-07-25 NOTE — Telephone Encounter (Signed)
 Pt rtnd call from [facility]

## 2023-07-25 NOTE — Telephone Encounter (Signed)
 Per patient she will like to try the Nurtec.   Nurtec samples at the front desk.

## 2023-07-25 NOTE — Telephone Encounter (Signed)
 Tried calling patient, no answer. LMOVM for patient to call the office back.   Per Dr.Jaffe, She cannot take Fioricet every night - that is not healthy and will easily lead to rebound headache. I would like to prescribe her Nurtec to take at bedtime. The only problem is that insurance companies will only allow no more than 16 tablets a month. We can try to get it approved with a letter of medical necessity explaining the situation that her migraines are triggered by these tube feedings every night. I would really like for her to try some samples first to make sure it is helpful before trying to get prior approval

## 2023-08-19 ENCOUNTER — Telehealth: Payer: Self-pay | Admitting: Neurology

## 2023-08-19 NOTE — Telephone Encounter (Signed)
 Pt left a VM about her medication I forward the VM to [facility] VM  so that she could hear what she was saying about refills

## 2023-08-20 NOTE — Progress Notes (Deleted)
 Office Visit Note  Patient: Kayla [name]             Date of Birth: Jun 06, 1945           MRN: [id number]             PCP: Waylan Almarie SAUNDERS, MD Referring: Waylan Almarie SAUNDERS, MD Visit Date: 09/02/2023 Occupation: @GUAROCC @  Subjective:  No chief complaint on file.   History of Present Illness: Kayla [name] is a 78 y.o. female ***     Activities of Daily Living:  Patient reports morning stiffness for *** {minute/hour:19697}.   Patient {ACTIONS;DENIES/REPORTS:21021675::Denies} nocturnal pain.  Difficulty dressing/grooming: {ACTIONS;DENIES/REPORTS:21021675::Denies} Difficulty climbing stairs: {ACTIONS;DENIES/REPORTS:21021675::Denies} Difficulty getting out of chair: {ACTIONS;DENIES/REPORTS:21021675::Denies} Difficulty using hands for taps, buttons, cutlery, and/or writing: {ACTIONS;DENIES/REPORTS:21021675::Denies}  No Rheumatology ROS completed.   PMFS History:  Patient Active Problem List   Diagnosis Date Noted   Recurrent UTI 07/13/2022   Iron deficiency anemia due to dietary causes 04/18/2021   Peripheral neuropathy 04/03/2021   Chronic kidney disease 03/22/2021   Macular degeneration 03/22/2021   PTSD (post-traumatic stress disorder)    Epigastric pain 11/11/2019   Anorexia nervosa 11/11/2019   LLQ abdominal pain 11/11/2019   Malnutrition 11/11/2019   Mechanical breakdown implanted electronic neurostimulator spinal cord 11/16/2018   Esophageal dysphagia 10/03/2018   Neuropathic pain 01/23/2018   MVP (mitral valve prolapse) 12/17/2017   Rosacea 11/02/2017   Small intestinal bacterial overgrowth 08/05/2017   Orthostatic hypotension 11/20/2016   Type II diabetes mellitus 11/18/2016   Hypoglycemia 11/08/2016   Cervical facet joint syndrome 10/08/2016   Anal fissure 10/08/2016   Peripheral vascular disease 01/22/2016   Hypertension 01/22/2016   History of hyperparathyroidism 01/22/2016   Mitral regurgitation 12/11/2015   History of gastric  bypass 02/11/2014   Chronic maxillary sinusitis 09/15/2013   History of colonic polyps 07/22/2013   HA (headache)-chronic/tension type 07/28/2012   Osteoporosis 07/28/2012   Nephrolithiasis 07/28/2012   Fibromyalgia 07/26/2012   Chronic interstitial cystitis 10/16/2011   Chronic constipation 10/04/2010   Loss of weight 11/27/2009   Generalized anxiety disorder 03/18/2007   Adjustment disorder with depressed mood 03/18/2007   GERD (gastroesophageal reflux disease) 03/18/2007   Diaphragmatic hernia 03/18/2007   Osteoarthritis 03/18/2007   Degeneration of cervical intervertebral disc 03/18/2007   Degeneration of lumbar or lumbosacral intervertebral disc 03/18/2007   Myalgia 03/18/2007    Past Medical History:  Diagnosis Date   Adjustment disorder with depressed mood 03/18/2007   Allergy    Anal fissure 10/08/2016   Angina    takes Propanolol prn and it stops her chest   Anorexia nervosa 11/11/2019   Barrett esophagus    not consistently present   Cataract    removed  but had retinal detachment - repeat cataract right eye   Cervical facet joint syndrome 10/08/2016   Chronic constipation 10/04/2010   Chronic interstitial cystitis 10/16/2011   Chronic kidney disease    kidney stones and UTI   Chronic maxillary sinusitis 09/15/2013   Chronic narcotic dependence 10/03/2018   Complication of anesthesia    either  BP or pulse dropped with last surgery   Degeneration of cervical intervertebral disc 03/18/2007   Degeneration of lumbar or lumbosacral intervertebral disc 03/18/2007   Diaphragmatic hernia 03/18/2007   Dysrhythmia    brought on by stress   Epigastric pain 11/11/2019   Esophageal dysphagia 10/03/2018   External hemorrhoids    Fibromyalgia    neuropathy knees ankles and toes, bladder   Generalized anxiety  disorder 03/18/2007   GERD (gastroesophageal reflux disease) 03/18/2007   HA (headache)-chronic/tension type 07/28/2012   Heart murmur    Hiatal hernia     History of gastric bypass 02/11/2014   1999 at [facility]   History of hyperparathyroidism 01/22/2016   History of kidney stones    Hypertension    No longer on meds   Hypoglycemia 11/08/2016   IBS (irritable bowel syndrome)    Internal hemorrhoids    Kidney stones    multiple per patient   LLQ abdominal pain 11/11/2019   Macular degeneration    Malnutrition 11/11/2019   Mechanical breakdown implanted electronic neurostimulator spinal cord 11/16/2018   Mitral regurgitation 12/11/2015   MVP (mitral valve prolapse) 12/17/2017   Myalgia 03/18/2007   Nephrolithiasis 07/28/2012   lithotrip 2013-Eskridge,MD   Neuropathic pain 01/23/2018   Orthostatic hypotension 11/20/2016   Osteoarthritis    all over   Osteopetrosis    Osteoporosis 07/28/2012   Peripheral vascular disease    superficial phlebitis in left calf   Personal history of colonic polyps 07/22/2013   07/2013 - 3 small adenomas - repeat colonoscopy 2018   PTSD (post-traumatic stress disorder)    Reactive hypoglycemia    Rosacea 11/02/2017   Small intestinal bacterial overgrowth 08/05/2017   + lactulose breath test 06/2017 Xifaxan  Rx   Type II diabetes mellitus 11/18/2016   Pt states now diagnosed with reactive hypoglycemia   Urinary tract infection     Family History  Problem Relation Age of Onset   Stroke Mother    Lung cancer Mother    Heart disease Mother    Diabetes Mother    Hypertension Father    Stroke Father    Heart disease Father    Kidney disease Father    Seizures Father        epilepsy, and sisters x 2   Memory loss Father    Lung cancer Sister    Breast cancer Sister    Cancer - Lung Sister    Heart disease Sister    Diabetes Sister    Pancreatic cancer Sister    Healthy Daughter    Healthy Son    Colon cancer Maternal Aunt        12 relatives   Colon cancer Paternal Aunt    Colon cancer Paternal Aunt    Uterine cancer Other        aunt   Heart disease Other        grandmother    Esophageal cancer Neg Hx    Rectal cancer Neg Hx    Stomach cancer Neg Hx    Colon polyps Neg Hx    Past Surgical History:  Procedure Laterality Date   ABDOMINAL HYSTERECTOMY     APPENDECTOMY     CARDIAC CATHETERIZATION  03/22/1991   EF 61%   CARDIOVASCULAR STRESS TEST  10/03/2006   CERVICAL SPINE SURGERY  02/14/2022   BLOCK   CHOLECYSTECTOMY     COLONOSCOPY  06/23/2008   normal   CYSTOSCOPY W/ URETERAL STENT REMOVAL Bilateral 03/31/2023   Procedure: CYSTOSCOPY WITH BILATERAL STENT REMOVAL;  Surgeon: Carolee Sherwood JONETTA DOUGLAS, MD;  Location: [facility];  Service: Urology;  Laterality: Bilateral;  30 MINUTE CASE   CYSTOSCOPY/URETEROSCOPY/HOLMIUM LASER/STENT PLACEMENT Bilateral 03/24/2023   Procedure: CYSTOSCOPY/BILATERAL URETEROSCOPY/HOLMIUM LASER/STENT PLACEMENT;  Surgeon: Carolee Sherwood JONETTA DOUGLAS, MD;  Location: [facility];  Service: Urology;  Laterality: Bilateral;  90 MINUTE CASE   DILATION AND CURETTAGE OF UTERUS  x2   ESOPHAGUS SURGERY     EXPLORATORY LAPAROTOMY  1993   EYE SURGERY     GASTRIC BYPASS  1999   GASTRIC RESTRICTION SURGERY  1991   LASIK Bilateral    Right Arm Surgery     Right Knee Arthroscopy     Rt wrist fx  2009   SPINAL CORD STIMULATOR IMPLANT     spinal surgery battery implant     stomach stappeling  1991   STONE EXTRACTION WITH BASKET  2012   TONSILLECTOMY     TUBAL LIGATION     UPPER GASTROINTESTINAL ENDOSCOPY  06/23/2008   w/Dil, Barrett's esophagus   US  ECHOCARDIOGRAPHY  01/21/2007   EF 55-60%   US  ECHOCARDIOGRAPHY  08/30/2003   EF 55-60%   Social History   Social History Narrative   Right handed   Two story home   She retired on disability   Married   2 Children   Immunization History  Administered Date(s) Administered   Hepatitis B, ADULT 04/21/2017   Influenza Split 10/02/2011   Influenza Whole 08/21/2007   Influenza, High Dose Seasonal PF 12/29/2017   Influenza,inj,Quad PF,6+ Mos 12/15/2013, 11/23/2014, 09/27/2015, 10/04/2016    Influenza-Unspecified 12/15/2013, 11/23/2014, 09/27/2015, 10/04/2016   PFIZER(Purple Top)SARS-COV-2 Vaccination 03/31/2019, 04/20/2019   Pneumococcal Conjugate-13 09/05/2014   Pneumococcal Polysaccharide-23 10/02/2011, 02/08/2013   Td 01/14/2006   Tdap 01/22/2016     Objective: Vital Signs: There were no vitals taken for this visit.   Physical Exam   Musculoskeletal Exam: ***  CDAI Exam: CDAI Score: -- Patient Global: --; Provider Global: -- Swollen: --; Tender: -- Joint Exam 09/02/2023   No joint exam has been documented for this visit   There is currently no information documented on the homunculus. Go to the Rheumatology activity and complete the homunculus joint exam.  Investigation: No additional findings.  Imaging: No results found.  Recent Labs: Lab Results  Component Value Date   WBC 6.9 05/15/2023   HGB 13.3 05/15/2023   PLT 339 05/15/2023   NA 140 05/15/2023   K 4.2 05/15/2023   CL 103 05/15/2023   CO2 31 05/15/2023   GLUCOSE 94 05/15/2023   BUN 26 (H) 05/15/2023   CREATININE 0.91 05/15/2023   BILITOT 0.4 05/15/2023   ALKPHOS 96 05/15/2023   AST 65 (H) 05/15/2023   ALT 65 (H) 05/15/2023   PROT 6.9 05/15/2023   ALBUMIN 4.2 05/15/2023   CALCIUM 9.6 05/15/2023   GFRAA 95 05/05/2020    Speciality Comments: DEXA [facility] Fosamax  2013-2018 Patient declined Tymlos and Forteo-travels frequently. Prolia  May 26, 2020, January 26, 2021  Procedures:  No procedures performed Allergies: Ambien [zolpidem tartrate], Codeine, Oxycodone-acetaminophen , Tylox [oxycodone-acetaminophen ], Zolpidem tartrate, Celebrex  [celecoxib ], Darvocet [propoxyphene n-acetaminophen ], Darvon [propoxyphene], Lorcet [hydrocodone -acetaminophen ], Opium, Vicodin [hydrocodone -acetaminophen ], Wheat, Amoxicillin , and Penicillins   Assessment / Plan:     Visit Diagnoses: No diagnosis found.  Orders: No orders of the defined types were placed in this encounter.  No orders of  the defined types were placed in this encounter.   Face-to-face time spent with patient was *** minutes. Greater than 50% of time was spent in counseling and coordination of care.  Follow-Up Instructions: No follow-ups on file.   Daved JAYSON Gavel, CMA  Note - This record has been created using Animal nutritionist.  Chart creation errors have been sought, but may not always  have been located. Such creation errors do not reflect on  the standard of medical care.

## 2023-08-22 ENCOUNTER — Other Ambulatory Visit: Payer: Self-pay

## 2023-08-22 MED ORDER — RIZATRIPTAN BENZOATE 10 MG PO TBDP: 10.0000 mg | ORAL_TABLET | ORAL | 0 refills | Status: DC | PRN

## 2023-08-22 NOTE — Telephone Encounter (Signed)
 Message left by Patient refills needed for Rizatriptan . Please send to [facility].    Rizatriptan  sent in.

## 2023-08-25 NOTE — Progress Notes (Signed)
 NEUROLOGY FOLLOW UP OFFICE NOTE  Kayla [name] [id number]  Assessment/Plan:   Chronic migraine without aura, without status migrainosus, not intractable - she was well-controlled on Emgality  until she started needing tube feedings which have triggered them Idiopathic polyneuropathy Right upper extremity pain - consider ulnar neuropathy Chronic pain syndrome   Migraine prevention:  Continue Emgality  as it has been significantly helpful.  However, I would like to add Botox to supplement the Emgality  in trying to control migraines as she receives tube feedings. Migraine rescue:  Rizatriptan  10mg .  Will have her try samples of Ubrelvy 100mg .  I would rather that she not take Fioricet due to propensity to cause rebound headache.  Zofran  for nausea For neuralgia:  Lyrica  50mg  in AM and 100mg  in PM; Cymbalta  30mg  twice daily. To evaluate for any damage of hardware in wrist, will check X-ray of right wrist For further evaluation of right upper extremity dysesthesias, NCV-EMG or right upper extremity Limit use of pain relievers to no more than 9 days out of the month to prevent risk of rebound or medication-overuse headache. Keep headache diary Follow up 8 months. Of note, her G-tube may have dislodged while here.  Provided gauze and advised to contact the surgeon's office immediately  Total time spent on today's visit was 43 minutes dedicated to this patient today, preparing to see patient, examining the patient, ordering tests and/or medications and counseling the patient, documenting clinical information in the EHR or other health record, independently interpreting results and communicating results to the patient/family, discussing treatment and goals, answering patient's questions and coordinating care.      Subjective:  Kayla [name] is a 78 year old woman with fibromyalgia, type 2 diabetes, anxiety with history of PTSD, degenerative disc disease of cervical and lumbar spine, and  history of nephrolithiasis and gastric bypass who follows up for polyneuropathy, migraine   UPDATE: Migraine: She began losing weight again, so she now has a G-tube.  She gets feedings for 12 hours during the night.  Just like the TPN, it triggers a migraine.  They are planning on changing the nutrients in the solution to help minimize the headaches.  She tried Nurtec which is ineffective.  In the meantime, her other provider has again prescribed Fioricet.  It is expected that she will remain on a G-tube until planned surgery in January.    Neuropathy/Chronic pain Taking Lyrica  50mg  in AM and 100mg  in PM (higher doses caused cognitive clouding), duloxetine  30mg  twice daily (intolerant to higher doses). Fentanyl  patch, Norco.  No longer getting nerve blocks (ineffective)  For several months, endorses painful burning and searing pain in the ulnar side of her right hand and forearm..  Advised to wear wrist splint.  Ineffective but causes burning down the forearm.  Fingers lock up.  Can't type.  Increased Cymbalta  and advised to try elbow pads.  The increased Cymbalta  upset her stomach so she decreased back to 30mg  BID.  She now also experiences severe non-radiating sharp pain in the wrist.  She does have history of wrist surgery with a plate.    Polyneuropathy has gotten worse.  Now involves the fingers.  Cannot type.    Medications: Current NSAIDS:  flurbiprofen ; Celebrex  Current analgesics:  Excedrin Migraine, Lidoderm , voltaren  1%, Fentanyl  patch Current triptans:  rizatriptan  10mg  Current ergotamine:  no Current anti-emetic:  Zofran  8mg  Current muscle relaxants:  Flexeril  10mg   Current anti-anxiolytic:  clonazepam  Current Antihypertensive medications:  Lasix  Current Antidepressant medications:  Cymbalta  30mg  BID  Current Anticonvulsant medications:  Lyrica  50mg  in AM and 100mg  at night, Current anti-CGRP:  Emgality  Current Vitamins/Herbal/Supplements:  turmeric Current  Antihistamines/Decongestants:  no    Caffeine :  2 cups of coffee daily Alcohol :  no Smoker:  no Diet:  poor Exercise:  no Depression:  no; Anxiety:  yes Sleep hygiene:  poor Chronic pain syndrome:  Followed by pain management.  I have her on Lyrica  and Cymbalta    HISTORY: Idiopathic polyneuropathy:   Since July 2018, she has had increased swelling and burning numbness and tingling in the legs.  She had lower extremity vascular ABIs on 08/20/16, which was negative.  She has history of B12 deficiency but she takes injections and recent B12 level from 09/21/16 was over 2000.  Methylmalonic acid level was 209, RPR nonreactive, homocysteine 7.8.  Labs from 07/12/16 include normal SPEP, Sed Rate 2, CRP less than 0.3, and TSH 2.430 .  Vitamin D  was 23 and she was advised to start supplementation.  Serum glucose has been 70s up to 110.  She also takes B6.  She also has history of weight loss.  She has fibromyalgia and was previously diagnosed with neuropathy by another neurologist.  She has a spinal cord stimulator.  She has been on gabapentin  for many years.  It was briefly discontinued to see if it was contributing to the swelling.  She reported no significant change, so it was restarted (she takes 300mg  4 times daily), although she thinks it helped a little bit.  NCV-EMG from 10/21/16 demonstrated chronic sensorimotor polyneuropathy of the predominantly axonal type as well as mild left median neuropathy at or distal to the wrist.  Other labs include B1 146.1, B6 10.7, Sed Rate 2, HIV negative, TSH 1.780, UPEP negative.  She went to [facility] for evaluation of neuropathy.  Selenium, Zinc , paraneoplastic panel and genetic testing Copper  was low, so she was advised to start supplement.  B6 was still elevated despite stopping supplements 3 prior.     She was wondering if her neuropathy could be secondary to [employer] exposure.  Her husband was a [place] veteran and reports cases of spouses exposed to [employer]  through their husband's semen.  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  Her neuropathic symptoms started shortly after her return.       Migraine: Onset:  Since 1970, after her twin newborns passed away.  She has lived with life-long family-related stress Location:  Migraines are unilateral/parietal or bilateral retro-orbital, chronic tension type (bifrontal) Quality:  Severe crushing Initial Intensity:  10/10 Aura:  no Prodrome:  no Associated symptoms:  Photophobia, phonophobia.  No nausea, vomiting or visual disturbance Initial Duration:  Migraines 1 hour with sumatriptan , other headaches 15 minutes with Fioricet Initial Frequency:  Daily (3 to 4 days a month are severe migraine) Triggers/exacerbating factors:  Stress, Nexium  Relieving factors:  Clonazepam , Fioricet, sumatriptan  Activity:  Cannot function when severe (3-4 days per month)   Past NSAIDS:  Ibuprofen , naproxen Past analgesics:  Cafergot, Excedrin, Tylenol , Tramadol, Fioricet Past abortive triptans:  sumatriptan  tab Past muscle relaxants:  tizanidine , Flexeril  Past anti-emetic:  Zofran  ODT 8mg  Past antihypertensive medications:  Lopressor , propranolol  Past antidepressant medications:  amitriptyline, Effexor, Cymbalta , Zoloft Past anticonvulsant medications:  Topiramate, gabapentin  300mg  twice daily Past CGRP-inhibitor:  Aimovig  140mg , Nurtec PRN  Past vitamins/Herbal/Supplements:  CoQ10 Other past therapies:  yoga, physical therapy   Family history of headache:  Father, sister, brother  Memory Complaints: She mentioned trouble with short  term memory.  Neuropsychological evaluation on 03/23/2021 was within normal limits and likely he memory complaints are related to PTSD, chronic pain and poor sleep.     Imaging: MRI and MRA of brain from 07/23/11 were personally reviewed and unremarkable.  PAST MEDICAL HISTORY: Past Medical History:  Diagnosis Date   Adjustment disorder with depressed mood  03/18/2007   Allergy    Anal fissure 10/08/2016   Angina    takes Propanolol prn and it stops her chest   Anorexia nervosa 11/11/2019   Barrett esophagus    not consistently present   Cataract    removed  but had retinal detachment - repeat cataract right eye   Cervical facet joint syndrome 10/08/2016   Chronic constipation 10/04/2010   Chronic interstitial cystitis 10/16/2011   Chronic kidney disease    kidney stones and UTI   Chronic maxillary sinusitis 09/15/2013   Chronic narcotic dependence 10/03/2018   Complication of anesthesia    either  BP or pulse dropped with last surgery   Degeneration of cervical intervertebral disc 03/18/2007   Degeneration of lumbar or lumbosacral intervertebral disc 03/18/2007   Diaphragmatic hernia 03/18/2007   Dysrhythmia    brought on by stress   Epigastric pain 11/11/2019   Esophageal dysphagia 10/03/2018   External hemorrhoids    Fibromyalgia    neuropathy knees ankles and toes, bladder   Generalized anxiety disorder 03/18/2007   GERD (gastroesophageal reflux disease) 03/18/2007   HA (headache)-chronic/tension type 07/28/2012   Heart murmur    Hiatal hernia    History of gastric bypass 02/11/2014   1999 at [facility]   History of hyperparathyroidism 01/22/2016   History of kidney stones    Hypertension    No longer on meds   Hypoglycemia 11/08/2016   IBS (irritable bowel syndrome)    Internal hemorrhoids    Kidney stones    multiple per patient   LLQ abdominal pain 11/11/2019   Macular degeneration    Malnutrition 11/11/2019   Mechanical breakdown implanted electronic neurostimulator spinal cord 11/16/2018   Mitral regurgitation 12/11/2015   MVP (mitral valve prolapse) 12/17/2017   Myalgia 03/18/2007   Nephrolithiasis 07/28/2012   lithotrip 2013-[place],MD   Neuropathic pain 01/23/2018   Orthostatic hypotension 11/20/2016   Osteoarthritis    all over   Osteopetrosis    Osteoporosis 07/28/2012   Peripheral vascular  disease    superficial phlebitis in left calf   Personal history of colonic polyps 07/22/2013   07/2013 - 3 small adenomas - repeat colonoscopy 2018   PTSD (post-traumatic stress disorder)    Reactive hypoglycemia    Rosacea 11/02/2017   Small intestinal bacterial overgrowth 08/05/2017   + lactulose breath test 06/2017 Xifaxan  Rx   Type II diabetes mellitus 11/18/2016   Pt states now diagnosed with reactive hypoglycemia   Urinary tract infection     MEDICATIONS: Current Outpatient Medications on File Prior to Visit  Medication Sig Dispense Refill   aspirin-acetaminophen -caffeine  (EXCEDRIN MIGRAINE) 250-250-65 MG tablet Take 1 tablet by mouth in the morning, at noon, in the evening, and at bedtime.     Baclofen  5 MG TABS Take 1 tablet (5 mg total) by mouth 3 (three) times daily as needed. 30 tablet 5   Biotin 1000 MCG CHEW Chew 1,000 mcg by mouth daily.     COLLAGEN PO Take 1 each by mouth daily.     Continuous Blood Gluc Receiver (DEXCOM G7 RECEIVER) DEVI by Does not apply route.     cycloSPORINE  (  RESTASIS ) 0.05 % ophthalmic emulsion Place 1 drop into both eyes 2 (two) times daily. 5.5 mL 5   diclofenac  Sodium (VOLTAREN ) 1 % GEL Apply 2 to 4 gram to the affected area(s) (painful joints) up to 4 times daily as needed. 600 g 1   dicyclomine  (BENTYL ) 10 MG capsule Take 1-2 tab 3 times daily AC as needed for spasms and cramping. 270 capsule 1   Docusate Sodium  (DSS) 100 MG CAPS Take 100 mg by mouth daily.     DULoxetine  (CYMBALTA ) 30 MG capsule Take 1 capsule (30 mg total) by mouth 2 (two) times daily. 180 capsule 2   famotidine  (PEPCID ) 20 MG tablet Take 20 mg by mouth daily as needed for heartburn or indigestion.     fentaNYL  (DURAGESIC ) 25 MCG/HR Place 1 patch onto the skin every 3 (three) days (every 72 hours). 10 patch 0   fentaNYL  (DURAGESIC ) 25 MCG/HR Place 1 patch onto the skin every three (3) days as needed. 10 patch 0   Galcanezumab -gnlm (EMGALITY ) 120 MG/ML SOAJ Inject 120 mg into  the skin every 28 (twenty-eight) days. 3 mL 3   glucagon  1 MG injection Inject 1 mg into the muscle once as needed for up to 1 dose. 1 each 12   HYDROcodone -acetaminophen  (NORCO) 10-325 MG tablet Take 1 tablet by mouth 4 (four) times daily as needed for pain. Max daily dose: 4 tablets. 120 tablet 0   HYDROcodone -acetaminophen  (NORCO) 10-325 MG tablet Take 0.5-1 tablets by mouth 4 (four) times daily, if needed for pain (max daily dose: 4 tablets) 120 tablet 0   hydrocortisone cream 1 % Apply 1 Application topically daily as needed for itching.     HYDROmorphone  (DILAUDID ) 2 MG tablet Take 1 tablet (2 mg total) by mouth 3 (three) times daily as needed for abdominal pain. 12 tablet 0   hypromellose (GENTEAL) 0.3 % GEL ophthalmic ointment Place 1 drop into both eyes at bedtime. Reported on 02/11/2015     latanoprost (XALATAN) 0.005 % ophthalmic solution Place 1 drop into both eyes at bedtime.     lidocaine  (LIDODERM ) 5 % Place 1 patch onto the skin as needed. Remove & Discard patch within 12 hours. May dispense as 3 month supply (Patient taking differently: Place 1 patch onto the skin daily as needed (pain). Remove & Discard patch within 12 hours. May dispense as 3 month supply) 90 patch 3   magnesium  hydroxide (MILK OF MAGNESIA) 400 MG/5ML suspension Take 5 mLs by mouth daily as needed for moderate constipation.     metroNIDAZOLE  (METROGEL ) 1 % gel Apply topically daily. 45 g 0   mirabegron  ER (MYRBETRIQ ) 25 MG TB24 tablet Take 1 tablet (25 mg total) by mouth daily. 90 tablet 1   naloxegol  oxalate (MOVANTIK ) 25 MG TABS tablet Take 25 mg by mouth daily.     ondansetron  (ZOFRAN ) 8 MG tablet Take 1 tablet (8 mg total) by mouth every 8 (eight) hours as needed for nausea or vomiting. 60 tablet 0   OVER THE COUNTER MEDICATION Take 1 Scoop by mouth daily. Copper , Selenium and Zinc  Powder     pregabalin  (LYRICA ) 50 MG capsule Take 1 capsule in morning and 2 capsules at night (Patient taking differently: Take 50  mg by mouth 2 (two) times daily.) 270 capsule 3   Probiotic Product (PROBIOTIC-10 PO) Take 1 capsule by mouth daily.     rizatriptan  (MAXALT -MLT) 10 MG disintegrating tablet Take 1 tablet (10 mg total) by mouth as needed for migraine.  May repeat in 2 hours if needed 10 tablet 0   solifenacin  (VESICARE ) 5 MG tablet Take 5 mg by mouth daily.     sucralfate  (CARAFATE ) 1 GM/10ML suspension Take 10 mLs (1 g total) by mouth 4 (four) times daily. (Patient taking differently: Take 1 g by mouth daily as needed (acid reflux).) 420 mL 1   No current facility-administered medications on file prior to visit.    ALLERGIES: Allergies  Allergen Reactions   Ambien [Zolpidem Tartrate]     Hallucinations   Codeine Anaphylaxis   Oxycodone-Acetaminophen  Shortness Of Breath   Tylox [Oxycodone-Acetaminophen ] Anaphylaxis    Chest pain   Zolpidem Tartrate Anaphylaxis and Other (See Comments)    Other Reaction(s): Hallucination  Other reaction(s): Other, hallucinations, Other reaction(s): Other, hallucinations, hallucinations   Celebrex  [Celecoxib ] Other (See Comments)    Stomach cramping   Darvocet [Propoxyphene N-Acetaminophen ]     Chest pain   Darvon [Propoxyphene] Itching   Lorcet [Hydrocodone -Acetaminophen ]     Chest pain   Opium     hallucinations   Vicodin [Hydrocodone -Acetaminophen ] Other (See Comments)    Chest Pain    Wheat    Amoxicillin  Nausea And Vomiting    Reaction:  Migraine headache   Penicillins Nausea And Vomiting    Migraine Headaches    FAMILY HISTORY: Family History  Problem Relation Age of Onset   Stroke Mother    Lung cancer Mother    Heart disease Mother    Diabetes Mother    Hypertension Father    Stroke Father    Heart disease Father    Kidney disease Father    Seizures Father        epilepsy, and sisters x 2   Memory loss Father    Lung cancer Sister    Breast cancer Sister    Cancer - Lung Sister    Heart disease Sister    Diabetes Sister    Pancreatic  cancer Sister    Healthy Daughter    Healthy Son    Colon cancer Maternal Aunt        12 relatives   Colon cancer Paternal Aunt    Colon cancer Paternal Aunt    Uterine cancer Other        aunt   Heart disease Other        grandmother   Esophageal cancer Neg Hx    Rectal cancer Neg Hx    Stomach cancer Neg Hx    Colon polyps Neg Hx       Objective:  Blood pressure 117/61, pulse 90, height 5' 6 (1.676 m), weight 115 lb (52.2 kg), SpO2 98%. General: No acute distress.  Patient appears well-groomed.       Juliene Dunnings, DO   CC:  Almarie Scala, MD

## 2023-08-26 ENCOUNTER — Telehealth: Payer: Self-pay

## 2023-08-26 ENCOUNTER — Other Ambulatory Visit (HOSPITAL_BASED_OUTPATIENT_CLINIC_OR_DEPARTMENT_OTHER): Payer: Self-pay

## 2023-08-26 ENCOUNTER — Ambulatory Visit (INDEPENDENT_AMBULATORY_CARE_PROVIDER_SITE_OTHER): Admitting: Neurology

## 2023-08-26 VITALS — BP 117/61 | HR 90 | Ht 66.0 in | Wt 115.0 lb

## 2023-08-26 DIAGNOSIS — M25531 Pain in right wrist: Secondary | ICD-10-CM

## 2023-08-26 DIAGNOSIS — G43709 Chronic migraine without aura, not intractable, without status migrainosus: Secondary | ICD-10-CM | POA: Diagnosis not present

## 2023-08-26 DIAGNOSIS — R200 Anesthesia of skin: Secondary | ICD-10-CM

## 2023-08-26 MED ORDER — PREGABALIN 50 MG PO CAPS: ORAL_CAPSULE | ORAL | 3 refills | Status: AC

## 2023-08-26 MED ORDER — DULOXETINE HCL 30 MG PO CPEP: 30.0000 mg | ORAL_CAPSULE | Freq: Two times a day (BID) | ORAL | 2 refills | Status: AC

## 2023-08-26 NOTE — Patient Instructions (Addendum)
 Continue Emgality .  Will try to also start Botox Take Ubrelvy at earliest onset of migraine.  May repeat after 2 hours.  Maximum 2 tablets in 24 hours.  Let me know if it works.  May use rizatriptan .  Limit use of pain relievers to no more than 9 days out of the month to prevent risk of rebound or medication-overuse headache.  Try to avoid Fioricet if possible X-ray of right wrist Nerve conduction study of right upper extremity Continue Lyrica  and Cymbalta  Follow up 8 months

## 2023-08-26 NOTE — Telephone Encounter (Signed)
 PA botox 200 units

## 2023-08-26 NOTE — Progress Notes (Signed)
 Medication Samples have been provided to the patient.  Drug name: Holland       Strength: 100 mg        Qty: 4  LOT: [id number]  Exp.Date: 12/26  Dosing instructions: as needed  The patient has been instructed regarding the correct time, dose, and frequency of taking this medication, including desired effects and most common side effects.   [place] JINNY Sharper 1:53 PM 08/26/2023

## 2023-08-27 ENCOUNTER — Other Ambulatory Visit (HOSPITAL_COMMUNITY): Payer: Self-pay

## 2023-08-27 ENCOUNTER — Other Ambulatory Visit (HOSPITAL_BASED_OUTPATIENT_CLINIC_OR_DEPARTMENT_OTHER): Payer: Self-pay

## 2023-08-27 ENCOUNTER — Telehealth: Payer: Self-pay | Admitting: Neurology

## 2023-08-27 NOTE — Telephone Encounter (Signed)
 LMOM  to make some appt for patient   Rt arm EMG  BOTOX in Sept  8 month follow up with Dr Skeet    Will try back

## 2023-08-27 NOTE — Progress Notes (Deleted)
 Office Visit Note  Patient: Kayla [name]             Date of Birth: 02-16-1945           MRN: [id number]             PCP: Waylan Almarie SAUNDERS, MD Referring: Waylan Almarie SAUNDERS, MD Visit Date: 09/10/2023 Occupation: @GUAROCC @  Subjective:  No chief complaint on file.   History of Present Illness: Kayla [name] is a 78 y.o. female ***     Activities of Daily Living:  Patient reports morning stiffness for *** {minute/hour:19697}.   Patient {ACTIONS;DENIES/REPORTS:21021675::Denies} nocturnal pain.  Difficulty dressing/grooming: {ACTIONS;DENIES/REPORTS:21021675::Denies} Difficulty climbing stairs: {ACTIONS;DENIES/REPORTS:21021675::Denies} Difficulty getting out of chair: {ACTIONS;DENIES/REPORTS:21021675::Denies} Difficulty using hands for taps, buttons, cutlery, and/or writing: {ACTIONS;DENIES/REPORTS:21021675::Denies}  No Rheumatology ROS completed.   PMFS History:  Patient Active Problem List   Diagnosis Date Noted   Recurrent UTI 07/13/2022   Iron deficiency anemia due to dietary causes 04/18/2021   Peripheral neuropathy 04/03/2021   Chronic kidney disease 03/22/2021   Macular degeneration 03/22/2021   PTSD (post-traumatic stress disorder)    Epigastric pain 11/11/2019   Anorexia nervosa 11/11/2019   LLQ abdominal pain 11/11/2019   Malnutrition 11/11/2019   Mechanical breakdown implanted electronic neurostimulator spinal cord 11/16/2018   Esophageal dysphagia 10/03/2018   Neuropathic pain 01/23/2018   MVP (mitral valve prolapse) 12/17/2017   Rosacea 11/02/2017   Small intestinal bacterial overgrowth 08/05/2017   Orthostatic hypotension 11/20/2016   Type II diabetes mellitus 11/18/2016   Hypoglycemia 11/08/2016   Cervical facet joint syndrome 10/08/2016   Anal fissure 10/08/2016   Peripheral vascular disease 01/22/2016   Hypertension 01/22/2016   History of hyperparathyroidism 01/22/2016   Mitral regurgitation 12/11/2015   History of gastric  bypass 02/11/2014   Chronic maxillary sinusitis 09/15/2013   History of colonic polyps 07/22/2013   HA (headache)-chronic/tension type 07/28/2012   Osteoporosis 07/28/2012   Nephrolithiasis 07/28/2012   Fibromyalgia 07/26/2012   Chronic interstitial cystitis 10/16/2011   Chronic constipation 10/04/2010   Loss of weight 11/27/2009   Generalized anxiety disorder 03/18/2007   Adjustment disorder with depressed mood 03/18/2007   GERD (gastroesophageal reflux disease) 03/18/2007   Diaphragmatic hernia 03/18/2007   Osteoarthritis 03/18/2007   Degeneration of cervical intervertebral disc 03/18/2007   Degeneration of lumbar or lumbosacral intervertebral disc 03/18/2007   Myalgia 03/18/2007    Past Medical History:  Diagnosis Date   Adjustment disorder with depressed mood 03/18/2007   Allergy    Anal fissure 10/08/2016   Angina    takes Propanolol prn and it stops her chest   Anorexia nervosa 11/11/2019   Barrett esophagus    not consistently present   Cataract    removed  but had retinal detachment - repeat cataract right eye   Cervical facet joint syndrome 10/08/2016   Chronic constipation 10/04/2010   Chronic interstitial cystitis 10/16/2011   Chronic kidney disease    kidney stones and UTI   Chronic maxillary sinusitis 09/15/2013   Chronic narcotic dependence 10/03/2018   Complication of anesthesia    either  BP or pulse dropped with last surgery   Degeneration of cervical intervertebral disc 03/18/2007   Degeneration of lumbar or lumbosacral intervertebral disc 03/18/2007   Diaphragmatic hernia 03/18/2007   Dysrhythmia    brought on by stress   Epigastric pain 11/11/2019   Esophageal dysphagia 10/03/2018   External hemorrhoids    Fibromyalgia    neuropathy knees ankles and toes, bladder   Generalized anxiety  disorder 03/18/2007   GERD (gastroesophageal reflux disease) 03/18/2007   HA (headache)-chronic/tension type 07/28/2012   Heart murmur    Hiatal hernia     History of gastric bypass 02/11/2014   1999 at [facility]   History of hyperparathyroidism 01/22/2016   History of kidney stones    Hypertension    No longer on meds   Hypoglycemia 11/08/2016   IBS (irritable bowel syndrome)    Internal hemorrhoids    Kidney stones    multiple per patient   LLQ abdominal pain 11/11/2019   Macular degeneration    Malnutrition 11/11/2019   Mechanical breakdown implanted electronic neurostimulator spinal cord 11/16/2018   Mitral regurgitation 12/11/2015   MVP (mitral valve prolapse) 12/17/2017   Myalgia 03/18/2007   Nephrolithiasis 07/28/2012   lithotrip 2013-Eskridge,MD   Neuropathic pain 01/23/2018   Orthostatic hypotension 11/20/2016   Osteoarthritis    all over   Osteopetrosis    Osteoporosis 07/28/2012   Peripheral vascular disease    superficial phlebitis in left calf   Personal history of colonic polyps 07/22/2013   07/2013 - 3 small adenomas - repeat colonoscopy 2018   PTSD (post-traumatic stress disorder)    Reactive hypoglycemia    Rosacea 11/02/2017   Small intestinal bacterial overgrowth 08/05/2017   + lactulose breath test 06/2017 Xifaxan  Rx   Type II diabetes mellitus 11/18/2016   Pt states now diagnosed with reactive hypoglycemia   Urinary tract infection     Family History  Problem Relation Age of Onset   Stroke Mother    Lung cancer Mother    Heart disease Mother    Diabetes Mother    Hypertension Father    Stroke Father    Heart disease Father    Kidney disease Father    Seizures Father        epilepsy, and sisters x 2   Memory loss Father    Lung cancer Sister    Breast cancer Sister    Cancer - Lung Sister    Heart disease Sister    Diabetes Sister    Pancreatic cancer Sister    Healthy Daughter    Healthy Son    Colon cancer Maternal Aunt        12 relatives   Colon cancer Paternal Aunt    Colon cancer Paternal Aunt    Uterine cancer Other        aunt   Heart disease Other        grandmother    Esophageal cancer Neg Hx    Rectal cancer Neg Hx    Stomach cancer Neg Hx    Colon polyps Neg Hx    Past Surgical History:  Procedure Laterality Date   ABDOMINAL HYSTERECTOMY     APPENDECTOMY     CARDIAC CATHETERIZATION  03/22/1991   EF 61%   CARDIOVASCULAR STRESS TEST  10/03/2006   CERVICAL SPINE SURGERY  02/14/2022   BLOCK   CHOLECYSTECTOMY     COLONOSCOPY  06/23/2008   normal   CYSTOSCOPY W/ URETERAL STENT REMOVAL Bilateral 03/31/2023   Procedure: CYSTOSCOPY WITH BILATERAL STENT REMOVAL;  Surgeon: Carolee Sherwood JONETTA DOUGLAS, MD;  Location: [facility];  Service: Urology;  Laterality: Bilateral;  30 MINUTE CASE   CYSTOSCOPY/URETEROSCOPY/HOLMIUM LASER/STENT PLACEMENT Bilateral 03/24/2023   Procedure: CYSTOSCOPY/BILATERAL URETEROSCOPY/HOLMIUM LASER/STENT PLACEMENT;  Surgeon: Carolee Sherwood JONETTA DOUGLAS, MD;  Location: [facility];  Service: Urology;  Laterality: Bilateral;  90 MINUTE CASE   DILATION AND CURETTAGE OF UTERUS  x2   ESOPHAGUS SURGERY     EXPLORATORY LAPAROTOMY  1993   EYE SURGERY     GASTRIC BYPASS  1999   GASTRIC RESTRICTION SURGERY  1991   LASIK Bilateral    Right Arm Surgery     Right Knee Arthroscopy     Rt wrist fx  2009   SPINAL CORD STIMULATOR IMPLANT     spinal surgery battery implant     stomach stappeling  1991   STONE EXTRACTION WITH BASKET  2012   TONSILLECTOMY     TUBAL LIGATION     UPPER GASTROINTESTINAL ENDOSCOPY  06/23/2008   w/Dil, Barrett's esophagus   US  ECHOCARDIOGRAPHY  01/21/2007   EF 55-60%   US  ECHOCARDIOGRAPHY  08/30/2003   EF 55-60%   Social History   Social History Narrative   Right handed   Two story home   She retired on disability   Married   2 Children   Immunization History  Administered Date(s) Administered   Hepatitis B, ADULT 04/21/2017   Influenza Split 10/02/2011   Influenza Whole 08/21/2007   Influenza, High Dose Seasonal PF 12/29/2017   Influenza,inj,Quad PF,6+ Mos 12/15/2013, 11/23/2014, 09/27/2015, 10/04/2016    Influenza-Unspecified 12/15/2013, 11/23/2014, 09/27/2015, 10/04/2016   PFIZER(Purple Top)SARS-COV-2 Vaccination 03/31/2019, 04/20/2019   Pneumococcal Conjugate-13 09/05/2014   Pneumococcal Polysaccharide-23 10/02/2011, 02/08/2013   Td 01/14/2006   Tdap 01/22/2016     Objective: Vital Signs: There were no vitals taken for this visit.   Physical Exam   Musculoskeletal Exam: ***  CDAI Exam: CDAI Score: -- Patient Global: --; Provider Global: -- Swollen: --; Tender: -- Joint Exam 09/10/2023   No joint exam has been documented for this visit   There is currently no information documented on the homunculus. Go to the Rheumatology activity and complete the homunculus joint exam.  Investigation: No additional findings.  Imaging: No results found.  Recent Labs: Lab Results  Component Value Date   WBC 6.9 05/15/2023   HGB 13.3 05/15/2023   PLT 339 05/15/2023   NA 140 05/15/2023   K 4.2 05/15/2023   CL 103 05/15/2023   CO2 31 05/15/2023   GLUCOSE 94 05/15/2023   BUN 26 (H) 05/15/2023   CREATININE 0.91 05/15/2023   BILITOT 0.4 05/15/2023   ALKPHOS 96 05/15/2023   AST 65 (H) 05/15/2023   ALT 65 (H) 05/15/2023   PROT 6.9 05/15/2023   ALBUMIN 4.2 05/15/2023   CALCIUM 9.6 05/15/2023   GFRAA 95 05/05/2020    Speciality Comments: DEXA [facility] Fosamax  2013-2018 Patient declined Tymlos and Forteo-travels frequently. Prolia  May 26, 2020, January 26, 2021  Procedures:  No procedures performed Allergies: Ambien [zolpidem tartrate], Codeine, Oxycodone-acetaminophen , Tylox [oxycodone-acetaminophen ], Zolpidem tartrate, Celebrex  [celecoxib ], Darvocet [propoxyphene n-acetaminophen ], Darvon [propoxyphene], Lorcet [hydrocodone -acetaminophen ], Opium, Vicodin [hydrocodone -acetaminophen ], Wheat, Amoxicillin , and Penicillins   Assessment / Plan:     Visit Diagnoses: No diagnosis found.  Orders: No orders of the defined types were placed in this encounter.  No orders of  the defined types were placed in this encounter.   Face-to-face time spent with patient was *** minutes. Greater than 50% of time was spent in counseling and coordination of care.  Follow-Up Instructions: No follow-ups on file.   Maya Nash, MD  Note - This record has been created using Animal nutritionist.  Chart creation errors have been sought, but may not always  have been located. Such creation errors do not reflect on  the standard of medical care.

## 2023-08-28 NOTE — Progress Notes (Signed)
 SURGERY FOLLOW-UP NOTE  INTERVAL HISTORY: The patient is a 78 y.o. female who is being seen for fu g tube.   History of Present Illness Kayla [name] is a 78 year old female who presents with ongoing pain and issues related to her G-tube.  She experiences persistent pain at the G-tube site since its insertion on August 03, 2023. Despite this, her weight has increased by approximately five pounds over six to eight weeks, currently at 110 pounds. There was a recent disruption in her nutritional supply, resulting in a missed feeding last night. A rush order has been placed to resolve this issue. She maintains the G-tube site by keeping it clean and dry, although she has been using the same syringe for over a month due to supply issues, ensuring hygiene by regular washing.  07/04/23 op note OPERATIVE FINDINGS: 16 Fr G tube stammed, bile presence and visualization of the balloon in the stomach confirmed accurate placement    Wt Readings from Last 10 Encounters:  08/28/23 50.2 kg (110 lb 9.6 oz)  07/31/23 48.8 kg (107 lb 9.6 oz)  07/10/23 47.6 kg (105 lb)  06/30/23 47.7 kg (105 lb 2.6 oz)  04/16/23 46.3 kg (102 lb)  03/20/23 47.7 kg (105 lb 3.2 oz)  11/05/22 52.1 kg (114 lb 13.8 oz)  09/19/22 51.7 kg (114 lb)  09/17/22 52.2 kg (115 lb)  07/30/22 55.3 kg (121 lb 14.6 oz)    CURRENT MEDICAL CONDITIONS: Patient Active Problem List  Diagnosis  . Abnormal loss of weight  . Chronic constipation  . Vitamin D  deficiency  . Peripheral polyneuropathy  . Osteoporosis  . Neuropathic pain  . Mitral regurgitation  . MVP (mitral valve prolapse)  . Memory impairment  . Malnutrition (CMS/HHS-HCC)  . Major depressive disorder, recurrent episode ()  . Macular degeneration  . Iron deficiency anemia due to dietary causes  . Generalized anxiety disorder  . Chronic narcotic dependence (CMS/HHS-HCC)  . Chronic kidney disease  . Fibromyalgia  . Calculus of kidney  . Chronic pain syndrome  . Esophageal  dysphagia  . Postsurgical malabsorption (HHS-HCC)  . History of type 2 diabetes mellitus  . History of Roux-en-Y gastric bypass  . Excessive weight loss  . Reactive hypoglycemia  . Hypoglycemia after GI (gastrointestinal) surgery (HHS-HCC)  . Adjustment disorder with depressed mood  . Barrett's esophagus  . Primary osteoarthritis of both knees  . Recurrent UTI  . Gastrostomy tube dependent (CMS/HHS-HCC)  . Severe protein-calorie malnutrition (CMS/HHS-HCC)  . Dislodged gastrostomy tube     MEDICATIONS: Current Outpatient Medications  Medication Sig Dispense Refill  . artificial tears (GENTEAL) 0.3 % ophthalmic gel Place 3 Application  into both eyes once daily as needed (dry eyes)    . BIOTIN ORAL Take 1 capsule by mouth once daily    . blood-glucose sensor (DEXCOM G6 SENSOR) Devi Use 1 each every 10 (ten) days for 90 days 9 each 3  . blood-glucose transmitter Devi Use 1 each every 3 (three) months 1 each 0  . calcium citrate-vitamin D3 (CITRACAL+D) 315 mg-5 mcg (200 unit) tablet Take 2 tablets by mouth once daily    . CALCIUM-D3-ZINC -COPPER -MANGAN ORAL Take 1 capsule by mouth once daily    . cholecalciferol (D3-5000) 5,000 unit capsule Take 5,000 Units by mouth once daily    . COLLAGEN-BIOTIN-ASCORBIC ACID ORAL Take 1 capsule by mouth once daily    . diclofenac  (VOLTAREN ) 1 % topical gel Apply 2 g topically 4 (four) times daily    .  dicyclomine  (BENTYL ) 10 mg capsule Take 10 mg by mouth every 3 (three) hours as needed (pain)    . docusate (COLACE) 100 MG capsule Take by mouth    . DULoxetine  (CYMBALTA ) 30 MG DR capsule Take 30 mg by mouth 2 (two) times daily    . famotidine  (PEPCID ) 20 MG tablet Take 20 mg by mouth once daily as needed for Heartburn    . fentaNYL  (DURAGESIC ) 25 mcg/hr patch Place 1 patch onto the skin every third day    . galcanezumab -gnlm (EMGALITY  PEN) 120 mg/mL PnIj Inject 120 mg subcutaneously monthly For migraines on the First    . glucagon  (GVOKE HYPOPEN   2-PACK) 1 mg/0.2 mL auto-injector Inject 0.2 mLs (1 mg total) subcutaneously as directed for Low blood sugar (with altered mental status and call 911) 0.4 mL 1  . HYDROcodone -acetaminophen  (NORCO) 10-325 mg tablet Take 0.5 tablets by mouth every 3 (three) hours    . iron fum/vit C/ascorbate sod (IRON PLUS VITAMIN C ORAL) Take 1 capsule by mouth once daily    . L.acidoph/L.bulg/B.bif/S.therm (PROBIOTIC ACIDOPHILUS, 4 STRN, ORAL) Take 1 capsule by mouth once daily    . latanoprost (XALATAN) 0.005 % ophthalmic solution Place 1 drop into both eyes at bedtime    . lidocaine  (LIDODERM ) 5 % patch Place 1 patch onto the skin daily    . lifitegrast (XIIDRA) 5 % ophthalmic solution Place 1 drop into both eyes 2 (two) times daily    . magnesium  hydroxide (MILK OF MAGNESIA) 400 mg/5 mL suspension Take 15 mLs by mouth at bedtime as needed for Constipation 473 mL 0  . miSOPROStoL (CYTOTEC) 200 MCG tablet Take 1 tablet (200 mcg total) by mouth 4 (four) times daily before meals and nightly for 180 days 120 tablet 5  . multivit-min-folic acid -hrb293 120 mcg- 37.5 mg Chew Take 1 capsule by mouth once daily    . mupirocin  (BACTROBAN ) 2 % ointment Apply 1 Application  topically once daily To face for Rosacea    . naloxone  0.4 mg/0.4 mL AtIn Administer 0.4mg  Morris at first sign of opioid overdose and repeat every 2 minutes as needed for resuscitation. Call 911 immediately    . omeprazole (PRILOSEC) 40 MG DR capsule Take 1 capsule (40 mg total) by mouth 2 (two) times daily before meals 60 capsule 11  . pregabalin  (LYRICA ) 50 MG capsule Take 50 mg by mouth 2 (two) times daily    . solifenacin  (VESICARE ) 5 MG tablet Take 5 mg by mouth once daily    . sucralfate  (CARAFATE ) 100 mg/mL suspension Take 10 mLs (1 g total) by mouth 4 (four) times daily before meals and nightly for 180 days 1200 mL 5  . SUMAtriptan  (IMITREX ) 100 MG tablet Take 100 mg by mouth once daily as needed for Migraine    . vibegron 75 mg Tab Take 75 mg by  mouth once daily    . blood-glucose meter,continuous (DEXCOM G7 RECEIVER) Misc Use 1 each as directed (Patient not taking: Reported on 07/23/2023) 1 each 1  . blood-glucose transmitter Devi Use 1 each every 3 (three) months (Patient not taking: Reported on 07/23/2023) 1 each 3  . butalbital -acetaminophen -caffeine  (FIORICET) 50-325-40 mg tablet Take 1 tablet by mouth every 4 (four) hours as needed for Headache 90 tablet 0  . butalbital -acetaminophen -caffeine  (FIORICET) 50-325-40 mg tablet Take 1 tablet by mouth every 4 (four) hours as needed for Pain for up to 10 days 20 tablet 0  . conjugated estrogens (PREMARIN) 0.625 mg/gram vaginal cream  Place 1 g vaginally twice a week for 180 days (Patient not taking: Reported on 08/28/2023) 30 g 0  . Herbal Supplement Take 1 capsule by mouth once daily Herbal Name: Water soluble Vitamin (Patient not taking: Reported on 07/23/2023)    . Herbal Supplement Take 1 capsule by mouth once daily Herbal Name: Gastrozen (Patient not taking: Reported on 07/23/2023)    . hydrocortisone 2.5 % cream Apply topically 2 (two) times daily Apply to left abdominal rash 30 g 0  . metroNIDAZOLE  (METROGEL ) 1 % gel Apply topically    . TENS unit and electrodes Cmpk Use     No current facility-administered medications for this visit.    ALLERGY Allergies  Allergen Reactions  . Codeine Anaphylaxis    Other reaction(s): Anaphylaxis  . Oxycodone-Acetaminophen  Anaphylaxis and Shortness Of Breath    Chest pain    . Zolpidem Anaphylaxis, Hallucination and Other (See Comments)    Other reaction(s): Other, hallucinations, Other reaction(s): Other, hallucinations, hallucinations  Other Reaction(s): Hallucination    Other reaction(s): Other, hallucinations, Other reaction(s): Other, hallucinations, hallucinations  . Ace Inhibitors Other (See Comments)  . Hydrocodone -Acetaminophen  Other (See Comments)    Severe headache Chest pain Must keep under 7-325 mg to keep in check   . Opium  Hallucination and Other (See Comments)    hallucinations  . Other Nausea And Vomiting  . Penicillins Nausea And Vomiting and Other (See Comments)    Reaction:  Migraine headache  Other reaction(s): Nausea And Vomiting, Reaction:  Migraine headache, Reaction:  Migraine headache  Migraine Headaches  . Propoxyphene N-Acetaminophen  Nausea And Vomiting    Chest pain  . Acetaminophen -Codeine Other (See Comments)  . Celecoxib  Other (See Comments)    Stomach cramping  . Doxycycline Vomiting  . Pollen Extracts Other (See Comments)  . Propoxyphene-Acetaminophen  Nausea And Vomiting and Other (See Comments)    Chest pain  . Propoxyphene Itching    Other reaction(s): Itching    PAST MEDICAL HISTORY:  Past Medical History:  Diagnosis Date  . Anorexia nervosa (CMS/HHS-HCC)   . Arthritis   . Barrett's esophagus 2014  . Cervical facet joint syndrome 2018  . Chronic pain syndrome 2018  . CKD (chronic kidney disease) 03/2021  . Diaphragmatic hernia 03/18/2007  . Dysphagia   . Esophageal dysphagia 2009  . Fibromyalgia   . GAD (generalized anxiety disorder)   . GERD (gastroesophageal reflux disease)   . H/O hyperparathyroidism   . History of kidney stones   . Hypoglycemia   . IBS (irritable bowel syndrome)   . Malnutrition (CMS/HHS-HCC)   . MDD (major depressive disorder)   . MVP (mitral valve prolapse)   . Neuropathy   . Osteoporosis   . PTSD (post-traumatic stress disorder) 2014  . Spondylosis without myelopathy or radiculopathy, cervical region 2020  . Stomach ulcer 2020    PAST SURGICAL HISTORY:  Past Surgical History:  Procedure Laterality Date  . Spintal Cord Stimulator Trial  02/13/2018  . Spinal Cord Stimulator Implant  03/20/2018  . OTHER SURGERY  11/16/2018   REVISION SPINAL CORD STIMULATOR LEAD W/ THORACIC LAMINECTOMY AND REVISION INTERNAL PULSE GENERATOR  . EGD N/A 12/18/2021   Procedure: ESOPHAGOGASTRODUODENOSCOPY, FLEXIBLE, TRANSORAL; DIAGNOSTIC, INCLUDING  COLLECTION OF SPECIMEN(S) BY BRUSHING OR WASHING, WHEN PERFORMED (SEPARATE PROCEDURE);  Surgeon: Rosalina Levorn Dragon, DO;  Location: [facility] OR;  Service: General Surgery;  Laterality: N/A;  . ESOPHAGOGASTRODOUDENOSCOPY W/BIOPSY N/A 04/16/2023   Procedure: ESOPHAGOGASTRODUODENOSCOPY, FLEXIBLE, TRANSORAL; WITH BIOPSY, SINGLE OR MULTIPLE;  Surgeon: Russella Missy Bers,  MD;  Location: [facility];  Service: Gastroenterology;  Laterality: N/A;  . COLONOSCOPY W/REMOVAL LESIONS BY SNARE N/A 05/05/2023   Procedure: COLONOSCOPY, FLEXIBLE; WITH REMOVAL OF TUMOR(S), POLYP(S), OR OTHER LESION(S) BY SNARE TECHNIQUE;  Surgeon: Rudolph Rima, MD;  Location[facility];  Service: Gastroenterology;  Laterality: N/A;  . GASTROSTOMY N/A 07/04/2023   Procedure: GASTROSTOMY, OPEN; WITHOUT CONSTRUCTION OF GASTRIC TUBE (EG, STAMM PROCEDURE) (SEPARATE PROCEDURE), possible laparoscopy;  Surgeon: Rosalina Levorn Dragon, DO;  Location: [facility] OR;  Service: General Surgery;  Laterality: N/A;  . EGD N/A 07/04/2023   Procedure: ESOPHAGOGASTRODUODENOSCOPY, FLEXIBLE, TRANSORAL; DIAGNOSTIC, INCLUDING COLLECTION OF SPECIMEN(S) BY BRUSHING OR WASHING, WHEN PERFORMED (SEPARATE PROCEDURE);  Surgeon: Rosalina Levorn Dragon, DO;  Location: [facility] OR;  Service: General Surgery;  Laterality: N/A;  . APPENDECTOMY    . bariatric surgery    . COLONOSCOPY    . DILATION AND CURETTAGE, DIAGNOSTIC / THERAPEUTIC    . EGD     Multiple per patient  . EXPLORATORY LAPAROTOMY    . HYSTERECTOMY    . KNEE ARTHROSCOPY Right   . ORIF Wrist Right    for fracture  . OTHER SURGERY     Right retinal eye surgery  . PHOTOREFRACTIVE KERATOTOMY/LASIK Bilateral    Bilateral  . TONSILLECTOMY      FAMILY HISTORY:  Family History  Problem Relation Name Age of Onset  . Lung cancer Mother    . Breast cancer Mother    . Brain cancer Mother    . High blood pressure (Hypertension) Mother    . Breast cancer Sister    . Esophageal cancer Sister    . Lung cancer  Sister    . Pancreatic cancer Sister    . Coronary Artery Disease (Blocked arteries around heart) Sister    . Diabetes type II Sister      SOCIAL HISTORY:  Social History   Socioeconomic History  . Marital status: Married  Tobacco Use  . Smoking status: Never  . Smokeless tobacco: Never  Vaping Use  . Vaping status: Never Used  Substance and Sexual Activity  . Alcohol  use: Not Currently    Comment: stopped  . Drug use: Never  . Sexual activity: Not Currently    Partners: Male    Birth control/protection: Post-menopausal    Comment: intimate with husband but no sexual intercourse   Social Drivers of Corporate investment banker Strain: Low Risk  (07/23/2023)   Overall Financial Resource Strain (CARDIA)   . Difficulty of Paying Living Expenses: Not hard at all  Food Insecurity: No Food Insecurity (07/23/2023)   Hunger Vital Sign   . Worried About Programme researcher, broadcasting/film/video in the Last Year: Never true   . Ran Out of Food in the Last Year: Never true  Transportation Needs: No Transportation Needs (07/23/2023)   PRAPARE - Transportation   . Lack of Transportation (Medical): No   . Lack of Transportation (Non-Medical): No  Recent Concern: Transportation Needs - Unmet Transportation Needs (07/04/2023)   PRAPARE - Transportation   . Lack of Transportation (Medical): Yes   . Lack of Transportation (Non-Medical): No  Physical Activity: Inactive (11/22/2022)   Exercise Vital Sign   . Days of Exercise per Week: 0 days   . Minutes of Exercise per Session: 0 min  Stress: Stress Concern Present (11/22/2022)   [employer] of Occupational Health - Occupational Stress Questionnaire   . Feeling of Stress : Very much  Social Connections: Moderately Integrated (11/22/2022)   Social  Connection and Isolation Panel   . Frequency of Communication with Friends and Family: More than three times a week   . Frequency of Social Gatherings with Friends and Family: Once a week   . Attends Religious  Services: More than 4 times per year   . Active Member of Clubs or Organizations: No   . Attends Banker Meetings: Never   . Marital Status: Married  Housing Stability: Low Risk  (08/26/2023)   Housing Stability Vital Sign   . Unable to Pay for Housing in the Last Year: No   . Number of Times Moved in the Last Year: 0   . Homeless in the Last Year: No  Recent Concern: Housing Stability - High Risk (07/04/2023)   Housing Stability Vital Sign   . Unable to Pay for Housing in the Last Year: Yes   . Number of Times Moved in the Last Year: 0   . Homeless in the Last Year: No     REVIEW OF SYSTEMS:  GENERAL: Denies fevers, chills or malaise. SKIN: Denies rashes, sores. HEAD: Denies headache, migraine. EYES: Denies vision change, diploplia. RESPIRATORY: Denies cough, sputum. CARDIAC: Denies chest pain, palpitations, dyspnea, orthopnea. GI: Denies abdominal pain, changes in bowel habits.   URINARY: Denies polyuria, dysuria, nocturia, hesitancy, incontinence, infections. MS: Denies new back pain or joint pain.   PHYSICAL EXAMINATION:  Ht:165.1 cm (5' 5) Wt:50.2 kg (110 lb 9.6 oz) ADJ:Anib surface area is 1.52 meters squared. Body mass index is 18.4 kg/m.   BP 101/57 (BP Location: Left upper arm, Patient Position: Sitting, BP Cuff Size: Adult)   Pulse 86   Ht 165.1 cm (5' 5)   Wt 50.2 kg (110 lb 9.6 oz)   LMP  (LMP Unknown)   SpO2 97%   BMI 18.40 kg/m   GENERAL: No acute distress NEURO: Alert and oriented x3, mood and affect appropriate. LUNGS: good air entry SKIN: Smooth and dry. ABDOMEN: g tube in place. Bowel sounds normal. Soft, non-tender, no masses. EXTREMITIES:  No venous stasis changes.  Results    Last egd 04/2023   IMPRESSION:  Percutaneous gastrostomy balloon overlies the left upper quadrant, noting that the balloon appears outside the gastric lumen on the AP view. However, this is likely projectional since the balloon appears within the lumen  on the lateral view. Enteric contrast is seen within the stomach, no evidence of contrast extravasation.   Electronically Signed by:  Anil Nagavalli, DO, [facility] Electronically Signed on:  08/26/2023 7:34 PM  Colonoscopy 05/05/23   Problem List Items Addressed This Visit       Endocrine   Hypoglycemia after GI (gastrointestinal) surgery (HHS-HCC)     ENT   Esophageal dysphagia     FEN/GI   Malnutrition (CMS/HHS-HCC)   Postsurgical malabsorption (HHS-HCC) - Primary   History of Roux-en-Y gastric bypass   Severe protein-calorie malnutrition (CMS/HHS-HCC)   Other Visit Diagnoses       Migraine without status migrainosus, not intractable, unspecified migraine type       Relevant Medications   butalbital -acetaminophen -caffeine  (FIORICET) 50-325-40 mg tablet        Assessment & Plan Gastrostomy tube pain and drainage   Persistent pain and drainage around the gastrostomy tube site are present, with granulation tissue contributing to the drainage. She declined in-office cautery of the granulation tissue due to pain concerns. Advise keeping the tube site dry by changing gauze frequently. Schedule granulation tissue cautery for next month if she consents.  Malnutrition  due to inadequate enteral nutrition   Malnutrition is due to inadequate enteral nutrition, worsened by nutrient supply delivery issues. Weight gain is occurring, but she reports pain associated with the gastrostomy tube. The goal is to improve nutritional status to facilitate recovery from planned surgery. Ensure nutrient supply issues are resolved. Monitor weight and nutritional labs, including albumin and protein levels. Advise use of magnesium  citrate for constipation management.  Planned open abdominal surgery for reversal of RYGB  Surgery is planned to improve nutritional absorption and overall health. She is currently underweight, with a goal to reach 120 pounds with improved protein levels before surgery.  The surgery is anticipated to be complex due to previous surgeries and anatomical considerations, but she is otherwise healthy and functional, which should aid recovery. Surgery is planned for January, contingent on reaching weight and nutritional goals, but may be scheduled earlier if nutritional goals are met. The procedure is expected to take approximately five hours, with a plan for an eight-hour case to accommodate potential complexities. Monitor weight and protein levels closely. Maintain gastrostomy tube until surgery.  Plan for open reversal in January if her weight is 120lbs, albumin >3.5 and prealbumin >20. Needs iron for hgb 10  I personally performed the service.   KERI ANNE SEYMOUR, DO

## 2023-08-29 NOTE — Progress Notes (Signed)
 [facility] Care [facility] - Rescheduled Appointment  Keya Wynes was contacted due to a missed appointment; new appointment scheduled for patient requested call back on Monday to reschedule.    PAULA TAH-SHERMAN    [address], [address]; [place], [place] [address] l  [email or web address] l [phone]   For more information on [facility] services, click here.

## 2023-09-02 ENCOUNTER — Ambulatory Visit: Admitting: Rheumatology

## 2023-09-02 DIAGNOSIS — R5383 Other fatigue: Secondary | ICD-10-CM

## 2023-09-02 DIAGNOSIS — F32A Depression, unspecified: Secondary | ICD-10-CM

## 2023-09-02 DIAGNOSIS — M47816 Spondylosis without myelopathy or radiculopathy, lumbar region: Secondary | ICD-10-CM

## 2023-09-02 DIAGNOSIS — M810 Age-related osteoporosis without current pathological fracture: Secondary | ICD-10-CM

## 2023-09-02 DIAGNOSIS — M62838 Other muscle spasm: Secondary | ICD-10-CM

## 2023-09-02 DIAGNOSIS — N3010 Interstitial cystitis (chronic) without hematuria: Secondary | ICD-10-CM

## 2023-09-02 DIAGNOSIS — G894 Chronic pain syndrome: Secondary | ICD-10-CM

## 2023-09-02 DIAGNOSIS — Z79899 Other long term (current) drug therapy: Secondary | ICD-10-CM

## 2023-09-02 DIAGNOSIS — N200 Calculus of kidney: Secondary | ICD-10-CM

## 2023-09-02 DIAGNOSIS — R252 Cramp and spasm: Secondary | ICD-10-CM

## 2023-09-02 DIAGNOSIS — E559 Vitamin D deficiency, unspecified: Secondary | ICD-10-CM

## 2023-09-02 DIAGNOSIS — M5030 Other cervical disc degeneration, unspecified cervical region: Secondary | ICD-10-CM

## 2023-09-02 DIAGNOSIS — Z8719 Personal history of other diseases of the digestive system: Secondary | ICD-10-CM

## 2023-09-02 DIAGNOSIS — M797 Fibromyalgia: Secondary | ICD-10-CM

## 2023-09-02 DIAGNOSIS — M7061 Trochanteric bursitis, right hip: Secondary | ICD-10-CM

## 2023-09-02 DIAGNOSIS — E162 Hypoglycemia, unspecified: Secondary | ICD-10-CM

## 2023-09-03 ENCOUNTER — Emergency Department (HOSPITAL_COMMUNITY)

## 2023-09-03 ENCOUNTER — Emergency Department (HOSPITAL_COMMUNITY)
Admission: EM | Admit: 2023-09-03 | Discharge: 2023-09-03 | Disposition: A | Discharge status: Home / Self Care | Attending: Emergency Medicine | Admitting: Emergency Medicine

## 2023-09-03 ENCOUNTER — Other Ambulatory Visit: Payer: Self-pay

## 2023-09-03 DIAGNOSIS — N189 Chronic kidney disease, unspecified: Secondary | ICD-10-CM | POA: Insufficient documentation

## 2023-09-03 DIAGNOSIS — T85598A Other mechanical complication of other gastrointestinal prosthetic devices, implants and grafts, initial encounter: Secondary | ICD-10-CM | POA: Insufficient documentation

## 2023-09-03 DIAGNOSIS — E1122 Type 2 diabetes mellitus with diabetic chronic kidney disease: Secondary | ICD-10-CM | POA: Insufficient documentation

## 2023-09-03 DIAGNOSIS — T85528A Displacement of other gastrointestinal prosthetic devices, implants and grafts, initial encounter: Secondary | ICD-10-CM

## 2023-09-03 MED ORDER — HYDROMORPHONE HCL 1 MG/ML IJ SOLN
1.0000 mg | Freq: Once | INTRAMUSCULAR | Status: AC
  Administered 2023-09-03: 1 mg via INTRAMUSCULAR
  Filled 2023-09-03: qty 1

## 2023-09-03 MED ORDER — IOHEXOL 300 MG/ML  SOLN
30.0000 mL | Freq: Once | INTRAMUSCULAR | Status: AC | PRN
  Administered 2023-09-03: 30 mL

## 2023-09-03 NOTE — ED Provider Notes (Signed)
 [facility] AT [facility] Provider Note   CSN: [id number] Arrival date & time: 09/03/23  1403     Patient presents with: Abdominal Pain   Kayla [name] is a 78 y.o. female with past medical history of migraine, fibromyalgia, T2DM, CKD, IDA, GERD, Roux-en-Y gastric bypass (1999) presents to Emergency Department via EMS for evaluation of G-tube dislodgment.  Reports that she was resting at home when she heard a pop then her tube dislodged and she felt a large amount of abdominal contents, likely her nutrition shakes, spilled out onto the ground.  She reports concern regarding developing peritonitis although she has not had a history of this.  G-tube was placed at [facility] by Dr. Rosalina 1 week ago.  Did not go to [facility] as she was worried about the large amount of contents that spilled out and 1 hour drive to [facility] from her home.  Currently, complains of left-sided abdominal pain that she felt prior to G-tube placement that has not changed since G-tube coming out.  Denies fevers.  Of note, patient was admitted in June 2025 for G-tube site wound infection and underwent I&D at [facility]    Abdominal Pain      Prior to Admission medications   Medication Sig Start Date End Date Taking? Authorizing Provider  aspirin-acetaminophen -caffeine  (EXCEDRIN MIGRAINE) 250-250-65 MG tablet Take 1 tablet by mouth in the morning, at noon, in the evening, and at bedtime.    [provider]  Baclofen  5 MG TABS Take 1 tablet (5 mg total) by mouth 3 (three) times daily as needed. 02/19/23   Skeet Juliene SAUNDERS, DO  Biotin 1000 MCG CHEW Chew 1,000 mcg by mouth daily.    [provider]  COLLAGEN PO Take 1 each by mouth daily.    [provider]  Continuous Blood Gluc Receiver (DEXCOM G7 RECEIVER) DEVI by Does not apply route.    [provider]  cycloSPORINE  (RESTASIS ) 0.05 % ophthalmic emulsion Place 1 drop into both eyes 2 (two) times daily.  11/08/16   Loreli Elyn SAILOR, MD  diclofenac  Sodium (VOLTAREN ) 1 % GEL Apply 2 to 4 gram to the affected area(s) (painful joints) up to 4 times daily as needed. 08/08/22     dicyclomine  (BENTYL ) 10 MG capsule Take 1-2 tab 3 times daily AC as needed for spasms and cramping. 11/10/19   Zehr, Jessica D, PA-C  Docusate Sodium  (DSS) 100 MG CAPS Take 100 mg by mouth daily.    [provider]  DULoxetine  (CYMBALTA ) 30 MG capsule Take 1 capsule (30 mg total) by mouth 2 (two) times daily. 08/26/23   Skeet Juliene SAUNDERS, DO  famotidine  (PEPCID ) 20 MG tablet Take 20 mg by mouth daily as needed for heartburn or indigestion.    [provider]  fentaNYL  (DURAGESIC ) 25 MCG/HR Place 1 patch onto the skin every 3 (three) days (every 72 hours). 07/22/23     fentaNYL  (DURAGESIC ) 25 MCG/HR Place 1 patch onto the skin every three (3) days as needed. 07/22/23     Galcanezumab -gnlm (EMGALITY ) 120 MG/ML SOAJ Inject 120 mg into the skin every 28 (twenty-eight) days. 02/19/23   Skeet, Adam R, DO  glucagon  1 MG injection Inject 1 mg into the muscle once as needed for up to 1 dose. 05/12/19   Kassie Mallick, MD  HYDROcodone -acetaminophen  (NORCO) 10-325 MG tablet Take 0.5-1 tablets by mouth 4 (four) times daily, if needed for pain (max daily dose: 4 tablets) 07/22/23  hydrocortisone cream 1 % Apply 1 Application topically daily as needed for itching.    [provider]  HYDROmorphone  (DILAUDID ) 2 MG tablet Take 1 tablet (2 mg total) by mouth 3 (three) times daily as needed for abdominal pain. Patient not taking: Reported on 08/26/2023 07/22/23     hypromellose (GENTEAL) 0.3 % GEL ophthalmic ointment Place 1 drop into both eyes at bedtime. Reported on 02/11/2015    [provider]  latanoprost (XALATAN) 0.005 % ophthalmic solution Place 1 drop into both eyes at bedtime.    [provider]  lidocaine  (LIDODERM ) 5 % Place 1 patch onto the skin as needed. Remove & Discard patch within 12 hours. May dispense as 3  month supply 10/20/18   Stallings, Zoe A, MD  magnesium  hydroxide (MILK OF MAGNESIA) 400 MG/5ML suspension Take 5 mLs by mouth daily as needed for moderate constipation. 10/30/21   [provider]  metroNIDAZOLE  (METROGEL ) 1 % gel Apply topically daily. 10/20/18   Stallings, Zoe A, MD  mirabegron  ER (MYRBETRIQ ) 25 MG TB24 tablet Take 1 tablet (25 mg total) by mouth daily. 10/20/18   Stallings, Zoe A, MD  naloxegol  oxalate (MOVANTIK ) 25 MG TABS tablet Take 25 mg by mouth daily.    [provider]  ondansetron  (ZOFRAN ) 8 MG tablet Take 1 tablet (8 mg total) by mouth every 8 (eight) hours as needed for nausea or vomiting. 10/20/18   Bettie Santana LABOR, MD  OVER THE COUNTER MEDICATION Take 1 Scoop by mouth daily. Copper , Selenium and Zinc  Powder    [provider]  pregabalin  (LYRICA ) 50 MG capsule Take 1 capsule in morning and 2 capsules at night 08/26/23   Skeet Juliene SAUNDERS, DO  Probiotic Product (PROBIOTIC-10 PO) Take 1 capsule by mouth daily.    [provider]  rizatriptan  (MAXALT -MLT) 10 MG disintegrating tablet Take 1 tablet (10 mg total) by mouth as needed for migraine. May repeat in 2 hours if needed 08/22/23   Skeet Juliene SAUNDERS, DO  solifenacin  (VESICARE ) 5 MG tablet Take 5 mg by mouth daily. 08/15/22   [provider]  sucralfate  (CARAFATE ) 1 GM/10ML suspension Take 10 mLs (1 g total) by mouth 4 (four) times daily. Patient taking differently: Take 1 g by mouth daily as needed (acid reflux). 11/10/19   Zehr, Jessica D, PA-C    Allergies: Ambien [zolpidem tartrate], Codeine, Oxycodone-acetaminophen , Tylox [oxycodone-acetaminophen ], Zolpidem tartrate, Celebrex  [celecoxib ], Darvocet [propoxyphene n-acetaminophen ], Darvon [propoxyphene], Lorcet [hydrocodone -acetaminophen ], Opium, Vicodin [hydrocodone -acetaminophen ], Wheat, Amoxicillin , and Penicillins    Review of Systems  Gastrointestinal:  Positive for abdominal pain.    Updated Vital Signs BP (!) 115/53 (BP  Location: Right Arm)   Pulse 84   Temp 98.3 F (36.8 C) (Oral)   Resp 16   Wt 52.2 kg   SpO2 100%   BMI 18.57 kg/m   Physical Exam Vitals and nursing note reviewed.  Constitutional:      General: She is not in acute distress.    Appearance: Normal appearance.  HENT:     Head: Normocephalic and atraumatic.  Eyes:     Conjunctiva/sclera: Conjunctivae normal.  Cardiovascular:     Rate and Rhythm: Normal rate.  Pulmonary:     Effort: Pulmonary effort is normal. No respiratory distress.  Abdominal:     General: A surgical scar is present.     Tenderness: There is generalized abdominal tenderness. There is no right CVA tenderness, left CVA tenderness, guarding or rebound.     Comments: Mild generalized  tenderness with deep palpation.  Nonsurgical abdomen with no peritoneal signs. Stoma to LUQ noninfecitous appearing with no drainage. See media  Skin:    Coloration: Skin is not jaundiced or pale.  Neurological:     Mental Status: She is alert. Mental status is at baseline.     (all labs ordered are listed, but only abnormal results are displayed) Labs Reviewed - No data to display  EKG: None  Radiology: No results found.   .Gastrostomy tube replacement  Date/Time: 09/03/2023 6:53 PM  Performed by: Minnie Tinnie BRAVO, PA Authorized by: Minnie Tinnie BRAVO, PA  Consent: Verbal consent obtained Consent given by: patient Patient understanding: patient states understanding of the procedure being performed Patient consent: the patient's understanding of the procedure matches consent given Patient identity confirmed: verbally with patient and hospital-assigned identification number Preparation: Patient was prepped and draped in the usual sterile fashion. Patient tolerance: patient tolerated the procedure well with no immediate complications      Medications Ordered in the ED  HYDROmorphone  (DILAUDID ) injection 1 mg (1 mg Intramuscular Given 09/03/23 1747)                                     Medical Decision Making Amount and/or Complexity of Data Reviewed Radiology: ordered.  Risk Prescription drug management.   Patient presents to the ED for concern of G-tube dislodgment, this involves an extensive number of treatment options, and is a complaint that carries with it a high risk of complications and morbidity.  The differential diagnosis includes dislodgment, infection, peritonitis   Co morbidities that complicate the patient evaluation  PMX of Roux-en-Y bypass, G-tube placement   Additional history obtained:  Additional history obtained from Nursing and Outside Medical Records   External records from outside source obtained and reviewed including triage note, bariatric surgery note from 08/28/2023    Imaging Studies ordered:  I ordered imaging studies including abdominal PEG tube location x-ray I independently visualized and interpreted imaging which showed  G-tube balloon projects over the stomach. No evidence of extraluminal contrast on single frontal view. I agree with the radiologist interpretation     Medicines ordered and prescription drug management:  I ordered medication including dilaudid   for pain  Reevaluation of the patient after these medicines showed that the patient improved I have reviewed the patients home medicines and have made adjustments as needed     Problem List / ED Course:  G tube dislodgement No signs of surrounding infection, cellulitis, purulent drainage around G-tube site No signs of peritonitis with no guarding nor rigidity Had 16 French placed previously however attempted to place a 41 [place] without success.  Use 14 [place] and successfully placed.  See procedure note Confirm placement of G-tube location with x-ray with contrast Has follow-up appointment next week to follow-up with bariatric surgery regarding G tube   Reevaluation:  After the interventions noted above, I reevaluated the patient and  found that they have :resolved    Dispostion:  After consideration of the diagnostic results and the patients response to treatment, I feel that the patent would benefit from outpatient management with follow-up.   Discussed ED workup, disposition, return to ED precautions with patient who expresses understanding agrees with plan.  All questions answered to their satisfaction.  They are agreeable to plan.  Discharge instructions provided on paperwork  Final diagnoses:  Dislodged gastrostomy tube    ED Discharge  Orders     None        Minnie Tinnie BRAVO, PA 09/03/23 1929    Elnor Bernarda SQUIBB, DO 09/04/23 1505

## 2023-09-03 NOTE — Discharge Instructions (Addendum)
 Thank for letting us  evaluate you today.  Your tube appears to be in normal placement.  Please follow-up with your follow-up appointment next week.  Return to Emergency Department if you experience purulent drainage from stoma, dislodged tube

## 2023-09-03 NOTE — ED Triage Notes (Signed)
 Pt arrives via EMS from home. PT reports that her Gtube popped out. Pt states that as the tube dislodged, she felt a large amount of abdominal contents spill out, and is worried that she may develop peritonitis. Pt states the tube was placed at [facility], and is approx 54 week old.   Pt uses a walker or wheelchair due to neuropathy.   Pt is wearing a 25mcg/hr Fentanyl  patch on arrival that was placed yesterday and is changed every 3 days.

## 2023-09-04 NOTE — ED Provider Notes (Signed)
.  Gastrostomy tube replacement  Date/Time: 09/04/2023 3:03 PM  Performed by: Elnor Bernarda SQUIBB, DO Authorized by: Elnor Bernarda SQUIBB, DO  Consent: Verbal consent obtained Consent given by: patient Patient understanding: patient states understanding of the procedure being performed Patient identity confirmed: verbally with patient, hospital-assigned identification number and arm band Time out: Immediately prior to procedure a time out was called to verify the correct patient, procedure, equipment, support staff and site/side marked as required. Preparation: Patient was prepped and draped in the usual sterile fashion.  Sedation: Patient sedated: yes Analgesia: hydromorphone   Patient tolerance: patient tolerated the procedure well with no immediate complications Comments: Successful placement of G-tube as confirmed on xray series for gastrostomy tubes         Elnor Bernarda SQUIBB, DO 09/04/23 1505

## 2023-09-09 ENCOUNTER — Other Ambulatory Visit (HOSPITAL_COMMUNITY): Payer: Self-pay | Admitting: Physician Assistant

## 2023-09-09 DIAGNOSIS — M542 Cervicalgia: Secondary | ICD-10-CM

## 2023-09-10 ENCOUNTER — Ambulatory Visit: Admitting: Rheumatology

## 2023-09-10 DIAGNOSIS — G894 Chronic pain syndrome: Secondary | ICD-10-CM

## 2023-09-10 DIAGNOSIS — E162 Hypoglycemia, unspecified: Secondary | ICD-10-CM

## 2023-09-10 DIAGNOSIS — M810 Age-related osteoporosis without current pathological fracture: Secondary | ICD-10-CM

## 2023-09-10 DIAGNOSIS — R252 Cramp and spasm: Secondary | ICD-10-CM

## 2023-09-10 DIAGNOSIS — M797 Fibromyalgia: Secondary | ICD-10-CM

## 2023-09-10 DIAGNOSIS — N200 Calculus of kidney: Secondary | ICD-10-CM

## 2023-09-10 DIAGNOSIS — R5383 Other fatigue: Secondary | ICD-10-CM

## 2023-09-10 DIAGNOSIS — F32A Depression, unspecified: Secondary | ICD-10-CM

## 2023-09-10 DIAGNOSIS — N3010 Interstitial cystitis (chronic) without hematuria: Secondary | ICD-10-CM

## 2023-09-10 DIAGNOSIS — M5030 Other cervical disc degeneration, unspecified cervical region: Secondary | ICD-10-CM

## 2023-09-10 DIAGNOSIS — M62838 Other muscle spasm: Secondary | ICD-10-CM

## 2023-09-10 DIAGNOSIS — M7061 Trochanteric bursitis, right hip: Secondary | ICD-10-CM

## 2023-09-10 DIAGNOSIS — F419 Anxiety disorder, unspecified: Secondary | ICD-10-CM

## 2023-09-10 DIAGNOSIS — Z79899 Other long term (current) drug therapy: Secondary | ICD-10-CM

## 2023-09-10 DIAGNOSIS — E559 Vitamin D deficiency, unspecified: Secondary | ICD-10-CM

## 2023-09-10 DIAGNOSIS — M47816 Spondylosis without myelopathy or radiculopathy, lumbar region: Secondary | ICD-10-CM

## 2023-09-10 DIAGNOSIS — Z8719 Personal history of other diseases of the digestive system: Secondary | ICD-10-CM

## 2023-09-18 ENCOUNTER — Other Ambulatory Visit (HOSPITAL_COMMUNITY): Payer: Self-pay

## 2023-09-18 ENCOUNTER — Telehealth: Payer: Self-pay | Admitting: Pharmacy Technician

## 2023-09-19 ENCOUNTER — Other Ambulatory Visit (HOSPITAL_COMMUNITY): Payer: Self-pay

## 2023-09-19 NOTE — Telephone Encounter (Signed)
 Pharmacy Patient Advocate Encounter   Received notification from Pt Calls Messages that prior authorization for BOTOX 200 is required/requested.   Insurance verification completed.   The patient is insured through [facility] .   Per test claim: PA required; PA submitted to above mentioned insurance via Fax Key/confirmation #/EOC [id number] Status is pending  CPT: 64615 DX: G43.709

## 2023-09-19 NOTE — Telephone Encounter (Signed)
 PA has been submitted, and telephone encounter has been created. Please see telephone encounter dated 9.5.25.

## 2023-09-23 ENCOUNTER — Other Ambulatory Visit (HOSPITAL_BASED_OUTPATIENT_CLINIC_OR_DEPARTMENT_OTHER): Payer: Self-pay

## 2023-09-23 ENCOUNTER — Telehealth: Payer: Self-pay | Admitting: Hematology and Oncology

## 2023-09-23 MED ORDER — FENTANYL 25 MCG/HR TD PT72
1.0000 | MEDICATED_PATCH | TRANSDERMAL | 0 refills | Status: DC
  Filled 2023-09-24: qty 10, 30d supply, fill #0

## 2023-09-23 MED ORDER — HYDROCODONE-ACETAMINOPHEN 10-325 MG PO TABS
0.5000 | ORAL_TABLET | Freq: Four times a day (QID) | ORAL | 0 refills | Status: AC | PRN
  Filled 2023-09-24: qty 120, 30d supply, fill #0

## 2023-09-24 ENCOUNTER — Other Ambulatory Visit (HOSPITAL_BASED_OUTPATIENT_CLINIC_OR_DEPARTMENT_OTHER): Payer: Self-pay

## 2023-10-03 ENCOUNTER — Ambulatory Visit: Admitting: Neurology

## 2023-10-10 ENCOUNTER — Ambulatory Visit (HOSPITAL_COMMUNITY)

## 2023-10-11 ENCOUNTER — Ambulatory Visit (HOSPITAL_COMMUNITY)
Admission: RE | Admit: 2023-10-11 | Discharge: 2023-10-11 | Disposition: A | Discharge status: Home / Self Care | Source: Ambulatory Visit | Attending: Physician Assistant | Admitting: Physician Assistant

## 2023-10-11 DIAGNOSIS — M542 Cervicalgia: Secondary | ICD-10-CM | POA: Diagnosis present

## 2023-10-16 ENCOUNTER — Other Ambulatory Visit (HOSPITAL_COMMUNITY): Payer: Self-pay

## 2023-10-16 ENCOUNTER — Other Ambulatory Visit

## 2023-10-16 ENCOUNTER — Telehealth: Payer: Self-pay | Admitting: Neurology

## 2023-10-16 ENCOUNTER — Ambulatory Visit: Admitting: Hematology and Oncology

## 2023-10-16 MED ORDER — RIZATRIPTAN BENZOATE 10 MG PO TBDP: 10.0000 mg | ORAL_TABLET | ORAL | 3 refills | Status: AC | PRN

## 2023-10-16 MED ORDER — RIZATRIPTAN BENZOATE 10 MG PO TBDP: 10.0000 mg | ORAL_TABLET | ORAL | 0 refills | Status: AC | PRN

## 2023-10-16 NOTE — Telephone Encounter (Signed)
 Pt is calling for MRI results and to have Migraine Rx refilled at [facility]

## 2023-10-16 NOTE — Telephone Encounter (Signed)
 Patient had MRI ordered by her PT. Patient wanted to see if Dr.Jaffe could read it.    Refills sent in for Rizatriptan . Per Patient one send to [facility] to get right now and the standing order to [facility].

## 2023-10-16 NOTE — Telephone Encounter (Signed)
 This should have resulted by now, will call to check status in the morning 10.3.25

## 2023-10-16 NOTE — Telephone Encounter (Signed)
 Patient wants to have status of Botox

## 2023-10-17 ENCOUNTER — Other Ambulatory Visit (HOSPITAL_COMMUNITY): Payer: Self-pay

## 2023-10-17 NOTE — Telephone Encounter (Signed)
 Received call back from the [place]. The turnaround time for decision is 5-6 weeks from submission. This PA was submitted on 9.5.25. Rep put in an urgent request and took my number for them to call back directly with an update. Turnaround time for call back is 5-7 business days per rep.

## 2023-10-17 NOTE — Telephone Encounter (Signed)
 Patient advised of Dr.Jaffe, I reviewed the MRI of the cervical spine. It shows arthritis, which could be cause for neck pain, but no significant pinching of the nerves.

## 2023-10-17 NOTE — Telephone Encounter (Signed)
 Patient advised.

## 2023-10-21 ENCOUNTER — Ambulatory Visit: Admitting: Hematology and Oncology

## 2023-10-21 ENCOUNTER — Other Ambulatory Visit

## 2023-10-21 ENCOUNTER — Encounter: Admitting: Neurology

## 2023-10-21 NOTE — Telephone Encounter (Signed)
 LMOVM for patient with update.

## 2023-10-23 ENCOUNTER — Other Ambulatory Visit (HOSPITAL_BASED_OUTPATIENT_CLINIC_OR_DEPARTMENT_OTHER): Payer: Self-pay

## 2023-10-23 MED ORDER — FENTANYL 25 MCG/HR TD PT72
1.0000 | MEDICATED_PATCH | TRANSDERMAL | 0 refills | Status: AC
  Filled 2023-10-24: qty 10, 30d supply, fill #0

## 2023-10-23 MED ORDER — HYDROCODONE-ACETAMINOPHEN 10-325 MG PO TABS
0.5000 | ORAL_TABLET | Freq: Four times a day (QID) | ORAL | 0 refills | Status: AC
  Filled 2023-10-24: qty 120, 30d supply, fill #0

## 2023-10-24 ENCOUNTER — Other Ambulatory Visit (HOSPITAL_BASED_OUTPATIENT_CLINIC_OR_DEPARTMENT_OTHER): Payer: Self-pay

## 2023-10-29 ENCOUNTER — Other Ambulatory Visit (HOSPITAL_BASED_OUTPATIENT_CLINIC_OR_DEPARTMENT_OTHER): Payer: Self-pay

## 2023-10-30 NOTE — Telephone Encounter (Addendum)
 Call with [facility] on [facility] stated that because pt has Medicare Part A/B as Primary, their [facility] would be Secondary and would pick up the remainig form Medicare Part A/B. This would not be a pharmacy benefit, but rather a buy/bill benefit.

## 2023-10-31 NOTE — Telephone Encounter (Signed)
 Tried calling patient no answer, Per Dr.Jaffe,  We are unable to accommodate buy and bill, so unfortunately Botox won't be an option. The other possibility is changing Emgality  to an alternative similar medication. We can try a daily pill called Qulipta, or an infusion every 3 months called Vyepti. Both are good medications.

## 2023-11-10 NOTE — Progress Notes (Signed)
 Case Management Discharge Planning Note  Barriers identified?  Adult Barriers: Primary Need: Facility Barriers Will patient be medically ready for discharge within next 48 hours?: No Next steps: Complex surgical pt; now re-intubated on pressors . Awaiting care team recs and PT OT eval for definitive DC plans.  EDD: 11/14/2023  Preferred DC location: [facility], SNF (new)  Medical Readiness for Discharge:  Reassessment Documentation Goals prior to discharge: Reintubated, on Levo, lidocaine  gtt, ketamine, TPN; CT out, 1 JP, osotomy Mobility: other (comment) (TBD when stable) Anticipated care needs at discharge include: Wounds, Lines/Drains/Airway, O2 Needs, Tube Feeds/TPN Anticipated care needs comment: Likely [facility] Preferred Discharge Location: [facility], SNF (new)  Team recommendations and collaboration for discharge planning LT[facility], SNF  Pt / Patient representative conversation and collaboration for discharge planning Providers updating spouse Patient and/or their representative have participated in and are in agreement with discharge plan. TBD  CM Next Steps: Follow for medical stability Follow for therapy recommendations as needed and make referrals to appropriate levels of care DC needs are likely but unclear; will continue to monitor closely with reassessments Unable to determine due to medical instability, will continue to reassess  Coordinated Resources:  Dialysis/Infusion - Admitted Since 10/27/2023     Service Provider Services Address Phone Fax Patient Preferred   [facility]  [facility] [address], [place] [address] [phone] --      Prior TF infusion services from [facility].   Pt will need new orders if indicated at DC  Alyssa Rodriguez-Finch, BSN, RN Case Management [facility] [phone]

## 2023-11-10 NOTE — Procedures (Signed)
 Percutaneous Arterial Line Insertion Procedure Note 11/10/2023 5:59 PM Consent obtained: yes Time-out (including correct patient, side, site, procedure, position, equipment): yes Status of Procedure: urgent Indication:pressure monitoring Prep:  Operator masked, gowned, and gloved in sterile fashion. Insertion site prepped with chlorhexidine  Anesthesia/Analgesia: Lidocaine  1% 2 ml Location: right radial artery Line type:  Arterial line 20 gauge  Technique: new puncture Ultrasound used: yes Immediate Complications: none Attending present: no PRENTICE NORLEEN SAILS, MD   ------------------------------------------------------------------------------- Attestation signed by Leroy Market, MD at 11/11/2023  7:08 AM I was available during the procedure.  I have reviewed and agree with the procedural note as documented above with any exceptions noted.  Market Leroy, MD MPH  -------------------------------------------------------------------------------

## 2023-11-10 NOTE — Progress Notes (Signed)
 Infectious Diseases Consultation Note  Date of Admission: 10/27/2023 Service Requesting: Intensive Care Hospital Day: 15 with principal problem of Severe protein-calorie malnutrition (HHS-HCC)  Identifying Info:  Kayla [name] is 78 y.o. female with medical problems significant for DDD, chronic back pain s/p spinal stimulator, Barrett's esophagus s/p Roux-en-Y 1999 c/b malabsorption, mitral valve prolapse, esophageal dysphagia requiring periodic dilation, osteoporosis, recurrent UTIs, DM2 who was admitted on 10/27/2023. ID is consulted for bacteremia.   Subjective  HPI/Interval Updates:  6/20-6/27/25 Admitted for PEG placement, TF initiated 6/30-07/19/23 Abdominal wall abscess requiring debridement, no cultures obtained, discharged on cipro  and metronidazole .  Current admission: 10/27/23 OR for planned reversal of her Roux-en-Y. Dense adhesions found throughout the abdomen, gastrostomy tube removed, jejunostomy tube placed into the roux limb, NGT placed distal to GG anastomosis, drains left in place.  10/14  PICC placed for TPN initiation. Worsening tachycardia, change in NGT output from dark brown to dark red. CTAP demonstrated pneumatosis of small bowel with portal vein gas. 10/15  Tachyardia, hypotension, lactic acidosis. OR for ex-lap: dusky appearing stomach, small bowel and colon, no overt necrosis. Palpable SMA pulse. Bilious leak from previously cauterized liver edge, 5cm R anterior diaphragmatic defect from dense adhesions to liver, EGD with dusky stomach without apparent leak. Abdomen left open for second look. Admitted to ICU.  10/16  OR: necrotic sigmoid and left colon resected, 40cm ileum with necrosis, 40cm proximal to the ileocecal valve, resected and left in discontinuity. Stomach dusky but without frank necrosis. R chest tube placed.  10/17  Bcx +ESBL Klebsiella pneumoniae, ID consulted.  10/18  Started on TPN, weaned off of pressors 10/19  Extubated to HFNC 10/21  Developed  recurrent hypoxic respiratory failure overnight, required reintubation this AM.  Was bronched afterward and significant purulent secretions were seen 10/23  Extubated 10/25  Reintubated, new fever, hypotension; UGIS and CT ? leak  Interval events She remains intubated.  Over weekend, noted to have large amount of bilious drainage from midline incision.  Discussed with wound nurse - has some breakdown around ostomy as well  Antimicrobials: Fever Curve and Antibiotics (Current Encounter) <redacted file path> Meropenem 10/16- Levofloxacin  10/22- Minocycline 10/26- Micafungin 10/25-  Prior: Micafungin 10/16-10/20 Vancomycin  10/21-10/23 TMP-SMX 10/25-10/26  Additional data (medical, surgical, family, social history, and med list) reviewed.  Objective  Physical Exam: Vitals:   11/10/23 0900  BP: 104/46  Pulse: 89  Resp: 16  Temp: 37.8 C (100 F)   Temp (24hrs), Avg:37.7 C (99.9 F), Min:37.5 C (99.5 F), Max:37.9 C (100.2 F)   PICC Double Lumen Non-Tunneled/Temporary 10/28/23 Right Brachial (13) CVC Triple Lumen Non-Tunneled/Temporary 11/08/23 Right Internal jugular (2) Urethral Catheter Temperature probe (2)  General: Chronically ill appearing female with temporal wasting, sedated Skin: No rashes seen.  HEENT:  No scleral icterus. ETT in place; OG with bilious output Cardiovascular: RRR, no murmurs Respiratory: coarse BS anteriorly Abdomen: Surgical dressing in midline; photos reviewed - green bilious drainage from midline wound that is now open; L JP drain with serosanguinous output; R sided ileostomy with liquid brown stool GU: foley in place draining yellow urine Extremities: + anasarca  R chest tube with serosanguinous output   Microbiology <redacted file path>: Micro data reviewed in the medical record Culture Data w/ Susceptibilities (1 year) <redacted file path>  10/15 - 1 of 2 BCx K pneumo Susceptibility   Klebsiella pneumoniae (Klebsiella pneumoniae  complex)    MIC   Amikacin S   Ampicillin R   Ampicillin + Sulbactam R  Cefazolin  R   Cefepime R   Ceftazidime R   Ceftriaxone  R   Ciprofloxacin  R   Ertapenem S   Gentamicin R   Meropenem S   Piperacillin/Tazobactam SEE COMMENT 1   Tobramycin R   Trimethoprim  + Sulfamethoxazole  R          1 This isolate shows an ESBL pattern of resistance. Pip/Tazo is likely effective for localized infections (e.g. UTI, wound), but carbapenem is preferred for more severe infections (e.g. bacteremia).      10/18 - 2 of 2 BCx no growth 10/19 - 2 of 2 BCx no growth 10/21 - BAL cultures in process, gram stain with GNRs Susceptibility   Pseudomonas aeruginosa Stenotrophomonas maltophilia   MIC MIC (Preliminary)  Amikacin S   Cefepime S   Ceftazidime S   Ciprofloxacin  S   Gentamicin S   Levofloxacin  S S 1  Meropenem I   Piperacillin/Tazobactam S   Tobramycin S   Trimethoprim  + Sulfamethoxazole    S   10/25 - BAL pending 10/25 - 2 of 2 BCx pending  MRSA nasal swab negative  Laboratory Studies <redacted file path>: Lab Results  Component Value Date   WBC 20.1 (H) 11/10/2023   HGB 8.2 (L) 11/10/2023   PLT 393 11/10/2023   Lab Results  Component Value Date   NA 138 11/10/2023   K 4.6 11/10/2023   CL 105 11/10/2023   CO2 29 11/10/2023   CO2 26 11/09/2023   BUN 19 11/10/2023   CREATININE 0.4 11/10/2023   CALCIUM 8.0 (L) 11/10/2023   ALKPHOS 78 11/10/2023   ALT 19 11/10/2023   AST 23 11/10/2023   GLUCOSE 98 11/10/2023   Estimated Creatinine Clearance: 43.1 mL/min (based on SCr of 0.4 mg/dL). - Adjusted Ideal Body Weight CrCl Range (Ideal BW, Actual BW): (107.7, 120.1) ml/min     EKG (QTc): Lab Results  Component Value Date   QTC 387 11/05/2023   QTC 435 10/30/2023   QTC 414 07/08/2023   Radiology <redacted file path>: Relevant data (if any) personally reviewed in [place]. Independent interpretation:   10/17 CXR Evaluation somewhat limited by patient rotation.    Dorsal spinal stimulator device noted. Right PICC line tip noted with tip not well demonstrated but extending at least into the superior vena cava. Probable right-sided chest tube.   Heart size stable. Mild scarring and/or subsegmental atelectasis suspected at the left lung base similar to prior. Negative for new focal pulmonary consolidation. Negative for definite pneumothorax.  CT PE protocol 10/22 IMPRESSION: 1. No pulmonary emboli demonstrated. 2. Tiny apical right pneumothorax. 3. Support apparatus as described. 4. Right pleural effusion no longer seen. Interval increase in a small left pleural effusion. Atelectasis in both lower lobes and to a lesser degree elsewhere. Additional heterogeneous opacities in the mid and lower lung fields. Underlying infectious process cannot be excluded.     Pathology <redacted file path>: Relevant data (if any) personally reviewed in [place].  Each new and available test documented above been reviewed at time of consult/follow-up. Assessment & Plan   Assessment:  ESBL Klebsiella pneumoniae bacteremia Roux-en-Y reversal c/b bowel necrosis  Hypoxic respiratory failure Hospital acquired pneumonia R pneumothorax, intra-op diaphragm injury Recurrent sepsis  She continues to do poorly from respiratory standpoint requiring re-intubation for 2nd time. Suspect this is multifactorial but her overall debility is contributing.  She has evidence of mucous plugging though last imaging did not show significant parenchymal lung disease.  I had been holding off on tmp-smx for  Stenotrophomonas in this setting given concerns regarding toxicity of tmp-smx and because I am not sure what role steno is playing.  With her recurrent intubation, will plan to complete a course for the PsA and Stenotrophomonas.  Overall, there has been breakdown of her midline wound with bilious fluid concerning for leak from her UGI tract and or liver.  Repeat CT scan is pending to  evaluate for any drainable collections.  Until we feel we have source control, will continue meropenem/micafungin.    Recommendations: - continue meropenem 500mg  IV every 8 hours - stop date pending abdominal source control - continue micafungin 100 mg IV q 24 hours - stop date pending abdominal source control - continue levofloxacin  750 q 24 hours - stop 11/1 - continue minocycline 100 mg PO q 12 hours - stop 11/1  - will follow-up CT scan and clinical course  Overall prognosis is guarded  ID will continue to follow Probable need for outpatient ID f/u: Unclear at this time  Please page [facility] ID Team 1 [phone]) with questions or change in clinical status.  Infectious Diseases is available in-house from 8A-5P. If urgent assistance is needed outside of these hours, please page the Infectious Diseases Team Pager assigned to the patient for assistance. The Infectious Diseases consult pagers are available after business hours for emergencies only.  Any IV antimicrobials listed above require regular monitoring for toxicity. Recommendations as documented above and interpretation of above test results discussed with primary service.  Lauraine Rosalie Kerns, MD

## 2023-11-11 ENCOUNTER — Telehealth: Payer: Self-pay

## 2023-11-11 NOTE — Telephone Encounter (Signed)
 Prior Prolia  05/21/2023. Patient due for Prolia  11/17/2023.  Last appointment 03/04/2023 with Dr. Dolphus.  Currently inpatient at [facility] with potential plans for discharge to SNF as of 11/11/23. Will continue to follow-up for Prolia  scheduling when appropriate.  Powell Gallus, PharmD, MPH Pharmacy Resident

## 2023-11-14 NOTE — Anesthesia Postprocedure Evaluation (Signed)
 Discharge from Anesthesia Care  Patient: Kayla [name]  Procedures performed: ESOPHAGOGASTRODUODENOSCOPY, FLEXIBLE, TRANSORAL; WITH PLACEMENT OF ENDOSCOPIC STENT (INCLUDES PRE- AND POST-DILATION AND GUIDE WIREPASSAGE, WHEN PERFORMED)  Anesthesia type: general Vitals Value Taken Time  BP    Temp 35.7 C (96.26 F) 11/14/23 17:27  Pulse 73 11/14/23 17:27  Resp    SpO2 99 % 11/14/23 17:27  Vitals shown include unfiled device data. *If vital value is absent from grid, please refer to corresponding flowsheet vitals data  Anesthesia Post Evaluation  Patient participation: complete - patient participated Level of consciousness: awake and alert Pain management: adequate Airway patency: patent Respiratory status: acceptable Cardiovascular status: acceptable Hydration status: acceptable   Recent Flowsheet Information: Vitals Value Taken Time  Respiratory (WDL)    Respiratory Pattern    Musculoskeletal (WDL)    Neuro (WDL)    Level of Consciousness    Orientation Level    Pain Assessment %% Critical-Care Pain Observation Tool 11/14/23 16:00  Pain Score    Wong-Baker FACES Pain Rating    Nausea Status    Vomiting Status    Gastrointestinal (WDL)    GI Symptoms

## 2023-11-17 NOTE — Progress Notes (Signed)
  [facility] NIGHT ROUNDS   Date of Service: 11/17/2023   VITALS: BP 90/47   Pulse (!) 112   Temp (!) 38.2 C (100.8 F)   Resp 15   Ht 165.1 cm (5' 5)   Wt 67.6 kg (149 lb 0.5 oz)   LMP  (LMP Unknown)   SpO2 98%   BMI 24.80 kg/m   LABS: Na/K/Cl/CO2/BUN/Cr:  144/4.5/113/28/25/0.3 (11/03 9156) WBC/Hgb/Hct/Plts:  12.4/7.7/26.3/408 (11/03 0843)       I/O's  Intake/Output Summary (Last 24 hours) at 11/17/2023 2139 Last data filed at 11/17/2023 1910 Gross per 24 hour  Intake 3143.16 ml  Output 3275 ml  Net -131.84 ml     Continuous Infusions: . dextrose     . NOREPINephrine in sodium chloride  0.9% Stopped (11/17/23 1435)  . tube feeding 40 mL/hr at 11/17/23 1800    Current Ventilator/Oxygen settings: O2 Device: Ventilator Vent Mode: (S) PS FiO2 (%)  Avg: 45.8 %  Min: 30 %  Max: 100 %     Severe protein-calorie malnutrition (HHS-HCC)   Abnormal loss of weight   Neuropathic pain   Memory impairment   Malnutrition (HHS-HCC)   Major depressive disorder, recurrent episode ()   Anxiety state   Fibromyalgia   Postsurgical malabsorption (HHS-HCC)   History of Roux-en-Y gastric bypass   Status post exploratory laparotomy   Jejunostomy tube present (CMS/HHS-HCC)   Thrombocytopenia ()   Pneumothorax on right   Ischemic bowel disease (HHS-HCC)   Pain   Acute and chronic respiratory failure with hypercapnia (CMS/HHS-HCC)    I spent 40 minutes in performing critical care.  The patient required critical care services due to issues above and below and the threat to imminent deterioration of this condition.   Critical care time reflects my personal work performing the following specific actions at the patient's bedside and reviewing the plan of care with the multidisciplinary team:  #peritonitis  with septic shock - resolving  #acute post-surgical pain #gastric leak with gastroesophageal stent placement on 10/31 #post-operative respiratory failure, on mechanical ventilation -started methadone  today, stopped ketamine -patient hav ing more pain tonight, will switch fentanyl  PRN for breakthrough to hydromorphone  -SBT tonight, then will rest on vent rest of night  -febrile tonight, will reculture Abd drain output -if clinically decompensates, consdier repeat CT Abd -weaned off norepinephrine, titrate for MAP > 65    Critical care management included active management of invasive ventilation, assessment and interpretation of ongoing invasive hemodynamic monitoring, management of unstable hemodynamics with  pressors and IV fluid infusion or boluses, and management of sedative infusions.  See chart documentation for additional detail Additional activities performed during the critical care time included: [x]   Review of recent events [x]   Review of medications, allergies, and vital signs  [x]   Serial data review [x]   Ordering, interpreting, and reviewing diagnostic studies/lab tests [x]   High complexity medical decision-making  This time also includes time spent for documentation. This time does not include time spent in performing any separately billed procedures.   Attestation Statement:   I personally performed the service. (TP)  LYNWOOD WADDELL IP, MD

## 2023-11-17 NOTE — Progress Notes (Signed)
 INFECTIOUS DISEASES CONSULTATION NOTE   Admit Date: 10/27/2023 Date of Initial Consult: 10/31/2023 Date of Follow-Up: 11/17/2023   Service Requesting Consult: Intensive Care Attending Physician requesting Consult: Kayla Verla Caldron, MD Reason for consult: K.pneumoniae bacteremia Current Hospital admission day: Hospital Day: 22  SUBJECTIVE   HISTORY OF PRESENT ILLNESS: History was obtained from Chart Review and Patient Interview Kayla [name] is a 78 y.o. female w/ T2DM, Mitral valve prolapse, osteoporosis, DDD, chronic back pain s/p spinal stimulator, Barrett's esophagus s/p Roux-en-Y 1999 c/b malabsorption, esophageal dysphagia requiring periodic dilation, osteoporosis, who was admitted to [facility] on 10/27/2023 for planned reversal of Roux-en-Y. Hospital course has been complicated by K.pneumoniae bacteremia and hypoxemic respiratory failure.   RELEVANT COURSE: 07/04/2023 - 07/11/2023: Admitted for PEG placement, TF initiated 07/14/2023 - 07/19/2023: Abdominal wall abscess requiring debridement, no cultures obtained, discharged on cipro  and metronidazole . 10/27/2023: OR for planned reversal of her Roux-en-Y. Dense adhesions found throughout the abdomen, gastrostomy tube removed, jejunostomy tube placed into the roux limb, NGT placed distal to GG anastomosis, drains left in place.  10/28/2023: PICC placed for TPN initiation. Worsening tachycardia, change in NGT output from dark brown to dark red. CT A/P demonstrated pneumatosis of small bowel with portal vein gas. 10/29/2023: Tachycardia, hypotension, lactic acidosis. OR for ex-lap: dusky appearing stomach, small bowel and colon, no overt necrosis. Palpable SMA pulse. Bilious leak from previously cauterized liver edge, 5cm R anterior diaphragmatic defect from dense adhesions to liver, EGD with dusky stomach without apparent leak. Abdomen left open for second look. Admitted to ICU.  10/30/2023: OR: necrotic sigmoid and left colon resected,  40cm ileum with necrosis, 40cm proximal to the ileocecal valve, resected and left in discontinuity. Stomach dusky but without frank necrosis. R chest tube placed.  10/31/2023: BCx from 10/15 w/ ESBL K.pneumoniae, ID consulted.  11/01/2023: Started on TPN, weaned off of pressors. BCx 1/2 K.pneumoniae. 11/02/2023: Extubated to HFNC. BCx 2/2 no growth.  11/04/2023: Developed recurrent hypoxic respiratory failure overnight, required reintubation. Was bronched afterward and significant purulent secretions were seen. BCx no growth.  11/06/2023: Extubated 11/08/2023: Reintubated, new fever, hypotension; UGIS and CT ? Leak. BAL Cx w/ P.aeruginosa, S.maltophilia. BCx 2/2 no growth.  10/29/2025[name], EGD shows < 1 cm defect in stomach with surrounding ischemic changes.  11/13/2023: J tube placed.  11/14/2023: EGD w/ covered stent over leak. Wound vac placed in OR. LUQ fluid drained by IR.   Afebrile, WBC stable. Norepi paused.  Vented via trach.   CURRENT ANTIBIOTICS: Fever Curve and Antibiotics (Current Encounter) <redacted file path> Meropenem 10/30/2023 - present Micafungin 11/08/2023 - present Levofloxacin  11/05/2023 - 11/15/2023 Minocycline 11/09/2023 - 11/15/2023   Prior: Micafungin 10/30/2023 - 11/03/2023 Vancomycin  11/04/2023 - 11/06/2023 TMP-SMX 11/08/2023 - 11/09/2023  REVIEW OF SYSTEMS:  A ROS was performed including pertinent positive and negatives as documented.  All other systems are negative.  MEDICAL HISTORY: Patient Active Problem List  Diagnosis  . Abnormal loss of weight  . Chronic constipation  . Vitamin D  deficiency  . Peripheral polyneuropathy  . Osteoporosis  . Neuropathic pain  . Mitral regurgitation  . MVP (mitral valve prolapse)  . Memory impairment  . Malnutrition (HHS-HCC)  . Major depressive disorder, recurrent episode ()  . Macular degeneration  . Iron deficiency anemia due to dietary causes  . Anxiety state  . Chronic narcotic dependence  (CMS/HHS-HCC)  . Chronic kidney disease  . Fibromyalgia  . Calculus of kidney  . Chronic pain syndrome  . Esophageal dysphagia  .  Postsurgical malabsorption (HHS-HCC)  . History of type 2 diabetes mellitus  . History of Roux-en-Y gastric bypass  . Excessive weight loss  . Reactive hypoglycemia  . Hypoglycemia after GI (gastrointestinal) surgery (HHS-HCC)  . Adjustment disorder with depressed mood  . Barrett's esophagus  . Primary osteoarthritis of both knees  . Recurrent UTI  . Gastrostomy tube dependent (CMS/HHS-HCC)  . Severe protein-calorie malnutrition (HHS-HCC)  . Dislodged gastrostomy tube  . G tube feedings (CMS/HHS-HCC)  . Spinal cord stimulator status  . Status post exploratory laparotomy  . Jejunostomy tube present (CMS/HHS-HCC)  . Thrombocytopenia ()  . Pneumothorax on right  . Ischemic bowel disease (HHS-HCC)  . Pain  . Acute and chronic respiratory failure with hypercapnia (CMS/HHS-HCC)   Past Medical History:  Diagnosis Date  . Anorexia nervosa (CMS/HHS-HCC)   . Arthritis   . Barrett's esophagus 2014  . Cervical facet joint syndrome 2018  . Chronic pain syndrome 2018  . CKD (chronic kidney disease) 03/2021  . Diaphragmatic hernia 03/18/2007  . Dysphagia   . Esophageal dysphagia 2009  . Fibromyalgia   . GAD (generalized anxiety disorder)   . GERD (gastroesophageal reflux disease)   . H/O hyperparathyroidism   . History of kidney stones   . Hypoglycemia   . IBS (irritable bowel syndrome)   . Malnutrition (HHS-HCC)   . MDD (major depressive disorder)   . MVP (mitral valve prolapse)   . Neuropathy   . Osteoporosis   . PTSD (post-traumatic stress disorder) 2014  . Spondylosis without myelopathy or radiculopathy, cervical region 2020  . Stomach ulcer 2020   Past Surgical History:  Procedure Laterality Date  . Spintal Cord Stimulator Trial  02/13/2018  . Spinal Cord Stimulator Implant  03/20/2018  . OTHER SURGERY  11/16/2018   REVISION SPINAL CORD  STIMULATOR LEAD W/ THORACIC LAMINECTOMY AND REVISION INTERNAL PULSE GENERATOR  . EGD N/A 12/18/2021   Procedure: ESOPHAGOGASTRODUODENOSCOPY, FLEXIBLE, TRANSORAL; DIAGNOSTIC, INCLUDING COLLECTION OF SPECIMEN(S) BY BRUSHING OR WASHING, WHEN PERFORMED (SEPARATE PROCEDURE);  Surgeon: Rosalina Levorn Dragon, DO;  Location: [facility] OR;  Service: General Surgery;  Laterality: N/A;  . ESOPHAGOGASTRODOUDENOSCOPY W/BIOPSY N/A 04/16/2023   Procedure: ESOPHAGOGASTRODUODENOSCOPY, FLEXIBLE, TRANSORAL; WITH BIOPSY, SINGLE OR MULTIPLE;  Surgeon: Russella Missy Bers, MD;  Location: [facility] ENDO/BRONCH;  Service: Gastroenterology;  Laterality: N/A;  . COLONOSCOPY W/REMOVAL LESIONS BY SNARE N/A 05/05/2023   Procedure: COLONOSCOPY, FLEXIBLE; WITH REMOVAL OF TUMOR(S), POLYP(S), OR OTHER LESION(S) BY SNARE TECHNIQUE;  Surgeon: Rudolph Rima, MD;  Location: [facility] ENDO/BRON[facility];  Service: Gastroenterology;  Laterality: N/A;  . GASTROSTOMY N/A 07/04/2023   Procedure: GASTROSTOMY, OPEN; WITHOUT CONSTRUCTION OF GASTRIC TUBE (EG, STAMM PROCEDURE) (SEPARATE PROCEDURE), possible laparoscopy;  Surgeon: Rosalina Levorn Dragon, DO;  Location: [facility] OR;  Service: General Surgery;  Laterality: N/A;  . EGD N/A 07/04/2023   Procedure: ESOPHAGOGASTRODUODENOSCOPY, FLEXIBLE, TRANSORAL; DIAGNOSTIC, INCLUDING COLLECTION OF SPECIMEN(S) BY BRUSHING OR WASHING, WHEN PERFORMED (SEPARATE PROCEDURE);  Surgeon: Rosalina Levorn Dragon, DO;  Location: [facility] OR;  Service: General Surgery;  Laterality: N/A;  . REVISION GASTROPLASTY FOR MORBID OBESITY N/A 10/27/2023   Procedure: REVISION, OPEN, OF GASTRIC RESTRICTIVE PROCEDURE FOR MORBID OBESITY, OTHER THAN ADJUSTABLE GASTRIC RESTRICTIVE DEVICE (SEPARATE PROCEDURE);  Surgeon: Rosalina Levorn Dragon, DO;  Location: [facility] OR;  Service: General Surgery;  Laterality: N/A;  . EGD N/A 10/27/2023   Procedure: ESOPHAGOGASTRODUODENOSCOPY, FLEXIBLE, TRANSORAL; DIAGNOSTIC, INCLUDING COLLECTION OF SPECIMEN(S) BY BRUSHING OR WASHING, WHEN PERFORMED  (SEPARATE PROCEDURE);  Surgeon: Rosalina Levorn Dragon, DO;  Location: [facility] OR;  Service: General Surgery;  Laterality: N/A;  . EXPLORATORY LAPAROTOMY N/A 10/29/2023   Procedure: EXPLORATORY LAPAROTOMY, EXPLORATORY CELIOTOMY WITH OR WITHOUT BIOPSY(S) (SEPARATE PROCEDURE);  Surgeon: Rosalina Levorn Dragon, DO;  Location: [facility] OR;  Service: General Surgery;  Laterality: N/A;  . EXPLORATORY LAPAROTOMY N/A 10/30/2023   Procedure: EXPLORATORY LAPAROTOMY, EXPLORATORY CELIOTOMY WITH OR WITHOUT BIOPSY(S) (SEPARATE PROCEDURE);  Surgeon: Rosalina Levorn Dragon, DO;  Location: [facility] OR;  Service: General Surgery;  Laterality: N/A;  . MOBILIZATION SPLENIC FLEXURE W/COLECTOMY PARTIAL N/A 10/30/2023   Procedure: MOBILIZATION (TAKE-DOWN) OF SPLENIC FLEXURE PERFORMED IN CONJUNCTION WITH PARTIAL COLECTOMY (LIST IN ADDITION TO PRIMARY PROCEDURE);  Surgeon: Rosalina Levorn Dragon, DO;  Location: [facility] OR;  Service: General Surgery;  Laterality: N/A;  . REPAIR DIAPHRAGMATIC LACERATION Right 10/30/2023   Procedure: REPAIR DIAPHRAGMATIC LACERATION;  Surgeon: Rosalina Levorn Dragon, DO;  Location: [facility] OR;  Service: General Surgery;  Laterality: Right;  . EXPLORATORY LAPAROTOMY N/A 10/31/2023   Procedure: EXPLORATORY LAPAROTOMY, EXPLORATORY CELIOTOMY WITH OR WITHOUT BIOPSY(S) (SEPARATE PROCEDURE);  Surgeon: Rosalina Levorn Dragon, DO;  Location: [facility] OR;  Service: General Surgery;  Laterality: N/A;  . TRACHEOSTOMY ADULT N/A 11/12/2023   Procedure: TRACHEOSTOMY, PLANNED (SEPARATE PROCEDURE);  Surgeon: Wilmer Aleene Skates, MD;  Location: [facility] OR;  Service: General Surgery;  Laterality: N/A;  . EGD N/A 11/12/2023   Procedure: ESOPHAGOGASTRODUODENOSCOPY, FLEXIBLE, TRANSORAL; DIAGNOSTIC, INCLUDING COLLECTION OF SPECIMEN(S) BY BRUSHING OR WASHING, WHEN PERFORMED (SEPARATE PROCEDURE);  Surgeon: Rosalina Levorn Dragon, DO;  Location: [facility] OR;  Service: General Surgery;  Laterality: N/A;  . APPENDECTOMY    . bariatric surgery    . COLONOSCOPY    . DILATION AND  CURETTAGE, DIAGNOSTIC / THERAPEUTIC    . EGD     Multiple per patient  . EXPLORATORY LAPAROTOMY    . HYSTERECTOMY    . KNEE ARTHROSCOPY Right   . ORIF Wrist Right    for fracture  . OTHER SURGERY     Right retinal eye surgery  . PHOTOREFRACTIVE KERATOTOMY/LASIK Bilateral    Bilateral  . TONSILLECTOMY     Allergies  Allergen Reactions  . Codeine Anaphylaxis    Other reaction(s): Anaphylaxis  TOLERATES MORPHINE  . Oxycodone-Acetaminophen  Anaphylaxis and Shortness Of Breath    Chest pain    . Zolpidem Anaphylaxis, Hallucination and Other (See Comments)    Other reaction(s): Other, hallucinations, Other reaction(s): Other, hallucinations, hallucinations  Other Reaction(s): Hallucination    Other reaction(s): Other, hallucinations, Other reaction(s): Other, hallucinations, hallucinations  . Ace Inhibitors Other (See Comments)  . Hydrocodone -Acetaminophen  Other (See Comments)    Severe headache Chest pain Must keep under 7-325 mg to keep in check   . Opium Hallucination and Other (See Comments)    hallucinations  . Other Nausea And Vomiting  . Penicillins Nausea And Vomiting and Other (See Comments)    Reaction:  Migraine headache  Other reaction(s): Nausea And Vomiting, Reaction:  Migraine headache, Reaction:  Migraine headache  Migraine Headaches  . Propoxyphene N-Acetaminophen  Nausea And Vomiting    Chest pain  . Acetaminophen -Codeine Other (See Comments)  . Celecoxib  Other (See Comments)    Stomach cramping  . Doxycycline Vomiting  . Pollen Extracts Other (See Comments)  . Propoxyphene-Acetaminophen  Nausea And Vomiting and Other (See Comments)    Chest pain  . Propoxyphene Itching    Other reaction(s): Itching   Family History  Problem Relation Age of Onset  . Lung cancer Mother   . Breast cancer Mother   . Brain cancer Mother   . High blood pressure (Hypertension) Mother   .  Breast cancer Sister   . Esophageal cancer Sister   . Lung cancer Sister   .  Pancreatic cancer Sister   . Coronary Artery Disease (Blocked arteries around heart) Sister   . Diabetes type II Sister    SOCIAL HISTORY: Social History   Socioeconomic History  . Marital status: Married  Tobacco Use  . Smoking status: Never  . Smokeless tobacco: Never  Vaping Use  . Vaping status: Never Used  Substance and Sexual Activity  . Alcohol  use: Not Currently    Comment: stopped  . Drug use: Never  . Sexual activity: Not Currently    Partners: Male    Birth control/protection: Post-menopausal    Comment: intimate with husband but no sexual intercourse   Social Drivers of Corporate Investment Banker Strain: Low Risk  (09/19/2023)   Overall Financial Resource Strain (CARDIA)   . Difficulty of Paying Living Expenses: Not hard at all  Food Insecurity: No Food Insecurity (09/19/2023)   Hunger Vital Sign   . Worried About Programme Researcher, Broadcasting/film/video in the Last Year: Never true   . Ran Out of Food in the Last Year: Never true  Transportation Needs: No Transportation Needs (10/28/2023)   PRAPARE - Transportation   . Lack of Transportation (Medical): No   . Lack of Transportation (Non-Medical): No  Recent Concern: Transportation Needs - Unmet Transportation Needs (09/19/2023)   PRAPARE - Transportation   . Lack of Transportation (Medical): Yes   . Lack of Transportation (Non-Medical): No  Physical Activity: Inactive (11/22/2022)   Exercise Vital Sign   . Days of Exercise per Week: 0 days   . Minutes of Exercise per Session: 0 min  Stress: Stress Concern Present (11/22/2022)   [employer] of Occupational Health - Occupational Stress Questionnaire   . Feeling of Stress : Very much  Social Connections: Moderately Integrated (11/22/2022)   Social Connection and Isolation Panel   . Frequency of Communication with Friends and Family: More than three times a week   . Frequency of Social Gatherings with Friends and Family: Once a week   . Attends Religious Services: More than 4  times per year   . Active Member of Clubs or Organizations: No   . Attends Banker Meetings: Never   . Marital Status: Married  Housing Stability: Low Risk  (09/19/2023)   Housing Stability Vital Sign   . Unable to Pay for Housing in the Last Year: No   . Number of Times Moved in the Last Year: 0   . Homeless in the Last Year: No  Recent Concern: Housing Stability - High Risk (07/04/2023)   Housing Stability Vital Sign   . Unable to Pay for Housing in the Last Year: Yes   . Number of Times Moved in the Last Year: 0   . Homeless in the Last Year: No   CURRENT MEDICATIONS: Medication List & Administration Record reviewed on rounds Current Facility-Administered Medications  Medication Dose Route Frequency Provider Last Rate Last Admin  . [Held by provider] acetaminophen  (TYLENOL ) 160 mg/5 mL oral LIQUID 650 mg  650 mg Via Tube Q6H SCH Waynetta Recardo Caldron, PA   650 mg at 11/15/23 1729  . Adult TPN   Intravenous Continuous TPN Jennine Border, MD 63 mL/hr at 11/17/23 0700 Rate Verify at 11/17/23 0700  . [Held by provider] albuterol  (PROVENTIL ) 2.5 mg /3 mL (0.083 %) nebulizer solution 2.5 mg  2.5 mg Nebulization QID RT Doretha Prentice Rush, MD  2.5 mg at 11/07/23 2003  . amino acids-protein hydrolys (PROSOURCE TF 20) 20 gram-80 kcal/60 mL 60 mL  1 packet J Tube Q24H Leroy Market, MD   60 mL at 11/17/23 1049  . Calcium Adjustment Protocol (ICU ONLY)   XX As Directed Admin Doretha Prentice Rush, MD      . dextrose  10 % infusion   Intravenous Continuous PRN Doretha Prentice Rush, MD      . dextrose  10 % infusion  50 mL/hr Intravenous As Directed Cesario Hoff, MD      . dextrose  50% in water solution 12.5-25 g  12.5-25 g Intravenous As Directed Doretha Prentice Rush, MD      . enoxaparin (LOVENOX) 40 mg/0.4 mL inj syringe 40 mg  40 mg Subcutaneous Daily Doretha Prentice Rush, MD   40 mg at 11/17/23 0846  . fentaNYL  (PF) (SUBLIMAZE ) injection 25 mcg  25 mcg Intravenous Q1H PRN  Cesario Hoff, MD   25 mcg at 11/17/23 1604  . glucagon  (GLUCAGEN) injection 1 mg  1 mg Subcutaneous As Directed Doretha Prentice Rush, MD      . insulin  REGULAR (HumuLIN R ) injection correction dose 0-12 Units  0-12 Units Subcutaneous Q6H [facility] Doretha Prentice Rush, MD   2 Units at 11/09/23 1229  . ipratropium-albuteroL  (DUO-NEB) nebulizer solution 3 mL  3 mL Nebulization QID RT Doretha Prentice Rush, MD   3 mL at 11/17/23 1237  . ketamine (KETALAR) 500 mg in sodium chloride  0.9 % 250 mL infusion  0.35 mg/kg/hr Intravenous Continuous Raghunathan, Karthik, MD 11.8 mL/hr at 11/17/23 0700 0.35 mg/kg/hr at 11/17/23 0700  . Magnesium  Adjustment Protocol (ICU ONLY)   XX As Directed Admin Doretha Prentice Rush, MD      . menthol-camphor-dimeth-phenol [facility] MEDEX) lip ointment   Topical Q2H PRN Doretha Prentice Rush, MD   Given at 11/17/23 0203  . meropenem (MERREM) 500 mg in sodium chloride  0.9 % 100 mL IVPB  500 mg Intravenous Q8H Doretha Prentice Rush, MD 200 mL/hr at 11/17/23 1531 500 mg at 11/17/23 1531  . methadone  (DOLOPHINE ) 5 mg/5 mL solution 10 mg  10 mg Via Tube Q8H SCH Cesario Hoff, MD   10 mg at 11/17/23 1531  . micafungin (MYCAMINE) 100 mg in sodium chloride  0.9 % 100 mL IVPB  100 mg Intravenous Q24H Doretha Prentice Rush, MD 100 mL/hr at 11/16/23 1613 100 mg at 11/16/23 1613  . NOREPINephrine (LEVOPHED) in sodium chloride  0.9% infusion 16 mg/250 mL  0.01-1 mcg/kg/min Intravenous Continuous Doretha Prentice Rush, MD   Stopped at 11/17/23 1435  . omeprazole (PRILOSEC) 2 mg/mL oral suspension 40 mg  40 mg Oral BID Cesario Hoff, MD      . PAIN-AGITATION-DELIRIUM PROTOCOL 1 each  1 each XX PAD ASSESSMENT Doretha Prentice Rush, MD      . Phosphorus Adjustment Protocol (ICU ONLY)   XX As Directed Admin Doretha Prentice Rush, MD      . Potassium Adjustment Protocol (ICU ONLY)   XX As Directed Admin Doretha Prentice Rush, MD      . sodium chloride  3 % nebulizer solution 3 mL  3 mL Nebulization  QID RT Doretha Prentice Rush, MD   3 mL at 11/17/23 1237  . tube feeding (PEPTAMEN 1.5) liquid   J Tube Continuous Wang, Anke, MD 40 mL/hr at 11/17/23 1044 Restarted at 11/17/23 1044   PHYSICAL EXAM   PHYSICAL EXAMINATION: Vitals:   11/17/23 1506  BP: 90/47  Pulse: 108  Resp:   Temp: 37.7 C (  99.9 F)   Temp (24hrs), Avg:37.5 C (99.5 F), Min:37.3 C (99.1 F), Max:37.8 C (100 F)  Ht Readings from Last 1 Encounters:  11/08/23 165.1 cm (5' 5)   Wt Readings from Last 1 Encounters:  11/14/23 67.6 kg (149 lb 0.5 oz)   I/O last 3 completed shifts: In: 5420.4 [I.V.:676.3; Blood:100; NG/GT:270; IV Piggyback:850] Out: 4650 [Urine:2375; Drains:1495; Stool:780]  Physical Exam Constitutional:      Comments: Sedated Trach in place  Cardiovascular:     Rate and Rhythm: Tachycardia present.     Heart sounds: No murmur heard. Pulmonary:     Comments: Coarse breath sounds Abdominal:     Comments: Midline wound vac in place G tube in place, no surrounding erythema  Two LUQ drains in place - one with clear serous fluid, other with slightly murky/brownish fluid     LINES/DRAINS/TUBES: Patient Lines/Drains/Airways Status     Active PICC Line / CVC Line / PIV Line / Drain / Intraosseous Line / ART Line / Line / NG/OG Tube     Name Placement date Placement time Site Days   PICC Double Lumen Non-Tunneled/Temporary 10/28/23 Right Brachial 10/28/23  1212  Brachial  20   CVC Triple Lumen Non-Tunneled/Temporary 11/08/23 Right Internal jugular 11/08/23  2345  -- 9   Closed/Suction Drain Blake 1 Left;Lower Abdomen 19 Fr. 10/31/23  1841  Abdomen  17   Closed/Suction Drain Left;Upper Abdomen 12 Fr. 11/14/23  1202  Abdomen  3   Negative Pressure Wound Therapy Mid Abdomen 11/14/23  1525  Abdomen  3   Gastrostomy/ Enterostomy Single Lumen Jejunostomy LLQ EnFit 11/13/23  1513  LLQ  4   Ileostomy Loop RLQ 10/31/23  1840  RLQ  17   Urethral Catheter Temperature probe 11/16/23  1000  Temperature  probe  1   Arterial Line 11/12/23 Right Axillary 11/12/23  1541  Axillary  5           OBJECTIVE   LABORATORY STUDIES: Lab Results  Component Value Date   WBC 12.4 (H) 11/17/2023   HGB 7.7 (L) 11/17/2023   PLT 408 11/17/2023   Lab Results  Component Value Date   NA 144 11/17/2023   K 4.5 11/17/2023   CL 113 (H) 11/17/2023   CO2 28 11/17/2023   CO2 27 11/17/2023   BUN 25 (H) 11/17/2023   CREATININE 0.3 (L) 11/17/2023   CALCIUM 8.2 (L) 11/17/2023   ALKPHOS  11/17/2023     Comment:     SIGNIFICANT HEMOLYSIS, NO RESULT   ALT  11/17/2023     Comment:     SIGNIFICANT HEMOLYSIS, NO RESULT   AST  11/17/2023     Comment:     SIGNIFICANT HEMOLYSIS, NO RESULT   GLUCOSE 137 11/17/2023   Estimated Creatinine Clearance: 43.1 mL/min (A) (based on SCr of 0.3 mg/dL (L)). - Adjusted Ideal Body Weight CrCl Range (Ideal BW, Actual BW): (143.6, 170.3) ml/min     MICROBIOLOGY: Culture Data w/ Susceptibilities (1 year) <redacted file path> Relevant data (if any) personally reviewed and interpreted in [place].  PATHOLOGY: Relevant data (if any) personally reviewed and interpreted in [place].  EKG (QTc): Lab Results  Component Value Date   QTC 403 11/17/2023   QTC 387 11/05/2023   QTC 435 10/30/2023    IMAGING/RADIOLOGY: Relevant data (if any) personally reviewed and interpreted in [place].  Each new and available test documented above been reviewed at time of consult/follow-up. ASSESSMENT & RECOMMENDATIONS   ASSESSMENT: Marrianne Sica is  a 78 y.o. female w/ T2DM, Mitral valve prolapse, osteoporosis, DDD, chronic back pain s/p spinal stimulator, Barrett's esophagus s/p Roux-en-Y 1999 c/b malabsorption, esophageal dysphagia requiring periodic dilation, osteoporosis, who was admitted to [facility] on 10/27/2023 for planned reversal of Roux-en-Y. Hospital course has been complicated by K.pneumoniae bacteremia and hypoxemic respiratory failure.   Regarding K.pneumoniae bacteremia  ESBL  K.pneumoniae from GI source given multiple recent procedures and concern for necrosis of bowel and stomach, and suspected leak from esophago-gastric junction. BCx have cleared, hemodynamics are improving. She remains on meropenem since 10/30/2023 pending her clinical course. I think now that the suspected leak has been addressed and there is no concern for ongoing leak at this moment, if she continues to do well and output from drains decreases in the next few days we can discontinue therapy.   Regarding esophageal leak Currently on meropenem and empiric micafungin. I think now that the suspected leak has been addressed and there is no concern for ongoing leak at this moment, if she continues to do well and output from drains decreases in the next few days we can discontinue therapy.   Regarding HAP/VAP BAL Cx w/ P.aeruginosa & S.maltophilia - in the context of thick secretions. Completed therapy w/ levofloxacin  and minocycline through 11/15/2023. Stable vent support.   RECOMMENDATIONS: - OK to continue meropenem and micfungin for now - Will re-assess need for continued antibiotic therapy over the next few days   ID will continue to follow Please page [facility] ID Team 1 [phone]) with questions or change in clinical status. PROBABLE NEED FOR OUTPATIENT ID FOLLOW-UP: Unclear at this time  Infectious Diseases is available in-house from 8A-5P. If urgent assistance is needed outside of these hours, please page the Infectious Diseases Team Pager assigned to the patient for assistance. The Infectious Diseases consult pagers are available after business hours for emergencies only.  Any IV antimicrobials listed above require regular monitoring for toxicity. Recommendations as documented above and interpretation of above test results discussed with primary service.  Attestation Statement:   I personally performed the service. (TP)  Dominique Palmer, MD Assistant Professor of Medicine Division of  Infectious Diseases Pager: [phone] November 17, 2023 4:09 PM

## 2023-11-18 NOTE — Consults (Signed)
 VAST Note: Routine PICC dressing change. Insertion site without redness, swelling, bleeding, drainage, or pain at site per pt. Old dressing was removed and new CHG gel dressing applied. Rymed caps changed, lines easy to flush with brisk blood return noted. External cath at 1 cm. Arm circ 31 cm. PICC is OK to use.

## 2023-12-02 ENCOUNTER — Other Ambulatory Visit (HOSPITAL_COMMUNITY): Payer: Self-pay

## 2023-12-02 ENCOUNTER — Other Ambulatory Visit: Payer: Self-pay

## 2023-12-16 ENCOUNTER — Encounter: Admitting: Neurology

## 2023-12-16 ENCOUNTER — Encounter: Payer: Self-pay | Admitting: Neurology

## 2023-12-22 ENCOUNTER — Other Ambulatory Visit

## 2024-04-26 ENCOUNTER — Ambulatory Visit: Admitting: Neurology
